# Patient Record
Sex: Male | Born: 1935 | Race: White | Hispanic: No | Smoking: Former smoker
Health system: Southern US, Community
[De-identification: ages and names within clinical notes are randomized; demographics above are authoritative.]

## PROBLEM LIST (undated history)

## (undated) DIAGNOSIS — M1990 Unspecified osteoarthritis, unspecified site: Secondary | ICD-10-CM

## (undated) DIAGNOSIS — E1140 Type 2 diabetes mellitus with diabetic neuropathy, unspecified: Secondary | ICD-10-CM

## (undated) DIAGNOSIS — I509 Heart failure, unspecified: Secondary | ICD-10-CM

## (undated) DIAGNOSIS — N400 Enlarged prostate without lower urinary tract symptoms: Secondary | ICD-10-CM

## (undated) DIAGNOSIS — C4491 Basal cell carcinoma of skin, unspecified: Secondary | ICD-10-CM

## (undated) DIAGNOSIS — E119 Type 2 diabetes mellitus without complications: Secondary | ICD-10-CM

## (undated) DIAGNOSIS — D693 Immune thrombocytopenic purpura: Secondary | ICD-10-CM

## (undated) DIAGNOSIS — I2510 Atherosclerotic heart disease of native coronary artery without angina pectoris: Secondary | ICD-10-CM

## (undated) DIAGNOSIS — I10 Essential (primary) hypertension: Secondary | ICD-10-CM

## (undated) DIAGNOSIS — N189 Chronic kidney disease, unspecified: Secondary | ICD-10-CM

## (undated) DIAGNOSIS — J189 Pneumonia, unspecified organism: Secondary | ICD-10-CM

## (undated) DIAGNOSIS — E785 Hyperlipidemia, unspecified: Secondary | ICD-10-CM

## (undated) HISTORY — DX: Type 2 diabetes mellitus without complications: E11.9

## (undated) HISTORY — DX: Benign prostatic hyperplasia without lower urinary tract symptoms: N40.0

## (undated) HISTORY — PX: TONSILLECTOMY: SUR1361

## (undated) HISTORY — DX: Immune thrombocytopenic purpura: D69.3

## (undated) HISTORY — DX: Chronic kidney disease, unspecified: N18.9

## (undated) HISTORY — DX: Type 2 diabetes mellitus with diabetic neuropathy, unspecified: E11.40

## (undated) HISTORY — DX: Pneumonia, unspecified organism: J18.9

## (undated) HISTORY — PX: CHOLECYSTECTOMY: SHX55

## (undated) HISTORY — DX: Unspecified osteoarthritis, unspecified site: M19.90

## (undated) HISTORY — DX: Heart failure, unspecified: I50.9

## (undated) HISTORY — DX: Essential (primary) hypertension: I10

## (undated) HISTORY — DX: Atherosclerotic heart disease of native coronary artery without angina pectoris: I25.10

## (undated) HISTORY — DX: Basal cell carcinoma of skin, unspecified: C44.91

## (undated) HISTORY — DX: Hyperlipidemia, unspecified: E78.5

---

## 1998-02-25 HISTORY — PX: CORONARY ARTERY BYPASS GRAFT: SHX141

## 2012-02-26 HISTORY — PX: OTHER SURGICAL HISTORY: SHX169

## 2013-05-20 DIAGNOSIS — E785 Hyperlipidemia, unspecified: Secondary | ICD-10-CM | POA: Diagnosis not present

## 2013-05-20 DIAGNOSIS — Z951 Presence of aortocoronary bypass graft: Secondary | ICD-10-CM | POA: Diagnosis not present

## 2013-05-20 DIAGNOSIS — I2510 Atherosclerotic heart disease of native coronary artery without angina pectoris: Secondary | ICD-10-CM | POA: Diagnosis not present

## 2013-05-20 DIAGNOSIS — I10 Essential (primary) hypertension: Secondary | ICD-10-CM | POA: Diagnosis not present

## 2013-05-25 DIAGNOSIS — M1990 Unspecified osteoarthritis, unspecified site: Secondary | ICD-10-CM | POA: Diagnosis not present

## 2013-05-25 DIAGNOSIS — K219 Gastro-esophageal reflux disease without esophagitis: Secondary | ICD-10-CM | POA: Diagnosis not present

## 2013-05-25 DIAGNOSIS — E785 Hyperlipidemia, unspecified: Secondary | ICD-10-CM | POA: Diagnosis not present

## 2013-05-25 DIAGNOSIS — Z951 Presence of aortocoronary bypass graft: Secondary | ICD-10-CM | POA: Diagnosis not present

## 2013-05-25 DIAGNOSIS — I10 Essential (primary) hypertension: Secondary | ICD-10-CM | POA: Diagnosis not present

## 2013-05-25 DIAGNOSIS — I2510 Atherosclerotic heart disease of native coronary artery without angina pectoris: Secondary | ICD-10-CM | POA: Diagnosis not present

## 2013-05-25 DIAGNOSIS — I739 Peripheral vascular disease, unspecified: Secondary | ICD-10-CM | POA: Diagnosis not present

## 2013-05-25 DIAGNOSIS — R0989 Other specified symptoms and signs involving the circulatory and respiratory systems: Secondary | ICD-10-CM | POA: Diagnosis not present

## 2013-05-26 DIAGNOSIS — I129 Hypertensive chronic kidney disease with stage 1 through stage 4 chronic kidney disease, or unspecified chronic kidney disease: Secondary | ICD-10-CM | POA: Diagnosis not present

## 2013-05-26 DIAGNOSIS — D693 Immune thrombocytopenic purpura: Secondary | ICD-10-CM | POA: Diagnosis not present

## 2013-05-26 DIAGNOSIS — I2510 Atherosclerotic heart disease of native coronary artery without angina pectoris: Secondary | ICD-10-CM | POA: Diagnosis not present

## 2013-05-26 DIAGNOSIS — K921 Melena: Secondary | ICD-10-CM | POA: Diagnosis present

## 2013-05-26 DIAGNOSIS — E119 Type 2 diabetes mellitus without complications: Secondary | ICD-10-CM | POA: Diagnosis not present

## 2013-05-26 DIAGNOSIS — D696 Thrombocytopenia, unspecified: Secondary | ICD-10-CM | POA: Diagnosis not present

## 2013-05-26 DIAGNOSIS — R195 Other fecal abnormalities: Secondary | ICD-10-CM | POA: Diagnosis not present

## 2013-05-26 DIAGNOSIS — N189 Chronic kidney disease, unspecified: Secondary | ICD-10-CM | POA: Diagnosis present

## 2013-05-26 DIAGNOSIS — K922 Gastrointestinal hemorrhage, unspecified: Secondary | ICD-10-CM | POA: Diagnosis not present

## 2013-05-26 DIAGNOSIS — Z951 Presence of aortocoronary bypass graft: Secondary | ICD-10-CM | POA: Diagnosis not present

## 2013-05-26 DIAGNOSIS — R5381 Other malaise: Secondary | ICD-10-CM | POA: Diagnosis not present

## 2013-05-26 DIAGNOSIS — M1990 Unspecified osteoarthritis, unspecified site: Secondary | ICD-10-CM | POA: Diagnosis present

## 2013-05-26 DIAGNOSIS — K625 Hemorrhage of anus and rectum: Secondary | ICD-10-CM | POA: Diagnosis not present

## 2013-05-26 DIAGNOSIS — E7800 Pure hypercholesterolemia, unspecified: Secondary | ICD-10-CM | POA: Diagnosis present

## 2013-06-01 DIAGNOSIS — D649 Anemia, unspecified: Secondary | ICD-10-CM | POA: Diagnosis not present

## 2013-06-04 DIAGNOSIS — D649 Anemia, unspecified: Secondary | ICD-10-CM | POA: Diagnosis not present

## 2013-06-08 DIAGNOSIS — D693 Immune thrombocytopenic purpura: Secondary | ICD-10-CM | POA: Diagnosis not present

## 2013-06-08 DIAGNOSIS — D234 Other benign neoplasm of skin of scalp and neck: Secondary | ICD-10-CM | POA: Diagnosis not present

## 2013-06-10 DIAGNOSIS — D696 Thrombocytopenia, unspecified: Secondary | ICD-10-CM | POA: Diagnosis not present

## 2013-06-16 DIAGNOSIS — D696 Thrombocytopenia, unspecified: Secondary | ICD-10-CM | POA: Diagnosis not present

## 2013-06-22 DIAGNOSIS — D696 Thrombocytopenia, unspecified: Secondary | ICD-10-CM | POA: Diagnosis not present

## 2013-06-30 DIAGNOSIS — D696 Thrombocytopenia, unspecified: Secondary | ICD-10-CM | POA: Diagnosis not present

## 2013-07-05 DIAGNOSIS — D696 Thrombocytopenia, unspecified: Secondary | ICD-10-CM | POA: Diagnosis not present

## 2013-07-12 DIAGNOSIS — D696 Thrombocytopenia, unspecified: Secondary | ICD-10-CM | POA: Diagnosis not present

## 2013-07-20 DIAGNOSIS — D696 Thrombocytopenia, unspecified: Secondary | ICD-10-CM | POA: Diagnosis not present

## 2013-07-26 DIAGNOSIS — D696 Thrombocytopenia, unspecified: Secondary | ICD-10-CM | POA: Diagnosis not present

## 2013-07-30 DIAGNOSIS — R609 Edema, unspecified: Secondary | ICD-10-CM | POA: Diagnosis not present

## 2013-07-30 DIAGNOSIS — I70219 Atherosclerosis of native arteries of extremities with intermittent claudication, unspecified extremity: Secondary | ICD-10-CM | POA: Diagnosis not present

## 2013-08-02 DIAGNOSIS — D696 Thrombocytopenia, unspecified: Secondary | ICD-10-CM | POA: Diagnosis not present

## 2013-08-09 DIAGNOSIS — D696 Thrombocytopenia, unspecified: Secondary | ICD-10-CM | POA: Diagnosis not present

## 2013-08-16 DIAGNOSIS — D696 Thrombocytopenia, unspecified: Secondary | ICD-10-CM | POA: Diagnosis not present

## 2013-08-17 DIAGNOSIS — E1129 Type 2 diabetes mellitus with other diabetic kidney complication: Secondary | ICD-10-CM | POA: Diagnosis not present

## 2013-08-17 DIAGNOSIS — E782 Mixed hyperlipidemia: Secondary | ICD-10-CM | POA: Diagnosis not present

## 2013-08-17 DIAGNOSIS — E1169 Type 2 diabetes mellitus with other specified complication: Secondary | ICD-10-CM | POA: Diagnosis not present

## 2013-08-17 DIAGNOSIS — E119 Type 2 diabetes mellitus without complications: Secondary | ICD-10-CM | POA: Diagnosis not present

## 2013-08-17 DIAGNOSIS — D693 Immune thrombocytopenic purpura: Secondary | ICD-10-CM | POA: Diagnosis not present

## 2013-08-23 DIAGNOSIS — D696 Thrombocytopenia, unspecified: Secondary | ICD-10-CM | POA: Diagnosis not present

## 2013-08-30 DIAGNOSIS — D696 Thrombocytopenia, unspecified: Secondary | ICD-10-CM | POA: Diagnosis not present

## 2013-08-30 DIAGNOSIS — Z5111 Encounter for antineoplastic chemotherapy: Secondary | ICD-10-CM | POA: Diagnosis not present

## 2013-08-30 DIAGNOSIS — D693 Immune thrombocytopenic purpura: Secondary | ICD-10-CM | POA: Diagnosis not present

## 2013-09-06 DIAGNOSIS — Z5111 Encounter for antineoplastic chemotherapy: Secondary | ICD-10-CM | POA: Diagnosis not present

## 2013-09-06 DIAGNOSIS — D693 Immune thrombocytopenic purpura: Secondary | ICD-10-CM | POA: Diagnosis not present

## 2013-09-06 DIAGNOSIS — T148XXA Other injury of unspecified body region, initial encounter: Secondary | ICD-10-CM | POA: Diagnosis not present

## 2013-09-06 DIAGNOSIS — D696 Thrombocytopenia, unspecified: Secondary | ICD-10-CM | POA: Diagnosis not present

## 2013-09-13 DIAGNOSIS — D696 Thrombocytopenia, unspecified: Secondary | ICD-10-CM | POA: Diagnosis not present

## 2013-09-13 DIAGNOSIS — D693 Immune thrombocytopenic purpura: Secondary | ICD-10-CM | POA: Diagnosis not present

## 2013-09-13 DIAGNOSIS — Z5111 Encounter for antineoplastic chemotherapy: Secondary | ICD-10-CM | POA: Diagnosis not present

## 2013-09-20 DIAGNOSIS — D696 Thrombocytopenia, unspecified: Secondary | ICD-10-CM | POA: Diagnosis not present

## 2013-09-20 DIAGNOSIS — Z5111 Encounter for antineoplastic chemotherapy: Secondary | ICD-10-CM | POA: Diagnosis not present

## 2013-09-20 DIAGNOSIS — D693 Immune thrombocytopenic purpura: Secondary | ICD-10-CM | POA: Diagnosis not present

## 2013-09-27 DIAGNOSIS — D693 Immune thrombocytopenic purpura: Secondary | ICD-10-CM | POA: Diagnosis not present

## 2013-10-04 DIAGNOSIS — D693 Immune thrombocytopenic purpura: Secondary | ICD-10-CM | POA: Diagnosis not present

## 2013-10-11 DIAGNOSIS — D693 Immune thrombocytopenic purpura: Secondary | ICD-10-CM | POA: Diagnosis not present

## 2013-10-18 DIAGNOSIS — D693 Immune thrombocytopenic purpura: Secondary | ICD-10-CM | POA: Diagnosis not present

## 2013-10-28 DIAGNOSIS — E1149 Type 2 diabetes mellitus with other diabetic neurological complication: Secondary | ICD-10-CM | POA: Diagnosis present

## 2013-10-28 DIAGNOSIS — M1990 Unspecified osteoarthritis, unspecified site: Secondary | ICD-10-CM | POA: Diagnosis present

## 2013-10-28 DIAGNOSIS — E785 Hyperlipidemia, unspecified: Secondary | ICD-10-CM | POA: Diagnosis present

## 2013-10-28 DIAGNOSIS — I2510 Atherosclerotic heart disease of native coronary artery without angina pectoris: Secondary | ICD-10-CM | POA: Diagnosis present

## 2013-10-28 DIAGNOSIS — D693 Immune thrombocytopenic purpura: Secondary | ICD-10-CM | POA: Diagnosis not present

## 2013-10-28 DIAGNOSIS — N400 Enlarged prostate without lower urinary tract symptoms: Secondary | ICD-10-CM | POA: Diagnosis present

## 2013-10-28 DIAGNOSIS — Z96649 Presence of unspecified artificial hip joint: Secondary | ICD-10-CM | POA: Diagnosis not present

## 2013-10-28 DIAGNOSIS — N189 Chronic kidney disease, unspecified: Secondary | ICD-10-CM | POA: Diagnosis present

## 2013-10-28 DIAGNOSIS — N179 Acute kidney failure, unspecified: Secondary | ICD-10-CM | POA: Diagnosis present

## 2013-10-28 DIAGNOSIS — I129 Hypertensive chronic kidney disease with stage 1 through stage 4 chronic kidney disease, or unspecified chronic kidney disease: Secondary | ICD-10-CM | POA: Diagnosis present

## 2013-10-28 DIAGNOSIS — K219 Gastro-esophageal reflux disease without esophagitis: Secondary | ICD-10-CM | POA: Diagnosis present

## 2013-10-28 DIAGNOSIS — E1142 Type 2 diabetes mellitus with diabetic polyneuropathy: Secondary | ICD-10-CM | POA: Diagnosis present

## 2013-11-04 DIAGNOSIS — S0990XA Unspecified injury of head, initial encounter: Secondary | ICD-10-CM | POA: Diagnosis not present

## 2013-11-04 DIAGNOSIS — D693 Immune thrombocytopenic purpura: Secondary | ICD-10-CM | POA: Diagnosis not present

## 2013-11-11 DIAGNOSIS — D693 Immune thrombocytopenic purpura: Secondary | ICD-10-CM | POA: Diagnosis not present

## 2013-11-18 DIAGNOSIS — D693 Immune thrombocytopenic purpura: Secondary | ICD-10-CM | POA: Diagnosis not present

## 2013-11-25 DIAGNOSIS — D693 Immune thrombocytopenic purpura: Secondary | ICD-10-CM | POA: Diagnosis not present

## 2013-12-02 DIAGNOSIS — D693 Immune thrombocytopenic purpura: Secondary | ICD-10-CM | POA: Diagnosis not present

## 2013-12-09 DIAGNOSIS — D693 Immune thrombocytopenic purpura: Secondary | ICD-10-CM | POA: Diagnosis not present

## 2013-12-16 DIAGNOSIS — D693 Immune thrombocytopenic purpura: Secondary | ICD-10-CM | POA: Diagnosis not present

## 2013-12-23 DIAGNOSIS — D693 Immune thrombocytopenic purpura: Secondary | ICD-10-CM | POA: Diagnosis not present

## 2013-12-31 DIAGNOSIS — D693 Immune thrombocytopenic purpura: Secondary | ICD-10-CM | POA: Diagnosis not present

## 2014-01-06 DIAGNOSIS — D693 Immune thrombocytopenic purpura: Secondary | ICD-10-CM | POA: Diagnosis not present

## 2014-01-13 DIAGNOSIS — D693 Immune thrombocytopenic purpura: Secondary | ICD-10-CM | POA: Diagnosis not present

## 2014-01-21 DIAGNOSIS — D693 Immune thrombocytopenic purpura: Secondary | ICD-10-CM | POA: Diagnosis not present

## 2014-01-27 DIAGNOSIS — D693 Immune thrombocytopenic purpura: Secondary | ICD-10-CM | POA: Diagnosis not present

## 2014-02-03 DIAGNOSIS — D693 Immune thrombocytopenic purpura: Secondary | ICD-10-CM | POA: Diagnosis not present

## 2014-02-10 DIAGNOSIS — D693 Immune thrombocytopenic purpura: Secondary | ICD-10-CM | POA: Diagnosis not present

## 2014-02-17 DIAGNOSIS — D693 Immune thrombocytopenic purpura: Secondary | ICD-10-CM | POA: Diagnosis not present

## 2014-02-24 DIAGNOSIS — D696 Thrombocytopenia, unspecified: Secondary | ICD-10-CM | POA: Diagnosis not present

## 2014-02-24 DIAGNOSIS — D693 Immune thrombocytopenic purpura: Secondary | ICD-10-CM | POA: Diagnosis not present

## 2014-03-03 DIAGNOSIS — D696 Thrombocytopenia, unspecified: Secondary | ICD-10-CM | POA: Diagnosis not present

## 2014-03-03 DIAGNOSIS — Z5111 Encounter for antineoplastic chemotherapy: Secondary | ICD-10-CM | POA: Diagnosis not present

## 2014-03-03 DIAGNOSIS — D693 Immune thrombocytopenic purpura: Secondary | ICD-10-CM | POA: Diagnosis not present

## 2014-03-10 DIAGNOSIS — Z5111 Encounter for antineoplastic chemotherapy: Secondary | ICD-10-CM | POA: Diagnosis not present

## 2014-03-10 DIAGNOSIS — D693 Immune thrombocytopenic purpura: Secondary | ICD-10-CM | POA: Diagnosis not present

## 2014-03-10 DIAGNOSIS — D696 Thrombocytopenia, unspecified: Secondary | ICD-10-CM | POA: Diagnosis not present

## 2014-03-21 DIAGNOSIS — D693 Immune thrombocytopenic purpura: Secondary | ICD-10-CM | POA: Diagnosis not present

## 2014-03-28 DIAGNOSIS — D693 Immune thrombocytopenic purpura: Secondary | ICD-10-CM | POA: Diagnosis not present

## 2014-04-04 DIAGNOSIS — D693 Immune thrombocytopenic purpura: Secondary | ICD-10-CM | POA: Diagnosis not present

## 2014-04-12 DIAGNOSIS — D693 Immune thrombocytopenic purpura: Secondary | ICD-10-CM | POA: Diagnosis not present

## 2014-04-18 DIAGNOSIS — D693 Immune thrombocytopenic purpura: Secondary | ICD-10-CM | POA: Diagnosis not present

## 2014-04-25 DIAGNOSIS — D693 Immune thrombocytopenic purpura: Secondary | ICD-10-CM | POA: Diagnosis not present

## 2014-05-02 DIAGNOSIS — D693 Immune thrombocytopenic purpura: Secondary | ICD-10-CM | POA: Diagnosis not present

## 2014-05-09 DIAGNOSIS — D693 Immune thrombocytopenic purpura: Secondary | ICD-10-CM | POA: Diagnosis not present

## 2014-05-16 DIAGNOSIS — D693 Immune thrombocytopenic purpura: Secondary | ICD-10-CM | POA: Diagnosis not present

## 2014-05-23 DIAGNOSIS — D693 Immune thrombocytopenic purpura: Secondary | ICD-10-CM | POA: Diagnosis not present

## 2014-05-30 DIAGNOSIS — Z0000 Encounter for general adult medical examination without abnormal findings: Secondary | ICD-10-CM | POA: Diagnosis not present

## 2014-05-30 DIAGNOSIS — D693 Immune thrombocytopenic purpura: Secondary | ICD-10-CM | POA: Diagnosis not present

## 2014-06-03 DIAGNOSIS — D693 Immune thrombocytopenic purpura: Secondary | ICD-10-CM | POA: Diagnosis not present

## 2014-06-06 DIAGNOSIS — D693 Immune thrombocytopenic purpura: Secondary | ICD-10-CM | POA: Diagnosis not present

## 2014-06-13 DIAGNOSIS — D693 Immune thrombocytopenic purpura: Secondary | ICD-10-CM | POA: Diagnosis not present

## 2014-06-20 DIAGNOSIS — D693 Immune thrombocytopenic purpura: Secondary | ICD-10-CM | POA: Diagnosis not present

## 2014-06-27 DIAGNOSIS — D693 Immune thrombocytopenic purpura: Secondary | ICD-10-CM | POA: Diagnosis not present

## 2014-07-04 DIAGNOSIS — D693 Immune thrombocytopenic purpura: Secondary | ICD-10-CM | POA: Diagnosis not present

## 2014-07-11 DIAGNOSIS — D693 Immune thrombocytopenic purpura: Secondary | ICD-10-CM | POA: Diagnosis not present

## 2014-07-18 DIAGNOSIS — D693 Immune thrombocytopenic purpura: Secondary | ICD-10-CM | POA: Diagnosis not present

## 2014-07-19 DIAGNOSIS — I10 Essential (primary) hypertension: Secondary | ICD-10-CM | POA: Diagnosis not present

## 2014-07-19 DIAGNOSIS — Z951 Presence of aortocoronary bypass graft: Secondary | ICD-10-CM | POA: Diagnosis not present

## 2014-07-19 DIAGNOSIS — R260 Ataxic gait: Secondary | ICD-10-CM | POA: Diagnosis not present

## 2014-07-19 DIAGNOSIS — W1830XA Fall on same level, unspecified, initial encounter: Secondary | ICD-10-CM | POA: Diagnosis not present

## 2014-07-19 DIAGNOSIS — I2510 Atherosclerotic heart disease of native coronary artery without angina pectoris: Secondary | ICD-10-CM | POA: Diagnosis not present

## 2014-07-19 DIAGNOSIS — Y929 Unspecified place or not applicable: Secondary | ICD-10-CM | POA: Diagnosis not present

## 2014-07-19 DIAGNOSIS — N189 Chronic kidney disease, unspecified: Secondary | ICD-10-CM | POA: Diagnosis not present

## 2014-07-19 DIAGNOSIS — S2249XA Multiple fractures of ribs, unspecified side, initial encounter for closed fracture: Secondary | ICD-10-CM | POA: Diagnosis not present

## 2014-07-19 DIAGNOSIS — D696 Thrombocytopenia, unspecified: Secondary | ICD-10-CM | POA: Diagnosis not present

## 2014-07-19 DIAGNOSIS — E785 Hyperlipidemia, unspecified: Secondary | ICD-10-CM | POA: Diagnosis not present

## 2014-07-19 DIAGNOSIS — I129 Hypertensive chronic kidney disease with stage 1 through stage 4 chronic kidney disease, or unspecified chronic kidney disease: Secondary | ICD-10-CM | POA: Diagnosis not present

## 2014-07-19 DIAGNOSIS — R296 Repeated falls: Secondary | ICD-10-CM | POA: Diagnosis not present

## 2014-07-19 DIAGNOSIS — Z87891 Personal history of nicotine dependence: Secondary | ICD-10-CM | POA: Diagnosis not present

## 2014-07-19 DIAGNOSIS — S2239XA Fracture of one rib, unspecified side, initial encounter for closed fracture: Secondary | ICD-10-CM | POA: Diagnosis not present

## 2014-07-19 DIAGNOSIS — K219 Gastro-esophageal reflux disease without esophagitis: Secondary | ICD-10-CM | POA: Diagnosis not present

## 2014-07-19 DIAGNOSIS — R079 Chest pain, unspecified: Secondary | ICD-10-CM | POA: Diagnosis not present

## 2014-07-25 DIAGNOSIS — D693 Immune thrombocytopenic purpura: Secondary | ICD-10-CM | POA: Diagnosis not present

## 2014-08-01 DIAGNOSIS — D693 Immune thrombocytopenic purpura: Secondary | ICD-10-CM | POA: Diagnosis not present

## 2014-08-08 DIAGNOSIS — D693 Immune thrombocytopenic purpura: Secondary | ICD-10-CM | POA: Diagnosis not present

## 2014-08-15 DIAGNOSIS — D693 Immune thrombocytopenic purpura: Secondary | ICD-10-CM | POA: Diagnosis not present

## 2014-08-22 DIAGNOSIS — D693 Immune thrombocytopenic purpura: Secondary | ICD-10-CM | POA: Diagnosis not present

## 2014-08-30 DIAGNOSIS — D693 Immune thrombocytopenic purpura: Secondary | ICD-10-CM | POA: Diagnosis not present

## 2014-09-05 DIAGNOSIS — D693 Immune thrombocytopenic purpura: Secondary | ICD-10-CM | POA: Diagnosis not present

## 2014-09-06 ENCOUNTER — Telehealth: Payer: Self-pay | Admitting: Hematology

## 2014-09-06 DIAGNOSIS — R009 Unspecified abnormalities of heart beat: Secondary | ICD-10-CM | POA: Diagnosis not present

## 2014-09-06 DIAGNOSIS — E785 Hyperlipidemia, unspecified: Secondary | ICD-10-CM | POA: Diagnosis not present

## 2014-09-06 DIAGNOSIS — Z951 Presence of aortocoronary bypass graft: Secondary | ICD-10-CM | POA: Diagnosis not present

## 2014-09-06 DIAGNOSIS — R001 Bradycardia, unspecified: Secondary | ICD-10-CM | POA: Diagnosis not present

## 2014-09-06 DIAGNOSIS — I10 Essential (primary) hypertension: Secondary | ICD-10-CM | POA: Diagnosis not present

## 2014-09-06 DIAGNOSIS — N390 Urinary tract infection, site not specified: Secondary | ICD-10-CM | POA: Diagnosis not present

## 2014-09-06 NOTE — Telephone Encounter (Signed)
new patient appt-s/w patient and gave np appt for 08/02 @ 10:30 w/Dr. Irene Limbo. Patient will move from [place] to [place] in few weeks Dx- Anemia

## 2014-09-08 DIAGNOSIS — I10 Essential (primary) hypertension: Secondary | ICD-10-CM | POA: Diagnosis not present

## 2014-09-08 DIAGNOSIS — N390 Urinary tract infection, site not specified: Secondary | ICD-10-CM | POA: Diagnosis not present

## 2014-09-12 DIAGNOSIS — D693 Immune thrombocytopenic purpura: Secondary | ICD-10-CM | POA: Diagnosis not present

## 2014-09-19 DIAGNOSIS — D693 Immune thrombocytopenic purpura: Secondary | ICD-10-CM | POA: Diagnosis not present

## 2014-09-27 ENCOUNTER — Telehealth: Payer: Self-pay | Admitting: Hematology

## 2014-09-27 ENCOUNTER — Other Ambulatory Visit: Payer: Self-pay | Admitting: *Deleted

## 2014-09-27 ENCOUNTER — Ambulatory Visit (HOSPITAL_BASED_OUTPATIENT_CLINIC_OR_DEPARTMENT_OTHER): Payer: Medicare Other

## 2014-09-27 ENCOUNTER — Ambulatory Visit (HOSPITAL_BASED_OUTPATIENT_CLINIC_OR_DEPARTMENT_OTHER): Payer: Medicare Other | Admitting: Hematology

## 2014-09-27 ENCOUNTER — Encounter: Payer: Self-pay | Admitting: Hematology

## 2014-09-27 ENCOUNTER — Ambulatory Visit: Payer: Medicare Other

## 2014-09-27 VITALS — BP 161/71 | HR 53 | Temp 98.1°F | Resp 19 | Ht 67.5 in | Wt 210.4 lb

## 2014-09-27 DIAGNOSIS — D693 Immune thrombocytopenic purpura: Secondary | ICD-10-CM

## 2014-09-27 LAB — COMPREHENSIVE METABOLIC PANEL (CC13)
ALT: 15 U/L (ref 0–55)
AST: 19 U/L (ref 5–34)
Albumin: 3.5 g/dL (ref 3.5–5.0)
Alkaline Phosphatase: 88 U/L (ref 40–150)
Anion Gap: 6 mEq/L (ref 3–11)
BILIRUBIN TOTAL: 0.35 mg/dL (ref 0.20–1.20)
BUN: 21.2 mg/dL (ref 7.0–26.0)
CHLORIDE: 108 meq/L (ref 98–109)
CO2: 26 mEq/L (ref 22–29)
CREATININE: 1.3 mg/dL (ref 0.7–1.3)
Calcium: 9.3 mg/dL (ref 8.4–10.4)
EGFR: 46 mL/min/{1.73_m2} — ABNORMAL LOW (ref >90)
Glucose: 100 mg/dl (ref 70–140)
POTASSIUM: 4.6 meq/L (ref 3.5–5.1)
SODIUM: 141 meq/L (ref 136–145)
Total Protein: 6.6 g/dL (ref 6.4–8.3)

## 2014-09-27 LAB — CHCC SMEAR

## 2014-09-27 LAB — CBC & DIFF AND RETIC
BASO%: 0.1 % (ref 0.0–2.0)
BASOS ABS: 0 10*3/uL (ref 0.0–0.1)
EOS ABS: 0.1 10*3/uL (ref 0.0–0.5)
EOS%: 1.6 % (ref 0.0–7.0)
HCT: 34.4 % — ABNORMAL LOW (ref 38.4–49.9)
HEMOGLOBIN: 11.2 g/dL — AB (ref 13.0–17.1)
Immature Retic Fract: 18 % — ABNORMAL HIGH (ref 3.00–10.60)
LYMPH%: 21.8 % (ref 14.0–49.0)
MCH: 30.9 pg (ref 27.2–33.4)
MCHC: 32.6 g/dL (ref 32.0–36.0)
MCV: 95 fL (ref 79.3–98.0)
MONO#: 0.9 10*3/uL (ref 0.1–0.9)
MONO%: 12.6 % (ref 0.0–14.0)
NEUT#: 4.4 10*3/uL (ref 1.5–6.5)
NEUT%: 63.9 % (ref 39.0–75.0)
PLATELETS: 100 10*3/uL — AB (ref 140–400)
RBC: 3.62 10*6/uL — AB (ref 4.20–5.82)
RDW: 13.9 % (ref 11.0–14.6)
RETIC CT ABS: 66.97 10*3/uL (ref 34.80–93.90)
Retic %: 1.85 % — ABNORMAL HIGH (ref 0.80–1.80)
WBC: 6.9 10*3/uL (ref 4.0–10.3)
lymph#: 1.5 10*3/uL (ref 0.9–3.3)

## 2014-09-27 LAB — DRAW EXTRA CLOT TUBE

## 2014-09-27 MED ORDER — ROMIPLOSTIM INJECTION 500 MCG
5.0000 ug/kg | Freq: Once | SUBCUTANEOUS | Status: AC
  Administered 2014-09-27: 475 ug via SUBCUTANEOUS
  Filled 2014-09-27: qty 0.95

## 2014-09-27 NOTE — Telephone Encounter (Signed)
Gave and printed appt sched and avs for pt for Aug °

## 2014-09-27 NOTE — Progress Notes (Signed)
Checked in new patient with no issues prior to seeing dr. Has not traveled.

## 2014-09-27 NOTE — Telephone Encounter (Signed)
Pt confirmed labs/inj/ov per 08/02 POF, gave pt AVS and Calendar... KJ

## 2014-09-28 ENCOUNTER — Encounter: Payer: Self-pay | Admitting: Hematology

## 2014-09-28 MED ORDER — CHOLECALCIFEROL 25 MCG (1000 UT) PO TABS: 1000.0000 [IU] | ORAL_TABLET | Freq: Every day | ORAL | Status: DC

## 2014-09-28 NOTE — Progress Notes (Signed)
.    Hematology oncology CONSULT NOTE  Patient Care Team: No Pcp Per Patient as PCP - General (General Practice)  CHIEF COMPLAINTS/PURPOSE OF CONSULTATION:  Transfer of care from Lawrence Warner in [place] for ITP management  HISTORY OF PRESENTING ILLNESS:  Lawrence Warner is a very pleasant 81 year old Caucasian male with a history of CAD status post CABG, hypertension, diabetes, CKD who recently moved to [facility] from [place] and wants to transfer his care for ITP to our cancer center. He was being seen by Dr. Janee Morn at the [facility] recently.  Patient notes that he was diagnosed with ITP in 1989 when he was noted to have thrombocytopenia with platelet counts of 10,000. He notes that he was originally treated with a couple of months of prednisone which helped normalizes platelet counts. He notes that he did not have any issues with thrombocytopenia again until November 2013 when he presented for a right hip replacement and was noted to have platelet counts of 14,000 on preoperative labs. As per patient's report he was transfused 2 units of platelets and taken to surgery which was otherwise uneventful. He apparently did not have any follow-up for his thrombo-cytopenia postoperatively. He subsequently only sought attention in April 2015 when he noted increasing bruisability and blood oozing from skin tears on his upper extremities. He apparently had labs at the time which showed platelet count of 15,000. He was referred to [facility] for admission from the emergency room where he was treated with prednisone 60 mg daily and IVIG with improvement in his counts to 57,000 a matter of days. He was discharged and was subsequently seen by Dr. Arvin Collard hematology at [facility] for his prednisone taper. However in the prednisone was tapered to 20 mg daily his platelet count decreased to 16-23,000. He was started on Nplate 1 mcg/kg weekly but had to be  progressively up titrated to 5 mcg/kg weekly to maintain reasonable platelet counts. His control on Nplate has been quite brittle as per outside records and he drops his platelets rapidly even if he misses just 1 dose.  He has been off prednisone since May 2015. Has never received Rituxan or consideration for splenectomy. He subsequently moved to [place] where he was being seen by Dr. Janee Morn at [facility] and continued to receive Nplate there. He received his last dose in [place] on 09/19/2014.  He has recently moved to [facility] and is establishing his hematology cares with [place]. He notes no petechiae, epistaxis, abdominal pain, GI bleeding, hematuria over the last 1 week. No focal neurological deficits. Had labs today which showed platelet counts of 100,000. He has been started on Nplate in our clinic on 09/27/2014.  He notes that he had a urinary tract infection about a month or so ago when his platelet counts had dropped to 10,000. He notes that his platelet counts have improved since receiving antibiotics for his UTI.  No other acute new concerns at this time. He notes that he has been off aspirin for a long time due to his thrombocytopenia. Notes intermittent anginal pains which have been chronic. No acute angina or shortness of breath at this time.   MEDICAL HISTORY:  Past Medical History  Diagnosis Date  . BPH (benign prostatic hypertrophy)   . Chronic renal insufficiency   . Coronary artery disease     CABG in 2000  . Diabetes type 2, controlled     Diet-controlled  .  Diabetic neuropathy   . Hypertension   . Dyslipidemia   . ITP (idiopathic thrombocytopenic purpura)   . Osteoarthritis     SURGICAL HISTORY: Past Surgical History  Procedure Laterality Date  . Cholecystectomy    . Tonsillectomy    . Right hip replacement  2014  . Coronary artery bypass graft  2000    SOCIAL HISTORY: History   Social History  . Marital Status: Unknown      Spouse Name: N/A  . Number of Children: N/A  . Years of Education: N/A   Occupational History  . Not on file.   Social History Main Topics  . Smoking status: Former Smoker -- 2.00 packs/day for 25 years  . Smokeless tobacco: Not on file  . Alcohol Use: No  . Drug Use: Not on file  . Sexual Activity: Not Currently   Other Topics Concern  . Not on file   Social History Narrative  . No narrative on file   He is living in [place] which is an assisted living facility. One of his son is also moving to [facility] and has a Designer, jewellery in religious studies. Patient notes that his other son is a Systems analyst with multiple awards.  FAMILY HISTORY: History reviewed. No pertinent family history.  ALLERGIES:  is allergic to penicillins.  MEDICATIONS:  Current Outpatient Prescriptions  Medication Sig Dispense Refill  . acetaminophen (TYLENOL) 500 MG tablet Take 500 mg by mouth as needed.    Marland Kitchen losartan (COZAAR) 100 MG tablet Take 100 mg by mouth daily.    . metoprolol tartrate (LOPRESSOR) 25 MG tablet Take 12.5 mg by mouth 2 (two) times daily.    Marland Kitchen omeprazole (PRILOSEC) 20 MG capsule Take 20 mg by mouth daily.    . simvastatin (ZOCOR) 20 MG tablet Take 20 mg by mouth daily.    . Cholecalciferol (VITAMIN D-1000 MAX ST) 1000 UNITS tablet Take 1 tablet (1,000 Units total) by mouth daily.     No current facility-administered medications for this visit.    REVIEW OF SYSTEMS:   10 point review of system is negative except as noted above  PHYSICAL EXAMINATION: ECOG PERFORMANCE STATUS: 1 - Symptomatic but completely ambulatory  Filed Vitals:   09/27/14 1050  BP: 161/71  Pulse: 53  Temp: 98.1 F (36.7 C)  Resp: 19   Filed Weights   09/27/14 1050  Weight: 210 lb 6.4 oz (95.437 kg)    GENERAL: Elderly gentleman in no acute distress:alert and comfortable SKIN: skin color, texture, turgor are normal, no rashes or significant lesions EYES: normal, conjunctiva are  pink and non-injected, sclera clear OROPHARYNX:no exudate, no erythema and lips, buccal mucosa, and tongue normal  NECK: supple, thyroid normal size, non-tender, without nodularity LYMPH:  no palpable lymphadenopathy in the cervical, axillary or inguinal LUNGS: clear to auscultation  HEART: regular rate & rhythm and 2 x 6 systolic murmur over aortic area, and no lower extremity edema ABDOMEN:abdomen soft, non-tender and normal bowel sounds Musculoskeletal:no cyanosis of digits and no clubbing  PSYCH: alert & oriented x 3 with fluent speech NEURO: no focal motor/sensory deficits  LABORATORY DATA:  I have reviewed the data as listed Lab Results  Component Value Date   WBC 6.9 09/27/2014   HGB 11.2* 09/27/2014   HCT 34.4* 09/27/2014   MCV 95.0 09/27/2014   PLT 100* 09/27/2014    Recent Labs  09/27/14 1320  NA 141  K 4.6  CO2 26  GLUCOSE 100  BUN 21.2  CREATININE 1.3  CALCIUM 9.3  PROT 6.6  ALBUMIN 3.5  AST 19  ALT 15  ALKPHOS 68  BILITOT 0.35    ASSESSMENT & PLAN:   81 year old Caucasian male with multiple medical comorbidities with  #1 chronic ITP since 1989. Has previously been responsive to steroids and has received IVIG on one occasion. He has been maintained on 3-44mcg/kg weekly of Nplate since mid 89FGE initially with Dr. Arvin Collard at [facility] and then with Dr. Jimmie Molly at [facility] in [place]. He has recently moved to [place] and would like to continue his treatment here. His platelet counts are 100,000 today with no evidence of bleeding. Plan -Patient was due for his weekly dose of Nplate today and was continued on 18mcg/kg with appropriate parameters for dose adjustment based on platelet counts going ahead. -Will get an ultrasound of the abdomen to check for spleen size. -Will discuss with patient options going ahead including continuing current regimen, considering switching to Promacta, considering pros and cons of Rituxan. Patient with his  significant comorbidities and age might not be a good candidate for an operative splenectomy but if absolutely needed might be considered for a splenic embolization. -We cannot: Steroid refractory at this time and short-term steroids might still be an option if required. -We'll check for Helicobacter pylori serology and treated positive. Return to clinic in 2 weeks and weekly for labs and Nplate.  Other medical comorbidities -Patient was given a referral to set up a primary care physician here in [place].  Total time spent 45 minutes more than 50% time on direct patient contact counseling and coordination of care.  Sullivan Lone MD [place] Hematology/Oncology Physician [facility]  (Office):       [phone] (Work cell):  [phone] (Fax):           [phone]

## 2014-09-29 ENCOUNTER — Ambulatory Visit (HOSPITAL_COMMUNITY): Payer: Medicare Other

## 2014-10-04 ENCOUNTER — Ambulatory Visit (HOSPITAL_BASED_OUTPATIENT_CLINIC_OR_DEPARTMENT_OTHER): Payer: Medicare Other

## 2014-10-04 ENCOUNTER — Other Ambulatory Visit: Payer: Self-pay | Admitting: *Deleted

## 2014-10-04 ENCOUNTER — Telehealth: Payer: Self-pay | Admitting: Hematology

## 2014-10-04 ENCOUNTER — Other Ambulatory Visit: Payer: Self-pay | Admitting: Hematology

## 2014-10-04 ENCOUNTER — Other Ambulatory Visit (HOSPITAL_BASED_OUTPATIENT_CLINIC_OR_DEPARTMENT_OTHER): Payer: Medicare Other

## 2014-10-04 VITALS — BP 136/42 | HR 47 | Temp 97.9°F

## 2014-10-04 DIAGNOSIS — D693 Immune thrombocytopenic purpura: Secondary | ICD-10-CM

## 2014-10-04 LAB — CBC WITH DIFFERENTIAL/PLATELET
BASO%: 0.4 % (ref 0.0–2.0)
Basophils Absolute: 0 10*3/uL (ref 0.0–0.1)
EOS ABS: 0.1 10*3/uL (ref 0.0–0.5)
EOS%: 1.5 % (ref 0.0–7.0)
HCT: 33.3 % — ABNORMAL LOW (ref 38.4–49.9)
HGB: 10.8 g/dL — ABNORMAL LOW (ref 13.0–17.1)
LYMPH%: 18.3 % (ref 14.0–49.0)
MCH: 30.7 pg (ref 27.2–33.4)
MCHC: 32.3 g/dL (ref 32.0–36.0)
MCV: 94.8 fL (ref 79.3–98.0)
MONO#: 0.9 10*3/uL (ref 0.1–0.9)
MONO%: 13.3 % (ref 0.0–14.0)
NEUT%: 66.5 % (ref 39.0–75.0)
NEUTROS ABS: 4.3 10*3/uL (ref 1.5–6.5)
Platelets: 38 10*3/uL — ABNORMAL LOW (ref 140–400)
RBC: 3.51 10*6/uL — ABNORMAL LOW (ref 4.20–5.82)
RDW: 14.1 % (ref 11.0–14.6)
WBC: 6.4 10*3/uL (ref 4.0–10.3)
lymph#: 1.2 10*3/uL (ref 0.9–3.3)

## 2014-10-04 MED ORDER — ROMIPLOSTIM INJECTION 500 MCG
5.2500 ug/kg | Freq: Once | SUBCUTANEOUS | Status: AC
  Administered 2014-10-04: 500 ug via SUBCUTANEOUS
  Filled 2014-10-04: qty 1

## 2014-10-06 ENCOUNTER — Ambulatory Visit (HOSPITAL_COMMUNITY)
Admission: RE | Admit: 2014-10-06 | Discharge: 2014-10-06 | Disposition: A | Discharge status: Home / Self Care | Payer: Medicare Other | Source: Ambulatory Visit | Attending: Hematology | Admitting: Hematology

## 2014-10-06 DIAGNOSIS — D696 Thrombocytopenia, unspecified: Secondary | ICD-10-CM | POA: Diagnosis present

## 2014-10-06 DIAGNOSIS — N281 Cyst of kidney, acquired: Secondary | ICD-10-CM | POA: Insufficient documentation

## 2014-10-06 DIAGNOSIS — Z9889 Other specified postprocedural states: Secondary | ICD-10-CM | POA: Diagnosis not present

## 2014-10-06 DIAGNOSIS — D693 Immune thrombocytopenic purpura: Secondary | ICD-10-CM

## 2014-10-11 ENCOUNTER — Ambulatory Visit (HOSPITAL_BASED_OUTPATIENT_CLINIC_OR_DEPARTMENT_OTHER): Payer: Medicare Other | Admitting: Hematology

## 2014-10-11 ENCOUNTER — Ambulatory Visit (HOSPITAL_BASED_OUTPATIENT_CLINIC_OR_DEPARTMENT_OTHER): Payer: Medicare Other

## 2014-10-11 ENCOUNTER — Encounter: Payer: Self-pay | Admitting: Hematology

## 2014-10-11 ENCOUNTER — Other Ambulatory Visit (HOSPITAL_BASED_OUTPATIENT_CLINIC_OR_DEPARTMENT_OTHER): Payer: Medicare Other

## 2014-10-11 VITALS — BP 185/65 | HR 56 | Temp 97.0°F | Resp 18 | Ht 67.5 in | Wt 208.9 lb

## 2014-10-11 DIAGNOSIS — D693 Immune thrombocytopenic purpura: Secondary | ICD-10-CM

## 2014-10-11 LAB — CBC WITH DIFFERENTIAL/PLATELET
BASO%: 0.2 % (ref 0.0–2.0)
Basophils Absolute: 0 10*3/uL (ref 0.0–0.1)
EOS%: 1.9 % (ref 0.0–7.0)
Eosinophils Absolute: 0.1 10*3/uL (ref 0.0–0.5)
HCT: 34.1 % — ABNORMAL LOW (ref 38.4–49.9)
HGB: 11 g/dL — ABNORMAL LOW (ref 13.0–17.1)
LYMPH#: 1.1 10*3/uL (ref 0.9–3.3)
LYMPH%: 16.4 % (ref 14.0–49.0)
MCH: 30.6 pg (ref 27.2–33.4)
MCHC: 32.2 g/dL (ref 32.0–36.0)
MCV: 94.9 fL (ref 79.3–98.0)
MONO#: 0.8 10*3/uL (ref 0.1–0.9)
MONO%: 12.1 % (ref 0.0–14.0)
NEUT#: 4.8 10*3/uL (ref 1.5–6.5)
NEUT%: 69.4 % (ref 39.0–75.0)
Platelets: 147 10*3/uL (ref 140–400)
RBC: 3.6 10*6/uL — ABNORMAL LOW (ref 4.20–5.82)
RDW: 13.9 % (ref 11.0–14.6)
WBC: 7 10*3/uL (ref 4.0–10.3)

## 2014-10-11 LAB — COMPREHENSIVE METABOLIC PANEL (CC13)
ALT: 15 U/L (ref 0–55)
AST: 18 U/L (ref 5–34)
Albumin: 3.3 g/dL — ABNORMAL LOW (ref 3.5–5.0)
Alkaline Phosphatase: 79 U/L (ref 40–150)
Anion Gap: 8 mEq/L (ref 3–11)
BUN: 21.6 mg/dL (ref 7.0–26.0)
CHLORIDE: 109 meq/L (ref 98–109)
CO2: 24 mEq/L (ref 22–29)
CREATININE: 1.5 mg/dL — AB (ref 0.7–1.3)
Calcium: 8.7 mg/dL (ref 8.4–10.4)
EGFR: 39 mL/min/{1.73_m2} — ABNORMAL LOW (ref >90)
Glucose: 124 mg/dl (ref 70–140)
Potassium: 4.5 mEq/L (ref 3.5–5.1)
SODIUM: 141 meq/L (ref 136–145)
Total Bilirubin: 0.39 mg/dL (ref 0.20–1.20)
Total Protein: 6.2 g/dL — ABNORMAL LOW (ref 6.4–8.3)

## 2014-10-11 MED ORDER — ROMIPLOSTIM INJECTION 500 MCG
5.3000 ug/kg | Freq: Once | SUBCUTANEOUS | Status: AC
  Administered 2014-10-11: 500 ug via SUBCUTANEOUS
  Filled 2014-10-11: qty 1

## 2014-10-12 NOTE — Progress Notes (Signed)
.    Hematology oncology clinic note  Date of service: 10/11/2014  Patient Care Team: No Pcp Per Patient as PCP - General (General Practice)  CHIEF COMPLAINTS/PURPOSE OF CONSULTATION: Follow-up for ITP   Diagnosis: Idiopathic thrombocytopenic purpura   Current treatment: Nplate weekly. Needing about 5 mcg/kg to maintain reasonable platelet counts  Previous treatment: Steroids, IVIG.  HISTORY OF PRESENTING ILLNESS:  please see my previous clinic note from 09/28/2014 for details of initial presentation and course of treatment.  Interval history  Patient is here for follow-up regarding his ITP. Notes no issues with significant bleeding, petechiae, epistaxis, overt GI bleed or other bleeding. Has been getting his Romiplostim weekly with no acute new concerns. Generalized fatigue that has been present for years. He notes that he is settling down well and [place] and likes the place thus far. Had an ultrasound of his abdomen that did not show any overt splenomegaly. We discussed treatment options and at this point he would like to continue the current plan of care and not "rock the boat much". I give him a referral to set up a primary care physician since he has other comorbid's that would need ongoing management. No other acute new concerns.   MEDICAL HISTORY:  Past Medical History  Diagnosis Date  . BPH (benign prostatic hypertrophy)   . Chronic renal insufficiency   . Coronary artery disease     CABG in 2000  . Diabetes type 2, controlled     Diet-controlled  . Diabetic neuropathy   . Hypertension   . Dyslipidemia   . ITP (idiopathic thrombocytopenic purpura)   . Osteoarthritis     SURGICAL HISTORY: Past Surgical History  Procedure Laterality Date  . Cholecystectomy    . Tonsillectomy    . Right hip replacement  2014  . Coronary artery bypass graft  2000    SOCIAL HISTORY: Social History   Social History  . Marital Status: Unknown    Spouse Name: N/A  . Number  of Children: N/A  . Years of Education: N/A   Occupational History  . Not on file.   Social History Main Topics  . Smoking status: Former Smoker -- 2.00 packs/day for 25 years  . Smokeless tobacco: Not on file  . Alcohol Use: No  . Drug Use: Not on file  . Sexual Activity: Not Currently   Other Topics Concern  . Not on file   Social History Narrative   He is living in [place] which is an assisted living facility. One of his son is also moving to [facility] and has a Designer, jewellery in religious studies. Patient notes that his other son is a Systems analyst with multiple awards.  FAMILY HISTORY: History reviewed. No pertinent family history.  ALLERGIES:  is allergic to penicillins.  MEDICATIONS:  Current Outpatient Prescriptions  Medication Sig Dispense Refill  . acetaminophen (TYLENOL) 500 MG tablet Take 500 mg by mouth as needed.    . Cholecalciferol (VITAMIN D-1000 MAX ST) 1000 UNITS tablet Take 1 tablet (1,000 Units total) by mouth daily.    Marland Kitchen losartan (COZAAR) 100 MG tablet Take 100 mg by mouth daily.    . metoprolol tartrate (LOPRESSOR) 25 MG tablet Take 6.25 mg by mouth 2 (two) times daily.     Marland Kitchen omeprazole (PRILOSEC) 20 MG capsule Take 20 mg by mouth daily.    . simvastatin (ZOCOR) 20 MG tablet Take 20 mg by mouth daily.     No current facility-administered medications for this  visit.   Romiplostim 5 mcg/kg weekly   REVIEW OF SYSTEMS:   10 point review of system is negative except as noted above  PHYSICAL EXAMINATION: ECOG PERFORMANCE STATUS: 1 - Symptomatic but completely ambulatory  Filed Vitals:   10/11/14 0942  BP: 185/65  Pulse: 56  Temp: 97 F (36.1 C)  Resp: 18   Filed Weights   10/11/14 0942  Weight: 208 lb 14.4 oz (94.756 kg)   GENERAL: Elderly gentleman in no acute distress:alert and comfortable SKIN: skin color, texture, turgor are normal, no rashes or significant lesions EYES: normal, conjunctiva are pink and non-injected, sclera  clear OROPHARYNX:no exudate, no erythema and lips, buccal mucosa, and tongue normal  NECK: supple, thyroid normal size, non-tender, without nodularity LYMPH:  no palpable lymphadenopathy in the cervical, axillary or inguinal LUNGS: clear to auscultation  HEART: regular rate & rhythm and 2 x 6 systolic murmur over aortic area, and no lower extremity edema ABDOMEN:abdomen soft, non-tender and normal bowel sounds Musculoskeletal:no cyanosis of digits and no clubbing  PSYCH: alert & oriented x 3 with fluent speech NEURO: no focal motor/sensory deficits  LABORATORY DATA:  I have reviewed the data as listed Lab Results  Component Value Date   WBC 7.0 10/11/2014   HGB 11.0* 10/11/2014   HCT 34.1* 10/11/2014   MCV 94.9 10/11/2014   PLT 147 10/11/2014   . CBC Latest Ref Rng 10/11/2014 10/04/2014 09/27/2014  WBC 4.0 - 10.3 10e3/uL 7.0 6.4 6.9  Hemoglobin 13.0 - 17.1 g/dL 11.0(L) 10.8(L) 11.2(L)  Hematocrit 38.4 - 49.9 % 34.1(L) 33.3(L) 34.4(L)  Platelets 140 - 400 10e3/uL 147 38 few large plts.(L) 100(L)    . CMP Latest Ref Rng 10/11/2014 09/27/2014  Glucose 70 - 140 mg/dl 124 100  BUN 7.0 - 26.0 mg/dL 21.6 21.2  Creatinine 0.7 - 1.3 mg/dL 1.5(H) 1.3  Sodium 136 - 145 mEq/L 141 141  Potassium 3.5 - 5.1 mEq/L 4.5 4.6  CO2 22 - 29 mEq/L 24 26  Calcium 8.4 - 10.4 mg/dL 8.7 9.3  Total Protein 6.4 - 8.3 g/dL 6.2(L) 6.6  Total Bilirubin 0.20 - 1.20 mg/dL 0.39 0.35  Alkaline Phos 40 - 150 U/L 79 88  AST 5 - 34 U/L 18 19  ALT 0 - 55 U/L 15 15   [place] abd 10/12/2014:  FINDINGS: Gallbladder: Cholecystectomy .  Common bile duct: Diameter: 6.5 mm  Liver: No focal lesion identified. Within normal limits in parenchymal echogenicity.  IVC: No abnormality visualized.  Pancreas: Visualized portion unremarkable.  Spleen: Size and appearance within normal limits.  Right Kidney: Length: 10.1 cm. Cortical thinning. No hydronephrosis visualized. 8 mm simple cyst.  Left Kidney: Length: 12.1  cm. Cortical thinning. No hydronephrosis visualized. 1.2 cm simple cysts.  Abdominal aorta: No aneurysm visualized.  Other findings: None.  IMPRESSION: 1. Cholecystectomy. No biliary distention.  2. Bilateral renal cortical thinning. Simple tiny bilateral renal Cysts.    ASSESSMENT & PLAN:   81 year old Caucasian male with multiple medical comorbidities with  #1 chronic ITP since 1989. Has previously been responsive to steroids and has received IVIG on one occasion. He has been maintained on 3-28mcg/kg weekly of Nplate since mid 89FGE initially with Dr. Arvin Collard at [facility] and then with Dr. Jimmie Molly at [facility] in [place]. He has recently moved to [facility] and would like to continue his treatment hereplatelet counts a fluctuating from 38,000  To 147,000 today. No issues with bleeding . Ultrasound abdomen showed normal spleen size Plan  -  Continue Romiplostim 36mcg/kg with appropriate parameters for dose adjustment based on platelet counts going ahead. -Patient is not keen to change line of treatment at this time which is reasonable. -We'll check SPEP and Helicobacter pylori antibody to complete workup. -Would treat Helicobacter pylori if IgG positive.  #2 other medical comorbidities -Patient has not set up a primary care physician yet -he was given another referral to establish cares with a primary care physician for ongoing management of his chronic medical comorbidities.  Total time spent 20 minutes more than 50% time on direct patient contact counseling and coordination of care.  Return to care with Dr. Irene Limbo in 4 weeks Continue follow-up for weekly Romiplostim and labs.   Sullivan Lone MD [place] Hematology/Oncology Physician [facility]  (Office):       [phone] (Work cell):  [phone] (Fax):           [phone]

## 2014-10-18 ENCOUNTER — Ambulatory Visit: Payer: Medicare Other

## 2014-10-18 ENCOUNTER — Ambulatory Visit (HOSPITAL_BASED_OUTPATIENT_CLINIC_OR_DEPARTMENT_OTHER): Payer: Medicare Other

## 2014-10-18 DIAGNOSIS — D693 Immune thrombocytopenic purpura: Secondary | ICD-10-CM

## 2014-10-18 LAB — CBC WITH DIFFERENTIAL/PLATELET
BASO%: 0.5 % (ref 0.0–2.0)
Basophils Absolute: 0 10*3/uL (ref 0.0–0.1)
EOS ABS: 0.1 10*3/uL (ref 0.0–0.5)
EOS%: 1.1 % (ref 0.0–7.0)
HEMATOCRIT: 35.3 % — AB (ref 38.4–49.9)
HGB: 11.4 g/dL — ABNORMAL LOW (ref 13.0–17.1)
LYMPH#: 1.4 10*3/uL (ref 0.9–3.3)
LYMPH%: 14.4 % (ref 14.0–49.0)
MCH: 31 pg (ref 27.2–33.4)
MCHC: 32.5 g/dL (ref 32.0–36.0)
MCV: 95.3 fL (ref 79.3–98.0)
MONO#: 1.2 10*3/uL — AB (ref 0.1–0.9)
MONO%: 11.6 % (ref 0.0–14.0)
NEUT%: 72.4 % (ref 39.0–75.0)
NEUTROS ABS: 7.2 10*3/uL — AB (ref 1.5–6.5)
PLATELETS: 412 10*3/uL — AB (ref 140–400)
RBC: 3.7 10*6/uL — AB (ref 4.20–5.82)
RDW: 13.7 % (ref 11.0–14.6)
WBC: 9.9 10*3/uL (ref 4.0–10.3)

## 2014-10-18 MED ORDER — ROMIPLOSTIM INJECTION 500 MCG: 5.0000 ug/kg | Freq: Once | SUBCUTANEOUS | Status: DC

## 2014-10-18 NOTE — Progress Notes (Signed)
PLTC 412 today   Does not need N-plate injection today   Will come back as scheduled 10/25/14.

## 2014-10-18 NOTE — Patient Instructions (Signed)

## 2014-10-20 LAB — SPEP & IFE WITH QIG
ALPHA-1-GLOBULIN: 0.3 g/dL (ref 0.2–0.3)
ALPHA-2-GLOBULIN: 0.8 g/dL (ref 0.5–0.9)
Albumin ELP: 3.6 g/dL — ABNORMAL LOW (ref 3.8–4.8)
BETA GLOBULIN: 0.5 g/dL (ref 0.4–0.6)
Beta 2: 0.4 g/dL (ref 0.2–0.5)
GAMMA GLOBULIN: 1 g/dL (ref 0.8–1.7)
IGM, SERUM: 80 mg/dL (ref 41–251)
IgA: 268 mg/dL (ref 68–379)
IgG (Immunoglobin G), Serum: 990 mg/dL (ref 650–1600)
Total Protein, Serum Electrophoresis: 6.5 g/dL (ref 6.1–8.1)

## 2014-10-24 ENCOUNTER — Other Ambulatory Visit: Payer: Self-pay | Admitting: Hematology

## 2014-10-25 ENCOUNTER — Ambulatory Visit (HOSPITAL_BASED_OUTPATIENT_CLINIC_OR_DEPARTMENT_OTHER): Payer: Medicare Other

## 2014-10-25 ENCOUNTER — Other Ambulatory Visit (HOSPITAL_BASED_OUTPATIENT_CLINIC_OR_DEPARTMENT_OTHER): Payer: Medicare Other

## 2014-10-25 VITALS — BP 174/39 | HR 53 | Temp 97.6°F

## 2014-10-25 DIAGNOSIS — D693 Immune thrombocytopenic purpura: Secondary | ICD-10-CM

## 2014-10-25 LAB — CBC WITH DIFFERENTIAL/PLATELET
BASO%: 0.7 % (ref 0.0–2.0)
BASOS ABS: 0 10*3/uL (ref 0.0–0.1)
EOS ABS: 0.1 10*3/uL (ref 0.0–0.5)
EOS%: 1 % (ref 0.0–7.0)
HEMATOCRIT: 37.2 % — AB (ref 38.4–49.9)
HEMOGLOBIN: 12.1 g/dL — AB (ref 13.0–17.1)
LYMPH#: 1.2 10*3/uL (ref 0.9–3.3)
LYMPH%: 17.7 % (ref 14.0–49.0)
MCH: 30.7 pg (ref 27.2–33.4)
MCHC: 32.5 g/dL (ref 32.0–36.0)
MCV: 94.6 fL (ref 79.3–98.0)
MONO#: 0.7 10*3/uL (ref 0.1–0.9)
MONO%: 11 % (ref 0.0–14.0)
NEUT#: 4.5 10*3/uL (ref 1.5–6.5)
NEUT%: 69.6 % (ref 39.0–75.0)
PLATELETS: 312 10*3/uL (ref 140–400)
RBC: 3.93 10*6/uL — ABNORMAL LOW (ref 4.20–5.82)
RDW: 14 % (ref 11.0–14.6)
WBC: 6.5 10*3/uL (ref 4.0–10.3)

## 2014-10-25 MED ORDER — ROMIPLOSTIM 250 MCG ~~LOC~~ SOLR
285.0000 ug | SUBCUTANEOUS | Status: DC
  Administered 2014-10-25: 285 ug via SUBCUTANEOUS
  Filled 2014-10-25: qty 0.57

## 2014-11-01 ENCOUNTER — Other Ambulatory Visit (HOSPITAL_BASED_OUTPATIENT_CLINIC_OR_DEPARTMENT_OTHER): Payer: Medicare Other

## 2014-11-01 ENCOUNTER — Ambulatory Visit (HOSPITAL_BASED_OUTPATIENT_CLINIC_OR_DEPARTMENT_OTHER): Payer: Medicare Other

## 2014-11-01 VITALS — BP 175/54 | HR 50 | Temp 97.3°F

## 2014-11-01 DIAGNOSIS — D693 Immune thrombocytopenic purpura: Secondary | ICD-10-CM | POA: Diagnosis present

## 2014-11-01 LAB — CBC WITH DIFFERENTIAL/PLATELET
BASO%: 0.5 % (ref 0.0–2.0)
BASOS ABS: 0 10*3/uL (ref 0.0–0.1)
EOS ABS: 0.1 10*3/uL (ref 0.0–0.5)
EOS%: 1.4 % (ref 0.0–7.0)
HCT: 35.9 % — ABNORMAL LOW (ref 38.4–49.9)
HEMOGLOBIN: 11.7 g/dL — AB (ref 13.0–17.1)
LYMPH%: 20.1 % (ref 14.0–49.0)
MCH: 30.6 pg (ref 27.2–33.4)
MCHC: 32.4 g/dL (ref 32.0–36.0)
MCV: 94.4 fL (ref 79.3–98.0)
MONO#: 0.6 10*3/uL (ref 0.1–0.9)
MONO%: 10.5 % (ref 0.0–14.0)
NEUT#: 3.8 10*3/uL (ref 1.5–6.5)
NEUT%: 67.5 % (ref 39.0–75.0)
Platelets: 137 10*3/uL — ABNORMAL LOW (ref 140–400)
RBC: 3.81 10*6/uL — AB (ref 4.20–5.82)
RDW: 13.3 % (ref 11.0–14.6)
WBC: 5.7 10*3/uL (ref 4.0–10.3)
lymph#: 1.1 10*3/uL (ref 0.9–3.3)

## 2014-11-01 MED ORDER — ROMIPLOSTIM 250 MCG ~~LOC~~ SOLR
285.0000 ug | SUBCUTANEOUS | Status: DC
  Administered 2014-11-01: 285 ug via SUBCUTANEOUS
  Filled 2014-11-01: qty 0.57

## 2014-11-04 ENCOUNTER — Emergency Department (HOSPITAL_COMMUNITY)
Admission: EM | Admit: 2014-11-04 | Discharge: 2014-11-04 | Disposition: A | Discharge status: Home / Self Care | Payer: Medicare Other | Attending: Emergency Medicine | Admitting: Emergency Medicine

## 2014-11-04 ENCOUNTER — Emergency Department (EMERGENCY_DEPARTMENT_HOSPITAL): Payer: Medicare Other

## 2014-11-04 ENCOUNTER — Encounter (HOSPITAL_COMMUNITY): Payer: Self-pay

## 2014-11-04 DIAGNOSIS — I2510 Atherosclerotic heart disease of native coronary artery without angina pectoris: Secondary | ICD-10-CM | POA: Insufficient documentation

## 2014-11-04 DIAGNOSIS — Z951 Presence of aortocoronary bypass graft: Secondary | ICD-10-CM | POA: Insufficient documentation

## 2014-11-04 DIAGNOSIS — L03116 Cellulitis of left lower limb: Secondary | ICD-10-CM | POA: Diagnosis not present

## 2014-11-04 DIAGNOSIS — M1990 Unspecified osteoarthritis, unspecified site: Secondary | ICD-10-CM | POA: Diagnosis not present

## 2014-11-04 DIAGNOSIS — Z862 Personal history of diseases of the blood and blood-forming organs and certain disorders involving the immune mechanism: Secondary | ICD-10-CM | POA: Insufficient documentation

## 2014-11-04 DIAGNOSIS — Z87891 Personal history of nicotine dependence: Secondary | ICD-10-CM | POA: Diagnosis not present

## 2014-11-04 DIAGNOSIS — M79662 Pain in left lower leg: Secondary | ICD-10-CM | POA: Diagnosis present

## 2014-11-04 DIAGNOSIS — E785 Hyperlipidemia, unspecified: Secondary | ICD-10-CM | POA: Diagnosis not present

## 2014-11-04 DIAGNOSIS — R52 Pain, unspecified: Secondary | ICD-10-CM

## 2014-11-04 DIAGNOSIS — Z79899 Other long term (current) drug therapy: Secondary | ICD-10-CM | POA: Insufficient documentation

## 2014-11-04 DIAGNOSIS — Z880 Allergy status to penicillin: Secondary | ICD-10-CM | POA: Insufficient documentation

## 2014-11-04 DIAGNOSIS — M79672 Pain in left foot: Secondary | ICD-10-CM | POA: Diagnosis not present

## 2014-11-04 DIAGNOSIS — I10 Essential (primary) hypertension: Secondary | ICD-10-CM | POA: Diagnosis not present

## 2014-11-04 DIAGNOSIS — E119 Type 2 diabetes mellitus without complications: Secondary | ICD-10-CM | POA: Insufficient documentation

## 2014-11-04 LAB — CBC WITH DIFFERENTIAL/PLATELET
Basophils Absolute: 0 10*3/uL (ref 0.0–0.1)
Basophils Relative: 0 % (ref 0–1)
Eosinophils Absolute: 0.1 10*3/uL (ref 0.0–0.7)
Eosinophils Relative: 1 % (ref 0–5)
HCT: 36.5 % — ABNORMAL LOW (ref 39.0–52.0)
Hemoglobin: 12 g/dL — ABNORMAL LOW (ref 13.0–17.0)
Lymphocytes Relative: 23 % (ref 12–46)
Lymphs Abs: 1.6 10*3/uL (ref 0.7–4.0)
MCH: 31.3 pg (ref 26.0–34.0)
MCHC: 32.9 g/dL (ref 30.0–36.0)
MCV: 95.3 fL (ref 78.0–100.0)
Monocytes Absolute: 0.7 10*3/uL (ref 0.1–1.0)
Monocytes Relative: 10 % (ref 3–12)
Neutro Abs: 4.6 10*3/uL (ref 1.7–7.7)
Neutrophils Relative %: 66 % (ref 43–77)
Platelets: 134 10*3/uL — ABNORMAL LOW (ref 150–400)
RBC: 3.83 MIL/uL — ABNORMAL LOW (ref 4.22–5.81)
RDW: 13 % (ref 11.5–15.5)
WBC: 7 10*3/uL (ref 4.0–10.5)

## 2014-11-04 LAB — BASIC METABOLIC PANEL
Anion gap: 7 (ref 5–15)
BUN: 21 mg/dL — ABNORMAL HIGH (ref 6–20)
CO2: 26 mmol/L (ref 22–32)
Calcium: 9.2 mg/dL (ref 8.9–10.3)
Chloride: 105 mmol/L (ref 101–111)
Creatinine, Ser: 1.45 mg/dL — ABNORMAL HIGH (ref 0.61–1.24)
GFR calc Af Amer: 46 mL/min — ABNORMAL LOW (ref >60)
GFR calc non Af Amer: 40 mL/min — ABNORMAL LOW (ref >60)
Glucose, Bld: 136 mg/dL — ABNORMAL HIGH (ref 65–99)
Potassium: 4.7 mmol/L (ref 3.5–5.1)
Sodium: 138 mmol/L (ref 135–145)

## 2014-11-04 MED ORDER — OXYCODONE-ACETAMINOPHEN 5-325 MG PO TABS: 1.0000 | ORAL_TABLET | ORAL | Status: DC | PRN

## 2014-11-04 MED ORDER — OXYCODONE-ACETAMINOPHEN 5-325 MG PO TABS
2.0000 | ORAL_TABLET | Freq: Once | ORAL | Status: AC
  Administered 2014-11-04: 2 via ORAL
  Filled 2014-11-04: qty 2

## 2014-11-04 MED ORDER — SULFAMETHOXAZOLE-TRIMETHOPRIM 800-160 MG PO TABS
1.0000 | ORAL_TABLET | Freq: Once | ORAL | Status: AC
  Administered 2014-11-04: 1 via ORAL
  Filled 2014-11-04: qty 1

## 2014-11-04 MED ORDER — SULFAMETHOXAZOLE-TRIMETHOPRIM 800-160 MG PO TABS: 1.0000 | ORAL_TABLET | Freq: Two times a day (BID) | ORAL | Status: AC

## 2014-11-04 NOTE — Progress Notes (Signed)
VASCULAR LAB PRELIMINARY  PRELIMINARY  PRELIMINARY  PRELIMINARY  Left lower extremity venous duplex completed.    Preliminary report:  Left:  No evidence of DVT, superficial thrombosis, or Baker's cyst.  Maanav Kassabian, RVT 11/04/2014, 12:53 PM

## 2014-11-04 NOTE — ED Notes (Signed)
Pt reports increased increased l/calf pain. Stated that heart rate of 50 is normal

## 2014-11-04 NOTE — ED Provider Notes (Signed)
CSN: [id number]     Arrival date & time 11/04/14  0940 History   First MD Initiated Contact with Patient 11/04/14 1030     Chief Complaint  Patient presents with  . Ankle Pain  . Leg Pain     (Consider location/radiation/quality/duration/timing/severity/associated sxs/prior Treatment) HPI    81 year old male with atraumatic left foot pain. Onset last night and slowly worsening. Pain at rest and increase with movement and ambulation. No rash. No fevers or chills. No swelling. No history of DVT/PE. No acute numbness or tingling. Pt has a past hx of ITP and now followed at cancer center. Currently being treated with romiplostin. Most recent platelets count was 137,000 on 9/6. Denies past hx of DVT/PE. No respiratory complaints.   Past Medical History  Diagnosis Date  . BPH (benign prostatic hypertrophy)   . Chronic renal insufficiency   . Coronary artery disease     CABG in 2000  . Diabetes type 2, controlled     Diet-controlled  . Diabetic neuropathy   . Hypertension   . Dyslipidemia   . ITP (idiopathic thrombocytopenic purpura)   . Osteoarthritis    Past Surgical History  Procedure Laterality Date  . Cholecystectomy    . Tonsillectomy    . Right hip replacement  2014  . Coronary artery bypass graft  2000   History reviewed. No pertinent family history. Social History  Substance Use Topics  . Smoking status: Former Smoker -- 2.00 packs/day for 25 years  . Smokeless tobacco: None  . Alcohol Use: No    Review of Systems  All systems reviewed and negative, other than as noted in HPI.   Allergies  Penicillins  Home Medications   Prior to Admission medications   Medication Sig Start Date End Date Taking? Authorizing Provider  acetaminophen (TYLENOL) 500 MG tablet Take 500 mg by mouth as needed.    Historical Provider, MD  Cholecalciferol (VITAMIN D-1000 MAX ST) 1000 UNITS tablet Take 1 tablet (1,000 Units total) by mouth daily. 09/28/14   [name], MD   losartan (COZAAR) 100 MG tablet Take 100 mg by mouth daily.    Historical Provider, MD  metoprolol tartrate (LOPRESSOR) 25 MG tablet Take 6.25 mg by mouth 2 (two) times daily.     Historical Provider, MD  omeprazole (PRILOSEC) 20 MG capsule Take 20 mg by mouth daily.    Historical Provider, MD  simvastatin (ZOCOR) 20 MG tablet Take 20 mg by mouth daily.    Historical Provider, MD   BP 171/51 mmHg  Pulse 51  Temp(Src) 98.1 F (36.7 C) (Oral)  Resp 22  SpO2 96% Physical Exam  Constitutional: He appears well-developed and well-nourished. No distress.  HENT:  Head: Normocephalic and atraumatic.  Eyes: Conjunctivae are normal. Right eye exhibits no discharge. Left eye exhibits no discharge.  Neck: Neck supple.  Cardiovascular: Normal rate, regular rhythm and normal heart sounds.  Exam reveals no gallop and no friction rub.   No murmur heard. Pulmonary/Chest: Effort normal and breath sounds normal. No respiratory distress.  Abdominal: Soft. He exhibits no distension. There is no tenderness.  Musculoskeletal: He exhibits no edema or tenderness.  Mild swelling of L foot/ankle and extending to calf. Faintly erythematous/plethoric appearance of foot worse around big toe and extending to mid/distal third of shin. Mild L calf tenderness. No significant increase in pain with ROM of foot, ankle or knee. Brisk cap refill in toes. Palpable DP pulse. I could not palpate a DP pulse. Perhaps mild  increased warmth L foot as compared to R. Sensation intact to light touch. Well healed b/l anterior knee surgical scars.   Neurological: He is alert.  Skin: Skin is warm and dry.  Psychiatric: He has a normal mood and affect. His behavior is normal. Thought content normal.  Nursing note and vitals reviewed.   ED Course  Procedures (including critical care time) Labs Review Labs Reviewed  CBC WITH DIFFERENTIAL/PLATELET - Abnormal; Notable for the following:    RBC 3.83 (*)    Hemoglobin 12.0 (*)    HCT  36.5 (*)    Platelets 134 (*)    All other components within normal limits  BASIC METABOLIC PANEL - Abnormal; Notable for the following:    Glucose, Bld 136 (*)    BUN 21 (*)    Creatinine, Ser 1.45 (*)    GFR calc non Af Amer 40 (*)    GFR calc Af Amer 46 (*)    All other components within normal limits    Imaging Review No results found. I have personally reviewed and evaluated these images and lab results as part of my medical decision-making.   EKG Interpretation None      MDM   Final diagnoses:  Pain  Left foot pain  Cellulitis of left lower extremity   89yM with L foot/lower extremity pain. Some concern for possible DVT although thrombocytopenia should make him less at risk. Is being treat with nplate though. Subsequent [place] negative. No trauma. No point bony tenderness. Doubt x-ray of much utility. Pain much improved with dose of percocet. Swelling/redness has progressed during course of ED stay though. Now higher concern for possible cellulitis. PCN allergy. Will place on bactrim. Will give script for percocet for a couple days as he responded well to this. Advised to avoid further tylenol. Minimal thrombocytopenia today. Continue to FU with heme/onc.    Virgel Manifold, MD 11/18/14 [phone]

## 2014-11-04 NOTE — Discharge Instructions (Signed)

## 2014-11-04 NOTE — ED Notes (Signed)
Per pt, having pain in left foot.  States center of foot to ankle.  No heat/redness/swelling noted.  Pt states pain does come up the leg also.  Pain started last night.  Pt walks with cane.  Did have increase in ambulation  Yesterday d/t going to [place] for appt.  No change in sensation to foot.

## 2014-11-08 ENCOUNTER — Ambulatory Visit (HOSPITAL_BASED_OUTPATIENT_CLINIC_OR_DEPARTMENT_OTHER): Payer: Medicare Other

## 2014-11-08 ENCOUNTER — Ambulatory Visit (HOSPITAL_BASED_OUTPATIENT_CLINIC_OR_DEPARTMENT_OTHER): Payer: Medicare Other | Admitting: Hematology

## 2014-11-08 ENCOUNTER — Encounter: Payer: Self-pay | Admitting: Hematology

## 2014-11-08 ENCOUNTER — Other Ambulatory Visit: Payer: PRIVATE HEALTH INSURANCE

## 2014-11-08 ENCOUNTER — Other Ambulatory Visit (HOSPITAL_BASED_OUTPATIENT_CLINIC_OR_DEPARTMENT_OTHER): Payer: Medicare Other

## 2014-11-08 VITALS — BP 152/43 | HR 46 | Temp 98.6°F | Resp 18 | Ht 67.5 in | Wt 207.7 lb

## 2014-11-08 DIAGNOSIS — D693 Immune thrombocytopenic purpura: Secondary | ICD-10-CM | POA: Diagnosis not present

## 2014-11-08 LAB — COMPREHENSIVE METABOLIC PANEL (CC13)
ALK PHOS: 79 U/L (ref 40–150)
ALT: 25 U/L (ref 0–55)
ANION GAP: 6 meq/L (ref 3–11)
AST: 27 U/L (ref 5–34)
Albumin: 3.4 g/dL — ABNORMAL LOW (ref 3.5–5.0)
BILIRUBIN TOTAL: 0.29 mg/dL (ref 0.20–1.20)
BUN: 27.5 mg/dL — ABNORMAL HIGH (ref 7.0–26.0)
CALCIUM: 9.2 mg/dL (ref 8.4–10.4)
CHLORIDE: 107 meq/L (ref 98–109)
CO2: 24 mEq/L (ref 22–29)
CREATININE: 2.1 mg/dL — AB (ref 0.7–1.3)
EGFR: 26 mL/min/{1.73_m2} — ABNORMAL LOW (ref >90)
Glucose: 106 mg/dl (ref 70–140)
Potassium: 4.9 mEq/L (ref 3.5–5.1)
Sodium: 138 mEq/L (ref 136–145)
Total Protein: 6.5 g/dL (ref 6.4–8.3)

## 2014-11-08 LAB — CBC WITH DIFFERENTIAL/PLATELET
BASO%: 0.4 % (ref 0.0–2.0)
BASOS ABS: 0 10*3/uL (ref 0.0–0.1)
EOS%: 1.7 % (ref 0.0–7.0)
Eosinophils Absolute: 0.1 10*3/uL (ref 0.0–0.5)
HEMATOCRIT: 35.3 % — AB (ref 38.4–49.9)
HGB: 11.4 g/dL — ABNORMAL LOW (ref 13.0–17.1)
LYMPH#: 1.4 10*3/uL (ref 0.9–3.3)
LYMPH%: 18.9 % (ref 14.0–49.0)
MCH: 30.7 pg (ref 27.2–33.4)
MCHC: 32.3 g/dL (ref 32.0–36.0)
MCV: 94.9 fL (ref 79.3–98.0)
MONO#: 0.8 10*3/uL (ref 0.1–0.9)
MONO%: 10.6 % (ref 0.0–14.0)
NEUT#: 4.9 10*3/uL (ref 1.5–6.5)
NEUT%: 68.4 % (ref 39.0–75.0)
PLATELETS: 153 10*3/uL (ref 140–400)
RBC: 3.72 10*6/uL — ABNORMAL LOW (ref 4.20–5.82)
RDW: 13.7 % (ref 11.0–14.6)
WBC: 7.2 10*3/uL (ref 4.0–10.3)

## 2014-11-08 MED ORDER — ROMIPLOSTIM 250 MCG ~~LOC~~ SOLR
285.0000 ug | SUBCUTANEOUS | Status: DC
  Administered 2014-11-08: 285 ug via SUBCUTANEOUS
  Filled 2014-11-08: qty 0.57

## 2014-11-09 ENCOUNTER — Telehealth: Payer: Self-pay | Admitting: Hematology

## 2014-11-09 NOTE — Telephone Encounter (Signed)
per pof to sch pt appt-pt to get updated sch on 9/16

## 2014-11-10 ENCOUNTER — Ambulatory Visit: Payer: PRIVATE HEALTH INSURANCE | Admitting: Internal Medicine

## 2014-11-15 ENCOUNTER — Other Ambulatory Visit (HOSPITAL_BASED_OUTPATIENT_CLINIC_OR_DEPARTMENT_OTHER): Payer: Medicare Other

## 2014-11-15 ENCOUNTER — Ambulatory Visit (HOSPITAL_BASED_OUTPATIENT_CLINIC_OR_DEPARTMENT_OTHER): Payer: Medicare Other

## 2014-11-15 VITALS — BP 156/55 | HR 52 | Temp 98.0°F

## 2014-11-15 DIAGNOSIS — D693 Immune thrombocytopenic purpura: Secondary | ICD-10-CM

## 2014-11-15 LAB — COMPREHENSIVE METABOLIC PANEL (CC13)
ALT: 15 U/L (ref 0–55)
ANION GAP: 6 meq/L (ref 3–11)
AST: 18 U/L (ref 5–34)
Albumin: 3.6 g/dL (ref 3.5–5.0)
Alkaline Phosphatase: 91 U/L (ref 40–150)
BUN: 23.7 mg/dL (ref 7.0–26.0)
CHLORIDE: 108 meq/L (ref 98–109)
CO2: 25 meq/L (ref 22–29)
CREATININE: 1.5 mg/dL — AB (ref 0.7–1.3)
Calcium: 9.2 mg/dL (ref 8.4–10.4)
EGFR: 38 mL/min/{1.73_m2} — ABNORMAL LOW (ref >90)
GLUCOSE: 125 mg/dL (ref 70–140)
Potassium: 5 mEq/L (ref 3.5–5.1)
SODIUM: 138 meq/L (ref 136–145)
TOTAL PROTEIN: 6.6 g/dL (ref 6.4–8.3)

## 2014-11-15 LAB — CBC WITH DIFFERENTIAL/PLATELET
BASO%: 0.2 % (ref 0.0–2.0)
Basophils Absolute: 0 10*3/uL (ref 0.0–0.1)
EOS%: 2 % (ref 0.0–7.0)
Eosinophils Absolute: 0.1 10*3/uL (ref 0.0–0.5)
HCT: 35.4 % — ABNORMAL LOW (ref 38.4–49.9)
HGB: 11.6 g/dL — ABNORMAL LOW (ref 13.0–17.1)
LYMPH%: 27 % (ref 14.0–49.0)
MCH: 31 pg (ref 27.2–33.4)
MCHC: 32.8 g/dL (ref 32.0–36.0)
MCV: 94.7 fL (ref 79.3–98.0)
MONO#: 0.7 10*3/uL (ref 0.1–0.9)
MONO%: 13.2 % (ref 0.0–14.0)
NEUT%: 57.6 % (ref 39.0–75.0)
NEUTROS ABS: 3.2 10*3/uL (ref 1.5–6.5)
Platelets: 352 10*3/uL (ref 140–400)
RBC: 3.74 10*6/uL — AB (ref 4.20–5.82)
RDW: 13.2 % (ref 11.0–14.6)
WBC: 5.6 10*3/uL (ref 4.0–10.3)
lymph#: 1.5 10*3/uL (ref 0.9–3.3)

## 2014-11-15 MED ORDER — ROMIPLOSTIM INJECTION 500 MCG
5.0000 ug/kg | SUBCUTANEOUS | Status: DC
  Administered 2014-11-15: 470 ug via SUBCUTANEOUS
  Filled 2014-11-15: qty 0.94

## 2014-11-15 NOTE — Progress Notes (Signed)
.    Hematology oncology clinic note  Date of service: 10/11/2014  Patient Care Team: No Pcp Per Patient as PCP - General (General Practice)  CHIEF COMPLAINTS/PURPOSE OF CONSULTATION: Follow-up for ITP   Diagnosis: Idiopathic thrombocytopenic purpura   Current treatment: Nplate weekly. Needing about 5 mcg/kg to maintain reasonable platelet counts  Previous treatment: Steroids, IVIG.  HISTORY OF PRESENTING ILLNESS:  please see my previous clinic note from 09/28/2014 for details of initial presentation and course of treatment.  Interval history  Patient is here for follow-up regarding his ITP. His platelet counts though fluctuating have been stable. He has had no issues with overt bleeding. Note that he had a left total infection and is on antibiotics for that which he got from the ED  MEDICAL HISTORY:  Past Medical History  Diagnosis Date  . BPH (benign prostatic hypertrophy)   . Chronic renal insufficiency   . Coronary artery disease     CABG in 2000  . Diabetes type 2, controlled     Diet-controlled  . Diabetic neuropathy   . Hypertension   . Dyslipidemia   . ITP (idiopathic thrombocytopenic purpura)   . Osteoarthritis     SURGICAL HISTORY: Past Surgical History  Procedure Laterality Date  . Cholecystectomy    . Tonsillectomy    . Right hip replacement  2014  . Coronary artery bypass graft  2000    SOCIAL HISTORY: Social History   Social History  . Marital Status: Unknown    Spouse Name: N/A  . Number of Children: N/A  . Years of Education: N/A   Occupational History  . Not on file.   Social History Main Topics  . Smoking status: Former Smoker -- 2.00 packs/day for 25 years  . Smokeless tobacco: Not on file  . Alcohol Use: No  . Drug Use: Not on file  . Sexual Activity: Not Currently   Other Topics Concern  . Not on file   Social History Narrative   He is living in [place] [facility] which is an assisted living facility. One of his son is also  moving to [facility] and has a Designer, jewellery in religious studies. Patient notes that his other son is a Systems analyst with multiple awards.  FAMILY HISTORY: History reviewed. No pertinent family history.  ALLERGIES:  is allergic to penicillins.  MEDICATIONS:  Current Outpatient Prescriptions  Medication Sig Dispense Refill  . acetaminophen (TYLENOL) 500 MG tablet Take 500 mg by mouth every 4 (four) hours as needed for moderate pain.     . Cholecalciferol (VITAMIN D-1000 MAX ST) 1000 UNITS tablet Take 1 tablet (1,000 Units total) by mouth daily.    Marland Kitchen losartan (COZAAR) 100 MG tablet Take 100 mg by mouth daily.    . metoprolol tartrate (LOPRESSOR) 25 MG tablet Take 6.25 mg by mouth 2 (two) times daily.     Marland Kitchen omeprazole (PRILOSEC) 20 MG capsule Take 20 mg by mouth daily.    Marland Kitchen oxyCODONE-acetaminophen (PERCOCET/ROXICET) 5-325 MG per tablet Take 1-2 tablets by mouth every 4 (four) hours as needed for severe pain. 12 tablet 0  . simvastatin (ZOCOR) 20 MG tablet Take 20 mg by mouth daily.     No current facility-administered medications for this visit.   Romiplostim 5 mcg/kg weekly   REVIEW OF SYSTEMS:   10 point review of system is negative except as noted above  PHYSICAL EXAMINATION: ECOG PERFORMANCE STATUS: 1 - Symptomatic but completely ambulatory  Filed Vitals:   11/08/14 0941  BP: 152/43  Pulse: 46  Temp: 98.6 F (37 C)  Resp: 18   Filed Weights   11/08/14 0941  Weight: 207 lb 11.2 oz (94.212 kg)   GENERAL: Elderly gentleman in no acute distress:alert and comfortable SKIN: skin color, texture, turgor are normal, no rashes or significant lesions EYES: normal, conjunctiva are pink and non-injected, sclera clear OROPHARYNX:no exudate, no erythema and lips, buccal mucosa, and tongue normal  NECK: supple, thyroid normal size, non-tender, without nodularity LYMPH:  no palpable lymphadenopathy in the cervical, axillary or inguinal LUNGS: clear to auscultation  HEART: regular  rate & rhythm and 2 x 6 systolic murmur over aortic area, and no lower extremity edema ABDOMEN:abdomen soft, non-tender and normal bowel sounds Musculoskeletal:no cyanosis of digits and no clubbing  PSYCH: alert & oriented x 3 with fluent speech NEURO: no focal motor/sensory deficits  LABORATORY DATA:  I have reviewed the data as listed Lab Results  Component Value Date   WBC 7.2 11/08/2014   HGB 11.4* 11/08/2014   HCT 35.3* 11/08/2014   MCV 94.9 11/08/2014   PLT 153 11/08/2014   . CBC Latest Ref Rng 11/08/2014 11/04/2014 11/01/2014  WBC 4.0 - 10.3 10e3/uL 7.2 7.0 5.7  Hemoglobin 13.0 - 17.1 g/dL 11.4(L) 12.0(L) 11.7(L)  Hematocrit 38.4 - 49.9 % 35.3(L) 36.5(L) 35.9(L)  Platelets 140 - 400 10e3/uL 153 134(L) 137(L)    . CMP Latest Ref Rng 11/08/2014 11/04/2014 10/11/2014  Glucose 70 - 140 mg/dl 106 136(H) 124  BUN 7.0 - 26.0 mg/dL 27.5(H) 21(H) 21.6  Creatinine 0.7 - 1.3 mg/dL 2.1(H) 1.45(H) 1.5(H)  Sodium 136 - 145 mEq/L 138 138 141  Potassium 3.5 - 5.1 mEq/L 4.9 4.7 4.5  Chloride 101 - 111 mmol/L - 105 -  CO2 22 - 29 mEq/L 24 26 24   Calcium 8.4 - 10.4 mg/dL 9.2 9.2 8.7  Total Protein 6.4 - 8.3 g/dL 6.5 - 6.2(L)  Total Bilirubin 0.20 - 1.20 mg/dL 0.29 - 0.39  Alkaline Phos 40 - 150 U/L 79 - 79  AST 5 - 34 U/L 27 - 18  ALT 0 - 55 U/L 25 - 15   . Lab Results  Component Value Date   TOTALPROTELP 6.5 10/18/2014   ALBUMINELP 3.6* 10/18/2014   A1GS 0.3 10/18/2014   A2GS 0.8 10/18/2014   BETS 0.5 10/18/2014   BETA2SER 0.4 10/18/2014   GAMS 1.0 10/18/2014   SPEI * 10/18/2014   (this displays SPEP labs)  No results found for: KPAFRELGTCHN, LAMBDASER, KAPLAMBRATIO (kappa/lambda light chains)    [place] abd 10/12/2014:  FINDINGS: Gallbladder: Cholecystectomy .  Common bile duct: Diameter: 6.5 mm  Liver: No focal lesion identified. Within normal limits in parenchymal echogenicity.  IVC: No abnormality visualized.  Pancreas: Visualized portion unremarkable.  Spleen:  Size and appearance within normal limits.  Right Kidney: Length: 10.1 cm. Cortical thinning. No hydronephrosis visualized. 8 mm simple cyst.  Left Kidney: Length: 12.1 cm. Cortical thinning. No hydronephrosis visualized. 1.2 cm simple cysts.  Abdominal aorta: No aneurysm visualized.  Other findings: None.  IMPRESSION: 1. Cholecystectomy. No biliary distention.  2. Bilateral renal cortical thinning. Simple tiny bilateral renal Cysts.    ASSESSMENT & PLAN:   81 year old Caucasian male with multiple medical comorbidities with  #1 chronic ITP since 1989. Has previously been responsive to steroids and has received IVIG on one occasion. He has been maintained on 3-47mcg/kg weekly of Nplate since mid 89FGE initially with [facility]. Arvin [facility]ard at [facility] and then with Dr. Jimmie Molly at [facility] in [place]. He has recently moved to [facility] and would like to continue his treatment hereplatelet counts a fluctuating from 38,000  To 147,000 today. No issues with bleeding . Ultrasound abdomen showed normal spleen size SPEP- no obvious monoclonal protein. IFE showed possibility of an restrictive bands in the IgG and Lambda lanes Plan  -Continue Romiplostim at 3-13mcg/kg with appropriate parameters for dose adjustment based on platelet counts going ahead. -Would treat Helicobacter pylori if IgG positive.  #2 other medical comorbidities -Patient currently has a primary care physician at the [facility] in [place] -He wants to transfer cares to [facility] primary care physician and has been given a referral for this.  Total time spent 15 minutes more than 50% time on direct patient contact counseling and coordination of care.  Return to care with Dr. Irene Limbo in 4 weeks Continue follow-up for weekly Romiplostim and labs.   Sullivan Lone MD [place] Hematology/Oncology Physician [facility]  (Office):       [phone] (Work cell):  [phone] (Fax):            [phone]

## 2014-11-22 ENCOUNTER — Ambulatory Visit (HOSPITAL_BASED_OUTPATIENT_CLINIC_OR_DEPARTMENT_OTHER): Payer: Medicare Other

## 2014-11-22 ENCOUNTER — Other Ambulatory Visit (HOSPITAL_BASED_OUTPATIENT_CLINIC_OR_DEPARTMENT_OTHER): Payer: Medicare Other

## 2014-11-22 VITALS — BP 188/45 | HR 53 | Temp 98.3°F

## 2014-11-22 DIAGNOSIS — D693 Immune thrombocytopenic purpura: Secondary | ICD-10-CM

## 2014-11-22 LAB — CBC WITH DIFFERENTIAL/PLATELET
BASO%: 0.6 % (ref 0.0–2.0)
Basophils Absolute: 0 10*3/uL (ref 0.0–0.1)
EOS%: 1.7 % (ref 0.0–7.0)
Eosinophils Absolute: 0.1 10*3/uL (ref 0.0–0.5)
HEMATOCRIT: 37.9 % — AB (ref 38.4–49.9)
HEMOGLOBIN: 12.3 g/dL — AB (ref 13.0–17.1)
LYMPH#: 1.5 10*3/uL (ref 0.9–3.3)
LYMPH%: 22.8 % (ref 14.0–49.0)
MCH: 30.8 pg (ref 27.2–33.4)
MCHC: 32.6 g/dL (ref 32.0–36.0)
MCV: 94.7 fL (ref 79.3–98.0)
MONO#: 0.8 10*3/uL (ref 0.1–0.9)
MONO%: 12.6 % (ref 0.0–14.0)
NEUT%: 62.3 % (ref 39.0–75.0)
NEUTROS ABS: 4.1 10*3/uL (ref 1.5–6.5)
Platelets: 350 10*3/uL (ref 140–400)
RBC: 4 10*6/uL — ABNORMAL LOW (ref 4.20–5.82)
RDW: 13.5 % (ref 11.0–14.6)
WBC: 6.5 10*3/uL (ref 4.0–10.3)

## 2014-11-22 LAB — COMPREHENSIVE METABOLIC PANEL (CC13)
ALBUMIN: 3.7 g/dL (ref 3.5–5.0)
ALK PHOS: 82 U/L (ref 40–150)
ALT: 14 U/L (ref 0–55)
AST: 19 U/L (ref 5–34)
Anion Gap: 9 mEq/L (ref 3–11)
BUN: 24.2 mg/dL (ref 7.0–26.0)
CALCIUM: 9.7 mg/dL (ref 8.4–10.4)
CO2: 25 mEq/L (ref 22–29)
CREATININE: 1.4 mg/dL — AB (ref 0.7–1.3)
Chloride: 107 mEq/L (ref 98–109)
EGFR: 43 mL/min/{1.73_m2} — ABNORMAL LOW (ref >90)
GLUCOSE: 141 mg/dL — AB (ref 70–140)
POTASSIUM: 4.8 meq/L (ref 3.5–5.1)
SODIUM: 140 meq/L (ref 136–145)
TOTAL PROTEIN: 6.9 g/dL (ref 6.4–8.3)
Total Bilirubin: 0.43 mg/dL (ref 0.20–1.20)

## 2014-11-22 MED ORDER — ROMIPLOSTIM 250 MCG ~~LOC~~ SOLR
285.0000 ug | SUBCUTANEOUS | Status: DC
  Administered 2014-11-22: 285 ug via SUBCUTANEOUS
  Filled 2014-11-22: qty 0.57

## 2014-11-29 ENCOUNTER — Telehealth: Payer: Self-pay | Admitting: Pharmacist

## 2014-11-29 ENCOUNTER — Ambulatory Visit (HOSPITAL_BASED_OUTPATIENT_CLINIC_OR_DEPARTMENT_OTHER): Payer: Medicare Other

## 2014-11-29 ENCOUNTER — Other Ambulatory Visit (HOSPITAL_BASED_OUTPATIENT_CLINIC_OR_DEPARTMENT_OTHER): Payer: Medicare Other

## 2014-11-29 VITALS — BP 155/50 | HR 55 | Temp 98.4°F

## 2014-11-29 DIAGNOSIS — D693 Immune thrombocytopenic purpura: Secondary | ICD-10-CM

## 2014-11-29 LAB — CBC WITH DIFFERENTIAL/PLATELET
BASO%: 0.4 % (ref 0.0–2.0)
BASOS ABS: 0 10*3/uL (ref 0.0–0.1)
EOS ABS: 0.1 10*3/uL (ref 0.0–0.5)
EOS%: 1 % (ref 0.0–7.0)
HCT: 34.9 % — ABNORMAL LOW (ref 38.4–49.9)
HGB: 11.5 g/dL — ABNORMAL LOW (ref 13.0–17.1)
LYMPH%: 18 % (ref 14.0–49.0)
MCH: 30.8 pg (ref 27.2–33.4)
MCHC: 33 g/dL (ref 32.0–36.0)
MCV: 93.4 fL (ref 79.3–98.0)
MONO#: 0.7 10*3/uL (ref 0.1–0.9)
MONO%: 10 % (ref 0.0–14.0)
NEUT%: 70.6 % (ref 39.0–75.0)
NEUTROS ABS: 4.8 10*3/uL (ref 1.5–6.5)
PLATELETS: 390 10*3/uL (ref 140–400)
RBC: 3.74 10*6/uL — AB (ref 4.20–5.82)
RDW: 13.2 % (ref 11.0–14.6)
WBC: 6.8 10*3/uL (ref 4.0–10.3)
lymph#: 1.2 10*3/uL (ref 0.9–3.3)

## 2014-11-29 LAB — COMPREHENSIVE METABOLIC PANEL (CC13)
ALBUMIN: 3.4 g/dL — AB (ref 3.5–5.0)
ALK PHOS: 70 U/L (ref 40–150)
ALT: 16 U/L (ref 0–55)
ANION GAP: 7 meq/L (ref 3–11)
AST: 19 U/L (ref 5–34)
BILIRUBIN TOTAL: 0.39 mg/dL (ref 0.20–1.20)
BUN: 17.5 mg/dL (ref 7.0–26.0)
CO2: 23 meq/L (ref 22–29)
Calcium: 9 mg/dL (ref 8.4–10.4)
Chloride: 111 mEq/L — ABNORMAL HIGH (ref 98–109)
Creatinine: 1.3 mg/dL (ref 0.7–1.3)
EGFR: 45 mL/min/{1.73_m2} — AB (ref >90)
Glucose: 156 mg/dl — ABNORMAL HIGH (ref 70–140)
POTASSIUM: 4.3 meq/L (ref 3.5–5.1)
Sodium: 141 mEq/L (ref 136–145)
TOTAL PROTEIN: 6.3 g/dL — AB (ref 6.4–8.3)

## 2014-11-29 MED ORDER — ROMIPLOSTIM 250 MCG ~~LOC~~ SOLR
95.0000 ug | SUBCUTANEOUS | Status: DC
Start: 2014-11-29 — End: 2014-11-29
  Administered 2014-11-29: 95 ug via SUBCUTANEOUS
  Filled 2014-11-29: qty 0.19

## 2014-11-29 NOTE — Patient Instructions (Signed)

## 2014-11-29 NOTE — Telephone Encounter (Signed)
Dr. Irene Limbo would like to give NPlate 1 mcg/kg today. Pltc = 390 Patient has historically fallen quickly when NPlate held.

## 2014-12-06 ENCOUNTER — Other Ambulatory Visit (HOSPITAL_BASED_OUTPATIENT_CLINIC_OR_DEPARTMENT_OTHER): Payer: Medicare Other

## 2014-12-06 ENCOUNTER — Ambulatory Visit (HOSPITAL_BASED_OUTPATIENT_CLINIC_OR_DEPARTMENT_OTHER): Payer: Medicare Other

## 2014-12-06 VITALS — BP 163/53 | HR 58 | Temp 98.0°F | Resp 18

## 2014-12-06 DIAGNOSIS — D693 Immune thrombocytopenic purpura: Secondary | ICD-10-CM

## 2014-12-06 LAB — COMPREHENSIVE METABOLIC PANEL (CC13)
ALT: 16 U/L (ref 0–55)
AST: 19 U/L (ref 5–34)
Albumin: 3.3 g/dL — ABNORMAL LOW (ref 3.5–5.0)
Alkaline Phosphatase: 79 U/L (ref 40–150)
Anion Gap: 7 mEq/L (ref 3–11)
BILIRUBIN TOTAL: 0.36 mg/dL (ref 0.20–1.20)
BUN: 19.8 mg/dL (ref 7.0–26.0)
CO2: 24 meq/L (ref 22–29)
CREATININE: 1.4 mg/dL — AB (ref 0.7–1.3)
Calcium: 9 mg/dL (ref 8.4–10.4)
Chloride: 110 mEq/L — ABNORMAL HIGH (ref 98–109)
EGFR: 43 mL/min/{1.73_m2} — ABNORMAL LOW (ref >90)
GLUCOSE: 129 mg/dL (ref 70–140)
Potassium: 4.7 mEq/L (ref 3.5–5.1)
SODIUM: 141 meq/L (ref 136–145)
TOTAL PROTEIN: 6.3 g/dL — AB (ref 6.4–8.3)

## 2014-12-06 LAB — CBC WITH DIFFERENTIAL/PLATELET
BASO%: 0.3 % (ref 0.0–2.0)
Basophils Absolute: 0 10*3/uL (ref 0.0–0.1)
EOS ABS: 0.1 10*3/uL (ref 0.0–0.5)
EOS%: 1.1 % (ref 0.0–7.0)
HCT: 35 % — ABNORMAL LOW (ref 38.4–49.9)
HGB: 11.4 g/dL — ABNORMAL LOW (ref 13.0–17.1)
LYMPH#: 1.2 10*3/uL (ref 0.9–3.3)
LYMPH%: 18.3 % (ref 14.0–49.0)
MCH: 30.5 pg (ref 27.2–33.4)
MCHC: 32.5 g/dL (ref 32.0–36.0)
MCV: 94 fL (ref 79.3–98.0)
MONO#: 0.8 10*3/uL (ref 0.1–0.9)
MONO%: 11.7 % (ref 0.0–14.0)
NEUT%: 68.6 % (ref 39.0–75.0)
NEUTROS ABS: 4.7 10*3/uL (ref 1.5–6.5)
Platelets: 330 10*3/uL (ref 140–400)
RBC: 3.73 10*6/uL — AB (ref 4.20–5.82)
RDW: 13.4 % (ref 11.0–14.6)
WBC: 6.8 10*3/uL (ref 4.0–10.3)

## 2014-12-06 MED ORDER — ROMIPLOSTIM 250 MCG ~~LOC~~ SOLR
95.0000 ug | SUBCUTANEOUS | Status: DC
  Administered 2014-12-06: 95 ug via SUBCUTANEOUS
  Filled 2014-12-06: qty 0.19

## 2014-12-06 NOTE — Patient Instructions (Signed)
Romiplostim injection What is this medicine? ROMIPLOSTIM (roe mi PLOE stim) helps your body make more platelets. This medicine is used to treat low platelets caused by chronic idiopathic thrombocytopenic purpura (ITP). This medicine may be used for other purposes; ask your health care provider or pharmacist if you have questions. What should I tell my health care provider before I take this medicine? They need to know if you have any of these conditions: -cancer or myelodysplastic syndrome -low blood counts, like low white cell, platelet, or red cell counts -take medicines that treat or prevent blood clots -an unusual or allergic reaction to romiplostim, mannitol, other medicines, foods, dyes, or preservatives -pregnant or trying to get pregnant -breast-feeding How should I use this medicine? This medicine is for injection under the skin. It is given by a health care professional in a hospital or clinic setting. A special MedGuide will be given to you before your injection. Read this information carefully each time. Talk to your pediatrician regarding the use of this medicine in children. Special care may be needed. Overdosage: If you think you have taken too much of this medicine contact a poison control center or emergency room at once. NOTE: This medicine is only for you. Do not share this medicine with others. What if I miss a dose? It is important not to miss your dose. Call your doctor or health care professional if you are unable to keep an appointment. What may interact with this medicine? Interactions are not expected. This list may not describe all possible interactions. Give your health care provider a list of all the medicines, herbs, non-prescription drugs, or dietary supplements you use. Also tell them if you smoke, drink alcohol, or use illegal drugs. Some items may interact with your medicine. What should I watch for while using this medicine? Your condition will be monitored  carefully while you are receiving this medicine. Visit your prescriber or health care professional for regular checks on your progress and for the needed blood tests. It is important to keep all appointments. What side effects may I notice from receiving this medicine? Side effects that you should report to your doctor or health care professional as soon as possible: -allergic reactions like skin rash, itching or hives, swelling of the face, lips, or tongue -shortness of breath, chest pain, swelling in a leg -unusual bleeding or bruising Side effects that usually do not require medical attention (report to your doctor or health care professional if they continue or are bothersome): -dizziness -headache -muscle aches -pain in arms and legs -stomach pain -trouble sleeping This list may not describe all possible side effects. Call your doctor for medical advice about side effects. You may report side effects to FDA at 1-800-FDA-1088. Where should I keep my medicine? This drug is given in a hospital or clinic and will not be stored at home. NOTE: This sheet is a summary. It may not cover all possible information. If you have questions about this medicine, talk to your doctor, pharmacist, or health care provider.    2016, Elsevier/Gold Standard. (2007-10-12 15:13:04)  

## 2014-12-13 ENCOUNTER — Ambulatory Visit (HOSPITAL_BASED_OUTPATIENT_CLINIC_OR_DEPARTMENT_OTHER): Payer: Medicare Other

## 2014-12-13 ENCOUNTER — Other Ambulatory Visit (HOSPITAL_BASED_OUTPATIENT_CLINIC_OR_DEPARTMENT_OTHER): Payer: Medicare Other

## 2014-12-13 VITALS — BP 168/54 | HR 50 | Temp 98.2°F

## 2014-12-13 DIAGNOSIS — D693 Immune thrombocytopenic purpura: Secondary | ICD-10-CM | POA: Diagnosis present

## 2014-12-13 LAB — CBC WITH DIFFERENTIAL/PLATELET
BASO%: 0.2 % (ref 0.0–2.0)
BASOS ABS: 0 10*3/uL (ref 0.0–0.1)
EOS ABS: 0.1 10*3/uL (ref 0.0–0.5)
EOS%: 1.3 % (ref 0.0–7.0)
HEMATOCRIT: 35.3 % — AB (ref 38.4–49.9)
HEMOGLOBIN: 11.6 g/dL — AB (ref 13.0–17.1)
LYMPH#: 1.3 10*3/uL (ref 0.9–3.3)
LYMPH%: 21 % (ref 14.0–49.0)
MCH: 30.8 pg (ref 27.2–33.4)
MCHC: 32.9 g/dL (ref 32.0–36.0)
MCV: 93.6 fL (ref 79.3–98.0)
MONO#: 0.6 10*3/uL (ref 0.1–0.9)
MONO%: 9.9 % (ref 0.0–14.0)
NEUT%: 67.6 % (ref 39.0–75.0)
NEUTROS ABS: 4.2 10*3/uL (ref 1.5–6.5)
Platelets: 150 10*3/uL (ref 140–400)
RBC: 3.77 10*6/uL — ABNORMAL LOW (ref 4.20–5.82)
RDW: 13.3 % (ref 11.0–14.6)
WBC: 6.2 10*3/uL (ref 4.0–10.3)

## 2014-12-13 MED ORDER — ROMIPLOSTIM 250 MCG ~~LOC~~ SOLR
95.0000 ug | SUBCUTANEOUS | Status: DC
  Administered 2014-12-13: 95 ug via SUBCUTANEOUS
  Filled 2014-12-13: qty 0.19

## 2014-12-20 ENCOUNTER — Other Ambulatory Visit (HOSPITAL_BASED_OUTPATIENT_CLINIC_OR_DEPARTMENT_OTHER): Payer: Medicare Other

## 2014-12-20 ENCOUNTER — Ambulatory Visit (HOSPITAL_BASED_OUTPATIENT_CLINIC_OR_DEPARTMENT_OTHER): Payer: Medicare Other

## 2014-12-20 VITALS — BP 180/51 | HR 48 | Temp 97.9°F

## 2014-12-20 DIAGNOSIS — D693 Immune thrombocytopenic purpura: Secondary | ICD-10-CM

## 2014-12-20 LAB — CBC WITH DIFFERENTIAL/PLATELET
BASO%: 0.4 % (ref 0.0–2.0)
Basophils Absolute: 0 10*3/uL (ref 0.0–0.1)
EOS%: 1.2 % (ref 0.0–7.0)
Eosinophils Absolute: 0.1 10*3/uL (ref 0.0–0.5)
HEMATOCRIT: 36.3 % — AB (ref 38.4–49.9)
HEMOGLOBIN: 12.1 g/dL — AB (ref 13.0–17.1)
LYMPH#: 1.7 10*3/uL (ref 0.9–3.3)
LYMPH%: 24.5 % (ref 14.0–49.0)
MCH: 31.4 pg (ref 27.2–33.4)
MCHC: 33.3 g/dL (ref 32.0–36.0)
MCV: 94.3 fL (ref 79.3–98.0)
MONO#: 0.7 10*3/uL (ref 0.1–0.9)
MONO%: 10.9 % (ref 0.0–14.0)
NEUT%: 63 % (ref 39.0–75.0)
NEUTROS ABS: 4.3 10*3/uL (ref 1.5–6.5)
Platelets: 108 10*3/uL — ABNORMAL LOW (ref 140–400)
RBC: 3.85 10*6/uL — ABNORMAL LOW (ref 4.20–5.82)
RDW: 13.5 % (ref 11.0–14.6)
WBC: 6.8 10*3/uL (ref 4.0–10.3)
nRBC: 0 % (ref 0–0)

## 2014-12-20 MED ORDER — ROMIPLOSTIM 250 MCG ~~LOC~~ SOLR
95.0000 ug | SUBCUTANEOUS | Status: DC
  Administered 2014-12-20: 95 ug via SUBCUTANEOUS
  Filled 2014-12-20: qty 0.19

## 2014-12-27 ENCOUNTER — Other Ambulatory Visit (HOSPITAL_BASED_OUTPATIENT_CLINIC_OR_DEPARTMENT_OTHER): Payer: Medicare Other

## 2014-12-27 ENCOUNTER — Ambulatory Visit (HOSPITAL_BASED_OUTPATIENT_CLINIC_OR_DEPARTMENT_OTHER): Payer: Medicare Other

## 2014-12-27 VITALS — BP 173/50 | HR 49 | Temp 98.3°F

## 2014-12-27 DIAGNOSIS — D693 Immune thrombocytopenic purpura: Secondary | ICD-10-CM

## 2014-12-27 LAB — CBC WITH DIFFERENTIAL/PLATELET
BASO%: 0.2 % (ref 0.0–2.0)
BASOS ABS: 0 10*3/uL (ref 0.0–0.1)
EOS ABS: 0.1 10*3/uL (ref 0.0–0.5)
EOS%: 1.4 % (ref 0.0–7.0)
HEMATOCRIT: 35.8 % — AB (ref 38.4–49.9)
HEMOGLOBIN: 11.7 g/dL — AB (ref 13.0–17.1)
LYMPH#: 1.3 10*3/uL (ref 0.9–3.3)
LYMPH%: 21.8 % (ref 14.0–49.0)
MCH: 31 pg (ref 27.2–33.4)
MCHC: 32.7 g/dL (ref 32.0–36.0)
MCV: 94.7 fL (ref 79.3–98.0)
MONO#: 0.6 10*3/uL (ref 0.1–0.9)
MONO%: 10.2 % (ref 0.0–14.0)
NEUT#: 3.9 10*3/uL (ref 1.5–6.5)
NEUT%: 66.4 % (ref 39.0–75.0)
NRBC: 0 % (ref 0–0)
Platelets: 68 10*3/uL — ABNORMAL LOW (ref 140–400)
RBC: 3.78 10*6/uL — ABNORMAL LOW (ref 4.20–5.82)
RDW: 13.6 % (ref 11.0–14.6)
WBC: 5.8 10*3/uL (ref 4.0–10.3)

## 2014-12-27 MED ORDER — ROMIPLOSTIM 250 MCG ~~LOC~~ SOLR
95.0000 ug | SUBCUTANEOUS | Status: DC
  Administered 2014-12-27: 95 ug via SUBCUTANEOUS
  Filled 2014-12-27: qty 0.19

## 2015-01-03 ENCOUNTER — Ambulatory Visit (HOSPITAL_BASED_OUTPATIENT_CLINIC_OR_DEPARTMENT_OTHER): Payer: Medicare Other

## 2015-01-03 ENCOUNTER — Telehealth: Payer: Self-pay | Admitting: Hematology

## 2015-01-03 ENCOUNTER — Other Ambulatory Visit (HOSPITAL_BASED_OUTPATIENT_CLINIC_OR_DEPARTMENT_OTHER): Payer: Medicare Other

## 2015-01-03 VITALS — BP 164/78 | HR 50 | Temp 97.5°F

## 2015-01-03 DIAGNOSIS — D693 Immune thrombocytopenic purpura: Secondary | ICD-10-CM

## 2015-01-03 LAB — CBC WITH DIFFERENTIAL/PLATELET
BASO%: 0.2 % (ref 0.0–2.0)
BASOS ABS: 0 10*3/uL (ref 0.0–0.1)
EOS%: 1.7 % (ref 0.0–7.0)
Eosinophils Absolute: 0.1 10*3/uL (ref 0.0–0.5)
HEMATOCRIT: 37.3 % — AB (ref 38.4–49.9)
HEMOGLOBIN: 12.2 g/dL — AB (ref 13.0–17.1)
LYMPH#: 1.4 10*3/uL (ref 0.9–3.3)
LYMPH%: 26.9 % (ref 14.0–49.0)
MCH: 31 pg (ref 27.2–33.4)
MCHC: 32.7 g/dL (ref 32.0–36.0)
MCV: 94.7 fL (ref 79.3–98.0)
MONO#: 0.6 10*3/uL (ref 0.1–0.9)
MONO%: 11.8 % (ref 0.0–14.0)
NEUT#: 3.2 10*3/uL (ref 1.5–6.5)
NEUT%: 59.4 % (ref 39.0–75.0)
Platelets: 89 10*3/uL — ABNORMAL LOW (ref 140–400)
RBC: 3.94 10*6/uL — ABNORMAL LOW (ref 4.20–5.82)
RDW: 13.7 % (ref 11.0–14.6)
WBC: 5.3 10*3/uL (ref 4.0–10.3)
nRBC: 0 % (ref 0–0)

## 2015-01-03 MED ORDER — ROMIPLOSTIM 250 MCG ~~LOC~~ SOLR
95.0000 ug | SUBCUTANEOUS | Status: DC
  Administered 2015-01-03: 95 ug via SUBCUTANEOUS
  Filled 2015-01-03: qty 0.19

## 2015-01-03 NOTE — Telephone Encounter (Signed)
Pt came in to r/s appt....done...printed pt new sched

## 2015-01-09 ENCOUNTER — Other Ambulatory Visit: Payer: PRIVATE HEALTH INSURANCE

## 2015-01-09 ENCOUNTER — Ambulatory Visit: Payer: PRIVATE HEALTH INSURANCE | Admitting: Hematology

## 2015-01-10 ENCOUNTER — Ambulatory Visit (HOSPITAL_BASED_OUTPATIENT_CLINIC_OR_DEPARTMENT_OTHER): Payer: Medicare Other | Admitting: Hematology

## 2015-01-10 ENCOUNTER — Other Ambulatory Visit (HOSPITAL_BASED_OUTPATIENT_CLINIC_OR_DEPARTMENT_OTHER): Payer: Medicare Other

## 2015-01-10 ENCOUNTER — Ambulatory Visit (HOSPITAL_BASED_OUTPATIENT_CLINIC_OR_DEPARTMENT_OTHER): Payer: Medicare Other

## 2015-01-10 ENCOUNTER — Other Ambulatory Visit: Payer: PRIVATE HEALTH INSURANCE

## 2015-01-10 ENCOUNTER — Telehealth: Payer: Self-pay | Admitting: Hematology

## 2015-01-10 ENCOUNTER — Encounter: Payer: Self-pay | Admitting: Hematology

## 2015-01-10 VITALS — BP 171/51 | HR 64 | Temp 97.4°F | Resp 18 | Ht 67.5 in | Wt 214.0 lb

## 2015-01-10 DIAGNOSIS — N189 Chronic kidney disease, unspecified: Secondary | ICD-10-CM

## 2015-01-10 DIAGNOSIS — D693 Immune thrombocytopenic purpura: Secondary | ICD-10-CM | POA: Diagnosis not present

## 2015-01-10 LAB — CBC WITH DIFFERENTIAL/PLATELET
BASO%: 0.4 % (ref 0.0–2.0)
Basophils Absolute: 0 10*3/uL (ref 0.0–0.1)
EOS%: 1 % (ref 0.0–7.0)
Eosinophils Absolute: 0.1 10*3/uL (ref 0.0–0.5)
HEMATOCRIT: 35.4 % — AB (ref 38.4–49.9)
HEMOGLOBIN: 11.9 g/dL — AB (ref 13.0–17.1)
LYMPH#: 1.4 10*3/uL (ref 0.9–3.3)
LYMPH%: 28.5 % (ref 14.0–49.0)
MCH: 31.6 pg (ref 27.2–33.4)
MCHC: 33.6 g/dL (ref 32.0–36.0)
MCV: 93.9 fL (ref 79.3–98.0)
MONO#: 0.6 10*3/uL (ref 0.1–0.9)
MONO%: 12.7 % (ref 0.0–14.0)
NEUT%: 57.4 % (ref 39.0–75.0)
NEUTROS ABS: 2.9 10*3/uL (ref 1.5–6.5)
Platelets: 92 10*3/uL — ABNORMAL LOW (ref 140–400)
RBC: 3.77 10*6/uL — ABNORMAL LOW (ref 4.20–5.82)
RDW: 13.7 % (ref 11.0–14.6)
WBC: 5 10*3/uL (ref 4.0–10.3)
nRBC: 0 % (ref 0–0)

## 2015-01-10 MED ORDER — ROMIPLOSTIM 250 MCG ~~LOC~~ SOLR
1.0000 ug/kg | SUBCUTANEOUS | Status: DC
  Administered 2015-01-10: 95 ug via SUBCUTANEOUS
  Filled 2015-01-10: qty 0.19

## 2015-01-10 NOTE — Telephone Encounter (Signed)
per pof to sch pt appt-gave pt copy of avs °

## 2015-01-16 ENCOUNTER — Other Ambulatory Visit (HOSPITAL_BASED_OUTPATIENT_CLINIC_OR_DEPARTMENT_OTHER): Payer: Medicare Other

## 2015-01-16 ENCOUNTER — Ambulatory Visit (HOSPITAL_BASED_OUTPATIENT_CLINIC_OR_DEPARTMENT_OTHER): Payer: Medicare Other

## 2015-01-16 VITALS — BP 157/53 | HR 45 | Temp 97.7°F

## 2015-01-16 DIAGNOSIS — D693 Immune thrombocytopenic purpura: Secondary | ICD-10-CM | POA: Diagnosis present

## 2015-01-16 LAB — CBC WITH DIFFERENTIAL/PLATELET
BASO%: 0.2 % (ref 0.0–2.0)
Basophils Absolute: 0 10*3/uL (ref 0.0–0.1)
EOS%: 0.7 % (ref 0.0–7.0)
Eosinophils Absolute: 0 10*3/uL (ref 0.0–0.5)
HCT: 35.8 % — ABNORMAL LOW (ref 38.4–49.9)
HEMOGLOBIN: 11.7 g/dL — AB (ref 13.0–17.1)
LYMPH#: 1.3 10*3/uL (ref 0.9–3.3)
LYMPH%: 21.9 % (ref 14.0–49.0)
MCH: 31 pg (ref 27.2–33.4)
MCHC: 32.7 g/dL (ref 32.0–36.0)
MCV: 94.7 fL (ref 79.3–98.0)
MONO#: 0.7 10*3/uL (ref 0.1–0.9)
MONO%: 11.6 % (ref 0.0–14.0)
NEUT#: 3.8 10*3/uL (ref 1.5–6.5)
NEUT%: 65.6 % (ref 39.0–75.0)
NRBC: 0 % (ref 0–0)
Platelets: 101 10*3/uL — ABNORMAL LOW (ref 140–400)
RBC: 3.78 10*6/uL — AB (ref 4.20–5.82)
RDW: 13.6 % (ref 11.0–14.6)
WBC: 5.8 10*3/uL (ref 4.0–10.3)

## 2015-01-16 MED ORDER — ROMIPLOSTIM INJECTION 500 MCG
5.0000 ug/kg | SUBCUTANEOUS | Status: DC
  Administered 2015-01-16: 485 ug via SUBCUTANEOUS
  Filled 2015-01-16: qty 0.97

## 2015-01-17 ENCOUNTER — Ambulatory Visit: Payer: Medicare Other

## 2015-01-17 ENCOUNTER — Other Ambulatory Visit: Payer: Medicare Other

## 2015-01-24 ENCOUNTER — Ambulatory Visit (HOSPITAL_BASED_OUTPATIENT_CLINIC_OR_DEPARTMENT_OTHER): Payer: Medicare Other

## 2015-01-24 ENCOUNTER — Other Ambulatory Visit (HOSPITAL_BASED_OUTPATIENT_CLINIC_OR_DEPARTMENT_OTHER): Payer: Medicare Other

## 2015-01-24 ENCOUNTER — Telehealth: Payer: Self-pay | Admitting: Pharmacist

## 2015-01-24 VITALS — BP 163/57 | HR 55 | Temp 97.9°F

## 2015-01-24 DIAGNOSIS — D693 Immune thrombocytopenic purpura: Secondary | ICD-10-CM

## 2015-01-24 LAB — CBC WITH DIFFERENTIAL/PLATELET
BASO%: 0.4 % (ref 0.0–2.0)
Basophils Absolute: 0 10*3/uL (ref 0.0–0.1)
EOS ABS: 0.1 10*3/uL (ref 0.0–0.5)
EOS%: 1.4 % (ref 0.0–7.0)
HCT: 35.1 % — ABNORMAL LOW (ref 38.4–49.9)
HGB: 11.5 g/dL — ABNORMAL LOW (ref 13.0–17.1)
LYMPH%: 26.5 % (ref 14.0–49.0)
MCH: 31 pg (ref 27.2–33.4)
MCHC: 32.8 g/dL (ref 32.0–36.0)
MCV: 94.6 fL (ref 79.3–98.0)
MONO#: 0.7 10*3/uL (ref 0.1–0.9)
MONO%: 12 % (ref 0.0–14.0)
NEUT%: 59.7 % (ref 39.0–75.0)
NEUTROS ABS: 3.3 10*3/uL (ref 1.5–6.5)
NRBC: 0 % (ref 0–0)
PLATELETS: 239 10*3/uL (ref 140–400)
RBC: 3.71 10*6/uL — AB (ref 4.20–5.82)
RDW: 14 % (ref 11.0–14.6)
WBC: 5.6 10*3/uL (ref 4.0–10.3)
lymph#: 1.5 10*3/uL (ref 0.9–3.3)

## 2015-01-24 MED ORDER — ROMIPLOSTIM INJECTION 500 MCG
5.0000 ug/kg | SUBCUTANEOUS | Status: DC
  Filled 2015-01-24: qty 0.97

## 2015-01-24 MED ORDER — ROMIPLOSTIM 250 MCG ~~LOC~~ SOLR
195.0000 ug | SUBCUTANEOUS | Status: DC
  Administered 2015-01-24: 195 ug via SUBCUTANEOUS
  Filled 2015-01-24: qty 0.39

## 2015-01-24 NOTE — Telephone Encounter (Signed)
Spoke with Dr. Irene Limbo regarding NPlate dosing. Doses have been variable over the last several months. Dr. Irene Limbo would like to give 2 mcg/kg today; pltc = 239. Patient's pltc has fallen rapidly when NPlate withheld. After this dose, follow the dosing monograph in the package insert.

## 2015-01-25 NOTE — Progress Notes (Signed)
.    Hematology oncology clinic note  Date of service: .01/10/2015   Patient Care Team: No Pcp Per Patient as PCP - General (General Practice)  CHIEF COMPLAINTS/PURPOSE OF CONSULTATION: Follow-up for ITP   Diagnosis: Idiopathic thrombocytopenic purpura   Current treatment: Nplate weekly to maintain reasonable platelet counts >50K. Requirement have ranged from 2mcg/kg to 36mcg/kg.  Previous treatment: Steroids, IVIG.  HISTORY OF PRESENTING ILLNESS:  please see my previous clinic note from 09/28/2014 for details of initial presentation and course of treatment.  Interval history  Patient is here for follow-up regarding his ITP. His platelet counts have been stable and he has been receiving 1 mcg/kg dose of 95 g weekly for the last month. Platelet count today is 92k and has been about 68k and its lowest. No issues with no bleeding or bruising. Overall feels well and has been eating well. Once he has been eating too well and has gained some weight. No leg swelling. No chest pain or shortness of breath.  MEDICAL HISTORY:  Past Medical History  Diagnosis Date  . BPH (benign prostatic hypertrophy)   . Chronic renal insufficiency   . Coronary artery disease     CABG in 2000  . Diabetes type 2, controlled ([place])     Diet-controlled  . Diabetic neuropathy ([place])   . Hypertension   . Dyslipidemia   . ITP (idiopathic thrombocytopenic purpura)   . Osteoarthritis     SURGICAL HISTORY: Past Surgical History  Procedure Laterality Date  . Cholecystectomy    . Tonsillectomy    . Right hip replacement  2014  . Coronary artery bypass graft  2000    SOCIAL HISTORY: Social History   Social History  . Marital Status: Unknown    Spouse Name: N/A  . Number of Children: N/A  . Years of Education: N/A   Occupational History  . Not on file.   Social History Main Topics  . Smoking status: Former Smoker -- 2.00 packs/day for 25 years  . Smokeless tobacco: Not on file  . Alcohol Use: No   . Drug Use: Not on file  . Sexual Activity: Not Currently   Other Topics Concern  . Not on file   Social History Narrative   He is living in [facility] which is an assisted living facility. One of his son is also moving to [facility] and has a Designer, jewellery in religious studies. Patient notes that his other son is a Systems analyst with multiple awards.  FAMILY HISTORY: History reviewed. No pertinent family history.  ALLERGIES:  is allergic to penicillins.  MEDICATIONS:  Current Outpatient Prescriptions  Medication Sig Dispense Refill  . acetaminophen (TYLENOL) 500 MG tablet Take 250 mg by mouth 2 (two) times daily.     . Cholecalciferol (VITAMIN D-1000 MAX ST) 1000 UNITS tablet Take 1 tablet (1,000 Units total) by mouth daily.    Marland Kitchen losartan (COZAAR) 100 MG tablet Take 100 mg by mouth daily.    . metoprolol tartrate (LOPRESSOR) 25 MG tablet Take 6.25 mg by mouth 2 (two) times daily.     Marland Kitchen omeprazole (PRILOSEC) 20 MG capsule Take 20 mg by mouth daily.    . simvastatin (ZOCOR) 20 MG tablet Take 20 mg by mouth daily.    Marland Kitchen oxyCODONE-acetaminophen (PERCOCET/ROXICET) 5-325 MG per tablet Take 1-2 tablets by mouth every 4 (four) hours as needed for severe pain. (Patient not taking: Reported on 01/10/2015) 12 tablet 0   No current facility-administered medications for this visit.  Romiplostim 5 mcg/kg weekly   REVIEW OF SYSTEMS:   10 point review of system is negative except as noted above  PHYSICAL EXAMINATION: ECOG PERFORMANCE STATUS: 1 - Symptomatic but completely ambulatory  Filed Vitals:   01/10/15 0933  BP: 171/51  Pulse: 64  Temp: 97.4 F (36.3 C)  Resp: 18   Filed Weights   01/10/15 0933  Weight: 214 lb (97.07 kg)   GENERAL: Elderly gentleman in no acute distress:alert and comfortable SKIN: skin color, texture, turgor are normal, no rashes or significant lesions EYES: normal, conjunctiva are pink and non-injected, sclera clear OROPHARYNX:no exudate, no  erythema and lips, buccal mucosa, and tongue normal  NECK: supple, thyroid normal size, non-tender, without nodularity LYMPH:  no palpable lymphadenopathy in the cervical, axillary or inguinal LUNGS: clear to auscultation  HEART: regular rate & rhythm and 2 x 6 systolic murmur over aortic area, and no lower extremity edema ABDOMEN:abdomen soft, non-tender and normal bowel sounds Musculoskeletal:no cyanosis of digits and no clubbing  PSYCH: alert & oriented x 3 with fluent speech NEURO: no focal motor/sensory deficits  LABORATORY DATA:   Component     Latest Ref Rng 12/06/2014 12/13/2014 12/20/2014 12/27/2014  WBC     4.0 - 10.3 10e3/uL 6.8 6.2 6.8 5.8  NEUT#     1.5 - 6.5 10e3/uL 4.7 4.2 4.3 3.9  Hemoglobin     13.0 - 17.1 g/dL 11.4 (L) 11.6 (L) 12.1 (L) 11.7 (L)  HCT     38.4 - 49.9 % 35.0 (L) 35.3 (L) 36.3 (L) 35.8 (L)  Platelets     140 - 400 10e3/uL 330 150 108 (L) 68 (L)  MCV     79.3 - 98.0 fL 94.0 93.6 94.3 94.7  MCH     27.2 - 33.4 pg 30.5 30.8 31.4 31.0  MCHC     32.0 - 36.0 g/dL 32.5 32.9 33.3 32.7  RBC     4.20 - 5.82 10e6/uL 3.73 (L) 3.77 (L) 3.85 (L) 3.78 (L)  RDW     11.0 - 14.6 % 13.4 13.3 13.5 13.6  lymph#     0.9 - 3.3 10e3/uL 1.2 1.3 1.7 1.3  MONO#     0.1 - 0.9 10e3/uL 0.8 0.6 0.7 0.6  Eosinophils Absolute     0.0 - 0.5 10e3/uL 0.1 0.1 0.1 0.1  Basophils Absolute     0.0 - 0.1 10e3/uL 0.0 0.0 0.0 0.0  NEUT%     39.0 - 75.0 % 68.6 67.6 63.0 66.4  LYMPH%     14.0 - 49.0 % 18.3 21.0 24.5 21.8  MONO%     0.0 - 14.0 % 11.7 9.9 10.9 10.2  EOS%     0.0 - 7.0 % 1.1 1.3 1.2 1.4  BASO%     0.0 - 2.0 % 0.3 0.2 0.4 0.2  nRBC     0 - 0 %   0 0   Component     Latest Ref Rng 01/03/2015 01/10/2015  WBC     4.0 - 10.3 10e3/uL 5.3 5.0  NEUT#     1.5 - 6.5 10e3/uL 3.2 2.9  Hemoglobin     13.0 - 17.1 g/dL 12.2 (L) 11.9 (L)  HCT     38.4 - 49.9 % 37.3 (L) 35.4 (L)  Platelets     140 - 400 10e3/uL 89 (L) 92 (L)  MCV     79.3 - 98.0 fL 94.7 93.9  MCH      27.2 - 33.4  pg 31.0 31.6  MCHC     32.0 - 36.0 g/dL 32.7 33.6  RBC     4.20 - 5.82 10e6/uL 3.94 (L) 3.77 (L)  RDW     11.0 - 14.6 % 13.7 13.7  lymph#     0.9 - 3.3 10e3/uL 1.4 1.4  MONO#     0.1 - 0.9 10e3/uL 0.6 0.6  Eosinophils Absolute     0.0 - 0.5 10e3/uL 0.1 0.1  Basophils Absolute     0.0 - 0.1 10e3/uL 0.0 0.0  NEUT%     39.0 - 75.0 % 59.4 57.4  LYMPH%     14.0 - 49.0 % 26.9 28.5  MONO%     0.0 - 14.0 % 11.8 12.7  EOS%     0.0 - 7.0 % 1.7 1.0  BASO%     0.0 - 2.0 % 0.2 0.4  nRBC     0 - 0 % 0 0    [place] abd 10/12/2014:  FINDINGS: Gallbladder: Cholecystectomy .  Common bile duct: Diameter: 6.5 mm  Liver: No focal lesion identified. Within normal limits in parenchymal echogenicity.  IVC: No abnormality visualized.  Pancreas: Visualized portion unremarkable.  Spleen: Size and appearance within normal limits.  Right Kidney: Length: 10.1 cm. Cortical thinning. No hydronephrosis visualized. 8 mm simple cyst.  Left Kidney: Length: 12.1 cm. Cortical thinning. No hydronephrosis visualized. 1.2 cm simple cysts.  Abdominal aorta: No aneurysm visualized.  Other findings: None.  IMPRESSION: 1. Cholecystectomy. No biliary distention.  2. Bilateral renal cortical thinning. Simple tiny bilateral renal Cysts.    ASSESSMENT & PLAN:   81 year old Caucasian male with multiple medical comorbidities with  #1 chronic ITP since 1989. Has previously been responsive to steroids and has received IVIG on one occasion. He has been maintained on 3-97mcg/kg weekly of Nplate since mid 89FGE initially with Dr. Arvin Collard at [facility] and then with Dr. Jimmie Molly at [facility] in [place]. He has recently moved to [facility] and would like to continue his treatment hereplatelet counts a fluctuating from 38,000  To 147,000 today. No issues with bleeding . Ultrasound abdomen showed normal spleen size SPEP- no obvious monoclonal protein. IFE showed  possibility of an restrictive bands in the IgG and Lambda lanes Plan  -Continue Romiplostim weekly with appropriate parameters for dose adjustment based on platelet counts going ahead. Over the last 4 weeks his platelets have been stable with 1 mcg/kg that is 95 g subcutaneous dose weekly . -We'll continue to treat with prominent last weekly and adjust dose according to the standard nomogram. -Return to care for follow-up in 4 weeks. -Continue weekly CBCs and every 4 weekly CMP.  #2 other medical comorbidities -He has had a primary care physician in the [facility] system .  Total time spent 15 minutes more than 50% time on direct patient contact counseling and coordination of care.  Return to care with Dr. Irene Limbo in 4 weeks Continue follow-up for weekly Romiplostim and labs.   Sullivan Lone MD [place] Hematology/Oncology Physician [facility]  (Office):       [phone] (Work cell):  [phone] (Fax):           [phone]

## 2015-01-31 ENCOUNTER — Ambulatory Visit (HOSPITAL_BASED_OUTPATIENT_CLINIC_OR_DEPARTMENT_OTHER): Payer: Medicare Other

## 2015-01-31 ENCOUNTER — Other Ambulatory Visit (HOSPITAL_BASED_OUTPATIENT_CLINIC_OR_DEPARTMENT_OTHER): Payer: Medicare Other

## 2015-01-31 VITALS — BP 167/65 | HR 54 | Temp 98.1°F

## 2015-01-31 DIAGNOSIS — D693 Immune thrombocytopenic purpura: Secondary | ICD-10-CM | POA: Diagnosis present

## 2015-01-31 DIAGNOSIS — N189 Chronic kidney disease, unspecified: Secondary | ICD-10-CM

## 2015-01-31 LAB — CBC WITH DIFFERENTIAL/PLATELET
BASO%: 0.2 % (ref 0.0–2.0)
BASOS ABS: 0 10*3/uL (ref 0.0–0.1)
EOS%: 1.5 % (ref 0.0–7.0)
Eosinophils Absolute: 0.1 10*3/uL (ref 0.0–0.5)
HCT: 35 % — ABNORMAL LOW (ref 38.4–49.9)
HEMOGLOBIN: 11.5 g/dL — AB (ref 13.0–17.1)
LYMPH%: 18.8 % (ref 14.0–49.0)
MCH: 31.1 pg (ref 27.2–33.4)
MCHC: 33 g/dL (ref 32.0–36.0)
MCV: 94.3 fL (ref 79.3–98.0)
MONO#: 0.7 10*3/uL (ref 0.1–0.9)
MONO%: 10.4 % (ref 0.0–14.0)
NEUT#: 4.5 10*3/uL (ref 1.5–6.5)
NEUT%: 69.1 % (ref 39.0–75.0)
Platelets: 391 10*3/uL (ref 140–400)
RBC: 3.71 10*6/uL — ABNORMAL LOW (ref 4.20–5.82)
RDW: 14.2 % (ref 11.0–14.6)
WBC: 6.5 10*3/uL (ref 4.0–10.3)
lymph#: 1.2 10*3/uL (ref 0.9–3.3)

## 2015-01-31 LAB — COMPREHENSIVE METABOLIC PANEL
ALBUMIN: 3.4 g/dL — AB (ref 3.5–5.0)
ALT: 12 U/L (ref 0–55)
AST: 22 U/L (ref 5–34)
Alkaline Phosphatase: 84 U/L (ref 40–150)
Anion Gap: 7 mEq/L (ref 3–11)
BUN: 19.4 mg/dL (ref 7.0–26.0)
CHLORIDE: 110 meq/L — AB (ref 98–109)
CO2: 24 mEq/L (ref 22–29)
Calcium: 9.5 mg/dL (ref 8.4–10.4)
Creatinine: 1.6 mg/dL — ABNORMAL HIGH (ref 0.7–1.3)
EGFR: 37 mL/min/{1.73_m2} — ABNORMAL LOW (ref >90)
GLUCOSE: 160 mg/dL — AB (ref 70–140)
POTASSIUM: 4.9 meq/L (ref 3.5–5.1)
SODIUM: 141 meq/L (ref 136–145)
Total Bilirubin: 0.31 mg/dL (ref 0.20–1.20)
Total Protein: 6.6 g/dL (ref 6.4–8.3)

## 2015-01-31 MED ORDER — ROMIPLOSTIM 250 MCG ~~LOC~~ SOLR
100.0000 ug | SUBCUTANEOUS | Status: DC
  Administered 2015-01-31: 100 ug via SUBCUTANEOUS
  Filled 2015-01-31: qty 0.2

## 2015-02-07 ENCOUNTER — Other Ambulatory Visit (HOSPITAL_BASED_OUTPATIENT_CLINIC_OR_DEPARTMENT_OTHER): Payer: Medicare Other

## 2015-02-07 ENCOUNTER — Ambulatory Visit (HOSPITAL_BASED_OUTPATIENT_CLINIC_OR_DEPARTMENT_OTHER): Payer: Medicare Other

## 2015-02-07 VITALS — BP 169/51 | HR 52 | Temp 98.2°F

## 2015-02-07 DIAGNOSIS — D693 Immune thrombocytopenic purpura: Secondary | ICD-10-CM

## 2015-02-07 LAB — CBC WITH DIFFERENTIAL/PLATELET
BASO%: 0.3 % (ref 0.0–2.0)
BASOS ABS: 0 10*3/uL (ref 0.0–0.1)
EOS%: 0.8 % (ref 0.0–7.0)
Eosinophils Absolute: 0.1 10*3/uL (ref 0.0–0.5)
HCT: 34.6 % — ABNORMAL LOW (ref 38.4–49.9)
HEMOGLOBIN: 11.3 g/dL — AB (ref 13.0–17.1)
LYMPH#: 1.2 10*3/uL (ref 0.9–3.3)
LYMPH%: 16.3 % (ref 14.0–49.0)
MCH: 31 pg (ref 27.2–33.4)
MCHC: 32.7 g/dL (ref 32.0–36.0)
MCV: 94.8 fL (ref 79.3–98.0)
MONO#: 1 10*3/uL — ABNORMAL HIGH (ref 0.1–0.9)
MONO%: 13.2 % (ref 0.0–14.0)
NEUT%: 69.4 % (ref 39.0–75.0)
NEUTROS ABS: 5.3 10*3/uL (ref 1.5–6.5)
NRBC: 0 % (ref 0–0)
Platelets: 304 10*3/uL (ref 140–400)
RBC: 3.65 10*6/uL — AB (ref 4.20–5.82)
RDW: 13.9 % (ref 11.0–14.6)
WBC: 7.6 10*3/uL (ref 4.0–10.3)

## 2015-02-07 MED ORDER — ROMIPLOSTIM 250 MCG ~~LOC~~ SOLR
100.0000 ug | SUBCUTANEOUS | Status: DC
Start: 2015-02-07 — End: 2015-02-07
  Administered 2015-02-07: 100 ug via SUBCUTANEOUS
  Filled 2015-02-07: qty 0.2

## 2015-02-14 ENCOUNTER — Ambulatory Visit (HOSPITAL_BASED_OUTPATIENT_CLINIC_OR_DEPARTMENT_OTHER): Payer: Medicare Other

## 2015-02-14 ENCOUNTER — Other Ambulatory Visit (HOSPITAL_BASED_OUTPATIENT_CLINIC_OR_DEPARTMENT_OTHER): Payer: Medicare Other

## 2015-02-14 VITALS — BP 165/63 | HR 53 | Temp 97.7°F

## 2015-02-14 DIAGNOSIS — D693 Immune thrombocytopenic purpura: Secondary | ICD-10-CM

## 2015-02-14 LAB — CBC WITH DIFFERENTIAL/PLATELET
BASO%: 0.3 % (ref 0.0–2.0)
BASOS ABS: 0 10*3/uL (ref 0.0–0.1)
EOS ABS: 0.1 10*3/uL (ref 0.0–0.5)
EOS%: 0.9 % (ref 0.0–7.0)
HEMATOCRIT: 35 % — AB (ref 38.4–49.9)
HGB: 11.6 g/dL — ABNORMAL LOW (ref 13.0–17.1)
LYMPH%: 24.2 % (ref 14.0–49.0)
MCH: 31.4 pg (ref 27.2–33.4)
MCHC: 33.1 g/dL (ref 32.0–36.0)
MCV: 94.6 fL (ref 79.3–98.0)
MONO#: 0.7 10*3/uL (ref 0.1–0.9)
MONO%: 12 % (ref 0.0–14.0)
NEUT#: 3.6 10*3/uL (ref 1.5–6.5)
NEUT%: 62.6 % (ref 39.0–75.0)
PLATELETS: 152 10*3/uL (ref 140–400)
RBC: 3.7 10*6/uL — AB (ref 4.20–5.82)
RDW: 13.6 % (ref 11.0–14.6)
WBC: 5.7 10*3/uL (ref 4.0–10.3)
lymph#: 1.4 10*3/uL (ref 0.9–3.3)
nRBC: 0 % (ref 0–0)

## 2015-02-14 MED ORDER — ROMIPLOSTIM 250 MCG ~~LOC~~ SOLR
100.0000 ug | SUBCUTANEOUS | Status: DC
  Administered 2015-02-14: 100 ug via SUBCUTANEOUS
  Filled 2015-02-14: qty 0.2

## 2015-02-21 ENCOUNTER — Telehealth: Payer: Self-pay | Admitting: *Deleted

## 2015-02-21 ENCOUNTER — Ambulatory Visit: Payer: Medicare Other

## 2015-02-21 ENCOUNTER — Other Ambulatory Visit: Payer: Medicare Other

## 2015-02-21 NOTE — Telephone Encounter (Signed)
Lawrence Warner called on states he in unable to get here today,       Wants to reschedule for tomorrow.  New time and date given.

## 2015-02-22 ENCOUNTER — Other Ambulatory Visit (HOSPITAL_BASED_OUTPATIENT_CLINIC_OR_DEPARTMENT_OTHER): Payer: Medicare Other

## 2015-02-22 ENCOUNTER — Ambulatory Visit (HOSPITAL_BASED_OUTPATIENT_CLINIC_OR_DEPARTMENT_OTHER): Payer: Medicare Other

## 2015-02-22 VITALS — BP 156/41 | HR 48 | Temp 97.8°F

## 2015-02-22 DIAGNOSIS — D693 Immune thrombocytopenic purpura: Secondary | ICD-10-CM | POA: Diagnosis present

## 2015-02-22 LAB — CBC WITH DIFFERENTIAL/PLATELET
BASO%: 0.2 % (ref 0.0–2.0)
BASOS ABS: 0 10*3/uL (ref 0.0–0.1)
EOS ABS: 0.1 10*3/uL (ref 0.0–0.5)
EOS%: 1.6 % (ref 0.0–7.0)
HEMATOCRIT: 36.2 % — AB (ref 38.4–49.9)
HEMOGLOBIN: 11.8 g/dL — AB (ref 13.0–17.1)
LYMPH#: 1.5 10*3/uL (ref 0.9–3.3)
LYMPH%: 23.4 % (ref 14.0–49.0)
MCH: 31.2 pg (ref 27.2–33.4)
MCHC: 32.6 g/dL (ref 32.0–36.0)
MCV: 95.8 fL (ref 79.3–98.0)
MONO#: 0.8 10*3/uL (ref 0.1–0.9)
MONO%: 12.7 % (ref 0.0–14.0)
NEUT%: 62.1 % (ref 39.0–75.0)
NEUTROS ABS: 3.9 10*3/uL (ref 1.5–6.5)
NRBC: 0 % (ref 0–0)
PLATELETS: 153 10*3/uL (ref 140–400)
RBC: 3.78 10*6/uL — AB (ref 4.20–5.82)
RDW: 14 % (ref 11.0–14.6)
WBC: 6.3 10*3/uL (ref 4.0–10.3)

## 2015-02-22 MED ORDER — ROMIPLOSTIM 250 MCG ~~LOC~~ SOLR
100.0000 ug | SUBCUTANEOUS | Status: DC
  Administered 2015-02-22: 100 ug via SUBCUTANEOUS
  Filled 2015-02-22: qty 0.2

## 2015-02-28 ENCOUNTER — Ambulatory Visit (HOSPITAL_BASED_OUTPATIENT_CLINIC_OR_DEPARTMENT_OTHER): Payer: Medicare Other

## 2015-02-28 ENCOUNTER — Other Ambulatory Visit (HOSPITAL_BASED_OUTPATIENT_CLINIC_OR_DEPARTMENT_OTHER): Payer: Medicare Other

## 2015-02-28 VITALS — BP 182/61 | HR 51 | Temp 97.5°F

## 2015-02-28 DIAGNOSIS — N189 Chronic kidney disease, unspecified: Secondary | ICD-10-CM

## 2015-02-28 DIAGNOSIS — D693 Immune thrombocytopenic purpura: Secondary | ICD-10-CM

## 2015-02-28 LAB — CBC WITH DIFFERENTIAL/PLATELET
BASO%: 0.3 % (ref 0.0–2.0)
BASOS ABS: 0 10*3/uL (ref 0.0–0.1)
EOS ABS: 0.1 10*3/uL (ref 0.0–0.5)
EOS%: 1.8 % (ref 0.0–7.0)
HEMATOCRIT: 36 % — AB (ref 38.4–49.9)
HGB: 11.8 g/dL — ABNORMAL LOW (ref 13.0–17.1)
LYMPH#: 1.3 10*3/uL (ref 0.9–3.3)
LYMPH%: 17.4 % (ref 14.0–49.0)
MCH: 31.4 pg (ref 27.2–33.4)
MCHC: 32.8 g/dL (ref 32.0–36.0)
MCV: 95.6 fL (ref 79.3–98.0)
MONO#: 0.8 10*3/uL (ref 0.1–0.9)
MONO%: 10.7 % (ref 0.0–14.0)
NEUT#: 5.4 10*3/uL (ref 1.5–6.5)
NEUT%: 69.8 % (ref 39.0–75.0)
PLATELETS: 166 10*3/uL (ref 140–400)
RBC: 3.76 10*6/uL — ABNORMAL LOW (ref 4.20–5.82)
RDW: 14 % (ref 11.0–14.6)
WBC: 7.7 10*3/uL (ref 4.0–10.3)

## 2015-02-28 LAB — COMPREHENSIVE METABOLIC PANEL
ALT: 19 U/L (ref 0–55)
ANION GAP: 8 meq/L (ref 3–11)
AST: 22 U/L (ref 5–34)
Albumin: 3.5 g/dL (ref 3.5–5.0)
Alkaline Phosphatase: 72 U/L (ref 40–150)
BILIRUBIN TOTAL: 0.43 mg/dL (ref 0.20–1.20)
BUN: 18.8 mg/dL (ref 7.0–26.0)
CALCIUM: 9.5 mg/dL (ref 8.4–10.4)
CHLORIDE: 107 meq/L (ref 98–109)
CO2: 24 mEq/L (ref 22–29)
CREATININE: 1.5 mg/dL — AB (ref 0.7–1.3)
EGFR: 41 mL/min/{1.73_m2} — ABNORMAL LOW (ref >90)
Glucose: 150 mg/dl — ABNORMAL HIGH (ref 70–140)
Potassium: 5.1 mEq/L (ref 3.5–5.1)
Sodium: 139 mEq/L (ref 136–145)
Total Protein: 6.6 g/dL (ref 6.4–8.3)

## 2015-02-28 MED ORDER — ROMIPLOSTIM 250 MCG ~~LOC~~ SOLR
100.0000 ug | SUBCUTANEOUS | Status: DC
  Administered 2015-02-28: 100 ug via SUBCUTANEOUS
  Filled 2015-02-28: qty 0.2

## 2015-03-07 ENCOUNTER — Other Ambulatory Visit (HOSPITAL_BASED_OUTPATIENT_CLINIC_OR_DEPARTMENT_OTHER): Payer: Medicare Other

## 2015-03-07 ENCOUNTER — Ambulatory Visit (HOSPITAL_BASED_OUTPATIENT_CLINIC_OR_DEPARTMENT_OTHER): Payer: Medicare Other

## 2015-03-07 VITALS — BP 161/51 | HR 52 | Temp 98.4°F

## 2015-03-07 DIAGNOSIS — D693 Immune thrombocytopenic purpura: Secondary | ICD-10-CM

## 2015-03-07 LAB — CBC WITH DIFFERENTIAL/PLATELET
BASO%: 0.1 % (ref 0.0–2.0)
Basophils Absolute: 0 10*3/uL (ref 0.0–0.1)
EOS%: 2.3 % (ref 0.0–7.0)
Eosinophils Absolute: 0.2 10*3/uL (ref 0.0–0.5)
HEMATOCRIT: 34.7 % — AB (ref 38.4–49.9)
HEMOGLOBIN: 11.4 g/dL — AB (ref 13.0–17.1)
LYMPH#: 1.4 10*3/uL (ref 0.9–3.3)
LYMPH%: 19.3 % (ref 14.0–49.0)
MCH: 31.3 pg (ref 27.2–33.4)
MCHC: 32.9 g/dL (ref 32.0–36.0)
MCV: 95.3 fL (ref 79.3–98.0)
MONO#: 0.8 10*3/uL (ref 0.1–0.9)
MONO%: 11.1 % (ref 0.0–14.0)
NEUT%: 67.2 % (ref 39.0–75.0)
NEUTROS ABS: 5 10*3/uL (ref 1.5–6.5)
Platelets: 217 10*3/uL (ref 140–400)
RBC: 3.64 10*6/uL — ABNORMAL LOW (ref 4.20–5.82)
RDW: 13.7 % (ref 11.0–14.6)
WBC: 7.4 10*3/uL (ref 4.0–10.3)
nRBC: 0 % (ref 0–0)

## 2015-03-07 MED ORDER — ROMIPLOSTIM 250 MCG ~~LOC~~ SOLR
100.0000 ug | SUBCUTANEOUS | Status: DC
  Administered 2015-03-07: 100 ug via SUBCUTANEOUS
  Filled 2015-03-07: qty 0.2

## 2015-03-14 ENCOUNTER — Other Ambulatory Visit (HOSPITAL_BASED_OUTPATIENT_CLINIC_OR_DEPARTMENT_OTHER): Payer: Medicare Other

## 2015-03-14 ENCOUNTER — Ambulatory Visit (HOSPITAL_BASED_OUTPATIENT_CLINIC_OR_DEPARTMENT_OTHER): Payer: Medicare Other

## 2015-03-14 ENCOUNTER — Encounter: Payer: Self-pay | Admitting: Hematology

## 2015-03-14 ENCOUNTER — Telehealth: Payer: Self-pay | Admitting: Hematology

## 2015-03-14 ENCOUNTER — Ambulatory Visit (HOSPITAL_BASED_OUTPATIENT_CLINIC_OR_DEPARTMENT_OTHER): Payer: Medicare Other | Admitting: Hematology

## 2015-03-14 VITALS — BP 158/55 | HR 63 | Temp 97.7°F | Resp 17 | Ht 67.5 in | Wt 215.1 lb

## 2015-03-14 DIAGNOSIS — J209 Acute bronchitis, unspecified: Secondary | ICD-10-CM | POA: Diagnosis not present

## 2015-03-14 DIAGNOSIS — D693 Immune thrombocytopenic purpura: Secondary | ICD-10-CM | POA: Diagnosis not present

## 2015-03-14 DIAGNOSIS — J40 Bronchitis, not specified as acute or chronic: Secondary | ICD-10-CM | POA: Insufficient documentation

## 2015-03-14 LAB — CBC WITH DIFFERENTIAL/PLATELET
BASO%: 0.1 % (ref 0.0–2.0)
BASOS ABS: 0 10*3/uL (ref 0.0–0.1)
EOS%: 1.8 % (ref 0.0–7.0)
Eosinophils Absolute: 0.2 10*3/uL (ref 0.0–0.5)
HCT: 34.3 % — ABNORMAL LOW (ref 38.4–49.9)
HEMOGLOBIN: 11.5 g/dL — AB (ref 13.0–17.1)
LYMPH#: 1.4 10*3/uL (ref 0.9–3.3)
LYMPH%: 16.2 % (ref 14.0–49.0)
MCH: 31.9 pg (ref 27.2–33.4)
MCHC: 33.5 g/dL (ref 32.0–36.0)
MCV: 95 fL (ref 79.3–98.0)
MONO#: 1.1 10*3/uL — ABNORMAL HIGH (ref 0.1–0.9)
MONO%: 12.3 % (ref 0.0–14.0)
NEUT#: 6.1 10*3/uL (ref 1.5–6.5)
NEUT%: 69.6 % (ref 39.0–75.0)
Platelets: 182 10*3/uL (ref 140–400)
RBC: 3.61 10*6/uL — ABNORMAL LOW (ref 4.20–5.82)
RDW: 13.1 % (ref 11.0–14.6)
WBC: 8.8 10*3/uL (ref 4.0–10.3)
nRBC: 0 % (ref 0–0)

## 2015-03-14 MED ORDER — AZITHROMYCIN 250 MG PO TABS: ORAL_TABLET | ORAL | Status: DC

## 2015-03-14 MED ORDER — CYANOCOBALAMIN 1000 MCG/ML IJ SOLN
1000.0000 ug | INTRAMUSCULAR | Status: DC
  Administered 2015-03-14: 1000 ug via SUBCUTANEOUS

## 2015-03-14 MED ORDER — PREDNISONE 20 MG PO TABS: 20.0000 mg | ORAL_TABLET | Freq: Every day | ORAL | Status: DC

## 2015-03-14 MED ORDER — SALINE SPRAY 0.65 % NA SOLN: 1.0000 | NASAL | Status: DC | PRN

## 2015-03-14 MED ORDER — MAGNESIUM OXIDE 400 (241.3 MG) MG PO TABS: 400.0000 mg | ORAL_TABLET | Freq: Every day | ORAL | Status: DC

## 2015-03-14 MED ORDER — ROMIPLOSTIM 250 MCG ~~LOC~~ SOLR
100.0000 ug | SUBCUTANEOUS | Status: DC
  Administered 2015-03-14: 100 ug via SUBCUTANEOUS
  Filled 2015-03-14: qty 0.2

## 2015-03-14 NOTE — Telephone Encounter (Signed)
per pof to sch pt appt-gave pt aopy of avs

## 2015-03-20 NOTE — Progress Notes (Signed)
.    Hematology oncology clinic note  Date of service: .03/14/2015   Patient Care Team: No Pcp Per Patient as PCP - General (General Practice)  CHIEF COMPLAINTS/PURPOSE OF CONSULTATION: Follow-up for ITP   Diagnosis: Idiopathic thrombocytopenic purpura   Current treatment: Nplate weekly to maintain reasonable platelet counts >50K. Requirement have ranged from 49mcg/kg to 70mcg/kg.  Previous treatment: Steroids, IVIG.  HISTORY OF PRESENTING ILLNESS:  please see my previous clinic note from 09/28/2014 for details of initial presentation and course of treatment.  Interval history  Patient is here for follow-up regarding his ITP. His platelet counts have been stable and he has required 1 mcg/kg of Nplate for the last several doses. Overall feels well. Notes that he has developed a productive cough with some greenish phlegm and mild wheezing. He was given a prescription for azithromycin and prednisone for an acute bronchitis. He was also noted to establish a primary care physician. He notes that he has planned to establish care with a primary care physician that visits his assisted living facility. No other acute new symptoms.     MEDICAL HISTORY:  Past Medical History  Diagnosis Date  . BPH (benign prostatic hypertrophy)   . Chronic renal insufficiency   . Coronary artery disease     CABG in 2000  . Diabetes type 2, controlled ([place])     Diet-controlled  . Diabetic neuropathy ([place])   . Hypertension   . Dyslipidemia   . ITP (idiopathic thrombocytopenic purpura)   . Osteoarthritis     SURGICAL HISTORY: Past Surgical History  Procedure Laterality Date  . Cholecystectomy    . Tonsillectomy    . Right hip replacement  2014  . Coronary artery bypass graft  2000    SOCIAL HISTORY: Social History   Social History  . Marital Status: Unknown    Spouse Name: N/A  . Number of Children: N/A  . Years of Education: N/A   Occupational History  . Not on file.   Social History  Main Topics  . Smoking status: Former Smoker -- 2.00 packs/day for 25 years  . Smokeless tobacco: Not on file  . Alcohol Use: No  . Drug Use: Not on file  . Sexual Activity: Not Currently   Other Topics Concern  . Not on file   Social History Narrative   He is living in [place] which is an assisted living facility. One of his son is also moving to [facility] and has a Designer, jewellery in religious studies. Patient notes that his other son is a Systems analyst with multiple awards.  FAMILY HISTORY: History reviewed. No pertinent family history.  ALLERGIES:  is allergic to penicillins.  MEDICATIONS:  Current Outpatient Prescriptions  Medication Sig Dispense Refill  . acetaminophen (TYLENOL) 500 MG tablet Take 250 mg by mouth 2 (two) times daily.     . Cholecalciferol (VITAMIN D-1000 MAX ST) 1000 UNITS tablet Take 1 tablet (1,000 Units total) by mouth daily.    Marland Kitchen losartan (COZAAR) 100 MG tablet Take 100 mg by mouth daily.    . metoprolol tartrate (LOPRESSOR) 25 MG tablet Take 6.25 mg by mouth 2 (two) times daily.     Marland Kitchen omeprazole (PRILOSEC) 20 MG capsule Take 20 mg by mouth daily.    Marland Kitchen oxyCODONE-acetaminophen (PERCOCET/ROXICET) 5-325 MG per tablet Take 1-2 tablets by mouth every 4 (four) hours as needed for severe pain. 12 tablet 0  . simvastatin (ZOCOR) 20 MG tablet Take 20 mg by mouth daily.    Marland Kitchen  azithromycin (ZITHROMAX) 250 MG tablet 500mg  po day 1 then 250mg  po daily for 4 days 6 each 0  . magnesium oxide (MAG-OX) 400 (241.3 Mg) MG tablet Take 1 tablet (400 mg total) by mouth daily. 30 tablet 0  . predniSONE (DELTASONE) 20 MG tablet Take 1 tablet (20 mg total) by mouth daily with breakfast. 5 tablet 0  . sodium chloride (OCEAN) 0.65 % SOLN nasal spray Place 1 spray into both nostrils as needed for congestion. 30 mL 2   No current facility-administered medications for this visit.   Romiplostim 5 mcg/kg weekly   REVIEW OF SYSTEMS:   10 point review of system is negative  except as noted above  PHYSICAL EXAMINATION: ECOG PERFORMANCE STATUS: 1 - Symptomatic but completely ambulatory  Filed Vitals:   03/14/15 0957  BP: 158/55  Pulse: 63  Temp: 97.7 F (36.5 C)  Resp: 17   Filed Weights   03/14/15 0957  Weight: 215 lb 1.6 oz (97.569 kg)   GENERAL: Elderly gentleman in no acute distress:alert and comfortable SKIN: skin color, texture, turgor are normal, no rashes or significant lesions EYES: normal, conjunctiva are pink and non-injected, sclera clear OROPHARYNX:no exudate, no erythema and lips, buccal mucosa, and tongue normal  NECK: supple, thyroid normal size, non-tender, without nodularity LYMPH:  no palpable lymphadenopathy in the cervical, axillary or inguinal LUNGS: Few scattered wheezes HEART: regular rate & rhythm and 2 x 6 systolic murmur over aortic area, and no lower extremity edema ABDOMEN:abdomen soft, non-tender and normal bowel sounds Musculoskeletal:no cyanosis of digits and no clubbing  PSYCH: alert & oriented x 3 with fluent speech NEURO: no focal motor/sensory deficits  LABORATORY DATA:  . CBC Latest Ref Rng 03/14/2015 03/07/2015 02/28/2015  WBC 4.0 - 10.3 10e3/uL 8.8 7.4 7.7  Hemoglobin 13.0 - 17.1 g/dL 11.5(L) 11.4(L) 11.8(L)  Hematocrit 38.4 - 49.9 % 34.3(L) 34.7(L) 36.0(L)  Platelets 140 - 400 10e3/uL 182 217 166    . CMP Latest Ref Rng 02/28/2015 01/31/2015 12/06/2014  Glucose 70 - 140 mg/dl 150(H) 160(H) 129  BUN 7.0 - 26.0 mg/dL 18.8 19.4 19.8  Creatinine 0.7 - 1.3 mg/dL 1.5(H) 1.6(H) 1.4(H)  Sodium 136 - 145 mEq/L 139 141 141  Potassium 3.5 - 5.1 mEq/L 5.1 4.9 4.7  Chloride 101 - 111 mmol/L - - -  CO2 22 - 29 mEq/L 24 24 24   Calcium 8.4 - 10.4 mg/dL 9.5 9.5 9.0  Total Protein 6.4 - 8.3 g/dL 6.6 6.6 6.3(L)  Total Bilirubin 0.20 - 1.20 mg/dL 0.43 0.31 0.36  Alkaline Phos 40 - 150 U/L 72 84 79  AST 5 - 34 U/L 22 22 19   ALT 0 - 55 U/L 19 12 16      ASSESSMENT & PLAN:   82 year old Caucasian male with multiple  medical comorbidities with  #1 chronic ITP since 1989. Has previously been responsive to steroids and has received IVIG on one occasion. He has been maintained on 3-18mcg/kg weekly of Nplate since mid 89FGE initially with Dr. Arvin Collard at [facility] and then with Dr. Jimmie Molly at [facility] in [place].  No issues with bleeding . Ultrasound abdomen showed normal spleen size SPEP- no obvious monoclonal protein. IFE showed possibility of an restrictive bands in the IgG and Lambda lanes. Patient's platelet counts have remained fairly stable at Romiplostim doses of 23mcg/kg. Plan  -Continue Romiplostim weekly with appropriate parameters for dose adjustment based on platelet counts. Recently his platelets have been stable with 1 mcg/kg that is 100  g subcutaneous dose weekly . -Continue to monitor labs weekly.  #2 acute bronchitis -Given prescription for Z-Pak and prednisone for 5 days. -Counseled to call if he does not feel better or if there is high-grade fevers or chills or increasing shortness of breath.   -Return to care for follow-up in 4 weeks.  #3 other medical co-morbidities Patient notes that he is in the  process of changing primary care physicians to one that visits his skin nursing facility .  Total time spent 15 minutes more than 50% time on direct patient contact counseling and coordination of care.  Return to care with Dr. Irene Limbo in 4 weeks Continue follow-up for weekly Romiplostim and labs.   Sullivan Lone MD [place] Hematology/Oncology Physician [facility]  (Office):       [phone] (Work cell):  [phone] (Fax):           [phone]

## 2015-03-21 ENCOUNTER — Other Ambulatory Visit (HOSPITAL_BASED_OUTPATIENT_CLINIC_OR_DEPARTMENT_OTHER): Payer: Medicare Other

## 2015-03-21 ENCOUNTER — Ambulatory Visit (HOSPITAL_BASED_OUTPATIENT_CLINIC_OR_DEPARTMENT_OTHER): Payer: Medicare Other

## 2015-03-21 VITALS — BP 148/56 | HR 44 | Temp 98.3°F

## 2015-03-21 DIAGNOSIS — N189 Chronic kidney disease, unspecified: Secondary | ICD-10-CM

## 2015-03-21 DIAGNOSIS — D693 Immune thrombocytopenic purpura: Secondary | ICD-10-CM | POA: Diagnosis not present

## 2015-03-21 LAB — COMPREHENSIVE METABOLIC PANEL
ALBUMIN: 3.5 g/dL (ref 3.5–5.0)
ALK PHOS: 68 U/L (ref 40–150)
ALT: 19 U/L (ref 0–55)
AST: 21 U/L (ref 5–34)
Anion Gap: 7 mEq/L (ref 3–11)
BILIRUBIN TOTAL: 0.35 mg/dL (ref 0.20–1.20)
BUN: 24.8 mg/dL (ref 7.0–26.0)
CALCIUM: 9.4 mg/dL (ref 8.4–10.4)
CO2: 24 mEq/L (ref 22–29)
Chloride: 107 mEq/L (ref 98–109)
Creatinine: 1.5 mg/dL — ABNORMAL HIGH (ref 0.7–1.3)
EGFR: 40 mL/min/{1.73_m2} — ABNORMAL LOW (ref >90)
GLUCOSE: 107 mg/dL (ref 70–140)
Potassium: 4.6 mEq/L (ref 3.5–5.1)
SODIUM: 138 meq/L (ref 136–145)
TOTAL PROTEIN: 6.7 g/dL (ref 6.4–8.3)

## 2015-03-21 LAB — CBC WITH DIFFERENTIAL/PLATELET
BASO%: 0.5 % (ref 0.0–2.0)
BASOS ABS: 0 10*3/uL (ref 0.0–0.1)
EOS ABS: 0.1 10*3/uL (ref 0.0–0.5)
EOS%: 0.7 % (ref 0.0–7.0)
HEMATOCRIT: 35.3 % — AB (ref 38.4–49.9)
HEMOGLOBIN: 11.6 g/dL — AB (ref 13.0–17.1)
LYMPH%: 20.6 % (ref 14.0–49.0)
MCH: 31.5 pg (ref 27.2–33.4)
MCHC: 32.9 g/dL (ref 32.0–36.0)
MCV: 95.7 fL (ref 79.3–98.0)
MONO#: 1 10*3/uL — ABNORMAL HIGH (ref 0.1–0.9)
MONO%: 11.8 % (ref 0.0–14.0)
NEUT#: 5.8 10*3/uL (ref 1.5–6.5)
NEUT%: 66.4 % (ref 39.0–75.0)
Platelets: 247 10*3/uL (ref 140–400)
RBC: 3.69 10*6/uL — ABNORMAL LOW (ref 4.20–5.82)
RDW: 13.2 % (ref 11.0–14.6)
WBC: 8.7 10*3/uL (ref 4.0–10.3)
lymph#: 1.8 10*3/uL (ref 0.9–3.3)

## 2015-03-21 MED ORDER — ROMIPLOSTIM 250 MCG ~~LOC~~ SOLR
2.0000 ug/kg | SUBCUTANEOUS | Status: DC
  Administered 2015-03-21: 195 ug via SUBCUTANEOUS
  Filled 2015-03-21: qty 0.39

## 2015-03-28 ENCOUNTER — Other Ambulatory Visit (HOSPITAL_BASED_OUTPATIENT_CLINIC_OR_DEPARTMENT_OTHER): Payer: Medicare Other

## 2015-03-28 ENCOUNTER — Ambulatory Visit (HOSPITAL_BASED_OUTPATIENT_CLINIC_OR_DEPARTMENT_OTHER): Payer: Medicare Other

## 2015-03-28 VITALS — BP 160/53 | HR 49 | Temp 97.7°F

## 2015-03-28 DIAGNOSIS — D693 Immune thrombocytopenic purpura: Secondary | ICD-10-CM

## 2015-03-28 LAB — CBC WITH DIFFERENTIAL/PLATELET
BASO%: 0.3 % (ref 0.0–2.0)
BASOS ABS: 0 10*3/uL (ref 0.0–0.1)
EOS%: 1.7 % (ref 0.0–7.0)
Eosinophils Absolute: 0.1 10*3/uL (ref 0.0–0.5)
HCT: 35.6 % — ABNORMAL LOW (ref 38.4–49.9)
HGB: 11.7 g/dL — ABNORMAL LOW (ref 13.0–17.1)
LYMPH%: 21 % (ref 14.0–49.0)
MCH: 31.5 pg (ref 27.2–33.4)
MCHC: 32.9 g/dL (ref 32.0–36.0)
MCV: 96 fL (ref 79.3–98.0)
MONO#: 0.9 10*3/uL (ref 0.1–0.9)
MONO%: 12.4 % (ref 0.0–14.0)
NEUT#: 4.5 10*3/uL (ref 1.5–6.5)
NEUT%: 64.6 % (ref 39.0–75.0)
NRBC: 0 % (ref 0–0)
Platelets: 244 10*3/uL (ref 140–400)
RBC: 3.71 10*6/uL — AB (ref 4.20–5.82)
RDW: 13.3 % (ref 11.0–14.6)
WBC: 7 10*3/uL (ref 4.0–10.3)
lymph#: 1.5 10*3/uL (ref 0.9–3.3)

## 2015-03-28 MED ORDER — ROMIPLOSTIM 250 MCG ~~LOC~~ SOLR
2.0000 ug/kg | SUBCUTANEOUS | Status: DC
  Administered 2015-03-28: 195 ug via SUBCUTANEOUS
  Filled 2015-03-28: qty 0.39

## 2015-04-04 ENCOUNTER — Ambulatory Visit (HOSPITAL_BASED_OUTPATIENT_CLINIC_OR_DEPARTMENT_OTHER): Payer: Medicare Other

## 2015-04-04 ENCOUNTER — Other Ambulatory Visit (HOSPITAL_BASED_OUTPATIENT_CLINIC_OR_DEPARTMENT_OTHER): Payer: Medicare Other

## 2015-04-04 VITALS — BP 155/45 | HR 56 | Temp 98.2°F

## 2015-04-04 DIAGNOSIS — D693 Immune thrombocytopenic purpura: Secondary | ICD-10-CM | POA: Diagnosis not present

## 2015-04-04 LAB — CBC WITH DIFFERENTIAL/PLATELET
BASO%: 0 % (ref 0.0–2.0)
BASOS ABS: 0 10*3/uL (ref 0.0–0.1)
EOS%: 1.4 % (ref 0.0–7.0)
Eosinophils Absolute: 0.1 10*3/uL (ref 0.0–0.5)
HCT: 33.9 % — ABNORMAL LOW (ref 38.4–49.9)
HEMOGLOBIN: 11.1 g/dL — AB (ref 13.0–17.1)
LYMPH#: 1.3 10*3/uL (ref 0.9–3.3)
LYMPH%: 21.4 % (ref 14.0–49.0)
MCH: 31.6 pg (ref 27.2–33.4)
MCHC: 32.7 g/dL (ref 32.0–36.0)
MCV: 96.6 fL (ref 79.3–98.0)
MONO#: 0.6 10*3/uL (ref 0.1–0.9)
MONO%: 10.2 % (ref 0.0–14.0)
NEUT%: 67 % (ref 39.0–75.0)
NEUTROS ABS: 4 10*3/uL (ref 1.5–6.5)
NRBC: 0 % (ref 0–0)
Platelets: 278 10*3/uL (ref 140–400)
RBC: 3.51 10*6/uL — AB (ref 4.20–5.82)
RDW: 13.4 % (ref 11.0–14.6)
WBC: 5.9 10*3/uL (ref 4.0–10.3)

## 2015-04-04 MED ORDER — ROMIPLOSTIM 250 MCG ~~LOC~~ SOLR
100.0000 ug | SUBCUTANEOUS | Status: DC
  Administered 2015-04-04: 100 ug via SUBCUTANEOUS
  Filled 2015-04-04: qty 0.2

## 2015-04-11 ENCOUNTER — Ambulatory Visit (HOSPITAL_BASED_OUTPATIENT_CLINIC_OR_DEPARTMENT_OTHER): Payer: Medicare Other

## 2015-04-11 ENCOUNTER — Telehealth: Payer: Self-pay | Admitting: Hematology

## 2015-04-11 ENCOUNTER — Ambulatory Visit (HOSPITAL_BASED_OUTPATIENT_CLINIC_OR_DEPARTMENT_OTHER): Payer: Medicare Other | Admitting: Hematology

## 2015-04-11 ENCOUNTER — Encounter: Payer: Self-pay | Admitting: Hematology

## 2015-04-11 ENCOUNTER — Ambulatory Visit (HOSPITAL_COMMUNITY)
Admission: RE | Admit: 2015-04-11 | Discharge: 2015-04-11 | Disposition: A | Discharge status: Home / Self Care | Payer: Medicare Other | Source: Ambulatory Visit | Attending: Hematology | Admitting: Hematology

## 2015-04-11 ENCOUNTER — Other Ambulatory Visit (HOSPITAL_BASED_OUTPATIENT_CLINIC_OR_DEPARTMENT_OTHER): Payer: Medicare Other

## 2015-04-11 ENCOUNTER — Other Ambulatory Visit: Payer: Medicare Other

## 2015-04-11 VITALS — BP 169/51 | HR 53 | Temp 97.4°F | Resp 18 | Ht 67.5 in | Wt 218.3 lb

## 2015-04-11 DIAGNOSIS — R0602 Shortness of breath: Secondary | ICD-10-CM

## 2015-04-11 DIAGNOSIS — X58XXXA Exposure to other specified factors, initial encounter: Secondary | ICD-10-CM | POA: Insufficient documentation

## 2015-04-11 DIAGNOSIS — S2242XA Multiple fractures of ribs, left side, initial encounter for closed fracture: Secondary | ICD-10-CM | POA: Diagnosis not present

## 2015-04-11 DIAGNOSIS — E875 Hyperkalemia: Secondary | ICD-10-CM | POA: Diagnosis not present

## 2015-04-11 DIAGNOSIS — M8580 Other specified disorders of bone density and structure, unspecified site: Secondary | ICD-10-CM | POA: Diagnosis not present

## 2015-04-11 DIAGNOSIS — I2510 Atherosclerotic heart disease of native coronary artery without angina pectoris: Secondary | ICD-10-CM

## 2015-04-11 DIAGNOSIS — D693 Immune thrombocytopenic purpura: Secondary | ICD-10-CM

## 2015-04-11 DIAGNOSIS — N189 Chronic kidney disease, unspecified: Secondary | ICD-10-CM | POA: Diagnosis not present

## 2015-04-11 DIAGNOSIS — S2232XA Fracture of one rib, left side, initial encounter for closed fracture: Secondary | ICD-10-CM | POA: Insufficient documentation

## 2015-04-11 LAB — CBC WITH DIFFERENTIAL/PLATELET
BASO%: 0 % (ref 0.0–2.0)
BASOS ABS: 0 10*3/uL (ref 0.0–0.1)
EOS%: 1.2 % (ref 0.0–7.0)
Eosinophils Absolute: 0.1 10*3/uL (ref 0.0–0.5)
HCT: 33.3 % — ABNORMAL LOW (ref 38.4–49.9)
HGB: 10.7 g/dL — ABNORMAL LOW (ref 13.0–17.1)
LYMPH#: 1.3 10*3/uL (ref 0.9–3.3)
LYMPH%: 21.5 % (ref 14.0–49.0)
MCH: 31.2 pg (ref 27.2–33.4)
MCHC: 32.1 g/dL (ref 32.0–36.0)
MCV: 97.1 fL (ref 79.3–98.0)
MONO#: 0.6 10*3/uL (ref 0.1–0.9)
MONO%: 9.2 % (ref 0.0–14.0)
NEUT#: 4.1 10*3/uL (ref 1.5–6.5)
NEUT%: 68.1 % (ref 39.0–75.0)
Platelets: 267 10*3/uL (ref 140–400)
RBC: 3.43 10*6/uL — AB (ref 4.20–5.82)
RDW: 13.3 % (ref 11.0–14.6)
WBC: 6 10*3/uL (ref 4.0–10.3)
nRBC: 0 % (ref 0–0)

## 2015-04-11 LAB — COMPREHENSIVE METABOLIC PANEL
ALK PHOS: 61 U/L (ref 40–150)
ALT: 13 U/L (ref 0–55)
AST: 16 U/L (ref 5–34)
Albumin: 3.5 g/dL (ref 3.5–5.0)
Anion Gap: 8 mEq/L (ref 3–11)
BUN: 19.8 mg/dL (ref 7.0–26.0)
CHLORIDE: 108 meq/L (ref 98–109)
CO2: 26 meq/L (ref 22–29)
Calcium: 9.5 mg/dL (ref 8.4–10.4)
Creatinine: 1.6 mg/dL — ABNORMAL HIGH (ref 0.7–1.3)
EGFR: 37 mL/min/{1.73_m2} — AB (ref >90)
GLUCOSE: 108 mg/dL (ref 70–140)
POTASSIUM: 5.3 meq/L — AB (ref 3.5–5.1)
SODIUM: 141 meq/L (ref 136–145)
Total Bilirubin: 0.49 mg/dL (ref 0.20–1.20)
Total Protein: 6.7 g/dL (ref 6.4–8.3)

## 2015-04-11 MED ORDER — CYANOCOBALAMIN 1000 MCG/ML IJ SOLN
1000.0000 ug | INTRAMUSCULAR | Status: DC
  Administered 2015-04-11: 1000 ug via SUBCUTANEOUS

## 2015-04-11 MED ORDER — ROMIPLOSTIM 250 MCG ~~LOC~~ SOLR
1.0000 ug/kg | SUBCUTANEOUS | Status: DC
  Administered 2015-04-11: 100 ug via SUBCUTANEOUS
  Filled 2015-04-11: qty 0.2

## 2015-04-11 MED ORDER — FUROSEMIDE 40 MG PO TABS: 40.0000 mg | ORAL_TABLET | Freq: Every day | ORAL | Status: DC

## 2015-04-11 NOTE — Telephone Encounter (Signed)
per pof to sch pt appt-gave pt copy of avs °

## 2015-04-11 NOTE — Telephone Encounter (Signed)
per pof to sch pt appt-sent back ot lab-pt to Radiology afterwards

## 2015-04-14 NOTE — Progress Notes (Signed)
.    Hematology oncology clinic note  Date of service: .04/11/2015  Patient Care Team: No Pcp Per Patient as PCP - General (General Practice)  CHIEF COMPLAINTS/PURPOSE OF CONSULTATION: Follow-up for ITP   Diagnosis: Idiopathic thrombocytopenic purpura   Current treatment: Nplate weekly to maintain reasonable platelet counts >50K. Requirement have ranged from 85mcg/kg to 4mcg/kg.  Previous treatment: Steroids, IVIG.  HISTORY OF PRESENTING ILLNESS:  please see my previous clinic note from 09/28/2014 for details of initial presentation and course of treatment.  Interval history  Patient is here for follow-up regarding his ITP. His platelet counts remain stable on 1 mcg/kg dose of Nplate without any issues with bleeding. As that he has been gaining some weight, developed some leg swelling and notes some gradually increasing dyspnea on exertion. Cough has improved. No fevers or chills. Notes that he has been drinking a lot of water with every meal.     MEDICAL HISTORY:  Past Medical History  Diagnosis Date  . BPH (benign prostatic hypertrophy)   . Chronic renal insufficiency   . Coronary artery disease     CABG in 2000  . Diabetes type 2, controlled ([place])     Diet-controlled  . Diabetic neuropathy ([place])   . Hypertension   . Dyslipidemia   . ITP (idiopathic thrombocytopenic purpura)   . Osteoarthritis     SURGICAL HISTORY: Past Surgical History  Procedure Laterality Date  . Cholecystectomy    . Tonsillectomy    . Right hip replacement  2014  . Coronary artery bypass graft  2000    SOCIAL HISTORY: Social History   Social History  . Marital Status: Unknown    Spouse Name: N/A  . Number of Children: N/A  . Years of Education: N/A   Occupational History  . Not on file.   Social History Main Topics  . Smoking status: Former Smoker -- 2.00 packs/day for 25 years  . Smokeless tobacco: Not on file  . Alcohol Use: No  . Drug Use: Not on file  . Sexual Activity: Not  Currently   Other Topics Concern  . Not on file   Social History Narrative   He is living in [place] which is an assisted living facility. One of his son is also moving to [facility] and has a Designer, jewellery in religious studies. Patient notes that his other son is a Systems analyst with multiple awards.  FAMILY HISTORY: History reviewed. No pertinent family history.  ALLERGIES:  is allergic to penicillins.  MEDICATIONS:  Current Outpatient Prescriptions  Medication Sig Dispense Refill  . acetaminophen (TYLENOL) 500 MG tablet Take 250 mg by mouth 2 (two) times daily.     Marland Kitchen azithromycin (ZITHROMAX) 250 MG tablet 500mg  po day 1 then 250mg  po daily for 4 days 6 each 0  . Cholecalciferol (VITAMIN D-1000 MAX ST) 1000 UNITS tablet Take 1 tablet (1,000 Units total) by mouth daily.    Marland Kitchen losartan (COZAAR) 100 MG tablet Take 100 mg by mouth daily.    . magnesium oxide (MAG-OX) 400 (241.3 Mg) MG tablet Take 1 tablet (400 mg total) by mouth daily. 30 tablet 0  . metoprolol tartrate (LOPRESSOR) 25 MG tablet Take 6.25 mg by mouth 2 (two) times daily.     Marland Kitchen omeprazole (PRILOSEC) 20 MG capsule Take 20 mg by mouth daily.    Marland Kitchen oxyCODONE-acetaminophen (PERCOCET/ROXICET) 5-325 MG per tablet Take 1-2 tablets by mouth every 4 (four) hours as needed for severe pain. 12 tablet 0  .  simvastatin (ZOCOR) 20 MG tablet Take 20 mg by mouth daily.    . sodium chloride (OCEAN) 0.65 % SOLN nasal spray Place 1 spray into both nostrils as needed for congestion. 30 mL 2  . furosemide (LASIX) 40 MG tablet Take 1 tablet (40 mg total) by mouth daily. 30 tablet 0   No current facility-administered medications for this visit.   Romiplostim 5 mcg/kg weekly   REVIEW OF SYSTEMS:   10 point review of system is negative except as noted above  PHYSICAL EXAMINATION: ECOG PERFORMANCE STATUS: 1 - Symptomatic but completely ambulatory  Filed Vitals:   04/11/15 1029  BP: 169/51  Pulse: 53  Temp: 97.4 F (36.3 C)    Resp: 18   Filed Weights   04/11/15 1029  Weight: 218 lb 4.8 oz (99.02 kg)   GENERAL: Elderly gentleman in no acute distress:alert and comfortable SKIN: skin color, texture, turgor are normal, no rashes or significant lesions EYES: normal, conjunctiva are pink and non-injected, sclera clear OROPHARYNX:no exudate, no erythema and lips, buccal mucosa, and tongue normal  NECK: supple, thyroid normal size, non-tender, without nodularity LYMPH:  no palpable lymphadenopathy in the cervical, axillary or inguinal LUNGS: Air entry bilaterally equal, few basilar rales less than one fourth up chest wall posteriorly. HEART: regular rate & rhythm and 2 x 6 systolic murmur over aortic area, and no lower extremity edema ABDOMEN:abdomen soft, non-tender and normal bowel sounds Musculoskeletal:no cyanosis of digits and no clubbing  PSYCH: alert & oriented x 3 with fluent speech NEURO: no focal motor/sensory deficits  LABORATORY DATA:   . CBC Latest Ref Rng 04/11/2015 04/04/2015 03/28/2015  WBC 4.0 - 10.3 10e3/uL 6.0 5.9 7.0  Hemoglobin 13.0 - 17.1 g/dL 10.7(L) 11.1(L) 11.7(L)  Hematocrit 38.4 - 49.9 % 33.3(L) 33.9(L) 35.6(L)  Platelets 140 - 400 10e3/uL 267 278 244    . CMP Latest Ref Rng 04/11/2015 03/21/2015 02/28/2015  Glucose 70 - 140 mg/dl 108 107 150(H)  BUN 7.0 - 26.0 mg/dL 19.8 24.8 18.8  Creatinine 0.7 - 1.3 mg/dL 1.6(H) 1.5(H) 1.5(H)  Sodium 136 - 145 mEq/L 141 138 139  Potassium 3.5 - 5.1 mEq/L 5.3(H) 4.6 5.1  CO2 22 - 29 mEq/L 26 24 24   Calcium 8.4 - 10.4 mg/dL 9.5 9.4 9.5  Total Protein 6.4 - 8.3 g/dL 6.7 6.7 6.6  Total Bilirubin 0.20 - 1.20 mg/dL 0.49 0.35 0.43  Alkaline Phos 40 - 150 U/L 61 68 72  AST 5 - 34 U/L 16 21 22   ALT 0 - 55 U/L 13 19 19     ASSESSMENT & PLAN:   82 year old Caucasian male with multiple medical comorbidities with  #1 chronic ITP since 1989. Has previously been responsive to steroids and has received IVIG on one occasion. He has been maintained on  3-23mcg/kg weekly of Nplate since mid 89FGE initially with Dr. Arvin Collard at [facility] and then with Dr. Jimmie Molly at [facility] in [place].  No issues with bleeding . Ultrasound abdomen showed normal spleen size SPEP- no obvious monoclonal protein. IFE showed possibility of an restrictive bands in the IgG and Lambda lanes. Patient's platelet counts have remained fairly stable at Romiplostim doses of 65mcg/kg. Plan  -The patient's platelet counts are more than 200,000 on the next visi will consider holding his Nplate and repeating counts in one week. -If this shows signs of dropping again we'll need to keep him on the 1 mcg/kg of Nplate  -Continue to monitor labs weekly.  #2 acute bronchitis  was treated with Z-Pak and prednisone for 5 days . Notes that his cough is gone away. Has not had any fevers or chills .  #3  Coronary artery disease status post CABG in year 2000. Not on aspirin due to his ITP . Increasing dyspnea on exertion . He gradually by about 4 pounds in the last few months along with some increasing leg swelling. Wt Readings from Last 3 Encounters:  04/11/15 218 lb 4.8 oz (99.02 kg)  03/14/15 215 lb 1.6 oz (97.569 kg)  01/10/15 214 lb (97.07 kg)   plan -CMP and BNP today -started the patient on Lasix 40 mg daily given his peripheral edema and volume overload state as well as uncontrolled blood pressure which might be volume sensitive. -Patient was again encouraged to establish and follow up with his primary care physician for ongoing management of his hypertension, coronary disease, diabetes which has been diet controlled and other medical comorbidities. He was to follow-up with his primary care physician at his assisted living but has now set up a primary care physician at the [facility] system and has an upcoming appointment in the next couple weeks. -Echocardiogram to check for CHF  #4 chronic kidney disease - creatinine overall stable. Mild signs of hyperkalemia  potassium 5.3 .. Volume overload and hyperkalemia will likely be addressed by his Lasix. Counseled on low salt intake. Needs to establish close follow-up with his primary care physician  -Return to care for follow-up in 2 weeks to reassess his respiratory status. Earlier if any new concerns. Patient was recommended to go to the emergency room to seek immediate attention with increasing shortness of breath or chest pain.  Total time spent 25 minutes more than 50% time on direct patient contact counseling and coordination of care.  Return to care with Dr. Irene Limbo in 4 weeks Continue follow-up for weekly Romiplostim and labs.   Sullivan Lone MD [place] Hematology/Oncology Physician [facility]  (Office):       [phone] (Work cell):  [phone] (Fax):           [phone]

## 2015-04-18 ENCOUNTER — Other Ambulatory Visit (HOSPITAL_BASED_OUTPATIENT_CLINIC_OR_DEPARTMENT_OTHER): Payer: Medicare Other

## 2015-04-18 ENCOUNTER — Ambulatory Visit (HOSPITAL_BASED_OUTPATIENT_CLINIC_OR_DEPARTMENT_OTHER): Payer: Medicare Other

## 2015-04-18 VITALS — BP 160/56 | HR 52 | Temp 97.8°F

## 2015-04-18 DIAGNOSIS — R0602 Shortness of breath: Secondary | ICD-10-CM

## 2015-04-18 DIAGNOSIS — D693 Immune thrombocytopenic purpura: Secondary | ICD-10-CM | POA: Diagnosis not present

## 2015-04-18 LAB — CBC WITH DIFFERENTIAL/PLATELET
BASO%: 0.4 % (ref 0.0–2.0)
Basophils Absolute: 0 10*3/uL (ref 0.0–0.1)
EOS ABS: 0.1 10*3/uL (ref 0.0–0.5)
EOS%: 1.1 % (ref 0.0–7.0)
HCT: 36.3 % — ABNORMAL LOW (ref 38.4–49.9)
HEMOGLOBIN: 11.9 g/dL — AB (ref 13.0–17.1)
LYMPH%: 21.3 % (ref 14.0–49.0)
MCH: 31.5 pg (ref 27.2–33.4)
MCHC: 32.9 g/dL (ref 32.0–36.0)
MCV: 95.8 fL (ref 79.3–98.0)
MONO#: 0.7 10*3/uL (ref 0.1–0.9)
MONO%: 12 % (ref 0.0–14.0)
NEUT%: 65.2 % (ref 39.0–75.0)
NEUTROS ABS: 3.7 10*3/uL (ref 1.5–6.5)
Platelets: 164 10*3/uL (ref 140–400)
RBC: 3.79 10*6/uL — AB (ref 4.20–5.82)
RDW: 13 % (ref 11.0–14.6)
WBC: 5.7 10*3/uL (ref 4.0–10.3)
lymph#: 1.2 10*3/uL (ref 0.9–3.3)

## 2015-04-18 MED ORDER — ROMIPLOSTIM 250 MCG ~~LOC~~ SOLR
1.0000 ug/kg | SUBCUTANEOUS | Status: DC
  Administered 2015-04-18: 100 ug via SUBCUTANEOUS
  Filled 2015-04-18: qty 0.2

## 2015-04-19 LAB — BRAIN NATRIURETIC PEPTIDE: BNP: 339.9 pg/mL — ABNORMAL HIGH (ref 0.0–100.0)

## 2015-04-20 ENCOUNTER — Telehealth: Payer: Self-pay | Admitting: Hematology

## 2015-04-20 ENCOUNTER — Other Ambulatory Visit: Payer: Self-pay | Admitting: *Deleted

## 2015-04-20 NOTE — Telephone Encounter (Signed)
Finished adding injection schedule per pof

## 2015-04-25 ENCOUNTER — Ambulatory Visit (HOSPITAL_BASED_OUTPATIENT_CLINIC_OR_DEPARTMENT_OTHER): Payer: Medicare Other

## 2015-04-25 ENCOUNTER — Other Ambulatory Visit: Payer: Self-pay | Admitting: *Deleted

## 2015-04-25 ENCOUNTER — Other Ambulatory Visit (HOSPITAL_BASED_OUTPATIENT_CLINIC_OR_DEPARTMENT_OTHER): Payer: Medicare Other

## 2015-04-25 ENCOUNTER — Telehealth: Payer: Self-pay | Admitting: Hematology

## 2015-04-25 ENCOUNTER — Ambulatory Visit (HOSPITAL_BASED_OUTPATIENT_CLINIC_OR_DEPARTMENT_OTHER): Payer: Medicare Other | Admitting: Hematology

## 2015-04-25 ENCOUNTER — Ambulatory Visit (HOSPITAL_COMMUNITY)
Admission: RE | Admit: 2015-04-25 | Discharge: 2015-04-25 | Disposition: A | Discharge status: Home / Self Care | Payer: Medicare Other | Source: Ambulatory Visit | Attending: Hematology | Admitting: Hematology

## 2015-04-25 ENCOUNTER — Encounter: Payer: Self-pay | Admitting: Hematology

## 2015-04-25 VITALS — BP 146/51 | HR 57 | Temp 97.9°F | Resp 19 | Wt 214.7 lb

## 2015-04-25 DIAGNOSIS — I081 Rheumatic disorders of both mitral and tricuspid valves: Secondary | ICD-10-CM | POA: Diagnosis not present

## 2015-04-25 DIAGNOSIS — D693 Immune thrombocytopenic purpura: Secondary | ICD-10-CM

## 2015-04-25 DIAGNOSIS — R252 Cramp and spasm: Secondary | ICD-10-CM | POA: Diagnosis not present

## 2015-04-25 DIAGNOSIS — I2510 Atherosclerotic heart disease of native coronary artery without angina pectoris: Secondary | ICD-10-CM | POA: Diagnosis not present

## 2015-04-25 DIAGNOSIS — R600 Localized edema: Secondary | ICD-10-CM | POA: Insufficient documentation

## 2015-04-25 DIAGNOSIS — R0602 Shortness of breath: Secondary | ICD-10-CM | POA: Insufficient documentation

## 2015-04-25 DIAGNOSIS — I10 Essential (primary) hypertension: Secondary | ICD-10-CM | POA: Insufficient documentation

## 2015-04-25 DIAGNOSIS — Z951 Presence of aortocoronary bypass graft: Secondary | ICD-10-CM | POA: Insufficient documentation

## 2015-04-25 DIAGNOSIS — R0600 Dyspnea, unspecified: Secondary | ICD-10-CM | POA: Diagnosis not present

## 2015-04-25 DIAGNOSIS — N189 Chronic kidney disease, unspecified: Secondary | ICD-10-CM | POA: Diagnosis not present

## 2015-04-25 LAB — CBC WITH DIFFERENTIAL/PLATELET
BASO%: 0.3 % (ref 0.0–2.0)
BASOS ABS: 0 10*3/uL (ref 0.0–0.1)
EOS ABS: 0.1 10*3/uL (ref 0.0–0.5)
EOS%: 1.1 % (ref 0.0–7.0)
HEMATOCRIT: 37.1 % — AB (ref 38.4–49.9)
HEMOGLOBIN: 12 g/dL — AB (ref 13.0–17.1)
LYMPH#: 1.1 10*3/uL (ref 0.9–3.3)
LYMPH%: 21.5 % (ref 14.0–49.0)
MCH: 31.4 pg (ref 27.2–33.4)
MCHC: 32.3 g/dL (ref 32.0–36.0)
MCV: 97.2 fL (ref 79.3–98.0)
MONO#: 0.6 10*3/uL (ref 0.1–0.9)
MONO%: 11.5 % (ref 0.0–14.0)
NEUT#: 3.3 10*3/uL (ref 1.5–6.5)
NEUT%: 65.6 % (ref 39.0–75.0)
Platelets: 166 10*3/uL (ref 140–400)
RBC: 3.82 10*6/uL — AB (ref 4.20–5.82)
RDW: 13.4 % (ref 11.0–14.6)
WBC: 5 10*3/uL (ref 4.0–10.3)

## 2015-04-25 LAB — COMPREHENSIVE METABOLIC PANEL
ALT: 16 U/L (ref 0–55)
ANION GAP: 8 meq/L (ref 3–11)
AST: 19 U/L (ref 5–34)
Albumin: 3.4 g/dL — ABNORMAL LOW (ref 3.5–5.0)
Alkaline Phosphatase: 67 U/L (ref 40–150)
BILIRUBIN TOTAL: 0.43 mg/dL (ref 0.20–1.20)
BUN: 21.5 mg/dL (ref 7.0–26.0)
CALCIUM: 9.2 mg/dL (ref 8.4–10.4)
CO2: 24 meq/L (ref 22–29)
CREATININE: 1.5 mg/dL — AB (ref 0.7–1.3)
Chloride: 107 mEq/L (ref 98–109)
EGFR: 39 mL/min/{1.73_m2} — AB (ref >90)
Glucose: 140 mg/dl (ref 70–140)
Potassium: 4.7 mEq/L (ref 3.5–5.1)
Sodium: 139 mEq/L (ref 136–145)
TOTAL PROTEIN: 6.5 g/dL (ref 6.4–8.3)

## 2015-04-25 MED ORDER — ROMIPLOSTIM 250 MCG ~~LOC~~ SOLR
100.0000 ug | SUBCUTANEOUS | Status: DC
  Administered 2015-04-25: 100 ug via SUBCUTANEOUS
  Filled 2015-04-25: qty 0.2

## 2015-04-25 MED ORDER — PERFLUTREN LIPID MICROSPHERE
INTRAVENOUS | Status: AC
  Filled 2015-04-25: qty 10

## 2015-04-25 MED ORDER — MAGNESIUM CHLORIDE 64 MG PO TBEC: 1.0000 | DELAYED_RELEASE_TABLET | Freq: Every day | ORAL | Status: DC

## 2015-04-25 NOTE — Progress Notes (Signed)
Echocardiogram 2D Echocardiogram with Definity has been performed.  Tresa Res 04/25/2015, 12:40 PM

## 2015-04-25 NOTE — Telephone Encounter (Signed)
appt made and avs printed °

## 2015-04-26 NOTE — Progress Notes (Signed)
.    Hematology oncology clinic note  Date of service: .04/25/2015  Patient Care Team: No Pcp Per Patient as PCP - General (General Practice)  CHIEF COMPLAINTS/PURPOSE OF CONSULTATION: Follow-up for ITP   Diagnosis: Idiopathic thrombocytopenic purpura   Current treatment: Nplate weekly to maintain reasonable platelet counts >50K. Requirement have ranged from 69mcg/kg to 29mcg/kg. Recently stable counts with 33mcg/kg  Previous treatment: Steroids, IVIG.  HISTORY OF PRESENTING ILLNESS:  please see my previous clinic note from 09/28/2014 for details of initial presentation and course of treatment.  Interval history  Patient is here for follow-up regarding his ITP. His platelets have remained stable on 39mcg/kg of NPlate. Breathing improved with lasix - had dropped 4 lbs of fluid weight. Stoppped taking his lasix due to leg cramps -- though he had these prior to lasix. Was not taking his magnesium as ordered. ECHO with slightly reduced cardiac function. He was again told about the need for him to establish a PCP - he is noted to have a PCP f/u on 05/04/2015. No bleeding issues.    MEDICAL HISTORY:  Past Medical History  Diagnosis Date  . BPH (benign prostatic hypertrophy)   . Chronic renal insufficiency   . Coronary artery disease     CABG in 2000  . Diabetes type 2, controlled ([place])     Diet-controlled  . Diabetic neuropathy ([place])   . Hypertension   . Dyslipidemia   . ITP (idiopathic thrombocytopenic purpura)   . Osteoarthritis     SURGICAL HISTORY: Past Surgical History  Procedure Laterality Date  . Cholecystectomy    . Tonsillectomy    . Right hip replacement  2014  . Coronary artery bypass graft  2000    SOCIAL HISTORY: Social History   Social History  . Marital Status: Unknown    Spouse Name: N/A  . Number of Children: N/A  . Years of Education: N/A   Occupational History  . Not on file.   Social History Main Topics  . Smoking status: Former Smoker -- 2.00  packs/day for 25 years  . Smokeless tobacco: Not on file  . Alcohol Use: No  . Drug Use: Not on file  . Sexual Activity: Not Currently   Other Topics Concern  . Not on file   Social History Narrative   He is living in [place] which is an assisted living facility. One of his son is also moving to [facility] and has a Designer, jewellery in religious studies. Patient notes that his other son is a Systems analyst with multiple awards.  FAMILY HISTORY: History reviewed. No pertinent family history.  ALLERGIES:  is allergic to penicillins.  MEDICATIONS:  Current Outpatient Prescriptions  Medication Sig Dispense Refill  . acetaminophen (TYLENOL) 500 MG tablet Take 250 mg by mouth 2 (two) times daily.     . Ascorbic Acid (VITAMIN C) 100 MG tablet Take 100 mg by mouth daily.    . Cholecalciferol (VITAMIN D-1000 MAX ST) 1000 UNITS tablet Take 1 tablet (1,000 Units total) by mouth daily.    Marland Kitchen losartan (COZAAR) 100 MG tablet Take 100 mg by mouth daily.    . metoprolol tartrate (LOPRESSOR) 25 MG tablet Take 6.25 mg by mouth 2 (two) times daily.     Marland Kitchen omeprazole (PRILOSEC) 20 MG capsule Take 20 mg by mouth daily.    Marland Kitchen oxyCODONE-acetaminophen (PERCOCET/ROXICET) 5-325 MG per tablet Take 1-2 tablets by mouth every 4 (four) hours as needed for severe pain. 12 tablet 0  .  simvastatin (ZOCOR) 20 MG tablet Take 20 mg by mouth daily.    . sodium chloride (OCEAN) 0.65 % SOLN nasal spray Place 1 spray into both nostrils as needed for congestion. 30 mL 2  . furosemide (LASIX) 40 MG tablet Take 1 tablet (40 mg total) by mouth daily. (Patient not taking: Reported on 04/25/2015) 30 tablet 0  . magnesium chloride (SLOW-MAG) 64 MG TBEC SR tablet Take 1 tablet (64 mg total) by mouth daily. 60 tablet 1   No current facility-administered medications for this visit.   Romiplostim 5 mcg/kg weekly   REVIEW OF SYSTEMS:   10 point review of system is negative except as noted above  PHYSICAL EXAMINATION: ECOG  PERFORMANCE STATUS: 1 - Symptomatic but completely ambulatory  Filed Vitals:   04/25/15 0933  BP: 146/51  Pulse: 57  Temp: 97.9 F (36.6 C)  Resp: 19   Filed Weights   04/25/15 0933  Weight: 214 lb 11.2 oz (97.387 kg)   GENERAL: Elderly gentleman in no acute distress:alert and comfortable SKIN: skin color, texture, turgor are normal, no rashes or significant lesions EYES: normal, conjunctiva are pink and non-injected, sclera clear OROPHARYNX:no exudate, no erythema and lips, buccal mucosa, and tongue normal  NECK: supple, thyroid normal size, non-tender, without nodularity LYMPH:  no palpable lymphadenopathy in the cervical, axillary or inguinal LUNGS: Air entry bilaterally equal, minimal b/l basal rales improved from last visit HEART: regular rate & rhythm and 2 x 6 systolic murmur over aortic area, and no lower extremity edema ABDOMEN:abdomen soft, non-tender and normal bowel sounds Musculoskeletal:no cyanosis of digits and no clubbing  PSYCH: alert & oriented x 3 with fluent speech NEURO: no focal motor/sensory deficits  LABORATORY DATA:   . CBC Latest Ref Rng 04/25/2015 04/18/2015 04/11/2015  WBC 4.0 - 10.3 10e3/uL 5.0 5.7 6.0  Hemoglobin 13.0 - 17.1 g/dL 12.0(L) 11.9(L) 10.7(L)  Hematocrit 38.4 - 49.9 % 37.1(L) 36.3(L) 33.3(L)  Platelets 140 - 400 10e3/uL 166 164 267    . CMP Latest Ref Rng 04/25/2015 04/11/2015 03/21/2015  Glucose 70 - 140 mg/dl 140 108 107  BUN 7.0 - 26.0 mg/dL 21.5 19.8 24.8  Creatinine 0.7 - 1.3 mg/dL 1.5(H) 1.6(H) 1.5(H)  Sodium 136 - 145 mEq/L 139 141 138  Potassium 3.5 - 5.1 mEq/L 4.7 5.3(H) 4.6  CO2 22 - 29 mEq/L 24 26 24   Calcium 8.4 - 10.4 mg/dL 9.2 9.5 9.4  Total Protein 6.4 - 8.3 g/dL 6.5 6.7 6.7  Total Bilirubin 0.20 - 1.20 mg/dL 0.43 0.49 0.35  Alkaline Phos 40 - 150 U/L 67 61 68  AST 5 - 34 U/L 19 16 21   ALT 0 - 55 U/L 16 13 19     Transthoracic Echocardiography  Patient:  Lawrence Warner, Debenedetti MR #:    [id number] Study Date:  04/25/2015 Gender:   M Age:    82 Height:   171.5 cm Weight:   99 kg BSA:    2.21 m^2 Pt. Status: Room:  SONOGRAPHER Tresa Res, RDCS PERFORMING  [facility], Outpatient ATTENDING  Sullivan Lone [facility]   Sullivan Lone Kishore  cc:  ------------------------------------------------------------------- LV EF: 45% -  50%  ------------------------------------------------------------------- Indications:   CHF - 428.0.  ------------------------------------------------------------------- History:  Risk factors: Hypertension.  ------------------------------------------------------------------- Study Conclusions  - Left ventricle: Abnormal septal motion and inferobasal hypokinesis. The cavity size was mildly dilated. Wall thickness was increased in a pattern of moderate LVH. Systolic function was mildly reduced. The estimated ejection fraction was in the range of 45%  to 50%. - Aortic valve: There was trivial regurgitation. - Mitral valve: Calcified annulus. There was mild regurgitation. - Left atrium: The atrium was moderately dilated.  -------------------------------------------------------------------  ASSESSMENT & PLAN:   82 year old Caucasian male with multiple medical comorbidities with  #1 chronic ITP since 1989. Has previously been responsive to steroids and has received IVIG on one occasion. He has been maintained on 3-81mcg/kg weekly of Nplate since mid [id number] initially with Dr. Arvin Collard at [facility] and then with Dr. Jimmie Molly at [facility] in [place].  No issues with bleeding . Ultrasound abdomen showed normal spleen size SPEP- no obvious monoclonal protein. IFE showed possibility of an restrictive bands in the IgG and Lambda lanes. Patient's platelet counts have remained remarkably stable at Romiplostim doses of 23mcg/kg. Plan  -continue Nplatelet 58mcg/kg qweekly -Continue to monitor labs weekly.  #2  Coronary  artery disease status post CABG in year 2000. Not on aspirin due to his ITP . Increasing dyspnea on exertion .  Wt Readings from Last 3 Encounters:  04/25/15 214 lb 11.2 oz (97.387 kg)  04/11/15 218 lb 4.8 oz (99.02 kg)  03/14/15 215 lb 1.6 oz (97.569 kg)   BNP was elevated. ECHO today shows mildly reduced systolic function and LVH  Patient notes his breathing has improved and his weight is down about 4lbs since last visit. Plan -low salt intake -continue lasix -magnesium replacement to try to help with muscle cramps -Patient was again encouraged to establish and follow up with his primary care physician for ongoing management of his hypertension, coronary disease, diabetes which has been diet controlled and other medical comorbidities.  Has appintment with Dr Sharlet Salina in about 9-10days  #4 chronic kidney disease - creatinine overall stable today @ 1.5. Hyperkalemia resolved. K 4.7. Was eating a lot of bananas Plan -advised to avoid excessive potassium intake Counseled on low salt intake. Needs to establish close follow-up with his primary care physician  -Return to care for follow-up in 4 weeks with cbc, cmp Total time spent 15 minutes more than 50% time on direct patient contact counseling and coordination of care.  Return to care with Dr. Irene Limbo in 4 weeks Continue follow-up for weekly Romiplostim and labs.   Sullivan Lone MD [facility] Physician [facility]  (Office):       [phone] (Work cell):  [phone] (Fax):           [phone]

## 2015-05-02 ENCOUNTER — Ambulatory Visit (HOSPITAL_BASED_OUTPATIENT_CLINIC_OR_DEPARTMENT_OTHER): Payer: Medicare Other

## 2015-05-02 ENCOUNTER — Other Ambulatory Visit (HOSPITAL_BASED_OUTPATIENT_CLINIC_OR_DEPARTMENT_OTHER): Payer: Medicare Other

## 2015-05-02 VITALS — BP 144/49 | HR 52 | Temp 97.7°F

## 2015-05-02 DIAGNOSIS — D693 Immune thrombocytopenic purpura: Secondary | ICD-10-CM | POA: Diagnosis not present

## 2015-05-02 LAB — CBC WITH DIFFERENTIAL/PLATELET
BASO%: 0.1 % (ref 0.0–2.0)
BASOS ABS: 0 10*3/uL (ref 0.0–0.1)
EOS ABS: 0.1 10*3/uL (ref 0.0–0.5)
EOS%: 1.2 % (ref 0.0–7.0)
HEMATOCRIT: 38 % — AB (ref 38.4–49.9)
HGB: 12.6 g/dL — ABNORMAL LOW (ref 13.0–17.1)
LYMPH%: 27.1 % (ref 14.0–49.0)
MCH: 31.8 pg (ref 27.2–33.4)
MCHC: 33.2 g/dL (ref 32.0–36.0)
MCV: 96 fL (ref 79.3–98.0)
MONO#: 0.8 10*3/uL (ref 0.1–0.9)
MONO%: 11.8 % (ref 0.0–14.0)
NEUT#: 4.2 10*3/uL (ref 1.5–6.5)
NEUT%: 59.8 % (ref 39.0–75.0)
PLATELETS: 199 10*3/uL (ref 140–400)
RBC: 3.96 10*6/uL — ABNORMAL LOW (ref 4.20–5.82)
RDW: 12.9 % (ref 11.0–14.6)
WBC: 6.9 10*3/uL (ref 4.0–10.3)
lymph#: 1.9 10*3/uL (ref 0.9–3.3)
nRBC: 0 % (ref 0–0)

## 2015-05-02 MED ORDER — ROMIPLOSTIM 250 MCG ~~LOC~~ SOLR
1.0500 ug/kg | SUBCUTANEOUS | Status: DC
Start: 2015-05-02 — End: 2015-05-02
  Administered 2015-05-02: 100 ug via SUBCUTANEOUS
  Filled 2015-05-02: qty 0.2

## 2015-05-04 ENCOUNTER — Encounter: Payer: Self-pay | Admitting: Internal Medicine

## 2015-05-04 ENCOUNTER — Ambulatory Visit (INDEPENDENT_AMBULATORY_CARE_PROVIDER_SITE_OTHER): Payer: Medicare Other | Admitting: Internal Medicine

## 2015-05-04 VITALS — BP 140/60 | HR 55 | Temp 97.9°F | Resp 20 | Ht 68.0 in | Wt 212.0 lb

## 2015-05-04 DIAGNOSIS — E119 Type 2 diabetes mellitus without complications: Secondary | ICD-10-CM

## 2015-05-04 DIAGNOSIS — L989 Disorder of the skin and subcutaneous tissue, unspecified: Secondary | ICD-10-CM | POA: Diagnosis not present

## 2015-05-04 DIAGNOSIS — Z23 Encounter for immunization: Secondary | ICD-10-CM | POA: Diagnosis not present

## 2015-05-04 DIAGNOSIS — I10 Essential (primary) hypertension: Secondary | ICD-10-CM

## 2015-05-04 DIAGNOSIS — R0602 Shortness of breath: Secondary | ICD-10-CM | POA: Diagnosis not present

## 2015-05-04 DIAGNOSIS — I2510 Atherosclerotic heart disease of native coronary artery without angina pectoris: Secondary | ICD-10-CM

## 2015-05-04 NOTE — Progress Notes (Signed)
Pre visit review using our clinic review tool, if applicable. No additional management support is needed unless otherwise documented below in the visit note. 

## 2015-05-04 NOTE — Patient Instructions (Addendum)
We will send you to the dermatologist.  We will send a note to your blood doctor to get some lab tests for [place] next week.  I would recommend to stop taking the lasix daily and take it only when the swelling gets high on your legs.   I think the breathing problems could be coming from your lungs and if you think you need the inhaler we talked about today just call us and we can get it for you.  Fall Prevention in the Home  Falls can cause injuries and can affect people from all age groups. There are many simple things that you can do to make your home safe and to help prevent falls. WHAT CAN I DO ON THE OUTSIDE OF MY HOME?  Regularly repair the edges of walkways and driveways and fix any cracks.  Remove high doorway thresholds.  Trim any shrubbery on the main path into your home.  Use bright outdoor lighting.  Clear walkways of debris and clutter, including tools and rocks.  Regularly check that handrails are securely fastened and in good repair. Both sides of any steps should have handrails.  Install guardrails along the edges of any raised decks or porches.  Have leaves, snow, and ice cleared regularly.  Use sand or salt on walkways during winter months.  In the garage, clean up any spills right away, including grease or oil spills. WHAT CAN I DO IN THE BATHROOM?  Use night lights.  Install grab bars by the toilet and in the tub and shower. Do not use towel bars as grab bars.  Use non-skid mats or decals on the floor of the tub or shower.  If you need to sit down while you are in the shower, use a plastic, non-slip stool.Marland Kitchen  Keep the floor dry. Immediately clean up any water that spills on the floor.  Remove soap buildup in the tub or shower on a regular basis.  Attach bath mats securely with double-sided non-slip rug tape.  Remove throw rugs and other tripping hazards from the floor. WHAT CAN I DO IN THE BEDROOM?  Use night lights.  Make sure that a bedside light  is easy to reach.  Do not use oversized bedding that drapes onto the floor.  Have a firm chair that has side arms to use for getting dressed.  Remove throw rugs and other tripping hazards from the floor. WHAT CAN I DO IN THE KITCHEN?   Clean up any spills right away.  Avoid walking on wet floors.  Place frequently used items in easy-to-reach places.  If you need to reach for something above you, use a sturdy step stool that has a grab bar.  Keep electrical cables out of the way.  Do not use floor polish or wax that makes floors slippery. If you have to use wax, make sure that it is non-skid floor wax.  Remove throw rugs and other tripping hazards from the floor. WHAT CAN I DO IN THE STAIRWAYS?  Do not leave any items on the stairs.  Make sure that there are handrails on both sides of the stairs. Fix handrails that are broken or loose. Make sure that handrails are as long as the stairways.  Check any carpeting to make sure that it is firmly attached to the stairs. Fix any carpet that is loose or worn.  Avoid having throw rugs at the top or bottom of stairways, or secure the rugs with carpet tape to prevent them from moving.  Make sure that you have a light switch at the top of the stairs and the bottom of the stairs. If you do not have them, have them installed. WHAT ARE SOME OTHER FALL PREVENTION TIPS?  Wear closed-toe shoes that fit well and support your feet. Wear shoes that have rubber soles or low heels.  When you use a stepladder, make sure that it is completely opened and that the sides are firmly locked. Have someone hold the ladder while you are using it. Do not climb a closed stepladder.  Add color or contrast paint or tape to grab bars and handrails in your home. Place contrasting color strips on the first and last steps.  Use mobility aids as needed, such as canes, walkers, scooters, and crutches.  Turn on lights if it is dark. Replace any light bulbs that burn  out.  Set up furniture so that there are clear paths. Keep the furniture in the same spot.  Fix any uneven floor surfaces.  Choose a carpet design that does not hide the edge of steps of a stairway.  Be aware of any and all pets.  Review your medicines with your healthcare provider. Some medicines can cause dizziness or changes in blood pressure, which increase your risk of falling. Talk with your health care provider about other ways that you can decrease your risk of falls. This may include working with a physical therapist or trainer to improve your strength, balance, and endurance.   This information is not intended to replace advice given to you by your health care provider. Make sure you discuss any questions you have with your health care provider.   Document Released: 02/01/2002 Document Revised: 06/28/2014 Document Reviewed: 03/18/2014 Elsevier Interactive Patient Education [employer].

## 2015-05-04 NOTE — Progress Notes (Signed)
   Subjective:    Patient ID: Lawrence Warner, male    DOB: 09-13-1918, 82 y.o.   MRN: [id number]  HPI The patient is a new 82 YO man with no prior PCP. He does have several medical problems which have not been addressed in some time. He has ITP and takes treatment to maintain remission. He does have CAD and diabetes which have not been followed in some time. Denies being on insulin in the past and has never been on treatment for the diabetes. Having some shortness of breath which is not new. He has had it for many years and talked to his hematologist about it last month. They ordered a heart test which he has not heard the results of.   PMH, [facility], social history reviewed and updated.   Review of Systems  Constitutional: Positive for fatigue. Negative for fever, activity change, appetite change and unexpected weight change.  HENT: Negative.   Eyes: Negative.   Respiratory: Positive for shortness of breath. Negative for cough, chest tightness and wheezing.   Cardiovascular: Negative for chest pain, palpitations and leg swelling.  Gastrointestinal: Negative for nausea, abdominal pain, diarrhea, constipation and abdominal distention.  Musculoskeletal: Positive for arthralgias. Negative for back pain and gait problem.  Skin: Negative.   Neurological: Negative.   Psychiatric/Behavioral: Negative.       Objective:   Physical Exam  Constitutional: He is oriented to person, place, and time. He appears well-developed and well-nourished.  HENT:  Head: Normocephalic and atraumatic.  Eyes: EOM are normal.  Neck: Normal range of motion.  Cardiovascular: Normal rate.   Pulmonary/Chest: Effort normal and breath sounds normal. No respiratory distress. He has no wheezes. He has no rales.  Abdominal: Soft. Bowel sounds are normal. He exhibits no distension. There is no tenderness. There is no rebound.  Musculoskeletal: He exhibits no edema.  Neurological: He is alert and oriented to person, place, and  time.  Skin: Skin is warm and dry.  Psychiatric: He has a normal mood and affect.   Filed Vitals:   05/04/15 1001  BP: 140/60  Pulse: 55  Temp: 97.9 F (36.6 C)  TempSrc: Oral  Resp: 20  Height: 5\' 8"  (1.727 m)  Weight: 212 lb (96.163 kg)  SpO2: 95%      Assessment & Plan:  Prevnar 13 and tdap given at visit.

## 2015-05-07 DIAGNOSIS — I10 Essential (primary) hypertension: Secondary | ICD-10-CM | POA: Insufficient documentation

## 2015-05-07 DIAGNOSIS — E119 Type 2 diabetes mellitus without complications: Secondary | ICD-10-CM | POA: Insufficient documentation

## 2015-05-07 DIAGNOSIS — Z951 Presence of aortocoronary bypass graft: Secondary | ICD-10-CM | POA: Insufficient documentation

## 2015-05-07 NOTE — Assessment & Plan Note (Signed)
BP on the low end of normal and will stop his lasix, continue losartan, metoprolol. Recent CMP without indication for change.

## 2015-05-07 NOTE — Assessment & Plan Note (Signed)
Given his former smoking concern that he has some COPD given the stability of his echo recently. Will stop his lasix as this is not helping and causing some cramps.

## 2015-05-07 NOTE — Assessment & Plan Note (Signed)
On ARB, checking HgA1c with next blood draw and adjust as needed. Goal HgA1c <8.

## 2015-05-07 NOTE — Assessment & Plan Note (Signed)
On beta blocker, ARB, statin. Will get records and review.

## 2015-05-09 ENCOUNTER — Ambulatory Visit (HOSPITAL_BASED_OUTPATIENT_CLINIC_OR_DEPARTMENT_OTHER): Payer: Medicare Other

## 2015-05-09 ENCOUNTER — Other Ambulatory Visit (HOSPITAL_BASED_OUTPATIENT_CLINIC_OR_DEPARTMENT_OTHER): Payer: Medicare Other

## 2015-05-09 VITALS — BP 151/63 | HR 52 | Temp 98.0°F

## 2015-05-09 DIAGNOSIS — D693 Immune thrombocytopenic purpura: Secondary | ICD-10-CM

## 2015-05-09 LAB — CBC WITH DIFFERENTIAL/PLATELET
BASO%: 0 % (ref 0.0–2.0)
Basophils Absolute: 0 10*3/uL (ref 0.0–0.1)
EOS ABS: 0.1 10*3/uL (ref 0.0–0.5)
EOS%: 1.2 % (ref 0.0–7.0)
HEMATOCRIT: 36 % — AB (ref 38.4–49.9)
HGB: 11.9 g/dL — ABNORMAL LOW (ref 13.0–17.1)
LYMPH%: 27.3 % (ref 14.0–49.0)
MCH: 31.8 pg (ref 27.2–33.4)
MCHC: 33.1 g/dL (ref 32.0–36.0)
MCV: 96.3 fL (ref 79.3–98.0)
MONO#: 0.5 10*3/uL (ref 0.1–0.9)
MONO%: 9.7 % (ref 0.0–14.0)
NEUT%: 61.8 % (ref 39.0–75.0)
NEUTROS ABS: 3.2 10*3/uL (ref 1.5–6.5)
NRBC: 0 % (ref 0–0)
PLATELETS: 157 10*3/uL (ref 140–400)
RBC: 3.74 10*6/uL — ABNORMAL LOW (ref 4.20–5.82)
RDW: 12.9 % (ref 11.0–14.6)
WBC: 5.2 10*3/uL (ref 4.0–10.3)
lymph#: 1.4 10*3/uL (ref 0.9–3.3)

## 2015-05-09 MED ORDER — CYANOCOBALAMIN 1000 MCG/ML IJ SOLN
1000.0000 ug | INTRAMUSCULAR | Status: DC
  Administered 2015-05-09: 1000 ug via SUBCUTANEOUS

## 2015-05-09 MED ORDER — ROMIPLOSTIM 250 MCG ~~LOC~~ SOLR
1.0000 ug/kg | SUBCUTANEOUS | Status: DC
  Administered 2015-05-09: 95 ug via SUBCUTANEOUS
  Filled 2015-05-09: qty 0.19

## 2015-05-11 ENCOUNTER — Telehealth: Payer: Self-pay | Admitting: Internal Medicine

## 2015-05-11 NOTE — Telephone Encounter (Signed)
Do you want to add labs?

## 2015-05-11 NOTE — Telephone Encounter (Signed)
We have sent a message to Dr. Irene Limbo but we cannot place orders for at the cancer center so he will place them for next draw.

## 2015-05-11 NOTE — Telephone Encounter (Signed)
Pt states he gets labs done every week with oncology. He says Dr. Sharlet Salina was supposed to put more in to be added to them.  Can you please give pt a call regarding this.

## 2015-05-16 ENCOUNTER — Other Ambulatory Visit (HOSPITAL_BASED_OUTPATIENT_CLINIC_OR_DEPARTMENT_OTHER): Payer: Medicare Other

## 2015-05-16 ENCOUNTER — Other Ambulatory Visit: Payer: Self-pay | Admitting: *Deleted

## 2015-05-16 ENCOUNTER — Ambulatory Visit (HOSPITAL_BASED_OUTPATIENT_CLINIC_OR_DEPARTMENT_OTHER): Payer: Medicare Other

## 2015-05-16 VITALS — BP 140/45 | HR 51 | Temp 98.2°F

## 2015-05-16 DIAGNOSIS — D693 Immune thrombocytopenic purpura: Secondary | ICD-10-CM

## 2015-05-16 DIAGNOSIS — I2510 Atherosclerotic heart disease of native coronary artery without angina pectoris: Secondary | ICD-10-CM

## 2015-05-16 LAB — CBC WITH DIFFERENTIAL/PLATELET
BASO%: 0.3 % (ref 0.0–2.0)
BASOS ABS: 0 10*3/uL (ref 0.0–0.1)
EOS ABS: 0.1 10*3/uL (ref 0.0–0.5)
EOS%: 0.7 % (ref 0.0–7.0)
HCT: 34.7 % — ABNORMAL LOW (ref 38.4–49.9)
HEMOGLOBIN: 11.5 g/dL — AB (ref 13.0–17.1)
LYMPH#: 1.4 10*3/uL (ref 0.9–3.3)
LYMPH%: 20.5 % (ref 14.0–49.0)
MCH: 31.8 pg (ref 27.2–33.4)
MCHC: 33.1 g/dL (ref 32.0–36.0)
MCV: 95.9 fL (ref 79.3–98.0)
MONO#: 0.7 10*3/uL (ref 0.1–0.9)
MONO%: 10.3 % (ref 0.0–14.0)
NEUT#: 4.6 10*3/uL (ref 1.5–6.5)
NEUT%: 68.2 % (ref 39.0–75.0)
NRBC: 0 % (ref 0–0)
PLATELETS: 160 10*3/uL (ref 140–400)
RBC: 3.62 10*6/uL — ABNORMAL LOW (ref 4.20–5.82)
RDW: 13 % (ref 11.0–14.6)
WBC: 6.7 10*3/uL (ref 4.0–10.3)

## 2015-05-16 MED ORDER — ROMIPLOSTIM 250 MCG ~~LOC~~ SOLR
1.0000 ug/kg | SUBCUTANEOUS | Status: DC
  Administered 2015-05-16: 95 ug via SUBCUTANEOUS
  Filled 2015-05-16: qty 0.19

## 2015-05-22 ENCOUNTER — Other Ambulatory Visit: Payer: Self-pay | Admitting: *Deleted

## 2015-05-22 DIAGNOSIS — E119 Type 2 diabetes mellitus without complications: Secondary | ICD-10-CM

## 2015-05-23 ENCOUNTER — Ambulatory Visit (HOSPITAL_BASED_OUTPATIENT_CLINIC_OR_DEPARTMENT_OTHER): Payer: Medicare Other

## 2015-05-23 ENCOUNTER — Telehealth: Payer: Self-pay | Admitting: Hematology

## 2015-05-23 ENCOUNTER — Other Ambulatory Visit (HOSPITAL_BASED_OUTPATIENT_CLINIC_OR_DEPARTMENT_OTHER): Payer: Medicare Other

## 2015-05-23 ENCOUNTER — Encounter: Payer: Self-pay | Admitting: Hematology

## 2015-05-23 ENCOUNTER — Ambulatory Visit (HOSPITAL_BASED_OUTPATIENT_CLINIC_OR_DEPARTMENT_OTHER): Payer: Medicare Other | Admitting: Hematology

## 2015-05-23 VITALS — BP 176/50 | HR 50 | Temp 97.5°F | Resp 17 | Ht 68.0 in | Wt 214.0 lb

## 2015-05-23 DIAGNOSIS — D693 Immune thrombocytopenic purpura: Secondary | ICD-10-CM | POA: Diagnosis not present

## 2015-05-23 DIAGNOSIS — I2510 Atherosclerotic heart disease of native coronary artery without angina pectoris: Secondary | ICD-10-CM

## 2015-05-23 DIAGNOSIS — E119 Type 2 diabetes mellitus without complications: Secondary | ICD-10-CM | POA: Diagnosis not present

## 2015-05-23 DIAGNOSIS — N189 Chronic kidney disease, unspecified: Secondary | ICD-10-CM | POA: Diagnosis not present

## 2015-05-23 LAB — CBC WITH DIFFERENTIAL/PLATELET
BASO%: 0.3 % (ref 0.0–2.0)
Basophils Absolute: 0 10*3/uL (ref 0.0–0.1)
EOS ABS: 0.1 10*3/uL (ref 0.0–0.5)
EOS%: 1.3 % (ref 0.0–7.0)
HEMATOCRIT: 36.6 % — AB (ref 38.4–49.9)
HEMOGLOBIN: 11.9 g/dL — AB (ref 13.0–17.1)
LYMPH#: 1.3 10*3/uL (ref 0.9–3.3)
LYMPH%: 24.3 % (ref 14.0–49.0)
MCH: 31.5 pg (ref 27.2–33.4)
MCHC: 32.4 g/dL (ref 32.0–36.0)
MCV: 97.5 fL (ref 79.3–98.0)
MONO#: 0.6 10*3/uL (ref 0.1–0.9)
MONO%: 12.3 % (ref 0.0–14.0)
NEUT%: 61.8 % (ref 39.0–75.0)
NEUTROS ABS: 3.2 10*3/uL (ref 1.5–6.5)
PLATELETS: 196 10*3/uL (ref 140–400)
RBC: 3.76 10*6/uL — AB (ref 4.20–5.82)
RDW: 13.5 % (ref 11.0–14.6)
WBC: 5.3 10*3/uL (ref 4.0–10.3)

## 2015-05-23 LAB — COMPREHENSIVE METABOLIC PANEL
ALBUMIN: 3.5 g/dL (ref 3.5–5.0)
ALK PHOS: 62 U/L (ref 40–150)
ALT: 16 U/L (ref 0–55)
AST: 19 U/L (ref 5–34)
Anion Gap: 8 mEq/L (ref 3–11)
BUN: 22.9 mg/dL (ref 7.0–26.0)
CHLORIDE: 110 meq/L — AB (ref 98–109)
CO2: 25 mEq/L (ref 22–29)
Calcium: 9.3 mg/dL (ref 8.4–10.4)
Creatinine: 1.4 mg/dL — ABNORMAL HIGH (ref 0.7–1.3)
EGFR: 41 mL/min/{1.73_m2} — AB (ref >90)
GLUCOSE: 121 mg/dL (ref 70–140)
POTASSIUM: 5 meq/L (ref 3.5–5.1)
SODIUM: 142 meq/L (ref 136–145)
Total Bilirubin: 0.45 mg/dL (ref 0.20–1.20)
Total Protein: 6.8 g/dL (ref 6.4–8.3)

## 2015-05-23 LAB — LIPID PANEL
Chol/HDL Ratio: 3.4 ratio units (ref 0.0–5.0)
Cholesterol, Total: 100 mg/dL (ref 100–199)
HDL: 29 mg/dL — AB (ref >39)
LDL CALC: 46 mg/dL (ref 0–99)
Triglycerides: 126 mg/dL (ref 0–149)
VLDL CHOLESTEROL CAL: 25 mg/dL (ref 5–40)

## 2015-05-23 MED ORDER — ROMIPLOSTIM 250 MCG ~~LOC~~ SOLR
2.0000 ug/kg | SUBCUTANEOUS | Status: DC
  Administered 2015-05-23: 195 ug via SUBCUTANEOUS
  Filled 2015-05-23: qty 0.39

## 2015-05-23 NOTE — Telephone Encounter (Signed)
per pof to sch pt appt-gave pt copy of avs °

## 2015-05-23 NOTE — Telephone Encounter (Signed)
per pof ot sch pt appt-gave pt copy of avs °

## 2015-05-24 LAB — HEMOGLOBIN A1C
Est. average glucose Bld gHb Est-mCnc: 154 mg/dL
HEMOGLOBIN A1C: 7 % — AB (ref 4.8–5.6)

## 2015-05-24 NOTE — Progress Notes (Signed)
.    Hematology oncology clinic note  Date of service: .05/23/2015   Patient Care Team: Hoyt Koch, MD as PCP - General (Internal Medicine)  CHIEF COMPLAINTS/PURPOSE OF CONSULTATION: Follow-up for ITP   Diagnosis: Idiopathic thrombocytopenic purpura   Current treatment: Nplate weekly to maintain reasonable platelet counts >50K. Requirement have ranged from 67mcg/kg to 55mcg/kg. Recently stable counts with 11mcg/kg  Previous treatment: Steroids, IVIG.  HISTORY OF PRESENTING ILLNESS:  please see my previous clinic note from 09/28/2014 for details of initial presentation and course of treatment.  Interval history  Patient is here for follow-up regarding his ITP. His platelets have remained stable on 15mcg/kg of NPlate. Has not noted any bleeding issues. He notes that he is not taking his Lasix anymore but that his leg swelling has gone down. He notes that he has been walking more and doing breathing exercises and that this has helped his shortness of breath. He recently established a primary care physician for other continued cares. No other acute new symptoms. Appears to be the jovial mood. Has been eating well. He notes his blood sugars have been well-controlled.   MEDICAL HISTORY:  Past Medical History  Diagnosis Date  . BPH (benign prostatic hypertrophy)   . Chronic renal insufficiency   . Coronary artery disease     CABG in 2000  . Diabetes type 2, controlled ([place])     Diet-controlled  . Diabetic neuropathy ([place])   . Hypertension   . Dyslipidemia   . ITP (idiopathic thrombocytopenic purpura)   . Osteoarthritis     SURGICAL HISTORY: Past Surgical History  Procedure Laterality Date  . Cholecystectomy    . Tonsillectomy    . Right hip replacement  2014  . Coronary artery bypass graft  2000    SOCIAL HISTORY: Social History   Social History  . Marital Status: Single    Spouse Name: N/A  . Number of Children: N/A  . Years of Education: N/A   Occupational  History  . Not on file.   Social History Main Topics  . Smoking status: Former Smoker -- 2.00 packs/day for 25 years  . Smokeless tobacco: Never Used  . Alcohol Use: No  . Drug Use: No  . Sexual Activity: Not Currently   Other Topics Concern  . Not on file   Social History Narrative   He is living in [place] which is an assisted living facility. One of his son is also moving to [facility] and has a Designer, jewellery in religious studies. Patient notes that his other son is a Systems analyst with multiple awards.  FAMILY HISTORY: Family History  Problem Relation Age of Onset  . Family history unknown: Yes    ALLERGIES:  is allergic to penicillins.  MEDICATIONS:  Current Outpatient Prescriptions  Medication Sig Dispense Refill  . acetaminophen (TYLENOL) 500 MG tablet Take 250 mg by mouth 2 (two) times daily.     . Ascorbic Acid (VITAMIN C) 100 MG tablet Take 100 mg by mouth daily.    . Cholecalciferol (VITAMIN D-1000 MAX ST) 1000 UNITS tablet Take 1 tablet (1,000 Units total) by mouth daily.    . furosemide (LASIX) 40 MG tablet Take 1 tablet (40 mg total) by mouth daily. 30 tablet 0  . losartan (COZAAR) 100 MG tablet Take 100 mg by mouth daily. Reported on 05/04/2015    . magnesium chloride (SLOW-MAG) 64 MG TBEC SR tablet Take 1 tablet (64 mg total) by mouth daily. 60 tablet 1  .  metoprolol tartrate (LOPRESSOR) 25 MG tablet Take 6.25 mg by mouth 2 (two) times daily.     Marland Kitchen omeprazole (PRILOSEC) 20 MG capsule Take 20 mg by mouth daily.    . simvastatin (ZOCOR) 20 MG tablet Take 20 mg by mouth daily.     No current facility-administered medications for this visit.   Romiplostim 5 mcg/kg weekly   REVIEW OF SYSTEMS:   10 point review of system is negative except as noted above  PHYSICAL EXAMINATION: ECOG PERFORMANCE STATUS: 1 - Symptomatic but completely ambulatory  Filed Vitals:   05/23/15 0932  BP: 176/50  Pulse: 50  Temp: 97.5 F (36.4 C)  Resp: 17   Filed  Weights   05/23/15 0932  Weight: 214 lb (97.07 kg)   GENERAL: Elderly gentleman in no acute distress:alert and comfortable SKIN: skin color, texture, turgor are normal, no rashes or significant lesions EYES: normal, conjunctiva are pink and non-injected, sclera clear OROPHARYNX:no exudate, no erythema and lips, buccal mucosa, and tongue normal  NECK: supple, thyroid normal size, non-tender, without nodularity LYMPH:  no palpable lymphadenopathy in the cervical, axillary or inguinal LUNGS: Air entry bilaterally equal, No rales no rhonchi HEART: regular rate & rhythm and 2 x 6 systolic murmur over aortic area, and no lower extremity edema ABDOMEN:abdomen soft, non-tender and normal bowel sounds PSYCH: alert & oriented x 3 with fluent speech NEURO: no focal motor/sensory deficits  LABORATORY DATA:   . CBC Latest Ref Rng 05/23/2015 05/16/2015 05/09/2015  WBC 4.0 - 10.3 10e3/uL 5.3 6.7 5.2  Hemoglobin 13.0 - 17.1 g/dL 11.9(L) 11.5(L) 11.9(L)  Hematocrit 38.4 - 49.9 % 36.6(L) 34.7(L) 36.0(L)  Platelets 140 - 400 10e3/uL 196 160 157    . CMP Latest Ref Rng 05/23/2015 04/25/2015 04/11/2015  Glucose 70 - 140 mg/dl 121 140 108  BUN 7.0 - 26.0 mg/dL 22.9 21.5 19.8  Creatinine 0.7 - 1.3 mg/dL 1.4(H) 1.5(H) 1.6(H)  Sodium 136 - 145 mEq/L 142 139 141  Potassium 3.5 - 5.1 mEq/L 5.0 4.7 5.3(H)  CO2 22 - 29 mEq/L 25 24 26   Calcium 8.4 - 10.4 mg/dL 9.3 9.2 9.5  Total Protein 6.4 - 8.3 g/dL 6.8 6.5 6.7  Total Bilirubin 0.20 - 1.20 mg/dL 0.45 0.43 0.49  Alkaline Phos 40 - 150 U/L 62 67 61  AST 5 - 34 U/L 19 19 16   ALT 0 - 55 U/L 16 16 13     Transthoracic Echocardiography  Patient:  Yehoshua, Napora MR #:    [id number] Study Date: 04/25/2015 Gender:   M Age:    82 Height:   171.5 cm Weight:   99 kg BSA:    2.21 m^2 Pt. Status: Room:  SONOGRAPHER Tresa Res, RDCS PERFORMING  [facility], Outpatient ATTENDING  Sullivan Lone [facility]   Sullivan Lone  Kishore  cc:  ------------------------------------------------------------------- LV EF: 45% -  50%  ------------------------------------------------------------------- Indications:   CHF - 428.0.  ------------------------------------------------------------------- History:  Risk factors: Hypertension.  ------------------------------------------------------------------- Study Conclusions  - Left ventricle: Abnormal septal motion and inferobasal hypokinesis. The cavity size was mildly dilated. Wall thickness was increased in a pattern of moderate LVH. Systolic function was mildly reduced. The estimated ejection fraction was in the range of 45% to 50%. - Aortic valve: There was trivial regurgitation. - Mitral valve: Calcified annulus. There was mild regurgitation. - Left atrium: The atrium was moderately dilated.  -------------------------------------------------------------------  ASSESSMENT & PLAN:   82 year old Caucasian male with multiple medical comorbidities with  #1 chronic ITP since 1989. Has  previously been responsive to steroids and has received IVIG on one occasion. He has been maintained on 3-26mcg/kg weekly of Nplate since mid 89FGE initially with Dr. Arvin Collard at [facility] and then with Dr. Jimmie Molly at [facility] in [place].  No issues with bleeding . Ultrasound abdomen showed normal spleen size SPEP- no obvious monoclonal protein. IFE showed possibility of an restrictive bands in the IgG and Lambda lanes. Patient's platelet counts have remained remarkably stable at Romiplostim doses of 76mcg/kg. Plan  -continue Nplatelet 74mcg/kg qweekly and adjust per treatment parameters. -Continue to monitor labs weekly.  #2  Coronary artery disease status post CABG in year 2000. Not on aspirin due to his ITP . BNP was elevated. ECHO today shows mildly reduced systolic function and LVH  Patient notes his breathing has improved and his leg swelling is  down. Plan -low salt intake -magnesium replacement to try to help with muscle cramps -notes that his muscle cramps have improved. -Patient has established follow up with his primary care physician for ongoing management of his hypertension, coronary disease, diabetes which has been diet controlled and other medical comorbidities. We'll defer further management for these to Dr. Sharlet Salina.  #4 chronic kidney disease - creatinine overall stable today @ 1.4. Potassium today 5 Plan -advised to avoid excessive potassium intake Counseled on low salt intake. Needs to establish close follow-up with his primary care physician  -Return to clinic with Dr Irene Limbo in 2 months Continue weekly Nplate  Total time spent 15 minutes more than 50% time on direct patient contact counseling and coordination of care.    Sullivan Lone MD [place] Hematology/Oncology Physician [facility]  (Office):       [phone] (Work cell):  [phone] (Fax):           [phone]

## 2015-05-30 ENCOUNTER — Ambulatory Visit (HOSPITAL_BASED_OUTPATIENT_CLINIC_OR_DEPARTMENT_OTHER): Payer: Medicare Other

## 2015-05-30 ENCOUNTER — Other Ambulatory Visit (HOSPITAL_BASED_OUTPATIENT_CLINIC_OR_DEPARTMENT_OTHER): Payer: Medicare Other

## 2015-05-30 VITALS — BP 157/51 | HR 53 | Temp 98.1°F

## 2015-05-30 DIAGNOSIS — D693 Immune thrombocytopenic purpura: Secondary | ICD-10-CM

## 2015-05-30 LAB — CBC WITH DIFFERENTIAL/PLATELET
BASO%: 0.3 % (ref 0.0–2.0)
Basophils Absolute: 0 10*3/uL (ref 0.0–0.1)
EOS ABS: 0.1 10*3/uL (ref 0.0–0.5)
EOS%: 1.1 % (ref 0.0–7.0)
HEMATOCRIT: 35.1 % — AB (ref 38.4–49.9)
HEMOGLOBIN: 11.5 g/dL — AB (ref 13.0–17.1)
LYMPH%: 22.7 % (ref 14.0–49.0)
MCH: 31.9 pg (ref 27.2–33.4)
MCHC: 32.8 g/dL (ref 32.0–36.0)
MCV: 97.2 fL (ref 79.3–98.0)
MONO#: 0.7 10*3/uL (ref 0.1–0.9)
MONO%: 10 % (ref 0.0–14.0)
NEUT%: 65.9 % (ref 39.0–75.0)
NEUTROS ABS: 4.3 10*3/uL (ref 1.5–6.5)
Platelets: 254 10*3/uL (ref 140–400)
RBC: 3.61 10*6/uL — ABNORMAL LOW (ref 4.20–5.82)
RDW: 13.4 % (ref 11.0–14.6)
WBC: 6.5 10*3/uL (ref 4.0–10.3)
lymph#: 1.5 10*3/uL (ref 0.9–3.3)
nRBC: 0 % (ref 0–0)

## 2015-05-30 MED ORDER — ROMIPLOSTIM 250 MCG ~~LOC~~ SOLR
1.0500 ug/kg | SUBCUTANEOUS | Status: DC
  Administered 2015-05-30: 100 ug via SUBCUTANEOUS
  Filled 2015-05-30: qty 0.2

## 2015-06-01 DIAGNOSIS — L219 Seborrheic dermatitis, unspecified: Secondary | ICD-10-CM | POA: Diagnosis not present

## 2015-06-01 DIAGNOSIS — L728 Other follicular cysts of the skin and subcutaneous tissue: Secondary | ICD-10-CM | POA: Diagnosis not present

## 2015-06-01 DIAGNOSIS — L570 Actinic keratosis: Secondary | ICD-10-CM | POA: Diagnosis not present

## 2015-06-06 ENCOUNTER — Other Ambulatory Visit (HOSPITAL_BASED_OUTPATIENT_CLINIC_OR_DEPARTMENT_OTHER): Payer: Medicare Other

## 2015-06-06 ENCOUNTER — Ambulatory Visit (HOSPITAL_BASED_OUTPATIENT_CLINIC_OR_DEPARTMENT_OTHER): Payer: Medicare Other

## 2015-06-06 VITALS — BP 177/56 | HR 64 | Temp 98.4°F

## 2015-06-06 DIAGNOSIS — N189 Chronic kidney disease, unspecified: Secondary | ICD-10-CM

## 2015-06-06 DIAGNOSIS — D693 Immune thrombocytopenic purpura: Secondary | ICD-10-CM

## 2015-06-06 LAB — CBC WITH DIFFERENTIAL/PLATELET
BASO%: 0.2 % (ref 0.0–2.0)
BASOS ABS: 0 10*3/uL (ref 0.0–0.1)
EOS ABS: 0.1 10*3/uL (ref 0.0–0.5)
EOS%: 1.2 % (ref 0.0–7.0)
HCT: 35.9 % — ABNORMAL LOW (ref 38.4–49.9)
HGB: 11.8 g/dL — ABNORMAL LOW (ref 13.0–17.1)
LYMPH%: 22.6 % (ref 14.0–49.0)
MCH: 32.1 pg (ref 27.2–33.4)
MCHC: 32.9 g/dL (ref 32.0–36.0)
MCV: 97.6 fL (ref 79.3–98.0)
MONO#: 0.6 10*3/uL (ref 0.1–0.9)
MONO%: 10.2 % (ref 0.0–14.0)
NEUT#: 3.7 10*3/uL (ref 1.5–6.5)
NEUT%: 65.8 % (ref 39.0–75.0)
PLATELETS: 322 10*3/uL (ref 140–400)
RBC: 3.68 10*6/uL — AB (ref 4.20–5.82)
RDW: 13.3 % (ref 11.0–14.6)
WBC: 5.7 10*3/uL (ref 4.0–10.3)
lymph#: 1.3 10*3/uL (ref 0.9–3.3)
nRBC: 0 % (ref 0–0)

## 2015-06-06 MED ORDER — CYANOCOBALAMIN 1000 MCG/ML IJ SOLN
1000.0000 ug | INTRAMUSCULAR | Status: DC
  Administered 2015-06-06: 1000 ug via SUBCUTANEOUS

## 2015-06-06 MED ORDER — ROMIPLOSTIM 250 MCG ~~LOC~~ SOLR
100.0000 ug | SUBCUTANEOUS | Status: DC
  Administered 2015-06-06: 100 ug via SUBCUTANEOUS
  Filled 2015-06-06: qty 0.2

## 2015-06-13 ENCOUNTER — Ambulatory Visit (HOSPITAL_BASED_OUTPATIENT_CLINIC_OR_DEPARTMENT_OTHER): Payer: Medicare Other

## 2015-06-13 ENCOUNTER — Other Ambulatory Visit (HOSPITAL_BASED_OUTPATIENT_CLINIC_OR_DEPARTMENT_OTHER): Payer: Medicare Other

## 2015-06-13 VITALS — BP 167/61 | HR 52 | Temp 98.3°F

## 2015-06-13 DIAGNOSIS — D693 Immune thrombocytopenic purpura: Secondary | ICD-10-CM | POA: Diagnosis not present

## 2015-06-13 LAB — CBC WITH DIFFERENTIAL/PLATELET
BASO%: 0.3 % (ref 0.0–2.0)
BASOS ABS: 0 10*3/uL (ref 0.0–0.1)
EOS ABS: 0.1 10*3/uL (ref 0.0–0.5)
EOS%: 0.8 % (ref 0.0–7.0)
HEMATOCRIT: 37.1 % — AB (ref 38.4–49.9)
HEMOGLOBIN: 12.3 g/dL — AB (ref 13.0–17.1)
LYMPH#: 1.7 10*3/uL (ref 0.9–3.3)
LYMPH%: 27.3 % (ref 14.0–49.0)
MCH: 32.2 pg (ref 27.2–33.4)
MCHC: 33.2 g/dL (ref 32.0–36.0)
MCV: 97.1 fL (ref 79.3–98.0)
MONO#: 0.7 10*3/uL (ref 0.1–0.9)
MONO%: 11.5 % (ref 0.0–14.0)
NEUT%: 60.1 % (ref 39.0–75.0)
NEUTROS ABS: 3.6 10*3/uL (ref 1.5–6.5)
NRBC: 0 % (ref 0–0)
PLATELETS: 266 10*3/uL (ref 140–400)
RBC: 3.82 10*6/uL — ABNORMAL LOW (ref 4.20–5.82)
RDW: 13.2 % (ref 11.0–14.6)
WBC: 6.1 10*3/uL (ref 4.0–10.3)

## 2015-06-13 MED ORDER — ROMIPLOSTIM 250 MCG ~~LOC~~ SOLR
100.0000 ug | SUBCUTANEOUS | Status: DC
Start: 2015-06-13 — End: 2015-06-13
  Administered 2015-06-13: 100 ug via SUBCUTANEOUS
  Filled 2015-06-13: qty 0.2

## 2015-06-14 ENCOUNTER — Encounter: Payer: Self-pay | Admitting: *Deleted

## 2015-06-14 NOTE — Progress Notes (Signed)
[place] Work  Clinical Social Work was referred by nurse due to handicap placard form showing up in [place] office. Clinical Social Worker contacted patient at home to offer support and assess for needs. Form filled out, but needs to get to patient. CSW spoke with pt and is mailing the form to his home address. Pt denied other needs currently.    Clinical Social Work interventions:  Resource assistance  Loren Racer, [place] Worker [place]  [place] Phone: [phone] Fax: [phone]

## 2015-06-20 ENCOUNTER — Other Ambulatory Visit (HOSPITAL_BASED_OUTPATIENT_CLINIC_OR_DEPARTMENT_OTHER): Payer: Medicare Other

## 2015-06-20 ENCOUNTER — Ambulatory Visit (HOSPITAL_BASED_OUTPATIENT_CLINIC_OR_DEPARTMENT_OTHER): Payer: Medicare Other

## 2015-06-20 VITALS — BP 168/60 | Temp 98.1°F

## 2015-06-20 DIAGNOSIS — D693 Immune thrombocytopenic purpura: Secondary | ICD-10-CM

## 2015-06-20 LAB — CBC WITH DIFFERENTIAL/PLATELET
BASO%: 0.2 % (ref 0.0–2.0)
Basophils Absolute: 0 10*3/uL (ref 0.0–0.1)
EOS ABS: 0 10*3/uL (ref 0.0–0.5)
EOS%: 0.7 % (ref 0.0–7.0)
HCT: 38.2 % — ABNORMAL LOW (ref 38.4–49.9)
HEMOGLOBIN: 12.5 g/dL — AB (ref 13.0–17.1)
LYMPH#: 1.1 10*3/uL (ref 0.9–3.3)
LYMPH%: 18 % (ref 14.0–49.0)
MCH: 32 pg (ref 27.2–33.4)
MCHC: 32.7 g/dL (ref 32.0–36.0)
MCV: 97.9 fL (ref 79.3–98.0)
MONO#: 0.7 10*3/uL (ref 0.1–0.9)
MONO%: 10.9 % (ref 0.0–14.0)
NEUT%: 70.2 % (ref 39.0–75.0)
NEUTROS ABS: 4.5 10*3/uL (ref 1.5–6.5)
PLATELETS: 165 10*3/uL (ref 140–400)
RBC: 3.9 10*6/uL — ABNORMAL LOW (ref 4.20–5.82)
RDW: 13.4 % (ref 11.0–14.6)
WBC: 6.4 10*3/uL (ref 4.0–10.3)

## 2015-06-20 MED ORDER — ROMIPLOSTIM 250 MCG ~~LOC~~ SOLR
100.0000 ug | SUBCUTANEOUS | Status: DC
  Administered 2015-06-20: 100 ug via SUBCUTANEOUS
  Filled 2015-06-20: qty 0.2

## 2015-06-27 ENCOUNTER — Other Ambulatory Visit (HOSPITAL_BASED_OUTPATIENT_CLINIC_OR_DEPARTMENT_OTHER): Payer: Medicare Other

## 2015-06-27 ENCOUNTER — Ambulatory Visit (HOSPITAL_BASED_OUTPATIENT_CLINIC_OR_DEPARTMENT_OTHER): Payer: Medicare Other

## 2015-06-27 VITALS — BP 158/47 | HR 55 | Temp 97.7°F

## 2015-06-27 DIAGNOSIS — D693 Immune thrombocytopenic purpura: Secondary | ICD-10-CM

## 2015-06-27 LAB — CBC WITH DIFFERENTIAL/PLATELET
BASO%: 0.2 % (ref 0.0–2.0)
BASOS ABS: 0 10*3/uL (ref 0.0–0.1)
EOS%: 1 % (ref 0.0–7.0)
Eosinophils Absolute: 0.1 10*3/uL (ref 0.0–0.5)
HCT: 36 % — ABNORMAL LOW (ref 38.4–49.9)
HEMOGLOBIN: 11.9 g/dL — AB (ref 13.0–17.1)
LYMPH#: 1.3 10*3/uL (ref 0.9–3.3)
LYMPH%: 21.2 % (ref 14.0–49.0)
MCH: 32.2 pg (ref 27.2–33.4)
MCHC: 33.1 g/dL (ref 32.0–36.0)
MCV: 97.6 fL (ref 79.3–98.0)
MONO#: 0.7 10*3/uL (ref 0.1–0.9)
MONO%: 11.7 % (ref 0.0–14.0)
NEUT#: 4.2 10*3/uL (ref 1.5–6.5)
NEUT%: 65.9 % (ref 39.0–75.0)
Platelets: 225 10*3/uL (ref 140–400)
RBC: 3.69 10*6/uL — AB (ref 4.20–5.82)
RDW: 13.2 % (ref 11.0–14.6)
WBC: 6.3 10*3/uL (ref 4.0–10.3)
nRBC: 0 % (ref 0–0)

## 2015-06-27 MED ORDER — ROMIPLOSTIM 250 MCG ~~LOC~~ SOLR
100.0000 ug | SUBCUTANEOUS | Status: DC
  Administered 2015-06-27: 100 ug via SUBCUTANEOUS
  Filled 2015-06-27: qty 0.2

## 2015-07-04 ENCOUNTER — Telehealth: Payer: Self-pay | Admitting: Pharmacist

## 2015-07-04 ENCOUNTER — Ambulatory Visit (HOSPITAL_BASED_OUTPATIENT_CLINIC_OR_DEPARTMENT_OTHER): Payer: Medicare Other

## 2015-07-04 ENCOUNTER — Other Ambulatory Visit (HOSPITAL_BASED_OUTPATIENT_CLINIC_OR_DEPARTMENT_OTHER): Payer: Medicare Other

## 2015-07-04 VITALS — BP 162/51 | HR 53 | Temp 98.0°F | Resp 16

## 2015-07-04 DIAGNOSIS — D693 Immune thrombocytopenic purpura: Secondary | ICD-10-CM | POA: Diagnosis not present

## 2015-07-04 LAB — CBC WITH DIFFERENTIAL/PLATELET
BASO%: 0.4 % (ref 0.0–2.0)
Basophils Absolute: 0 10*3/uL (ref 0.0–0.1)
EOS ABS: 0.1 10*3/uL (ref 0.0–0.5)
EOS%: 1.9 % (ref 0.0–7.0)
HEMATOCRIT: 37.6 % — AB (ref 38.4–49.9)
HGB: 12.4 g/dL — ABNORMAL LOW (ref 13.0–17.1)
LYMPH%: 25.4 % (ref 14.0–49.0)
MCH: 32 pg (ref 27.2–33.4)
MCHC: 33 g/dL (ref 32.0–36.0)
MCV: 96.9 fL (ref 79.3–98.0)
MONO#: 0.5 10*3/uL (ref 0.1–0.9)
MONO%: 9.2 % (ref 0.0–14.0)
NEUT#: 3.6 10*3/uL (ref 1.5–6.5)
NEUT%: 63.1 % (ref 39.0–75.0)
PLATELETS: 260 10*3/uL (ref 140–400)
RBC: 3.88 10*6/uL — AB (ref 4.20–5.82)
RDW: 13 % (ref 11.0–14.6)
WBC: 5.7 10*3/uL (ref 4.0–10.3)
lymph#: 1.4 10*3/uL (ref 0.9–3.3)
nRBC: 0 % (ref 0–0)

## 2015-07-04 MED ORDER — CYANOCOBALAMIN 1000 MCG/ML IJ SOLN
1000.0000 ug | INTRAMUSCULAR | Status: DC
  Administered 2015-07-04: 1000 ug via SUBCUTANEOUS

## 2015-07-04 MED ORDER — ROMIPLOSTIM 250 MCG ~~LOC~~ SOLR
50.0000 ug | SUBCUTANEOUS | Status: DC
  Administered 2015-07-04: 50 ug via SUBCUTANEOUS
  Filled 2015-07-04: qty 0.1

## 2015-07-04 NOTE — Telephone Encounter (Signed)
Pt is sensitive to small doses of NPlate. Pltc > 200 x 2 weeks, but patient only at 1 mcg/kg. Will dose reduce by 50% to 0.5 mcg/kg. Spoke with Dr. Irene Limbo, he states agreement with this plan.

## 2015-07-11 ENCOUNTER — Other Ambulatory Visit (HOSPITAL_BASED_OUTPATIENT_CLINIC_OR_DEPARTMENT_OTHER): Payer: Medicare Other

## 2015-07-11 ENCOUNTER — Ambulatory Visit (HOSPITAL_BASED_OUTPATIENT_CLINIC_OR_DEPARTMENT_OTHER): Payer: Medicare Other

## 2015-07-11 VITALS — BP 167/57 | HR 55 | Temp 97.5°F | Resp 22

## 2015-07-11 DIAGNOSIS — D693 Immune thrombocytopenic purpura: Secondary | ICD-10-CM | POA: Diagnosis not present

## 2015-07-11 LAB — CBC WITH DIFFERENTIAL/PLATELET
BASO%: 0.4 % (ref 0.0–2.0)
Basophils Absolute: 0 10*3/uL (ref 0.0–0.1)
EOS%: 0.7 % (ref 0.0–7.0)
Eosinophils Absolute: 0 10*3/uL (ref 0.0–0.5)
HEMATOCRIT: 37.5 % — AB (ref 38.4–49.9)
HEMOGLOBIN: 12.4 g/dL — AB (ref 13.0–17.1)
LYMPH#: 1.4 10*3/uL (ref 0.9–3.3)
LYMPH%: 25.4 % (ref 14.0–49.0)
MCH: 31.9 pg (ref 27.2–33.4)
MCHC: 33.1 g/dL (ref 32.0–36.0)
MCV: 96.4 fL (ref 79.3–98.0)
MONO#: 0.7 10*3/uL (ref 0.1–0.9)
MONO%: 12.8 % (ref 0.0–14.0)
NEUT%: 60.7 % (ref 39.0–75.0)
NEUTROS ABS: 3.3 10*3/uL (ref 1.5–6.5)
Platelets: 183 10*3/uL (ref 140–400)
RBC: 3.89 10*6/uL — ABNORMAL LOW (ref 4.20–5.82)
RDW: 13 % (ref 11.0–14.6)
WBC: 5.4 10*3/uL (ref 4.0–10.3)
nRBC: 0 % (ref 0–0)

## 2015-07-11 MED ORDER — ROMIPLOSTIM 250 MCG ~~LOC~~ SOLR
50.0000 ug | SUBCUTANEOUS | Status: DC
  Administered 2015-07-11: 50 ug via SUBCUTANEOUS
  Filled 2015-07-11: qty 0.1

## 2015-07-11 NOTE — Patient Instructions (Signed)
Romiplostim injection What is this medicine? ROMIPLOSTIM (roe mi PLOE stim) helps your body make more platelets. This medicine is used to treat low platelets caused by chronic idiopathic thrombocytopenic purpura (ITP). This medicine may be used for other purposes; ask your health care provider or pharmacist if you have questions. What should I tell my health care provider before I take this medicine? They need to know if you have any of these conditions: -cancer or myelodysplastic syndrome -low blood counts, like low white cell, platelet, or red cell counts -take medicines that treat or prevent blood clots -an unusual or allergic reaction to romiplostim, mannitol, other medicines, foods, dyes, or preservatives -pregnant or trying to get pregnant -breast-feeding How should I use this medicine? This medicine is for injection under the skin. It is given by a health care professional in a hospital or clinic setting. A special MedGuide will be given to you before your injection. Read this information carefully each time. Talk to your pediatrician regarding the use of this medicine in children. Special care may be needed. Overdosage: If you think you have taken too much of this medicine contact a poison control center or emergency room at once. NOTE: This medicine is only for you. Do not share this medicine with others. What if I miss a dose? It is important not to miss your dose. Call your doctor or health care professional if you are unable to keep an appointment. What may interact with this medicine? Interactions are not expected. This list may not describe all possible interactions. Give your health care provider a list of all the medicines, herbs, non-prescription drugs, or dietary supplements you use. Also tell them if you smoke, drink alcohol, or use illegal drugs. Some items may interact with your medicine. What should I watch for while using this medicine? Your condition will be monitored  carefully while you are receiving this medicine. Visit your prescriber or health care professional for regular checks on your progress and for the needed blood tests. It is important to keep all appointments. What side effects may I notice from receiving this medicine? Side effects that you should report to your doctor or health care professional as soon as possible: -allergic reactions like skin rash, itching or hives, swelling of the face, lips, or tongue -shortness of breath, chest pain, swelling in a leg -unusual bleeding or bruising Side effects that usually do not require medical attention (report to your doctor or health care professional if they continue or are bothersome): -dizziness -headache -muscle aches -pain in arms and legs -stomach pain -trouble sleeping This list may not describe all possible side effects. Call your doctor for medical advice about side effects. You may report side effects to FDA at 1-800-FDA-1088. Where should I keep my medicine? This drug is given in a hospital or clinic and will not be stored at home. NOTE: This sheet is a summary. It may not cover all possible information. If you have questions about this medicine, talk to your doctor, pharmacist, or health care provider.    2016, Elsevier/Gold Standard. (2007-10-12 15:13:04)  

## 2015-07-18 ENCOUNTER — Other Ambulatory Visit (HOSPITAL_BASED_OUTPATIENT_CLINIC_OR_DEPARTMENT_OTHER): Payer: Medicare Other

## 2015-07-18 ENCOUNTER — Ambulatory Visit (HOSPITAL_BASED_OUTPATIENT_CLINIC_OR_DEPARTMENT_OTHER): Payer: Medicare Other

## 2015-07-18 VITALS — BP 157/52 | HR 56 | Temp 97.9°F | Resp 22

## 2015-07-18 DIAGNOSIS — D693 Immune thrombocytopenic purpura: Secondary | ICD-10-CM

## 2015-07-18 LAB — CBC WITH DIFFERENTIAL/PLATELET
BASO%: 0.2 % (ref 0.0–2.0)
BASOS ABS: 0 10*3/uL (ref 0.0–0.1)
EOS ABS: 0.1 10*3/uL (ref 0.0–0.5)
EOS%: 0.9 % (ref 0.0–7.0)
HCT: 36.5 % — ABNORMAL LOW (ref 38.4–49.9)
HGB: 12.2 g/dL — ABNORMAL LOW (ref 13.0–17.1)
LYMPH#: 1.7 10*3/uL (ref 0.9–3.3)
LYMPH%: 29.8 % (ref 14.0–49.0)
MCH: 32.3 pg (ref 27.2–33.4)
MCHC: 33.4 g/dL (ref 32.0–36.0)
MCV: 96.6 fL (ref 79.3–98.0)
MONO#: 0.6 10*3/uL (ref 0.1–0.9)
MONO%: 10.5 % (ref 0.0–14.0)
NEUT#: 3.3 10*3/uL (ref 1.5–6.5)
NEUT%: 58.6 % (ref 39.0–75.0)
PLATELETS: 115 10*3/uL — AB (ref 140–400)
RBC: 3.78 10*6/uL — AB (ref 4.20–5.82)
RDW: 12.9 % (ref 11.0–14.6)
WBC: 5.6 10*3/uL (ref 4.0–10.3)
nRBC: 0 % (ref 0–0)

## 2015-07-18 MED ORDER — ROMIPLOSTIM 250 MCG ~~LOC~~ SOLR
50.0000 ug | SUBCUTANEOUS | Status: DC
  Administered 2015-07-18: 50 ug via SUBCUTANEOUS
  Filled 2015-07-18: qty 0.1

## 2015-07-18 NOTE — Patient Instructions (Signed)
Romiplostim injection What is this medicine? ROMIPLOSTIM (roe mi PLOE stim) helps your body make more platelets. This medicine is used to treat low platelets caused by chronic idiopathic thrombocytopenic purpura (ITP). This medicine may be used for other purposes; ask your health care provider or pharmacist if you have questions. What should I tell my health care provider before I take this medicine? They need to know if you have any of these conditions: -cancer or myelodysplastic syndrome -low blood counts, like low white cell, platelet, or red cell counts -take medicines that treat or prevent blood clots -an unusual or allergic reaction to romiplostim, mannitol, other medicines, foods, dyes, or preservatives -pregnant or trying to get pregnant -breast-feeding How should I use this medicine? This medicine is for injection under the skin. It is given by a health care professional in a hospital or clinic setting. A special MedGuide will be given to you before your injection. Read this information carefully each time. Talk to your pediatrician regarding the use of this medicine in children. Special care may be needed. Overdosage: If you think you have taken too much of this medicine contact a poison control center or emergency room at once. NOTE: This medicine is only for you. Do not share this medicine with others. What if I miss a dose? It is important not to miss your dose. Call your doctor or health care professional if you are unable to keep an appointment. What may interact with this medicine? Interactions are not expected. This list may not describe all possible interactions. Give your health care provider a list of all the medicines, herbs, non-prescription drugs, or dietary supplements you use. Also tell them if you smoke, drink alcohol, or use illegal drugs. Some items may interact with your medicine. What should I watch for while using this medicine? Your condition will be monitored  carefully while you are receiving this medicine. Visit your prescriber or health care professional for regular checks on your progress and for the needed blood tests. It is important to keep all appointments. What side effects may I notice from receiving this medicine? Side effects that you should report to your doctor or health care professional as soon as possible: -allergic reactions like skin rash, itching or hives, swelling of the face, lips, or tongue -shortness of breath, chest pain, swelling in a leg -unusual bleeding or bruising Side effects that usually do not require medical attention (report to your doctor or health care professional if they continue or are bothersome): -dizziness -headache -muscle aches -pain in arms and legs -stomach pain -trouble sleeping This list may not describe all possible side effects. Call your doctor for medical advice about side effects. You may report side effects to FDA at 1-800-FDA-1088. Where should I keep my medicine? This drug is given in a hospital or clinic and will not be stored at home. NOTE: This sheet is a summary. It may not cover all possible information. If you have questions about this medicine, talk to your doctor, pharmacist, or health care provider.    2016, Elsevier/Gold Standard. (2007-10-12 15:13:04)  

## 2015-07-25 ENCOUNTER — Other Ambulatory Visit: Payer: Self-pay | Admitting: *Deleted

## 2015-07-25 ENCOUNTER — Encounter: Payer: Self-pay | Admitting: Hematology

## 2015-07-25 ENCOUNTER — Ambulatory Visit (HOSPITAL_BASED_OUTPATIENT_CLINIC_OR_DEPARTMENT_OTHER): Payer: Medicare Other | Admitting: Hematology

## 2015-07-25 ENCOUNTER — Ambulatory Visit (HOSPITAL_BASED_OUTPATIENT_CLINIC_OR_DEPARTMENT_OTHER): Payer: Medicare Other

## 2015-07-25 ENCOUNTER — Telehealth: Payer: Self-pay | Admitting: Hematology

## 2015-07-25 ENCOUNTER — Other Ambulatory Visit (HOSPITAL_BASED_OUTPATIENT_CLINIC_OR_DEPARTMENT_OTHER): Payer: Medicare Other

## 2015-07-25 VITALS — BP 167/53 | HR 63 | Temp 97.5°F | Resp 21 | Ht 68.0 in | Wt 219.9 lb

## 2015-07-25 DIAGNOSIS — N189 Chronic kidney disease, unspecified: Secondary | ICD-10-CM | POA: Diagnosis not present

## 2015-07-25 DIAGNOSIS — D693 Immune thrombocytopenic purpura: Secondary | ICD-10-CM

## 2015-07-25 DIAGNOSIS — I2510 Atherosclerotic heart disease of native coronary artery without angina pectoris: Secondary | ICD-10-CM | POA: Diagnosis not present

## 2015-07-25 DIAGNOSIS — Z951 Presence of aortocoronary bypass graft: Secondary | ICD-10-CM | POA: Diagnosis not present

## 2015-07-25 LAB — COMPREHENSIVE METABOLIC PANEL
ALT: 18 U/L (ref 0–55)
ANION GAP: 7 meq/L (ref 3–11)
AST: 20 U/L (ref 5–34)
Albumin: 3.4 g/dL — ABNORMAL LOW (ref 3.5–5.0)
Alkaline Phosphatase: 61 U/L (ref 40–150)
BUN: 19.8 mg/dL (ref 7.0–26.0)
CHLORIDE: 107 meq/L (ref 98–109)
CO2: 26 meq/L (ref 22–29)
CREATININE: 1.5 mg/dL — AB (ref 0.7–1.3)
Calcium: 9.6 mg/dL (ref 8.4–10.4)
EGFR: 41 mL/min/{1.73_m2} — ABNORMAL LOW (ref >90)
Glucose: 154 mg/dl — ABNORMAL HIGH (ref 70–140)
Potassium: 4.7 mEq/L (ref 3.5–5.1)
Sodium: 140 mEq/L (ref 136–145)
Total Bilirubin: 0.46 mg/dL (ref 0.20–1.20)
Total Protein: 6.3 g/dL — ABNORMAL LOW (ref 6.4–8.3)

## 2015-07-25 LAB — CBC WITH DIFFERENTIAL/PLATELET
BASO%: 0.2 % (ref 0.0–2.0)
Basophils Absolute: 0 10*3/uL (ref 0.0–0.1)
EOS%: 1.2 % (ref 0.0–7.0)
Eosinophils Absolute: 0.1 10*3/uL (ref 0.0–0.5)
HCT: 37.1 % — ABNORMAL LOW (ref 38.4–49.9)
HGB: 12.2 g/dL — ABNORMAL LOW (ref 13.0–17.1)
LYMPH%: 19 % (ref 14.0–49.0)
MCH: 31.7 pg (ref 27.2–33.4)
MCHC: 32.8 g/dL (ref 32.0–36.0)
MCV: 96.7 fL (ref 79.3–98.0)
MONO#: 0.6 10*3/uL (ref 0.1–0.9)
MONO%: 10.7 % (ref 0.0–14.0)
NEUT#: 3.6 10*3/uL (ref 1.5–6.5)
NEUT%: 68.9 % (ref 39.0–75.0)
Platelets: 139 10*3/uL — ABNORMAL LOW (ref 140–400)
RBC: 3.83 10*6/uL — AB (ref 4.20–5.82)
RDW: 12.6 % (ref 11.0–14.6)
WBC: 5.2 10*3/uL (ref 4.0–10.3)
lymph#: 1 10*3/uL (ref 0.9–3.3)

## 2015-07-25 MED ORDER — ROMIPLOSTIM 250 MCG ~~LOC~~ SOLR
0.5000 ug/kg | SUBCUTANEOUS | Status: DC
  Administered 2015-07-25: 50 ug via SUBCUTANEOUS
  Filled 2015-07-25: qty 0.1

## 2015-07-25 NOTE — Progress Notes (Signed)
.    Hematology oncology clinic note  Date of service: 07/25/2015  Patient Care Team: Hoyt Koch, MD as PCP - General (Internal Medicine)  CHIEF COMPLAINTS/PURPOSE OF CONSULTATION: Follow-up for ITP   Diagnosis: Idiopathic thrombocytopenic purpura   Current treatment: Nplate weekly to maintain reasonable platelet counts >50K. Requirement have ranged from 56mcg/kg to 58mcg/kg. Recently stable counts with 0.5 mcg/kg  Previous treatment: Steroids, IVIG.  HISTORY OF PRESENTING ILLNESS:  please see my previous clinic note from 09/28/2014 for details of initial presentation and course of treatment.  Interval history  Patient is here for follow-up regarding his ITP. His platelets have remained stable on 0.5 mcg/kg of NPlate. Has not noted any bleeding issues. Primary care physician managing his other comorbidities. No other acute new symptoms. No chest pain no shortness of breath no abdominal pain. Minimal pedal edema.   MEDICAL HISTORY:  Past Medical History  Diagnosis Date  . BPH (benign prostatic hypertrophy)   . Chronic renal insufficiency   . Coronary artery disease     CABG in 2000  . Diabetes type 2, controlled ([place])     Diet-controlled  . Diabetic neuropathy ([place])   . Hypertension   . Dyslipidemia   . ITP (idiopathic thrombocytopenic purpura)   . Osteoarthritis     SURGICAL HISTORY: Past Surgical History  Procedure Laterality Date  . Cholecystectomy    . Tonsillectomy    . Right hip replacement  2014  . Coronary artery bypass graft  2000    SOCIAL HISTORY: Social History   Social History  . Marital Status: Single    Spouse Name: N/A  . Number of Children: N/A  . Years of Education: N/A   Occupational History  . Not on file.   Social History Main Topics  . Smoking status: Former Smoker -- 2.00 packs/day for 25 years  . Smokeless tobacco: Never Used  . Alcohol Use: No  . Drug Use: No  . Sexual Activity: Not Currently   Other Topics Concern    . Not on file   Social History Narrative   He is living in [place] which is an assisted living facility. One of his son is also moving to [facility] and has a Designer, jewellery in religious studies. Patient notes that his other son is a Systems analyst with multiple awards.  FAMILY HISTORY: Family History  Problem Relation Age of Onset  . Family history unknown: Yes    ALLERGIES:  is allergic to penicillins.  MEDICATIONS:  Current Outpatient Prescriptions  Medication Sig Dispense Refill  . acetaminophen (TYLENOL) 500 MG tablet Take 250 mg by mouth 2 (two) times daily.     . Ascorbic Acid (VITAMIN C) 100 MG tablet Take 100 mg by mouth daily.    . Cholecalciferol (VITAMIN D-1000 MAX ST) 1000 UNITS tablet Take 1 tablet (1,000 Units total) by mouth daily.    . furosemide (LASIX) 40 MG tablet Take 1 tablet (40 mg total) by mouth daily. 30 tablet 0  . losartan (COZAAR) 100 MG tablet Take 100 mg by mouth daily. Reported on 05/04/2015    . magnesium chloride (SLOW-MAG) 64 MG TBEC SR tablet Take 1 tablet (64 mg total) by mouth daily. 60 tablet 1  . metoprolol tartrate (LOPRESSOR) 25 MG tablet Take 6.25 mg by mouth 2 (two) times daily.     Marland Kitchen omeprazole (PRILOSEC) 20 MG capsule Take 20 mg by mouth daily.    . simvastatin (ZOCOR) 20 MG tablet Take 20 mg by  mouth daily.     No current facility-administered medications for this visit.   Facility-Administered Medications Ordered in Other Visits  Medication Dose Route Frequency Provider Last Rate Last Dose  . romiPLOStim (NPLATE) injection 50 mcg  0.5 mcg/kg Subcutaneous Weekly [name], MD   50 mcg at 07/25/15 1100   Romiplostim 5 mcg/kg weekly   REVIEW OF SYSTEMS:   10 point review of system is negative except as noted above  PHYSICAL EXAMINATION: ECOG PERFORMANCE STATUS: 1 - Symptomatic but completely ambulatory  Filed Vitals:   07/25/15 0945  BP: 167/53  Pulse: 63  Temp: 97.5 F (36.4 C)  Resp: 21   Filed  Weights   07/25/15 0945  Weight: 219 lb 14.4 oz (99.746 kg)   GENERAL: Elderly gentleman in no acute distress:alert and comfortable SKIN: skin color, texture, turgor are normal, no rashes or significant lesions EYES: normal, conjunctiva are pink and non-injected, sclera clear OROPHARYNX:no exudate, no erythema and lips, buccal mucosa, and tongue normal  NECK: supple, thyroid normal size, non-tender, without nodularity LYMPH:  no palpable lymphadenopathy in the cervical, axillary or inguinal LUNGS: Air entry bilaterally equal, No rales no rhonchi HEART: regular rate & rhythm and 2 x 6 systolic murmur over aortic area, and no lower extremity edema ABDOMEN:abdomen soft, non-tender and normal bowel sounds PSYCH: alert & oriented x 3 with fluent speech NEURO: no focal motor/sensory deficits  LABORATORY DATA:   . CBC Latest Ref Rng 07/25/2015 07/18/2015 07/11/2015  WBC 4.0 - 10.3 10e3/uL 5.2 5.6 5.4  Hemoglobin 13.0 - 17.1 g/dL 12.2(L) 12.2(L) 12.4(L)  Hematocrit 38.4 - 49.9 % 37.1(L) 36.5(L) 37.5(L)  Platelets 140 - 400 10e3/uL 139(L) 115(L) 183    . CMP Latest Ref Rng 07/25/2015 05/23/2015 04/25/2015  Glucose 70 - 140 mg/dl 154(H) 121 140  BUN 7.0 - 26.0 mg/dL 19.8 22.9 21.5  Creatinine 0.7 - 1.3 mg/dL 1.5(H) 1.4(H) 1.5(H)  Sodium 136 - 145 mEq/L 140 142 139  Potassium 3.5 - 5.1 mEq/L 4.7 5.0 4.7  CO2 22 - 29 mEq/L 26 25 24   Calcium 8.4 - 10.4 mg/dL 9.6 9.3 9.2  Total Protein 6.4 - 8.3 g/dL 6.3(L) 6.8 6.5  Total Bilirubin 0.20 - 1.20 mg/dL 0.46 0.45 0.43  Alkaline Phos 40 - 150 U/L 61 62 67  AST 5 - 34 U/L 20 19 19   ALT 0 - 55 U/L 18 16 16     ASSESSMENT & PLAN:   82 year old Caucasian male with multiple medical comorbidities with  #1 chronic ITP since 1989. Has previously been responsive to steroids and has received IVIG on one occasion. He has been maintained on 3-65mcg/kg weekly of Nplate since mid 89FGE initially with Dr. Arvin Collard at [facility] and then with Dr. Jimmie Molly at [facility] in [place].  No issues with bleeding . Ultrasound abdomen showed normal spleen size SPEP- no obvious monoclonal protein. IFE showed possibility of an restrictive bands in the IgG and Lambda lanes. Patient's platelet counts have remained remarkably stable at Romiplostim doses of 63mcg/kg and was subsequently cut down to 0.5 mcg/kg and platelets have remained stable with this dose as well.  Plan  -continue Nplatelet 0.23mcg/kg qweekly and adjust per treatment parameters. -May switch to twice weekly CBCs given platelet stability and every 4 weekly CMP  #2  Coronary artery disease status post CABG in year 2000. Not on aspirin due to his ITP . BNP was elevated. ECHO today shows mildly reduced systolic function and LVH  Patient notes  his breathing has improved and his leg swelling is down. #3 chronic kidney disease - creatinine overall stable today @ 1.4. Potassium today 5  Plan -low salt intake - We'll defer further management for these to Dr. Sharlet Salina.  Return to clinic with Dr Irene Limbo in 2 months Continue weekly Nplate  Total time spent 15 minutes more than 50% time on direct patient contact counseling and coordination of care.    Sullivan Lone MD [place] Hematology/Oncology Physician [facility]  (Office):       [phone] (Work cell):  [phone] (Fax):           [phone]

## 2015-07-25 NOTE — Telephone Encounter (Signed)
Pt aware of apt .Marland Kitchen Refused print out

## 2015-08-01 ENCOUNTER — Ambulatory Visit (HOSPITAL_BASED_OUTPATIENT_CLINIC_OR_DEPARTMENT_OTHER): Payer: Medicare Other

## 2015-08-01 ENCOUNTER — Encounter: Payer: Self-pay | Admitting: *Deleted

## 2015-08-01 VITALS — BP 174/62 | HR 53

## 2015-08-01 DIAGNOSIS — D693 Immune thrombocytopenic purpura: Secondary | ICD-10-CM

## 2015-08-01 MED ORDER — CYANOCOBALAMIN 1000 MCG/ML IJ SOLN
1000.0000 ug | INTRAMUSCULAR | Status: DC
  Administered 2015-08-01: 1000 ug via SUBCUTANEOUS

## 2015-08-01 MED ORDER — ROMIPLOSTIM 250 MCG ~~LOC~~ SOLR
0.5000 ug/kg | SUBCUTANEOUS | Status: DC
  Administered 2015-08-01: 50 ug via SUBCUTANEOUS
  Filled 2015-08-01: qty 0.1

## 2015-08-01 NOTE — Patient Instructions (Signed)
Romiplostim injection What is this medicine? ROMIPLOSTIM ([place] mi PLOE stim) helps your body make more platelets. This medicine is used to treat low platelets caused by chronic idiopathic thrombocytopenic purpura (ITP). This medicine may be used for other purposes; ask your health care provider or pharmacist if you have questions. What should I tell my health care provider before I take this medicine? They need to know if you have any of these conditions: -cancer or myelodysplastic syndrome -low blood counts, like low white cell, platelet, or red cell counts -take medicines that treat or prevent blood clots -an unusual or allergic reaction to romiplostim, mannitol, other medicines, foods, dyes, or preservatives -pregnant or trying to get pregnant -breast-feeding How should I use this medicine? This medicine is for injection under the skin. It is given by a health care professional in a hospital or clinic setting. A special MedGuide will be given to you before your injection. Read this information carefully each time. Talk to your pediatrician regarding the use of this medicine in children. Special care may be needed. Overdosage: If you think you have taken too much of this medicine contact a poison control center or emergency room at once. NOTE: This medicine is only for you. Do not share this medicine with others. What if I miss a dose? It is important not to miss your dose. Call your doctor or health care professional if you are unable to keep an appointment. What may interact with this medicine? Interactions are not expected. This list may not describe all possible interactions. Give your health care provider a list of all the medicines, herbs, non-prescription drugs, or dietary supplements you use. Also tell them if you smoke, drink alcohol, or use illegal drugs. Some items may interact with your medicine. What should I watch for while using this medicine? Your condition will be monitored  carefully while you are receiving this medicine. Visit your prescriber or health care professional for regular checks on your progress and for the needed blood tests. It is important to keep all appointments. What side effects may I notice from receiving this medicine? Side effects that you should report to your doctor or health care professional as soon as possible: -allergic reactions like skin rash, itching or hives, swelling of the face, lips, or tongue -shortness of breath, chest pain, swelling in a leg -unusual bleeding or bruising Side effects that usually do not require medical attention (report to your doctor or health care professional if they continue or are bothersome): -dizziness -headache -muscle aches -pain in arms and legs -stomach pain -trouble sleeping This list may not describe all possible side effects. Call your doctor for medical advice about side effects. You may report side effects to FDA at 1-800-FDA-1088. Where should I keep my medicine? This drug is given in a hospital or clinic and will not be stored at home. NOTE: This sheet is a summary. It may not cover all possible information. If you have questions about this medicine, talk to your doctor, pharmacist, or health care provider.    2016, Elsevier/Gold Standard. (2007-10-12 15:13:04) Cyanocobalamin, Vitamin B12 injection What is this medicine? CYANOCOBALAMIN (sye an oh koe BAL a min) is a man made form of vitamin B12. Vitamin B12 is used in the growth of healthy blood cells, nerve cells, and proteins in the body. It also helps with the metabolism of fats and carbohydrates. This medicine is used to treat people who can not absorb vitamin B12. This medicine may be used for other   purposes; ask your health care provider or pharmacist if you have questions. What should I tell my health care provider before I take this medicine? They need to know if you have any of these conditions: -kidney disease -Leber's  disease -megaloblastic anemia -an unusual or allergic reaction to cyanocobalamin, cobalt, other medicines, foods, dyes, or preservatives -pregnant or trying to get pregnant -breast-feeding How should I use this medicine? This medicine is injected into a muscle or deeply under the skin. It is usually given by a health care professional in a clinic or doctor's office. However, your doctor may teach you how to inject yourself. Follow all instructions. Talk to your pediatrician regarding the use of this medicine in children. Special care may be needed. Overdosage: If you think you have taken too much of this medicine contact a poison control center or emergency room at once. NOTE: This medicine is only for you. Do not share this medicine with others. What if I miss a dose? If you are given your dose at a clinic or doctor's office, call to reschedule your appointment. If you give your own injections and you miss a dose, take it as soon as you can. If it is almost time for your next dose, take only that dose. Do not take double or extra doses. What may interact with this medicine? -colchicine -heavy alcohol intake This list may not describe all possible interactions. Give your health care provider a list of all the medicines, herbs, non-prescription drugs, or dietary supplements you use. Also tell them if you smoke, drink alcohol, or use illegal drugs. Some items may interact with your medicine. What should I watch for while using this medicine? Visit your doctor or health care professional regularly. You may need blood work done while you are taking this medicine. You may need to follow a special diet. Talk to your doctor. Limit your alcohol intake and avoid smoking to get the best benefit. What side effects may I notice from receiving this medicine? Side effects that you should report to your doctor or health care professional as soon as possible: -allergic reactions like skin rash, itching or  hives, swelling of the face, lips, or tongue -blue tint to skin -chest tightness, pain -difficulty breathing, wheezing -dizziness -red, swollen painful area on the leg Side effects that usually do not require medical attention (report to your doctor or health care professional if they continue or are bothersome): -diarrhea -headache This list may not describe all possible side effects. Call your doctor for medical advice about side effects. You may report side effects to FDA at 1-800-FDA-1088. Where should I keep my medicine? Keep out of the reach of children. Store at room temperature between 15 and 30 degrees C (59 and 85 degrees F). Protect from light. Throw away any unused medicine after the expiration date. NOTE: This sheet is a summary. It may not cover all possible information. If you have questions about this medicine, talk to your doctor, pharmacist, or health care provider.    2016, Elsevier/Gold Standard. (2007-05-25 22:10:20)  

## 2015-08-08 ENCOUNTER — Ambulatory Visit (HOSPITAL_BASED_OUTPATIENT_CLINIC_OR_DEPARTMENT_OTHER): Payer: Medicare Other

## 2015-08-08 ENCOUNTER — Other Ambulatory Visit (HOSPITAL_BASED_OUTPATIENT_CLINIC_OR_DEPARTMENT_OTHER): Payer: Medicare Other

## 2015-08-08 VITALS — BP 160/51 | HR 55 | Temp 97.7°F | Resp 22

## 2015-08-08 DIAGNOSIS — D693 Immune thrombocytopenic purpura: Secondary | ICD-10-CM | POA: Diagnosis not present

## 2015-08-08 LAB — CBC & DIFF AND RETIC
BASO%: 0.2 % (ref 0.0–2.0)
Basophils Absolute: 0 10*3/uL (ref 0.0–0.1)
EOS%: 1 % (ref 0.0–7.0)
Eosinophils Absolute: 0.1 10*3/uL (ref 0.0–0.5)
HCT: 37.2 % — ABNORMAL LOW (ref 38.4–49.9)
HEMOGLOBIN: 12.4 g/dL — AB (ref 13.0–17.1)
Immature Retic Fract: 11.2 % — ABNORMAL HIGH (ref 3.00–10.60)
LYMPH%: 24.3 % (ref 14.0–49.0)
MCH: 32.1 pg (ref 27.2–33.4)
MCHC: 33.3 g/dL (ref 32.0–36.0)
MCV: 96.4 fL (ref 79.3–98.0)
MONO#: 0.6 10*3/uL (ref 0.1–0.9)
MONO%: 11.4 % (ref 0.0–14.0)
NEUT%: 63.1 % (ref 39.0–75.0)
NEUTROS ABS: 3.1 10*3/uL (ref 1.5–6.5)
Platelets: 132 10*3/uL — ABNORMAL LOW (ref 140–400)
RBC: 3.86 10*6/uL — ABNORMAL LOW (ref 4.20–5.82)
RDW: 12.7 % (ref 11.0–14.6)
Retic %: 1.65 % (ref 0.80–1.80)
Retic Ct Abs: 63.69 10*3/uL (ref 34.80–93.90)
WBC: 5 10*3/uL (ref 4.0–10.3)
lymph#: 1.2 10*3/uL (ref 0.9–3.3)

## 2015-08-08 LAB — COMPREHENSIVE METABOLIC PANEL
ALBUMIN: 3.4 g/dL — AB (ref 3.5–5.0)
ALK PHOS: 60 U/L (ref 40–150)
ALT: 20 U/L (ref 0–55)
AST: 21 U/L (ref 5–34)
Anion Gap: 8 mEq/L (ref 3–11)
BUN: 23.5 mg/dL (ref 7.0–26.0)
CO2: 23 mEq/L (ref 22–29)
Calcium: 9.3 mg/dL (ref 8.4–10.4)
Chloride: 107 mEq/L (ref 98–109)
Creatinine: 1.6 mg/dL — ABNORMAL HIGH (ref 0.7–1.3)
EGFR: 36 mL/min/{1.73_m2} — AB (ref >90)
Glucose: 157 mg/dl — ABNORMAL HIGH (ref 70–140)
POTASSIUM: 4.5 meq/L (ref 3.5–5.1)
SODIUM: 138 meq/L (ref 136–145)
Total Bilirubin: 0.53 mg/dL (ref 0.20–1.20)
Total Protein: 6.6 g/dL (ref 6.4–8.3)

## 2015-08-08 MED ORDER — ROMIPLOSTIM 250 MCG ~~LOC~~ SOLR
50.0000 ug | SUBCUTANEOUS | Status: DC
  Administered 2015-08-08: 50 ug via SUBCUTANEOUS
  Filled 2015-08-08: qty 0.1

## 2015-08-08 NOTE — Patient Instructions (Signed)
Romiplostim injection What is this medicine? ROMIPLOSTIM ([place] mi PLOE stim) helps your body make more platelets. This medicine is used to treat low platelets caused by chronic idiopathic thrombocytopenic purpura (ITP). This medicine may be used for other purposes; ask your health care provider or pharmacist if you have questions. What should I tell my health care provider before I take this medicine? They need to know if you have any of these conditions: -cancer or myelodysplastic syndrome -low blood counts, like low white cell, platelet, or red cell counts -take medicines that treat or prevent blood clots -an unusual or allergic reaction to romiplostim, mannitol, other medicines, foods, dyes, or preservatives -pregnant or trying to get pregnant -breast-feeding How should I use this medicine? This medicine is for injection under the skin. It is given by a health care professional in a hospital or clinic setting. A special MedGuide will be given to you before your injection. Read this information carefully each time. Talk to your pediatrician regarding the use of this medicine in children. Special care may be needed. Overdosage: If you think you have taken too much of this medicine contact a poison control center or emergency room at once. NOTE: This medicine is only for you. Do not share this medicine with others. What if I miss a dose? It is important not to miss your dose. Call your doctor or health care professional if you are unable to keep an appointment. What may interact with this medicine? Interactions are not expected. This list may not describe all possible interactions. Give your health care provider a list of all the medicines, herbs, non-prescription drugs, or dietary supplements you use. Also tell them if you smoke, drink alcohol, or use illegal drugs. Some items may interact with your medicine. What should I watch for while using this medicine? Your condition will be monitored  carefully while you are receiving this medicine. Visit your prescriber or health care professional for regular checks on your progress and for the needed blood tests. It is important to keep all appointments. What side effects may I notice from receiving this medicine? Side effects that you should report to your doctor or health care professional as soon as possible: -allergic reactions like skin rash, itching or hives, swelling of the face, lips, or tongue -shortness of breath, chest pain, swelling in a leg -unusual bleeding or bruising Side effects that usually do not require medical attention (report to your doctor or health care professional if they continue or are bothersome): -dizziness -headache -muscle aches -pain in arms and legs -stomach pain -trouble sleeping This list may not describe all possible side effects. Call your doctor for medical advice about side effects. You may report side effects to FDA at 1-800-FDA-1088. Where should I keep my medicine? This drug is given in a hospital or clinic and will not be stored at home. NOTE: This sheet is a summary. It may not cover all possible information. If you have questions about this medicine, talk to your doctor, pharmacist, or health care provider.    2016, Elsevier/Gold Standard. (2007-10-12 15:13:04) Cyanocobalamin, Vitamin B12 injection What is this medicine? CYANOCOBALAMIN (sye an oh koe BAL a min) is a man made form of vitamin B12. Vitamin B12 is used in the growth of healthy blood cells, nerve cells, and proteins in the body. It also helps with the metabolism of fats and carbohydrates. This medicine is used to treat people who can not absorb vitamin B12. This medicine may be used for other   purposes; ask your health care provider or pharmacist if you have questions. What should I tell my health care provider before I take this medicine? They need to know if you have any of these conditions: -kidney disease -Leber's  disease -megaloblastic anemia -an unusual or allergic reaction to cyanocobalamin, cobalt, other medicines, foods, dyes, or preservatives -pregnant or trying to get pregnant -breast-feeding How should I use this medicine? This medicine is injected into a muscle or deeply under the skin. It is usually given by a health care professional in a clinic or doctor's office. However, your doctor may teach you how to inject yourself. Follow all instructions. Talk to your pediatrician regarding the use of this medicine in children. Special care may be needed. Overdosage: If you think you have taken too much of this medicine contact a poison control center or emergency room at once. NOTE: This medicine is only for you. Do not share this medicine with others. What if I miss a dose? If you are given your dose at a clinic or doctor's office, call to reschedule your appointment. If you give your own injections and you miss a dose, take it as soon as you can. If it is almost time for your next dose, take only that dose. Do not take double or extra doses. What may interact with this medicine? -colchicine -heavy alcohol intake This list may not describe all possible interactions. Give your health care provider a list of all the medicines, herbs, non-prescription drugs, or dietary supplements you use. Also tell them if you smoke, drink alcohol, or use illegal drugs. Some items may interact with your medicine. What should I watch for while using this medicine? Visit your doctor or health care professional regularly. You may need blood work done while you are taking this medicine. You may need to follow a special diet. Talk to your doctor. Limit your alcohol intake and avoid smoking to get the best benefit. What side effects may I notice from receiving this medicine? Side effects that you should report to your doctor or health care professional as soon as possible: -allergic reactions like skin rash, itching or  hives, swelling of the face, lips, or tongue -blue tint to skin -chest tightness, pain -difficulty breathing, wheezing -dizziness -red, swollen painful area on the leg Side effects that usually do not require medical attention (report to your doctor or health care professional if they continue or are bothersome): -diarrhea -headache This list may not describe all possible side effects. Call your doctor for medical advice about side effects. You may report side effects to FDA at 1-800-FDA-1088. Where should I keep my medicine? Keep out of the reach of children. Store at room temperature between 15 and 30 degrees C (59 and 85 degrees F). Protect from light. Throw away any unused medicine after the expiration date. NOTE: This sheet is a summary. It may not cover all possible information. If you have questions about this medicine, talk to your doctor, pharmacist, or health care provider.    2016, Elsevier/Gold Standard. (2007-05-25 22:10:20)  

## 2015-08-15 ENCOUNTER — Ambulatory Visit (HOSPITAL_BASED_OUTPATIENT_CLINIC_OR_DEPARTMENT_OTHER): Payer: Medicare Other

## 2015-08-15 VITALS — BP 157/72 | HR 57 | Temp 97.9°F | Resp 24

## 2015-08-15 DIAGNOSIS — D693 Immune thrombocytopenic purpura: Secondary | ICD-10-CM

## 2015-08-15 MED ORDER — ROMIPLOSTIM 250 MCG ~~LOC~~ SOLR
50.0000 ug | SUBCUTANEOUS | Status: DC
  Administered 2015-08-15: 50 ug via SUBCUTANEOUS
  Filled 2015-08-15: qty 0.1

## 2015-08-15 NOTE — Patient Instructions (Signed)
Romiplostim injection What is this medicine? ROMIPLOSTIM ([place] mi PLOE stim) helps your body make more platelets. This medicine is used to treat low platelets caused by chronic idiopathic thrombocytopenic purpura (ITP). This medicine may be used for other purposes; ask your health care provider or pharmacist if you have questions. What should I tell my health care provider before I take this medicine? They need to know if you have any of these conditions: -cancer or myelodysplastic syndrome -low blood counts, like low white cell, platelet, or red cell counts -take medicines that treat or prevent blood clots -an unusual or allergic reaction to romiplostim, mannitol, other medicines, foods, dyes, or preservatives -pregnant or trying to get pregnant -breast-feeding How should I use this medicine? This medicine is for injection under the skin. It is given by a health care professional in a hospital or clinic setting. A special MedGuide will be given to you before your injection. Read this information carefully each time. Talk to your pediatrician regarding the use of this medicine in children. Special care may be needed. Overdosage: If you think you have taken too much of this medicine contact a poison control center or emergency room at once. NOTE: This medicine is only for you. Do not share this medicine with others. What if I miss a dose? It is important not to miss your dose. Call your doctor or health care professional if you are unable to keep an appointment. What may interact with this medicine? Interactions are not expected. This list may not describe all possible interactions. Give your health care provider a list of all the medicines, herbs, non-prescription drugs, or dietary supplements you use. Also tell them if you smoke, drink alcohol, or use illegal drugs. Some items may interact with your medicine. What should I watch for while using this medicine? Your condition will be monitored  carefully while you are receiving this medicine. Visit your prescriber or health care professional for regular checks on your progress and for the needed blood tests. It is important to keep all appointments. What side effects may I notice from receiving this medicine? Side effects that you should report to your doctor or health care professional as soon as possible: -allergic reactions like skin rash, itching or hives, swelling of the face, lips, or tongue -shortness of breath, chest pain, swelling in a leg -unusual bleeding or bruising Side effects that usually do not require medical attention (report to your doctor or health care professional if they continue or are bothersome): -dizziness -headache -muscle aches -pain in arms and legs -stomach pain -trouble sleeping This list may not describe all possible side effects. Call your doctor for medical advice about side effects. You may report side effects to FDA at 1-800-FDA-1088. Where should I keep my medicine? This drug is given in a hospital or clinic and will not be stored at home. NOTE: This sheet is a summary. It may not cover all possible information. If you have questions about this medicine, talk to your doctor, pharmacist, or health care provider.    2016, Elsevier/Gold Standard. (2007-10-12 15:13:04) Cyanocobalamin, Vitamin B12 injection What is this medicine? CYANOCOBALAMIN (sye an oh koe BAL a min) is a man made form of vitamin B12. Vitamin B12 is used in the growth of healthy blood cells, nerve cells, and proteins in the body. It also helps with the metabolism of fats and carbohydrates. This medicine is used to treat people who can not absorb vitamin B12. This medicine may be used for other   purposes; ask your health care provider or pharmacist if you have questions. What should I tell my health care provider before I take this medicine? They need to know if you have any of these conditions: -kidney disease -Leber's  disease -megaloblastic anemia -an unusual or allergic reaction to cyanocobalamin, cobalt, other medicines, foods, dyes, or preservatives -pregnant or trying to get pregnant -breast-feeding How should I use this medicine? This medicine is injected into a muscle or deeply under the skin. It is usually given by a health care professional in a clinic or doctor's office. However, your doctor may teach you how to inject yourself. Follow all instructions. Talk to your pediatrician regarding the use of this medicine in children. Special care may be needed. Overdosage: If you think you have taken too much of this medicine contact a poison control center or emergency room at once. NOTE: This medicine is only for you. Do not share this medicine with others. What if I miss a dose? If you are given your dose at a clinic or doctor's office, call to reschedule your appointment. If you give your own injections and you miss a dose, take it as soon as you can. If it is almost time for your next dose, take only that dose. Do not take double or extra doses. What may interact with this medicine? -colchicine -heavy alcohol intake This list may not describe all possible interactions. Give your health care provider a list of all the medicines, herbs, non-prescription drugs, or dietary supplements you use. Also tell them if you smoke, drink alcohol, or use illegal drugs. Some items may interact with your medicine. What should I watch for while using this medicine? Visit your doctor or health care professional regularly. You may need blood work done while you are taking this medicine. You may need to follow a special diet. Talk to your doctor. Limit your alcohol intake and avoid smoking to get the best benefit. What side effects may I notice from receiving this medicine? Side effects that you should report to your doctor or health care professional as soon as possible: -allergic reactions like skin rash, itching or  hives, swelling of the face, lips, or tongue -blue tint to skin -chest tightness, pain -difficulty breathing, wheezing -dizziness -red, swollen painful area on the leg Side effects that usually do not require medical attention (report to your doctor or health care professional if they continue or are bothersome): -diarrhea -headache This list may not describe all possible side effects. Call your doctor for medical advice about side effects. You may report side effects to FDA at 1-800-FDA-1088. Where should I keep my medicine? Keep out of the reach of children. Store at room temperature between 15 and 30 degrees C (59 and 85 degrees F). Protect from light. Throw away any unused medicine after the expiration date. NOTE: This sheet is a summary. It may not cover all possible information. If you have questions about this medicine, talk to your doctor, pharmacist, or health care provider.    2016, Elsevier/Gold Standard. (2007-05-25 22:10:20)  

## 2015-08-17 DIAGNOSIS — D485 Neoplasm of uncertain behavior of skin: Secondary | ICD-10-CM | POA: Diagnosis not present

## 2015-08-17 DIAGNOSIS — C4492 Squamous cell carcinoma of skin, unspecified: Secondary | ICD-10-CM

## 2015-08-17 DIAGNOSIS — D0462 Carcinoma in situ of skin of left upper limb, including shoulder: Secondary | ICD-10-CM | POA: Diagnosis not present

## 2015-08-17 DIAGNOSIS — L570 Actinic keratosis: Secondary | ICD-10-CM | POA: Diagnosis not present

## 2015-08-17 HISTORY — DX: Squamous cell carcinoma of skin, unspecified: C44.92

## 2015-08-22 ENCOUNTER — Other Ambulatory Visit (HOSPITAL_BASED_OUTPATIENT_CLINIC_OR_DEPARTMENT_OTHER): Payer: Medicare Other

## 2015-08-22 ENCOUNTER — Ambulatory Visit (HOSPITAL_BASED_OUTPATIENT_CLINIC_OR_DEPARTMENT_OTHER): Payer: Medicare Other

## 2015-08-22 VITALS — BP 158/51 | HR 58 | Temp 97.6°F | Resp 22

## 2015-08-22 DIAGNOSIS — D693 Immune thrombocytopenic purpura: Secondary | ICD-10-CM

## 2015-08-22 LAB — CBC & DIFF AND RETIC
BASO%: 0.1 % (ref 0.0–2.0)
BASOS ABS: 0 10*3/uL (ref 0.0–0.1)
EOS ABS: 0.1 10*3/uL (ref 0.0–0.5)
EOS%: 0.9 % (ref 0.0–7.0)
HCT: 36.9 % — ABNORMAL LOW (ref 38.4–49.9)
HEMOGLOBIN: 12.3 g/dL — AB (ref 13.0–17.1)
IMMATURE RETIC FRACT: 11.5 % — AB (ref 3.00–10.60)
LYMPH%: 21.3 % (ref 14.0–49.0)
MCH: 32.2 pg (ref 27.2–33.4)
MCHC: 33.3 g/dL (ref 32.0–36.0)
MCV: 96.6 fL (ref 79.3–98.0)
MONO#: 0.8 10*3/uL (ref 0.1–0.9)
MONO%: 11.5 % (ref 0.0–14.0)
NEUT%: 66.2 % (ref 39.0–75.0)
NEUTROS ABS: 4.5 10*3/uL (ref 1.5–6.5)
PLATELETS: 149 10*3/uL (ref 140–400)
RBC: 3.82 10*6/uL — ABNORMAL LOW (ref 4.20–5.82)
RDW: 12.9 % (ref 11.0–14.6)
Retic %: 1.79 % (ref 0.80–1.80)
Retic Ct Abs: 68.38 10*3/uL (ref 34.80–93.90)
WBC: 6.8 10*3/uL (ref 4.0–10.3)
lymph#: 1.4 10*3/uL (ref 0.9–3.3)
nRBC: 0 % (ref 0–0)

## 2015-08-22 MED ORDER — ROMIPLOSTIM 250 MCG ~~LOC~~ SOLR
50.0000 ug | SUBCUTANEOUS | Status: DC
  Administered 2015-08-22: 50 ug via SUBCUTANEOUS
  Filled 2015-08-22: qty 0.1

## 2015-08-22 NOTE — Patient Instructions (Signed)
Romiplostim injection What is this medicine? ROMIPLOSTIM ([place] mi PLOE stim) helps your body make more platelets. This medicine is used to treat low platelets caused by chronic idiopathic thrombocytopenic purpura (ITP). This medicine may be used for other purposes; ask your health care provider or pharmacist if you have questions. What should I tell my health care provider before I take this medicine? They need to know if you have any of these conditions: -cancer or myelodysplastic syndrome -low blood counts, like low white cell, platelet, or red cell counts -take medicines that treat or prevent blood clots -an unusual or allergic reaction to romiplostim, mannitol, other medicines, foods, dyes, or preservatives -pregnant or trying to get pregnant -breast-feeding How should I use this medicine? This medicine is for injection under the skin. It is given by a health care professional in a hospital or clinic setting. A special MedGuide will be given to you before your injection. Read this information carefully each time. Talk to your pediatrician regarding the use of this medicine in children. Special care may be needed. Overdosage: If you think you have taken too much of this medicine contact a poison control center or emergency room at once. NOTE: This medicine is only for you. Do not share this medicine with others. What if I miss a dose? It is important not to miss your dose. Call your doctor or health care professional if you are unable to keep an appointment. What may interact with this medicine? Interactions are not expected. This list may not describe all possible interactions. Give your health care provider a list of all the medicines, herbs, non-prescription drugs, or dietary supplements you use. Also tell them if you smoke, drink alcohol, or use illegal drugs. Some items may interact with your medicine. What should I watch for while using this medicine? Your condition will be monitored  carefully while you are receiving this medicine. Visit your prescriber or health care professional for regular checks on your progress and for the needed blood tests. It is important to keep all appointments. What side effects may I notice from receiving this medicine? Side effects that you should report to your doctor or health care professional as soon as possible: -allergic reactions like skin rash, itching or hives, swelling of the face, lips, or tongue -shortness of breath, chest pain, swelling in a leg -unusual bleeding or bruising Side effects that usually do not require medical attention (report to your doctor or health care professional if they continue or are bothersome): -dizziness -headache -muscle aches -pain in arms and legs -stomach pain -trouble sleeping This list may not describe all possible side effects. Call your doctor for medical advice about side effects. You may report side effects to FDA at 1-800-FDA-1088. Where should I keep my medicine? This drug is given in a hospital or clinic and will not be stored at home. NOTE: This sheet is a summary. It may not cover all possible information. If you have questions about this medicine, talk to your doctor, pharmacist, or health care provider.    2016, Elsevier/Gold Standard. (2007-10-12 15:13:04) Cyanocobalamin, Vitamin B12 injection What is this medicine? CYANOCOBALAMIN (sye an oh koe BAL a min) is a man made form of vitamin B12. Vitamin B12 is used in the growth of healthy blood cells, nerve cells, and proteins in the body. It also helps with the metabolism of fats and carbohydrates. This medicine is used to treat people who can not absorb vitamin B12. This medicine may be used for other   purposes; ask your health care provider or pharmacist if you have questions. What should I tell my health care provider before I take this medicine? They need to know if you have any of these conditions: -kidney disease -Leber's  disease -megaloblastic anemia -an unusual or allergic reaction to cyanocobalamin, cobalt, other medicines, foods, dyes, or preservatives -pregnant or trying to get pregnant -breast-feeding How should I use this medicine? This medicine is injected into a muscle or deeply under the skin. It is usually given by a health care professional in a clinic or doctor's office. However, your doctor may teach you how to inject yourself. Follow all instructions. Talk to your pediatrician regarding the use of this medicine in children. Special care may be needed. Overdosage: If you think you have taken too much of this medicine contact a poison control center or emergency room at once. NOTE: This medicine is only for you. Do not share this medicine with others. What if I miss a dose? If you are given your dose at a clinic or doctor's office, call to reschedule your appointment. If you give your own injections and you miss a dose, take it as soon as you can. If it is almost time for your next dose, take only that dose. Do not take double or extra doses. What may interact with this medicine? -colchicine -heavy alcohol intake This list may not describe all possible interactions. Give your health care provider a list of all the medicines, herbs, non-prescription drugs, or dietary supplements you use. Also tell them if you smoke, drink alcohol, or use illegal drugs. Some items may interact with your medicine. What should I watch for while using this medicine? Visit your doctor or health care professional regularly. You may need blood work done while you are taking this medicine. You may need to follow a special diet. Talk to your doctor. Limit your alcohol intake and avoid smoking to get the best benefit. What side effects may I notice from receiving this medicine? Side effects that you should report to your doctor or health care professional as soon as possible: -allergic reactions like skin rash, itching or  hives, swelling of the face, lips, or tongue -blue tint to skin -chest tightness, pain -difficulty breathing, wheezing -dizziness -red, swollen painful area on the leg Side effects that usually do not require medical attention (report to your doctor or health care professional if they continue or are bothersome): -diarrhea -headache This list may not describe all possible side effects. Call your doctor for medical advice about side effects. You may report side effects to FDA at 1-800-FDA-1088. Where should I keep my medicine? Keep out of the reach of children. Store at room temperature between 15 and 30 degrees C (59 and 85 degrees F). Protect from light. Throw away any unused medicine after the expiration date. NOTE: This sheet is a summary. It may not cover all possible information. If you have questions about this medicine, talk to your doctor, pharmacist, or health care provider.    2016, Elsevier/Gold Standard. (2007-05-25 22:10:20)  

## 2015-08-24 ENCOUNTER — Telehealth: Payer: Self-pay | Admitting: Internal Medicine

## 2015-08-24 NOTE — Telephone Encounter (Signed)
Requesting home health PT evaluation orders for balance deficit and fall risk.

## 2015-08-25 NOTE — Telephone Encounter (Signed)
Called and gave verbal orders 

## 2015-08-28 ENCOUNTER — Telehealth: Payer: Self-pay

## 2015-08-28 NOTE — Telephone Encounter (Signed)
I have called the patient and the sons, both a couple of times. NO answer from anyone and NO VM set up. Will try again later

## 2015-08-30 ENCOUNTER — Ambulatory Visit: Payer: Medicare Other

## 2015-09-05 ENCOUNTER — Other Ambulatory Visit (HOSPITAL_BASED_OUTPATIENT_CLINIC_OR_DEPARTMENT_OTHER): Payer: Medicare Other

## 2015-09-05 ENCOUNTER — Ambulatory Visit (HOSPITAL_BASED_OUTPATIENT_CLINIC_OR_DEPARTMENT_OTHER): Payer: Medicare Other

## 2015-09-05 VITALS — BP 155/54 | HR 87 | Temp 98.3°F | Resp 20

## 2015-09-05 DIAGNOSIS — D693 Immune thrombocytopenic purpura: Secondary | ICD-10-CM

## 2015-09-05 LAB — CBC & DIFF AND RETIC
BASO%: 0.2 % (ref 0.0–2.0)
Basophils Absolute: 0 10*3/uL (ref 0.0–0.1)
EOS%: 1.1 % (ref 0.0–7.0)
Eosinophils Absolute: 0.1 10*3/uL (ref 0.0–0.5)
HCT: 36.1 % — ABNORMAL LOW (ref 38.4–49.9)
HGB: 12 g/dL — ABNORMAL LOW (ref 13.0–17.1)
Immature Retic Fract: 14.4 % — ABNORMAL HIGH (ref 3.00–10.60)
LYMPH%: 21.8 % (ref 14.0–49.0)
MCH: 32.2 pg (ref 27.2–33.4)
MCHC: 33.2 g/dL (ref 32.0–36.0)
MCV: 96.8 fL (ref 79.3–98.0)
MONO#: 0.6 10*3/uL (ref 0.1–0.9)
MONO%: 11.3 % (ref 0.0–14.0)
NEUT%: 65.6 % (ref 39.0–75.0)
NEUTROS ABS: 3.7 10*3/uL (ref 1.5–6.5)
Platelets: 102 10*3/uL — ABNORMAL LOW (ref 140–400)
RBC: 3.73 10*6/uL — AB (ref 4.20–5.82)
RDW: 12.9 % (ref 11.0–14.6)
RETIC %: 1.8 % (ref 0.80–1.80)
Retic Ct Abs: 67.14 10*3/uL (ref 34.80–93.90)
WBC: 5.6 10*3/uL (ref 4.0–10.3)
lymph#: 1.2 10*3/uL (ref 0.9–3.3)

## 2015-09-05 LAB — COMPREHENSIVE METABOLIC PANEL
ALBUMIN: 3.3 g/dL — AB (ref 3.5–5.0)
ALK PHOS: 68 U/L (ref 40–150)
ALT: 20 U/L (ref 0–55)
AST: 21 U/L (ref 5–34)
Anion Gap: 7 mEq/L (ref 3–11)
BUN: 23.6 mg/dL (ref 7.0–26.0)
CHLORIDE: 108 meq/L (ref 98–109)
CO2: 25 meq/L (ref 22–29)
Calcium: 9.2 mg/dL (ref 8.4–10.4)
Creatinine: 1.5 mg/dL — ABNORMAL HIGH (ref 0.7–1.3)
EGFR: 40 mL/min/{1.73_m2} — AB (ref >90)
GLUCOSE: 158 mg/dL — AB (ref 70–140)
POTASSIUM: 4.6 meq/L (ref 3.5–5.1)
SODIUM: 140 meq/L (ref 136–145)
Total Bilirubin: 0.53 mg/dL (ref 0.20–1.20)
Total Protein: 6.5 g/dL (ref 6.4–8.3)

## 2015-09-05 MED ORDER — ROMIPLOSTIM 250 MCG ~~LOC~~ SOLR
50.0000 ug | SUBCUTANEOUS | Status: DC
  Administered 2015-09-05: 50 ug via SUBCUTANEOUS
  Filled 2015-09-05: qty 0.1

## 2015-09-05 MED ORDER — CYANOCOBALAMIN 1000 MCG/ML IJ SOLN
1000.0000 ug | INTRAMUSCULAR | Status: DC
  Administered 2015-09-05: 1000 ug via SUBCUTANEOUS

## 2015-09-05 NOTE — Patient Instructions (Signed)
Romiplostim injection What is this medicine? ROMIPLOSTIM ([place] mi PLOE stim) helps your body make more platelets. This medicine is used to treat low platelets caused by chronic idiopathic thrombocytopenic purpura (ITP). This medicine may be used for other purposes; ask your health care provider or pharmacist if you have questions. What should I tell my health care provider before I take this medicine? They need to know if you have any of these conditions: -cancer or myelodysplastic syndrome -low blood counts, like low white cell, platelet, or red cell counts -take medicines that treat or prevent blood clots -an unusual or allergic reaction to romiplostim, mannitol, other medicines, foods, dyes, or preservatives -pregnant or trying to get pregnant -breast-feeding How should I use this medicine? This medicine is for injection under the skin. It is given by a health care professional in a hospital or clinic setting. A special MedGuide will be given to you before your injection. Read this information carefully each time. Talk to your pediatrician regarding the use of this medicine in children. Special care may be needed. Overdosage: If you think you have taken too much of this medicine contact a poison control center or emergency room at once. NOTE: This medicine is only for you. Do not share this medicine with others. What if I miss a dose? It is important not to miss your dose. Call your doctor or health care professional if you are unable to keep an appointment. What may interact with this medicine? Interactions are not expected. This list may not describe all possible interactions. Give your health care provider a list of all the medicines, herbs, non-prescription drugs, or dietary supplements you use. Also tell them if you smoke, drink alcohol, or use illegal drugs. Some items may interact with your medicine. What should I watch for while using this medicine? Your condition will be monitored  carefully while you are receiving this medicine. Visit your prescriber or health care professional for regular checks on your progress and for the needed blood tests. It is important to keep all appointments. What side effects may I notice from receiving this medicine? Side effects that you should report to your doctor or health care professional as soon as possible: -allergic reactions like skin rash, itching or hives, swelling of the face, lips, or tongue -shortness of breath, chest pain, swelling in a leg -unusual bleeding or bruising Side effects that usually do not require medical attention (report to your doctor or health care professional if they continue or are bothersome): -dizziness -headache -muscle aches -pain in arms and legs -stomach pain -trouble sleeping This list may not describe all possible side effects. Call your doctor for medical advice about side effects. You may report side effects to FDA at 1-800-FDA-1088. Where should I keep my medicine? This drug is given in a hospital or clinic and will not be stored at home. NOTE: This sheet is a summary. It may not cover all possible information. If you have questions about this medicine, talk to your doctor, pharmacist, or health care provider.    2016, Elsevier/Gold Standard. (2007-10-12 15:13:04) Cyanocobalamin, Vitamin B12 injection What is this medicine? CYANOCOBALAMIN (sye an oh koe BAL a min) is a man made form of vitamin B12. Vitamin B12 is used in the growth of healthy blood cells, nerve cells, and proteins in the body. It also helps with the metabolism of fats and carbohydrates. This medicine is used to treat people who can not absorb vitamin B12. This medicine may be used for other   purposes; ask your health care provider or pharmacist if you have questions. What should I tell my health care provider before I take this medicine? They need to know if you have any of these conditions: -kidney disease -Leber's  disease -megaloblastic anemia -an unusual or allergic reaction to cyanocobalamin, cobalt, other medicines, foods, dyes, or preservatives -pregnant or trying to get pregnant -breast-feeding How should I use this medicine? This medicine is injected into a muscle or deeply under the skin. It is usually given by a health care professional in a clinic or doctor's office. However, your doctor may teach you how to inject yourself. Follow all instructions. Talk to your pediatrician regarding the use of this medicine in children. Special care may be needed. Overdosage: If you think you have taken too much of this medicine contact a poison control center or emergency room at once. NOTE: This medicine is only for you. Do not share this medicine with others. What if I miss a dose? If you are given your dose at a clinic or doctor's office, call to reschedule your appointment. If you give your own injections and you miss a dose, take it as soon as you can. If it is almost time for your next dose, take only that dose. Do not take double or extra doses. What may interact with this medicine? -colchicine -heavy alcohol intake This list may not describe all possible interactions. Give your health care provider a list of all the medicines, herbs, non-prescription drugs, or dietary supplements you use. Also tell them if you smoke, drink alcohol, or use illegal drugs. Some items may interact with your medicine. What should I watch for while using this medicine? Visit your doctor or health care professional regularly. You may need blood work done while you are taking this medicine. You may need to follow a special diet. Talk to your doctor. Limit your alcohol intake and avoid smoking to get the best benefit. What side effects may I notice from receiving this medicine? Side effects that you should report to your doctor or health care professional as soon as possible: -allergic reactions like skin rash, itching or  hives, swelling of the face, lips, or tongue -blue tint to skin -chest tightness, pain -difficulty breathing, wheezing -dizziness -red, swollen painful area on the leg Side effects that usually do not require medical attention (report to your doctor or health care professional if they continue or are bothersome): -diarrhea -headache This list may not describe all possible side effects. Call your doctor for medical advice about side effects. You may report side effects to FDA at 1-800-FDA-1088. Where should I keep my medicine? Keep out of the reach of children. Store at room temperature between 15 and 30 degrees C (59 and 85 degrees F). Protect from light. Throw away any unused medicine after the expiration date. NOTE: This sheet is a summary. It may not cover all possible information. If you have questions about this medicine, talk to your doctor, pharmacist, or health care provider.    2016, Elsevier/Gold Standard. (2007-05-25 22:10:20)  

## 2015-09-11 DIAGNOSIS — E1140 Type 2 diabetes mellitus with diabetic neuropathy, unspecified: Secondary | ICD-10-CM | POA: Diagnosis not present

## 2015-09-11 DIAGNOSIS — M1991 Primary osteoarthritis, unspecified site: Secondary | ICD-10-CM | POA: Diagnosis not present

## 2015-09-12 ENCOUNTER — Ambulatory Visit (HOSPITAL_BASED_OUTPATIENT_CLINIC_OR_DEPARTMENT_OTHER): Payer: Medicare Other

## 2015-09-12 VITALS — BP 158/50 | HR 58 | Temp 97.9°F | Resp 22

## 2015-09-12 DIAGNOSIS — D693 Immune thrombocytopenic purpura: Secondary | ICD-10-CM

## 2015-09-12 MED ORDER — ROMIPLOSTIM 250 MCG ~~LOC~~ SOLR
50.0000 ug | SUBCUTANEOUS | Status: DC
  Administered 2015-09-12: 50 ug via SUBCUTANEOUS
  Filled 2015-09-12: qty 0.1

## 2015-09-12 NOTE — Patient Instructions (Signed)
Romiplostim injection What is this medicine? ROMIPLOSTIM ([place] mi PLOE stim) helps your body make more platelets. This medicine is used to treat low platelets caused by chronic idiopathic thrombocytopenic purpura (ITP). This medicine may be used for other purposes; ask your health care provider or pharmacist if you have questions. What should I tell my health care provider before I take this medicine? They need to know if you have any of these conditions: -cancer or myelodysplastic syndrome -low blood counts, like low white cell, platelet, or red cell counts -take medicines that treat or prevent blood clots -an unusual or allergic reaction to romiplostim, mannitol, other medicines, foods, dyes, or preservatives -pregnant or trying to get pregnant -breast-feeding How should I use this medicine? This medicine is for injection under the skin. It is given by a health care professional in a hospital or clinic setting. A special MedGuide will be given to you before your injection. Read this information carefully each time. Talk to your pediatrician regarding the use of this medicine in children. Special care may be needed. Overdosage: If you think you have taken too much of this medicine contact a poison control center or emergency room at once. NOTE: This medicine is only for you. Do not share this medicine with others. What if I miss a dose? It is important not to miss your dose. Call your doctor or health care professional if you are unable to keep an appointment. What may interact with this medicine? Interactions are not expected. This list may not describe all possible interactions. Give your health care provider a list of all the medicines, herbs, non-prescription drugs, or dietary supplements you use. Also tell them if you smoke, drink alcohol, or use illegal drugs. Some items may interact with your medicine. What should I watch for while using this medicine? Your condition will be monitored  carefully while you are receiving this medicine. Visit your prescriber or health care professional for regular checks on your progress and for the needed blood tests. It is important to keep all appointments. What side effects may I notice from receiving this medicine? Side effects that you should report to your doctor or health care professional as soon as possible: -allergic reactions like skin rash, itching or hives, swelling of the face, lips, or tongue -shortness of breath, chest pain, swelling in a leg -unusual bleeding or bruising Side effects that usually do not require medical attention (report to your doctor or health care professional if they continue or are bothersome): -dizziness -headache -muscle aches -pain in arms and legs -stomach pain -trouble sleeping This list may not describe all possible side effects. Call your doctor for medical advice about side effects. You may report side effects to FDA at 1-800-FDA-1088. Where should I keep my medicine? This drug is given in a hospital or clinic and will not be stored at home. NOTE: This sheet is a summary. It may not cover all possible information. If you have questions about this medicine, talk to your doctor, pharmacist, or health care provider.    2016, Elsevier/Gold Standard. (2007-10-12 15:13:04) Cyanocobalamin, Vitamin B12 injection What is this medicine? CYANOCOBALAMIN (sye an oh koe BAL a min) is a man made form of vitamin B12. Vitamin B12 is used in the growth of healthy blood cells, nerve cells, and proteins in the body. It also helps with the metabolism of fats and carbohydrates. This medicine is used to treat people who can not absorb vitamin B12. This medicine may be used for other   purposes; ask your health care provider or pharmacist if you have questions. What should I tell my health care provider before I take this medicine? They need to know if you have any of these conditions: -kidney disease -Leber's  disease -megaloblastic anemia -an unusual or allergic reaction to cyanocobalamin, cobalt, other medicines, foods, dyes, or preservatives -pregnant or trying to get pregnant -breast-feeding How should I use this medicine? This medicine is injected into a muscle or deeply under the skin. It is usually given by a health care professional in a clinic or doctor's office. However, your doctor may teach you how to inject yourself. Follow all instructions. Talk to your pediatrician regarding the use of this medicine in children. Special care may be needed. Overdosage: If you think you have taken too much of this medicine contact a poison control center or emergency room at once. NOTE: This medicine is only for you. Do not share this medicine with others. What if I miss a dose? If you are given your dose at a clinic or doctor's office, call to reschedule your appointment. If you give your own injections and you miss a dose, take it as soon as you can. If it is almost time for your next dose, take only that dose. Do not take double or extra doses. What may interact with this medicine? -colchicine -heavy alcohol intake This list may not describe all possible interactions. Give your health care provider a list of all the medicines, herbs, non-prescription drugs, or dietary supplements you use. Also tell them if you smoke, drink alcohol, or use illegal drugs. Some items may interact with your medicine. What should I watch for while using this medicine? Visit your doctor or health care professional regularly. You may need blood work done while you are taking this medicine. You may need to follow a special diet. Talk to your doctor. Limit your alcohol intake and avoid smoking to get the best benefit. What side effects may I notice from receiving this medicine? Side effects that you should report to your doctor or health care professional as soon as possible: -allergic reactions like skin rash, itching or  hives, swelling of the face, lips, or tongue -blue tint to skin -chest tightness, pain -difficulty breathing, wheezing -dizziness -red, swollen painful area on the leg Side effects that usually do not require medical attention (report to your doctor or health care professional if they continue or are bothersome): -diarrhea -headache This list may not describe all possible side effects. Call your doctor for medical advice about side effects. You may report side effects to FDA at 1-800-FDA-1088. Where should I keep my medicine? Keep out of the reach of children. Store at room temperature between 15 and 30 degrees C (59 and 85 degrees F). Protect from light. Throw away any unused medicine after the expiration date. NOTE: This sheet is a summary. It may not cover all possible information. If you have questions about this medicine, talk to your doctor, pharmacist, or health care provider.    2016, Elsevier/Gold Standard. (2007-05-25 22:10:20)  

## 2015-09-14 DIAGNOSIS — M1991 Primary osteoarthritis, unspecified site: Secondary | ICD-10-CM | POA: Diagnosis not present

## 2015-09-14 DIAGNOSIS — E1140 Type 2 diabetes mellitus with diabetic neuropathy, unspecified: Secondary | ICD-10-CM | POA: Diagnosis not present

## 2015-09-14 DIAGNOSIS — R351 Nocturia: Secondary | ICD-10-CM | POA: Diagnosis not present

## 2015-09-14 DIAGNOSIS — N401 Enlarged prostate with lower urinary tract symptoms: Secondary | ICD-10-CM | POA: Diagnosis not present

## 2015-09-19 ENCOUNTER — Encounter: Payer: Self-pay | Admitting: Hematology

## 2015-09-19 ENCOUNTER — Ambulatory Visit (HOSPITAL_BASED_OUTPATIENT_CLINIC_OR_DEPARTMENT_OTHER): Payer: Medicare Other | Admitting: Hematology

## 2015-09-19 ENCOUNTER — Ambulatory Visit (HOSPITAL_BASED_OUTPATIENT_CLINIC_OR_DEPARTMENT_OTHER): Payer: Medicare Other

## 2015-09-19 ENCOUNTER — Telehealth: Payer: Self-pay | Admitting: Hematology

## 2015-09-19 ENCOUNTER — Ambulatory Visit: Payer: Medicare Other | Admitting: Hematology

## 2015-09-19 ENCOUNTER — Other Ambulatory Visit (HOSPITAL_BASED_OUTPATIENT_CLINIC_OR_DEPARTMENT_OTHER): Payer: Medicare Other

## 2015-09-19 ENCOUNTER — Other Ambulatory Visit: Payer: Self-pay | Admitting: *Deleted

## 2015-09-19 VITALS — BP 159/49 | HR 49 | Temp 97.6°F | Resp 22 | Ht 68.0 in | Wt 222.6 lb

## 2015-09-19 DIAGNOSIS — R001 Bradycardia, unspecified: Secondary | ICD-10-CM | POA: Diagnosis not present

## 2015-09-19 DIAGNOSIS — D693 Immune thrombocytopenic purpura: Secondary | ICD-10-CM | POA: Diagnosis not present

## 2015-09-19 DIAGNOSIS — N189 Chronic kidney disease, unspecified: Secondary | ICD-10-CM

## 2015-09-19 DIAGNOSIS — I2510 Atherosclerotic heart disease of native coronary artery without angina pectoris: Secondary | ICD-10-CM | POA: Diagnosis not present

## 2015-09-19 LAB — CBC & DIFF AND RETIC
BASO%: 0.2 % (ref 0.0–2.0)
Basophils Absolute: 0 10e3/uL (ref 0.0–0.1)
EOS%: 1.4 % (ref 0.0–7.0)
Eosinophils Absolute: 0.1 10e3/uL (ref 0.0–0.5)
HCT: 35.5 % — ABNORMAL LOW (ref 38.4–49.9)
HGB: 11.8 g/dL — ABNORMAL LOW (ref 13.0–17.1)
Immature Retic Fract: 13.4 % — ABNORMAL HIGH (ref 3.00–10.60)
LYMPH%: 28.9 % (ref 14.0–49.0)
MCH: 32.5 pg (ref 27.2–33.4)
MCHC: 33.2 g/dL (ref 32.0–36.0)
MCV: 97.8 fL (ref 79.3–98.0)
MONO#: 0.5 10e3/uL (ref 0.1–0.9)
MONO%: 11.6 % (ref 0.0–14.0)
NEUT#: 2.4 10e3/uL (ref 1.5–6.5)
NEUT%: 57.9 % (ref 39.0–75.0)
Platelets: 152 10e3/uL (ref 140–400)
RBC: 3.63 10e6/uL — ABNORMAL LOW (ref 4.20–5.82)
RDW: 13 % (ref 11.0–14.6)
Retic %: 1.84 % — ABNORMAL HIGH (ref 0.80–1.80)
Retic Ct Abs: 66.79 10e3/uL (ref 34.80–93.90)
WBC: 4.2 10e3/uL (ref 4.0–10.3)
lymph#: 1.2 10e3/uL (ref 0.9–3.3)

## 2015-09-19 LAB — COMPREHENSIVE METABOLIC PANEL
ALK PHOS: 63 U/L (ref 40–150)
ALT: 20 U/L (ref 0–55)
ANION GAP: 8 meq/L (ref 3–11)
AST: 22 U/L (ref 5–34)
Albumin: 3.3 g/dL — ABNORMAL LOW (ref 3.5–5.0)
BILIRUBIN TOTAL: 0.52 mg/dL (ref 0.20–1.20)
BUN: 17.1 mg/dL (ref 7.0–26.0)
CO2: 25 meq/L (ref 22–29)
Calcium: 9 mg/dL (ref 8.4–10.4)
Chloride: 109 mEq/L (ref 98–109)
Creatinine: 1.6 mg/dL — ABNORMAL HIGH (ref 0.7–1.3)
EGFR: 37 mL/min/{1.73_m2} — AB (ref >90)
Glucose: 165 mg/dl — ABNORMAL HIGH (ref 70–140)
Potassium: 4.9 mEq/L (ref 3.5–5.1)
SODIUM: 142 meq/L (ref 136–145)
TOTAL PROTEIN: 6.3 g/dL — AB (ref 6.4–8.3)

## 2015-09-19 MED ORDER — ROMIPLOSTIM 250 MCG ~~LOC~~ SOLR
50.0000 ug | SUBCUTANEOUS | Status: DC
Start: 2015-09-19 — End: 2015-09-19
  Administered 2015-09-19: 50 ug via SUBCUTANEOUS
  Filled 2015-09-19: qty 0.1

## 2015-09-19 NOTE — Patient Instructions (Signed)
Romiplostim injection What is this medicine? ROMIPLOSTIM ([place] mi PLOE stim) helps your body make more platelets. This medicine is used to treat low platelets caused by chronic idiopathic thrombocytopenic purpura (ITP). This medicine may be used for other purposes; ask your health care provider or pharmacist if you have questions. What should I tell my health care provider before I take this medicine? They need to know if you have any of these conditions: -cancer or myelodysplastic syndrome -low blood counts, like low white cell, platelet, or red cell counts -take medicines that treat or prevent blood clots -an unusual or allergic reaction to romiplostim, mannitol, other medicines, foods, dyes, or preservatives -pregnant or trying to get pregnant -breast-feeding How should I use this medicine? This medicine is for injection under the skin. It is given by a health care professional in a hospital or clinic setting. A special MedGuide will be given to you before your injection. Read this information carefully each time. Talk to your pediatrician regarding the use of this medicine in children. Special care may be needed. Overdosage: If you think you have taken too much of this medicine contact a poison control center or emergency room at once. NOTE: This medicine is only for you. Do not share this medicine with others. What if I miss a dose? It is important not to miss your dose. Call your doctor or health care professional if you are unable to keep an appointment. What may interact with this medicine? Interactions are not expected. This list may not describe all possible interactions. Give your health care provider a list of all the medicines, herbs, non-prescription drugs, or dietary supplements you use. Also tell them if you smoke, drink alcohol, or use illegal drugs. Some items may interact with your medicine. What should I watch for while using this medicine? Your condition will be monitored  carefully while you are receiving this medicine. Visit your prescriber or health care professional for regular checks on your progress and for the needed blood tests. It is important to keep all appointments. What side effects may I notice from receiving this medicine? Side effects that you should report to your doctor or health care professional as soon as possible: -allergic reactions like skin rash, itching or hives, swelling of the face, lips, or tongue -shortness of breath, chest pain, swelling in a leg -unusual bleeding or bruising Side effects that usually do not require medical attention (report to your doctor or health care professional if they continue or are bothersome): -dizziness -headache -muscle aches -pain in arms and legs -stomach pain -trouble sleeping This list may not describe all possible side effects. Call your doctor for medical advice about side effects. You may report side effects to FDA at 1-800-FDA-1088. Where should I keep my medicine? This drug is given in a hospital or clinic and will not be stored at home. NOTE: This sheet is a summary. It may not cover all possible information. If you have questions about this medicine, talk to your doctor, pharmacist, or health care provider.    2016, Elsevier/Gold Standard. (2007-10-12 15:13:04) Cyanocobalamin, Vitamin B12 injection What is this medicine? CYANOCOBALAMIN (sye an oh koe BAL a min) is a man made form of vitamin B12. Vitamin B12 is used in the growth of healthy blood cells, nerve cells, and proteins in the body. It also helps with the metabolism of fats and carbohydrates. This medicine is used to treat people who can not absorb vitamin B12. This medicine may be used for other   purposes; ask your health care provider or pharmacist if you have questions. What should I tell my health care provider before I take this medicine? They need to know if you have any of these conditions: -kidney disease -Leber's  disease -megaloblastic anemia -an unusual or allergic reaction to cyanocobalamin, cobalt, other medicines, foods, dyes, or preservatives -pregnant or trying to get pregnant -breast-feeding How should I use this medicine? This medicine is injected into a muscle or deeply under the skin. It is usually given by a health care professional in a clinic or doctor's office. However, your doctor may teach you how to inject yourself. Follow all instructions. Talk to your pediatrician regarding the use of this medicine in children. Special care may be needed. Overdosage: If you think you have taken too much of this medicine contact a poison control center or emergency room at once. NOTE: This medicine is only for you. Do not share this medicine with others. What if I miss a dose? If you are given your dose at a clinic or doctor's office, call to reschedule your appointment. If you give your own injections and you miss a dose, take it as soon as you can. If it is almost time for your next dose, take only that dose. Do not take double or extra doses. What may interact with this medicine? -colchicine -heavy alcohol intake This list may not describe all possible interactions. Give your health care provider a list of all the medicines, herbs, non-prescription drugs, or dietary supplements you use. Also tell them if you smoke, drink alcohol, or use illegal drugs. Some items may interact with your medicine. What should I watch for while using this medicine? Visit your doctor or health care professional regularly. You may need blood work done while you are taking this medicine. You may need to follow a special diet. Talk to your doctor. Limit your alcohol intake and avoid smoking to get the best benefit. What side effects may I notice from receiving this medicine? Side effects that you should report to your doctor or health care professional as soon as possible: -allergic reactions like skin rash, itching or  hives, swelling of the face, lips, or tongue -blue tint to skin -chest tightness, pain -difficulty breathing, wheezing -dizziness -red, swollen painful area on the leg Side effects that usually do not require medical attention (report to your doctor or health care professional if they continue or are bothersome): -diarrhea -headache This list may not describe all possible side effects. Call your doctor for medical advice about side effects. You may report side effects to FDA at 1-800-FDA-1088. Where should I keep my medicine? Keep out of the reach of children. Store at room temperature between 15 and 30 degrees C (59 and 85 degrees F). Protect from light. Throw away any unused medicine after the expiration date. NOTE: This sheet is a summary. It may not cover all possible information. If you have questions about this medicine, talk to your doctor, pharmacist, or health care provider.    2016, Elsevier/Gold Standard. (2007-05-25 22:10:20)  

## 2015-09-19 NOTE — Progress Notes (Signed)
.    Hematology oncology clinic note  Date of service: 09/19/2015  Patient Care Team: Hoyt Koch, MD as PCP - General (Internal Medicine)  CHIEF COMPLAINTS/PURPOSE OF CONSULTATION: Follow-up for ITP   Diagnosis: Idiopathic thrombocytopenic purpura   Current treatment: Nplate weekly to maintain reasonable platelet counts >50K. Requirement have ranged from 21mcg/kg to 79mcg/kg. Recently stable counts with 0.5-2 mcg/kg  Previous treatment: Steroids, IVIG.  HISTORY OF PRESENTING ILLNESS:  please see my previous clinic note from 09/28/2014 for details of initial presentation and course of treatment.  Interval history  Patient is here for follow-up regarding his ITP.  Has not noted any bleeding issues. He missed all one dose of his Nplate around the Fourth of July weekend .  Notes that he had a good July 4 and was in [place] with his son. Has been feeling well and eating well with no acute new symptoms. Primary care physician managing his other comorbidities. No other acute new symptoms. No chest pain no shortness of breath no abdominal pain. Minimal pedal edema.   MEDICAL HISTORY:  Past Medical History:  Diagnosis Date  . BPH (benign prostatic hypertrophy)   . Chronic renal insufficiency   . Coronary artery disease    CABG in 2000  . Diabetes type 2, controlled ([place])    Diet-controlled  . Diabetic neuropathy ([place])   . Dyslipidemia   . Hypertension   . ITP (idiopathic thrombocytopenic purpura)   . Osteoarthritis     SURGICAL HISTORY: Past Surgical History:  Procedure Laterality Date  . CHOLECYSTECTOMY    . CORONARY ARTERY BYPASS GRAFT  2000  . Right hip replacement  2014  . TONSILLECTOMY      SOCIAL HISTORY: Social History   Social History  . Marital status: Single    Spouse name: N/A  . Number of children: N/A  . Years of education: N/A   Occupational History  . Not on file.   Social History Main Topics  . Smoking status: Former Smoker   Packs/day: 2.00    Years: 25.00  . Smokeless tobacco: Never Used  . Alcohol use No  . Drug use: No  . Sexual activity: Not Currently   Other Topics Concern  . Not on file   Social History Narrative  . No narrative on file   He is living in [place] which is an assisted living facility. One of his son is also moving to [facility] and has a Designer, jewellery in religious studies. Patient notes that his other son is a Systems analyst with multiple awards.  FAMILY HISTORY: Family History  Problem Relation Age of Onset  . Family history unknown: Yes    ALLERGIES:  is allergic to penicillins.  MEDICATIONS:  Current Outpatient Prescriptions  Medication Sig Dispense Refill  . acetaminophen (TYLENOL) 500 MG tablet Take 250 mg by mouth 2 (two) times daily.     . Ascorbic Acid (VITAMIN C) 100 MG tablet Take 100 mg by mouth daily.    . Cholecalciferol (VITAMIN D-1000 MAX ST) 1000 UNITS tablet Take 1 tablet (1,000 Units total) by mouth daily.    . furosemide (LASIX) 40 MG tablet Take 1 tablet (40 mg total) by mouth daily. 30 tablet 0  . losartan (COZAAR) 100 MG tablet Take 100 mg by mouth daily. Reported on 05/04/2015    . magnesium chloride (SLOW-MAG) 64 MG TBEC SR tablet Take 1 tablet (64 mg total) by mouth daily. 60 tablet 1  . metoprolol tartrate (LOPRESSOR) 25 MG  tablet Take 6.25 mg by mouth 2 (two) times daily.     Marland Kitchen omeprazole (PRILOSEC) 20 MG capsule Take 20 mg by mouth daily.    . simvastatin (ZOCOR) 20 MG tablet Take 20 mg by mouth daily.     No current facility-administered medications for this visit.    Facility-Administered Medications Ordered in Other Visits  Medication Dose Route Frequency Provider Last Rate Last Dose  . romiPLOStim (NPLATE) injection 200 mcg  2 mcg/kg Subcutaneous Weekly Arbadella Kimbler Juleen China, MD       Romiplostim 5 mcg/kg weekly   REVIEW OF SYSTEMS:   10 point review of system is negative except as noted above  PHYSICAL EXAMINATION: ECOG  PERFORMANCE STATUS: 1 - Symptomatic but completely ambulatory  Vitals:   09/19/15 0954  BP: (!) 159/49  Pulse: (!) 49  Resp: (!) 22  Temp: 97.6 F (36.4 C)   Filed Weights   09/19/15 0954  Weight: 222 lb 9.6 oz (101 kg)   GENERAL: Elderly gentleman in no acute distress:alert and comfortable SKIN: skin color, texture, turgor are normal, no rashes or significant lesions EYES: normal, conjunctiva are pink and non-injected, sclera clear OROPHARYNX:no exudate, no erythema and lips, buccal mucosa, and tongue normal  NECK: supple, thyroid normal size, non-tender, without nodularity LYMPH:  no palpable lymphadenopathy in the cervical, axillary or inguinal LUNGS: Air entry bilaterally equal, No rales no rhonchi HEART: regular rate & rhythm and 2 x 6 systolic murmur over aortic area, and no lower extremity edema ABDOMEN:abdomen soft, non-tender and normal bowel sounds PSYCH: alert & oriented x 3 with fluent speech NEURO: no focal motor/sensory deficits  LABORATORY DATA:   . CBC Latest Ref Rng & Units 09/19/2015 09/05/2015 08/22/2015  WBC 4.0 - 10.3 10e3/uL 4.2 5.6 6.8  Hemoglobin 13.0 - 17.1 g/dL 11.8(L) 12.0(L) 12.3(L)  Hematocrit 38.4 - 49.9 % 35.5(L) 36.1(L) 36.9(L)  Platelets 140 - 400 10e3/uL 152 102(L) 149    . CMP Latest Ref Rng & Units 09/19/2015 09/05/2015 08/08/2015  Glucose 70 - 140 mg/dl 165(H) 158(H) 157(H)  BUN 7.0 - 26.0 mg/dL 17.1 23.6 23.5  Creatinine 0.7 - 1.3 mg/dL 1.6(H) 1.5(H) 1.6(H)  Sodium 136 - 145 mEq/L 142 140 138  Potassium 3.5 - 5.1 mEq/L 4.9 4.6 4.5  Chloride 101 - 111 mmol/L - - -  CO2 22 - 29 mEq/L 25 25 23   Calcium 8.4 - 10.4 mg/dL 9.0 9.2 9.3  Total Protein 6.4 - 8.3 g/dL 6.3(L) 6.5 6.6  Total Bilirubin 0.20 - 1.20 mg/dL 0.52 0.53 0.53  Alkaline Phos 40 - 150 U/L 63 68 60  AST 5 - 34 U/L 22 21 21   ALT 0 - 55 U/L 20 20 20     ASSESSMENT & PLAN:   82 year old Caucasian male with multiple medical comorbidities with  #1 chronic ITP since 1989. Has  previously been responsive to steroids and has received IVIG on one occasion. He has been maintained on 3-72mcg/kg weekly of Nplate since mid [id number] initially with Dr. Arvin Collard at [facility] and then with Dr. Jimmie Molly at [facility] in [place].  No issues with bleeding . Ultrasound abdomen showed normal spleen size SPEP- no obvious monoclonal protein. IFE showed possibility of an restrictive bands in the IgG and Lambda lanes. Patient's platelet counts have remained remarkably stable at Romiplostim doses of 70mcg/kg and was subsequently cut down to 0.5 mcg/kg and platelets have remained stable with this dose as well.  Plan  -continue Nplatelet 0.5-69mcg/kg qweekly and adjust  per treatment parameters. -every 2 weeks CBCs given platelet stability and every 4 weekly CMP  #2  Coronary artery disease status post CABG in year 2000. Not on aspirin due to his ITP . BNP was elevated. ECHO today shows mildly reduced systolic function and LVH  Patient notes his breathing has improved and his leg swelling is down. #3 chronic kidney disease - creatinine overall stable today @ 1.4. Potassium today 5 #4 mild bradycardia- heartrate was increasing appropriately with walking and no hypotension. No symptoms. Likely related to his beta blocker. Plan -low salt intake -Monitor for symptomatic bradycardia - We'll defer further management for these to Dr. Sharlet Salina.  Return to clinic with Dr Irene Limbo in 2 months Continue weekly Nplate and every 2 weekly CBCs  Total time spent 15 minutes more than 50% time on direct patient contact counseling and coordination of care.    Sullivan Lone MD [place] Hematology/Oncology Physician [facility]  (Office):       [phone] (Work cell):  [phone] (Fax):           [phone]

## 2015-09-19 NOTE — Telephone Encounter (Signed)
Gave patient avs report and appointments for August and September  °

## 2015-09-21 DIAGNOSIS — E1140 Type 2 diabetes mellitus with diabetic neuropathy, unspecified: Secondary | ICD-10-CM | POA: Diagnosis not present

## 2015-09-21 DIAGNOSIS — M1991 Primary osteoarthritis, unspecified site: Secondary | ICD-10-CM | POA: Diagnosis not present

## 2015-09-22 DIAGNOSIS — M1991 Primary osteoarthritis, unspecified site: Secondary | ICD-10-CM | POA: Diagnosis not present

## 2015-09-22 DIAGNOSIS — E1140 Type 2 diabetes mellitus with diabetic neuropathy, unspecified: Secondary | ICD-10-CM | POA: Diagnosis not present

## 2015-09-26 ENCOUNTER — Ambulatory Visit (HOSPITAL_BASED_OUTPATIENT_CLINIC_OR_DEPARTMENT_OTHER): Payer: Medicare Other

## 2015-09-26 ENCOUNTER — Telehealth: Payer: Self-pay | Admitting: Hematology

## 2015-09-26 VITALS — BP 170/56 | HR 52 | Temp 98.1°F | Resp 22

## 2015-09-26 DIAGNOSIS — D693 Immune thrombocytopenic purpura: Secondary | ICD-10-CM

## 2015-09-26 MED ORDER — ROMIPLOSTIM 250 MCG ~~LOC~~ SOLR
50.0000 ug | SUBCUTANEOUS | Status: DC
  Administered 2015-09-26: 50 ug via SUBCUTANEOUS
  Filled 2015-09-26: qty 0.1

## 2015-09-26 NOTE — Telephone Encounter (Signed)
per pof to sch pt appt-pt req to chge several due to transportation-gave pt updated copy

## 2015-09-26 NOTE — Patient Instructions (Signed)
Romiplostim injection What is this medicine? ROMIPLOSTIM ([place] mi PLOE stim) helps your body make more platelets. This medicine is used to treat low platelets caused by chronic idiopathic thrombocytopenic purpura (ITP). This medicine may be used for other purposes; ask your health care provider or pharmacist if you have questions. What should I tell my health care provider before I take this medicine? They need to know if you have any of these conditions: -cancer or myelodysplastic syndrome -low blood counts, like low white cell, platelet, or red cell counts -take medicines that treat or prevent blood clots -an unusual or allergic reaction to romiplostim, mannitol, other medicines, foods, dyes, or preservatives -pregnant or trying to get pregnant -breast-feeding How should I use this medicine? This medicine is for injection under the skin. It is given by a health care professional in a hospital or clinic setting. A special MedGuide will be given to you before your injection. Read this information carefully each time. Talk to your pediatrician regarding the use of this medicine in children. Special care may be needed. Overdosage: If you think you have taken too much of this medicine contact a poison control center or emergency room at once. NOTE: This medicine is only for you. Do not share this medicine with others. What if I miss a dose? It is important not to miss your dose. Call your doctor or health care professional if you are unable to keep an appointment. What may interact with this medicine? Interactions are not expected. This list may not describe all possible interactions. Give your health care provider a list of all the medicines, herbs, non-prescription drugs, or dietary supplements you use. Also tell them if you smoke, drink alcohol, or use illegal drugs. Some items may interact with your medicine. What should I watch for while using this medicine? Your condition will be monitored  carefully while you are receiving this medicine. Visit your prescriber or health care professional for regular checks on your progress and for the needed blood tests. It is important to keep all appointments. What side effects may I notice from receiving this medicine? Side effects that you should report to your doctor or health care professional as soon as possible: -allergic reactions like skin rash, itching or hives, swelling of the face, lips, or tongue -shortness of breath, chest pain, swelling in a leg -unusual bleeding or bruising Side effects that usually do not require medical attention (report to your doctor or health care professional if they continue or are bothersome): -dizziness -headache -muscle aches -pain in arms and legs -stomach pain -trouble sleeping This list may not describe all possible side effects. Call your doctor for medical advice about side effects. You may report side effects to FDA at 1-800-FDA-1088. Where should I keep my medicine? This drug is given in a hospital or clinic and will not be stored at home. NOTE: This sheet is a summary. It may not cover all possible information. If you have questions about this medicine, talk to your doctor, pharmacist, or health care provider.    2016, Elsevier/Gold Standard. (2007-10-12 15:13:04) Cyanocobalamin, Vitamin B12 injection What is this medicine? CYANOCOBALAMIN (sye an oh koe BAL a min) is a man made form of vitamin B12. Vitamin B12 is used in the growth of healthy blood cells, nerve cells, and proteins in the body. It also helps with the metabolism of fats and carbohydrates. This medicine is used to treat people who can not absorb vitamin B12. This medicine may be used for other   purposes; ask your health care provider or pharmacist if you have questions. What should I tell my health care provider before I take this medicine? They need to know if you have any of these conditions: -kidney disease -Leber's  disease -megaloblastic anemia -an unusual or allergic reaction to cyanocobalamin, cobalt, other medicines, foods, dyes, or preservatives -pregnant or trying to get pregnant -breast-feeding How should I use this medicine? This medicine is injected into a muscle or deeply under the skin. It is usually given by a health care professional in a clinic or doctor's office. However, your doctor may teach you how to inject yourself. Follow all instructions. Talk to your pediatrician regarding the use of this medicine in children. Special care may be needed. Overdosage: If you think you have taken too much of this medicine contact a poison control center or emergency room at once. NOTE: This medicine is only for you. Do not share this medicine with others. What if I miss a dose? If you are given your dose at a clinic or doctor's office, call to reschedule your appointment. If you give your own injections and you miss a dose, take it as soon as you can. If it is almost time for your next dose, take only that dose. Do not take double or extra doses. What may interact with this medicine? -colchicine -heavy alcohol intake This list may not describe all possible interactions. Give your health care provider a list of all the medicines, herbs, non-prescription drugs, or dietary supplements you use. Also tell them if you smoke, drink alcohol, or use illegal drugs. Some items may interact with your medicine. What should I watch for while using this medicine? Visit your doctor or health care professional regularly. You may need blood work done while you are taking this medicine. You may need to follow a special diet. Talk to your doctor. Limit your alcohol intake and avoid smoking to get the best benefit. What side effects may I notice from receiving this medicine? Side effects that you should report to your doctor or health care professional as soon as possible: -allergic reactions like skin rash, itching or  hives, swelling of the face, lips, or tongue -blue tint to skin -chest tightness, pain -difficulty breathing, wheezing -dizziness -red, swollen painful area on the leg Side effects that usually do not require medical attention (report to your doctor or health care professional if they continue or are bothersome): -diarrhea -headache This list may not describe all possible side effects. Call your doctor for medical advice about side effects. You may report side effects to FDA at 1-800-FDA-1088. Where should I keep my medicine? Keep out of the reach of children. Store at room temperature between 15 and 30 degrees C (59 and 85 degrees F). Protect from light. Throw away any unused medicine after the expiration date. NOTE: This sheet is a summary. It may not cover all possible information. If you have questions about this medicine, talk to your doctor, pharmacist, or health care provider.    2016, Elsevier/Gold Standard. (2007-05-25 22:10:20)  

## 2015-09-27 ENCOUNTER — Telehealth: Payer: Self-pay | Admitting: Emergency Medicine

## 2015-09-27 DIAGNOSIS — E1140 Type 2 diabetes mellitus with diabetic neuropathy, unspecified: Secondary | ICD-10-CM | POA: Diagnosis not present

## 2015-09-27 DIAGNOSIS — M1991 Primary osteoarthritis, unspecified site: Secondary | ICD-10-CM | POA: Diagnosis not present

## 2015-09-27 NOTE — Telephone Encounter (Signed)
Pt is having cramping across his chest when laying flat and oxygen level is dropping to 92-93. Heart rate at rest is dipping into the 48 and 49. His oxygen level when he walks is about 88 percent. Would the doctor like the home health nurse to come out or would she like to see the patient. His feet are slightly swollen as well. Please follow up thanks.

## 2015-09-28 NOTE — Telephone Encounter (Signed)
Would recommend if he is still having the pain in his chest to go to ER for evaluation. If no chest pains recommend acute visit today or tomorrow at office.

## 2015-09-28 NOTE — Telephone Encounter (Signed)
Left message on Kate's voice mail informing her that patient needs to go to ED if chest pains continue, or come in for an acute visit today or tomorrow.

## 2015-09-29 DIAGNOSIS — E1140 Type 2 diabetes mellitus with diabetic neuropathy, unspecified: Secondary | ICD-10-CM | POA: Diagnosis not present

## 2015-09-29 DIAGNOSIS — M1991 Primary osteoarthritis, unspecified site: Secondary | ICD-10-CM | POA: Diagnosis not present

## 2015-10-03 ENCOUNTER — Other Ambulatory Visit (HOSPITAL_BASED_OUTPATIENT_CLINIC_OR_DEPARTMENT_OTHER): Payer: Medicare Other

## 2015-10-03 ENCOUNTER — Ambulatory Visit: Payer: Medicare Other

## 2015-10-03 ENCOUNTER — Other Ambulatory Visit: Payer: Medicare Other

## 2015-10-03 ENCOUNTER — Ambulatory Visit (HOSPITAL_BASED_OUTPATIENT_CLINIC_OR_DEPARTMENT_OTHER): Payer: Medicare Other

## 2015-10-03 VITALS — BP 165/64 | HR 55 | Temp 98.0°F | Resp 20

## 2015-10-03 DIAGNOSIS — D693 Immune thrombocytopenic purpura: Secondary | ICD-10-CM

## 2015-10-03 LAB — CBC WITH DIFFERENTIAL/PLATELET
BASO%: 0.2 % (ref 0.0–2.0)
Basophils Absolute: 0 10*3/uL (ref 0.0–0.1)
EOS ABS: 0.1 10*3/uL (ref 0.0–0.5)
EOS%: 1 % (ref 0.0–7.0)
HEMATOCRIT: 34.9 % — AB (ref 38.4–49.9)
HGB: 11.5 g/dL — ABNORMAL LOW (ref 13.0–17.1)
LYMPH%: 25.7 % (ref 14.0–49.0)
MCH: 32.1 pg (ref 27.2–33.4)
MCHC: 33 g/dL (ref 32.0–36.0)
MCV: 97.5 fL (ref 79.3–98.0)
MONO#: 0.5 10*3/uL (ref 0.1–0.9)
MONO%: 10.6 % (ref 0.0–14.0)
NEUT#: 3.1 10*3/uL (ref 1.5–6.5)
NEUT%: 62.5 % (ref 39.0–75.0)
PLATELETS: 122 10*3/uL — AB (ref 140–400)
RBC: 3.58 10*6/uL — ABNORMAL LOW (ref 4.20–5.82)
RDW: 13 % (ref 11.0–14.6)
WBC: 5 10*3/uL (ref 4.0–10.3)
lymph#: 1.3 10*3/uL (ref 0.9–3.3)
nRBC: 0 % (ref 0–0)

## 2015-10-03 MED ORDER — CYANOCOBALAMIN 1000 MCG/ML IJ SOLN
1000.0000 ug | INTRAMUSCULAR | Status: DC
  Administered 2015-10-03: 1000 ug via SUBCUTANEOUS

## 2015-10-03 MED ORDER — ROMIPLOSTIM 250 MCG ~~LOC~~ SOLR
1.0000 ug/kg | SUBCUTANEOUS | Status: DC
  Administered 2015-10-03: 100 ug via SUBCUTANEOUS
  Filled 2015-10-03: qty 0.2

## 2015-10-03 NOTE — Patient Instructions (Signed)
Romiplostim injection What is this medicine? ROMIPLOSTIM ([place] mi PLOE stim) helps your body make more platelets. This medicine is used to treat low platelets caused by chronic idiopathic thrombocytopenic purpura (ITP). This medicine may be used for other purposes; ask your health care provider or pharmacist if you have questions. What should I tell my health care provider before I take this medicine? They need to know if you have any of these conditions: -cancer or myelodysplastic syndrome -low blood counts, like low white cell, platelet, or red cell counts -take medicines that treat or prevent blood clots -an unusual or allergic reaction to romiplostim, mannitol, other medicines, foods, dyes, or preservatives -pregnant or trying to get pregnant -breast-feeding How should I use this medicine? This medicine is for injection under the skin. It is given by a health care professional in a hospital or clinic setting. A special MedGuide will be given to you before your injection. Read this information carefully each time. Talk to your pediatrician regarding the use of this medicine in children. Special care may be needed. Overdosage: If you think you have taken too much of this medicine contact a poison control center or emergency room at once. NOTE: This medicine is only for you. Do not share this medicine with others. What if I miss a dose? It is important not to miss your dose. Call your doctor or health care professional if you are unable to keep an appointment. What may interact with this medicine? Interactions are not expected. This list may not describe all possible interactions. Give your health care provider a list of all the medicines, herbs, non-prescription drugs, or dietary supplements you use. Also tell them if you smoke, drink alcohol, or use illegal drugs. Some items may interact with your medicine. What should I watch for while using this medicine? Your condition will be monitored  carefully while you are receiving this medicine. Visit your prescriber or health care professional for regular checks on your progress and for the needed blood tests. It is important to keep all appointments. What side effects may I notice from receiving this medicine? Side effects that you should report to your doctor or health care professional as soon as possible: -allergic reactions like skin rash, itching or hives, swelling of the face, lips, or tongue -shortness of breath, chest pain, swelling in a leg -unusual bleeding or bruising Side effects that usually do not require medical attention (report to your doctor or health care professional if they continue or are bothersome): -dizziness -headache -muscle aches -pain in arms and legs -stomach pain -trouble sleeping This list may not describe all possible side effects. Call your doctor for medical advice about side effects. You may report side effects to FDA at 1-800-FDA-1088. Where should I keep my medicine? This drug is given in a hospital or clinic and will not be stored at home. NOTE: This sheet is a summary. It may not cover all possible information. If you have questions about this medicine, talk to your doctor, pharmacist, or health care provider.    2016, Elsevier/Gold Standard. (2007-10-12 15:13:04) Cyanocobalamin, Vitamin B12 injection What is this medicine? CYANOCOBALAMIN (sye an oh koe BAL a min) is a man made form of vitamin B12. Vitamin B12 is used in the growth of healthy blood cells, nerve cells, and proteins in the body. It also helps with the metabolism of fats and carbohydrates. This medicine is used to treat people who can not absorb vitamin B12. This medicine may be used for other   purposes; ask your health care provider or pharmacist if you have questions. What should I tell my health care provider before I take this medicine? They need to know if you have any of these conditions: -kidney disease -Leber's  disease -megaloblastic anemia -an unusual or allergic reaction to cyanocobalamin, cobalt, other medicines, foods, dyes, or preservatives -pregnant or trying to get pregnant -breast-feeding How should I use this medicine? This medicine is injected into a muscle or deeply under the skin. It is usually given by a health care professional in a clinic or doctor's office. However, your doctor may teach you how to inject yourself. Follow all instructions. Talk to your pediatrician regarding the use of this medicine in children. Special care may be needed. Overdosage: If you think you have taken too much of this medicine contact a poison control center or emergency room at once. NOTE: This medicine is only for you. Do not share this medicine with others. What if I miss a dose? If you are given your dose at a clinic or doctor's office, call to reschedule your appointment. If you give your own injections and you miss a dose, take it as soon as you can. If it is almost time for your next dose, take only that dose. Do not take double or extra doses. What may interact with this medicine? -colchicine -heavy alcohol intake This list may not describe all possible interactions. Give your health care provider a list of all the medicines, herbs, non-prescription drugs, or dietary supplements you use. Also tell them if you smoke, drink alcohol, or use illegal drugs. Some items may interact with your medicine. What should I watch for while using this medicine? Visit your doctor or health care professional regularly. You may need blood work done while you are taking this medicine. You may need to follow a special diet. Talk to your doctor. Limit your alcohol intake and avoid smoking to get the best benefit. What side effects may I notice from receiving this medicine? Side effects that you should report to your doctor or health care professional as soon as possible: -allergic reactions like skin rash, itching or  hives, swelling of the face, lips, or tongue -blue tint to skin -chest tightness, pain -difficulty breathing, wheezing -dizziness -red, swollen painful area on the leg Side effects that usually do not require medical attention (report to your doctor or health care professional if they continue or are bothersome): -diarrhea -headache This list may not describe all possible side effects. Call your doctor for medical advice about side effects. You may report side effects to FDA at 1-800-FDA-1088. Where should I keep my medicine? Keep out of the reach of children. Store at room temperature between 15 and 30 degrees C (59 and 85 degrees F). Protect from light. Throw away any unused medicine after the expiration date. NOTE: This sheet is a summary. It may not cover all possible information. If you have questions about this medicine, talk to your doctor, pharmacist, or health care provider.    2016, Elsevier/Gold Standard. (2007-05-25 22:10:20)  

## 2015-10-05 ENCOUNTER — Emergency Department (HOSPITAL_COMMUNITY)
Admission: EM | Admit: 2015-10-05 | Discharge: 2015-10-05 | Disposition: A | Discharge status: Home / Self Care | Payer: Medicare Other | Attending: Emergency Medicine | Admitting: Emergency Medicine

## 2015-10-05 ENCOUNTER — Ambulatory Visit (INDEPENDENT_AMBULATORY_CARE_PROVIDER_SITE_OTHER): Payer: Medicare Other | Admitting: Internal Medicine

## 2015-10-05 ENCOUNTER — Encounter: Payer: Self-pay | Admitting: Internal Medicine

## 2015-10-05 ENCOUNTER — Ambulatory Visit: Payer: Medicare Other | Admitting: Internal Medicine

## 2015-10-05 ENCOUNTER — Other Ambulatory Visit: Payer: Self-pay

## 2015-10-05 ENCOUNTER — Encounter (HOSPITAL_COMMUNITY): Payer: Self-pay | Admitting: Emergency Medicine

## 2015-10-05 ENCOUNTER — Emergency Department (HOSPITAL_COMMUNITY): Payer: Medicare Other

## 2015-10-05 VITALS — BP 134/60 | HR 81 | Temp 97.6°F | Resp 20 | Ht 67.0 in | Wt 221.0 lb

## 2015-10-05 DIAGNOSIS — R0789 Other chest pain: Secondary | ICD-10-CM | POA: Diagnosis not present

## 2015-10-05 DIAGNOSIS — E119 Type 2 diabetes mellitus without complications: Secondary | ICD-10-CM | POA: Diagnosis not present

## 2015-10-05 DIAGNOSIS — R0600 Dyspnea, unspecified: Secondary | ICD-10-CM | POA: Diagnosis not present

## 2015-10-05 DIAGNOSIS — I2510 Atherosclerotic heart disease of native coronary artery without angina pectoris: Secondary | ICD-10-CM

## 2015-10-05 DIAGNOSIS — R0602 Shortness of breath: Secondary | ICD-10-CM | POA: Insufficient documentation

## 2015-10-05 DIAGNOSIS — Z87891 Personal history of nicotine dependence: Secondary | ICD-10-CM | POA: Insufficient documentation

## 2015-10-05 DIAGNOSIS — R072 Precordial pain: Secondary | ICD-10-CM | POA: Diagnosis not present

## 2015-10-05 DIAGNOSIS — R079 Chest pain, unspecified: Secondary | ICD-10-CM | POA: Insufficient documentation

## 2015-10-05 DIAGNOSIS — Z79899 Other long term (current) drug therapy: Secondary | ICD-10-CM | POA: Insufficient documentation

## 2015-10-05 DIAGNOSIS — I10 Essential (primary) hypertension: Secondary | ICD-10-CM | POA: Diagnosis not present

## 2015-10-05 LAB — I-STAT TROPONIN, ED
TROPONIN I, POC: 0.03 ng/mL (ref 0.00–0.08)
Troponin i, poc: 0.01 ng/mL (ref 0.00–0.08)
Troponin i, poc: 0.02 ng/mL (ref 0.00–0.08)

## 2015-10-05 LAB — BASIC METABOLIC PANEL
ANION GAP: 6 (ref 5–15)
BUN: 23 mg/dL — ABNORMAL HIGH (ref 6–20)
CALCIUM: 9.2 mg/dL (ref 8.9–10.3)
CO2: 26 mmol/L (ref 22–32)
CREATININE: 1.49 mg/dL — AB (ref 0.61–1.24)
Chloride: 106 mmol/L (ref 101–111)
GFR calc non Af Amer: 38 mL/min — ABNORMAL LOW (ref >60)
GFR, EST AFRICAN AMERICAN: 44 mL/min — AB (ref >60)
Glucose, Bld: 147 mg/dL — ABNORMAL HIGH (ref 65–99)
Potassium: 4.5 mmol/L (ref 3.5–5.1)
SODIUM: 138 mmol/L (ref 135–145)

## 2015-10-05 LAB — CBC
HCT: 35 % — ABNORMAL LOW (ref 39.0–52.0)
HEMOGLOBIN: 11.5 g/dL — AB (ref 13.0–17.0)
MCH: 32.1 pg (ref 26.0–34.0)
MCHC: 32.9 g/dL (ref 30.0–36.0)
MCV: 97.8 fL (ref 78.0–100.0)
PLATELETS: 140 10*3/uL — AB (ref 150–400)
RBC: 3.58 MIL/uL — AB (ref 4.22–5.81)
RDW: 13 % (ref 11.5–15.5)
WBC: 5.9 10*3/uL (ref 4.0–10.5)

## 2015-10-05 LAB — BRAIN NATRIURETIC PEPTIDE: B NATRIURETIC PEPTIDE 5: 278.4 pg/mL — AB (ref 0.0–100.0)

## 2015-10-05 MED ORDER — ASPIRIN 81 MG PO CHEW: 324.0000 mg | CHEWABLE_TABLET | Freq: Once | ORAL | Status: DC

## 2015-10-05 MED ORDER — ASPIRIN 81 MG PO CHEW
CHEWABLE_TABLET | ORAL | Status: AC
  Filled 2015-10-05: qty 4

## 2015-10-05 NOTE — Progress Notes (Signed)
Pre visit review using our clinic review tool, if applicable. No additional management support is needed unless otherwise documented below in the visit note. 

## 2015-10-05 NOTE — Progress Notes (Signed)
Gave patient 324 mg ASA at PCP's office this morning at 10:45. [place], [place]

## 2015-10-05 NOTE — ED Notes (Signed)
Patient transported to X-ray 

## 2015-10-05 NOTE — ED Triage Notes (Signed)
Pt sent from PCP for evaluation of chest pressure this morning. States he has been having angina and headaches for a few weeks now with SOB. Received a baby aspirin at PCP. Denies pain at the moment. A/O

## 2015-10-05 NOTE — Assessment & Plan Note (Addendum)
Given the EKG changes (unknown if these are new changes versus old from prior CABG) and the ongoing nature of the chest pain and SOB will have him go to the ER immediately and he agrees with plan. Given ASA 325 mg (4 of the 81 mg ASA) in the office. Not taking aspirin at home.

## 2015-10-05 NOTE — Progress Notes (Signed)
   Subjective:    Patient ID: Lawrence Warner, male    DOB: [birth year], 82 y.o.   MRN: [id number]  HPI The patient is a 82 YO man coming in for chest pains and SOB. The SOB has been present for about 15 years and is not significantly changed. He stopped taking his fluid pill some time ago as he thought it was not helping with fluid elimination. He has been having chest pains with rest and with activity. Hx CABG around 2000. Has not seen a cardiologist in a long time. He called the [place] about getting in but has not heard back from that. He does not have nitroglycerin at home. The chest pain lasts about 5 minutes and then fades. He has not sought care in the ED despite our recommendation to do so. He had an episode of mild chest pain in our office today which has passed now. No chest pain currently. Had a large episode of chest pain yesterday morning which lasted about 20 minutes and was more severe that the other episodes. Would like to see a local cardiologist. Some mild ankle swelling. Okay with breathing at night time but uses several pillows at baseline. Gets moderate to severe SOB with even limited exertion.   Review of Systems  Constitutional: Positive for fatigue. Negative for activity change, appetite change, diaphoresis, fever and unexpected weight change.  HENT: Negative.   Eyes: Negative.   Respiratory: Positive for shortness of breath. Negative for cough, chest tightness and wheezing.   Cardiovascular: Positive for chest pain and leg swelling. Negative for palpitations.  Gastrointestinal: Negative for abdominal distention, abdominal pain, constipation, diarrhea and nausea.  Musculoskeletal: Positive for arthralgias. Negative for back pain and gait problem.  Skin: Negative.   Neurological: Negative.   Psychiatric/Behavioral: Negative.       Objective:   Physical Exam  Constitutional: He is oriented to person, place, and time. He appears well-developed and well-nourished.  HENT:    Head: Normocephalic and atraumatic.  Eyes: EOM are normal.  Neck: Normal range of motion.  Cardiovascular: Normal rate.   Appears to be regular  Pulmonary/Chest: Effort normal and breath sounds normal. No respiratory distress. He has no wheezes. He has no rales. He exhibits no tenderness.  Abdominal: Soft. Bowel sounds are normal. He exhibits no distension. There is no tenderness. There is no rebound.  Musculoskeletal: He exhibits no edema.  Neurological: He is alert and oriented to person, place, and time.  Skin: Skin is warm and dry.   Vitals:   10/05/15 0916  BP: 134/60  Pulse: 81  Resp: 20  Temp: 97.6 F (36.4 C)  TempSrc: Oral  SpO2: 94%  Weight: 221 lb (100.2 kg)  Height: 5\' 7"  (1.702 m)   EKG: rate 55, axis left, sinus, left bundle branch block, ST elevation in V1-V3. No old to compare with.     Assessment & Plan:  Visit time 25 minutes: greater than 50% of the time was spent in counseling and coordination of care with the patient: coordination with EKG and transfer to the ER from the office as well as review of history and the nature of his symptoms.

## 2015-10-05 NOTE — Assessment & Plan Note (Signed)
Has stopped taking his lasix and trace edema in the ankles. Going to ER for evaluation of his chest pain.

## 2015-10-05 NOTE — Patient Instructions (Signed)
We need you to go straight to the emergency room to get the chest pain evaluated.

## 2015-10-05 NOTE — ED Provider Notes (Signed)
[facility] DEPT Provider Note   CSN: [id number] Arrival date & time: 10/05/15  1053  First Provider Contact:  First MD Initiated Contact with Patient 10/05/15 1205        History   Chief Complaint Chief Complaint  Patient presents with  . Chest Pain    HPI Lawrence Warner is a 82 y.o. male.   Chest Pain   This is a recurrent problem. The current episode started 3 to 5 hours ago. The problem has been resolved. The pain is associated with rest (not really exersional). The pain is present in the substernal region. The pain is mild. The quality of the pain is described as brief and sharp. The pain does not radiate. The symptoms are aggravated by certain positions. Pertinent negatives include no abdominal pain, no back pain, no cough, no diaphoresis, no dizziness, no fever, no headaches, no nausea, no palpitations, no shortness of breath, no vomiting and no weakness. He has tried nothing for the symptoms.  His past medical history is significant for CAD, CHF and diabetes.  Procedure history is positive for cardiac catheterization and echocardiogram. Past workup comments: quad bipass in 2000.    Past Medical History:  Diagnosis Date  . BPH (benign prostatic hypertrophy)   . Chronic renal insufficiency   . Coronary artery disease    CABG in 2000  . Diabetes type 2, controlled ([place])    Diet-controlled  . Diabetic neuropathy ([place])   . Dyslipidemia   . Hypertension   . ITP (idiopathic thrombocytopenic purpura)   . Osteoarthritis     Patient Active Problem List   Diagnosis Date Noted  . Chest pain 10/05/2015  . Diet-controlled diabetes mellitus ([place]) 05/07/2015  . CAD (coronary artery disease) 05/07/2015  . Essential hypertension 05/07/2015  . Muscle cramps 04/25/2015  . Shortness of breath 04/11/2015  . ITP (idiopathic thrombocytopenic purpura) 09/27/2014    Past Surgical History:  Procedure Laterality Date  . CHOLECYSTECTOMY    . CORONARY ARTERY BYPASS GRAFT  2000    . Right hip replacement  2014  . TONSILLECTOMY         Home Medications    Prior to Admission medications   Medication Sig Start Date End Date Taking? Authorizing Provider  acetaminophen (TYLENOL) 500 MG tablet Take 250-1,000 mg by mouth every 6 (six) hours as needed for mild pain, moderate pain or headache.    Yes Historical Provider, MD  alfuzosin (UROXATRAL) 10 MG 24 hr tablet Take 10 mg by mouth daily with breakfast.   Yes Historical Provider, MD  Ascorbic Acid (VITAMIN C) 100 MG tablet Take 100 mg by mouth daily.    Yes Historical Provider, MD  cholecalciferol (VITAMIN D) 1000 units tablet Take 1,000 Units by mouth daily.   Yes Historical Provider, MD  losartan (COZAAR) 100 MG tablet Take 100 mg by mouth daily.    Yes Historical Provider, MD  magnesium chloride (SLOW-MAG) 64 MG TBEC SR tablet Take 1 tablet (64 mg total) by mouth daily. 04/25/15  Yes [name], MD  metoprolol tartrate (LOPRESSOR) 25 MG tablet Take 6.25 mg by mouth 2 (two) times daily.    Yes Historical Provider, MD  omeprazole (PRILOSEC) 20 MG capsule Take 20 mg by mouth daily.   Yes Historical Provider, MD  simvastatin (ZOCOR) 20 MG tablet Take 20 mg by mouth at bedtime.    Yes Historical Provider, MD    Family History Family History  Problem Relation Age of Onset  . Family history unknown:  Yes    Social History Social History  Substance Use Topics  . Smoking status: Former Smoker    Packs/day: 2.00    Years: 25.00  . Smokeless tobacco: Never Used  . Alcohol use No     Allergies   Penicillins   Review of Systems Review of Systems  Constitutional: Negative for appetite change, chills, diaphoresis, fatigue and fever.  HENT: Negative for congestion, ear pain, facial swelling, mouth sores and sore throat.   Eyes: Negative for visual disturbance.  Respiratory: Negative for cough, chest tightness and shortness of breath.   Cardiovascular: Positive for chest pain. Negative for palpitations.   Gastrointestinal: Negative for abdominal pain, blood in stool, diarrhea, nausea and vomiting.  Endocrine: Negative for cold intolerance and heat intolerance.  Genitourinary: Negative for decreased urine volume, difficulty urinating and frequency.  Musculoskeletal: Negative for back pain and neck stiffness.  Skin: Negative for rash.  Neurological: Negative for dizziness, weakness, light-headedness and headaches.  All other systems reviewed and are negative.    Physical Exam Updated Vital Signs BP 187/58   Pulse 62   Resp 21   SpO2 94%   Physical Exam  Constitutional: He is oriented to person, place, and time. He appears well-nourished. No distress.  HENT:  Head: Normocephalic and atraumatic.  Right Ear: External ear normal.  Left Ear: External ear normal.  Eyes: Pupils are equal, round, and reactive to light. Right eye exhibits no discharge. Left eye exhibits no discharge. No scleral icterus.  Neck: Normal range of motion. Neck supple.  Cardiovascular: Normal rate.  Exam reveals no gallop and no friction rub.   No murmur heard. Pulmonary/Chest: Effort normal and breath sounds normal. No stridor. No respiratory distress. He has no wheezes. He has no rales. He exhibits no tenderness.  Abdominal: Soft. He exhibits no distension and no mass. There is no tenderness. There is no rebound and no guarding.  Musculoskeletal: He exhibits no edema or tenderness.  1+ BLE edema  Neurological: He is alert and oriented to person, place, and time.  Skin: Skin is warm and dry. No rash noted. He is not diaphoretic. No erythema.     ED Treatments / Results  Labs (all labs ordered are listed, but only abnormal results are displayed) Labs Reviewed  BASIC METABOLIC PANEL - Abnormal; Notable for the following:       Result Value   Glucose, Bld 147 (*)    BUN 23 (*)    Creatinine, Ser 1.49 (*)    GFR calc non Af Amer 38 (*)    GFR calc Af Amer 44 (*)    All other components within normal  limits  CBC - Abnormal; Notable for the following:    RBC 3.58 (*)    Hemoglobin 11.5 (*)    HCT 35.0 (*)    Platelets 140 (*)    All other components within normal limits  BRAIN NATRIURETIC PEPTIDE - Abnormal; Notable for the following:    B Natriuretic Peptide 278.4 (*)    All other components within normal limits  I-STAT TROPOININ, ED  I-STAT TROPOININ, ED  I-STAT TROPOININ, ED    EKG  EKG Interpretation  Date/Time:  Thursday October 05 2015 11:05:17 EDT Ventricular Rate:  46 PR Interval:    QRS Duration: 164 QT Interval:  485 QTC Calculation: 425 R Axis:   -59 Text Interpretation:  Sinus bradycardia Left bundle branch block Baseline wander in lead(s) V1 T-wave inversions in lateral leads.  no prior EKG to  compare to. Confirmed by [facility] MD, [place] [phone]) on 10/05/2015 6:34:40 PM       Radiology Dg Chest 2 View  Result Date: 10/05/2015 CLINICAL DATA:  Left-sided chest pressure EXAM: CHEST  2 VIEW COMPARISON:  04/11/2015 FINDINGS: Cardiac shadow is within normal limits. Postsurgical changes are again seen. Old healed rib fractures are noted on the left. No focal infiltrate or sizable effusion is seen. Degenerative changes of the thoracic spine are noted. IMPRESSION: No active cardiopulmonary disease. Electronically Signed   By: Inez Catalina M.D.   On: 10/05/2015 11:38    Procedures Procedures (including critical care time)  Medications Ordered in ED Medications  aspirin 81 MG chewable tablet (  Not Given 10/05/15 1310)     Initial Impression / Assessment and Plan / ED Course  I have reviewed the triage vital signs and the nursing notes.  Pertinent labs & imaging results that were available during my care of the patient were reviewed by me and considered in my medical decision making (see chart for details).  Clinical Course    Currently asymptomatic. Reports having increased "angina symptoms" for the last month. The symptoms occur when he's lying down in certain  positions and are exacerbated with movement of the chest. No pain on exertion. Symptoms are atypical for cardiac etiology. However given his significant past medical history we'll obtain serial troponins to rule out ACS at this time.  EKG as above showed LBBB andT-wave inversions in lateral leads however there is no prior EKG to compare to. No evidence of sgarbossa. Chest x-ray without evidence suggestive of pneumonia, pneumothorax, pneumomediastinum.  No abnormal contour of the mediastinum to suggest dissection. No evidence of acute injuries.  Troponins negative 3.   Patient with lower extremity edema and a BNP of 278; however patient does not endorse any orthopnea, dyspnea on exertion. Chest x-ray negative for any pulmonary edema. No evidence suggestive of CHF exacerbation.  Patient remained asymptomatic during his care here. Next  The patient is safe for discharge with close follow-up with his primary care provider. Strict return precautions given.  Final Clinical Impressions(s) / ED Diagnoses   Final diagnoses:  Atypical chest pain   Disposition: Discharge  Condition: Good  I have discussed the results, Dx and Tx plan with the patient who expressed understanding and agree(s) with the plan. Discharge instructions discussed at great length. The patient was given strict return precautions who verbalized understanding of the instructions. No further questions at time of discharge.    Current Discharge Medication List      Follow Up: Hoyt Koch, MD [place] [id number] [phone]  Schedule an appointment as soon as possible for a visit in 1 week For close follow up      Fatima Blank, MD 10/05/15 1836

## 2015-10-06 DIAGNOSIS — E1140 Type 2 diabetes mellitus with diabetic neuropathy, unspecified: Secondary | ICD-10-CM | POA: Diagnosis not present

## 2015-10-06 DIAGNOSIS — M1991 Primary osteoarthritis, unspecified site: Secondary | ICD-10-CM | POA: Diagnosis not present

## 2015-10-09 ENCOUNTER — Telehealth: Payer: Self-pay | Admitting: Internal Medicine

## 2015-10-09 ENCOUNTER — Ambulatory Visit (INDEPENDENT_AMBULATORY_CARE_PROVIDER_SITE_OTHER): Payer: Medicare Other | Admitting: Internal Medicine

## 2015-10-09 ENCOUNTER — Encounter: Payer: Self-pay | Admitting: Internal Medicine

## 2015-10-09 ENCOUNTER — Ambulatory Visit: Payer: Medicare Other | Admitting: Internal Medicine

## 2015-10-09 VITALS — BP 168/64 | HR 66 | Temp 97.5°F | Resp 22 | Wt 223.0 lb

## 2015-10-09 DIAGNOSIS — K219 Gastro-esophageal reflux disease without esophagitis: Secondary | ICD-10-CM | POA: Insufficient documentation

## 2015-10-09 DIAGNOSIS — I25119 Atherosclerotic heart disease of native coronary artery with unspecified angina pectoris: Secondary | ICD-10-CM

## 2015-10-09 DIAGNOSIS — R351 Nocturia: Secondary | ICD-10-CM | POA: Diagnosis not present

## 2015-10-09 DIAGNOSIS — R2681 Unsteadiness on feet: Secondary | ICD-10-CM | POA: Diagnosis not present

## 2015-10-09 DIAGNOSIS — N401 Enlarged prostate with lower urinary tract symptoms: Secondary | ICD-10-CM | POA: Diagnosis not present

## 2015-10-09 DIAGNOSIS — I10 Essential (primary) hypertension: Secondary | ICD-10-CM | POA: Diagnosis not present

## 2015-10-09 MED ORDER — METOPROLOL TARTRATE 25 MG PO TABS: 12.5000 mg | ORAL_TABLET | Freq: Two times a day (BID) | ORAL | 0 refills | Status: DC

## 2015-10-09 NOTE — Assessment & Plan Note (Signed)
Previously [facility] and wants local. Having some more gas and reflux symptoms. Encouraged to take omeprazole 40 mg daily as well as zantac as needed or gas-x for gas.

## 2015-10-09 NOTE — Telephone Encounter (Signed)
Faxed to pharmacy

## 2015-10-09 NOTE — Telephone Encounter (Signed)
Rx printed, please fax in.

## 2015-10-09 NOTE — Assessment & Plan Note (Signed)
BP still elevated and will increase his metoprolol to 1/2 pill BID (currently doing 1/4 pill BID).

## 2015-10-09 NOTE — Patient Instructions (Signed)
Call us with the name of the [facility] or the fax number so we can send them the prescription.   We will get the home health orders done and will get the heart doctor's office to call you.

## 2015-10-09 NOTE — Progress Notes (Signed)
Pre visit review using our clinic review tool, if applicable. No additional management support is needed unless otherwise documented below in the visit note. 

## 2015-10-09 NOTE — Assessment & Plan Note (Signed)
Referral to cardiology for evaluation. [facility]. He is having some anginal pains in the last several weeks although none in the last couple of days. ER evaluation ruled out acute MI last week with EKG changes (likely old but none to compare with). Ordered PT/home health today to help with deconditioning.

## 2015-10-09 NOTE — Assessment & Plan Note (Signed)
Needs PT/nursing to help with change in medical status with his recent ER visit and clinical change. He is a high fall risk and problems with his balance as well.

## 2015-10-09 NOTE — Telephone Encounter (Signed)
Patient states he was to call Dr. Sharlet Salina to give her [facility] [phone] and fax [phone].   States this was to fax a script over.

## 2015-10-09 NOTE — Progress Notes (Signed)
   Subjective:    Patient ID: Lawrence Warner, male    DOB: [birth year], 82 y.o.   MRN: [id number]  HPI The patient is a 82 YO man here for ER follow up (in for chest pains and SOB, evaluated without acute heart attack). He is doing well since the ER visit. No chest pains but having SOB. He has had several episodes of anginal pains in the last several weeks. He is still having a lot of problems with walking and balance. He is deconditioned and needs help to get back moving some. His son is with him and helps to provide some of the history.   Review of Systems  Constitutional: Positive for fatigue. Negative for activity change, appetite change, diaphoresis, fever and unexpected weight change.  HENT: Negative.   Respiratory: Positive for shortness of breath. Negative for cough, chest tightness and wheezing.   Cardiovascular: Negative for chest pain, palpitations and leg swelling.  Gastrointestinal: Negative for abdominal distention, abdominal pain, constipation, diarrhea and nausea.  Musculoskeletal: Positive for arthralgias. Negative for back pain and gait problem.  Skin: Negative.   Neurological: Positive for weakness.  Psychiatric/Behavioral: Negative.       Objective:   Physical Exam  Constitutional: He is oriented to person, place, and time. He appears well-developed and well-nourished.  HENT:  Head: Normocephalic and atraumatic.  Eyes: EOM are normal.  Neck: Normal range of motion.  Cardiovascular: Normal rate.   Appears to be regular  Pulmonary/Chest: Effort normal and breath sounds normal. No respiratory distress. He has no wheezes. He has no rales. He exhibits no tenderness.  Abdominal: Soft. Bowel sounds are normal. He exhibits no distension. There is no tenderness. There is no rebound.  Musculoskeletal: He exhibits no edema.  Neurological: He is alert and oriented to person, place, and time. Coordination abnormal.  Slow to rise and hesitating gait, uses cane  Skin: Skin is  warm and dry.   Vitals:   10/09/15 1409  BP: (!) 172/70  Pulse: 66  Resp: (!) 22  Temp: 97.5 F (36.4 C)  TempSrc: Oral  SpO2: 94%  Weight: 223 lb (101.2 kg)   \    Assessment & Plan:

## 2015-10-10 ENCOUNTER — Ambulatory Visit (HOSPITAL_BASED_OUTPATIENT_CLINIC_OR_DEPARTMENT_OTHER): Payer: Medicare Other

## 2015-10-10 VITALS — BP 162/48 | HR 60 | Temp 98.1°F | Resp 24

## 2015-10-10 DIAGNOSIS — D693 Immune thrombocytopenic purpura: Secondary | ICD-10-CM

## 2015-10-10 LAB — CBC & DIFF AND RETIC
BASO%: 0.2 % (ref 0.0–2.0)
Basophils Absolute: 0 10*3/uL (ref 0.0–0.1)
EOS ABS: 0.1 10*3/uL (ref 0.0–0.5)
EOS%: 0.9 % (ref 0.0–7.0)
HCT: 35.6 % — ABNORMAL LOW (ref 38.4–49.9)
HGB: 11.7 g/dL — ABNORMAL LOW (ref 13.0–17.1)
IMMATURE RETIC FRACT: 10 % (ref 3.00–10.60)
LYMPH#: 1.3 10*3/uL (ref 0.9–3.3)
LYMPH%: 25 % (ref 14.0–49.0)
MCH: 31.9 pg (ref 27.2–33.4)
MCHC: 32.9 g/dL (ref 32.0–36.0)
MCV: 97 fL (ref 79.3–98.0)
MONO#: 0.6 10*3/uL (ref 0.1–0.9)
MONO%: 11.8 % (ref 0.0–14.0)
NEUT%: 62.1 % (ref 39.0–75.0)
NEUTROS ABS: 3.3 10*3/uL (ref 1.5–6.5)
NRBC: 0 % (ref 0–0)
PLATELETS: 182 10*3/uL (ref 140–400)
RBC: 3.67 10*6/uL — AB (ref 4.20–5.82)
RDW: 13 % (ref 11.0–14.6)
RETIC %: 1.95 % — AB (ref 0.80–1.80)
Retic Ct Abs: 71.57 10*3/uL (ref 34.80–93.90)
WBC: 5.3 10*3/uL (ref 4.0–10.3)

## 2015-10-10 MED ORDER — ROMIPLOSTIM 250 MCG ~~LOC~~ SOLR
1.0000 ug/kg | SUBCUTANEOUS | Status: DC
  Administered 2015-10-10: 100 ug via SUBCUTANEOUS
  Filled 2015-10-10: qty 0.2

## 2015-10-10 NOTE — Patient Instructions (Signed)
Romiplostim injection What is this medicine? ROMIPLOSTIM (roe mi PLOE stim) helps your body make more platelets. This medicine is used to treat low platelets caused by chronic idiopathic thrombocytopenic purpura (ITP). This medicine may be used for other purposes; ask your health care provider or pharmacist if you have questions. What should I tell my health care provider before I take this medicine? They need to know if you have any of these conditions: -cancer or myelodysplastic syndrome -low blood counts, like low white cell, platelet, or red cell counts -take medicines that treat or prevent blood clots -an unusual or allergic reaction to romiplostim, mannitol, other medicines, foods, dyes, or preservatives -pregnant or trying to get pregnant -breast-feeding How should I use this medicine? This medicine is for injection under the skin. It is given by a health care professional in a hospital or clinic setting. A special MedGuide will be given to you before your injection. Read this information carefully each time. Talk to your pediatrician regarding the use of this medicine in children. Special care may be needed. Overdosage: If you think you have taken too much of this medicine contact a poison control center or emergency room at once. NOTE: This medicine is only for you. Do not share this medicine with others. What if I miss a dose? It is important not to miss your dose. Call your doctor or health care professional if you are unable to keep an appointment. What may interact with this medicine? Interactions are not expected. This list may not describe all possible interactions. Give your health care provider a list of all the medicines, herbs, non-prescription drugs, or dietary supplements you use. Also tell them if you smoke, drink alcohol, or use illegal drugs. Some items may interact with your medicine. What should I watch for while using this medicine? Your condition will be monitored  carefully while you are receiving this medicine. Visit your prescriber or health care professional for regular checks on your progress and for the needed blood tests. It is important to keep all appointments. What side effects may I notice from receiving this medicine? Side effects that you should report to your doctor or health care professional as soon as possible: -allergic reactions like skin rash, itching or hives, swelling of the face, lips, or tongue -shortness of breath, chest pain, swelling in a leg -unusual bleeding or bruising Side effects that usually do not require medical attention (report to your doctor or health care professional if they continue or are bothersome): -dizziness -headache -muscle aches -pain in arms and legs -stomach pain -trouble sleeping This list may not describe all possible side effects. Call your doctor for medical advice about side effects. You may report side effects to FDA at 1-800-FDA-1088. Where should I keep my medicine? This drug is given in a hospital or clinic and will not be stored at home. NOTE: This sheet is a summary. It may not cover all possible information. If you have questions about this medicine, talk to your doctor, pharmacist, or health care provider.    2016, Elsevier/Gold Standard. (2007-10-12 15:13:04)  

## 2015-10-11 DIAGNOSIS — E1140 Type 2 diabetes mellitus with diabetic neuropathy, unspecified: Secondary | ICD-10-CM | POA: Diagnosis not present

## 2015-10-11 DIAGNOSIS — M1991 Primary osteoarthritis, unspecified site: Secondary | ICD-10-CM | POA: Diagnosis not present

## 2015-10-12 DIAGNOSIS — M1991 Primary osteoarthritis, unspecified site: Secondary | ICD-10-CM | POA: Diagnosis not present

## 2015-10-12 DIAGNOSIS — E1140 Type 2 diabetes mellitus with diabetic neuropathy, unspecified: Secondary | ICD-10-CM | POA: Diagnosis not present

## 2015-10-17 ENCOUNTER — Other Ambulatory Visit (HOSPITAL_BASED_OUTPATIENT_CLINIC_OR_DEPARTMENT_OTHER): Payer: Medicare Other

## 2015-10-17 ENCOUNTER — Other Ambulatory Visit: Payer: Medicare Other

## 2015-10-17 ENCOUNTER — Ambulatory Visit: Payer: Medicare Other

## 2015-10-17 ENCOUNTER — Ambulatory Visit (HOSPITAL_BASED_OUTPATIENT_CLINIC_OR_DEPARTMENT_OTHER): Payer: Medicare Other

## 2015-10-17 VITALS — BP 165/62 | HR 61 | Temp 97.9°F | Resp 22

## 2015-10-17 DIAGNOSIS — D693 Immune thrombocytopenic purpura: Secondary | ICD-10-CM | POA: Diagnosis present

## 2015-10-17 LAB — CBC WITH DIFFERENTIAL/PLATELET
BASO%: 0.2 % (ref 0.0–2.0)
BASOS ABS: 0 10*3/uL (ref 0.0–0.1)
EOS ABS: 0 10*3/uL (ref 0.0–0.5)
EOS%: 0.9 % (ref 0.0–7.0)
HEMATOCRIT: 34.8 % — AB (ref 38.4–49.9)
HGB: 11.5 g/dL — ABNORMAL LOW (ref 13.0–17.1)
LYMPH%: 26.9 % (ref 14.0–49.0)
MCH: 32 pg (ref 27.2–33.4)
MCHC: 33 g/dL (ref 32.0–36.0)
MCV: 96.9 fL (ref 79.3–98.0)
MONO#: 0.4 10*3/uL (ref 0.1–0.9)
MONO%: 8.8 % (ref 0.0–14.0)
NEUT%: 63.2 % (ref 39.0–75.0)
NEUTROS ABS: 2.9 10*3/uL (ref 1.5–6.5)
NRBC: 0 % (ref 0–0)
PLATELETS: 169 10*3/uL (ref 140–400)
RBC: 3.59 10*6/uL — ABNORMAL LOW (ref 4.20–5.82)
RDW: 12.8 % (ref 11.0–14.6)
WBC: 4.7 10*3/uL (ref 4.0–10.3)
lymph#: 1.3 10*3/uL (ref 0.9–3.3)

## 2015-10-17 MED ORDER — ROMIPLOSTIM 250 MCG ~~LOC~~ SOLR
1.0000 ug/kg | SUBCUTANEOUS | Status: DC
  Administered 2015-10-17: 100 ug via SUBCUTANEOUS
  Filled 2015-10-17: qty 0.2

## 2015-10-17 NOTE — Patient Instructions (Signed)
Romiplostim injection What is this medicine? ROMIPLOSTIM ([place] mi PLOE stim) helps your body make more platelets. This medicine is used to treat low platelets caused by chronic idiopathic thrombocytopenic purpura (ITP). This medicine may be used for other purposes; ask your health care provider or pharmacist if you have questions. What should I tell my health care provider before I take this medicine? They need to know if you have any of these conditions: -cancer or myelodysplastic syndrome -low blood counts, like low white cell, platelet, or red cell counts -take medicines that treat or prevent blood clots -an unusual or allergic reaction to romiplostim, mannitol, other medicines, foods, dyes, or preservatives -pregnant or trying to get pregnant -breast-feeding How should I use this medicine? This medicine is for injection under the skin. It is given by a health care professional in a hospital or clinic setting. A special MedGuide will be given to you before your injection. Read this information carefully each time. Talk to your pediatrician regarding the use of this medicine in children. Special care may be needed. Overdosage: If you think you have taken too much of this medicine contact a poison control center or emergency room at once. NOTE: This medicine is only for you. Do not share this medicine with others. What if I miss a dose? It is important not to miss your dose. Call your doctor or health care professional if you are unable to keep an appointment. What may interact with this medicine? Interactions are not expected. This list may not describe all possible interactions. Give your health care provider a list of all the medicines, herbs, non-prescription drugs, or dietary supplements you use. Also tell them if you smoke, drink alcohol, or use illegal drugs. Some items may interact with your medicine. What should I watch for while using this medicine? Your condition will be monitored  carefully while you are receiving this medicine. Visit your prescriber or health care professional for regular checks on your progress and for the needed blood tests. It is important to keep all appointments. What side effects may I notice from receiving this medicine? Side effects that you should report to your doctor or health care professional as soon as possible: -allergic reactions like skin rash, itching or hives, swelling of the face, lips, or tongue -shortness of breath, chest pain, swelling in a leg -unusual bleeding or bruising Side effects that usually do not require medical attention (report to your doctor or health care professional if they continue or are bothersome): -dizziness -headache -muscle aches -pain in arms and legs -stomach pain -trouble sleeping This list may not describe all possible side effects. Call your doctor for medical advice about side effects. You may report side effects to FDA at 1-800-FDA-1088. Where should I keep my medicine? This drug is given in a hospital or clinic and will not be stored at home. NOTE: This sheet is a summary. It may not cover all possible information. If you have questions about this medicine, talk to your doctor, pharmacist, or health care provider.    2016, Elsevier/Gold Standard. (2007-10-12 15:13:04) Cyanocobalamin, Vitamin B12 injection What is this medicine? CYANOCOBALAMIN (sye an oh koe BAL a min) is a man made form of vitamin B12. Vitamin B12 is used in the growth of healthy blood cells, nerve cells, and proteins in the body. It also helps with the metabolism of fats and carbohydrates. This medicine is used to treat people who can not absorb vitamin B12. This medicine may be used for other   purposes; ask your health care provider or pharmacist if you have questions. What should I tell my health care provider before I take this medicine? They need to know if you have any of these conditions: -kidney disease -Leber's  disease -megaloblastic anemia -an unusual or allergic reaction to cyanocobalamin, cobalt, other medicines, foods, dyes, or preservatives -pregnant or trying to get pregnant -breast-feeding How should I use this medicine? This medicine is injected into a muscle or deeply under the skin. It is usually given by a health care professional in a clinic or doctor's office. However, your doctor may teach you how to inject yourself. Follow all instructions. Talk to your pediatrician regarding the use of this medicine in children. Special care may be needed. Overdosage: If you think you have taken too much of this medicine contact a poison control center or emergency room at once. NOTE: This medicine is only for you. Do not share this medicine with others. What if I miss a dose? If you are given your dose at a clinic or doctor's office, call to reschedule your appointment. If you give your own injections and you miss a dose, take it as soon as you can. If it is almost time for your next dose, take only that dose. Do not take double or extra doses. What may interact with this medicine? -colchicine -heavy alcohol intake This list may not describe all possible interactions. Give your health care provider a list of all the medicines, herbs, non-prescription drugs, or dietary supplements you use. Also tell them if you smoke, drink alcohol, or use illegal drugs. Some items may interact with your medicine. What should I watch for while using this medicine? Visit your doctor or health care professional regularly. You may need blood work done while you are taking this medicine. You may need to follow a special diet. Talk to your doctor. Limit your alcohol intake and avoid smoking to get the best benefit. What side effects may I notice from receiving this medicine? Side effects that you should report to your doctor or health care professional as soon as possible: -allergic reactions like skin rash, itching or  hives, swelling of the face, lips, or tongue -blue tint to skin -chest tightness, pain -difficulty breathing, wheezing -dizziness -red, swollen painful area on the leg Side effects that usually do not require medical attention (report to your doctor or health care professional if they continue or are bothersome): -diarrhea -headache This list may not describe all possible side effects. Call your doctor for medical advice about side effects. You may report side effects to FDA at 1-800-FDA-1088. Where should I keep my medicine? Keep out of the reach of children. Store at room temperature between 15 and 30 degrees C (59 and 85 degrees F). Protect from light. Throw away any unused medicine after the expiration date. NOTE: This sheet is a summary. It may not cover all possible information. If you have questions about this medicine, talk to your doctor, pharmacist, or health care provider.    2016, Elsevier/Gold Standard. (2007-05-25 22:10:20)  

## 2015-10-18 DIAGNOSIS — M1991 Primary osteoarthritis, unspecified site: Secondary | ICD-10-CM | POA: Diagnosis not present

## 2015-10-18 DIAGNOSIS — E1140 Type 2 diabetes mellitus with diabetic neuropathy, unspecified: Secondary | ICD-10-CM | POA: Diagnosis not present

## 2015-10-19 ENCOUNTER — Telehealth: Payer: Self-pay

## 2015-10-19 ENCOUNTER — Telehealth: Payer: Self-pay | Admitting: Internal Medicine

## 2015-10-19 DIAGNOSIS — D0462 Carcinoma in situ of skin of left upper limb, including shoulder: Secondary | ICD-10-CM | POA: Diagnosis not present

## 2015-10-19 DIAGNOSIS — M1991 Primary osteoarthritis, unspecified site: Secondary | ICD-10-CM | POA: Diagnosis not present

## 2015-10-19 DIAGNOSIS — L570 Actinic keratosis: Secondary | ICD-10-CM | POA: Diagnosis not present

## 2015-10-19 DIAGNOSIS — E1140 Type 2 diabetes mellitus with diabetic neuropathy, unspecified: Secondary | ICD-10-CM | POA: Diagnosis not present

## 2015-10-19 NOTE — Telephone Encounter (Signed)
Paperwork signed, faxed, copy sent to scan 

## 2015-10-19 NOTE — Telephone Encounter (Signed)
Faxed to pharmacy

## 2015-10-19 NOTE — Telephone Encounter (Signed)
Home Health Cert/Plan of Care received (09/04/2015 - 11/02/2015) and placed on MD's desk for signature

## 2015-10-19 NOTE — Telephone Encounter (Signed)
Needs last OV note faxed to [phone].

## 2015-10-23 DIAGNOSIS — M1991 Primary osteoarthritis, unspecified site: Secondary | ICD-10-CM | POA: Diagnosis not present

## 2015-10-23 DIAGNOSIS — E1140 Type 2 diabetes mellitus with diabetic neuropathy, unspecified: Secondary | ICD-10-CM | POA: Diagnosis not present

## 2015-10-24 ENCOUNTER — Ambulatory Visit (HOSPITAL_BASED_OUTPATIENT_CLINIC_OR_DEPARTMENT_OTHER): Payer: Medicare Other

## 2015-10-24 ENCOUNTER — Ambulatory Visit: Payer: Medicare Other

## 2015-10-24 VITALS — BP 166/49 | HR 60 | Temp 97.9°F | Resp 22

## 2015-10-24 DIAGNOSIS — Z95828 Presence of other vascular implants and grafts: Secondary | ICD-10-CM | POA: Insufficient documentation

## 2015-10-24 DIAGNOSIS — D693 Immune thrombocytopenic purpura: Secondary | ICD-10-CM

## 2015-10-24 MED ORDER — HEPARIN SOD (PORK) LOCK FLUSH 100 UNIT/ML IV SOLN
500.0000 [IU] | Freq: Once | INTRAVENOUS | Status: DC | PRN
  Filled 2015-10-24: qty 5

## 2015-10-24 MED ORDER — SODIUM CHLORIDE 0.9 % IJ SOLN
10.0000 mL | INTRAMUSCULAR | Status: DC | PRN
  Filled 2015-10-24: qty 10

## 2015-10-24 MED ORDER — ROMIPLOSTIM 250 MCG ~~LOC~~ SOLR
100.0000 ug | SUBCUTANEOUS | Status: DC
  Administered 2015-10-24: 100 ug via SUBCUTANEOUS
  Filled 2015-10-24: qty 0.2

## 2015-10-24 NOTE — Progress Notes (Signed)
Ok to give pt Nplate without CBC per Dr. Irene Limbo

## 2015-10-24 NOTE — Addendum Note (Signed)
Addended by: Carlene Coria L on: 10/24/2015 03:03 PM   Modules accepted: Orders

## 2015-10-25 ENCOUNTER — Telehealth: Payer: Self-pay | Admitting: Cardiology

## 2015-10-25 ENCOUNTER — Telehealth: Payer: Self-pay | Admitting: Emergency Medicine

## 2015-10-25 DIAGNOSIS — E1140 Type 2 diabetes mellitus with diabetic neuropathy, unspecified: Secondary | ICD-10-CM | POA: Diagnosis not present

## 2015-10-25 DIAGNOSIS — M1991 Primary osteoarthritis, unspecified site: Secondary | ICD-10-CM | POA: Diagnosis not present

## 2015-10-25 NOTE — Telephone Encounter (Signed)
This is ok I don't see any concners

## 2015-10-25 NOTE — Telephone Encounter (Signed)
Attempted to c/b to discuss concerns.  NA and no VM.  Pt has not been seen by Dr Marlou Porch before and is not scheduled to see him until 01/04/2016.  Should contact PCP for further evaluation and treatment.

## 2015-10-25 NOTE — Telephone Encounter (Signed)
New message      Pt c/o swelling: STAT is pt has developed SOB within 24 hours  1. How long have you been experiencing swelling? Along time  2. Where is the swelling located? ankles  3.  Are you currently taking a "fluid pill"? Yes but not regularly  4.  Are you currently SOB? no  5.  Have you traveled recently? no  Has had fluctuation with his weight

## 2015-10-25 NOTE — Telephone Encounter (Signed)
Is this ok. Please advise in Dr. Charlynne Cousins absence, thanks.

## 2015-10-25 NOTE — Telephone Encounter (Signed)
Lawrence Warner called and wanted to let you know his heart rate is 45-50. She just has to let you know according to orders. His heart rate does usually run in the low 50's. Other than that the patient looks fine. Just wanted to let you know thanks.

## 2015-10-26 DIAGNOSIS — M1991 Primary osteoarthritis, unspecified site: Secondary | ICD-10-CM | POA: Diagnosis not present

## 2015-10-26 DIAGNOSIS — E1140 Type 2 diabetes mellitus with diabetic neuropathy, unspecified: Secondary | ICD-10-CM | POA: Diagnosis not present

## 2015-10-26 NOTE — Telephone Encounter (Signed)
Attempted again to contact patient to discuss concerns.  NA/ no VM.  Will wait for pt to call back to discuss.

## 2015-10-31 ENCOUNTER — Other Ambulatory Visit (HOSPITAL_BASED_OUTPATIENT_CLINIC_OR_DEPARTMENT_OTHER): Payer: Medicare Other

## 2015-10-31 ENCOUNTER — Other Ambulatory Visit: Payer: Medicare Other

## 2015-10-31 ENCOUNTER — Ambulatory Visit (HOSPITAL_BASED_OUTPATIENT_CLINIC_OR_DEPARTMENT_OTHER): Payer: Medicare Other

## 2015-10-31 VITALS — BP 125/41 | HR 60 | Temp 98.3°F | Resp 22

## 2015-10-31 DIAGNOSIS — Z95828 Presence of other vascular implants and grafts: Secondary | ICD-10-CM

## 2015-10-31 DIAGNOSIS — D693 Immune thrombocytopenic purpura: Secondary | ICD-10-CM

## 2015-10-31 LAB — CBC & DIFF AND RETIC
BASO%: 0.2 % (ref 0.0–2.0)
BASOS ABS: 0 10*3/uL (ref 0.0–0.1)
EOS ABS: 0.1 10*3/uL (ref 0.0–0.5)
EOS%: 1.2 % (ref 0.0–7.0)
HEMATOCRIT: 33.1 % — AB (ref 38.4–49.9)
HEMOGLOBIN: 10.9 g/dL — AB (ref 13.0–17.1)
IMMATURE RETIC FRACT: 13.1 % — AB (ref 3.00–10.60)
LYMPH#: 1.4 10*3/uL (ref 0.9–3.3)
LYMPH%: 23.4 % (ref 14.0–49.0)
MCH: 32.1 pg (ref 27.2–33.4)
MCHC: 32.9 g/dL (ref 32.0–36.0)
MCV: 97.4 fL (ref 79.3–98.0)
MONO#: 0.6 10*3/uL (ref 0.1–0.9)
MONO%: 9.8 % (ref 0.0–14.0)
NEUT#: 3.9 10*3/uL (ref 1.5–6.5)
NEUT%: 65.4 % (ref 39.0–75.0)
NRBC: 0 % (ref 0–0)
Platelets: 197 10*3/uL (ref 140–400)
RBC: 3.4 10*6/uL — ABNORMAL LOW (ref 4.20–5.82)
RDW: 12.9 % (ref 11.0–14.6)
RETIC %: 1.9 % — AB (ref 0.80–1.80)
Retic Ct Abs: 64.6 10*3/uL (ref 34.80–93.90)
WBC: 5.9 10*3/uL (ref 4.0–10.3)

## 2015-10-31 MED ORDER — ROMIPLOSTIM 250 MCG ~~LOC~~ SOLR
100.0000 ug | SUBCUTANEOUS | Status: DC
  Administered 2015-10-31: 100 ug via SUBCUTANEOUS
  Filled 2015-10-31: qty 0.2

## 2015-10-31 NOTE — Patient Instructions (Signed)
Romiplostim injection What is this medicine? ROMIPLOSTIM ([place] mi PLOE stim) helps your body make more platelets. This medicine is used to treat low platelets caused by chronic idiopathic thrombocytopenic purpura (ITP). This medicine may be used for other purposes; ask your health care provider or pharmacist if you have questions. What should I tell my health care provider before I take this medicine? They need to know if you have any of these conditions: -cancer or myelodysplastic syndrome -low blood counts, like low white cell, platelet, or red cell counts -take medicines that treat or prevent blood clots -an unusual or allergic reaction to romiplostim, mannitol, other medicines, foods, dyes, or preservatives -pregnant or trying to get pregnant -breast-feeding How should I use this medicine? This medicine is for injection under the skin. It is given by a health care professional in a hospital or clinic setting. A special MedGuide will be given to you before your injection. Read this information carefully each time. Talk to your pediatrician regarding the use of this medicine in children. Special care may be needed. Overdosage: If you think you have taken too much of this medicine contact a poison control center or emergency room at once. NOTE: This medicine is only for you. Do not share this medicine with others. What if I miss a dose? It is important not to miss your dose. Call your doctor or health care professional if you are unable to keep an appointment. What may interact with this medicine? Interactions are not expected. This list may not describe all possible interactions. Give your health care provider a list of all the medicines, herbs, non-prescription drugs, or dietary supplements you use. Also tell them if you smoke, drink alcohol, or use illegal drugs. Some items may interact with your medicine. What should I watch for while using this medicine? Your condition will be monitored  carefully while you are receiving this medicine. Visit your prescriber or health care professional for regular checks on your progress and for the needed blood tests. It is important to keep all appointments. What side effects may I notice from receiving this medicine? Side effects that you should report to your doctor or health care professional as soon as possible: -allergic reactions like skin rash, itching or hives, swelling of the face, lips, or tongue -shortness of breath, chest pain, swelling in a leg -unusual bleeding or bruising Side effects that usually do not require medical attention (report to your doctor or health care professional if they continue or are bothersome): -dizziness -headache -muscle aches -pain in arms and legs -stomach pain -trouble sleeping This list may not describe all possible side effects. Call your doctor for medical advice about side effects. You may report side effects to FDA at 1-800-FDA-1088. Where should I keep my medicine? This drug is given in a hospital or clinic and will not be stored at home. NOTE: This sheet is a summary. It may not cover all possible information. If you have questions about this medicine, talk to your doctor, pharmacist, or health care provider.    2016, Elsevier/Gold Standard. (2007-10-12 15:13:04) Cyanocobalamin, Vitamin B12 injection What is this medicine? CYANOCOBALAMIN (sye an oh koe BAL a min) is a man made form of vitamin B12. Vitamin B12 is used in the growth of healthy blood cells, nerve cells, and proteins in the body. It also helps with the metabolism of fats and carbohydrates. This medicine is used to treat people who can not absorb vitamin B12. This medicine may be used for other   purposes; ask your health care provider or pharmacist if you have questions. What should I tell my health care provider before I take this medicine? They need to know if you have any of these conditions: -kidney disease -Leber's  disease -megaloblastic anemia -an unusual or allergic reaction to cyanocobalamin, cobalt, other medicines, foods, dyes, or preservatives -pregnant or trying to get pregnant -breast-feeding How should I use this medicine? This medicine is injected into a muscle or deeply under the skin. It is usually given by a health care professional in a clinic or doctor's office. However, your doctor may teach you how to inject yourself. Follow all instructions. Talk to your pediatrician regarding the use of this medicine in children. Special care may be needed. Overdosage: If you think you have taken too much of this medicine contact a poison control center or emergency room at once. NOTE: This medicine is only for you. Do not share this medicine with others. What if I miss a dose? If you are given your dose at a clinic or doctor's office, call to reschedule your appointment. If you give your own injections and you miss a dose, take it as soon as you can. If it is almost time for your next dose, take only that dose. Do not take double or extra doses. What may interact with this medicine? -colchicine -heavy alcohol intake This list may not describe all possible interactions. Give your health care provider a list of all the medicines, herbs, non-prescription drugs, or dietary supplements you use. Also tell them if you smoke, drink alcohol, or use illegal drugs. Some items may interact with your medicine. What should I watch for while using this medicine? Visit your doctor or health care professional regularly. You may need blood work done while you are taking this medicine. You may need to follow a special diet. Talk to your doctor. Limit your alcohol intake and avoid smoking to get the best benefit. What side effects may I notice from receiving this medicine? Side effects that you should report to your doctor or health care professional as soon as possible: -allergic reactions like skin rash, itching or  hives, swelling of the face, lips, or tongue -blue tint to skin -chest tightness, pain -difficulty breathing, wheezing -dizziness -red, swollen painful area on the leg Side effects that usually do not require medical attention (report to your doctor or health care professional if they continue or are bothersome): -diarrhea -headache This list may not describe all possible side effects. Call your doctor for medical advice about side effects. You may report side effects to FDA at 1-800-FDA-1088. Where should I keep my medicine? Keep out of the reach of children. Store at room temperature between 15 and 30 degrees C (59 and 85 degrees F). Protect from light. Throw away any unused medicine after the expiration date. NOTE: This sheet is a summary. It may not cover all possible information. If you have questions about this medicine, talk to your doctor, pharmacist, or health care provider.    2016, Elsevier/Gold Standard. (2007-05-25 22:10:20)  

## 2015-11-01 DIAGNOSIS — M1991 Primary osteoarthritis, unspecified site: Secondary | ICD-10-CM | POA: Diagnosis not present

## 2015-11-01 DIAGNOSIS — E1140 Type 2 diabetes mellitus with diabetic neuropathy, unspecified: Secondary | ICD-10-CM | POA: Diagnosis not present

## 2015-11-02 ENCOUNTER — Telehealth: Payer: Self-pay | Admitting: Cardiology

## 2015-11-02 DIAGNOSIS — E1140 Type 2 diabetes mellitus with diabetic neuropathy, unspecified: Secondary | ICD-10-CM | POA: Diagnosis not present

## 2015-11-02 DIAGNOSIS — M1991 Primary osteoarthritis, unspecified site: Secondary | ICD-10-CM | POA: Diagnosis not present

## 2015-11-02 NOTE — Telephone Encounter (Signed)
Spoke with Juliann Pulse at [facility] and she states pt has gained 16lbs in the last month and has noticed more SOB when lying flat.  HR dropped to 42 last week.  Ulla Potash that pt has not been seen in our office and will need to consult PCP for evaluation and treatment.  Juliann Pulse asked if there was any way Dr. Marlou Porch can see pt sooner.  Advised that I do not see a sooner appt and will have to send message to his nurse and have her contact them with sooner availability if there is any.  Juliann Pulse verbalized understanding.

## 2015-11-02 NOTE — Telephone Encounter (Signed)
Lawrence Warner([facility] ) is calling because Mr. Reason has gained 16pounds in the last month , Short of breath when he lays flat , confusion and weakness . Would like to see someone today . Schedule to See Dr. Marlou Porch on November 2. Please call  Thanks

## 2015-11-03 ENCOUNTER — Ambulatory Visit (INDEPENDENT_AMBULATORY_CARE_PROVIDER_SITE_OTHER): Payer: Medicare Other | Admitting: Internal Medicine

## 2015-11-03 ENCOUNTER — Encounter: Payer: Self-pay | Admitting: Internal Medicine

## 2015-11-03 DIAGNOSIS — I10 Essential (primary) hypertension: Secondary | ICD-10-CM | POA: Diagnosis not present

## 2015-11-03 DIAGNOSIS — I25119 Atherosclerotic heart disease of native coronary artery with unspecified angina pectoris: Secondary | ICD-10-CM

## 2015-11-03 DIAGNOSIS — R0602 Shortness of breath: Secondary | ICD-10-CM | POA: Diagnosis not present

## 2015-11-03 NOTE — Progress Notes (Signed)
   Subjective:    Patient ID: Chrstopher Malenfant, male    DOB: [birth year], 82 y.o.   MRN: [id number]  HPI The patient is a 82 YO man coming in for low heart rate. He was recently started on alfuzosin which caused him to feel out of it for many hours and heart rate was in the 30s. He also had increased his metoprolol to 1/2 pill twice a day as we discussed. He did both at once. He went back down to taking 1/4 pill metoprolol twice a day and stopped the prostate medicine altogether. He is now feeling back to normal. He is still SOB and this has not changed in years. He weight is stable from 2 weeks ago. No fevers or chills. Legs are not swollen. He has new fluid pill from his other doctor with [facility] and took it and did not notice any changes in his symptoms or urination so he stopped taking it about 3-4 days ago. Working with PT so his weakness is mildly improved. His diet is stable and the facility does not have a flexible menu so his options are limited.   Review of Systems  Constitutional: Positive for fatigue. Negative for activity change, appetite change, diaphoresis, fever and unexpected weight change.  HENT: Negative.   Respiratory: Positive for shortness of breath. Negative for cough, chest tightness and wheezing.   Cardiovascular: Negative for chest pain, palpitations and leg swelling.  Gastrointestinal: Negative for abdominal distention, abdominal pain, constipation, diarrhea and nausea.  Musculoskeletal: Positive for arthralgias. Negative for back pain and gait problem.  Skin: Negative.   Neurological: Negative for dizziness, facial asymmetry and weakness.  Psychiatric/Behavioral: Negative.       Objective:   Physical Exam  Constitutional: He is oriented to person, place, and time. He appears well-developed and well-nourished.  HENT:  Head: Normocephalic and atraumatic.  Eyes: EOM are normal.  Neck: Normal range of motion.  Cardiovascular: Normal rate.   Appears to be regular    Pulmonary/Chest: Effort normal and breath sounds normal. No respiratory distress. He has no wheezes. He has no rales. He exhibits no tenderness.  Abdominal: Soft. Bowel sounds are normal. He exhibits no distension. There is no tenderness. There is no rebound.  Musculoskeletal: He exhibits no edema.  Neurological: He is alert and oriented to person, place, and time. Coordination abnormal.  Slow to rise and hesitating gait, uses cane  Skin: Skin is warm and dry.   Vitals:   11/03/15 1602  BP: (!) 178/62  Pulse: (!) 54  Resp: 20  Temp: 97.7 F (36.5 C)  TempSrc: Oral  SpO2: 94%  Weight: 223 lb (101.2 kg)  Height: 5\' 7"  (1.702 m)      Assessment & Plan:

## 2015-11-03 NOTE — Patient Instructions (Addendum)
Furosemide or lasix is the fluid pill.   If you are not taking that let us know.

## 2015-11-03 NOTE — Progress Notes (Signed)
Pre visit review using our clinic review tool, if applicable. No additional management support is needed unless otherwise documented below in the visit note. 

## 2015-11-03 NOTE — Assessment & Plan Note (Signed)
Stable from before, his weight is stable. No change from 2 weeks ago and legs not swollen on exam.

## 2015-11-03 NOTE — Assessment & Plan Note (Signed)
BP mildly elevated and will keep him on 1/4 metoprolol BID and losartan 100 mg daily. He does not have room to increase his BB due to HR 54 today on the 6.25 mg BID metoprolol.

## 2015-11-07 ENCOUNTER — Ambulatory Visit (HOSPITAL_BASED_OUTPATIENT_CLINIC_OR_DEPARTMENT_OTHER): Payer: Medicare Other

## 2015-11-07 VITALS — BP 160/42 | HR 54 | Temp 97.9°F | Resp 22

## 2015-11-07 DIAGNOSIS — D693 Immune thrombocytopenic purpura: Secondary | ICD-10-CM

## 2015-11-07 DIAGNOSIS — Z95828 Presence of other vascular implants and grafts: Secondary | ICD-10-CM

## 2015-11-07 MED ORDER — CYANOCOBALAMIN 1000 MCG/ML IJ SOLN
1000.0000 ug | INTRAMUSCULAR | Status: DC
  Administered 2015-11-07: 1000 ug via SUBCUTANEOUS

## 2015-11-07 MED ORDER — ROMIPLOSTIM 250 MCG ~~LOC~~ SOLR
1.0000 ug/kg | SUBCUTANEOUS | Status: DC
  Administered 2015-11-07: 100 ug via SUBCUTANEOUS
  Filled 2015-11-07: qty 0.2

## 2015-11-07 NOTE — Telephone Encounter (Signed)
Pt rescheduled to 11/28/15 with Dr Percival Spanish at [facility] office.

## 2015-11-07 NOTE — Patient Instructions (Signed)
Romiplostim injection What is this medicine? ROMIPLOSTIM ([place] mi PLOE stim) helps your body make more platelets. This medicine is used to treat low platelets caused by chronic idiopathic thrombocytopenic purpura (ITP). This medicine may be used for other purposes; ask your health care provider or pharmacist if you have questions. What should I tell my health care provider before I take this medicine? They need to know if you have any of these conditions: -cancer or myelodysplastic syndrome -low blood counts, like low white cell, platelet, or red cell counts -take medicines that treat or prevent blood clots -an unusual or allergic reaction to romiplostim, mannitol, other medicines, foods, dyes, or preservatives -pregnant or trying to get pregnant -breast-feeding How should I use this medicine? This medicine is for injection under the skin. It is given by a health care professional in a hospital or clinic setting. A special MedGuide will be given to you before your injection. Read this information carefully each time. Talk to your pediatrician regarding the use of this medicine in children. Special care may be needed. Overdosage: If you think you have taken too much of this medicine contact a poison control center or emergency room at once. NOTE: This medicine is only for you. Do not share this medicine with others. What if I miss a dose? It is important not to miss your dose. Call your doctor or health care professional if you are unable to keep an appointment. What may interact with this medicine? Interactions are not expected. This list may not describe all possible interactions. Give your health care provider a list of all the medicines, herbs, non-prescription drugs, or dietary supplements you use. Also tell them if you smoke, drink alcohol, or use illegal drugs. Some items may interact with your medicine. What should I watch for while using this medicine? Your condition will be monitored  carefully while you are receiving this medicine. Visit your prescriber or health care professional for regular checks on your progress and for the needed blood tests. It is important to keep all appointments. What side effects may I notice from receiving this medicine? Side effects that you should report to your doctor or health care professional as soon as possible: -allergic reactions like skin rash, itching or hives, swelling of the face, lips, or tongue -shortness of breath, chest pain, swelling in a leg -unusual bleeding or bruising Side effects that usually do not require medical attention (report to your doctor or health care professional if they continue or are bothersome): -dizziness -headache -muscle aches -pain in arms and legs -stomach pain -trouble sleeping This list may not describe all possible side effects. Call your doctor for medical advice about side effects. You may report side effects to FDA at 1-800-FDA-1088. Where should I keep my medicine? This drug is given in a hospital or clinic and will not be stored at home. NOTE: This sheet is a summary. It may not cover all possible information. If you have questions about this medicine, talk to your doctor, pharmacist, or health care provider.    2016, Elsevier/Gold Standard. (2007-10-12 15:13:04) Cyanocobalamin, Vitamin B12 injection What is this medicine? CYANOCOBALAMIN (sye an oh koe BAL a min) is a man made form of vitamin B12. Vitamin B12 is used in the growth of healthy blood cells, nerve cells, and proteins in the body. It also helps with the metabolism of fats and carbohydrates. This medicine is used to treat people who can not absorb vitamin B12. This medicine may be used for other   purposes; ask your health care provider or pharmacist if you have questions. What should I tell my health care provider before I take this medicine? They need to know if you have any of these conditions: -kidney disease -Leber's  disease -megaloblastic anemia -an unusual or allergic reaction to cyanocobalamin, cobalt, other medicines, foods, dyes, or preservatives -pregnant or trying to get pregnant -breast-feeding How should I use this medicine? This medicine is injected into a muscle or deeply under the skin. It is usually given by a health care professional in a clinic or doctor's office. However, your doctor may teach you how to inject yourself. Follow all instructions. Talk to your pediatrician regarding the use of this medicine in children. Special care may be needed. Overdosage: If you think you have taken too much of this medicine contact a poison control center or emergency room at once. NOTE: This medicine is only for you. Do not share this medicine with others. What if I miss a dose? If you are given your dose at a clinic or doctor's office, call to reschedule your appointment. If you give your own injections and you miss a dose, take it as soon as you can. If it is almost time for your next dose, take only that dose. Do not take double or extra doses. What may interact with this medicine? -colchicine -heavy alcohol intake This list may not describe all possible interactions. Give your health care provider a list of all the medicines, herbs, non-prescription drugs, or dietary supplements you use. Also tell them if you smoke, drink alcohol, or use illegal drugs. Some items may interact with your medicine. What should I watch for while using this medicine? Visit your doctor or health care professional regularly. You may need blood work done while you are taking this medicine. You may need to follow a special diet. Talk to your doctor. Limit your alcohol intake and avoid smoking to get the best benefit. What side effects may I notice from receiving this medicine? Side effects that you should report to your doctor or health care professional as soon as possible: -allergic reactions like skin rash, itching or  hives, swelling of the face, lips, or tongue -blue tint to skin -chest tightness, pain -difficulty breathing, wheezing -dizziness -red, swollen painful area on the leg Side effects that usually do not require medical attention (report to your doctor or health care professional if they continue or are bothersome): -diarrhea -headache This list may not describe all possible side effects. Call your doctor for medical advice about side effects. You may report side effects to FDA at 1-800-FDA-1088. Where should I keep my medicine? Keep out of the reach of children. Store at room temperature between 15 and 30 degrees C (59 and 85 degrees F). Protect from light. Throw away any unused medicine after the expiration date. NOTE: This sheet is a summary. It may not cover all possible information. If you have questions about this medicine, talk to your doctor, pharmacist, or health care provider.    2016, Elsevier/Gold Standard. (2007-05-25 22:10:20)  

## 2015-11-08 DIAGNOSIS — Z9181 History of falling: Secondary | ICD-10-CM | POA: Diagnosis not present

## 2015-11-08 DIAGNOSIS — R269 Unspecified abnormalities of gait and mobility: Secondary | ICD-10-CM | POA: Diagnosis not present

## 2015-11-08 DIAGNOSIS — I10 Essential (primary) hypertension: Secondary | ICD-10-CM | POA: Diagnosis not present

## 2015-11-08 DIAGNOSIS — R262 Difficulty in walking, not elsewhere classified: Secondary | ICD-10-CM | POA: Diagnosis not present

## 2015-11-09 ENCOUNTER — Ambulatory Visit: Payer: Medicare Other | Admitting: Internal Medicine

## 2015-11-09 DIAGNOSIS — E1140 Type 2 diabetes mellitus with diabetic neuropathy, unspecified: Secondary | ICD-10-CM | POA: Diagnosis not present

## 2015-11-09 DIAGNOSIS — M1991 Primary osteoarthritis, unspecified site: Secondary | ICD-10-CM | POA: Diagnosis not present

## 2015-11-10 DIAGNOSIS — I130 Hypertensive heart and chronic kidney disease with heart failure and stage 1 through stage 4 chronic kidney disease, or unspecified chronic kidney disease: Secondary | ICD-10-CM | POA: Diagnosis not present

## 2015-11-10 DIAGNOSIS — I509 Heart failure, unspecified: Secondary | ICD-10-CM | POA: Diagnosis not present

## 2015-11-14 ENCOUNTER — Other Ambulatory Visit (HOSPITAL_BASED_OUTPATIENT_CLINIC_OR_DEPARTMENT_OTHER): Payer: Medicare Other

## 2015-11-14 ENCOUNTER — Encounter: Payer: Self-pay | Admitting: Hematology

## 2015-11-14 ENCOUNTER — Ambulatory Visit (HOSPITAL_BASED_OUTPATIENT_CLINIC_OR_DEPARTMENT_OTHER): Payer: Medicare Other | Admitting: Hematology

## 2015-11-14 ENCOUNTER — Ambulatory Visit (HOSPITAL_BASED_OUTPATIENT_CLINIC_OR_DEPARTMENT_OTHER): Payer: Medicare Other

## 2015-11-14 ENCOUNTER — Telehealth: Payer: Self-pay | Admitting: Hematology

## 2015-11-14 VITALS — BP 175/65 | HR 53 | Temp 97.8°F | Resp 18 | Ht 67.0 in | Wt 220.6 lb

## 2015-11-14 DIAGNOSIS — D693 Immune thrombocytopenic purpura: Secondary | ICD-10-CM

## 2015-11-14 DIAGNOSIS — Z95828 Presence of other vascular implants and grafts: Secondary | ICD-10-CM

## 2015-11-14 DIAGNOSIS — I25119 Atherosclerotic heart disease of native coronary artery with unspecified angina pectoris: Secondary | ICD-10-CM | POA: Diagnosis not present

## 2015-11-14 LAB — CBC & DIFF AND RETIC
BASO%: 0.2 % (ref 0.0–2.0)
Basophils Absolute: 0 10*3/uL (ref 0.0–0.1)
EOS ABS: 0.1 10*3/uL (ref 0.0–0.5)
EOS%: 1.3 % (ref 0.0–7.0)
HCT: 35.1 % — ABNORMAL LOW (ref 38.4–49.9)
HEMOGLOBIN: 11.6 g/dL — AB (ref 13.0–17.1)
IMMATURE RETIC FRACT: 15.4 % — AB (ref 3.00–10.60)
LYMPH#: 1.1 10*3/uL (ref 0.9–3.3)
LYMPH%: 21.4 % (ref 14.0–49.0)
MCH: 32 pg (ref 27.2–33.4)
MCHC: 33 g/dL (ref 32.0–36.0)
MCV: 97 fL (ref 79.3–98.0)
MONO#: 0.7 10*3/uL (ref 0.1–0.9)
MONO%: 12.4 % (ref 0.0–14.0)
NEUT%: 64.7 % (ref 39.0–75.0)
NEUTROS ABS: 3.4 10*3/uL (ref 1.5–6.5)
PLATELETS: 221 10*3/uL (ref 140–400)
RBC: 3.62 10*6/uL — ABNORMAL LOW (ref 4.20–5.82)
RDW: 12.7 % (ref 11.0–14.6)
RETIC CT ABS: 75.66 10*3/uL (ref 34.80–93.90)
Retic %: 2.09 % — ABNORMAL HIGH (ref 0.80–1.80)
WBC: 5.2 10*3/uL (ref 4.0–10.3)

## 2015-11-14 LAB — COMPREHENSIVE METABOLIC PANEL
ALBUMIN: 3.2 g/dL — AB (ref 3.5–5.0)
ALK PHOS: 80 U/L (ref 40–150)
ALT: 23 U/L (ref 0–55)
AST: 25 U/L (ref 5–34)
Anion Gap: 9 mEq/L (ref 3–11)
BILIRUBIN TOTAL: 0.42 mg/dL (ref 0.20–1.20)
BUN: 17.8 mg/dL (ref 7.0–26.0)
CO2: 24 meq/L (ref 22–29)
CREATININE: 1.4 mg/dL — AB (ref 0.7–1.3)
Calcium: 9.4 mg/dL (ref 8.4–10.4)
Chloride: 108 mEq/L (ref 98–109)
EGFR: 41 mL/min/{1.73_m2} — AB (ref >90)
GLUCOSE: 181 mg/dL — AB (ref 70–140)
Potassium: 4.7 mEq/L (ref 3.5–5.1)
SODIUM: 142 meq/L (ref 136–145)
TOTAL PROTEIN: 6.6 g/dL (ref 6.4–8.3)

## 2015-11-14 MED ORDER — ROMIPLOSTIM 250 MCG ~~LOC~~ SOLR
0.5000 ug/kg | SUBCUTANEOUS | Status: DC
  Administered 2015-11-14: 50 ug via SUBCUTANEOUS
  Filled 2015-11-14: qty 0.1

## 2015-11-14 NOTE — Patient Instructions (Signed)
Romiplostim injection What is this medicine? ROMIPLOSTIM ([place] mi PLOE stim) helps your body make more platelets. This medicine is used to treat low platelets caused by chronic idiopathic thrombocytopenic purpura (ITP). This medicine may be used for other purposes; ask your health care provider or pharmacist if you have questions. What should I tell my health care provider before I take this medicine? They need to know if you have any of these conditions: -cancer or myelodysplastic syndrome -low blood counts, like low white cell, platelet, or red cell counts -take medicines that treat or prevent blood clots -an unusual or allergic reaction to romiplostim, mannitol, other medicines, foods, dyes, or preservatives -pregnant or trying to get pregnant -breast-feeding How should I use this medicine? This medicine is for injection under the skin. It is given by a health care professional in a hospital or clinic setting. A special MedGuide will be given to you before your injection. Read this information carefully each time. Talk to your pediatrician regarding the use of this medicine in children. Special care may be needed. Overdosage: If you think you have taken too much of this medicine contact a poison control center or emergency room at once. NOTE: This medicine is only for you. Do not share this medicine with others. What if I miss a dose? It is important not to miss your dose. Call your doctor or health care professional if you are unable to keep an appointment. What may interact with this medicine? Interactions are not expected. This list may not describe all possible interactions. Give your health care provider a list of all the medicines, herbs, non-prescription drugs, or dietary supplements you use. Also tell them if you smoke, drink alcohol, or use illegal drugs. Some items may interact with your medicine. What should I watch for while using this medicine? Your condition will be monitored  carefully while you are receiving this medicine. Visit your prescriber or health care professional for regular checks on your progress and for the needed blood tests. It is important to keep all appointments. What side effects may I notice from receiving this medicine? Side effects that you should report to your doctor or health care professional as soon as possible: -allergic reactions like skin rash, itching or hives, swelling of the face, lips, or tongue -shortness of breath, chest pain, swelling in a leg -unusual bleeding or bruising Side effects that usually do not require medical attention (report to your doctor or health care professional if they continue or are bothersome): -dizziness -headache -muscle aches -pain in arms and legs -stomach pain -trouble sleeping This list may not describe all possible side effects. Call your doctor for medical advice about side effects. You may report side effects to FDA at 1-800-FDA-1088. Where should I keep my medicine? This drug is given in a hospital or clinic and will not be stored at home. NOTE: This sheet is a summary. It may not cover all possible information. If you have questions about this medicine, talk to your doctor, pharmacist, or health care provider.    2016, Elsevier/Gold Standard. (2007-10-12 15:13:04) Cyanocobalamin, Vitamin B12 injection What is this medicine? CYANOCOBALAMIN (sye an oh koe BAL a min) is a man made form of vitamin B12. Vitamin B12 is used in the growth of healthy blood cells, nerve cells, and proteins in the body. It also helps with the metabolism of fats and carbohydrates. This medicine is used to treat people who can not absorb vitamin B12. This medicine may be used for other   purposes; ask your health care provider or pharmacist if you have questions. What should I tell my health care provider before I take this medicine? They need to know if you have any of these conditions: -kidney disease -Leber's  disease -megaloblastic anemia -an unusual or allergic reaction to cyanocobalamin, cobalt, other medicines, foods, dyes, or preservatives -pregnant or trying to get pregnant -breast-feeding How should I use this medicine? This medicine is injected into a muscle or deeply under the skin. It is usually given by a health care professional in a clinic or doctor's office. However, your doctor may teach you how to inject yourself. Follow all instructions. Talk to your pediatrician regarding the use of this medicine in children. Special care may be needed. Overdosage: If you think you have taken too much of this medicine contact a poison control center or emergency room at once. NOTE: This medicine is only for you. Do not share this medicine with others. What if I miss a dose? If you are given your dose at a clinic or doctor's office, call to reschedule your appointment. If you give your own injections and you miss a dose, take it as soon as you can. If it is almost time for your next dose, take only that dose. Do not take double or extra doses. What may interact with this medicine? -colchicine -heavy alcohol intake This list may not describe all possible interactions. Give your health care provider a list of all the medicines, herbs, non-prescription drugs, or dietary supplements you use. Also tell them if you smoke, drink alcohol, or use illegal drugs. Some items may interact with your medicine. What should I watch for while using this medicine? Visit your doctor or health care professional regularly. You may need blood work done while you are taking this medicine. You may need to follow a special diet. Talk to your doctor. Limit your alcohol intake and avoid smoking to get the best benefit. What side effects may I notice from receiving this medicine? Side effects that you should report to your doctor or health care professional as soon as possible: -allergic reactions like skin rash, itching or  hives, swelling of the face, lips, or tongue -blue tint to skin -chest tightness, pain -difficulty breathing, wheezing -dizziness -red, swollen painful area on the leg Side effects that usually do not require medical attention (report to your doctor or health care professional if they continue or are bothersome): -diarrhea -headache This list may not describe all possible side effects. Call your doctor for medical advice about side effects. You may report side effects to FDA at 1-800-FDA-1088. Where should I keep my medicine? Keep out of the reach of children. Store at room temperature between 15 and 30 degrees C (59 and 85 degrees F). Protect from light. Throw away any unused medicine after the expiration date. NOTE: This sheet is a summary. It may not cover all possible information. If you have questions about this medicine, talk to your doctor, pharmacist, or health care provider.    2016, Elsevier/Gold Standard. (2007-05-25 22:10:20)  

## 2015-11-14 NOTE — Telephone Encounter (Signed)
Avs report and appointment schedule given to patient, per 11/14/15 los.

## 2015-11-16 ENCOUNTER — Encounter: Payer: Self-pay | Admitting: Gastroenterology

## 2015-11-16 DIAGNOSIS — I509 Heart failure, unspecified: Secondary | ICD-10-CM | POA: Diagnosis not present

## 2015-11-16 DIAGNOSIS — I130 Hypertensive heart and chronic kidney disease with heart failure and stage 1 through stage 4 chronic kidney disease, or unspecified chronic kidney disease: Secondary | ICD-10-CM | POA: Diagnosis not present

## 2015-11-17 DIAGNOSIS — I509 Heart failure, unspecified: Secondary | ICD-10-CM | POA: Diagnosis not present

## 2015-11-17 DIAGNOSIS — I130 Hypertensive heart and chronic kidney disease with heart failure and stage 1 through stage 4 chronic kidney disease, or unspecified chronic kidney disease: Secondary | ICD-10-CM | POA: Diagnosis not present

## 2015-11-19 NOTE — Progress Notes (Signed)
.    Hematology oncology clinic note  Date of service: .11/14/2015  Patient Care Team: Hoyt Koch, MD as PCP - General (Internal Medicine)  CHIEF COMPLAINTS/PURPOSE OF CONSULTATION: Follow-up for ITP   Diagnosis: Idiopathic thrombocytopenic purpura   Current treatment: Nplate weekly to maintain reasonable platelet counts >50K. Requirement have ranged from 54mcg/kg to 4mcg/kg. Recently stable counts with 0.5-2 mcg/kg  Previous treatment: Steroids, IVIG.  HISTORY OF PRESENTING ILLNESS:  please see my previous clinic note from 09/28/2014 for details of initial presentation and course of treatment.  Interval history  Patient is here for follow-up regarding his ITP.  He is in good spirits and notes no acute new issues. His platelet counts have remained stable for a fairly decent time. He is agreeable to trying to taper off his Romiplostim.   MEDICAL HISTORY:  Past Medical History:  Diagnosis Date  . BPH (benign prostatic hypertrophy)   . Chronic renal insufficiency   . Coronary artery disease    CABG in 2000  . Diabetes type 2, controlled ([place])    Diet-controlled  . Diabetic neuropathy ([place])   . Dyslipidemia   . Hypertension   . ITP (idiopathic thrombocytopenic purpura)   . Osteoarthritis     SURGICAL HISTORY: Past Surgical History:  Procedure Laterality Date  . CHOLECYSTECTOMY    . CORONARY ARTERY BYPASS GRAFT  2000  . Right hip replacement  2014  . TONSILLECTOMY      SOCIAL HISTORY: Social History   Social History  . Marital status: Single    Spouse name: N/A  . Number of children: N/A  . Years of education: N/A   Occupational History  . Not on file.   Social History Main Topics  . Smoking status: Former Smoker    Packs/day: 2.00    Years: 25.00  . Smokeless tobacco: Never Used  . Alcohol use No  . Drug use: No  . Sexual activity: Not Currently   Other Topics Concern  . Not on file   Social History Narrative  . No narrative on file    He is living in [facility] which is an assisted living facility. One of his son is also moving to [facility] and has a Designer, jewellery in religious studies. Patient notes that his other son is a Systems analyst with multiple awards.  FAMILY HISTORY: Family History  Problem Relation Age of Onset  . Family history unknown: Yes    ALLERGIES:  is allergic to alfuzosin hcl er and penicillins.  MEDICATIONS:  Current Outpatient Prescriptions  Medication Sig Dispense Refill  . acetaminophen (TYLENOL) 500 MG tablet Take 250-1,000 mg by mouth every 6 (six) hours as needed for mild pain, moderate pain or headache.     . Ascorbic Acid (VITAMIN C) 100 MG tablet Take 100 mg by mouth daily.     . cholecalciferol (VITAMIN D) 1000 units tablet Take 1,000 Units by mouth daily.    Marland Kitchen losartan (COZAAR) 100 MG tablet Take 100 mg by mouth daily.     . magnesium chloride (SLOW-MAG) 64 MG TBEC SR tablet Take 1 tablet (64 mg total) by mouth daily. 60 tablet 1  . metoprolol tartrate (LOPRESSOR) 25 MG tablet Take 0.5 tablets (12.5 mg total) by mouth 2 (two) times daily. 90 tablet 0  . omeprazole (PRILOSEC) 20 MG capsule Take 20 mg by mouth daily.    . simvastatin (ZOCOR) 20 MG tablet Take 20 mg by mouth at bedtime.      No current  facility-administered medications for this visit.    Romiplostim 5 mcg/kg weekly   REVIEW OF SYSTEMS:   10 point review of system is negative except as noted above  PHYSICAL EXAMINATION: ECOG PERFORMANCE STATUS: 1 - Symptomatic but completely ambulatory  Vitals:   11/14/15 1042  BP: (!) 175/65  Pulse: (!) 53  Resp: 18  Temp: 97.8 F (36.6 C)   Filed Weights   11/14/15 1042  Weight: 220 lb 9.6 oz (100.1 kg)   GENERAL: Elderly gentleman in no acute distress:alert and comfortable SKIN: skin color, texture, turgor are normal, no rashes or significant lesions EYES: normal, conjunctiva are pink and non-injected, sclera clear OROPHARYNX:no exudate, no erythema and  lips, buccal mucosa, and tongue normal  NECK: supple, thyroid normal size, non-tender, without nodularity LYMPH:  no palpable lymphadenopathy in the cervical, axillary or inguinal LUNGS: Air entry bilaterally equal, No rales no rhonchi HEART: regular rate & rhythm and 2 x 6 systolic murmur over aortic area, and no lower extremity edema ABDOMEN:abdomen soft, non-tender and normal bowel sounds PSYCH: alert & oriented x 3 with fluent speech NEURO: no focal motor/sensory deficits  LABORATORY DATA:   . CBC Latest Ref Rng & Units 11/14/2015 10/31/2015 10/17/2015  WBC 4.0 - 10.3 10e3/uL 5.2 5.9 4.7  Hemoglobin 13.0 - 17.1 g/dL 11.6(L) 10.9(L) 11.5(L)  Hematocrit 38.4 - 49.9 % 35.1(L) 33.1(L) 34.8(L)  Platelets 140 - 400 10e3/uL 221 197 169    . CMP Latest Ref Rng & Units 11/14/2015 10/05/2015 09/19/2015  Glucose 70 - 140 mg/dl 181(H) 147(H) 165(H)  BUN 7.0 - 26.0 mg/dL 17.8 23(H) 17.1  Creatinine 0.7 - 1.3 mg/dL 1.4(H) 1.49(H) 1.6(H)  Sodium 136 - 145 mEq/L 142 138 142  Potassium 3.5 - 5.1 mEq/L 4.7 4.5 4.9  Chloride 101 - 111 mmol/L - 106 -  CO2 22 - 29 mEq/L 24 26 25   Calcium 8.4 - 10.4 mg/dL 9.4 9.2 9.0  Total Protein 6.4 - 8.3 g/dL 6.6 - 6.3(L)  Total Bilirubin 0.20 - 1.20 mg/dL 0.42 - 0.52  Alkaline Phos 40 - 150 U/L 80 - 63  AST 5 - 34 U/L 25 - 22  ALT 0 - 55 U/L 23 - 20    ASSESSMENT & PLAN:   82 year old Caucasian male with multiple medical comorbidities with  #1 chronic ITP since 1989. Has previously been responsive to steroids and has received IVIG on one occasion. He has been maintained on 3-60mcg/kg weekly of Nplate since mid 2979 initially with Dr. Arvin Collard at [facility] and then with Dr. Jimmie Molly at [facility] in [place].  No issues with bleeding . Ultrasound abdomen showed normal spleen size SPEP- no obvious monoclonal protein. IFE showed possibility of an restrictive bands in the IgG and Lambda lanes. Patient's platelet counts have remained remarkably  stable at Romiplostim doses of 12mcg/kg and was subsequently cut down to 0.5 mcg/kg and platelets have remained stable with this dose as well.  Plan  -we shall cut down the Nplate dose to 0.5 micrograms per kilogram. We will check his CBC weekly and I shall see him back in 2 weeks. If his platelets remain stable on the lowest dose of Nplate, we will try to hold the Nplate and monitor his platelet counts of treatment.  #2  Coronary artery disease status post CABG in year 2000. Not on aspirin due to his ITP . BNP was elevated. ECHO today shows mildly reduced systolic function and LVH  No chest pain. No shortness of breath.  #  3 chronic kidney disease - creatinine overall stable today @ 1.4. Potassium today 5 #4 mild bradycardia- heartrate was increasing appropriately with walking and no hypotension. No symptoms. Likely related to his beta blocker. Plan -low salt intake -Continue follow-up with primary care physician  RTC with Dr Irene Limbo in 2 weeks Continue weekly Nplate @ 0.0PEJ/YL and will no dose escalate unless plt <50k. weekly CBCs  Total time spent 15 minutes more than 50% time on direct patient contact counseling and coordination of care.    Sullivan Lone MD [place] Hematology/Oncology Physician [facility]  (Office):       [phone] (Work cell):  [phone] (Fax):           [phone]

## 2015-11-20 ENCOUNTER — Telehealth: Payer: Self-pay

## 2015-11-20 NOTE — Telephone Encounter (Signed)
Home Health Cert/Plan of Care received 11/10/2015 - 01/08/2016) and placed on MD's desk for signature

## 2015-11-21 ENCOUNTER — Ambulatory Visit: Payer: Medicare Other | Admitting: Cardiology

## 2015-11-21 ENCOUNTER — Other Ambulatory Visit (HOSPITAL_BASED_OUTPATIENT_CLINIC_OR_DEPARTMENT_OTHER): Payer: Medicare Other

## 2015-11-21 ENCOUNTER — Ambulatory Visit (HOSPITAL_BASED_OUTPATIENT_CLINIC_OR_DEPARTMENT_OTHER): Payer: Medicare Other

## 2015-11-21 VITALS — BP 160/65 | HR 55 | Temp 97.9°F | Resp 22

## 2015-11-21 DIAGNOSIS — I509 Heart failure, unspecified: Secondary | ICD-10-CM | POA: Diagnosis not present

## 2015-11-21 DIAGNOSIS — D693 Immune thrombocytopenic purpura: Secondary | ICD-10-CM

## 2015-11-21 DIAGNOSIS — I130 Hypertensive heart and chronic kidney disease with heart failure and stage 1 through stage 4 chronic kidney disease, or unspecified chronic kidney disease: Secondary | ICD-10-CM | POA: Diagnosis not present

## 2015-11-21 DIAGNOSIS — Z95828 Presence of other vascular implants and grafts: Secondary | ICD-10-CM

## 2015-11-21 LAB — CBC WITH DIFFERENTIAL/PLATELET
BASO%: 0.4 % (ref 0.0–2.0)
BASOS ABS: 0 10*3/uL (ref 0.0–0.1)
EOS ABS: 0.1 10*3/uL (ref 0.0–0.5)
EOS%: 1 % (ref 0.0–7.0)
HCT: 35 % — ABNORMAL LOW (ref 38.4–49.9)
HGB: 11.6 g/dL — ABNORMAL LOW (ref 13.0–17.1)
LYMPH%: 26.3 % (ref 14.0–49.0)
MCH: 32 pg (ref 27.2–33.4)
MCHC: 33.1 g/dL (ref 32.0–36.0)
MCV: 96.4 fL (ref 79.3–98.0)
MONO#: 0.7 10*3/uL (ref 0.1–0.9)
MONO%: 12.8 % (ref 0.0–14.0)
NEUT#: 3 10*3/uL (ref 1.5–6.5)
NEUT%: 59.5 % (ref 39.0–75.0)
Platelets: 191 10*3/uL (ref 140–400)
RBC: 3.63 10*6/uL — AB (ref 4.20–5.82)
RDW: 12.7 % (ref 11.0–14.6)
WBC: 5.1 10*3/uL (ref 4.0–10.3)
lymph#: 1.3 10*3/uL (ref 0.9–3.3)
nRBC: 0 % (ref 0–0)

## 2015-11-21 MED ORDER — ROMIPLOSTIM 250 MCG ~~LOC~~ SOLR
0.5000 ug/kg | SUBCUTANEOUS | Status: DC
  Administered 2015-11-21: 50 ug via SUBCUTANEOUS
  Filled 2015-11-21: qty 0.1

## 2015-11-21 NOTE — Patient Instructions (Signed)
Romiplostim injection What is this medicine? ROMIPLOSTIM ([place] mi PLOE stim) helps your body make more platelets. This medicine is used to treat low platelets caused by chronic idiopathic thrombocytopenic purpura (ITP). This medicine may be used for other purposes; ask your health care provider or pharmacist if you have questions. What should I tell my health care provider before I take this medicine? They need to know if you have any of these conditions: -cancer or myelodysplastic syndrome -low blood counts, like low white cell, platelet, or red cell counts -take medicines that treat or prevent blood clots -an unusual or allergic reaction to romiplostim, mannitol, other medicines, foods, dyes, or preservatives -pregnant or trying to get pregnant -breast-feeding How should I use this medicine? This medicine is for injection under the skin. It is given by a health care professional in a hospital or clinic setting. A special MedGuide will be given to you before your injection. Read this information carefully each time. Talk to your pediatrician regarding the use of this medicine in children. Special care may be needed. Overdosage: If you think you have taken too much of this medicine contact a poison control center or emergency room at once. NOTE: This medicine is only for you. Do not share this medicine with others. What if I miss a dose? It is important not to miss your dose. Call your doctor or health care professional if you are unable to keep an appointment. What may interact with this medicine? Interactions are not expected. This list may not describe all possible interactions. Give your health care provider a list of all the medicines, herbs, non-prescription drugs, or dietary supplements you use. Also tell them if you smoke, drink alcohol, or use illegal drugs. Some items may interact with your medicine. What should I watch for while using this medicine? Your condition will be monitored  carefully while you are receiving this medicine. Visit your prescriber or health care professional for regular checks on your progress and for the needed blood tests. It is important to keep all appointments. What side effects may I notice from receiving this medicine? Side effects that you should report to your doctor or health care professional as soon as possible: -allergic reactions like skin rash, itching or hives, swelling of the face, lips, or tongue -shortness of breath, chest pain, swelling in a leg -unusual bleeding or bruising Side effects that usually do not require medical attention (report to your doctor or health care professional if they continue or are bothersome): -dizziness -headache -muscle aches -pain in arms and legs -stomach pain -trouble sleeping This list may not describe all possible side effects. Call your doctor for medical advice about side effects. You may report side effects to FDA at 1-800-FDA-1088. Where should I keep my medicine? This drug is given in a hospital or clinic and will not be stored at home. NOTE: This sheet is a summary. It may not cover all possible information. If you have questions about this medicine, talk to your doctor, pharmacist, or health care provider.    2016, Elsevier/Gold Standard. (2007-10-12 15:13:04) Cyanocobalamin, Vitamin B12 injection What is this medicine? CYANOCOBALAMIN (sye an oh koe BAL a min) is a man made form of vitamin B12. Vitamin B12 is used in the growth of healthy blood cells, nerve cells, and proteins in the body. It also helps with the metabolism of fats and carbohydrates. This medicine is used to treat people who can not absorb vitamin B12. This medicine may be used for other   purposes; ask your health care provider or pharmacist if you have questions. What should I tell my health care provider before I take this medicine? They need to know if you have any of these conditions: -kidney disease -Leber's  disease -megaloblastic anemia -an unusual or allergic reaction to cyanocobalamin, cobalt, other medicines, foods, dyes, or preservatives -pregnant or trying to get pregnant -breast-feeding How should I use this medicine? This medicine is injected into a muscle or deeply under the skin. It is usually given by a health care professional in a clinic or doctor's office. However, your doctor may teach you how to inject yourself. Follow all instructions. Talk to your pediatrician regarding the use of this medicine in children. Special care may be needed. Overdosage: If you think you have taken too much of this medicine contact a poison control center or emergency room at once. NOTE: This medicine is only for you. Do not share this medicine with others. What if I miss a dose? If you are given your dose at a clinic or doctor's office, call to reschedule your appointment. If you give your own injections and you miss a dose, take it as soon as you can. If it is almost time for your next dose, take only that dose. Do not take double or extra doses. What may interact with this medicine? -colchicine -heavy alcohol intake This list may not describe all possible interactions. Give your health care provider a list of all the medicines, herbs, non-prescription drugs, or dietary supplements you use. Also tell them if you smoke, drink alcohol, or use illegal drugs. Some items may interact with your medicine. What should I watch for while using this medicine? Visit your doctor or health care professional regularly. You may need blood work done while you are taking this medicine. You may need to follow a special diet. Talk to your doctor. Limit your alcohol intake and avoid smoking to get the best benefit. What side effects may I notice from receiving this medicine? Side effects that you should report to your doctor or health care professional as soon as possible: -allergic reactions like skin rash, itching or  hives, swelling of the face, lips, or tongue -blue tint to skin -chest tightness, pain -difficulty breathing, wheezing -dizziness -red, swollen painful area on the leg Side effects that usually do not require medical attention (report to your doctor or health care professional if they continue or are bothersome): -diarrhea -headache This list may not describe all possible side effects. Call your doctor for medical advice about side effects. You may report side effects to FDA at 1-800-FDA-1088. Where should I keep my medicine? Keep out of the reach of children. Store at room temperature between 15 and 30 degrees C (59 and 85 degrees F). Protect from light. Throw away any unused medicine after the expiration date. NOTE: This sheet is a summary. It may not cover all possible information. If you have questions about this medicine, talk to your doctor, pharmacist, or health care provider.    2016, Elsevier/Gold Standard. (2007-05-25 22:10:20)  

## 2015-11-22 NOTE — Telephone Encounter (Signed)
Paperwork signed, faxed, copy sent to scan 

## 2015-11-28 ENCOUNTER — Ambulatory Visit (INDEPENDENT_AMBULATORY_CARE_PROVIDER_SITE_OTHER): Payer: Medicare Other | Admitting: Cardiology

## 2015-11-28 ENCOUNTER — Encounter: Payer: Self-pay | Admitting: Hematology

## 2015-11-28 ENCOUNTER — Ambulatory Visit: Payer: Medicare Other

## 2015-11-28 ENCOUNTER — Ambulatory Visit (HOSPITAL_BASED_OUTPATIENT_CLINIC_OR_DEPARTMENT_OTHER): Payer: Medicare Other | Admitting: Hematology

## 2015-11-28 ENCOUNTER — Telehealth: Payer: Self-pay

## 2015-11-28 ENCOUNTER — Other Ambulatory Visit (HOSPITAL_BASED_OUTPATIENT_CLINIC_OR_DEPARTMENT_OTHER): Payer: Medicare Other

## 2015-11-28 ENCOUNTER — Encounter: Payer: Self-pay | Admitting: Cardiology

## 2015-11-28 VITALS — BP 174/54 | HR 63 | Temp 97.4°F | Resp 16 | Ht 67.0 in | Wt 222.6 lb

## 2015-11-28 VITALS — BP 154/68 | HR 56 | Ht 67.0 in | Wt 222.0 lb

## 2015-11-28 DIAGNOSIS — D693 Immune thrombocytopenic purpura: Secondary | ICD-10-CM | POA: Diagnosis not present

## 2015-11-28 DIAGNOSIS — I2510 Atherosclerotic heart disease of native coronary artery without angina pectoris: Secondary | ICD-10-CM

## 2015-11-28 DIAGNOSIS — I25119 Atherosclerotic heart disease of native coronary artery with unspecified angina pectoris: Secondary | ICD-10-CM | POA: Diagnosis not present

## 2015-11-28 DIAGNOSIS — R001 Bradycardia, unspecified: Secondary | ICD-10-CM

## 2015-11-28 LAB — CBC WITH DIFFERENTIAL/PLATELET
BASO%: 0.2 % (ref 0.0–2.0)
BASOS ABS: 0 10*3/uL (ref 0.0–0.1)
EOS%: 1.1 % (ref 0.0–7.0)
Eosinophils Absolute: 0.1 10*3/uL (ref 0.0–0.5)
HCT: 35.5 % — ABNORMAL LOW (ref 38.4–49.9)
HEMOGLOBIN: 11.8 g/dL — AB (ref 13.0–17.1)
LYMPH#: 1.4 10*3/uL (ref 0.9–3.3)
LYMPH%: 25.6 % (ref 14.0–49.0)
MCH: 32.2 pg (ref 27.2–33.4)
MCHC: 33.2 g/dL (ref 32.0–36.0)
MCV: 96.7 fL (ref 79.3–98.0)
MONO#: 0.8 10*3/uL (ref 0.1–0.9)
MONO%: 13.8 % (ref 0.0–14.0)
NEUT#: 3.2 10*3/uL (ref 1.5–6.5)
NEUT%: 59.3 % (ref 39.0–75.0)
Platelets: 137 10*3/uL — ABNORMAL LOW (ref 140–400)
RBC: 3.67 10*6/uL — AB (ref 4.20–5.82)
RDW: 12.7 % (ref 11.0–14.6)
WBC: 5.4 10*3/uL (ref 4.0–10.3)
nRBC: 0 % (ref 0–0)

## 2015-11-28 MED ORDER — NITROGLYCERIN 0.4 MG SL SUBL: 0.4000 mg | SUBLINGUAL_TABLET | SUBLINGUAL | 3 refills | Status: DC | PRN

## 2015-11-28 NOTE — Patient Instructions (Signed)
Your physician recommends that you schedule a follow-up appointment in: As Needed    

## 2015-11-28 NOTE — Progress Notes (Signed)
Cardiology Office Note   Date:  11/28/2015   ID:  Lawrence Warner, DOB 1918-05-15, MRN [id number]  PCP:  Hoyt Koch, MD  Cardiologist:   Minus Breeding, MD    Chief Complaint  Patient presents with  . Bradycardia      History of Present Illness: Lawrence Warner is a 82 y.o. male who presents for evaluation of bradycardia and edema. He has not been seen by a cardiologist here before. He had bypass in [place] in 2000 and really hasn't seen a cardiologist over the years. He's done very well. Recently he was treated with Alfuzosin for his prostate and says he got very bradycardic and kind of out of it particularly one evening. His heart rate was in the 30s. This was several weeks ago and since has gone up into the 50s area he was also having some volume overload. He has some when necessary Lasix he was given some time ago he took this and his weight went down 5 or 6 pounds so is not had this problem. He occasionally gets some chest discomfort when lying down particularly occluding his eyedrops. This is not like his previous angina. It was suggested that he might need when necessary nitroglycerin. He actually does remarkably well and doesn't have any acute cardiac complaints. He has fatigue related to being 89. However, he gets around and does his activities of daily living without bringing on chest pressure, neck or arm discomfort. He's had no shortness of breath, PND or orthopnea. His weights are back down and his edema which is residual is mild and chronic.  Of note the only cardiac study that I see in our system is and echo in Feb which demonstrated a mildly reduced EF.    Past Medical History:  Diagnosis Date  . BPH (benign prostatic hypertrophy)   . Chronic renal insufficiency   . Coronary artery disease    CABG in 2000  . Diabetes type 2, controlled ([place])    Diet-controlled  . Diabetic neuropathy ([place])   . Dyslipidemia   . Hypertension   . ITP (idiopathic  thrombocytopenic purpura)   . Osteoarthritis     Past Surgical History:  Procedure Laterality Date  . CHOLECYSTECTOMY    . CORONARY ARTERY BYPASS GRAFT  2000  . Right hip replacement  2014  . TONSILLECTOMY       Current Outpatient Prescriptions  Medication Sig Dispense Refill  . acetaminophen (TYLENOL) 500 MG tablet Take 250-1,000 mg by mouth every 6 (six) hours as needed for mild pain, moderate pain or headache.     . Ascorbic Acid (VITAMIN C) 100 MG tablet Take 100 mg by mouth daily.     . cholecalciferol (VITAMIN D) 1000 units tablet Take 1,000 Units by mouth daily.    Marland Kitchen losartan (COZAAR) 100 MG tablet Take 100 mg by mouth daily.     . magnesium chloride (SLOW-MAG) 64 MG TBEC SR tablet Take 1 tablet (64 mg total) by mouth daily. 60 tablet 1  . metoprolol tartrate (LOPRESSOR) 25 MG tablet Take 0.5 tablets (12.5 mg total) by mouth 2 (two) times daily. 90 tablet 0  . omeprazole (PRILOSEC) 20 MG capsule Take 20 mg by mouth daily.    . simvastatin (ZOCOR) 20 MG tablet Take 20 mg by mouth at bedtime.     . nitroGLYCERIN (NITROSTAT) 0.4 MG SL tablet Place 1 tablet (0.4 mg total) under the tongue every 5 (five) minutes as needed for chest pain. 25 tablet 3  No current facility-administered medications for this visit.     Allergies:   Alfuzosin hcl er and Penicillins    Social History:  The patient  reports that he has quit smoking. He has a 50.00 pack-year smoking history. He has never used smokeless tobacco. He reports that he does not drink alcohol or use drugs.   Family History:  The patient's family history includes Cancer in his brother; Lung disease in his father.    ROS:  Please see the history of present illness.   Otherwise, review of systems are positive for none.   All other systems are reviewed and negative.    PHYSICAL EXAM: VS:  BP (!) 154/68 (BP Location: Left Arm, Patient Position: Sitting, Cuff Size: Normal)   Pulse (!) 56   Ht 5\' 7"  (1.702 m)   Wt 222 lb  (100.7 kg)   BMI 34.77 kg/m  , BMI Body mass index is 34.77 kg/m. GENERAL:  Well appearing HEENT:  Pupils equal round and reactive, fundi not visualized, oral mucosa unremarkable NECK:  No jugular venous distention, waveform within normal limits, carotid upstroke brisk and symmetric, no bruits, no thyromegaly LYMPHATICS:  No cervical, inguinal adenopathy LUNGS:  Clear to auscultation bilaterally BACK:  No CVA tenderness CHEST:  Unremarkable HEART:  PMI not displaced or sustained,S1 and S2 within normal limits, no S3, no S4, no clicks, no rubs, no murmurs ABD:  Flat, positive bowel sounds normal in frequency in pitch, no bruits, no rebound, no guarding, no midline pulsatile mass, no hepatomegaly, no splenomegaly EXT:  2 plus pulses throughout, mild right greater than left lower extremity edema, no cyanosis no clubbing SKIN:  No rashes no nodules NEURO:  Cranial nerves II through XII grossly intact, motor grossly intact throughout PSYCH:  Cognitively intact, oriented to person place and time   EKG:  EKG is not ordered today. The ekg ordered 10/05/15 demonstrates sinus bradycardia, rate 46, left axis deviation, left bundle branch block.   Recent Labs: 10/05/2015: B Natriuretic Peptide 278.4 11/14/2015: ALT 23; BUN 17.8; Creatinine 1.4; Potassium 4.7; Sodium 142 11/28/2015: HGB 11.8; Platelets 137    Lipid Panel    Component Value Date/Time   CHOL 100 05/23/2015 0919   TRIG 126 05/23/2015 0919   HDL 29 (L) 05/23/2015 0919   CHOLHDL 3.4 05/23/2015 0919   LDLCALC 46 05/23/2015 0919      Wt Readings from Last 3 Encounters:  11/28/15 222 lb (100.7 kg)  11/28/15 222 lb 9.6 oz (101 kg)  11/14/15 220 lb 9.6 oz (100.1 kg)      Other studies Reviewed: Additional studies/ records that were reviewed today include: Echo. Review of the above records demonstrates:  Please see elsewhere in the note.     ASSESSMENT AND PLAN:  BRADYCARDIA:  This seems to no longer be a problem. No change  in therapy is indicated.  EDEMA:  His ejection fraction was low previously. We discussed salt and fluid restriction. He will take when necessary Lasix I don't think any further imaging or change in therapy is indicated.  CAD:   It doesn't sound like his current chest pain is angina. Seems to be a stable pattern. It is not unreasonable for him to have some when necessary nitroglycerin however.   Current medicines are reviewed at length with the patient today.  The patient does not have concerns regarding medicines.  The following changes have been made:  no change  Labs/ tests ordered today include:  No orders of the  defined types were placed in this encounter.    Disposition:   FU with me as needed    Signed, Minus Breeding, MD  11/28/2015 6:25 PM    [place]

## 2015-11-28 NOTE — Telephone Encounter (Signed)
Appointments made,avs and calendars have been printed  Eugune Sine

## 2015-11-30 DIAGNOSIS — I509 Heart failure, unspecified: Secondary | ICD-10-CM | POA: Diagnosis not present

## 2015-11-30 DIAGNOSIS — I130 Hypertensive heart and chronic kidney disease with heart failure and stage 1 through stage 4 chronic kidney disease, or unspecified chronic kidney disease: Secondary | ICD-10-CM | POA: Diagnosis not present

## 2015-12-05 ENCOUNTER — Ambulatory Visit (HOSPITAL_BASED_OUTPATIENT_CLINIC_OR_DEPARTMENT_OTHER): Payer: Medicare Other

## 2015-12-05 ENCOUNTER — Other Ambulatory Visit: Payer: Medicare Other

## 2015-12-05 ENCOUNTER — Ambulatory Visit: Payer: Medicare Other

## 2015-12-05 ENCOUNTER — Other Ambulatory Visit (HOSPITAL_BASED_OUTPATIENT_CLINIC_OR_DEPARTMENT_OTHER): Payer: Medicare Other

## 2015-12-05 VITALS — BP 161/53 | HR 59 | Temp 98.0°F | Resp 22

## 2015-12-05 DIAGNOSIS — D693 Immune thrombocytopenic purpura: Secondary | ICD-10-CM | POA: Diagnosis present

## 2015-12-05 LAB — CBC & DIFF AND RETIC
BASO%: 0.2 % (ref 0.0–2.0)
Basophils Absolute: 0 10*3/uL (ref 0.0–0.1)
EOS%: 1.2 % (ref 0.0–7.0)
Eosinophils Absolute: 0.1 10*3/uL (ref 0.0–0.5)
HEMATOCRIT: 35.7 % — AB (ref 38.4–49.9)
HGB: 11.8 g/dL — ABNORMAL LOW (ref 13.0–17.1)
IMMATURE RETIC FRACT: 13.3 % — AB (ref 3.00–10.60)
LYMPH%: 25.6 % (ref 14.0–49.0)
MCH: 31.9 pg (ref 27.2–33.4)
MCHC: 33.1 g/dL (ref 32.0–36.0)
MCV: 96.5 fL (ref 79.3–98.0)
MONO#: 0.5 10*3/uL (ref 0.1–0.9)
MONO%: 10.1 % (ref 0.0–14.0)
NEUT%: 62.9 % (ref 39.0–75.0)
NEUTROS ABS: 3.1 10*3/uL (ref 1.5–6.5)
PLATELETS: 117 10*3/uL — AB (ref 140–400)
RBC: 3.7 10*6/uL — ABNORMAL LOW (ref 4.20–5.82)
RDW: 12.8 % (ref 11.0–14.6)
RETIC CT ABS: 71.41 10*3/uL (ref 34.80–93.90)
Retic %: 1.93 % — ABNORMAL HIGH (ref 0.80–1.80)
WBC: 5 10*3/uL (ref 4.0–10.3)
lymph#: 1.3 10*3/uL (ref 0.9–3.3)
nRBC: 0 % (ref 0–0)

## 2015-12-05 MED ORDER — CYANOCOBALAMIN 1000 MCG/ML IJ SOLN
1000.0000 ug | INTRAMUSCULAR | Status: DC
  Administered 2015-12-05: 1000 ug via SUBCUTANEOUS

## 2015-12-05 NOTE — Patient Instructions (Signed)

## 2015-12-07 ENCOUNTER — Ambulatory Visit (INDEPENDENT_AMBULATORY_CARE_PROVIDER_SITE_OTHER): Payer: Medicare Other | Admitting: Gastroenterology

## 2015-12-07 ENCOUNTER — Encounter: Payer: Self-pay | Admitting: Gastroenterology

## 2015-12-07 VITALS — BP 140/70 | HR 76 | Ht 67.0 in | Wt 220.6 lb

## 2015-12-07 DIAGNOSIS — I509 Heart failure, unspecified: Secondary | ICD-10-CM | POA: Diagnosis not present

## 2015-12-07 DIAGNOSIS — I130 Hypertensive heart and chronic kidney disease with heart failure and stage 1 through stage 4 chronic kidney disease, or unspecified chronic kidney disease: Secondary | ICD-10-CM | POA: Diagnosis not present

## 2015-12-07 DIAGNOSIS — K5900 Constipation, unspecified: Secondary | ICD-10-CM | POA: Diagnosis not present

## 2015-12-07 MED ORDER — POLYETHYLENE GLYCOL 3350 17 GM/SCOOP PO POWD: 34.0000 g | Freq: Every day | ORAL | 0 refills | Status: DC

## 2015-12-07 NOTE — Progress Notes (Signed)
12/07/2015 Lawrence Warner [id number] May 11, 1918   HISTORY OF PRESENT ILLNESS:  This is a pleasant 82 year old male who is new to our office and was referred here by his PCP, Dr. Sharlet Salina, to discuss his constipation. He tells me that in the past he used to have diarrhea with some urgency. Then after he moved here 13 months ago he started having regular bowel movements with resolution of his diarrhea. He contributes this to an extreme change in diet. Then he tells me a few months ago he started having constipation. He has been eating 5-6 prunes every morning with some resolution. He tells me that he has the sensation to move his bowels but then it is hard to get started and sometimes he only passes small amounts. He tells me that once he is able to get the first bit of stool out then he has a decent bowel movement. He says that he had a good bowel movement this morning. He tells me that he had a colonoscopy within the past 10 years although we do not have any previous GI records as they are out of state in [place].  He said that he tried MiraLAX once daily for short time but did not notice any improvement at that time.  He denies any rectal bleeding, abdominal pain, weight loss.  Past Medical History:  Diagnosis Date  . BPH (benign prostatic hypertrophy)   . Chronic renal insufficiency   . Coronary artery disease    CABG in 2000  . Diabetes type 2, controlled ([place])    Diet-controlled  . Diabetic neuropathy ([place])   . Dyslipidemia   . Hypertension   . ITP (idiopathic thrombocytopenic purpura)   . Osteoarthritis    Past Surgical History:  Procedure Laterality Date  . CHOLECYSTECTOMY    . CORONARY ARTERY BYPASS GRAFT  2000  . Right hip replacement  2014  . TONSILLECTOMY      reports that he has quit smoking. He has a 50.00 pack-year smoking history. He has never used smokeless tobacco. He reports that he does not drink alcohol or use drugs. family history  includes Cancer in his brother; Lung disease in his father. Allergies  Allergen Reactions  . Alfuzosin Hcl Er     confusion  . Penicillins Hives and Other (See Comments)    Has patient had a PCN reaction causing immediate rash, facial/tongue/throat swelling, SOB or lightheadedness with hypotension: No Has patient had a PCN reaction causing severe rash involving mucus membranes or skin necrosis: No Has patient had a PCN reaction that required hospitalization No Has patient had a PCN reaction occurring within the last 10 years: No If all of the above answers are "NO", then may proceed with Cephalosporin use.      Outpatient Encounter Prescriptions as of 12/07/2015  Medication Sig  . acetaminophen (TYLENOL) 500 MG tablet Take 250-1,000 mg by mouth every 6 (six) hours as needed for mild pain, moderate pain or headache.   . Ascorbic Acid (VITAMIN C) 100 MG tablet Take 100 mg by mouth daily.   . cholecalciferol (VITAMIN D) 1000 units tablet Take 1,000 Units by mouth daily.  Marland Kitchen losartan (COZAAR) 100 MG tablet Take 100 mg by mouth daily.   . magnesium chloride (SLOW-MAG) 64 MG TBEC SR tablet Take 1 tablet (64 mg total) by mouth daily.  . metoprolol tartrate (LOPRESSOR) 25 MG tablet Take 0.5 tablets (12.5 mg total) by mouth 2 (two) times daily.  . nitroGLYCERIN (NITROSTAT)  0.4 MG SL tablet Place 1 tablet (0.4 mg total) under the tongue every 5 (five) minutes as needed for chest pain.  Marland Kitchen omeprazole (PRILOSEC) 20 MG capsule Take 20 mg by mouth daily.  . simvastatin (ZOCOR) 20 MG tablet Take 20 mg by mouth at bedtime.    No facility-administered encounter medications on file as of 12/07/2015.      REVIEW OF SYSTEMS  : All other systems reviewed and negative except where noted in the History of Present Illness.   PHYSICAL EXAM: BP 140/70   Pulse 76   Ht 5\' 7"  (1.702 m)   Wt 220 lb 9.6 oz (100.1 kg)   SpO2 92%   BMI 34.55 kg/m  General: Well developed white male in no acute distress Head:  Normocephalic and atraumatic Eyes:  Sclerae anicteric, conjunctiva pink. Ears: Normal auditory acuity Lungs: Clear throughout to auscultation Heart: Regular rate and rhythm Abdomen: Soft, non-distended.  Normal bowel sounds.  Non-tender. Musculoskeletal: Symmetrical with no gross deformities  Skin: No lesions on visible extremities Extremities: No edema  Neurological: Alert oriented x 4, grossly non-focal  Psychological:  Alert and cooperative. Normal mood and affect  ASSESSMENT AND PLAN: -Constipation:  Will try Miralax twice daily.  I have also discussed the options of Linzess or Amitiza but he would like to try Miralax twice daily first.  CC:  Hoyt Koch, *

## 2015-12-07 NOTE — Progress Notes (Signed)
Agree with assessment and plan as outlined.  

## 2015-12-07 NOTE — Patient Instructions (Signed)
We have sent the following medications to your pharmacy for you to pick up at your convenience: Miralax 17 grams daily  Increase fluid intake

## 2015-12-11 NOTE — Progress Notes (Signed)
.    Hematology oncology clinic note  Date of service: .11/28/2015  Patient Care Team: Hoyt Koch, MD as PCP - General (Internal Medicine)  CHIEF COMPLAINTS/PURPOSE OF CONSULTATION: Follow-up for ITP   Diagnosis: Idiopathic thrombocytopenic purpura   Current treatment: Nplate weekly to maintain reasonable platelet counts >50K. Requirement have ranged from 39mcg/kg to 9mcg/kg. Recently stable counts with 0.5-2 mcg/kg  Previous treatment: Steroids, IVIG.  HISTORY OF PRESENTING ILLNESS:  please see my previous clinic note from 09/28/2014 for details of initial presentation and course of treatment.  Interval history  Patient is here for follow-up regarding his ITP.  He is in good spirits and notes no acute new issues. We are trying to see if we can get him off his Nplate since his platelets have remained stable. Platelets are somewhat lower at 137k. No issues with bleeding. No new medications.   MEDICAL HISTORY:  Past Medical History:  Diagnosis Date  . BPH (benign prostatic hypertrophy)   . Chronic renal insufficiency   . Coronary artery disease    CABG in 2000  . Diabetes type 2, controlled ([place])    Diet-controlled  . Diabetic neuropathy ([place])   . Dyslipidemia   . Hypertension   . ITP (idiopathic thrombocytopenic purpura)   . Osteoarthritis     SURGICAL HISTORY: Past Surgical History:  Procedure Laterality Date  . CHOLECYSTECTOMY    . CORONARY ARTERY BYPASS GRAFT  2000  . Right hip replacement  2014  . TONSILLECTOMY      SOCIAL HISTORY: Social History   Social History  . Marital status: Single    Spouse name: N/A  . Number of children: N/A  . Years of education: N/A   Occupational History  . Salesman    Social History Main Topics  . Smoking status: Former Smoker    Packs/day: 2.00    Years: 25.00  . Smokeless tobacco: Never Used  . Alcohol use No  . Drug use: No  . Sexual activity: Not Currently   Other Topics Concern  . Not on file    Social History Narrative   Two children.     He is living in [place] which is an assisted living facility. One of his son is also moving to [facility] and has a Designer, jewellery in religious studies. Patient notes that his other son is a Systems analyst with multiple awards.  FAMILY HISTORY: Family History  Problem Relation Age of Onset  . Lung disease Father   . Cancer Brother     ALLERGIES:  is allergic to alfuzosin hcl er and penicillins.  MEDICATIONS:  Current Outpatient Prescriptions  Medication Sig Dispense Refill  . acetaminophen (TYLENOL) 500 MG tablet Take 250-1,000 mg by mouth every 6 (six) hours as needed for mild pain, moderate pain or headache.     . Ascorbic Acid (VITAMIN C) 100 MG tablet Take 100 mg by mouth daily.     . cholecalciferol (VITAMIN D) 1000 units tablet Take 1,000 Units by mouth daily.    Marland Kitchen losartan (COZAAR) 100 MG tablet Take 100 mg by mouth daily.     . metoprolol tartrate (LOPRESSOR) 25 MG tablet Take 0.5 tablets (12.5 mg total) by mouth 2 (two) times daily. 90 tablet 0  . omeprazole (PRILOSEC) 20 MG capsule Take 20 mg by mouth daily.    . simvastatin (ZOCOR) 20 MG tablet Take 20 mg by mouth at bedtime.     . magnesium chloride (SLOW-MAG) 64 MG TBEC SR tablet Take  1 tablet (64 mg total) by mouth daily. 60 tablet 1  . nitroGLYCERIN (NITROSTAT) 0.4 MG SL tablet Place 1 tablet (0.4 mg total) under the tongue every 5 (five) minutes as needed for chest pain. 25 tablet 3  . polyethylene glycol powder (GLYCOLAX/MIRALAX) powder Take 34 g by mouth daily. 2 capfuls daily 1156 g 0   No current facility-administered medications for this visit.    Romiplostim 5 mcg/kg weekly   REVIEW OF SYSTEMS:   10 point review of system is negative except as noted above  PHYSICAL EXAMINATION: ECOG PERFORMANCE STATUS: 1 - Symptomatic but completely ambulatory  Vitals:   11/28/15 1305  BP: (!) 174/54  Pulse: 63  Resp: 16  Temp: 97.4 F (36.3 C)   Filed  Weights   11/28/15 1305  Weight: 222 lb 9.6 oz (101 kg)   GENERAL: Elderly gentleman in no acute distress:alert and comfortable SKIN: skin color, texture, turgor are normal, no rashes or significant lesions EYES: normal, conjunctiva are pink and non-injected, sclera clear OROPHARYNX:no exudate, no erythema and lips, buccal mucosa, and tongue normal  NECK: supple, thyroid normal size, non-tender, without nodularity LYMPH:  no palpable lymphadenopathy in the cervical, axillary or inguinal LUNGS: Air entry bilaterally equal, No rales no rhonchi HEART: regular rate & rhythm and 2 x 6 systolic murmur over aortic area, and no lower extremity edema ABDOMEN:abdomen soft, non-tender and normal bowel sounds PSYCH: alert & oriented x 3 with fluent speech NEURO: no focal motor/sensory deficits  LABORATORY DATA:   . CBC Latest Ref Rng & Units 11/28/2015 11/21/2015  WBC 4.0 - 10.3 10e3/uL 5.4 5.1  Hemoglobin 13.0 - 17.1 g/dL 11.8(L) 11.6(L)  Hematocrit 38.4 - 49.9 % 35.5(L) 35.0(L)  Platelets 140 - 400 10e3/uL 137(L) 191    . CMP Latest Ref Rng & Units 11/14/2015 10/05/2015 09/19/2015  Glucose 70 - 140 mg/dl 181(H) 147(H) 165(H)  BUN 7.0 - 26.0 mg/dL 17.8 23(H) 17.1  Creatinine 0.7 - 1.3 mg/dL 1.4(H) 1.49(H) 1.6(H)  Sodium 136 - 145 mEq/L 142 138 142  Potassium 3.5 - 5.1 mEq/L 4.7 4.5 4.9  Chloride 101 - 111 mmol/L - 106 -  CO2 22 - 29 mEq/L 24 26 25   Calcium 8.4 - 10.4 mg/dL 9.4 9.2 9.0  Total Protein 6.4 - 8.3 g/dL 6.6 - 6.3(L)  Total Bilirubin 0.20 - 1.20 mg/dL 0.42 - 0.52  Alkaline Phos 40 - 150 U/L 80 - 63  AST 5 - 34 U/L 25 - 22  ALT 0 - 55 U/L 23 - 20    ASSESSMENT & PLAN:   81 year old Caucasian male with multiple medical comorbidities with  #1 chronic ITP since 1989. Has previously been responsive to steroids and has received IVIG on one occasion. He has been maintained on 3-57mcg/kg weekly of Nplate since mid 4098 initially with Dr. Arvin Collard at [facility] and then with Dr. Jimmie Molly  at [facility] in [place].  No issues with bleeding . Ultrasound abdomen showed normal spleen size SPEP- no obvious monoclonal protein. IFE showed possibility of an restrictive bands in the IgG and Lambda lanes. Patient's platelet counts have remained remarkably stable at Romiplostim doses of 79mcg/kg and was subsequently cut down to 0.5 mcg/kg and platelets have remained stable with this dose as well.  Plan  -Platelets is somewhat low at 137k but no evidence of bleeding. -We shall continue to hold his Nplate to see if his platelets remain stable. -We will check his CBC weekly and I shall see  him back in 2 weeks. -If his continue to drop we will restart him on his Nplate.  #2  Coronary artery disease status post CABG in year 2000. Not on aspirin due to his ITP . BNP was elevated. ECHO today shows mildly reduced systolic function and LVH  No chest pain. No shortness of breath. Continue to management as per primary care physician  RTC with Dr Irene Limbo in 2 weeks. Continue weekly CBC  Total time spent 15 minutes more than 50% time on direct patient contact counseling and coordination of care.    Sullivan Lone MD [place] Hematology/Oncology Physician [facility]  (Office):       [phone] (Work cell):  [phone] (Fax):           [phone]

## 2015-12-12 ENCOUNTER — Encounter: Payer: Self-pay | Admitting: Hematology

## 2015-12-12 ENCOUNTER — Ambulatory Visit (HOSPITAL_BASED_OUTPATIENT_CLINIC_OR_DEPARTMENT_OTHER): Payer: Medicare Other

## 2015-12-12 ENCOUNTER — Ambulatory Visit (HOSPITAL_BASED_OUTPATIENT_CLINIC_OR_DEPARTMENT_OTHER): Payer: Medicare Other | Admitting: Hematology

## 2015-12-12 ENCOUNTER — Other Ambulatory Visit (HOSPITAL_BASED_OUTPATIENT_CLINIC_OR_DEPARTMENT_OTHER): Payer: Medicare Other

## 2015-12-12 VITALS — BP 151/48 | HR 55 | Temp 97.5°F | Resp 18 | Ht 67.0 in | Wt 221.0 lb

## 2015-12-12 DIAGNOSIS — I25119 Atherosclerotic heart disease of native coronary artery with unspecified angina pectoris: Secondary | ICD-10-CM

## 2015-12-12 DIAGNOSIS — D693 Immune thrombocytopenic purpura: Secondary | ICD-10-CM

## 2015-12-12 DIAGNOSIS — Z95828 Presence of other vascular implants and grafts: Secondary | ICD-10-CM

## 2015-12-12 DIAGNOSIS — N189 Chronic kidney disease, unspecified: Secondary | ICD-10-CM

## 2015-12-12 DIAGNOSIS — E538 Deficiency of other specified B group vitamins: Secondary | ICD-10-CM

## 2015-12-12 LAB — COMPREHENSIVE METABOLIC PANEL
ALBUMIN: 3.4 g/dL — AB (ref 3.5–5.0)
ALK PHOS: 80 U/L (ref 40–150)
ALT: 22 U/L (ref 0–55)
AST: 26 U/L (ref 5–34)
Anion Gap: 9 mEq/L (ref 3–11)
BILIRUBIN TOTAL: 0.46 mg/dL (ref 0.20–1.20)
BUN: 23 mg/dL (ref 7.0–26.0)
CO2: 23 meq/L (ref 22–29)
CREATININE: 1.6 mg/dL — AB (ref 0.7–1.3)
Calcium: 9.4 mg/dL (ref 8.4–10.4)
Chloride: 107 mEq/L (ref 98–109)
EGFR: 37 mL/min/{1.73_m2} — AB (ref >90)
GLUCOSE: 178 mg/dL — AB (ref 70–140)
Potassium: 4.6 mEq/L (ref 3.5–5.1)
SODIUM: 139 meq/L (ref 136–145)
TOTAL PROTEIN: 6.9 g/dL (ref 6.4–8.3)

## 2015-12-12 LAB — CBC & DIFF AND RETIC
BASO%: 0.2 % (ref 0.0–2.0)
Basophils Absolute: 0 10*3/uL (ref 0.0–0.1)
EOS ABS: 0.1 10*3/uL (ref 0.0–0.5)
EOS%: 1.3 % (ref 0.0–7.0)
HCT: 38 % — ABNORMAL LOW (ref 38.4–49.9)
HEMOGLOBIN: 12.7 g/dL — AB (ref 13.0–17.1)
IMMATURE RETIC FRACT: 14.8 % — AB (ref 3.00–10.60)
LYMPH#: 1.5 10*3/uL (ref 0.9–3.3)
LYMPH%: 28.2 % (ref 14.0–49.0)
MCH: 32.5 pg (ref 27.2–33.4)
MCHC: 33.4 g/dL (ref 32.0–36.0)
MCV: 97.2 fL (ref 79.3–98.0)
MONO#: 0.7 10*3/uL (ref 0.1–0.9)
MONO%: 12.2 % (ref 0.0–14.0)
NEUT%: 58.1 % (ref 39.0–75.0)
NEUTROS ABS: 3.1 10*3/uL (ref 1.5–6.5)
Platelets: 82 10*3/uL — ABNORMAL LOW (ref 140–400)
RBC: 3.91 10*6/uL — AB (ref 4.20–5.82)
RDW: 12.8 % (ref 11.0–14.6)
RETIC %: 2 % — AB (ref 0.80–1.80)
RETIC CT ABS: 78.2 10*3/uL (ref 34.80–93.90)
WBC: 5.3 10*3/uL (ref 4.0–10.3)

## 2015-12-12 MED ORDER — ROMIPLOSTIM 250 MCG ~~LOC~~ SOLR
1.0000 ug/kg | SUBCUTANEOUS | Status: DC
  Administered 2015-12-12: 100 ug via SUBCUTANEOUS
  Filled 2015-12-12: qty 0.2

## 2015-12-12 NOTE — Patient Instructions (Signed)

## 2015-12-13 ENCOUNTER — Telehealth: Payer: Self-pay | Admitting: Cardiology

## 2015-12-13 MED ORDER — NITROGLYCERIN 0.4 MG SL SUBL: 0.4000 mg | SUBLINGUAL_TABLET | SUBLINGUAL | 3 refills | Status: DC | PRN

## 2015-12-13 NOTE — Telephone Encounter (Signed)
[facility] 

## 2015-12-13 NOTE — Telephone Encounter (Signed)
Spoke w patient to see if he uses [facility]. States no, this should go to [facility] ([place] only for 100% disabled vets.)  Pt wants also to make sure that we know he is listed as "[employer] Meals" on some documents.  Patient aware I will fax Rx for nitro to [facility] office. Pt reassures me there is no hurry on this.   I have contacted [facility] and obtained pharmacy fax # of [phone]

## 2015-12-13 NOTE — Telephone Encounter (Signed)
[facility] calling to advise [place] patient is not in there system doesn't show patient ever having [employer]. Thinks he received an escript for Nitrostat by accident

## 2015-12-14 DIAGNOSIS — I130 Hypertensive heart and chronic kidney disease with heart failure and stage 1 through stage 4 chronic kidney disease, or unspecified chronic kidney disease: Secondary | ICD-10-CM | POA: Diagnosis not present

## 2015-12-14 DIAGNOSIS — I509 Heart failure, unspecified: Secondary | ICD-10-CM | POA: Diagnosis not present

## 2015-12-16 NOTE — Progress Notes (Signed)
.    Hematology oncology clinic note  Date of service: .12/12/2015  Patient Care Team: Hoyt Koch, MD as PCP - General (Internal Medicine)  CHIEF COMPLAINTS/PURPOSE OF CONSULTATION: Follow-up for ITP   Diagnosis: Idiopathic thrombocytopenic purpura   Current treatment: Nplate weekly to maintain reasonable platelet counts >50K. Requirement have ranged from 33mcg/kg to 56mcg/kg. Recently stable counts with 0.5-2 mcg/kg  Previous treatment: Steroids, IVIG.  HISTORY OF PRESENTING ILLNESS:  please see my previous clinic note from 09/28/2014 for details of initial presentation and course of treatment.  Interval history  Patient is here for follow-up regarding his ITP.  He has been off his Nplate for a few weeks and his platelet counts continue to drop down to 82k. Laboratory results were discussed with the patient. It is likely that he will continue to drop his counts without the Nplate.  MEDICAL HISTORY:  Past Medical History:  Diagnosis Date  . BPH (benign prostatic hypertrophy)   . Chronic renal insufficiency   . Coronary artery disease    CABG in 2000  . Diabetes type 2, controlled ([place])    Diet-controlled  . Diabetic neuropathy ([place])   . Dyslipidemia   . Hypertension   . ITP (idiopathic thrombocytopenic purpura)   . Osteoarthritis     SURGICAL HISTORY: Past Surgical History:  Procedure Laterality Date  . CHOLECYSTECTOMY    . CORONARY ARTERY BYPASS GRAFT  2000  . Right hip replacement  2014  . TONSILLECTOMY      SOCIAL HISTORY: Social History   Social History  . Marital status: Single    Spouse name: N/A  . Number of children: N/A  . Years of education: N/A   Occupational History  . Salesman    Social History Main Topics  . Smoking status: Former Smoker    Packs/day: 2.00    Years: 25.00  . Smokeless tobacco: Never Used  . Alcohol use No  . Drug use: No  . Sexual activity: Not Currently   Other Topics Concern  . Not on file   Social  History Narrative   Two children.     He is living in [place] which is an assisted living facility. One of his son is also moving to [facility] and has a Designer, jewellery in religious studies. Patient notes that his other son is a Systems analyst with multiple awards.  FAMILY HISTORY: Family History  Problem Relation Age of Onset  . Lung disease Father   . Cancer Brother     ALLERGIES:  is allergic to alfuzosin hcl er and penicillins.  MEDICATIONS:  Current Outpatient Prescriptions  Medication Sig Dispense Refill  . acetaminophen (TYLENOL) 500 MG tablet Take 250-1,000 mg by mouth every 6 (six) hours as needed for mild pain, moderate pain or headache.     . Ascorbic Acid (VITAMIN C) 100 MG tablet Take 100 mg by mouth daily.     . cholecalciferol (VITAMIN D) 1000 units tablet Take 1,000 Units by mouth daily.    Marland Kitchen losartan (COZAAR) 100 MG tablet Take 100 mg by mouth daily.     . magnesium chloride (SLOW-MAG) 64 MG TBEC SR tablet Take 1 tablet (64 mg total) by mouth daily. 60 tablet 1  . metoprolol tartrate (LOPRESSOR) 25 MG tablet Take 0.5 tablets (12.5 mg total) by mouth 2 (two) times daily. 90 tablet 0  . nitroGLYCERIN (NITROSTAT) 0.4 MG SL tablet Place 1 tablet (0.4 mg total) under the tongue every 5 (five) minutes as needed for  chest pain. 25 tablet 3  . omeprazole (PRILOSEC) 20 MG capsule Take 20 mg by mouth daily.    . polyethylene glycol powder (GLYCOLAX/MIRALAX) powder Take 34 g by mouth daily. 2 capfuls daily 1156 g 0  . simvastatin (ZOCOR) 20 MG tablet Take 20 mg by mouth at bedtime.      No current facility-administered medications for this visit.    Romiplostim 5 mcg/kg weekly   REVIEW OF SYSTEMS:   10 point review of system is negative except as noted above  PHYSICAL EXAMINATION: ECOG PERFORMANCE STATUS: 1 - Symptomatic but completely ambulatory  Vitals:   12/12/15 1026  BP: (!) 151/48  Pulse: (!) 55  Resp: 18  Temp: 97.5 F (36.4 C)   Filed Weights    12/12/15 1026  Weight: 221 lb (100.2 kg)   GENERAL: Elderly gentleman in no acute distress:alert and comfortable SKIN: skin color, texture, turgor are normal, no rashes or significant lesions EYES: normal, conjunctiva are pink and non-injected, sclera clear OROPHARYNX:no exudate, no erythema and lips, buccal mucosa, and tongue normal  NECK: supple, thyroid normal size, non-tender, without nodularity LYMPH:  no palpable lymphadenopathy in the cervical, axillary or inguinal LUNGS: Air entry bilaterally equal, No rales no rhonchi HEART: regular rate & rhythm and 2 x 6 systolic murmur over aortic area, and no lower extremity edema ABDOMEN:abdomen soft, non-tender and normal bowel sounds PSYCH: alert & oriented x 3 with fluent speech NEURO: no focal motor/sensory deficits  LABORATORY DATA:  . CBC Latest Ref Rng & Units 12/12/2015 12/05/2015 11/28/2015  WBC 4.0 - 10.3 10e3/uL 5.3 5.0 5.4  Hemoglobin 13.0 - 17.1 g/dL 12.7(L) 11.8(L) 11.8(L)  Hematocrit 38.4 - 49.9 % 38.0(L) 35.7(L) 35.5(L)  Platelets 140 - 400 10e3/uL 82(L) 117(L) 137(L)    CMP Latest Ref Rng & Units 12/12/2015 11/14/2015 10/05/2015  Glucose 70 - 140 mg/dl 178(H) 181(H) 147(H)  BUN 7.0 - 26.0 mg/dL 23.0 17.8 23(H)  Creatinine 0.7 - 1.3 mg/dL 1.6(H) 1.4(H) 1.49(H)  Sodium 136 - 145 mEq/L 139 142 138  Potassium 3.5 - 5.1 mEq/L 4.6 4.7 4.5  Chloride 101 - 111 mmol/L - - 106  CO2 22 - 29 mEq/L 23 24 26   Calcium 8.4 - 10.4 mg/dL 9.4 9.4 9.2  Total Protein 6.4 - 8.3 g/dL 6.9 6.6 -  Total Bilirubin 0.20 - 1.20 mg/dL 0.46 0.42 -  Alkaline Phos 40 - 150 U/L 80 80 -  AST 5 - 34 U/L 26 25 -  ALT 0 - 55 U/L 22 23 -    ASSESSMENT & PLAN:   82 year old Caucasian male with multiple medical comorbidities with  #1 chronic ITP since 1989. Has previously been responsive to steroids and has received IVIG on one occasion. He has been maintained on 3-43mcg/kg weekly of Nplate since mid 7371 initially with Dr. Arvin Collard at [facility] and then with  Dr. Jimmie Molly at [facility] in [place].  No issues with bleeding . Ultrasound abdomen showed normal spleen size SPEP- no obvious monoclonal protein. IFE showed possibility of an restrictive bands in the IgG and Lambda lanes. Patient's platelet counts have remained remarkably stable at Romiplostim doses of 59mcg/kg and was subsequently cut down to 0.5 mcg/kg and platelets have remained stable with this dose as well.  Plan  -Platelets continue to drop off the Nplate and are down to 82k. -I had an extensive discussion with the patient regarding different treatment options including considering Promacta. -Would like to continue his low-dose Nplate which would  be quite reasonable. -We shall end his trial of monitoring him off Nplate for platelet stability. -Patient will be restarted at his previous dose of Nplate at 4.9SID/XF weekly -cbc 2 weeks and if that is stable might be able to switch him to monthly cbc -continue B12 replacement monthly for B12 deficiency. #2  Coronary artery disease status post CABG in year 2000. Not on aspirin due to his ITP . BNP was elevated. ECHO today shows mildly reduced systolic function and LVH  No chest pain. No shortness of breath. #3 chronic kidney disease  -Continue to management as per primary care physician  CBC q2weeks and CMP q4weeks RTC with Dr Irene Limbo in 2 months  Total time spent 15 minutes more than 50% time on direct patient contact counseling and coordination of care.    Sullivan Lone MD [place] Hematology/Oncology Physician [facility]  (Office):       [phone] (Work cell):  [phone] (Fax):           [phone]

## 2015-12-19 ENCOUNTER — Ambulatory Visit (HOSPITAL_BASED_OUTPATIENT_CLINIC_OR_DEPARTMENT_OTHER): Payer: Medicare Other

## 2015-12-19 ENCOUNTER — Other Ambulatory Visit: Payer: Medicare Other

## 2015-12-19 ENCOUNTER — Telehealth: Payer: Self-pay | Admitting: Hematology

## 2015-12-19 VITALS — BP 158/50 | HR 50 | Temp 97.9°F | Resp 20

## 2015-12-19 DIAGNOSIS — D693 Immune thrombocytopenic purpura: Secondary | ICD-10-CM

## 2015-12-19 DIAGNOSIS — E1122 Type 2 diabetes mellitus with diabetic chronic kidney disease: Secondary | ICD-10-CM | POA: Diagnosis not present

## 2015-12-19 DIAGNOSIS — I509 Heart failure, unspecified: Secondary | ICD-10-CM | POA: Diagnosis not present

## 2015-12-19 DIAGNOSIS — E1140 Type 2 diabetes mellitus with diabetic neuropathy, unspecified: Secondary | ICD-10-CM

## 2015-12-19 DIAGNOSIS — Z87891 Personal history of nicotine dependence: Secondary | ICD-10-CM

## 2015-12-19 DIAGNOSIS — I130 Hypertensive heart and chronic kidney disease with heart failure and stage 1 through stage 4 chronic kidney disease, or unspecified chronic kidney disease: Secondary | ICD-10-CM | POA: Diagnosis not present

## 2015-12-19 DIAGNOSIS — N189 Chronic kidney disease, unspecified: Secondary | ICD-10-CM | POA: Diagnosis not present

## 2015-12-19 DIAGNOSIS — I2510 Atherosclerotic heart disease of native coronary artery without angina pectoris: Secondary | ICD-10-CM

## 2015-12-19 DIAGNOSIS — Z95828 Presence of other vascular implants and grafts: Secondary | ICD-10-CM

## 2015-12-19 DIAGNOSIS — Z9181 History of falling: Secondary | ICD-10-CM

## 2015-12-19 DIAGNOSIS — Z955 Presence of coronary angioplasty implant and graft: Secondary | ICD-10-CM

## 2015-12-19 DIAGNOSIS — I447 Left bundle-branch block, unspecified: Secondary | ICD-10-CM

## 2015-12-19 DIAGNOSIS — M1991 Primary osteoarthritis, unspecified site: Secondary | ICD-10-CM

## 2015-12-19 MED ORDER — ROMIPLOSTIM 250 MCG ~~LOC~~ SOLR
0.5000 ug/kg | SUBCUTANEOUS | Status: DC
  Administered 2015-12-19: 50 ug via SUBCUTANEOUS
  Filled 2015-12-19: qty 0.1

## 2015-12-19 NOTE — Patient Instructions (Signed)
Romiplostim injection What is this medicine? ROMIPLOSTIM ([place] mi PLOE stim) helps your body make more platelets. This medicine is used to treat low platelets caused by chronic idiopathic thrombocytopenic purpura (ITP). This medicine may be used for other purposes; ask your health care provider or pharmacist if you have questions. What should I tell my health care provider before I take this medicine? They need to know if you have any of these conditions: -cancer or myelodysplastic syndrome -low blood counts, like low white cell, platelet, or red cell counts -take medicines that treat or prevent blood clots -an unusual or allergic reaction to romiplostim, mannitol, other medicines, foods, dyes, or preservatives -pregnant or trying to get pregnant -breast-feeding How should I use this medicine? This medicine is for injection under the skin. It is given by a health care professional in a hospital or clinic setting. A special MedGuide will be given to you before your injection. Read this information carefully each time. Talk to your pediatrician regarding the use of this medicine in children. Special care may be needed. Overdosage: If you think you have taken too much of this medicine contact a poison control center or emergency room at once. NOTE: This medicine is only for you. Do not share this medicine with others. What if I miss a dose? It is important not to miss your dose. Call your doctor or health care professional if you are unable to keep an appointment. What may interact with this medicine? Interactions are not expected. This list may not describe all possible interactions. Give your health care provider a list of all the medicines, herbs, non-prescription drugs, or dietary supplements you use. Also tell them if you smoke, drink alcohol, or use illegal drugs. Some items may interact with your medicine. What should I watch for while using this medicine? Your condition will be monitored  carefully while you are receiving this medicine. Visit your prescriber or health care professional for regular checks on your progress and for the needed blood tests. It is important to keep all appointments. What side effects may I notice from receiving this medicine? Side effects that you should report to your doctor or health care professional as soon as possible: -allergic reactions like skin rash, itching or hives, swelling of the face, lips, or tongue -shortness of breath, chest pain, swelling in a leg -unusual bleeding or bruising Side effects that usually do not require medical attention (report to your doctor or health care professional if they continue or are bothersome): -dizziness -headache -muscle aches -pain in arms and legs -stomach pain -trouble sleeping This list may not describe all possible side effects. Call your doctor for medical advice about side effects. You may report side effects to FDA at 1-800-FDA-1088. Where should I keep my medicine? This drug is given in a hospital or clinic and will not be stored at home. NOTE: This sheet is a summary. It may not cover all possible information. If you have questions about this medicine, talk to your doctor, pharmacist, or health care provider.    2016, Elsevier/Gold Standard. (2007-10-12 15:13:04) Cyanocobalamin, Vitamin B12 injection What is this medicine? CYANOCOBALAMIN (sye an oh koe BAL a min) is a man made form of vitamin B12. Vitamin B12 is used in the growth of healthy blood cells, nerve cells, and proteins in the body. It also helps with the metabolism of fats and carbohydrates. This medicine is used to treat people who can not absorb vitamin B12. This medicine may be used for other   purposes; ask your health care provider or pharmacist if you have questions. What should I tell my health care provider before I take this medicine? They need to know if you have any of these conditions: -kidney disease -Leber's  disease -megaloblastic anemia -an unusual or allergic reaction to cyanocobalamin, cobalt, other medicines, foods, dyes, or preservatives -pregnant or trying to get pregnant -breast-feeding How should I use this medicine? This medicine is injected into a muscle or deeply under the skin. It is usually given by a health care professional in a clinic or doctor's office. However, your doctor may teach you how to inject yourself. Follow all instructions. Talk to your pediatrician regarding the use of this medicine in children. Special care may be needed. Overdosage: If you think you have taken too much of this medicine contact a poison control center or emergency room at once. NOTE: This medicine is only for you. Do not share this medicine with others. What if I miss a dose? If you are given your dose at a clinic or doctor's office, call to reschedule your appointment. If you give your own injections and you miss a dose, take it as soon as you can. If it is almost time for your next dose, take only that dose. Do not take double or extra doses. What may interact with this medicine? -colchicine -heavy alcohol intake This list may not describe all possible interactions. Give your health care provider a list of all the medicines, herbs, non-prescription drugs, or dietary supplements you use. Also tell them if you smoke, drink alcohol, or use illegal drugs. Some items may interact with your medicine. What should I watch for while using this medicine? Visit your doctor or health care professional regularly. You may need blood work done while you are taking this medicine. You may need to follow a special diet. Talk to your doctor. Limit your alcohol intake and avoid smoking to get the best benefit. What side effects may I notice from receiving this medicine? Side effects that you should report to your doctor or health care professional as soon as possible: -allergic reactions like skin rash, itching or  hives, swelling of the face, lips, or tongue -blue tint to skin -chest tightness, pain -difficulty breathing, wheezing -dizziness -red, swollen painful area on the leg Side effects that usually do not require medical attention (report to your doctor or health care professional if they continue or are bothersome): -diarrhea -headache This list may not describe all possible side effects. Call your doctor for medical advice about side effects. You may report side effects to FDA at 1-800-FDA-1088. Where should I keep my medicine? Keep out of the reach of children. Store at room temperature between 15 and 30 degrees C (59 and 85 degrees F). Protect from light. Throw away any unused medicine after the expiration date. NOTE: This sheet is a summary. It may not cover all possible information. If you have questions about this medicine, talk to your doctor, pharmacist, or health care provider.    2016, Elsevier/Gold Standard. (2007-05-25 22:10:20)  

## 2015-12-19 NOTE — Telephone Encounter (Signed)
AVS report and appointment schedule given to patient, per 12/19/15 los. °

## 2015-12-25 ENCOUNTER — Telehealth: Payer: Self-pay | Admitting: Hematology

## 2015-12-25 NOTE — Telephone Encounter (Signed)
NOT ABLE TO REACH PATIENT RE November/DECEMBER APPOINTMENTS. ADDED NOTE TO EXISTING 10/31 APPOINTMENT TO SEND PATIENT FOR NEW SCHEDULE.Marland Kitchen

## 2015-12-26 ENCOUNTER — Ambulatory Visit (HOSPITAL_BASED_OUTPATIENT_CLINIC_OR_DEPARTMENT_OTHER): Payer: Medicare Other

## 2015-12-26 ENCOUNTER — Other Ambulatory Visit (HOSPITAL_BASED_OUTPATIENT_CLINIC_OR_DEPARTMENT_OTHER): Payer: Medicare Other

## 2015-12-26 VITALS — BP 160/59 | HR 64 | Temp 98.0°F | Resp 20

## 2015-12-26 DIAGNOSIS — D693 Immune thrombocytopenic purpura: Secondary | ICD-10-CM

## 2015-12-26 DIAGNOSIS — Z95828 Presence of other vascular implants and grafts: Secondary | ICD-10-CM

## 2015-12-26 LAB — CBC & DIFF AND RETIC
BASO%: 0.3 % (ref 0.0–2.0)
Basophils Absolute: 0 10*3/uL (ref 0.0–0.1)
EOS ABS: 0.1 10*3/uL (ref 0.0–0.5)
EOS%: 1.2 % (ref 0.0–7.0)
HCT: 37 % — ABNORMAL LOW (ref 38.4–49.9)
HEMOGLOBIN: 12.1 g/dL — AB (ref 13.0–17.1)
IMMATURE RETIC FRACT: 9.9 % (ref 3.00–10.60)
LYMPH%: 23.9 % (ref 14.0–49.0)
MCH: 31.8 pg (ref 27.2–33.4)
MCHC: 32.7 g/dL (ref 32.0–36.0)
MCV: 97.4 fL (ref 79.3–98.0)
MONO#: 0.7 10*3/uL (ref 0.1–0.9)
MONO%: 11 % (ref 0.0–14.0)
NEUT%: 63.6 % (ref 39.0–75.0)
NEUTROS ABS: 3.7 10*3/uL (ref 1.5–6.5)
PLATELETS: 205 10*3/uL (ref 140–400)
RBC: 3.8 10*6/uL — AB (ref 4.20–5.82)
RDW: 12.9 % (ref 11.0–14.6)
Retic %: 1.71 % (ref 0.80–1.80)
Retic Ct Abs: 64.98 10*3/uL (ref 34.80–93.90)
WBC: 5.9 10*3/uL (ref 4.0–10.3)
lymph#: 1.4 10*3/uL (ref 0.9–3.3)
nRBC: 0 % (ref 0–0)

## 2015-12-26 MED ORDER — ROMIPLOSTIM 250 MCG ~~LOC~~ SOLR
0.5000 ug/kg | SUBCUTANEOUS | Status: DC
  Administered 2015-12-26: 50 ug via SUBCUTANEOUS
  Filled 2015-12-26: qty 0.1

## 2015-12-26 NOTE — Patient Instructions (Signed)
Romiplostim injection What is this medicine? ROMIPLOSTIM ([place] mi PLOE stim) helps your body make more platelets. This medicine is used to treat low platelets caused by chronic idiopathic thrombocytopenic purpura (ITP). This medicine may be used for other purposes; ask your health care provider or pharmacist if you have questions. What should I tell my health care provider before I take this medicine? They need to know if you have any of these conditions: -cancer or myelodysplastic syndrome -low blood counts, like low white cell, platelet, or red cell counts -take medicines that treat or prevent blood clots -an unusual or allergic reaction to romiplostim, mannitol, other medicines, foods, dyes, or preservatives -pregnant or trying to get pregnant -breast-feeding How should I use this medicine? This medicine is for injection under the skin. It is given by a health care professional in a hospital or clinic setting. A special MedGuide will be given to you before your injection. Read this information carefully each time. Talk to your pediatrician regarding the use of this medicine in children. Special care may be needed. Overdosage: If you think you have taken too much of this medicine contact a poison control center or emergency room at once. NOTE: This medicine is only for you. Do not share this medicine with others. What if I miss a dose? It is important not to miss your dose. Call your doctor or health care professional if you are unable to keep an appointment. What may interact with this medicine? Interactions are not expected. This list may not describe all possible interactions. Give your health care provider a list of all the medicines, herbs, non-prescription drugs, or dietary supplements you use. Also tell them if you smoke, drink alcohol, or use illegal drugs. Some items may interact with your medicine. What should I watch for while using this medicine? Your condition will be monitored  carefully while you are receiving this medicine. Visit your prescriber or health care professional for regular checks on your progress and for the needed blood tests. It is important to keep all appointments. What side effects may I notice from receiving this medicine? Side effects that you should report to your doctor or health care professional as soon as possible: -allergic reactions like skin rash, itching or hives, swelling of the face, lips, or tongue -shortness of breath, chest pain, swelling in a leg -unusual bleeding or bruising Side effects that usually do not require medical attention (report to your doctor or health care professional if they continue or are bothersome): -dizziness -headache -muscle aches -pain in arms and legs -stomach pain -trouble sleeping This list may not describe all possible side effects. Call your doctor for medical advice about side effects. You may report side effects to FDA at 1-800-FDA-1088. Where should I keep my medicine? This drug is given in a hospital or clinic and will not be stored at home. NOTE: This sheet is a summary. It may not cover all possible information. If you have questions about this medicine, talk to your doctor, pharmacist, or health care provider.    2016, Elsevier/Gold Standard. (2007-10-12 15:13:04) Cyanocobalamin, Vitamin B12 injection What is this medicine? CYANOCOBALAMIN (sye an oh koe BAL a min) is a man made form of vitamin B12. Vitamin B12 is used in the growth of healthy blood cells, nerve cells, and proteins in the body. It also helps with the metabolism of fats and carbohydrates. This medicine is used to treat people who can not absorb vitamin B12. This medicine may be used for other   purposes; ask your health care provider or pharmacist if you have questions. What should I tell my health care provider before I take this medicine? They need to know if you have any of these conditions: -kidney disease -Leber's  disease -megaloblastic anemia -an unusual or allergic reaction to cyanocobalamin, cobalt, other medicines, foods, dyes, or preservatives -pregnant or trying to get pregnant -breast-feeding How should I use this medicine? This medicine is injected into a muscle or deeply under the skin. It is usually given by a health care professional in a clinic or doctor's office. However, your doctor may teach you how to inject yourself. Follow all instructions. Talk to your pediatrician regarding the use of this medicine in children. Special care may be needed. Overdosage: If you think you have taken too much of this medicine contact a poison control center or emergency room at once. NOTE: This medicine is only for you. Do not share this medicine with others. What if I miss a dose? If you are given your dose at a clinic or doctor's office, call to reschedule your appointment. If you give your own injections and you miss a dose, take it as soon as you can. If it is almost time for your next dose, take only that dose. Do not take double or extra doses. What may interact with this medicine? -colchicine -heavy alcohol intake This list may not describe all possible interactions. Give your health care provider a list of all the medicines, herbs, non-prescription drugs, or dietary supplements you use. Also tell them if you smoke, drink alcohol, or use illegal drugs. Some items may interact with your medicine. What should I watch for while using this medicine? Visit your doctor or health care professional regularly. You may need blood work done while you are taking this medicine. You may need to follow a special diet. Talk to your doctor. Limit your alcohol intake and avoid smoking to get the best benefit. What side effects may I notice from receiving this medicine? Side effects that you should report to your doctor or health care professional as soon as possible: -allergic reactions like skin rash, itching or  hives, swelling of the face, lips, or tongue -blue tint to skin -chest tightness, pain -difficulty breathing, wheezing -dizziness -red, swollen painful area on the leg Side effects that usually do not require medical attention (report to your doctor or health care professional if they continue or are bothersome): -diarrhea -headache This list may not describe all possible side effects. Call your doctor for medical advice about side effects. You may report side effects to FDA at 1-800-FDA-1088. Where should I keep my medicine? Keep out of the reach of children. Store at room temperature between 15 and 30 degrees C (59 and 85 degrees F). Protect from light. Throw away any unused medicine after the expiration date. NOTE: This sheet is a summary. It may not cover all possible information. If you have questions about this medicine, talk to your doctor, pharmacist, or health care provider.    2016, Elsevier/Gold Standard. (2007-05-25 22:10:20)  

## 2016-01-02 ENCOUNTER — Ambulatory Visit (HOSPITAL_BASED_OUTPATIENT_CLINIC_OR_DEPARTMENT_OTHER): Payer: Medicare Other

## 2016-01-02 VITALS — BP 162/58 | HR 63 | Temp 97.5°F | Resp 20

## 2016-01-02 DIAGNOSIS — Z95828 Presence of other vascular implants and grafts: Secondary | ICD-10-CM

## 2016-01-02 DIAGNOSIS — D693 Immune thrombocytopenic purpura: Secondary | ICD-10-CM

## 2016-01-02 MED ORDER — ROMIPLOSTIM 250 MCG ~~LOC~~ SOLR
50.0000 ug | SUBCUTANEOUS | Status: DC
  Administered 2016-01-02: 50 ug via SUBCUTANEOUS
  Filled 2016-01-02: qty 0.1

## 2016-01-02 MED ORDER — CYANOCOBALAMIN 1000 MCG/ML IJ SOLN
1000.0000 ug | INTRAMUSCULAR | Status: DC
  Administered 2016-01-02: 1000 ug via SUBCUTANEOUS

## 2016-01-02 NOTE — Patient Instructions (Signed)
Romiplostim injection What is this medicine? ROMIPLOSTIM ([place] mi PLOE stim) helps your body make more platelets. This medicine is used to treat low platelets caused by chronic idiopathic thrombocytopenic purpura (ITP). This medicine may be used for other purposes; ask your health care provider or pharmacist if you have questions. What should I tell my health care provider before I take this medicine? They need to know if you have any of these conditions: -cancer or myelodysplastic syndrome -low blood counts, like low white cell, platelet, or red cell counts -take medicines that treat or prevent blood clots -an unusual or allergic reaction to romiplostim, mannitol, other medicines, foods, dyes, or preservatives -pregnant or trying to get pregnant -breast-feeding How should I use this medicine? This medicine is for injection under the skin. It is given by a health care professional in a hospital or clinic setting. A special MedGuide will be given to you before your injection. Read this information carefully each time. Talk to your pediatrician regarding the use of this medicine in children. Special care may be needed. Overdosage: If you think you have taken too much of this medicine contact a poison control center or emergency room at once. NOTE: This medicine is only for you. Do not share this medicine with others. What if I miss a dose? It is important not to miss your dose. Call your doctor or health care professional if you are unable to keep an appointment. What may interact with this medicine? Interactions are not expected. This list may not describe all possible interactions. Give your health care provider a list of all the medicines, herbs, non-prescription drugs, or dietary supplements you use. Also tell them if you smoke, drink alcohol, or use illegal drugs. Some items may interact with your medicine. What should I watch for while using this medicine? Your condition will be monitored  carefully while you are receiving this medicine. Visit your prescriber or health care professional for regular checks on your progress and for the needed blood tests. It is important to keep all appointments. What side effects may I notice from receiving this medicine? Side effects that you should report to your doctor or health care professional as soon as possible: -allergic reactions like skin rash, itching or hives, swelling of the face, lips, or tongue -shortness of breath, chest pain, swelling in a leg -unusual bleeding or bruising Side effects that usually do not require medical attention (report to your doctor or health care professional if they continue or are bothersome): -dizziness -headache -muscle aches -pain in arms and legs -stomach pain -trouble sleeping This list may not describe all possible side effects. Call your doctor for medical advice about side effects. You may report side effects to FDA at 1-800-FDA-1088. Where should I keep my medicine? This drug is given in a hospital or clinic and will not be stored at home. NOTE: This sheet is a summary. It may not cover all possible information. If you have questions about this medicine, talk to your doctor, pharmacist, or health care provider.    2016, Elsevier/Gold Standard. (2007-10-12 15:13:04) Cyanocobalamin, Vitamin B12 injection What is this medicine? CYANOCOBALAMIN (sye an oh koe BAL a min) is a man made form of vitamin B12. Vitamin B12 is used in the growth of healthy blood cells, nerve cells, and proteins in the body. It also helps with the metabolism of fats and carbohydrates. This medicine is used to treat people who can not absorb vitamin B12. This medicine may be used for other   purposes; ask your health care provider or pharmacist if you have questions. What should I tell my health care provider before I take this medicine? They need to know if you have any of these conditions: -kidney disease -Leber's  disease -megaloblastic anemia -an unusual or allergic reaction to cyanocobalamin, cobalt, other medicines, foods, dyes, or preservatives -pregnant or trying to get pregnant -breast-feeding How should I use this medicine? This medicine is injected into a muscle or deeply under the skin. It is usually given by a health care professional in a clinic or doctor's office. However, your doctor may teach you how to inject yourself. Follow all instructions. Talk to your pediatrician regarding the use of this medicine in children. Special care may be needed. Overdosage: If you think you have taken too much of this medicine contact a poison control center or emergency room at once. NOTE: This medicine is only for you. Do not share this medicine with others. What if I miss a dose? If you are given your dose at a clinic or doctor's office, call to reschedule your appointment. If you give your own injections and you miss a dose, take it as soon as you can. If it is almost time for your next dose, take only that dose. Do not take double or extra doses. What may interact with this medicine? -colchicine -heavy alcohol intake This list may not describe all possible interactions. Give your health care provider a list of all the medicines, herbs, non-prescription drugs, or dietary supplements you use. Also tell them if you smoke, drink alcohol, or use illegal drugs. Some items may interact with your medicine. What should I watch for while using this medicine? Visit your doctor or health care professional regularly. You may need blood work done while you are taking this medicine. You may need to follow a special diet. Talk to your doctor. Limit your alcohol intake and avoid smoking to get the best benefit. What side effects may I notice from receiving this medicine? Side effects that you should report to your doctor or health care professional as soon as possible: -allergic reactions like skin rash, itching or  hives, swelling of the face, lips, or tongue -blue tint to skin -chest tightness, pain -difficulty breathing, wheezing -dizziness -red, swollen painful area on the leg Side effects that usually do not require medical attention (report to your doctor or health care professional if they continue or are bothersome): -diarrhea -headache This list may not describe all possible side effects. Call your doctor for medical advice about side effects. You may report side effects to FDA at 1-800-FDA-1088. Where should I keep my medicine? Keep out of the reach of children. Store at room temperature between 15 and 30 degrees C (59 and 85 degrees F). Protect from light. Throw away any unused medicine after the expiration date. NOTE: This sheet is a summary. It may not cover all possible information. If you have questions about this medicine, talk to your doctor, pharmacist, or health care provider.    2016, Elsevier/Gold Standard. (2007-05-25 22:10:20)  

## 2016-01-04 ENCOUNTER — Ambulatory Visit: Payer: Medicare Other | Admitting: Cardiology

## 2016-01-09 ENCOUNTER — Ambulatory Visit (HOSPITAL_BASED_OUTPATIENT_CLINIC_OR_DEPARTMENT_OTHER): Payer: Medicare Other

## 2016-01-09 ENCOUNTER — Other Ambulatory Visit (HOSPITAL_BASED_OUTPATIENT_CLINIC_OR_DEPARTMENT_OTHER): Payer: Medicare Other

## 2016-01-09 VITALS — BP 170/60 | HR 58 | Temp 97.8°F | Resp 22

## 2016-01-09 DIAGNOSIS — Z95828 Presence of other vascular implants and grafts: Secondary | ICD-10-CM

## 2016-01-09 DIAGNOSIS — D693 Immune thrombocytopenic purpura: Secondary | ICD-10-CM

## 2016-01-09 LAB — CBC & DIFF AND RETIC
BASO%: 0.2 % (ref 0.0–2.0)
Basophils Absolute: 0 10*3/uL (ref 0.0–0.1)
EOS ABS: 0 10*3/uL (ref 0.0–0.5)
EOS%: 0.8 % (ref 0.0–7.0)
HCT: 36.4 % — ABNORMAL LOW (ref 38.4–49.9)
HEMOGLOBIN: 12 g/dL — AB (ref 13.0–17.1)
IMMATURE RETIC FRACT: 13.6 % — AB (ref 3.00–10.60)
LYMPH%: 25 % (ref 14.0–49.0)
MCH: 32 pg (ref 27.2–33.4)
MCHC: 33 g/dL (ref 32.0–36.0)
MCV: 97.1 fL (ref 79.3–98.0)
MONO#: 0.6 10*3/uL (ref 0.1–0.9)
MONO%: 11.4 % (ref 0.0–14.0)
NEUT%: 62.6 % (ref 39.0–75.0)
NEUTROS ABS: 3.3 10*3/uL (ref 1.5–6.5)
Platelets: 127 10*3/uL — ABNORMAL LOW (ref 140–400)
RBC: 3.75 10*6/uL — ABNORMAL LOW (ref 4.20–5.82)
RDW: 12.6 % (ref 11.0–14.6)
RETIC CT ABS: 71.25 10*3/uL (ref 34.80–93.90)
Retic %: 1.9 % — ABNORMAL HIGH (ref 0.80–1.80)
WBC: 5.3 10*3/uL (ref 4.0–10.3)
lymph#: 1.3 10*3/uL (ref 0.9–3.3)

## 2016-01-09 LAB — COMPREHENSIVE METABOLIC PANEL
ALBUMIN: 3.3 g/dL — AB (ref 3.5–5.0)
ALK PHOS: 80 U/L (ref 40–150)
ALT: 25 U/L (ref 0–55)
AST: 29 U/L (ref 5–34)
Anion Gap: 11 mEq/L (ref 3–11)
BILIRUBIN TOTAL: 0.52 mg/dL (ref 0.20–1.20)
BUN: 21.1 mg/dL (ref 7.0–26.0)
CO2: 22 mEq/L (ref 22–29)
CREATININE: 1.5 mg/dL — AB (ref 0.7–1.3)
Calcium: 9.6 mg/dL (ref 8.4–10.4)
Chloride: 107 mEq/L (ref 98–109)
EGFR: 40 mL/min/{1.73_m2} — AB (ref >90)
GLUCOSE: 175 mg/dL — AB (ref 70–140)
Potassium: 4.8 mEq/L (ref 3.5–5.1)
SODIUM: 140 meq/L (ref 136–145)
TOTAL PROTEIN: 6.6 g/dL (ref 6.4–8.3)

## 2016-01-09 MED ORDER — ROMIPLOSTIM 250 MCG ~~LOC~~ SOLR
50.0000 ug | SUBCUTANEOUS | Status: DC
  Administered 2016-01-09: 50 ug via SUBCUTANEOUS
  Filled 2016-01-09: qty 0.1

## 2016-01-09 NOTE — Patient Instructions (Signed)
Romiplostim injection What is this medicine? ROMIPLOSTIM ([place] mi PLOE stim) helps your body make more platelets. This medicine is used to treat low platelets caused by chronic idiopathic thrombocytopenic purpura (ITP). This medicine may be used for other purposes; ask your health care provider or pharmacist if you have questions. What should I tell my health care provider before I take this medicine? They need to know if you have any of these conditions: -cancer or myelodysplastic syndrome -low blood counts, like low white cell, platelet, or red cell counts -take medicines that treat or prevent blood clots -an unusual or allergic reaction to romiplostim, mannitol, other medicines, foods, dyes, or preservatives -pregnant or trying to get pregnant -breast-feeding How should I use this medicine? This medicine is for injection under the skin. It is given by a health care professional in a hospital or clinic setting. A special MedGuide will be given to you before your injection. Read this information carefully each time. Talk to your pediatrician regarding the use of this medicine in children. Special care may be needed. Overdosage: If you think you have taken too much of this medicine contact a poison control center or emergency room at once. NOTE: This medicine is only for you. Do not share this medicine with others. What if I miss a dose? It is important not to miss your dose. Call your doctor or health care professional if you are unable to keep an appointment. What may interact with this medicine? Interactions are not expected. This list may not describe all possible interactions. Give your health care provider a list of all the medicines, herbs, non-prescription drugs, or dietary supplements you use. Also tell them if you smoke, drink alcohol, or use illegal drugs. Some items may interact with your medicine. What should I watch for while using this medicine? Your condition will be monitored  carefully while you are receiving this medicine. Visit your prescriber or health care professional for regular checks on your progress and for the needed blood tests. It is important to keep all appointments. What side effects may I notice from receiving this medicine? Side effects that you should report to your doctor or health care professional as soon as possible: -allergic reactions like skin rash, itching or hives, swelling of the face, lips, or tongue -shortness of breath, chest pain, swelling in a leg -unusual bleeding or bruising Side effects that usually do not require medical attention (report to your doctor or health care professional if they continue or are bothersome): -dizziness -headache -muscle aches -pain in arms and legs -stomach pain -trouble sleeping This list may not describe all possible side effects. Call your doctor for medical advice about side effects. You may report side effects to FDA at 1-800-FDA-1088. Where should I keep my medicine? This drug is given in a hospital or clinic and will not be stored at home. NOTE: This sheet is a summary. It may not cover all possible information. If you have questions about this medicine, talk to your doctor, pharmacist, or health care provider.    2016, Elsevier/Gold Standard. (2007-10-12 15:13:04) Cyanocobalamin, Vitamin B12 injection What is this medicine? CYANOCOBALAMIN (sye an oh koe BAL a min) is a man made form of vitamin B12. Vitamin B12 is used in the growth of healthy blood cells, nerve cells, and proteins in the body. It also helps with the metabolism of fats and carbohydrates. This medicine is used to treat people who can not absorb vitamin B12. This medicine may be used for other   purposes; ask your health care provider or pharmacist if you have questions. What should I tell my health care provider before I take this medicine? They need to know if you have any of these conditions: -kidney disease -Leber's  disease -megaloblastic anemia -an unusual or allergic reaction to cyanocobalamin, cobalt, other medicines, foods, dyes, or preservatives -pregnant or trying to get pregnant -breast-feeding How should I use this medicine? This medicine is injected into a muscle or deeply under the skin. It is usually given by a health care professional in a clinic or doctor's office. However, your doctor may teach you how to inject yourself. Follow all instructions. Talk to your pediatrician regarding the use of this medicine in children. Special care may be needed. Overdosage: If you think you have taken too much of this medicine contact a poison control center or emergency room at once. NOTE: This medicine is only for you. Do not share this medicine with others. What if I miss a dose? If you are given your dose at a clinic or doctor's office, call to reschedule your appointment. If you give your own injections and you miss a dose, take it as soon as you can. If it is almost time for your next dose, take only that dose. Do not take double or extra doses. What may interact with this medicine? -colchicine -heavy alcohol intake This list may not describe all possible interactions. Give your health care provider a list of all the medicines, herbs, non-prescription drugs, or dietary supplements you use. Also tell them if you smoke, drink alcohol, or use illegal drugs. Some items may interact with your medicine. What should I watch for while using this medicine? Visit your doctor or health care professional regularly. You may need blood work done while you are taking this medicine. You may need to follow a special diet. Talk to your doctor. Limit your alcohol intake and avoid smoking to get the best benefit. What side effects may I notice from receiving this medicine? Side effects that you should report to your doctor or health care professional as soon as possible: -allergic reactions like skin rash, itching or  hives, swelling of the face, lips, or tongue -blue tint to skin -chest tightness, pain -difficulty breathing, wheezing -dizziness -red, swollen painful area on the leg Side effects that usually do not require medical attention (report to your doctor or health care professional if they continue or are bothersome): -diarrhea -headache This list may not describe all possible side effects. Call your doctor for medical advice about side effects. You may report side effects to FDA at 1-800-FDA-1088. Where should I keep my medicine? Keep out of the reach of children. Store at room temperature between 15 and 30 degrees C (59 and 85 degrees F). Protect from light. Throw away any unused medicine after the expiration date. NOTE: This sheet is a summary. It may not cover all possible information. If you have questions about this medicine, talk to your doctor, pharmacist, or health care provider.    2016, Elsevier/Gold Standard. (2007-05-25 22:10:20)  

## 2016-01-11 ENCOUNTER — Other Ambulatory Visit: Payer: Self-pay | Admitting: Urology

## 2016-01-11 DIAGNOSIS — R351 Nocturia: Secondary | ICD-10-CM | POA: Diagnosis not present

## 2016-01-11 DIAGNOSIS — N401 Enlarged prostate with lower urinary tract symptoms: Secondary | ICD-10-CM | POA: Diagnosis not present

## 2016-01-15 ENCOUNTER — Other Ambulatory Visit: Payer: Self-pay | Admitting: Internal Medicine

## 2016-01-16 ENCOUNTER — Ambulatory Visit (HOSPITAL_BASED_OUTPATIENT_CLINIC_OR_DEPARTMENT_OTHER): Payer: Medicare Other

## 2016-01-16 VITALS — BP 154/51 | HR 60 | Temp 97.7°F | Resp 20

## 2016-01-16 DIAGNOSIS — D693 Immune thrombocytopenic purpura: Secondary | ICD-10-CM

## 2016-01-16 DIAGNOSIS — Z95828 Presence of other vascular implants and grafts: Secondary | ICD-10-CM

## 2016-01-16 MED ORDER — ROMIPLOSTIM 250 MCG ~~LOC~~ SOLR
50.0000 ug | SUBCUTANEOUS | Status: DC
  Administered 2016-01-16: 50 ug via SUBCUTANEOUS
  Filled 2016-01-16: qty 0.1

## 2016-01-16 NOTE — Patient Instructions (Signed)
Romiplostim injection What is this medicine? ROMIPLOSTIM ([place] mi PLOE stim) helps your body make more platelets. This medicine is used to treat low platelets caused by chronic idiopathic thrombocytopenic purpura (ITP). This medicine may be used for other purposes; ask your health care provider or pharmacist if you have questions. What should I tell my health care provider before I take this medicine? They need to know if you have any of these conditions: -cancer or myelodysplastic syndrome -low blood counts, like low white cell, platelet, or red cell counts -take medicines that treat or prevent blood clots -an unusual or allergic reaction to romiplostim, mannitol, other medicines, foods, dyes, or preservatives -pregnant or trying to get pregnant -breast-feeding How should I use this medicine? This medicine is for injection under the skin. It is given by a health care professional in a hospital or clinic setting. A special MedGuide will be given to you before your injection. Read this information carefully each time. Talk to your pediatrician regarding the use of this medicine in children. Special care may be needed. Overdosage: If you think you have taken too much of this medicine contact a poison control center or emergency room at once. NOTE: This medicine is only for you. Do not share this medicine with others. What if I miss a dose? It is important not to miss your dose. Call your doctor or health care professional if you are unable to keep an appointment. What may interact with this medicine? Interactions are not expected. This list may not describe all possible interactions. Give your health care provider a list of all the medicines, herbs, non-prescription drugs, or dietary supplements you use. Also tell them if you smoke, drink alcohol, or use illegal drugs. Some items may interact with your medicine. What should I watch for while using this medicine? Your condition will be monitored  carefully while you are receiving this medicine. Visit your prescriber or health care professional for regular checks on your progress and for the needed blood tests. It is important to keep all appointments. What side effects may I notice from receiving this medicine? Side effects that you should report to your doctor or health care professional as soon as possible: -allergic reactions like skin rash, itching or hives, swelling of the face, lips, or tongue -shortness of breath, chest pain, swelling in a leg -unusual bleeding or bruising Side effects that usually do not require medical attention (report to your doctor or health care professional if they continue or are bothersome): -dizziness -headache -muscle aches -pain in arms and legs -stomach pain -trouble sleeping This list may not describe all possible side effects. Call your doctor for medical advice about side effects. You may report side effects to FDA at 1-800-FDA-1088. Where should I keep my medicine? This drug is given in a hospital or clinic and will not be stored at home. NOTE: This sheet is a summary. It may not cover all possible information. If you have questions about this medicine, talk to your doctor, pharmacist, or health care provider.    2016, Elsevier/Gold Standard. (2007-10-12 15:13:04) Cyanocobalamin, Vitamin B12 injection What is this medicine? CYANOCOBALAMIN (sye an oh koe BAL a min) is a man made form of vitamin B12. Vitamin B12 is used in the growth of healthy blood cells, nerve cells, and proteins in the body. It also helps with the metabolism of fats and carbohydrates. This medicine is used to treat people who can not absorb vitamin B12. This medicine may be used for other   purposes; ask your health care provider or pharmacist if you have questions. What should I tell my health care provider before I take this medicine? They need to know if you have any of these conditions: -kidney disease -Leber's  disease -megaloblastic anemia -an unusual or allergic reaction to cyanocobalamin, cobalt, other medicines, foods, dyes, or preservatives -pregnant or trying to get pregnant -breast-feeding How should I use this medicine? This medicine is injected into a muscle or deeply under the skin. It is usually given by a health care professional in a clinic or doctor's office. However, your doctor may teach you how to inject yourself. Follow all instructions. Talk to your pediatrician regarding the use of this medicine in children. Special care may be needed. Overdosage: If you think you have taken too much of this medicine contact a poison control center or emergency room at once. NOTE: This medicine is only for you. Do not share this medicine with others. What if I miss a dose? If you are given your dose at a clinic or doctor's office, call to reschedule your appointment. If you give your own injections and you miss a dose, take it as soon as you can. If it is almost time for your next dose, take only that dose. Do not take double or extra doses. What may interact with this medicine? -colchicine -heavy alcohol intake This list may not describe all possible interactions. Give your health care provider a list of all the medicines, herbs, non-prescription drugs, or dietary supplements you use. Also tell them if you smoke, drink alcohol, or use illegal drugs. Some items may interact with your medicine. What should I watch for while using this medicine? Visit your doctor or health care professional regularly. You may need blood work done while you are taking this medicine. You may need to follow a special diet. Talk to your doctor. Limit your alcohol intake and avoid smoking to get the best benefit. What side effects may I notice from receiving this medicine? Side effects that you should report to your doctor or health care professional as soon as possible: -allergic reactions like skin rash, itching or  hives, swelling of the face, lips, or tongue -blue tint to skin -chest tightness, pain -difficulty breathing, wheezing -dizziness -red, swollen painful area on the leg Side effects that usually do not require medical attention (report to your doctor or health care professional if they continue or are bothersome): -diarrhea -headache This list may not describe all possible side effects. Call your doctor for medical advice about side effects. You may report side effects to FDA at 1-800-FDA-1088. Where should I keep my medicine? Keep out of the reach of children. Store at room temperature between 15 and 30 degrees C (59 and 85 degrees F). Protect from light. Throw away any unused medicine after the expiration date. NOTE: This sheet is a summary. It may not cover all possible information. If you have questions about this medicine, talk to your doctor, pharmacist, or health care provider.    2016, Elsevier/Gold Standard. (2007-05-25 22:10:20)  

## 2016-01-23 ENCOUNTER — Ambulatory Visit (HOSPITAL_BASED_OUTPATIENT_CLINIC_OR_DEPARTMENT_OTHER): Payer: Medicare Other

## 2016-01-23 ENCOUNTER — Other Ambulatory Visit (HOSPITAL_BASED_OUTPATIENT_CLINIC_OR_DEPARTMENT_OTHER): Payer: Medicare Other

## 2016-01-23 VITALS — BP 151/60 | HR 55 | Temp 97.9°F | Resp 18

## 2016-01-23 DIAGNOSIS — D693 Immune thrombocytopenic purpura: Secondary | ICD-10-CM | POA: Diagnosis present

## 2016-01-23 DIAGNOSIS — Z95828 Presence of other vascular implants and grafts: Secondary | ICD-10-CM

## 2016-01-23 LAB — CBC & DIFF AND RETIC
BASO%: 0.2 % (ref 0.0–2.0)
Basophils Absolute: 0 10*3/uL (ref 0.0–0.1)
EOS ABS: 0.1 10*3/uL (ref 0.0–0.5)
EOS%: 1.2 % (ref 0.0–7.0)
HCT: 36.5 % — ABNORMAL LOW (ref 38.4–49.9)
HGB: 12.2 g/dL — ABNORMAL LOW (ref 13.0–17.1)
Immature Retic Fract: 13.6 % — ABNORMAL HIGH (ref 3.00–10.60)
LYMPH%: 22.6 % (ref 14.0–49.0)
MCH: 31.9 pg (ref 27.2–33.4)
MCHC: 33.4 g/dL (ref 32.0–36.0)
MCV: 95.5 fL (ref 79.3–98.0)
MONO#: 0.7 10*3/uL (ref 0.1–0.9)
MONO%: 12.6 % (ref 0.0–14.0)
NEUT%: 63.4 % (ref 39.0–75.0)
NEUTROS ABS: 3.6 10*3/uL (ref 1.5–6.5)
NRBC: 0 % (ref 0–0)
Platelets: 155 10*3/uL (ref 140–400)
RBC: 3.82 10*6/uL — AB (ref 4.20–5.82)
RDW: 12.6 % (ref 11.0–14.6)
Retic %: 1.65 % (ref 0.80–1.80)
Retic Ct Abs: 63.03 10*3/uL (ref 34.80–93.90)
WBC: 5.6 10*3/uL (ref 4.0–10.3)
lymph#: 1.3 10*3/uL (ref 0.9–3.3)

## 2016-01-23 MED ORDER — ROMIPLOSTIM 250 MCG ~~LOC~~ SOLR
50.0000 ug | SUBCUTANEOUS | Status: DC
  Administered 2016-01-23: 50 ug via SUBCUTANEOUS
  Filled 2016-01-23: qty 0.1

## 2016-01-23 NOTE — Patient Instructions (Signed)
Romiplostim injection What is this medicine? ROMIPLOSTIM ([place] mi PLOE stim) helps your body make more platelets. This medicine is used to treat low platelets caused by chronic idiopathic thrombocytopenic purpura (ITP). This medicine may be used for other purposes; ask your health care provider or pharmacist if you have questions. What should I tell my health care provider before I take this medicine? They need to know if you have any of these conditions: -cancer or myelodysplastic syndrome -low blood counts, like low white cell, platelet, or red cell counts -take medicines that treat or prevent blood clots -an unusual or allergic reaction to romiplostim, mannitol, other medicines, foods, dyes, or preservatives -pregnant or trying to get pregnant -breast-feeding How should I use this medicine? This medicine is for injection under the skin. It is given by a health care professional in a hospital or clinic setting. A special MedGuide will be given to you before your injection. Read this information carefully each time. Talk to your pediatrician regarding the use of this medicine in children. Special care may be needed. Overdosage: If you think you have taken too much of this medicine contact a poison control center or emergency room at once. NOTE: This medicine is only for you. Do not share this medicine with others. What if I miss a dose? It is important not to miss your dose. Call your doctor or health care professional if you are unable to keep an appointment. What may interact with this medicine? Interactions are not expected. This list may not describe all possible interactions. Give your health care provider a list of all the medicines, herbs, non-prescription drugs, or dietary supplements you use. Also tell them if you smoke, drink alcohol, or use illegal drugs. Some items may interact with your medicine. What should I watch for while using this medicine? Your condition will be monitored  carefully while you are receiving this medicine. Visit your prescriber or health care professional for regular checks on your progress and for the needed blood tests. It is important to keep all appointments. What side effects may I notice from receiving this medicine? Side effects that you should report to your doctor or health care professional as soon as possible: -allergic reactions like skin rash, itching or hives, swelling of the face, lips, or tongue -shortness of breath, chest pain, swelling in a leg -unusual bleeding or bruising Side effects that usually do not require medical attention (report to your doctor or health care professional if they continue or are bothersome): -dizziness -headache -muscle aches -pain in arms and legs -stomach pain -trouble sleeping This list may not describe all possible side effects. Call your doctor for medical advice about side effects. You may report side effects to FDA at 1-800-FDA-1088. Where should I keep my medicine? This drug is given in a hospital or clinic and will not be stored at home. NOTE: This sheet is a summary. It may not cover all possible information. If you have questions about this medicine, talk to your doctor, pharmacist, or health care provider.    2016, Elsevier/Gold Standard. (2007-10-12 15:13:04) Cyanocobalamin, Vitamin B12 injection What is this medicine? CYANOCOBALAMIN (sye an oh koe BAL a min) is a man made form of vitamin B12. Vitamin B12 is used in the growth of healthy blood cells, nerve cells, and proteins in the body. It also helps with the metabolism of fats and carbohydrates. This medicine is used to treat people who can not absorb vitamin B12. This medicine may be used for other   purposes; ask your health care provider or pharmacist if you have questions. What should I tell my health care provider before I take this medicine? They need to know if you have any of these conditions: -kidney disease -Leber's  disease -megaloblastic anemia -an unusual or allergic reaction to cyanocobalamin, cobalt, other medicines, foods, dyes, or preservatives -pregnant or trying to get pregnant -breast-feeding How should I use this medicine? This medicine is injected into a muscle or deeply under the skin. It is usually given by a health care professional in a clinic or doctor's office. However, your doctor may teach you how to inject yourself. Follow all instructions. Talk to your pediatrician regarding the use of this medicine in children. Special care may be needed. Overdosage: If you think you have taken too much of this medicine contact a poison control center or emergency room at once. NOTE: This medicine is only for you. Do not share this medicine with others. What if I miss a dose? If you are given your dose at a clinic or doctor's office, call to reschedule your appointment. If you give your own injections and you miss a dose, take it as soon as you can. If it is almost time for your next dose, take only that dose. Do not take double or extra doses. What may interact with this medicine? -colchicine -heavy alcohol intake This list may not describe all possible interactions. Give your health care provider a list of all the medicines, herbs, non-prescription drugs, or dietary supplements you use. Also tell them if you smoke, drink alcohol, or use illegal drugs. Some items may interact with your medicine. What should I watch for while using this medicine? Visit your doctor or health care professional regularly. You may need blood work done while you are taking this medicine. You may need to follow a special diet. Talk to your doctor. Limit your alcohol intake and avoid smoking to get the best benefit. What side effects may I notice from receiving this medicine? Side effects that you should report to your doctor or health care professional as soon as possible: -allergic reactions like skin rash, itching or  hives, swelling of the face, lips, or tongue -blue tint to skin -chest tightness, pain -difficulty breathing, wheezing -dizziness -red, swollen painful area on the leg Side effects that usually do not require medical attention (report to your doctor or health care professional if they continue or are bothersome): -diarrhea -headache This list may not describe all possible side effects. Call your doctor for medical advice about side effects. You may report side effects to FDA at 1-800-FDA-1088. Where should I keep my medicine? Keep out of the reach of children. Store at room temperature between 15 and 30 degrees C (59 and 85 degrees F). Protect from light. Throw away any unused medicine after the expiration date. NOTE: This sheet is a summary. It may not cover all possible information. If you have questions about this medicine, talk to your doctor, pharmacist, or health care provider.    2016, Elsevier/Gold Standard. (2007-05-25 22:10:20)  

## 2016-01-24 ENCOUNTER — Other Ambulatory Visit: Payer: Self-pay | Admitting: Urology

## 2016-01-30 ENCOUNTER — Ambulatory Visit (HOSPITAL_BASED_OUTPATIENT_CLINIC_OR_DEPARTMENT_OTHER): Payer: Medicare Other

## 2016-01-30 VITALS — BP 154/50 | HR 71 | Temp 97.6°F | Resp 20

## 2016-01-30 DIAGNOSIS — D693 Immune thrombocytopenic purpura: Secondary | ICD-10-CM

## 2016-01-30 DIAGNOSIS — Z95828 Presence of other vascular implants and grafts: Secondary | ICD-10-CM

## 2016-01-30 MED ORDER — ROMIPLOSTIM 250 MCG ~~LOC~~ SOLR
50.0000 ug | SUBCUTANEOUS | Status: DC
  Administered 2016-01-30: 50 ug via SUBCUTANEOUS
  Filled 2016-01-30: qty 0.1

## 2016-01-30 MED ORDER — CYANOCOBALAMIN 1000 MCG/ML IJ SOLN
1000.0000 ug | INTRAMUSCULAR | Status: DC
  Administered 2016-01-30: 1000 ug via SUBCUTANEOUS

## 2016-01-30 NOTE — Patient Instructions (Signed)
Romiplostim injection What is this medicine? ROMIPLOSTIM ([place] mi PLOE stim) helps your body make more platelets. This medicine is used to treat low platelets caused by chronic idiopathic thrombocytopenic purpura (ITP). This medicine may be used for other purposes; ask your health care provider or pharmacist if you have questions. What should I tell my health care provider before I take this medicine? They need to know if you have any of these conditions: -cancer or myelodysplastic syndrome -low blood counts, like low white cell, platelet, or red cell counts -take medicines that treat or prevent blood clots -an unusual or allergic reaction to romiplostim, mannitol, other medicines, foods, dyes, or preservatives -pregnant or trying to get pregnant -breast-feeding How should I use this medicine? This medicine is for injection under the skin. It is given by a health care professional in a hospital or clinic setting. A special MedGuide will be given to you before your injection. Read this information carefully each time. Talk to your pediatrician regarding the use of this medicine in children. Special care may be needed. Overdosage: If you think you have taken too much of this medicine contact a poison control center or emergency room at once. NOTE: This medicine is only for you. Do not share this medicine with others. What if I miss a dose? It is important not to miss your dose. Call your doctor or health care professional if you are unable to keep an appointment. What may interact with this medicine? Interactions are not expected. This list may not describe all possible interactions. Give your health care provider a list of all the medicines, herbs, non-prescription drugs, or dietary supplements you use. Also tell them if you smoke, drink alcohol, or use illegal drugs. Some items may interact with your medicine. What should I watch for while using this medicine? Your condition will be monitored  carefully while you are receiving this medicine. Visit your prescriber or health care professional for regular checks on your progress and for the needed blood tests. It is important to keep all appointments. What side effects may I notice from receiving this medicine? Side effects that you should report to your doctor or health care professional as soon as possible: -allergic reactions like skin rash, itching or hives, swelling of the face, lips, or tongue -shortness of breath, chest pain, swelling in a leg -unusual bleeding or bruising Side effects that usually do not require medical attention (report to your doctor or health care professional if they continue or are bothersome): -dizziness -headache -muscle aches -pain in arms and legs -stomach pain -trouble sleeping This list may not describe all possible side effects. Call your doctor for medical advice about side effects. You may report side effects to FDA at 1-800-FDA-1088. Where should I keep my medicine? This drug is given in a hospital or clinic and will not be stored at home. NOTE: This sheet is a summary. It may not cover all possible information. If you have questions about this medicine, talk to your doctor, pharmacist, or health care provider.    2016, Elsevier/Gold Standard. (2007-10-12 15:13:04) Cyanocobalamin, Vitamin B12 injection What is this medicine? CYANOCOBALAMIN (sye an oh koe BAL a min) is a man made form of vitamin B12. Vitamin B12 is used in the growth of healthy blood cells, nerve cells, and proteins in the body. It also helps with the metabolism of fats and carbohydrates. This medicine is used to treat people who can not absorb vitamin B12. This medicine may be used for other   purposes; ask your health care provider or pharmacist if you have questions. What should I tell my health care provider before I take this medicine? They need to know if you have any of these conditions: -kidney disease -Leber's  disease -megaloblastic anemia -an unusual or allergic reaction to cyanocobalamin, cobalt, other medicines, foods, dyes, or preservatives -pregnant or trying to get pregnant -breast-feeding How should I use this medicine? This medicine is injected into a muscle or deeply under the skin. It is usually given by a health care professional in a clinic or doctor's office. However, your doctor may teach you how to inject yourself. Follow all instructions. Talk to your pediatrician regarding the use of this medicine in children. Special care may be needed. Overdosage: If you think you have taken too much of this medicine contact a poison control center or emergency room at once. NOTE: This medicine is only for you. Do not share this medicine with others. What if I miss a dose? If you are given your dose at a clinic or doctor's office, call to reschedule your appointment. If you give your own injections and you miss a dose, take it as soon as you can. If it is almost time for your next dose, take only that dose. Do not take double or extra doses. What may interact with this medicine? -colchicine -heavy alcohol intake This list may not describe all possible interactions. Give your health care provider a list of all the medicines, herbs, non-prescription drugs, or dietary supplements you use. Also tell them if you smoke, drink alcohol, or use illegal drugs. Some items may interact with your medicine. What should I watch for while using this medicine? Visit your doctor or health care professional regularly. You may need blood work done while you are taking this medicine. You may need to follow a special diet. Talk to your doctor. Limit your alcohol intake and avoid smoking to get the best benefit. What side effects may I notice from receiving this medicine? Side effects that you should report to your doctor or health care professional as soon as possible: -allergic reactions like skin rash, itching or  hives, swelling of the face, lips, or tongue -blue tint to skin -chest tightness, pain -difficulty breathing, wheezing -dizziness -red, swollen painful area on the leg Side effects that usually do not require medical attention (report to your doctor or health care professional if they continue or are bothersome): -diarrhea -headache This list may not describe all possible side effects. Call your doctor for medical advice about side effects. You may report side effects to FDA at 1-800-FDA-1088. Where should I keep my medicine? Keep out of the reach of children. Store at room temperature between 15 and 30 degrees C (59 and 85 degrees F). Protect from light. Throw away any unused medicine after the expiration date. NOTE: This sheet is a summary. It may not cover all possible information. If you have questions about this medicine, talk to your doctor, pharmacist, or health care provider.    2016, Elsevier/Gold Standard. (2007-05-25 22:10:20)  

## 2016-02-06 ENCOUNTER — Other Ambulatory Visit (HOSPITAL_BASED_OUTPATIENT_CLINIC_OR_DEPARTMENT_OTHER): Payer: Medicare Other

## 2016-02-06 ENCOUNTER — Ambulatory Visit (HOSPITAL_BASED_OUTPATIENT_CLINIC_OR_DEPARTMENT_OTHER): Payer: Medicare Other

## 2016-02-06 ENCOUNTER — Telehealth: Payer: Self-pay | Admitting: *Deleted

## 2016-02-06 VITALS — BP 165/57 | HR 74 | Temp 97.5°F | Resp 18

## 2016-02-06 DIAGNOSIS — D693 Immune thrombocytopenic purpura: Secondary | ICD-10-CM

## 2016-02-06 DIAGNOSIS — Z95828 Presence of other vascular implants and grafts: Secondary | ICD-10-CM

## 2016-02-06 LAB — COMPREHENSIVE METABOLIC PANEL
ALT: 27 U/L (ref 0–55)
AST: 30 U/L (ref 5–34)
Albumin: 3.3 g/dL — ABNORMAL LOW (ref 3.5–5.0)
Alkaline Phosphatase: 87 U/L (ref 40–150)
Anion Gap: 8 mEq/L (ref 3–11)
BUN: 18.7 mg/dL (ref 7.0–26.0)
CHLORIDE: 104 meq/L (ref 98–109)
CO2: 24 meq/L (ref 22–29)
CREATININE: 1.4 mg/dL — AB (ref 0.7–1.3)
Calcium: 9.4 mg/dL (ref 8.4–10.4)
EGFR: 43 mL/min/{1.73_m2} — ABNORMAL LOW (ref >90)
GLUCOSE: 153 mg/dL — AB (ref 70–140)
Potassium: 4.6 mEq/L (ref 3.5–5.1)
SODIUM: 137 meq/L (ref 136–145)
TOTAL PROTEIN: 6.7 g/dL (ref 6.4–8.3)
Total Bilirubin: 0.48 mg/dL (ref 0.20–1.20)

## 2016-02-06 LAB — CBC & DIFF AND RETIC
BASO%: 0.1 % (ref 0.0–2.0)
Basophils Absolute: 0 10*3/uL (ref 0.0–0.1)
EOS%: 0.7 % (ref 0.0–7.0)
Eosinophils Absolute: 0.1 10*3/uL (ref 0.0–0.5)
HCT: 36.1 % — ABNORMAL LOW (ref 38.4–49.9)
HGB: 12.2 g/dL — ABNORMAL LOW (ref 13.0–17.1)
Immature Retic Fract: 11.1 % — ABNORMAL HIGH (ref 3.00–10.60)
LYMPH%: 20.9 % (ref 14.0–49.0)
MCH: 32.4 pg (ref 27.2–33.4)
MCHC: 33.8 g/dL (ref 32.0–36.0)
MCV: 95.8 fL (ref 79.3–98.0)
MONO#: 0.9 10*3/uL (ref 0.1–0.9)
MONO%: 12.6 % (ref 0.0–14.0)
NEUT%: 65.7 % (ref 39.0–75.0)
NEUTROS ABS: 4.6 10*3/uL (ref 1.5–6.5)
PLATELETS: 118 10*3/uL — AB (ref 140–400)
RBC: 3.77 10*6/uL — AB (ref 4.20–5.82)
RDW: 12.5 % (ref 11.0–14.6)
Retic %: 1.97 % — ABNORMAL HIGH (ref 0.80–1.80)
Retic Ct Abs: 74.27 10*3/uL (ref 34.80–93.90)
WBC: 7 10*3/uL (ref 4.0–10.3)
lymph#: 1.5 10*3/uL (ref 0.9–3.3)

## 2016-02-06 MED ORDER — ROMIPLOSTIM 250 MCG ~~LOC~~ SOLR
50.0000 ug | SUBCUTANEOUS | Status: DC
Start: 2016-02-06 — End: 2016-02-06
  Administered 2016-02-06: 50 ug via SUBCUTANEOUS
  Filled 2016-02-06: qty 0.1

## 2016-02-06 NOTE — Telephone Encounter (Signed)
Per Dr. Alen Blew, called pt to inquire about who he previously used for physical therapy.  Want to set up another PT assessment.  LVM with pt asking for call back with PT information.

## 2016-02-06 NOTE — Patient Instructions (Signed)
Romiplostim injection What is this medicine? ROMIPLOSTIM ([place] mi PLOE stim) helps your body make more platelets. This medicine is used to treat low platelets caused by chronic idiopathic thrombocytopenic purpura (ITP). This medicine may be used for other purposes; ask your health care provider or pharmacist if you have questions. What should I tell my health care provider before I take this medicine? They need to know if you have any of these conditions: -cancer or myelodysplastic syndrome -low blood counts, like low white cell, platelet, or red cell counts -take medicines that treat or prevent blood clots -an unusual or allergic reaction to romiplostim, mannitol, other medicines, foods, dyes, or preservatives -pregnant or trying to get pregnant -breast-feeding How should I use this medicine? This medicine is for injection under the skin. It is given by a health care professional in a hospital or clinic setting. A special MedGuide will be given to you before your injection. Read this information carefully each time. Talk to your pediatrician regarding the use of this medicine in children. Special care may be needed. Overdosage: If you think you have taken too much of this medicine contact a poison control center or emergency room at once. NOTE: This medicine is only for you. Do not share this medicine with others. What if I miss a dose? It is important not to miss your dose. Call your doctor or health care professional if you are unable to keep an appointment. What may interact with this medicine? Interactions are not expected. This list may not describe all possible interactions. Give your health care provider a list of all the medicines, herbs, non-prescription drugs, or dietary supplements you use. Also tell them if you smoke, drink alcohol, or use illegal drugs. Some items may interact with your medicine. What should I watch for while using this medicine? Your condition will be monitored  carefully while you are receiving this medicine. Visit your prescriber or health care professional for regular checks on your progress and for the needed blood tests. It is important to keep all appointments. What side effects may I notice from receiving this medicine? Side effects that you should report to your doctor or health care professional as soon as possible: -allergic reactions like skin rash, itching or hives, swelling of the face, lips, or tongue -shortness of breath, chest pain, swelling in a leg -unusual bleeding or bruising Side effects that usually do not require medical attention (report to your doctor or health care professional if they continue or are bothersome): -dizziness -headache -muscle aches -pain in arms and legs -stomach pain -trouble sleeping This list may not describe all possible side effects. Call your doctor for medical advice about side effects. You may report side effects to FDA at 1-800-FDA-1088. Where should I keep my medicine? This drug is given in a hospital or clinic and will not be stored at home. NOTE: This sheet is a summary. It may not cover all possible information. If you have questions about this medicine, talk to your doctor, pharmacist, or health care provider.    2016, Elsevier/Gold Standard. (2007-10-12 15:13:04) Cyanocobalamin, Vitamin B12 injection What is this medicine? CYANOCOBALAMIN (sye an oh koe BAL a min) is a man made form of vitamin B12. Vitamin B12 is used in the growth of healthy blood cells, nerve cells, and proteins in the body. It also helps with the metabolism of fats and carbohydrates. This medicine is used to treat people who can not absorb vitamin B12. This medicine may be used for other   purposes; ask your health care provider or pharmacist if you have questions. What should I tell my health care provider before I take this medicine? They need to know if you have any of these conditions: -kidney disease -Leber's  disease -megaloblastic anemia -an unusual or allergic reaction to cyanocobalamin, cobalt, other medicines, foods, dyes, or preservatives -pregnant or trying to get pregnant -breast-feeding How should I use this medicine? This medicine is injected into a muscle or deeply under the skin. It is usually given by a health care professional in a clinic or doctor's office. However, your doctor may teach you how to inject yourself. Follow all instructions. Talk to your pediatrician regarding the use of this medicine in children. Special care may be needed. Overdosage: If you think you have taken too much of this medicine contact a poison control center or emergency room at once. NOTE: This medicine is only for you. Do not share this medicine with others. What if I miss a dose? If you are given your dose at a clinic or doctor's office, call to reschedule your appointment. If you give your own injections and you miss a dose, take it as soon as you can. If it is almost time for your next dose, take only that dose. Do not take double or extra doses. What may interact with this medicine? -colchicine -heavy alcohol intake This list may not describe all possible interactions. Give your health care provider a list of all the medicines, herbs, non-prescription drugs, or dietary supplements you use. Also tell them if you smoke, drink alcohol, or use illegal drugs. Some items may interact with your medicine. What should I watch for while using this medicine? Visit your doctor or health care professional regularly. You may need blood work done while you are taking this medicine. You may need to follow a special diet. Talk to your doctor. Limit your alcohol intake and avoid smoking to get the best benefit. What side effects may I notice from receiving this medicine? Side effects that you should report to your doctor or health care professional as soon as possible: -allergic reactions like skin rash, itching or  hives, swelling of the face, lips, or tongue -blue tint to skin -chest tightness, pain -difficulty breathing, wheezing -dizziness -red, swollen painful area on the leg Side effects that usually do not require medical attention (report to your doctor or health care professional if they continue or are bothersome): -diarrhea -headache This list may not describe all possible side effects. Call your doctor for medical advice about side effects. You may report side effects to FDA at 1-800-FDA-1088. Where should I keep my medicine? Keep out of the reach of children. Store at room temperature between 15 and 30 degrees C (59 and 85 degrees F). Protect from light. Throw away any unused medicine after the expiration date. NOTE: This sheet is a summary. It may not cover all possible information. If you have questions about this medicine, talk to your doctor, pharmacist, or health care provider.    2016, Elsevier/Gold Standard. (2007-05-25 22:10:20)  

## 2016-02-12 ENCOUNTER — Ambulatory Visit (HOSPITAL_BASED_OUTPATIENT_CLINIC_OR_DEPARTMENT_OTHER): Payer: Medicare Other

## 2016-02-12 DIAGNOSIS — Z95828 Presence of other vascular implants and grafts: Secondary | ICD-10-CM

## 2016-02-12 DIAGNOSIS — D693 Immune thrombocytopenic purpura: Secondary | ICD-10-CM | POA: Diagnosis present

## 2016-02-12 MED ORDER — ROMIPLOSTIM 250 MCG ~~LOC~~ SOLR
50.0000 ug | SUBCUTANEOUS | Status: DC
  Administered 2016-02-12: 50 ug via SUBCUTANEOUS
  Filled 2016-02-12: qty 0.1

## 2016-02-12 NOTE — Patient Instructions (Signed)
Romiplostim injection What is this medicine? ROMIPLOSTIM ([place] mi PLOE stim) helps your body make more platelets. This medicine is used to treat low platelets caused by chronic idiopathic thrombocytopenic purpura (ITP). This medicine may be used for other purposes; ask your health care provider or pharmacist if you have questions. What should I tell my health care provider before I take this medicine? They need to know if you have any of these conditions: -cancer or myelodysplastic syndrome -low blood counts, like low white cell, platelet, or red cell counts -take medicines that treat or prevent blood clots -an unusual or allergic reaction to romiplostim, mannitol, other medicines, foods, dyes, or preservatives -pregnant or trying to get pregnant -breast-feeding How should I use this medicine? This medicine is for injection under the skin. It is given by a health care professional in a hospital or clinic setting. A special MedGuide will be given to you before your injection. Read this information carefully each time. Talk to your pediatrician regarding the use of this medicine in children. Special care may be needed. Overdosage: If you think you have taken too much of this medicine contact a poison control center or emergency room at once. NOTE: This medicine is only for you. Do not share this medicine with others. What if I miss a dose? It is important not to miss your dose. Call your doctor or health care professional if you are unable to keep an appointment. What may interact with this medicine? Interactions are not expected. This list may not describe all possible interactions. Give your health care provider a list of all the medicines, herbs, non-prescription drugs, or dietary supplements you use. Also tell them if you smoke, drink alcohol, or use illegal drugs. Some items may interact with your medicine. What should I watch for while using this medicine? Your condition will be monitored  carefully while you are receiving this medicine. Visit your prescriber or health care professional for regular checks on your progress and for the needed blood tests. It is important to keep all appointments. What side effects may I notice from receiving this medicine? Side effects that you should report to your doctor or health care professional as soon as possible: -allergic reactions like skin rash, itching or hives, swelling of the face, lips, or tongue -shortness of breath, chest pain, swelling in a leg -unusual bleeding or bruising Side effects that usually do not require medical attention (report to your doctor or health care professional if they continue or are bothersome): -dizziness -headache -muscle aches -pain in arms and legs -stomach pain -trouble sleeping This list may not describe all possible side effects. Call your doctor for medical advice about side effects. You may report side effects to FDA at 1-800-FDA-1088. Where should I keep my medicine? This drug is given in a hospital or clinic and will not be stored at home. NOTE: This sheet is a summary. It may not cover all possible information. If you have questions about this medicine, talk to your doctor, pharmacist, or health care provider.    2016, Elsevier/Gold Standard. (2007-10-12 15:13:04) Cyanocobalamin, Vitamin B12 injection What is this medicine? CYANOCOBALAMIN (sye an oh koe BAL a min) is a man made form of vitamin B12. Vitamin B12 is used in the growth of healthy blood cells, nerve cells, and proteins in the body. It also helps with the metabolism of fats and carbohydrates. This medicine is used to treat people who can not absorb vitamin B12. This medicine may be used for other   purposes; ask your health care provider or pharmacist if you have questions. What should I tell my health care provider before I take this medicine? They need to know if you have any of these conditions: -kidney disease -Leber's  disease -megaloblastic anemia -an unusual or allergic reaction to cyanocobalamin, cobalt, other medicines, foods, dyes, or preservatives -pregnant or trying to get pregnant -breast-feeding How should I use this medicine? This medicine is injected into a muscle or deeply under the skin. It is usually given by a health care professional in a clinic or doctor's office. However, your doctor may teach you how to inject yourself. Follow all instructions. Talk to your pediatrician regarding the use of this medicine in children. Special care may be needed. Overdosage: If you think you have taken too much of this medicine contact a poison control center or emergency room at once. NOTE: This medicine is only for you. Do not share this medicine with others. What if I miss a dose? If you are given your dose at a clinic or doctor's office, call to reschedule your appointment. If you give your own injections and you miss a dose, take it as soon as you can. If it is almost time for your next dose, take only that dose. Do not take double or extra doses. What may interact with this medicine? -colchicine -heavy alcohol intake This list may not describe all possible interactions. Give your health care provider a list of all the medicines, herbs, non-prescription drugs, or dietary supplements you use. Also tell them if you smoke, drink alcohol, or use illegal drugs. Some items may interact with your medicine. What should I watch for while using this medicine? Visit your doctor or health care professional regularly. You may need blood work done while you are taking this medicine. You may need to follow a special diet. Talk to your doctor. Limit your alcohol intake and avoid smoking to get the best benefit. What side effects may I notice from receiving this medicine? Side effects that you should report to your doctor or health care professional as soon as possible: -allergic reactions like skin rash, itching or  hives, swelling of the face, lips, or tongue -blue tint to skin -chest tightness, pain -difficulty breathing, wheezing -dizziness -red, swollen painful area on the leg Side effects that usually do not require medical attention (report to your doctor or health care professional if they continue or are bothersome): -diarrhea -headache This list may not describe all possible side effects. Call your doctor for medical advice about side effects. You may report side effects to FDA at 1-800-FDA-1088. Where should I keep my medicine? Keep out of the reach of children. Store at room temperature between 15 and 30 degrees C (59 and 85 degrees F). Protect from light. Throw away any unused medicine after the expiration date. NOTE: This sheet is a summary. It may not cover all possible information. If you have questions about this medicine, talk to your doctor, pharmacist, or health care provider.    2016, Elsevier/Gold Standard. (2007-05-25 22:10:20)  

## 2016-02-13 ENCOUNTER — Ambulatory Visit: Payer: Medicare Other

## 2016-02-13 ENCOUNTER — Other Ambulatory Visit: Payer: Medicare Other

## 2016-02-13 ENCOUNTER — Ambulatory Visit: Payer: Medicare Other | Admitting: Hematology

## 2016-02-20 ENCOUNTER — Other Ambulatory Visit (HOSPITAL_BASED_OUTPATIENT_CLINIC_OR_DEPARTMENT_OTHER): Payer: Medicare Other

## 2016-02-20 ENCOUNTER — Ambulatory Visit (HOSPITAL_BASED_OUTPATIENT_CLINIC_OR_DEPARTMENT_OTHER): Payer: Medicare Other

## 2016-02-20 VITALS — BP 167/48 | HR 58 | Temp 97.8°F | Resp 20

## 2016-02-20 DIAGNOSIS — Z95828 Presence of other vascular implants and grafts: Secondary | ICD-10-CM

## 2016-02-20 DIAGNOSIS — D693 Immune thrombocytopenic purpura: Secondary | ICD-10-CM | POA: Diagnosis present

## 2016-02-20 LAB — CBC & DIFF AND RETIC
BASO%: 0.2 % (ref 0.0–2.0)
BASOS ABS: 0 10*3/uL (ref 0.0–0.1)
EOS%: 1.5 % (ref 0.0–7.0)
Eosinophils Absolute: 0.1 10*3/uL (ref 0.0–0.5)
HEMATOCRIT: 36.4 % — AB (ref 38.4–49.9)
HEMOGLOBIN: 12 g/dL — AB (ref 13.0–17.1)
Immature Retic Fract: 11.2 % — ABNORMAL HIGH (ref 3.00–10.60)
LYMPH#: 1.4 10*3/uL (ref 0.9–3.3)
LYMPH%: 26.3 % (ref 14.0–49.0)
MCH: 31.9 pg (ref 27.2–33.4)
MCHC: 33 g/dL (ref 32.0–36.0)
MCV: 96.8 fL (ref 79.3–98.0)
MONO#: 0.7 10*3/uL (ref 0.1–0.9)
MONO%: 12.8 % (ref 0.0–14.0)
NEUT#: 3.2 10*3/uL (ref 1.5–6.5)
NEUT%: 59.2 % (ref 39.0–75.0)
PLATELETS: 170 10*3/uL (ref 140–400)
RBC: 3.76 10*6/uL — ABNORMAL LOW (ref 4.20–5.82)
RDW: 13 % (ref 11.0–14.6)
RETIC %: 1.94 % — AB (ref 0.80–1.80)
RETIC CT ABS: 72.94 10*3/uL (ref 34.80–93.90)
WBC: 5.4 10*3/uL (ref 4.0–10.3)
nRBC: 0 % (ref 0–0)

## 2016-02-20 MED ORDER — ROMIPLOSTIM 250 MCG ~~LOC~~ SOLR
50.0000 ug | SUBCUTANEOUS | Status: DC
  Administered 2016-02-20: 50 ug via SUBCUTANEOUS
  Filled 2016-02-20: qty 0.1

## 2016-02-20 NOTE — Patient Instructions (Signed)
Romiplostim injection What is this medicine? ROMIPLOSTIM (roe mi PLOE stim) helps your body make more platelets. This medicine is used to treat low platelets caused by chronic idiopathic thrombocytopenic purpura (ITP). COMMON BRAND NAME(S): Nplate What should I tell my health care provider before I take this medicine? They need to know if you have any of these conditions: -cancer or myelodysplastic syndrome -low blood counts, like low white cell, platelet, or red cell counts -take medicines that treat or prevent blood clots -an unusual or allergic reaction to romiplostim, mannitol, other medicines, foods, dyes, or preservatives -pregnant or trying to get pregnant -breast-feeding How should I use this medicine? This medicine is for injection under the skin. It is given by a health care professional in a hospital or clinic setting. A special MedGuide will be given to you before your injection. Read this information carefully each time. Talk to your pediatrician regarding the use of this medicine in children. Special care may be needed. What if I miss a dose? It is important not to miss your dose. Call your doctor or health care professional if you are unable to keep an appointment. What may interact with this medicine? Interactions are not expected. What should I watch for while using this medicine? Your condition will be monitored carefully while you are receiving this medicine. Visit your prescriber or health care professional for regular checks on your progress and for the needed blood tests. It is important to keep all appointments. What side effects may I notice from receiving this medicine? Side effects that you should report to your doctor or health care professional as soon as possible: -allergic reactions like skin rash, itching or hives, swelling of the face, lips, or tongue -shortness of breath, chest pain, swelling in a leg -unusual bleeding or bruising Side effects that usually  do not require medical attention (report to your doctor or health care professional if they continue or are bothersome): -dizziness -headache -muscle aches -pain in arms and legs -stomach pain -trouble sleeping Where should I keep my medicine? This drug is given in a hospital or clinic and will not be stored at home.  2017 Elsevier/Gold Standard (2007-10-12 15:13:04)  

## 2016-02-25 ENCOUNTER — Emergency Department (HOSPITAL_COMMUNITY): Payer: Medicare Other

## 2016-02-25 ENCOUNTER — Emergency Department (HOSPITAL_COMMUNITY)
Admission: EM | Admit: 2016-02-25 | Discharge: 2016-02-25 | Disposition: A | Discharge status: Home / Self Care | Payer: Medicare Other | Source: Home / Self Care | Attending: Emergency Medicine | Admitting: Emergency Medicine

## 2016-02-25 ENCOUNTER — Other Ambulatory Visit: Payer: Self-pay

## 2016-02-25 ENCOUNTER — Encounter (HOSPITAL_COMMUNITY): Payer: Self-pay | Admitting: Emergency Medicine

## 2016-02-25 DIAGNOSIS — Z87891 Personal history of nicotine dependence: Secondary | ICD-10-CM

## 2016-02-25 DIAGNOSIS — J9601 Acute respiratory failure with hypoxia: Secondary | ICD-10-CM | POA: Diagnosis not present

## 2016-02-25 DIAGNOSIS — I10 Essential (primary) hypertension: Secondary | ICD-10-CM

## 2016-02-25 DIAGNOSIS — Z66 Do not resuscitate: Secondary | ICD-10-CM | POA: Diagnosis not present

## 2016-02-25 DIAGNOSIS — J1100 Influenza due to unidentified influenza virus with unspecified type of pneumonia: Secondary | ICD-10-CM | POA: Diagnosis not present

## 2016-02-25 DIAGNOSIS — E1140 Type 2 diabetes mellitus with diabetic neuropathy, unspecified: Secondary | ICD-10-CM

## 2016-02-25 DIAGNOSIS — R0602 Shortness of breath: Secondary | ICD-10-CM | POA: Diagnosis not present

## 2016-02-25 DIAGNOSIS — I2510 Atherosclerotic heart disease of native coronary artery without angina pectoris: Secondary | ICD-10-CM | POA: Insufficient documentation

## 2016-02-25 DIAGNOSIS — N179 Acute kidney failure, unspecified: Secondary | ICD-10-CM | POA: Diagnosis not present

## 2016-02-25 DIAGNOSIS — A419 Sepsis, unspecified organism: Secondary | ICD-10-CM | POA: Diagnosis not present

## 2016-02-25 DIAGNOSIS — Z96641 Presence of right artificial hip joint: Secondary | ICD-10-CM

## 2016-02-25 DIAGNOSIS — R6889 Other general symptoms and signs: Secondary | ICD-10-CM

## 2016-02-25 DIAGNOSIS — D693 Immune thrombocytopenic purpura: Secondary | ICD-10-CM | POA: Diagnosis not present

## 2016-02-25 DIAGNOSIS — J111 Influenza due to unidentified influenza virus with other respiratory manifestations: Secondary | ICD-10-CM | POA: Diagnosis not present

## 2016-02-25 DIAGNOSIS — Z951 Presence of aortocoronary bypass graft: Secondary | ICD-10-CM | POA: Insufficient documentation

## 2016-02-25 DIAGNOSIS — R05 Cough: Secondary | ICD-10-CM

## 2016-02-25 MED ORDER — AZITHROMYCIN 250 MG PO TABS: 250.0000 mg | ORAL_TABLET | Freq: Every day | ORAL | 0 refills | Status: DC

## 2016-02-25 MED ORDER — OSELTAMIVIR PHOSPHATE 75 MG PO CAPS: 75.0000 mg | ORAL_CAPSULE | Freq: Two times a day (BID) | ORAL | 0 refills | Status: DC

## 2016-02-25 NOTE — ED Triage Notes (Signed)
Pt c/o cough with yellow mucus, chills, sore throat, worsening of chronic SOB onset last night. Self-treated with cough drops with some relief. No CP.

## 2016-02-25 NOTE — ED Provider Notes (Signed)
[facility] DEPT Provider Note   CSN: [id number] Arrival date & time: 02/25/16  1144     History   Chief Complaint Chief Complaint  Patient presents with  . Cough    HPI Lawrence Warner is a 82 y.o. male.  HPI   82 year old male presenting for evaluation of cough and shortness of breath. He has chronic dyspnea but states it's been worse since last night. Cough is productive for yellowish sputum. Complains subjective fever as well as chills. Sore throat. Headaches. Muscle aches. No rash. No urinary complaints. No vomiting or diarrhea.  Past Medical History:  Diagnosis Date  . BPH (benign prostatic hypertrophy)   . Chronic renal insufficiency   . Coronary artery disease    CABG in 2000  . Diabetes type 2, controlled ([place])    Diet-controlled  . Diabetic neuropathy ([place])   . Dyslipidemia   . Hypertension   . ITP (idiopathic thrombocytopenic purpura)   . Osteoarthritis     Patient Active Problem List   Diagnosis Date Noted  . Constipation 12/07/2015  . Bradycardia 11/28/2015  . Port catheter in place 10/24/2015  . GERD (gastroesophageal reflux disease) 10/09/2015  . Gait instability 10/09/2015  . Chest pain 10/05/2015  . Diet-controlled diabetes mellitus ([place]) 05/07/2015  . CAD (coronary artery disease) 05/07/2015  . Essential hypertension 05/07/2015  . Muscle cramps 04/25/2015  . Shortness of breath 04/11/2015  . Idiopathic thrombocytopenic purpura ([place]) 09/27/2014    Past Surgical History:  Procedure Laterality Date  . CHOLECYSTECTOMY    . CORONARY ARTERY BYPASS GRAFT  2000  . Right hip replacement  2014  . TONSILLECTOMY         Home Medications    Prior to Admission medications   Medication Sig Start Date End Date Taking? Authorizing Provider  acetaminophen (TYLENOL) 500 MG tablet Take 250-1,000 mg by mouth every 6 (six) hours as needed for mild pain, moderate pain or headache.     Historical Provider, MD  Ascorbic Acid (VITAMIN C) 100 MG tablet  Take 100 mg by mouth daily.     Historical Provider, MD  cholecalciferol (VITAMIN D) 1000 units tablet Take 1,000 Units by mouth daily.    Historical Provider, MD  losartan (COZAAR) 100 MG tablet Take 100 mg by mouth daily.     Historical Provider, MD  magnesium chloride (SLOW-MAG) 64 MG TBEC SR tablet Take 1 tablet (64 mg total) by mouth daily. 04/25/15   [name], MD  metoprolol tartrate (LOPRESSOR) 25 MG tablet Take 0.5 tablets (12.5 mg total) by mouth 2 (two) times daily. 10/09/15   Hoyt Koch, MD  nitroGLYCERIN (NITROSTAT) 0.4 MG SL tablet Place 1 tablet (0.4 mg total) under the tongue every 5 (five) minutes as needed for chest pain. 12/13/15 03/12/16  Minus Breeding, MD  omeprazole (PRILOSEC) 20 MG capsule Take 20 mg by mouth daily.    Historical Provider, MD  polyethylene glycol powder (GLYCOLAX/MIRALAX) powder Take 34 g by mouth daily. 2 capfuls daily 12/07/15   Laban Emperor Zehr, PA-C  simvastatin (ZOCOR) 20 MG tablet Take 20 mg by mouth at bedtime.     Historical Provider, MD    Family History Family History  Problem Relation Age of Onset  . Lung disease Father   . Cancer Brother     Social History Social History  Substance Use Topics  . Smoking status: Former Smoker    Packs/day: 2.00    Years: 25.00  . Smokeless tobacco: Never Used  . Alcohol  use No     Allergies   Alfuzosin hcl er and Penicillins   Review of Systems Review of Systems  All systems reviewed and negative, other than as noted in HPI.   Physical Exam Updated Vital Signs BP 168/61 (BP Location: Right Arm)   Pulse 94   Temp 99.4 F (37.4 C) (Oral)   Resp 18   SpO2 94%   Physical Exam  Constitutional: He appears well-developed and well-nourished. No distress.  Laying in bed. Appears tired, but not toxic. Answers questions appropriately and follows commands.  HENT:  Head: Normocephalic and atraumatic.  Eyes: Conjunctivae are normal. Right eye exhibits no discharge. Left eye  exhibits no discharge.  Neck: Neck supple.  Cardiovascular: Normal rate, regular rhythm and normal heart sounds.  Exam reveals no gallop and no friction rub.   No murmur heard. Pulmonary/Chest: Effort normal and breath sounds normal. No respiratory distress.  Abdominal: Soft. He exhibits no distension. There is no tenderness.  Musculoskeletal: He exhibits no edema or tenderness.  Neurological: He is alert.  Skin: Skin is warm and dry.  Psychiatric: He has a normal mood and affect. His behavior is normal. Thought content normal.  Nursing note and vitals reviewed.    ED Treatments / Results  Labs (all labs ordered are listed, but only abnormal results are displayed) Labs Reviewed - No data to display  EKG  EKG Interpretation  Date/Time:  Sunday February 25 2016 12:04:24 EST Ventricular Rate:  91 PR Interval:    QRS Duration: 159 QT Interval:  400 QTC Calculation: 493 R Axis:   -43 Text Interpretation:  Sinus rhythm Prolonged PR interval Left bundle branch block Baseline wander in lead(s) V1 SINCE LAST TRACING HEART RATE HAS INCREASED Confirmed by JACUBOWITZ  MD, SAM ([id number]) on 02/28/2016 10:10:30 AM       Radiology Dg Chest 2 View  Result Date: 02/25/2016 CLINICAL DATA:  Shortness of breath, productive cough. EXAM: CHEST  2 VIEW COMPARISON:  10/05/2015 FINDINGS: Prior CABG. Minimal bibasilar atelectasis. Heart is normal size. No effusions or acute bony abnormality. IMPRESSION: Minimal bibasilar atelectasis.  No active disease. Electronically Signed   By: Kevin  Dover M.D.   On: 02/25/2016 12:22    Procedures Procedures (including critical care time)  Medications Ordered in ED Medications - No data to display   Initial Impression / Assessment and Plan / ED Course  I have reviewed the triage vital signs and the nursing notes.  Pertinent labs & imaging results that were available during my care of the patient were reviewed by me and considered in my medical decision making  (see chart for details).  Clinical Course     82  year old male with what I clinically suspect may be the flu. He has certainly is high risk if he does indeed have the flu, but at this time, I feel is appropriate for discharge. He is not hypotensive. He is drinking. Chest x-ray is without focal infiltrate but because of his advanced age will treat with azithromycin in addition to Tamiflu. Rest. Fluids. Strict return precautions discussed.  Final Clinical Impressions(s) / ED Diagnoses   Final diagnoses:  Flu-like symptoms    New Prescriptions New Prescriptions   No medications on file     Virgel Manifold, MD 03/04/16 1540

## 2016-02-27 ENCOUNTER — Ambulatory Visit: Payer: Medicare Other

## 2016-02-27 ENCOUNTER — Ambulatory Visit: Payer: Medicare Other | Admitting: Hematology

## 2016-02-27 ENCOUNTER — Other Ambulatory Visit: Payer: Medicare Other

## 2016-02-27 ENCOUNTER — Telehealth: Payer: Self-pay | Admitting: Internal Medicine

## 2016-02-27 NOTE — Telephone Encounter (Signed)
Needs skilled nursing eval orders.  States patient went to ED over the weekend and tested positive for flu.  States patient is in independent living and is very weak.

## 2016-02-27 NOTE — Telephone Encounter (Signed)
Is it ok to send order for skilled nursing eval

## 2016-02-28 ENCOUNTER — Inpatient Hospital Stay (HOSPITAL_COMMUNITY): Payer: Medicare Other

## 2016-02-28 ENCOUNTER — Encounter (HOSPITAL_COMMUNITY): Payer: Self-pay | Admitting: *Deleted

## 2016-02-28 ENCOUNTER — Inpatient Hospital Stay (HOSPITAL_COMMUNITY)
Admission: EM | Admit: 2016-02-28 | Discharge: 2016-03-06 | DRG: 871 | Disposition: A | Discharge status: Skilled Nursing Facility | Payer: Medicare Other | Attending: Internal Medicine | Admitting: Internal Medicine

## 2016-02-28 ENCOUNTER — Other Ambulatory Visit: Payer: Self-pay

## 2016-02-28 ENCOUNTER — Emergency Department (HOSPITAL_COMMUNITY): Payer: Medicare Other

## 2016-02-28 DIAGNOSIS — J1100 Influenza due to unidentified influenza virus with unspecified type of pneumonia: Secondary | ICD-10-CM | POA: Diagnosis present

## 2016-02-28 DIAGNOSIS — J9601 Acute respiratory failure with hypoxia: Secondary | ICD-10-CM | POA: Diagnosis present

## 2016-02-28 DIAGNOSIS — E119 Type 2 diabetes mellitus without complications: Secondary | ICD-10-CM

## 2016-02-28 DIAGNOSIS — K219 Gastro-esophageal reflux disease without esophagitis: Secondary | ICD-10-CM | POA: Diagnosis present

## 2016-02-28 DIAGNOSIS — I248 Other forms of acute ischemic heart disease: Secondary | ICD-10-CM | POA: Diagnosis present

## 2016-02-28 DIAGNOSIS — R079 Chest pain, unspecified: Secondary | ICD-10-CM | POA: Diagnosis present

## 2016-02-28 DIAGNOSIS — E872 Acidosis: Secondary | ICD-10-CM | POA: Diagnosis present

## 2016-02-28 DIAGNOSIS — M1990 Unspecified osteoarthritis, unspecified site: Secondary | ICD-10-CM | POA: Diagnosis present

## 2016-02-28 DIAGNOSIS — E1122 Type 2 diabetes mellitus with diabetic chronic kidney disease: Secondary | ICD-10-CM | POA: Diagnosis present

## 2016-02-28 DIAGNOSIS — E569 Vitamin deficiency, unspecified: Secondary | ICD-10-CM | POA: Diagnosis not present

## 2016-02-28 DIAGNOSIS — K5900 Constipation, unspecified: Secondary | ICD-10-CM | POA: Diagnosis not present

## 2016-02-28 DIAGNOSIS — E1140 Type 2 diabetes mellitus with diabetic neuropathy, unspecified: Secondary | ICD-10-CM | POA: Diagnosis present

## 2016-02-28 DIAGNOSIS — I2510 Atherosclerotic heart disease of native coronary artery without angina pectoris: Secondary | ICD-10-CM | POA: Diagnosis present

## 2016-02-28 DIAGNOSIS — I10 Essential (primary) hypertension: Secondary | ICD-10-CM | POA: Diagnosis present

## 2016-02-28 DIAGNOSIS — Z951 Presence of aortocoronary bypass graft: Secondary | ICD-10-CM

## 2016-02-28 DIAGNOSIS — M6281 Muscle weakness (generalized): Secondary | ICD-10-CM | POA: Diagnosis not present

## 2016-02-28 DIAGNOSIS — Z66 Do not resuscitate: Secondary | ICD-10-CM | POA: Diagnosis present

## 2016-02-28 DIAGNOSIS — R7989 Other specified abnormal findings of blood chemistry: Secondary | ICD-10-CM | POA: Diagnosis present

## 2016-02-28 DIAGNOSIS — R638 Other symptoms and signs concerning food and fluid intake: Secondary | ICD-10-CM | POA: Diagnosis present

## 2016-02-28 DIAGNOSIS — J111 Influenza due to unidentified influenza virus with other respiratory manifestations: Secondary | ICD-10-CM | POA: Diagnosis not present

## 2016-02-28 DIAGNOSIS — I129 Hypertensive chronic kidney disease with stage 1 through stage 4 chronic kidney disease, or unspecified chronic kidney disease: Secondary | ICD-10-CM | POA: Diagnosis present

## 2016-02-28 DIAGNOSIS — E785 Hyperlipidemia, unspecified: Secondary | ICD-10-CM | POA: Diagnosis present

## 2016-02-28 DIAGNOSIS — R000 Tachycardia, unspecified: Secondary | ICD-10-CM | POA: Diagnosis present

## 2016-02-28 DIAGNOSIS — Z888 Allergy status to other drugs, medicaments and biological substances status: Secondary | ICD-10-CM

## 2016-02-28 DIAGNOSIS — E876 Hypokalemia: Secondary | ICD-10-CM | POA: Diagnosis present

## 2016-02-28 DIAGNOSIS — Z87891 Personal history of nicotine dependence: Secondary | ICD-10-CM

## 2016-02-28 DIAGNOSIS — D693 Immune thrombocytopenic purpura: Secondary | ICD-10-CM | POA: Diagnosis present

## 2016-02-28 DIAGNOSIS — R2681 Unsteadiness on feet: Secondary | ICD-10-CM | POA: Diagnosis not present

## 2016-02-28 DIAGNOSIS — R269 Unspecified abnormalities of gait and mobility: Secondary | ICD-10-CM | POA: Diagnosis not present

## 2016-02-28 DIAGNOSIS — G8929 Other chronic pain: Secondary | ICD-10-CM | POA: Diagnosis not present

## 2016-02-28 DIAGNOSIS — R0602 Shortness of breath: Secondary | ICD-10-CM | POA: Diagnosis not present

## 2016-02-28 DIAGNOSIS — Z880 Allergy status to penicillin: Secondary | ICD-10-CM

## 2016-02-28 DIAGNOSIS — R2689 Other abnormalities of gait and mobility: Secondary | ICD-10-CM | POA: Diagnosis not present

## 2016-02-28 DIAGNOSIS — A419 Sepsis, unspecified organism: Secondary | ICD-10-CM | POA: Diagnosis not present

## 2016-02-28 DIAGNOSIS — R262 Difficulty in walking, not elsewhere classified: Secondary | ICD-10-CM | POA: Diagnosis not present

## 2016-02-28 DIAGNOSIS — N179 Acute kidney failure, unspecified: Secondary | ICD-10-CM | POA: Diagnosis present

## 2016-02-28 DIAGNOSIS — N189 Chronic kidney disease, unspecified: Secondary | ICD-10-CM | POA: Diagnosis present

## 2016-02-28 DIAGNOSIS — N400 Enlarged prostate without lower urinary tract symptoms: Secondary | ICD-10-CM | POA: Diagnosis present

## 2016-02-28 DIAGNOSIS — Z79899 Other long term (current) drug therapy: Secondary | ICD-10-CM

## 2016-02-28 DIAGNOSIS — Z96641 Presence of right artificial hip joint: Secondary | ICD-10-CM | POA: Diagnosis present

## 2016-02-28 DIAGNOSIS — R05 Cough: Secondary | ICD-10-CM | POA: Diagnosis not present

## 2016-02-28 DIAGNOSIS — J9611 Chronic respiratory failure with hypoxia: Secondary | ICD-10-CM | POA: Insufficient documentation

## 2016-02-28 DIAGNOSIS — R278 Other lack of coordination: Secondary | ICD-10-CM | POA: Diagnosis not present

## 2016-02-28 LAB — CBC WITH DIFFERENTIAL/PLATELET
BASOS ABS: 0 10*3/uL (ref 0.0–0.1)
Basophils Absolute: 0 10*3/uL (ref 0.0–0.1)
Basophils Relative: 0 %
Basophils Relative: 0 %
EOS PCT: 0 %
EOS PCT: 0 %
Eosinophils Absolute: 0 10*3/uL (ref 0.0–0.7)
Eosinophils Absolute: 0 10*3/uL (ref 0.0–0.7)
HEMATOCRIT: 37.4 % — AB (ref 39.0–52.0)
HEMATOCRIT: 33.8 % — AB (ref 39.0–52.0)
HEMOGLOBIN: 12.1 g/dL — AB (ref 13.0–17.0)
HEMOGLOBIN: 10.5 g/dL — AB (ref 13.0–17.0)
LYMPHS ABS: 2.4 10*3/uL (ref 0.7–4.0)
LYMPHS ABS: 1.3 10*3/uL (ref 0.7–4.0)
LYMPHS PCT: 20 %
LYMPHS PCT: 11 %
MCH: 31.9 pg (ref 26.0–34.0)
MCH: 31.5 pg (ref 26.0–34.0)
MCHC: 32.4 g/dL (ref 30.0–36.0)
MCHC: 31.1 g/dL (ref 30.0–36.0)
MCV: 98.7 fL (ref 78.0–100.0)
MCV: 101.5 fL — AB (ref 78.0–100.0)
MONOS PCT: 13 %
Monocytes Absolute: 1.6 10*3/uL — ABNORMAL HIGH (ref 0.1–1.0)
Monocytes Absolute: 1.2 10*3/uL — ABNORMAL HIGH (ref 0.1–1.0)
Monocytes Relative: 10 %
NEUTROS ABS: 8.1 10*3/uL — AB (ref 1.7–7.7)
NEUTROS PCT: 79 %
Neutro Abs: 9.2 10*3/uL — ABNORMAL HIGH (ref 1.7–7.7)
Neutrophils Relative %: 67 %
Platelets: 86 10*3/uL — ABNORMAL LOW (ref 150–400)
Platelets: 74 10*3/uL — ABNORMAL LOW (ref 150–400)
RBC: 3.79 MIL/uL — AB (ref 4.22–5.81)
RBC: 3.33 MIL/uL — AB (ref 4.22–5.81)
RDW: 13.3 % (ref 11.5–15.5)
RDW: 13.5 % (ref 11.5–15.5)
WBC: 12.1 10*3/uL — ABNORMAL HIGH (ref 4.0–10.5)
WBC: 11.6 10*3/uL — AB (ref 4.0–10.5)

## 2016-02-28 LAB — URINALYSIS, ROUTINE W REFLEX MICROSCOPIC
BILIRUBIN URINE: NEGATIVE
Glucose, UA: NEGATIVE mg/dL
Hgb urine dipstick: NEGATIVE
Ketones, ur: NEGATIVE mg/dL
Nitrite: NEGATIVE
PROTEIN: 100 mg/dL — AB
Specific Gravity, Urine: 1.013 (ref 1.005–1.030)
pH: 5 (ref 5.0–8.0)

## 2016-02-28 LAB — I-STAT ARTERIAL BLOOD GAS, ED
Acid-base deficit: 6 mmol/L — ABNORMAL HIGH (ref 0.0–2.0)
Bicarbonate: 21.3 mmol/L (ref 20.0–28.0)
O2 Saturation: 100 %
TCO2: 23 mmol/L (ref 0–100)
pCO2 arterial: 46 mmHg (ref 32.0–48.0)
pH, Arterial: 7.272 — ABNORMAL LOW (ref 7.350–7.450)
pO2, Arterial: 321 mmHg — ABNORMAL HIGH (ref 83.0–108.0)

## 2016-02-28 LAB — PROCALCITONIN: PROCALCITONIN: 0.22 ng/mL

## 2016-02-28 LAB — BASIC METABOLIC PANEL
ANION GAP: 14 (ref 5–15)
BUN: 32 mg/dL — AB (ref 6–20)
CHLORIDE: 104 mmol/L (ref 101–111)
CO2: 20 mmol/L — ABNORMAL LOW (ref 22–32)
Calcium: 9.1 mg/dL (ref 8.9–10.3)
Creatinine, Ser: 2.05 mg/dL — ABNORMAL HIGH (ref 0.61–1.24)
GFR calc Af Amer: 30 mL/min — ABNORMAL LOW (ref >60)
GFR calc non Af Amer: 26 mL/min — ABNORMAL LOW (ref >60)
GLUCOSE: 214 mg/dL — AB (ref 65–99)
POTASSIUM: 3.4 mmol/L — AB (ref 3.5–5.1)
Sodium: 138 mmol/L (ref 135–145)

## 2016-02-28 LAB — CBC
HCT: 33.6 % — ABNORMAL LOW (ref 39.0–52.0)
Hemoglobin: 10.7 g/dL — ABNORMAL LOW (ref 13.0–17.0)
MCH: 31.7 pg (ref 26.0–34.0)
MCHC: 31.8 g/dL (ref 30.0–36.0)
MCV: 99.4 fL (ref 78.0–100.0)
Platelets: 78 10*3/uL — ABNORMAL LOW (ref 150–400)
RBC: 3.38 MIL/uL — AB (ref 4.22–5.81)
RDW: 13.3 % (ref 11.5–15.5)
WBC: 12.9 10*3/uL — ABNORMAL HIGH (ref 4.0–10.5)

## 2016-02-28 LAB — EXPECTORATED SPUTUM ASSESSMENT W REFEX TO RESP CULTURE

## 2016-02-28 LAB — CREATININE, SERUM
CREATININE: 2.16 mg/dL — AB (ref 0.61–1.24)
GFR calc Af Amer: 28 mL/min — ABNORMAL LOW (ref >60)
GFR calc non Af Amer: 24 mL/min — ABNORMAL LOW (ref >60)

## 2016-02-28 LAB — CBG MONITORING, ED
Glucose-Capillary: 167 mg/dL — ABNORMAL HIGH (ref 65–99)
Glucose-Capillary: 176 mg/dL — ABNORMAL HIGH (ref 65–99)

## 2016-02-28 LAB — LACTIC ACID, PLASMA
LACTIC ACID, VENOUS: 3.7 mmol/L — AB (ref 0.5–1.9)
LACTIC ACID, VENOUS: 2.2 mmol/L — AB (ref 0.5–1.9)

## 2016-02-28 LAB — TROPONIN I
TROPONIN I: 0.11 ng/mL — AB (ref <0.03)
TROPONIN I: 0.4 ng/mL — AB (ref <0.03)
TROPONIN I: 0.68 ng/mL — AB (ref <0.03)
Troponin I: 0.14 ng/mL (ref <0.03)

## 2016-02-28 LAB — EXPECTORATED SPUTUM ASSESSMENT W GRAM STAIN, RFLX TO RESP C

## 2016-02-28 LAB — MAGNESIUM: MAGNESIUM: 1.9 mg/dL (ref 1.7–2.4)

## 2016-02-28 LAB — PHOSPHORUS: Phosphorus: 3.5 mg/dL (ref 2.5–4.6)

## 2016-02-28 LAB — BRAIN NATRIURETIC PEPTIDE
B NATRIURETIC PEPTIDE 5: 248.7 pg/mL — AB (ref 0.0–100.0)
B Natriuretic Peptide: 259 pg/mL — ABNORMAL HIGH (ref 0.0–100.0)

## 2016-02-28 LAB — PROTIME-INR
INR: 1.02
Prothrombin Time: 13.4 seconds (ref 11.4–15.2)

## 2016-02-28 LAB — APTT: aPTT: 37 seconds — ABNORMAL HIGH (ref 24–36)

## 2016-02-28 LAB — INFLUENZA PANEL BY PCR (TYPE A & B)
Influenza A By PCR: POSITIVE — AB
Influenza B By PCR: NEGATIVE

## 2016-02-28 MED ORDER — SODIUM CHLORIDE 0.9 % IV BOLUS (SEPSIS)
500.0000 mL | Freq: Once | INTRAVENOUS | Status: AC
  Administered 2016-02-28: 500 mL via INTRAVENOUS

## 2016-02-28 MED ORDER — OSELTAMIVIR PHOSPHATE 75 MG PO CAPS: 75.0000 mg | ORAL_CAPSULE | Freq: Two times a day (BID) | ORAL | Status: DC

## 2016-02-28 MED ORDER — SODIUM CHLORIDE 0.9% FLUSH
3.0000 mL | Freq: Two times a day (BID) | INTRAVENOUS | Status: DC
  Administered 2016-02-28 – 2016-03-06 (×13): 3 mL via INTRAVENOUS

## 2016-02-28 MED ORDER — ASPIRIN 81 MG PO CHEW
324.0000 mg | CHEWABLE_TABLET | Freq: Once | ORAL | Status: AC
  Administered 2016-02-28: 324 mg via ORAL
  Filled 2016-02-28: qty 4

## 2016-02-28 MED ORDER — VANCOMYCIN HCL IN DEXTROSE 1-5 GM/200ML-% IV SOLN
1000.0000 mg | INTRAVENOUS | Status: DC
  Administered 2016-02-29 – 2016-03-04 (×5): 1000 mg via INTRAVENOUS
  Filled 2016-02-28 (×5): qty 200

## 2016-02-28 MED ORDER — NITROGLYCERIN 0.4 MG SL SUBL: 0.4000 mg | SUBLINGUAL_TABLET | SUBLINGUAL | Status: DC | PRN

## 2016-02-28 MED ORDER — IPRATROPIUM BROMIDE 0.02 % IN SOLN
0.5000 mg | Freq: Once | RESPIRATORY_TRACT | Status: AC
  Administered 2016-02-28: 0.5 mg via RESPIRATORY_TRACT
  Filled 2016-02-28: qty 2.5

## 2016-02-28 MED ORDER — ASPIRIN EC 81 MG PO TBEC
81.0000 mg | DELAYED_RELEASE_TABLET | Freq: Every day | ORAL | Status: DC
  Administered 2016-02-29 – 2016-03-06 (×7): 81 mg via ORAL
  Filled 2016-02-28 (×7): qty 1

## 2016-02-28 MED ORDER — PANTOPRAZOLE SODIUM 40 MG PO TBEC
40.0000 mg | DELAYED_RELEASE_TABLET | Freq: Every day | ORAL | Status: DC
  Administered 2016-02-28 – 2016-03-06 (×8): 40 mg via ORAL
  Filled 2016-02-28 (×8): qty 1

## 2016-02-28 MED ORDER — METOPROLOL TARTRATE 12.5 MG HALF TABLET
12.5000 mg | ORAL_TABLET | Freq: Two times a day (BID) | ORAL | Status: DC
  Administered 2016-02-28 – 2016-03-06 (×14): 12.5 mg via ORAL
  Filled 2016-02-28 (×15): qty 1

## 2016-02-28 MED ORDER — POLYETHYLENE GLYCOL 3350 17 G PO PACK
34.0000 g | PACK | Freq: Every day | ORAL | Status: DC
  Administered 2016-02-28 – 2016-03-06 (×8): 34 g via ORAL
  Filled 2016-02-28 (×8): qty 2

## 2016-02-28 MED ORDER — IPRATROPIUM-ALBUTEROL 0.5-2.5 (3) MG/3ML IN SOLN
3.0000 mL | RESPIRATORY_TRACT | Status: DC
  Administered 2016-02-28 – 2016-03-04 (×33): 3 mL via RESPIRATORY_TRACT
  Filled 2016-02-28 (×34): qty 3

## 2016-02-28 MED ORDER — SODIUM CHLORIDE 0.9 % IV BOLUS (SEPSIS)
1000.0000 mL | Freq: Once | INTRAVENOUS | Status: AC
  Administered 2016-02-28: 1000 mL via INTRAVENOUS

## 2016-02-28 MED ORDER — LEVOFLOXACIN IN D5W 500 MG/100ML IV SOLN
500.0000 mg | Freq: Once | INTRAVENOUS | Status: DC
  Filled 2016-02-28: qty 100

## 2016-02-28 MED ORDER — ACETYLCYSTEINE 20 % IN SOLN
4.0000 mL | Freq: Once | RESPIRATORY_TRACT | Status: AC
  Administered 2016-02-28: 4 mL via RESPIRATORY_TRACT
  Filled 2016-02-28: qty 4

## 2016-02-28 MED ORDER — ACETAMINOPHEN 500 MG PO TABS
250.0000 mg | ORAL_TABLET | Freq: Four times a day (QID) | ORAL | Status: DC | PRN
  Administered 2016-02-28: 500 mg via ORAL
  Filled 2016-02-28: qty 1

## 2016-02-28 MED ORDER — ROMIPLOSTIM 250 MCG ~~LOC~~ SOLR
50.0000 ug | SUBCUTANEOUS | Status: DC
  Administered 2016-02-28 – 2016-03-06 (×2): 50 ug via SUBCUTANEOUS
  Filled 2016-02-28 (×2): qty 0.1

## 2016-02-28 MED ORDER — ONDANSETRON HCL 4 MG/2ML IJ SOLN: 4.0000 mg | Freq: Four times a day (QID) | INTRAMUSCULAR | Status: DC | PRN

## 2016-02-28 MED ORDER — LORAZEPAM 2 MG/ML IJ SOLN
0.5000 mg | Freq: Once | INTRAMUSCULAR | Status: DC
  Filled 2016-02-28: qty 1

## 2016-02-28 MED ORDER — ONDANSETRON HCL 4 MG PO TABS: 4.0000 mg | ORAL_TABLET | Freq: Four times a day (QID) | ORAL | Status: DC | PRN

## 2016-02-28 MED ORDER — SIMVASTATIN 20 MG PO TABS
20.0000 mg | ORAL_TABLET | Freq: Every day | ORAL | Status: DC
  Administered 2016-02-29 – 2016-03-05 (×7): 20 mg via ORAL
  Filled 2016-02-28 (×8): qty 1

## 2016-02-28 MED ORDER — POLYETHYLENE GLYCOL 3350 17 GM/SCOOP PO POWD: 34.0000 g | Freq: Every day | ORAL | Status: DC

## 2016-02-28 MED ORDER — VANCOMYCIN HCL IN DEXTROSE 1-5 GM/200ML-% IV SOLN: 1000.0000 mg | Freq: Once | INTRAVENOUS | Status: DC

## 2016-02-28 MED ORDER — OSELTAMIVIR PHOSPHATE 75 MG PO CAPS
75.0000 mg | ORAL_CAPSULE | Freq: Once | ORAL | Status: AC
  Administered 2016-02-28: 75 mg via ORAL
  Filled 2016-02-28: qty 1

## 2016-02-28 MED ORDER — SODIUM CHLORIDE 0.9 % IV BOLUS (SEPSIS): 1000.0000 mL | Freq: Once | INTRAVENOUS | Status: DC

## 2016-02-28 MED ORDER — TRAZODONE HCL 50 MG PO TABS: 25.0000 mg | ORAL_TABLET | Freq: Every evening | ORAL | Status: DC | PRN

## 2016-02-28 MED ORDER — INSULIN ASPART 100 UNIT/ML ~~LOC~~ SOLN
0.0000 [IU] | Freq: Three times a day (TID) | SUBCUTANEOUS | Status: DC
  Administered 2016-02-28 – 2016-02-29 (×2): 2 [IU] via SUBCUTANEOUS
  Administered 2016-02-29: 1 [IU] via SUBCUTANEOUS
  Administered 2016-02-29 – 2016-03-02 (×4): 2 [IU] via SUBCUTANEOUS
  Administered 2016-03-02: 3 [IU] via SUBCUTANEOUS
  Administered 2016-03-02: 1 [IU] via SUBCUTANEOUS
  Administered 2016-03-03 (×2): 3 [IU] via SUBCUTANEOUS
  Administered 2016-03-03 – 2016-03-04 (×2): 5 [IU] via SUBCUTANEOUS
  Administered 2016-03-04: 3 [IU] via SUBCUTANEOUS
  Administered 2016-03-04: 7 [IU] via SUBCUTANEOUS
  Administered 2016-03-05: 5 [IU] via SUBCUTANEOUS
  Administered 2016-03-05: 3 [IU] via SUBCUTANEOUS
  Administered 2016-03-05: 9 [IU] via SUBCUTANEOUS
  Administered 2016-03-06: 1 [IU] via SUBCUTANEOUS
  Filled 2016-02-28: qty 1

## 2016-02-28 MED ORDER — ALBUTEROL SULFATE (2.5 MG/3ML) 0.083% IN NEBU
2.5000 mg | INHALATION_SOLUTION | RESPIRATORY_TRACT | Status: DC | PRN
  Administered 2016-02-29: 2.5 mg via RESPIRATORY_TRACT
  Filled 2016-02-28: qty 3

## 2016-02-28 MED ORDER — ALBUTEROL (5 MG/ML) CONTINUOUS INHALATION SOLN
10.0000 mg/h | INHALATION_SOLUTION | Freq: Once | RESPIRATORY_TRACT | Status: AC
  Administered 2016-02-28: 10 mg/h via RESPIRATORY_TRACT
  Filled 2016-02-28: qty 20

## 2016-02-28 MED ORDER — DEXTROSE 5 % IV SOLN
1.0000 g | Freq: Three times a day (TID) | INTRAVENOUS | Status: DC
  Administered 2016-02-28 – 2016-03-04 (×16): 1 g via INTRAVENOUS
  Filled 2016-02-28 (×19): qty 1

## 2016-02-28 MED ORDER — FUROSEMIDE 10 MG/ML IJ SOLN
40.0000 mg | Freq: Two times a day (BID) | INTRAMUSCULAR | Status: AC
  Administered 2016-02-28 (×2): 40 mg via INTRAVENOUS
  Filled 2016-02-28 (×2): qty 4

## 2016-02-28 MED ORDER — OSELTAMIVIR PHOSPHATE 30 MG PO CAPS
30.0000 mg | ORAL_CAPSULE | Freq: Every day | ORAL | Status: AC
  Administered 2016-02-29 – 2016-03-03 (×4): 30 mg via ORAL
  Filled 2016-02-28 (×5): qty 1

## 2016-02-28 MED ORDER — SODIUM CHLORIDE 3 % IN NEBU
4.0000 mL | INHALATION_SOLUTION | Freq: Once | RESPIRATORY_TRACT | Status: AC
  Administered 2016-02-28: 4 mL via RESPIRATORY_TRACT
  Filled 2016-02-28: qty 4

## 2016-02-28 MED ORDER — VANCOMYCIN HCL 10 G IV SOLR
2000.0000 mg | Freq: Once | INTRAVENOUS | Status: AC
  Administered 2016-02-28: 2000 mg via INTRAVENOUS
  Filled 2016-02-28: qty 2000

## 2016-02-28 MED ORDER — MAGNESIUM CHLORIDE 64 MG PO TBEC
1.0000 | DELAYED_RELEASE_TABLET | Freq: Every day | ORAL | Status: DC
  Administered 2016-02-28 – 2016-03-06 (×8): 64 mg via ORAL
  Filled 2016-02-28 (×10): qty 1

## 2016-02-28 MED ORDER — DOXYCYCLINE HYCLATE 100 MG IV SOLR
100.0000 mg | Freq: Two times a day (BID) | INTRAVENOUS | Status: DC
  Administered 2016-02-28 – 2016-03-03 (×9): 100 mg via INTRAVENOUS
  Filled 2016-02-28 (×11): qty 100

## 2016-02-28 MED ORDER — POTASSIUM CHLORIDE CRYS ER 20 MEQ PO TBCR
40.0000 meq | EXTENDED_RELEASE_TABLET | Freq: Two times a day (BID) | ORAL | Status: AC
  Administered 2016-02-28 (×2): 40 meq via ORAL
  Filled 2016-02-28 (×2): qty 2

## 2016-02-28 MED ORDER — HEPARIN SODIUM (PORCINE) 5000 UNIT/ML IJ SOLN
5000.0000 [IU] | Freq: Three times a day (TID) | INTRAMUSCULAR | Status: DC
  Filled 2016-02-28: qty 1

## 2016-02-28 NOTE — H&P (Signed)
History and Physical    Lawrence Warner NID:[id number] DOB: 10-28-1918 DOA: 02/28/2016  PCP: Hoyt Koch, MD Patient coming from: retirement community - [facility]  Chief Complaint: increasing SOB  HPI: Lawrence Warner is a 83 y.o. male with medical history significant of CAD, s/p CABG, HTN, HLD, diet controlled DM, GERD who was seen recently (02/24/16) at [facility] for SOB, cough, malaise and body ache. He was clinically diagnosed with influenza, prescribed Tamiflu and Z-Pack and discharged home at the same day. Patient has his prescriptions filled through [place], so the family purchased Z-Pack, but never filled Tamiflu. Since discharge home he has been weak and dyspeneic, cough and SOB became progressively worse and he started having productive cough with yellow sputum This early morning patient developed tachypnea, became very short of breath and was brought to the ED  ED Course: On arrival he was tachycardic and tachypneic, diffuse wheezing with subcostal retractions. Patient was placed on BiPAP which improvement in symptoms Chest x-ray showed mild pulmonary venous congestion,  Interstitial lung disease with left base pleural scarring. Blood work showed mildly elevated BNP - 248.7, troponin of 0.11, mild hypokalemia 3.4 began of 32 and creatinine of 2.05 in patient with no prior history of chronic kidney disease Blood cells count was elevated at 12.1 with mild left shift. Influenza panel was ordered and patient tested positive for flu but the result was not recorded timely in the system so repeat flu test is pending  Review of Systems: Patient is currently on BiPAP and is unable to provide a full review of systems  Ambulatory Status: Independent  Past Medical History:  Diagnosis Date  . BPH (benign prostatic hypertrophy)   . Chronic renal insufficiency   . Coronary artery disease    CABG in 2000  . Diabetes type 2, controlled ([place])    Diet-controlled  .  Diabetic neuropathy ([place])   . Dyslipidemia   . Hypertension   . ITP (idiopathic thrombocytopenic purpura)   . Osteoarthritis     Past Surgical History:  Procedure Laterality Date  . CHOLECYSTECTOMY    . CORONARY ARTERY BYPASS GRAFT  2000  . Right hip replacement  2014  . TONSILLECTOMY      Social History   Social History  . Marital status: Single    Spouse name: N/A  . Number of children: N/A  . Years of education: N/A   Occupational History  . Salesman    Social History Main Topics  . Smoking status: Former Smoker    Packs/day: 2.00    Years: 25.00  . Smokeless tobacco: Never Used  . Alcohol use No  . Drug use: No  . Sexual activity: Not Currently   Other Topics Concern  . Not on file   Social History Narrative   Two children.      Allergies  Allergen Reactions  . Alfuzosin Hcl Er     confusion  . Penicillins Hives and Other (See Comments)    Has patient had a PCN reaction causing immediate rash, facial/tongue/throat swelling, SOB or lightheadedness with hypotension: No Has patient had a PCN reaction causing severe rash involving mucus membranes or skin necrosis: No Has patient had a PCN reaction that required hospitalization No Has patient had a PCN reaction occurring within the last 10 years: No If all of the above answers are "NO", then may proceed with Cephalosporin use.    Family History  Problem Relation Age of Onset  . Lung disease Father   .  Cancer Brother     Prior to Admission medications   Medication Sig Start Date End Date Taking? Authorizing Provider  azithromycin (ZITHROMAX Z-PAK) 250 MG tablet Take 1 tablet (250 mg total) by mouth daily. 2 pills (500mg ) day 1 then 1 pill (250mg ) days 2-5. 02/25/16  Yes Virgel Manifold, MD  acetaminophen (TYLENOL) 500 MG tablet Take 250-1,000 mg by mouth every 6 (six) hours as needed for mild pain, moderate pain or headache.     Historical Provider, MD  Ascorbic Acid (VITAMIN C) 100 MG tablet Take 100 mg by  mouth daily.     Historical Provider, MD  cholecalciferol (VITAMIN D) 1000 units tablet Take 1,000 Units by mouth daily.    Historical Provider, MD  losartan (COZAAR) 100 MG tablet Take 100 mg by mouth daily.     Historical Provider, MD  magnesium chloride (SLOW-MAG) 64 MG TBEC SR tablet Take 1 tablet (64 mg total) by mouth daily. 04/25/15   [name], MD  metoprolol tartrate (LOPRESSOR) 25 MG tablet Take 0.5 tablets (12.5 mg total) by mouth 2 (two) times daily. 10/09/15   Hoyt Koch, MD  nitroGLYCERIN (NITROSTAT) 0.4 MG SL tablet Place 1 tablet (0.4 mg total) under the tongue every 5 (five) minutes as needed for chest pain. 12/13/15 03/12/16  Minus Breeding, MD  omeprazole (PRILOSEC) 20 MG capsule Take 20 mg by mouth daily.    Historical Provider, MD  oseltamivir (TAMIFLU) 75 MG capsule Take 1 capsule (75 mg total) by mouth every 12 (twelve) hours. 02/25/16   Virgel Manifold, MD  polyethylene glycol powder (GLYCOLAX/MIRALAX) powder Take 34 g by mouth daily. 2 capfuls daily 12/07/15   Laban Emperor Zehr, PA-C  simvastatin (ZOCOR) 20 MG tablet Take 20 mg by mouth at bedtime.     Historical Provider, MD    Physical Exam: Vitals:   02/28/16 0945 02/28/16 0952 02/28/16 1000 02/28/16 1015  BP: (!) 93/49 (!) 107/41 (!) 106/46 (!) 117/35  Pulse: 109 107 107 104  Resp: 26 26 25 24   Temp:      TempSrc:      SpO2: 93% 90% 93% (!) 89%  Weight:         General: Appears calm, on BiPAP Eyes: PERRLA, EOMI, normal lids, iris ENT:  grossly normal hearing, lips & tongue, mucous membranes moist and intact Neck: no lymphoadenopathy, masses or thyromegaly Cardiovascular: RRR, no m/r/g. No JVD, carotid bruits. No LE edema.  Respiratory:diffuse  wheezes, rales, rhonchi. Normal respiratory effort. No accessory muscle use observed Abdomen: soft, non-tender, non-distended, no organomegaly or masses appreciated. BS present in all quadrants Skin: no rash, ulcers or induration seen on limited  exam Musculoskeletal: grossly normal tone BUE/BLE, good ROM, no bony abnormality or joint deformities observed Psychiatric: grossly normal mood and affect, speech fluent and appropriate, alert and oriented x3 Neurologic: CN II-XII grossly intact, moves all extremities in coordinated fashion, sensation intact  Labs on Admission: I have personally reviewed following labs and imaging studies  CBC, BMP  GFR: Estimated Creatinine Clearance: 23.1 mL/min (by C-G formula based on SCr of 2.16 mg/dL (H)).   Creatinine Clearance: Estimated Creatinine Clearance: 23.1 mL/min (by C-G formula based on SCr of 2.16 mg/dL (H)).    Radiological Exams on Admission: Dg Chest Port 1 View  Result Date: 02/28/2016 CLINICAL DATA:  Chest pain.  Shortness of breath. EXAM: PORTABLE CHEST 1 VIEW COMPARISON:  02/28/2016 02/25/2016.  10/05/2015.  04/11/2015. FINDINGS: Prior median sternotomy and CABG. Cardiomegaly. Mild pulmonary venous congestion.  Low lung volumes. Chronic interstitial changes are present. Stable left base pleural thickening noted consistent with scarring. No pneumothorax. IMPRESSION: 1. Prior median sternotomy. Cardiomegaly with mild pulmonary venous congestion. 2. Lung volumes with chronic interstitial lung disease. Left base pleural scarring. Electronically Signed   By: Marcello Moores  Register   On: 02/28/2016 07:30   Dg Chest Port 1 View  Result Date: 02/28/2016 CLINICAL DATA:  Cough, shortness of breath 9. Recent flu. History of diabetes, hypertension. EXAM: PORTABLE CHEST 1 VIEW COMPARISON:  Chest radiograph February 25, 2016 FINDINGS: Cardiomediastinal silhouette is normal, status post median sternotomy for CABG. Pulmonary vascular congestion. No pleural effusion or focal consolidation. Bibasilar strandy densities. No pneumothorax. Soft tissue planes included osseous structures are nonacute. RIGHT shoulder intra-articular calcifications can be seen with CPPD. IMPRESSION: Pulmonary vascular congestion and  bibasilar atelectasis. Electronically Signed   By: Elon Alas M.D.   On: 02/28/2016 04:31    EKG: Independently reviewed - tachycardia, questionable atrial flutter with left bundle branch block; difficult to interpret rhythm d/t accelerate HR  Assessment/Plan Principal Problem:   Acute respiratory failure with hypoxemia (HCC) Active Problems:   Diet-controlled diabetes mellitus (H[facility])   CAD (coronary artery disease)   Essential hypertension   Chest pain   GERD (gastroesophageal reflux disease)   HLD (hyperlipidemia)  Acute respiratory failure mostly likely secondary to Influenza Continue Tamiflu Continue IV vanco and doxycyline d/t severe allergy to PCN (anaphylacxis) Respiratory culture requested Influenza test (repeat) is pending Continue BiPAP, nebulizers scheduled and as needed  AKI - most likely associated with acute illness and poor oral intake of fluids  On admission creatinine was 2.5; continue to monitor renal indices, adjust medications to renal doses Hold Cozaar, he was not on diuretic at home  Tachycardia with elevated troponin and BNP in patient with known CAD, status post CABG  Chest x-ray showed mild vascular congestion - will give two doses of IV Lasix 40 mg Last echo from February 2017 showed mildly reduced EF 45-50% with abnormal septal motion and inferobasal hypokinesis Continue cycle troponins  Hypertension Cozaar is on hold due to AKI Continue Lopressor with parameters and monitor her blood pressure  Hyperlipidemia Continue statin if patient is able to tolerate oral intake  Hypokalemia Replace and recheck  Diet controlled DM Continue carb modified diet and add SSI  DVT prophylaxis: Heparin  Code Status: DO NOT RESUSCITATE Family Communication: daughter in law Disposition Plan: TBD Consults called: none Admission status: Inpatient   York Grice, [place] Pager: [phone] [facility]  If 7PM-7AM, please contact  night-coverage [email or web address] Password [email or web address]  02/28/2016, 10:54 AM

## 2016-02-28 NOTE — Progress Notes (Signed)
NIV started for respiratory distress.

## 2016-02-28 NOTE — ED Notes (Signed)
Dr Marily Memos took pt off bipap.

## 2016-02-28 NOTE — ED Notes (Signed)
Received call from admitting Md. Critical results given.

## 2016-02-28 NOTE — ED Notes (Signed)
MD Juliann Pulse made aware of critical troponin value.

## 2016-02-28 NOTE — ED Notes (Addendum)
[name] and Lawrence Warner [phone]

## 2016-02-28 NOTE — ED Notes (Signed)
Clarified with Dr. Marily Memos- pt is not to receive Heparin due to low platelets.

## 2016-02-28 NOTE — ED Notes (Signed)
Dr Kathrynn Humble placed pt on non-rebreather.

## 2016-02-28 NOTE — ED Notes (Signed)
CRITICAL VALUE ALERT  Critical value received:  Lactic acid 3.7  Date of notification:  02/28/2016   Time of notification:  1258  Critical value read back:Yes.    Nurse who received alert:  Leodis Rains  MD notified (1st page):   York Grice  Time of first page:  1005  No further orders at this time

## 2016-02-28 NOTE — Progress Notes (Signed)
Pt appears to be much more comfortable with his respirations. Pt SATs are 100% at this time.

## 2016-02-28 NOTE — ED Notes (Signed)
Ordered meal tray. 

## 2016-02-28 NOTE — ED Notes (Signed)
Informed MD of critical lab value

## 2016-02-28 NOTE — ED Triage Notes (Signed)
Pt to ED from [facility], a retirement facility, c/o increased shortness of breath. Was diagnosed with influenza on Monday. Has had worsening sob this morning with thick yellow sputum. Pt denies pain, tachypnic in rate of 30s. Rhonchi initially with EMS. 15 mg Albuterol and 1mg  Atrovent given enroute

## 2016-02-28 NOTE — Progress Notes (Signed)
Holding off BiPAP for now per MD. Waiting on nebs from pharmacy. RN at bedside at this time

## 2016-02-28 NOTE — ED Provider Notes (Signed)
[facility] DEPT Provider Note   CSN: [id number] Arrival date & time: 02/28/16  7829  By signing my name below, I, Ephriam Jenkins, attest that this documentation has been prepared under the direction and in the presence of Varney Biles, MD. Electronically signed, Ephriam Jenkins, ED Scribe. 02/28/16. 4:57 AM.  History   Chief Complaint Chief Complaint  Patient presents with  . Shortness of Breath    HPI HPI Comments: Lawrence Warner is a 83 y.o. male, with Hx of CAD, DM who presents to the Emergency Department complaining of shortness of breath that started three days ago and worsened yesterday. [facility] from [place] with worsened shortness of breath. Pt was seen here in the ED three days ago and was dx with influzena. Pt was discharged and started on Azithromycin and Tamaflu. Pt then started having worsening shortness of breath yesterday morning. He also notes a productive cough with thick yellow sputum, that has been persistent for the past three days. Pt does not normally wear supplemental O2 at home. No Hx of COPD. He denies any chest pain at this time.  The history is provided by the patient. No language interpreter was used.    Past Medical History:  Diagnosis Date  . BPH (benign prostatic hypertrophy)   . Chronic renal insufficiency   . Coronary artery disease    CABG in 2000  . Diabetes type 2, controlled ([place])    Diet-controlled  . Diabetic neuropathy ([place])   . Dyslipidemia   . Hypertension   . ITP (idiopathic thrombocytopenic purpura)   . Osteoarthritis     Patient Active Problem List   Diagnosis Date Noted  . Constipation 12/07/2015  . Bradycardia 11/28/2015  . Port catheter in place 10/24/2015  . GERD (gastroesophageal reflux disease) 10/09/2015  . Gait instability 10/09/2015  . Chest pain 10/05/2015  . Diet-controlled diabetes mellitus ([place]) 05/07/2015  . CAD (coronary artery disease) 05/07/2015  . Essential hypertension 05/07/2015  .  Muscle cramps 04/25/2015  . Shortness of breath 04/11/2015  . Idiopathic thrombocytopenic purpura ([place]) 09/27/2014    Past Surgical History:  Procedure Laterality Date  . CHOLECYSTECTOMY    . CORONARY ARTERY BYPASS GRAFT  2000  . Right hip replacement  2014  . TONSILLECTOMY       Home Medications    Prior to Admission medications   Medication Sig Start Date End Date Taking? Authorizing Provider  azithromycin (ZITHROMAX Z-PAK) 250 MG tablet Take 1 tablet (250 mg total) by mouth daily. 2 pills (500mg ) day 1 then 1 pill (250mg ) days 2-5. 02/25/16  Yes Virgel Manifold, MD  acetaminophen (TYLENOL) 500 MG tablet Take 250-1,000 mg by mouth every 6 (six) hours as needed for mild pain, moderate pain or headache.     Historical Provider, MD  Ascorbic Acid (VITAMIN C) 100 MG tablet Take 100 mg by mouth daily.     Historical Provider, MD  cholecalciferol (VITAMIN D) 1000 units tablet Take 1,000 Units by mouth daily.    Historical Provider, MD  losartan (COZAAR) 100 MG tablet Take 100 mg by mouth daily.     Historical Provider, MD  magnesium chloride (SLOW-MAG) 64 MG TBEC SR tablet Take 1 tablet (64 mg total) by mouth daily. 04/25/15   [name], MD  metoprolol tartrate (LOPRESSOR) 25 MG tablet Take 0.5 tablets (12.5 mg total) by mouth 2 (two) times daily. 10/09/15   Hoyt Koch, MD  nitroGLYCERIN (NITROSTAT) 0.4 MG SL tablet Place 1 tablet (0.4 mg total)  under the tongue every 5 (five) minutes as needed for chest pain. 12/13/15 03/12/16  Minus Breeding, MD  omeprazole (PRILOSEC) 20 MG capsule Take 20 mg by mouth daily.    Historical Provider, MD  oseltamivir (TAMIFLU) 75 MG capsule Take 1 capsule (75 mg total) by mouth every 12 (twelve) hours. 02/25/16   Virgel Manifold, MD  polyethylene glycol powder (GLYCOLAX/MIRALAX) powder Take 34 g by mouth daily. 2 capfuls daily 12/07/15   Laban Emperor Zehr, PA-C  simvastatin (ZOCOR) 20 MG tablet Take 20 mg by mouth at bedtime.     Historical  Provider, MD    Family History Family History  Problem Relation Age of Onset  . Lung disease Father   . Cancer Brother     Social History Social History  Substance Use Topics  . Smoking status: Former Smoker    Packs/day: 2.00    Years: 25.00  . Smokeless tobacco: Never Used  . Alcohol use No   Allergies   Alfuzosin hcl er and Penicillins   Review of Systems Review of Systems A complete 10 system review of systems was obtained and all systems are negative except as noted in the HPI and PMH.    Physical Exam Updated Vital Signs BP (!) 120/49   Pulse 116   Temp 98 F (36.7 C)   Resp (!) 32   SpO2 94%   Physical Exam  Constitutional: He is oriented to person, place, and time. He appears well-developed and well-nourished. No distress.  HENT:  Head: Normocephalic and atraumatic.  Neck: Normal range of motion.  Pulmonary/Chest: He is in respiratory distress. He has wheezes.  Diffuse wheezing. Respiratory distress with tachypnea. Subcostal retractions noted.  Musculoskeletal: He exhibits no edema.  No pitting edema to lower extremities. No unilateral calf swelling.  Neurological: He is alert and oriented to person, place, and time.  Skin: Skin is warm and dry. He is not diaphoretic.  Psychiatric: He has a normal mood and affect. Judgment normal.  Nursing note and vitals reviewed.   ED Treatments / Results  DIAGNOSTIC STUDIES: Oxygen Saturation is 94% on RA, normal by my interpretation.  COORDINATION OF CARE: 4:09 AM-Discussed treatment plan with pt at bedside and pt agreed to plan.   Labs (all labs ordered are listed, but only abnormal results are displayed) Labs Reviewed  BASIC METABOLIC PANEL - Abnormal; Notable for the following:       Result Value   Potassium 3.4 (*)    CO2 20 (*)    Glucose, Bld 214 (*)    BUN 32 (*)    Creatinine, Ser 2.05 (*)    GFR calc non Af Amer 26 (*)    GFR calc Af Amer 30 (*)    All other components within normal limits    CBC WITH DIFFERENTIAL/PLATELET - Abnormal; Notable for the following:    WBC 12.1 (*)    RBC 3.79 (*)    Hemoglobin 12.1 (*)    HCT 37.4 (*)    Platelets 86 (*)    Neutro Abs 8.1 (*)    Monocytes Absolute 1.6 (*)    All other components within normal limits  TROPONIN I - Abnormal; Notable for the following:    Troponin I 0.11 (*)    All other components within normal limits  BRAIN NATRIURETIC PEPTIDE - Abnormal; Notable for the following:    B Natriuretic Peptide 248.7 (*)    All other components within normal limits  CULTURE, EXPECTORATED SPUTUM-ASSESSMENT  INFLUENZA PANEL  BY PCR (TYPE A & B, H1N1)    EKG  Radiology Dg Chest Port 1 View  Result Date: 02/28/2016 CLINICAL DATA:  Cough, shortness of breath 9. Recent flu. History of diabetes, hypertension. EXAM: PORTABLE CHEST 1 VIEW COMPARISON:  Chest radiograph February 25, 2016 FINDINGS: Cardiomediastinal silhouette is normal, status post median sternotomy for CABG. Pulmonary vascular congestion. No pleural effusion or focal consolidation. Bibasilar strandy densities. No pneumothorax. Soft tissue planes included osseous structures are nonacute. RIGHT shoulder intra-articular calcifications can be seen with CPPD. IMPRESSION: Pulmonary vascular congestion and bibasilar atelectasis. Electronically Signed   By: Elon Alas M.D.   On: 02/28/2016 04:31    Procedures Procedures (including critical care time)  CRITICAL CARE Performed by: Varney Biles   Total critical care time: 45 minutes  Critical care time was exclusive of separately billable procedures and treating other patients.  Critical care was necessary to treat or prevent imminent or life-threatening deterioration.  Critical care was time spent personally by me on the following activities: development of treatment plan with patient and/or surrogate as well as nursing, discussions with consultants, evaluation of patient's response to treatment, examination of  patient, obtaining history from patient or surrogate, ordering and performing treatments and interventions, ordering and review of laboratory studies, ordering and review of radiographic studies, pulse oximetry and re-evaluation of patient's condition.   Medications Ordered in ED Medications  ipratropium-albuterol (DUONEB) 0.5-2.5 (3) MG/3ML nebulizer solution 3 mL (not administered)  oseltamivir (TAMIFLU) capsule 75 mg (not administered)  doxycycline (VIBRAMYCIN) 100 mg in dextrose 5 % 250 mL IVPB (100 mg Intravenous New Bag/Given 02/28/16 0637)  vancomycin (VANCOCIN) 2,000 mg in sodium chloride 0.9 % 500 mL IVPB (2,000 mg Intravenous New Bag/Given 02/28/16 0630)  vancomycin (VANCOCIN) IVPB 1000 mg/200 mL premix (not administered)  sodium chloride 0.9 % bolus 1,000 mL (not administered)  albuterol (PROVENTIL,VENTOLIN) solution continuous neb (10 mg/hr Nebulization Given 02/28/16 0438)  ipratropium (ATROVENT) nebulizer solution 0.5 mg (0.5 mg Nebulization Given 02/28/16 0438)  acetylcysteine (MUCOMYST) 20 % nebulizer / oral solution 4 mL (4 mLs Nebulization Given 02/28/16 0438)  sodium chloride HYPERTONIC 3 % nebulizer solution 4 mL (4 mLs Nebulization Given 02/28/16 0438)     Initial Impression / Assessment and Plan / ED Course  I have reviewed the triage vital signs and the nursing notes.  Pertinent labs & imaging results that were available during my care of the patient were reviewed by me and considered in my medical decision making (see chart for details).  Clinical Course    I personally performed the services described in this documentation, which was scribed in my presence. The recorded information has been reviewed and is accurate.  Pt comes in with cc of dyspnea. Hx of CAD, HTN. Pt was recently diagnosed with inflenza vs. Bronchitis and started on zpack and tamiflu. He started the former, but hasnt taken any tamiflu.  Pt is in resp distress. Lungs are rhonchus.  Differential diagnosis  includes: ACS syndrome CHF exacerbation Valvular disorder Myocarditis Pericarditis Pericardial effusion Pneumonia Pleural effusion Pulmonary edema PE  Biggest concerns are flu, CAP and pulm edema.  CXR shows no severe pulm edema - and so we think the symptoms are likely infectious.  Pt will be given tamiflu. No clear infiltrate seen on CXR - but we will give him antibiotics to cover CAP. Pt unfortunately has anaphylaxis listed as allergic reaction to penicillins and has prolonged QTc - so we will give him vanc and doxy. Doxy does have some  gram neg and atypical coverage.  Stepdown admission.  Code status: DNR/DNI.     Final Clinical Impressions(s) / ED Diagnoses   Final diagnoses:  Influenza  Acute hypoxemic respiratory failure ([place])  AKI (acute kidney injury) ([place])    New Prescriptions New Prescriptions   No medications on file     Varney Biles, MD 02/28/16 [phone]

## 2016-02-28 NOTE — Progress Notes (Addendum)
Pharmacy Antibiotic Note  Lawrence Warner is a 83 y.o. male admitted on 02/28/2016 with CAP.  Pharmacy has been consulted for Vancomycin dosing. Pt also on Doxycycline. Pt on Azithromycin x past 2 days. Pt with anaphylactic allergy to PCN (no history of cephalosporin use in EPIC - pt unsure if he has used in past). Pt also with prolonged QTc of 580 sec so Dr. Kathrynn Humble wants to avoid fluoroquinolones for now.  Plan: Vancomycin 2gm IV now then 1gm IV q24h Will f/u micro data, renal function, and pt's clinical condition Vanc trough prn Weight: 220 lb 7.4 oz (100 kg)  Temp (24hrs), Avg:98 F (36.7 C), Min:98 F (36.7 C), Max:98 F (36.7 C)   Recent Labs Lab 02/28/16 0429  WBC 12.1*  CREATININE 2.05*    Estimated Creatinine Clearance: 24.3 mL/min (by C-G formula based on SCr of 2.05 mg/dL (H)).    Allergies  Allergen Reactions  . Alfuzosin Hcl Er     confusion  . Penicillins Hives and Other (See Comments)    Has patient had a PCN reaction causing immediate rash, facial/tongue/throat swelling, SOB or lightheadedness with hypotension: No Has patient had a PCN reaction causing severe rash involving mucus membranes or skin necrosis: No Has patient had a PCN reaction that required hospitalization No Has patient had a PCN reaction occurring within the last 10 years: No If all of the above answers are "NO", then may proceed with Cephalosporin use.    Antimicrobials this admission: 1/3 Doxy >>  1/3 Vanc >>   Dose adjustments this admission: n/a  Microbiology results: Pending  Thank you for allowing pharmacy to be a part of this patient's care.  Sherlon Handing, PharmD, BCPS Clinical pharmacist, pager [phone] 02/28/2016 5:58 AM   Addendum: Adding aztreonam 1gm IV Q8H  Salome Arnt, PharmD, BCPS 02/28/2016 11:10 AM

## 2016-02-28 NOTE — ED Notes (Signed)
Assisted John(RN) transferring pt from stretcher to bed and applying condom cath on pt.

## 2016-02-28 NOTE — Telephone Encounter (Signed)
We cannot give new orders for home health due to no visit within 3 months. Appears he is in the ER at this time.

## 2016-02-28 NOTE — Progress Notes (Signed)
Sputum collected and sent to lab per RRT. 

## 2016-02-28 NOTE — ED Notes (Signed)
Transferred pt into hospital bed. Peri care performed. Placed condom cath on pt. Assisted pt with dinner.

## 2016-02-28 NOTE — Progress Notes (Signed)
Nebs running and bronchial hygiene being performed.

## 2016-02-28 NOTE — ED Notes (Signed)
Dr. Nanavati at bedside 

## 2016-02-29 ENCOUNTER — Ambulatory Visit: Payer: Medicare Other | Admitting: Internal Medicine

## 2016-02-29 LAB — COMPREHENSIVE METABOLIC PANEL
ALK PHOS: 47 U/L (ref 38–126)
ALT: 23 U/L (ref 17–63)
ANION GAP: 7 (ref 5–15)
AST: 49 U/L — ABNORMAL HIGH (ref 15–41)
Albumin: 2.9 g/dL — ABNORMAL LOW (ref 3.5–5.0)
BILIRUBIN TOTAL: 0.5 mg/dL (ref 0.3–1.2)
BUN: 29 mg/dL — ABNORMAL HIGH (ref 6–20)
CALCIUM: 8.7 mg/dL — AB (ref 8.9–10.3)
CO2: 24 mmol/L (ref 22–32)
Chloride: 107 mmol/L (ref 101–111)
Creatinine, Ser: 1.77 mg/dL — ABNORMAL HIGH (ref 0.61–1.24)
GFR calc non Af Amer: 31 mL/min — ABNORMAL LOW (ref >60)
GFR, EST AFRICAN AMERICAN: 36 mL/min — AB (ref >60)
Glucose, Bld: 146 mg/dL — ABNORMAL HIGH (ref 65–99)
POTASSIUM: 4.6 mmol/L (ref 3.5–5.1)
SODIUM: 138 mmol/L (ref 135–145)
TOTAL PROTEIN: 6.1 g/dL — AB (ref 6.5–8.1)

## 2016-02-29 LAB — GLUCOSE, CAPILLARY
GLUCOSE-CAPILLARY: 131 mg/dL — AB (ref 65–99)
GLUCOSE-CAPILLARY: 143 mg/dL — AB (ref 65–99)
GLUCOSE-CAPILLARY: 163 mg/dL — AB (ref 65–99)
Glucose-Capillary: 164 mg/dL — ABNORMAL HIGH (ref 65–99)
Glucose-Capillary: 177 mg/dL — ABNORMAL HIGH (ref 65–99)

## 2016-02-29 LAB — CBC
HCT: 33.6 % — ABNORMAL LOW (ref 39.0–52.0)
HEMOGLOBIN: 11 g/dL — AB (ref 13.0–17.0)
MCH: 32.5 pg (ref 26.0–34.0)
MCHC: 32.7 g/dL (ref 30.0–36.0)
MCV: 99.4 fL (ref 78.0–100.0)
Platelets: 73 10*3/uL — ABNORMAL LOW (ref 150–400)
RBC: 3.38 MIL/uL — AB (ref 4.22–5.81)
RDW: 13.4 % (ref 11.5–15.5)
WBC: 8.5 10*3/uL (ref 4.0–10.5)

## 2016-02-29 LAB — CBG MONITORING, ED: GLUCOSE-CAPILLARY: 156 mg/dL — AB (ref 65–99)

## 2016-02-29 LAB — MRSA PCR SCREENING: MRSA BY PCR: NEGATIVE

## 2016-02-29 LAB — LACTIC ACID, PLASMA: Lactic Acid, Venous: 0.9 mmol/L (ref 0.5–1.9)

## 2016-02-29 MED ORDER — ORAL CARE MOUTH RINSE
15.0000 mL | Freq: Two times a day (BID) | OROMUCOSAL | Status: DC
  Administered 2016-02-29 – 2016-03-06 (×8): 15 mL via OROMUCOSAL

## 2016-02-29 MED ORDER — DIAZEPAM 5 MG/ML IJ SOLN
2.5000 mg | Freq: Once | INTRAMUSCULAR | Status: AC
  Administered 2016-02-29: 2.5 mg via INTRAVENOUS
  Filled 2016-02-29: qty 2

## 2016-02-29 MED ORDER — ORAL CARE MOUTH RINSE: 15.0000 mL | Freq: Two times a day (BID) | OROMUCOSAL | Status: DC

## 2016-02-29 NOTE — Telephone Encounter (Signed)
Called [facility] and was unable to leave a message, Left message for Clarise Cruz that we are unable to put in orders due to no visit in the last 3 months, left number for her to call back if she has any questions.

## 2016-02-29 NOTE — Care Management Note (Addendum)
Case Management Note  Patient Details  Name: Lawrence Warner MRN: [id number] Date of Birth: 05-Oct-1918  Subjective/Objective:     Flu/PNA, lactic acidosis, acute resp failure with hypoxemia               Action/Plan: Discharge Planning: NCM spoke to pt and dtr-in-law, Lawrence Warner c [phone], h [phone] at bedside. Pt states he was getting HHPT and HHRN with [facility]. States his physical therapy ran out prior to close of year. Pt lives in his [place] apt. Was staying with dtr-in-law and son temp while he was sick this past week. He has reclining lift chair, rollator, scooter, and cane at home. Pt does not have neb machine or oxygen. Will check oxygen sats prior to dc to see if pt will need oxygen. [place] but pt will agreeable to SNF-rehab if recommended. Waiting PT/OT recommendations for home. [place] Liaison to make aware of admission.   PCP [place], [place]   3668 Message from [facility], that they were in process of getting new orders from PCP. Will need orders with F2F if dc home with HH.   Expected Discharge Date:                  Expected Discharge Plan:  [place]  In-House Referral:  Clinical Social Work  Discharge planning Services  CM Consult  Post Acute Care Choice:  Home Health Choice offered to:  Patient  DME Arranged:  N/A DME Agency:  NA  HH Arranged:  RN [place] Agency:  [facility]  Status of Service:  In process, will continue to follow  If discussed at Long Length of Stay Meetings, dates discussed:    Additional Comments:  Erenest Rasher, RN 02/29/2016, 3:13 PM

## 2016-02-29 NOTE — Progress Notes (Signed)
PROGRESS NOTE    Lawrence Warner  TGG:[id number] DOB: [birth year] DOA: 02/28/2016 PCP: Hoyt Koch, MD    Brief Narrative:  83 y.o. male who presents with sepsis likely secondary to pulmonary etiology from flu and possible bacterial infection. Patient is critically ill and requiring very close monitoring. Appears to be improving slowly. Elevated troponin likely secondary to cardiac strain from sepsis   Assessment & Plan:   Principal Problem:   Acute respiratory failure with hypoxemia (HCC) - secondary to infectious etiology from flu and possibly a superimposed bacterial pna - continue supportive therapy with supplemental oxygen as needed    Sepsis ([place]): secondary to infectious etiology at this could be mixed viral and bacterial, source lung - continue current therapy - f/u with cultures  Flu/PNA - continue current anti infectives, improving on this regimen   Lactic acidosis - Resolving  Elevated troponin - no chest pain reported today - most likely due to demand ischemia  Active Problems:   Diet-controlled diabetes mellitus (HCC) - SSI - carb modified diet    CAD (coronary artery disease) - stable no chest pain reported - continue aspirin, b blocker, and statin    Essential hypertension - on metroprolol    GERD (gastroesophageal reflux disease) - stable on PPI    HLD (hyperlipidemia) - Zocor, stable, no chest pain.    DVT prophylaxis: SCD's Code Status: DNR Family Communication: d/c son at bedside Disposition Plan: step down   Consultants:   None   Procedures: None   Antimicrobials: Aztreonam, doxycycline, tamiflu, vancomycin   Subjective: Pt complaining of back and leg cramps, no chest pain. no acute issues overnight. He feels better today.  Objective: Vitals:   02/29/16 0635 02/29/16 0746 02/29/16 1026 02/29/16 1034  BP:  (!) 156/56  122/69  Pulse:  84  89  Resp: (!) 24 18    Temp:  97.7 F (36.5 C)    TempSrc:  Oral    SpO2:  100% 100% 100%   Weight:      Height:        Intake/Output Summary (Last 24 hours) at 02/29/16 1110 Last data filed at 02/29/16 7035  Gross per 24 hour  Intake             2600 ml  Output             2300 ml  Net              300 ml   Filed Weights   02/28/16 0530 02/29/16 0510  Weight: 100 kg (220 lb 7.4 oz) 101 kg (222 lb 10.6 oz)    Examination:  General exam: Appears calm and comfortable  Respiratory system: rhales and exp wheezes, speaking In broken sentences, equal chest rise Cardiovascular system: S1 & S2 heard, IRR. No JVD, murmurs, rubs, gallops or clicks. No pedal edema. Gastrointestinal system: Abdomen is nondistended, soft and nontender. No organomegaly or masses felt. Normal bowel sounds heard. obese Central nervous system: Alert and oriented. No facial asymmetry Extremities: Symmetric 5 x 5 power. Skin: No rashes, lesions or ulcers, on limited exam. Psychiatry:  Mood & affect appropriate.     Data Reviewed: I have personally reviewed following labs and imaging studies  CBC:  Recent Labs Lab 02/28/16 0429 02/28/16 0913 02/28/16 1154 02/29/16 0220  WBC 12.1* 12.9* 11.6* 8.5  NEUTROABS 8.1*  --  9.2*  --   HGB 12.1* 10.7* 10.5* 11.0*  HCT 37.4* 33.6* 33.8* 33.6*  MCV 98.7 99.4 101.5*  99.4  PLT 86* 78* 74* 73*   Basic Metabolic Panel:  Recent Labs Lab 02/28/16 0429 02/28/16 0913 02/29/16 0220  NA 138  --  138  K 3.4*  --  4.6  CL 104  --  107  CO2 20*  --  24  GLUCOSE 214*  --  146*  BUN 32*  --  29*  CREATININE 2.05* 2.16* 1.77*  CALCIUM 9.1  --  8.7*  MG  --  1.9  --   PHOS  --  3.5  --    GFR: Estimated Creatinine Clearance: 28.3 mL/min (by C-G formula based on SCr of 1.77 mg/dL (H)). Liver Function Tests:  Recent Labs Lab 02/29/16 0220  AST 49*  ALT 23  ALKPHOS 47  BILITOT 0.5  PROT 6.1*  ALBUMIN 2.9*   No results for input(s): LIPASE, AMYLASE in the last 168 hours. No results for input(s): AMMONIA in the last 168  hours. Coagulation Profile:  Recent Labs Lab 02/28/16 1154  INR 1.02   Cardiac Enzymes:  Recent Labs Lab 02/28/16 0429 02/28/16 0913 02/28/16 1508 02/28/16 2138  TROPONINI 0.11* 0.14* 0.40* 0.68*   BNP (last 3 results) No results for input(s): PROBNP in the last 8760 hours. HbA1C: No results for input(s): HGBA1C in the last 72 hours. CBG:  Recent Labs Lab 02/28/16 1314 02/28/16 1718 02/29/16 0328 02/29/16 0520 02/29/16 0736  GLUCAP 167* 176* 156* 131* 143*   Lipid Profile: No results for input(s): CHOL, HDL, LDLCALC, TRIG, CHOLHDL, LDLDIRECT in the last 72 hours. Thyroid Function Tests: No results for input(s): TSH, T4TOTAL, FREET4, T3FREE, THYROIDAB in the last 72 hours. Anemia Panel: No results for input(s): VITAMINB12, FOLATE, FERRITIN, TIBC, IRON, RETICCTPCT in the last 72 hours. Sepsis Labs:  Recent Labs Lab 02/28/16 1154 02/28/16 1508 02/29/16 0656  PROCALCITON 0.22  --   --   LATICACIDVEN 3.7* 2.2* 0.9    Recent Results (from the past 240 hour(s))  Culture, expectorated sputum-assessment     Status: None   Collection Time: 02/28/16  4:22 AM  Result Value Ref Range Status   Specimen Description EXPECTORATED SPUTUM  Final   Special Requests NONE  Final   Sputum evaluation THIS SPECIMEN IS ACCEPTABLE FOR SPUTUM CULTURE  Final   Report Status 02/28/2016 FINAL  Final  Culture, respiratory (NON-Expectorated)     Status: None (Preliminary result)   Collection Time: 02/28/16  4:22 AM  Result Value Ref Range Status   Specimen Description EXPECTORATED SPUTUM  Final   Special Requests NONE Reflexed from [id number]  Final   Gram Stain   Final    MODERATE WBC PRESENT, PREDOMINANTLY PMN MODERATE GRAM POSITIVE COCCI FEW GRAM NEGATIVE RODS    Culture CULTURE REINCUBATED FOR BETTER GROWTH  Final   Report Status PENDING  Incomplete  MRSA PCR Screening     Status: None   Collection Time: 02/29/16  5:14 AM  Result Value Ref Range Status   MRSA by PCR NEGATIVE  NEGATIVE Final    Comment:        The GeneXpert MRSA Assay (FDA approved for NASAL specimens only), is one component of a comprehensive MRSA colonization surveillance program. It is not intended to diagnose MRSA infection nor to guide or monitor treatment for MRSA infections.          Radiology Studies: Dg Chest Port 1 View  Result Date: 02/28/2016 CLINICAL DATA:  Chest pain.  Shortness of breath. EXAM: PORTABLE CHEST 1 VIEW COMPARISON:  02/28/2016 02/25/2016.  10/05/2015.  04/11/2015. FINDINGS: Prior median sternotomy and CABG. Cardiomegaly. Mild pulmonary venous congestion. Low lung volumes. Chronic interstitial changes are present. Stable left base pleural thickening noted consistent with scarring. No pneumothorax. IMPRESSION: 1. Prior median sternotomy. Cardiomegaly with mild pulmonary venous congestion. 2. Lung volumes with chronic interstitial lung disease. Left base pleural scarring. Electronically Signed   By: Marcello Moores  Register   On: 02/28/2016 07:30   Dg Chest Port 1 View  Result Date: 02/28/2016 CLINICAL DATA:  Cough, shortness of breath 9. Recent flu. History of diabetes, hypertension. EXAM: PORTABLE CHEST 1 VIEW COMPARISON:  Chest radiograph February 25, 2016 FINDINGS: Cardiomediastinal silhouette is normal, status post median sternotomy for CABG. Pulmonary vascular congestion. No pleural effusion or focal consolidation. Bibasilar strandy densities. No pneumothorax. Soft tissue planes included osseous structures are nonacute. RIGHT shoulder intra-articular calcifications can be seen with CPPD. IMPRESSION: Pulmonary vascular congestion and bibasilar atelectasis. Electronically Signed   By: Elon Alas M.D.   On: 02/28/2016 04:31        Scheduled Meds: . aspirin EC  81 mg Oral Daily  . aztreonam  1 g Intravenous Q8H  . doxycycline (VIBRAMYCIN) IV  100 mg Intravenous Q12H  . insulin aspart  0-9 Units Subcutaneous TID WC  . ipratropium-albuterol  3 mL Nebulization Q4H   . LORazepam  0.5 mg Intravenous Once  . magnesium chloride  1 tablet Oral Daily  . mouth rinse  15 mL Mouth Rinse BID  . metoprolol tartrate  12.5 mg Oral BID  . oseltamivir  30 mg Oral Daily  . pantoprazole  40 mg Oral Daily  . polyethylene glycol  34 g Oral Daily  . romiPLOStim  50 mcg Subcutaneous Weekly  . simvastatin  20 mg Oral QHS  . sodium chloride flush  3 mL Intravenous Q12H  . vancomycin  1,000 mg Intravenous Q24H   Continuous Infusions:   LOS: 1 day    Time spent: > 35 minutes    Velvet Bathe, MD [facility] Pager [phone]  If 7PM-7AM, please contact night-coverage [email or web address] Password [email or web address] 02/29/2016, 11:10 AM

## 2016-02-29 NOTE — Telephone Encounter (Signed)
Patient was hospitalized for respiratory failure.

## 2016-03-01 LAB — BASIC METABOLIC PANEL
ANION GAP: 7 (ref 5–15)
BUN: 20 mg/dL (ref 6–20)
CALCIUM: 8.9 mg/dL (ref 8.9–10.3)
CO2: 24 mmol/L (ref 22–32)
CREATININE: 1.29 mg/dL — AB (ref 0.61–1.24)
Chloride: 104 mmol/L (ref 101–111)
GFR, EST AFRICAN AMERICAN: 53 mL/min — AB (ref >60)
GFR, EST NON AFRICAN AMERICAN: 45 mL/min — AB (ref >60)
Glucose, Bld: 200 mg/dL — ABNORMAL HIGH (ref 65–99)
Potassium: 4.4 mmol/L (ref 3.5–5.1)
Sodium: 135 mmol/L (ref 135–145)

## 2016-03-01 LAB — GLUCOSE, CAPILLARY
GLUCOSE-CAPILLARY: 181 mg/dL — AB (ref 65–99)
GLUCOSE-CAPILLARY: 181 mg/dL — AB (ref 65–99)
GLUCOSE-CAPILLARY: 113 mg/dL — AB (ref 65–99)
GLUCOSE-CAPILLARY: 137 mg/dL — AB (ref 65–99)

## 2016-03-01 LAB — CULTURE, RESPIRATORY W GRAM STAIN: Culture: NORMAL

## 2016-03-01 LAB — CBC
HEMATOCRIT: 32.2 % — AB (ref 39.0–52.0)
Hemoglobin: 10.2 g/dL — ABNORMAL LOW (ref 13.0–17.0)
MCH: 31.3 pg (ref 26.0–34.0)
MCHC: 31.7 g/dL (ref 30.0–36.0)
MCV: 98.8 fL (ref 78.0–100.0)
PLATELETS: 83 10*3/uL — AB (ref 150–400)
RBC: 3.26 MIL/uL — ABNORMAL LOW (ref 4.22–5.81)
RDW: 12.9 % (ref 11.5–15.5)
WBC: 6.9 10*3/uL (ref 4.0–10.5)

## 2016-03-01 LAB — PROCALCITONIN: PROCALCITONIN: 0.21 ng/mL

## 2016-03-01 NOTE — Progress Notes (Signed)
Pharmacy Antibiotic Note  Lawrence Warner is a 83 y.o. male admitted on 02/28/2016 with CAP.  Pharmacy has been consulted for aztreonam and vancomycin dosing. Pt also on Doxycycline.   Continues on broad spectrum abx for CAP coverage. Tamiflu for influenza A+ (renally adjusted). Pt was on Azithro PTA, so MD wants to avoid. Also with prolonged QTc so avoiding FQs. No record of cephalosporin use. Afebrile, WBC wnl.   Plan: Continue aztreonam 1g IV Q8 Continue vancomycin 1g IV Q24 Continue doxycycline 100mg  IV Q12 Continue Tamiflu 30 mg/24h (stop date 1/8) Monitor clinical picture, renal function, VT prn F/U C&S, abx deescalation / LOT  MD would like to continue current abx today Consider de-escalating abx soon  Height: 5\' 7"  (170.2 cm) Weight: 220 lb 14.4 oz (100.2 kg) IBW/kg (Calculated) : 66.1  Temp (24hrs), Avg:98.7 F (37.1 C), Min:98 F (36.7 C), Max:99.9 F (37.7 C)   Recent Labs Lab 02/28/16 0429 02/28/16 0913 02/28/16 1154 02/28/16 1508 02/29/16 0220 02/29/16 0656  WBC 12.1* 12.9* 11.6*  --  8.5  --   CREATININE 2.05* 2.16*  --   --  1.77*  --   LATICACIDVEN  --   --  3.7* 2.2*  --  0.9    Estimated Creatinine Clearance: 28.1 mL/min (by C-G formula based on SCr of 1.77 mg/dL (H)).    Allergies  Allergen Reactions  . Alfuzosin Hcl Er     confusion  . Penicillins Hives and Other (See Comments)    Has patient had a PCN reaction causing immediate rash, facial/tongue/throat swelling, SOB or lightheadedness with hypotension: No Has patient had a PCN reaction causing severe rash involving mucus membranes or skin necrosis: No Has patient had a PCN reaction that required hospitalization No Has patient had a PCN reaction occurring within the last 10 years: No If all of the above answers are "NO", then may proceed with Cephalosporin use.    Antimicrobials this admission:  Vanc 1/3 >> Azactam 1/3 >> Doxycycline 1/4 >> Tamiflu 1/3 >> (1/8)  Dose adjustments this  admission:  N/a  Microbiology results:  1/3 BCx >> ngtd 1/3 RCX >> normal flora 1/3 MRSA PCR >> neg  Thank you for allowing pharmacy to be a part of this patient's care.  Elenor Quinones, PharmD, BCPS Clinical Pharmacist Pager [phone] 03/01/2016 10:05 AM

## 2016-03-01 NOTE — Plan of Care (Signed)
Problem: Nutrition: Goal: Adequate nutrition will be maintained Outcome: Progressing Patient has a good appetite. Family brought patient food from home to encourage him to eat more.   Problem: Bowel/Gastric: Goal: Will not experience complications related to bowel motility Outcome: Progressing Patient currently constipated, but miralax given to foster a bowel movement.

## 2016-03-01 NOTE — Progress Notes (Signed)
PROGRESS NOTE    Lawrence Warner  JAS:[id number] DOB: 1918-05-16 DOA: 02/28/2016 PCP: Hoyt Koch, MD    Brief Narrative:  83 y.o. male who presents with sepsis likely secondary to pulmonary etiology from flu and possible bacterial infection. Patient is critically ill and requiring very close monitoring. Appears to be improving slowly. Elevated troponin likely secondary to cardiac strain from sepsis   Assessment & Plan:   Principal Problem:   Acute respiratory failure with hypoxemia (HCC) - secondary to infectious etiology from flu and possibly a superimposed bacterial pna - continue supportive therapy with supplemental oxygen as needed, improving    Sepsis ([place]): secondary to infectious etiology at this could be mixed viral and bacterial, source lung - continue current therapy - Blood cultures negative to date  Flu/PNA - continue current anti infectives, improving on this regimen   Lactic acidosis - Resolved  Elevated troponin - no chest pain reported today - most likely due to demand ischemia  Active Problems:   Diet-controlled diabetes mellitus (HCC) - SSI - carb modified diet    CAD (coronary artery disease) - stable no chest pain reported - continue aspirin, b blocker, and statin    Essential hypertension - on metroprolol    GERD (gastroesophageal reflux disease) - stable on PPI    HLD (hyperlipidemia) - Zocor, stable, no chest pain.    DVT prophylaxis: SCD's Code Status: DNR Family Communication: d/c son at bedside Disposition Plan: step down again. Most likely transition to floor next am.   Consultants:   None   Procedures: None   Antimicrobials: Aztreonam, doxycycline, tamiflu, vancomycin   Subjective:  He feels better today. Breathing condition improved  Objective: Vitals:   03/01/16 0400 03/01/16 0415 03/01/16 0700 03/01/16 0736  BP: (!) 147/58  (!) 151/58   Pulse: 88 89 87   Resp: 18 20 (!) 24   Temp: 99.9 F (37.7 C)   98.5 F (36.9 C)   TempSrc: Axillary  Axillary   SpO2: 91% 100% 99% 98%  Weight: 100.2 kg (220 lb 14.4 oz)     Height: 5\' 7"  (1.702 m)       Intake/Output Summary (Last 24 hours) at 03/01/16 0959 Last data filed at 03/01/16 0700  Gross per 24 hour  Intake              650 ml  Output             2000 ml  Net            -1350 ml   Filed Weights   02/28/16 0530 02/29/16 0510 03/01/16 0400  Weight: 100 kg (220 lb 7.4 oz) 101 kg (222 lb 10.6 oz) 100.2 kg (220 lb 14.4 oz)    Examination:  General exam: Appears calm and comfortable, in nad. Respiratory system: rhales and exp wheezes, speaking In broken sentences, equal chest rise Cardiovascular system: S1 & S2 heard, IRR. No JVD, murmurs, rubs, gallops or clicks. No pedal edema. Gastrointestinal system: Abdomen is nondistended, soft and nontender. No organomegaly or masses felt. Normal bowel sounds heard. obese Central nervous system: Alert and oriented. No facial asymmetry Extremities: Symmetric 5 x 5 power. Skin: No rashes, lesions or ulcers, on limited exam. Psychiatry:  Mood & affect appropriate.   Data Reviewed: I have personally reviewed following labs and imaging studies  CBC:  Recent Labs Lab 02/28/16 0429 02/28/16 0913 02/28/16 1154 02/29/16 0220  WBC 12.1* 12.9* 11.6* 8.5  NEUTROABS 8.1*  --  9.2*  --  HGB 12.1* 10.7* 10.5* 11.0*  HCT 37.4* 33.6* 33.8* 33.6*  MCV 98.7 99.4 101.5* 99.4  PLT 86* 78* 74* 73*   Basic Metabolic Panel:  Recent Labs Lab 02/28/16 0429 02/28/16 0913 02/29/16 0220  NA 138  --  138  K 3.4*  --  4.6  CL 104  --  107  CO2 20*  --  24  GLUCOSE 214*  --  146*  BUN 32*  --  29*  CREATININE 2.05* 2.16* 1.77*  CALCIUM 9.1  --  8.7*  MG  --  1.9  --   PHOS  --  3.5  --    GFR: Estimated Creatinine Clearance: 28.1 mL/min (by C-G formula based on SCr of 1.77 mg/dL (H)). Liver Function Tests:  Recent Labs Lab 02/29/16 0220  AST 49*  ALT 23  ALKPHOS 47  BILITOT 0.5  PROT 6.1*   ALBUMIN 2.9*   No results for input(s): LIPASE, AMYLASE in the last 168 hours. No results for input(s): AMMONIA in the last 168 hours. Coagulation Profile:  Recent Labs Lab 02/28/16 1154  INR 1.02   Cardiac Enzymes:  Recent Labs Lab 02/28/16 0429 02/28/16 0913 02/28/16 1508 02/28/16 2138  TROPONINI 0.11* 0.14* 0.40* 0.68*   BNP (last 3 results) No results for input(s): PROBNP in the last 8760 hours. HbA1C: No results for input(s): HGBA1C in the last 72 hours. CBG:  Recent Labs Lab 02/29/16 0736 02/29/16 1208 02/29/16 1703 02/29/16 2127 03/01/16 0723  GLUCAP 143* 163* 164* 177* 181*   Lipid Profile: No results for input(s): CHOL, HDL, LDLCALC, TRIG, CHOLHDL, LDLDIRECT in the last 72 hours. Thyroid Function Tests: No results for input(s): TSH, T4TOTAL, FREET4, T3FREE, THYROIDAB in the last 72 hours. Anemia Panel: No results for input(s): VITAMINB12, FOLATE, FERRITIN, TIBC, IRON, RETICCTPCT in the last 72 hours. Sepsis Labs:  Recent Labs Lab 02/28/16 1154 02/28/16 1508 02/29/16 0656 03/01/16 0613  PROCALCITON 0.22  --   --  0.21  LATICACIDVEN 3.7* 2.2* 0.9  --     Recent Results (from the past 240 hour(s))  Culture, expectorated sputum-assessment     Status: None   Collection Time: 02/28/16  4:22 AM  Result Value Ref Range Status   Specimen Description EXPECTORATED SPUTUM  Final   Special Requests NONE  Final   Sputum evaluation THIS SPECIMEN IS ACCEPTABLE FOR SPUTUM CULTURE  Final   Report Status 02/28/2016 FINAL  Final  Culture, respiratory (NON-Expectorated)     Status: None   Collection Time: 02/28/16  4:22 AM  Result Value Ref Range Status   Specimen Description EXPECTORATED SPUTUM  Final   Special Requests NONE Reflexed from [id number]  Final   Gram Stain   Final    MODERATE WBC PRESENT, PREDOMINANTLY PMN MODERATE GRAM POSITIVE COCCI FEW GRAM NEGATIVE RODS    Culture Consistent with normal respiratory flora.  Final   Report Status 03/01/2016  FINAL  Final  Culture, blood (x 2)     Status: None (Preliminary result)   Collection Time: 02/28/16 11:59 AM  Result Value Ref Range Status   Specimen Description BLOOD RIGHT ANTECUBITAL  Final   Special Requests BOTTLES DRAWN AEROBIC AND ANAEROBIC 5CC  Final   Culture NO GROWTH 1 DAY  Final   Report Status PENDING  Incomplete  Culture, blood (x 2)     Status: None (Preliminary result)   Collection Time: 02/28/16 12:06 PM  Result Value Ref Range Status   Specimen Description BLOOD LEFT ANTECUBITAL  Final  Special Requests BOTTLES DRAWN AEROBIC AND ANAEROBIC 5CC  Final   Culture NO GROWTH 1 DAY  Final   Report Status PENDING  Incomplete  MRSA PCR Screening     Status: None   Collection Time: 02/29/16  5:14 AM  Result Value Ref Range Status   MRSA by PCR NEGATIVE NEGATIVE Final    Comment:        The GeneXpert MRSA Assay (FDA approved for NASAL specimens only), is one component of a comprehensive MRSA colonization surveillance program. It is not intended to diagnose MRSA infection nor to guide or monitor treatment for MRSA infections.          Radiology Studies: No results found.      Scheduled Meds: . aspirin EC  81 mg Oral Daily  . aztreonam  1 g Intravenous Q8H  . doxycycline (VIBRAMYCIN) IV  100 mg Intravenous Q12H  . insulin aspart  0-9 Units Subcutaneous TID WC  . ipratropium-albuterol  3 mL Nebulization Q4H  . LORazepam  0.5 mg Intravenous Once  . magnesium chloride  1 tablet Oral Daily  . mouth rinse  15 mL Mouth Rinse BID  . metoprolol tartrate  12.5 mg Oral BID  . oseltamivir  30 mg Oral Daily  . pantoprazole  40 mg Oral Daily  . polyethylene glycol  34 g Oral Daily  . romiPLOStim  50 mcg Subcutaneous Weekly  . simvastatin  20 mg Oral QHS  . sodium chloride flush  3 mL Intravenous Q12H  . vancomycin  1,000 mg Intravenous Q24H   Continuous Infusions:   LOS: 2 days    Time spent: > 35 minutes    Velvet Bathe, MD [facility] Pager  [phone]  If 7PM-7AM, please contact night-coverage [email or web address] Password [email or web address] 03/01/2016, 9:59 AM

## 2016-03-02 LAB — BASIC METABOLIC PANEL
Anion gap: 7 (ref 5–15)
BUN: 20 mg/dL (ref 6–20)
CHLORIDE: 102 mmol/L (ref 101–111)
CO2: 27 mmol/L (ref 22–32)
CREATININE: 1.38 mg/dL — AB (ref 0.61–1.24)
Calcium: 9 mg/dL (ref 8.9–10.3)
GFR calc Af Amer: 48 mL/min — ABNORMAL LOW (ref >60)
GFR calc non Af Amer: 42 mL/min — ABNORMAL LOW (ref >60)
GLUCOSE: 133 mg/dL — AB (ref 65–99)
Potassium: 4.8 mmol/L (ref 3.5–5.1)
SODIUM: 136 mmol/L (ref 135–145)

## 2016-03-02 LAB — GLUCOSE, CAPILLARY
GLUCOSE-CAPILLARY: 145 mg/dL — AB (ref 65–99)
GLUCOSE-CAPILLARY: 208 mg/dL — AB (ref 65–99)
Glucose-Capillary: 151 mg/dL — ABNORMAL HIGH (ref 65–99)
Glucose-Capillary: 186 mg/dL — ABNORMAL HIGH (ref 65–99)

## 2016-03-02 MED ORDER — METHYLPREDNISOLONE SODIUM SUCC 125 MG IJ SOLR
60.0000 mg | Freq: Two times a day (BID) | INTRAMUSCULAR | Status: DC
  Administered 2016-03-02 – 2016-03-05 (×6): 60 mg via INTRAVENOUS
  Filled 2016-03-02 (×6): qty 2

## 2016-03-02 NOTE — Progress Notes (Signed)
Transferred to 6E25 via bed. Family @ bedside. Assessment as charted.

## 2016-03-02 NOTE — Progress Notes (Signed)
Report given to Abigail Butts, RN on 6E. Patient transferred via bed to 6E25. Belongings sent with patient including upper/lower dentures. Patient family at bedside and aware of transfer. Patient will no longer be on telemetry- CCMD notified.  Milford Cage, RN

## 2016-03-02 NOTE — Progress Notes (Signed)
RT placed pt on 3L [place]. Pt tol well

## 2016-03-02 NOTE — Progress Notes (Signed)
PROGRESS NOTE    Lawrence Warner  BLT:[id number] DOB: 1918/05/22 DOA: 02/28/2016 PCP: Hoyt Koch, MD    Brief Narrative:  83 y.o. male who presents with sepsis likely secondary to pulmonary etiology from flu and possible bacterial infection. Patient is critically ill and requiring very close monitoring. Appears to be improving slowly. Elevated troponin likely secondary to cardiac strain from sepsis   Assessment & Plan:   Principal Problem:   Acute respiratory failure with hypoxemia (HCC) - secondary to infectious etiology from flu and possibly a superimposed bacterial pna - continue supportive therapy with supplemental oxygen as needed, improving as such will transfer out of step down unit    Sepsis [facility]): secondary to infectious etiology at this could be mixed viral and bacterial, source lung - continue current therapy - Blood cultures negative to date  Flu/PNA - continue current anti infectives, improving on this regimen   Lactic acidosis - Resolved  Elevated troponin - no chest pain reported today - most likely due to demand ischemia  Active Problems:   Diet-controlled diabetes mellitus (HCC) - SSI - carb modified diet    CAD (coronary artery disease) - stable no chest pain reported - continue aspirin, b blocker, and statin    Essential hypertension - on metroprolol    GERD (gastroesophageal reflux disease) - stable on PPI    HLD (hyperlipidemia) - Zocor, stable, no chest pain.    DVT prophylaxis: SCD's Code Status: DNR Family Communication: no family at bedside Disposition Plan: tx to medsurg   Consultants:   None   Procedures: None   Antimicrobials: Aztreonam, doxycycline, tamiflu, vancomycin   Subjective:  Pt has no new complaints. Feels like his breathing condition is improved   Objective: Vitals:   03/02/16 0700 03/02/16 0745 03/02/16 0827 03/02/16 0834  BP: (!) 155/69   (!) 143/50  Pulse: 74   68  Resp: 19   (!) 25    Temp:      TempSrc:      SpO2: 91% 92% 90% 99%  Weight:      Height:        Intake/Output Summary (Last 24 hours) at 03/02/16 1033 Last data filed at 03/02/16 0508  Gross per 24 hour  Intake              853 ml  Output              950 ml  Net              -97 ml   Filed Weights   02/29/16 0510 03/01/16 0400 03/02/16 0500  Weight: 101 kg (222 lb 10.6 oz) 100.2 kg (220 lb 14.4 oz) 99.5 kg (219 lb 5.7 oz)    Examination:  General exam: Appears calm and comfortable, in nad. Respiratory system: rhales and exp wheezes, speaking In broken sentences, equal chest rise Cardiovascular system: S1 & S2 heard, IRR. No JVD, murmurs, rubs, gallops or clicks. No pedal edema. Gastrointestinal system: Abdomen is nondistended, soft and nontender. No organomegaly or masses felt. Normal bowel sounds heard. obese Central nervous system: Alert and oriented. No facial asymmetry Extremities: Symmetric 5 x 5 power. Skin: No rashes, lesions or ulcers, on limited exam. Psychiatry:  Mood & affect appropriate.   Data Reviewed: I have personally reviewed following labs and imaging studies  CBC:  Recent Labs Lab 02/28/16 0429 02/28/16 0913 02/28/16 1154 02/29/16 0220 03/01/16 1041  WBC 12.1* 12.9* 11.6* 8.5 6.9  NEUTROABS 8.1*  --  9.2*  --   --  HGB 12.1* 10.7* 10.5* 11.0* 10.2*  HCT 37.4* 33.6* 33.8* 33.6* 32.2*  MCV 98.7 99.4 101.5* 99.4 98.8  PLT 86* 78* 74* 73* 83*   Basic Metabolic Panel:  Recent Labs Lab 02/28/16 0429 02/28/16 0913 02/29/16 0220 03/01/16 1041 03/02/16 0316  NA 138  --  138 135 136  K 3.4*  --  4.6 4.4 4.8  CL 104  --  107 104 102  CO2 20*  --  24 24 27   GLUCOSE 214*  --  146* 200* 133*  BUN 32*  --  29* 20 20  CREATININE 2.05* 2.16* 1.77* 1.29* 1.38*  CALCIUM 9.1  --  8.7* 8.9 9.0  MG  --  1.9  --   --   --   PHOS  --  3.5  --   --   --    GFR: Estimated Creatinine Clearance: 36 mL/min (by C-G formula based on SCr of 1.38 mg/dL (H)). Liver Function  Tests:  Recent Labs Lab 02/29/16 0220  AST 49*  ALT 23  ALKPHOS 47  BILITOT 0.5  PROT 6.1*  ALBUMIN 2.9*   No results for input(s): LIPASE, AMYLASE in the last 168 hours. No results for input(s): AMMONIA in the last 168 hours. Coagulation Profile:  Recent Labs Lab 02/28/16 1154  INR 1.02   Cardiac Enzymes:  Recent Labs Lab 02/28/16 0429 02/28/16 0913 02/28/16 1508 02/28/16 2138  TROPONINI 0.11* 0.14* 0.40* 0.68*   BNP (last 3 results) No results for input(s): PROBNP in the last 8760 hours. HbA1C: No results for input(s): HGBA1C in the last 72 hours. CBG:  Recent Labs Lab 03/01/16 0723 03/01/16 1156 03/01/16 1620 03/01/16 2219 03/02/16 0930  GLUCAP 181* 181* 113* 137* 151*   Lipid Profile: No results for input(s): CHOL, HDL, LDLCALC, TRIG, CHOLHDL, LDLDIRECT in the last 72 hours. Thyroid Function Tests: No results for input(s): TSH, T4TOTAL, FREET4, T3FREE, THYROIDAB in the last 72 hours. Anemia Panel: No results for input(s): VITAMINB12, FOLATE, FERRITIN, TIBC, IRON, RETICCTPCT in the last 72 hours. Sepsis Labs:  Recent Labs Lab 02/28/16 1154 02/28/16 1508 02/29/16 0656 03/01/16 0613  PROCALCITON 0.22  --   --  0.21  LATICACIDVEN 3.7* 2.2* 0.9  --     Recent Results (from the past 240 hour(s))  Culture, expectorated sputum-assessment     Status: None   Collection Time: 02/28/16  4:22 AM  Result Value Ref Range Status   Specimen Description EXPECTORATED SPUTUM  Final   Special Requests NONE  Final   Sputum evaluation THIS SPECIMEN IS ACCEPTABLE FOR SPUTUM CULTURE  Final   Report Status 02/28/2016 FINAL  Final  Culture, respiratory (NON-Expectorated)     Status: None   Collection Time: 02/28/16  4:22 AM  Result Value Ref Range Status   Specimen Description EXPECTORATED SPUTUM  Final   Special Requests NONE Reflexed from [id number]  Final   Gram Stain   Final    MODERATE WBC PRESENT, PREDOMINANTLY PMN MODERATE GRAM POSITIVE COCCI FEW GRAM  NEGATIVE RODS    Culture Consistent with normal respiratory flora.  Final   Report Status 03/01/2016 FINAL  Final  Culture, blood (x 2)     Status: None (Preliminary result)   Collection Time: 02/28/16 11:59 AM  Result Value Ref Range Status   Specimen Description BLOOD RIGHT ANTECUBITAL  Final   Special Requests BOTTLES DRAWN AEROBIC AND ANAEROBIC 5CC  Final   Culture NO GROWTH 2 DAYS  Final   Report Status PENDING  Incomplete  Culture, blood (x 2)     Status: None (Preliminary result)   Collection Time: 02/28/16 12:06 PM  Result Value Ref Range Status   Specimen Description BLOOD LEFT ANTECUBITAL  Final   Special Requests BOTTLES DRAWN AEROBIC AND ANAEROBIC 5CC  Final   Culture NO GROWTH 2 DAYS  Final   Report Status PENDING  Incomplete  MRSA PCR Screening     Status: None   Collection Time: 02/29/16  5:14 AM  Result Value Ref Range Status   MRSA by PCR NEGATIVE NEGATIVE Final    Comment:        The GeneXpert MRSA Assay (FDA approved for NASAL specimens only), is one component of a comprehensive MRSA colonization surveillance program. It is not intended to diagnose MRSA infection nor to guide or monitor treatment for MRSA infections.      Radiology Studies: No results found.  Scheduled Meds: . aspirin EC  81 mg Oral Daily  . aztreonam  1 g Intravenous Q8H  . doxycycline (VIBRAMYCIN) IV  100 mg Intravenous Q12H  . insulin aspart  0-9 Units Subcutaneous TID WC  . ipratropium-albuterol  3 mL Nebulization Q4H  . LORazepam  0.5 mg Intravenous Once  . magnesium chloride  1 tablet Oral Daily  . mouth rinse  15 mL Mouth Rinse BID  . metoprolol tartrate  12.5 mg Oral BID  . oseltamivir  30 mg Oral Daily  . pantoprazole  40 mg Oral Daily  . polyethylene glycol  34 g Oral Daily  . romiPLOStim  50 mcg Subcutaneous Weekly  . simvastatin  20 mg Oral QHS  . sodium chloride flush  3 mL Intravenous Q12H  . vancomycin  1,000 mg Intravenous Q24H   Continuous Infusions:   LOS:  3 days   Time spent: > 35 minutes  Velvet Bathe, MD [facility] Pager [phone]  If 7PM-7AM, please contact night-coverage [email or web address] Password [email or web address] 03/02/2016, 10:33 AM

## 2016-03-03 LAB — GLUCOSE, CAPILLARY
GLUCOSE-CAPILLARY: 251 mg/dL — AB (ref 65–99)
GLUCOSE-CAPILLARY: 220 mg/dL — AB (ref 65–99)
Glucose-Capillary: 228 mg/dL — ABNORMAL HIGH (ref 65–99)
Glucose-Capillary: 249 mg/dL — ABNORMAL HIGH (ref 65–99)

## 2016-03-03 LAB — PROCALCITONIN: Procalcitonin: <0.1 ng/mL

## 2016-03-03 MED ORDER — ACETAMINOPHEN 325 MG PO TABS: 650.0000 mg | ORAL_TABLET | ORAL | Status: DC | PRN

## 2016-03-03 MED ORDER — DOXYCYCLINE HYCLATE 100 MG PO TABS
100.0000 mg | ORAL_TABLET | Freq: Two times a day (BID) | ORAL | Status: DC
  Administered 2016-03-03 – 2016-03-06 (×6): 100 mg via ORAL
  Filled 2016-03-03 (×6): qty 1

## 2016-03-03 NOTE — Progress Notes (Signed)
PROGRESS NOTE    Lawrence Warner  KVQ:[id number] DOB: 1918-11-28 DOA: 02/28/2016 PCP: Hoyt Koch, MD    Brief Narrative:  83 y.o. male who presents with sepsis likely secondary to pulmonary etiology from flu and possible bacterial infection. Patient is critically ill and requiring very close monitoring. Appears to be improving slowly. Elevated troponin likely secondary to cardiac strain from sepsis   Assessment & Plan:   Principal Problem:   Acute respiratory failure with hypoxemia (HCC) - secondary to infectious etiology from flu and possibly a superimposed bacterial pna - improving will start to mobilize patient.    Sepsis ([place]): secondary to infectious etiology at this could be mixed viral and bacterial, source lung - continue current therapy - Blood cultures negative to date  Flu/PNA - continue current anti infectives, improving on this regimen   Lactic acidosis - Resolved  Elevated troponin - no chest pain reported - most likely due to demand ischemia  Active Problems:   Diet-controlled diabetes mellitus (HCC) - SSI - carb modified diet    CAD (coronary artery disease) - stable no chest pain reported - continue aspirin, b blocker, and statin    Essential hypertension - on metroprolol    GERD (gastroesophageal reflux disease) - stable on PPI    HLD (hyperlipidemia) - Zocor, stable, no chest pain.    DVT prophylaxis: SCD's Code Status: DNR Family Communication: no family at bedside Disposition Plan: tx to medsurg   Consultants:   None   Procedures: None   Antimicrobials: Aztreonam, doxycycline, tamiflu, vancomycin   Subjective:  Pt has no new complaints.   Objective: Vitals:   03/03/16 0915 03/03/16 1041 03/03/16 1620 03/03/16 1643  BP:  (!) 154/59 (!) 155/60   Pulse:  64 63   Resp:  20 18   Temp:  98 F (36.7 C) 98.2 F (36.8 C)   TempSrc:  Oral Oral   SpO2: 97% 93% 94% 93%  Weight:      Height:        Intake/Output  Summary (Last 24 hours) at 03/03/16 1650 Last data filed at 03/03/16 1300  Gross per 24 hour  Intake              600 ml  Output              550 ml  Net               50 ml   Filed Weights   03/02/16 0500 03/02/16 1616 03/02/16 2107  Weight: 99.5 kg (219 lb 5.7 oz) 93 kg (205 lb 0.4 oz) 93 kg (205 lb 0.4 oz)    Examination:  General exam: Appears calm and comfortable, in nad. Respiratory system: rhales and exp wheezes, speaking In broken sentences, equal chest rise Cardiovascular system: S1 & S2 heard, IRR. No JVD, murmurs, rubs, gallops or clicks. No pedal edema. Gastrointestinal system: Abdomen is nondistended, soft and nontender. No organomegaly or masses felt. Normal bowel sounds heard. obese Central nervous system: Alert and oriented. No facial asymmetry Extremities: Symmetric 5 x 5 power. Skin: No rashes, lesions or ulcers, on limited exam. Psychiatry:  Mood & affect appropriate.   Data Reviewed: I have personally reviewed following labs and imaging studies  CBC:  Recent Labs Lab 02/28/16 0429 02/28/16 0913 02/28/16 1154 02/29/16 0220 03/01/16 1041  WBC 12.1* 12.9* 11.6* 8.5 6.9  NEUTROABS 8.1*  --  9.2*  --   --   HGB 12.1* 10.7* 10.5* 11.0* 10.2*  HCT  37.4* 33.6* 33.8* 33.6* 32.2*  MCV 98.7 99.4 101.5* 99.4 98.8  PLT 86* 78* 74* 73* 83*   Basic Metabolic Panel:  Recent Labs Lab 02/28/16 0429 02/28/16 0913 02/29/16 0220 03/01/16 1041 03/02/16 0316  NA 138  --  138 135 136  K 3.4*  --  4.6 4.4 4.8  CL 104  --  107 104 102  CO2 20*  --  24 24 27   GLUCOSE 214*  --  146* 200* 133*  BUN 32*  --  29* 20 20  CREATININE 2.05* 2.16* 1.77* 1.29* 1.38*  CALCIUM 9.1  --  8.7* 8.9 9.0  MG  --  1.9  --   --   --   PHOS  --  3.5  --   --   --    GFR: Estimated Creatinine Clearance: 34.8 mL/min (by C-G formula based on SCr of 1.38 mg/dL (H)). Liver Function Tests:  Recent Labs Lab 02/29/16 0220  AST 49*  ALT 23  ALKPHOS 47  BILITOT 0.5  PROT 6.1*    ALBUMIN 2.9*   No results for input(s): LIPASE, AMYLASE in the last 168 hours. No results for input(s): AMMONIA in the last 168 hours. Coagulation Profile:  Recent Labs Lab 02/28/16 1154  INR 1.02   Cardiac Enzymes:  Recent Labs Lab 02/28/16 0429 02/28/16 0913 02/28/16 1508 02/28/16 2138  TROPONINI 0.11* 0.14* 0.40* 0.68*   BNP (last 3 results) No results for input(s): PROBNP in the last 8760 hours. HbA1C: No results for input(s): HGBA1C in the last 72 hours. CBG:  Recent Labs Lab 03/02/16 1850 03/02/16 2104 03/03/16 0758 03/03/16 1149 03/03/16 1616  GLUCAP 208* 186* 228* 251* 220*   Lipid Profile: No results for input(s): CHOL, HDL, LDLCALC, TRIG, CHOLHDL, LDLDIRECT in the last 72 hours. Thyroid Function Tests: No results for input(s): TSH, T4TOTAL, FREET4, T3FREE, THYROIDAB in the last 72 hours. Anemia Panel: No results for input(s): VITAMINB12, FOLATE, FERRITIN, TIBC, IRON, RETICCTPCT in the last 72 hours. Sepsis Labs:  Recent Labs Lab 02/28/16 1154 02/28/16 1508 02/29/16 0656 03/01/16 0613 03/03/16 0400  PROCALCITON 0.22  --   --  0.21 <0.10  LATICACIDVEN 3.7* 2.2* 0.9  --   --     Recent Results (from the past 240 hour(s))  Culture, expectorated sputum-assessment     Status: None   Collection Time: 02/28/16  4:22 AM  Result Value Ref Range Status   Specimen Description EXPECTORATED SPUTUM  Final   Special Requests NONE  Final   Sputum evaluation THIS SPECIMEN IS ACCEPTABLE FOR SPUTUM CULTURE  Final   Report Status 02/28/2016 FINAL  Final  Culture, respiratory (NON-Expectorated)     Status: None   Collection Time: 02/28/16  4:22 AM  Result Value Ref Range Status   Specimen Description EXPECTORATED SPUTUM  Final   Special Requests NONE Reflexed from [id number]  Final   Gram Stain   Final    MODERATE WBC PRESENT, PREDOMINANTLY PMN MODERATE GRAM POSITIVE COCCI FEW GRAM NEGATIVE RODS    Culture Consistent with normal respiratory flora.  Final    Report Status 03/01/2016 FINAL  Final  Culture, blood (x 2)     Status: None (Preliminary result)   Collection Time: 02/28/16 11:59 AM  Result Value Ref Range Status   Specimen Description BLOOD RIGHT ANTECUBITAL  Final   Special Requests BOTTLES DRAWN AEROBIC AND ANAEROBIC 5CC  Final   Culture NO GROWTH 4 DAYS  Final   Report Status PENDING  Incomplete  Culture, blood (x 2)     Status: None (Preliminary result)   Collection Time: 02/28/16 12:06 PM  Result Value Ref Range Status   Specimen Description BLOOD LEFT ANTECUBITAL  Final   Special Requests BOTTLES DRAWN AEROBIC AND ANAEROBIC 5CC  Final   Culture NO GROWTH 4 DAYS  Final   Report Status PENDING  Incomplete  MRSA PCR Screening     Status: None   Collection Time: 02/29/16  5:14 AM  Result Value Ref Range Status   MRSA by PCR NEGATIVE NEGATIVE Final    Comment:        The GeneXpert MRSA Assay (FDA approved for NASAL specimens only), is one component of a comprehensive MRSA colonization surveillance program. It is not intended to diagnose MRSA infection nor to guide or monitor treatment for MRSA infections.      Radiology Studies: No results found.  Scheduled Meds: . aspirin EC  81 mg Oral Daily  . aztreonam  1 g Intravenous Q8H  . doxycycline  100 mg Oral Q12H  . insulin aspart  0-9 Units Subcutaneous TID WC  . ipratropium-albuterol  3 mL Nebulization Q4H  . LORazepam  0.5 mg Intravenous Once  . magnesium chloride  1 tablet Oral Daily  . mouth rinse  15 mL Mouth Rinse BID  . methylPREDNISolone (SOLU-MEDROL) injection  60 mg Intravenous Q12H  . metoprolol tartrate  12.5 mg Oral BID  . pantoprazole  40 mg Oral Daily  . polyethylene glycol  34 g Oral Daily  . romiPLOStim  50 mcg Subcutaneous Weekly  . simvastatin  20 mg Oral QHS  . sodium chloride flush  3 mL Intravenous Q12H  . vancomycin  1,000 mg Intravenous Q24H   Continuous Infusions:   LOS: 4 days   Time spent: > 35 minutes  Velvet Bathe, MD [facility] Pager [phone]  If 7PM-7AM, please contact night-coverage [email or web address] Password [email or web address] 03/03/2016, 4:50 PM

## 2016-03-04 LAB — CULTURE, BLOOD (ROUTINE X 2)
CULTURE: NO GROWTH
CULTURE: NO GROWTH

## 2016-03-04 LAB — CBC
HCT: 31.3 % — ABNORMAL LOW (ref 39.0–52.0)
HEMOGLOBIN: 10.4 g/dL — AB (ref 13.0–17.0)
MCH: 31.7 pg (ref 26.0–34.0)
MCHC: 33.2 g/dL (ref 30.0–36.0)
MCV: 95.4 fL (ref 78.0–100.0)
Platelets: 170 10*3/uL (ref 150–400)
RBC: 3.28 MIL/uL — ABNORMAL LOW (ref 4.22–5.81)
RDW: 12.5 % (ref 11.5–15.5)
WBC: 13.7 10*3/uL — ABNORMAL HIGH (ref 4.0–10.5)

## 2016-03-04 LAB — BASIC METABOLIC PANEL
Anion gap: 11 (ref 5–15)
BUN: 40 mg/dL — ABNORMAL HIGH (ref 6–20)
CALCIUM: 9.3 mg/dL (ref 8.9–10.3)
CHLORIDE: 99 mmol/L — AB (ref 101–111)
CO2: 23 mmol/L (ref 22–32)
CREATININE: 1.56 mg/dL — AB (ref 0.61–1.24)
GFR calc Af Amer: 42 mL/min — ABNORMAL LOW (ref >60)
GFR calc non Af Amer: 36 mL/min — ABNORMAL LOW (ref >60)
Glucose, Bld: 259 mg/dL — ABNORMAL HIGH (ref 65–99)
Potassium: 4.5 mmol/L (ref 3.5–5.1)
SODIUM: 133 mmol/L — AB (ref 135–145)

## 2016-03-04 LAB — GLUCOSE, CAPILLARY
GLUCOSE-CAPILLARY: 248 mg/dL — AB (ref 65–99)
GLUCOSE-CAPILLARY: 304 mg/dL — AB (ref 65–99)
Glucose-Capillary: 296 mg/dL — ABNORMAL HIGH (ref 65–99)
Glucose-Capillary: 245 mg/dL — ABNORMAL HIGH (ref 65–99)

## 2016-03-04 NOTE — Progress Notes (Signed)
PROGRESS NOTE    Lawrence Warner  WUJ:[id number] DOB: [birth year] DOA: 02/28/2016 PCP: Hoyt Koch, MD    Brief Narrative:  83 y.o. male who presents with sepsis likely secondary to pulmonary etiology from flu and possible bacterial infection. Patient is critically ill and requiring very close monitoring. Appears to be improving slowly. Elevated troponin likely secondary to cardiac strain from sepsis   Assessment & Plan:   Principal Problem:   Acute respiratory failure with hypoxemia (HCC) - secondary to infectious etiology from flu and possibly a superimposed bacterial pna - improving will start to mobilize patient. PT order placed and encouraged patient to ambulate more    Sepsis ([place]): secondary to infectious etiology at this could be mixed viral and bacterial, source lung - continue current therapy - Blood cultures negative to date  Flu/PNA - continue current anti infectives, improving on this regimen - Will simplify antibiotic regimen and place on doxycycline.    Lactic acidosis - Resolved  Elevated troponin - no chest pain reported - most likely due to demand ischemia  Active Problems:   Diet-controlled diabetes mellitus (HCC) - SSI - carb modified diet    CAD (coronary artery disease) - stable no chest pain reported - continue aspirin, b blocker, and statin    Essential hypertension - on metroprolol    GERD (gastroesophageal reflux disease) - stable on PPI    HLD (hyperlipidemia) - Zocor, stable, no chest pain.    DVT prophylaxis: SCD's Code Status: DNR Family Communication: no family at bedside Disposition Plan: mobilize, may d/c in the next 1-2 days with continued improvement in condition.   Consultants:   None   Procedures: None   Antimicrobials: Tamiflu and Doxycycline   Subjective:  Pt has no new complaints.   Objective: Vitals:   03/04/16 0455 03/04/16 0900 03/04/16 0903 03/04/16 1159  BP: (!) 171/65 (!) 159/65      Pulse: 87 83 81 69  Resp: (!) 40 (!) 22 20 20   Temp: 97.8 F (36.6 C) 98 F (36.7 C)    TempSrc: Oral Oral    SpO2: 95% 95% 93% 93%  Weight:      Height:        Intake/Output Summary (Last 24 hours) at 03/04/16 1610 Last data filed at 03/04/16 1400  Gross per 24 hour  Intake             1060 ml  Output              465 ml  Net              595 ml   Filed Weights   03/02/16 0500 03/02/16 1616 03/02/16 2107  Weight: 99.5 kg (219 lb 5.7 oz) 93 kg (205 lb 0.4 oz) 93 kg (205 lb 0.4 oz)    Examination:  General exam: Appears calm and comfortable, in nad. Respiratory system: rhales and exp wheezes, speaking In broken sentences, equal chest rise Cardiovascular system: S1 & S2 heard, IRR. No JVD, murmurs, rubs, gallops or clicks. No pedal edema. Gastrointestinal system: Abdomen is nondistended, soft and nontender. No organomegaly or masses felt. Normal bowel sounds heard. obese Central nervous system: Alert and oriented. No facial asymmetry Extremities: Symmetric 5 x 5 power. Skin: No rashes, lesions or ulcers, on limited exam. Psychiatry:  Mood & affect appropriate.   Data Reviewed: I have personally reviewed following labs and imaging studies  CBC:  Recent Labs Lab 02/28/16 0429 02/28/16 0913 02/28/16 1154 02/29/16 0220 03/01/16 1041  03/04/16 0430  WBC 12.1* 12.9* 11.6* 8.5 6.9 13.7*  NEUTROABS 8.1*  --  9.2*  --   --   --   HGB 12.1* 10.7* 10.5* 11.0* 10.2* 10.4*  HCT 37.4* 33.6* 33.8* 33.6* 32.2* 31.3*  MCV 98.7 99.4 101.5* 99.4 98.8 95.4  PLT 86* 78* 74* 73* 83* 417   Basic Metabolic Panel:  Recent Labs Lab 02/28/16 0429 02/28/16 0913 02/29/16 0220 03/01/16 1041 03/02/16 0316 03/04/16 0430  NA 138  --  138 135 136 133*  K 3.4*  --  4.6 4.4 4.8 4.5  CL 104  --  107 104 102 99*  CO2 20*  --  24 24 27 23   GLUCOSE 214*  --  146* 200* 133* 259*  BUN 32*  --  29* 20 20 40*  CREATININE 2.05* 2.16* 1.77* 1.29* 1.38* 1.56*  CALCIUM 9.1  --  8.7* 8.9 9.0 9.3   MG  --  1.9  --   --   --   --   PHOS  --  3.5  --   --   --   --    GFR: Estimated Creatinine Clearance: 30.8 mL/min (by C-G formula based on SCr of 1.56 mg/dL (H)). Liver Function Tests:  Recent Labs Lab 02/29/16 0220  AST 49*  ALT 23  ALKPHOS 47  BILITOT 0.5  PROT 6.1*  ALBUMIN 2.9*   No results for input(s): LIPASE, AMYLASE in the last 168 hours. No results for input(s): AMMONIA in the last 168 hours. Coagulation Profile:  Recent Labs Lab 02/28/16 1154  INR 1.02   Cardiac Enzymes:  Recent Labs Lab 02/28/16 0429 02/28/16 0913 02/28/16 1508 02/28/16 2138  TROPONINI 0.11* 0.14* 0.40* 0.68*   BNP (last 3 results) No results for input(s): PROBNP in the last 8760 hours. HbA1C: No results for input(s): HGBA1C in the last 72 hours. CBG:  Recent Labs Lab 03/03/16 1149 03/03/16 1616 03/03/16 2131 03/04/16 0732 03/04/16 1206  GLUCAP 251* 220* 249* 248* 304*   Lipid Profile: No results for input(s): CHOL, HDL, LDLCALC, TRIG, CHOLHDL, LDLDIRECT in the last 72 hours. Thyroid Function Tests: No results for input(s): TSH, T4TOTAL, FREET4, T3FREE, THYROIDAB in the last 72 hours. Anemia Panel: No results for input(s): VITAMINB12, FOLATE, FERRITIN, TIBC, IRON, RETICCTPCT in the last 72 hours. Sepsis Labs:  Recent Labs Lab 02/28/16 1154 02/28/16 1508 02/29/16 0656 03/01/16 0613 03/03/16 0400  PROCALCITON 0.22  --   --  0.21 <0.10  LATICACIDVEN 3.7* 2.2* 0.9  --   --     Recent Results (from the past 240 hour(s))  Culture, expectorated sputum-assessment     Status: None   Collection Time: 02/28/16  4:22 AM  Result Value Ref Range Status   Specimen Description EXPECTORATED SPUTUM  Final   Special Requests NONE  Final   Sputum evaluation THIS SPECIMEN IS ACCEPTABLE FOR SPUTUM CULTURE  Final   Report Status 02/28/2016 FINAL  Final  Culture, respiratory (NON-Expectorated)     Status: None   Collection Time: 02/28/16  4:22 AM  Result Value Ref Range Status    Specimen Description EXPECTORATED SPUTUM  Final   Special Requests NONE Reflexed from [id number]  Final   Gram Stain   Final    MODERATE WBC PRESENT, PREDOMINANTLY PMN MODERATE GRAM POSITIVE COCCI FEW GRAM NEGATIVE RODS    Culture Consistent with normal respiratory flora.  Final   Report Status 03/01/2016 FINAL  Final  Culture, blood (x 2)     Status:  None   Collection Time: 02/28/16 11:59 AM  Result Value Ref Range Status   Specimen Description BLOOD RIGHT ANTECUBITAL  Final   Special Requests BOTTLES DRAWN AEROBIC AND ANAEROBIC 5CC  Final   Culture NO GROWTH 5 DAYS  Final   Report Status 03/04/2016 FINAL  Final  Culture, blood (x 2)     Status: None   Collection Time: 02/28/16 12:06 PM  Result Value Ref Range Status   Specimen Description BLOOD LEFT ANTECUBITAL  Final   Special Requests BOTTLES DRAWN AEROBIC AND ANAEROBIC 5CC  Final   Culture NO GROWTH 5 DAYS  Final   Report Status 03/04/2016 FINAL  Final  MRSA PCR Screening     Status: None   Collection Time: 02/29/16  5:14 AM  Result Value Ref Range Status   MRSA by PCR NEGATIVE NEGATIVE Final    Comment:        The GeneXpert MRSA Assay (FDA approved for NASAL specimens only), is one component of a comprehensive MRSA colonization surveillance program. It is not intended to diagnose MRSA infection nor to guide or monitor treatment for MRSA infections.      Radiology Studies: No results found.  Scheduled Meds: . aspirin EC  81 mg Oral Daily  . doxycycline  100 mg Oral Q12H  . insulin aspart  0-9 Units Subcutaneous TID WC  . ipratropium-albuterol  3 mL Nebulization Q4H  . LORazepam  0.5 mg Intravenous Once  . magnesium chloride  1 tablet Oral Daily  . mouth rinse  15 mL Mouth Rinse BID  . methylPREDNISolone (SOLU-MEDROL) injection  60 mg Intravenous Q12H  . metoprolol tartrate  12.5 mg Oral BID  . pantoprazole  40 mg Oral Daily  . polyethylene glycol  34 g Oral Daily  . romiPLOStim  50 mcg Subcutaneous Weekly   . simvastatin  20 mg Oral QHS  . sodium chloride flush  3 mL Intravenous Q12H   Continuous Infusions:   LOS: 5 days   Time spent: > 35 minutes  Velvet Bathe, MD [facility] Pager [phone]  If 7PM-7AM, please contact night-coverage [email or web address] Password [email or web address] 03/04/2016, 4:10 PM

## 2016-03-04 NOTE — Evaluation (Signed)
Physical Therapy Evaluation Patient Details Name: Lawrence Warner MRN: [id number] DOB: 1919-01-22 Today's Date: 03/04/2016   History of Present Illness  Pt is a 83 y.o. male admitted with acute respiratory failure with hypoxemia secondary to infectious etiology from flu and possibly superimposed bacterial pneumonia. PMH: HTN, diabetic neuropathy. diabetes, CAD, chronic renal insufficiency, CABG, Rt THA.   Clinical Impression  Pt admitted with above diagnosis. Pt currently with functional limitations due to the deficits listed below (see PT Problem List). Pt able to ambulate 20 ft X2 with rw during initial PT session. Pt is currently requiring min assist with bed mobility and transfers. Based upon the patient's current mobility, recommending short stay at SNF for further rehabilitation prior to returning home. PT will continue to follow to increase the patient's independence and safety with mobility to allow discharge to the venue listed below.       Follow Up Recommendations SNF;Supervision/Assistance - 24 hour    Equipment Recommendations  None recommended by PT    Recommendations for Other Services       Precautions / Restrictions Precautions Precautions: Fall Restrictions Weight Bearing Restrictions: No      Mobility  Bed Mobility Overal bed mobility: Needs Assistance Bed Mobility: Supine to Sit;Sit to Supine     Supine to sit: Min assist Sit to supine: Min assist   General bed mobility comments: HOB elevated with rail assist. manual assist provided at trunk.   Transfers Overall transfer level: Needs assistance   Transfers: Sit to/from Stand Sit to Stand: Min assist;From elevated surface         General transfer comment: cues for hand placement, repeated X2  Ambulation/Gait Ambulation/Gait assistance: Min guard Ambulation Distance (Feet): 20 Feet (X2) Assistive device: Rolling walker (2 wheeled) Gait Pattern/deviations: Step-through pattern;Decreased step  length - right;Decreased step length - left;Trunk flexed Gait velocity: decreased   General Gait Details: guard for safety, pt getting wheel caught X1 with assist to move.   Stairs            Wheelchair Mobility    Modified Rankin (Stroke Patients Only)       Balance Overall balance assessment: Needs assistance Sitting-balance support: No upper extremity supported Sitting balance-Leahy Scale: Good     Standing balance support: Bilateral upper extremity supported Standing balance-Leahy Scale: Poor Standing balance comment: using rw for support                             Pertinent Vitals/Pain Pain Assessment: No/denies pain    Home Living Family/patient expects to be discharged to:: Unsure Living Arrangements: Other (Comment) (independent living facility)   Type of Home: Independent living facility Home Access: Level entry;Elevator     Home Layout: One level Home Equipment: Walker - 4 wheels;Electric scooter;Wheelchair - manual;Cane - single point Additional Comments: states that he gets his meals provided but no other assistance    Prior Function Level of Independence: Independent with assistive device(s)         Comments: using SPC for ambulation     Hand Dominance        Extremity/Trunk Assessment   Upper Extremity Assessment Upper Extremity Assessment: Generalized weakness    Lower Extremity Assessment Lower Extremity Assessment: Generalized weakness    Cervical / Trunk Assessment Cervical / Trunk Assessment: Kyphotic  Communication   Communication: No difficulties  Cognition Arousal/Alertness: Awake/alert Behavior During Therapy: WFL for tasks assessed/performed Overall Cognitive Status: Within Functional Limits for tasks  assessed                      General Comments      Exercises     Assessment/Plan    PT Assessment Patient needs continued PT services  PT Problem List Decreased strength;Decreased activity  tolerance;Decreased range of motion;Decreased balance;Decreased mobility          PT Treatment Interventions DME instruction;Gait training;Functional mobility training;Therapeutic activities;Therapeutic exercise;Balance training;Patient/family education    PT Goals (Current goals can be found in the Care Plan section)  Acute Rehab PT Goals Patient Stated Goal: get back to being active and living in his home PT Goal Formulation: With patient Time For Goal Achievement: 03/18/16 Potential to Achieve Goals: Good    Frequency Min 3X/week   Barriers to discharge        Co-evaluation               End of Session Equipment Utilized During Treatment: Gait belt Activity Tolerance: Patient limited by fatigue Patient left: in bed;with call bell/phone within reach;with bed alarm set Nurse Communication: Mobility status         Time: 2233-6122 PT Time Calculation (min) (ACUTE ONLY): 35 min   Charges:   PT Evaluation $PT Eval Moderate Complexity: 1 Procedure PT Treatments $Gait Training: 8-22 mins   PT G Codes:        Cassell Clement, PT, CSCS Pager [phone] Office [phone]  03/04/2016, 4:26 PM

## 2016-03-04 NOTE — Progress Notes (Signed)
Inpatient Diabetes Program Recommendations  AACE/ADA: New Consensus Statement on Inpatient Glycemic Control (2015)  Target Ranges:  Prepandial:   less than 140 mg/dL      Peak postprandial:   less than 180 mg/dL (1-2 hours)      Critically ill patients:  140 - 180 mg/dL   Lab Results  Component Value Date   GLUCAP 304 (H) 03/04/2016   HGBA1C 7.0 (H) 05/23/2015    Review of Glycemic Control Results for Lawrence Warner, Lawrence Warner (MRN [id number]) as of 03/04/2016 12:47  Ref. Range 03/03/2016 11:49 03/03/2016 16:16 03/03/2016 21:31 03/04/2016 07:32 03/04/2016 12:06  Glucose-Capillary Latest Ref Range: 65 - 99 mg/dL 251 (H) 220 (H) 249 (H) 248 (H) 304 (H)   Diabetes history: DM2 Outpatient Diabetes medications: None Current orders for Inpatient glycemic control: Novolog correction 0-9 units tid  Inpatient Diabetes Program Recommendations:  While on steroids, please consider increase in Novolog correction to moderate 0-15 units tid + 0-5 units hs. Will follow.  Thank you, Nani Gasser. Koi Yarbro, RN, MSN, CDE Inpatient Glycemic Control Team Team Pager [phone] (8am-5pm) 03/04/2016 12:52 PM

## 2016-03-04 NOTE — Care Management Important Message (Signed)
Important Message  Patient Details  Name: Lawrence Warner MRN: [id number] Date of Birth: Feb 21, 1919   Medicare Important Message Given:  Yes    Dvaughn Fickle, Rory Percy, RN 03/04/2016, 12:27 PM

## 2016-03-05 LAB — GLUCOSE, CAPILLARY
GLUCOSE-CAPILLARY: 275 mg/dL — AB (ref 65–99)
GLUCOSE-CAPILLARY: 230 mg/dL — AB (ref 65–99)
Glucose-Capillary: 356 mg/dL — ABNORMAL HIGH (ref 65–99)
Glucose-Capillary: 222 mg/dL — ABNORMAL HIGH (ref 65–99)

## 2016-03-05 MED ORDER — IPRATROPIUM-ALBUTEROL 0.5-2.5 (3) MG/3ML IN SOLN
3.0000 mL | Freq: Four times a day (QID) | RESPIRATORY_TRACT | Status: DC
  Administered 2016-03-05 – 2016-03-06 (×5): 3 mL via RESPIRATORY_TRACT
  Filled 2016-03-05 (×6): qty 3

## 2016-03-05 MED ORDER — ASPIRIN 81 MG PO TBEC: 81.0000 mg | DELAYED_RELEASE_TABLET | Freq: Every day | ORAL | 0 refills | Status: DC

## 2016-03-05 MED ORDER — DOXYCYCLINE HYCLATE 100 MG PO TABS: 100.0000 mg | ORAL_TABLET | Freq: Two times a day (BID) | ORAL | 0 refills | Status: DC

## 2016-03-05 NOTE — Social Work (Signed)
CSW Intern talked with patient about discharge plan.  Patient declined skilled placement because he feels that he would receive the same care at [facility] where he is currently living at.  No other social work needs identified.  CSW Intern signing off.      Linward Headland, CSW Intern

## 2016-03-05 NOTE — Clinical Social Work Note (Signed)
Clinical Social Work Assessment  Patient Details  Name: Lawrence Warner MRN: [id number] Date of Birth: 21-Dec-1918  Date of referral:  03/05/16               Reason for consult:  Facility Placement                Permission sought to share information with:  Family Supports Permission granted to share information::  Yes, Verbal Permission Granted  Name::     Kazden Largo  Agency::     Relationship::  Son  Contact Information:  [phone]  Housing/Transportation Living arrangements for the past 2 months:  [facility]) Source of Information:  Patient Patient Interpreter Needed:  None Criminal Activity/Legal Involvement Pertinent to Current Situation/Hospitalization:  No - Comment as needed Significant Relationships:  Adult Children Lives with:  Self Do you feel safe going back to the place where you live?  Yes Need for family participation in patient care:  Yes (Comment)  Care giving concerns:  Patient pleasantly declined skilled nursing placement   Social Worker assessment / plan:  CSW Intern spoke with patient about discharge plans, explaining that a short term rehab was recommended after discharge.  Mr. Gough stated that he did not feel that rehab would be needed at this time because he is getting the same care and exercises at the assisted living facility he is living at. Per patient, his son Juanda Crumble and daughter-in-law lives fifteen minutes away and he sees them 2-3 times per week.  Mr. Meno said that he has dinner with his son and daughter-in-law every Tuesday's and he goes to the [facility] every Thursday evening for dinner and music.  Patient stated that he exercises most mornings after he gets out of bed.  Mr. Seipp said that he felt that going back [place] would be best for his care, since they already have a therapist lined up for him.  No other social work needs addressed and patient thanked social work for services  provided.    Employment status:  Retired Forensic scientist:  Medicare PT Recommendations:    Information / Referral to community resources:  [place]  Patient/Family's Response to care:  Patient stated that he was appreciative of the care that he received in the hospital.  Patient/Family's Understanding of and Emotional Response to Diagnosis, Current Treatment, and Prognosis:  Not Discussed  Emotional Assessment Appearance:  Appears younger than stated age Attitude/Demeanor/Rapport:  Other (appropriate) Affect (typically observed):  Hopeful, Appropriate Orientation:  Oriented to Self, Oriented to Place, Oriented to  Time, Oriented to Situation Alcohol / Substance use:  Tobacco Use (Former smoker) Psych involvement (Current and /or in the community):  No (Comment)  Discharge Needs  Concerns to be addressed:  Discharge Planning Concerns Readmission within the last 30 days:  No Current discharge risk:  None Barriers to Discharge:  No Barriers Identified   Linward Headland, [place] Work 03/05/2016, 11:44 AM

## 2016-03-05 NOTE — NC FL2 (Signed)
[place] MEDICAID FL2 LEVEL OF CARE SCREENING TOOL     IDENTIFICATION  Patient Name: Lawrence Warner Birthdate: [birth year] Sex: male Admission Date (Current Location): 02/28/2016  [facility] and [place] Number:  Herbalist and Address:  [facility], [place] [address] [place], [place] [address]      Provider Number: [id number]  Attending Physician Name and Address:  Velvet Bathe, MD  Relative Name and Phone Number:   Cayman Kielbasa (son) - [phone]    Current Level of Care: Hospital Recommended Level of Care: [place] Prior Approval Number:    Date Approved/Denied:   PASRR Number: [id number]  Discharge Plan: SNF    Current Diagnoses: Patient Active Problem List   Diagnosis Date Noted  . Respiratory failure ([place]) 02/28/2016  . Acute respiratory failure with hypoxemia ([place]) 02/28/2016  . HLD (hyperlipidemia) 02/28/2016  . AKI (acute kidney injury) ([place])   . Influenza   . Sepsis ([place])   . Constipation 12/07/2015  . Bradycardia 11/28/2015  . Port catheter in place 10/24/2015  . GERD (gastroesophageal reflux disease) 10/09/2015  . Gait instability 10/09/2015  . Chest pain 10/05/2015  . Diet-controlled diabetes mellitus ([place]) 05/07/2015  . CAD (coronary artery disease) 05/07/2015  . Essential hypertension 05/07/2015  . Muscle cramps 04/25/2015  . Shortness of breath 04/11/2015  . Idiopathic thrombocytopenic purpura ([place]) 09/27/2014    Orientation RESPIRATION BLADDER Height & Weight     Self, Time, Situation, Place  Normal Incontinent Weight: 204 lb 12.8 oz (92.9 kg) Height:  5\' 7"  (170.2 cm)  BEHAVIORAL SYMPTOMS/MOOD NEUROLOGICAL BOWEL NUTRITION STATUS      Continent Diet (heart healthy/carb modified/thin fluid)  AMBULATORY STATUS COMMUNICATION OF NEEDS Skin   Limited Assist Verbally Other (Comment) (Ecchymosis left/right arms)                       Personal Care Assistance Level of Assistance   Feeding, Bathing, Dressing Bathing Assistance: Limited assistance Feeding assistance: Independent Dressing Assistance: Limited assistance     Functional Limitations Info  Sight, Hearing, Speech Sight Info: Adequate Hearing Info: Adequate Speech Info: Adequate    SPECIAL CARE FACTORS FREQUENCY  PT (By licensed PT)     PT Frequency: PT Eval completed on 03/04/2016 with recommendation of 3x/week minimum              Contractures Contractures Info: Present    Additional Factors Info  Code Status, Allergies Code Status Info: DNR Allergies Info: Alfuzosin Hcl Er, Penicillins           Current Medications (03/05/2016):  This is the current hospital active medication list Current Facility-Administered Medications  Medication Dose Route Frequency Provider Last Rate Last Dose  . acetaminophen (TYLENOL) tablet 650 mg  650 mg Oral Q4H PRN Velvet Bathe, MD      . albuterol (PROVENTIL) (2.5 MG/3ML) 0.083% nebulizer solution 2.5 mg  2.5 mg Nebulization Q2H PRN Brenton Grills, PA-C   2.5 mg at 02/29/16 0134  . aspirin EC tablet 81 mg  81 mg Oral Daily Brenton Grills, PA-C   81 mg at 03/05/16 0949  . doxycycline (VIBRA-TABS) tablet 100 mg  100 mg Oral Q12H Einar Grad, RPH   100 mg at 03/05/16 6314  . insulin aspart (novoLOG) injection 0-9 Units  0-9 Units Subcutaneous TID WC Brenton Grills, PA-C   5 Units at 03/05/16 0827  . ipratropium-albuterol (DUONEB) 0.5-2.5 (3) MG/3ML nebulizer solution 3 mL  3 mL Nebulization Q6H Velvet Bathe, MD   3 mL at 03/05/16 0214  . LORazepam (ATIVAN) injection 0.5 mg  0.5 mg Intravenous Once Waldemar Dickens, MD   Stopped at 02/28/16 1133  . magnesium chloride (SLOW-MAG) 64 MG SR tablet 64 mg  1 tablet Oral Daily Brenton Grills, PA-C   64 mg at 03/05/16 0949  . MEDLINE mouth rinse  15 mL Mouth Rinse BID Velvet Bathe, MD   15 mL at 03/05/16 0950  . methylPREDNISolone sodium succinate (SOLU-MEDROL) 125 mg/2 mL injection 60 mg  60 mg  Intravenous Q12H Velvet Bathe, MD   60 mg at 03/05/16 0109  . metoprolol tartrate (LOPRESSOR) tablet 12.5 mg  12.5 mg Oral BID Brenton Grills, PA-C   12.5 mg at 03/05/16 0949  . nitroGLYCERIN (NITROSTAT) SL tablet 0.4 mg  0.4 mg Sublingual Q5 min PRN Brenton Grills, PA-C      . ondansetron [facility]) tablet 4 mg  4 mg Oral Q6H PRN Brenton Grills, PA-C       Or  . ondansetron [facility]) injection 4 mg  4 mg Intravenous Q6H PRN Brenton Grills, PA-C      . pantoprazole (PROTONIX) EC tablet 40 mg  40 mg Oral Daily Brenton Grills, PA-C   40 mg at 03/05/16 0949  . polyethylene glycol (MIRALAX / GLYCOLAX) packet 34 g  34 g Oral Daily Rise Patience, MD   34 g at 03/05/16 0948  . romiPLOStim (NPLATE) injection 50 mcg  50 mcg Subcutaneous Weekly Waldemar Dickens, MD   50 mcg at 02/28/16 1421  . simvastatin (ZOCOR) tablet 20 mg  20 mg Oral QHS Brenton Grills, PA-C   20 mg at 03/04/16 2238  . sodium chloride flush (NS) 0.9 % injection 3 mL  3 mL Intravenous Q12H Brenton Grills, PA-C   3 mL at 03/05/16 0950  . traZODone (DESYREL) tablet 25 mg  25 mg Oral QHS PRN Brenton Grills, PA-C         Discharge Medications: Please see discharge summary for a list of discharge medications.  Relevant Imaging Results:  Relevant Lab Results:   Additional Information SSN:  [id number]  Lajoyce Lauber Work [phone]

## 2016-03-05 NOTE — Care Management Note (Signed)
Case Management Note  Patient Details  Name: Lawrence Warner MRN: [id number] Date of Birth: March 13, 1918  Subjective/Objective:    CM following for progression and d/c planning.                 Action/Plan: 03/05/2016 Pt insisting upon returning to independent living at [facility]. This CM and social work Theatre manager met with pt , daughter in Sports coach and pt son re risk of pt trying to live independently given severity of recent illness and weakness. PT and OT both recommending short term SNF. This CM discuss concerns re fall at home and need to return to hospital. Pt has agreed to short term rehab. CSW working on placement for this pt, plan to d/c on Wed, 03/07/2015.  Expected Discharge Date:     03/06/2016             Expected Discharge Plan:  Skilled Nursing Facility  In-House Referral:  Clinical Social Work  Discharge planning Services     Post Acute Care Choice:    Choice offered to:     DME Arranged:  N/A DME Agency:  NA  HH Arranged:    [place] Agency:     Status of Service:  Completed, signed off  If discussed at [employer], dates discussed:    Additional Comments:  Adron Bene, RN 03/05/2016, 5:06 PM

## 2016-03-05 NOTE — Progress Notes (Signed)
Spoke with patient and family.  Patient is agreeable to go to SNF for short term rehab.  Family chose bed at [facility] and will be transported 03/06/2016.

## 2016-03-05 NOTE — Progress Notes (Signed)
Inpatient Diabetes Program Recommendations  AACE/ADA: New Consensus Statement on Inpatient Glycemic Control (2015)  Target Ranges:  Prepandial:   less than 140 mg/dL      Peak postprandial:   less than 180 mg/dL (1-2 hours)      Critically ill patients:  140 - 180 mg/dL   Lab Results  Component Value Date   GLUCAP 356 (H) 03/05/2016   HGBA1C 7.0 (H) 05/23/2015    Review of Glycemic Control  Diabetes history: DM2 Outpatient Diabetes medications: None Current orders for Inpatient glycemic control: Novolog correction 0-9 units tid  Inpatient Diabetes Program Recommendations:   While on steroids, please consider: -Increase in Novolog correction to moderate 0-15 units tid + 0-5 units hs -Lantus 0.1 x 92.9 kg =9 units daily  Thank you, Bethena Roys E. Bradley Bostelman, RN, MSN, CDE Inpatient Glycemic Control Team Team Pager [phone] (8am-5pm) 03/05/2016 1:06 PM

## 2016-03-05 NOTE — Discharge Summary (Signed)
Physician Discharge Summary  Per Beagley [name] DOB: 16-Sep-1918 DOA: 02/28/2016  PCP: Hoyt Koch, MD  Admit date: 02/28/2016 Discharge date: 03/05/2016  Time spent: > 35  minutes  Recommendations for Outpatient Follow-up:  1. Monitor serum creatinine 2. Prolonged antibiotics 3 more days to complete a 10 day treatment course 3. Pt was on steroids but will be discontinued on d/c was on 4 days of steroids   Discharge Diagnoses:  Principal Problem:   Acute respiratory failure with hypoxemia (HCC) Active Problems:   Diet-controlled diabetes mellitus (HCC)   CAD (coronary artery disease)   Essential hypertension   Chest pain   GERD (gastroesophageal reflux disease)   HLD (hyperlipidemia)   Sepsis ([place])   Discharge Condition: stable  Diet recommendation: heart healthy/carb modified diet  Filed Weights   03/02/16 1616 03/02/16 2107 03/05/16 0459  Weight: 93 kg (205 lb 0.4 oz) 93 kg (205 lb 0.4 oz) 92.9 kg (204 lb 12.8 oz)    History of present illness:  83 Lawrence Warner presents with sepsis likely secondary to pulmonary etiology from flu and possible bacterial infection. Patient is critically ill and requiring very close monitoring. Appears to be improving slowly. Elevated troponin likely secondary to cardiac strain from sepsis.  Hospital Course:  Principal Problem:   Acute respiratory failure with hypoxemia (HCC) - secondary to infectious etiology from flu and possibly a superimposed bacterial pna - Pt to get PT at SNF    Sepsis [facility]): secondary to infectious etiology at this could be mixed viral and bacterial, source lung - resolved - Blood cultures negative to date  Flu/PNA - improving on doxycycline will continue for 3 more days to complete a 10 day treatment course.   Lactic acidosis - Resolved  Elevated troponin - no chest pain reported - most likely due to demand ischemia  Active Problems:   Diet-controlled diabetes mellitus ([place]) -  d/c off steroids    CAD (coronary artery disease) - stable no chest pain reported - continue aspirin, b blocker, and statin    Essential hypertension - on metroprolol    GERD (gastroesophageal reflux disease) - stable on PPI    HLD (hyperlipidemia) - Zocor, stable, no chest pain.  Procedures:  None  Consultations:  none  Discharge Exam: Vitals:   03/05/16 0500 03/05/16 0900  BP: (!) 155/70 (!) 145/72  Pulse: 62 65  Resp: 20 20  Temp: 98.2 F (36.8 C) 98.6 F (37 C)    General: Pt in nad, alert and awake Cardiovascular: rrr, no rubs Respiratory: no increased wob, no wheezes  Discharge Instructions   Discharge Instructions    Call MD for:  difficulty breathing, headache or visual disturbances    Complete by:  As directed    Call MD for:  temperature >100.4    Complete by:  As directed    Diet - low sodium heart healthy    Complete by:  As directed    Discharge instructions    Complete by:  As directed    Pt will need physical therapy at facility   Increase activity slowly    Complete by:  As directed      Current Discharge Medication List    START taking these medications   Details  aspirin EC 81 MG EC tablet Take 1 tablet (81 mg total) by mouth daily. Qty: 30 tablet, Refills: 0    doxycycline (VIBRA-TABS) 100 MG tablet Take 1 tablet (100 mg total) by mouth every 12 (twelve) hours. Qty: 6 tablet,  Refills: 0      CONTINUE these medications which have NOT CHANGED   Details  acetaminophen (TYLENOL) 500 MG tablet Take 250-1,000 mg by mouth every 6 (six) hours as needed for mild pain, moderate pain or headache.     Ascorbic Acid (VITAMIN C) 100 MG tablet Take 100 mg by mouth daily.     cholecalciferol (VITAMIN D) 1000 units tablet Take 1,000 Units by mouth daily.    magnesium chloride (SLOW-MAG) 64 MG TBEC SR tablet Take 1 tablet (64 mg total) by mouth daily. Qty: 60 tablet, Refills: 1    metoprolol tartrate (LOPRESSOR) 25 MG tablet Take 0.5  tablets (12.5 mg total) by mouth 2 (two) times daily. Qty: 90 tablet, Refills: 0    omeprazole (PRILOSEC) 20 MG capsule Take 20 mg by mouth daily.    simvastatin (ZOCOR) 20 MG tablet Take 20 mg by mouth at bedtime.     nitroGLYCERIN (NITROSTAT) 0.4 MG SL tablet Place 1 tablet (0.4 mg total) under the tongue every 5 (five) minutes as needed for chest pain. Qty: 25 tablet, Refills: 3      STOP taking these medications     azithromycin (ZITHROMAX Z-PAK) 250 MG tablet      losartan (COZAAR) 100 MG tablet      oseltamivir (TAMIFLU) 75 MG capsule      polyethylene glycol powder (GLYCOLAX/MIRALAX) powder        Allergies  Allergen Reactions  . Alfuzosin Hcl Er     confusion  . Penicillins Hives and Other (See Comments)    Has patient had a PCN reaction causing immediate rash, facial/tongue/throat swelling, SOB or lightheadedness with hypotension: No Has patient had a PCN reaction causing severe rash involving mucus membranes or skin necrosis: No Has patient had a PCN reaction that required hospitalization No Has patient had a PCN reaction occurring within the last 10 years: No If all of the above answers are "NO", then may proceed with Cephalosporin use.      The results of significant diagnostics from this hospitalization (including imaging, microbiology, ancillary and laboratory) are listed below for reference.    Significant Diagnostic Studies: Dg Chest 2 View  Result Date: 02/25/2016 CLINICAL DATA:  Shortness of breath, productive cough. EXAM: CHEST  2 VIEW COMPARISON:  10/05/2015 FINDINGS: Prior CABG. Minimal bibasilar atelectasis. Heart is normal size. No effusions or acute bony abnormality. IMPRESSION: Minimal bibasilar atelectasis.  No active disease. Electronically Signed   By: Rolm Baptise M.D.   On: 02/25/2016 12:22   Dg Chest Port 1 View  Result Date: 02/28/2016 CLINICAL DATA:  Chest pain.  Shortness of breath. EXAM: PORTABLE CHEST 1 VIEW COMPARISON:  02/28/2016  02/25/2016.  10/05/2015.  04/11/2015. FINDINGS: Prior median sternotomy and CABG. Cardiomegaly. Mild pulmonary venous congestion. Low lung volumes. Chronic interstitial changes are present. Stable left base pleural thickening noted consistent with scarring. No pneumothorax. IMPRESSION: 1. Prior median sternotomy. Cardiomegaly with mild pulmonary venous congestion. 2. Lung volumes with chronic interstitial lung disease. Left base pleural scarring. Electronically Signed   By: Marcello Moores  Register   On: 02/28/2016 07:30   Dg Chest Port 1 View  Result Date: 02/28/2016 CLINICAL DATA:  Cough, shortness of breath 9. Recent flu. History of diabetes, hypertension. EXAM: PORTABLE CHEST 1 VIEW COMPARISON:  Chest radiograph February 25, 2016 FINDINGS: Cardiomediastinal silhouette is normal, status post median sternotomy for CABG. Pulmonary vascular congestion. No pleural effusion or focal consolidation. Bibasilar strandy densities. No pneumothorax. Soft tissue planes included osseous structures are  nonacute. RIGHT shoulder intra-articular calcifications can be seen with CPPD. IMPRESSION: Pulmonary vascular congestion and bibasilar atelectasis. Electronically Signed   By: Elon Alas M.D.   On: 02/28/2016 04:31    Microbiology: Recent Results (from the past 240 hour(s))  Culture, expectorated sputum-assessment     Status: None   Collection Time: 02/28/16  4:22 AM  Result Value Ref Range Status   Specimen Description EXPECTORATED SPUTUM  Final   Special Requests NONE  Final   Sputum evaluation THIS SPECIMEN IS ACCEPTABLE FOR SPUTUM CULTURE  Final   Report Status 02/28/2016 FINAL  Final  Culture, respiratory (NON-Expectorated)     Status: None   Collection Time: 02/28/16  4:22 AM  Result Value Ref Range Status   Specimen Description EXPECTORATED SPUTUM  Final   Special Requests NONE Reflexed from [id number]  Final   Gram Stain   Final    MODERATE WBC PRESENT, PREDOMINANTLY PMN MODERATE GRAM POSITIVE COCCI FEW  GRAM NEGATIVE RODS    Culture Consistent with normal respiratory flora.  Final   Report Status 03/01/2016 FINAL  Final  Culture, blood (x 2)     Status: None   Collection Time: 02/28/16 11:59 AM  Result Value Ref Range Status   Specimen Description BLOOD RIGHT ANTECUBITAL  Final   Special Requests BOTTLES DRAWN AEROBIC AND ANAEROBIC 5CC  Final   Culture NO GROWTH 5 DAYS  Final   Report Status 03/04/2016 FINAL  Final  Culture, blood (x 2)     Status: None   Collection Time: 02/28/16 12:06 PM  Result Value Ref Range Status   Specimen Description BLOOD LEFT ANTECUBITAL  Final   Special Requests BOTTLES DRAWN AEROBIC AND ANAEROBIC 5CC  Final   Culture NO GROWTH 5 DAYS  Final   Report Status 03/04/2016 FINAL  Final  MRSA PCR Screening     Status: None   Collection Time: 02/29/16  5:14 AM  Result Value Ref Range Status   MRSA by PCR NEGATIVE NEGATIVE Final    Comment:        The GeneXpert MRSA Assay (FDA approved for NASAL specimens only), is one component of a comprehensive MRSA colonization surveillance program. It is not intended to diagnose MRSA infection nor to guide or monitor treatment for MRSA infections.      Labs: Basic Metabolic Panel:  Recent Labs Lab 02/28/16 0429 02/28/16 0913 02/29/16 0220 03/01/16 1041 03/02/16 0316 03/04/16 0430  NA 138  --  138 135 136 133*  K 3.4*  --  4.6 4.4 4.8 4.5  CL 104  --  107 104 102 99*  CO2 20*  --  24 24 27 23   GLUCOSE 214*  --  146* 200* 133* 259*  BUN 32*  --  29* 20 20 40*  CREATININE 2.05* 2.16* 1.77* 1.29* 1.38* 1.56*  CALCIUM 9.1  --  8.7* 8.9 9.0 9.3  MG  --  1.9  --   --   --   --   PHOS  --  3.5  --   --   --   --    Liver Function Tests:  Recent Labs Lab 02/29/16 0220  AST 49*  ALT 23  ALKPHOS 47  BILITOT 0.5  PROT 6.1*  ALBUMIN 2.9*   No results for input(s): LIPASE, AMYLASE in the last 168 hours. No results for input(s): AMMONIA in the last 168 hours. CBC:  Recent Labs Lab 02/28/16 0429  02/28/16 0913 02/28/16 1154 02/29/16 0220 03/01/16 1041 03/04/16 0430  WBC 12.1* 12.9* 11.6* 8.5 6.9 13.7*  NEUTROABS 8.1*  --  9.2*  --   --   --   HGB 12.1* 10.7* 10.5* 11.0* 10.2* 10.4*  HCT 37.4* 33.6* 33.8* 33.6* 32.2* 31.3*  MCV 98.7 99.4 101.5* 99.4 98.8 Lawrence.4  PLT 86* 78* 74* 73* 83* 170   Cardiac Enzymes:  Recent Labs Lab 02/28/16 0429 02/28/16 0913 02/28/16 1508 02/28/16 2138  TROPONINI 0.11* 0.14* 0.40* 0.68*   BNP: BNP (last 3 results)  Recent Labs  10/05/15 1130 02/28/16 0429 02/28/16 0914  BNP 278.4* 248.7* 259.0*    ProBNP (last 3 results) No results for input(s): PROBNP in the last 8760 hours.  CBG:  Recent Labs Lab 03/04/16 1206 03/04/16 1713 03/04/16 2036 03/05/16 0801 03/05/16 1226  GLUCAP 304* 296* 245* 275* 356*    Signed:  Velvet Bathe MD.  [facility] 03/05/2016, 2:37 PM

## 2016-03-06 ENCOUNTER — Encounter: Payer: Self-pay | Admitting: Geriatric Medicine

## 2016-03-06 DIAGNOSIS — R262 Difficulty in walking, not elsewhere classified: Secondary | ICD-10-CM | POA: Diagnosis not present

## 2016-03-06 DIAGNOSIS — R5381 Other malaise: Secondary | ICD-10-CM | POA: Diagnosis not present

## 2016-03-06 DIAGNOSIS — J111 Influenza due to unidentified influenza virus with other respiratory manifestations: Secondary | ICD-10-CM | POA: Diagnosis not present

## 2016-03-06 DIAGNOSIS — I10 Essential (primary) hypertension: Secondary | ICD-10-CM | POA: Diagnosis not present

## 2016-03-06 DIAGNOSIS — N179 Acute kidney failure, unspecified: Secondary | ICD-10-CM | POA: Diagnosis not present

## 2016-03-06 DIAGNOSIS — E569 Vitamin deficiency, unspecified: Secondary | ICD-10-CM | POA: Diagnosis not present

## 2016-03-06 DIAGNOSIS — J9601 Acute respiratory failure with hypoxia: Secondary | ICD-10-CM | POA: Diagnosis not present

## 2016-03-06 DIAGNOSIS — R2689 Other abnormalities of gait and mobility: Secondary | ICD-10-CM | POA: Diagnosis not present

## 2016-03-06 DIAGNOSIS — J181 Lobar pneumonia, unspecified organism: Secondary | ICD-10-CM | POA: Diagnosis not present

## 2016-03-06 DIAGNOSIS — R278 Other lack of coordination: Secondary | ICD-10-CM | POA: Diagnosis not present

## 2016-03-06 DIAGNOSIS — G8929 Other chronic pain: Secondary | ICD-10-CM | POA: Diagnosis not present

## 2016-03-06 DIAGNOSIS — M6281 Muscle weakness (generalized): Secondary | ICD-10-CM | POA: Diagnosis not present

## 2016-03-06 DIAGNOSIS — I129 Hypertensive chronic kidney disease with stage 1 through stage 4 chronic kidney disease, or unspecified chronic kidney disease: Secondary | ICD-10-CM | POA: Diagnosis not present

## 2016-03-06 DIAGNOSIS — I2510 Atherosclerotic heart disease of native coronary artery without angina pectoris: Secondary | ICD-10-CM | POA: Diagnosis not present

## 2016-03-06 DIAGNOSIS — N183 Chronic kidney disease, stage 3 (moderate): Secondary | ICD-10-CM | POA: Diagnosis not present

## 2016-03-06 DIAGNOSIS — E785 Hyperlipidemia, unspecified: Secondary | ICD-10-CM | POA: Diagnosis not present

## 2016-03-06 DIAGNOSIS — E1121 Type 2 diabetes mellitus with diabetic nephropathy: Secondary | ICD-10-CM | POA: Diagnosis not present

## 2016-03-06 DIAGNOSIS — Z111 Encounter for screening for respiratory tuberculosis: Secondary | ICD-10-CM | POA: Diagnosis not present

## 2016-03-06 DIAGNOSIS — D693 Immune thrombocytopenic purpura: Secondary | ICD-10-CM | POA: Diagnosis not present

## 2016-03-06 DIAGNOSIS — R269 Unspecified abnormalities of gait and mobility: Secondary | ICD-10-CM | POA: Diagnosis not present

## 2016-03-06 DIAGNOSIS — K219 Gastro-esophageal reflux disease without esophagitis: Secondary | ICD-10-CM | POA: Diagnosis not present

## 2016-03-06 DIAGNOSIS — K5900 Constipation, unspecified: Secondary | ICD-10-CM | POA: Diagnosis not present

## 2016-03-06 DIAGNOSIS — R2681 Unsteadiness on feet: Secondary | ICD-10-CM | POA: Diagnosis not present

## 2016-03-06 DIAGNOSIS — E119 Type 2 diabetes mellitus without complications: Secondary | ICD-10-CM | POA: Diagnosis not present

## 2016-03-06 DIAGNOSIS — A419 Sepsis, unspecified organism: Secondary | ICD-10-CM | POA: Diagnosis not present

## 2016-03-06 LAB — GLUCOSE, CAPILLARY
GLUCOSE-CAPILLARY: 135 mg/dL — AB (ref 65–99)
GLUCOSE-CAPILLARY: 188 mg/dL — AB (ref 65–99)

## 2016-03-06 MED ORDER — DOXYCYCLINE HYCLATE 100 MG PO TABS: 100.0000 mg | ORAL_TABLET | Freq: Two times a day (BID) | ORAL | 0 refills | Status: AC

## 2016-03-06 NOTE — Progress Notes (Signed)
Pt left floor via wheelchair accompanied by staff and family. 

## 2016-03-06 NOTE — Progress Notes (Signed)
Patient is being discharged to SNF today, please see d/c summary done by Dr Wendee Beavers.  I have updated patient and her son who is in the room.

## 2016-03-06 NOTE — Progress Notes (Signed)
Called report to Nursing supervisor, Vicente Males at [employer].

## 2016-03-06 NOTE — Clinical Social Work Note (Signed)
CSW reviewed SW intern Pura Spice Williamson's progress notes dated 1/9 at 12:09 pm and 4:25 pm and have approved as written.  Voncile Schwarz Givens, MSW, LCSW Licensed Clinical Social Worker [place] [phone]

## 2016-03-06 NOTE — Progress Notes (Signed)
Attempted to call report to [employer].

## 2016-03-06 NOTE — Clinical Social Work Placement (Signed)
   CLINICAL SOCIAL WORK PLACEMENT  NOTE 03/06/16 - DISCHARGED TO [facility] SKILLED FACILITY BY FAMILY   Date:  03/06/2016  Patient Details  Name: Lawrence Warner MRN: [id number] Date of Birth: August 26, 1918  Clinical Social Work is seeking post-discharge placement for this patient at the [place] level of care (*CSW will initial, date and re-position this form in  chart as items are completed):  Yes   Patient/family provided with [place] Work Department's list of facilities offering this level of care within the geographic area requested by the patient (or if unable, by the patient's family).  Yes   Patient/family informed of their freedom to choose among providers that offer the needed level of care, that participate in Medicare, Medicaid or managed care program needed by the patient, have an available bed and are willing to accept the patient.  Yes   Patient/family informed of [facility] [place]'s ownership interest in [facility] and [facility], as well as of the fact that they are under no obligation to receive care at these facilities.  PASRR submitted to EDS on 03/05/16     PASRR number received on 03/05/16     Existing PASRR number confirmed on       FL2 transmitted to all facilities in geographic area requested by pt/family on 03/05/16     FL2 transmitted to all facilities within larger geographic area on       Patient informed that his/her managed care company has contracts with or will negotiate with certain facilities, including the following:        Yes   Patient/family informed of bed offers received.  Patient chooses bed at [facility]     Physician recommends and patient chooses bed at      Patient to be transferred to [facility] on 03/06/16.  Patient to be transferred to facility by Family     Patient family notified on 03/06/16 of transfer.  Name of family member notified:  Family members at the bedside     PHYSICIAN        Additional Comment:    _______________________________________________ Sable Feil, LCSW 03/06/2016, 4:36 PM

## 2016-03-06 NOTE — Progress Notes (Signed)
Attempted to call report 2nd attempt, voice mail.

## 2016-03-07 ENCOUNTER — Telehealth: Payer: Self-pay | Admitting: *Deleted

## 2016-03-07 DIAGNOSIS — I129 Hypertensive chronic kidney disease with stage 1 through stage 4 chronic kidney disease, or unspecified chronic kidney disease: Secondary | ICD-10-CM | POA: Diagnosis not present

## 2016-03-07 DIAGNOSIS — N183 Chronic kidney disease, stage 3 (moderate): Secondary | ICD-10-CM | POA: Diagnosis not present

## 2016-03-07 DIAGNOSIS — J181 Lobar pneumonia, unspecified organism: Secondary | ICD-10-CM | POA: Diagnosis not present

## 2016-03-07 DIAGNOSIS — R5381 Other malaise: Secondary | ICD-10-CM | POA: Diagnosis not present

## 2016-03-07 DIAGNOSIS — E1121 Type 2 diabetes mellitus with diabetic nephropathy: Secondary | ICD-10-CM | POA: Diagnosis not present

## 2016-03-07 NOTE — Telephone Encounter (Signed)
1. Pt was on TCM list admitted for  Acute respiratory failure with hypoxemia. Pt was on steroids but will be discontinued 03/05/09 on 4 days of steroids, and sent to SNF...Lawrence Warner

## 2016-03-08 ENCOUNTER — Telehealth: Payer: Self-pay | Admitting: Hematology

## 2016-03-08 NOTE — Telephone Encounter (Signed)
Pt dtr called to sch next inj appt and missed lab/ov due to being in hospital. Gave pt next available appt 1/17 apt 330 pm

## 2016-03-13 ENCOUNTER — Ambulatory Visit: Payer: Medicare Other

## 2016-03-13 ENCOUNTER — Telehealth: Payer: Self-pay | Admitting: Hematology

## 2016-03-13 ENCOUNTER — Other Ambulatory Visit (HOSPITAL_BASED_OUTPATIENT_CLINIC_OR_DEPARTMENT_OTHER): Payer: Medicare Other

## 2016-03-13 ENCOUNTER — Ambulatory Visit (HOSPITAL_BASED_OUTPATIENT_CLINIC_OR_DEPARTMENT_OTHER): Payer: Medicare Other | Admitting: Hematology

## 2016-03-13 VITALS — BP 161/41 | HR 56 | Temp 97.8°F | Resp 20 | Wt 214.7 lb

## 2016-03-13 DIAGNOSIS — D693 Immune thrombocytopenic purpura: Secondary | ICD-10-CM

## 2016-03-13 DIAGNOSIS — Z95828 Presence of other vascular implants and grafts: Secondary | ICD-10-CM

## 2016-03-13 LAB — CBC & DIFF AND RETIC
BASO%: 0 % (ref 0.0–2.0)
BASOS ABS: 0 10*3/uL (ref 0.0–0.1)
EOS%: 0.8 % (ref 0.0–7.0)
Eosinophils Absolute: 0.1 10*3/uL (ref 0.0–0.5)
HEMATOCRIT: 34.2 % — AB (ref 38.4–49.9)
HGB: 11.2 g/dL — ABNORMAL LOW (ref 13.0–17.1)
Immature Retic Fract: 7.8 % (ref 3.00–10.60)
LYMPH%: 19.3 % (ref 14.0–49.0)
MCH: 32.1 pg (ref 27.2–33.4)
MCHC: 32.7 g/dL (ref 32.0–36.0)
MCV: 98 fL (ref 79.3–98.0)
MONO#: 0.7 10*3/uL (ref 0.1–0.9)
MONO%: 9.6 % (ref 0.0–14.0)
NEUT#: 5.4 10*3/uL (ref 1.5–6.5)
NEUT%: 70.3 % (ref 39.0–75.0)
Platelets: 165 10*3/uL (ref 140–400)
RBC: 3.49 10*6/uL — ABNORMAL LOW (ref 4.20–5.82)
RDW: 13.2 % (ref 11.0–14.6)
RETIC %: 2.4 % — AB (ref 0.80–1.80)
Retic Ct Abs: 83.76 10*3/uL (ref 34.80–93.90)
WBC: 7.7 10*3/uL (ref 4.0–10.3)
lymph#: 1.5 10*3/uL (ref 0.9–3.3)

## 2016-03-13 LAB — COMPREHENSIVE METABOLIC PANEL
ALT: 28 U/L (ref 0–55)
ANION GAP: 8 meq/L (ref 3–11)
AST: 25 U/L (ref 5–34)
Albumin: 2.9 g/dL — ABNORMAL LOW (ref 3.5–5.0)
Alkaline Phosphatase: 71 U/L (ref 40–150)
BUN: 26.2 mg/dL — AB (ref 7.0–26.0)
CALCIUM: 9 mg/dL (ref 8.4–10.4)
CHLORIDE: 112 meq/L — AB (ref 98–109)
CO2: 22 mEq/L (ref 22–29)
Creatinine: 1.5 mg/dL — ABNORMAL HIGH (ref 0.7–1.3)
EGFR: 41 mL/min/{1.73_m2} — AB (ref >90)
Glucose: 134 mg/dl (ref 70–140)
POTASSIUM: 4.6 meq/L (ref 3.5–5.1)
Sodium: 142 mEq/L (ref 136–145)
Total Bilirubin: 0.5 mg/dL (ref 0.20–1.20)
Total Protein: 5.9 g/dL — ABNORMAL LOW (ref 6.4–8.3)

## 2016-03-13 MED ORDER — ROMIPLOSTIM 250 MCG ~~LOC~~ SOLR
0.5000 ug/kg | SUBCUTANEOUS | Status: AC
Start: 2016-03-13 — End: 2018-02-04
  Administered 2016-03-13: 50 ug via SUBCUTANEOUS
  Filled 2016-03-13: qty 0.1

## 2016-03-13 MED ORDER — CYANOCOBALAMIN 1000 MCG/ML IJ SOLN
1000.0000 ug | INTRAMUSCULAR | Status: DC
Start: 2016-03-13 — End: 2020-12-25
  Administered 2016-03-13: 1000 ug via SUBCUTANEOUS

## 2016-03-13 NOTE — Telephone Encounter (Signed)
Gave patient avs report and appointments for January and February  °

## 2016-03-13 NOTE — Progress Notes (Signed)
.    Hematology oncology clinic note  Date of service: .03/13/2016  Patient Care Team: Hoyt Koch, MD as PCP - General (Internal Medicine)  CHIEF COMPLAINTS/PURPOSE OF CONSULTATION: Follow-up for ITP   Diagnosis: Idiopathic thrombocytopenic purpura   Current treatment: Nplate weekly to maintain reasonable platelet counts >50K. Requirement have ranged from 48mcg/kg to 62mcg/kg. Recently stable counts with 0.5-2 mcg/kg  Previous treatment: Steroids, IVIG.  HISTORY OF PRESENTING ILLNESS:  please see my previous clinic note from 09/28/2014 for details of initial presentation and course of treatment.  Interval history  Patient is here for follow-up regarding his ITP. He was recently in the hospital admitted with the flu with pneumonia and is currently recovering from that at a rehabilitation place. Still feels somewhat drained but is eating better and has gained back some of his lost weight. No issues with bleeding. Platelets are stable at 165K today.  MEDICAL HISTORY:  Past Medical History:  Diagnosis Date  . BPH (benign prostatic hypertrophy)   . Chronic renal insufficiency   . Coronary artery disease    CABG in 2000  . Diabetes type 2, controlled ([place])    Diet-controlled  . Diabetic neuropathy ([place])   . Dyslipidemia   . Hypertension   . ITP (idiopathic thrombocytopenic purpura)   . Osteoarthritis     SURGICAL HISTORY: Past Surgical History:  Procedure Laterality Date  . CHOLECYSTECTOMY    . CORONARY ARTERY BYPASS GRAFT  2000  . Right hip replacement  2014  . TONSILLECTOMY      SOCIAL HISTORY: Social History   Social History  . Marital status: Single    Spouse name: N/A  . Number of children: N/A  . Years of education: N/A   Occupational History  . Salesman    Social History Main Topics  . Smoking status: Former Smoker    Packs/day: 2.00    Years: 25.00  . Smokeless tobacco: Never Used  . Alcohol use No  . Drug use: No  . Sexual activity: Not  Currently   Other Topics Concern  . Not on file   Social History Narrative    Two children.     He is living in [place] which is an assisted living facility. One of his son is also moving to [facility] and has a Designer, jewellery in religious studies. Patient notes that his other son is a Systems analyst with multiple awards.  FAMILY HISTORY: Family History  Problem Relation Age of Onset  . Lung disease Father   . Cancer Brother     ALLERGIES:  is allergic to alfuzosin hcl er and penicillins.  MEDICATIONS:  Current Outpatient Prescriptions  Medication Sig Dispense Refill  . acetaminophen (TYLENOL) 500 MG tablet Take 250-1,000 mg by mouth every 6 (six) hours as needed for mild pain, moderate pain or headache.     . Ascorbic Acid (VITAMIN C) 100 MG tablet Take 100 mg by mouth daily.     Marland Kitchen aspirin EC 81 MG EC tablet Take 1 tablet (81 mg total) by mouth daily. 30 tablet 0  . cholecalciferol (VITAMIN D) 1000 units tablet Take 1,000 Units by mouth daily.    . magnesium chloride (SLOW-MAG) 64 MG TBEC SR tablet Take 1 tablet (64 mg total) by mouth daily. 60 tablet 1  . metoprolol tartrate (LOPRESSOR) 25 MG tablet Take 0.5 tablets (12.5 mg total) by mouth 2 (two) times daily. 90 tablet 0  . nitroGLYCERIN (NITROSTAT) 0.4 MG SL tablet Place 1 tablet (0.4  mg total) under the tongue every 5 (five) minutes as needed for chest pain. 25 tablet 3  . omeprazole (PRILOSEC) 20 MG capsule Take 20 mg by mouth daily.    . simvastatin (ZOCOR) 20 MG tablet Take 20 mg by mouth at bedtime.      No current facility-administered medications for this visit.    Facility-Administered Medications Ordered in Other Visits  Medication Dose Route Frequency Provider Last Rate Last Dose  . cyanocobalamin ((VITAMIN B-12)) injection 1,000 mcg  1,000 mcg Subcutaneous Q30 days [name], MD   1,000 mcg at 03/13/16 1348  . romiPLOStim (NPLATE) injection 50 mcg  0.5 mcg/kg Subcutaneous Weekly [name], MD   50 mcg at 03/13/16 1349   Romiplostim 5 mcg/kg weekly   REVIEW OF SYSTEMS:   10 point review of system is negative except as noted above  PHYSICAL EXAMINATION: ECOG PERFORMANCE STATUS: 1 - Symptomatic but completely ambulatory  Vitals:   03/13/16 1242  BP: (!) 161/41  Pulse: (!) 56  Resp: 20  Temp: 97.8 F (36.6 C)   Filed Weights   03/13/16 1242  Weight: 214 lb 11.2 oz (97.4 kg)   GENERAL: Elderly gentleman in no acute distress:alert and comfortable SKIN: skin color, texture, turgor are normal, no rashes or significant lesions EYES: normal, conjunctiva are pink and non-injected, sclera clear OROPHARYNX:no exudate, no erythema and lips, buccal mucosa, and tongue normal  NECK: supple, thyroid normal size, non-tender, without nodularity LYMPH:  no palpable lymphadenopathy in the cervical, axillary or inguinal LUNGS: Air entry bilaterally equal, No rales no rhonchi HEART: regular rate & rhythm and 2 x 6 systolic murmur over aortic area, and no lower extremity edema ABDOMEN:abdomen soft, non-tender and normal bowel sounds PSYCH: alert & oriented x 3 with fluent speech NEURO: no focal motor/sensory deficits  LABORATORY DATA:  . CBC Latest Ref Rng & Units 03/13/2016 03/04/2016 03/01/2016  WBC 4.0 - 10.3 10e3/uL 7.7 13.7(H) 6.9  Hemoglobin 13.0 - 17.1 g/dL 11.2(L) 10.4(L) 10.2(L)  Hematocrit 38.4 - 49.9 % 34.2(L) 31.3(L) 32.2(L)  Platelets 140 - 400 10e3/uL 165 170 83(L)    CMP Latest Ref Rng & Units 03/13/2016 03/04/2016 03/02/2016  Glucose 70 - 140 mg/dl 134 259(H) 133(H)  BUN 7.0 - 26.0 mg/dL 26.2(H) 40(H) 20  Creatinine 0.7 - 1.3 mg/dL 1.5(H) 1.56(H) 1.38(H)  Sodium 136 - 145 mEq/L 142 133(L) 136  Potassium 3.5 - 5.1 mEq/L 4.6 4.5 4.8  Chloride 101 - 111 mmol/L - 99(L) 102  CO2 22 - 29 mEq/L 22 23 27   Calcium 8.4 - 10.4 mg/dL 9.0 9.3 9.0  Total Protein 6.4 - 8.3 g/dL 5.9(L) - -  Total Bilirubin 0.20 - 1.20 mg/dL 0.50 - -  Alkaline Phos 40 - 150 U/L 71 - -    AST 5 - 34 U/L 25 - -  ALT 0 - 55 U/L 28 - -    ASSESSMENT & PLAN:   83 year old Caucasian male with multiple medical comorbidities with  #1 chronic ITP since 1989. Has previously been responsive to steroids and has received IVIG on one occasion. He has been maintained on 3-72mcg/kg weekly of Nplate since mid 4656 initially with Dr. Arvin Collard at [facility] and then with Dr. Jimmie Molly at [facility] in [place].  No issues with bleeding . Ultrasound abdomen showed normal spleen size SPEP- no obvious monoclonal protein. IFE showed possibility of an restrictive bands in the IgG and Lambda lanes. Patient's platelet counts have remained remarkably stable  at Romiplostim doses of 7mcg/kg and was subsequently cut down to 0.5 mcg/kg and platelets have remained stable with this dose as well.  Platelets are stable today despite his recent flu and pneumonia. Are currently at [place]  -continue Nplate at 7.8XBO/ER weekly -cbc 2 weeks and if that is stable might be able to switch him to monthly cbc -continue B12 replacement monthly for B12 deficiency.  #2  Coronary artery disease status post CABG in year 2000. Not on aspirin due to his ITP . BNP was elevated. ECHO today shows mildly reduced systolic function and LVH  No chest pain. No shortness of breath. #3 chronic kidney disease  -Continue to management as per primary care physician  #4 Recently hospitalization for influenza with pneumonia -- now recovering at rehab facility.  CBC q2weeks and CMP q4weeks RTC with Dr Irene Limbo in 2 months  Total time spent 15 minutes more than 50% time on direct patient contact counseling and coordination of care.    Sullivan Lone MD [place] Hematology/Oncology Physician [facility]  (Office):       [phone] (Work cell):  [phone] (Fax):           [phone]

## 2016-03-13 NOTE — Patient Instructions (Signed)
Romiplostim injection What is this medicine? ROMIPLOSTIM ([place] mi PLOE stim) helps your body make more platelets. This medicine is used to treat low platelets caused by chronic idiopathic thrombocytopenic purpura (ITP). This medicine may be used for other purposes; ask your health care provider or pharmacist if you have questions. What should I tell my health care provider before I take this medicine? They need to know if you have any of these conditions: -cancer or myelodysplastic syndrome -low blood counts, like low white cell, platelet, or red cell counts -take medicines that treat or prevent blood clots -an unusual or allergic reaction to romiplostim, mannitol, other medicines, foods, dyes, or preservatives -pregnant or trying to get pregnant -breast-feeding How should I use this medicine? This medicine is for injection under the skin. It is given by a health care professional in a hospital or clinic setting. A special MedGuide will be given to you before your injection. Read this information carefully each time. Talk to your pediatrician regarding the use of this medicine in children. Special care may be needed. Overdosage: If you think you have taken too much of this medicine contact a poison control center or emergency room at once. NOTE: This medicine is only for you. Do not share this medicine with others. What if I miss a dose? It is important not to miss your dose. Call your doctor or health care professional if you are unable to keep an appointment. What may interact with this medicine? Interactions are not expected. This list may not describe all possible interactions. Give your health care provider a list of all the medicines, herbs, non-prescription drugs, or dietary supplements you use. Also tell them if you smoke, drink alcohol, or use illegal drugs. Some items may interact with your medicine. What should I watch for while using this medicine? Your condition will be monitored  carefully while you are receiving this medicine. Visit your prescriber or health care professional for regular checks on your progress and for the needed blood tests. It is important to keep all appointments. What side effects may I notice from receiving this medicine? Side effects that you should report to your doctor or health care professional as soon as possible: -allergic reactions like skin rash, itching or hives, swelling of the face, lips, or tongue -shortness of breath, chest pain, swelling in a leg -unusual bleeding or bruising Side effects that usually do not require medical attention (report to your doctor or health care professional if they continue or are bothersome): -dizziness -headache -muscle aches -pain in arms and legs -stomach pain -trouble sleeping This list may not describe all possible side effects. Call your doctor for medical advice about side effects. You may report side effects to FDA at 1-800-FDA-1088. Where should I keep my medicine? This drug is given in a hospital or clinic and will not be stored at home. NOTE: This sheet is a summary. It may not cover all possible information. If you have questions about this medicine, talk to your doctor, pharmacist, or health care provider.    2016, Elsevier/Gold Standard. (2007-10-12 15:13:04) Cyanocobalamin, Vitamin B12 injection What is this medicine? CYANOCOBALAMIN (sye an oh koe BAL a min) is a man made form of vitamin B12. Vitamin B12 is used in the growth of healthy blood cells, nerve cells, and proteins in the body. It also helps with the metabolism of fats and carbohydrates. This medicine is used to treat people who can not absorb vitamin B12. This medicine may be used for other   purposes; ask your health care provider or pharmacist if you have questions. What should I tell my health care provider before I take this medicine? They need to know if you have any of these conditions: -kidney disease -Leber's  disease -megaloblastic anemia -an unusual or allergic reaction to cyanocobalamin, cobalt, other medicines, foods, dyes, or preservatives -pregnant or trying to get pregnant -breast-feeding How should I use this medicine? This medicine is injected into a muscle or deeply under the skin. It is usually given by a health care professional in a clinic or doctor's office. However, your doctor may teach you how to inject yourself. Follow all instructions. Talk to your pediatrician regarding the use of this medicine in children. Special care may be needed. Overdosage: If you think you have taken too much of this medicine contact a poison control center or emergency room at once. NOTE: This medicine is only for you. Do not share this medicine with others. What if I miss a dose? If you are given your dose at a clinic or doctor's office, call to reschedule your appointment. If you give your own injections and you miss a dose, take it as soon as you can. If it is almost time for your next dose, take only that dose. Do not take double or extra doses. What may interact with this medicine? -colchicine -heavy alcohol intake This list may not describe all possible interactions. Give your health care provider a list of all the medicines, herbs, non-prescription drugs, or dietary supplements you use. Also tell them if you smoke, drink alcohol, or use illegal drugs. Some items may interact with your medicine. What should I watch for while using this medicine? Visit your doctor or health care professional regularly. You may need blood work done while you are taking this medicine. You may need to follow a special diet. Talk to your doctor. Limit your alcohol intake and avoid smoking to get the best benefit. What side effects may I notice from receiving this medicine? Side effects that you should report to your doctor or health care professional as soon as possible: -allergic reactions like skin rash, itching or  hives, swelling of the face, lips, or tongue -blue tint to skin -chest tightness, pain -difficulty breathing, wheezing -dizziness -red, swollen painful area on the leg Side effects that usually do not require medical attention (report to your doctor or health care professional if they continue or are bothersome): -diarrhea -headache This list may not describe all possible side effects. Call your doctor for medical advice about side effects. You may report side effects to FDA at 1-800-FDA-1088. Where should I keep my medicine? Keep out of the reach of children. Store at room temperature between 15 and 30 degrees C (59 and 85 degrees F). Protect from light. Throw away any unused medicine after the expiration date. NOTE: This sheet is a summary. It may not cover all possible information. If you have questions about this medicine, talk to your doctor, pharmacist, or health care provider.    2016, Elsevier/Gold Standard. (2007-05-25 22:10:20)  

## 2016-03-19 ENCOUNTER — Ambulatory Visit (HOSPITAL_BASED_OUTPATIENT_CLINIC_OR_DEPARTMENT_OTHER): Payer: Medicare Other

## 2016-03-19 VITALS — BP 148/51 | HR 54 | Temp 97.5°F | Resp 18

## 2016-03-19 DIAGNOSIS — D693 Immune thrombocytopenic purpura: Secondary | ICD-10-CM

## 2016-03-19 DIAGNOSIS — Z95828 Presence of other vascular implants and grafts: Secondary | ICD-10-CM

## 2016-03-19 MED ORDER — ROMIPLOSTIM 250 MCG ~~LOC~~ SOLR
0.5000 ug/kg | SUBCUTANEOUS | Status: DC
  Administered 2016-03-19: 50 ug via SUBCUTANEOUS
  Filled 2016-03-19: qty 0.1

## 2016-03-19 NOTE — Patient Instructions (Signed)
Romiplostim injection What is this medicine? ROMIPLOSTIM ([place] mi PLOE stim) helps your body make more platelets. This medicine is used to treat low platelets caused by chronic idiopathic thrombocytopenic purpura (ITP). This medicine may be used for other purposes; ask your health care provider or pharmacist if you have questions. What should I tell my health care provider before I take this medicine? They need to know if you have any of these conditions: -cancer or myelodysplastic syndrome -low blood counts, like low white cell, platelet, or red cell counts -take medicines that treat or prevent blood clots -an unusual or allergic reaction to romiplostim, mannitol, other medicines, foods, dyes, or preservatives -pregnant or trying to get pregnant -breast-feeding How should I use this medicine? This medicine is for injection under the skin. It is given by a health care professional in a hospital or clinic setting. A special MedGuide will be given to you before your injection. Read this information carefully each time. Talk to your pediatrician regarding the use of this medicine in children. Special care may be needed. Overdosage: If you think you have taken too much of this medicine contact a poison control center or emergency room at once. NOTE: This medicine is only for you. Do not share this medicine with others. What if I miss a dose? It is important not to miss your dose. Call your doctor or health care professional if you are unable to keep an appointment. What may interact with this medicine? Interactions are not expected. This list may not describe all possible interactions. Give your health care provider a list of all the medicines, herbs, non-prescription drugs, or dietary supplements you use. Also tell them if you smoke, drink alcohol, or use illegal drugs. Some items may interact with your medicine. What should I watch for while using this medicine? Your condition will be monitored  carefully while you are receiving this medicine. Visit your prescriber or health care professional for regular checks on your progress and for the needed blood tests. It is important to keep all appointments. What side effects may I notice from receiving this medicine? Side effects that you should report to your doctor or health care professional as soon as possible: -allergic reactions like skin rash, itching or hives, swelling of the face, lips, or tongue -shortness of breath, chest pain, swelling in a leg -unusual bleeding or bruising Side effects that usually do not require medical attention (report to your doctor or health care professional if they continue or are bothersome): -dizziness -headache -muscle aches -pain in arms and legs -stomach pain -trouble sleeping This list may not describe all possible side effects. Call your doctor for medical advice about side effects. You may report side effects to FDA at 1-800-FDA-1088. Where should I keep my medicine? This drug is given in a hospital or clinic and will not be stored at home. NOTE: This sheet is a summary. It may not cover all possible information. If you have questions about this medicine, talk to your doctor, pharmacist, or health care provider.    2016, Elsevier/Gold Standard. (2007-10-12 15:13:04) Cyanocobalamin, Vitamin B12 injection What is this medicine? CYANOCOBALAMIN (sye an oh koe BAL a min) is a man made form of vitamin B12. Vitamin B12 is used in the growth of healthy blood cells, nerve cells, and proteins in the body. It also helps with the metabolism of fats and carbohydrates. This medicine is used to treat people who can not absorb vitamin B12. This medicine may be used for other   purposes; ask your health care provider or pharmacist if you have questions. What should I tell my health care provider before I take this medicine? They need to know if you have any of these conditions: -kidney disease -Leber's  disease -megaloblastic anemia -an unusual or allergic reaction to cyanocobalamin, cobalt, other medicines, foods, dyes, or preservatives -pregnant or trying to get pregnant -breast-feeding How should I use this medicine? This medicine is injected into a muscle or deeply under the skin. It is usually given by a health care professional in a clinic or doctor's office. However, your doctor may teach you how to inject yourself. Follow all instructions. Talk to your pediatrician regarding the use of this medicine in children. Special care may be needed. Overdosage: If you think you have taken too much of this medicine contact a poison control center or emergency room at once. NOTE: This medicine is only for you. Do not share this medicine with others. What if I miss a dose? If you are given your dose at a clinic or doctor's office, call to reschedule your appointment. If you give your own injections and you miss a dose, take it as soon as you can. If it is almost time for your next dose, take only that dose. Do not take double or extra doses. What may interact with this medicine? -colchicine -heavy alcohol intake This list may not describe all possible interactions. Give your health care provider a list of all the medicines, herbs, non-prescription drugs, or dietary supplements you use. Also tell them if you smoke, drink alcohol, or use illegal drugs. Some items may interact with your medicine. What should I watch for while using this medicine? Visit your doctor or health care professional regularly. You may need blood work done while you are taking this medicine. You may need to follow a special diet. Talk to your doctor. Limit your alcohol intake and avoid smoking to get the best benefit. What side effects may I notice from receiving this medicine? Side effects that you should report to your doctor or health care professional as soon as possible: -allergic reactions like skin rash, itching or  hives, swelling of the face, lips, or tongue -blue tint to skin -chest tightness, pain -difficulty breathing, wheezing -dizziness -red, swollen painful area on the leg Side effects that usually do not require medical attention (report to your doctor or health care professional if they continue or are bothersome): -diarrhea -headache This list may not describe all possible side effects. Call your doctor for medical advice about side effects. You may report side effects to FDA at 1-800-FDA-1088. Where should I keep my medicine? Keep out of the reach of children. Store at room temperature between 15 and 30 degrees C (59 and 85 degrees F). Protect from light. Throw away any unused medicine after the expiration date. NOTE: This sheet is a summary. It may not cover all possible information. If you have questions about this medicine, talk to your doctor, pharmacist, or health care provider.    2016, Elsevier/Gold Standard. (2007-05-25 22:10:20)  

## 2016-03-20 DIAGNOSIS — E1140 Type 2 diabetes mellitus with diabetic neuropathy, unspecified: Secondary | ICD-10-CM | POA: Diagnosis not present

## 2016-03-20 DIAGNOSIS — M1991 Primary osteoarthritis, unspecified site: Secondary | ICD-10-CM | POA: Diagnosis not present

## 2016-03-20 DIAGNOSIS — I129 Hypertensive chronic kidney disease with stage 1 through stage 4 chronic kidney disease, or unspecified chronic kidney disease: Secondary | ICD-10-CM | POA: Diagnosis not present

## 2016-03-21 DIAGNOSIS — M1991 Primary osteoarthritis, unspecified site: Secondary | ICD-10-CM | POA: Diagnosis not present

## 2016-03-21 DIAGNOSIS — E1140 Type 2 diabetes mellitus with diabetic neuropathy, unspecified: Secondary | ICD-10-CM | POA: Diagnosis not present

## 2016-03-22 DIAGNOSIS — I10 Essential (primary) hypertension: Secondary | ICD-10-CM | POA: Diagnosis not present

## 2016-03-22 DIAGNOSIS — M7989 Other specified soft tissue disorders: Secondary | ICD-10-CM | POA: Diagnosis not present

## 2016-03-22 DIAGNOSIS — M1990 Unspecified osteoarthritis, unspecified site: Secondary | ICD-10-CM | POA: Diagnosis not present

## 2016-03-22 DIAGNOSIS — M6281 Muscle weakness (generalized): Secondary | ICD-10-CM | POA: Diagnosis not present

## 2016-03-22 DIAGNOSIS — D696 Thrombocytopenia, unspecified: Secondary | ICD-10-CM | POA: Diagnosis not present

## 2016-03-25 DIAGNOSIS — E1140 Type 2 diabetes mellitus with diabetic neuropathy, unspecified: Secondary | ICD-10-CM | POA: Diagnosis not present

## 2016-03-25 DIAGNOSIS — M1991 Primary osteoarthritis, unspecified site: Secondary | ICD-10-CM | POA: Diagnosis not present

## 2016-03-26 ENCOUNTER — Ambulatory Visit (HOSPITAL_BASED_OUTPATIENT_CLINIC_OR_DEPARTMENT_OTHER): Payer: Medicare Other

## 2016-03-26 ENCOUNTER — Other Ambulatory Visit (HOSPITAL_BASED_OUTPATIENT_CLINIC_OR_DEPARTMENT_OTHER): Payer: Medicare Other

## 2016-03-26 VITALS — BP 151/56 | HR 51 | Temp 98.4°F | Resp 18

## 2016-03-26 DIAGNOSIS — D693 Immune thrombocytopenic purpura: Secondary | ICD-10-CM | POA: Diagnosis present

## 2016-03-26 DIAGNOSIS — Z95828 Presence of other vascular implants and grafts: Secondary | ICD-10-CM

## 2016-03-26 LAB — CBC & DIFF AND RETIC
BASO%: 0.2 % (ref 0.0–2.0)
Basophils Absolute: 0 10*3/uL (ref 0.0–0.1)
EOS%: 2.4 % (ref 0.0–7.0)
Eosinophils Absolute: 0.1 10*3/uL (ref 0.0–0.5)
HCT: 34.4 % — ABNORMAL LOW (ref 38.4–49.9)
HGB: 11.2 g/dL — ABNORMAL LOW (ref 13.0–17.1)
Immature Retic Fract: 11.6 % — ABNORMAL HIGH (ref 3.00–10.60)
LYMPH#: 1.1 10*3/uL (ref 0.9–3.3)
LYMPH%: 23.1 % (ref 14.0–49.0)
MCH: 32.1 pg (ref 27.2–33.4)
MCHC: 32.6 g/dL (ref 32.0–36.0)
MCV: 98.6 fL — AB (ref 79.3–98.0)
MONO#: 0.7 10*3/uL (ref 0.1–0.9)
MONO%: 14.1 % — AB (ref 0.0–14.0)
NEUT%: 60.2 % (ref 39.0–75.0)
NEUTROS ABS: 2.8 10*3/uL (ref 1.5–6.5)
NRBC: 0 % (ref 0–0)
Platelets: 227 10*3/uL (ref 140–400)
RBC: 3.49 10*6/uL — AB (ref 4.20–5.82)
RDW: 13.7 % (ref 11.0–14.6)
RETIC %: 2.22 % — AB (ref 0.80–1.80)
Retic Ct Abs: 77.48 10*3/uL (ref 34.80–93.90)
WBC: 4.7 10*3/uL (ref 4.0–10.3)

## 2016-03-26 MED ORDER — ROMIPLOSTIM 250 MCG ~~LOC~~ SOLR
0.5000 ug/kg | SUBCUTANEOUS | Status: DC
  Administered 2016-03-26: 50 ug via SUBCUTANEOUS
  Filled 2016-03-26: qty 0.1

## 2016-03-27 DIAGNOSIS — E1140 Type 2 diabetes mellitus with diabetic neuropathy, unspecified: Secondary | ICD-10-CM | POA: Diagnosis not present

## 2016-03-27 DIAGNOSIS — M1991 Primary osteoarthritis, unspecified site: Secondary | ICD-10-CM | POA: Diagnosis not present

## 2016-03-28 ENCOUNTER — Encounter (HOSPITAL_BASED_OUTPATIENT_CLINIC_OR_DEPARTMENT_OTHER): Payer: Self-pay

## 2016-03-28 ENCOUNTER — Ambulatory Visit (HOSPITAL_BASED_OUTPATIENT_CLINIC_OR_DEPARTMENT_OTHER): Admit: 2016-03-28 | Payer: Medicare Other | Admitting: Urology

## 2016-03-28 DIAGNOSIS — E1140 Type 2 diabetes mellitus with diabetic neuropathy, unspecified: Secondary | ICD-10-CM | POA: Diagnosis not present

## 2016-03-28 DIAGNOSIS — M1991 Primary osteoarthritis, unspecified site: Secondary | ICD-10-CM | POA: Diagnosis not present

## 2016-03-28 SURGERY — TURP (TRANSURETHRAL RESECTION OF PROSTATE)
Anesthesia: Choice

## 2016-03-29 DIAGNOSIS — E1140 Type 2 diabetes mellitus with diabetic neuropathy, unspecified: Secondary | ICD-10-CM | POA: Diagnosis not present

## 2016-03-29 DIAGNOSIS — M1991 Primary osteoarthritis, unspecified site: Secondary | ICD-10-CM | POA: Diagnosis not present

## 2016-04-01 DIAGNOSIS — E1140 Type 2 diabetes mellitus with diabetic neuropathy, unspecified: Secondary | ICD-10-CM | POA: Diagnosis not present

## 2016-04-01 DIAGNOSIS — M1991 Primary osteoarthritis, unspecified site: Secondary | ICD-10-CM | POA: Diagnosis not present

## 2016-04-02 ENCOUNTER — Ambulatory Visit (HOSPITAL_BASED_OUTPATIENT_CLINIC_OR_DEPARTMENT_OTHER): Payer: Medicare Other

## 2016-04-02 VITALS — BP 150/74 | HR 58 | Temp 97.0°F | Resp 22

## 2016-04-02 DIAGNOSIS — Z95828 Presence of other vascular implants and grafts: Secondary | ICD-10-CM

## 2016-04-02 DIAGNOSIS — D693 Immune thrombocytopenic purpura: Secondary | ICD-10-CM | POA: Diagnosis present

## 2016-04-02 MED ORDER — ROMIPLOSTIM 250 MCG ~~LOC~~ SOLR
50.0000 ug | SUBCUTANEOUS | Status: DC
  Administered 2016-04-02: 50 ug via SUBCUTANEOUS
  Filled 2016-04-02: qty 0.1

## 2016-04-02 NOTE — Patient Instructions (Signed)
Romiplostim injection What is this medicine? ROMIPLOSTIM ([place] mi PLOE stim) helps your body make more platelets. This medicine is used to treat low platelets caused by chronic idiopathic thrombocytopenic purpura (ITP). This medicine may be used for other purposes; ask your health care provider or pharmacist if you have questions. What should I tell my health care provider before I take this medicine? They need to know if you have any of these conditions: -cancer or myelodysplastic syndrome -low blood counts, like low white cell, platelet, or red cell counts -take medicines that treat or prevent blood clots -an unusual or allergic reaction to romiplostim, mannitol, other medicines, foods, dyes, or preservatives -pregnant or trying to get pregnant -breast-feeding How should I use this medicine? This medicine is for injection under the skin. It is given by a health care professional in a hospital or clinic setting. A special MedGuide will be given to you before your injection. Read this information carefully each time. Talk to your pediatrician regarding the use of this medicine in children. Special care may be needed. Overdosage: If you think you have taken too much of this medicine contact a poison control center or emergency room at once. NOTE: This medicine is only for you. Do not share this medicine with others. What if I miss a dose? It is important not to miss your dose. Call your doctor or health care professional if you are unable to keep an appointment. What may interact with this medicine? Interactions are not expected. This list may not describe all possible interactions. Give your health care provider a list of all the medicines, herbs, non-prescription drugs, or dietary supplements you use. Also tell them if you smoke, drink alcohol, or use illegal drugs. Some items may interact with your medicine. What should I watch for while using this medicine? Your condition will be monitored  carefully while you are receiving this medicine. Visit your prescriber or health care professional for regular checks on your progress and for the needed blood tests. It is important to keep all appointments. What side effects may I notice from receiving this medicine? Side effects that you should report to your doctor or health care professional as soon as possible: -allergic reactions like skin rash, itching or hives, swelling of the face, lips, or tongue -shortness of breath, chest pain, swelling in a leg -unusual bleeding or bruising Side effects that usually do not require medical attention (report to your doctor or health care professional if they continue or are bothersome): -dizziness -headache -muscle aches -pain in arms and legs -stomach pain -trouble sleeping This list may not describe all possible side effects. Call your doctor for medical advice about side effects. You may report side effects to FDA at 1-800-FDA-1088. Where should I keep my medicine? This drug is given in a hospital or clinic and will not be stored at home. NOTE: This sheet is a summary. It may not cover all possible information. If you have questions about this medicine, talk to your doctor, pharmacist, or health care provider.    2016, Elsevier/Gold Standard. (2007-10-12 15:13:04) Cyanocobalamin, Vitamin B12 injection What is this medicine? CYANOCOBALAMIN (sye an oh koe BAL a min) is a man made form of vitamin B12. Vitamin B12 is used in the growth of healthy blood cells, nerve cells, and proteins in the body. It also helps with the metabolism of fats and carbohydrates. This medicine is used to treat people who can not absorb vitamin B12. This medicine may be used for other   purposes; ask your health care provider or pharmacist if you have questions. What should I tell my health care provider before I take this medicine? They need to know if you have any of these conditions: -kidney disease -Leber's  disease -megaloblastic anemia -an unusual or allergic reaction to cyanocobalamin, cobalt, other medicines, foods, dyes, or preservatives -pregnant or trying to get pregnant -breast-feeding How should I use this medicine? This medicine is injected into a muscle or deeply under the skin. It is usually given by a health care professional in a clinic or doctor's office. However, your doctor may teach you how to inject yourself. Follow all instructions. Talk to your pediatrician regarding the use of this medicine in children. Special care may be needed. Overdosage: If you think you have taken too much of this medicine contact a poison control center or emergency room at once. NOTE: This medicine is only for you. Do not share this medicine with others. What if I miss a dose? If you are given your dose at a clinic or doctor's office, call to reschedule your appointment. If you give your own injections and you miss a dose, take it as soon as you can. If it is almost time for your next dose, take only that dose. Do not take double or extra doses. What may interact with this medicine? -colchicine -heavy alcohol intake This list may not describe all possible interactions. Give your health care provider a list of all the medicines, herbs, non-prescription drugs, or dietary supplements you use. Also tell them if you smoke, drink alcohol, or use illegal drugs. Some items may interact with your medicine. What should I watch for while using this medicine? Visit your doctor or health care professional regularly. You may need blood work done while you are taking this medicine. You may need to follow a special diet. Talk to your doctor. Limit your alcohol intake and avoid smoking to get the best benefit. What side effects may I notice from receiving this medicine? Side effects that you should report to your doctor or health care professional as soon as possible: -allergic reactions like skin rash, itching or  hives, swelling of the face, lips, or tongue -blue tint to skin -chest tightness, pain -difficulty breathing, wheezing -dizziness -red, swollen painful area on the leg Side effects that usually do not require medical attention (report to your doctor or health care professional if they continue or are bothersome): -diarrhea -headache This list may not describe all possible side effects. Call your doctor for medical advice about side effects. You may report side effects to FDA at 1-800-FDA-1088. Where should I keep my medicine? Keep out of the reach of children. Store at room temperature between 15 and 30 degrees C (59 and 85 degrees F). Protect from light. Throw away any unused medicine after the expiration date. NOTE: This sheet is a summary. It may not cover all possible information. If you have questions about this medicine, talk to your doctor, pharmacist, or health care provider.    2016, Elsevier/Gold Standard. (2007-05-25 22:10:20)  

## 2016-04-04 DIAGNOSIS — E1140 Type 2 diabetes mellitus with diabetic neuropathy, unspecified: Secondary | ICD-10-CM | POA: Diagnosis not present

## 2016-04-04 DIAGNOSIS — M1991 Primary osteoarthritis, unspecified site: Secondary | ICD-10-CM | POA: Diagnosis not present

## 2016-04-04 DIAGNOSIS — L570 Actinic keratosis: Secondary | ICD-10-CM | POA: Diagnosis not present

## 2016-04-05 DIAGNOSIS — E1140 Type 2 diabetes mellitus with diabetic neuropathy, unspecified: Secondary | ICD-10-CM | POA: Diagnosis not present

## 2016-04-05 DIAGNOSIS — M1991 Primary osteoarthritis, unspecified site: Secondary | ICD-10-CM | POA: Diagnosis not present

## 2016-04-09 ENCOUNTER — Other Ambulatory Visit (HOSPITAL_BASED_OUTPATIENT_CLINIC_OR_DEPARTMENT_OTHER): Payer: Medicare Other

## 2016-04-09 ENCOUNTER — Ambulatory Visit (HOSPITAL_BASED_OUTPATIENT_CLINIC_OR_DEPARTMENT_OTHER): Payer: Medicare Other

## 2016-04-09 VITALS — BP 160/72 | HR 54 | Temp 97.0°F | Resp 20

## 2016-04-09 DIAGNOSIS — D693 Immune thrombocytopenic purpura: Secondary | ICD-10-CM

## 2016-04-09 DIAGNOSIS — Z95828 Presence of other vascular implants and grafts: Secondary | ICD-10-CM

## 2016-04-09 LAB — CBC & DIFF AND RETIC
BASO%: 0.2 % (ref 0.0–2.0)
Basophils Absolute: 0 10*3/uL (ref 0.0–0.1)
EOS ABS: 0 10*3/uL (ref 0.0–0.5)
EOS%: 0.7 % (ref 0.0–7.0)
HEMATOCRIT: 36.9 % — AB (ref 38.4–49.9)
HGB: 11.9 g/dL — ABNORMAL LOW (ref 13.0–17.1)
IMMATURE RETIC FRACT: 11.9 % — AB (ref 3.00–10.60)
LYMPH#: 1.4 10*3/uL (ref 0.9–3.3)
LYMPH%: 24.6 % (ref 14.0–49.0)
MCH: 32.1 pg (ref 27.2–33.4)
MCHC: 32.2 g/dL (ref 32.0–36.0)
MCV: 99.5 fL — ABNORMAL HIGH (ref 79.3–98.0)
MONO#: 0.7 10*3/uL (ref 0.1–0.9)
MONO%: 11.3 % (ref 0.0–14.0)
NEUT#: 3.6 10*3/uL (ref 1.5–6.5)
NEUT%: 63.2 % (ref 39.0–75.0)
PLATELETS: 188 10*3/uL (ref 140–400)
RBC: 3.71 10*6/uL — AB (ref 4.20–5.82)
RDW: 13.3 % (ref 11.0–14.6)
RETIC CT ABS: 90.15 10*3/uL (ref 34.80–93.90)
Retic %: 2.43 % — ABNORMAL HIGH (ref 0.80–1.80)
WBC: 5.7 10*3/uL (ref 4.0–10.3)

## 2016-04-09 LAB — COMPREHENSIVE METABOLIC PANEL
ALT: 23 U/L (ref 0–55)
ANION GAP: 9 meq/L (ref 3–11)
AST: 27 U/L (ref 5–34)
Albumin: 3.4 g/dL — ABNORMAL LOW (ref 3.5–5.0)
Alkaline Phosphatase: 72 U/L (ref 40–150)
BILIRUBIN TOTAL: 0.53 mg/dL (ref 0.20–1.20)
BUN: 14 mg/dL (ref 7.0–26.0)
CO2: 25 meq/L (ref 22–29)
CREATININE: 1.3 mg/dL (ref 0.7–1.3)
Calcium: 9.6 mg/dL (ref 8.4–10.4)
Chloride: 108 mEq/L (ref 98–109)
EGFR: 45 mL/min/{1.73_m2} — ABNORMAL LOW (ref >90)
Glucose: 148 mg/dl — ABNORMAL HIGH (ref 70–140)
Potassium: 4.8 mEq/L (ref 3.5–5.1)
Sodium: 142 mEq/L (ref 136–145)
TOTAL PROTEIN: 6.4 g/dL (ref 6.4–8.3)

## 2016-04-09 MED ORDER — ROMIPLOSTIM 250 MCG ~~LOC~~ SOLR
50.0000 ug | SUBCUTANEOUS | Status: DC
  Administered 2016-04-09: 50 ug via SUBCUTANEOUS
  Filled 2016-04-09: qty 0.1

## 2016-04-09 NOTE — Patient Instructions (Signed)
Romiplostim injection What is this medicine? ROMIPLOSTIM ([place] mi PLOE stim) helps your body make more platelets. This medicine is used to treat low platelets caused by chronic idiopathic thrombocytopenic purpura (ITP). This medicine may be used for other purposes; ask your health care provider or pharmacist if you have questions. What should I tell my health care provider before I take this medicine? They need to know if you have any of these conditions: -cancer or myelodysplastic syndrome -low blood counts, like low white cell, platelet, or red cell counts -take medicines that treat or prevent blood clots -an unusual or allergic reaction to romiplostim, mannitol, other medicines, foods, dyes, or preservatives -pregnant or trying to get pregnant -breast-feeding How should I use this medicine? This medicine is for injection under the skin. It is given by a health care professional in a hospital or clinic setting. A special MedGuide will be given to you before your injection. Read this information carefully each time. Talk to your pediatrician regarding the use of this medicine in children. Special care may be needed. Overdosage: If you think you have taken too much of this medicine contact a poison control center or emergency room at once. NOTE: This medicine is only for you. Do not share this medicine with others. What if I miss a dose? It is important not to miss your dose. Call your doctor or health care professional if you are unable to keep an appointment. What may interact with this medicine? Interactions are not expected. This list may not describe all possible interactions. Give your health care provider a list of all the medicines, herbs, non-prescription drugs, or dietary supplements you use. Also tell them if you smoke, drink alcohol, or use illegal drugs. Some items may interact with your medicine. What should I watch for while using this medicine? Your condition will be monitored  carefully while you are receiving this medicine. Visit your prescriber or health care professional for regular checks on your progress and for the needed blood tests. It is important to keep all appointments. What side effects may I notice from receiving this medicine? Side effects that you should report to your doctor or health care professional as soon as possible: -allergic reactions like skin rash, itching or hives, swelling of the face, lips, or tongue -shortness of breath, chest pain, swelling in a leg -unusual bleeding or bruising Side effects that usually do not require medical attention (report to your doctor or health care professional if they continue or are bothersome): -dizziness -headache -muscle aches -pain in arms and legs -stomach pain -trouble sleeping This list may not describe all possible side effects. Call your doctor for medical advice about side effects. You may report side effects to FDA at 1-800-FDA-1088. Where should I keep my medicine? This drug is given in a hospital or clinic and will not be stored at home. NOTE: This sheet is a summary. It may not cover all possible information. If you have questions about this medicine, talk to your doctor, pharmacist, or health care provider.    2016, Elsevier/Gold Standard. (2007-10-12 15:13:04) Cyanocobalamin, Vitamin B12 injection What is this medicine? CYANOCOBALAMIN (sye an oh koe BAL a min) is a man made form of vitamin B12. Vitamin B12 is used in the growth of healthy blood cells, nerve cells, and proteins in the body. It also helps with the metabolism of fats and carbohydrates. This medicine is used to treat people who can not absorb vitamin B12. This medicine may be used for other   purposes; ask your health care provider or pharmacist if you have questions. What should I tell my health care provider before I take this medicine? They need to know if you have any of these conditions: -kidney disease -Leber's  disease -megaloblastic anemia -an unusual or allergic reaction to cyanocobalamin, cobalt, other medicines, foods, dyes, or preservatives -pregnant or trying to get pregnant -breast-feeding How should I use this medicine? This medicine is injected into a muscle or deeply under the skin. It is usually given by a health care professional in a clinic or doctor's office. However, your doctor may teach you how to inject yourself. Follow all instructions. Talk to your pediatrician regarding the use of this medicine in children. Special care may be needed. Overdosage: If you think you have taken too much of this medicine contact a poison control center or emergency room at once. NOTE: This medicine is only for you. Do not share this medicine with others. What if I miss a dose? If you are given your dose at a clinic or doctor's office, call to reschedule your appointment. If you give your own injections and you miss a dose, take it as soon as you can. If it is almost time for your next dose, take only that dose. Do not take double or extra doses. What may interact with this medicine? -colchicine -heavy alcohol intake This list may not describe all possible interactions. Give your health care provider a list of all the medicines, herbs, non-prescription drugs, or dietary supplements you use. Also tell them if you smoke, drink alcohol, or use illegal drugs. Some items may interact with your medicine. What should I watch for while using this medicine? Visit your doctor or health care professional regularly. You may need blood work done while you are taking this medicine. You may need to follow a special diet. Talk to your doctor. Limit your alcohol intake and avoid smoking to get the best benefit. What side effects may I notice from receiving this medicine? Side effects that you should report to your doctor or health care professional as soon as possible: -allergic reactions like skin rash, itching or  hives, swelling of the face, lips, or tongue -blue tint to skin -chest tightness, pain -difficulty breathing, wheezing -dizziness -red, swollen painful area on the leg Side effects that usually do not require medical attention (report to your doctor or health care professional if they continue or are bothersome): -diarrhea -headache This list may not describe all possible side effects. Call your doctor for medical advice about side effects. You may report side effects to FDA at 1-800-FDA-1088. Where should I keep my medicine? Keep out of the reach of children. Store at room temperature between 15 and 30 degrees C (59 and 85 degrees F). Protect from light. Throw away any unused medicine after the expiration date. NOTE: This sheet is a summary. It may not cover all possible information. If you have questions about this medicine, talk to your doctor, pharmacist, or health care provider.    2016, Elsevier/Gold Standard. (2007-05-25 22:10:20)  

## 2016-04-10 DIAGNOSIS — E1140 Type 2 diabetes mellitus with diabetic neuropathy, unspecified: Secondary | ICD-10-CM | POA: Diagnosis not present

## 2016-04-10 DIAGNOSIS — M1991 Primary osteoarthritis, unspecified site: Secondary | ICD-10-CM | POA: Diagnosis not present

## 2016-04-11 DIAGNOSIS — M1991 Primary osteoarthritis, unspecified site: Secondary | ICD-10-CM | POA: Diagnosis not present

## 2016-04-11 DIAGNOSIS — E1140 Type 2 diabetes mellitus with diabetic neuropathy, unspecified: Secondary | ICD-10-CM | POA: Diagnosis not present

## 2016-04-16 ENCOUNTER — Ambulatory Visit (HOSPITAL_BASED_OUTPATIENT_CLINIC_OR_DEPARTMENT_OTHER): Payer: Medicare Other

## 2016-04-16 VITALS — BP 162/58 | HR 71 | Temp 97.4°F | Resp 22

## 2016-04-16 DIAGNOSIS — E1140 Type 2 diabetes mellitus with diabetic neuropathy, unspecified: Secondary | ICD-10-CM | POA: Diagnosis not present

## 2016-04-16 DIAGNOSIS — Z95828 Presence of other vascular implants and grafts: Secondary | ICD-10-CM

## 2016-04-16 DIAGNOSIS — D693 Immune thrombocytopenic purpura: Secondary | ICD-10-CM

## 2016-04-16 DIAGNOSIS — M1991 Primary osteoarthritis, unspecified site: Secondary | ICD-10-CM | POA: Diagnosis not present

## 2016-04-16 MED ORDER — CYANOCOBALAMIN 1000 MCG/ML IJ SOLN
1000.0000 ug | INTRAMUSCULAR | Status: DC
  Administered 2016-04-16: 1000 ug via SUBCUTANEOUS

## 2016-04-16 MED ORDER — ROMIPLOSTIM 250 MCG ~~LOC~~ SOLR
50.0000 ug | SUBCUTANEOUS | Status: DC
  Administered 2016-04-16: 50 ug via SUBCUTANEOUS
  Filled 2016-04-16: qty 0.1

## 2016-04-16 NOTE — Patient Instructions (Signed)
Romiplostim injection What is this medicine? ROMIPLOSTIM ([place] mi PLOE stim) helps your body make more platelets. This medicine is used to treat low platelets caused by chronic idiopathic thrombocytopenic purpura (ITP). This medicine may be used for other purposes; ask your health care provider or pharmacist if you have questions. What should I tell my health care provider before I take this medicine? They need to know if you have any of these conditions: -cancer or myelodysplastic syndrome -low blood counts, like low white cell, platelet, or red cell counts -take medicines that treat or prevent blood clots -an unusual or allergic reaction to romiplostim, mannitol, other medicines, foods, dyes, or preservatives -pregnant or trying to get pregnant -breast-feeding How should I use this medicine? This medicine is for injection under the skin. It is given by a health care professional in a hospital or clinic setting. A special MedGuide will be given to you before your injection. Read this information carefully each time. Talk to your pediatrician regarding the use of this medicine in children. Special care may be needed. Overdosage: If you think you have taken too much of this medicine contact a poison control center or emergency room at once. NOTE: This medicine is only for you. Do not share this medicine with others. What if I miss a dose? It is important not to miss your dose. Call your doctor or health care professional if you are unable to keep an appointment. What may interact with this medicine? Interactions are not expected. This list may not describe all possible interactions. Give your health care provider a list of all the medicines, herbs, non-prescription drugs, or dietary supplements you use. Also tell them if you smoke, drink alcohol, or use illegal drugs. Some items may interact with your medicine. What should I watch for while using this medicine? Your condition will be monitored  carefully while you are receiving this medicine. Visit your prescriber or health care professional for regular checks on your progress and for the needed blood tests. It is important to keep all appointments. What side effects may I notice from receiving this medicine? Side effects that you should report to your doctor or health care professional as soon as possible: -allergic reactions like skin rash, itching or hives, swelling of the face, lips, or tongue -shortness of breath, chest pain, swelling in a leg -unusual bleeding or bruising Side effects that usually do not require medical attention (report to your doctor or health care professional if they continue or are bothersome): -dizziness -headache -muscle aches -pain in arms and legs -stomach pain -trouble sleeping This list may not describe all possible side effects. Call your doctor for medical advice about side effects. You may report side effects to FDA at 1-800-FDA-1088. Where should I keep my medicine? This drug is given in a hospital or clinic and will not be stored at home. NOTE: This sheet is a summary. It may not cover all possible information. If you have questions about this medicine, talk to your doctor, pharmacist, or health care provider.    2016, Elsevier/Gold Standard. (2007-10-12 15:13:04) Cyanocobalamin, Vitamin B12 injection What is this medicine? CYANOCOBALAMIN (sye an oh koe BAL a min) is a man made form of vitamin B12. Vitamin B12 is used in the growth of healthy blood cells, nerve cells, and proteins in the body. It also helps with the metabolism of fats and carbohydrates. This medicine is used to treat people who can not absorb vitamin B12. This medicine may be used for other   purposes; ask your health care provider or pharmacist if you have questions. What should I tell my health care provider before I take this medicine? They need to know if you have any of these conditions: -kidney disease -Leber's  disease -megaloblastic anemia -an unusual or allergic reaction to cyanocobalamin, cobalt, other medicines, foods, dyes, or preservatives -pregnant or trying to get pregnant -breast-feeding How should I use this medicine? This medicine is injected into a muscle or deeply under the skin. It is usually given by a health care professional in a clinic or doctor's office. However, your doctor may teach you how to inject yourself. Follow all instructions. Talk to your pediatrician regarding the use of this medicine in children. Special care may be needed. Overdosage: If you think you have taken too much of this medicine contact a poison control center or emergency room at once. NOTE: This medicine is only for you. Do not share this medicine with others. What if I miss a dose? If you are given your dose at a clinic or doctor's office, call to reschedule your appointment. If you give your own injections and you miss a dose, take it as soon as you can. If it is almost time for your next dose, take only that dose. Do not take double or extra doses. What may interact with this medicine? -colchicine -heavy alcohol intake This list may not describe all possible interactions. Give your health care provider a list of all the medicines, herbs, non-prescription drugs, or dietary supplements you use. Also tell them if you smoke, drink alcohol, or use illegal drugs. Some items may interact with your medicine. What should I watch for while using this medicine? Visit your doctor or health care professional regularly. You may need blood work done while you are taking this medicine. You may need to follow a special diet. Talk to your doctor. Limit your alcohol intake and avoid smoking to get the best benefit. What side effects may I notice from receiving this medicine? Side effects that you should report to your doctor or health care professional as soon as possible: -allergic reactions like skin rash, itching or  hives, swelling of the face, lips, or tongue -blue tint to skin -chest tightness, pain -difficulty breathing, wheezing -dizziness -red, swollen painful area on the leg Side effects that usually do not require medical attention (report to your doctor or health care professional if they continue or are bothersome): -diarrhea -headache This list may not describe all possible side effects. Call your doctor for medical advice about side effects. You may report side effects to FDA at 1-800-FDA-1088. Where should I keep my medicine? Keep out of the reach of children. Store at room temperature between 15 and 30 degrees C (59 and 85 degrees F). Protect from light. Throw away any unused medicine after the expiration date. NOTE: This sheet is a summary. It may not cover all possible information. If you have questions about this medicine, talk to your doctor, pharmacist, or health care provider.    2016, Elsevier/Gold Standard. (2007-05-25 22:10:20)  

## 2016-04-17 DIAGNOSIS — E1140 Type 2 diabetes mellitus with diabetic neuropathy, unspecified: Secondary | ICD-10-CM | POA: Diagnosis not present

## 2016-04-17 DIAGNOSIS — M1991 Primary osteoarthritis, unspecified site: Secondary | ICD-10-CM | POA: Diagnosis not present

## 2016-04-18 DIAGNOSIS — M6281 Muscle weakness (generalized): Secondary | ICD-10-CM | POA: Diagnosis not present

## 2016-04-18 DIAGNOSIS — E119 Type 2 diabetes mellitus without complications: Secondary | ICD-10-CM | POA: Diagnosis not present

## 2016-04-18 DIAGNOSIS — I10 Essential (primary) hypertension: Secondary | ICD-10-CM | POA: Diagnosis not present

## 2016-04-18 DIAGNOSIS — M7989 Other specified soft tissue disorders: Secondary | ICD-10-CM | POA: Diagnosis not present

## 2016-04-19 DIAGNOSIS — E1140 Type 2 diabetes mellitus with diabetic neuropathy, unspecified: Secondary | ICD-10-CM | POA: Diagnosis not present

## 2016-04-19 DIAGNOSIS — M1991 Primary osteoarthritis, unspecified site: Secondary | ICD-10-CM | POA: Diagnosis not present

## 2016-04-23 ENCOUNTER — Other Ambulatory Visit (HOSPITAL_BASED_OUTPATIENT_CLINIC_OR_DEPARTMENT_OTHER): Payer: Medicare Other

## 2016-04-23 ENCOUNTER — Ambulatory Visit (HOSPITAL_BASED_OUTPATIENT_CLINIC_OR_DEPARTMENT_OTHER): Payer: Medicare Other

## 2016-04-23 VITALS — BP 165/50 | HR 68 | Temp 97.5°F | Resp 20

## 2016-04-23 DIAGNOSIS — M1991 Primary osteoarthritis, unspecified site: Secondary | ICD-10-CM | POA: Diagnosis not present

## 2016-04-23 DIAGNOSIS — D693 Immune thrombocytopenic purpura: Secondary | ICD-10-CM

## 2016-04-23 DIAGNOSIS — E1140 Type 2 diabetes mellitus with diabetic neuropathy, unspecified: Secondary | ICD-10-CM | POA: Diagnosis not present

## 2016-04-23 DIAGNOSIS — Z95828 Presence of other vascular implants and grafts: Secondary | ICD-10-CM

## 2016-04-23 LAB — CBC & DIFF AND RETIC
BASO%: 0.3 % (ref 0.0–2.0)
Basophils Absolute: 0 10*3/uL (ref 0.0–0.1)
EOS ABS: 0.1 10*3/uL (ref 0.0–0.5)
EOS%: 1.6 % (ref 0.0–7.0)
HCT: 39.1 % (ref 38.4–49.9)
HGB: 12.8 g/dL — ABNORMAL LOW (ref 13.0–17.1)
IMMATURE RETIC FRACT: 9.8 % (ref 3.00–10.60)
LYMPH%: 24.5 % (ref 14.0–49.0)
MCH: 32 pg (ref 27.2–33.4)
MCHC: 32.7 g/dL (ref 32.0–36.0)
MCV: 97.8 fL (ref 79.3–98.0)
MONO#: 0.7 10*3/uL (ref 0.1–0.9)
MONO%: 11.6 % (ref 0.0–14.0)
NEUT%: 62 % (ref 39.0–75.0)
NEUTROS ABS: 3.6 10*3/uL (ref 1.5–6.5)
Platelets: 199 10*3/uL (ref 140–400)
RBC: 4 10*6/uL — AB (ref 4.20–5.82)
RDW: 13 % (ref 11.0–14.6)
RETIC %: 2.3 % — AB (ref 0.80–1.80)
RETIC CT ABS: 92 10*3/uL (ref 34.80–93.90)
WBC: 5.8 10*3/uL (ref 4.0–10.3)
lymph#: 1.4 10*3/uL (ref 0.9–3.3)
nRBC: 0 % (ref 0–0)

## 2016-04-23 MED ORDER — ROMIPLOSTIM 250 MCG ~~LOC~~ SOLR
50.0000 ug | SUBCUTANEOUS | Status: DC
Start: 2016-04-23 — End: 2016-04-23
  Administered 2016-04-23: 50 ug via SUBCUTANEOUS
  Filled 2016-04-23: qty 0.1

## 2016-04-23 NOTE — Patient Instructions (Signed)
Romiplostim injection What is this medicine? ROMIPLOSTIM ([place] mi PLOE stim) helps your body make more platelets. This medicine is used to treat low platelets caused by chronic idiopathic thrombocytopenic purpura (ITP). This medicine may be used for other purposes; ask your health care provider or pharmacist if you have questions. What should I tell my health care provider before I take this medicine? They need to know if you have any of these conditions: -cancer or myelodysplastic syndrome -low blood counts, like low white cell, platelet, or red cell counts -take medicines that treat or prevent blood clots -an unusual or allergic reaction to romiplostim, mannitol, other medicines, foods, dyes, or preservatives -pregnant or trying to get pregnant -breast-feeding How should I use this medicine? This medicine is for injection under the skin. It is given by a health care professional in a hospital or clinic setting. A special MedGuide will be given to you before your injection. Read this information carefully each time. Talk to your pediatrician regarding the use of this medicine in children. Special care may be needed. Overdosage: If you think you have taken too much of this medicine contact a poison control center or emergency room at once. NOTE: This medicine is only for you. Do not share this medicine with others. What if I miss a dose? It is important not to miss your dose. Call your doctor or health care professional if you are unable to keep an appointment. What may interact with this medicine? Interactions are not expected. This list may not describe all possible interactions. Give your health care provider a list of all the medicines, herbs, non-prescription drugs, or dietary supplements you use. Also tell them if you smoke, drink alcohol, or use illegal drugs. Some items may interact with your medicine. What should I watch for while using this medicine? Your condition will be monitored  carefully while you are receiving this medicine. Visit your prescriber or health care professional for regular checks on your progress and for the needed blood tests. It is important to keep all appointments. What side effects may I notice from receiving this medicine? Side effects that you should report to your doctor or health care professional as soon as possible: -allergic reactions like skin rash, itching or hives, swelling of the face, lips, or tongue -shortness of breath, chest pain, swelling in a leg -unusual bleeding or bruising Side effects that usually do not require medical attention (report to your doctor or health care professional if they continue or are bothersome): -dizziness -headache -muscle aches -pain in arms and legs -stomach pain -trouble sleeping This list may not describe all possible side effects. Call your doctor for medical advice about side effects. You may report side effects to FDA at 1-800-FDA-1088. Where should I keep my medicine? This drug is given in a hospital or clinic and will not be stored at home. NOTE: This sheet is a summary. It may not cover all possible information. If you have questions about this medicine, talk to your doctor, pharmacist, or health care provider.    2016, Elsevier/Gold Standard. (2007-10-12 15:13:04) Cyanocobalamin, Vitamin B12 injection What is this medicine? CYANOCOBALAMIN (sye an oh koe BAL a min) is a man made form of vitamin B12. Vitamin B12 is used in the growth of healthy blood cells, nerve cells, and proteins in the body. It also helps with the metabolism of fats and carbohydrates. This medicine is used to treat people who can not absorb vitamin B12. This medicine may be used for other   purposes; ask your health care provider or pharmacist if you have questions. What should I tell my health care provider before I take this medicine? They need to know if you have any of these conditions: -kidney disease -Leber's  disease -megaloblastic anemia -an unusual or allergic reaction to cyanocobalamin, cobalt, other medicines, foods, dyes, or preservatives -pregnant or trying to get pregnant -breast-feeding How should I use this medicine? This medicine is injected into a muscle or deeply under the skin. It is usually given by a health care professional in a clinic or doctor's office. However, your doctor may teach you how to inject yourself. Follow all instructions. Talk to your pediatrician regarding the use of this medicine in children. Special care may be needed. Overdosage: If you think you have taken too much of this medicine contact a poison control center or emergency room at once. NOTE: This medicine is only for you. Do not share this medicine with others. What if I miss a dose? If you are given your dose at a clinic or doctor's office, call to reschedule your appointment. If you give your own injections and you miss a dose, take it as soon as you can. If it is almost time for your next dose, take only that dose. Do not take double or extra doses. What may interact with this medicine? -colchicine -heavy alcohol intake This list may not describe all possible interactions. Give your health care provider a list of all the medicines, herbs, non-prescription drugs, or dietary supplements you use. Also tell them if you smoke, drink alcohol, or use illegal drugs. Some items may interact with your medicine. What should I watch for while using this medicine? Visit your doctor or health care professional regularly. You may need blood work done while you are taking this medicine. You may need to follow a special diet. Talk to your doctor. Limit your alcohol intake and avoid smoking to get the best benefit. What side effects may I notice from receiving this medicine? Side effects that you should report to your doctor or health care professional as soon as possible: -allergic reactions like skin rash, itching or  hives, swelling of the face, lips, or tongue -blue tint to skin -chest tightness, pain -difficulty breathing, wheezing -dizziness -red, swollen painful area on the leg Side effects that usually do not require medical attention (report to your doctor or health care professional if they continue or are bothersome): -diarrhea -headache This list may not describe all possible side effects. Call your doctor for medical advice about side effects. You may report side effects to FDA at 1-800-FDA-1088. Where should I keep my medicine? Keep out of the reach of children. Store at room temperature between 15 and 30 degrees C (59 and 85 degrees F). Protect from light. Throw away any unused medicine after the expiration date. NOTE: This sheet is a summary. It may not cover all possible information. If you have questions about this medicine, talk to your doctor, pharmacist, or health care provider.    2016, Elsevier/Gold Standard. (2007-05-25 22:10:20)  

## 2016-04-24 DIAGNOSIS — E119 Type 2 diabetes mellitus without complications: Secondary | ICD-10-CM | POA: Diagnosis not present

## 2016-04-24 DIAGNOSIS — I10 Essential (primary) hypertension: Secondary | ICD-10-CM | POA: Diagnosis not present

## 2016-04-25 DIAGNOSIS — M1991 Primary osteoarthritis, unspecified site: Secondary | ICD-10-CM | POA: Diagnosis not present

## 2016-04-25 DIAGNOSIS — E1140 Type 2 diabetes mellitus with diabetic neuropathy, unspecified: Secondary | ICD-10-CM | POA: Diagnosis not present

## 2016-04-30 ENCOUNTER — Ambulatory Visit (HOSPITAL_BASED_OUTPATIENT_CLINIC_OR_DEPARTMENT_OTHER): Payer: Medicare Other

## 2016-04-30 VITALS — BP 158/58 | HR 69 | Temp 97.7°F | Resp 22

## 2016-04-30 DIAGNOSIS — E1140 Type 2 diabetes mellitus with diabetic neuropathy, unspecified: Secondary | ICD-10-CM | POA: Diagnosis not present

## 2016-04-30 DIAGNOSIS — M1991 Primary osteoarthritis, unspecified site: Secondary | ICD-10-CM | POA: Diagnosis not present

## 2016-04-30 DIAGNOSIS — Z95828 Presence of other vascular implants and grafts: Secondary | ICD-10-CM

## 2016-04-30 DIAGNOSIS — D693 Immune thrombocytopenic purpura: Secondary | ICD-10-CM

## 2016-04-30 MED ORDER — ROMIPLOSTIM 250 MCG ~~LOC~~ SOLR
50.0000 ug | SUBCUTANEOUS | Status: DC
  Administered 2016-04-30: 50 ug via SUBCUTANEOUS
  Filled 2016-04-30: qty 0.1

## 2016-04-30 NOTE — Patient Instructions (Signed)
Romiplostim injection What is this medicine? ROMIPLOSTIM ([place] mi PLOE stim) helps your body make more platelets. This medicine is used to treat low platelets caused by chronic idiopathic thrombocytopenic purpura (ITP). This medicine may be used for other purposes; ask your health care provider or pharmacist if you have questions. What should I tell my health care provider before I take this medicine? They need to know if you have any of these conditions: -cancer or myelodysplastic syndrome -low blood counts, like low white cell, platelet, or red cell counts -take medicines that treat or prevent blood clots -an unusual or allergic reaction to romiplostim, mannitol, other medicines, foods, dyes, or preservatives -pregnant or trying to get pregnant -breast-feeding How should I use this medicine? This medicine is for injection under the skin. It is given by a health care professional in a hospital or clinic setting. A special MedGuide will be given to you before your injection. Read this information carefully each time. Talk to your pediatrician regarding the use of this medicine in children. Special care may be needed. Overdosage: If you think you have taken too much of this medicine contact a poison control center or emergency room at once. NOTE: This medicine is only for you. Do not share this medicine with others. What if I miss a dose? It is important not to miss your dose. Call your doctor or health care professional if you are unable to keep an appointment. What may interact with this medicine? Interactions are not expected. This list may not describe all possible interactions. Give your health care provider a list of all the medicines, herbs, non-prescription drugs, or dietary supplements you use. Also tell them if you smoke, drink alcohol, or use illegal drugs. Some items may interact with your medicine. What should I watch for while using this medicine? Your condition will be monitored  carefully while you are receiving this medicine. Visit your prescriber or health care professional for regular checks on your progress and for the needed blood tests. It is important to keep all appointments. What side effects may I notice from receiving this medicine? Side effects that you should report to your doctor or health care professional as soon as possible: -allergic reactions like skin rash, itching or hives, swelling of the face, lips, or tongue -shortness of breath, chest pain, swelling in a leg -unusual bleeding or bruising Side effects that usually do not require medical attention (report to your doctor or health care professional if they continue or are bothersome): -dizziness -headache -muscle aches -pain in arms and legs -stomach pain -trouble sleeping This list may not describe all possible side effects. Call your doctor for medical advice about side effects. You may report side effects to FDA at 1-800-FDA-1088. Where should I keep my medicine? This drug is given in a hospital or clinic and will not be stored at home. NOTE: This sheet is a summary. It may not cover all possible information. If you have questions about this medicine, talk to your doctor, pharmacist, or health care provider.    2016, Elsevier/Gold Standard. (2007-10-12 15:13:04) Cyanocobalamin, Vitamin B12 injection What is this medicine? CYANOCOBALAMIN (sye an oh koe BAL a min) is a man made form of vitamin B12. Vitamin B12 is used in the growth of healthy blood cells, nerve cells, and proteins in the body. It also helps with the metabolism of fats and carbohydrates. This medicine is used to treat people who can not absorb vitamin B12. This medicine may be used for other   purposes; ask your health care provider or pharmacist if you have questions. What should I tell my health care provider before I take this medicine? They need to know if you have any of these conditions: -kidney disease -Leber's  disease -megaloblastic anemia -an unusual or allergic reaction to cyanocobalamin, cobalt, other medicines, foods, dyes, or preservatives -pregnant or trying to get pregnant -breast-feeding How should I use this medicine? This medicine is injected into a muscle or deeply under the skin. It is usually given by a health care professional in a clinic or doctor's office. However, your doctor may teach you how to inject yourself. Follow all instructions. Talk to your pediatrician regarding the use of this medicine in children. Special care may be needed. Overdosage: If you think you have taken too much of this medicine contact a poison control center or emergency room at once. NOTE: This medicine is only for you. Do not share this medicine with others. What if I miss a dose? If you are given your dose at a clinic or doctor's office, call to reschedule your appointment. If you give your own injections and you miss a dose, take it as soon as you can. If it is almost time for your next dose, take only that dose. Do not take double or extra doses. What may interact with this medicine? -colchicine -heavy alcohol intake This list may not describe all possible interactions. Give your health care provider a list of all the medicines, herbs, non-prescription drugs, or dietary supplements you use. Also tell them if you smoke, drink alcohol, or use illegal drugs. Some items may interact with your medicine. What should I watch for while using this medicine? Visit your doctor or health care professional regularly. You may need blood work done while you are taking this medicine. You may need to follow a special diet. Talk to your doctor. Limit your alcohol intake and avoid smoking to get the best benefit. What side effects may I notice from receiving this medicine? Side effects that you should report to your doctor or health care professional as soon as possible: -allergic reactions like skin rash, itching or  hives, swelling of the face, lips, or tongue -blue tint to skin -chest tightness, pain -difficulty breathing, wheezing -dizziness -red, swollen painful area on the leg Side effects that usually do not require medical attention (report to your doctor or health care professional if they continue or are bothersome): -diarrhea -headache This list may not describe all possible side effects. Call your doctor for medical advice about side effects. You may report side effects to FDA at 1-800-FDA-1088. Where should I keep my medicine? Keep out of the reach of children. Store at room temperature between 15 and 30 degrees C (59 and 85 degrees F). Protect from light. Throw away any unused medicine after the expiration date. NOTE: This sheet is a summary. It may not cover all possible information. If you have questions about this medicine, talk to your doctor, pharmacist, or health care provider.    2016, Elsevier/Gold Standard. (2007-05-25 22:10:20)  

## 2016-05-02 DIAGNOSIS — M1991 Primary osteoarthritis, unspecified site: Secondary | ICD-10-CM | POA: Diagnosis not present

## 2016-05-02 DIAGNOSIS — E1140 Type 2 diabetes mellitus with diabetic neuropathy, unspecified: Secondary | ICD-10-CM | POA: Diagnosis not present

## 2016-05-07 ENCOUNTER — Encounter: Payer: Self-pay | Admitting: Hematology

## 2016-05-07 ENCOUNTER — Other Ambulatory Visit (HOSPITAL_BASED_OUTPATIENT_CLINIC_OR_DEPARTMENT_OTHER): Payer: Medicare Other

## 2016-05-07 ENCOUNTER — Ambulatory Visit (HOSPITAL_BASED_OUTPATIENT_CLINIC_OR_DEPARTMENT_OTHER): Payer: Medicare Other | Admitting: Hematology

## 2016-05-07 ENCOUNTER — Ambulatory Visit (HOSPITAL_BASED_OUTPATIENT_CLINIC_OR_DEPARTMENT_OTHER): Payer: Medicare Other

## 2016-05-07 ENCOUNTER — Telehealth: Payer: Self-pay | Admitting: Hematology

## 2016-05-07 VITALS — BP 177/71 | HR 54 | Temp 97.5°F | Resp 18 | Wt 221.4 lb

## 2016-05-07 DIAGNOSIS — E119 Type 2 diabetes mellitus without complications: Secondary | ICD-10-CM | POA: Diagnosis not present

## 2016-05-07 DIAGNOSIS — N189 Chronic kidney disease, unspecified: Secondary | ICD-10-CM | POA: Diagnosis not present

## 2016-05-07 DIAGNOSIS — D693 Immune thrombocytopenic purpura: Secondary | ICD-10-CM

## 2016-05-07 DIAGNOSIS — I2510 Atherosclerotic heart disease of native coronary artery without angina pectoris: Secondary | ICD-10-CM

## 2016-05-07 DIAGNOSIS — M1991 Primary osteoarthritis, unspecified site: Secondary | ICD-10-CM | POA: Diagnosis not present

## 2016-05-07 DIAGNOSIS — E538 Deficiency of other specified B group vitamins: Secondary | ICD-10-CM

## 2016-05-07 DIAGNOSIS — E1140 Type 2 diabetes mellitus with diabetic neuropathy, unspecified: Secondary | ICD-10-CM | POA: Diagnosis not present

## 2016-05-07 DIAGNOSIS — Z95828 Presence of other vascular implants and grafts: Secondary | ICD-10-CM

## 2016-05-07 LAB — COMPREHENSIVE METABOLIC PANEL
ALBUMIN: 3.6 g/dL (ref 3.5–5.0)
ALK PHOS: 65 U/L (ref 40–150)
ALT: 21 U/L (ref 0–55)
ANION GAP: 8 meq/L (ref 3–11)
AST: 27 U/L (ref 5–34)
BILIRUBIN TOTAL: 0.55 mg/dL (ref 0.20–1.20)
BUN: 16 mg/dL (ref 7.0–26.0)
CO2: 24 mEq/L (ref 22–29)
Calcium: 9.5 mg/dL (ref 8.4–10.4)
Chloride: 109 mEq/L (ref 98–109)
Creatinine: 1.4 mg/dL — ABNORMAL HIGH (ref 0.7–1.3)
EGFR: 43 mL/min/{1.73_m2} — AB (ref >90)
Glucose: 163 mg/dl — ABNORMAL HIGH (ref 70–140)
POTASSIUM: 4.9 meq/L (ref 3.5–5.1)
SODIUM: 141 meq/L (ref 136–145)
TOTAL PROTEIN: 6.7 g/dL (ref 6.4–8.3)

## 2016-05-07 LAB — CBC & DIFF AND RETIC
BASO%: 0.3 % (ref 0.0–2.0)
Basophils Absolute: 0 10*3/uL (ref 0.0–0.1)
EOS%: 1 % (ref 0.0–7.0)
Eosinophils Absolute: 0.1 10*3/uL (ref 0.0–0.5)
HCT: 39.2 % (ref 38.4–49.9)
HEMOGLOBIN: 12.7 g/dL — AB (ref 13.0–17.1)
IMMATURE RETIC FRACT: 12.3 % — AB (ref 3.00–10.60)
LYMPH%: 25.2 % (ref 14.0–49.0)
MCH: 31.8 pg (ref 27.2–33.4)
MCHC: 32.4 g/dL (ref 32.0–36.0)
MCV: 98.2 fL — AB (ref 79.3–98.0)
MONO#: 0.9 10*3/uL (ref 0.1–0.9)
MONO%: 13.7 % (ref 0.0–14.0)
NEUT%: 59.8 % (ref 39.0–75.0)
NEUTROS ABS: 3.7 10*3/uL (ref 1.5–6.5)
PLATELETS: 186 10*3/uL (ref 140–400)
RBC: 3.99 10*6/uL — AB (ref 4.20–5.82)
RDW: 12.8 % (ref 11.0–14.6)
Retic %: 1.81 % — ABNORMAL HIGH (ref 0.80–1.80)
Retic Ct Abs: 72.22 10*3/uL (ref 34.80–93.90)
WBC: 6.3 10*3/uL (ref 4.0–10.3)
lymph#: 1.6 10*3/uL (ref 0.9–3.3)

## 2016-05-07 MED ORDER — ROMIPLOSTIM 250 MCG ~~LOC~~ SOLR
50.0000 ug | SUBCUTANEOUS | Status: AC
  Administered 2016-05-07: 50 ug via SUBCUTANEOUS
  Filled 2016-05-07: qty 0.1

## 2016-05-07 NOTE — Patient Instructions (Signed)
Romiplostim injection What is this medicine? ROMIPLOSTIM ([place] mi PLOE stim) helps your body make more platelets. This medicine is used to treat low platelets caused by chronic idiopathic thrombocytopenic purpura (ITP). This medicine may be used for other purposes; ask your health care provider or pharmacist if you have questions. What should I tell my health care provider before I take this medicine? They need to know if you have any of these conditions: -cancer or myelodysplastic syndrome -low blood counts, like low white cell, platelet, or red cell counts -take medicines that treat or prevent blood clots -an unusual or allergic reaction to romiplostim, mannitol, other medicines, foods, dyes, or preservatives -pregnant or trying to get pregnant -breast-feeding How should I use this medicine? This medicine is for injection under the skin. It is given by a health care professional in a hospital or clinic setting. A special MedGuide will be given to you before your injection. Read this information carefully each time. Talk to your pediatrician regarding the use of this medicine in children. Special care may be needed. Overdosage: If you think you have taken too much of this medicine contact a poison control center or emergency room at once. NOTE: This medicine is only for you. Do not share this medicine with others. What if I miss a dose? It is important not to miss your dose. Call your doctor or health care professional if you are unable to keep an appointment. What may interact with this medicine? Interactions are not expected. This list may not describe all possible interactions. Give your health care provider a list of all the medicines, herbs, non-prescription drugs, or dietary supplements you use. Also tell them if you smoke, drink alcohol, or use illegal drugs. Some items may interact with your medicine. What should I watch for while using this medicine? Your condition will be monitored  carefully while you are receiving this medicine. Visit your prescriber or health care professional for regular checks on your progress and for the needed blood tests. It is important to keep all appointments. What side effects may I notice from receiving this medicine? Side effects that you should report to your doctor or health care professional as soon as possible: -allergic reactions like skin rash, itching or hives, swelling of the face, lips, or tongue -shortness of breath, chest pain, swelling in a leg -unusual bleeding or bruising Side effects that usually do not require medical attention (report to your doctor or health care professional if they continue or are bothersome): -dizziness -headache -muscle aches -pain in arms and legs -stomach pain -trouble sleeping This list may not describe all possible side effects. Call your doctor for medical advice about side effects. You may report side effects to FDA at 1-800-FDA-1088. Where should I keep my medicine? This drug is given in a hospital or clinic and will not be stored at home. NOTE: This sheet is a summary. It may not cover all possible information. If you have questions about this medicine, talk to your doctor, pharmacist, or health care provider.    2016, Elsevier/Gold Standard. (2007-10-12 15:13:04) Cyanocobalamin, Vitamin B12 injection What is this medicine? CYANOCOBALAMIN (sye an oh koe BAL a min) is a man made form of vitamin B12. Vitamin B12 is used in the growth of healthy blood cells, nerve cells, and proteins in the body. It also helps with the metabolism of fats and carbohydrates. This medicine is used to treat people who can not absorb vitamin B12. This medicine may be used for other   purposes; ask your health care provider or pharmacist if you have questions. What should I tell my health care provider before I take this medicine? They need to know if you have any of these conditions: -kidney disease -Leber's  disease -megaloblastic anemia -an unusual or allergic reaction to cyanocobalamin, cobalt, other medicines, foods, dyes, or preservatives -pregnant or trying to get pregnant -breast-feeding How should I use this medicine? This medicine is injected into a muscle or deeply under the skin. It is usually given by a health care professional in a clinic or doctor's office. However, your doctor may teach you how to inject yourself. Follow all instructions. Talk to your pediatrician regarding the use of this medicine in children. Special care may be needed. Overdosage: If you think you have taken too much of this medicine contact a poison control center or emergency room at once. NOTE: This medicine is only for you. Do not share this medicine with others. What if I miss a dose? If you are given your dose at a clinic or doctor's office, call to reschedule your appointment. If you give your own injections and you miss a dose, take it as soon as you can. If it is almost time for your next dose, take only that dose. Do not take double or extra doses. What may interact with this medicine? -colchicine -heavy alcohol intake This list may not describe all possible interactions. Give your health care provider a list of all the medicines, herbs, non-prescription drugs, or dietary supplements you use. Also tell them if you smoke, drink alcohol, or use illegal drugs. Some items may interact with your medicine. What should I watch for while using this medicine? Visit your doctor or health care professional regularly. You may need blood work done while you are taking this medicine. You may need to follow a special diet. Talk to your doctor. Limit your alcohol intake and avoid smoking to get the best benefit. What side effects may I notice from receiving this medicine? Side effects that you should report to your doctor or health care professional as soon as possible: -allergic reactions like skin rash, itching or  hives, swelling of the face, lips, or tongue -blue tint to skin -chest tightness, pain -difficulty breathing, wheezing -dizziness -red, swollen painful area on the leg Side effects that usually do not require medical attention (report to your doctor or health care professional if they continue or are bothersome): -diarrhea -headache This list may not describe all possible side effects. Call your doctor for medical advice about side effects. You may report side effects to FDA at 1-800-FDA-1088. Where should I keep my medicine? Keep out of the reach of children. Store at room temperature between 15 and 30 degrees C (59 and 85 degrees F). Protect from light. Throw away any unused medicine after the expiration date. NOTE: This sheet is a summary. It may not cover all possible information. If you have questions about this medicine, talk to your doctor, pharmacist, or health care provider.    2016, Elsevier/Gold Standard. (2007-05-25 22:10:20)  

## 2016-05-07 NOTE — Telephone Encounter (Signed)
Appointments scheduled per 3/13 LOS. Patient left scheduling per prior engagement. He requested to have his schedule mailed out to him.   Patient very adamant that he will take no other appointment time other than 9:15AM. He has had people squeeze him in at 9:15AM or double book him at this time for over a year. Melissa D. Also spoke with the patient informing him that we are permitted to give him an appointment as close to the requested time as possible but we cannot double book our resources. All but two appointments will begin at 9:15 AM.

## 2016-05-09 ENCOUNTER — Ambulatory Visit: Payer: Medicare Other | Admitting: Internal Medicine

## 2016-05-14 ENCOUNTER — Ambulatory Visit (HOSPITAL_BASED_OUTPATIENT_CLINIC_OR_DEPARTMENT_OTHER): Payer: Medicare Other

## 2016-05-14 DIAGNOSIS — D693 Immune thrombocytopenic purpura: Secondary | ICD-10-CM

## 2016-05-14 DIAGNOSIS — Z95828 Presence of other vascular implants and grafts: Secondary | ICD-10-CM

## 2016-05-14 MED ORDER — ROMIPLOSTIM 250 MCG ~~LOC~~ SOLR
0.5000 ug/kg | SUBCUTANEOUS | Status: AC
  Administered 2016-05-14: 50 ug via SUBCUTANEOUS
  Filled 2016-05-14: qty 0.1

## 2016-05-14 MED ORDER — CYANOCOBALAMIN 1000 MCG/ML IJ SOLN
1000.0000 ug | INTRAMUSCULAR | Status: DC
  Administered 2016-05-14: 1000 ug via SUBCUTANEOUS

## 2016-05-14 NOTE — Patient Instructions (Signed)
Romiplostim injection What is this medicine? ROMIPLOSTIM ([place] mi PLOE stim) helps your body make more platelets. This medicine is used to treat low platelets caused by chronic idiopathic thrombocytopenic purpura (ITP). This medicine may be used for other purposes; ask your health care provider or pharmacist if you have questions. What should I tell my health care provider before I take this medicine? They need to know if you have any of these conditions: -cancer or myelodysplastic syndrome -low blood counts, like low white cell, platelet, or red cell counts -take medicines that treat or prevent blood clots -an unusual or allergic reaction to romiplostim, mannitol, other medicines, foods, dyes, or preservatives -pregnant or trying to get pregnant -breast-feeding How should I use this medicine? This medicine is for injection under the skin. It is given by a health care professional in a hospital or clinic setting. A special MedGuide will be given to you before your injection. Read this information carefully each time. Talk to your pediatrician regarding the use of this medicine in children. Special care may be needed. Overdosage: If you think you have taken too much of this medicine contact a poison control center or emergency room at once. NOTE: This medicine is only for you. Do not share this medicine with others. What if I miss a dose? It is important not to miss your dose. Call your doctor or health care professional if you are unable to keep an appointment. What may interact with this medicine? Interactions are not expected. This list may not describe all possible interactions. Give your health care provider a list of all the medicines, herbs, non-prescription drugs, or dietary supplements you use. Also tell them if you smoke, drink alcohol, or use illegal drugs. Some items may interact with your medicine. What should I watch for while using this medicine? Your condition will be monitored  carefully while you are receiving this medicine. Visit your prescriber or health care professional for regular checks on your progress and for the needed blood tests. It is important to keep all appointments. What side effects may I notice from receiving this medicine? Side effects that you should report to your doctor or health care professional as soon as possible: -allergic reactions like skin rash, itching or hives, swelling of the face, lips, or tongue -shortness of breath, chest pain, swelling in a leg -unusual bleeding or bruising Side effects that usually do not require medical attention (report to your doctor or health care professional if they continue or are bothersome): -dizziness -headache -muscle aches -pain in arms and legs -stomach pain -trouble sleeping This list may not describe all possible side effects. Call your doctor for medical advice about side effects. You may report side effects to FDA at 1-800-FDA-1088. Where should I keep my medicine? This drug is given in a hospital or clinic and will not be stored at home. NOTE: This sheet is a summary. It may not cover all possible information. If you have questions about this medicine, talk to your doctor, pharmacist, or health care provider.    2016, Elsevier/Gold Standard. (2007-10-12 15:13:04) Cyanocobalamin, Vitamin B12 injection What is this medicine? CYANOCOBALAMIN (sye an oh koe BAL a min) is a man made form of vitamin B12. Vitamin B12 is used in the growth of healthy blood cells, nerve cells, and proteins in the body. It also helps with the metabolism of fats and carbohydrates. This medicine is used to treat people who can not absorb vitamin B12. This medicine may be used for other   purposes; ask your health care provider or pharmacist if you have questions. What should I tell my health care provider before I take this medicine? They need to know if you have any of these conditions: -kidney disease -Leber's  disease -megaloblastic anemia -an unusual or allergic reaction to cyanocobalamin, cobalt, other medicines, foods, dyes, or preservatives -pregnant or trying to get pregnant -breast-feeding How should I use this medicine? This medicine is injected into a muscle or deeply under the skin. It is usually given by a health care professional in a clinic or doctor's office. However, your doctor may teach you how to inject yourself. Follow all instructions. Talk to your pediatrician regarding the use of this medicine in children. Special care may be needed. Overdosage: If you think you have taken too much of this medicine contact a poison control center or emergency room at once. NOTE: This medicine is only for you. Do not share this medicine with others. What if I miss a dose? If you are given your dose at a clinic or doctor's office, call to reschedule your appointment. If you give your own injections and you miss a dose, take it as soon as you can. If it is almost time for your next dose, take only that dose. Do not take double or extra doses. What may interact with this medicine? -colchicine -heavy alcohol intake This list may not describe all possible interactions. Give your health care provider a list of all the medicines, herbs, non-prescription drugs, or dietary supplements you use. Also tell them if you smoke, drink alcohol, or use illegal drugs. Some items may interact with your medicine. What should I watch for while using this medicine? Visit your doctor or health care professional regularly. You may need blood work done while you are taking this medicine. You may need to follow a special diet. Talk to your doctor. Limit your alcohol intake and avoid smoking to get the best benefit. What side effects may I notice from receiving this medicine? Side effects that you should report to your doctor or health care professional as soon as possible: -allergic reactions like skin rash, itching or  hives, swelling of the face, lips, or tongue -blue tint to skin -chest tightness, pain -difficulty breathing, wheezing -dizziness -red, swollen painful area on the leg Side effects that usually do not require medical attention (report to your doctor or health care professional if they continue or are bothersome): -diarrhea -headache This list may not describe all possible side effects. Call your doctor for medical advice about side effects. You may report side effects to FDA at 1-800-FDA-1088. Where should I keep my medicine? Keep out of the reach of children. Store at room temperature between 15 and 30 degrees C (59 and 85 degrees F). Protect from light. Throw away any unused medicine after the expiration date. NOTE: This sheet is a summary. It may not cover all possible information. If you have questions about this medicine, talk to your doctor, pharmacist, or health care provider.    2016, Elsevier/Gold Standard. (2007-05-25 22:10:20)  

## 2016-05-16 DIAGNOSIS — D696 Thrombocytopenia, unspecified: Secondary | ICD-10-CM | POA: Diagnosis not present

## 2016-05-16 DIAGNOSIS — N183 Chronic kidney disease, stage 3 (moderate): Secondary | ICD-10-CM | POA: Diagnosis not present

## 2016-05-16 DIAGNOSIS — E119 Type 2 diabetes mellitus without complications: Secondary | ICD-10-CM | POA: Diagnosis not present

## 2016-05-16 DIAGNOSIS — I10 Essential (primary) hypertension: Secondary | ICD-10-CM | POA: Diagnosis not present

## 2016-05-16 DIAGNOSIS — I5022 Chronic systolic (congestive) heart failure: Secondary | ICD-10-CM | POA: Diagnosis not present

## 2016-05-19 NOTE — Progress Notes (Signed)
.    Hematology oncology clinic note  Date of service: .05/07/2016  Patient Care Team: Hoyt Koch, MD as PCP - General (Internal Medicine)  CHIEF COMPLAINTS/PURPOSE OF CONSULTATION: Follow-up for ITP   Diagnosis: Idiopathic thrombocytopenic purpura   Current treatment: Nplate weekly to maintain reasonable platelet counts >50K. Requirement have ranged from 41mcg/kg to 40mcg/kg. Recently stable counts with 0.5-2 mcg/kg  Previous treatment: Steroids, IVIG.  HISTORY OF PRESENTING ILLNESS:  please see my previous clinic note from 09/28/2014 for details of initial presentation and course of treatment.  Interval history  Patient is here for follow-up regarding his ITP. He notes no acute new concerns. No bleeding. Platelet count is stable at 186k  MEDICAL HISTORY:  Past Medical History:  Diagnosis Date  . BPH (benign prostatic hypertrophy)   . Chronic renal insufficiency   . Coronary artery disease    CABG in 2000  . Diabetes type 2, controlled ([place])    Diet-controlled  . Diabetic neuropathy ([place])   . Dyslipidemia   . Hypertension   . ITP (idiopathic thrombocytopenic purpura)   . Osteoarthritis     SURGICAL HISTORY: Past Surgical History:  Procedure Laterality Date  . CHOLECYSTECTOMY    . CORONARY ARTERY BYPASS GRAFT  2000  . Right hip replacement  2014  . TONSILLECTOMY      SOCIAL HISTORY: Social History   Social History  . Marital status: Single    Spouse name: N/A  . Number of children: N/A  . Years of education: N/A   Occupational History  . Salesman    Social History Main Topics  . Smoking status: Former Smoker    Packs/day: 2.00    Years: 25.00  . Smokeless tobacco: Never Used  . Alcohol use No  . Drug use: No  . Sexual activity: Not Currently   Other Topics Concern  . Not on file   Social History Narrative    Two children.     He is living in [place] which is an assisted living facility. One of his son is also moving to  [facility] and has a Designer, jewellery in religious studies. Patient notes that his other son is a Systems analyst with multiple awards.  FAMILY HISTORY: Family History  Problem Relation Age of Onset  . Lung disease Father   . Cancer Brother     ALLERGIES:  is allergic to alfuzosin hcl er and penicillins.  MEDICATIONS:  Current Outpatient Prescriptions  Medication Sig Dispense Refill  . acetaminophen (TYLENOL) 500 MG tablet Take 250-1,000 mg by mouth every 6 (six) hours as needed for mild pain, moderate pain or headache.     . Ascorbic Acid (VITAMIN C) 100 MG tablet Take 100 mg by mouth daily.     . cholecalciferol (VITAMIN D) 1000 units tablet Take 1,000 Units by mouth daily.    . magnesium chloride (SLOW-MAG) 64 MG TBEC SR tablet Take 1 tablet (64 mg total) by mouth daily. 60 tablet 1  . metoprolol tartrate (LOPRESSOR) 25 MG tablet Take 0.5 tablets (12.5 mg total) by mouth 2 (two) times daily. 90 tablet 0  . omeprazole (PRILOSEC) 20 MG capsule Take 20 mg by mouth daily.    . simvastatin (ZOCOR) 20 MG tablet Take 20 mg by mouth at bedtime.     . nitroGLYCERIN (NITROSTAT) 0.4 MG SL tablet Place 1 tablet (0.4 mg total) under the tongue every 5 (five) minutes as needed for chest pain. 25 tablet 3   No current facility-administered medications  for this visit.    Facility-Administered Medications Ordered in Other Visits  Medication Dose Route Frequency Provider Last Rate Last Dose  . cyanocobalamin ((VITAMIN B-12)) injection 1,000 mcg  1,000 mcg Subcutaneous Q30 days [name], MD   1,000 mcg at 03/13/16 1348  . cyanocobalamin ((VITAMIN B-12)) injection 1,000 mcg  1,000 mcg Subcutaneous Q30 days [name], MD   1,000 mcg at 05/14/16 1001  . romiPLOStim (NPLATE) injection 50 mcg  0.5 mcg/kg Subcutaneous Weekly [name], MD   50 mcg at 03/13/16 1349  . romiPLOStim (NPLATE) injection 50 mcg  50 mcg Subcutaneous Weekly [name], MD   50 mcg at 05/07/16  1106  . romiPLOStim (NPLATE) injection 50 mcg  0.5 mcg/kg Subcutaneous Weekly [name], MD   50 mcg at 05/14/16 1001   Romiplostim 5 mcg/kg weekly   REVIEW OF SYSTEMS:   10 point review of system is negative except as noted above  PHYSICAL EXAMINATION: ECOG PERFORMANCE STATUS: 1 - Symptomatic but completely ambulatory  Vitals:   05/07/16 1032  BP: (!) 177/71  Pulse: (!) 54  Resp: 18  Temp: 97.5 F (36.4 C)   Filed Weights   05/07/16 1032  Weight: 221 lb 6.4 oz (100.4 kg)   GENERAL: Elderly gentleman in no acute distress:alert and comfortable SKIN: skin color, texture, turgor are normal, no rashes or significant lesions EYES: normal, conjunctiva are pink and non-injected, sclera clear OROPHARYNX:no exudate, no erythema and lips, buccal mucosa, and tongue normal  NECK: supple, thyroid normal size, non-tender, without nodularity LYMPH:  no palpable lymphadenopathy in the cervical, axillary or inguinal LUNGS: Air entry bilaterally equal, No rales no rhonchi HEART: regular rate & rhythm and 2 x 6 systolic murmur over aortic area, and no lower extremity edema ABDOMEN:abdomen soft, non-tender and normal bowel sounds PSYCH: alert & oriented x 3 with fluent speech NEURO: no focal motor/sensory deficits  LABORATORY DATA:  . CBC Latest Ref Rng & Units 05/07/2016 04/23/2016 04/09/2016  WBC 4.0 - 10.3 10e3/uL 6.3 5.8 5.7  Hemoglobin 13.0 - 17.1 g/dL 12.7(L) 12.8(L) 11.9(L)  Hematocrit 38.4 - 49.9 % 39.2 39.1 36.9(L)  Platelets 140 - 400 10e3/uL 186 199 188    CMP Latest Ref Rng & Units 05/07/2016 04/09/2016 03/13/2016  Glucose 70 - 140 mg/dl 163(H) 148(H) 134  BUN 7.0 - 26.0 mg/dL 16.0 14.0 26.2(H)  Creatinine 0.7 - 1.3 mg/dL 1.4(H) 1.3 1.5(H)  Sodium 136 - 145 mEq/L 141 142 142  Potassium 3.5 - 5.1 mEq/L 4.9 4.8 4.6  Chloride 101 - 111 mmol/L - - -  CO2 22 - 29 mEq/L 24 25 22   Calcium 8.4 - 10.4 mg/dL 9.5 9.6 9.0  Total Protein 6.4 - 8.3 g/dL 6.7 6.4 5.9(L)  Total  Bilirubin 0.20 - 1.20 mg/dL 0.55 0.53 0.50  Alkaline Phos 40 - 150 U/L 65 72 71  AST 5 - 34 U/L 27 27 25   ALT 0 - 55 U/L 21 23 28     ASSESSMENT & PLAN:   83 year old Caucasian male with multiple medical comorbidities with  #1 chronic ITP since 1989. Has previously been responsive to steroids and has received IVIG on one occasion. He has been maintained on 3-60mcg/kg weekly of Nplate since mid 6720 initially with Dr. Arvin Collard at [facility] and then with Dr. Jimmie Molly at [facility] in [place].  No issues with bleeding . Ultrasound abdomen showed normal spleen size SPEP- no obvious monoclonal protein. IFE showed possibility of an  restrictive bands in the IgG and Lambda lanes. Patient's platelet counts have remained remarkably stable at Romiplostim doses of 23mcg/kg and was subsequently cut down to 0.5 mcg/kg and platelets have remained stable with this dose as well.  Platelets are stable today despite his recent flu and pneumonia. Are currently at 186k Plan  -continue Nplate at 8.5BMZ/TA weekly -cbc 2 weeks and if that is stable might be able to switch him to monthly cbc -continue B12 replacement monthly for B12 deficiency. -Avoid NSAIDs  #2  Coronary artery disease status post CABG in year 2000. Not on aspirin due to his ITP . BNP was elevated. ECHO today shows mildly reduced systolic function and LVH  No chest pain. No shortness of breath. #3 chronic kidney disease  -Continue to management as per primary care physician  #4 Recently hospitalization for influenza with pneumonia -- now recovering at rehab facility.  -continue weekly Nplate -cbc [id number] if stable for 2 months may switch to q4week labs RTC with Dr Irene Limbo in 3 months  Total time spent 20 minutes more than 50% time on direct patient contact counseling and coordination of care.    Sullivan Lone MD [place] Hematology/Oncology Physician [facility]  (Office):       [phone] (Work cell):   [phone] (Fax):           [phone]

## 2016-05-21 ENCOUNTER — Other Ambulatory Visit (HOSPITAL_BASED_OUTPATIENT_CLINIC_OR_DEPARTMENT_OTHER): Payer: Medicare Other

## 2016-05-21 ENCOUNTER — Ambulatory Visit (HOSPITAL_BASED_OUTPATIENT_CLINIC_OR_DEPARTMENT_OTHER): Payer: Medicare Other

## 2016-05-21 VITALS — BP 160/78 | HR 67 | Temp 97.6°F | Resp 22

## 2016-05-21 DIAGNOSIS — Z95828 Presence of other vascular implants and grafts: Secondary | ICD-10-CM

## 2016-05-21 DIAGNOSIS — D693 Immune thrombocytopenic purpura: Secondary | ICD-10-CM

## 2016-05-21 LAB — CBC & DIFF AND RETIC
BASO%: 0.4 % (ref 0.0–2.0)
BASOS ABS: 0 10*3/uL (ref 0.0–0.1)
EOS ABS: 0.1 10*3/uL (ref 0.0–0.5)
EOS%: 1.1 % (ref 0.0–7.0)
HEMATOCRIT: 38.7 % (ref 38.4–49.9)
HEMOGLOBIN: 12.9 g/dL — AB (ref 13.0–17.1)
IMMATURE RETIC FRACT: 7 % (ref 3.00–10.60)
LYMPH%: 19.2 % (ref 14.0–49.0)
MCH: 32.1 pg (ref 27.2–33.4)
MCHC: 33.3 g/dL (ref 32.0–36.0)
MCV: 96.3 fL (ref 79.3–98.0)
MONO#: 0.9 10*3/uL (ref 0.1–0.9)
MONO%: 11.9 % (ref 0.0–14.0)
NEUT#: 4.9 10*3/uL (ref 1.5–6.5)
NEUT%: 67.4 % (ref 39.0–75.0)
NRBC: 0 % (ref 0–0)
Platelets: 162 10*3/uL (ref 140–400)
RBC: 4.02 10*6/uL — ABNORMAL LOW (ref 4.20–5.82)
RDW: 12.7 % (ref 11.0–14.6)
Retic %: 1.74 % (ref 0.80–1.80)
Retic Ct Abs: 69.95 10*3/uL (ref 34.80–93.90)
WBC: 7.2 10*3/uL (ref 4.0–10.3)
lymph#: 1.4 10*3/uL (ref 0.9–3.3)

## 2016-05-21 MED ORDER — ROMIPLOSTIM 250 MCG ~~LOC~~ SOLR
0.5000 ug/kg | SUBCUTANEOUS | Status: DC
Start: 2016-05-21 — End: 2016-05-21
  Administered 2016-05-21: 50 ug via SUBCUTANEOUS
  Filled 2016-05-21: qty 0.1

## 2016-05-21 NOTE — Patient Instructions (Signed)
Romiplostim injection What is this medicine? ROMIPLOSTIM ([place] mi PLOE stim) helps your body make more platelets. This medicine is used to treat low platelets caused by chronic idiopathic thrombocytopenic purpura (ITP). This medicine may be used for other purposes; ask your health care provider or pharmacist if you have questions. What should I tell my health care provider before I take this medicine? They need to know if you have any of these conditions: -cancer or myelodysplastic syndrome -low blood counts, like low white cell, platelet, or red cell counts -take medicines that treat or prevent blood clots -an unusual or allergic reaction to romiplostim, mannitol, other medicines, foods, dyes, or preservatives -pregnant or trying to get pregnant -breast-feeding How should I use this medicine? This medicine is for injection under the skin. It is given by a health care professional in a hospital or clinic setting. A special MedGuide will be given to you before your injection. Read this information carefully each time. Talk to your pediatrician regarding the use of this medicine in children. Special care may be needed. Overdosage: If you think you have taken too much of this medicine contact a poison control center or emergency room at once. NOTE: This medicine is only for you. Do not share this medicine with others. What if I miss a dose? It is important not to miss your dose. Call your doctor or health care professional if you are unable to keep an appointment. What may interact with this medicine? Interactions are not expected. This list may not describe all possible interactions. Give your health care provider a list of all the medicines, herbs, non-prescription drugs, or dietary supplements you use. Also tell them if you smoke, drink alcohol, or use illegal drugs. Some items may interact with your medicine. What should I watch for while using this medicine? Your condition will be monitored  carefully while you are receiving this medicine. Visit your prescriber or health care professional for regular checks on your progress and for the needed blood tests. It is important to keep all appointments. What side effects may I notice from receiving this medicine? Side effects that you should report to your doctor or health care professional as soon as possible: -allergic reactions like skin rash, itching or hives, swelling of the face, lips, or tongue -shortness of breath, chest pain, swelling in a leg -unusual bleeding or bruising Side effects that usually do not require medical attention (report to your doctor or health care professional if they continue or are bothersome): -dizziness -headache -muscle aches -pain in arms and legs -stomach pain -trouble sleeping This list may not describe all possible side effects. Call your doctor for medical advice about side effects. You may report side effects to FDA at 1-800-FDA-1088. Where should I keep my medicine? This drug is given in a hospital or clinic and will not be stored at home. NOTE: This sheet is a summary. It may not cover all possible information. If you have questions about this medicine, talk to your doctor, pharmacist, or health care provider.    2016, Elsevier/Gold Standard. (2007-10-12 15:13:04) Cyanocobalamin, Vitamin B12 injection What is this medicine? CYANOCOBALAMIN (sye an oh koe BAL a min) is a man made form of vitamin B12. Vitamin B12 is used in the growth of healthy blood cells, nerve cells, and proteins in the body. It also helps with the metabolism of fats and carbohydrates. This medicine is used to treat people who can not absorb vitamin B12. This medicine may be used for other   purposes; ask your health care provider or pharmacist if you have questions. What should I tell my health care provider before I take this medicine? They need to know if you have any of these conditions: -kidney disease -Leber's  disease -megaloblastic anemia -an unusual or allergic reaction to cyanocobalamin, cobalt, other medicines, foods, dyes, or preservatives -pregnant or trying to get pregnant -breast-feeding How should I use this medicine? This medicine is injected into a muscle or deeply under the skin. It is usually given by a health care professional in a clinic or doctor's office. However, your doctor may teach you how to inject yourself. Follow all instructions. Talk to your pediatrician regarding the use of this medicine in children. Special care may be needed. Overdosage: If you think you have taken too much of this medicine contact a poison control center or emergency room at once. NOTE: This medicine is only for you. Do not share this medicine with others. What if I miss a dose? If you are given your dose at a clinic or doctor's office, call to reschedule your appointment. If you give your own injections and you miss a dose, take it as soon as you can. If it is almost time for your next dose, take only that dose. Do not take double or extra doses. What may interact with this medicine? -colchicine -heavy alcohol intake This list may not describe all possible interactions. Give your health care provider a list of all the medicines, herbs, non-prescription drugs, or dietary supplements you use. Also tell them if you smoke, drink alcohol, or use illegal drugs. Some items may interact with your medicine. What should I watch for while using this medicine? Visit your doctor or health care professional regularly. You may need blood work done while you are taking this medicine. You may need to follow a special diet. Talk to your doctor. Limit your alcohol intake and avoid smoking to get the best benefit. What side effects may I notice from receiving this medicine? Side effects that you should report to your doctor or health care professional as soon as possible: -allergic reactions like skin rash, itching or  hives, swelling of the face, lips, or tongue -blue tint to skin -chest tightness, pain -difficulty breathing, wheezing -dizziness -red, swollen painful area on the leg Side effects that usually do not require medical attention (report to your doctor or health care professional if they continue or are bothersome): -diarrhea -headache This list may not describe all possible side effects. Call your doctor for medical advice about side effects. You may report side effects to FDA at 1-800-FDA-1088. Where should I keep my medicine? Keep out of the reach of children. Store at room temperature between 15 and 30 degrees C (59 and 85 degrees F). Protect from light. Throw away any unused medicine after the expiration date. NOTE: This sheet is a summary. It may not cover all possible information. If you have questions about this medicine, talk to your doctor, pharmacist, or health care provider.    2016, Elsevier/Gold Standard. (2007-05-25 22:10:20)  

## 2016-05-28 ENCOUNTER — Ambulatory Visit (HOSPITAL_BASED_OUTPATIENT_CLINIC_OR_DEPARTMENT_OTHER): Payer: Medicare Other

## 2016-05-28 VITALS — BP 170/66 | HR 65 | Temp 97.9°F | Resp 18

## 2016-05-28 DIAGNOSIS — Z95828 Presence of other vascular implants and grafts: Secondary | ICD-10-CM

## 2016-05-28 DIAGNOSIS — D693 Immune thrombocytopenic purpura: Secondary | ICD-10-CM | POA: Diagnosis present

## 2016-05-28 MED ORDER — ROMIPLOSTIM 250 MCG ~~LOC~~ SOLR
50.0000 ug | SUBCUTANEOUS | Status: DC
  Administered 2016-05-28: 50 ug via SUBCUTANEOUS
  Filled 2016-05-28: qty 0.1

## 2016-05-28 NOTE — Patient Instructions (Signed)
Romiplostim injection What is this medicine? ROMIPLOSTIM ([place] mi PLOE stim) helps your body make more platelets. This medicine is used to treat low platelets caused by chronic idiopathic thrombocytopenic purpura (ITP). This medicine may be used for other purposes; ask your health care provider or pharmacist if you have questions. What should I tell my health care provider before I take this medicine? They need to know if you have any of these conditions: -cancer or myelodysplastic syndrome -low blood counts, like low white cell, platelet, or red cell counts -take medicines that treat or prevent blood clots -an unusual or allergic reaction to romiplostim, mannitol, other medicines, foods, dyes, or preservatives -pregnant or trying to get pregnant -breast-feeding How should I use this medicine? This medicine is for injection under the skin. It is given by a health care professional in a hospital or clinic setting. A special MedGuide will be given to you before your injection. Read this information carefully each time. Talk to your pediatrician regarding the use of this medicine in children. Special care may be needed. Overdosage: If you think you have taken too much of this medicine contact a poison control center or emergency room at once. NOTE: This medicine is only for you. Do not share this medicine with others. What if I miss a dose? It is important not to miss your dose. Call your doctor or health care professional if you are unable to keep an appointment. What may interact with this medicine? Interactions are not expected. This list may not describe all possible interactions. Give your health care provider a list of all the medicines, herbs, non-prescription drugs, or dietary supplements you use. Also tell them if you smoke, drink alcohol, or use illegal drugs. Some items may interact with your medicine. What should I watch for while using this medicine? Your condition will be monitored  carefully while you are receiving this medicine. Visit your prescriber or health care professional for regular checks on your progress and for the needed blood tests. It is important to keep all appointments. What side effects may I notice from receiving this medicine? Side effects that you should report to your doctor or health care professional as soon as possible: -allergic reactions like skin rash, itching or hives, swelling of the face, lips, or tongue -shortness of breath, chest pain, swelling in a leg -unusual bleeding or bruising Side effects that usually do not require medical attention (report to your doctor or health care professional if they continue or are bothersome): -dizziness -headache -muscle aches -pain in arms and legs -stomach pain -trouble sleeping This list may not describe all possible side effects. Call your doctor for medical advice about side effects. You may report side effects to FDA at 1-800-FDA-1088. Where should I keep my medicine? This drug is given in a hospital or clinic and will not be stored at home. NOTE: This sheet is a summary. It may not cover all possible information. If you have questions about this medicine, talk to your doctor, pharmacist, or health care provider.    2016, Elsevier/Gold Standard. (2007-10-12 15:13:04) Cyanocobalamin, Vitamin B12 injection What is this medicine? CYANOCOBALAMIN (sye an oh koe BAL a min) is a man made form of vitamin B12. Vitamin B12 is used in the growth of healthy blood cells, nerve cells, and proteins in the body. It also helps with the metabolism of fats and carbohydrates. This medicine is used to treat people who can not absorb vitamin B12. This medicine may be used for other   purposes; ask your health care provider or pharmacist if you have questions. What should I tell my health care provider before I take this medicine? They need to know if you have any of these conditions: -kidney disease -Leber's  disease -megaloblastic anemia -an unusual or allergic reaction to cyanocobalamin, cobalt, other medicines, foods, dyes, or preservatives -pregnant or trying to get pregnant -breast-feeding How should I use this medicine? This medicine is injected into a muscle or deeply under the skin. It is usually given by a health care professional in a clinic or doctor's office. However, your doctor may teach you how to inject yourself. Follow all instructions. Talk to your pediatrician regarding the use of this medicine in children. Special care may be needed. Overdosage: If you think you have taken too much of this medicine contact a poison control center or emergency room at once. NOTE: This medicine is only for you. Do not share this medicine with others. What if I miss a dose? If you are given your dose at a clinic or doctor's office, call to reschedule your appointment. If you give your own injections and you miss a dose, take it as soon as you can. If it is almost time for your next dose, take only that dose. Do not take double or extra doses. What may interact with this medicine? -colchicine -heavy alcohol intake This list may not describe all possible interactions. Give your health care provider a list of all the medicines, herbs, non-prescription drugs, or dietary supplements you use. Also tell them if you smoke, drink alcohol, or use illegal drugs. Some items may interact with your medicine. What should I watch for while using this medicine? Visit your doctor or health care professional regularly. You may need blood work done while you are taking this medicine. You may need to follow a special diet. Talk to your doctor. Limit your alcohol intake and avoid smoking to get the best benefit. What side effects may I notice from receiving this medicine? Side effects that you should report to your doctor or health care professional as soon as possible: -allergic reactions like skin rash, itching or  hives, swelling of the face, lips, or tongue -blue tint to skin -chest tightness, pain -difficulty breathing, wheezing -dizziness -red, swollen painful area on the leg Side effects that usually do not require medical attention (report to your doctor or health care professional if they continue or are bothersome): -diarrhea -headache This list may not describe all possible side effects. Call your doctor for medical advice about side effects. You may report side effects to FDA at 1-800-FDA-1088. Where should I keep my medicine? Keep out of the reach of children. Store at room temperature between 15 and 30 degrees C (59 and 85 degrees F). Protect from light. Throw away any unused medicine after the expiration date. NOTE: This sheet is a summary. It may not cover all possible information. If you have questions about this medicine, talk to your doctor, pharmacist, or health care provider.    2016, Elsevier/Gold Standard. (2007-05-25 22:10:20)  

## 2016-06-04 ENCOUNTER — Ambulatory Visit (HOSPITAL_BASED_OUTPATIENT_CLINIC_OR_DEPARTMENT_OTHER): Payer: Medicare Other

## 2016-06-04 ENCOUNTER — Other Ambulatory Visit (HOSPITAL_BASED_OUTPATIENT_CLINIC_OR_DEPARTMENT_OTHER): Payer: Medicare Other

## 2016-06-04 VITALS — BP 167/57 | HR 62 | Temp 97.7°F | Resp 22

## 2016-06-04 DIAGNOSIS — D693 Immune thrombocytopenic purpura: Secondary | ICD-10-CM

## 2016-06-04 DIAGNOSIS — Z95828 Presence of other vascular implants and grafts: Secondary | ICD-10-CM

## 2016-06-04 LAB — CBC & DIFF AND RETIC
BASO%: 0.1 % (ref 0.0–2.0)
Basophils Absolute: 0 10*3/uL (ref 0.0–0.1)
EOS ABS: 0.1 10*3/uL (ref 0.0–0.5)
EOS%: 1.1 % (ref 0.0–7.0)
HCT: 39.4 % (ref 38.4–49.9)
HEMOGLOBIN: 12.9 g/dL — AB (ref 13.0–17.1)
Immature Retic Fract: 11 % — ABNORMAL HIGH (ref 3.00–10.60)
LYMPH#: 1.4 10*3/uL (ref 0.9–3.3)
LYMPH%: 18.5 % (ref 14.0–49.0)
MCH: 31.6 pg (ref 27.2–33.4)
MCHC: 32.7 g/dL (ref 32.0–36.0)
MCV: 96.6 fL (ref 79.3–98.0)
MONO#: 0.7 10*3/uL (ref 0.1–0.9)
MONO%: 8.8 % (ref 0.0–14.0)
NEUT%: 71.5 % (ref 39.0–75.0)
NEUTROS ABS: 5.3 10*3/uL (ref 1.5–6.5)
PLATELETS: 215 10*3/uL (ref 140–400)
RBC: 4.08 10*6/uL — AB (ref 4.20–5.82)
RDW: 12.3 % (ref 11.0–14.6)
RETIC CT ABS: 62.02 10*3/uL (ref 34.80–93.90)
Retic %: 1.52 % (ref 0.80–1.80)
WBC: 7.4 10*3/uL (ref 4.0–10.3)

## 2016-06-04 LAB — COMPREHENSIVE METABOLIC PANEL
ALBUMIN: 3.5 g/dL (ref 3.5–5.0)
ALK PHOS: 75 U/L (ref 40–150)
ALT: 14 U/L (ref 0–55)
AST: 20 U/L (ref 5–34)
Anion Gap: 9 mEq/L (ref 3–11)
BILIRUBIN TOTAL: 0.46 mg/dL (ref 0.20–1.20)
BUN: 21.3 mg/dL (ref 7.0–26.0)
CO2: 25 meq/L (ref 22–29)
CREATININE: 1.4 mg/dL — AB (ref 0.7–1.3)
Calcium: 9.7 mg/dL (ref 8.4–10.4)
Chloride: 106 mEq/L (ref 98–109)
EGFR: 41 mL/min/{1.73_m2} — AB (ref >90)
GLUCOSE: 176 mg/dL — AB (ref 70–140)
Potassium: 4.6 mEq/L (ref 3.5–5.1)
SODIUM: 139 meq/L (ref 136–145)
TOTAL PROTEIN: 6.8 g/dL (ref 6.4–8.3)

## 2016-06-04 MED ORDER — ROMIPLOSTIM 250 MCG ~~LOC~~ SOLR
50.0000 ug | SUBCUTANEOUS | Status: DC
  Administered 2016-06-04: 50 ug via SUBCUTANEOUS
  Filled 2016-06-04: qty 0.1

## 2016-06-04 NOTE — Patient Instructions (Signed)
Romiplostim injection What is this medicine? ROMIPLOSTIM ([place] mi PLOE stim) helps your body make more platelets. This medicine is used to treat low platelets caused by chronic idiopathic thrombocytopenic purpura (ITP). This medicine may be used for other purposes; ask your health care provider or pharmacist if you have questions. What should I tell my health care provider before I take this medicine? They need to know if you have any of these conditions: -cancer or myelodysplastic syndrome -low blood counts, like low white cell, platelet, or red cell counts -take medicines that treat or prevent blood clots -an unusual or allergic reaction to romiplostim, mannitol, other medicines, foods, dyes, or preservatives -pregnant or trying to get pregnant -breast-feeding How should I use this medicine? This medicine is for injection under the skin. It is given by a health care professional in a hospital or clinic setting. A special MedGuide will be given to you before your injection. Read this information carefully each time. Talk to your pediatrician regarding the use of this medicine in children. Special care may be needed. Overdosage: If you think you have taken too much of this medicine contact a poison control center or emergency room at once. NOTE: This medicine is only for you. Do not share this medicine with others. What if I miss a dose? It is important not to miss your dose. Call your doctor or health care professional if you are unable to keep an appointment. What may interact with this medicine? Interactions are not expected. This list may not describe all possible interactions. Give your health care provider a list of all the medicines, herbs, non-prescription drugs, or dietary supplements you use. Also tell them if you smoke, drink alcohol, or use illegal drugs. Some items may interact with your medicine. What should I watch for while using this medicine? Your condition will be monitored  carefully while you are receiving this medicine. Visit your prescriber or health care professional for regular checks on your progress and for the needed blood tests. It is important to keep all appointments. What side effects may I notice from receiving this medicine? Side effects that you should report to your doctor or health care professional as soon as possible: -allergic reactions like skin rash, itching or hives, swelling of the face, lips, or tongue -shortness of breath, chest pain, swelling in a leg -unusual bleeding or bruising Side effects that usually do not require medical attention (report to your doctor or health care professional if they continue or are bothersome): -dizziness -headache -muscle aches -pain in arms and legs -stomach pain -trouble sleeping This list may not describe all possible side effects. Call your doctor for medical advice about side effects. You may report side effects to FDA at 1-800-FDA-1088. Where should I keep my medicine? This drug is given in a hospital or clinic and will not be stored at home. NOTE: This sheet is a summary. It may not cover all possible information. If you have questions about this medicine, talk to your doctor, pharmacist, or health care provider.    2016, Elsevier/Gold Standard. (2007-10-12 15:13:04) Cyanocobalamin, Vitamin B12 injection What is this medicine? CYANOCOBALAMIN (sye an oh koe BAL a min) is a man made form of vitamin B12. Vitamin B12 is used in the growth of healthy blood cells, nerve cells, and proteins in the body. It also helps with the metabolism of fats and carbohydrates. This medicine is used to treat people who can not absorb vitamin B12. This medicine may be used for other   purposes; ask your health care provider or pharmacist if you have questions. What should I tell my health care provider before I take this medicine? They need to know if you have any of these conditions: -kidney disease -Leber's  disease -megaloblastic anemia -an unusual or allergic reaction to cyanocobalamin, cobalt, other medicines, foods, dyes, or preservatives -pregnant or trying to get pregnant -breast-feeding How should I use this medicine? This medicine is injected into a muscle or deeply under the skin. It is usually given by a health care professional in a clinic or doctor's office. However, your doctor may teach you how to inject yourself. Follow all instructions. Talk to your pediatrician regarding the use of this medicine in children. Special care may be needed. Overdosage: If you think you have taken too much of this medicine contact a poison control center or emergency room at once. NOTE: This medicine is only for you. Do not share this medicine with others. What if I miss a dose? If you are given your dose at a clinic or doctor's office, call to reschedule your appointment. If you give your own injections and you miss a dose, take it as soon as you can. If it is almost time for your next dose, take only that dose. Do not take double or extra doses. What may interact with this medicine? -colchicine -heavy alcohol intake This list may not describe all possible interactions. Give your health care provider a list of all the medicines, herbs, non-prescription drugs, or dietary supplements you use. Also tell them if you smoke, drink alcohol, or use illegal drugs. Some items may interact with your medicine. What should I watch for while using this medicine? Visit your doctor or health care professional regularly. You may need blood work done while you are taking this medicine. You may need to follow a special diet. Talk to your doctor. Limit your alcohol intake and avoid smoking to get the best benefit. What side effects may I notice from receiving this medicine? Side effects that you should report to your doctor or health care professional as soon as possible: -allergic reactions like skin rash, itching or  hives, swelling of the face, lips, or tongue -blue tint to skin -chest tightness, pain -difficulty breathing, wheezing -dizziness -red, swollen painful area on the leg Side effects that usually do not require medical attention (report to your doctor or health care professional if they continue or are bothersome): -diarrhea -headache This list may not describe all possible side effects. Call your doctor for medical advice about side effects. You may report side effects to FDA at 1-800-FDA-1088. Where should I keep my medicine? Keep out of the reach of children. Store at room temperature between 15 and 30 degrees C (59 and 85 degrees F). Protect from light. Throw away any unused medicine after the expiration date. NOTE: This sheet is a summary. It may not cover all possible information. If you have questions about this medicine, talk to your doctor, pharmacist, or health care provider.    2016, Elsevier/Gold Standard. (2007-05-25 22:10:20)  

## 2016-06-06 DIAGNOSIS — L570 Actinic keratosis: Secondary | ICD-10-CM | POA: Diagnosis not present

## 2016-06-11 ENCOUNTER — Ambulatory Visit (HOSPITAL_BASED_OUTPATIENT_CLINIC_OR_DEPARTMENT_OTHER): Payer: Medicare Other

## 2016-06-11 VITALS — BP 160/58 | HR 54 | Temp 97.2°F | Resp 20

## 2016-06-11 DIAGNOSIS — D693 Immune thrombocytopenic purpura: Secondary | ICD-10-CM

## 2016-06-11 DIAGNOSIS — Z95828 Presence of other vascular implants and grafts: Secondary | ICD-10-CM

## 2016-06-11 MED ORDER — ROMIPLOSTIM 250 MCG ~~LOC~~ SOLR
50.0000 ug | SUBCUTANEOUS | Status: DC
  Administered 2016-06-11: 50 ug via SUBCUTANEOUS
  Filled 2016-06-11: qty 0.1

## 2016-06-11 NOTE — Patient Instructions (Signed)
Romiplostim injection What is this medicine? ROMIPLOSTIM ([place] mi PLOE stim) helps your body make more platelets. This medicine is used to treat low platelets caused by chronic idiopathic thrombocytopenic purpura (ITP). This medicine may be used for other purposes; ask your health care provider or pharmacist if you have questions. What should I tell my health care provider before I take this medicine? They need to know if you have any of these conditions: -cancer or myelodysplastic syndrome -low blood counts, like low white cell, platelet, or red cell counts -take medicines that treat or prevent blood clots -an unusual or allergic reaction to romiplostim, mannitol, other medicines, foods, dyes, or preservatives -pregnant or trying to get pregnant -breast-feeding How should I use this medicine? This medicine is for injection under the skin. It is given by a health care professional in a hospital or clinic setting. A special MedGuide will be given to you before your injection. Read this information carefully each time. Talk to your pediatrician regarding the use of this medicine in children. Special care may be needed. Overdosage: If you think you have taken too much of this medicine contact a poison control center or emergency room at once. NOTE: This medicine is only for you. Do not share this medicine with others. What if I miss a dose? It is important not to miss your dose. Call your doctor or health care professional if you are unable to keep an appointment. What may interact with this medicine? Interactions are not expected. This list may not describe all possible interactions. Give your health care provider a list of all the medicines, herbs, non-prescription drugs, or dietary supplements you use. Also tell them if you smoke, drink alcohol, or use illegal drugs. Some items may interact with your medicine. What should I watch for while using this medicine? Your condition will be monitored  carefully while you are receiving this medicine. Visit your prescriber or health care professional for regular checks on your progress and for the needed blood tests. It is important to keep all appointments. What side effects may I notice from receiving this medicine? Side effects that you should report to your doctor or health care professional as soon as possible: -allergic reactions like skin rash, itching or hives, swelling of the face, lips, or tongue -shortness of breath, chest pain, swelling in a leg -unusual bleeding or bruising Side effects that usually do not require medical attention (report to your doctor or health care professional if they continue or are bothersome): -dizziness -headache -muscle aches -pain in arms and legs -stomach pain -trouble sleeping This list may not describe all possible side effects. Call your doctor for medical advice about side effects. You may report side effects to FDA at 1-800-FDA-1088. Where should I keep my medicine? This drug is given in a hospital or clinic and will not be stored at home. NOTE: This sheet is a summary. It may not cover all possible information. If you have questions about this medicine, talk to your doctor, pharmacist, or health care provider.    2016, Elsevier/Gold Standard. (2007-10-12 15:13:04) Cyanocobalamin, Vitamin B12 injection What is this medicine? CYANOCOBALAMIN (sye an oh koe BAL a min) is a man made form of vitamin B12. Vitamin B12 is used in the growth of healthy blood cells, nerve cells, and proteins in the body. It also helps with the metabolism of fats and carbohydrates. This medicine is used to treat people who can not absorb vitamin B12. This medicine may be used for other   purposes; ask your health care provider or pharmacist if you have questions. What should I tell my health care provider before I take this medicine? They need to know if you have any of these conditions: -kidney disease -Leber's  disease -megaloblastic anemia -an unusual or allergic reaction to cyanocobalamin, cobalt, other medicines, foods, dyes, or preservatives -pregnant or trying to get pregnant -breast-feeding How should I use this medicine? This medicine is injected into a muscle or deeply under the skin. It is usually given by a health care professional in a clinic or doctor's office. However, your doctor may teach you how to inject yourself. Follow all instructions. Talk to your pediatrician regarding the use of this medicine in children. Special care may be needed. Overdosage: If you think you have taken too much of this medicine contact a poison control center or emergency room at once. NOTE: This medicine is only for you. Do not share this medicine with others. What if I miss a dose? If you are given your dose at a clinic or doctor's office, call to reschedule your appointment. If you give your own injections and you miss a dose, take it as soon as you can. If it is almost time for your next dose, take only that dose. Do not take double or extra doses. What may interact with this medicine? -colchicine -heavy alcohol intake This list may not describe all possible interactions. Give your health care provider a list of all the medicines, herbs, non-prescription drugs, or dietary supplements you use. Also tell them if you smoke, drink alcohol, or use illegal drugs. Some items may interact with your medicine. What should I watch for while using this medicine? Visit your doctor or health care professional regularly. You may need blood work done while you are taking this medicine. You may need to follow a special diet. Talk to your doctor. Limit your alcohol intake and avoid smoking to get the best benefit. What side effects may I notice from receiving this medicine? Side effects that you should report to your doctor or health care professional as soon as possible: -allergic reactions like skin rash, itching or  hives, swelling of the face, lips, or tongue -blue tint to skin -chest tightness, pain -difficulty breathing, wheezing -dizziness -red, swollen painful area on the leg Side effects that usually do not require medical attention (report to your doctor or health care professional if they continue or are bothersome): -diarrhea -headache This list may not describe all possible side effects. Call your doctor for medical advice about side effects. You may report side effects to FDA at 1-800-FDA-1088. Where should I keep my medicine? Keep out of the reach of children. Store at room temperature between 15 and 30 degrees C (59 and 85 degrees F). Protect from light. Throw away any unused medicine after the expiration date. NOTE: This sheet is a summary. It may not cover all possible information. If you have questions about this medicine, talk to your doctor, pharmacist, or health care provider.    2016, Elsevier/Gold Standard. (2007-05-25 22:10:20)  

## 2016-06-13 DIAGNOSIS — D693 Immune thrombocytopenic purpura: Secondary | ICD-10-CM | POA: Diagnosis not present

## 2016-06-13 DIAGNOSIS — E119 Type 2 diabetes mellitus without complications: Secondary | ICD-10-CM | POA: Diagnosis not present

## 2016-06-13 DIAGNOSIS — I10 Essential (primary) hypertension: Secondary | ICD-10-CM | POA: Diagnosis not present

## 2016-06-13 DIAGNOSIS — I5022 Chronic systolic (congestive) heart failure: Secondary | ICD-10-CM | POA: Diagnosis not present

## 2016-06-18 ENCOUNTER — Other Ambulatory Visit (HOSPITAL_BASED_OUTPATIENT_CLINIC_OR_DEPARTMENT_OTHER): Payer: Medicare Other

## 2016-06-18 ENCOUNTER — Ambulatory Visit (HOSPITAL_BASED_OUTPATIENT_CLINIC_OR_DEPARTMENT_OTHER): Payer: Medicare Other

## 2016-06-18 VITALS — BP 165/55 | HR 68 | Temp 97.5°F | Resp 18

## 2016-06-18 DIAGNOSIS — E538 Deficiency of other specified B group vitamins: Secondary | ICD-10-CM

## 2016-06-18 DIAGNOSIS — Z95828 Presence of other vascular implants and grafts: Secondary | ICD-10-CM

## 2016-06-18 DIAGNOSIS — D693 Immune thrombocytopenic purpura: Secondary | ICD-10-CM

## 2016-06-18 LAB — CBC & DIFF AND RETIC
BASO%: 0.3 % (ref 0.0–2.0)
BASOS ABS: 0 10*3/uL (ref 0.0–0.1)
EOS%: 1.2 % (ref 0.0–7.0)
Eosinophils Absolute: 0.1 10*3/uL (ref 0.0–0.5)
HCT: 41.2 % (ref 38.4–49.9)
HEMOGLOBIN: 13.6 g/dL (ref 13.0–17.1)
Immature Retic Fract: 10.3 % (ref 3.00–10.60)
LYMPH%: 28.2 % (ref 14.0–49.0)
MCH: 31.3 pg (ref 27.2–33.4)
MCHC: 33 g/dL (ref 32.0–36.0)
MCV: 94.7 fL (ref 79.3–98.0)
MONO#: 0.9 10*3/uL (ref 0.1–0.9)
MONO%: 12.6 % (ref 0.0–14.0)
NEUT%: 57.7 % (ref 39.0–75.0)
NEUTROS ABS: 3.9 10*3/uL (ref 1.5–6.5)
NRBC: 0 % (ref 0–0)
Platelets: 223 10*3/uL (ref 140–400)
RBC: 4.35 10*6/uL (ref 4.20–5.82)
RDW: 12.4 % (ref 11.0–14.6)
Retic %: 1.6 % (ref 0.80–1.80)
Retic Ct Abs: 69.6 10*3/uL (ref 34.80–93.90)
WBC: 6.8 10*3/uL (ref 4.0–10.3)
lymph#: 1.9 10*3/uL (ref 0.9–3.3)

## 2016-06-18 MED ORDER — CYANOCOBALAMIN 1000 MCG/ML IJ SOLN
1000.0000 ug | INTRAMUSCULAR | Status: DC
  Administered 2016-06-18: 1000 ug via SUBCUTANEOUS

## 2016-06-18 MED ORDER — ROMIPLOSTIM 250 MCG ~~LOC~~ SOLR
50.0000 ug | SUBCUTANEOUS | Status: DC
  Administered 2016-06-18: 50 ug via SUBCUTANEOUS
  Filled 2016-06-18: qty 0.1

## 2016-06-18 NOTE — Patient Instructions (Signed)
Romiplostim injection What is this medicine? ROMIPLOSTIM ([place] mi PLOE stim) helps your body make more platelets. This medicine is used to treat low platelets caused by chronic idiopathic thrombocytopenic purpura (ITP). This medicine may be used for other purposes; ask your health care provider or pharmacist if you have questions. What should I tell my health care provider before I take this medicine? They need to know if you have any of these conditions: -cancer or myelodysplastic syndrome -low blood counts, like low white cell, platelet, or red cell counts -take medicines that treat or prevent blood clots -an unusual or allergic reaction to romiplostim, mannitol, other medicines, foods, dyes, or preservatives -pregnant or trying to get pregnant -breast-feeding How should I use this medicine? This medicine is for injection under the skin. It is given by a health care professional in a hospital or clinic setting. A special MedGuide will be given to you before your injection. Read this information carefully each time. Talk to your pediatrician regarding the use of this medicine in children. Special care may be needed. Overdosage: If you think you have taken too much of this medicine contact a poison control center or emergency room at once. NOTE: This medicine is only for you. Do not share this medicine with others. What if I miss a dose? It is important not to miss your dose. Call your doctor or health care professional if you are unable to keep an appointment. What may interact with this medicine? Interactions are not expected. This list may not describe all possible interactions. Give your health care provider a list of all the medicines, herbs, non-prescription drugs, or dietary supplements you use. Also tell them if you smoke, drink alcohol, or use illegal drugs. Some items may interact with your medicine. What should I watch for while using this medicine? Your condition will be monitored  carefully while you are receiving this medicine. Visit your prescriber or health care professional for regular checks on your progress and for the needed blood tests. It is important to keep all appointments. What side effects may I notice from receiving this medicine? Side effects that you should report to your doctor or health care professional as soon as possible: -allergic reactions like skin rash, itching or hives, swelling of the face, lips, or tongue -shortness of breath, chest pain, swelling in a leg -unusual bleeding or bruising Side effects that usually do not require medical attention (report to your doctor or health care professional if they continue or are bothersome): -dizziness -headache -muscle aches -pain in arms and legs -stomach pain -trouble sleeping This list may not describe all possible side effects. Call your doctor for medical advice about side effects. You may report side effects to FDA at 1-800-FDA-1088. Where should I keep my medicine? This drug is given in a hospital or clinic and will not be stored at home. NOTE: This sheet is a summary. It may not cover all possible information. If you have questions about this medicine, talk to your doctor, pharmacist, or health care provider.    2016, Elsevier/Gold Standard. (2007-10-12 15:13:04) Cyanocobalamin, Vitamin B12 injection What is this medicine? CYANOCOBALAMIN (sye an oh koe BAL a min) is a man made form of vitamin B12. Vitamin B12 is used in the growth of healthy blood cells, nerve cells, and proteins in the body. It also helps with the metabolism of fats and carbohydrates. This medicine is used to treat people who can not absorb vitamin B12. This medicine may be used for other   purposes; ask your health care provider or pharmacist if you have questions. What should I tell my health care provider before I take this medicine? They need to know if you have any of these conditions: -kidney disease -Leber's  disease -megaloblastic anemia -an unusual or allergic reaction to cyanocobalamin, cobalt, other medicines, foods, dyes, or preservatives -pregnant or trying to get pregnant -breast-feeding How should I use this medicine? This medicine is injected into a muscle or deeply under the skin. It is usually given by a health care professional in a clinic or doctor's office. However, your doctor may teach you how to inject yourself. Follow all instructions. Talk to your pediatrician regarding the use of this medicine in children. Special care may be needed. Overdosage: If you think you have taken too much of this medicine contact a poison control center or emergency room at once. NOTE: This medicine is only for you. Do not share this medicine with others. What if I miss a dose? If you are given your dose at a clinic or doctor's office, call to reschedule your appointment. If you give your own injections and you miss a dose, take it as soon as you can. If it is almost time for your next dose, take only that dose. Do not take double or extra doses. What may interact with this medicine? -colchicine -heavy alcohol intake This list may not describe all possible interactions. Give your health care provider a list of all the medicines, herbs, non-prescription drugs, or dietary supplements you use. Also tell them if you smoke, drink alcohol, or use illegal drugs. Some items may interact with your medicine. What should I watch for while using this medicine? Visit your doctor or health care professional regularly. You may need blood work done while you are taking this medicine. You may need to follow a special diet. Talk to your doctor. Limit your alcohol intake and avoid smoking to get the best benefit. What side effects may I notice from receiving this medicine? Side effects that you should report to your doctor or health care professional as soon as possible: -allergic reactions like skin rash, itching or  hives, swelling of the face, lips, or tongue -blue tint to skin -chest tightness, pain -difficulty breathing, wheezing -dizziness -red, swollen painful area on the leg Side effects that usually do not require medical attention (report to your doctor or health care professional if they continue or are bothersome): -diarrhea -headache This list may not describe all possible side effects. Call your doctor for medical advice about side effects. You may report side effects to FDA at 1-800-FDA-1088. Where should I keep my medicine? Keep out of the reach of children. Store at room temperature between 15 and 30 degrees C (59 and 85 degrees F). Protect from light. Throw away any unused medicine after the expiration date. NOTE: This sheet is a summary. It may not cover all possible information. If you have questions about this medicine, talk to your doctor, pharmacist, or health care provider.    2016, Elsevier/Gold Standard. (2007-05-25 22:10:20)  

## 2016-06-25 ENCOUNTER — Ambulatory Visit (HOSPITAL_BASED_OUTPATIENT_CLINIC_OR_DEPARTMENT_OTHER): Payer: Medicare Other

## 2016-06-25 ENCOUNTER — Emergency Department (HOSPITAL_BASED_OUTPATIENT_CLINIC_OR_DEPARTMENT_OTHER)
Admit: 2016-06-25 | Discharge: 2016-06-25 | Disposition: A | Discharge status: Home / Self Care | Payer: Medicare Other | Attending: Student | Admitting: Student

## 2016-06-25 ENCOUNTER — Emergency Department (HOSPITAL_COMMUNITY): Payer: Medicare Other

## 2016-06-25 ENCOUNTER — Encounter (HOSPITAL_COMMUNITY): Payer: Self-pay | Admitting: Nurse Practitioner

## 2016-06-25 ENCOUNTER — Other Ambulatory Visit: Payer: Self-pay

## 2016-06-25 ENCOUNTER — Emergency Department (HOSPITAL_COMMUNITY)
Admission: EM | Admit: 2016-06-25 | Discharge: 2016-06-25 | Disposition: A | Discharge status: Home / Self Care | Payer: Medicare Other | Attending: Emergency Medicine | Admitting: Emergency Medicine

## 2016-06-25 VITALS — BP 163/61 | HR 96 | Temp 97.7°F | Resp 18

## 2016-06-25 DIAGNOSIS — Z95828 Presence of other vascular implants and grafts: Secondary | ICD-10-CM

## 2016-06-25 DIAGNOSIS — Z79899 Other long term (current) drug therapy: Secondary | ICD-10-CM | POA: Diagnosis not present

## 2016-06-25 DIAGNOSIS — M79609 Pain in unspecified limb: Secondary | ICD-10-CM | POA: Diagnosis not present

## 2016-06-25 DIAGNOSIS — R600 Localized edema: Secondary | ICD-10-CM

## 2016-06-25 DIAGNOSIS — D693 Immune thrombocytopenic purpura: Secondary | ICD-10-CM | POA: Diagnosis present

## 2016-06-25 DIAGNOSIS — I2510 Atherosclerotic heart disease of native coronary artery without angina pectoris: Secondary | ICD-10-CM | POA: Insufficient documentation

## 2016-06-25 DIAGNOSIS — Z951 Presence of aortocoronary bypass graft: Secondary | ICD-10-CM | POA: Insufficient documentation

## 2016-06-25 DIAGNOSIS — E1140 Type 2 diabetes mellitus with diabetic neuropathy, unspecified: Secondary | ICD-10-CM | POA: Diagnosis not present

## 2016-06-25 DIAGNOSIS — N189 Chronic kidney disease, unspecified: Secondary | ICD-10-CM | POA: Insufficient documentation

## 2016-06-25 DIAGNOSIS — E1122 Type 2 diabetes mellitus with diabetic chronic kidney disease: Secondary | ICD-10-CM | POA: Insufficient documentation

## 2016-06-25 DIAGNOSIS — M25571 Pain in right ankle and joints of right foot: Secondary | ICD-10-CM | POA: Insufficient documentation

## 2016-06-25 DIAGNOSIS — M25551 Pain in right hip: Secondary | ICD-10-CM

## 2016-06-25 DIAGNOSIS — Z96641 Presence of right artificial hip joint: Secondary | ICD-10-CM | POA: Insufficient documentation

## 2016-06-25 DIAGNOSIS — M7989 Other specified soft tissue disorders: Secondary | ICD-10-CM | POA: Diagnosis not present

## 2016-06-25 DIAGNOSIS — Z87891 Personal history of nicotine dependence: Secondary | ICD-10-CM | POA: Insufficient documentation

## 2016-06-25 DIAGNOSIS — I129 Hypertensive chronic kidney disease with stage 1 through stage 4 chronic kidney disease, or unspecified chronic kidney disease: Secondary | ICD-10-CM | POA: Insufficient documentation

## 2016-06-25 MED ORDER — ROMIPLOSTIM 250 MCG ~~LOC~~ SOLR
50.0000 ug | SUBCUTANEOUS | Status: DC
  Administered 2016-06-25: 50 ug via SUBCUTANEOUS
  Filled 2016-06-25: qty 0.1

## 2016-06-25 NOTE — Progress Notes (Signed)
VASCULAR LAB PRELIMINARY  PRELIMINARY  PRELIMINARY  PRELIMINARY  Right lower extremity venous duplex completed.    Preliminary report:  Right:  No evidence of DVT, superficial thrombosis, or Baker's cyst.  Raffaela Ladley, RVS 06/25/2016, 1:09 PM

## 2016-06-25 NOTE — Progress Notes (Signed)
Pt states after receiving injection today he was going to report to [facility] ER for evaluation of Right ankle/foot/lower leg. Pt reports since Weds 06/19/16 Right lower leg, ankle and foot has been swelling and painful, but does resolve at night. Pitting edema noted in right ankle and lower leg, pt denies pain at this time. Dr. Irene Limbo aware and per Dr. Irene Limbo pt is to continue with reporting to ER as planned for further evaluation. Pt verbalizes understanding and stable at discharge.

## 2016-06-25 NOTE — ED Triage Notes (Signed)
Pt presents to [facility]ED for concerns of right leg edema and pain that started a week ago. He felt it was related to increased exertion from walking to doctor appointments and was concerned that he had sprained his ankle. He has tried hot and cold therapies at home w/o relief. He endorses sob but feels this is stable and chronic and worsened by exertion.

## 2016-06-25 NOTE — Discharge Instructions (Signed)
No signs of blood clot. X-rays are normal. This is likely chronic venous insufficiency. Need to follow back up with her primary care doctor to discuss possible need for further management. Return to the ED if he develop chest pain or shortness of breath that is worsening from her baseline or if he develop fevers or worsening symptoms.

## 2016-06-25 NOTE — ED Provider Notes (Signed)
[facility] DEPT Provider Note   CSN: [id number] Arrival date & time: 06/25/16  1008     History   Chief Complaint Chief Complaint  Patient presents with  . Leg Swelling  . Leg Pain    HPI Lawrence Warner is a 83 y.o. male.  HPI 83 year old Caucasian male past medical history significant for CAD, diabetes, hypertension that presents to the ED today with complaints of swelling in his right ankle and pain in his right ankle and right hip. Patient states that approximately 5 days ago he noticed significant swelling in his right ankle and his right leg with pain. States that he thought it was due to increased exertion from walking and was concerned that he might a sprained his ankle. He has been trying hot and cold compresses at home with little relief.Patient also complains of pain in his right hip states this has been there since his hip replacement. Denies any new pain. Has not followed up with orthopedist concerning the hip pain. Patient denies any history of DVT, prolonged immobilization, recent hospitalizations/surgeries, tobacco use. States that he is short of breath with exertion but this has been presents since he stopped smoking 50 years ago. States that is not increased. No new symptoms. Denies any chest pain. Patient not currently on blood thinners. Patient is able to bear weight on his right ankle but it does hurt. Denies any fever, chills, lightheadedness, dizziness, chest pain, nausea, emesis. Associated symptoms. Past Medical History:  Diagnosis Date  . BPH (benign prostatic hypertrophy)   . Chronic renal insufficiency   . Coronary artery disease    CABG in 2000  . Diabetes type 2, controlled ([place])    Diet-controlled  . Diabetic neuropathy ([place])   . Dyslipidemia   . Hypertension   . ITP (idiopathic thrombocytopenic purpura)   . Osteoarthritis     Patient Active Problem List   Diagnosis Date Noted  . Respiratory failure ([place]) 02/28/2016  . Acute respiratory  failure with hypoxemia ([place]) 02/28/2016  . HLD (hyperlipidemia) 02/28/2016  . AKI (acute kidney injury) ([place])   . Influenza   . Sepsis ([place])   . Constipation 12/07/2015  . Bradycardia 11/28/2015  . Port catheter in place 10/24/2015  . GERD (gastroesophageal reflux disease) 10/09/2015  . Gait instability 10/09/2015  . Chest pain 10/05/2015  . Diet-controlled diabetes mellitus ([place]) 05/07/2015  . CAD (coronary artery disease) 05/07/2015  . Essential hypertension 05/07/2015  . Muscle cramps 04/25/2015  . Shortness of breath 04/11/2015  . Idiopathic thrombocytopenic purpura ([place]) 09/27/2014    Past Surgical History:  Procedure Laterality Date  . CHOLECYSTECTOMY    . CORONARY ARTERY BYPASS GRAFT  2000  . Right hip replacement  2014  . TONSILLECTOMY         Home Medications    Prior to Admission medications   Medication Sig Start Date End Date Taking? Authorizing Provider  acetaminophen (TYLENOL) 500 MG tablet Take 250-1,000 mg by mouth every 6 (six) hours as needed for mild pain, moderate pain or headache.    Yes Historical Provider, MD  hydroxypropyl methylcellulose / hypromellose (ISOPTO TEARS / GONIOVISC) 2.5 % ophthalmic solution Place 1 drop into both eyes 3 (three) times daily as needed for dry eyes.   Yes Historical Provider, MD  losartan (COZAAR) 100 MG tablet Take 100 mg by mouth daily.   Yes Historical Provider, MD  magnesium chloride (SLOW-MAG) 64 MG TBEC SR tablet Take 1 tablet (64 mg total) by mouth daily. 04/25/15  Yes Gautam  Juleen China, MD  metoprolol tartrate (LOPRESSOR) 25 MG tablet Take 0.5 tablets (12.5 mg total) by mouth 2 (two) times daily. 10/09/15  Yes Hoyt Koch, MD  Multiple Vitamin (MULTIVITAMIN WITH MINERALS) TABS tablet Take 1 tablet by mouth daily.   Yes Historical Provider, MD  omeprazole (PRILOSEC) 20 MG capsule Take 20 mg by mouth daily.   Yes Historical Provider, MD  simvastatin (ZOCOR) 20 MG tablet Take 20 mg by mouth at bedtime.    Yes  Historical Provider, MD  nitroGLYCERIN (NITROSTAT) 0.4 MG SL tablet Place 1 tablet (0.4 mg total) under the tongue every 5 (five) minutes as needed for chest pain. 12/13/15 06/25/16  Minus Breeding, MD    Family History Family History  Problem Relation Age of Onset  . Lung disease Father   . Cancer Brother     Social History Social History  Substance Use Topics  . Smoking status: Former Smoker    Packs/day: 2.00    Years: 25.00  . Smokeless tobacco: Never Used  . Alcohol use No     Allergies   Alfuzosin hcl er and Penicillins   Review of Systems Review of Systems  Constitutional: Negative for chills and fever.  Respiratory: Positive for shortness of breath (baseline and unchanged ). Negative for cough.   Cardiovascular: Positive for leg swelling. Negative for chest pain.  Gastrointestinal: Negative for nausea and vomiting.  Musculoskeletal: Positive for arthralgias and joint swelling. Negative for back pain and gait problem.  Skin: Negative for color change and wound.  Neurological: Negative for dizziness, syncope, weakness, light-headedness and headaches.     Physical Exam Updated Vital Signs BP (!) 160/61   Pulse 88   Temp 97.7 F (36.5 C) (Oral)   Resp 15   SpO2 97%   Physical Exam  Constitutional: He appears well-developed and well-nourished. No distress.  HENT:  Head: Normocephalic and atraumatic.  Eyes: Right eye exhibits no discharge. Left eye exhibits no discharge. No scleral icterus.  Neck: Normal range of motion. Neck supple.  Cardiovascular: Normal rate, regular rhythm, normal heart sounds and intact distal pulses.   Pulmonary/Chest: Effort normal and breath sounds normal. No respiratory distress. He has no wheezes. He has no rales.  Musculoskeletal: Normal range of motion.  Edema to the right ankle with mild tenderness to the lateral malleolus. No signs of erythema, wound, overlying cellulitis. Full range of motion. Sensation intact to sharp/dull. Cap  refill normal. DP pulses are 2+ bilaterally. Patient with full range of motion of the right hip. No erythema noted. Able to ambulate. Full range of motion of the right knee without any pain. No erythema of any of the joints in the right leg. No calf tenderness.  Patient does have hyperpigmentation the bilateral lower extremities likely from chronic venous insufficiency.  Neurological: He is alert.  Skin: Skin is warm and dry. No pallor.  Nursing note and vitals reviewed.    ED Treatments / Results  Labs (all labs ordered are listed, but only abnormal results are displayed) Labs Reviewed - No data to display  EKG  EKG Interpretation  Date/Time:  Tuesday Jun 25 2016 12:01:56 EDT Ventricular Rate:  55 PR Interval:    QRS Duration: 166 QT Interval:  488 QTC Calculation: 467 R Axis:   -62 Text Interpretation:  Sinus rhythm Prolonged PR interval Left bundle branch block Confirmed by Lacinda Axon  MD, BRIAN ([id number]) on 06/25/2016 1:42:51 PM       Radiology Dg Ankle Complete Right  Result  Date: 06/25/2016 CLINICAL DATA:  Right hip and ankle pain over the last week. No known injury. Swelling. EXAM: RIGHT ANKLE - COMPLETE 3+ VIEW COMPARISON:  None. FINDINGS: Chronic degenerative changes of the ankle joint with marginal osteophytes. Question small intra-articular loose bodies. Small calcifications inferior to the lateral malleolus and medial malleolus are consistent with old ligamentous injuries. Chronic calcaneal spurs. Chronic midfoot degenerative changes. Generalized soft tissue swelling. Small superficial foreign object in the posteromedial soft tissues could be incidental and insignificant or could possibly relate to the regional soft tissue inflammation. IMPRESSION: No acute finding. Chronic degenerative changes of the ankle joint. Evidence of old medial and lateral ligamentous injuries. Generalized soft tissue swelling. Tiny radiopaque foreign object in posteromedial soft tissues that could be  chronic or possibly could be a source of inflammation. Marked with arrows on the film. Electronically Signed   By: Nelson Chimes M.D.   On: 06/25/2016 12:00   Dg Hip Unilat W Or Wo Pelvis 2-3 Views Right  Result Date: 06/25/2016 CLINICAL DATA:  Pain over the last week.  No known injury. EXAM: DG HIP (WITH OR WITHOUT PELVIS) 2-3V RIGHT COMPARISON:  None. FINDINGS: Previous total hip replacement on the right. Components appear well positioned. No radiographically detectable complication. Mild osteoarthritis of the left hip. IMPRESSION: Satisfactory appearance following right hip replacement. No acute finding. Electronically Signed   By: Nelson Chimes M.D.   On: 06/25/2016 12:01    Procedures Procedures (including critical care time)  Medications Ordered in ED Medications - No data to display   Initial Impression / Assessment and Plan / ED Course  I have reviewed the triage vital signs and the nursing notes.  Pertinent labs & imaging results that were available during my care of the patient were reviewed by me and considered in my medical decision making (see chart for details).     The patient presents to the ED with complaints of right ankle swelling and pain along with right hip pain. The ankle pain and swelling has been ongoing for the past week after increased walking. Patient also notes pain in his right hip that has been ongoing since his hip surgery "years ago". Exam patient with mild edema to the right ankle and mild lateral malleolus tenderness. Full range of motion of all joints the lower extremities. No erythema concerning for septic joint. Patient has no systemic symptoms. X-ray show mild edema but no acute abnormalities of the right ankle and right hip. Does note radiopaque foreign body that could be chronic in nature or source of inflammation. Did not appreciate any cellulitis or erythema around this area on the patient. Patient is able to ambulate with normal gait. Neurovascularly  intact. Given the swelling DVT study was ordered. No DVT was observed on ultrasound. Patient states he does have shortness of breath which has been persistent for the past 50 years since he stop smoking. He denies any new increase in shortness of breath. Denies any chest pain. EKG shows no acute changes or concern for right heart strain. Patient's vital signs are stable. He is not hypoxic. No tachypnea or tachycardia. Doubt PE. No bilateral lower show any edema concerning for congestive heart failure. Blood pressure is slightly elevated however patient did not take his blood pressure medicine today. Feel that patient's swelling in his right ankle likely due to overuse versus chronic venous insufficiency. Ace wrap was applied. Patient given orthopedic follow-up and encouraged follow-up with primary care doctor. Patient was examined and discussed with Dr.  Lacinda Axon who is agreeable to the above plan.Pt is hemodynamically stable, in NAD, & able to ambulate in the ED. Pain has been managed & has no complaints prior to dc. Pt is comfortable with above plan and is stable for discharge at this time. All questions were answered prior to disposition. Strict return precautions for f/u to the ED were discussed.   Final Clinical Impressions(s) / ED Diagnoses   Final diagnoses:  Acute right ankle pain  Leg edema, right  Right hip pain    New Prescriptions Discharge Medication List as of 06/25/2016  2:11 PM       Doristine Devoid, PA-C 06/26/16 1639    Nat Christen, MD 06/29/16 [phone]

## 2016-06-25 NOTE — Patient Instructions (Signed)
Romiplostim injection What is this medicine? ROMIPLOSTIM (roe mi PLOE stim) helps your body make more platelets. This medicine is used to treat low platelets caused by chronic idiopathic thrombocytopenic purpura (ITP). This medicine may be used for other purposes; ask your health care provider or pharmacist if you have questions. COMMON BRAND NAME(S): Nplate What should I tell my health care provider before I take this medicine? They need to know if you have any of these conditions: -cancer or myelodysplastic syndrome -low blood counts, like low white cell, platelet, or red cell counts -take medicines that treat or prevent blood clots -an unusual or allergic reaction to romiplostim, mannitol, other medicines, foods, dyes, or preservatives -pregnant or trying to get pregnant -breast-feeding How should I use this medicine? This medicine is for injection under the skin. It is given by a health care professional in a hospital or clinic setting. A special MedGuide will be given to you before your injection. Read this information carefully each time. Talk to your pediatrician regarding the use of this medicine in children. Special care may be needed. Overdosage: If you think you have taken too much of this medicine contact a poison control center or emergency room at once. NOTE: This medicine is only for you. Do not share this medicine with others. What if I miss a dose? It is important not to miss your dose. Call your doctor or health care professional if you are unable to keep an appointment. What may interact with this medicine? Interactions are not expected. This list may not describe all possible interactions. Give your health care provider a list of all the medicines, herbs, non-prescription drugs, or dietary supplements you use. Also tell them if you smoke, drink alcohol, or use illegal drugs. Some items may interact with your medicine. What should I watch for while using this  medicine? Your condition will be monitored carefully while you are receiving this medicine. Visit your prescriber or health care professional for regular checks on your progress and for the needed blood tests. It is important to keep all appointments. What side effects may I notice from receiving this medicine? Side effects that you should report to your doctor or health care professional as soon as possible: -allergic reactions like skin rash, itching or hives, swelling of the face, lips, or tongue -shortness of breath, chest pain, swelling in a leg -unusual bleeding or bruising Side effects that usually do not require medical attention (report to your doctor or health care professional if they continue or are bothersome): -dizziness -headache -muscle aches -pain in arms and legs -stomach pain -trouble sleeping This list may not describe all possible side effects. Call your doctor for medical advice about side effects. You may report side effects to FDA at 1-800-FDA-1088. Where should I keep my medicine? This drug is given in a hospital or clinic and will not be stored at home. NOTE: This sheet is a summary. It may not cover all possible information. If you have questions about this medicine, talk to your doctor, pharmacist, or health care provider.  2018 Elsevier/Gold Standard (2007-10-12 15:13:04)  

## 2016-07-02 ENCOUNTER — Other Ambulatory Visit (HOSPITAL_BASED_OUTPATIENT_CLINIC_OR_DEPARTMENT_OTHER): Payer: Medicare Other

## 2016-07-02 ENCOUNTER — Ambulatory Visit (HOSPITAL_BASED_OUTPATIENT_CLINIC_OR_DEPARTMENT_OTHER): Payer: Medicare Other

## 2016-07-02 VITALS — BP 167/57 | HR 60 | Temp 97.6°F | Resp 22

## 2016-07-02 DIAGNOSIS — Z95828 Presence of other vascular implants and grafts: Secondary | ICD-10-CM

## 2016-07-02 DIAGNOSIS — D693 Immune thrombocytopenic purpura: Secondary | ICD-10-CM

## 2016-07-02 LAB — COMPREHENSIVE METABOLIC PANEL
ALBUMIN: 3.5 g/dL (ref 3.5–5.0)
ALK PHOS: 79 U/L (ref 40–150)
ALT: 19 U/L (ref 0–55)
AST: 22 U/L (ref 5–34)
Anion Gap: 12 mEq/L — ABNORMAL HIGH (ref 3–11)
BILIRUBIN TOTAL: 0.45 mg/dL (ref 0.20–1.20)
BUN: 23 mg/dL (ref 7.0–26.0)
CALCIUM: 9.5 mg/dL (ref 8.4–10.4)
CO2: 22 mEq/L (ref 22–29)
Chloride: 105 mEq/L (ref 98–109)
Creatinine: 1.4 mg/dL — ABNORMAL HIGH (ref 0.7–1.3)
EGFR: 41 mL/min/{1.73_m2} — AB (ref >90)
Glucose: 179 mg/dl — ABNORMAL HIGH (ref 70–140)
Potassium: 4.6 mEq/L (ref 3.5–5.1)
Sodium: 139 mEq/L (ref 136–145)
TOTAL PROTEIN: 7 g/dL (ref 6.4–8.3)

## 2016-07-02 LAB — CBC & DIFF AND RETIC
BASO%: 0.2 % (ref 0.0–2.0)
BASOS ABS: 0 10*3/uL (ref 0.0–0.1)
EOS ABS: 0.1 10*3/uL (ref 0.0–0.5)
EOS%: 1.1 % (ref 0.0–7.0)
HEMATOCRIT: 39.9 % (ref 38.4–49.9)
HEMOGLOBIN: 13 g/dL (ref 13.0–17.1)
IMMATURE RETIC FRACT: 9 % (ref 3.00–10.60)
LYMPH%: 20.6 % (ref 14.0–49.0)
MCH: 31.2 pg (ref 27.2–33.4)
MCHC: 32.6 g/dL (ref 32.0–36.0)
MCV: 95.7 fL (ref 79.3–98.0)
MONO#: 0.8 10*3/uL (ref 0.1–0.9)
MONO%: 12 % (ref 0.0–14.0)
NEUT#: 4.1 10*3/uL (ref 1.5–6.5)
NEUT%: 66.1 % (ref 39.0–75.0)
Platelets: 186 10*3/uL (ref 140–400)
RBC: 4.17 10*6/uL — ABNORMAL LOW (ref 4.20–5.82)
RDW: 12.3 % (ref 11.0–14.6)
Retic %: 1.68 % (ref 0.80–1.80)
Retic Ct Abs: 70.06 10*3/uL (ref 34.80–93.90)
WBC: 6.3 10*3/uL (ref 4.0–10.3)
lymph#: 1.3 10*3/uL (ref 0.9–3.3)

## 2016-07-02 MED ORDER — ROMIPLOSTIM 250 MCG ~~LOC~~ SOLR
50.0000 ug | SUBCUTANEOUS | Status: DC
  Administered 2016-07-02: 50 ug via SUBCUTANEOUS
  Filled 2016-07-02: qty 0.1

## 2016-07-02 NOTE — Patient Instructions (Signed)
Romiplostim injection What is this medicine? ROMIPLOSTIM ([place] mi PLOE stim) helps your body make more platelets. This medicine is used to treat low platelets caused by chronic idiopathic thrombocytopenic purpura (ITP). This medicine may be used for other purposes; ask your health care provider or pharmacist if you have questions. What should I tell my health care provider before I take this medicine? They need to know if you have any of these conditions: -cancer or myelodysplastic syndrome -low blood counts, like low white cell, platelet, or red cell counts -take medicines that treat or prevent blood clots -an unusual or allergic reaction to romiplostim, mannitol, other medicines, foods, dyes, or preservatives -pregnant or trying to get pregnant -breast-feeding How should I use this medicine? This medicine is for injection under the skin. It is given by a health care professional in a hospital or clinic setting. A special MedGuide will be given to you before your injection. Read this information carefully each time. Talk to your pediatrician regarding the use of this medicine in children. Special care may be needed. Overdosage: If you think you have taken too much of this medicine contact a poison control center or emergency room at once. NOTE: This medicine is only for you. Do not share this medicine with others. What if I miss a dose? It is important not to miss your dose. Call your doctor or health care professional if you are unable to keep an appointment. What may interact with this medicine? Interactions are not expected. This list may not describe all possible interactions. Give your health care provider a list of all the medicines, herbs, non-prescription drugs, or dietary supplements you use. Also tell them if you smoke, drink alcohol, or use illegal drugs. Some items may interact with your medicine. What should I watch for while using this medicine? Your condition will be monitored  carefully while you are receiving this medicine. Visit your prescriber or health care professional for regular checks on your progress and for the needed blood tests. It is important to keep all appointments. What side effects may I notice from receiving this medicine? Side effects that you should report to your doctor or health care professional as soon as possible: -allergic reactions like skin rash, itching or hives, swelling of the face, lips, or tongue -shortness of breath, chest pain, swelling in a leg -unusual bleeding or bruising Side effects that usually do not require medical attention (report to your doctor or health care professional if they continue or are bothersome): -dizziness -headache -muscle aches -pain in arms and legs -stomach pain -trouble sleeping This list may not describe all possible side effects. Call your doctor for medical advice about side effects. You may report side effects to FDA at 1-800-FDA-1088. Where should I keep my medicine? This drug is given in a hospital or clinic and will not be stored at home. NOTE: This sheet is a summary. It may not cover all possible information. If you have questions about this medicine, talk to your doctor, pharmacist, or health care provider.    2016, Elsevier/Gold Standard. (2007-10-12 15:13:04) Cyanocobalamin, Vitamin B12 injection What is this medicine? CYANOCOBALAMIN (sye an oh koe BAL a min) is a man made form of vitamin B12. Vitamin B12 is used in the growth of healthy blood cells, nerve cells, and proteins in the body. It also helps with the metabolism of fats and carbohydrates. This medicine is used to treat people who can not absorb vitamin B12. This medicine may be used for other   purposes; ask your health care provider or pharmacist if you have questions. What should I tell my health care provider before I take this medicine? They need to know if you have any of these conditions: -kidney disease -Leber's  disease -megaloblastic anemia -an unusual or allergic reaction to cyanocobalamin, cobalt, other medicines, foods, dyes, or preservatives -pregnant or trying to get pregnant -breast-feeding How should I use this medicine? This medicine is injected into a muscle or deeply under the skin. It is usually given by a health care professional in a clinic or doctor's office. However, your doctor may teach you how to inject yourself. Follow all instructions. Talk to your pediatrician regarding the use of this medicine in children. Special care may be needed. Overdosage: If you think you have taken too much of this medicine contact a poison control center or emergency room at once. NOTE: This medicine is only for you. Do not share this medicine with others. What if I miss a dose? If you are given your dose at a clinic or doctor's office, call to reschedule your appointment. If you give your own injections and you miss a dose, take it as soon as you can. If it is almost time for your next dose, take only that dose. Do not take double or extra doses. What may interact with this medicine? -colchicine -heavy alcohol intake This list may not describe all possible interactions. Give your health care provider a list of all the medicines, herbs, non-prescription drugs, or dietary supplements you use. Also tell them if you smoke, drink alcohol, or use illegal drugs. Some items may interact with your medicine. What should I watch for while using this medicine? Visit your doctor or health care professional regularly. You may need blood work done while you are taking this medicine. You may need to follow a special diet. Talk to your doctor. Limit your alcohol intake and avoid smoking to get the best benefit. What side effects may I notice from receiving this medicine? Side effects that you should report to your doctor or health care professional as soon as possible: -allergic reactions like skin rash, itching or  hives, swelling of the face, lips, or tongue -blue tint to skin -chest tightness, pain -difficulty breathing, wheezing -dizziness -red, swollen painful area on the leg Side effects that usually do not require medical attention (report to your doctor or health care professional if they continue or are bothersome): -diarrhea -headache This list may not describe all possible side effects. Call your doctor for medical advice about side effects. You may report side effects to FDA at 1-800-FDA-1088. Where should I keep my medicine? Keep out of the reach of children. Store at room temperature between 15 and 30 degrees C (59 and 85 degrees F). Protect from light. Throw away any unused medicine after the expiration date. NOTE: This sheet is a summary. It may not cover all possible information. If you have questions about this medicine, talk to your doctor, pharmacist, or health care provider.    2016, Elsevier/Gold Standard. (2007-05-25 22:10:20)  

## 2016-07-08 ENCOUNTER — Ambulatory Visit (HOSPITAL_BASED_OUTPATIENT_CLINIC_OR_DEPARTMENT_OTHER): Payer: Medicare Other

## 2016-07-08 VITALS — BP 153/58 | HR 69 | Temp 97.8°F | Resp 20

## 2016-07-08 DIAGNOSIS — D693 Immune thrombocytopenic purpura: Secondary | ICD-10-CM

## 2016-07-08 DIAGNOSIS — Z95828 Presence of other vascular implants and grafts: Secondary | ICD-10-CM

## 2016-07-08 MED ORDER — ROMIPLOSTIM 250 MCG ~~LOC~~ SOLR
50.0000 ug | SUBCUTANEOUS | Status: DC
  Administered 2016-07-08: 50 ug via SUBCUTANEOUS
  Filled 2016-07-08: qty 0.1

## 2016-07-09 ENCOUNTER — Ambulatory Visit: Payer: Medicare Other

## 2016-07-16 ENCOUNTER — Ambulatory Visit (HOSPITAL_BASED_OUTPATIENT_CLINIC_OR_DEPARTMENT_OTHER): Payer: Medicare Other

## 2016-07-16 ENCOUNTER — Other Ambulatory Visit (HOSPITAL_BASED_OUTPATIENT_CLINIC_OR_DEPARTMENT_OTHER): Payer: Medicare Other

## 2016-07-16 VITALS — BP 166/69 | HR 62 | Temp 97.2°F | Resp 18

## 2016-07-16 DIAGNOSIS — E538 Deficiency of other specified B group vitamins: Secondary | ICD-10-CM

## 2016-07-16 DIAGNOSIS — Z95828 Presence of other vascular implants and grafts: Secondary | ICD-10-CM

## 2016-07-16 DIAGNOSIS — D693 Immune thrombocytopenic purpura: Secondary | ICD-10-CM

## 2016-07-16 LAB — CBC & DIFF AND RETIC
BASO%: 0.2 % (ref 0.0–2.0)
Basophils Absolute: 0 10*3/uL (ref 0.0–0.1)
EOS ABS: 0.1 10*3/uL (ref 0.0–0.5)
EOS%: 2 % (ref 0.0–7.0)
HCT: 39.8 % (ref 38.4–49.9)
HGB: 13 g/dL (ref 13.0–17.1)
Immature Retic Fract: 9.5 % (ref 3.00–10.60)
LYMPH%: 21.9 % (ref 14.0–49.0)
MCH: 31.2 pg (ref 27.2–33.4)
MCHC: 32.7 g/dL (ref 32.0–36.0)
MCV: 95.4 fL (ref 79.3–98.0)
MONO#: 0.9 10*3/uL (ref 0.1–0.9)
MONO%: 14 % (ref 0.0–14.0)
NEUT%: 61.9 % (ref 39.0–75.0)
NEUTROS ABS: 4 10*3/uL (ref 1.5–6.5)
NRBC: 0 % (ref 0–0)
Platelets: 187 10*3/uL (ref 140–400)
RBC: 4.17 10*6/uL — AB (ref 4.20–5.82)
RDW: 12.6 % (ref 11.0–14.6)
Retic %: 1.65 % (ref 0.80–1.80)
Retic Ct Abs: 68.81 10*3/uL (ref 34.80–93.90)
WBC: 6.4 10*3/uL (ref 4.0–10.3)
lymph#: 1.4 10*3/uL (ref 0.9–3.3)

## 2016-07-16 MED ORDER — CYANOCOBALAMIN 1000 MCG/ML IJ SOLN
1000.0000 ug | INTRAMUSCULAR | Status: DC
Start: 2016-07-16 — End: 2016-07-16
  Administered 2016-07-16: 1000 ug via SUBCUTANEOUS

## 2016-07-16 MED ORDER — ROMIPLOSTIM 250 MCG ~~LOC~~ SOLR
50.0000 ug | SUBCUTANEOUS | Status: DC
  Administered 2016-07-16: 50 ug via SUBCUTANEOUS
  Filled 2016-07-16: qty 0.1

## 2016-07-16 NOTE — Patient Instructions (Signed)
Romiplostim injection What is this medicine? ROMIPLOSTIM ([place] mi PLOE stim) helps your body make more platelets. This medicine is used to treat low platelets caused by chronic idiopathic thrombocytopenic purpura (ITP). This medicine may be used for other purposes; ask your health care provider or pharmacist if you have questions. What should I tell my health care provider before I take this medicine? They need to know if you have any of these conditions: -cancer or myelodysplastic syndrome -low blood counts, like low white cell, platelet, or red cell counts -take medicines that treat or prevent blood clots -an unusual or allergic reaction to romiplostim, mannitol, other medicines, foods, dyes, or preservatives -pregnant or trying to get pregnant -breast-feeding How should I use this medicine? This medicine is for injection under the skin. It is given by a health care professional in a hospital or clinic setting. A special MedGuide will be given to you before your injection. Read this information carefully each time. Talk to your pediatrician regarding the use of this medicine in children. Special care may be needed. Overdosage: If you think you have taken too much of this medicine contact a poison control center or emergency room at once. NOTE: This medicine is only for you. Do not share this medicine with others. What if I miss a dose? It is important not to miss your dose. Call your doctor or health care professional if you are unable to keep an appointment. What may interact with this medicine? Interactions are not expected. This list may not describe all possible interactions. Give your health care provider a list of all the medicines, herbs, non-prescription drugs, or dietary supplements you use. Also tell them if you smoke, drink alcohol, or use illegal drugs. Some items may interact with your medicine. What should I watch for while using this medicine? Your condition will be monitored  carefully while you are receiving this medicine. Visit your prescriber or health care professional for regular checks on your progress and for the needed blood tests. It is important to keep all appointments. What side effects may I notice from receiving this medicine? Side effects that you should report to your doctor or health care professional as soon as possible: -allergic reactions like skin rash, itching or hives, swelling of the face, lips, or tongue -shortness of breath, chest pain, swelling in a leg -unusual bleeding or bruising Side effects that usually do not require medical attention (report to your doctor or health care professional if they continue or are bothersome): -dizziness -headache -muscle aches -pain in arms and legs -stomach pain -trouble sleeping This list may not describe all possible side effects. Call your doctor for medical advice about side effects. You may report side effects to FDA at 1-800-FDA-1088. Where should I keep my medicine? This drug is given in a hospital or clinic and will not be stored at home. NOTE: This sheet is a summary. It may not cover all possible information. If you have questions about this medicine, talk to your doctor, pharmacist, or health care provider.    2016, Elsevier/Gold Standard. (2007-10-12 15:13:04) Cyanocobalamin, Vitamin B12 injection What is this medicine? CYANOCOBALAMIN (sye an oh koe BAL a min) is a man made form of vitamin B12. Vitamin B12 is used in the growth of healthy blood cells, nerve cells, and proteins in the body. It also helps with the metabolism of fats and carbohydrates. This medicine is used to treat people who can not absorb vitamin B12. This medicine may be used for other   purposes; ask your health care provider or pharmacist if you have questions. What should I tell my health care provider before I take this medicine? They need to know if you have any of these conditions: -kidney disease -Leber's  disease -megaloblastic anemia -an unusual or allergic reaction to cyanocobalamin, cobalt, other medicines, foods, dyes, or preservatives -pregnant or trying to get pregnant -breast-feeding How should I use this medicine? This medicine is injected into a muscle or deeply under the skin. It is usually given by a health care professional in a clinic or doctor's office. However, your doctor may teach you how to inject yourself. Follow all instructions. Talk to your pediatrician regarding the use of this medicine in children. Special care may be needed. Overdosage: If you think you have taken too much of this medicine contact a poison control center or emergency room at once. NOTE: This medicine is only for you. Do not share this medicine with others. What if I miss a dose? If you are given your dose at a clinic or doctor's office, call to reschedule your appointment. If you give your own injections and you miss a dose, take it as soon as you can. If it is almost time for your next dose, take only that dose. Do not take double or extra doses. What may interact with this medicine? -colchicine -heavy alcohol intake This list may not describe all possible interactions. Give your health care provider a list of all the medicines, herbs, non-prescription drugs, or dietary supplements you use. Also tell them if you smoke, drink alcohol, or use illegal drugs. Some items may interact with your medicine. What should I watch for while using this medicine? Visit your doctor or health care professional regularly. You may need blood work done while you are taking this medicine. You may need to follow a special diet. Talk to your doctor. Limit your alcohol intake and avoid smoking to get the best benefit. What side effects may I notice from receiving this medicine? Side effects that you should report to your doctor or health care professional as soon as possible: -allergic reactions like skin rash, itching or  hives, swelling of the face, lips, or tongue -blue tint to skin -chest tightness, pain -difficulty breathing, wheezing -dizziness -red, swollen painful area on the leg Side effects that usually do not require medical attention (report to your doctor or health care professional if they continue or are bothersome): -diarrhea -headache This list may not describe all possible side effects. Call your doctor for medical advice about side effects. You may report side effects to FDA at 1-800-FDA-1088. Where should I keep my medicine? Keep out of the reach of children. Store at room temperature between 15 and 30 degrees C (59 and 85 degrees F). Protect from light. Throw away any unused medicine after the expiration date. NOTE: This sheet is a summary. It may not cover all possible information. If you have questions about this medicine, talk to your doctor, pharmacist, or health care provider.    2016, Elsevier/Gold Standard. (2007-05-25 22:10:20)  

## 2016-07-17 DIAGNOSIS — I5022 Chronic systolic (congestive) heart failure: Secondary | ICD-10-CM | POA: Diagnosis not present

## 2016-07-17 DIAGNOSIS — I10 Essential (primary) hypertension: Secondary | ICD-10-CM | POA: Diagnosis not present

## 2016-07-17 DIAGNOSIS — E119 Type 2 diabetes mellitus without complications: Secondary | ICD-10-CM | POA: Diagnosis not present

## 2016-07-17 DIAGNOSIS — N183 Chronic kidney disease, stage 3 (moderate): Secondary | ICD-10-CM | POA: Diagnosis not present

## 2016-07-20 ENCOUNTER — Encounter (HOSPITAL_COMMUNITY): Payer: Self-pay | Admitting: Emergency Medicine

## 2016-07-20 ENCOUNTER — Emergency Department (HOSPITAL_COMMUNITY): Payer: Medicare Other

## 2016-07-20 ENCOUNTER — Other Ambulatory Visit: Payer: Self-pay

## 2016-07-20 ENCOUNTER — Inpatient Hospital Stay (HOSPITAL_COMMUNITY)
Admission: EM | Admit: 2016-07-20 | Discharge: 2016-07-26 | DRG: 189 | Disposition: A | Discharge status: Home Health | Payer: Medicare Other | Attending: Internal Medicine | Admitting: Internal Medicine

## 2016-07-20 DIAGNOSIS — M1990 Unspecified osteoarthritis, unspecified site: Secondary | ICD-10-CM | POA: Diagnosis present

## 2016-07-20 DIAGNOSIS — R0902 Hypoxemia: Secondary | ICD-10-CM

## 2016-07-20 DIAGNOSIS — Z880 Allergy status to penicillin: Secondary | ICD-10-CM

## 2016-07-20 DIAGNOSIS — D693 Immune thrombocytopenic purpura: Secondary | ICD-10-CM | POA: Diagnosis not present

## 2016-07-20 DIAGNOSIS — Z87891 Personal history of nicotine dependence: Secondary | ICD-10-CM

## 2016-07-20 DIAGNOSIS — J180 Bronchopneumonia, unspecified organism: Secondary | ICD-10-CM | POA: Diagnosis not present

## 2016-07-20 DIAGNOSIS — R0602 Shortness of breath: Secondary | ICD-10-CM

## 2016-07-20 DIAGNOSIS — R51 Headache: Secondary | ICD-10-CM | POA: Diagnosis not present

## 2016-07-20 DIAGNOSIS — J441 Chronic obstructive pulmonary disease with (acute) exacerbation: Secondary | ICD-10-CM | POA: Diagnosis not present

## 2016-07-20 DIAGNOSIS — R0600 Dyspnea, unspecified: Secondary | ICD-10-CM

## 2016-07-20 DIAGNOSIS — I10 Essential (primary) hypertension: Secondary | ICD-10-CM | POA: Diagnosis present

## 2016-07-20 DIAGNOSIS — J9601 Acute respiratory failure with hypoxia: Secondary | ICD-10-CM | POA: Diagnosis not present

## 2016-07-20 DIAGNOSIS — Z888 Allergy status to other drugs, medicaments and biological substances status: Secondary | ICD-10-CM

## 2016-07-20 DIAGNOSIS — J9621 Acute and chronic respiratory failure with hypoxia: Secondary | ICD-10-CM | POA: Diagnosis present

## 2016-07-20 DIAGNOSIS — I248 Other forms of acute ischemic heart disease: Secondary | ICD-10-CM | POA: Diagnosis not present

## 2016-07-20 DIAGNOSIS — Z9049 Acquired absence of other specified parts of digestive tract: Secondary | ICD-10-CM

## 2016-07-20 DIAGNOSIS — E785 Hyperlipidemia, unspecified: Secondary | ICD-10-CM | POA: Diagnosis present

## 2016-07-20 DIAGNOSIS — Z951 Presence of aortocoronary bypass graft: Secondary | ICD-10-CM

## 2016-07-20 DIAGNOSIS — N400 Enlarged prostate without lower urinary tract symptoms: Secondary | ICD-10-CM | POA: Diagnosis present

## 2016-07-20 DIAGNOSIS — E1140 Type 2 diabetes mellitus with diabetic neuropathy, unspecified: Secondary | ICD-10-CM | POA: Diagnosis present

## 2016-07-20 DIAGNOSIS — E1122 Type 2 diabetes mellitus with diabetic chronic kidney disease: Secondary | ICD-10-CM | POA: Diagnosis present

## 2016-07-20 DIAGNOSIS — R05 Cough: Secondary | ICD-10-CM | POA: Diagnosis not present

## 2016-07-20 DIAGNOSIS — Z66 Do not resuscitate: Secondary | ICD-10-CM | POA: Diagnosis present

## 2016-07-20 DIAGNOSIS — Z809 Family history of malignant neoplasm, unspecified: Secondary | ICD-10-CM

## 2016-07-20 DIAGNOSIS — I5032 Chronic diastolic (congestive) heart failure: Secondary | ICD-10-CM | POA: Diagnosis present

## 2016-07-20 DIAGNOSIS — I130 Hypertensive heart and chronic kidney disease with heart failure and stage 1 through stage 4 chronic kidney disease, or unspecified chronic kidney disease: Secondary | ICD-10-CM | POA: Diagnosis present

## 2016-07-20 DIAGNOSIS — E119 Type 2 diabetes mellitus without complications: Secondary | ICD-10-CM

## 2016-07-20 DIAGNOSIS — N183 Chronic kidney disease, stage 3 (moderate): Secondary | ICD-10-CM | POA: Diagnosis present

## 2016-07-20 DIAGNOSIS — I2510 Atherosclerotic heart disease of native coronary artery without angina pectoris: Secondary | ICD-10-CM | POA: Diagnosis present

## 2016-07-20 DIAGNOSIS — K219 Gastro-esophageal reflux disease without esophagitis: Secondary | ICD-10-CM | POA: Diagnosis present

## 2016-07-20 DIAGNOSIS — Z836 Family history of other diseases of the respiratory system: Secondary | ICD-10-CM

## 2016-07-20 DIAGNOSIS — R069 Unspecified abnormalities of breathing: Secondary | ICD-10-CM | POA: Diagnosis not present

## 2016-07-20 DIAGNOSIS — F0390 Unspecified dementia without behavioral disturbance: Secondary | ICD-10-CM | POA: Diagnosis present

## 2016-07-20 DIAGNOSIS — J40 Bronchitis, not specified as acute or chronic: Secondary | ICD-10-CM | POA: Diagnosis not present

## 2016-07-20 DIAGNOSIS — E1121 Type 2 diabetes mellitus with diabetic nephropathy: Secondary | ICD-10-CM | POA: Diagnosis present

## 2016-07-20 DIAGNOSIS — J440 Chronic obstructive pulmonary disease with acute lower respiratory infection: Secondary | ICD-10-CM | POA: Diagnosis present

## 2016-07-20 DIAGNOSIS — Z96641 Presence of right artificial hip joint: Secondary | ICD-10-CM | POA: Diagnosis present

## 2016-07-20 LAB — I-STAT TROPONIN, ED: TROPONIN I, POC: 0 ng/mL (ref 0.00–0.08)

## 2016-07-20 LAB — BRAIN NATRIURETIC PEPTIDE: B Natriuretic Peptide: 256 pg/mL — ABNORMAL HIGH (ref 0.0–100.0)

## 2016-07-20 LAB — I-STAT CG4 LACTIC ACID, ED
Lactic Acid, Venous: 3.8 mmol/L (ref 0.5–1.9)
Lactic Acid, Venous: 5.26 mmol/L (ref 0.5–1.9)

## 2016-07-20 LAB — COMPREHENSIVE METABOLIC PANEL
ALT: 24 U/L (ref 17–63)
AST: 35 U/L (ref 15–41)
Albumin: 3.6 g/dL (ref 3.5–5.0)
Alkaline Phosphatase: 67 U/L (ref 38–126)
Anion gap: 11 (ref 5–15)
BUN: 21 mg/dL — AB (ref 6–20)
CHLORIDE: 106 mmol/L (ref 101–111)
CO2: 20 mmol/L — AB (ref 22–32)
CREATININE: 1.44 mg/dL — AB (ref 0.61–1.24)
Calcium: 9.1 mg/dL (ref 8.9–10.3)
GFR, EST AFRICAN AMERICAN: 46 mL/min — AB (ref >60)
GFR, EST NON AFRICAN AMERICAN: 40 mL/min — AB (ref >60)
Glucose, Bld: 193 mg/dL — ABNORMAL HIGH (ref 65–99)
POTASSIUM: 3.9 mmol/L (ref 3.5–5.1)
SODIUM: 137 mmol/L (ref 135–145)
Total Bilirubin: 0.3 mg/dL (ref 0.3–1.2)
Total Protein: 7 g/dL (ref 6.5–8.1)

## 2016-07-20 LAB — CBC
HCT: 40.4 % (ref 39.0–52.0)
Hemoglobin: 13 g/dL (ref 13.0–17.0)
MCH: 30.8 pg (ref 26.0–34.0)
MCHC: 32.2 g/dL (ref 30.0–36.0)
MCV: 95.7 fL (ref 78.0–100.0)
PLATELETS: 188 10*3/uL (ref 150–400)
RBC: 4.22 MIL/uL (ref 4.22–5.81)
RDW: 12.7 % (ref 11.5–15.5)
WBC: 8.4 10*3/uL (ref 4.0–10.5)

## 2016-07-20 LAB — INFLUENZA PANEL BY PCR (TYPE A & B)
INFLAPCR: NEGATIVE
INFLBPCR: NEGATIVE

## 2016-07-20 LAB — TROPONIN I
TROPONIN I: 0.04 ng/mL — AB (ref <0.03)
Troponin I: <0.03 ng/mL (ref <0.03)

## 2016-07-20 MED ORDER — MAGNESIUM OXIDE 400 (241.3 MG) MG PO TABS
400.0000 mg | ORAL_TABLET | Freq: Every day | ORAL | Status: DC
  Administered 2016-07-20 – 2016-07-21 (×2): 400 mg via ORAL
  Filled 2016-07-20 (×2): qty 1

## 2016-07-20 MED ORDER — ADULT MULTIVITAMIN W/MINERALS CH
1.0000 | ORAL_TABLET | Freq: Every day | ORAL | Status: DC
  Administered 2016-07-21 – 2016-07-22 (×2): 1 via ORAL
  Filled 2016-07-20 (×2): qty 1

## 2016-07-20 MED ORDER — MAGNESIUM OXIDE 400 MG PO TABS: 400.0000 mg | ORAL_TABLET | Freq: Every day | ORAL | Status: DC

## 2016-07-20 MED ORDER — ALBUTEROL SULFATE (2.5 MG/3ML) 0.083% IN NEBU
5.0000 mg | INHALATION_SOLUTION | Freq: Once | RESPIRATORY_TRACT | Status: AC
  Administered 2016-07-20: 5 mg via RESPIRATORY_TRACT
  Filled 2016-07-20: qty 6

## 2016-07-20 MED ORDER — ENOXAPARIN SODIUM 30 MG/0.3ML ~~LOC~~ SOLN
30.0000 mg | SUBCUTANEOUS | Status: DC
  Administered 2016-07-20 – 2016-07-25 (×6): 30 mg via SUBCUTANEOUS
  Filled 2016-07-20 (×6): qty 0.3

## 2016-07-20 MED ORDER — NITROGLYCERIN 0.4 MG SL SUBL: 0.4000 mg | SUBLINGUAL_TABLET | SUBLINGUAL | Status: DC | PRN

## 2016-07-20 MED ORDER — HYPROMELLOSE (GONIOSCOPIC) 2.5 % OP SOLN: 1.0000 [drp] | Freq: Three times a day (TID) | OPHTHALMIC | Status: DC | PRN

## 2016-07-20 MED ORDER — LEVOFLOXACIN 500 MG PO TABS
500.0000 mg | ORAL_TABLET | Freq: Once | ORAL | Status: AC
  Administered 2016-07-20: 500 mg via ORAL
  Filled 2016-07-20: qty 1

## 2016-07-20 MED ORDER — ALBUTEROL SULFATE (2.5 MG/3ML) 0.083% IN NEBU
2.5000 mg | INHALATION_SOLUTION | RESPIRATORY_TRACT | Status: DC | PRN
  Administered 2016-07-21 – 2016-07-25 (×4): 2.5 mg via RESPIRATORY_TRACT
  Filled 2016-07-20 (×4): qty 3

## 2016-07-20 MED ORDER — SODIUM CHLORIDE 0.9 % IV BOLUS (SEPSIS)
500.0000 mL | Freq: Once | INTRAVENOUS | Status: AC
  Administered 2016-07-20: 500 mL via INTRAVENOUS

## 2016-07-20 MED ORDER — METHYLPREDNISOLONE SODIUM SUCC 40 MG IJ SOLR
40.0000 mg | Freq: Two times a day (BID) | INTRAMUSCULAR | Status: DC
  Administered 2016-07-20 – 2016-07-22 (×4): 40 mg via INTRAVENOUS
  Filled 2016-07-20 (×5): qty 1

## 2016-07-20 MED ORDER — IPRATROPIUM-ALBUTEROL 0.5-2.5 (3) MG/3ML IN SOLN
3.0000 mL | Freq: Four times a day (QID) | RESPIRATORY_TRACT | Status: DC
  Administered 2016-07-21 – 2016-07-26 (×20): 3 mL via RESPIRATORY_TRACT
  Filled 2016-07-20 (×22): qty 3

## 2016-07-20 MED ORDER — FUROSEMIDE 10 MG/ML IJ SOLN
40.0000 mg | Freq: Once | INTRAMUSCULAR | Status: AC
  Administered 2016-07-20: 40 mg via INTRAVENOUS
  Filled 2016-07-20: qty 4

## 2016-07-20 MED ORDER — PREDNISONE 20 MG PO TABS
60.0000 mg | ORAL_TABLET | Freq: Once | ORAL | Status: AC
  Administered 2016-07-20: 60 mg via ORAL
  Filled 2016-07-20: qty 3

## 2016-07-20 MED ORDER — SODIUM CHLORIDE 0.9% FLUSH
3.0000 mL | Freq: Two times a day (BID) | INTRAVENOUS | Status: DC
  Administered 2016-07-20: 10 mL via INTRAVENOUS
  Administered 2016-07-21 – 2016-07-26 (×11): 3 mL via INTRAVENOUS

## 2016-07-20 MED ORDER — PANTOPRAZOLE SODIUM 40 MG PO TBEC
40.0000 mg | DELAYED_RELEASE_TABLET | Freq: Every day | ORAL | Status: DC
  Administered 2016-07-21 – 2016-07-22 (×2): 40 mg via ORAL
  Filled 2016-07-20 (×2): qty 1

## 2016-07-20 MED ORDER — SIMVASTATIN 20 MG PO TABS
20.0000 mg | ORAL_TABLET | Freq: Every day | ORAL | Status: DC
  Administered 2016-07-20 – 2016-07-25 (×6): 20 mg via ORAL
  Filled 2016-07-20 (×6): qty 1

## 2016-07-20 MED ORDER — ACETAMINOPHEN 500 MG PO TABS
250.0000 mg | ORAL_TABLET | Freq: Four times a day (QID) | ORAL | Status: DC | PRN
  Administered 2016-07-20 – 2016-07-23 (×8): 500 mg via ORAL
  Administered 2016-07-23: 250 mg via ORAL
  Administered 2016-07-24 – 2016-07-26 (×7): 500 mg via ORAL
  Filled 2016-07-20: qty 1
  Filled 2016-07-20: qty 2
  Filled 2016-07-20 (×3): qty 1
  Filled 2016-07-20: qty 2
  Filled 2016-07-20: qty 1
  Filled 2016-07-20: qty 2
  Filled 2016-07-20 (×8): qty 1

## 2016-07-20 MED ORDER — LEVOFLOXACIN IN D5W 500 MG/100ML IV SOLN
500.0000 mg | INTRAVENOUS | Status: DC
  Filled 2016-07-20: qty 100

## 2016-07-20 MED ORDER — IPRATROPIUM-ALBUTEROL 0.5-2.5 (3) MG/3ML IN SOLN
3.0000 mL | RESPIRATORY_TRACT | Status: DC
  Administered 2016-07-20 (×2): 3 mL via RESPIRATORY_TRACT
  Filled 2016-07-20: qty 3

## 2016-07-20 NOTE — ED Notes (Signed)
Dr.ray shown results of Lactic Acid. ED-Lab

## 2016-07-20 NOTE — ED Provider Notes (Addendum)
[facility] DEPT Provider Note   CSN: [id number] Arrival date & time: 07/20/16  1243     History   Chief Complaint Chief Complaint  Patient presents with  . Shortness of Breath    HPI Lawrence Warner is a 83 y.o. male.  HPI  83 year old man history of ITP, hypertension, coronary artery disease, diabetes, respiratory failure who presents today with cough congestion and shortness of breath. He states that he has had cough over the past several days. It  Is productive of dark sputum. He is moderately short of breath. He has had decreased activity due to this. He has had some chills but does not know if he has had fever. He has peripheral swelling but does not think it is above baseline. He is complaining of headache but denies any chest pain.    Past Medical History:  Diagnosis Date  . BPH (benign prostatic hypertrophy)   . Chronic renal insufficiency   . Coronary artery disease    CABG in 2000  . Diabetes type 2, controlled ([place])    Diet-controlled  . Diabetic neuropathy ([place])   . Dyslipidemia   . Hypertension   . ITP (idiopathic thrombocytopenic purpura)   . Osteoarthritis     Patient Active Problem List   Diagnosis Date Noted  . Respiratory failure ([place]) 02/28/2016  . Acute respiratory failure with hypoxemia ([place]) 02/28/2016  . HLD (hyperlipidemia) 02/28/2016  . AKI (acute kidney injury) ([place])   . Influenza   . Sepsis ([place])   . Constipation 12/07/2015  . Bradycardia 11/28/2015  . Port catheter in place 10/24/2015  . GERD (gastroesophageal reflux disease) 10/09/2015  . Gait instability 10/09/2015  . Chest pain 10/05/2015  . Diet-controlled diabetes mellitus ([place]) 05/07/2015  . CAD (coronary artery disease) 05/07/2015  . Essential hypertension 05/07/2015  . Muscle cramps 04/25/2015  . Shortness of breath 04/11/2015  . Idiopathic thrombocytopenic purpura ([place]) 09/27/2014    Past Surgical History:  Procedure Laterality Date  . CHOLECYSTECTOMY    .  CORONARY ARTERY BYPASS GRAFT  2000  . Right hip replacement  2014  . TONSILLECTOMY         Home Medications    Prior to Admission medications   Medication Sig Start Date End Date Taking? Authorizing Provider  acetaminophen (TYLENOL) 500 MG tablet Take 250-1,000 mg by mouth every 6 (six) hours as needed for mild pain, moderate pain or headache.    Yes [provider]  losartan (COZAAR) 100 MG tablet Take 100 mg by mouth daily.   Yes [provider]  magnesium oxide (MAG-OX) 400 MG tablet Take 400 mg by mouth at bedtime.   Yes [provider]  metoprolol tartrate (LOPRESSOR) 25 MG tablet Take 0.5 tablets (12.5 mg total) by mouth 2 (two) times daily. Patient taking differently: Take 6.25 mg by mouth 2 (two) times daily.  10/09/15  Yes Hoyt Koch, MD  Multiple Vitamin (MULTIVITAMIN WITH MINERALS) TABS tablet Take 1 tablet by mouth daily.   Yes [provider]  nitroGLYCERIN (NITROSTAT) 0.4 MG SL tablet Place 1 tablet (0.4 mg total) under the tongue every 5 (five) minutes as needed for chest pain. 12/13/15 07/20/16 Yes Minus Breeding, MD  omeprazole (PRILOSEC) 20 MG capsule Take 20 mg by mouth daily.   Yes [provider]  simvastatin (ZOCOR) 20 MG tablet Take 20 mg by mouth at bedtime.    Yes [provider]  hydroxypropyl methylcellulose / hypromellose (ISOPTO TEARS / GONIOVISC) 2.5 % ophthalmic solution Place  1 drop into both eyes 3 (three) times daily as needed for dry eyes.    [provider]  magnesium chloride (SLOW-MAG) 64 MG TBEC SR tablet Take 1 tablet (64 mg total) by mouth daily. Patient not taking: Reported on 07/20/2016 04/25/15   [name], MD    Family History Family History  Problem Relation Age of Onset  . Lung disease Father   . Cancer Brother     Social History Social History  Substance Use Topics  . Smoking status: Former Smoker    Packs/day: 2.00    Years: 25.00  . Smokeless  tobacco: Never Used  . Alcohol use No     Allergies   Alfuzosin hcl er and Penicillins   Review of Systems Review of Systems  All other systems reviewed and are negative.    Physical Exam Updated Vital Signs BP (!) 159/57   Pulse 99   Temp 98.4 F (36.9 C) (Rectal)   Resp 15   Ht 1.702 m (5\' 7" )   Wt 97.5 kg (215 lb)   SpO2 97%   BMI 33.67 kg/m   Physical Exam  Constitutional: He is oriented to person, place, and time. He appears well-developed and well-nourished.  HENT:  Head: Normocephalic and atraumatic.  Right Ear: External ear normal.  Left Ear: External ear normal.  Eyes: Pupils are equal, round, and reactive to light.  Neck: Normal range of motion.  Cardiovascular: Tachycardia present.   Pulmonary/Chest:  Increased work of breathing with bibasilar rales and expiratory wheezes  Abdominal: Soft. Bowel sounds are normal.  Musculoskeletal: He exhibits edema.  Neurological: He is alert and oriented to person, place, and time. No cranial nerve deficit. Coordination normal.  Skin: Skin is warm and dry. Capillary refill takes less than 2 seconds.  Psychiatric: He has a normal mood and affect.  Nursing note and vitals reviewed.    ED Treatments / Results  Labs (all labs ordered are listed, but only abnormal results are displayed) Labs Reviewed  COMPREHENSIVE METABOLIC PANEL - Abnormal; Notable for the following:       Result Value   CO2 20 (*)    Glucose, Bld 193 (*)    BUN 21 (*)    Creatinine, Ser 1.44 (*)    GFR calc non Af Amer 40 (*)    GFR calc Af Amer 46 (*)    All other components within normal limits  I-STAT CG4 LACTIC ACID, ED - Abnormal; Notable for the following:    Lactic Acid, Venous 3.80 (*)    All other components within normal limits  CBC  BRAIN NATRIURETIC PEPTIDE  I-STAT TROPOININ, ED    EKG  EKG Interpretation  Date/Time:  Saturday Jul 20 2016 12:52:01 EDT Ventricular Rate:  107 PR Interval:    QRS Duration: 162 QT  Interval:  431 QTC Calculation: 576 R Axis:   -103 Text Interpretation:  Sinus tachycardia Left bundle branch block Confirmed by Kyrstyn Greear MD, Uriel Dowding ([id number]) on 07/20/2016 1:10:46 PM       Radiology Ct Head Wo Contrast  Result Date: 07/20/2016 CLINICAL DATA:  Headache. EXAM: CT HEAD WITHOUT CONTRAST TECHNIQUE: Contiguous axial images were obtained from the base of the skull through the vertex without intravenous contrast. COMPARISON:  None. FINDINGS: Brain: Mild diffuse cortical atrophy is noted. No mass effect or midline shift is noted. Ventricular size is within normal limits. There is no evidence of mass lesion, hemorrhage or acute infarction. Vascular: No hyperdense vessel or unexpected calcification.  Skull: Normal. Negative for fracture or focal lesion. Sinuses/Orbits: No acute finding. Other: None. IMPRESSION: Mild diffuse cortical atrophy. No acute intracranial abnormality seen. Electronically Signed   By: Marijo Conception, M.D.   On: 07/20/2016 14:21   Dg Chest Port 1 View  Result Date: 07/20/2016 CLINICAL DATA:  83 year old male with shortness of breath and productive cough with yellow mucus for the past 2 days. EXAM: PORTABLE CHEST 1 VIEW COMPARISON:  Chest x-Ares Cardozo 02/28/2016. FINDINGS: Lung volumes are low. No consolidative airspace disease. No pleural effusions. No pneumothorax. No pulmonary nodule or mass noted. Pulmonary vasculature and the cardiomediastinal silhouette are within normal limits. Aortic atherosclerosis. Status post median sternotomy for CABG including LIMA. IMPRESSION: 1. Low lung volumes without radiographic evidence of acute cardiopulmonary disease. 2. Aortic atherosclerosis. Electronically Signed   By: Vinnie Langton M.D.   On: 07/20/2016 13:36    Procedures Procedures (including critical care time)  Medications Ordered in ED Medications  predniSONE (DELTASONE) tablet 60 mg (not administered)  albuterol (PROVENTIL) (2.5 MG/3ML) 0.083% nebulizer solution 5 mg (5 mg  Nebulization Given 07/20/16 1419)     Initial Impression / Assessment and Plan / ED Course  I have reviewed the triage vital signs and the nursing notes.  Pertinent labs & imaging results that were available during my care of the patient were reviewed by me and considered in my medical decision making (see chart for details).     83 year old man presents today with dyspnea and productive cough. Chest x-Cataldo Cosgriff without evidence of acute MI but left bundle branch block is present. An initial oxygen saturations 89% but have improved to 98% on breathing treatment Chest x-Maridee Slape without any evidence of pneumonia or CHF. Patient improving here after albuterol and prednisone. However, he remains with increased work of breathing. Plan Levaquin, admission, continued breathing treatments and steroids. 1-bronchitis/wheezing/dyspnea 2-itp- platelets 188 Renal insuffiiciency- stable with creainne at 1.44 4-hyperglycemia with know dm 5- lactic acid elevated- doubt sepsis, no fever, normal wbc, but will give some fluids and recheck.  Discussed with Dr.Akula and she will see for admission  Final Clinical Impressions(s) / ED Diagnoses   Final diagnoses:  Bronchitis  Shortness of breath  Hypoxia    New Prescriptions New Prescriptions   No medications on file     Pattricia Boss, MD 07/20/16 1453    Pattricia Boss, MD 07/20/16 1513

## 2016-07-20 NOTE — Progress Notes (Signed)
CRITICAL VALUE ALERT  Critical Value:  Troponin 0.04  Date & Time Notied:  07/20/2016 23:20  Provider Notified: Olevia Bowens  Orders Received/Actions taken: Continue to trend troponins

## 2016-07-20 NOTE — ED Triage Notes (Signed)
Per EMS: Pt c/o of SOB and HA.  Pt has had cough x 2 days coughing up yellow mucus.  Pt has taken 500 Mg of Tylenol today for HA with no relief. Pt A&Ox4. Pt has received 1 Albuterol and 2 Duoneb from EMS.

## 2016-07-20 NOTE — ED Notes (Signed)
Pt was placed on 2 L [place] per Dr Jeanell Sparrow.

## 2016-07-20 NOTE — H&P (Addendum)
History and Physical    Lawrence Warner CZY:[id number] DOB: 12/03/1918 DOA: 07/20/2016  PCP: Hoyt Koch, MD  Patient coming from: retirement facility , [facility].   I have personally briefly reviewed patient's old medical records in [place]  Chief Complaint:   HPI: Lawrence Warner is a 83 y.o. male with medical history significant of CAD, s/p CABG IN 2000, DM, hypertension, ITP, BPH, stage 3 CKD, osteoarthritis, is here for sob, chills, cough with productive sputum, wheezing,  For 3 to 4 days. He denies nausea or vomiting, headache, dizziness, . He denies orthopnea, has pedal edema. He denies chest pain or syncopal episodes. No sick contacts. On arrival to ED, he was found to be hypoxic, required 2 lit of [place] oxygen. Tachycardic, tachypnea, required 3 duo neb treatments and diffusely wheezing and coughing.  cxr does not show any pneumonia, lab work showed, elevated BNP, elevated lactic acid., creatinine of 1.4 and normal cbc.  He also underwent CT head without contrast which was unremarkable.     Review of Systems: As per HPI otherwise 10 point review of systems negative.    Past Medical History:  Diagnosis Date  . BPH (benign prostatic hypertrophy)   . Chronic renal insufficiency   . Coronary artery disease    CABG in 2000  . Diabetes type 2, controlled ([place])    Diet-controlled  . Diabetic neuropathy ([place])   . Dyslipidemia   . Hypertension   . ITP (idiopathic thrombocytopenic purpura)   . Osteoarthritis     Past Surgical History:  Procedure Laterality Date  . CHOLECYSTECTOMY    . CORONARY ARTERY BYPASS GRAFT  2000  . Right hip replacement  2014  . TONSILLECTOMY       reports that he has quit smoking. He has a 50.00 pack-year smoking history. He has never used smokeless tobacco. He reports that he does not drink alcohol or use drugs.  Allergies  Allergen Reactions  . Alfuzosin Hcl Er Other (See Comments)    confusion  . Penicillins Hives  and Other (See Comments)    Has patient had a PCN reaction causing immediate rash, facial/tongue/throat swelling, SOB or lightheadedness with hypotension: No Has patient had a PCN reaction causing severe rash involving mucus membranes or skin necrosis: No Has patient had a PCN reaction that required hospitalization No Has patient had a PCN reaction occurring within the last 10 years: No If all of the above answers are "NO", then may proceed with Cephalosporin use.    Family History  Problem Relation Age of Onset  . Lung disease Father   . Cancer Brother    Could not be reviewed, pt in mod distress.   Prior to Admission medications   Medication Sig Start Date End Date Taking? Authorizing Provider  acetaminophen (TYLENOL) 500 MG tablet Take 250-1,000 mg by mouth every 6 (six) hours as needed for mild pain, moderate pain or headache.    Yes [provider]  losartan (COZAAR) 100 MG tablet Take 100 mg by mouth daily.   Yes [provider]  magnesium oxide (MAG-OX) 400 MG tablet Take 400 mg by mouth at bedtime.   Yes [provider]  metoprolol tartrate (LOPRESSOR) 25 MG tablet Take 0.5 tablets (12.5 mg total) by mouth 2 (two) times daily. Patient taking differently: Take 6.25 mg by mouth 2 (two) times daily.  10/09/15  Yes Hoyt Koch, MD  Multiple Vitamin (MULTIVITAMIN WITH MINERALS) TABS tablet Take 1 tablet by mouth daily.  Yes [provider]  nitroGLYCERIN (NITROSTAT) 0.4 MG SL tablet Place 1 tablet (0.4 mg total) under the tongue every 5 (five) minutes as needed for chest pain. 12/13/15 07/20/16 Yes Minus Breeding, MD  omeprazole (PRILOSEC) 20 MG capsule Take 20 mg by mouth daily.   Yes [provider]  simvastatin (ZOCOR) 20 MG tablet Take 20 mg by mouth at bedtime.    Yes [provider]  hydroxypropyl methylcellulose / hypromellose (ISOPTO TEARS / GONIOVISC) 2.5 % ophthalmic solution Place 1 drop into both eyes 3 (three)  times daily as needed for dry eyes.    [provider]  magnesium chloride (SLOW-MAG) 64 MG TBEC SR tablet Take 1 tablet (64 mg total) by mouth daily. Patient not taking: Reported on 07/20/2016 04/25/15   [name], MD    Physical Exam: Vitals:   07/20/16 1612 07/20/16 1615 07/20/16 1634 07/20/16 1635  BP:   (!) 151/46   Pulse: 87 87 92 90  Resp: (!) 23 (!) 23 (!) 22 17  Temp:      TempSrc:      SpO2: 97% 98% 98% 98%  Weight:      Height:        Constitutional: NAD, calm, comfortable Vitals:   07/20/16 1612 07/20/16 1615 07/20/16 1634 07/20/16 1635  BP:   (!) 151/46   Pulse: 87 87 92 90  Resp: (!) 23 (!) 23 (!) 22 17  Temp:      TempSrc:      SpO2: 97% 98% 98% 98%  Weight:      Height:       Eyes: PERRL, lids and conjunctivae normal ENMT: Mucous membranes are moist. Posterior pharynx clear of any exudate or lesions.Normal dentition.  Neck: normal, supple, no masses, no thyromegaly Respiratory: bilateral anteriorly wheezing, diminished air entry at bases.   Cardiovascular: Regular rate and rhythm, no murmurs / rubs / gallops. 2+ edema, 2+ pedal pulses. No carotid bruits.  Abdomen: no tenderness, no masses palpated. No hepatosplenomegaly. Bowel sounds positive.  Musculoskeletal: no clubbing or cyanosis. Pedal edema.  Skin: no rashes, lesions, ulcers. No induration Neurologic: CN 2-12 grossly intact. Sensation intact, DTR normal. Strength 5/5 in all 4.  Psychiatric: anxious.    Labs on Admission: I have personally reviewed following labs and imaging studies  CBC:  Recent Labs Lab 07/16/16 0925 07/20/16 1334  WBC 6.4 8.4  NEUTROABS 4.0  --   HGB 13.0 13.0  HCT 39.8 40.4  MCV 95.4 95.7  PLT 187 858   Basic Metabolic Panel:  Recent Labs Lab 07/20/16 1334  NA 137  K 3.9  CL 106  CO2 20*  GLUCOSE 193*  BUN 21*  CREATININE 1.44*  CALCIUM 9.1   GFR: Estimated Creatinine Clearance: 34.2 mL/min (A) (by C-G formula based on SCr of 1.44  mg/dL (H)). Liver Function Tests:  Recent Labs Lab 07/20/16 1334  AST 35  ALT 24  ALKPHOS 67  BILITOT 0.3  PROT 7.0  ALBUMIN 3.6   No results for input(s): LIPASE, AMYLASE in the last 168 hours. No results for input(s): AMMONIA in the last 168 hours. Coagulation Profile: No results for input(s): INR, PROTIME in the last 168 hours. Cardiac Enzymes: No results for input(s): CKTOTAL, CKMB, CKMBINDEX, TROPONINI in the last 168 hours. BNP (last 3 results) No results for input(s): PROBNP in the last 8760 hours. HbA1C: No results for input(s): HGBA1C in the last 72 hours. CBG: No results for input(s): GLUCAP in the last  168 hours. Lipid Profile: No results for input(s): CHOL, HDL, LDLCALC, TRIG, CHOLHDL, LDLDIRECT in the last 72 hours. Thyroid Function Tests: No results for input(s): TSH, T4TOTAL, FREET4, T3FREE, THYROIDAB in the last 72 hours. Anemia Panel: No results for input(s): VITAMINB12, FOLATE, FERRITIN, TIBC, IRON, RETICCTPCT in the last 72 hours. Urine analysis:    Component Value Date/Time   COLORURINE YELLOW 02/28/2016 1318   APPEARANCEUR HAZY (A) 02/28/2016 1318   LABSPEC 1.013 02/28/2016 1318   PHURINE 5.0 02/28/2016 1318   GLUCOSEU NEGATIVE 02/28/2016 1318   HGBUR NEGATIVE 02/28/2016 1318   BILIRUBINUR NEGATIVE 02/28/2016 1318   KETONESUR NEGATIVE 02/28/2016 1318   PROTEINUR 100 (A) 02/28/2016 1318   NITRITE NEGATIVE 02/28/2016 1318   LEUKOCYTESUR TRACE (A) 02/28/2016 1318    Radiological Exams on Admission: Ct Head Wo Contrast  Result Date: 07/20/2016 CLINICAL DATA:  Headache. EXAM: CT HEAD WITHOUT CONTRAST TECHNIQUE: Contiguous axial images were obtained from the base of the skull through the vertex without intravenous contrast. COMPARISON:  None. FINDINGS: Brain: Mild diffuse cortical atrophy is noted. No mass effect or midline shift is noted. Ventricular size is within normal limits. There is no evidence of mass lesion, hemorrhage or acute infarction.  Vascular: No hyperdense vessel or unexpected calcification. Skull: Normal. Negative for fracture or focal lesion. Sinuses/Orbits: No acute finding. Other: None. IMPRESSION: Mild diffuse cortical atrophy. No acute intracranial abnormality seen. Electronically Signed   By: Marijo Conception, M.D.   On: 07/20/2016 14:21   Dg Chest Port 1 View  Result Date: 07/20/2016 CLINICAL DATA:  83 year old male with shortness of breath and productive cough with yellow mucus for the past 2 days. EXAM: PORTABLE CHEST 1 VIEW COMPARISON:  Chest x-ray 02/28/2016. FINDINGS: Lung volumes are low. No consolidative airspace disease. No pleural effusions. No pneumothorax. No pulmonary nodule or mass noted. Pulmonary vasculature and the cardiomediastinal silhouette are within normal limits. Aortic atherosclerosis. Status post median sternotomy for CABG including LIMA. IMPRESSION: 1. Low lung volumes without radiographic evidence of acute cardiopulmonary disease. 2. Aortic atherosclerosis. Electronically Signed   By: Vinnie Langton M.D.   On: 07/20/2016 13:36    EKG: Independently reviewed. Tachycardia, LBBB  Assessment/Plan Active Problems:   Acute respiratory failure (HCC)  Acute respiratory failure with hypoxia possibly secondary to acute on chronic systolic heart failure and bronchitis/ hyper reactive airway disease  Admit to telemetry, start him on duonebs scheduled and IV solumedrol for bronchitis and wheezing.   a component of fluid over load, as his bnp is elevated and he has pedal edema, and some rales on exam.   start IV lasix for diuresis,  Echocardiogram.  Strict intake and output,  Daily weights.  IV antibiotics for bronchitis and possibly early pneumonia as he reports sob, chills, and productive cough, hypoxic.  Get influenza pcr to check for flu.    Elevated lactic acid/ SIRS:  Trend lactic acid levels.    CKD STAGE 3.  Appears to be at baseline.    ITP; platelet count looks good.   Diabetes  [place];  Pt says diet controlled. CBG (last 3)  No results for input(s): GLUCAP in the last 72 hours.  Will start him SSI.  Get hgba1c.   Hypertension:   Sub optimal will restart home meds in am.        DVT prophylaxis: lovenox.  Code Status: DNR Family Communication: none at bedside. Called both sons at the numbers in epic, went to voicemail. Disposition Plan: pending further eval.  Consults  called: none.  Admission status: obs tele   Treanna Dumler MD [facility] Pager [phone]  If 7PM-7AM, please contact night-coverage [email or web address] Password [facility]  07/20/2016, 4:42 PM

## 2016-07-20 NOTE — ED Notes (Signed)
Patient transported to CT 

## 2016-07-21 ENCOUNTER — Other Ambulatory Visit (HOSPITAL_COMMUNITY): Payer: Medicare Other

## 2016-07-21 DIAGNOSIS — E785 Hyperlipidemia, unspecified: Secondary | ICD-10-CM | POA: Diagnosis present

## 2016-07-21 DIAGNOSIS — Z9049 Acquired absence of other specified parts of digestive tract: Secondary | ICD-10-CM | POA: Diagnosis not present

## 2016-07-21 DIAGNOSIS — R0902 Hypoxemia: Secondary | ICD-10-CM

## 2016-07-21 DIAGNOSIS — J40 Bronchitis, not specified as acute or chronic: Secondary | ICD-10-CM | POA: Diagnosis not present

## 2016-07-21 DIAGNOSIS — M1990 Unspecified osteoarthritis, unspecified site: Secondary | ICD-10-CM | POA: Diagnosis present

## 2016-07-21 DIAGNOSIS — Z880 Allergy status to penicillin: Secondary | ICD-10-CM | POA: Diagnosis not present

## 2016-07-21 DIAGNOSIS — Z888 Allergy status to other drugs, medicaments and biological substances status: Secondary | ICD-10-CM | POA: Diagnosis not present

## 2016-07-21 DIAGNOSIS — J180 Bronchopneumonia, unspecified organism: Secondary | ICD-10-CM | POA: Diagnosis present

## 2016-07-21 DIAGNOSIS — Z96641 Presence of right artificial hip joint: Secondary | ICD-10-CM | POA: Diagnosis present

## 2016-07-21 DIAGNOSIS — I248 Other forms of acute ischemic heart disease: Secondary | ICD-10-CM | POA: Diagnosis present

## 2016-07-21 DIAGNOSIS — E119 Type 2 diabetes mellitus without complications: Secondary | ICD-10-CM | POA: Diagnosis not present

## 2016-07-21 DIAGNOSIS — N183 Chronic kidney disease, stage 3 (moderate): Secondary | ICD-10-CM | POA: Diagnosis present

## 2016-07-21 DIAGNOSIS — I340 Nonrheumatic mitral (valve) insufficiency: Secondary | ICD-10-CM | POA: Diagnosis not present

## 2016-07-21 DIAGNOSIS — Z87891 Personal history of nicotine dependence: Secondary | ICD-10-CM | POA: Diagnosis not present

## 2016-07-21 DIAGNOSIS — R0602 Shortness of breath: Secondary | ICD-10-CM | POA: Diagnosis not present

## 2016-07-21 DIAGNOSIS — J441 Chronic obstructive pulmonary disease with (acute) exacerbation: Secondary | ICD-10-CM | POA: Diagnosis not present

## 2016-07-21 DIAGNOSIS — I2510 Atherosclerotic heart disease of native coronary artery without angina pectoris: Secondary | ICD-10-CM | POA: Diagnosis not present

## 2016-07-21 DIAGNOSIS — J440 Chronic obstructive pulmonary disease with acute lower respiratory infection: Secondary | ICD-10-CM | POA: Diagnosis present

## 2016-07-21 DIAGNOSIS — I130 Hypertensive heart and chronic kidney disease with heart failure and stage 1 through stage 4 chronic kidney disease, or unspecified chronic kidney disease: Secondary | ICD-10-CM | POA: Diagnosis present

## 2016-07-21 DIAGNOSIS — E1121 Type 2 diabetes mellitus with diabetic nephropathy: Secondary | ICD-10-CM | POA: Diagnosis present

## 2016-07-21 DIAGNOSIS — Z836 Family history of other diseases of the respiratory system: Secondary | ICD-10-CM | POA: Diagnosis not present

## 2016-07-21 DIAGNOSIS — R0600 Dyspnea, unspecified: Secondary | ICD-10-CM | POA: Diagnosis not present

## 2016-07-21 DIAGNOSIS — D693 Immune thrombocytopenic purpura: Secondary | ICD-10-CM | POA: Diagnosis not present

## 2016-07-21 DIAGNOSIS — N400 Enlarged prostate without lower urinary tract symptoms: Secondary | ICD-10-CM | POA: Diagnosis present

## 2016-07-21 DIAGNOSIS — Z951 Presence of aortocoronary bypass graft: Secondary | ICD-10-CM | POA: Diagnosis not present

## 2016-07-21 DIAGNOSIS — Z809 Family history of malignant neoplasm, unspecified: Secondary | ICD-10-CM | POA: Diagnosis not present

## 2016-07-21 DIAGNOSIS — E1140 Type 2 diabetes mellitus with diabetic neuropathy, unspecified: Secondary | ICD-10-CM | POA: Diagnosis present

## 2016-07-21 DIAGNOSIS — K219 Gastro-esophageal reflux disease without esophagitis: Secondary | ICD-10-CM | POA: Diagnosis not present

## 2016-07-21 DIAGNOSIS — I5032 Chronic diastolic (congestive) heart failure: Secondary | ICD-10-CM | POA: Diagnosis present

## 2016-07-21 DIAGNOSIS — J9601 Acute respiratory failure with hypoxia: Secondary | ICD-10-CM | POA: Diagnosis not present

## 2016-07-21 LAB — BASIC METABOLIC PANEL
ANION GAP: 12 (ref 5–15)
BUN: 28 mg/dL — AB (ref 6–20)
CHLORIDE: 104 mmol/L (ref 101–111)
CO2: 21 mmol/L — AB (ref 22–32)
Calcium: 9.1 mg/dL (ref 8.9–10.3)
Creatinine, Ser: 1.62 mg/dL — ABNORMAL HIGH (ref 0.61–1.24)
GFR calc Af Amer: 40 mL/min — ABNORMAL LOW (ref >60)
GFR calc non Af Amer: 34 mL/min — ABNORMAL LOW (ref >60)
GLUCOSE: 217 mg/dL — AB (ref 65–99)
POTASSIUM: 4.1 mmol/L (ref 3.5–5.1)
Sodium: 137 mmol/L (ref 135–145)

## 2016-07-21 LAB — GLUCOSE, CAPILLARY
GLUCOSE-CAPILLARY: 176 mg/dL — AB (ref 65–99)
Glucose-Capillary: 175 mg/dL — ABNORMAL HIGH (ref 65–99)
Glucose-Capillary: 216 mg/dL — ABNORMAL HIGH (ref 65–99)
Glucose-Capillary: 196 mg/dL — ABNORMAL HIGH (ref 65–99)

## 2016-07-21 LAB — TROPONIN I: Troponin I: 0.04 ng/mL (ref <0.03)

## 2016-07-21 MED ORDER — METOPROLOL TARTRATE 25 MG PO TABS: 6.2500 mg | ORAL_TABLET | Freq: Two times a day (BID) | ORAL | Status: DC

## 2016-07-21 MED ORDER — FUROSEMIDE 10 MG/ML IJ SOLN: 40.0000 mg | Freq: Every day | INTRAMUSCULAR | Status: DC

## 2016-07-21 MED ORDER — ORAL CARE MOUTH RINSE
15.0000 mL | Freq: Two times a day (BID) | OROMUCOSAL | Status: DC
  Administered 2016-07-21 – 2016-07-26 (×9): 15 mL via OROMUCOSAL

## 2016-07-21 MED ORDER — INSULIN ASPART 100 UNIT/ML ~~LOC~~ SOLN
0.0000 [IU] | Freq: Three times a day (TID) | SUBCUTANEOUS | Status: DC
  Administered 2016-07-21 – 2016-07-22 (×3): 2 [IU] via SUBCUTANEOUS
  Administered 2016-07-22: 3 [IU] via SUBCUTANEOUS
  Administered 2016-07-23: 1 [IU] via SUBCUTANEOUS
  Administered 2016-07-23: 3 [IU] via SUBCUTANEOUS
  Administered 2016-07-23: 2 [IU] via SUBCUTANEOUS
  Administered 2016-07-24 (×2): 5 [IU] via SUBCUTANEOUS
  Administered 2016-07-24: 1 [IU] via SUBCUTANEOUS
  Administered 2016-07-25: 3 [IU] via SUBCUTANEOUS
  Administered 2016-07-25: 1 [IU] via SUBCUTANEOUS
  Administered 2016-07-25: 5 [IU] via SUBCUTANEOUS
  Administered 2016-07-26: 2 [IU] via SUBCUTANEOUS

## 2016-07-21 MED ORDER — LEVOFLOXACIN IN D5W 250 MG/50ML IV SOLN
250.0000 mg | INTRAVENOUS | Status: DC
  Administered 2016-07-21 – 2016-07-22 (×2): 250 mg via INTRAVENOUS
  Filled 2016-07-21 (×3): qty 50

## 2016-07-21 MED ORDER — METOPROLOL TARTRATE 25 MG/10 ML ORAL SUSPENSION
6.2500 mg | Freq: Two times a day (BID) | ORAL | Status: DC
  Administered 2016-07-21 – 2016-07-22 (×3): 6.25 mg via ORAL
  Filled 2016-07-21 (×3): qty 5

## 2016-07-21 NOTE — Evaluation (Signed)
Physical Therapy Evaluation Patient Details Name: Lawrence Warner MRN: [id number] DOB: 07/11/1918 Today's Date: 07/21/2016   History of Present Illness  Lawrence Warner is a 83 y.o. male with medical history significant of CAD, s/p CABG IN 2000, DM, hypertension, ITP, BPH, stage 3 CKD, osteoarthritis, is here for sob, chills, cough with productive sputum, wheezing. Admitted for acute respiratory failure.  Clinical Impression  Pt admitted with above diagnosis. Pt currently with functional limitations due to the deficits listed below (see PT Problem List). Deconditioned. Moderately dyspneic with ambulation up to 75 feel this afternoon on 4L supplemental O2 at 95% saturation throughout, HR to 106. From assisted living facility, and states he was receiving HHPT prior to admission. Pt will benefit from skilled PT to increase their independence and safety with mobility to allow discharge to the venue listed below.       Follow Up Recommendations Home health PT (Return to ALF)    Equipment Recommendations  None recommended by PT    Recommendations for Other Services       Precautions / Restrictions Precautions Precautions: Fall Precaution Comments: Monitor O2 Restrictions Weight Bearing Restrictions: No      Mobility  Bed Mobility Overal bed mobility: Modified Independent             General bed mobility comments: extra time  Transfers Overall transfer level: Needs assistance Equipment used: None Transfers: Sit to/from Stand Sit to Stand: Min guard         General transfer comment: Min guard for safety. Use of bed rail for support. VC for technique. Slow but without physical assist from PT.  Ambulation/Gait Ambulation/Gait assistance: Supervision Ambulation Distance (Feet): 75 Feet Assistive device: None Gait Pattern/deviations: Step-through pattern;Decreased stride length;Shuffle Gait velocity: slow Gait velocity interpretation: <1.8 ft/sec, indicative of risk for  recurrent falls General Gait Details: Supervision for safety, on 4L supplemental O2, moderately dyspneic with SpO2 avg 95%. Required one standing rest break to complete distance. Cues for larger stride due to shuffle. No overt loss of balance noted with simple gait.  Stairs            Wheelchair Mobility    Modified Rankin (Stroke Patients Only)       Balance Overall balance assessment: Needs assistance Sitting-balance support: No upper extremity supported Sitting balance-Leahy Scale: Normal     Standing balance support: No upper extremity supported;During functional activity Standing balance-Leahy Scale: Good Standing balance comment: Able to static march, reach towards floor but not flex too far due to back pain.                             Pertinent Vitals/Pain Pain Assessment: No/denies pain    Home Living Family/patient expects to be discharged to:: Assisted living Living Arrangements: Alone   Type of Home: Assisted living         Home Equipment: Gilford Rile - 2 wheels;Cane - single point;Electric scooter;Grab bars - toilet;Shower seat - built in      Prior Function Level of Independence: Independent with assistive device(s)         Comments: using SPC for ambulation     Hand Dominance        Extremity/Trunk Assessment   Upper Extremity Assessment Upper Extremity Assessment: Defer to OT evaluation    Lower Extremity Assessment Lower Extremity Assessment: Generalized weakness       Communication   Communication: No difficulties  Cognition Arousal/Alertness: Awake/alert Behavior During Therapy: [facility]  for tasks assessed/performed Overall Cognitive Status: Within Functional Limits for tasks assessed                                        General Comments General comments (skin integrity, edema, etc.): HR to 106 with gait, SpO2 95% on 4L O2.    Exercises     Assessment/Plan    PT Assessment Patient needs continued  PT services  PT Problem List Decreased strength;Decreased activity tolerance;Decreased balance;Decreased mobility;Decreased range of motion;Cardiopulmonary status limiting activity       PT Treatment Interventions DME instruction;Gait training;Functional mobility training;Therapeutic activities;Therapeutic exercise;Balance training;Neuromuscular re-education;Patient/family education    PT Goals (Current goals can be found in the Care Plan section)  Acute Rehab PT Goals Patient Stated Goal: Go back to ALF PT Goal Formulation: With patient Time For Goal Achievement: 08/04/16 Potential to Achieve Goals: Good    Frequency Min 3X/week   Barriers to discharge        Co-evaluation               AM-PAC PT "6 Clicks" Daily Activity  Outcome Measure Difficulty turning over in bed (including adjusting bedclothes, sheets and blankets)?: A Little Difficulty moving from lying on back to sitting on the side of the bed? : A Little Difficulty sitting down on and standing up from a chair with arms (e.g., wheelchair, bedside commode, etc,.)?: A Little Help needed moving to and from a bed to chair (including a wheelchair)?: None Help needed walking in hospital room?: None Help needed climbing 3-5 steps with a railing? : A Little 6 Click Score: 20    End of Session Equipment Utilized During Treatment: Gait belt;Oxygen Activity Tolerance: Patient tolerated treatment well;Patient limited by fatigue Patient left: in chair;with call bell/phone within reach;with chair alarm set;with nursing/sitter in room Nurse Communication: Mobility status PT Visit Diagnosis: Muscle weakness (generalized) (M62.81);Other abnormalities of gait and mobility (R26.89)    Time: 1751-0258 PT Time Calculation (min) (ACUTE ONLY): 19 min   Charges:   PT Evaluation $PT Eval Low Complexity: 1 Procedure     PT G Codes:   PT G-Codes **NOT FOR INPATIENT CLASS** Functional Assessment Tool Used: AM-PAC 6 Clicks Basic  Mobility;Clinical judgement Functional Limitation: Mobility: Walking and moving around Mobility: Walking and Moving Around Current Status (N2778): At least 20 percent but less than 40 percent impaired, limited or restricted Mobility: Walking and Moving Around Goal Status [phone]): At least 1 percent but less than 20 percent impaired, limited or restricted    Elayne Snare, PT, DPT [place] 07/21/2016, 9:42 AM

## 2016-07-21 NOTE — Progress Notes (Addendum)
Patient ID: Lawrence Warner, male   DOB: 02/14/1919, 83 y.o.   MRN: [id number]    PROGRESS NOTE  Lawrence Warner  EYC:[id number] DOB: 01/19/1919 DOA: 07/20/2016  PCP: Hoyt Koch, MD   Brief Narrative:  Pt is 83 yo male with known CAD, s/p CABG in 2000, HTM, BPH, CKD stage III, presented with 3-4 days duration of progressively worsening cough productive of yellow sputum, dyspnea, wheezing, poor oral intake. In ED, pt was found to be hypoxic and required 2 L oxygen via [place].   Assessment & Plan:  Acute respiratory failure with hypoxia  - secondary to acute on chronic systolic heart failure, ? Bronchitis, reactive airway disease, COPD - pt reports feeling better this AM - this is the weight trend since admission: Filed Weights   07/20/16 1244 07/21/16 0530  Weight: 97.5 kg (215 lb) 97.5 kg (215 lb)  - continue bronchodilators scheduled and as needed - also on solumedrol and will continue for now, plan to taper down in next 24 hours - plan to obtain CT chest for clearer evaluation if pt's respiratory status not better  - pt also started on Lasix and will hold today as Cr is up a bit since yesterday  - continue to monitor daily weights, strict I/O - patient also given empiric Levaquin and will continue for now - ECHO is still pending   ? Acute bronchitis - continue, BD's, solumedrol, empiric ABX  - lactic acid still elevated - will repeat lactic acid in AM  CKD stage III - Cr is up today, holding lasix  ITP - continue to monitor platelet counts    DM type II with complications of nephropathy - keep on SSI  - A1C pending   Left foot toes blue - with poorly palpable pulses - pt says he has had scans on the legs and was told nothing to do  - will monitor, I did not see any ABI on file but will ask PCP if this was done   HTN, essential  - reasonable inpatient control   Dementia - appears to be at baseline    DVT prophylaxis: Lovenox SQ Code Status:  DNR Family Communication: Patient at bedside  Disposition Plan: to be determined   Consultants:   None  Procedures:   None  Antimicrobials:   Levaquin 5/26 ->  Subjective: Pt reports feeling better.   Objective: Vitals:   07/21/16 1119 07/21/16 1457 07/21/16 1736 07/21/16 1945  BP: (!) 142/47  (!) 162/47 (!) 153/60  Pulse: 81  77 76  Resp: 20  18 19   Temp: 97.6 F (36.4 C)  97.7 F (36.5 C) 97.4 F (36.3 C)  TempSrc: Oral  Oral Oral  SpO2: 93% 95% 94% 96%  Weight:      Height:        Intake/Output Summary (Last 24 hours) at 07/21/16 2016 Last data filed at 07/21/16 1945  Gross per 24 hour  Intake              960 ml  Output              675 ml  Net              285 ml   Filed Weights   07/20/16 1244 07/21/16 0530  Weight: 97.5 kg (215 lb) 97.5 kg (215 lb)    Examination:  General exam: Appears calm and comfortable  Respiratory system: Crackles at bases  Cardiovascular system: S1 & S2 heard, RRR. No rubs,  gallops or clicks.Trace LE edema  Gastrointestinal system: Abdomen is nondistended, soft and nontender. No organomegaly or masses felt.  Central nervous system: Alert and oriented. No focal neurological deficits. Left foot toes somewhat bluish   Data Reviewed: I have personally reviewed following labs and imaging studies  CBC:  Recent Labs Lab 07/16/16 0925 07/20/16 1334  WBC 6.4 8.4  NEUTROABS 4.0  --   HGB 13.0 13.0  HCT 39.8 40.4  MCV 95.4 95.7  PLT 187 846   Basic Metabolic Panel:  Recent Labs Lab 07/20/16 1334 07/21/16 0345  NA 137 137  K 3.9 4.1  CL 106 104  CO2 20* 21*  GLUCOSE 193* 217*  BUN 21* 28*  CREATININE 1.44* 1.62*  CALCIUM 9.1 9.1   Liver Function Tests:  Recent Labs Lab 07/20/16 1334  AST 35  ALT 24  ALKPHOS 67  BILITOT 0.3  PROT 7.0  ALBUMIN 3.6   Cardiac Enzymes:  Recent Labs Lab 07/20/16 1558 07/20/16 2232 07/21/16 0345  TROPONINI <0.03 0.04* 0.04*   CBG:  Recent Labs Lab 07/21/16 0823  07/21/16 1118 07/21/16 1733  GLUCAP 175* 216* 196*   Recent Results (from the past 240 hour(s))  Blood culture (routine x 2)     Status: None (Preliminary result)   Collection Time: 07/20/16  3:45 PM  Result Value Ref Range Status   Specimen Description BLOOD RIGHT ANTECUBITAL  Final   Special Requests   Final    BOTTLES DRAWN AEROBIC AND ANAEROBIC Blood Culture results may not be optimal due to an excessive volume of blood received in culture bottles   Culture NO GROWTH < 24 HOURS  Final   Report Status PENDING  Incomplete  Blood culture (routine x 2)     Status: None (Preliminary result)   Collection Time: 07/20/16  3:58 PM  Result Value Ref Range Status   Specimen Description BLOOD RIGHT HAND  Final   Special Requests   Final    BOTTLES DRAWN AEROBIC ONLY Blood Culture adequate volume   Culture NO GROWTH < 24 HOURS  Final   Report Status PENDING  Incomplete    Radiology Studies: Ct Head Wo Contrast Result Date: 07/20/2016 Mild diffuse cortical atrophy. No acute intracranial abnormality seen.   Dg Chest Port 1 View Result Date: 07/20/2016 1. Low lung volumes without radiographic evidence of acute cardiopulmonary disease. 2. Aortic atherosclerosis.  Scheduled Meds: . enoxaparin (LOVENOX) injection  30 mg Subcutaneous Q24H  . insulin aspart  0-9 Units Subcutaneous TID WC  . ipratropium-albuterol  3 mL Nebulization Q6H  . magnesium oxide  400 mg Oral QHS  . mouth rinse  15 mL Mouth Rinse BID  . methylPREDNISolone (SOLU-MEDROL) injection  40 mg Intravenous Q12H  . metoprolol tartrate  6.25 mg Oral BID  . multivitamin with minerals  1 tablet Oral Daily  . pantoprazole  40 mg Oral Daily  . simvastatin  20 mg Oral QHS  . sodium chloride flush  3 mL Intravenous Q12H   Continuous Infusions: . levofloxacin (LEVAQUIN) IV Stopped (07/21/16 1659)    LOS: 0 days   Time spent: 20 minutes   Faye Ramsay, MD [facility] Pager [phone]  If 7PM-7AM, please  contact night-coverage [email or web address] Password [email or web address] 07/21/2016, 8:16 PM

## 2016-07-22 ENCOUNTER — Inpatient Hospital Stay (HOSPITAL_COMMUNITY): Payer: Medicare Other

## 2016-07-22 ENCOUNTER — Encounter (HOSPITAL_COMMUNITY): Payer: Self-pay | Admitting: General Practice

## 2016-07-22 DIAGNOSIS — I340 Nonrheumatic mitral (valve) insufficiency: Secondary | ICD-10-CM

## 2016-07-22 DIAGNOSIS — I2510 Atherosclerotic heart disease of native coronary artery without angina pectoris: Secondary | ICD-10-CM

## 2016-07-22 LAB — BASIC METABOLIC PANEL
Anion gap: 9 (ref 5–15)
BUN: 36 mg/dL — AB (ref 6–20)
CALCIUM: 9.5 mg/dL (ref 8.9–10.3)
CO2: 23 mmol/L (ref 22–32)
CREATININE: 1.65 mg/dL — AB (ref 0.61–1.24)
Chloride: 103 mmol/L (ref 101–111)
GFR calc Af Amer: 39 mL/min — ABNORMAL LOW (ref >60)
GFR calc non Af Amer: 34 mL/min — ABNORMAL LOW (ref >60)
GLUCOSE: 244 mg/dL — AB (ref 65–99)
Potassium: 4.6 mmol/L (ref 3.5–5.1)
Sodium: 135 mmol/L (ref 135–145)

## 2016-07-22 LAB — LACTIC ACID, PLASMA: Lactic Acid, Venous: 3 mmol/L (ref 0.5–1.9)

## 2016-07-22 LAB — GLUCOSE, CAPILLARY
GLUCOSE-CAPILLARY: 224 mg/dL — AB (ref 65–99)
GLUCOSE-CAPILLARY: 167 mg/dL — AB (ref 65–99)
GLUCOSE-CAPILLARY: 176 mg/dL — AB (ref 65–99)
Glucose-Capillary: 177 mg/dL — ABNORMAL HIGH (ref 65–99)

## 2016-07-22 LAB — CBC
HCT: 38.1 % — ABNORMAL LOW (ref 39.0–52.0)
Hemoglobin: 12.2 g/dL — ABNORMAL LOW (ref 13.0–17.0)
MCH: 30.3 pg (ref 26.0–34.0)
MCHC: 32 g/dL (ref 30.0–36.0)
MCV: 94.5 fL (ref 78.0–100.0)
PLATELETS: 188 10*3/uL (ref 150–400)
RBC: 4.03 MIL/uL — AB (ref 4.22–5.81)
RDW: 12.8 % (ref 11.5–15.5)
WBC: 16 10*3/uL — AB (ref 4.0–10.5)

## 2016-07-22 LAB — ECHOCARDIOGRAM COMPLETE
Height: 67 in
Weight: 3369.6 oz

## 2016-07-22 MED ORDER — LOSARTAN POTASSIUM 50 MG PO TABS
100.0000 mg | ORAL_TABLET | Freq: Every day | ORAL | Status: DC
  Administered 2016-07-22: 100 mg via ORAL
  Filled 2016-07-22: qty 2

## 2016-07-22 MED ORDER — ADULT MULTIVITAMIN W/MINERALS CH
1.0000 | ORAL_TABLET | Freq: Every day | ORAL | Status: DC
  Administered 2016-07-23 – 2016-07-26 (×4): 1 via ORAL
  Filled 2016-07-22 (×4): qty 1

## 2016-07-22 MED ORDER — PERFLUTREN LIPID MICROSPHERE
1.0000 mL | INTRAVENOUS | Status: AC | PRN
  Administered 2016-07-22: 2 mL via INTRAVENOUS
  Filled 2016-07-22 (×2): qty 10

## 2016-07-22 MED ORDER — METOPROLOL TARTRATE 25 MG/10 ML ORAL SUSPENSION
6.2500 mg | Freq: Two times a day (BID) | ORAL | Status: DC
  Administered 2016-07-22 – 2016-07-26 (×8): 6.25 mg via ORAL
  Filled 2016-07-22 (×9): qty 5

## 2016-07-22 MED ORDER — HYDRALAZINE HCL 20 MG/ML IJ SOLN
5.0000 mg | INTRAMUSCULAR | Status: DC | PRN
  Administered 2016-07-22: 5 mg via INTRAVENOUS
  Filled 2016-07-22: qty 1

## 2016-07-22 MED ORDER — MAGNESIUM OXIDE 400 (241.3 MG) MG PO TABS
400.0000 mg | ORAL_TABLET | Freq: Every day | ORAL | Status: DC
  Administered 2016-07-22 – 2016-07-25 (×4): 400 mg via ORAL
  Filled 2016-07-22 (×4): qty 1

## 2016-07-22 MED ORDER — LOSARTAN POTASSIUM 50 MG PO TABS
100.0000 mg | ORAL_TABLET | Freq: Every day | ORAL | Status: DC
  Administered 2016-07-23 – 2016-07-25 (×3): 100 mg via ORAL
  Filled 2016-07-22 (×3): qty 2

## 2016-07-22 MED ORDER — PANTOPRAZOLE SODIUM 40 MG PO TBEC
40.0000 mg | DELAYED_RELEASE_TABLET | Freq: Every day | ORAL | Status: DC
  Administered 2016-07-23: 40 mg via ORAL
  Filled 2016-07-22: qty 1

## 2016-07-22 MED ORDER — METHYLPREDNISOLONE SODIUM SUCC 40 MG IJ SOLR
40.0000 mg | Freq: Every day | INTRAMUSCULAR | Status: DC
  Administered 2016-07-23 – 2016-07-26 (×4): 40 mg via INTRAVENOUS
  Filled 2016-07-22 (×4): qty 1

## 2016-07-22 NOTE — Progress Notes (Signed)
  Echocardiogram 2D Echocardiogram has been performed.  Donata Clay 07/22/2016, 3:54 PM

## 2016-07-22 NOTE — Progress Notes (Signed)
Serum lactic acid is 3.0. MD notified.  Ferdinand Lango, RN

## 2016-07-22 NOTE — Progress Notes (Signed)
Patient ID: Lawrence Warner, male   DOB: 30-Nov-1918, 83 y.o.   MRN: [id number]    PROGRESS NOTE  Johnpatrick Jenny  VOH:[id number] DOB: 14-Nov-1918 DOA: 07/20/2016  PCP: Hoyt Koch, MD   Brief Narrative:  Pt is 83 yo male with known CAD, s/p CABG in 2000, HTM, BPH, CKD stage III, presented with 3-4 days duration of progressively worsening cough productive of yellow sputum, dyspnea, wheezing, poor oral intake. In ED, pt was found to be hypoxic and required 2 L oxygen via [place].   Assessment & Plan:  Acute respiratory failure with hypoxia, demand ischemia  - secondary to acute on chronic systolic heart failure, bronchopneumonia, COPD - pt reports feeling better this AM - this is the weight trend since admission: Filed Weights   07/20/16 1244 07/21/16 0530 07/22/16 0620  Weight: 97.5 kg (215 lb) 97.5 kg (215 lb) 95.5 kg (210 lb 9.6 oz)  - continue bronchodilators scheduled and as needed - pt still wheezing but says this is his baseline - CT chest confirms bronchopneumonia - continue bronchodilators scheduled and as needed - keep on Levaquin  - ECHO pending  - no need for further trop check as pt with no CP  Bronchopneumonia  - lactic acid clearing - continue Levaquin - allow BD's as noted above   CKD stage III - Cr remains stable at 1.6 - BMP in AM  ITP - resolved   DM type II with complications of nephropathy - continue SSI   Left foot toes blue - pt declined my offer for further evaluation   HTN, essential  - SBP in 170's - added hydralazine as needed   Dementia - stable    DVT prophylaxis: Lovenox SQ Code Status: DNR Family Communication: Patient at bedside  Disposition Plan: ALF in AM  Consultants:   None  Procedures:   None  Antimicrobials:   Levaquin 5/26 ->  Subjective: Pt reports feeling better but still wheezing.   Objective: Vitals:   07/22/16 0810 07/22/16 0958 07/22/16 1100 07/22/16 1626  BP: (!) 181/64 (!) 174/62  (!)  150/69  Pulse: 70  72 70  Resp: 19  19 (!) 21  Temp: 98 F (36.7 C)  98.2 F (36.8 C)   TempSrc: Oral  Oral   SpO2: 98%  99% 96%  Weight:      Height:        Intake/Output Summary (Last 24 hours) at 07/22/16 1627 Last data filed at 07/22/16 1300  Gross per 24 hour  Intake              720 ml  Output              225 ml  Net              495 ml   Filed Weights   07/20/16 1244 07/21/16 0530 07/22/16 0620  Weight: 97.5 kg (215 lb) 97.5 kg (215 lb) 95.5 kg (210 lb 9.6 oz)    Physical Exam  Constitutional: Appears well-developed and well-nourished. No distress.  Pulmonary: Exp wheezing in upper lobes, rhonchi at bases  Abdominal: Soft. BS +,  no distension, tenderness, rebound or guarding.  Musculoskeletal: Normal range of motion. Bluish toes on the left foot   Data Reviewed: I have personally reviewed following labs and imaging studies  CBC:  Recent Labs Lab 07/16/16 0925 07/20/16 1334 07/22/16 0357  WBC 6.4 8.4 16.0*  NEUTROABS 4.0  --   --   HGB 13.0 13.0 12.2*  HCT 39.8 40.4 38.1*  MCV 95.4 95.7 94.5  PLT 187 188 347   Basic Metabolic Panel:  Recent Labs Lab 07/20/16 1334 07/21/16 0345 07/22/16 0357  NA 137 137 135  K 3.9 4.1 4.6  CL 106 104 103  CO2 20* 21* 23  GLUCOSE 193* 217* 244*  BUN 21* 28* 36*  CREATININE 1.44* 1.62* 1.65*  CALCIUM 9.1 9.1 9.5   Liver Function Tests:  Recent Labs Lab 07/20/16 1334  AST 35  ALT 24  ALKPHOS 67  BILITOT 0.3  PROT 7.0  ALBUMIN 3.6   Cardiac Enzymes:  Recent Labs Lab 07/20/16 1558 07/20/16 2232 07/21/16 0345  TROPONINI <0.03 0.04* 0.04*   CBG:  Recent Labs Lab 07/21/16 1118 07/21/16 1733 07/21/16 2137 07/22/16 0725 07/22/16 1113  GLUCAP 216* 196* 176* 224* 167*   Recent Results (from the past 240 hour(s))  Blood culture (routine x 2)     Status: None (Preliminary result)   Collection Time: 07/20/16  3:45 PM  Result Value Ref Range Status   Specimen Description BLOOD RIGHT ANTECUBITAL   Final   Special Requests   Final    BOTTLES DRAWN AEROBIC AND ANAEROBIC Blood Culture results may not be optimal due to an excessive volume of blood received in culture bottles   Culture NO GROWTH 2 DAYS  Final   Report Status PENDING  Incomplete  Blood culture (routine x 2)     Status: None (Preliminary result)   Collection Time: 07/20/16  3:58 PM  Result Value Ref Range Status   Specimen Description BLOOD RIGHT HAND  Final   Special Requests   Final    BOTTLES DRAWN AEROBIC ONLY Blood Culture adequate volume   Culture NO GROWTH 2 DAYS  Final   Report Status PENDING  Incomplete    Radiology Studies: Ct Head Wo Contrast Result Date: 07/20/2016 Mild diffuse cortical atrophy. No acute intracranial abnormality seen.   Dg Chest Port 1 View Result Date: 07/20/2016 1. Low lung volumes without radiographic evidence of acute cardiopulmonary disease. 2. Aortic atherosclerosis.  Scheduled Meds: . enoxaparin (LOVENOX) injection  30 mg Subcutaneous Q24H  . insulin aspart  0-9 Units Subcutaneous TID WC  . ipratropium-albuterol  3 mL Nebulization Q6H  . [START ON 07/23/2016] losartan  100 mg Oral Q breakfast  . magnesium oxide  400 mg Oral Q supper  . mouth rinse  15 mL Mouth Rinse BID  . [START ON 07/23/2016] methylPREDNISolone (SOLU-MEDROL) injection  40 mg Intravenous Daily  . metoprolol tartrate  6.25 mg Oral BID WC  . [START ON 07/23/2016] multivitamin with minerals  1 tablet Oral Q breakfast  . [START ON 07/23/2016] pantoprazole  40 mg Oral Daily  . simvastatin  20 mg Oral QHS  . sodium chloride flush  3 mL Intravenous Q12H   Continuous Infusions: . levofloxacin (LEVAQUIN) IV Stopped (07/22/16 1528)    LOS: 1 day   Time spent: 20 minutes   Faye Ramsay, MD [facility] Pager [phone]  If 7PM-7AM, please contact night-coverage [email or web address] Password [facility] 07/22/2016, 4:27 PM

## 2016-07-22 NOTE — Progress Notes (Signed)
SATURATION QUALIFICATIONS: (This note is used to comply with regulatory documentation for home oxygen)  Patient Saturations on Room Air at Rest = 93 %  Patient Saturations on Room Air while Ambulating = 82 %  Patient Saturations on 4  Liters of oxygen while Ambulating = 92 %  Ferdinand Lango, RN

## 2016-07-22 NOTE — Plan of Care (Signed)
Problem: Safety: Goal: Ability to remain free from injury will improve Outcome: Progressing Fall risk bundle in place. No injuries or skin breakdown this shift. Bed locked in lowest position, call light in reach.

## 2016-07-23 ENCOUNTER — Ambulatory Visit: Payer: Medicare Other

## 2016-07-23 ENCOUNTER — Inpatient Hospital Stay (HOSPITAL_COMMUNITY): Payer: Medicare Other

## 2016-07-23 DIAGNOSIS — J441 Chronic obstructive pulmonary disease with (acute) exacerbation: Secondary | ICD-10-CM

## 2016-07-23 DIAGNOSIS — J9601 Acute respiratory failure with hypoxia: Principal | ICD-10-CM

## 2016-07-23 LAB — BASIC METABOLIC PANEL
Anion gap: 11 (ref 5–15)
BUN: 50 mg/dL — ABNORMAL HIGH (ref 6–20)
CHLORIDE: 103 mmol/L (ref 101–111)
CO2: 24 mmol/L (ref 22–32)
Calcium: 9.8 mg/dL (ref 8.9–10.3)
Creatinine, Ser: 1.71 mg/dL — ABNORMAL HIGH (ref 0.61–1.24)
GFR calc non Af Amer: 32 mL/min — ABNORMAL LOW (ref >60)
GFR, EST AFRICAN AMERICAN: 37 mL/min — AB (ref >60)
Glucose, Bld: 160 mg/dL — ABNORMAL HIGH (ref 65–99)
Potassium: 4.3 mmol/L (ref 3.5–5.1)
Sodium: 138 mmol/L (ref 135–145)

## 2016-07-23 LAB — GLUCOSE, CAPILLARY
GLUCOSE-CAPILLARY: 138 mg/dL — AB (ref 65–99)
GLUCOSE-CAPILLARY: 179 mg/dL — AB (ref 65–99)
GLUCOSE-CAPILLARY: 208 mg/dL — AB (ref 65–99)
Glucose-Capillary: 146 mg/dL — ABNORMAL HIGH (ref 65–99)

## 2016-07-23 LAB — CBC
HCT: 43.1 % (ref 39.0–52.0)
HEMOGLOBIN: 13.6 g/dL (ref 13.0–17.0)
MCH: 30.6 pg (ref 26.0–34.0)
MCHC: 31.6 g/dL (ref 30.0–36.0)
MCV: 96.9 fL (ref 78.0–100.0)
Platelets: 189 10*3/uL (ref 150–400)
RBC: 4.45 MIL/uL (ref 4.22–5.81)
RDW: 13.3 % (ref 11.5–15.5)
WBC: 16 10*3/uL — AB (ref 4.0–10.5)

## 2016-07-23 LAB — LACTIC ACID, PLASMA: Lactic Acid, Venous: 1.9 mmol/L (ref 0.5–1.9)

## 2016-07-23 MED ORDER — PANTOPRAZOLE SODIUM 40 MG PO TBEC
40.0000 mg | DELAYED_RELEASE_TABLET | Freq: Two times a day (BID) | ORAL | Status: DC
  Administered 2016-07-23 – 2016-07-26 (×6): 40 mg via ORAL
  Filled 2016-07-23 (×7): qty 1

## 2016-07-23 MED ORDER — FLUTICASONE PROPIONATE 50 MCG/ACT NA SUSP
2.0000 | Freq: Two times a day (BID) | NASAL | Status: DC
  Administered 2016-07-23 – 2016-07-26 (×6): 2 via NASAL
  Filled 2016-07-23: qty 16

## 2016-07-23 MED ORDER — HYDROCOD POLST-CPM POLST ER 10-8 MG/5ML PO SUER: 5.0000 mL | Freq: Two times a day (BID) | ORAL | Status: DC | PRN

## 2016-07-23 MED ORDER — LEVOFLOXACIN 500 MG PO TABS
250.0000 mg | ORAL_TABLET | Freq: Every day | ORAL | Status: DC
  Administered 2016-07-23 – 2016-07-26 (×4): 250 mg via ORAL
  Filled 2016-07-23 (×4): qty 1

## 2016-07-23 MED ORDER — FUROSEMIDE 10 MG/ML IJ SOLN
40.0000 mg | Freq: Once | INTRAMUSCULAR | Status: AC
  Administered 2016-07-23: 40 mg via INTRAVENOUS
  Filled 2016-07-23: qty 4

## 2016-07-23 MED ORDER — ROMIPLOSTIM 250 MCG ~~LOC~~ SOLR
50.0000 ug | Freq: Once | SUBCUTANEOUS | Status: AC
  Administered 2016-07-24: 50 ug via SUBCUTANEOUS
  Filled 2016-07-23: qty 0.1

## 2016-07-23 MED ORDER — MENTHOL 3 MG MT LOZG: 1.0000 | LOZENGE | OROMUCOSAL | Status: DC | PRN

## 2016-07-23 MED ORDER — AZELASTINE HCL 0.1 % NA SOLN
2.0000 | Freq: Two times a day (BID) | NASAL | Status: DC
  Administered 2016-07-23 – 2016-07-26 (×6): 2 via NASAL
  Filled 2016-07-23: qty 30

## 2016-07-23 NOTE — Progress Notes (Signed)
Patient ID: Lawrence Warner, male   DOB: 03/06/1918, 83 y.o.   MRN: [id number]    PROGRESS NOTE  Lawrence Warner  MHD:[id number] DOB: [birth year] DOA: 07/20/2016  PCP: Hoyt Koch, MD   Brief Narrative:  Pt is 83 yo male with known CAD, s/p CABG in 2000, HTM, BPH, CKD stage III, presented with 3-4 days duration of progressively worsening cough productive of yellow sputum, dyspnea, wheezing, poor oral intake. In ED, pt was found to be hypoxic and required 2 L oxygen via [place].   Major events since admission: 5/29 - persistent dyspnea, requiring NRB this AM, PCCM consulted   Assessment & Plan:  Acute respiratory failure with hypoxia - it appears to be secondary to bronchopneumonia with acute COPD exacerbation - pt was initially on Solumedrol 40 mg  BID until 5/28 when we have changed it to 40 mg IV QD and pt seems to be getting worse - I have consulted with PCCM, appreciate assistance - PCCM did not think this is CHF and I do agree this is less likely vascular congestion  - this is the weight trend since admission: Filed Weights   07/21/16 0530 07/22/16 0620 07/23/16 0415  Weight: 97.5 kg (215 lb) 95.5 kg (210 lb 9.6 oz) 94.5 kg (208 lb 4.8 oz)  - continue BD's scheduled and as needed - keep on empiric Levaquin day #4  Elevated troponin - suspect demand ischemia in the setting of acute COPD, bronchopneumonia - no chest pain, no need for further troponin cycling   CKD stage III - Cr is slightly up this am from 1.6 --> 1.7 - will repeat BMP in AM  ITP - resolved   DM type II with complications of nephropathy - reasonable control for now - on SSI   Left foot toes blue - pt declined my offer for further evaluation   HTN, essential  - SBP in 150's this AM   Dementia - appears to be at baseline    DVT prophylaxis: Lovenox SQ Code Status: DNR Family Communication: Patient at bedside  Disposition Plan: to be determined, once medically stable will go to ALF    Consultants:   PCCM  Procedures:   none  Antimicrobials:   Levaquin 5/26 ->  Subjective: Pt with more wheezing this AM, needs NRB this AM.  Objective: Vitals:   07/23/16 0329 07/23/16 0415 07/23/16 1350 07/23/16 1510  BP: (!) 173/64  (!) 151/65   Pulse: 69     Resp: (!) 22     Temp:  98 F (36.7 C) 97.6 F (36.4 C)   TempSrc:  Oral Oral   SpO2: 95%  96% 96%  Weight:  94.5 kg (208 lb 4.8 oz)    Height:        Intake/Output Summary (Last 24 hours) at 07/23/16 1636 Last data filed at 07/23/16 1400  Gross per 24 hour  Intake              240 ml  Output             3125 ml  Net            -2885 ml   Filed Weights   07/21/16 0530 07/22/16 0620 07/23/16 0415  Weight: 97.5 kg (215 lb) 95.5 kg (210 lb 9.6 oz) 94.5 kg (208 lb 4.8 oz)   Physical Exam  Constitutional: Appears in mild distress due to dyspnea  CVS: RRR, S1/S2 +, no murmurs, no gallops, no carotid bruit.  Pulmonary: Course breath  sounds with exp wheezing and rhonchi at bases  Abdominal: Soft. BS +,  no distension, tenderness, rebound or guarding.  Musculoskeletal: Normal range of motion. No edema and no tenderness.   Data Reviewed: I have personally reviewed following labs and imaging studies  CBC:  Recent Labs Lab 07/20/16 1334 07/22/16 0357 07/23/16 0613  WBC 8.4 16.0* 16.0*  HGB 13.0 12.2* 13.6  HCT 40.4 38.1* 43.1  MCV 95.7 94.5 96.9  PLT 188 188 710   Basic Metabolic Panel:  Recent Labs Lab 07/20/16 1334 07/21/16 0345 07/22/16 0357 07/23/16 0613  NA 137 137 135 138  K 3.9 4.1 4.6 4.3  CL 106 104 103 103  CO2 20* 21* 23 24  GLUCOSE 193* 217* 244* 160*  BUN 21* 28* 36* 50*  CREATININE 1.44* 1.62* 1.65* 1.71*  CALCIUM 9.1 9.1 9.5 9.8   Liver Function Tests:  Recent Labs Lab 07/20/16 1334  AST 35  ALT 24  ALKPHOS 67  BILITOT 0.3  PROT 7.0  ALBUMIN 3.6   Cardiac Enzymes:  Recent Labs Lab 07/20/16 1558 07/20/16 2232 07/21/16 0345  TROPONINI <0.03 0.04* 0.04*    CBG:  Recent Labs Lab 07/22/16 1625 07/22/16 2109 07/23/16 0730 07/23/16 1129 07/23/16 1608  GLUCAP 176* 177* 138* 179* 208*   Recent Results (from the past 240 hour(s))  Blood culture (routine x 2)     Status: None (Preliminary result)   Collection Time: 07/20/16  3:45 PM  Result Value Ref Range Status   Specimen Description BLOOD RIGHT ANTECUBITAL  Final   Special Requests   Final    BOTTLES DRAWN AEROBIC AND ANAEROBIC Blood Culture results may not be optimal due to an excessive volume of blood received in culture bottles   Culture NO GROWTH 3 DAYS  Final   Report Status PENDING  Incomplete  Blood culture (routine x 2)     Status: None (Preliminary result)   Collection Time: 07/20/16  3:58 PM  Result Value Ref Range Status   Specimen Description BLOOD RIGHT HAND  Final   Special Requests   Final    BOTTLES DRAWN AEROBIC ONLY Blood Culture adequate volume   Culture NO GROWTH 3 DAYS  Final   Report Status PENDING  Incomplete    Radiology Studies: Ct Head Wo Contrast Result Date: 07/20/2016 Mild diffuse cortical atrophy. No acute intracranial abnormality seen.   Dg Chest Port 1 View Result Date: 07/20/2016 1. Low lung volumes without radiographic evidence of acute cardiopulmonary disease. 2. Aortic atherosclerosis.  Scheduled Meds: . azelastine  2 spray Each Nare BID  . enoxaparin (LOVENOX) injection  30 mg Subcutaneous Q24H  . fluticasone  2 spray Each Nare BID  . furosemide  40 mg Intravenous Once  . insulin aspart  0-9 Units Subcutaneous TID WC  . ipratropium-albuterol  3 mL Nebulization Q6H  . levofloxacin  250 mg Oral Daily  . losartan  100 mg Oral Q breakfast  . magnesium oxide  400 mg Oral Q supper  . mouth rinse  15 mL Mouth Rinse BID  . methylPREDNISolone (SOLU-MEDROL) injection  40 mg Intravenous Daily  . metoprolol tartrate  6.25 mg Oral BID WC  . multivitamin with minerals  1 tablet Oral Q breakfast  . pantoprazole  40 mg Oral BID  . simvastatin  20  mg Oral QHS  . sodium chloride flush  3 mL Intravenous Q12H   Continuous Infusions:   LOS: 2 days   Time spent: 35 minutes   [employer],  MD [facility] Pager [phone]  If 7PM-7AM, please contact night-coverage [email or web address] Password [facility] 07/23/2016, 4:36 PM

## 2016-07-23 NOTE — Plan of Care (Signed)
Problem: Tissue Perfusion: Goal: Risk factors for ineffective tissue perfusion will decrease Outcome: Progressing Increased crackles and wheezing throughout lungs. PRN breathing treatment given. Respiratory called to assess pt as well. On call Dr notified and Lasix requested. Pt currently at 94% O2 on Venturi mask. Will continue to monitor.

## 2016-07-23 NOTE — Consult Note (Signed)
Name: Jazmin Vensel MRN: [id number] DOB: [birth year]    ADMISSION DATE:  07/20/2016 CONSULTATION DATE:  5/29  REFERRING MD :  Doyle Askew   CHIEF COMPLAINT:    BRIEF PATIENT DESCRIPTION:  This is a 83 year old male w/ known h/o CAD. Presented on 5/26 w/ 3-4 d history of worsening cough, productive of yellow sputum, decreased appetite, wheezing and shortness of breath. He was admitted w/ working dx of CAP, COPD exacerbation, complicated by demand ischemia and acute on chronic HF. He was treated w/ IV antibiotics, supplemental oxygen & inhaled BDs as well as lasix. His dyspnea improved w/ these measures but continued to wheeze and demonstrate decreased pulse ox w/ ambulation as low as mid 80s. PCCM was asked to eval given persistent oxygen needs.   SIGNIFICANT EVENTS    STUDIES:  CT chest 5/28: There is no pleural effusion. Mild diffuse central airway thickening with patchy peribronchial ground-glass densities, greatest in the right lower lobe. There is no consolidation or suspicious pulmonary nodule. ECHO 5/28 Left ventricle: The cavity size was normal. Wall thickness was increased in a pattern of moderate LVH. Systolic function was normal. The estimated ejection fraction was in the range of 60% to 65%. Wall motion was normal; there were no regional wall motion abnormalities. - Mitral valve: There was mild regurgitation. - Left atrium: The atrium was severely dilated. - Right atrium: The atrium was mildly dilated.  HISTORY OF PRESENT ILLNESS:  See above   PAST MEDICAL HISTORY :   has a past medical history of BPH (benign prostatic hypertrophy); Chronic renal insufficiency; Coronary artery disease; Diabetes type 2, controlled ([place]); Diabetic neuropathy ([place]); Dyslipidemia; Hypertension; ITP (idiopathic thrombocytopenic purpura); and Osteoarthritis.  has a past surgical history that includes Cholecystectomy; Tonsillectomy; Right hip replacement (2014); and Coronary artery bypass graft  (2000). Prior to Admission medications   Medication Sig Start Date End Date Taking? Authorizing Provider  acetaminophen (TYLENOL) 500 MG tablet Take 250-1,000 mg by mouth every 6 (six) hours as needed for mild pain, moderate pain or headache.    Yes [provider]  losartan (COZAAR) 100 MG tablet Take 100 mg by mouth daily.   Yes [provider]  magnesium oxide (MAG-OX) 400 MG tablet Take 400 mg by mouth at bedtime.   Yes [provider]  metoprolol tartrate (LOPRESSOR) 25 MG tablet Take 0.5 tablets (12.5 mg total) by mouth 2 (two) times daily. Patient taking differently: Take 6.25 mg by mouth 2 (two) times daily.  10/09/15  Yes Hoyt Koch, MD  Multiple Vitamin (MULTIVITAMIN WITH MINERALS) TABS tablet Take 1 tablet by mouth daily.   Yes [provider]  nitroGLYCERIN (NITROSTAT) 0.4 MG SL tablet Place 1 tablet (0.4 mg total) under the tongue every 5 (five) minutes as needed for chest pain. 12/13/15 07/20/16 Yes Minus Breeding, MD  omeprazole (PRILOSEC) 20 MG capsule Take 20 mg by mouth daily.   Yes [provider]  simvastatin (ZOCOR) 20 MG tablet Take 20 mg by mouth at bedtime.    Yes [provider]  hydroxypropyl methylcellulose / hypromellose (ISOPTO TEARS / GONIOVISC) 2.5 % ophthalmic solution Place 1 drop into both eyes 3 (three) times daily as needed for dry eyes.    [provider]  magnesium chloride (SLOW-MAG) 64 MG TBEC SR tablet Take 1 tablet (64 mg total) by mouth daily. Patient not taking: Reported on 07/20/2016 04/25/15   [name], MD   Allergies  Allergen Reactions  . Alfuzosin Hcl Er  Other (See Comments)    confusion  . Penicillins Hives and Other (See Comments)    Has patient had a PCN reaction causing immediate rash, facial/tongue/throat swelling, SOB or lightheadedness with hypotension: No Has patient had a PCN reaction causing severe rash involving mucus membranes or skin necrosis: No Has  patient had a PCN reaction that required hospitalization No Has patient had a PCN reaction occurring within the last 10 years: No If all of the above answers are "NO", then may proceed with Cephalosporin use.    FAMILY HISTORY:  family history includes Cancer in his brother; Lung disease in his father. SOCIAL HISTORY:  reports that he has quit smoking. He has a 50.00 pack-year smoking history. He has never used smokeless tobacco. He reports that he does not drink alcohol or use drugs.  REVIEW OF SYSTEMS:   Constitutional: Negative for fever, chills, weight loss, malaise/fatigue and diaphoresis.  HENT: Negative for hearing loss, ear pain, nosebleeds, congestion, sore throat, neck pain, tinnitus and ear discharge.  marked post-nasal gtt. Cough and PND magnified when-ever he is supine Eyes: Negative for blurred vision, double vision, photophobia, pain, discharge and redness.  Respiratory: + cough, hemoptysis, sputum production was yellow now minimal, shortness of breath, wheezing and stridor.   Cardiovascular: Negative for chest pain, palpitations, orthopnea, claudication, leg swelling and PND.  Gastrointestinal: Negative for heartburn, nausea, vomiting, abdominal pain, diarrhea, constipation, blood in stool and melena.  Genitourinary: Negative for dysuria, urgency, frequency, hematuria and flank pain.  Musculoskeletal: Negative for myalgias, back pain, joint pain and falls.  Skin: Negative for itching and rash.  Neurological: Negative for dizziness, tingling, tremors, sensory change, speech change, focal weakness, seizures, loss of consciousness, weakness and headaches.  Endo/Heme/Allergies: Negative for environmental allergies and polydipsia. Does not bruise/bleed easily.  SUBJECTIVE:  Feels better VITAL SIGNS: Temp:  [97.7 F (36.5 C)-98 F (36.7 C)] 98 F (36.7 C) (05/29 0415) Pulse Rate:  [68-92] 69 (05/29 0329) Resp:  [20-22] 22 (05/29 0329) BP: (129-173)/(54-98) 173/64 (05/29  0329) SpO2:  [95 %-98 %] 95 % (05/29 0329) FiO2 (%):  [31 %] 31 % (05/28 2110) Weight:  [208 lb 4.8 oz (94.5 kg)] 208 lb 4.8 oz (94.5 kg) (05/29 0415)  PHYSICAL EXAMINATION: General:  Elderly 83 year old male. Sitting upright in chair. No distress but does have loose garbled sounding phonation Neuro:  Awake, alert, no focal def  HEENT:  NCAT. No JVD. + upper airway rhonchi and wheeze  Cardiovascular:  Distant S1S2 no clear murmur Lungs:  Diffuse rhonchi, exp wheeze + tactile frem  Abdomen:  Soft, not tender + bowel sounds Musculoskeletal:  Equal st and bulk  Skin:  Warm and dry    Recent Labs Lab 07/21/16 0345 07/22/16 0357 07/23/16 0613  NA 137 135 138  K 4.1 4.6 4.3  CL 104 103 103  CO2 21* 23 24  BUN 28* 36* 50*  CREATININE 1.62* 1.65* 1.71*  GLUCOSE 217* 244* 160*    Recent Labs Lab 07/20/16 1334 07/22/16 0357 07/23/16 0613  HGB 13.0 12.2* 13.6  HCT 40.4 38.1* 43.1  WBC 8.4 16.0* 16.0*  PLT 188 188 189   Ct Chest Wo Contrast  Result Date: 07/22/2016 CLINICAL DATA:  Progressive productive cough with shortness of breath and wheezing. EXAM: CT CHEST WITHOUT CONTRAST TECHNIQUE: Multidetector CT imaging of the chest was performed following the standard protocol without IV contrast. COMPARISON:  Radiographs 07/20/2016 and 02/28/2016. FINDINGS: Cardiovascular: There is atherosclerosis of the aorta, great vessels and  coronary arteries status post median sternotomy and CABG. No acute vascular findings are seen on noncontrast imaging. The heart size is normal. There is no pericardial effusion. Mediastinum/Nodes: There are no enlarged mediastinal, hilar or axillary lymph nodes.Small calcified right hilar nodes are noted. The thyroid gland, trachea and esophagus demonstrate no significant findings. Lungs/Pleura: There is no pleural effusion. Mild diffuse central airway thickening with patchy peribronchial ground-glass densities, greatest in the right lower lobe. There is no  consolidation or suspicious pulmonary nodule. Upper abdomen: Hepatic low density consistent with steatosis. There are scattered small calcified granulomas in the liver and spleen. Probable small cyst posteriorly in the mid left kidney. Musculoskeletal/Chest wall: No acute osseous findings are seen. There is diffuse ankylosis of the cervicothoracic spine consistent with idiopathic skeletal hyperostosis. There is a large posterior disc osteophyte complex at C2-3, asymmetric to the left. This may contribute to mass effect on the cord at this level. IMPRESSION: 1. Central airway thickening with patchy peribronchial opacities in both lungs, likely early bronchopneumonia or atypical infection. No consolidation. 2. Diffuse atherosclerosis post CABG. 3. Diffuse idiopathic skeletal hyperostosis with large posterior disc osteophyte complex at C2-3 causing canal compromise. Electronically Signed   By: Richardean Sale M.D.   On: 07/22/2016 10:35   Dg Chest Port 1 View  Result Date: 07/23/2016 CLINICAL DATA:  83 year old male with shortness of breath. EXAM: PORTABLE CHEST 1 VIEW COMPARISON:  Chest CT dated 07/22/2016 FINDINGS: Single portable view of the chest demonstrates minimal bibasilar densities, likely atelectatic changes. There is no focal consolidation, pleural effusion, or pneumothorax. Top-normal cardiac size. Median sternotomy wires and CABG vascular clips. No acute osseous pathology. IMPRESSION: No focal consolidation. Electronically Signed   By: Anner Crete M.D.   On: 07/23/2016 03:42  PCXR personally reviewed no consolidations but does have R>L airspace disease.  ASSESSMENT / PLAN:  Acute hypoxic respiratory failure AECOPD (presumptive) w/ acute purulent bronchitis +/- CAP (NOS) Post nasal gtt Cough GERD Acute on chronic diastolic heart failure Demand ischemia Pulmonary edema CRI H/o CAD DM  Acute hypoxic respiratory failure in setting of what is likely AECOPD +/- CAP (NOS) vs acute  bronchitis, complicated by persistent post-nasal gtt, cough and upper airway irritation as well as volume excess (from acute on chronic diastolic HF).  -his cough and wheezing are his major complaints. Otherwise he is actually feeling better. He reports these symptoms are at their worst when he is in the supine position.  Plan Repeat lasix X 1 Cont inhaled BDs Cont supplemental oxygen  Change solumedrol to q12 Cont levaquin day 3/5 Add nasal steroid, Oral antihistamine and increase PPI Would benefit from PFTs at some point as states he typically has activity limitations d/t shortness of breath   07/23/2016, 1:42 PM

## 2016-07-23 NOTE — Progress Notes (Addendum)
Physical Therapy Treatment Patient Details Name: Lawrence Warner MRN: [id number] DOB: 01-Feb-1919 Today's Date: 07/23/2016    History of Present Illness Lawrence Warner is a 83 y.o. male with medical history significant of CAD, s/p CABG IN 2000, DM, hypertension, ITP, BPH, stage 3 CKD, osteoarthritis, is here for sob, chills, cough with productive sputum, wheezing. Admitted for acute respiratory failure.    PT Comments    Pt making good progress. Pt continues to desaturate on RA with activity (SpO2 86%). SpO2 92% at rest on RA initially but decr to 87% so O2 replaced at rest.  Follow Up Recommendations  Home health PT     Equipment Recommendations  None recommended by PT    Recommendations for Other Services       Precautions / Restrictions Precautions Precautions: Fall Precaution Comments: Monitor O2 Restrictions Weight Bearing Restrictions: No    Mobility  Bed Mobility Overal bed mobility: Modified Independent                Transfers Overall transfer level: Modified independent Equipment used: None Transfers: Sit to/from Stand Sit to Stand: Modified independent (Device/Increase time)            Ambulation/Gait Ambulation/Gait assistance: Supervision Ambulation Distance (Feet): 200 Feet Assistive device: Rolling walker (2 wheeled) Gait Pattern/deviations: Step-through pattern;Decreased stride length Gait velocity: slow Gait velocity interpretation: <1.8 ft/sec, indicative of risk for recurrent falls General Gait Details: Assist for safety. Pt amb on RA with SpO2 dropping to 86%. Replaced O2 via face mask (pt is mouth breather) and SpO2 returned to 92%.   Stairs            Wheelchair Mobility    Modified Rankin (Stroke Patients Only)       Balance Overall balance assessment: Needs assistance Sitting-balance support: No upper extremity supported Sitting balance-Leahy Scale: Normal     Standing balance support: No upper extremity  supported;During functional activity Standing balance-Leahy Scale: Good                              Cognition Arousal/Alertness: Awake/alert Behavior During Therapy: WFL for tasks assessed/performed Overall Cognitive Status: Within Functional Limits for tasks assessed                                        Exercises      General Comments        Pertinent Vitals/Pain Pain Assessment: No/denies pain    Home Living       Type of Home: Independent living facility [facility])       Home Equipment: Environmental consultant - 4 wheels;Cane - single point;Grab bars - toilet;Electric scooter;Shower seat - built in      Prior Function            PT Goals (current goals can now be found in the care plan section) Progress towards PT goals: Progressing toward goals    Frequency    Min 3X/week      PT Plan Current plan remains appropriate    Co-evaluation              AM-PAC PT "6 Clicks" Daily Activity  Outcome Measure  Difficulty turning over in bed (including adjusting bedclothes, sheets and blankets)?: None Difficulty moving from lying on back to sitting on the side of the bed? : None Difficulty sitting down on  and standing up from a chair with arms (e.g., wheelchair, bedside commode, etc,.)?: None Help needed moving to and from a bed to chair (including a wheelchair)?: None Help needed walking in hospital room?: A Little Help needed climbing 3-5 steps with a railing? : A Little 6 Click Score: 22    End of Session Equipment Utilized During Treatment: Oxygen Activity Tolerance: Patient tolerated treatment well Patient left: in chair;with call bell/phone within reach Nurse Communication: Mobility status PT Visit Diagnosis: Muscle weakness (generalized) (M62.81);Other abnormalities of gait and mobility (R26.89)     Time: 8648-4720 PT Time Calculation (min) (ACUTE ONLY): 20 min  Charges:  $Gait Training: 8-22 mins                     G Codes:       [facility] PT [place] 07/23/2016, 11:45 AM

## 2016-07-23 NOTE — Progress Notes (Signed)
SATURATION QUALIFICATIONS: (This note is used to comply with regulatory documentation for home oxygen)  Patient Saturations on Room Air at Rest = 92%  Patient Saturations on Room Air while Ambulating = 86%  Patient Saturations on 4 Liters of oxygen while Ambulating = 92%  Please briefly explain why patient needs home oxygen: Pt unable to maintain adequate O2 levels on RA with activity.  [employer] PT [phone]

## 2016-07-24 DIAGNOSIS — J40 Bronchitis, not specified as acute or chronic: Secondary | ICD-10-CM

## 2016-07-24 DIAGNOSIS — R0600 Dyspnea, unspecified: Secondary | ICD-10-CM

## 2016-07-24 DIAGNOSIS — R0602 Shortness of breath: Secondary | ICD-10-CM

## 2016-07-24 LAB — GLUCOSE, CAPILLARY
GLUCOSE-CAPILLARY: 149 mg/dL — AB (ref 65–99)
GLUCOSE-CAPILLARY: 266 mg/dL — AB (ref 65–99)
Glucose-Capillary: 293 mg/dL — ABNORMAL HIGH (ref 65–99)
Glucose-Capillary: 147 mg/dL — ABNORMAL HIGH (ref 65–99)

## 2016-07-24 LAB — CBC
HEMATOCRIT: 41.1 % (ref 39.0–52.0)
HEMOGLOBIN: 12.9 g/dL — AB (ref 13.0–17.0)
MCH: 30.6 pg (ref 26.0–34.0)
MCHC: 31.4 g/dL (ref 30.0–36.0)
MCV: 97.4 fL (ref 78.0–100.0)
Platelets: 171 10*3/uL (ref 150–400)
RBC: 4.22 MIL/uL (ref 4.22–5.81)
RDW: 13.4 % (ref 11.5–15.5)
WBC: 12.1 10*3/uL — ABNORMAL HIGH (ref 4.0–10.5)

## 2016-07-24 LAB — PROCALCITONIN

## 2016-07-24 LAB — HEMOGLOBIN A1C
Hgb A1c MFr Bld: 7.6 % — ABNORMAL HIGH (ref 4.8–5.6)
Mean Plasma Glucose: 171 mg/dL

## 2016-07-24 LAB — BASIC METABOLIC PANEL
ANION GAP: 9 (ref 5–15)
BUN: 62 mg/dL — ABNORMAL HIGH (ref 6–20)
CALCIUM: 9.4 mg/dL (ref 8.9–10.3)
CO2: 26 mmol/L (ref 22–32)
Chloride: 102 mmol/L (ref 101–111)
Creatinine, Ser: 2.02 mg/dL — ABNORMAL HIGH (ref 0.61–1.24)
GFR calc Af Amer: 31 mL/min — ABNORMAL LOW (ref >60)
GFR, EST NON AFRICAN AMERICAN: 26 mL/min — AB (ref >60)
Glucose, Bld: 161 mg/dL — ABNORMAL HIGH (ref 65–99)
Potassium: 4.4 mmol/L (ref 3.5–5.1)
Sodium: 137 mmol/L (ref 135–145)

## 2016-07-24 MED ORDER — GUAIFENESIN ER 600 MG PO TB12
600.0000 mg | ORAL_TABLET | Freq: Two times a day (BID) | ORAL | Status: DC
  Administered 2016-07-24 – 2016-07-26 (×5): 600 mg via ORAL
  Filled 2016-07-24 (×5): qty 1

## 2016-07-24 NOTE — Progress Notes (Addendum)
Patient ID: Lawrence Warner, male   DOB: 08-13-1918, 83 y.o.   MRN: [id number]    PROGRESS NOTE  Lawrence Warner  JJK:[id number] DOB: 1918-10-04 DOA: 07/20/2016  PCP: Hoyt Koch, MD   Brief Narrative:  Pt is 83 yo male with known CAD, s/p CABG in 2000, HTM, BPH, CKD stage III, presented with 3-4 days duration of progressively worsening cough productive of yellow sputum, dyspnea, wheezing, poor oral intake. In ED, pt was found to be hypoxic and required 2 L oxygen via [place].   Major events since admission: 5/29 - persistent dyspnea, requiring NRB this AM, PCCM consulted    Assessment & Plan:  Acute respiratory failure with hypoxia - it appears to be secondary to bronchopneumonia with acute COPD exacerbation - PCCM folowing -nebs -abx: Levaquin -add mucinex and chest PT -steroids -respiratory panel pending -procalcitonin pending  Elevated troponin - suspect demand ischemia in the setting of acute COPD, bronchopneumonia - no chest pain, no need for further troponin cycling   CKD stage III - Cr is slightly up this am from 1.6 --> 1.7 - follow  ITP - resolved   DM type II with complications of nephropathy - reasonable control for now - on SSI   Left foot toes blue - pt declined Dr. Adella Hare offer for further evaluation   HTN, essential  - SBP in 150's this AM   Dementia - appears to be at baseline   breathing status tenuous will require close monitoring  DVT prophylaxis: Lovenox SQ Code Status: DNR Family Communication: Patient  Disposition Plan: to be determined, once medically stable will go to ALF   Consultants:   PCCM  Procedures:   none  Antimicrobials:   Levaquin 5/26 ->  Subjective: Per nursing had to turn O2 up-- more congested today  Objective: Vitals:   07/23/16 1510 07/23/16 2059 07/24/16 0555 07/24/16 0815  BP:  (!) 136/55 106/69   Pulse:  65 71   Resp:  (!) 21 20   Temp:  98.3 F (36.8 C) 98.1 F (36.7 C)   TempSrc:   Axillary Axillary   SpO2: 96% 95% 97% 98%  Weight:   93.8 kg (206 lb 12.8 oz)   Height:        Intake/Output Summary (Last 24 hours) at 07/24/16 1219 Last data filed at 07/24/16 0700  Gross per 24 hour  Intake              350 ml  Output             1925 ml  Net            -1575 ml   Filed Weights   07/22/16 0620 07/23/16 0415 07/24/16 0555  Weight: 95.5 kg (210 lb 9.6 oz) 94.5 kg (208 lb 4.8 oz) 93.8 kg (206 lb 12.8 oz)   Physical Exam  Constitutional: wearing NRB CVS: rrr Pulmonary: coarse BS with wheezing Abdominal: +BS, soft  Musculoskeletal: moves all extremities  Data Reviewed: I have personally reviewed following labs and imaging studies  CBC:  Recent Labs Lab 07/20/16 1334 07/22/16 0357 07/23/16 0613 07/24/16 0444  WBC 8.4 16.0* 16.0* 12.1*  HGB 13.0 12.2* 13.6 12.9*  HCT 40.4 38.1* 43.1 41.1  MCV 95.7 94.5 96.9 97.4  PLT 188 188 189 371   Basic Metabolic Panel:  Recent Labs Lab 07/20/16 1334 07/21/16 0345 07/22/16 0357 07/23/16 0613 07/24/16 0444  NA 137 137 135 138 137  K 3.9 4.1 4.6 4.3 4.4  CL 106  104 103 103 102  CO2 20* 21* 23 24 26   GLUCOSE 193* 217* 244* 160* 161*  BUN 21* 28* 36* 50* 62*  CREATININE 1.44* 1.62* 1.65* 1.71* 2.02*  CALCIUM 9.1 9.1 9.5 9.8 9.4   Liver Function Tests:  Recent Labs Lab 07/20/16 1334  AST 35  ALT 24  ALKPHOS 67  BILITOT 0.3  PROT 7.0  ALBUMIN 3.6   Cardiac Enzymes:  Recent Labs Lab 07/20/16 1558 07/20/16 2232 07/21/16 0345  TROPONINI <0.03 0.04* 0.04*   CBG:  Recent Labs Lab 07/23/16 1129 07/23/16 1608 07/23/16 2108 07/24/16 0723 07/24/16 1134  GLUCAP 179* 208* 146* 149* 266*   Recent Results (from the past 240 hour(s))  Blood culture (routine x 2)     Status: None (Preliminary result)   Collection Time: 07/20/16  3:45 PM  Result Value Ref Range Status   Specimen Description BLOOD RIGHT ANTECUBITAL  Final   Special Requests   Final    BOTTLES DRAWN AEROBIC AND ANAEROBIC Blood  Culture results may not be optimal due to an excessive volume of blood received in culture bottles   Culture NO GROWTH 3 DAYS  Final   Report Status PENDING  Incomplete  Blood culture (routine x 2)     Status: None (Preliminary result)   Collection Time: 07/20/16  3:58 PM  Result Value Ref Range Status   Specimen Description BLOOD RIGHT HAND  Final   Special Requests   Final    BOTTLES DRAWN AEROBIC ONLY Blood Culture adequate volume   Culture NO GROWTH 3 DAYS  Final   Report Status PENDING  Incomplete    Radiology Studies: Ct Head Wo Contrast Result Date: 07/20/2016 Mild diffuse cortical atrophy. No acute intracranial abnormality seen.   Dg Chest Port 1 View Result Date: 07/20/2016 1. Low lung volumes without radiographic evidence of acute cardiopulmonary disease. 2. Aortic atherosclerosis.  Scheduled Meds: . azelastine  2 spray Each Nare BID  . enoxaparin (LOVENOX) injection  30 mg Subcutaneous Q24H  . fluticasone  2 spray Each Nare BID  . guaiFENesin  600 mg Oral BID  . insulin aspart  0-9 Units Subcutaneous TID WC  . ipratropium-albuterol  3 mL Nebulization Q6H  . levofloxacin  250 mg Oral Daily  . losartan  100 mg Oral Q breakfast  . magnesium oxide  400 mg Oral Q supper  . mouth rinse  15 mL Mouth Rinse BID  . methylPREDNISolone (SOLU-MEDROL) injection  40 mg Intravenous Daily  . metoprolol tartrate  6.25 mg Oral BID WC  . multivitamin with minerals  1 tablet Oral Q breakfast  . pantoprazole  40 mg Oral BID  . simvastatin  20 mg Oral QHS  . sodium chloride flush  3 mL Intravenous Q12H   Continuous Infusions:   LOS: 3 days   Time spent: 35 minutes   Cameron, DO [facility] Pager [phone]  If 7PM-7AM, please contact night-coverage [email or web address] Password [email or web address] 07/24/2016, 12:19 PM

## 2016-07-24 NOTE — Progress Notes (Signed)
Name: Beauregard Jarrells MRN: [id number] DOB: [birth year]    ADMISSION DATE:  07/20/2016 CONSULTATION DATE:  5/29  REFERRING MD :  Doyle Askew   CHIEF COMPLAINT:    BRIEF PATIENT DESCRIPTION:  This is a 83 year old male w/ known h/o CAD. Presented on 5/26 w/ 3-4 d history of worsening cough, productive of yellow sputum, decreased appetite, wheezing and shortness of breath. He was admitted w/ working dx of CAP, COPD exacerbation, complicated by demand ischemia and acute on chronic HF. He was treated w/ IV antibiotics, supplemental oxygen & inhaled BDs as well as lasix. His dyspnea improved w/ these measures but continued to wheeze and demonstrate decreased pulse ox w/ ambulation as low as mid 80s. PCCM was asked to eval given persistent oxygen needs.   SIGNIFICANT EVENTS    STUDIES:  CT chest 5/28: There is no pleural effusion. Mild diffuse central airway thickening with patchy peribronchial ground-glass densities, greatest in the right lower lobe. There is no consolidation or suspicious pulmonary nodule. ECHO 5/28 Left ventricle: The cavity size was normal. Wall thickness was increased in a pattern of moderate LVH. Systolic function was normal. The estimated ejection fraction was in the range of 60% to 65%. Wall motion was normal; there were no regional wall motion abnormalities. - Mitral valve: There was mild regurgitation. - Left atrium: The atrium was severely dilated. - Right atrium: The atrium was mildly dilated.  HISTORY OF PRESENT ILLNESS:  See above     SUBJECTIVE:  Co ha , sleepy VITAL SIGNS: Temp:  [97.6 F (36.4 C)-98.3 F (36.8 C)] 98.1 F (36.7 C) (05/30 0555) Pulse Rate:  [65-71] 71 (05/30 0555) Resp:  [20-21] 20 (05/30 0555) BP: (106-151)/(55-69) 106/69 (05/30 0555) SpO2:  [95 %-98 %] 98 % (05/30 0815) FiO2 (%):  [31 %] 31 % (05/30 0815) Weight:  [206 lb 12.8 oz (93.8 kg)] 206 lb 12.8 oz (93.8 kg) (05/30 0555)  PHYSICAL EXAMINATION: General:  Ill appearing elderly  male, sleepy, co ha HEENT: Oral mucosa dry, no jvd/lan LOV:FIEP affect Neuro: Sleepy but moves all ext, follows commands CV: HSD PULM: Shallow resp, no wheeze PI:RJJO, non-tender, bsx4 active  Extremities: warm/dry, +edema  Skin: no rashes or lesions     Recent Labs Lab 07/22/16 0357 07/23/16 0613 07/24/16 0444  NA 135 138 137  K 4.6 4.3 4.4  CL 103 103 102  CO2 23 24 26   BUN 36* 50* 62*  CREATININE 1.65* 1.71* 2.02*  GLUCOSE 244* 160* 161*    Recent Labs Lab 07/22/16 0357 07/23/16 0613 07/24/16 0444  HGB 12.2* 13.6 12.9*  HCT 38.1* 43.1 41.1  WBC 16.0* 16.0* 12.1*  PLT 188 189 171   Ct Chest Wo Contrast  Result Date: 07/22/2016 CLINICAL DATA:  Progressive productive cough with shortness of breath and wheezing. EXAM: CT CHEST WITHOUT CONTRAST TECHNIQUE: Multidetector CT imaging of the chest was performed following the standard protocol without IV contrast. COMPARISON:  Radiographs 07/20/2016 and 02/28/2016. FINDINGS: Cardiovascular: There is atherosclerosis of the aorta, great vessels and coronary arteries status post median sternotomy and CABG. No acute vascular findings are seen on noncontrast imaging. The heart size is normal. There is no pericardial effusion. Mediastinum/Nodes: There are no enlarged mediastinal, hilar or axillary lymph nodes.Small calcified right hilar nodes are noted. The thyroid gland, trachea and esophagus demonstrate no significant findings. Lungs/Pleura: There is no pleural effusion. Mild diffuse central airway thickening with patchy peribronchial ground-glass densities, greatest in the right lower lobe. There is no consolidation  or suspicious pulmonary nodule. Upper abdomen: Hepatic low density consistent with steatosis. There are scattered small calcified granulomas in the liver and spleen. Probable small cyst posteriorly in the mid left kidney. Musculoskeletal/Chest wall: No acute osseous findings are seen. There is diffuse ankylosis of the  cervicothoracic spine consistent with idiopathic skeletal hyperostosis. There is a large posterior disc osteophyte complex at C2-3, asymmetric to the left. This may contribute to mass effect on the cord at this level. IMPRESSION: 1. Central airway thickening with patchy peribronchial opacities in both lungs, likely early bronchopneumonia or atypical infection. No consolidation. 2. Diffuse atherosclerosis post CABG. 3. Diffuse idiopathic skeletal hyperostosis with large posterior disc osteophyte complex at C2-3 causing canal compromise. Electronically Signed   By: Richardean Sale M.D.   On: 07/22/2016 10:35   Dg Chest Port 1 View  Result Date: 07/23/2016 CLINICAL DATA:  83 year old male with shortness of breath. EXAM: PORTABLE CHEST 1 VIEW COMPARISON:  Chest CT dated 07/22/2016 FINDINGS: Single portable view of the chest demonstrates minimal bibasilar densities, likely atelectatic changes. There is no focal consolidation, pleural effusion, or pneumothorax. Top-normal cardiac size. Median sternotomy wires and CABG vascular clips. No acute osseous pathology. IMPRESSION: No focal consolidation. Electronically Signed   By: Anner Crete M.D.   On: 07/23/2016 03:42     Intake/Output Summary (Last 24 hours) at 07/24/16 0933 Last data filed at 07/24/16 0700  Gross per 24 hour  Intake              350 ml  Output             1925 ml  Net            -1575 ml   ASSESSMENT / PLAN:  Acute hypoxic respiratory failure AECOPD (presumptive) w/ acute purulent bronchitis +/- CAP (NOS)supported by CT scan Post nasal gtt Cough GERD Acute on chronic diastolic heart failure Demand ischemia Pulmonary edema CRI H/o CAD DM HA  Acute hypoxic respiratory failure in setting of what is likely AECOPD +/- CAP (NOS) vs acute bronchitis, complicated by persistent post-nasal gtt, cough and upper airway irritation as well as volume excess (from acute on chronic diastolic HF).  -his cough and wheezing are his major  complaints. Otherwise he is actually feeling better. He reports these symptoms are at their worst when he is in the supine position.  Plan Continue diuresis 1.5 litre neg x 24 hrs Cont inhaled BDs Cont supplemental oxygen  Change solumedrol to q12 Cont levaquin day 4/5 Check procal 5/30 Add nasal steroid, Oral antihistamine and increase PPI Would benefit from PFTs at some point as states he typically has activity limitations d/t shortness of breath   Richardson Landry Minor ACNP Mary[facility] Shape PCCM Pager [phone] till 3 pm If no answer page [phone] 07/24/2016, 9:39 AM  Attending Note:  I have examined patient, reviewed labs, studies and notes. I have discussed the case with S Minor, and I agree with the data and plans as amended above.  83 yo man, hx CAD and CABG, admitted with acute bronchitis vs CAP, associated bronchospasm and hypoxemic resp failure. Has has been treated with high flow O2, BD's, steroids and diuretics, empiric abx. He is on Venti mask today, an improvement. On my eval he is up to chair, has coarse BS with rhonchi but no wheeze. I would continue current medications, hold off on diuresis today given increase in his S Cr. continue to wean fiO2 as able. We will follow her.    Baltazar Apo,  MD, PhD 07/24/2016, 2:49 PM [facility] [phone] or if no answer [phone]

## 2016-07-24 NOTE — Care Management Note (Addendum)
Case Management Note  Patient Details  Name: Lawrence Warner MRN: [id number] Date of Birth: 12/12/1918  Subjective/Objective: Pt presented from [facility] for Acute Respiratory Failure. Pt continues on IV Solumedrol. Plan to return to [facility] with [facility]. Pt may need home 02 once stable for d/c-monitoring.                    Action/Plan: CM did discuss disposition needs with patient. Pt only wants [facility] at this time via [facility]. CM did discuss [facility] RN and pt declines stating that if he needs RN he can call [facility]. Referral given to [facility] and [facility] to begin within 24-48 hours post d/c. Pt will need HHPT orders and F2F once stable for d/c.   Expected Discharge Date:                  Expected Discharge Plan:  [place] (Pt is from [place]. )  In-House Referral:  NA  Discharge planning Services  CM Consult  Post Acute Care Choice:  Home Health, Durable Medical Equipment.  Choice offered to:  Patient  DME Arranged:   Oxygen DME Agency:   [facility] Arranged:  PT HH Agency:  [place]  Status of Service:  Completed, signed off  If discussed at [place] of Stay Meetings, dates discussed:    Additional Comments: 1120 07-26-16 Jacqlyn Krauss, RN,BSN [phone]  Pt did qualify for home 02. CM did speak with pt in regards to agency of choice and pt chose [facility] for DME. Referral made and DME to be delivered to room before d/c. Per pt son will provide transportation back to [place]. No further needs from CM at this time.  Bethena Roys, RN 07/24/2016, 4:06 PM

## 2016-07-24 NOTE — Progress Notes (Signed)
CSW received consult that patient is from [place]. Patient will discharge back to ILF with home health. No placement assistance needed from CSW.  CSW signing off. Please re-consult if needed.  Estanislado Emms, [place]

## 2016-07-25 DIAGNOSIS — E119 Type 2 diabetes mellitus without complications: Secondary | ICD-10-CM

## 2016-07-25 DIAGNOSIS — D693 Immune thrombocytopenic purpura: Secondary | ICD-10-CM

## 2016-07-25 DIAGNOSIS — K219 Gastro-esophageal reflux disease without esophagitis: Secondary | ICD-10-CM

## 2016-07-25 LAB — RESPIRATORY PANEL BY PCR
Adenovirus: NOT DETECTED
BORDETELLA PERTUSSIS-RVPCR: NOT DETECTED
CHLAMYDOPHILA PNEUMONIAE-RVPPCR: NOT DETECTED
CORONAVIRUS HKU1-RVPPCR: NOT DETECTED
Coronavirus 229E: NOT DETECTED
Coronavirus NL63: NOT DETECTED
Coronavirus OC43: NOT DETECTED
INFLUENZA A-RVPPCR: NOT DETECTED
Influenza B: NOT DETECTED
METAPNEUMOVIRUS-RVPPCR: DETECTED — AB
Mycoplasma pneumoniae: NOT DETECTED
PARAINFLUENZA VIRUS 2-RVPPCR: NOT DETECTED
PARAINFLUENZA VIRUS 3-RVPPCR: NOT DETECTED
PARAINFLUENZA VIRUS 4-RVPPCR: NOT DETECTED
Parainfluenza Virus 1: NOT DETECTED
RESPIRATORY SYNCYTIAL VIRUS-RVPPCR: NOT DETECTED
RHINOVIRUS / ENTEROVIRUS - RVPPCR: NOT DETECTED

## 2016-07-25 LAB — CBC
HCT: 39.2 % (ref 39.0–52.0)
Hemoglobin: 12.4 g/dL — ABNORMAL LOW (ref 13.0–17.0)
MCH: 30.5 pg (ref 26.0–34.0)
MCHC: 31.6 g/dL (ref 30.0–36.0)
MCV: 96.3 fL (ref 78.0–100.0)
Platelets: 160 10*3/uL (ref 150–400)
RBC: 4.07 MIL/uL — ABNORMAL LOW (ref 4.22–5.81)
RDW: 12.9 % (ref 11.5–15.5)
WBC: 11.6 10*3/uL — ABNORMAL HIGH (ref 4.0–10.5)

## 2016-07-25 LAB — BASIC METABOLIC PANEL
Anion gap: 9 (ref 5–15)
BUN: 63 mg/dL — ABNORMAL HIGH (ref 6–20)
CO2: 27 mmol/L (ref 22–32)
Calcium: 9.1 mg/dL (ref 8.9–10.3)
Chloride: 100 mmol/L — ABNORMAL LOW (ref 101–111)
Creatinine, Ser: 1.97 mg/dL — ABNORMAL HIGH (ref 0.61–1.24)
GFR calc Af Amer: 32 mL/min — ABNORMAL LOW (ref >60)
GFR calc non Af Amer: 27 mL/min — ABNORMAL LOW (ref >60)
Glucose, Bld: 141 mg/dL — ABNORMAL HIGH (ref 65–99)
Potassium: 4.6 mmol/L (ref 3.5–5.1)
Sodium: 136 mmol/L (ref 135–145)

## 2016-07-25 LAB — CULTURE, BLOOD (ROUTINE X 2)
CULTURE: NO GROWTH
CULTURE: NO GROWTH
Special Requests: ADEQUATE

## 2016-07-25 LAB — GLUCOSE, CAPILLARY
GLUCOSE-CAPILLARY: 130 mg/dL — AB (ref 65–99)
Glucose-Capillary: 206 mg/dL — ABNORMAL HIGH (ref 65–99)
Glucose-Capillary: 251 mg/dL — ABNORMAL HIGH (ref 65–99)
Glucose-Capillary: 137 mg/dL — ABNORMAL HIGH (ref 65–99)

## 2016-07-25 NOTE — Progress Notes (Signed)
PRN albuterol treatment given per pt's request for increase coughing and SOB. Will continue to monitor closely.  Jacqlyn Larsen, RN

## 2016-07-25 NOTE — Progress Notes (Signed)
Name: Garnet Overfield MRN: [id number] DOB: 01/05/1919    ADMISSION DATE:  07/20/2016 CONSULTATION DATE:  5/29  REFERRING MD :  Doyle Askew   CHIEF COMPLAINT:    BRIEF PATIENT DESCRIPTION:  This is a 83 year old male w/ known h/o CAD. Presented on 5/26 w/ 3-4 d history of worsening cough, productive of yellow sputum, decreased appetite, wheezing and shortness of breath. He was admitted w/ working dx of CAP, COPD exacerbation, complicated by demand ischemia and acute on chronic HF. He was treated w/ IV antibiotics, supplemental oxygen & inhaled BDs as well as lasix. His dyspnea improved w/ these measures but continued to wheeze and demonstrate decreased pulse ox w/ ambulation as low as mid 80s. PCCM was asked to eval given persistent oxygen needs.   SIGNIFICANT EVENTS    STUDIES:  CT chest 5/28: There is no pleural effusion. Mild diffuse central airway thickening with patchy peribronchial ground-glass densities, greatest in the right lower lobe. There is no consolidation or suspicious pulmonary nodule. ECHO 5/28 Left ventricle: The cavity size was normal. Wall thickness was increased in a pattern of moderate LVH. Systolic function was normal. The estimated ejection fraction was in the range of 60% to 65%. Wall motion was normal; there were no regional wall motion abnormalities. - Mitral valve: There was mild regurgitation. - Left atrium: The atrium was severely dilated. - Right atrium: The atrium was mildly dilated.  HISTORY OF PRESENT ILLNESS:  See above     SUBJECTIVE:  More awake and interactive.  VITAL SIGNS: Temp:  [97.6 F (36.4 C)] 97.6 F (36.4 C) (05/31 0600) Pulse Rate:  [76-90] 76 (05/31 0600) Resp:  [18-22] 18 (05/31 0600) BP: (112-132)/(48-72) 132/48 (05/31 0600) SpO2:  [93 %-98 %] 98 % (05/31 0859) FiO2 (%):  [31 %] 31 % (05/31 0859) Weight:  [205 lb 3.2 oz (93.1 kg)] 205 lb 3.2 oz (93.1 kg) (05/31 0600)  PHYSICAL EXAMINATION: General: More awake and interactive,  NAD at rest  HEENT: No JVD/LAN PSY: Dull affect Neuro: intact CV: HSR RRR PULM: Decreased air movement. Remains oin 31% venti mask DJ:SHFW, non-tender, bsx4 active  Extremities: warm/dry, + edema  Skin: no rashes or lesions    Recent Labs Lab 07/23/16 0613 07/24/16 0444 07/25/16 0332  NA 138 137 136  K 4.3 4.4 4.6  CL 103 102 100*  CO2 24 26 27   BUN 50* 62* 63*  CREATININE 1.71* 2.02* 1.97*  GLUCOSE 160* 161* 141*    Recent Labs Lab 07/23/16 0613 07/24/16 0444 07/25/16 0332  HGB 13.6 12.9* 12.4*  HCT 43.1 41.1 39.2  WBC 16.0* 12.1* 11.6*  PLT 189 171 160   No results found.   Intake/Output Summary (Last 24 hours) at 07/25/16 1024 Last data filed at 07/25/16 0549  Gross per 24 hour  Intake              600 ml  Output              900 ml  Net             -300 ml   ASSESSMENT / PLAN:  Acute hypoxic respiratory failure AECOPD (presumptive) w/ acute purulent bronchitis +/- CAP (NOS)supported by CT scan Post nasal gtt Cough GERD Acute on chronic diastolic heart failure Demand ischemia Pulmonary edema CRI H/o CAD DM HA  Acute hypoxic respiratory failure in setting of what is likely AECOPD +/- CAP (NOS) vs acute bronchitis, complicated by persistent post-nasal gtt, cough and upper airway  irritation as well as volume excess (from acute on chronic diastolic HF).  -his cough and wheezing are his major complaints. Otherwise he is actually feeling better. He reports these symptoms are at their worst when he is in the supine position.  Plan Continue to hold diuresis but may need to restart as creatine improves. Cont inhaled BDs Cont supplemental oxygen , change to [place] and wean as tolerated Changed solumedrol to q24 5/29, consider transition to po prednisone 40 mg qd 6/1 Cont levaquin day 5/5 Check procal 5/30 <0.10 Add nasal steroid, Oral antihistamine and increase PPI Would benefit from PFTs at some point as states he typically has activity limitations d/t  shortness of breath    Richardson Landry Minor ACNP Maryanna Shape PCCM Pager [phone] till 3 pm If no answer page [phone] 07/25/2016, 10:24 AM  Attending Note:  I have examined patient, reviewed labs, studies and notes. I have discussed the case with S Minor, and I agree with the data and plans as amended above. 83 yo man, hx CAD / CABG, admitted with CAP and suspected AE-COPD. Has clinically improved on steroids and after diuresis but he remains on venti mask. Has been resistant to changing to [place] O2 because he "doesn't breathe through his nose". ? Whether he needs nasal saline or nasal steroid. His wheeze has resolved, has some B rhonchi on my exam. Agree with transition to oral steroids. Need to transition to  O2 as well. S cr is improved today.    Baltazar Apo, MD, PhD 07/25/2016, 3:18 PM [facility] [phone] or if no answer [phone]

## 2016-07-25 NOTE — Progress Notes (Signed)
Physical Therapy Treatment Patient Details Name: Lawrence Warner MRN: [id number] DOB: 08/18/1918 Today's Date: 07/25/2016    History of Present Illness Lawrence Warner is a 83 y.o. male with medical history significant of CAD, s/p CABG IN 2000, DM, hypertension, ITP, BPH, stage 3 CKD, osteoarthritis, is here for sob, chills, cough with productive sputum, wheezing. Admitted for acute respiratory failure.    PT Comments    Upon entering room, pt was in bathroom.  Assisted with sit to stand and peri care then amb in hallway a great distance.  Trial RA decreased from 92% to 88% with avg HR 88.  Pt feeling "better" and plans to return to home.   Follow Up Recommendations  Home health PT     Equipment Recommendations  None recommended by PT    Recommendations for Other Services       Precautions / Restrictions Precautions Precautions: Fall Precaution Comments: Monitor O2 Restrictions Weight Bearing Restrictions: No    Mobility  Bed Mobility               General bed mobility comments: OOB in bathroom  Transfers Overall transfer level: Needs assistance Equipment used: None Transfers: Sit to/from Stand Sit to Stand: Min guard;Supervision         General transfer comment: MinGuard/Supervision for safety and VC's for turn completion prior to sit  Ambulation/Gait Ambulation/Gait assistance: Supervision;Min guard Ambulation Distance (Feet): 115 Feet Assistive device: Rolling walker (2 wheeled) Gait Pattern/deviations: Step-through pattern;Decreased stride length Gait velocity: slow   General Gait Details: Assist for safety. Pt amb on RA with SpO2 dropping to 88%. Replaced O2 via face mask (pt is mouth breather) and SpO2 returned to 92%.   Stairs            Wheelchair Mobility    Modified Rankin (Stroke Patients Only)       Balance                                            Cognition Arousal/Alertness: Awake/alert Behavior  During Therapy: WFL for tasks assessed/performed Overall Cognitive Status: Within Functional Limits for tasks assessed                                        Exercises      General Comments        Pertinent Vitals/Pain Pain Assessment: No/denies pain    Home Living                      Prior Function            PT Goals (current goals can now be found in the care plan section) Progress towards PT goals: Progressing toward goals    Frequency    Min 3X/week      PT Plan Current plan remains appropriate    Co-evaluation              AM-PAC PT "6 Clicks" Daily Activity  Outcome Measure  Difficulty turning over in bed (including adjusting bedclothes, sheets and blankets)?: None Difficulty moving from lying on back to sitting on the side of the bed? : None Difficulty sitting down on and standing up from a chair with arms (e.g., wheelchair, bedside commode, etc,.)?: None Help needed moving to and  from a bed to chair (including a wheelchair)?: None Help needed walking in hospital room?: A Little Help needed climbing 3-5 steps with a railing? : A Little 6 Click Score: 22    End of Session Equipment Utilized During Treatment: Oxygen Activity Tolerance: Patient tolerated treatment well Patient left: in chair;with call bell/phone within reach Nurse Communication: Mobility status PT Visit Diagnosis: Muscle weakness (generalized) (M62.81);Other abnormalities of gait and mobility (R26.89)     Time: 4835-0757 PT Time Calculation (min) (ACUTE ONLY): 35 min  Charges:  $Gait Training: 8-22 mins $Therapeutic Activity: 8-22 mins                    G Codes:       Rica Koyanagi  PTA [facility]  Acute  Rehab Pager      [phone]

## 2016-07-25 NOTE — Progress Notes (Signed)
Patient ID: Lawrence Warner, male   DOB: Oct 18, 1918, 83 y.o.   MRN: [id number]    PROGRESS NOTE  Lawrence Warner  OMB:[id number] DOB: Jan 07, 1919 DOA: 07/20/2016  PCP: Hoyt Koch, MD   Brief Narrative:  Pt is 83 yo male with known CAD, s/p CABG in 2000, HTM, BPH, CKD stage III, presented with 3-4 days duration of progressively worsening cough productive of yellow sputum, dyspnea, wheezing, poor oral intake. In ED, pt was found to be hypoxic and required 2 L oxygen via [place].   Major events since admission: 5/29 - persistent dyspnea, requiring NRB this AM, PCCM consulted    Assessment & Plan:  Acute respiratory failure with hypoxia - secondary to bronchopneumonia with acute COPD exacerbation - degree of hypoxia improving significantly - PCCM consulting, continue levaquin, steroids dose lowered, change to prednisone - PT, ambulate, nebs -OOB -holding diuretics   Elevated troponin - suspect demand ischemia in the setting of acute COPD, bronchopneumonia - asymptomatic, no cardiac workup planned  CKD stage III - Cr bumped up with diuretics and ARB -stopped ARB, lasix on hold  ITP - resolved   DM type II with complications of nephropathy - reasonable control for now - on SSI   HTN, essential  - stable  Dementia - appears to be at baseline   DVT prophylaxis: Lovenox SQ Code Status: DNR Family Communication: Patient  Disposition Plan: to be determined, SNF in few days  Consultants:   PCCM  Procedures:   none  Antimicrobials:   Levaquin 5/26 ->  Subjective: Breathing improving  Objective: Vitals:   07/24/16 1340 07/24/16 1621 07/25/16 0600 07/25/16 0859  BP:  112/72 (!) 132/48   Pulse:  90 76   Resp:  (!) 22 18   Temp:   97.6 F (36.4 C)   TempSrc:   Axillary   SpO2: 97% 97% 93% 98%  Weight:   93.1 kg (205 lb 3.2 oz)   Height:        Intake/Output Summary (Last 24 hours) at 07/25/16 1240 Last data filed at 07/25/16 0549  Gross per 24  hour  Intake              600 ml  Output              900 ml  Net             -300 ml   Filed Weights   07/23/16 0415 07/24/16 0555 07/25/16 0600  Weight: 94.5 kg (208 lb 4.8 oz) 93.8 kg (206 lb 12.8 oz) 93.1 kg (205 lb 3.2 oz)   Physical Exam  Gen: AAOx2, no distress HEENT: PERRLA CVS: S1S2/RRR Pulmonary: few coarse breath sounds, no wheezes noted Abdominal: +BS, soft , BS present Musculoskeletal: moves all extremities Ext: no edema  Data Reviewed: I have personally reviewed following labs and imaging studies  CBC:  Recent Labs Lab 07/20/16 1334 07/22/16 0357 07/23/16 0613 07/24/16 0444 07/25/16 0332  WBC 8.4 16.0* 16.0* 12.1* 11.6*  HGB 13.0 12.2* 13.6 12.9* 12.4*  HCT 40.4 38.1* 43.1 41.1 39.2  MCV 95.7 94.5 96.9 97.4 96.3  PLT 188 188 189 171 453   Basic Metabolic Panel:  Recent Labs Lab 07/21/16 0345 07/22/16 0357 07/23/16 0613 07/24/16 0444 07/25/16 0332  NA 137 135 138 137 136  K 4.1 4.6 4.3 4.4 4.6  CL 104 103 103 102 100*  CO2 21* 23 24 26 27   GLUCOSE 217* 244* 160* 161* 141*  BUN 28* 36* 50* 62*  63*  CREATININE 1.62* 1.65* 1.71* 2.02* 1.97*  CALCIUM 9.1 9.5 9.8 9.4 9.1   Liver Function Tests:  Recent Labs Lab 07/20/16 1334  AST 35  ALT 24  ALKPHOS 67  BILITOT 0.3  PROT 7.0  ALBUMIN 3.6   Cardiac Enzymes:  Recent Labs Lab 07/20/16 1558 07/20/16 2232 07/21/16 0345  TROPONINI <0.03 0.04* 0.04*   CBG:  Recent Labs Lab 07/24/16 1134 07/24/16 1634 07/24/16 2201 07/25/16 0743 07/25/16 1131  GLUCAP 266* 293* 147* 130* 206*   Recent Results (from the past 240 hour(s))  Blood culture (routine x 2)     Status: None (Preliminary result)   Collection Time: 07/20/16  3:45 PM  Result Value Ref Range Status   Specimen Description BLOOD RIGHT ANTECUBITAL  Final   Special Requests   Final    BOTTLES DRAWN AEROBIC AND ANAEROBIC Blood Culture results may not be optimal due to an excessive volume of blood received in culture bottles    Culture NO GROWTH 4 DAYS  Final   Report Status PENDING  Incomplete  Blood culture (routine x 2)     Status: None (Preliminary result)   Collection Time: 07/20/16  3:58 PM  Result Value Ref Range Status   Specimen Description BLOOD RIGHT HAND  Final   Special Requests   Final    BOTTLES DRAWN AEROBIC ONLY Blood Culture adequate volume   Culture NO GROWTH 4 DAYS  Final   Report Status PENDING  Incomplete  Respiratory Panel by PCR     Status: Abnormal   Collection Time: 07/24/16  5:11 PM  Result Value Ref Range Status   Adenovirus NOT DETECTED NOT DETECTED Final   Coronavirus 229E NOT DETECTED NOT DETECTED Final   Coronavirus HKU1 NOT DETECTED NOT DETECTED Final   Coronavirus NL63 NOT DETECTED NOT DETECTED Final   Coronavirus OC43 NOT DETECTED NOT DETECTED Final   Metapneumovirus DETECTED (A) NOT DETECTED Final   Rhinovirus / Enterovirus NOT DETECTED NOT DETECTED Final   Influenza A NOT DETECTED NOT DETECTED Final   Influenza B NOT DETECTED NOT DETECTED Final   Parainfluenza Virus 1 NOT DETECTED NOT DETECTED Final   Parainfluenza Virus 2 NOT DETECTED NOT DETECTED Final   Parainfluenza Virus 3 NOT DETECTED NOT DETECTED Final   Parainfluenza Virus 4 NOT DETECTED NOT DETECTED Final   Respiratory Syncytial Virus NOT DETECTED NOT DETECTED Final   Bordetella pertussis NOT DETECTED NOT DETECTED Final   Chlamydophila pneumoniae NOT DETECTED NOT DETECTED Final   Mycoplasma pneumoniae NOT DETECTED NOT DETECTED Final    Radiology Studies: Ct Head Wo Contrast Result Date: 07/20/2016 Mild diffuse cortical atrophy. No acute intracranial abnormality seen.   Dg Chest Port 1 View Result Date: 07/20/2016 1. Low lung volumes without radiographic evidence of acute cardiopulmonary disease. 2. Aortic atherosclerosis.  Scheduled Meds: . azelastine  2 spray Each Nare BID  . enoxaparin (LOVENOX) injection  30 mg Subcutaneous Q24H  . fluticasone  2 spray Each Nare BID  . guaiFENesin  600 mg Oral BID    . insulin aspart  0-9 Units Subcutaneous TID WC  . ipratropium-albuterol  3 mL Nebulization Q6H  . levofloxacin  250 mg Oral Daily  . magnesium oxide  400 mg Oral Q supper  . mouth rinse  15 mL Mouth Rinse BID  . methylPREDNISolone (SOLU-MEDROL) injection  40 mg Intravenous Daily  . metoprolol tartrate  6.25 mg Oral BID WC  . multivitamin with minerals  1 tablet Oral Q breakfast  .  pantoprazole  40 mg Oral BID  . simvastatin  20 mg Oral QHS  . sodium chloride flush  3 mL Intravenous Q12H   Continuous Infusions:   LOS: 4 days   Time spent: 35 minutes   Domenic Polite, MD [facility] Pager [phone] If 7PM-7AM, please contact night-coverage [email or web address] Password [email or web address] 07/25/2016, 12:40 PM

## 2016-07-26 LAB — BASIC METABOLIC PANEL
ANION GAP: 10 (ref 5–15)
BUN: 60 mg/dL — ABNORMAL HIGH (ref 6–20)
CHLORIDE: 100 mmol/L — AB (ref 101–111)
CO2: 27 mmol/L (ref 22–32)
Calcium: 9.5 mg/dL (ref 8.9–10.3)
Creatinine, Ser: 1.95 mg/dL — ABNORMAL HIGH (ref 0.61–1.24)
GFR calc Af Amer: 32 mL/min — ABNORMAL LOW (ref >60)
GFR calc non Af Amer: 28 mL/min — ABNORMAL LOW (ref >60)
GLUCOSE: 122 mg/dL — AB (ref 65–99)
POTASSIUM: 4.1 mmol/L (ref 3.5–5.1)
Sodium: 137 mmol/L (ref 135–145)

## 2016-07-26 LAB — CBC
HEMATOCRIT: 41.6 % (ref 39.0–52.0)
HEMOGLOBIN: 13.5 g/dL (ref 13.0–17.0)
MCH: 31.2 pg (ref 26.0–34.0)
MCHC: 32.5 g/dL (ref 30.0–36.0)
MCV: 96.1 fL (ref 78.0–100.0)
Platelets: 190 10*3/uL (ref 150–400)
RBC: 4.33 MIL/uL (ref 4.22–5.81)
RDW: 12.6 % (ref 11.5–15.5)
WBC: 11.8 10*3/uL — ABNORMAL HIGH (ref 4.0–10.5)

## 2016-07-26 LAB — GLUCOSE, CAPILLARY
GLUCOSE-CAPILLARY: 128 mg/dL — AB (ref 65–99)
Glucose-Capillary: 199 mg/dL — ABNORMAL HIGH (ref 65–99)

## 2016-07-26 LAB — PROCALCITONIN

## 2016-07-26 MED ORDER — PREDNISONE 20 MG PO TABS: 40.0000 mg | ORAL_TABLET | Freq: Every day | ORAL | Status: DC

## 2016-07-26 MED ORDER — GUAIFENESIN ER 600 MG PO TB12: 600.0000 mg | ORAL_TABLET | Freq: Two times a day (BID) | ORAL | Status: DC

## 2016-07-26 MED ORDER — FLUTICASONE PROPIONATE 50 MCG/ACT NA SUSP: 2.0000 | Freq: Two times a day (BID) | NASAL | 0 refills | Status: DC

## 2016-07-26 MED ORDER — PREDNISONE 20 MG PO TABS: 20.0000 mg | ORAL_TABLET | Freq: Every day | ORAL | 0 refills | Status: DC

## 2016-07-26 MED ORDER — LEVOFLOXACIN 250 MG PO TABS
250.0000 mg | ORAL_TABLET | Freq: Every day | ORAL | 0 refills | Status: DC
Start: 2016-07-27 — End: 2016-07-30

## 2016-07-26 MED ORDER — OMEPRAZOLE 20 MG PO CPDR: 20.0000 mg | DELAYED_RELEASE_CAPSULE | Freq: Two times a day (BID) | ORAL | Status: DC

## 2016-07-26 MED ORDER — LEVOFLOXACIN 250 MG PO TABS: 250.0000 mg | ORAL_TABLET | Freq: Every day | ORAL | 0 refills | Status: DC

## 2016-07-26 MED ORDER — GUAIFENESIN ER 600 MG PO TB12: 600.0000 mg | ORAL_TABLET | Freq: Two times a day (BID) | ORAL | 0 refills | Status: DC

## 2016-07-26 NOTE — Discharge Summary (Signed)
Physician Discharge Summary  Lawrence Warner LKG:[id number] DOB: 1919/01/19 DOA: 07/20/2016  PCP: Hoyt Koch, MD  Admit date: 07/20/2016 Discharge date: 07/26/2016  Time spent: 35 minutes  Recommendations for Outpatient Follow-up:  1. PCP in 1 week, please check Bmet at Follow up, please consider low dose lasix at follow up if creatinine stable/improving   Discharge Diagnoses:      Acute respiratory failure ([place])   COPD exacerbation   Bronchitis   Hypoxia   Idiopathic thrombocytopenic purpura ([place])   Diet-controlled diabetes mellitus ([facility])   CAD (coronary artery disease)   Essential hypertension   GERD (gastroesophageal reflux disease)   HLD (hyperlipidemia)   CKD 3   Elevated troponin   Dementia-mild   Discharge Condition: stable  Diet recommendation: Low sodium heart healthy and diabteic  Filed Weights   07/24/16 0555 07/25/16 0600 07/26/16 6644  Weight: 93.8 kg (206 lb 12.8 oz) 93.1 kg (205 lb 3.2 oz) 92 kg (202 lb 12.8 oz)    History of present illness:  Pt is 83 yo male with known CAD, s/p CABG in 2000, HTM, BPH, CKD stage III, presented with 3-4 days duration of progressively worsening cough productive of yellow sputum, dyspnea, wheezing, poor oral intake. In ED, pt was found to be hypoxic and required 2 L oxygen via [place].   Hospital Course:  Acute respiratory failure with hypoxia - secondary to bronchopneumonia with acute COPD exacerbation - improved back to baseline - PCCM consulted, continue levaquin, steroids dose lowered, changed to prednisone taper - was also diuresed briefly, then lasix held when creatinine bumped, ECHO notable for normal EF. -resume low dose lasix in 1 week if creatinine stable  Elevated troponin - suspect demand ischemia in the setting of acute COPD, bronchopneumonia - asymptomatic, no cardiac workup planned  CKD stage III - Cr bumped up with diuretics and ARB -stopped ARB, lasix held -creatinine stable in 1.9  range -check Bmet in 1 week and start low dose lasix in creatinine stable/improved  H/o ITP - platelets normal this admit  DM type II with complications of nephropathy - diet controlled, slightly high in setting of steroids, but didn't need long acting Insulin -SSI used inpatient -consider oral hypoglycemic depending on CBGs once off prednisone  HTN, essential  - stable  Dementia - appears to be at baseline   Consultations:  PCCM  Discharge Exam: Vitals:   07/25/16 2041 07/26/16 0623  BP: (!) 142/61 132/62  Pulse: 67 64  Resp: (!) 25 (!) 21  Temp: 97.1 F (36.2 C) 97.6 F (36.4 C)    General: AAOx3 Cardiovascular: S1S2/RRR Respiratory:improved air movement  Discharge Instructions   Discharge Instructions    Diet - low sodium heart healthy    Complete by:  As directed    Increase activity slowly    Complete by:  As directed      Current Discharge Medication List    START taking these medications   Details  fluticasone (FLONASE) 50 MCG/ACT nasal spray Place 2 sprays into both nostrils 2 (two) times daily. For 7days Qty: 5 g, Refills: 0    guaiFENesin (MUCINEX) 600 MG 12 hr tablet Take 1 tablet (600 mg total) by mouth 2 (two) times daily. For 5days    levofloxacin (LEVAQUIN) 250 MG tablet Take 1 tablet (250 mg total) by mouth daily. For 3days Qty: 3 tablet, Refills: 0    predniSONE (DELTASONE) 20 MG tablet Take 1-2 tablets (20-40 mg total) by mouth daily with breakfast. Take 40mg  daily  for 2days then 20mg  daily for 2days then STOP Qty: 6 tablet, Refills: 0      CONTINUE these medications which have CHANGED   Details  omeprazole (PRILOSEC) 20 MG capsule Take 1 capsule (20 mg total) by mouth 2 (two) times daily before a meal.      CONTINUE these medications which have NOT CHANGED   Details  acetaminophen (TYLENOL) 500 MG tablet Take 250-1,000 mg by mouth every 6 (six) hours as needed for mild pain, moderate pain or headache.     magnesium oxide  (MAG-OX) 400 MG tablet Take 400 mg by mouth at bedtime.    metoprolol tartrate (LOPRESSOR) 25 MG tablet Take 0.5 tablets (12.5 mg total) by mouth 2 (two) times daily. Qty: 90 tablet, Refills: 0    Multiple Vitamin (MULTIVITAMIN WITH MINERALS) TABS tablet Take 1 tablet by mouth daily.    nitroGLYCERIN (NITROSTAT) 0.4 MG SL tablet Place 1 tablet (0.4 mg total) under the tongue every 5 (five) minutes as needed for chest pain. Qty: 25 tablet, Refills: 3    simvastatin (ZOCOR) 20 MG tablet Take 20 mg by mouth at bedtime.     hydroxypropyl methylcellulose / hypromellose (ISOPTO TEARS / GONIOVISC) 2.5 % ophthalmic solution Place 1 drop into both eyes 3 (three) times daily as needed for dry eyes.      STOP taking these medications     losartan (COZAAR) 100 MG tablet      magnesium chloride (SLOW-MAG) 64 MG TBEC SR tablet        Allergies  Allergen Reactions  . Alfuzosin Hcl Er Other (See Comments)    confusion  . Penicillins Hives and Other (See Comments)    Has patient had a PCN reaction causing immediate rash, facial/tongue/throat swelling, SOB or lightheadedness with hypotension: No Has patient had a PCN reaction causing severe rash involving mucus membranes or skin necrosis: No Has patient had a PCN reaction that required hospitalization No Has patient had a PCN reaction occurring within the last 10 years: No If all of the above answers are "NO", then may proceed with Cephalosporin use.   Follow-up Information    [facility] Follow up.   Specialty:  Home Health Services Why:  Physical Therapy.  Contact information: [place] [place] [address] [phone]        Hoyt Koch, MD. Schedule an appointment as soon as possible for a visit in 1 week(s).   Specialty:  Internal Medicine Contact information: [facility] [phone]            The results of significant diagnostics from this  hospitalization (including imaging, microbiology, ancillary and laboratory) are listed below for reference.    Significant Diagnostic Studies: Ct Head Wo Contrast  Result Date: 07/20/2016 CLINICAL DATA:  Headache. EXAM: CT HEAD WITHOUT CONTRAST TECHNIQUE: Contiguous axial images were obtained from the base of the skull through the vertex without intravenous contrast. COMPARISON:  None. FINDINGS: Brain: Mild diffuse cortical atrophy is noted. No mass effect or midline shift is noted. Ventricular size is within normal limits. There is no evidence of mass lesion, hemorrhage or acute infarction. Vascular: No hyperdense vessel or unexpected calcification. Skull: Normal. Negative for fracture or focal lesion. Sinuses/Orbits: No acute finding. Other: None. IMPRESSION: Mild diffuse cortical atrophy. No acute intracranial abnormality seen. Electronically Signed   By: Marijo Conception, M.D.   On: 07/20/2016 14:21   Ct Chest Wo Contrast  Result Date:  07/22/2016 CLINICAL DATA:  Progressive productive cough with shortness of breath and wheezing. EXAM: CT CHEST WITHOUT CONTRAST TECHNIQUE: Multidetector CT imaging of the chest was performed following the standard protocol without IV contrast. COMPARISON:  Radiographs 07/20/2016 and 02/28/2016. FINDINGS: Cardiovascular: There is atherosclerosis of the aorta, great vessels and coronary arteries status post median sternotomy and CABG. No acute vascular findings are seen on noncontrast imaging. The heart size is normal. There is no pericardial effusion. Mediastinum/Nodes: There are no enlarged mediastinal, hilar or axillary lymph nodes.Small calcified right hilar nodes are noted. The thyroid gland, trachea and esophagus demonstrate no significant findings. Lungs/Pleura: There is no pleural effusion. Mild diffuse central airway thickening with patchy peribronchial ground-glass densities, greatest in the right lower lobe. There is no consolidation or suspicious pulmonary nodule.  Upper abdomen: Hepatic low density consistent with steatosis. There are scattered small calcified granulomas in the liver and spleen. Probable small cyst posteriorly in the mid left kidney. Musculoskeletal/Chest wall: No acute osseous findings are seen. There is diffuse ankylosis of the cervicothoracic spine consistent with idiopathic skeletal hyperostosis. There is a large posterior disc osteophyte complex at C2-3, asymmetric to the left. This may contribute to mass effect on the cord at this level. IMPRESSION: 1. Central airway thickening with patchy peribronchial opacities in both lungs, likely early bronchopneumonia or atypical infection. No consolidation. 2. Diffuse atherosclerosis post CABG. 3. Diffuse idiopathic skeletal hyperostosis with large posterior disc osteophyte complex at C2-3 causing canal compromise. Electronically Signed   By: Richardean Sale M.D.   On: 07/22/2016 10:35   Dg Chest Port 1 View  Result Date: 07/23/2016 CLINICAL DATA:  83 year old male with shortness of breath. EXAM: PORTABLE CHEST 1 VIEW COMPARISON:  Chest CT dated 07/22/2016 FINDINGS: Single portable view of the chest demonstrates minimal bibasilar densities, likely atelectatic changes. There is no focal consolidation, pleural effusion, or pneumothorax. Top-normal cardiac size. Median sternotomy wires and CABG vascular clips. No acute osseous pathology. IMPRESSION: No focal consolidation. Electronically Signed   By: Anner Crete M.D.   On: 07/23/2016 03:42   Dg Chest Port 1 View  Result Date: 07/20/2016 CLINICAL DATA:  83 year old male with shortness of breath and productive cough with yellow mucus for the past 2 days. EXAM: PORTABLE CHEST 1 VIEW COMPARISON:  Chest x-ray 02/28/2016. FINDINGS: Lung volumes are low. No consolidative airspace disease. No pleural effusions. No pneumothorax. No pulmonary nodule or mass noted. Pulmonary vasculature and the cardiomediastinal silhouette are within normal limits. Aortic  atherosclerosis. Status post median sternotomy for CABG including LIMA. IMPRESSION: 1. Low lung volumes without radiographic evidence of acute cardiopulmonary disease. 2. Aortic atherosclerosis. Electronically Signed   By: Vinnie Langton M.D.   On: 07/20/2016 13:36    Microbiology: Recent Results (from the past 240 hour(s))  Blood culture (routine x 2)     Status: None   Collection Time: 07/20/16  3:45 PM  Result Value Ref Range Status   Specimen Description BLOOD RIGHT ANTECUBITAL  Final   Special Requests   Final    BOTTLES DRAWN AEROBIC AND ANAEROBIC Blood Culture results may not be optimal due to an excessive volume of blood received in culture bottles   Culture NO GROWTH 5 DAYS  Final   Report Status 07/25/2016 FINAL  Final  Blood culture (routine x 2)     Status: None   Collection Time: 07/20/16  3:58 PM  Result Value Ref Range Status   Specimen Description BLOOD RIGHT HAND  Final   Special Requests  Final    BOTTLES DRAWN AEROBIC ONLY Blood Culture adequate volume   Culture NO GROWTH 5 DAYS  Final   Report Status 07/25/2016 FINAL  Final  Respiratory Panel by PCR     Status: Abnormal   Collection Time: 07/24/16  5:11 PM  Result Value Ref Range Status   Adenovirus NOT DETECTED NOT DETECTED Final   Coronavirus 229E NOT DETECTED NOT DETECTED Final   Coronavirus HKU1 NOT DETECTED NOT DETECTED Final   Coronavirus NL63 NOT DETECTED NOT DETECTED Final   Coronavirus OC43 NOT DETECTED NOT DETECTED Final   Metapneumovirus DETECTED (A) NOT DETECTED Final   Rhinovirus / Enterovirus NOT DETECTED NOT DETECTED Final   Influenza A NOT DETECTED NOT DETECTED Final   Influenza B NOT DETECTED NOT DETECTED Final   Parainfluenza Virus 1 NOT DETECTED NOT DETECTED Final   Parainfluenza Virus 2 NOT DETECTED NOT DETECTED Final   Parainfluenza Virus 3 NOT DETECTED NOT DETECTED Final   Parainfluenza Virus 4 NOT DETECTED NOT DETECTED Final   Respiratory Syncytial Virus NOT DETECTED NOT DETECTED  Final   Bordetella pertussis NOT DETECTED NOT DETECTED Final   Chlamydophila pneumoniae NOT DETECTED NOT DETECTED Final   Mycoplasma pneumoniae NOT DETECTED NOT DETECTED Final     Labs: Basic Metabolic Panel:  Recent Labs Lab 07/22/16 0357 07/23/16 0613 07/24/16 0444 07/25/16 0332 07/26/16 0635  NA 135 138 137 136 137  K 4.6 4.3 4.4 4.6 4.1  CL 103 103 102 100* 100*  CO2 23 24 26 27 27   GLUCOSE 244* 160* 161* 141* 122*  BUN 36* 50* 62* 63* 60*  CREATININE 1.65* 1.71* 2.02* 1.97* 1.95*  CALCIUM 9.5 9.8 9.4 9.1 9.5   Liver Function Tests:  Recent Labs Lab 07/20/16 1334  AST 35  ALT 24  ALKPHOS 67  BILITOT 0.3  PROT 7.0  ALBUMIN 3.6   No results for input(s): LIPASE, AMYLASE in the last 168 hours. No results for input(s): AMMONIA in the last 168 hours. CBC:  Recent Labs Lab 07/22/16 0357 07/23/16 0932 07/24/16 0444 07/25/16 0332 07/26/16 0635  WBC 16.0* 16.0* 12.1* 11.6* 11.8*  HGB 12.2* 13.6 12.9* 12.4* 13.5  HCT 38.1* 43.1 41.1 39.2 41.6  MCV 94.5 96.9 97.4 96.3 96.1  PLT 188 189 171 160 190   Cardiac Enzymes:  Recent Labs Lab 07/20/16 1558 07/20/16 2232 07/21/16 0345  TROPONINI <0.03 0.04* 0.04*   BNP: BNP (last 3 results)  Recent Labs  02/28/16 0429 02/28/16 0914 07/20/16 1540  BNP 248.7* 259.0* 256.0*    ProBNP (last 3 results) No results for input(s): PROBNP in the last 8760 hours.  CBG:  Recent Labs Lab 07/25/16 0743 07/25/16 1131 07/25/16 1604 07/25/16 2050 07/26/16 0721  GLUCAP 130* 206* 251* 137* 128*       SignedDomenic Polite MD.  [facility] 07/26/2016, 10:28 AM

## 2016-07-26 NOTE — Progress Notes (Signed)
SATURATION QUALIFICATIONS: (This note is used to comply with regulatory documentation for home oxygen)  Patient Saturations on Room Air at Rest = 97%  Patient Saturations on Room Air while Ambulating = 84%  Patient Saturations on 2 Liters of oxygen while Ambulating = 94%  Please briefly explain why patient needs home oxygen:

## 2016-07-26 NOTE — Care Management Important Message (Signed)
Important Message  Patient Details  Name: Lawrence Warner MRN: [id number] Date of Birth: 1918/10/31   Medicare Important Message Given:  Yes    [name] [name] 07/26/2016, 1:08 PM

## 2016-07-26 NOTE — Progress Notes (Signed)
Name: Lawrence Warner MRN: [id number] DOB: December 04, 1918    ADMISSION DATE:  07/20/2016 CONSULTATION DATE:  5/29  REFERRING MD :  Doyle Askew   CHIEF COMPLAINT:    BRIEF PATIENT DESCRIPTION:  This is a 83 year old male w/ known h/o CAD. Presented on 5/26 w/ 3-4 d history of worsening cough, productive of yellow sputum, decreased appetite, wheezing and shortness of breath. He was admitted w/ working dx of CAP, COPD exacerbation, complicated by demand ischemia and acute on chronic HF. He was treated w/ IV antibiotics, supplemental oxygen & inhaled BDs as well as lasix. His dyspnea improved w/ these measures but continued to wheeze and demonstrate decreased pulse ox w/ ambulation as low as mid 80s. PCCM was asked to eval given persistent oxygen needs.   SIGNIFICANT EVENTS    STUDIES:  CT chest 5/28: There is no pleural effusion. Mild diffuse central airway thickening with patchy peribronchial ground-glass densities, greatest in the right lower lobe. There is no consolidation or suspicious pulmonary nodule. ECHO 5/28 Left ventricle: The cavity size was normal. Wall thickness was increased in a pattern of moderate LVH. Systolic function was normal. The estimated ejection fraction was in the range of 60% to 65%. Wall motion was normal; there were no regional wall motion abnormalities. - Mitral valve: There was mild regurgitation. - Left atrium: The atrium was severely dilated. - Right atrium: The atrium was mildly dilated.  HISTORY OF PRESENT ILLNESS:  See above     SUBJECTIVE:  NAD at rest VITAL SIGNS: Temp:  [97.1 F (36.2 C)-97.6 F (36.4 C)] 97.6 F (36.4 C) (06/01 0623) Pulse Rate:  [64-67] 64 (06/01 0623) Resp:  [17-25] 21 (06/01 0623) BP: (128-142)/(56-62) 132/62 (06/01 0623) SpO2:  [91 %-95 %] 91 % (06/01 0913) FiO2 (%):  [31 %] 31 % (05/31 2041) Weight:  [202 lb 12.8 oz (92 kg)] 202 lb 12.8 oz (92 kg) (06/01 4098)  PHYSICAL EXAMINATION: General:  Awake and follows commands.  Remains on FM o2 despite orders to use [address] HEENT: MM pink/moist, no jvd/lan PSY: nl affect Neuro: intact CV: HSR RRR PULM: Decreased air movement, no wheeze, mild rhonchi JX:BJYN, non-tender, bsx4 active  Extremities: warm/dry, + edema  Skin: no rashes or lesions   Recent Labs Lab 07/24/16 0444 07/25/16 0332 07/26/16 0635  NA 137 136 137  K 4.4 4.6 4.1  CL 102 100* 100*  CO2 26 27 27   BUN 62* 63* 60*  CREATININE 2.02* 1.97* 1.95*  GLUCOSE 161* 141* 122*    Recent Labs Lab 07/24/16 0444 07/25/16 0332 07/26/16 0635  HGB 12.9* 12.4* 13.5  HCT 41.1 39.2 41.6  WBC 12.1* 11.6* 11.8*  PLT 171 160 190   No results found.   Intake/Output Summary (Last 24 hours) at 07/26/16 0954 Last data filed at 07/26/16 8295  Gross per 24 hour  Intake              360 ml  Output             1400 ml  Net            -1040 ml   ASSESSMENT / PLAN:  Acute hypoxic respiratory failure AECOPD (presumptive) w/ acute purulent bronchitis +/- CAP (NOS)supported by CT scan Post nasal gtt Cough GERD Acute on chronic diastolic heart failure [place]and ischemia Pulmonary edema CRI H/o CAD DM HA  Acute hypoxic respiratory failure in setting of what is likely AECOPD +/- CAP (NOS) vs acute bronchitis, complicated by persistent post-nasal  gtt, cough and upper airway irritation as well as volume excess (from acute on chronic diastolic HF).  -his cough and wheezing are his major complaints. Otherwise he is actually feeling better. He reports these symptoms are at their worst when he is in the supine position.  Plan Continue to hold diuresis but may need to restart as creatine improves. He is auto diuresing 6/1 Cont inhaled BDs Cont supplemental oxygen , change to [place] and wean as tolerated Changed to prednisone 6/1 Cont levaquin day 6/5 consider dc  Check procal 5/30 <0.10 Add nasal steroid, Oral antihistamine and increase PPI Would benefit from PFTs at some point as states he typically has activity  limitations d/t shortness of breath but it wont change anything in 83 yo. Ambulate on Room air if sats <88% will need home O2.  Richardson Landry Minor ACNP Maryanna Shape PCCM Pager [phone] till 3 pm If no answer page [phone] 07/26/2016, 9:54 AM  Attending Note:  I have examined patient, reviewed labs, studies and notes. I have discussed the case with S Minor, and I agree with the data and plans as amended above. 83 yo man with CAD / CABG, presumed COPD, admitted with CAP and suspected bronchospasm. Has improved but has a new oxygen requirement. On my eval he is improved, stronger, feels a bit less SOB. No change in lung exam - no wheeze but he does have some rhonchi. Should be able to stop levaquin. Would taper prednisone. Walking oximetry to assess how much O2 he needs for home.    Baltazar Apo, MD, PhD 07/26/2016, 1:44 PM [facility] [phone] or if no answer [phone]

## 2016-07-27 ENCOUNTER — Emergency Department (HOSPITAL_COMMUNITY): Payer: Medicare Other

## 2016-07-27 ENCOUNTER — Encounter (HOSPITAL_COMMUNITY): Payer: Self-pay | Admitting: Emergency Medicine

## 2016-07-27 ENCOUNTER — Inpatient Hospital Stay (HOSPITAL_COMMUNITY)
Admission: EM | Admit: 2016-07-27 | Discharge: 2016-07-30 | DRG: 190 | Disposition: A | Discharge status: Skilled Nursing Facility | Payer: Medicare Other | Attending: Internal Medicine | Admitting: Internal Medicine

## 2016-07-27 DIAGNOSIS — R531 Weakness: Secondary | ICD-10-CM | POA: Diagnosis not present

## 2016-07-27 DIAGNOSIS — Z79899 Other long term (current) drug therapy: Secondary | ICD-10-CM

## 2016-07-27 DIAGNOSIS — I130 Hypertensive heart and chronic kidney disease with heart failure and stage 1 through stage 4 chronic kidney disease, or unspecified chronic kidney disease: Secondary | ICD-10-CM | POA: Diagnosis present

## 2016-07-27 DIAGNOSIS — Z7952 Long term (current) use of systemic steroids: Secondary | ICD-10-CM

## 2016-07-27 DIAGNOSIS — J441 Chronic obstructive pulmonary disease with (acute) exacerbation: Secondary | ICD-10-CM | POA: Diagnosis not present

## 2016-07-27 DIAGNOSIS — N183 Chronic kidney disease, stage 3 (moderate): Secondary | ICD-10-CM | POA: Diagnosis present

## 2016-07-27 DIAGNOSIS — I447 Left bundle-branch block, unspecified: Secondary | ICD-10-CM | POA: Diagnosis present

## 2016-07-27 DIAGNOSIS — I10 Essential (primary) hypertension: Secondary | ICD-10-CM | POA: Diagnosis present

## 2016-07-27 DIAGNOSIS — Z880 Allergy status to penicillin: Secondary | ICD-10-CM

## 2016-07-27 DIAGNOSIS — Z66 Do not resuscitate: Secondary | ICD-10-CM | POA: Diagnosis present

## 2016-07-27 DIAGNOSIS — J9621 Acute and chronic respiratory failure with hypoxia: Secondary | ICD-10-CM | POA: Diagnosis present

## 2016-07-27 DIAGNOSIS — Z951 Presence of aortocoronary bypass graft: Secondary | ICD-10-CM | POA: Diagnosis present

## 2016-07-27 DIAGNOSIS — D693 Immune thrombocytopenic purpura: Secondary | ICD-10-CM | POA: Diagnosis not present

## 2016-07-27 DIAGNOSIS — J40 Bronchitis, not specified as acute or chronic: Secondary | ICD-10-CM | POA: Diagnosis present

## 2016-07-27 DIAGNOSIS — J189 Pneumonia, unspecified organism: Secondary | ICD-10-CM | POA: Diagnosis not present

## 2016-07-27 DIAGNOSIS — J440 Chronic obstructive pulmonary disease with acute lower respiratory infection: Principal | ICD-10-CM | POA: Diagnosis present

## 2016-07-27 DIAGNOSIS — J9601 Acute respiratory failure with hypoxia: Secondary | ICD-10-CM | POA: Diagnosis not present

## 2016-07-27 DIAGNOSIS — J121 Respiratory syncytial virus pneumonia: Secondary | ICD-10-CM | POA: Diagnosis not present

## 2016-07-27 DIAGNOSIS — Z87891 Personal history of nicotine dependence: Secondary | ICD-10-CM

## 2016-07-27 DIAGNOSIS — I5033 Acute on chronic diastolic (congestive) heart failure: Secondary | ICD-10-CM | POA: Diagnosis not present

## 2016-07-27 DIAGNOSIS — R0902 Hypoxemia: Secondary | ICD-10-CM | POA: Diagnosis not present

## 2016-07-27 DIAGNOSIS — N179 Acute kidney failure, unspecified: Secondary | ICD-10-CM | POA: Diagnosis present

## 2016-07-27 DIAGNOSIS — R0603 Acute respiratory distress: Secondary | ICD-10-CM | POA: Diagnosis not present

## 2016-07-27 DIAGNOSIS — R0602 Shortness of breath: Secondary | ICD-10-CM | POA: Diagnosis not present

## 2016-07-27 DIAGNOSIS — E1140 Type 2 diabetes mellitus with diabetic neuropathy, unspecified: Secondary | ICD-10-CM | POA: Diagnosis present

## 2016-07-27 DIAGNOSIS — E1122 Type 2 diabetes mellitus with diabetic chronic kidney disease: Secondary | ICD-10-CM | POA: Diagnosis not present

## 2016-07-27 DIAGNOSIS — Z888 Allergy status to other drugs, medicaments and biological substances status: Secondary | ICD-10-CM

## 2016-07-27 DIAGNOSIS — K219 Gastro-esophageal reflux disease without esophagitis: Secondary | ICD-10-CM | POA: Diagnosis present

## 2016-07-27 DIAGNOSIS — J209 Acute bronchitis, unspecified: Secondary | ICD-10-CM | POA: Diagnosis present

## 2016-07-27 DIAGNOSIS — Z96641 Presence of right artificial hip joint: Secondary | ICD-10-CM | POA: Diagnosis present

## 2016-07-27 DIAGNOSIS — I2510 Atherosclerotic heart disease of native coronary artery without angina pectoris: Secondary | ICD-10-CM | POA: Diagnosis present

## 2016-07-27 DIAGNOSIS — N400 Enlarged prostate without lower urinary tract symptoms: Secondary | ICD-10-CM | POA: Diagnosis present

## 2016-07-27 DIAGNOSIS — Z9981 Dependence on supplemental oxygen: Secondary | ICD-10-CM

## 2016-07-27 DIAGNOSIS — E785 Hyperlipidemia, unspecified: Secondary | ICD-10-CM | POA: Diagnosis present

## 2016-07-27 LAB — I-STAT CG4 LACTIC ACID, ED: Lactic Acid, Venous: 0.89 mmol/L (ref 0.5–1.9)

## 2016-07-27 LAB — CBG MONITORING, ED: GLUCOSE-CAPILLARY: 206 mg/dL — AB (ref 65–99)

## 2016-07-27 LAB — URINALYSIS, ROUTINE W REFLEX MICROSCOPIC
Bilirubin Urine: NEGATIVE
Glucose, UA: NEGATIVE mg/dL
HGB URINE DIPSTICK: NEGATIVE
Ketones, ur: NEGATIVE mg/dL
Leukocytes, UA: NEGATIVE
Nitrite: NEGATIVE
Protein, ur: NEGATIVE mg/dL
SPECIFIC GRAVITY, URINE: 1.019 (ref 1.005–1.030)
pH: 5 (ref 5.0–8.0)

## 2016-07-27 LAB — CBC
HCT: 40 % (ref 39.0–52.0)
HEMOGLOBIN: 13 g/dL (ref 13.0–17.0)
MCH: 30.5 pg (ref 26.0–34.0)
MCHC: 32.5 g/dL (ref 30.0–36.0)
MCV: 93.9 fL (ref 78.0–100.0)
Platelets: 174 10*3/uL (ref 150–400)
RBC: 4.26 MIL/uL (ref 4.22–5.81)
RDW: 12.5 % (ref 11.5–15.5)
WBC: 11.8 10*3/uL — ABNORMAL HIGH (ref 4.0–10.5)

## 2016-07-27 LAB — BASIC METABOLIC PANEL
ANION GAP: 8 (ref 5–15)
BUN: 53 mg/dL — ABNORMAL HIGH (ref 6–20)
CHLORIDE: 103 mmol/L (ref 101–111)
CO2: 23 mmol/L (ref 22–32)
Calcium: 9.1 mg/dL (ref 8.9–10.3)
Creatinine, Ser: 1.69 mg/dL — ABNORMAL HIGH (ref 0.61–1.24)
GFR calc non Af Amer: 33 mL/min — ABNORMAL LOW (ref >60)
GFR, EST AFRICAN AMERICAN: 38 mL/min — AB (ref >60)
Glucose, Bld: 204 mg/dL — ABNORMAL HIGH (ref 65–99)
POTASSIUM: 4.9 mmol/L (ref 3.5–5.1)
Sodium: 134 mmol/L — ABNORMAL LOW (ref 135–145)

## 2016-07-27 LAB — GLUCOSE, CAPILLARY: Glucose-Capillary: 283 mg/dL — ABNORMAL HIGH (ref 65–99)

## 2016-07-27 LAB — BRAIN NATRIURETIC PEPTIDE: B Natriuretic Peptide: 173.6 pg/mL — ABNORMAL HIGH (ref 0.0–100.0)

## 2016-07-27 MED ORDER — ONDANSETRON HCL 4 MG PO TABS: 4.0000 mg | ORAL_TABLET | Freq: Four times a day (QID) | ORAL | Status: DC | PRN

## 2016-07-27 MED ORDER — METHYLPREDNISOLONE SODIUM SUCC 125 MG IJ SOLR
60.0000 mg | Freq: Two times a day (BID) | INTRAMUSCULAR | Status: DC
  Administered 2016-07-27 – 2016-07-29 (×4): 60 mg via INTRAVENOUS
  Filled 2016-07-27 (×4): qty 2

## 2016-07-27 MED ORDER — PREDNISONE 20 MG PO TABS
40.0000 mg | ORAL_TABLET | Freq: Once | ORAL | Status: DC
  Filled 2016-07-27: qty 2

## 2016-07-27 MED ORDER — METOPROLOL TARTRATE 25 MG PO TABS: 6.2500 mg | ORAL_TABLET | Freq: Two times a day (BID) | ORAL | Status: DC

## 2016-07-27 MED ORDER — ONDANSETRON HCL 4 MG/2ML IJ SOLN: 4.0000 mg | Freq: Four times a day (QID) | INTRAMUSCULAR | Status: DC | PRN

## 2016-07-27 MED ORDER — SIMVASTATIN 20 MG PO TABS
20.0000 mg | ORAL_TABLET | Freq: Every day | ORAL | Status: DC
  Administered 2016-07-27 – 2016-07-29 (×3): 20 mg via ORAL
  Filled 2016-07-27 (×3): qty 1

## 2016-07-27 MED ORDER — METOPROLOL TARTRATE 25 MG/10 ML ORAL SUSPENSION
6.2500 mg | Freq: Two times a day (BID) | ORAL | Status: DC
  Administered 2016-07-27 – 2016-07-28 (×3): 6.25 mg via ORAL
  Administered 2016-07-29: 3.125 mg via ORAL
  Administered 2016-07-29: 6.25 mg via ORAL
  Filled 2016-07-27 (×6): qty 10

## 2016-07-27 MED ORDER — DOCUSATE SODIUM 100 MG PO CAPS
100.0000 mg | ORAL_CAPSULE | Freq: Two times a day (BID) | ORAL | Status: DC
  Administered 2016-07-27 – 2016-07-30 (×5): 100 mg via ORAL
  Filled 2016-07-27 (×6): qty 1

## 2016-07-27 MED ORDER — MAGNESIUM OXIDE 400 MG PO TABS: 400.0000 mg | ORAL_TABLET | Freq: Every day | ORAL | Status: DC

## 2016-07-27 MED ORDER — SENNOSIDES-DOCUSATE SODIUM 8.6-50 MG PO TABS
1.0000 | ORAL_TABLET | Freq: Every day | ORAL | Status: DC
  Administered 2016-07-27 – 2016-07-29 (×3): 1 via ORAL
  Filled 2016-07-27 (×3): qty 1

## 2016-07-27 MED ORDER — INSULIN ASPART 100 UNIT/ML ~~LOC~~ SOLN
0.0000 [IU] | Freq: Every day | SUBCUTANEOUS | Status: DC
  Administered 2016-07-27 – 2016-07-28 (×2): 3 [IU] via SUBCUTANEOUS
  Administered 2016-07-29: 2 [IU] via SUBCUTANEOUS

## 2016-07-27 MED ORDER — HYPROMELLOSE (GONIOSCOPIC) 2.5 % OP SOLN
1.0000 [drp] | Freq: Three times a day (TID) | OPHTHALMIC | Status: DC | PRN
  Administered 2016-07-28: 1 [drp] via OPHTHALMIC
  Filled 2016-07-27 (×3): qty 15

## 2016-07-27 MED ORDER — IPRATROPIUM BROMIDE 0.02 % IN SOLN
0.5000 mg | Freq: Once | RESPIRATORY_TRACT | Status: AC
  Administered 2016-07-27: 0.5 mg via RESPIRATORY_TRACT
  Filled 2016-07-27: qty 2.5

## 2016-07-27 MED ORDER — FLUTICASONE PROPIONATE 50 MCG/ACT NA SUSP
2.0000 | Freq: Two times a day (BID) | NASAL | Status: DC
  Filled 2016-07-27: qty 16

## 2016-07-27 MED ORDER — ACETAMINOPHEN 500 MG PO TABS: 250.0000 mg | ORAL_TABLET | Freq: Four times a day (QID) | ORAL | Status: DC | PRN

## 2016-07-27 MED ORDER — HEPARIN SODIUM (PORCINE) 5000 UNIT/ML IJ SOLN
5000.0000 [IU] | Freq: Three times a day (TID) | INTRAMUSCULAR | Status: DC
  Administered 2016-07-27 – 2016-07-30 (×8): 5000 [IU] via SUBCUTANEOUS
  Filled 2016-07-27 (×8): qty 1

## 2016-07-27 MED ORDER — ALBUTEROL SULFATE (2.5 MG/3ML) 0.083% IN NEBU: 2.5000 mg | INHALATION_SOLUTION | RESPIRATORY_TRACT | Status: DC | PRN

## 2016-07-27 MED ORDER — FUROSEMIDE 10 MG/ML IJ SOLN
60.0000 mg | Freq: Once | INTRAMUSCULAR | Status: AC
  Administered 2016-07-27: 60 mg via INTRAVENOUS
  Filled 2016-07-27: qty 6

## 2016-07-27 MED ORDER — IPRATROPIUM-ALBUTEROL 0.5-2.5 (3) MG/3ML IN SOLN
3.0000 mL | Freq: Four times a day (QID) | RESPIRATORY_TRACT | Status: DC
  Administered 2016-07-27 – 2016-07-29 (×9): 3 mL via RESPIRATORY_TRACT
  Filled 2016-07-27 (×9): qty 3

## 2016-07-27 MED ORDER — LEVOFLOXACIN 500 MG PO TABS
250.0000 mg | ORAL_TABLET | Freq: Every day | ORAL | Status: DC
  Administered 2016-07-27 – 2016-07-30 (×4): 250 mg via ORAL
  Filled 2016-07-27 (×4): qty 1

## 2016-07-27 MED ORDER — INSULIN ASPART 100 UNIT/ML ~~LOC~~ SOLN
0.0000 [IU] | Freq: Three times a day (TID) | SUBCUTANEOUS | Status: DC
  Administered 2016-07-28: 3 [IU] via SUBCUTANEOUS
  Administered 2016-07-28: 5 [IU] via SUBCUTANEOUS
  Administered 2016-07-28: 3 [IU] via SUBCUTANEOUS
  Administered 2016-07-29: 5 [IU] via SUBCUTANEOUS
  Administered 2016-07-29 (×2): 3 [IU] via SUBCUTANEOUS
  Administered 2016-07-30: 2 [IU] via SUBCUTANEOUS

## 2016-07-27 MED ORDER — ALBUTEROL SULFATE (2.5 MG/3ML) 0.083% IN NEBU
5.0000 mg | INHALATION_SOLUTION | Freq: Once | RESPIRATORY_TRACT | Status: AC
  Administered 2016-07-27: 5 mg via RESPIRATORY_TRACT
  Filled 2016-07-27: qty 6

## 2016-07-27 MED ORDER — ALBUTEROL SULFATE (2.5 MG/3ML) 0.083% IN NEBU
2.5000 mg | INHALATION_SOLUTION | Freq: Once | RESPIRATORY_TRACT | Status: AC
  Administered 2016-07-27: 2.5 mg via RESPIRATORY_TRACT
  Filled 2016-07-27: qty 3

## 2016-07-27 MED ORDER — PANTOPRAZOLE SODIUM 40 MG PO TBEC
40.0000 mg | DELAYED_RELEASE_TABLET | Freq: Every day | ORAL | Status: DC
  Administered 2016-07-28 – 2016-07-30 (×3): 40 mg via ORAL
  Filled 2016-07-27 (×3): qty 1

## 2016-07-27 MED ORDER — NITROGLYCERIN 0.4 MG SL SUBL: 0.4000 mg | SUBLINGUAL_TABLET | SUBLINGUAL | Status: DC | PRN

## 2016-07-27 MED ORDER — GUAIFENESIN ER 600 MG PO TB12
600.0000 mg | ORAL_TABLET | Freq: Two times a day (BID) | ORAL | Status: DC
  Administered 2016-07-27 – 2016-07-30 (×6): 600 mg via ORAL
  Filled 2016-07-27 (×6): qty 1

## 2016-07-27 NOTE — ED Provider Notes (Signed)
[facility] DEPT Provider Note   CSN: [id number] Arrival date & time: 07/27/16  1316     History   Chief Complaint Chief Complaint  Patient presents with  . Fatigue    HPI Lawrence Warner is a 83 y.o. male with history of COPD, chronic renal insufficiency, CAD, DM type II, HTN, HLD who presents a with chief complaint progressively worsening generalized fatigue and shortness of breath. He was discharged from here yesterday for management of bronchopneumonia and COPD exacerbation. He states that he was feeling much better at the time, returned to his assisted living facility, and rested last night. He notes he awoke this morning and ate breakfast which he tolerated without difficulty, when generalized weakness developed after that. Per patient's son, "the assisted living facility staff said that he wasn't making any sense earlier today ". He states that he still has a cough which is intermittently productive of yellow sputum but states that this has improved significantly since his initial evaluation 1 week ago and subsequent hospitalization.He also endorses orthopnea. He has had new medications including Levaquin, Mucinex, and dexamethasone which he has been taking as prescribed. He was also placed on continuous oxygen 2 L via nasal cannula at home after his discharge from the hospital. He denies fever, chills, abdominal pain, dysuria, hematuria, melena, chest pain, leg swelling. The history is provided by the patient and a relative.    Past Medical History:  Diagnosis Date  . BPH (benign prostatic hypertrophy)   . Chronic renal insufficiency   . Coronary artery disease    CABG in 2000  . Diabetes type 2, controlled ([place])    Diet-controlled  . Diabetic neuropathy ([place])   . Dyslipidemia   . Hypertension   . ITP (idiopathic thrombocytopenic purpura)   . Osteoarthritis     Patient Active Problem List   Diagnosis Date Noted  . Hypoxia   . Acute respiratory failure ([place]) 07/20/2016   . Respiratory failure ([place]) 02/28/2016  . Acute respiratory failure with hypoxemia ([place]) 02/28/2016  . HLD (hyperlipidemia) 02/28/2016  . AKI (acute kidney injury) ([place])   . Influenza   . Sepsis ([place])   . Constipation 12/07/2015  . Bradycardia 11/28/2015  . Port catheter in place 10/24/2015  . GERD (gastroesophageal reflux disease) 10/09/2015  . Gait instability 10/09/2015  . Chest pain 10/05/2015  . Diet-controlled diabetes mellitus ([place]) 05/07/2015  . CAD (coronary artery disease) 05/07/2015  . Essential hypertension 05/07/2015  . Muscle cramps 04/25/2015  . Shortness of breath 04/11/2015  . Bronchitis 03/14/2015  . Idiopathic thrombocytopenic purpura ([place]) 09/27/2014    Past Surgical History:  Procedure Laterality Date  . CHOLECYSTECTOMY    . CORONARY ARTERY BYPASS GRAFT  2000  . Right hip replacement  2014  . TONSILLECTOMY         Home Medications    Prior to Admission medications   Medication Sig Start Date End Date Taking? Authorizing Provider  acetaminophen (TYLENOL) 500 MG tablet Take 250-1,000 mg by mouth every 6 (six) hours as needed for mild pain, moderate pain or headache.     [provider]  fluticasone (FLONASE) 50 MCG/ACT nasal spray Place 2 sprays into both nostrils 2 (two) times daily. For 7days 07/26/16   Domenic Polite, MD  guaiFENesin (MUCINEX) 600 MG 12 hr tablet Take 1 tablet (600 mg total) by mouth 2 (two) times daily. For 5days 07/26/16   Domenic Polite, MD  hydroxypropyl methylcellulose / hypromellose (ISOPTO TEARS / GONIOVISC) 2.5 % ophthalmic solution Place  1 drop into both eyes 3 (three) times daily as needed for dry eyes.    [provider]  levofloxacin (LEVAQUIN) 250 MG tablet Take 1 tablet (250 mg total) by mouth daily. For 3days 07/27/16   Domenic Polite, MD  magnesium oxide (MAG-OX) 400 MG tablet Take 400 mg by mouth at bedtime.    [provider]  metoprolol tartrate (LOPRESSOR) 25 MG tablet Take 0.5 tablets (12.5  mg total) by mouth 2 (two) times daily. Patient taking differently: Take 6.25 mg by mouth 2 (two) times daily.  10/09/15   Hoyt Koch, MD  Multiple Vitamin (MULTIVITAMIN WITH MINERALS) TABS tablet Take 1 tablet by mouth daily.    [provider]  nitroGLYCERIN (NITROSTAT) 0.4 MG SL tablet Place 1 tablet (0.4 mg total) under the tongue every 5 (five) minutes as needed for chest pain. 12/13/15 07/20/16  Minus Breeding, MD  omeprazole (PRILOSEC) 20 MG capsule Take 1 capsule (20 mg total) by mouth 2 (two) times daily before a meal. 07/26/16   Domenic Polite, MD  predniSONE (DELTASONE) 20 MG tablet Take 1-2 tablets (20-40 mg total) by mouth daily with breakfast. Take 40mg  daily for 2days then 20mg  daily for 2days then STOP 07/26/16   Domenic Polite, MD  simvastatin (ZOCOR) 20 MG tablet Take 20 mg by mouth at bedtime.     [provider]    Family History Family History  Problem Relation Age of Onset  . Lung disease Father   . Cancer Brother     Social History Social History  Substance Use Topics  . Smoking status: Former Smoker    Packs/day: 2.00    Years: 25.00  . Smokeless tobacco: Never Used  . Alcohol use No     Allergies   Alfuzosin hcl er and Penicillins   Review of Systems Review of Systems  Constitutional: Positive for fatigue. Negative for chills and fever.  Eyes: Negative for visual disturbance.  Respiratory: Positive for shortness of breath.   Cardiovascular: Negative for chest pain and leg swelling.  Gastrointestinal: Negative for abdominal pain, constipation, diarrhea, nausea and vomiting.  Genitourinary: Negative for dysuria, flank pain and hematuria.  Neurological: Positive for weakness. Negative for syncope, light-headedness and headaches.  Psychiatric/Behavioral: Positive for confusion.  All other systems reviewed and are negative.    Physical Exam Updated Vital Signs BP 139/60   Pulse (!) 52   Temp 98.6 F (37 C) (Oral)   Resp  15   Ht 5\' 7"  (1.702 m)   Wt 92 kg (202 lb 13.2 oz)   SpO2 97%   BMI 31.77 kg/m   Physical Exam  Constitutional: He is oriented to person, place, and time. He appears well-developed and well-nourished. No distress.  HENT:  Head: Normocephalic and atraumatic.  Eyes: Conjunctivae are normal. Pupils are equal, round, and reactive to light. Right eye exhibits no discharge. Left eye exhibits no discharge. No scleral icterus.  Neck: No JVD present. No tracheal deviation present.  Neck range of motion severely limited, patient states this is chronic secondary to cervical spine fusion.  Cardiovascular: Normal rate and intact distal pulses.   Distant heart sounds, 2+ radial and DP/PT pulses bl, negative Homan's bl   Pulmonary/Chest:  Increased work of breathing, equal rise and fall of chest. Diffuse inspiratory and expiratory wheezing with scattered rales and rhonchi noted  Abdominal: Soft. Bowel sounds are normal. He exhibits distension. There is tenderness.  Lower abdomen mildly tender to palpation. Murphy's absent, Rovsing's absent, no  tenderness to palpation at McBurney's point.  Musculoskeletal: He exhibits no edema or tenderness.  Limited active range of motion. 5/5 strength of BUE and BLE   Neurological: He is alert and oriented to person, place, and time. No sensory deficit.  Fluent speech, no facial droop, sensation to light touch intact generally. No pronator drift. Unable to ambulate due to generalized weakness.  Skin: Skin is warm and dry. Capillary refill takes less than 2 seconds.  Psychiatric: He has a normal mood and affect. His behavior is normal.     ED Treatments / Results  Labs (all labs ordered are listed, but only abnormal results are displayed) Labs Reviewed  BASIC METABOLIC PANEL - Abnormal; Notable for the following:       Result Value   Sodium 134 (*)    Glucose, Bld 204 (*)    BUN 53 (*)    Creatinine, Ser 1.69 (*)    GFR calc non Af Amer 33 (*)    GFR calc  Af Amer 38 (*)    All other components within normal limits  CBC - Abnormal; Notable for the following:    WBC 11.8 (*)    All other components within normal limits  CBG MONITORING, ED - Abnormal; Notable for the following:    Glucose-Capillary 206 (*)    All other components within normal limits  URINALYSIS, ROUTINE W REFLEX MICROSCOPIC  I-STAT CG4 LACTIC ACID, ED    EKG  EKG Interpretation  Date/Time:  Saturday July 27 2016 13:33:50 EDT Ventricular Rate:  65 PR Interval:    QRS Duration: 166 QT Interval:  429 QTC Calculation: 447 R Axis:   -55 Text Interpretation:  Sinus rhythm Prolonged PR interval Probable left atrial enlargement Left bundle branch block similar to prior EKGs Confirmed by Malvin Johns [phone]) on 07/27/2016 1:39:47 PM Also confirmed by Malvin Johns [phone]), editor Verna Czech [phone])  on 07/27/2016 2:50:38 PM       Radiology Dg Chest 2 View  Result Date: 07/27/2016 CLINICAL DATA:  Shortness of breath EXAM: CHEST  2 VIEW COMPARISON:  07/23/2016 FINDINGS: Low volume film. The cardio pericardial silhouette is enlarged. Bibasilar atelectasis or scarring again noted. Patient is status post median sternotomy. Bones are diffusely demineralized. Telemetry leads overlie the chest. IMPRESSION: Low volume with chronic interstitial coarsening in basilar atelectasis or scarring. Electronically Signed   By: Misty Stanley M.D.   On: 07/27/2016 15:27    Procedures Procedures (including critical care time)  Medications Ordered in ED Medications  albuterol (PROVENTIL) (2.5 MG/3ML) 0.083% nebulizer solution 2.5 mg (not administered)  predniSONE (DELTASONE) tablet 40 mg (not administered)  albuterol (PROVENTIL) (2.5 MG/3ML) 0.083% nebulizer solution 5 mg (5 mg Nebulization Given 07/27/16 1550)  ipratropium (ATROVENT) nebulizer solution 0.5 mg (0.5 mg Nebulization Given 07/27/16 1550)     Initial Impression / Assessment and Plan / ED Course  I have reviewed the triage vital  signs and the nursing notes.  Pertinent labs & imaging results that were available during my care of the patient were reviewed by me and considered in my medical decision making (see chart for details).     Patient with COPD presents with generalized weakness and persistent productive cough. Afebrile, SPO2 92% on 2L via [place] while in the ED. No focal neurological deficits noted. Increased work of breathing, speaking in short sentences on my examination. DuoNeb treatment given, without significant improvement in wheezing or other adventitious sounds. Chest x-ray without definitive signs of infiltrate or consolidation. EKG  without significant changes from last. Subsequently given albuterol and prednisone. With persistent shortness of breath, suspect respiratory failure due to COPD exacerbation from possible bronchitis.  Low suspicion of CVA or ACS/MI. Consulted hospitalist service, spoke with Dr. Candiss Norse who will assume care of patient and bring him into the hospital for further evaluation and management.  Final Clinical Impressions(s) / ED Diagnoses   Final diagnoses:  Acute respiratory distress  Generalized weakness    New Prescriptions New Prescriptions   No medications on file     Debroah Baller 07/27/16 1701    Malvin Johns, MD 07/27/16 1720

## 2016-07-27 NOTE — Progress Notes (Signed)
Received report on pt.

## 2016-07-27 NOTE — H&P (Addendum)
TRH H&P   Patient Demographics:    Lawrence Warner, is a 83 y.o. male  MRN: [id number]   DOB - 26-Apr-1918  Admit Date - 07/27/2016  Outpatient Primary MD for the patient is Hoyt Koch, MD      Patient coming from: Home/apartment  Chief Complaint  Patient presents with  . Fatigue      HPI:    Lawrence Warner  is a 83 y.o. male, History of COPD on 2 L nasal cannula home oxygen, CAD post CABG in 2000, left bundle branch block, DM type II, essential hypertension, ITP, BPH, chronic kidney disease stage III baseline creatinine close to 1.5, patient was recently admitted for pneumonia, was discharged yesterday to his home after he got much better per patient and his daughter-in-law, patient since last night started getting progressively weak along with mildly worsening productive cough and shortness of breath. Came back to the ER where he was found to be extremely weak and deconditioned, had productive cough suggestive of acute bronchitis, had a few rales on exam suggestive of mild CHF. I was called to admit.    Review of systems:    In addition to the HPI above,   No Fever-chills, No Headache, No changes with Vision or hearing, No problems swallowing food or Liquids, No Chest pain, Positive productive cough, orthopnea and shortness of breath, No Abdominal pain, No Nausea or Vommitting, Bowel movements are regular, No Blood in stool or Urine, No dysuria, No new skin rashes or bruises, No new joints pains-aches,  No new weakness, tingling, numbness in any extremity,Positive generalized weakness, No recent weight gain or loss, No polyuria, polydypsia or polyphagia, No significant Mental Stressors.  A  full 10 point Review of Systems was done, except as stated above, all other Review of Systems were negative.   With Past History of the following :    Past Medical History:  Diagnosis Date  . BPH (benign prostatic hypertrophy)   . Chronic renal insufficiency   . Coronary artery disease    CABG in 2000  . Diabetes type 2, controlled ([place])    Diet-controlled  . Diabetic neuropathy ([place])   . Dyslipidemia   . Hypertension   . ITP (idiopathic thrombocytopenic purpura)   . Osteoarthritis       Past Surgical History:  Procedure Laterality Date  .  CHOLECYSTECTOMY    . CORONARY ARTERY BYPASS GRAFT  2000  . Right hip replacement  2014  . TONSILLECTOMY        Social History:     Social History  Substance Use Topics  . Smoking status: Former Smoker    Packs/day: 2.00    Years: 25.00  . Smokeless tobacco: Never Used  . Alcohol use No         Family History :     Family History  Problem Relation Age of Onset  . Lung disease Father   . Cancer Brother         Home Medications:   Prior to Admission medications   Medication Sig Start Date End Date Taking? Authorizing Provider  acetaminophen (TYLENOL) 500 MG tablet Take 250-1,000 mg by mouth every 6 (six) hours as needed for mild pain, moderate pain or headache.     [provider]  fluticasone (FLONASE) 50 MCG/ACT nasal spray Place 2 sprays into both nostrils 2 (two) times daily. For 7days 07/26/16   Domenic Polite, MD  guaiFENesin (MUCINEX) 600 MG 12 hr tablet Take 1 tablet (600 mg total) by mouth 2 (two) times daily. For 5days 07/26/16   Domenic Polite, MD  hydroxypropyl methylcellulose / hypromellose (ISOPTO TEARS / GONIOVISC) 2.5 % ophthalmic solution Place 1 drop into both eyes 3 (three) times daily as needed for dry eyes.    [provider]  levofloxacin (LEVAQUIN) 250 MG tablet Take 1 tablet (250 mg total) by mouth daily. For 3days 07/27/16   Domenic Polite, MD  magnesium oxide (MAG-OX) 400 MG tablet  Take 400 mg by mouth at bedtime.    [provider]  metoprolol tartrate (LOPRESSOR) 25 MG tablet Take 0.5 tablets (12.5 mg total) by mouth 2 (two) times daily. Patient taking differently: Take 6.25 mg by mouth 2 (two) times daily.  10/09/15   Hoyt Koch, MD  Multiple Vitamin (MULTIVITAMIN WITH MINERALS) TABS tablet Take 1 tablet by mouth daily.    [provider]  nitroGLYCERIN (NITROSTAT) 0.4 MG SL tablet Place 1 tablet (0.4 mg total) under the tongue every 5 (five) minutes as needed for chest pain. 12/13/15 07/20/16  Minus Breeding, MD  omeprazole (PRILOSEC) 20 MG capsule Take 1 capsule (20 mg total) by mouth 2 (two) times daily before a meal. 07/26/16   Domenic Polite, MD  predniSONE (DELTASONE) 20 MG tablet Take 1-2 tablets (20-40 mg total) by mouth daily with breakfast. Take 40mg  daily for 2days then 20mg  daily for 2days then STOP 07/26/16   Domenic Polite, MD  simvastatin (ZOCOR) 20 MG tablet Take 20 mg by mouth at bedtime.     [provider]     Allergies:     Allergies  Allergen Reactions  . Alfuzosin Hcl Er Other (See Comments)    confusion  . Penicillins Hives and Other (See Comments)    Has patient had a PCN reaction causing immediate rash, facial/tongue/throat swelling, SOB or lightheadedness with hypotension: No Has patient had a PCN reaction causing severe rash involving mucus membranes or skin necrosis: No Has patient had a PCN reaction that required hospitalization No Has patient had a PCN reaction occurring within the last 10 years: No If all of the above answers are "NO", then may proceed with Cephalosporin use.     Physical Exam:   Vitals  Blood pressure 139/60, pulse (!) 52, temperature 98.6 F (37 C), temperature source Oral, resp. rate 15, height 5\' 7"  (1.702 m), weight  92 kg (202 lb 13.2 oz), SpO2 97 %.   1. General Elderly white male sitting in hospital bed in bed in NAD,     2. Normal affect and insight, Not Suicidal or  Homicidal, Awake Alert, Oriented X 3.  3. No F.N deficits, ALL C.Nerves Intact, Strength 5/5 all 4 extremities, Sensation intact all 4 extremities, Plantars down going.  4. Ears and Eyes appear Normal, Conjunctivae clear, PERRLA. Moist Oral Mucosa.  5. Supple Neck, No JVD, No cervical lymphadenopathy appriciated, No Carotid Bruits.  6. Symmetrical Chest wall movement, Good air movement bilaterally, bilateral coarse breath sounds with mild wheezing and rales  7. RRR, No Gallops, Rubs or Murmurs, No Parasternal Heave.  8. Positive Bowel Sounds, Abdomen Soft, No tenderness, No organomegaly appriciated,No rebound -guarding or rigidity.  9.  No Cyanosis, Normal Skin Turgor, No Skin Rash or Bruise.  10. Good muscle tone,  joints appear normal , no effusions, Normal ROM.  11. No Palpable Lymph Nodes in Neck or Axillae      Data Review:    CBC  Recent Labs Lab 07/23/16 0613 07/24/16 0444 07/25/16 0332 07/26/16 0635 07/27/16 1350  WBC 16.0* 12.1* 11.6* 11.8* 11.8*  HGB 13.6 12.9* 12.4* 13.5 13.0  HCT 43.1 41.1 39.2 41.6 40.0  PLT 189 171 160 190 174  MCV 96.9 97.4 96.3 96.1 93.9  MCH 30.6 30.6 30.5 31.2 30.5  MCHC 31.6 31.4 31.6 32.5 32.5  RDW 13.3 13.4 12.9 12.6 12.5   ------------------------------------------------------------------------------------------------------------------  Chemistries   Recent Labs Lab 07/23/16 0613 07/24/16 0444 07/25/16 0332 07/26/16 0635 07/27/16 1350  NA 138 137 136 137 134*  K 4.3 4.4 4.6 4.1 4.9  CL 103 102 100* 100* 103  CO2 24 26 27 27 23   GLUCOSE 160* 161* 141* 122* 204*  BUN 50* 62* 63* 60* 53*  CREATININE 1.71* 2.02* 1.97* 1.95* 1.69*  CALCIUM 9.8 9.4 9.1 9.5 9.1   ------------------------------------------------------------------------------------------------------------------ estimated creatinine clearance is 28.3 mL/min (A) (by C-G formula based on SCr of 1.69 mg/dL  (H)). ------------------------------------------------------------------------------------------------------------------ No results for input(s): TSH, T4TOTAL, T3FREE, THYROIDAB in the last 72 hours.  Invalid input(s): FREET3  Coagulation profile No results for input(s): INR, PROTIME in the last 168 hours. ------------------------------------------------------------------------------------------------------------------- No results for input(s): DDIMER in the last 72 hours. -------------------------------------------------------------------------------------------------------------------  Cardiac Enzymes  Recent Labs Lab 07/20/16 2232 07/21/16 0345  TROPONINI 0.04* 0.04*   ------------------------------------------------------------------------------------------------------------------    Component Value Date/Time   BNP 256.0 (H) 07/20/2016 1540     ---------------------------------------------------------------------------------------------------------------  Urinalysis    Component Value Date/Time   COLORURINE YELLOW 02/28/2016 1318   APPEARANCEUR HAZY (A) 02/28/2016 1318   LABSPEC 1.013 02/28/2016 1318   PHURINE 5.0 02/28/2016 1318   GLUCOSEU NEGATIVE 02/28/2016 1318   HGBUR NEGATIVE 02/28/2016 1318   BILIRUBINUR NEGATIVE 02/28/2016 1318   KETONESUR NEGATIVE 02/28/2016 1318   PROTEINUR 100 (A) 02/28/2016 1318   NITRITE NEGATIVE 02/28/2016 1318   LEUKOCYTESUR TRACE (A) 02/28/2016 1318    ----------------------------------------------------------------------------------------------------------------   Imaging Results:    Dg Chest 2 View  Result Date: 07/27/2016 CLINICAL DATA:  Shortness of breath EXAM: CHEST  2 VIEW COMPARISON:  07/23/2016 FINDINGS: Low volume film. The cardio pericardial silhouette is enlarged. Bibasilar atelectasis or scarring again noted. Patient is status post median sternotomy. Bones are diffusely demineralized. Telemetry leads overlie the  chest. IMPRESSION: Low volume with chronic interstitial coarsening in basilar atelectasis or scarring. Electronically Signed   By: Misty Stanley M.D.   On: 07/27/2016 15:27  My personal review of EKG: Rhythm Left bundle branch block which is old   Assessment & Plan:      1. Acute on chronic bronchitis with chronic hypoxic respiratory failure due to bronchitis and CHF, underlying COPD. Get sputum Gram stain culture, continue oral Levaquin, scheduled and as needed nebulizer treatment, continue oxygen supplementation, flutter valve for pulmonate toiletry and monitor.  2. Acute on chronic diastolic CHF last EF 21% on recent echocardiogram. IV Lasix 1, continue home dose beta blocker, monitor intake and output, salt and fluid restriction, oxygen supplementation and repeat BMP tomorrow.  3. CAD status post CABG - left bundle branch block. No acute issues. Continue supportive care, on beta blocker and statin will be continued for secondary prevention.  4. Dyslipidemia. Continue home dose statin.  5. GERD. Placed on PPI.  6. Mild acute on chronic COPD exacerbation. Treatment as in #1 above along with gentle IV steroids and monitor.  7. DM2 - on diet control, check A1c, since he will be on IV steroids will cover with sliding scale until he is here.    DVT Prophylaxis Heparin   AM Labs Ordered, also please review Full Orders  Family Communication: Admission, patients condition and plan of care including tests being ordered have been discussed with the patient and Daughter-in-law who indicate understanding and agree with the plan and Code Status.  Code Status DO NOT RESUSCITATE  Likely DC to  TBD  Condition GUARDED    Consults called: None    Admission status: Observation    Time spent in minutes : 30   Lala Lund M.D on 07/27/2016 at 5:10 PM  Between 7am to 7pm - Pager - [phone] ( page via [facility], text pages only, please mention full 10 digit call back number).  After  7pm go to [email or web address] - password [facility]  [facility] - Office  [phone]

## 2016-07-27 NOTE — Progress Notes (Signed)
Lawrence Warner is a 83 y.o. male patient admitted from ED awake, alert - oriented  X 4 - no acute distress noted.  VSS - Blood pressure (!) 131/50, pulse (!) 59, temperature 98.6 F (37 C), temperature source Oral, resp. rate 15, height 5\' 7"  (1.702 m), weight 92 kg (202 lb 13.2 oz), SpO2 94 %.    IV in place, occlusive dsg intact without redness.  Orientation to room, and floor completed with information packet given to patient/family.  Patient declined safety video at this time.  Admission INP armband ID verified with patient/family, and in place.   SR up x 2, fall assessment complete, with patient and family able to verbalize understanding of risk associated with falls, and verbalized understanding to call nsg before up out of bed.  Call light within reach, patient able to voice, and demonstrate understanding.  Skin, clean-dry- intact without evidence of bruising, or skin tears.   No evidence of skin break down noted on exam.     Will cont to eval and treat per MD orders.  Celine Ahr, RN 07/27/2016 6:09 PM

## 2016-07-27 NOTE — ED Triage Notes (Signed)
Pt BIB EMS from [place]. Per EMS, pt d/c'd from [facility] yesterday after being admitted for a week with pna. Today, pt exhibited incr'd lethargy, weakness with congested breathing and cough.

## 2016-07-27 NOTE — ED Notes (Signed)
Patient transported to X-ray 

## 2016-07-28 DIAGNOSIS — Z7952 Long term (current) use of systemic steroids: Secondary | ICD-10-CM | POA: Diagnosis not present

## 2016-07-28 DIAGNOSIS — J9601 Acute respiratory failure with hypoxia: Secondary | ICD-10-CM | POA: Diagnosis not present

## 2016-07-28 DIAGNOSIS — R52 Pain, unspecified: Secondary | ICD-10-CM | POA: Diagnosis not present

## 2016-07-28 DIAGNOSIS — G8929 Other chronic pain: Secondary | ICD-10-CM | POA: Diagnosis not present

## 2016-07-28 DIAGNOSIS — D519 Vitamin B12 deficiency anemia, unspecified: Secondary | ICD-10-CM | POA: Diagnosis not present

## 2016-07-28 DIAGNOSIS — I130 Hypertensive heart and chronic kidney disease with heart failure and stage 1 through stage 4 chronic kidney disease, or unspecified chronic kidney disease: Secondary | ICD-10-CM | POA: Diagnosis present

## 2016-07-28 DIAGNOSIS — J440 Chronic obstructive pulmonary disease with acute lower respiratory infection: Secondary | ICD-10-CM | POA: Diagnosis present

## 2016-07-28 DIAGNOSIS — K219 Gastro-esophageal reflux disease without esophagitis: Secondary | ICD-10-CM | POA: Diagnosis present

## 2016-07-28 DIAGNOSIS — R0603 Acute respiratory distress: Secondary | ICD-10-CM | POA: Diagnosis not present

## 2016-07-28 DIAGNOSIS — Z96641 Presence of right artificial hip joint: Secondary | ICD-10-CM | POA: Diagnosis present

## 2016-07-28 DIAGNOSIS — Z951 Presence of aortocoronary bypass graft: Secondary | ICD-10-CM | POA: Diagnosis not present

## 2016-07-28 DIAGNOSIS — R262 Difficulty in walking, not elsewhere classified: Secondary | ICD-10-CM | POA: Diagnosis not present

## 2016-07-28 DIAGNOSIS — Z79899 Other long term (current) drug therapy: Secondary | ICD-10-CM | POA: Diagnosis not present

## 2016-07-28 DIAGNOSIS — D693 Immune thrombocytopenic purpura: Secondary | ICD-10-CM | POA: Diagnosis present

## 2016-07-28 DIAGNOSIS — N183 Chronic kidney disease, stage 3 (moderate): Secondary | ICD-10-CM | POA: Diagnosis present

## 2016-07-28 DIAGNOSIS — E1122 Type 2 diabetes mellitus with diabetic chronic kidney disease: Secondary | ICD-10-CM | POA: Diagnosis present

## 2016-07-28 DIAGNOSIS — H04129 Dry eye syndrome of unspecified lacrimal gland: Secondary | ICD-10-CM | POA: Diagnosis not present

## 2016-07-28 DIAGNOSIS — J441 Chronic obstructive pulmonary disease with (acute) exacerbation: Secondary | ICD-10-CM | POA: Diagnosis present

## 2016-07-28 DIAGNOSIS — I10 Essential (primary) hypertension: Secondary | ICD-10-CM | POA: Diagnosis not present

## 2016-07-28 DIAGNOSIS — J209 Acute bronchitis, unspecified: Secondary | ICD-10-CM | POA: Diagnosis present

## 2016-07-28 DIAGNOSIS — J449 Chronic obstructive pulmonary disease, unspecified: Secondary | ICD-10-CM | POA: Diagnosis not present

## 2016-07-28 DIAGNOSIS — Z9981 Dependence on supplemental oxygen: Secondary | ICD-10-CM | POA: Diagnosis not present

## 2016-07-28 DIAGNOSIS — I5033 Acute on chronic diastolic (congestive) heart failure: Secondary | ICD-10-CM | POA: Diagnosis present

## 2016-07-28 DIAGNOSIS — J9621 Acute and chronic respiratory failure with hypoxia: Secondary | ICD-10-CM | POA: Diagnosis present

## 2016-07-28 DIAGNOSIS — Z880 Allergy status to penicillin: Secondary | ICD-10-CM | POA: Diagnosis not present

## 2016-07-28 DIAGNOSIS — M6281 Muscle weakness (generalized): Secondary | ICD-10-CM | POA: Diagnosis not present

## 2016-07-28 DIAGNOSIS — R1312 Dysphagia, oropharyngeal phase: Secondary | ICD-10-CM | POA: Diagnosis not present

## 2016-07-28 DIAGNOSIS — K5900 Constipation, unspecified: Secondary | ICD-10-CM | POA: Diagnosis not present

## 2016-07-28 DIAGNOSIS — E1140 Type 2 diabetes mellitus with diabetic neuropathy, unspecified: Secondary | ICD-10-CM | POA: Diagnosis present

## 2016-07-28 DIAGNOSIS — N400 Enlarged prostate without lower urinary tract symptoms: Secondary | ICD-10-CM | POA: Diagnosis present

## 2016-07-28 DIAGNOSIS — I2510 Atherosclerotic heart disease of native coronary artery without angina pectoris: Secondary | ICD-10-CM | POA: Diagnosis present

## 2016-07-28 DIAGNOSIS — R278 Other lack of coordination: Secondary | ICD-10-CM | POA: Diagnosis not present

## 2016-07-28 DIAGNOSIS — I5032 Chronic diastolic (congestive) heart failure: Secondary | ICD-10-CM | POA: Diagnosis not present

## 2016-07-28 DIAGNOSIS — E785 Hyperlipidemia, unspecified: Secondary | ICD-10-CM | POA: Diagnosis present

## 2016-07-28 DIAGNOSIS — Z87891 Personal history of nicotine dependence: Secondary | ICD-10-CM | POA: Diagnosis not present

## 2016-07-28 DIAGNOSIS — Z66 Do not resuscitate: Secondary | ICD-10-CM | POA: Diagnosis present

## 2016-07-28 DIAGNOSIS — I447 Left bundle-branch block, unspecified: Secondary | ICD-10-CM | POA: Diagnosis present

## 2016-07-28 DIAGNOSIS — E569 Vitamin deficiency, unspecified: Secondary | ICD-10-CM | POA: Diagnosis not present

## 2016-07-28 DIAGNOSIS — N179 Acute kidney failure, unspecified: Secondary | ICD-10-CM | POA: Diagnosis present

## 2016-07-28 DIAGNOSIS — E119 Type 2 diabetes mellitus without complications: Secondary | ICD-10-CM | POA: Diagnosis not present

## 2016-07-28 LAB — BASIC METABOLIC PANEL
Anion gap: 10 (ref 5–15)
BUN: 67 mg/dL — AB (ref 6–20)
CALCIUM: 9.2 mg/dL (ref 8.9–10.3)
CO2: 24 mmol/L (ref 22–32)
CREATININE: 2.02 mg/dL — AB (ref 0.61–1.24)
Chloride: 102 mmol/L (ref 101–111)
GFR, EST AFRICAN AMERICAN: 31 mL/min — AB (ref >60)
GFR, EST NON AFRICAN AMERICAN: 26 mL/min — AB (ref >60)
Glucose, Bld: 209 mg/dL — ABNORMAL HIGH (ref 65–99)
Potassium: 4.4 mmol/L (ref 3.5–5.1)
SODIUM: 136 mmol/L (ref 135–145)

## 2016-07-28 LAB — GLUCOSE, CAPILLARY
GLUCOSE-CAPILLARY: 212 mg/dL — AB (ref 65–99)
GLUCOSE-CAPILLARY: 289 mg/dL — AB (ref 65–99)
GLUCOSE-CAPILLARY: 268 mg/dL — AB (ref 65–99)
Glucose-Capillary: 224 mg/dL — ABNORMAL HIGH (ref 65–99)

## 2016-07-28 MED ORDER — ALBUTEROL SULFATE (2.5 MG/3ML) 0.083% IN NEBU
2.5000 mg | INHALATION_SOLUTION | Freq: Once | RESPIRATORY_TRACT | Status: AC
  Administered 2016-07-28: 2.5 mg via RESPIRATORY_TRACT
  Filled 2016-07-28: qty 3

## 2016-07-28 MED ORDER — ACETYLCYSTEINE 20 % IN SOLN
4.0000 mL | Freq: Once | RESPIRATORY_TRACT | Status: AC
  Administered 2016-07-28: 4 mL via RESPIRATORY_TRACT
  Filled 2016-07-28 (×2): qty 4

## 2016-07-28 MED ORDER — CYCLOSPORINE 0.05 % OP EMUL
1.0000 [drp] | Freq: Four times a day (QID) | OPHTHALMIC | Status: DC
  Administered 2016-07-28 – 2016-07-30 (×7): 1 [drp] via OPHTHALMIC
  Filled 2016-07-28 (×9): qty 1

## 2016-07-28 NOTE — Progress Notes (Signed)
@IPLOG         PROGRESS NOTE                                                                                                                                                                                                             Patient Demographics:    Lawrence Warner, is a 83 y.o. male, DOB - Aug 17, 1918, PYK:[id number]  Admit date - 07/27/2016   Admitting Physician Thurnell Lose, MD  Outpatient Primary MD for the patient is Hoyt Koch, MD  LOS - 0  Chief Complaint  Patient presents with  . Fatigue       Brief Narrative   Lawrence Warner  is a 83 y.o. male, History of COPD on 2 L nasal cannula home oxygen, CAD post CABG in 2000, left bundle branch block, DM type II, essential hypertension, ITP, BPH, chronic kidney disease stage III baseline creatinine close to 1.5, patient was recently admitted for pneumonia, was discharged yesterday to his home after he got much better per patient and his daughter-in-law, patient since last night started getting progressively weak along with mildly worsening productive cough and shortness of breath. Came back to the ER where he was found to be extremely weak and deconditioned, had productive cough suggestive of acute bronchitis, had a few rales on exam suggestive of mild CHF. I was called to admit.   Subjective:    Lawrence Warner today has, No headache, No chest pain, No abdominal pain - No Nausea, No new weakness tingling or numbness, +ve  Cough - No SOB.     Assessment  & Plan :     1. Acute on chronic bronchitis with chronic hypoxic respiratory failure due to bronchitis and CHF, underlying COPD. For now continue Levaquin, IV steroids, pending sputum Gram stain culture, no infiltrate on chest x-ray, continue supplemental oxygen which he now uses at home, and it flutter valve along with one dose of Mucomyst nebulizer treatment for pulmonary toiletry.  2. Acute on chronic diastolic CHF last EF 53% on recent echocardiogram.  Status post IV Lasix in the ER, continue home dose beta blocker, monitor intake and output, salt and fluid restriction. Supportive care as above, since creatinine has bumped will hold further diuresis.   3. CAD status post CABG - left bundle branch block. No acute issues. Continue supportive care along with statin and beta blocker for secondary prevention.  4. Dyslipidemia. Continue home dose statin.  5. GERD. Placed on PPI.  6. Mild acute on chronic COPD exacerbation. Treatment as in #  1 above along with gentle IV steroids and monitor.  7. DM2 - diet controlled at home, since he is exposed to IV steroids covering him with sliding scale.  Lab Results  Component Value Date   HGBA1C 7.6 (H) 07/22/2016     CBG (last 3)   Recent Labs  07/27/16 1356 07/27/16 2220 07/28/16 0802  GLUCAP 206* 283* 224*      Diet : Diet Heart Room service appropriate? Yes; Fluid consistency: Thin; Fluid restriction: 1500 mL Fluid    Family Communication  : Daughter in [place] on the day of admission  Code Status :  DNR  Disposition Plan  :  TBD  Consults  :  None  Procedures  :  None  DVT Prophylaxis  :    Heparin    Lab Results  Component Value Date   PLT 174 07/27/2016    Inpatient Medications  Scheduled Meds: . acetylcysteine  4 mL Nebulization Once  . albuterol  2.5 mg Nebulization Once  . docusate sodium  100 mg Oral BID  . fluticasone  2 spray Each Nare BID  . guaiFENesin  600 mg Oral BID  . heparin  5,000 Units Subcutaneous Q8H  . insulin aspart  0-5 Units Subcutaneous QHS  . insulin aspart  0-9 Units Subcutaneous TID WC  . ipratropium-albuterol  3 mL Nebulization Q6H  . levofloxacin  250 mg Oral Daily  . methylPREDNISolone (SOLU-MEDROL) injection  60 mg Intravenous Q12H  . metoprolol tartrate  6.25 mg Oral BID  . pantoprazole  40 mg Oral Daily  . senna-docusate  1 tablet Oral QHS  . simvastatin  20 mg Oral QHS   Continuous Infusions: PRN Meds:.acetaminophen,  albuterol, hydroxypropyl methylcellulose / hypromellose, nitroGLYCERIN, ondansetron **OR** ondansetron (ZOFRAN) IV  Antibiotics  :    Anti-infectives    Start     Dose/Rate Route Frequency Ordered Stop   07/27/16 1715  levofloxacin (LEVAQUIN) tablet 250 mg     250 mg Oral Daily 07/27/16 1709           Objective:   Vitals:   07/27/16 2225 07/28/16 0440 07/28/16 0803 07/28/16 0858  BP: (!) 148/48 (!) 122/45  139/61  Pulse: 65 71  63  Resp: 18 18    Temp: 97.8 F (36.6 C) 97.8 F (36.6 C)    TempSrc: Oral Oral    SpO2: 96% 95% 97%   Weight:      Height:        Wt Readings from Last 3 Encounters:  07/27/16 92 kg (202 lb 13.2 oz)  07/26/16 92 kg (202 lb 12.8 oz)  05/07/16 100.4 kg (221 lb 6.4 oz)     Intake/Output Summary (Last 24 hours) at 07/28/16 1001 Last data filed at 07/28/16 8938  Gross per 24 hour  Intake              650 ml  Output              575 ml  Net               75 ml     Physical Exam  Awake Alert, Oriented X 3, No new F.N deficits, Normal affect [place].AT,PERRAL Supple Neck,No JVD, No cervical lymphadenopathy appriciated.  Symmetrical Chest wall movement, Good air movement bilaterally, ++ Coarse breath sounds RRR,No Gallops,Rubs or new Murmurs, No Parasternal Heave +ve B.Sounds, Abd Soft, No tenderness, No organomegaly appriciated, No rebound - guarding or rigidity. No Cyanosis, Clubbing or edema, No new  Rash or bruise       Data Review:    CBC  Recent Labs Lab 07/23/16 0613 07/24/16 0444 07/25/16 0332 07/26/16 0635 07/27/16 1350  WBC 16.0* 12.1* 11.6* 11.8* 11.8*  HGB 13.6 12.9* 12.4* 13.5 13.0  HCT 43.1 41.1 39.2 41.6 40.0  PLT 189 171 160 190 174  MCV 96.9 97.4 96.3 96.1 93.9  MCH 30.6 30.6 30.5 31.2 30.5  MCHC 31.6 31.4 31.6 32.5 32.5  RDW 13.3 13.4 12.9 12.6 12.5    Chemistries   Recent Labs Lab 07/24/16 0444 07/25/16 0332 07/26/16 0635 07/27/16 1350 07/28/16 0620  NA 137 136 137 134* 136  K 4.4 4.6 4.1 4.9 4.4  CL  102 100* 100* 103 102  CO2 26 27 27 23 24   GLUCOSE 161* 141* 122* 204* 209*  BUN 62* 63* 60* 53* 67*  CREATININE 2.02* 1.97* 1.95* 1.69* 2.02*  CALCIUM 9.4 9.1 9.5 9.1 9.2   ------------------------------------------------------------------------------------------------------------------ No results for input(s): CHOL, HDL, LDLCALC, TRIG, CHOLHDL, LDLDIRECT in the last 72 hours.  Lab Results  Component Value Date   HGBA1C 7.6 (H) 07/22/2016   ------------------------------------------------------------------------------------------------------------------ No results for input(s): TSH, T4TOTAL, T3FREE, THYROIDAB in the last 72 hours.  Invalid input(s): FREET3 ------------------------------------------------------------------------------------------------------------------ No results for input(s): VITAMINB12, FOLATE, FERRITIN, TIBC, IRON, RETICCTPCT in the last 72 hours.  Coagulation profile No results for input(s): INR, PROTIME in the last 168 hours.  No results for input(s): DDIMER in the last 72 hours.  Cardiac Enzymes No results for input(s): CKMB, TROPONINI, MYOGLOBIN in the last 168 hours.  Invalid input(s): CK ------------------------------------------------------------------------------------------------------------------    Component Value Date/Time   BNP 173.6 (H) 07/27/2016 1350    Micro Results Recent Results (from the past 240 hour(s))  Blood culture (routine x 2)     Status: None   Collection Time: 07/20/16  3:45 PM  Result Value Ref Range Status   Specimen Description BLOOD RIGHT ANTECUBITAL  Final   Special Requests   Final    BOTTLES DRAWN AEROBIC AND ANAEROBIC Blood Culture results may not be optimal due to an excessive volume of blood received in culture bottles   Culture NO GROWTH 5 DAYS  Final   Report Status 07/25/2016 FINAL  Final  Blood culture (routine x 2)     Status: None   Collection Time: 07/20/16  3:58 PM  Result Value Ref Range Status    Specimen Description BLOOD RIGHT HAND  Final   Special Requests   Final    BOTTLES DRAWN AEROBIC ONLY Blood Culture adequate volume   Culture NO GROWTH 5 DAYS  Final   Report Status 07/25/2016 FINAL  Final  Respiratory Panel by PCR     Status: Abnormal   Collection Time: 07/24/16  5:11 PM  Result Value Ref Range Status   Adenovirus NOT DETECTED NOT DETECTED Final   Coronavirus 229E NOT DETECTED NOT DETECTED Final   Coronavirus HKU1 NOT DETECTED NOT DETECTED Final   Coronavirus NL63 NOT DETECTED NOT DETECTED Final   Coronavirus OC43 NOT DETECTED NOT DETECTED Final   Metapneumovirus DETECTED (A) NOT DETECTED Final   Rhinovirus / Enterovirus NOT DETECTED NOT DETECTED Final   Influenza A NOT DETECTED NOT DETECTED Final   Influenza B NOT DETECTED NOT DETECTED Final   Parainfluenza Virus 1 NOT DETECTED NOT DETECTED Final   Parainfluenza Virus 2 NOT DETECTED NOT DETECTED Final   Parainfluenza Virus 3 NOT DETECTED NOT DETECTED Final   Parainfluenza Virus 4 NOT DETECTED NOT DETECTED Final  Respiratory Syncytial Virus NOT DETECTED NOT DETECTED Final   Bordetella pertussis NOT DETECTED NOT DETECTED Final   Chlamydophila pneumoniae NOT DETECTED NOT DETECTED Final   Mycoplasma pneumoniae NOT DETECTED NOT DETECTED Final    Radiology Reports Dg Chest 2 View  Result Date: 07/27/2016 CLINICAL DATA:  Shortness of breath EXAM: CHEST  2 VIEW COMPARISON:  07/23/2016 FINDINGS: Low volume film. The cardio pericardial silhouette is enlarged. Bibasilar atelectasis or scarring again noted. Patient is status post median sternotomy. Bones are diffusely demineralized. Telemetry leads overlie the chest. IMPRESSION: Low volume with chronic interstitial coarsening in basilar atelectasis or scarring. Electronically Signed   By: Misty Stanley M.D.   On: 07/27/2016 15:27   Ct Head Wo Contrast  Result Date: 07/20/2016 CLINICAL DATA:  Headache. EXAM: CT HEAD WITHOUT CONTRAST TECHNIQUE: Contiguous axial images were  obtained from the base of the skull through the vertex without intravenous contrast. COMPARISON:  None. FINDINGS: Brain: Mild diffuse cortical atrophy is noted. No mass effect or midline shift is noted. Ventricular size is within normal limits. There is no evidence of mass lesion, hemorrhage or acute infarction. Vascular: No hyperdense vessel or unexpected calcification. Skull: Normal. Negative for fracture or focal lesion. Sinuses/Orbits: No acute finding. Other: None. IMPRESSION: Mild diffuse cortical atrophy. No acute intracranial abnormality seen. Electronically Signed   By: Marijo Conception, M.D.   On: 07/20/2016 14:21   Ct Chest Wo Contrast  Result Date: 07/22/2016 CLINICAL DATA:  Progressive productive cough with shortness of breath and wheezing. EXAM: CT CHEST WITHOUT CONTRAST TECHNIQUE: Multidetector CT imaging of the chest was performed following the standard protocol without IV contrast. COMPARISON:  Radiographs 07/20/2016 and 02/28/2016. FINDINGS: Cardiovascular: There is atherosclerosis of the aorta, great vessels and coronary arteries status post median sternotomy and CABG. No acute vascular findings are seen on noncontrast imaging. The heart size is normal. There is no pericardial effusion. Mediastinum/Nodes: There are no enlarged mediastinal, hilar or axillary lymph nodes.Small calcified right hilar nodes are noted. The thyroid gland, trachea and esophagus demonstrate no significant findings. Lungs/Pleura: There is no pleural effusion. Mild diffuse central airway thickening with patchy peribronchial ground-glass densities, greatest in the right lower lobe. There is no consolidation or suspicious pulmonary nodule. Upper abdomen: Hepatic low density consistent with steatosis. There are scattered small calcified granulomas in the liver and spleen. Probable small cyst posteriorly in the mid left kidney. Musculoskeletal/Chest wall: No acute osseous findings are seen. There is diffuse ankylosis of the  cervicothoracic spine consistent with idiopathic skeletal hyperostosis. There is a large posterior disc osteophyte complex at C2-3, asymmetric to the left. This may contribute to mass effect on the cord at this level. IMPRESSION: 1. Central airway thickening with patchy peribronchial opacities in both lungs, likely early bronchopneumonia or atypical infection. No consolidation. 2. Diffuse atherosclerosis post CABG. 3. Diffuse idiopathic skeletal hyperostosis with large posterior disc osteophyte complex at C2-3 causing canal compromise. Electronically Signed   By: Richardean Sale M.D.   On: 07/22/2016 10:35   Dg Chest Port 1 View  Result Date: 07/23/2016 CLINICAL DATA:  83 year old male with shortness of breath. EXAM: PORTABLE CHEST 1 VIEW COMPARISON:  Chest CT dated 07/22/2016 FINDINGS: Single portable view of the chest demonstrates minimal bibasilar densities, likely atelectatic changes. There is no focal consolidation, pleural effusion, or pneumothorax. Top-normal cardiac size. Median sternotomy wires and CABG vascular clips. No acute osseous pathology. IMPRESSION: No focal consolidation. Electronically Signed   By: Anner Crete M.D.   On: 07/23/2016 03:42  Dg Chest Port 1 View  Result Date: 07/20/2016 CLINICAL DATA:  83 year old male with shortness of breath and productive cough with yellow mucus for the past 2 days. EXAM: PORTABLE CHEST 1 VIEW COMPARISON:  Chest x-ray 02/28/2016. FINDINGS: Lung volumes are low. No consolidative airspace disease. No pleural effusions. No pneumothorax. No pulmonary nodule or mass noted. Pulmonary vasculature and the cardiomediastinal silhouette are within normal limits. Aortic atherosclerosis. Status post median sternotomy for CABG including LIMA. IMPRESSION: 1. Low lung volumes without radiographic evidence of acute cardiopulmonary disease. 2. Aortic atherosclerosis. Electronically Signed   By: Vinnie Langton M.D.   On: 07/20/2016 13:36    Time Spent in minutes   30   Lala Lund M.D on 07/28/2016 at 10:01 AM  Between 7am to 7pm - Pager - [phone] ( page via [email or web address], text pages only, please mention full 10 digit call back number). After 7pm go to [email or web address] - password [email or web address]

## 2016-07-28 NOTE — Evaluation (Signed)
Physical Therapy Evaluation Patient Details Name: Lawrence Warner MRN: [id number] DOB: November 23, 1918 Today's Date: 07/28/2016   History of Present Illness  Pt is a 83 y.o. male with history of COPD on 2 L nasal cannula home oxygen, CAD post CABG in 2000, left bundle branch block, DM type II, essential hypertension, ITP, BPH, and chronic kidney disease stage III. He was recently admitted for pneumonia and discharged home 07-26-16.  He came back to the ER 07-27-16 where he was found to be extremely weak and deconditioned, had productive cough suggestive of acute bronchitis, and had a few rales on exam suggestive of mild CHF.  Clinical Impression  Pt admitted with above diagnosis. Pt currently with functional limitations due to the deficits listed below (see PT Problem List). On eval, pt required min assist bed mobility, min assist transfers, and min guard assist ambulation with RW 25 feet. Gait distance limited by fatigue. Increased WOB noted with short distance. Pt will benefit from skilled PT to increase their independence and safety with mobility to allow discharge to the venue listed below.       Follow Up Recommendations SNF    Equipment Recommendations  None recommended by PT    Recommendations for Other Services       Precautions / Restrictions Precautions Precautions: Fall;Other (comment) Precaution Comments: Monitor O2      Mobility  Bed Mobility Overal bed mobility: Needs Assistance Bed Mobility: Supine to Sit     Supine to sit: Min assist     General bed mobility comments: +rail, increased time  Transfers Overall transfer level: Needs assistance Equipment used: Rolling walker (2 wheeled) Transfers: Sit to/from Stand Sit to Stand: Min assist         General transfer comment: assist to power up, increased time to stabilize initial standing balance  Ambulation/Gait Ambulation/Gait assistance: Min guard Ambulation Distance (Feet): 25 Feet Assistive device: Rolling  walker (2 wheeled) Gait Pattern/deviations: Step-through pattern;Decreased stride length;Trunk flexed Gait velocity: decreased Gait velocity interpretation: <1.8 ft/sec, indicative of risk for recurrent falls General Gait Details: SpO2 96% on 2 L O2 at rest. Ambulated on RA with desat to 89%. Replaced O2 via Ricardo in recliner with recovery to 93% after 2 minutes.  Stairs            Wheelchair Mobility    Modified Rankin (Stroke Patients Only)       Balance   Sitting-balance support: No upper extremity supported;Feet supported Sitting balance-Leahy Scale: Good     Standing balance support: Bilateral upper extremity supported;During functional activity Standing balance-Leahy Scale: Fair Standing balance comment: RW needed for stability                              Pertinent Vitals/Pain Pain Assessment: No/denies pain    Home Living Family/patient expects to be discharged to:: Private residence Living Arrangements: Alone Available Help at Discharge: Available PRN/intermittently;Family Type of Home: Independent living facility [facility]) Home Access: Level entry;Elevator     Home Layout: One level Home Equipment: Environmental consultant - 4 wheels;Cane - single point;Grab bars - toilet;Electric scooter;Shower seat - built in Additional Comments: states that he gets his meals provided but no other assistance    Prior Function Level of Independence: Independent with assistive device(s)         Comments: using SPC for ambulation     Hand Dominance        Extremity/Trunk Assessment  Communication   Communication: No difficulties  Cognition Arousal/Alertness: Awake/alert Behavior During Therapy: WFL for tasks assessed/performed Overall Cognitive Status: Within Functional Limits for tasks assessed                                        General Comments      Exercises     Assessment/Plan    PT Assessment Patient  needs continued PT services  PT Problem List Decreased strength;Decreased activity tolerance;Decreased balance;Decreased mobility;Decreased range of motion;Cardiopulmonary status limiting activity       PT Treatment Interventions DME instruction;Gait training;Functional mobility training;Therapeutic activities;Therapeutic exercise;Balance training;Neuromuscular re-education;Patient/family education    PT Goals (Current goals can be found in the Care Plan section)  Acute Rehab PT Goals Patient Stated Goal: feel better PT Goal Formulation: With patient Time For Goal Achievement: 08/11/16 Potential to Achieve Goals: Fair    Frequency Min 3X/week   Barriers to discharge        Co-evaluation               AM-PAC PT "6 Clicks" Daily Activity  Outcome Measure Difficulty turning over in bed (including adjusting bedclothes, sheets and blankets)?: None Difficulty moving from lying on back to sitting on the side of the bed? : Total Difficulty sitting down on and standing up from a chair with arms (e.g., wheelchair, bedside commode, etc,.)?: Total Help needed moving to and from a bed to chair (including a wheelchair)?: A Little Help needed walking in hospital room?: A Little Help needed climbing 3-5 steps with a railing? : A Lot 6 Click Score: 14    End of Session Equipment Utilized During Treatment: Oxygen;Gait belt Activity Tolerance: Patient limited by fatigue Patient left: in chair;with chair alarm set;with call bell/phone within reach Nurse Communication: Mobility status PT Visit Diagnosis: Muscle weakness (generalized) (M62.81);Other abnormalities of gait and mobility (R26.89)    Time: 3295-1884 PT Time Calculation (min) (ACUTE ONLY): 15 min   Charges:   PT Evaluation $PT Eval Moderate Complexity: 1 Procedure     PT G Codes:   PT G-Codes **NOT FOR INPATIENT CLASS** Functional Assessment Tool Used: AM-PAC 6 Clicks Basic Mobility Functional Limitation: Mobility:  Walking and moving around Mobility: Walking and Moving Around Current Status (Z6606): At least 40 percent but less than 60 percent impaired, limited or restricted Mobility: Walking and Moving Around Goal Status [phone]): At least 1 percent but less than 20 percent impaired, limited or restricted    Lorrin Goodell, PT  Office # [phone] Pager [phone]   Lorriane Shire 07/28/2016, 10:46 AM

## 2016-07-29 LAB — BASIC METABOLIC PANEL
Anion gap: 9 (ref 5–15)
BUN: 85 mg/dL — AB (ref 6–20)
CHLORIDE: 104 mmol/L (ref 101–111)
CO2: 22 mmol/L (ref 22–32)
CREATININE: 2.2 mg/dL — AB (ref 0.61–1.24)
Calcium: 9.2 mg/dL (ref 8.9–10.3)
GFR calc Af Amer: 28 mL/min — ABNORMAL LOW (ref >60)
GFR calc non Af Amer: 24 mL/min — ABNORMAL LOW (ref >60)
Glucose, Bld: 229 mg/dL — ABNORMAL HIGH (ref 65–99)
POTASSIUM: 4.4 mmol/L (ref 3.5–5.1)
Sodium: 135 mmol/L (ref 135–145)

## 2016-07-29 LAB — GLUCOSE, CAPILLARY
GLUCOSE-CAPILLARY: 214 mg/dL — AB (ref 65–99)
GLUCOSE-CAPILLARY: 240 mg/dL — AB (ref 65–99)
Glucose-Capillary: 225 mg/dL — ABNORMAL HIGH (ref 65–99)
Glucose-Capillary: 293 mg/dL — ABNORMAL HIGH (ref 65–99)

## 2016-07-29 LAB — EXPECTORATED SPUTUM ASSESSMENT W REFEX TO RESP CULTURE

## 2016-07-29 LAB — EXPECTORATED SPUTUM ASSESSMENT W GRAM STAIN, RFLX TO RESP C

## 2016-07-29 LAB — HEMOGLOBIN A1C
HEMOGLOBIN A1C: 8.3 % — AB (ref 4.8–5.6)
Mean Plasma Glucose: 192 mg/dL

## 2016-07-29 MED ORDER — METHYLPREDNISOLONE SODIUM SUCC 125 MG IJ SOLR
60.0000 mg | Freq: Every day | INTRAMUSCULAR | Status: DC
Start: 2016-07-30 — End: 2016-07-30

## 2016-07-29 MED ORDER — SODIUM CHLORIDE 0.9 % IV SOLN
INTRAVENOUS | Status: AC
  Administered 2016-07-29: 12:00:00 via INTRAVENOUS

## 2016-07-29 MED ORDER — IPRATROPIUM-ALBUTEROL 0.5-2.5 (3) MG/3ML IN SOLN
3.0000 mL | Freq: Three times a day (TID) | RESPIRATORY_TRACT | Status: DC
  Administered 2016-07-30: 3 mL via RESPIRATORY_TRACT
  Filled 2016-07-29: qty 3

## 2016-07-29 NOTE — [place] FL2 (Signed)
[place] MEDICAID FL2 LEVEL OF CARE SCREENING TOOL     IDENTIFICATION  Patient Name: Lawrence Warner Birthdate: October 12, 1918 Sex: male Admission Date (Current Location): 07/27/2016  [facility] and [place] Number:  Herbalist and Address:  [facility] [place] [place] [address] [place], [place] [address]      Provider Number: [id number]  Attending Physician Name and Address:  Thurnell Lose, MD  Relative Name and Phone Number:       Current Level of Care: Hospital Recommended Level of Care: [place] Prior Approval Number:    Date Approved/Denied:   PASRR Number:   [id number]   Discharge Plan: SNF    Current Diagnoses: Patient Active Problem List   Diagnosis Date Noted  . Hypoxia   . Acute respiratory failure ([place]) 07/20/2016  . Respiratory failure ([place]) 02/28/2016  . Acute respiratory failure with hypoxemia ([place]) 02/28/2016  . HLD (hyperlipidemia) 02/28/2016  . AKI (acute kidney injury) ([place])   . Influenza   . Sepsis ([place])   . Constipation 12/07/2015  . Bradycardia 11/28/2015  . Port catheter in place 10/24/2015  . GERD (gastroesophageal reflux disease) 10/09/2015  . Gait instability 10/09/2015  . Chest pain 10/05/2015  . Diet-controlled diabetes mellitus ([place]) 05/07/2015  . CAD (coronary artery disease) 05/07/2015  . Essential hypertension 05/07/2015  . Muscle cramps 04/25/2015  . Shortness of breath 04/11/2015  . Bronchitis 03/14/2015  . Idiopathic thrombocytopenic purpura ([place]) 09/27/2014    Orientation RESPIRATION BLADDER Height & Weight     Self, Time, Situation, Place  O2 (2L [place]) Continent Weight: 200 lb 3.2 oz (90.8 kg) Height:  5\' 7"  (170.2 cm)  BEHAVIORAL SYMPTOMS/MOOD NEUROLOGICAL BOWEL NUTRITION STATUS      Continent Diet (see DC summary)  AMBULATORY STATUS COMMUNICATION OF NEEDS Skin   Limited Assist Verbally Normal                       Personal Care Assistance Level of Assistance  Bathing,  Dressing Bathing Assistance: Limited assistance   Dressing Assistance: Limited assistance     Functional Limitations Info             SPECIAL CARE FACTORS FREQUENCY  PT (By licensed PT), OT (By licensed OT)     PT Frequency: 5/wk OT Frequency: 5/wk            Contractures      Additional Factors Info  Code Status, Allergies, Insulin Sliding Scale Code Status Info: DNR Allergies Info: Alfuzosin Hcl Er, Penicillins   Insulin Sliding Scale Info: 4/day       Current Medications (07/29/2016):  This is the current hospital active medication list Current Facility-Administered Medications  Medication Dose Route Frequency Provider Last Rate Last Dose  . acetaminophen (TYLENOL) tablet 250-1,000 mg  250-1,000 mg Oral Q6H PRN Thurnell Lose, MD      . albuterol (PROVENTIL) (2.5 MG/3ML) 0.083% nebulizer solution 2.5 mg  2.5 mg Nebulization Q4H PRN Thurnell Lose, MD      . cycloSPORINE (RESTASIS) 0.05 % ophthalmic emulsion 1 drop  1 drop Both Eyes QID Thurnell Lose, MD   1 drop at 07/29/16 0602  . docusate sodium (COLACE) capsule 100 mg  100 mg Oral BID Thurnell Lose, MD   100 mg at 07/28/16 2209  . fluticasone (FLONASE) 50 MCG/ACT nasal spray 2 spray  2 spray Each Nare BID Thurnell Lose, MD      . guaiFENesin (  MUCINEX) 12 hr tablet 600 mg  600 mg Oral BID Thurnell Lose, MD   600 mg at 07/29/16 0811  . heparin injection 5,000 Units  5,000 Units Subcutaneous Q8H Thurnell Lose, MD   5,000 Units at 07/29/16 0601  . hydroxypropyl methylcellulose / hypromellose (ISOPTO TEARS / GONIOVISC) 2.5 % ophthalmic solution 1 drop  1 drop Both Eyes TID PRN Thurnell Lose, MD   1 drop at 07/28/16 2158  . insulin aspart (novoLOG) injection 0-5 Units  0-5 Units Subcutaneous QHS Thurnell Lose, MD   3 Units at 07/28/16 2213  . insulin aspart (novoLOG) injection 0-9 Units  0-9 Units Subcutaneous TID WC Thurnell Lose, MD   3 Units at 07/29/16 [phone]  . ipratropium-albuterol  (DUONEB) 0.5-2.5 (3) MG/3ML nebulizer solution 3 mL  3 mL Nebulization Q6H Thurnell Lose, MD   3 mL at 07/29/16 0823  . levofloxacin (LEVAQUIN) tablet 250 mg  250 mg Oral Daily Thurnell Lose, MD   250 mg at 07/29/16 1007  . methylPREDNISolone sodium succinate (SOLU-MEDROL) 125 mg/2 mL injection 60 mg  60 mg Intravenous Q12H Thurnell Lose, MD   60 mg at 07/29/16 0601  . metoprolol tartrate (LOPRESSOR) 25 mg/10 mL oral suspension 6.25 mg  6.25 mg Oral BID Lala Lund K, MD   6.25 mg at 07/28/16 2210  . nitroGLYCERIN (NITROSTAT) SL tablet 0.4 mg  0.4 mg Sublingual Q5 min PRN Thurnell Lose, MD      . ondansetron [facility]) tablet 4 mg  4 mg Oral Q6H PRN Thurnell Lose, MD       Or  . ondansetron (ZOFRAN) injection 4 mg  4 mg Intravenous Q6H PRN Thurnell Lose, MD      . pantoprazole (PROTONIX) EC tablet 40 mg  40 mg Oral Daily Thurnell Lose, MD   40 mg at 07/29/16 0807  . senna-docusate (Senokot-S) tablet 1 tablet  1 tablet Oral QHS Thurnell Lose, MD   1 tablet at 07/28/16 2207  . simvastatin (ZOCOR) tablet 20 mg  20 mg Oral QHS Thurnell Lose, MD   20 mg at 07/28/16 2208   Facility-Administered Medications Ordered in Other Encounters  Medication Dose Route Frequency Provider Last Rate Last Dose  . cyanocobalamin ((VITAMIN B-12)) injection 1,000 mcg  1,000 mcg Subcutaneous Q30 days [name], MD   1,000 mcg at 03/13/16 1348  . cyanocobalamin ((VITAMIN B-12)) injection 1,000 mcg  1,000 mcg Subcutaneous Q30 days [name], MD   1,000 mcg at 05/14/16 1001  . romiPLOStim (NPLATE) injection 50 mcg  0.5 mcg/kg Subcutaneous Weekly [name], MD   50 mcg at 03/13/16 1349  . romiPLOStim (NPLATE) injection 50 mcg  50 mcg Subcutaneous Weekly [name], MD   50 mcg at 05/07/16 1106  . romiPLOStim (NPLATE) injection 50 mcg  0.5 mcg/kg Subcutaneous Weekly [name], MD   50 mcg at 05/14/16 1001     Discharge  Medications: Please see discharge summary for a list of discharge medications.  Relevant Imaging Results:  Relevant Lab Results:   Additional Information SS#: [id number]  Jorge Ny, LCSW

## 2016-07-29 NOTE — Progress Notes (Signed)
Chaplain presented to the patient, to provide spiritual care support, and provide  Advance Directive documentation. The patient states at this time he does not want to complete it, but request the AD be left for his two sons to review at a later time. [place] [phone]

## 2016-07-29 NOTE — Progress Notes (Signed)
@IPLOG         PROGRESS NOTE                                                                                                                                                                                                             Patient Demographics:    Lawrence Warner, is a 83 y.o. male, DOB - [birth year], UUV:[id number]  Admit date - 07/27/2016   Admitting Physician Thurnell Lose, MD  Outpatient Primary MD for the patient is Hoyt Koch, MD  LOS - 1  Chief Complaint  Patient presents with  . Fatigue       Brief Narrative   Lawrence Warner  is a 83 y.o. male, History of COPD on 2 L nasal cannula home oxygen, CAD post CABG in 2000, left bundle branch block, DM type II, essential hypertension, ITP, BPH, chronic kidney disease stage III baseline creatinine close to 1.5, patient was recently admitted for pneumonia, was discharged yesterday to his home after he got much better per patient and his daughter-in-law, patient since last night started getting progressively weak along with mildly worsening productive cough and shortness of breath. Came back to the ER where he was found to be extremely weak and deconditioned, had productive cough suggestive of acute bronchitis, had a few rales on exam suggestive of mild CHF. I was called to admit.   Subjective:    Lawrence Warner today has, No headache, No chest pain, No abdominal pain - No Nausea, No new weakness tingling or numbness, +ve  Cough - No SOB.     Assessment  & Plan :     1. Acute on chronic bronchitis with chronic hypoxic respiratory failure due to bronchitis and CHF, underlying COPD. Still has used bilateral coarse breath sounds, continue pulmonary toiletry, wheezing has minimized hence will reduce IV steroids, continue Levaquin and flutter valve which he is using every hour while awake, encouraged to sit up in chair as much as possible, continue breathing treatments and oxygen as needed. Increase activity today. If  stable likely DC to SNF tomorrow.  2. Acute on chronic diastolic CHF last EF 47% on recent echocardiogram. Was initially diuresed now creatinine is up hence Lasix has been stopped, continue home dose beta blocker, continue salt and fluid restriction. Clinically compensated at this time.   3. CAD status post CABG - left bundle branch block. No acute issues. Continue supportive care along with statin and beta blocker for secondary prevention.  4. Dyslipidemia. Continue home dose statin.  5. GERD. Placed  on PPI.  6.  ARF. Has underlying CK D stage III with baseline creatinine close to 1.7. Gently hydrate today and repeat BMP in the morning.   8. Mild acute on chronic COPD exacerbation. Exacerbation seems to have resolved, Treatment as in #1 above along with gentle IV steroids and monitor.  9. DM2 - diet controlled at home, since he is exposed to IV steroids covering him with sliding scale.  Lab Results  Component Value Date   HGBA1C 7.6 (H) 07/22/2016   CBG (last 3)   Recent Labs  07/28/16 1650 07/28/16 2156 07/29/16 0751  GLUCAP 289* 268* 225*      Diet : Diet Heart Room service appropriate? Yes; Fluid consistency: Thin; Fluid restriction: 1500 mL Fluid    Family Communication  : Daughter in [place] on the day of admission  Code Status :  DNR  Disposition Plan  :  SNF in am if stable  Consults  :  None  Procedures  :  None  DVT Prophylaxis  :    Heparin    Lab Results  Component Value Date   PLT 174 07/27/2016    Inpatient Medications  Scheduled Meds: . cycloSPORINE  1 drop Both Eyes QID  . docusate sodium  100 mg Oral BID  . fluticasone  2 spray Each Nare BID  . guaiFENesin  600 mg Oral BID  . heparin  5,000 Units Subcutaneous Q8H  . insulin aspart  0-5 Units Subcutaneous QHS  . insulin aspart  0-9 Units Subcutaneous TID WC  . ipratropium-albuterol  3 mL Nebulization Q6H  . levofloxacin  250 mg Oral Daily  . methylPREDNISolone (SOLU-MEDROL) injection  60  mg Intravenous Q12H  . metoprolol tartrate  6.25 mg Oral BID  . pantoprazole  40 mg Oral Daily  . senna-docusate  1 tablet Oral QHS  . simvastatin  20 mg Oral QHS   Continuous Infusions: PRN Meds:.acetaminophen, albuterol, hydroxypropyl methylcellulose / hypromellose, nitroGLYCERIN, ondansetron **OR** ondansetron (ZOFRAN) IV  Antibiotics  :    Anti-infectives    Start     Dose/Rate Route Frequency Ordered Stop   07/27/16 1715  levofloxacin (LEVAQUIN) tablet 250 mg     250 mg Oral Daily 07/27/16 1709           Objective:   Vitals:   07/28/16 2201 07/29/16 0609 07/29/16 0614 07/29/16 0810  BP: (!) 128/46 (!) 134/53  (!) 148/58  Pulse: (!) 58 (!) 56  (!) 53  Resp: 18 18    Temp: 97.6 F (36.4 C) 97.7 F (36.5 C)    TempSrc: Oral Oral    SpO2: 95% 97%  97%  Weight:   90.8 kg (200 lb 3.2 oz)   Height:        Wt Readings from Last 3 Encounters:  07/29/16 90.8 kg (200 lb 3.2 oz)  07/26/16 92 kg (202 lb 12.8 oz)  05/07/16 100.4 kg (221 lb 6.4 oz)     Intake/Output Summary (Last 24 hours) at 07/29/16 0957 Last data filed at 07/29/16 0945  Gross per 24 hour  Intake              240 ml  Output              675 ml  Net             -435 ml     Physical Exam  Awake Alert,   No new F.N deficits, Normal affect [place].AT,PERRAL Supple Neck,No JVD, No cervical  lymphadenopathy appriciated.  Symmetrical Chest wall movement, Good air movement bilaterally, coarse bilat breath sounds RRR,No Gallops,Rubs or new Murmurs, No Parasternal Heave +ve B.Sounds, Abd Soft, No tenderness, No organomegaly appriciated, No rebound - guarding or rigidity. No Cyanosis, Clubbing or edema, No new Rash or bruise     Data Review:    CBC  Recent Labs Lab 07/23/16 0613 07/24/16 0444 07/25/16 0332 07/26/16 0635 07/27/16 1350  WBC 16.0* 12.1* 11.6* 11.8* 11.8*  HGB 13.6 12.9* 12.4* 13.5 13.0  HCT 43.1 41.1 39.2 41.6 40.0  PLT 189 171 160 190 174  MCV 96.9 97.4 96.3 96.1 93.9  MCH 30.6  30.6 30.5 31.2 30.5  MCHC 31.6 31.4 31.6 32.5 32.5  RDW 13.3 13.4 12.9 12.6 12.5    Chemistries   Recent Labs Lab 07/25/16 0332 07/26/16 0635 07/27/16 1350 07/28/16 0620 07/29/16 0640  NA 136 137 134* 136 135  K 4.6 4.1 4.9 4.4 4.4  CL 100* 100* 103 102 104  CO2 27 27 23 24 22   GLUCOSE 141* 122* 204* 209* 229*  BUN 63* 60* 53* 67* 85*  CREATININE 1.97* 1.95* 1.69* 2.02* 2.20*  CALCIUM 9.1 9.5 9.1 9.2 9.2   ------------------------------------------------------------------------------------------------------------------ No results for input(s): CHOL, HDL, LDLCALC, TRIG, CHOLHDL, LDLDIRECT in the last 72 hours.  Lab Results  Component Value Date   HGBA1C 7.6 (H) 07/22/2016   ------------------------------------------------------------------------------------------------------------------ No results for input(s): TSH, T4TOTAL, T3FREE, THYROIDAB in the last 72 hours.  Invalid input(s): FREET3 ------------------------------------------------------------------------------------------------------------------ No results for input(s): VITAMINB12, FOLATE, FERRITIN, TIBC, IRON, RETICCTPCT in the last 72 hours.  Coagulation profile No results for input(s): INR, PROTIME in the last 168 hours.  No results for input(s): DDIMER in the last 72 hours.  Cardiac Enzymes No results for input(s): CKMB, TROPONINI, MYOGLOBIN in the last 168 hours.  Invalid input(s): CK ------------------------------------------------------------------------------------------------------------------    Component Value Date/Time   BNP 173.6 (H) 07/27/2016 1350    Micro Results Recent Results (from the past 240 hour(s))  Blood culture (routine x 2)     Status: None   Collection Time: 07/20/16  3:45 PM  Result Value Ref Range Status   Specimen Description BLOOD RIGHT ANTECUBITAL  Final   Special Requests   Final    BOTTLES DRAWN AEROBIC AND ANAEROBIC Blood Culture results may not be optimal due to an  excessive volume of blood received in culture bottles   Culture NO GROWTH 5 DAYS  Final   Report Status 07/25/2016 FINAL  Final  Blood culture (routine x 2)     Status: None   Collection Time: 07/20/16  3:58 PM  Result Value Ref Range Status   Specimen Description BLOOD RIGHT HAND  Final   Special Requests   Final    BOTTLES DRAWN AEROBIC ONLY Blood Culture adequate volume   Culture NO GROWTH 5 DAYS  Final   Report Status 07/25/2016 FINAL  Final  Respiratory Panel by PCR     Status: Abnormal   Collection Time: 07/24/16  5:11 PM  Result Value Ref Range Status   Adenovirus NOT DETECTED NOT DETECTED Final   Coronavirus 229E NOT DETECTED NOT DETECTED Final   Coronavirus HKU1 NOT DETECTED NOT DETECTED Final   Coronavirus NL63 NOT DETECTED NOT DETECTED Final   Coronavirus OC43 NOT DETECTED NOT DETECTED Final   Metapneumovirus DETECTED (A) NOT DETECTED Final   Rhinovirus / Enterovirus NOT DETECTED NOT DETECTED Final   Influenza A NOT DETECTED NOT DETECTED Final   Influenza B NOT DETECTED NOT  DETECTED Final   Parainfluenza Virus 1 NOT DETECTED NOT DETECTED Final   Parainfluenza Virus 2 NOT DETECTED NOT DETECTED Final   Parainfluenza Virus 3 NOT DETECTED NOT DETECTED Final   Parainfluenza Virus 4 NOT DETECTED NOT DETECTED Final   Respiratory Syncytial Virus NOT DETECTED NOT DETECTED Final   Bordetella pertussis NOT DETECTED NOT DETECTED Final   Chlamydophila pneumoniae NOT DETECTED NOT DETECTED Final   Mycoplasma pneumoniae NOT DETECTED NOT DETECTED Final    Radiology Reports Dg Chest 2 View  Result Date: 07/27/2016 CLINICAL DATA:  Shortness of breath EXAM: CHEST  2 VIEW COMPARISON:  07/23/2016 FINDINGS: Low volume film. The cardio pericardial silhouette is enlarged. Bibasilar atelectasis or scarring again noted. Patient is status post median sternotomy. Bones are diffusely demineralized. Telemetry leads overlie the chest. IMPRESSION: Low volume with chronic interstitial coarsening in  basilar atelectasis or scarring. Electronically Signed   By: Misty Stanley M.D.   On: 07/27/2016 15:27   Ct Head Wo Contrast  Result Date: 07/20/2016 CLINICAL DATA:  Headache. EXAM: CT HEAD WITHOUT CONTRAST TECHNIQUE: Contiguous axial images were obtained from the base of the skull through the vertex without intravenous contrast. COMPARISON:  None. FINDINGS: Brain: Mild diffuse cortical atrophy is noted. No mass effect or midline shift is noted. Ventricular size is within normal limits. There is no evidence of mass lesion, hemorrhage or acute infarction. Vascular: No hyperdense vessel or unexpected calcification. Skull: Normal. Negative for fracture or focal lesion. Sinuses/Orbits: No acute finding. Other: None. IMPRESSION: Mild diffuse cortical atrophy. No acute intracranial abnormality seen. Electronically Signed   By: Marijo Conception, M.D.   On: 07/20/2016 14:21   Ct Chest Wo Contrast  Result Date: 07/22/2016 CLINICAL DATA:  Progressive productive cough with shortness of breath and wheezing. EXAM: CT CHEST WITHOUT CONTRAST TECHNIQUE: Multidetector CT imaging of the chest was performed following the standard protocol without IV contrast. COMPARISON:  Radiographs 07/20/2016 and 02/28/2016. FINDINGS: Cardiovascular: There is atherosclerosis of the aorta, great vessels and coronary arteries status post median sternotomy and CABG. No acute vascular findings are seen on noncontrast imaging. The heart size is normal. There is no pericardial effusion. Mediastinum/Nodes: There are no enlarged mediastinal, hilar or axillary lymph nodes.Small calcified right hilar nodes are noted. The thyroid gland, trachea and esophagus demonstrate no significant findings. Lungs/Pleura: There is no pleural effusion. Mild diffuse central airway thickening with patchy peribronchial ground-glass densities, greatest in the right lower lobe. There is no consolidation or suspicious pulmonary nodule. Upper abdomen: Hepatic low density  consistent with steatosis. There are scattered small calcified granulomas in the liver and spleen. Probable small cyst posteriorly in the mid left kidney. Musculoskeletal/Chest wall: No acute osseous findings are seen. There is diffuse ankylosis of the cervicothoracic spine consistent with idiopathic skeletal hyperostosis. There is a large posterior disc osteophyte complex at C2-3, asymmetric to the left. This may contribute to mass effect on the cord at this level. IMPRESSION: 1. Central airway thickening with patchy peribronchial opacities in both lungs, likely early bronchopneumonia or atypical infection. No consolidation. 2. Diffuse atherosclerosis post CABG. 3. Diffuse idiopathic skeletal hyperostosis with large posterior disc osteophyte complex at C2-3 causing canal compromise. Electronically Signed   By: Richardean Sale M.D.   On: 07/22/2016 10:35   Dg Chest Port 1 View  Result Date: 07/23/2016 CLINICAL DATA:  83 year old male with shortness of breath. EXAM: PORTABLE CHEST 1 VIEW COMPARISON:  Chest CT dated 07/22/2016 FINDINGS: Single portable view of the chest demonstrates minimal bibasilar densities,  likely atelectatic changes. There is no focal consolidation, pleural effusion, or pneumothorax. Top-normal cardiac size. Median sternotomy wires and CABG vascular clips. No acute osseous pathology. IMPRESSION: No focal consolidation. Electronically Signed   By: Anner Crete M.D.   On: 07/23/2016 03:42   Dg Chest Port 1 View  Result Date: 07/20/2016 CLINICAL DATA:  83 year old male with shortness of breath and productive cough with yellow mucus for the past 2 days. EXAM: PORTABLE CHEST 1 VIEW COMPARISON:  Chest x-ray 02/28/2016. FINDINGS: Lung volumes are low. No consolidative airspace disease. No pleural effusions. No pneumothorax. No pulmonary nodule or mass noted. Pulmonary vasculature and the cardiomediastinal silhouette are within normal limits. Aortic atherosclerosis. Status post median  sternotomy for CABG including LIMA. IMPRESSION: 1. Low lung volumes without radiographic evidence of acute cardiopulmonary disease. 2. Aortic atherosclerosis. Electronically Signed   By: Vinnie Langton M.D.   On: 07/20/2016 13:36    Time Spent in minutes  30   Lala Lund M.D on 07/29/2016 at 9:57 AM  Between 7am to 7pm - Pager - [phone] ( page via [email or web address], text pages only, please mention full 10 digit call back number). After 7pm go to [email or web address] - password [facility]

## 2016-07-29 NOTE — Clinical Social Work Note (Signed)
Clinical Social Work Assessment  Patient Details  Name: Lawrence Warner MRN: [id number] Date of Birth: 11-22-1918  Date of referral:  07/29/16               Reason for consult:  Facility Placement                Permission sought to share information with:  Facility Sport and exercise psychologist, Family Supports Permission granted to share information::  Yes, Verbal Permission Granted  Name::     Personnel officer::  SNF  Relationship::  son  Contact Information:     Housing/Transportation Living arrangements for the past 2 months:  [place] of Information:  Patient Patient Interpreter Needed:  None Criminal Activity/Legal Involvement Pertinent to Current Situation/Hospitalization:  No - Comment as needed Significant Relationships:  Adult Children Lives with:  Self Do you feel safe going back to the place where you live?  No Need for family participation in patient care:  No (Coment)  Care giving concerns:  Pt lives at [place] apartment alone.  Patient normally independent but currently weak due to bronchitis and does not have sufficient physical support to return home.   Social Worker assessment / plan:  CSW spoke with pt concerning PT recommendation for SNF.  Pt alert, oriented, and in a good humor during interview.  Pt has been to SNF numerous times and is aware of referral process and SNF set up.  Employment status:  Retired Forensic scientist:  [employer] PT Recommendations:  [place] / Referral to community resources:  [place]  Patient/Family's Response to care:  Pt agreeable to SNF and prefers [facility] which is near his family and he has had a good experience with their rehab program in the past.  Patient/Family's Understanding of and Emotional Response to Diagnosis, Current Treatment, and Prognosis: Pt very aware of current medical needs and recommendations- hopeful that he will improve with a short rehab  stay.  Emotional Assessment Appearance:  Appears stated age Attitude/Demeanor/Rapport:    Affect (typically observed):  Appropriate, Pleasant Orientation:  Oriented to Self, Oriented to Place, Oriented to  Time, Oriented to Situation Alcohol / Substance use:  Not Applicable Psych involvement (Current and /or in the community):  No (Comment)  Discharge Needs  Concerns to be addressed:  Care Coordination Readmission within the last 30 days:  Yes Current discharge risk:  Physical Impairment Barriers to Discharge:  Continued Medical Work up   Jorge Ny, LCSW 07/29/2016, 11:27 AM

## 2016-07-30 ENCOUNTER — Other Ambulatory Visit: Payer: Medicare Other

## 2016-07-30 ENCOUNTER — Ambulatory Visit: Payer: Medicare Other

## 2016-07-30 ENCOUNTER — Telehealth: Payer: Self-pay | Admitting: *Deleted

## 2016-07-30 DIAGNOSIS — E569 Vitamin deficiency, unspecified: Secondary | ICD-10-CM | POA: Diagnosis not present

## 2016-07-30 DIAGNOSIS — R0603 Acute respiratory distress: Secondary | ICD-10-CM | POA: Diagnosis not present

## 2016-07-30 DIAGNOSIS — J9601 Acute respiratory failure with hypoxia: Secondary | ICD-10-CM | POA: Diagnosis not present

## 2016-07-30 DIAGNOSIS — R278 Other lack of coordination: Secondary | ICD-10-CM | POA: Diagnosis not present

## 2016-07-30 DIAGNOSIS — R1312 Dysphagia, oropharyngeal phase: Secondary | ICD-10-CM | POA: Diagnosis not present

## 2016-07-30 DIAGNOSIS — I5032 Chronic diastolic (congestive) heart failure: Secondary | ICD-10-CM | POA: Diagnosis not present

## 2016-07-30 DIAGNOSIS — M6281 Muscle weakness (generalized): Secondary | ICD-10-CM | POA: Diagnosis not present

## 2016-07-30 DIAGNOSIS — G8929 Other chronic pain: Secondary | ICD-10-CM | POA: Diagnosis not present

## 2016-07-30 DIAGNOSIS — H04129 Dry eye syndrome of unspecified lacrimal gland: Secondary | ICD-10-CM | POA: Diagnosis not present

## 2016-07-30 DIAGNOSIS — N189 Chronic kidney disease, unspecified: Secondary | ICD-10-CM | POA: Diagnosis not present

## 2016-07-30 DIAGNOSIS — K5900 Constipation, unspecified: Secondary | ICD-10-CM | POA: Diagnosis not present

## 2016-07-30 DIAGNOSIS — E785 Hyperlipidemia, unspecified: Secondary | ICD-10-CM | POA: Diagnosis not present

## 2016-07-30 DIAGNOSIS — D519 Vitamin B12 deficiency anemia, unspecified: Secondary | ICD-10-CM | POA: Diagnosis not present

## 2016-07-30 DIAGNOSIS — J449 Chronic obstructive pulmonary disease, unspecified: Secondary | ICD-10-CM | POA: Diagnosis not present

## 2016-07-30 DIAGNOSIS — E119 Type 2 diabetes mellitus without complications: Secondary | ICD-10-CM | POA: Diagnosis not present

## 2016-07-30 DIAGNOSIS — R262 Difficulty in walking, not elsewhere classified: Secondary | ICD-10-CM | POA: Diagnosis not present

## 2016-07-30 DIAGNOSIS — N179 Acute kidney failure, unspecified: Secondary | ICD-10-CM | POA: Diagnosis not present

## 2016-07-30 DIAGNOSIS — D693 Immune thrombocytopenic purpura: Secondary | ICD-10-CM | POA: Diagnosis not present

## 2016-07-30 DIAGNOSIS — R52 Pain, unspecified: Secondary | ICD-10-CM | POA: Diagnosis not present

## 2016-07-30 DIAGNOSIS — I2510 Atherosclerotic heart disease of native coronary artery without angina pectoris: Secondary | ICD-10-CM | POA: Diagnosis not present

## 2016-07-30 DIAGNOSIS — K219 Gastro-esophageal reflux disease without esophagitis: Secondary | ICD-10-CM | POA: Diagnosis not present

## 2016-07-30 DIAGNOSIS — I10 Essential (primary) hypertension: Secondary | ICD-10-CM | POA: Diagnosis not present

## 2016-07-30 LAB — BASIC METABOLIC PANEL
Anion gap: 11 (ref 5–15)
BUN: 79 mg/dL — ABNORMAL HIGH (ref 6–20)
CO2: 21 mmol/L — AB (ref 22–32)
Calcium: 9 mg/dL (ref 8.9–10.3)
Chloride: 106 mmol/L (ref 101–111)
Creatinine, Ser: 2.1 mg/dL — ABNORMAL HIGH (ref 0.61–1.24)
GFR, EST AFRICAN AMERICAN: 29 mL/min — AB (ref >60)
GFR, EST NON AFRICAN AMERICAN: 25 mL/min — AB (ref >60)
GLUCOSE: 163 mg/dL — AB (ref 65–99)
POTASSIUM: 4.6 mmol/L (ref 3.5–5.1)
SODIUM: 138 mmol/L (ref 135–145)

## 2016-07-30 LAB — GLUCOSE, CAPILLARY: Glucose-Capillary: 167 mg/dL — ABNORMAL HIGH (ref 65–99)

## 2016-07-30 MED ORDER — SODIUM CHLORIDE 0.9 % IV BOLUS (SEPSIS)
250.0000 mL | Freq: Once | INTRAVENOUS | Status: AC
  Administered 2016-07-30: 250 mL via INTRAVENOUS

## 2016-07-30 MED ORDER — METOPROLOL TARTRATE 25 MG/10 ML ORAL SUSPENSION: 6.2500 mg | Freq: Two times a day (BID) | ORAL | Status: DC

## 2016-07-30 MED ORDER — DOCUSATE SODIUM 100 MG PO CAPS: 100.0000 mg | ORAL_CAPSULE | Freq: Two times a day (BID) | ORAL | 0 refills | Status: DC | PRN

## 2016-07-30 MED ORDER — LEVOFLOXACIN 250 MG PO TABS
250.0000 mg | ORAL_TABLET | Freq: Every day | ORAL | 0 refills | Status: DC
Start: 2016-07-30 — End: 2016-08-06

## 2016-07-30 MED ORDER — METHYLPREDNISOLONE 4 MG PO TBPK: ORAL_TABLET | ORAL | 0 refills | Status: DC

## 2016-07-30 MED ORDER — METOPROLOL TARTRATE 25 MG/10 ML ORAL SUSPENSION
3.1250 mg | Freq: Two times a day (BID) | ORAL | Status: DC
  Administered 2016-07-30: 3.25 mg via ORAL

## 2016-07-30 NOTE — Discharge Summary (Signed)
Lawrence Warner MHD:[id number] DOB: May 30, 1918 DOA: 07/27/2016  PCP: Hoyt Koch, MD  Admit date: 07/27/2016  Discharge date: 07/30/2016  Admitted From: Home   Disposition:  SNF   Recommendations for Outpatient Follow-up:   Follow up with PCP in 1-2 weeks  PCP Please obtain BMP/CBC, 2 view CXR in 1week,  (see Discharge instructions)   PCP Please follow up on the following pending results: None   Home Health: None   Equipment/Devices: None  Consultations: None Discharge Condition: Fair   CODE STATUS: DNR   Diet Recommendation: Heart healthy with feeding assistance and aspiration precautions, daily 1.5 L total fluid restriction.   Chief Complaint  Patient presents with  . Fatigue     Brief history of present illness from the day of admission and additional interim summary    JuliusAbernathyis a 83 y.o.male,History of COPD on 2 L nasal cannula home oxygen, CAD post CABG in 2000, left bundle branch block, DM type II, essential hypertension, ITP, BPH, chronic kidney disease stage III baseline creatinine close to 1.5, patient was recently admitted for pneumonia, was discharged yesterday to his home after he got much better per patient and his daughter-in-law, patient since last night started getting progressively weak along with mildly worsening productive cough and shortness of breath. Came back to the ER where he was found to be extremely weak and deconditioned, had productive cough suggestive of acute bronchitis, had a few rales on exam suggestive of mild CHF. I was called to admit.                                                                 Hospital Course      1. Acute on chronic bronchitis with chronic hypoxic respiratory failure due to bronchitis and CHF, underlying COPD. He was treated with  gentle IV steroids, Levaquin along with aggressive flutter valve for pulmonary toiletry with good effect. He has minimal mostly upper airway wheezing and minimal coarse breath sounds now, continue oral Levaquin for 4 more days, placed on Medrol Dosepak, insist that he uses flutter valve every hour while awake at SNF. Encourage patient to be up in chair in the morning times. He is on 2 L nasal cannula oxygen at home which will be continued. Repeat CBC, BMP and a 2 view chest x-ray in a week. Overall clinically much improved.  2.Acute on chronic diastolic CHF last EF 21% on recent echocardiogram. He was initially diuresed with 1 dose of IV Lasix however creatinine bumped hence further diuresis has been stopped, he was gently hydrated creatinine is trending down, at this point no further IV fluids or Lasix. Continue 1.5 L total fluid restriction in 24 hours, repeat BMP in a week. Monitor clinically.   3.CAD status post CABG - left bundle branch block. No acute  issues. Continue supportive care along with statin and beta blocker for secondary prevention.  4.Dyslipidemia. Continue home dose statin.  5.GERD. Placed on PPI.  6. ARF. Has underlying CK D stage III with baseline creatinine close to 1.7. Plan is and #2 above.   8. Mild acute on chronic COPD exacerbation. Exacerbation seems to have resolved, Treatment as in #1 above Medrol Dosepak and Levaquin, has 2 L nasal cannula oxygen at baseline.  9. DM2 - diet controlled at home.    Discharge diagnosis     Principal Problem:   Acute respiratory failure with hypoxemia (HCC) Active Problems:   Bronchitis   CAD (coronary artery disease)   Essential hypertension   GERD (gastroesophageal reflux disease)    Discharge instructions    Discharge Instructions    Discharge instructions    Complete by:  As directed    Follow with Primary MD Hoyt Koch, MD in 7 days   Get CBC, CMP, 2 view Chest X ray checked  by Primary MD  or SNF MD in 5-7 days ( we routinely change or add medications that can affect your baseline labs and fluid status, therefore we recommend that you get the mentioned basic workup next visit with your PCP, your PCP may decide not to get them or add new tests based on their clinical decision)  Activity: As tolerated with Full fall precautions use walker/cane & assistance as needed  Disposition SNF  Diet:    Heart Healthy with feeding assistance and aspiration precautions. Daily fluid restriction of 1.5 L.  For Heart failure patients - Check your Weight same time everyday, if you gain over 2 pounds, or you develop in leg swelling, experience more shortness of breath or chest pain, call your Primary MD immediately. Follow Cardiac Low Salt Diet and 1.5 lit/day fluid restriction.  On your next visit with your primary care physician please Get Medicines reviewed and adjusted.  Please request your Prim.MD to go over all Hospital Tests and Procedure/Radiological results at the follow up, please get all Hospital records sent to your Prim MD by signing hospital release before you go home.  If you experience worsening of your admission symptoms, develop shortness of breath, life threatening emergency, suicidal or homicidal thoughts you must seek medical attention immediately by calling 911 or calling your MD immediately  if symptoms less severe.  You Must read complete instructions/literature along with all the possible adverse reactions/side effects for all the Medicines you take and that have been prescribed to you. Take any new Medicines after you have completely understood and accpet all the possible adverse reactions/side effects.   Do not drive, operate heavy machinery, perform activities at heights, swimming or participation in water activities or provide baby sitting services if your were admitted for syncope or siezures until you have seen by Primary MD or a Neurologist and advised to do so  again.  Do not drive when taking Pain medications.    Do not take more than prescribed Pain, Sleep and Anxiety Medications  Special Instructions: If you have smoked or chewed Tobacco  in the last 2 yrs please stop smoking, stop any regular Alcohol  and or any Recreational drug use.  Wear Seat belts while driving.   Please note  You were cared for by a hospitalist during your hospital stay. If you have any questions about your discharge medications or the care you received while you were in the hospital after you are discharged, you can call the unit and  asked to speak with the hospitalist on call if the hospitalist that took care of you is not available. Once you are discharged, your primary care physician will handle any further medical issues. Please note that NO REFILLS for any discharge medications will be authorized once you are discharged, as it is imperative that you return to your primary care physician (or establish a relationship with a primary care physician if you do not have one) for your aftercare needs so that they can reassess your need for medications and monitor your lab values.   Increase activity slowly    Complete by:  As directed       Discharge Medications   Allergies as of 07/30/2016      Reactions   Alfuzosin Hcl Er Other (See Comments)   Confusion   Penicillins Hives, Other (See Comments)   Has patient had a PCN reaction causing immediate rash, facial/tongue/throat swelling, SOB or lightheadedness with hypotension: Yes Has patient had a PCN reaction causing severe rash involving mucus membranes or skin necrosis: Unknown Has patient had a PCN reaction that required hospitalization: Unknown Has patient had a PCN reaction occurring within the last 10 years: Unknown If all of the above answers are "NO", then may proceed with Cephalosporin use.      Medication List    STOP taking these medications   metoprolol tartrate 25 MG tablet Commonly known as:   LOPRESSOR Replaced by:  metoprolol tartrate 25 mg/10 mL Susp   predniSONE 20 MG tablet Commonly known as:  DELTASONE     TAKE these medications   acetaminophen 500 MG tablet Commonly known as:  TYLENOL Take 500 mg by mouth 2 (two) times daily. May take an additional 500 mg two times a day as needed for pain   docusate sodium 100 MG capsule Commonly known as:  COLACE Take 1 capsule (100 mg total) by mouth 2 (two) times daily as needed for mild constipation.   fluticasone 50 MCG/ACT nasal spray Commonly known as:  FLONASE Place 2 sprays into both nostrils 2 (two) times daily. For 7days   guaiFENesin 600 MG 12 hr tablet Commonly known as:  MUCINEX Take 1 tablet (600 mg total) by mouth 2 (two) times daily. For 5days   hydroxypropyl methylcellulose / hypromellose 2.5 % ophthalmic solution Commonly known as:  ISOPTO TEARS / GONIOVISC Place 1 drop into both eyes 3 (three) times daily as needed for dry eyes.   levofloxacin 250 MG tablet Commonly known as:  LEVAQUIN Take 1 tablet (250 mg total) by mouth daily. For 2 more days What changed:  additional instructions   methylPREDNISolone 4 MG Tbpk tablet Commonly known as:  MEDROL DOSEPAK follow package directions   metoprolol tartrate 25 mg/10 mL Susp Commonly known as:  LOPRESSOR Take 2.5 mLs (6.25 mg total) by mouth 2 (two) times daily. Replaces:  metoprolol tartrate 25 MG tablet   multivitamin with minerals Tabs tablet Take 1 tablet by mouth daily.   nitroGLYCERIN 0.4 MG SL tablet Commonly known as:  NITROSTAT Place 1 tablet (0.4 mg total) under the tongue every 5 (five) minutes as needed for chest pain.   omeprazole 20 MG capsule Commonly known as:  PRILOSEC Take 1 capsule (20 mg total) by mouth 2 (two) times daily before a meal.   simvastatin 20 MG tablet Commonly known as:  ZOCOR Take 20 mg by mouth at bedtime.       Follow-up Information    Hoyt Koch, MD. Schedule an appointment  as soon as possible  for a visit in 1 week(s).   Specialty:  Internal Medicine Contact information: [place] [address] [phone]           Major procedures and Radiology Reports - PLEASE review detailed and final reports thoroughly  -       Dg Chest 2 View  Result Date: 07/27/2016 CLINICAL DATA:  Shortness of breath EXAM: CHEST  2 VIEW COMPARISON:  07/23/2016 FINDINGS: Low volume film. The cardio pericardial silhouette is enlarged. Bibasilar atelectasis or scarring again noted. Patient is status post median sternotomy. Bones are diffusely demineralized. Telemetry leads overlie the chest. IMPRESSION: Low volume with chronic interstitial coarsening in basilar atelectasis or scarring. Electronically Signed   By: Misty Stanley M.D.   On: 07/27/2016 15:27   Ct Head Wo Contrast  Result Date: 07/20/2016 CLINICAL DATA:  Headache. EXAM: CT HEAD WITHOUT CONTRAST TECHNIQUE: Contiguous axial images were obtained from the base of the skull through the vertex without intravenous contrast. COMPARISON:  None. FINDINGS: Brain: Mild diffuse cortical atrophy is noted. No mass effect or midline shift is noted. Ventricular size is within normal limits. There is no evidence of mass lesion, hemorrhage or acute infarction. Vascular: No hyperdense vessel or unexpected calcification. Skull: Normal. Negative for fracture or focal lesion. Sinuses/Orbits: No acute finding. Other: None. IMPRESSION: Mild diffuse cortical atrophy. No acute intracranial abnormality seen. Electronically Signed   By: Marijo Conception, M.D.   On: 07/20/2016 14:21   Ct Chest Wo Contrast  Result Date: 07/22/2016 CLINICAL DATA:  Progressive productive cough with shortness of breath and wheezing. EXAM: CT CHEST WITHOUT CONTRAST TECHNIQUE: Multidetector CT imaging of the chest was performed following the standard protocol without IV contrast. COMPARISON:  Radiographs 07/20/2016 and 02/28/2016. FINDINGS: Cardiovascular: There is atherosclerosis  of the aorta, great vessels and coronary arteries status post median sternotomy and CABG. No acute vascular findings are seen on noncontrast imaging. The heart size is normal. There is no pericardial effusion. Mediastinum/Nodes: There are no enlarged mediastinal, hilar or axillary lymph nodes.Small calcified right hilar nodes are noted. The thyroid gland, trachea and esophagus demonstrate no significant findings. Lungs/Pleura: There is no pleural effusion. Mild diffuse central airway thickening with patchy peribronchial ground-glass densities, greatest in the right lower lobe. There is no consolidation or suspicious pulmonary nodule. Upper abdomen: Hepatic low density consistent with steatosis. There are scattered small calcified granulomas in the liver and spleen. Probable small cyst posteriorly in the mid left kidney. Musculoskeletal/Chest wall: No acute osseous findings are seen. There is diffuse ankylosis of the cervicothoracic spine consistent with idiopathic skeletal hyperostosis. There is a large posterior disc osteophyte complex at C2-3, asymmetric to the left. This may contribute to mass effect on the cord at this level. IMPRESSION: 1. Central airway thickening with patchy peribronchial opacities in both lungs, likely early bronchopneumonia or atypical infection. No consolidation. 2. Diffuse atherosclerosis post CABG. 3. Diffuse idiopathic skeletal hyperostosis with large posterior disc osteophyte complex at C2-3 causing canal compromise. Electronically Signed   By: Richardean Sale M.D.   On: 07/22/2016 10:35   Dg Chest Port 1 View  Result Date: 07/23/2016 CLINICAL DATA:  83 year old male with shortness of breath. EXAM: PORTABLE CHEST 1 VIEW COMPARISON:  Chest CT dated 07/22/2016 FINDINGS: Single portable view of the chest demonstrates minimal bibasilar densities, likely atelectatic changes. There is no focal consolidation, pleural effusion, or pneumothorax. Top-normal cardiac size. Median sternotomy  wires and CABG vascular clips. No acute osseous pathology.  IMPRESSION: No focal consolidation. Electronically Signed   By: Anner Crete M.D.   On: 07/23/2016 03:42   Dg Chest Port 1 View  Result Date: 07/20/2016 CLINICAL DATA:  83 year old male with shortness of breath and productive cough with yellow mucus for the past 2 days. EXAM: PORTABLE CHEST 1 VIEW COMPARISON:  Chest x-ray 02/28/2016. FINDINGS: Lung volumes are low. No consolidative airspace disease. No pleural effusions. No pneumothorax. No pulmonary nodule or mass noted. Pulmonary vasculature and the cardiomediastinal silhouette are within normal limits. Aortic atherosclerosis. Status post median sternotomy for CABG including LIMA. IMPRESSION: 1. Low lung volumes without radiographic evidence of acute cardiopulmonary disease. 2. Aortic atherosclerosis. Electronically Signed   By: Vinnie Langton M.D.   On: 07/20/2016 13:36    Micro Results     Recent Results (from the past 240 hour(s))  Blood culture (routine x 2)     Status: None   Collection Time: 07/20/16  3:45 PM  Result Value Ref Range Status   Specimen Description BLOOD RIGHT ANTECUBITAL  Final   Special Requests   Final    BOTTLES DRAWN AEROBIC AND ANAEROBIC Blood Culture results may not be optimal due to an excessive volume of blood received in culture bottles   Culture NO GROWTH 5 DAYS  Final   Report Status 07/25/2016 FINAL  Final  Blood culture (routine x 2)     Status: None   Collection Time: 07/20/16  3:58 PM  Result Value Ref Range Status   Specimen Description BLOOD RIGHT HAND  Final   Special Requests   Final    BOTTLES DRAWN AEROBIC ONLY Blood Culture adequate volume   Culture NO GROWTH 5 DAYS  Final   Report Status 07/25/2016 FINAL  Final  Respiratory Panel by PCR     Status: Abnormal   Collection Time: 07/24/16  5:11 PM  Result Value Ref Range Status   Adenovirus NOT DETECTED NOT DETECTED Final   Coronavirus 229E NOT DETECTED NOT DETECTED Final    Coronavirus HKU1 NOT DETECTED NOT DETECTED Final   Coronavirus NL63 NOT DETECTED NOT DETECTED Final   Coronavirus OC43 NOT DETECTED NOT DETECTED Final   Metapneumovirus DETECTED (A) NOT DETECTED Final   Rhinovirus / Enterovirus NOT DETECTED NOT DETECTED Final   Influenza A NOT DETECTED NOT DETECTED Final   Influenza B NOT DETECTED NOT DETECTED Final   Parainfluenza Virus 1 NOT DETECTED NOT DETECTED Final   Parainfluenza Virus 2 NOT DETECTED NOT DETECTED Final   Parainfluenza Virus 3 NOT DETECTED NOT DETECTED Final   Parainfluenza Virus 4 NOT DETECTED NOT DETECTED Final   Respiratory Syncytial Virus NOT DETECTED NOT DETECTED Final   Bordetella pertussis NOT DETECTED NOT DETECTED Final   Chlamydophila pneumoniae NOT DETECTED NOT DETECTED Final   Mycoplasma pneumoniae NOT DETECTED NOT DETECTED Final  Culture, expectorated sputum-assessment     Status: None   Collection Time: 07/29/16  3:43 PM  Result Value Ref Range Status   Specimen Description EXPECTORATED SPUTUM  Final   Special Requests NONE  Final   Sputum evaluation   Final    Sputum specimen not acceptable for testing.  Please recollect.   NOTIFIED S WARD RN [id number] 07/29/16 A BROWNING    Report Status 07/29/2016 FINAL  Final    Today   Subjective    Lawrence Warner today has no headache,no chest abdominal pain,no new weakness tingling or numbness, feels much better   Objective   Blood pressure (!) 132/58, pulse (!) 51, temperature 97.5  F (36.4 C), temperature source Oral, resp. rate 20, height 5\' 7"  (1.702 m), weight 92.5 kg (203 lb 14.8 oz), SpO2 98 %.   Intake/Output Summary (Last 24 hours) at 07/30/16 0937 Last data filed at 07/30/16 0815  Gross per 24 hour  Intake           923.33 ml  Output             1300 ml  Net          -376.67 ml    Exam Awake Alert, Oriented x 3, No new F.N deficits, Normal affect [place].AT,PERRAL Supple Neck,No JVD, No cervical lymphadenopathy appriciated.  Symmetrical Chest wall  movement, Good air movement bilaterally, Few coarse breath sounds much better than before, mostly upper airway wheezing RRR,No Gallops,Rubs or new Murmurs, No Parasternal Heave +ve B.Sounds, Abd Soft, Non tender, No organomegaly appriciated, No rebound -guarding or rigidity. No Cyanosis, Clubbing or edema, No new Rash or bruise   Data Review   CBC w Diff: Lab Results  Component Value Date   WBC 11.8 (H) 07/27/2016   HGB 13.0 07/27/2016   HGB 13.0 07/16/2016   HCT 40.0 07/27/2016   HCT 39.8 07/16/2016   PLT 174 07/27/2016   PLT 187 07/16/2016   LYMPHOPCT 21.9 07/16/2016   MONOPCT 14.0 07/16/2016   EOSPCT 2.0 07/16/2016   BASOPCT 0.2 07/16/2016    CMP: Lab Results  Component Value Date   NA 138 07/30/2016   NA 139 07/02/2016   K 4.6 07/30/2016   K 4.6 07/02/2016   CL 106 07/30/2016   CO2 21 (L) 07/30/2016   CO2 22 07/02/2016   BUN 79 (H) 07/30/2016   BUN 23.0 07/02/2016   CREATININE 2.10 (H) 07/30/2016   CREATININE 1.4 (H) 07/02/2016   PROT 7.0 07/20/2016   PROT 7.0 07/02/2016   ALBUMIN 3.6 07/20/2016   ALBUMIN 3.5 07/02/2016   BILITOT 0.3 07/20/2016   BILITOT 0.45 07/02/2016   ALKPHOS 67 07/20/2016   ALKPHOS 79 07/02/2016   AST 35 07/20/2016   AST 22 07/02/2016   ALT 24 07/20/2016   ALT 19 07/02/2016  .   Total Time in preparing paper work, data evaluation and todays exam - 26 minutes  Lala Lund M.D on 07/30/2016 at 9:37 AM  [facility]   Office  [phone]

## 2016-07-30 NOTE — Discharge Instructions (Signed)
Follow with Primary MD Hoyt Koch, MD in 7 days   Get CBC, CMP, 2 view Chest X ray checked  by Primary MD or SNF MD in 5-7 days ( we routinely change or add medications that can affect your baseline labs and fluid status, therefore we recommend that you get the mentioned basic workup next visit with your PCP, your PCP may decide not to get them or add new tests based on their clinical decision)  Activity: As tolerated with Full fall precautions use walker/cane & assistance as needed  Disposition SNF  Diet:    Heart Healthy with feeding assistance and aspiration precautions. Daily fluid restriction of 1.5 L.  For Heart failure patients - Check your Weight same time everyday, if you gain over 2 pounds, or you develop in leg swelling, experience more shortness of breath or chest pain, call your Primary MD immediately. Follow Cardiac Low Salt Diet and 1.5 lit/day fluid restriction.  On your next visit with your primary care physician please Get Medicines reviewed and adjusted.  Please request your Prim.MD to go over all Hospital Tests and Procedure/Radiological results at the follow up, please get all Hospital records sent to your Prim MD by signing hospital release before you go home.  If you experience worsening of your admission symptoms, develop shortness of breath, life threatening emergency, suicidal or homicidal thoughts you must seek medical attention immediately by calling 911 or calling your MD immediately  if symptoms less severe.  You Must read complete instructions/literature along with all the possible adverse reactions/side effects for all the Medicines you take and that have been prescribed to you. Take any new Medicines after you have completely understood and accpet all the possible adverse reactions/side effects.   Do not drive, operate heavy machinery, perform activities at heights, swimming or participation in water activities or provide baby sitting services if your  were admitted for syncope or siezures until you have seen by Primary MD or a Neurologist and advised to do so again.  Do not drive when taking Pain medications.    Do not take more than prescribed Pain, Sleep and Anxiety Medications  Special Instructions: If you have smoked or chewed Tobacco  in the last 2 yrs please stop smoking, stop any regular Alcohol  and or any Recreational drug use.  Wear Seat belts while driving.   Please note  You were cared for by a hospitalist during your hospital stay. If you have any questions about your discharge medications or the care you received while you were in the hospital after you are discharged, you can call the unit and asked to speak with the hospitalist on call if the hospitalist that took care of you is not available. Once you are discharged, your primary care physician will handle any further medical issues. Please note that NO REFILLS for any discharge medications will be authorized once you are discharged, as it is imperative that you return to your primary care physician (or establish a relationship with a primary care physician if you do not have one) for your aftercare needs so that they can reassess your need for medications and monitor your lab values.

## 2016-07-30 NOTE — Telephone Encounter (Signed)
Pt was on TCM list admitted for Fatigue, he had just been recently discharge for pneumonia. Pt was D/C 07/30/16, and was sent to SNF...Johny Chess

## 2016-07-30 NOTE — Progress Notes (Signed)
Report called to facility to [facility].

## 2016-07-30 NOTE — Clinical Social Work Placement (Signed)
   CLINICAL SOCIAL WORK PLACEMENT  NOTE  Date:  07/30/2016  Patient Details  Name: Lawrence Warner MRN: [id number] Date of Birth: [birth year]  Clinical Social Work is seeking post-discharge placement for this patient at the [place] level of care (*CSW will initial, date and re-position this form in  chart as items are completed):  Yes   Patient/family provided with [place] Work Department's list of facilities offering this level of care within the geographic area requested by the patient (or if unable, by the patient's family).  Yes   Patient/family informed of their freedom to choose among providers that offer the needed level of care, that participate in Medicare, Medicaid or managed care program needed by the patient, have an available bed and are willing to accept the patient.  Yes   Patient/family informed of [place]'s ownership interest in [facility] and [facility], as well as of the fact that they are under no obligation to receive care at these facilities.  PASRR submitted to EDS on 07/29/16     PASRR number received on 07/29/16     Existing PASRR number confirmed on       FL2 transmitted to all facilities in geographic area requested by pt/family on 07/29/16     FL2 transmitted to all facilities within larger geographic area on       Patient informed that his/her managed care company has contracts with or will negotiate with certain facilities, including the following:        Yes   Patient/family informed of bed offers received.  Patient chooses bed at [facility]     Physician recommends and patient chooses bed at      Patient to be transferred to [facility] on 07/30/16.  Patient to be transferred to facility by Son by car     Patient family notified on 07/30/16 of transfer.  Name of family member notified:  Son     PHYSICIAN       Additional Comment:    _______________________________________________ Benard Halsted, [place] 07/30/2016, 10:13 AM

## 2016-07-30 NOTE — Progress Notes (Signed)
Patient will DC to: [facility] Anticipated DC date: 07/30/16 Family notified: Son Transport by: son by car   Per MD patient ready for DC to [facility]. RN, patient, patient's family, and facility notified of DC. Discharge Summary sent to facility. RN given number for report [phone]).   CSW signing off.  Cedric Fishman, [place] Social Worker [phone]

## 2016-07-30 NOTE — Progress Notes (Signed)
Lawrence Warner to be D/C'd Skilled nursing facility per MD order.  Discussed with the patient and all questions fully answered.  VSS, Skin clean, dry and intact without evidence of skin break down, no evidence of skin tears noted. IV catheter discontinued intact. Site without signs and symptoms of complications. Dressing and pressure applied.  An After Visit Summary was printed and given to the patient. Per social work no scripts needed.  D/c education completed with patient/family including follow up instructions, medication list, d/c activities limitations if indicated, with other d/c instructions as indicated by MD - patient able to verbalize understanding, all questions fully answered.   Patient instructed to return to ED, call 911, or call MD for any changes in condition.   Patient escorted via [place], and D/C to SNF via private auto.  Allergies as of 07/30/2016      Reactions   Alfuzosin Hcl Er Other (See Comments)   Confusion   Penicillins Hives, Other (See Comments)   Has patient had a PCN reaction causing immediate rash, facial/tongue/throat swelling, SOB or lightheadedness with hypotension: Yes Has patient had a PCN reaction causing severe rash involving mucus membranes or skin necrosis: Unknown Has patient had a PCN reaction that required hospitalization: Unknown Has patient had a PCN reaction occurring within the last 10 years: Unknown If all of the above answers are "NO", then may proceed with Cephalosporin use.      Medication List    STOP taking these medications   metoprolol tartrate 25 MG tablet Commonly known as:  LOPRESSOR Replaced by:  metoprolol tartrate 25 mg/10 mL Susp   predniSONE 20 MG tablet Commonly known as:  DELTASONE     TAKE these medications   acetaminophen 500 MG tablet Commonly known as:  TYLENOL Take 500 mg by mouth 2 (two) times daily. May take an additional 500 mg two times a day as needed for pain   docusate sodium 100 MG capsule Commonly  known as:  COLACE Take 1 capsule (100 mg total) by mouth 2 (two) times daily as needed for mild constipation.   fluticasone 50 MCG/ACT nasal spray Commonly known as:  FLONASE Place 2 sprays into both nostrils 2 (two) times daily. For 7days   guaiFENesin 600 MG 12 hr tablet Commonly known as:  MUCINEX Take 1 tablet (600 mg total) by mouth 2 (two) times daily. For 5days   hydroxypropyl methylcellulose / hypromellose 2.5 % ophthalmic solution Commonly known as:  ISOPTO TEARS / GONIOVISC Place 1 drop into both eyes 3 (three) times daily as needed for dry eyes.   levofloxacin 250 MG tablet Commonly known as:  LEVAQUIN Take 1 tablet (250 mg total) by mouth daily. For 2 more days What changed:  additional instructions   methylPREDNISolone 4 MG Tbpk tablet Commonly known as:  MEDROL DOSEPAK follow package directions   metoprolol tartrate 25 mg/10 mL Susp Commonly known as:  LOPRESSOR Take 2.5 mLs (6.25 mg total) by mouth 2 (two) times daily. Replaces:  metoprolol tartrate 25 MG tablet   multivitamin with minerals Tabs tablet Take 1 tablet by mouth daily.   nitroGLYCERIN 0.4 MG SL tablet Commonly known as:  NITROSTAT Place 1 tablet (0.4 mg total) under the tongue every 5 (five) minutes as needed for chest pain.   omeprazole 20 MG capsule Commonly known as:  PRILOSEC Take 1 capsule (20 mg total) by mouth 2 (two) times daily before a meal.   simvastatin 20 MG tablet Commonly known as:  ZOCOR Take 20  mg by mouth at bedtime.       Betha Loa Burnard Enis 07/30/2016 10:44 AM

## 2016-08-05 ENCOUNTER — Other Ambulatory Visit: Payer: Self-pay | Admitting: *Deleted

## 2016-08-05 DIAGNOSIS — D693 Immune thrombocytopenic purpura: Secondary | ICD-10-CM

## 2016-08-06 ENCOUNTER — Ambulatory Visit (HOSPITAL_BASED_OUTPATIENT_CLINIC_OR_DEPARTMENT_OTHER): Payer: Medicare Other

## 2016-08-06 ENCOUNTER — Other Ambulatory Visit (HOSPITAL_BASED_OUTPATIENT_CLINIC_OR_DEPARTMENT_OTHER): Payer: Medicare Other

## 2016-08-06 ENCOUNTER — Encounter: Payer: Self-pay | Admitting: Hematology

## 2016-08-06 ENCOUNTER — Telehealth: Payer: Self-pay | Admitting: Hematology

## 2016-08-06 ENCOUNTER — Ambulatory Visit (HOSPITAL_BASED_OUTPATIENT_CLINIC_OR_DEPARTMENT_OTHER): Payer: Medicare Other | Admitting: Hematology

## 2016-08-06 VITALS — BP 164/45 | HR 43 | Temp 97.6°F | Resp 24 | Ht 67.0 in | Wt 202.4 lb

## 2016-08-06 DIAGNOSIS — N189 Chronic kidney disease, unspecified: Secondary | ICD-10-CM

## 2016-08-06 DIAGNOSIS — D693 Immune thrombocytopenic purpura: Secondary | ICD-10-CM | POA: Diagnosis not present

## 2016-08-06 DIAGNOSIS — I2510 Atherosclerotic heart disease of native coronary artery without angina pectoris: Secondary | ICD-10-CM

## 2016-08-06 DIAGNOSIS — Z95828 Presence of other vascular implants and grafts: Secondary | ICD-10-CM

## 2016-08-06 LAB — CBC & DIFF AND RETIC
BASO%: 0 % (ref 0.0–2.0)
BASOS ABS: 0 10*3/uL (ref 0.0–0.1)
EOS ABS: 0.1 10*3/uL (ref 0.0–0.5)
EOS%: 0.9 % (ref 0.0–7.0)
HEMATOCRIT: 37.5 % — AB (ref 38.4–49.9)
HEMOGLOBIN: 12 g/dL — AB (ref 13.0–17.1)
IMMATURE RETIC FRACT: 10.7 % — AB (ref 3.00–10.60)
LYMPH%: 11.7 % — AB (ref 14.0–49.0)
MCH: 30.4 pg (ref 27.2–33.4)
MCHC: 32 g/dL (ref 32.0–36.0)
MCV: 94.9 fL (ref 79.3–98.0)
MONO#: 0.9 10*3/uL (ref 0.1–0.9)
MONO%: 11.2 % (ref 0.0–14.0)
NEUT%: 76.2 % — AB (ref 39.0–75.0)
NEUTROS ABS: 6.1 10*3/uL (ref 1.5–6.5)
PLATELETS: 135 10*3/uL — AB (ref 140–400)
RBC: 3.95 10*6/uL — ABNORMAL LOW (ref 4.20–5.82)
RDW: 12.8 % (ref 11.0–14.6)
Retic %: 0.92 % (ref 0.80–1.80)
Retic Ct Abs: 36.34 10*3/uL (ref 34.80–93.90)
WBC: 8 10*3/uL (ref 4.0–10.3)
lymph#: 0.9 10*3/uL (ref 0.9–3.3)

## 2016-08-06 MED ORDER — ELTROMBOPAG OLAMINE 25 MG PO TABS: 25.0000 mg | ORAL_TABLET | Freq: Every day | ORAL | 1 refills | Status: DC

## 2016-08-06 MED ORDER — ROMIPLOSTIM 250 MCG ~~LOC~~ SOLR
50.0000 ug | SUBCUTANEOUS | Status: DC
  Administered 2016-08-06: 50 ug via SUBCUTANEOUS
  Filled 2016-08-06: qty 0.1

## 2016-08-06 NOTE — Telephone Encounter (Signed)
Gave patient avs report and appointments for June and July. Appointments scheduled as close to 9-930am as possible. Dr. Irene Limbo out of office 7/10. Per Dr. Irene Limbo ok to schedule f/u 7/17.

## 2016-08-06 NOTE — Patient Instructions (Signed)
Romiplostim injection What is this medicine? ROMIPLOSTIM ([place] mi PLOE stim) helps your body make more platelets. This medicine is used to treat low platelets caused by chronic idiopathic thrombocytopenic purpura (ITP). This medicine may be used for other purposes; ask your health care provider or pharmacist if you have questions. What should I tell my health care provider before I take this medicine? They need to know if you have any of these conditions: -cancer or myelodysplastic syndrome -low blood counts, like low white cell, platelet, or red cell counts -take medicines that treat or prevent blood clots -an unusual or allergic reaction to romiplostim, mannitol, other medicines, foods, dyes, or preservatives -pregnant or trying to get pregnant -breast-feeding How should I use this medicine? This medicine is for injection under the skin. It is given by a health care professional in a hospital or clinic setting. A special MedGuide will be given to you before your injection. Read this information carefully each time. Talk to your pediatrician regarding the use of this medicine in children. Special care may be needed. Overdosage: If you think you have taken too much of this medicine contact a poison control center or emergency room at once. NOTE: This medicine is only for you. Do not share this medicine with others. What if I miss a dose? It is important not to miss your dose. Call your doctor or health care professional if you are unable to keep an appointment. What may interact with this medicine? Interactions are not expected. This list may not describe all possible interactions. Give your health care provider a list of all the medicines, herbs, non-prescription drugs, or dietary supplements you use. Also tell them if you smoke, drink alcohol, or use illegal drugs. Some items may interact with your medicine. What should I watch for while using this medicine? Your condition will be monitored  carefully while you are receiving this medicine. Visit your prescriber or health care professional for regular checks on your progress and for the needed blood tests. It is important to keep all appointments. What side effects may I notice from receiving this medicine? Side effects that you should report to your doctor or health care professional as soon as possible: -allergic reactions like skin rash, itching or hives, swelling of the face, lips, or tongue -shortness of breath, chest pain, swelling in a leg -unusual bleeding or bruising Side effects that usually do not require medical attention (report to your doctor or health care professional if they continue or are bothersome): -dizziness -headache -muscle aches -pain in arms and legs -stomach pain -trouble sleeping This list may not describe all possible side effects. Call your doctor for medical advice about side effects. You may report side effects to FDA at 1-800-FDA-1088. Where should I keep my medicine? This drug is given in a hospital or clinic and will not be stored at home. NOTE: This sheet is a summary. It may not cover all possible information. If you have questions about this medicine, talk to your doctor, pharmacist, or health care provider.    2016, Elsevier/Gold Standard. (2007-10-12 15:13:04) Cyanocobalamin, Vitamin B12 injection What is this medicine? CYANOCOBALAMIN (sye an oh koe BAL a min) is a man made form of vitamin B12. Vitamin B12 is used in the growth of healthy blood cells, nerve cells, and proteins in the body. It also helps with the metabolism of fats and carbohydrates. This medicine is used to treat people who can not absorb vitamin B12. This medicine may be used for other   purposes; ask your health care provider or pharmacist if you have questions. What should I tell my health care provider before I take this medicine? They need to know if you have any of these conditions: -kidney disease -Leber's  disease -megaloblastic anemia -an unusual or allergic reaction to cyanocobalamin, cobalt, other medicines, foods, dyes, or preservatives -pregnant or trying to get pregnant -breast-feeding How should I use this medicine? This medicine is injected into a muscle or deeply under the skin. It is usually given by a health care professional in a clinic or doctor's office. However, your doctor may teach you how to inject yourself. Follow all instructions. Talk to your pediatrician regarding the use of this medicine in children. Special care may be needed. Overdosage: If you think you have taken too much of this medicine contact a poison control center or emergency room at once. NOTE: This medicine is only for you. Do not share this medicine with others. What if I miss a dose? If you are given your dose at a clinic or doctor's office, call to reschedule your appointment. If you give your own injections and you miss a dose, take it as soon as you can. If it is almost time for your next dose, take only that dose. Do not take double or extra doses. What may interact with this medicine? -colchicine -heavy alcohol intake This list may not describe all possible interactions. Give your health care provider a list of all the medicines, herbs, non-prescription drugs, or dietary supplements you use. Also tell them if you smoke, drink alcohol, or use illegal drugs. Some items may interact with your medicine. What should I watch for while using this medicine? Visit your doctor or health care professional regularly. You may need blood work done while you are taking this medicine. You may need to follow a special diet. Talk to your doctor. Limit your alcohol intake and avoid smoking to get the best benefit. What side effects may I notice from receiving this medicine? Side effects that you should report to your doctor or health care professional as soon as possible: -allergic reactions like skin rash, itching or  hives, swelling of the face, lips, or tongue -blue tint to skin -chest tightness, pain -difficulty breathing, wheezing -dizziness -red, swollen painful area on the leg Side effects that usually do not require medical attention (report to your doctor or health care professional if they continue or are bothersome): -diarrhea -headache This list may not describe all possible side effects. Call your doctor for medical advice about side effects. You may report side effects to FDA at 1-800-FDA-1088. Where should I keep my medicine? Keep out of the reach of children. Store at room temperature between 15 and 30 degrees C (59 and 85 degrees F). Protect from light. Throw away any unused medicine after the expiration date. NOTE: This sheet is a summary. It may not cover all possible information. If you have questions about this medicine, talk to your doctor, pharmacist, or health care provider.    2016, Elsevier/Gold Standard. (2007-05-25 22:10:20)  

## 2016-08-06 NOTE — Patient Instructions (Signed)
Thank you for choosing [facility] to provide your oncology and hematology care.  To afford each patient quality time with our providers, please arrive 30 minutes before your scheduled appointment time.  If you arrive late for your appointment, you may be asked to reschedule.  We strive to give you quality time with our providers, and arriving late affects you and other patients whose appointments are after yours.  If you are a no show for multiple scheduled visits, you may be dismissed from the clinic at the providers discretion.   Again, thank you for choosing [facility], our hope is that these requests will decrease the amount of time that you wait before being seen by our physicians.  ______________________________________________________________________ Should you have questions after your visit to the  Cancer Center, please contact our office at [phone] between the hours of 8:30 and 4:30 p.m.    Voicemails left after 4:30p.m will not be returned until the following business day.   For prescription refill requests, please have your pharmacy contact us directly.  Please also try to allow 48 hours for prescription requests.   Please contact the scheduling department for questions regarding scheduling.  For scheduling of procedures such as PET scans, CT scans, MRI, Ultrasound, etc please contact central scheduling at [phone].   Resources For Cancer Patients and Caregivers:  American Cancer Society:  800-227-2345  Can help patients locate various types of support and financial assistance Cancer Care: 1-800-813-HOPE (4673) Provides financial assistance, online support groups, medication/co-pay assistance.   [place] DSS:  336-641-3447 Where to apply for food stamps, Medicaid, and utility assistance Medicare Rights Center: 800-333-4114 Helps people with Medicare understand their rights and benefits, navigate the Medicare system, and secure the  quality healthcare they deserve SCAT: 336-333-6589 [place] [employer]'s shared-ride transportation service for eligible riders who have a disability that prevents them from riding the fixed route bus.   For additional information on assistance programs please contact our social worker:   Grier Hock/Abigail Elmore:  [phone] 

## 2016-08-08 DIAGNOSIS — J440 Chronic obstructive pulmonary disease with acute lower respiratory infection: Secondary | ICD-10-CM | POA: Diagnosis not present

## 2016-08-08 DIAGNOSIS — J441 Chronic obstructive pulmonary disease with (acute) exacerbation: Secondary | ICD-10-CM | POA: Diagnosis not present

## 2016-08-08 DIAGNOSIS — E119 Type 2 diabetes mellitus without complications: Secondary | ICD-10-CM | POA: Diagnosis not present

## 2016-08-08 DIAGNOSIS — I5022 Chronic systolic (congestive) heart failure: Secondary | ICD-10-CM | POA: Diagnosis not present

## 2016-08-08 DIAGNOSIS — I10 Essential (primary) hypertension: Secondary | ICD-10-CM | POA: Diagnosis not present

## 2016-08-08 DIAGNOSIS — M6281 Muscle weakness (generalized): Secondary | ICD-10-CM | POA: Diagnosis not present

## 2016-08-12 NOTE — Progress Notes (Signed)
.    Hematology oncology clinic note  Date of service: .08/06/2016  Patient Care Team: Hoyt Koch, MD as PCP - General (Internal Medicine)  CHIEF COMPLAINTS/PURPOSE OF CONSULTATION: Follow-up for ITP   Diagnosis: Idiopathic thrombocytopenic purpura   Current treatment: Nplate weekly to maintain reasonable platelet counts >50K. Requirement have ranged from 70mcg/kg to 49mcg/kg. Recently stable counts with 0.5-1 mcg/kg  Previous treatment: Steroids, IVIG.  HISTORY OF PRESENTING ILLNESS:  please see my previous clinic note from 09/28/2014 for details of initial presentation and course of treatment.  Interval history  Patient is here for follow-up regarding his ITP. He notes no acute new concerns. No bleeding. Platelet count is stable at 135k. He was in the hospital for acute respiratory distress thought to be due to bronchitis/pneumonia versus fluid overload. He missed his last dose of Nplate. He is now agreeable to consider using Promacta for his ITP instead of Nplate. No fevers or chills. Notes his breathing is back to baseline.  MEDICAL HISTORY:  Past Medical History:  Diagnosis Date  . BPH (benign prostatic hypertrophy)   . Chronic heart failure ([place])   . Chronic renal insufficiency   . Coronary artery disease    CABG in 2000  . Diabetes type 2, controlled ([place])    Diet-controlled  . Diabetic neuropathy ([place])   . Dyslipidemia   . Hypertension   . ITP (idiopathic thrombocytopenic purpura)   . Osteoarthritis   . Pneumonia     SURGICAL HISTORY: Past Surgical History:  Procedure Laterality Date  . CHOLECYSTECTOMY    . CORONARY ARTERY BYPASS GRAFT  2000  . Right hip replacement  2014  . TONSILLECTOMY      SOCIAL HISTORY: Social History   Social History  . Marital status: Single    Spouse name: N/A  . Number of children: N/A  . Years of education: N/A   Occupational History  . Salesman    Social History Main Topics  . Smoking status: Former Smoker      Packs/day: 2.00    Years: 25.00  . Smokeless tobacco: Never Used  . Alcohol use No  . Drug use: No  . Sexual activity: Not Currently   Other Topics Concern  . Not on file   Social History Narrative    Two children.     He is living in [place] which is an assisted living facility. One of his son is also moving to [facility] and has a Designer, jewellery in religious studies. Patient notes that his other son is a Systems analyst with multiple awards.  FAMILY HISTORY: Family History  Problem Relation Age of Onset  . Lung disease Father   . Cancer Brother     ALLERGIES:  is allergic to alfuzosin hcl er and penicillins.  MEDICATIONS:  Current Outpatient Prescriptions  Medication Sig Dispense Refill  . acetaminophen (TYLENOL) 500 MG tablet Take 500 mg by mouth 2 (two) times daily. May take an additional 500 mg two times a day as needed for pain    . eltrombopag (PROMACTA) 25 MG tablet Take 1 tablet (25 mg total) by mouth daily. Take on an empty stomach, 1 hour before a meal or 2 hours after. 30 tablet 1  . fluticasone (FLONASE) 50 MCG/ACT nasal spray Place 2 sprays into both nostrils 2 (two) times daily. For 7days 5 g 0  . hydroxypropyl methylcellulose / hypromellose (ISOPTO TEARS / GONIOVISC) 2.5 % ophthalmic solution Place 1 drop into both eyes 3 (three) times daily  as needed for dry eyes.    . metoprolol tartrate (LOPRESSOR) 25 mg/10 mL SUSP Take 2.5 mLs (6.25 mg total) by mouth 2 (two) times daily.    . Multiple Vitamin (MULTIVITAMIN WITH MINERALS) TABS tablet Take 1 tablet by mouth daily.    . nitroGLYCERIN (NITROSTAT) 0.4 MG SL tablet Place 1 tablet (0.4 mg total) under the tongue every 5 (five) minutes as needed for chest pain. 25 tablet 3  . omeprazole (PRILOSEC) 20 MG capsule Take 1 capsule (20 mg total) by mouth 2 (two) times daily before a meal.    . simvastatin (ZOCOR) 20 MG tablet Take 20 mg by mouth at bedtime.      No current facility-administered medications  for this visit.    Facility-Administered Medications Ordered in Other Visits  Medication Dose Route Frequency Provider Last Rate Last Dose  . cyanocobalamin ((VITAMIN B-12)) injection 1,000 mcg  1,000 mcg Subcutaneous Q30 days [name], MD   1,000 mcg at 03/13/16 1348  . cyanocobalamin ((VITAMIN B-12)) injection 1,000 mcg  1,000 mcg Subcutaneous Q30 days [name], MD   1,000 mcg at 05/14/16 1001  . romiPLOStim (NPLATE) injection 50 mcg  0.5 mcg/kg Subcutaneous Weekly [name], MD   50 mcg at 03/13/16 1349  . romiPLOStim (NPLATE) injection 50 mcg  50 mcg Subcutaneous Weekly [name], MD   50 mcg at 05/07/16 1106  . romiPLOStim (NPLATE) injection 50 mcg  0.5 mcg/kg Subcutaneous Weekly [name], MD   50 mcg at 05/14/16 1001   Romiplostim 5 mcg/kg weekly   REVIEW OF SYSTEMS:   10 point review of system is negative except as noted above  PHYSICAL EXAMINATION: ECOG PERFORMANCE STATUS: 1 - Symptomatic but completely ambulatory  Vitals:   08/06/16 0959  BP: (!) 164/45  Pulse: (!) 43  Resp: (!) 24  Temp: 97.6 F (36.4 C)   Filed Weights   08/06/16 0959  Weight: 202 lb 6.4 oz (91.8 kg)   GENERAL: Elderly gentleman in no acute distress:alert and comfortable SKIN: skin color, texture, turgor are normal, no rashes or significant lesions EYES: normal, conjunctiva are pink and non-injected, sclera clear OROPHARYNX:no exudate, no erythema and lips, buccal mucosa, and tongue normal  NECK: supple, thyroid normal size, non-tender, without nodularity LYMPH:  no palpable lymphadenopathy in the cervical, axillary or inguinal LUNGS: Air entry bilaterally equal, No rales no rhonchi HEART: regular rate & rhythm and 2 x 6 systolic murmur over aortic area, and no lower extremity edema ABDOMEN:abdomen soft, non-tender and normal bowel sounds PSYCH: alert & oriented x 3 with fluent speech NEURO: no focal motor/sensory deficits  LABORATORY DATA:    . CBC Latest Ref Rng & Units 08/06/2016 07/27/2016 07/26/2016  WBC 4.0 - 10.3 10e3/uL 8.0 11.8(H) 11.8(H)  Hemoglobin 13.0 - 17.1 g/dL 12.0(L) 13.0 13.5  Hematocrit 38.4 - 49.9 % 37.5(L) 40.0 41.6  Platelets 140 - 400 10e3/uL 135(L) 174 190    CMP Latest Ref Rng & Units 07/30/2016 07/29/2016 07/28/2016  Glucose 65 - 99 mg/dL 163(H) 229(H) 209(H)  BUN 6 - 20 mg/dL 79(H) 85(H) 67(H)  Creatinine 0.61 - 1.24 mg/dL 2.10(H) 2.20(H) 2.02(H)  Sodium 135 - 145 mmol/L 138 135 136  Potassium 3.5 - 5.1 mmol/L 4.6 4.4 4.4  Chloride 101 - 111 mmol/L 106 104 102  CO2 22 - 32 mmol/L 21(L) 22 24  Calcium 8.9 - 10.3 mg/dL 9.0 9.2 9.2  Total Protein 6.5 - 8.1 g/dL - - -  Total Bilirubin 0.3 - 1.2 mg/dL - - -  Alkaline Phos 38 - 126 U/L - - -  AST 15 - 41 U/L - - -  ALT 17 - 63 U/L - - -    ASSESSMENT & PLAN:   83 year old Caucasian male with multiple medical comorbidities with  #1 chronic ITP since 1989. Has previously been responsive to steroids and has received IVIG on one occasion. He has been maintained on 3-54mcg/kg weekly of Nplate since mid 3361 initially with Dr. Arvin Collard at [facility] and then with Dr. Jimmie Molly at [facility] in [place].  No issues with bleeding . Ultrasound abdomen showed normal spleen size SPEP- no obvious monoclonal protein. IFE showed possibility of an restrictive bands in the IgG and Lambda lanes. Patient's platelet counts have remained remarkably stable at Romiplostim doses of 61mcg/kg and was subsequently cut down to 0.5 mcg/kg and platelets have remained stable with this dose as well.  Platelets are stable today despite his recent flu and pneumonia. Are currently at 186k  #2 recent hospitalization with acute bronchitis/pneumonia. Plan  Patient agreeable with switching to Promacta. Prescription for Promacta 25 mg by mouth daily has been written out and given to our pharmacist to help get it approved. -Would continue Nplate at 2.2ESL/PN weekly until Promacta is  available -continue B12 replacement monthly for B12 deficiency. -Avoid NSAIDs  #2  Coronary artery disease status post CABG in year 2000. Not on aspirin due to his ITP . BNP was elevated. ECHO today shows mildly reduced systolic function and LVH  No chest pain. No shortness of breath. #3 chronic kidney disease  -Continue to management as per primary care physician  Continue Nplate weekly till we are able to get promacta Continue monthly B12 [place] RTC with Dr Irene Limbo in 4 weeks with labs  Total time spent 20 minutes more than 50% time on direct patient contact counseling and coordination of care.    Sullivan Lone MD [place] Hematology/Oncology Physician [facility]  (Office):       [phone] (Work cell):  [phone] (Fax):           [phone]

## 2016-08-13 ENCOUNTER — Ambulatory Visit (HOSPITAL_BASED_OUTPATIENT_CLINIC_OR_DEPARTMENT_OTHER): Payer: Medicare Other

## 2016-08-13 DIAGNOSIS — D693 Immune thrombocytopenic purpura: Secondary | ICD-10-CM

## 2016-08-13 DIAGNOSIS — Z95828 Presence of other vascular implants and grafts: Secondary | ICD-10-CM

## 2016-08-13 MED ORDER — ROMIPLOSTIM 250 MCG ~~LOC~~ SOLR
50.0000 ug | SUBCUTANEOUS | Status: AC
  Administered 2016-08-13: 50 ug via SUBCUTANEOUS
  Filled 2016-08-13: qty 0.1

## 2016-08-13 MED ORDER — CYANOCOBALAMIN 1000 MCG/ML IJ SOLN: 1000.0000 ug | INTRAMUSCULAR | Status: DC

## 2016-08-13 NOTE — Patient Instructions (Signed)
Romiplostim injection What is this medicine? ROMIPLOSTIM ([place] mi PLOE stim) helps your body make more platelets. This medicine is used to treat low platelets caused by chronic idiopathic thrombocytopenic purpura (ITP). This medicine may be used for other purposes; ask your health care provider or pharmacist if you have questions. What should I tell my health care provider before I take this medicine? They need to know if you have any of these conditions: -cancer or myelodysplastic syndrome -low blood counts, like low white cell, platelet, or red cell counts -take medicines that treat or prevent blood clots -an unusual or allergic reaction to romiplostim, mannitol, other medicines, foods, dyes, or preservatives -pregnant or trying to get pregnant -breast-feeding How should I use this medicine? This medicine is for injection under the skin. It is given by a health care professional in a hospital or clinic setting. A special MedGuide will be given to you before your injection. Read this information carefully each time. Talk to your pediatrician regarding the use of this medicine in children. Special care may be needed. Overdosage: If you think you have taken too much of this medicine contact a poison control center or emergency room at once. NOTE: This medicine is only for you. Do not share this medicine with others. What if I miss a dose? It is important not to miss your dose. Call your doctor or health care professional if you are unable to keep an appointment. What may interact with this medicine? Interactions are not expected. This list may not describe all possible interactions. Give your health care provider a list of all the medicines, herbs, non-prescription drugs, or dietary supplements you use. Also tell them if you smoke, drink alcohol, or use illegal drugs. Some items may interact with your medicine. What should I watch for while using this medicine? Your condition will be monitored  carefully while you are receiving this medicine. Visit your prescriber or health care professional for regular checks on your progress and for the needed blood tests. It is important to keep all appointments. What side effects may I notice from receiving this medicine? Side effects that you should report to your doctor or health care professional as soon as possible: -allergic reactions like skin rash, itching or hives, swelling of the face, lips, or tongue -shortness of breath, chest pain, swelling in a leg -unusual bleeding or bruising Side effects that usually do not require medical attention (report to your doctor or health care professional if they continue or are bothersome): -dizziness -headache -muscle aches -pain in arms and legs -stomach pain -trouble sleeping This list may not describe all possible side effects. Call your doctor for medical advice about side effects. You may report side effects to FDA at 1-800-FDA-1088. Where should I keep my medicine? This drug is given in a hospital or clinic and will not be stored at home. NOTE: This sheet is a summary. It may not cover all possible information. If you have questions about this medicine, talk to your doctor, pharmacist, or health care provider.    2016, Elsevier/Gold Standard. (2007-10-12 15:13:04) Cyanocobalamin, Vitamin B12 injection What is this medicine? CYANOCOBALAMIN (sye an oh koe BAL a min) is a man made form of vitamin B12. Vitamin B12 is used in the growth of healthy blood cells, nerve cells, and proteins in the body. It also helps with the metabolism of fats and carbohydrates. This medicine is used to treat people who can not absorb vitamin B12. This medicine may be used for other   purposes; ask your health care provider or pharmacist if you have questions. What should I tell my health care provider before I take this medicine? They need to know if you have any of these conditions: -kidney disease -Leber's  disease -megaloblastic anemia -an unusual or allergic reaction to cyanocobalamin, cobalt, other medicines, foods, dyes, or preservatives -pregnant or trying to get pregnant -breast-feeding How should I use this medicine? This medicine is injected into a muscle or deeply under the skin. It is usually given by a health care professional in a clinic or doctor's office. However, your doctor may teach you how to inject yourself. Follow all instructions. Talk to your pediatrician regarding the use of this medicine in children. Special care may be needed. Overdosage: If you think you have taken too much of this medicine contact a poison control center or emergency room at once. NOTE: This medicine is only for you. Do not share this medicine with others. What if I miss a dose? If you are given your dose at a clinic or doctor's office, call to reschedule your appointment. If you give your own injections and you miss a dose, take it as soon as you can. If it is almost time for your next dose, take only that dose. Do not take double or extra doses. What may interact with this medicine? -colchicine -heavy alcohol intake This list may not describe all possible interactions. Give your health care provider a list of all the medicines, herbs, non-prescription drugs, or dietary supplements you use. Also tell them if you smoke, drink alcohol, or use illegal drugs. Some items may interact with your medicine. What should I watch for while using this medicine? Visit your doctor or health care professional regularly. You may need blood work done while you are taking this medicine. You may need to follow a special diet. Talk to your doctor. Limit your alcohol intake and avoid smoking to get the best benefit. What side effects may I notice from receiving this medicine? Side effects that you should report to your doctor or health care professional as soon as possible: -allergic reactions like skin rash, itching or  hives, swelling of the face, lips, or tongue -blue tint to skin -chest tightness, pain -difficulty breathing, wheezing -dizziness -red, swollen painful area on the leg Side effects that usually do not require medical attention (report to your doctor or health care professional if they continue or are bothersome): -diarrhea -headache This list may not describe all possible side effects. Call your doctor for medical advice about side effects. You may report side effects to FDA at 1-800-FDA-1088. Where should I keep my medicine? Keep out of the reach of children. Store at room temperature between 15 and 30 degrees C (59 and 85 degrees F). Protect from light. Throw away any unused medicine after the expiration date. NOTE: This sheet is a summary. It may not cover all possible information. If you have questions about this medicine, talk to your doctor, pharmacist, or health care provider.    2016, Elsevier/Gold Standard. (2007-05-25 22:10:20)  

## 2016-08-14 ENCOUNTER — Telehealth: Payer: Self-pay | Admitting: Pharmacist

## 2016-08-14 DIAGNOSIS — J441 Chronic obstructive pulmonary disease with (acute) exacerbation: Secondary | ICD-10-CM | POA: Diagnosis not present

## 2016-08-14 DIAGNOSIS — J440 Chronic obstructive pulmonary disease with acute lower respiratory infection: Secondary | ICD-10-CM | POA: Diagnosis not present

## 2016-08-14 DIAGNOSIS — D693 Immune thrombocytopenic purpura: Secondary | ICD-10-CM

## 2016-08-14 MED ORDER — ELTROMBOPAG OLAMINE 25 MG PO TABS: 25.0000 mg | ORAL_TABLET | Freq: Every day | ORAL | 1 refills | Status: DC

## 2016-08-14 NOTE — Telephone Encounter (Signed)
Oral Chemotherapy Pharmacist Encounter  Received new prescription for Promacta for the treatment of Idiopathic thrombocytopenic purpura  Labs reviewed, OK for treatment  Current medication list in Epic assessed, some DDIs with promacta identified:  Promacta and multivitamin: Category D interaction: Administer eltrombopag at least 2 hours before or 4 hours after oral administration of any multivitamin containing polyvalent cations  Promacta and Zocor: Category C interaction: Promacta inhibitsOATP1B1/SLCO1B1 and may lead to increased systemic exposure to Zocor. No change to current therapy indicated at this time. This will be monitored.  Patient gets medications filled through [facility] system, Promacta prescription has been e-scribed to them. No other prescription insurance coverage. We will need to attempt manufacturer assistance if Promacta cannot be filled through the [facility] system. Patient aware.  I spoke with patient for overview of new oral chemotherapy medication: Promacta. Pt is doing well.   Counseled patient on administration, dosing, side effects, safe handling, and monitoring. Patient will take Promacta 25mg  by mouth once daily, on an empty stomach. Patient will take multivitamin in the morning and wait and take Promacta at bedtime so that it is administered on an empty stomach and separated from the MVI.  Side effects include but not limited to: N/V/D, flu-like syptoms, pyrexia, fatigue, and rash.  Lawrence Warner voiced understanding and appreciation.   All questions answered.  Will follow up with patient regarding insurance and pharmacy.   Oral Oncology Clinic will continue to follow.  Thank you,  Johny Drilling, PharmD, BCPS, BCOP 08/14/2016  1:14 PM Oral Oncology Clinic [phone]

## 2016-08-16 DIAGNOSIS — J441 Chronic obstructive pulmonary disease with (acute) exacerbation: Secondary | ICD-10-CM | POA: Diagnosis not present

## 2016-08-16 DIAGNOSIS — J440 Chronic obstructive pulmonary disease with acute lower respiratory infection: Secondary | ICD-10-CM | POA: Diagnosis not present

## 2016-08-19 ENCOUNTER — Other Ambulatory Visit: Payer: Self-pay

## 2016-08-19 DIAGNOSIS — D693 Immune thrombocytopenic purpura: Secondary | ICD-10-CM

## 2016-08-20 ENCOUNTER — Ambulatory Visit (HOSPITAL_BASED_OUTPATIENT_CLINIC_OR_DEPARTMENT_OTHER): Payer: Medicare Other

## 2016-08-20 ENCOUNTER — Other Ambulatory Visit (HOSPITAL_BASED_OUTPATIENT_CLINIC_OR_DEPARTMENT_OTHER): Payer: Medicare Other

## 2016-08-20 ENCOUNTER — Other Ambulatory Visit: Payer: Self-pay | Admitting: Hematology

## 2016-08-20 VITALS — BP 155/55 | HR 48 | Temp 97.7°F | Resp 20

## 2016-08-20 DIAGNOSIS — J441 Chronic obstructive pulmonary disease with (acute) exacerbation: Secondary | ICD-10-CM | POA: Diagnosis not present

## 2016-08-20 DIAGNOSIS — D693 Immune thrombocytopenic purpura: Secondary | ICD-10-CM

## 2016-08-20 DIAGNOSIS — J440 Chronic obstructive pulmonary disease with acute lower respiratory infection: Secondary | ICD-10-CM | POA: Diagnosis not present

## 2016-08-20 DIAGNOSIS — Z95828 Presence of other vascular implants and grafts: Secondary | ICD-10-CM

## 2016-08-20 LAB — CBC WITH DIFFERENTIAL/PLATELET
BASO%: 0.2 % (ref 0.0–2.0)
BASOS ABS: 0 10*3/uL (ref 0.0–0.1)
EOS ABS: 0.1 10*3/uL (ref 0.0–0.5)
EOS%: 1.9 % (ref 0.0–7.0)
HEMATOCRIT: 36.1 % — AB (ref 38.4–49.9)
HEMOGLOBIN: 11.8 g/dL — AB (ref 13.0–17.1)
LYMPH%: 24.6 % (ref 14.0–49.0)
MCH: 30.7 pg (ref 27.2–33.4)
MCHC: 32.7 g/dL (ref 32.0–36.0)
MCV: 93.8 fL (ref 79.3–98.0)
MONO#: 0.5 10*3/uL (ref 0.1–0.9)
MONO%: 13.9 % (ref 0.0–14.0)
NEUT%: 59.4 % (ref 39.0–75.0)
NEUTROS ABS: 2 10*3/uL (ref 1.5–6.5)
PLATELETS: 217 10*3/uL (ref 140–400)
RBC: 3.85 10*6/uL — ABNORMAL LOW (ref 4.20–5.82)
RDW: 13 % (ref 11.0–14.6)
WBC: 3.4 10*3/uL — AB (ref 4.0–10.3)
lymph#: 0.8 10*3/uL — ABNORMAL LOW (ref 0.9–3.3)

## 2016-08-20 LAB — COMPREHENSIVE METABOLIC PANEL
ALBUMIN: 3.1 g/dL — AB (ref 3.5–5.0)
ALK PHOS: 82 U/L (ref 40–150)
ALT: 17 U/L (ref 0–55)
ANION GAP: 9 meq/L (ref 3–11)
AST: 23 U/L (ref 5–34)
BILIRUBIN TOTAL: 0.46 mg/dL (ref 0.20–1.20)
BUN: 19.5 mg/dL (ref 7.0–26.0)
CALCIUM: 9.5 mg/dL (ref 8.4–10.4)
CO2: 24 mEq/L (ref 22–29)
Chloride: 107 mEq/L (ref 98–109)
Creatinine: 1.6 mg/dL — ABNORMAL HIGH (ref 0.7–1.3)
EGFR: 36 mL/min/{1.73_m2} — AB (ref >90)
GLUCOSE: 176 mg/dL — AB (ref 70–140)
Potassium: 4.8 mEq/L (ref 3.5–5.1)
SODIUM: 141 meq/L (ref 136–145)
Total Protein: 6 g/dL — ABNORMAL LOW (ref 6.4–8.3)

## 2016-08-20 MED ORDER — ROMIPLOSTIM 250 MCG ~~LOC~~ SOLR
50.0000 ug | SUBCUTANEOUS | Status: DC
  Administered 2016-08-20: 50 ug via SUBCUTANEOUS
  Filled 2016-08-20: qty 0.1

## 2016-08-20 NOTE — Patient Instructions (Signed)
Romiplostim injection What is this medicine? ROMIPLOSTIM (roe mi PLOE stim) helps your body make more platelets. This medicine is used to treat low platelets caused by chronic idiopathic thrombocytopenic purpura (ITP). This medicine may be used for other purposes; ask your health care provider or pharmacist if you have questions. COMMON BRAND NAME(S): Nplate What should I tell my health care provider before I take this medicine? They need to know if you have any of these conditions: -cancer or myelodysplastic syndrome -low blood counts, like low white cell, platelet, or red cell counts -take medicines that treat or prevent blood clots -an unusual or allergic reaction to romiplostim, mannitol, other medicines, foods, dyes, or preservatives -pregnant or trying to get pregnant -breast-feeding How should I use this medicine? This medicine is for injection under the skin. It is given by a health care professional in a hospital or clinic setting. A special MedGuide will be given to you before your injection. Read this information carefully each time. Talk to your pediatrician regarding the use of this medicine in children. Special care may be needed. Overdosage: If you think you have taken too much of this medicine contact a poison control center or emergency room at once. NOTE: This medicine is only for you. Do not share this medicine with others. What if I miss a dose? It is important not to miss your dose. Call your doctor or health care professional if you are unable to keep an appointment. What may interact with this medicine? Interactions are not expected. This list may not describe all possible interactions. Give your health care provider a list of all the medicines, herbs, non-prescription drugs, or dietary supplements you use. Also tell them if you smoke, drink alcohol, or use illegal drugs. Some items may interact with your medicine. What should I watch for while using this  medicine? Your condition will be monitored carefully while you are receiving this medicine. Visit your prescriber or health care professional for regular checks on your progress and for the needed blood tests. It is important to keep all appointments. What side effects may I notice from receiving this medicine? Side effects that you should report to your doctor or health care professional as soon as possible: -allergic reactions like skin rash, itching or hives, swelling of the face, lips, or tongue -shortness of breath, chest pain, swelling in a leg -unusual bleeding or bruising Side effects that usually do not require medical attention (report to your doctor or health care professional if they continue or are bothersome): -dizziness -headache -muscle aches -pain in arms and legs -stomach pain -trouble sleeping This list may not describe all possible side effects. Call your doctor for medical advice about side effects. You may report side effects to FDA at 1-800-FDA-1088. Where should I keep my medicine? This drug is given in a hospital or clinic and will not be stored at home. NOTE: This sheet is a summary. It may not cover all possible information. If you have questions about this medicine, talk to your doctor, pharmacist, or health care provider.  2018 Elsevier/Gold Standard (2007-10-12 15:13:04)  

## 2016-08-22 ENCOUNTER — Telehealth: Payer: Self-pay | Admitting: Internal Medicine

## 2016-08-22 NOTE — Telephone Encounter (Signed)
Pharmacists called asking if we have any pharmcacies on file they cannot find one

## 2016-08-22 NOTE — Telephone Encounter (Signed)
All we have on file is "[facility] by [facility]"

## 2016-08-22 NOTE — Telephone Encounter (Signed)
Oral Chemotherapy Pharmacist Encounter  I called [facility] on 6/27 to follow-up on status of Promacta prescription e-scribed on 08/14/16. Per [facility], they do not fill medications for this patient. [facility] did have record of several prescriptions e-scribed to them for patient, but they have not and will not be filling them.  I contacted Pricilla Holm, MD's office to see if they had more information about correct outpatient pharmacy, they were surprised that patient does not use [facility].  I then called the [place], [place] and was able to locate the patient in their system.  In order to have a prescription filled for patient from the [place], [place] hardcopy prescriptions must be faxed to the pharmacy at [phone] along with chart notes that explain why a medication has been prescribed. On the fax cover sheet include: patient's last name, last 4 of social security number, DOB, and name/contact number for someone at the office for the pharmacy to call with questions.  Promacta prescription faxed today.  Oral oncology Clinic will continue to follow.  Johny Drilling, PharmD, BCPS, BCOP 08/22/2016  12:52 PM Oral Oncology Clinic [phone]

## 2016-08-23 DIAGNOSIS — J441 Chronic obstructive pulmonary disease with (acute) exacerbation: Secondary | ICD-10-CM | POA: Diagnosis not present

## 2016-08-23 DIAGNOSIS — J440 Chronic obstructive pulmonary disease with acute lower respiratory infection: Secondary | ICD-10-CM | POA: Diagnosis not present

## 2016-08-27 ENCOUNTER — Ambulatory Visit (HOSPITAL_BASED_OUTPATIENT_CLINIC_OR_DEPARTMENT_OTHER): Payer: Medicare Other

## 2016-08-27 DIAGNOSIS — D693 Immune thrombocytopenic purpura: Secondary | ICD-10-CM | POA: Diagnosis present

## 2016-08-27 DIAGNOSIS — J440 Chronic obstructive pulmonary disease with acute lower respiratory infection: Secondary | ICD-10-CM | POA: Diagnosis not present

## 2016-08-27 DIAGNOSIS — J441 Chronic obstructive pulmonary disease with (acute) exacerbation: Secondary | ICD-10-CM | POA: Diagnosis not present

## 2016-08-27 DIAGNOSIS — Z95828 Presence of other vascular implants and grafts: Secondary | ICD-10-CM

## 2016-08-27 MED ORDER — ROMIPLOSTIM 250 MCG ~~LOC~~ SOLR
50.0000 ug | SUBCUTANEOUS | Status: AC
  Administered 2016-08-27: 50 ug via SUBCUTANEOUS
  Filled 2016-08-27: qty 0.1

## 2016-08-27 NOTE — Patient Instructions (Signed)
Romiplostim injection What is this medicine? ROMIPLOSTIM ([place] mi PLOE stim) helps your body make more platelets. This medicine is used to treat low platelets caused by chronic idiopathic thrombocytopenic purpura (ITP). This medicine may be used for other purposes; ask your health care provider or pharmacist if you have questions. What should I tell my health care provider before I take this medicine? They need to know if you have any of these conditions: -cancer or myelodysplastic syndrome -low blood counts, like low white cell, platelet, or red cell counts -take medicines that treat or prevent blood clots -an unusual or allergic reaction to romiplostim, mannitol, other medicines, foods, dyes, or preservatives -pregnant or trying to get pregnant -breast-feeding How should I use this medicine? This medicine is for injection under the skin. It is given by a health care professional in a hospital or clinic setting. A special MedGuide will be given to you before your injection. Read this information carefully each time. Talk to your pediatrician regarding the use of this medicine in children. Special care may be needed. Overdosage: If you think you have taken too much of this medicine contact a poison control center or emergency room at once. NOTE: This medicine is only for you. Do not share this medicine with others. What if I miss a dose? It is important not to miss your dose. Call your doctor or health care professional if you are unable to keep an appointment. What may interact with this medicine? Interactions are not expected. This list may not describe all possible interactions. Give your health care provider a list of all the medicines, herbs, non-prescription drugs, or dietary supplements you use. Also tell them if you smoke, drink alcohol, or use illegal drugs. Some items may interact with your medicine. What should I watch for while using this medicine? Your condition will be monitored  carefully while you are receiving this medicine. Visit your prescriber or health care professional for regular checks on your progress and for the needed blood tests. It is important to keep all appointments. What side effects may I notice from receiving this medicine? Side effects that you should report to your doctor or health care professional as soon as possible: -allergic reactions like skin rash, itching or hives, swelling of the face, lips, or tongue -shortness of breath, chest pain, swelling in a leg -unusual bleeding or bruising Side effects that usually do not require medical attention (report to your doctor or health care professional if they continue or are bothersome): -dizziness -headache -muscle aches -pain in arms and legs -stomach pain -trouble sleeping This list may not describe all possible side effects. Call your doctor for medical advice about side effects. You may report side effects to FDA at 1-800-FDA-1088. Where should I keep my medicine? This drug is given in a hospital or clinic and will not be stored at home. NOTE: This sheet is a summary. It may not cover all possible information. If you have questions about this medicine, talk to your doctor, pharmacist, or health care provider.    2016, Elsevier/Gold Standard. (2007-10-12 15:13:04) Cyanocobalamin, Vitamin B12 injection What is this medicine? CYANOCOBALAMIN (sye an oh koe BAL a min) is a man made form of vitamin B12. Vitamin B12 is used in the growth of healthy blood cells, nerve cells, and proteins in the body. It also helps with the metabolism of fats and carbohydrates. This medicine is used to treat people who can not absorb vitamin B12. This medicine may be used for other   purposes; ask your health care provider or pharmacist if you have questions. What should I tell my health care provider before I take this medicine? They need to know if you have any of these conditions: -kidney disease -Leber's  disease -megaloblastic anemia -an unusual or allergic reaction to cyanocobalamin, cobalt, other medicines, foods, dyes, or preservatives -pregnant or trying to get pregnant -breast-feeding How should I use this medicine? This medicine is injected into a muscle or deeply under the skin. It is usually given by a health care professional in a clinic or doctor's office. However, your doctor may teach you how to inject yourself. Follow all instructions. Talk to your pediatrician regarding the use of this medicine in children. Special care may be needed. Overdosage: If you think you have taken too much of this medicine contact a poison control center or emergency room at once. NOTE: This medicine is only for you. Do not share this medicine with others. What if I miss a dose? If you are given your dose at a clinic or doctor's office, call to reschedule your appointment. If you give your own injections and you miss a dose, take it as soon as you can. If it is almost time for your next dose, take only that dose. Do not take double or extra doses. What may interact with this medicine? -colchicine -heavy alcohol intake This list may not describe all possible interactions. Give your health care provider a list of all the medicines, herbs, non-prescription drugs, or dietary supplements you use. Also tell them if you smoke, drink alcohol, or use illegal drugs. Some items may interact with your medicine. What should I watch for while using this medicine? Visit your doctor or health care professional regularly. You may need blood work done while you are taking this medicine. You may need to follow a special diet. Talk to your doctor. Limit your alcohol intake and avoid smoking to get the best benefit. What side effects may I notice from receiving this medicine? Side effects that you should report to your doctor or health care professional as soon as possible: -allergic reactions like skin rash, itching or  hives, swelling of the face, lips, or tongue -blue tint to skin -chest tightness, pain -difficulty breathing, wheezing -dizziness -red, swollen painful area on the leg Side effects that usually do not require medical attention (report to your doctor or health care professional if they continue or are bothersome): -diarrhea -headache This list may not describe all possible side effects. Call your doctor for medical advice about side effects. You may report side effects to FDA at 1-800-FDA-1088. Where should I keep my medicine? Keep out of the reach of children. Store at room temperature between 15 and 30 degrees C (59 and 85 degrees F). Protect from light. Throw away any unused medicine after the expiration date. NOTE: This sheet is a summary. It may not cover all possible information. If you have questions about this medicine, talk to your doctor, pharmacist, or health care provider.    2016, Elsevier/Gold Standard. (2007-05-25 22:10:20)  

## 2016-08-29 DIAGNOSIS — J440 Chronic obstructive pulmonary disease with acute lower respiratory infection: Secondary | ICD-10-CM | POA: Diagnosis not present

## 2016-08-29 DIAGNOSIS — J441 Chronic obstructive pulmonary disease with (acute) exacerbation: Secondary | ICD-10-CM | POA: Diagnosis not present

## 2016-08-30 DIAGNOSIS — J441 Chronic obstructive pulmonary disease with (acute) exacerbation: Secondary | ICD-10-CM | POA: Diagnosis not present

## 2016-08-30 DIAGNOSIS — J440 Chronic obstructive pulmonary disease with acute lower respiratory infection: Secondary | ICD-10-CM | POA: Diagnosis not present

## 2016-09-02 DIAGNOSIS — J441 Chronic obstructive pulmonary disease with (acute) exacerbation: Secondary | ICD-10-CM | POA: Diagnosis not present

## 2016-09-02 DIAGNOSIS — J440 Chronic obstructive pulmonary disease with acute lower respiratory infection: Secondary | ICD-10-CM | POA: Diagnosis not present

## 2016-09-03 ENCOUNTER — Other Ambulatory Visit (HOSPITAL_BASED_OUTPATIENT_CLINIC_OR_DEPARTMENT_OTHER): Payer: Medicare Other

## 2016-09-03 ENCOUNTER — Ambulatory Visit (HOSPITAL_BASED_OUTPATIENT_CLINIC_OR_DEPARTMENT_OTHER): Payer: Medicare Other

## 2016-09-03 DIAGNOSIS — D693 Immune thrombocytopenic purpura: Secondary | ICD-10-CM

## 2016-09-03 DIAGNOSIS — Z95828 Presence of other vascular implants and grafts: Secondary | ICD-10-CM

## 2016-09-03 LAB — CBC & DIFF AND RETIC
BASO%: 0.2 % (ref 0.0–2.0)
BASOS ABS: 0 10*3/uL (ref 0.0–0.1)
EOS%: 0.7 % (ref 0.0–7.0)
Eosinophils Absolute: 0 10*3/uL (ref 0.0–0.5)
HEMATOCRIT: 37.6 % — AB (ref 38.4–49.9)
HGB: 12 g/dL — ABNORMAL LOW (ref 13.0–17.1)
IMMATURE RETIC FRACT: 11 % — AB (ref 3.00–10.60)
LYMPH#: 1.1 10*3/uL (ref 0.9–3.3)
LYMPH%: 19.6 % (ref 14.0–49.0)
MCH: 30.8 pg (ref 27.2–33.4)
MCHC: 31.9 g/dL — ABNORMAL LOW (ref 32.0–36.0)
MCV: 96.4 fL (ref 79.3–98.0)
MONO#: 0.8 10*3/uL (ref 0.1–0.9)
MONO%: 13.9 % (ref 0.0–14.0)
NEUT#: 3.6 10*3/uL (ref 1.5–6.5)
NEUT%: 65.6 % (ref 39.0–75.0)
PLATELETS: 200 10*3/uL (ref 140–400)
RBC: 3.9 10*6/uL — ABNORMAL LOW (ref 4.20–5.82)
RDW: 13.5 % (ref 11.0–14.6)
RETIC CT ABS: 90.87 10*3/uL (ref 34.80–93.90)
Retic %: 2.33 % — ABNORMAL HIGH (ref 0.80–1.80)
WBC: 5.5 10*3/uL (ref 4.0–10.3)

## 2016-09-03 LAB — COMPREHENSIVE METABOLIC PANEL
ALK PHOS: 85 U/L (ref 40–150)
ALT: 18 U/L (ref 0–55)
ANION GAP: 8 meq/L (ref 3–11)
AST: 21 U/L (ref 5–34)
Albumin: 3.3 g/dL — ABNORMAL LOW (ref 3.5–5.0)
BILIRUBIN TOTAL: 0.48 mg/dL (ref 0.20–1.20)
BUN: 23.4 mg/dL (ref 7.0–26.0)
CALCIUM: 9.6 mg/dL (ref 8.4–10.4)
CO2: 25 mEq/L (ref 22–29)
CREATININE: 1.6 mg/dL — AB (ref 0.7–1.3)
Chloride: 107 mEq/L (ref 98–109)
EGFR: 36 mL/min/{1.73_m2} — AB (ref >90)
Glucose: 198 mg/dl — ABNORMAL HIGH (ref 70–140)
Potassium: 4.8 mEq/L (ref 3.5–5.1)
Sodium: 140 mEq/L (ref 136–145)
TOTAL PROTEIN: 6.5 g/dL (ref 6.4–8.3)

## 2016-09-03 MED ORDER — ROMIPLOSTIM 250 MCG ~~LOC~~ SOLR
50.0000 ug | SUBCUTANEOUS | Status: AC
  Administered 2016-09-03: 50 ug via SUBCUTANEOUS
  Filled 2016-09-03: qty 0.1

## 2016-09-03 NOTE — Patient Instructions (Signed)
Romiplostim injection What is this medicine? ROMIPLOSTIM ([place] mi PLOE stim) helps your body make more platelets. This medicine is used to treat low platelets caused by chronic idiopathic thrombocytopenic purpura (ITP). This medicine may be used for other purposes; ask your health care provider or pharmacist if you have questions. What should I tell my health care provider before I take this medicine? They need to know if you have any of these conditions: -cancer or myelodysplastic syndrome -low blood counts, like low white cell, platelet, or red cell counts -take medicines that treat or prevent blood clots -an unusual or allergic reaction to romiplostim, mannitol, other medicines, foods, dyes, or preservatives -pregnant or trying to get pregnant -breast-feeding How should I use this medicine? This medicine is for injection under the skin. It is given by a health care professional in a hospital or clinic setting. A special MedGuide will be given to you before your injection. Read this information carefully each time. Talk to your pediatrician regarding the use of this medicine in children. Special care may be needed. Overdosage: If you think you have taken too much of this medicine contact a poison control center or emergency room at once. NOTE: This medicine is only for you. Do not share this medicine with others. What if I miss a dose? It is important not to miss your dose. Call your doctor or health care professional if you are unable to keep an appointment. What may interact with this medicine? Interactions are not expected. This list may not describe all possible interactions. Give your health care provider a list of all the medicines, herbs, non-prescription drugs, or dietary supplements you use. Also tell them if you smoke, drink alcohol, or use illegal drugs. Some items may interact with your medicine. What should I watch for while using this medicine? Your condition will be monitored  carefully while you are receiving this medicine. Visit your prescriber or health care professional for regular checks on your progress and for the needed blood tests. It is important to keep all appointments. What side effects may I notice from receiving this medicine? Side effects that you should report to your doctor or health care professional as soon as possible: -allergic reactions like skin rash, itching or hives, swelling of the face, lips, or tongue -shortness of breath, chest pain, swelling in a leg -unusual bleeding or bruising Side effects that usually do not require medical attention (report to your doctor or health care professional if they continue or are bothersome): -dizziness -headache -muscle aches -pain in arms and legs -stomach pain -trouble sleeping This list may not describe all possible side effects. Call your doctor for medical advice about side effects. You may report side effects to FDA at 1-800-FDA-1088. Where should I keep my medicine? This drug is given in a hospital or clinic and will not be stored at home. NOTE: This sheet is a summary. It may not cover all possible information. If you have questions about this medicine, talk to your doctor, pharmacist, or health care provider.    2016, Elsevier/Gold Standard. (2007-10-12 15:13:04) Cyanocobalamin, Vitamin B12 injection What is this medicine? CYANOCOBALAMIN (sye an oh koe BAL a min) is a man made form of vitamin B12. Vitamin B12 is used in the growth of healthy blood cells, nerve cells, and proteins in the body. It also helps with the metabolism of fats and carbohydrates. This medicine is used to treat people who can not absorb vitamin B12. This medicine may be used for other   purposes; ask your health care provider or pharmacist if you have questions. What should I tell my health care provider before I take this medicine? They need to know if you have any of these conditions: -kidney disease -Leber's  disease -megaloblastic anemia -an unusual or allergic reaction to cyanocobalamin, cobalt, other medicines, foods, dyes, or preservatives -pregnant or trying to get pregnant -breast-feeding How should I use this medicine? This medicine is injected into a muscle or deeply under the skin. It is usually given by a health care professional in a clinic or doctor's office. However, your doctor may teach you how to inject yourself. Follow all instructions. Talk to your pediatrician regarding the use of this medicine in children. Special care may be needed. Overdosage: If you think you have taken too much of this medicine contact a poison control center or emergency room at once. NOTE: This medicine is only for you. Do not share this medicine with others. What if I miss a dose? If you are given your dose at a clinic or doctor's office, call to reschedule your appointment. If you give your own injections and you miss a dose, take it as soon as you can. If it is almost time for your next dose, take only that dose. Do not take double or extra doses. What may interact with this medicine? -colchicine -heavy alcohol intake This list may not describe all possible interactions. Give your health care provider a list of all the medicines, herbs, non-prescription drugs, or dietary supplements you use. Also tell them if you smoke, drink alcohol, or use illegal drugs. Some items may interact with your medicine. What should I watch for while using this medicine? Visit your doctor or health care professional regularly. You may need blood work done while you are taking this medicine. You may need to follow a special diet. Talk to your doctor. Limit your alcohol intake and avoid smoking to get the best benefit. What side effects may I notice from receiving this medicine? Side effects that you should report to your doctor or health care professional as soon as possible: -allergic reactions like skin rash, itching or  hives, swelling of the face, lips, or tongue -blue tint to skin -chest tightness, pain -difficulty breathing, wheezing -dizziness -red, swollen painful area on the leg Side effects that usually do not require medical attention (report to your doctor or health care professional if they continue or are bothersome): -diarrhea -headache This list may not describe all possible side effects. Call your doctor for medical advice about side effects. You may report side effects to FDA at 1-800-FDA-1088. Where should I keep my medicine? Keep out of the reach of children. Store at room temperature between 15 and 30 degrees C (59 and 85 degrees F). Protect from light. Throw away any unused medicine after the expiration date. NOTE: This sheet is a summary. It may not cover all possible information. If you have questions about this medicine, talk to your doctor, pharmacist, or health care provider.    2016, Elsevier/Gold Standard. (2007-05-25 22:10:20)  

## 2016-09-05 DIAGNOSIS — J440 Chronic obstructive pulmonary disease with acute lower respiratory infection: Secondary | ICD-10-CM | POA: Diagnosis not present

## 2016-09-05 DIAGNOSIS — J441 Chronic obstructive pulmonary disease with (acute) exacerbation: Secondary | ICD-10-CM | POA: Diagnosis not present

## 2016-09-06 ENCOUNTER — Telehealth: Payer: Self-pay

## 2016-09-06 NOTE — Telephone Encounter (Signed)
Called pt back regarding promacta prescription. Spoke with Denyse Amass today who handles our oral chemotherapies and treatments. She is still working with the [place] and will need to speak with Dr. Irene Limbo upon his return to determine if pt should take Nplate or start on Promacta. Pt is okay to continue on nplate injection regimen until able to provide all of the information and [facility]. Pt verbalized understanding and plans to come on 7/17 for lab, doctor, and injection appt.

## 2016-09-09 ENCOUNTER — Other Ambulatory Visit: Payer: Self-pay | Admitting: *Deleted

## 2016-09-09 DIAGNOSIS — I10 Essential (primary) hypertension: Secondary | ICD-10-CM | POA: Diagnosis not present

## 2016-09-09 DIAGNOSIS — D693 Immune thrombocytopenic purpura: Secondary | ICD-10-CM

## 2016-09-09 DIAGNOSIS — E119 Type 2 diabetes mellitus without complications: Secondary | ICD-10-CM | POA: Diagnosis not present

## 2016-09-09 DIAGNOSIS — I5022 Chronic systolic (congestive) heart failure: Secondary | ICD-10-CM | POA: Diagnosis not present

## 2016-09-09 DIAGNOSIS — M7989 Other specified soft tissue disorders: Secondary | ICD-10-CM | POA: Diagnosis not present

## 2016-09-10 ENCOUNTER — Encounter: Payer: Self-pay | Admitting: Hematology

## 2016-09-10 ENCOUNTER — Ambulatory Visit (HOSPITAL_BASED_OUTPATIENT_CLINIC_OR_DEPARTMENT_OTHER): Payer: Medicare Other

## 2016-09-10 ENCOUNTER — Other Ambulatory Visit (HOSPITAL_BASED_OUTPATIENT_CLINIC_OR_DEPARTMENT_OTHER): Payer: Medicare Other

## 2016-09-10 ENCOUNTER — Ambulatory Visit (HOSPITAL_BASED_OUTPATIENT_CLINIC_OR_DEPARTMENT_OTHER): Payer: Medicare Other | Admitting: Hematology

## 2016-09-10 ENCOUNTER — Telehealth: Payer: Self-pay | Admitting: Hematology

## 2016-09-10 VITALS — BP 154/65 | HR 55 | Temp 97.8°F | Resp 16 | Ht 67.0 in | Wt 214.0 lb

## 2016-09-10 DIAGNOSIS — D693 Immune thrombocytopenic purpura: Secondary | ICD-10-CM

## 2016-09-10 DIAGNOSIS — Z95828 Presence of other vascular implants and grafts: Secondary | ICD-10-CM

## 2016-09-10 DIAGNOSIS — E538 Deficiency of other specified B group vitamins: Secondary | ICD-10-CM

## 2016-09-10 LAB — CBC WITH DIFFERENTIAL/PLATELET
BASO%: 0.4 % (ref 0.0–2.0)
Basophils Absolute: 0 10*3/uL (ref 0.0–0.1)
EOS ABS: 0.1 10*3/uL (ref 0.0–0.5)
EOS%: 1.3 % (ref 0.0–7.0)
HCT: 38.4 % (ref 38.4–49.9)
HEMOGLOBIN: 12.3 g/dL — AB (ref 13.0–17.1)
LYMPH%: 19.9 % (ref 14.0–49.0)
MCH: 31 pg (ref 27.2–33.4)
MCHC: 32 g/dL (ref 32.0–36.0)
MCV: 96.7 fL (ref 79.3–98.0)
MONO#: 0.7 10*3/uL (ref 0.1–0.9)
MONO%: 12.7 % (ref 0.0–14.0)
NEUT%: 65.7 % (ref 39.0–75.0)
NEUTROS ABS: 3.7 10*3/uL (ref 1.5–6.5)
Platelets: 223 10*3/uL (ref 140–400)
RBC: 3.97 10*6/uL — AB (ref 4.20–5.82)
RDW: 13.6 % (ref 11.0–14.6)
WBC: 5.6 10*3/uL (ref 4.0–10.3)
lymph#: 1.1 10*3/uL (ref 0.9–3.3)

## 2016-09-10 LAB — COMPREHENSIVE METABOLIC PANEL
ALBUMIN: 3.4 g/dL — AB (ref 3.5–5.0)
ALK PHOS: 86 U/L (ref 40–150)
ALT: 20 U/L (ref 0–55)
AST: 22 U/L (ref 5–34)
Anion Gap: 11 mEq/L (ref 3–11)
BILIRUBIN TOTAL: 0.4 mg/dL (ref 0.20–1.20)
BUN: 22.9 mg/dL (ref 7.0–26.0)
CO2: 23 meq/L (ref 22–29)
CREATININE: 1.6 mg/dL — AB (ref 0.7–1.3)
Calcium: 9.5 mg/dL (ref 8.4–10.4)
Chloride: 107 mEq/L (ref 98–109)
EGFR: 37 mL/min/{1.73_m2} — AB (ref >90)
GLUCOSE: 163 mg/dL — AB (ref 70–140)
Potassium: 4.6 mEq/L (ref 3.5–5.1)
SODIUM: 141 meq/L (ref 136–145)
TOTAL PROTEIN: 6.5 g/dL (ref 6.4–8.3)

## 2016-09-10 MED ORDER — CYANOCOBALAMIN 1000 MCG/ML IJ SOLN
1000.0000 ug | INTRAMUSCULAR | Status: DC
  Administered 2016-09-10: 1000 ug via SUBCUTANEOUS

## 2016-09-10 MED ORDER — ROMIPLOSTIM 250 MCG ~~LOC~~ SOLR
0.5000 ug/kg | SUBCUTANEOUS | Status: AC
  Administered 2016-09-10: 50 ug via SUBCUTANEOUS
  Filled 2016-09-10: qty 0.1

## 2016-09-10 NOTE — Telephone Encounter (Signed)
Scheduled appt per 7/17 los - Gave patient AVS and calender per los.  

## 2016-09-10 NOTE — Patient Instructions (Signed)
Thank you for choosing [facility] to provide your oncology and hematology care.  To afford each patient quality time with our providers, please arrive 30 minutes before your scheduled appointment time.  If you arrive late for your appointment, you may be asked to reschedule.  We strive to give you quality time with our providers, and arriving late affects you and other patients whose appointments are after yours.   If you are a no show for multiple scheduled visits, you may be dismissed from the clinic at the providers discretion.    Again, thank you for choosing [facility], our hope is that these requests will decrease the amount of time that you wait before being seen by our physicians.  ______________________________________________________________________  Should you have questions after your visit to the [facility], please contact our office at [phone] between the hours of 8:30 and 4:30 p.m.    Voicemails left after 4:30p.m will not be returned until the following business day.    For prescription refill requests, please have your pharmacy contact us directly.  Please also try to allow 48 hours for prescription requests.    Please contact the scheduling department for questions regarding scheduling.  For scheduling of procedures such as PET scans, CT scans, MRI, Ultrasound, etc please contact central scheduling at [phone].    Resources For Cancer Patients and Caregivers:   Oncolink.org:  A wonderful resource for patients and healthcare providers for information regarding your disease, ways to tract your treatment, what to expect, etc.     American Cancer Society:  800-227-2345  Can help patients locate various types of support and financial assistance  Cancer Care: 1-800-813-HOPE (4673) Provides financial assistance, online support groups, medication/co-pay assistance.    [place] DSS:  336-641-3447 Where to apply for food  stamps, Medicaid, and utility assistance  Medicare Rights Center: 800-333-4114 Helps people with Medicare understand their rights and benefits, navigate the Medicare system, and secure the quality healthcare they deserve  SCAT: 336-333-6589 [place] [employer]'s shared-ride transportation service for eligible riders who have a disability that prevents them from riding the fixed route bus.    For additional information on assistance programs please contact our social worker:   Grier Hock/Abigail Elmore:  [phone]            

## 2016-09-12 DIAGNOSIS — L570 Actinic keratosis: Secondary | ICD-10-CM | POA: Diagnosis not present

## 2016-09-15 NOTE — Progress Notes (Signed)
.    Hematology oncology clinic note  Date of service: .09/10/2016  Patient Care Team: Clovia Cuff, MD as PCP - General (Internal Medicine)  CHIEF COMPLAINTS/PURPOSE OF CONSULTATION: Follow-up for ITP   Diagnosis: Idiopathic thrombocytopenic purpura   Current treatment: Nplate weekly to maintain reasonable platelet counts >50K. Requirement have ranged from 61mcg/kg to 85mcg/kg. Recently stable counts with 0.5-1 mcg/kg qweekly  Previous treatment: Steroids, IVIG.  HISTORY OF PRESENTING ILLNESS:  please see my previous clinic note from 09/28/2014 for details of initial presentation and course of treatment.  Interval history  Patient is here for follow-up regarding his ITP. He notes no acute new concerns. No bleeding. Platelet count is stable at 223k. He has not been able to get the Promacta through the [place] system yet. Given stable platelet counts were discussed and decided to wean back Nplate to 7.3XTG/GY [id number] and patient is okay with this. Completely stopping then platelet the past on several occasions has led to progressive drop in his platelet counts No fevers or chills. Notes his breathing is back to baseline.  MEDICAL HISTORY:  Past Medical History:  Diagnosis Date  . BPH (benign prostatic hypertrophy)   . Chronic heart failure ([place])   . Chronic renal insufficiency   . Coronary artery disease    CABG in 2000  . Diabetes type 2, controlled ([place])    Diet-controlled  . Diabetic neuropathy ([place])   . Dyslipidemia   . Hypertension   . ITP (idiopathic thrombocytopenic purpura)   . Osteoarthritis   . Pneumonia     SURGICAL HISTORY: Past Surgical History:  Procedure Laterality Date  . CHOLECYSTECTOMY    . CORONARY ARTERY BYPASS GRAFT  2000  . Right hip replacement  2014  . TONSILLECTOMY      SOCIAL HISTORY: Social History   Social History  . Marital status: Single    Spouse name: N/A  . Number of children: N/A  . Years of education: N/A   Occupational  History  . Salesman    Social History Main Topics  . Smoking status: Former Smoker    Packs/day: 2.00    Years: 25.00  . Smokeless tobacco: Never Used  . Alcohol use No  . Drug use: No  . Sexual activity: Not Currently   Other Topics Concern  . Not on file   Social History Narrative    Two children.     He is living in [place] which is an assisted living facility. One of his son is also moving to [facility] and has a Designer, jewellery in religious studies. Patient notes that his other son is a Systems analyst with multiple awards.  FAMILY HISTORY: Family History  Problem Relation Age of Onset  . Lung disease Father   . Cancer Brother     ALLERGIES:  is allergic to alfuzosin hcl er and penicillins.  MEDICATIONS:  Current Outpatient Prescriptions  Medication Sig Dispense Refill  . acetaminophen (TYLENOL) 500 MG tablet Take 500 mg by mouth 2 (two) times daily. May take an additional 500 mg two times a day as needed for pain    . eltrombopag (PROMACTA) 25 MG tablet Take 1 tablet (25 mg total) by mouth daily. Take on an empty stomach, 1 hour before a meal or 2 hours after. 30 tablet 1  . fluticasone (FLONASE) 50 MCG/ACT nasal spray Place 2 sprays into both nostrils 2 (two) times daily. For 7days 5 g 0  . hydroxypropyl methylcellulose / hypromellose (ISOPTO TEARS / GONIOVISC) 2.5 %  ophthalmic solution Place 1 drop into both eyes 3 (three) times daily as needed for dry eyes.    . metoprolol tartrate (LOPRESSOR) 25 mg/10 mL SUSP Take 2.5 mLs (6.25 mg total) by mouth 2 (two) times daily.    . Multiple Vitamin (MULTIVITAMIN WITH MINERALS) TABS tablet Take 1 tablet by mouth daily.    Marland Kitchen omeprazole (PRILOSEC) 20 MG capsule Take 1 capsule (20 mg total) by mouth 2 (two) times daily before a meal.    . simvastatin (ZOCOR) 20 MG tablet Take 20 mg by mouth at bedtime.     . nitroGLYCERIN (NITROSTAT) 0.4 MG SL tablet Place 1 tablet (0.4 mg total) under the tongue every 5 (five) minutes  as needed for chest pain. 25 tablet 3   No current facility-administered medications for this visit.    Facility-Administered Medications Ordered in Other Visits  Medication Dose Route Frequency Provider Last Rate Last Dose  . cyanocobalamin ((VITAMIN B-12)) injection 1,000 mcg  1,000 mcg Subcutaneous Q30 days [name], MD   1,000 mcg at 03/13/16 1348  . cyanocobalamin ((VITAMIN B-12)) injection 1,000 mcg  1,000 mcg Subcutaneous Q30 days [name], MD   1,000 mcg at 05/14/16 1001  . cyanocobalamin ((VITAMIN B-12)) injection 1,000 mcg  1,000 mcg Subcutaneous Q30 days [name], MD   1,000 mcg at 09/10/16 1022  . romiPLOStim (NPLATE) injection 50 mcg  0.5 mcg/kg Subcutaneous Weekly [name], MD   50 mcg at 03/13/16 1349  . romiPLOStim (NPLATE) injection 50 mcg  50 mcg Subcutaneous Weekly [name], MD   50 mcg at 05/07/16 1106  . romiPLOStim (NPLATE) injection 50 mcg  0.5 mcg/kg Subcutaneous Weekly [name], MD   50 mcg at 05/14/16 1001  . romiPLOStim (NPLATE) injection 50 mcg  50 mcg Subcutaneous Weekly [name], MD   50 mcg at 08/13/16 0931  . romiPLOStim (NPLATE) injection 50 mcg  50 mcg Subcutaneous Weekly [name], MD   50 mcg at 08/27/16 3818  . romiPLOStim (NPLATE) injection 50 mcg  50 mcg Subcutaneous Weekly [name], MD   50 mcg at 09/03/16 1027  . romiPLOStim (NPLATE) injection 50 mcg  0.5 mcg/kg Subcutaneous Weekly [name], MD   50 mcg at 09/10/16 1023   Romiplostim 5 mcg/kg weekly   REVIEW OF SYSTEMS:   10 point review of system is negative except as noted above  PHYSICAL EXAMINATION: ECOG PERFORMANCE STATUS: 1 - Symptomatic but completely ambulatory  Vitals:   09/10/16 0945  BP: (!) 154/65  Pulse: (!) 55  Resp: 16  Temp: 97.8 F (36.6 C)   Filed Weights   09/10/16 0945  Weight: 214 lb (97.1 kg)   GENERAL: Elderly gentleman in no acute distress:alert and  comfortable SKIN: skin color, texture, turgor are normal, no rashes or significant lesions EYES: normal, conjunctiva are pink and non-injected, sclera clear OROPHARYNX:no exudate, no erythema and lips, buccal mucosa, and tongue normal  NECK: supple, thyroid normal size, non-tender, without nodularity LYMPH:  no palpable lymphadenopathy in the cervical, axillary or inguinal LUNGS: Air entry bilaterally equal, No rales no rhonchi HEART: regular rate & rhythm and 2 x 6 systolic murmur over aortic area, and no lower extremity edema ABDOMEN:abdomen soft, non-tender and normal bowel sounds PSYCH: alert & oriented x 3 with fluent speech NEURO: no focal motor/sensory deficits  LABORATORY DATA:  . CBC Latest Ref Rng & Units 09/10/2016 09/03/2016 08/20/2016  WBC 4.0 - 10.3 10e3/uL  5.6 5.5 3.4(L)  Hemoglobin 13.0 - 17.1 g/dL 12.3(L) 12.0(L) 11.8(L)  Hematocrit 38.4 - 49.9 % 38.4 37.6(L) 36.1(L)  Platelets 140 - 400 10e3/uL 223 200 217    CMP Latest Ref Rng & Units 09/10/2016 09/03/2016 08/20/2016  Glucose 70 - 140 mg/dl 163(H) 198(H) 176(H)  BUN 7.0 - 26.0 mg/dL 22.9 23.4 19.5  Creatinine 0.7 - 1.3 mg/dL 1.6(H) 1.6(H) 1.6(H)  Sodium 136 - 145 mEq/L 141 140 141  Potassium 3.5 - 5.1 mEq/L 4.6 4.8 4.8  Chloride 101 - 111 mmol/L - - -  CO2 22 - 29 mEq/L 23 25 24   Calcium 8.4 - 10.4 mg/dL 9.5 9.6 9.5  Total Protein 6.4 - 8.3 g/dL 6.5 6.5 6.0(L)  Total Bilirubin 0.20 - 1.20 mg/dL 0.40 0.48 0.46  Alkaline Phos 40 - 150 U/L 86 85 82  AST 5 - 34 U/L 22 21 23   ALT 0 - 55 U/L 20 18 17     ASSESSMENT & PLAN:   83 year old Caucasian male with multiple medical comorbidities with  #1 Chronic ITP since 1989. Has previously been responsive to steroids and has received IVIG on one occasion. He has been maintained on 3-9mcg/kg weekly of Nplate since mid 5597 initially with Dr. Arvin Collard at [facility] and then with Dr. Jimmie Molly at [facility] in [place].  No issues with bleeding . Ultrasound  abdomen showed normal spleen size SPEP- no obvious monoclonal protein. IFE showed possibility of an restrictive bands in the IgG and Lambda lanes. Patient's platelet counts have remained remarkably stable at Romiplostim doses of  0.5 mcg/kg weekly    #2 H/o B12 deficiency Plan  -awaiting Promacta availability/approval through his insurance. -He shall get his scheduled dose of Nplate today and then we shall reduce it to Nplate at 4.1ULA/GT every other week until Promacta is available -continue B12 replacement monthly for B12 deficiency. -Avoid NSAIDs -labs q2weeks  #2  Coronary artery disease status post CABG in year 2000. Not on aspirin due to his ITP . BNP was elevated. ECHO today shows mildly reduced systolic function and LVH  No chest pain. No shortness of breath. #3 chronic kidney disease  -Continue to management as per primary care physician  Continue Nplate q2weekly till we are able to get promacta Labs q2weeks Continue monthly B12 [place] RTC with Dr Irene Limbo in 4 weeks with labs  Total time spent 20 minutes more than 50% time on direct patient contact counseling and coordination of care.    Sullivan Lone MD [place] Hematology/Oncology Physician [facility]  (Office):       [phone] (Work cell):  [phone] (Fax):           [phone]

## 2016-09-24 ENCOUNTER — Ambulatory Visit (HOSPITAL_BASED_OUTPATIENT_CLINIC_OR_DEPARTMENT_OTHER): Payer: Medicare Other

## 2016-09-24 ENCOUNTER — Other Ambulatory Visit (HOSPITAL_BASED_OUTPATIENT_CLINIC_OR_DEPARTMENT_OTHER): Payer: Medicare Other

## 2016-09-24 VITALS — BP 154/68 | HR 66 | Temp 97.6°F | Resp 18

## 2016-09-24 DIAGNOSIS — D693 Immune thrombocytopenic purpura: Secondary | ICD-10-CM

## 2016-09-24 DIAGNOSIS — Z95828 Presence of other vascular implants and grafts: Secondary | ICD-10-CM

## 2016-09-24 LAB — CBC & DIFF AND RETIC
BASO%: 0.2 % (ref 0.0–2.0)
BASOS ABS: 0 10*3/uL (ref 0.0–0.1)
EOS%: 1.7 % (ref 0.0–7.0)
Eosinophils Absolute: 0.1 10*3/uL (ref 0.0–0.5)
HCT: 39 % (ref 38.4–49.9)
HEMOGLOBIN: 12.6 g/dL — AB (ref 13.0–17.1)
Immature Retic Fract: 6.5 % (ref 3.00–10.60)
LYMPH#: 1.3 10*3/uL (ref 0.9–3.3)
LYMPH%: 24.5 % (ref 14.0–49.0)
MCH: 31 pg (ref 27.2–33.4)
MCHC: 32.3 g/dL (ref 32.0–36.0)
MCV: 96.1 fL (ref 79.3–98.0)
MONO#: 0.8 10*3/uL (ref 0.1–0.9)
MONO%: 14.2 % — AB (ref 0.0–14.0)
NEUT%: 59.4 % (ref 39.0–75.0)
NEUTROS ABS: 3.2 10*3/uL (ref 1.5–6.5)
NRBC: 0 % (ref 0–0)
Platelets: 191 10*3/uL (ref 140–400)
RBC: 4.06 10*6/uL — ABNORMAL LOW (ref 4.20–5.82)
RDW: 13.4 % (ref 11.0–14.6)
RETIC %: 1.74 % (ref 0.80–1.80)
Retic Ct Abs: 70.64 10*3/uL (ref 34.80–93.90)
WBC: 5.3 10*3/uL (ref 4.0–10.3)

## 2016-09-24 LAB — COMPREHENSIVE METABOLIC PANEL
ALBUMIN: 3.5 g/dL (ref 3.5–5.0)
ALK PHOS: 78 U/L (ref 40–150)
ALT: 21 U/L (ref 0–55)
ANION GAP: 8 meq/L (ref 3–11)
AST: 24 U/L (ref 5–34)
BUN: 24.9 mg/dL (ref 7.0–26.0)
CO2: 24 mEq/L (ref 22–29)
Calcium: 9.7 mg/dL (ref 8.4–10.4)
Chloride: 107 mEq/L (ref 98–109)
Creatinine: 1.5 mg/dL — ABNORMAL HIGH (ref 0.7–1.3)
EGFR: 38 mL/min/{1.73_m2} — AB (ref >90)
GLUCOSE: 177 mg/dL — AB (ref 70–140)
POTASSIUM: 5 meq/L (ref 3.5–5.1)
SODIUM: 139 meq/L (ref 136–145)
Total Bilirubin: 0.39 mg/dL (ref 0.20–1.20)
Total Protein: 6.8 g/dL (ref 6.4–8.3)

## 2016-09-24 MED ORDER — ROMIPLOSTIM 250 MCG ~~LOC~~ SOLR
0.5000 ug/kg | SUBCUTANEOUS | Status: DC
  Administered 2016-09-24: 50 ug via SUBCUTANEOUS
  Filled 2016-09-24: qty 0.1

## 2016-10-07 DIAGNOSIS — I10 Essential (primary) hypertension: Secondary | ICD-10-CM | POA: Diagnosis not present

## 2016-10-07 DIAGNOSIS — E119 Type 2 diabetes mellitus without complications: Secondary | ICD-10-CM | POA: Diagnosis not present

## 2016-10-07 DIAGNOSIS — I5022 Chronic systolic (congestive) heart failure: Secondary | ICD-10-CM | POA: Diagnosis not present

## 2016-10-07 DIAGNOSIS — M7989 Other specified soft tissue disorders: Secondary | ICD-10-CM | POA: Diagnosis not present

## 2016-10-08 ENCOUNTER — Ambulatory Visit (HOSPITAL_BASED_OUTPATIENT_CLINIC_OR_DEPARTMENT_OTHER): Payer: Medicare Other

## 2016-10-08 ENCOUNTER — Other Ambulatory Visit (HOSPITAL_BASED_OUTPATIENT_CLINIC_OR_DEPARTMENT_OTHER): Payer: Medicare Other

## 2016-10-08 ENCOUNTER — Ambulatory Visit (HOSPITAL_BASED_OUTPATIENT_CLINIC_OR_DEPARTMENT_OTHER): Payer: Medicare Other | Admitting: Hematology

## 2016-10-08 VITALS — BP 159/63 | HR 53 | Temp 98.0°F | Resp 17 | Ht 67.0 in | Wt 213.5 lb

## 2016-10-08 DIAGNOSIS — Z5111 Encounter for antineoplastic chemotherapy: Secondary | ICD-10-CM

## 2016-10-08 DIAGNOSIS — N189 Chronic kidney disease, unspecified: Secondary | ICD-10-CM | POA: Diagnosis not present

## 2016-10-08 DIAGNOSIS — I2510 Atherosclerotic heart disease of native coronary artery without angina pectoris: Secondary | ICD-10-CM

## 2016-10-08 DIAGNOSIS — D693 Immune thrombocytopenic purpura: Secondary | ICD-10-CM | POA: Diagnosis not present

## 2016-10-08 DIAGNOSIS — Z95828 Presence of other vascular implants and grafts: Secondary | ICD-10-CM

## 2016-10-08 LAB — CBC & DIFF AND RETIC
BASO%: 0.2 % (ref 0.0–2.0)
BASOS ABS: 0 10*3/uL (ref 0.0–0.1)
EOS%: 1.5 % (ref 0.0–7.0)
Eosinophils Absolute: 0.1 10*3/uL (ref 0.0–0.5)
HCT: 39.4 % (ref 38.4–49.9)
HGB: 12.8 g/dL — ABNORMAL LOW (ref 13.0–17.1)
Immature Retic Fract: 8.2 % (ref 3.00–10.60)
LYMPH#: 1.4 10*3/uL (ref 0.9–3.3)
LYMPH%: 25.4 % (ref 14.0–49.0)
MCH: 31 pg (ref 27.2–33.4)
MCHC: 32.5 g/dL (ref 32.0–36.0)
MCV: 95.4 fL (ref 79.3–98.0)
MONO#: 0.6 10*3/uL (ref 0.1–0.9)
MONO%: 10.9 % (ref 0.0–14.0)
NEUT%: 62 % (ref 39.0–75.0)
NEUTROS ABS: 3.4 10*3/uL (ref 1.5–6.5)
NRBC: 0 % (ref 0–0)
PLATELETS: 126 10*3/uL — AB (ref 140–400)
RBC: 4.13 10*6/uL — AB (ref 4.20–5.82)
RDW: 13.3 % (ref 11.0–14.6)
RETIC %: 1.67 % (ref 0.80–1.80)
Retic Ct Abs: 68.97 10*3/uL (ref 34.80–93.90)
WBC: 5.4 10*3/uL (ref 4.0–10.3)

## 2016-10-08 MED ORDER — ROMIPLOSTIM 250 MCG ~~LOC~~ SOLR
0.5000 ug/kg | SUBCUTANEOUS | Status: DC
  Administered 2016-10-08: 50 ug via SUBCUTANEOUS
  Filled 2016-10-08: qty 0.1

## 2016-10-13 NOTE — Progress Notes (Signed)
.    Hematology oncology clinic note  Date of service: .10/08/2016  Patient Care Team: Clovia Cuff, MD as PCP - General (Internal Medicine)  CHIEF COMPLAINTS/PURPOSE OF CONSULTATION: Follow-up for ITP   Diagnosis: Idiopathic thrombocytopenic purpura   Current treatment: Nplate q2 weekly to maintain reasonable platelet counts >50K.  Requirement have ranged from 55mcg/kg to 75mcg/kg. Recently stable counts with 0.5-1 mcg/kg qweekly  Previous treatment: Steroids, IVIG.  HISTORY OF PRESENTING ILLNESS:  please see my previous clinic note from 09/28/2014 for details of initial presentation and course of treatment.  Interval history  Patient is here for follow-up regarding his ITP. He notes no acute new concerns. No bleeding. Platelet count are a little lower but adequate at 126k. He has not been able to get the Promacta through the [place] system yet. No fevers or chills.  In good spirits overall and as no acute new symptoms.  MEDICAL HISTORY:  Past Medical History:  Diagnosis Date  . BPH (benign prostatic hypertrophy)   . Chronic heart failure ([place])   . Chronic renal insufficiency   . Coronary artery disease    CABG in 2000  . Diabetes type 2, controlled ([place])    Diet-controlled  . Diabetic neuropathy ([place])   . Dyslipidemia   . Hypertension   . ITP (idiopathic thrombocytopenic purpura)   . Osteoarthritis   . Pneumonia     SURGICAL HISTORY: Past Surgical History:  Procedure Laterality Date  . CHOLECYSTECTOMY    . CORONARY ARTERY BYPASS GRAFT  2000  . Right hip replacement  2014  . TONSILLECTOMY      SOCIAL HISTORY: Social History   Social History  . Marital status: Single    Spouse name: N/A  . Number of children: N/A  . Years of education: N/A   Occupational History  . Salesman    Social History Main Topics  . Smoking status: Former Smoker    Packs/day: 2.00    Years: 25.00  . Smokeless tobacco: Never Used  . Alcohol use No  . Drug use: No  . Sexual  activity: Not Currently   Other Topics Concern  . Not on file   Social History Narrative    Two children.     He is living in [place] which is an assisted living facility. One of his son is also moving to [facility] and has a Designer, jewellery in religious studies. Patient notes that his other son is a Systems analyst with multiple awards.  FAMILY HISTORY: Family History  Problem Relation Age of Onset  . Lung disease Father   . Cancer Brother     ALLERGIES:  is allergic to alfuzosin hcl er and penicillins.  MEDICATIONS:  Current Outpatient Prescriptions  Medication Sig Dispense Refill  . acetaminophen (TYLENOL) 500 MG tablet Take 500 mg by mouth 2 (two) times daily. May take an additional 500 mg two times a day as needed for pain    . eltrombopag (PROMACTA) 25 MG tablet Take 1 tablet (25 mg total) by mouth daily. Take on an empty stomach, 1 hour before a meal or 2 hours after. 30 tablet 1  . fluticasone (FLONASE) 50 MCG/ACT nasal spray Place 2 sprays into both nostrils 2 (two) times daily. For 7days 5 g 0  . hydroxypropyl methylcellulose / hypromellose (ISOPTO TEARS / GONIOVISC) 2.5 % ophthalmic solution Place 1 drop into both eyes 3 (three) times daily as needed for dry eyes.    . metoprolol tartrate (LOPRESSOR) 25 mg/10 mL SUSP  Take 2.5 mLs (6.25 mg total) by mouth 2 (two) times daily.    . Multiple Vitamin (MULTIVITAMIN WITH MINERALS) TABS tablet Take 1 tablet by mouth daily.    . nitroGLYCERIN (NITROSTAT) 0.4 MG SL tablet Place 1 tablet (0.4 mg total) under the tongue every 5 (five) minutes as needed for chest pain. 25 tablet 3  . omeprazole (PRILOSEC) 20 MG capsule Take 1 capsule (20 mg total) by mouth 2 (two) times daily before a meal.    . simvastatin (ZOCOR) 20 MG tablet Take 20 mg by mouth at bedtime.      No current facility-administered medications for this visit.    Facility-Administered Medications Ordered in Other Visits  Medication Dose Route Frequency  Provider Last Rate Last Dose  . cyanocobalamin ((VITAMIN B-12)) injection 1,000 mcg  1,000 mcg Subcutaneous Q30 days [name], MD   1,000 mcg at 03/13/16 1348  . cyanocobalamin ((VITAMIN B-12)) injection 1,000 mcg  1,000 mcg Subcutaneous Q30 days [name], MD   1,000 mcg at 05/14/16 1001  . cyanocobalamin ((VITAMIN B-12)) injection 1,000 mcg  1,000 mcg Subcutaneous Q30 days [name], MD   1,000 mcg at 09/10/16 1022  . romiPLOStim (NPLATE) injection 50 mcg  0.5 mcg/kg Subcutaneous Weekly [name], MD   50 mcg at 03/13/16 1349  . romiPLOStim (NPLATE) injection 50 mcg  50 mcg Subcutaneous Weekly [name], MD   50 mcg at 05/07/16 1106  . romiPLOStim (NPLATE) injection 50 mcg  0.5 mcg/kg Subcutaneous Weekly [name], MD   50 mcg at 05/14/16 1001  . romiPLOStim (NPLATE) injection 50 mcg  50 mcg Subcutaneous Weekly [name], MD   50 mcg at 08/13/16 0931  . romiPLOStim (NPLATE) injection 50 mcg  50 mcg Subcutaneous Weekly [name], MD   50 mcg at 08/27/16 5364  . romiPLOStim (NPLATE) injection 50 mcg  50 mcg Subcutaneous Weekly [name], MD   50 mcg at 09/03/16 1027  . romiPLOStim (NPLATE) injection 50 mcg  0.5 mcg/kg Subcutaneous Weekly [name], MD   50 mcg at 09/10/16 1023   Romiplostim 5 mcg/kg weekly   REVIEW OF SYSTEMS:   10 point review of system is negative except as noted above  PHYSICAL EXAMINATION: ECOG PERFORMANCE STATUS: 1 - Symptomatic but completely ambulatory  Vitals:   10/08/16 0947  BP: (!) 159/63  Pulse: (!) 53  Resp: 17  Temp: 98 F (36.7 C)  SpO2: 97%   Filed Weights   10/08/16 0947  Weight: 213 lb 8 oz (96.8 kg)   GENERAL: Elderly gentleman in no acute distress:alert and comfortable SKIN: skin color, texture, turgor are normal, no rashes or significant lesions EYES: normal, conjunctiva are pink and non-injected, sclera clear OROPHARYNX:no exudate, no  erythema and lips, buccal mucosa, and tongue normal  NECK: supple, thyroid normal size, non-tender, without nodularity LYMPH:  no palpable lymphadenopathy in the cervical, axillary or inguinal LUNGS: Air entry bilaterally equal, No rales no rhonchi HEART: regular rate & rhythm and 2 x 6 systolic murmur over aortic area, and no lower extremity edema ABDOMEN:abdomen soft, non-tender and normal bowel sounds PSYCH: alert & oriented x 3 with fluent speech NEURO: no focal motor/sensory deficits  LABORATORY DATA:  . CBC Latest Ref Rng & Units 10/08/2016 09/24/2016 09/10/2016  WBC 4.0 - 10.3 10e3/uL 5.4 5.3 5.6  Hemoglobin 13.0 - 17.1 g/dL 12.8(L) 12.6(L) 12.3(L)  Hematocrit 38.4 - 49.9 % 39.4 39.0 38.4  Platelets  140 - 400 10e3/uL 126(L) 191 223    CMP Latest Ref Rng & Units 09/24/2016 09/10/2016 09/03/2016  Glucose 70 - 140 mg/dl 177(H) 163(H) 198(H)  BUN 7.0 - 26.0 mg/dL 24.9 22.9 23.4  Creatinine 0.7 - 1.3 mg/dL 1.5(H) 1.6(H) 1.6(H)  Sodium 136 - 145 mEq/L 139 141 140  Potassium 3.5 - 5.1 mEq/L 5.0 4.6 4.8  Chloride 101 - 111 mmol/L - - -  CO2 22 - 29 mEq/L 24 23 25   Calcium 8.4 - 10.4 mg/dL 9.7 9.5 9.6  Total Protein 6.4 - 8.3 g/dL 6.8 6.5 6.5  Total Bilirubin 0.20 - 1.20 mg/dL 0.39 0.40 0.48  Alkaline Phos 40 - 150 U/L 78 86 85  AST 5 - 34 U/L 24 22 21   ALT 0 - 55 U/L 21 20 18     ASSESSMENT & PLAN:   83 year old Caucasian male with multiple medical comorbidities with  #1 Chronic ITP since 1989. Has previously been responsive to steroids and has received IVIG on one occasion. He has been maintained on 3-100mcg/kg weekly of Nplate since mid 3817 initially with Dr. Arvin Collard at [facility] and then with Dr. Jimmie Molly at [facility] in [place].  No issues with bleeding . Ultrasound abdomen showed normal spleen size SPEP- no obvious monoclonal protein. IFE showed possibility of an restrictive bands in the IgG and Lambda lanes. Patient's platelet counts have remained remarkably  stable at Romiplostim doses of  0.5 mcg/kg weekly    #2 H/o B12 deficiency Plan  -awaiting Promacta availability/approval through the [place] system - notes that he has been given an appointment with a hematologist in the [place] system. -He shall get his scheduled dose of Nplate today and then continue Nplate at 7.1HAF/BX every other week until Promacta is available and till platelet counts >50k. If further downtrend in platelets might need to adjust dose/go back to weekly shots. -continue B12 replacement monthly for B12 deficiency. -Avoid NSAIDs -labs q2weeks  #2  Coronary artery disease status post CABG in year 2000. Not on aspirin due to his ITP . BNP was elevated. ECHO today shows mildly reduced systolic function and LVH  No chest pain. No shortness of breath. #3 chronic kidney disease  -Continue to management as per primary care physician   Continue Nplate q2weeks Labs [id number] RTC with Dr Irene Limbo in 2 months   Total time spent 20 minutes more than 50% time on direct patient contact counseling and coordination of care.    Sullivan Lone MD [place] Hematology/Oncology Physician [facility]  (Office):       [phone] (Work cell):  [phone] (Fax):           [phone]

## 2016-10-22 ENCOUNTER — Ambulatory Visit (HOSPITAL_BASED_OUTPATIENT_CLINIC_OR_DEPARTMENT_OTHER): Payer: Medicare Other

## 2016-10-22 ENCOUNTER — Other Ambulatory Visit (HOSPITAL_BASED_OUTPATIENT_CLINIC_OR_DEPARTMENT_OTHER): Payer: Medicare Other

## 2016-10-22 VITALS — BP 145/65 | HR 65 | Temp 97.8°F | Resp 20

## 2016-10-22 DIAGNOSIS — D693 Immune thrombocytopenic purpura: Secondary | ICD-10-CM

## 2016-10-22 DIAGNOSIS — Z95828 Presence of other vascular implants and grafts: Secondary | ICD-10-CM

## 2016-10-22 LAB — CBC & DIFF AND RETIC
BASO%: 0.2 % (ref 0.0–2.0)
Basophils Absolute: 0 10*3/uL (ref 0.0–0.1)
EOS%: 1.2 % (ref 0.0–7.0)
Eosinophils Absolute: 0.1 10*3/uL (ref 0.0–0.5)
HCT: 37.7 % — ABNORMAL LOW (ref 38.4–49.9)
HGB: 12.3 g/dL — ABNORMAL LOW (ref 13.0–17.1)
IMMATURE RETIC FRACT: 9.1 % (ref 3.00–10.60)
LYMPH#: 1.2 10*3/uL (ref 0.9–3.3)
LYMPH%: 21.8 % (ref 14.0–49.0)
MCH: 31.2 pg (ref 27.2–33.4)
MCHC: 32.6 g/dL (ref 32.0–36.0)
MCV: 95.7 fL (ref 79.3–98.0)
MONO#: 0.6 10*3/uL (ref 0.1–0.9)
MONO%: 11.3 % (ref 0.0–14.0)
NEUT%: 65.5 % (ref 39.0–75.0)
NEUTROS ABS: 3.7 10*3/uL (ref 1.5–6.5)
Platelets: 156 10*3/uL (ref 140–400)
RBC: 3.94 10*6/uL — AB (ref 4.20–5.82)
RDW: 13 % (ref 11.0–14.6)
RETIC %: 1.41 % (ref 0.80–1.80)
RETIC CT ABS: 55.55 10*3/uL (ref 34.80–93.90)
WBC: 5.7 10*3/uL (ref 4.0–10.3)

## 2016-10-22 LAB — COMPREHENSIVE METABOLIC PANEL
ALK PHOS: 64 U/L (ref 40–150)
ALT: 17 U/L (ref 0–55)
AST: 21 U/L (ref 5–34)
Albumin: 3.3 g/dL — ABNORMAL LOW (ref 3.5–5.0)
Anion Gap: 6 mEq/L (ref 3–11)
BILIRUBIN TOTAL: 0.63 mg/dL (ref 0.20–1.20)
BUN: 23 mg/dL (ref 7.0–26.0)
CO2: 26 mEq/L (ref 22–29)
Calcium: 9.7 mg/dL (ref 8.4–10.4)
Chloride: 107 mEq/L (ref 98–109)
Creatinine: 1.5 mg/dL — ABNORMAL HIGH (ref 0.7–1.3)
EGFR: 38 mL/min/{1.73_m2} — ABNORMAL LOW (ref >90)
GLUCOSE: 168 mg/dL — AB (ref 70–140)
Potassium: 4.6 mEq/L (ref 3.5–5.1)
SODIUM: 139 meq/L (ref 136–145)
TOTAL PROTEIN: 6.5 g/dL (ref 6.4–8.3)

## 2016-10-22 MED ORDER — ROMIPLOSTIM 250 MCG ~~LOC~~ SOLR
0.5000 ug/kg | SUBCUTANEOUS | Status: DC
  Administered 2016-10-22: 50 ug via SUBCUTANEOUS
  Filled 2016-10-22: qty 0.1

## 2016-10-24 ENCOUNTER — Telehealth: Payer: Self-pay | Admitting: *Deleted

## 2016-10-24 NOTE — Telephone Encounter (Signed)
FYI "Butch Penny RN with [facility] calling requesting a fax number for our veterans oncologist, Dr. Rico Sheehan to send information to Dr. Irene Limbo on this patient."  Provided fax number for POD [facility], [phone].  No further questions.

## 2016-11-05 ENCOUNTER — Ambulatory Visit (HOSPITAL_BASED_OUTPATIENT_CLINIC_OR_DEPARTMENT_OTHER): Payer: Medicare Other

## 2016-11-05 ENCOUNTER — Other Ambulatory Visit (HOSPITAL_BASED_OUTPATIENT_CLINIC_OR_DEPARTMENT_OTHER): Payer: Medicare Other

## 2016-11-05 VITALS — BP 165/68 | HR 56 | Temp 97.8°F | Resp 18

## 2016-11-05 DIAGNOSIS — D693 Immune thrombocytopenic purpura: Secondary | ICD-10-CM

## 2016-11-05 DIAGNOSIS — Z95828 Presence of other vascular implants and grafts: Secondary | ICD-10-CM

## 2016-11-05 LAB — CBC & DIFF AND RETIC
BASO%: 0.2 % (ref 0.0–2.0)
Basophils Absolute: 0 10*3/uL (ref 0.0–0.1)
EOS%: 1.1 % (ref 0.0–7.0)
Eosinophils Absolute: 0.1 10*3/uL (ref 0.0–0.5)
HEMATOCRIT: 37.6 % — AB (ref 38.4–49.9)
HGB: 12.4 g/dL — ABNORMAL LOW (ref 13.0–17.1)
Immature Retic Fract: 10.7 % — ABNORMAL HIGH (ref 3.00–10.60)
LYMPH%: 19.7 % (ref 14.0–49.0)
MCH: 31.2 pg (ref 27.2–33.4)
MCHC: 33 g/dL (ref 32.0–36.0)
MCV: 94.7 fL (ref 79.3–98.0)
MONO#: 0.7 10*3/uL (ref 0.1–0.9)
MONO%: 11.9 % (ref 0.0–14.0)
NEUT%: 67.1 % (ref 39.0–75.0)
NEUTROS ABS: 4.2 10*3/uL (ref 1.5–6.5)
PLATELETS: 150 10*3/uL (ref 140–400)
RBC: 3.97 10*6/uL — AB (ref 4.20–5.82)
RDW: 12.8 % (ref 11.0–14.6)
RETIC CT ABS: 65.51 10*3/uL (ref 34.80–93.90)
Retic %: 1.65 % (ref 0.80–1.80)
WBC: 6.2 10*3/uL (ref 4.0–10.3)
lymph#: 1.2 10*3/uL (ref 0.9–3.3)
nRBC: 0 % (ref 0–0)

## 2016-11-05 MED ORDER — CYANOCOBALAMIN 1000 MCG/ML IJ SOLN
1000.0000 ug | INTRAMUSCULAR | Status: DC
  Administered 2016-11-05: 1000 ug via SUBCUTANEOUS

## 2016-11-05 MED ORDER — ROMIPLOSTIM 250 MCG ~~LOC~~ SOLR
50.0000 ug | SUBCUTANEOUS | Status: DC
  Administered 2016-11-05: 50 ug via SUBCUTANEOUS
  Filled 2016-11-05: qty 0.1

## 2016-11-05 NOTE — Patient Instructions (Signed)
Romiplostim injection What is this medicine? ROMIPLOSTIM ([place] mi PLOE stim) helps your body make more platelets. This medicine is used to treat low platelets caused by chronic idiopathic thrombocytopenic purpura (ITP). This medicine may be used for other purposes; ask your health care provider or pharmacist if you have questions. What should I tell my health care provider before I take this medicine? They need to know if you have any of these conditions: -cancer or myelodysplastic syndrome -low blood counts, like low white cell, platelet, or red cell counts -take medicines that treat or prevent blood clots -an unusual or allergic reaction to romiplostim, mannitol, other medicines, foods, dyes, or preservatives -pregnant or trying to get pregnant -breast-feeding How should I use this medicine? This medicine is for injection under the skin. It is given by a health care professional in a hospital or clinic setting. A special MedGuide will be given to you before your injection. Read this information carefully each time. Talk to your pediatrician regarding the use of this medicine in children. Special care may be needed. Overdosage: If you think you have taken too much of this medicine contact a poison control center or emergency room at once. NOTE: This medicine is only for you. Do not share this medicine with others. What if I miss a dose? It is important not to miss your dose. Call your doctor or health care professional if you are unable to keep an appointment. What may interact with this medicine? Interactions are not expected. This list may not describe all possible interactions. Give your health care provider a list of all the medicines, herbs, non-prescription drugs, or dietary supplements you use. Also tell them if you smoke, drink alcohol, or use illegal drugs. Some items may interact with your medicine. What should I watch for while using this medicine? Your condition will be monitored  carefully while you are receiving this medicine. Visit your prescriber or health care professional for regular checks on your progress and for the needed blood tests. It is important to keep all appointments. What side effects may I notice from receiving this medicine? Side effects that you should report to your doctor or health care professional as soon as possible: -allergic reactions like skin rash, itching or hives, swelling of the face, lips, or tongue -shortness of breath, chest pain, swelling in a leg -unusual bleeding or bruising Side effects that usually do not require medical attention (report to your doctor or health care professional if they continue or are bothersome): -dizziness -headache -muscle aches -pain in arms and legs -stomach pain -trouble sleeping This list may not describe all possible side effects. Call your doctor for medical advice about side effects. You may report side effects to FDA at 1-800-FDA-1088. Where should I keep my medicine? This drug is given in a hospital or clinic and will not be stored at home. NOTE: This sheet is a summary. It may not cover all possible information. If you have questions about this medicine, talk to your doctor, pharmacist, or health care provider.    2016, Elsevier/Gold Standard. (2007-10-12 15:13:04) Cyanocobalamin, Vitamin B12 injection What is this medicine? CYANOCOBALAMIN (sye an oh koe BAL a min) is a man made form of vitamin B12. Vitamin B12 is used in the growth of healthy blood cells, nerve cells, and proteins in the body. It also helps with the metabolism of fats and carbohydrates. This medicine is used to treat people who can not absorb vitamin B12. This medicine may be used for other   purposes; ask your health care provider or pharmacist if you have questions. What should I tell my health care provider before I take this medicine? They need to know if you have any of these conditions: -kidney disease -Leber's  disease -megaloblastic anemia -an unusual or allergic reaction to cyanocobalamin, cobalt, other medicines, foods, dyes, or preservatives -pregnant or trying to get pregnant -breast-feeding How should I use this medicine? This medicine is injected into a muscle or deeply under the skin. It is usually given by a health care professional in a clinic or doctor's office. However, your doctor may teach you how to inject yourself. Follow all instructions. Talk to your pediatrician regarding the use of this medicine in children. Special care may be needed. Overdosage: If you think you have taken too much of this medicine contact a poison control center or emergency room at once. NOTE: This medicine is only for you. Do not share this medicine with others. What if I miss a dose? If you are given your dose at a clinic or doctor's office, call to reschedule your appointment. If you give your own injections and you miss a dose, take it as soon as you can. If it is almost time for your next dose, take only that dose. Do not take double or extra doses. What may interact with this medicine? -colchicine -heavy alcohol intake This list may not describe all possible interactions. Give your health care provider a list of all the medicines, herbs, non-prescription drugs, or dietary supplements you use. Also tell them if you smoke, drink alcohol, or use illegal drugs. Some items may interact with your medicine. What should I watch for while using this medicine? Visit your doctor or health care professional regularly. You may need blood work done while you are taking this medicine. You may need to follow a special diet. Talk to your doctor. Limit your alcohol intake and avoid smoking to get the best benefit. What side effects may I notice from receiving this medicine? Side effects that you should report to your doctor or health care professional as soon as possible: -allergic reactions like skin rash, itching or  hives, swelling of the face, lips, or tongue -blue tint to skin -chest tightness, pain -difficulty breathing, wheezing -dizziness -red, swollen painful area on the leg Side effects that usually do not require medical attention (report to your doctor or health care professional if they continue or are bothersome): -diarrhea -headache This list may not describe all possible side effects. Call your doctor for medical advice about side effects. You may report side effects to FDA at 1-800-FDA-1088. Where should I keep my medicine? Keep out of the reach of children. Store at room temperature between 15 and 30 degrees C (59 and 85 degrees F). Protect from light. Throw away any unused medicine after the expiration date. NOTE: This sheet is a summary. It may not cover all possible information. If you have questions about this medicine, talk to your doctor, pharmacist, or health care provider.    2016, Elsevier/Gold Standard. (2007-05-25 22:10:20)  

## 2016-11-06 ENCOUNTER — Telehealth: Payer: Self-pay | Admitting: *Deleted

## 2016-11-06 NOTE — Telephone Encounter (Signed)
Received call from pt with message for Dr Marcelle Smiling RN.  He states that his hematologist is Dr Jiles Crocker at [place] # is [phone] x [phone].  He uses Paediatric nurse on [facility]  Message to Dr Launa Flight RN

## 2016-11-12 DIAGNOSIS — I129 Hypertensive chronic kidney disease with stage 1 through stage 4 chronic kidney disease, or unspecified chronic kidney disease: Secondary | ICD-10-CM | POA: Diagnosis not present

## 2016-11-12 DIAGNOSIS — E119 Type 2 diabetes mellitus without complications: Secondary | ICD-10-CM | POA: Diagnosis not present

## 2016-11-12 DIAGNOSIS — N183 Chronic kidney disease, stage 3 (moderate): Secondary | ICD-10-CM | POA: Diagnosis not present

## 2016-11-12 DIAGNOSIS — I5022 Chronic systolic (congestive) heart failure: Secondary | ICD-10-CM | POA: Diagnosis not present

## 2016-11-18 ENCOUNTER — Telehealth: Payer: Self-pay

## 2016-11-18 NOTE — Telephone Encounter (Signed)
Attempt last week to contact [facility]. Unable to get through to someone d/t extended waiting period. This AM called three separate times with the number [phone]. Per pt extension for Dr. Jiles Crocker [phone]. Upon calling number without extension programmed voice immediately answers and says, "We're sorry your call cannot be completed at this time."  Without contact with [facility], we will not be able to further address pt care collaboratively.

## 2016-11-19 ENCOUNTER — Ambulatory Visit (HOSPITAL_BASED_OUTPATIENT_CLINIC_OR_DEPARTMENT_OTHER): Payer: Medicare Other

## 2016-11-19 ENCOUNTER — Other Ambulatory Visit (HOSPITAL_BASED_OUTPATIENT_CLINIC_OR_DEPARTMENT_OTHER): Payer: Medicare Other

## 2016-11-19 ENCOUNTER — Other Ambulatory Visit: Payer: Self-pay

## 2016-11-19 VITALS — BP 159/59 | HR 48 | Temp 98.0°F | Resp 20

## 2016-11-19 DIAGNOSIS — D693 Immune thrombocytopenic purpura: Secondary | ICD-10-CM | POA: Diagnosis present

## 2016-11-19 DIAGNOSIS — Z23 Encounter for immunization: Secondary | ICD-10-CM

## 2016-11-19 DIAGNOSIS — Z95828 Presence of other vascular implants and grafts: Secondary | ICD-10-CM

## 2016-11-19 LAB — CBC & DIFF AND RETIC
BASO%: 0.2 % (ref 0.0–2.0)
Basophils Absolute: 0 10*3/uL (ref 0.0–0.1)
EOS%: 1.5 % (ref 0.0–7.0)
Eosinophils Absolute: 0.1 10*3/uL (ref 0.0–0.5)
HCT: 40 % (ref 38.4–49.9)
HGB: 13.1 g/dL (ref 13.0–17.1)
IMMATURE RETIC FRACT: 6.1 % (ref 3.00–10.60)
LYMPH%: 27.8 % (ref 14.0–49.0)
MCH: 31.1 pg (ref 27.2–33.4)
MCHC: 32.8 g/dL (ref 32.0–36.0)
MCV: 95 fL (ref 79.3–98.0)
MONO#: 0.5 10*3/uL (ref 0.1–0.9)
MONO%: 10.6 % (ref 0.0–14.0)
NEUT%: 59.9 % (ref 39.0–75.0)
NEUTROS ABS: 2.9 10*3/uL (ref 1.5–6.5)
PLATELETS: 147 10*3/uL (ref 140–400)
RBC: 4.21 10*6/uL (ref 4.20–5.82)
RDW: 12.6 % (ref 11.0–14.6)
Retic %: 1.43 % (ref 0.80–1.80)
Retic Ct Abs: 60.2 10*3/uL (ref 34.80–93.90)
WBC: 4.8 10*3/uL (ref 4.0–10.3)
lymph#: 1.3 10*3/uL (ref 0.9–3.3)
nRBC: 0 % (ref 0–0)

## 2016-11-19 MED ORDER — INFLUENZA VAC SPLIT QUAD 0.5 ML IM SUSY
0.5000 mL | PREFILLED_SYRINGE | Freq: Once | INTRAMUSCULAR | Status: AC
  Administered 2016-11-19: 0.5 mL via INTRAMUSCULAR
  Filled 2016-11-19: qty 0.5

## 2016-11-19 MED ORDER — ROMIPLOSTIM 250 MCG ~~LOC~~ SOLR
50.0000 ug | SUBCUTANEOUS | Status: DC
Start: 2016-11-19 — End: 2016-11-19
  Administered 2016-11-19: 50 ug via SUBCUTANEOUS
  Filled 2016-11-19: qty 0.1

## 2016-11-19 NOTE — Patient Instructions (Signed)
Romiplostim injection What is this medicine? ROMIPLOSTIM (roe mi PLOE stim) helps your body make more platelets. This medicine is used to treat low platelets caused by chronic idiopathic thrombocytopenic purpura (ITP). This medicine may be used for other purposes; ask your health care provider or pharmacist if you have questions. COMMON BRAND NAME(S): Nplate What should I tell my health care provider before I take this medicine? They need to know if you have any of these conditions: -cancer or myelodysplastic syndrome -low blood counts, like low white cell, platelet, or red cell counts -take medicines that treat or prevent blood clots -an unusual or allergic reaction to romiplostim, mannitol, other medicines, foods, dyes, or preservatives -pregnant or trying to get pregnant -breast-feeding How should I use this medicine? This medicine is for injection under the skin. It is given by a health care professional in a hospital or clinic setting. A special MedGuide will be given to you before your injection. Read this information carefully each time. Talk to your pediatrician regarding the use of this medicine in children. Special care may be needed. Overdosage: If you think you have taken too much of this medicine contact a poison control center or emergency room at once. NOTE: This medicine is only for you. Do not share this medicine with others. What if I miss a dose? It is important not to miss your dose. Call your doctor or health care professional if you are unable to keep an appointment. What may interact with this medicine? Interactions are not expected. This list may not describe all possible interactions. Give your health care provider a list of all the medicines, herbs, non-prescription drugs, or dietary supplements you use. Also tell them if you smoke, drink alcohol, or use illegal drugs. Some items may interact with your medicine. What should I watch for while using this  medicine? Your condition will be monitored carefully while you are receiving this medicine. Visit your prescriber or health care professional for regular checks on your progress and for the needed blood tests. It is important to keep all appointments. What side effects may I notice from receiving this medicine? Side effects that you should report to your doctor or health care professional as soon as possible: -allergic reactions like skin rash, itching or hives, swelling of the face, lips, or tongue -shortness of breath, chest pain, swelling in a leg -unusual bleeding or bruising Side effects that usually do not require medical attention (report to your doctor or health care professional if they continue or are bothersome): -dizziness -headache -muscle aches -pain in arms and legs -stomach pain -trouble sleeping This list may not describe all possible side effects. Call your doctor for medical advice about side effects. You may report side effects to FDA at 1-800-FDA-1088. Where should I keep my medicine? This drug is given in a hospital or clinic and will not be stored at home. NOTE: This sheet is a summary. It may not cover all possible information. If you have questions about this medicine, talk to your doctor, pharmacist, or health care provider.  2018 Elsevier/Gold Standard (2007-10-12 15:13:04)  

## 2016-11-22 ENCOUNTER — Telehealth: Payer: Self-pay | Admitting: Cardiology

## 2016-11-22 NOTE — Telephone Encounter (Signed)
New Message  Pt call requesting to speak with RN. Pt states he is having heart pains. I asked pt if it was like chest pains. Pt states it felt like it was coming directly from his heart. He states he would not say it was chest pain. Pt states he has been feeling this pain for a while, but it has recently gotten worse. Pt states he has experienced some dizziness earlier in the week. Pt was made an appt on 10/2 with L. Kilroy. Please call back to discuss

## 2016-11-22 NOTE — Telephone Encounter (Signed)
Returned the phone call to the patient. He stated that he has been having intermittent pain "in the heart area" for the past few days. He did state that it has eased up as of last night and now he feels much better. He has taken 2 nitro in the past few days and has found relief with that. He stated that it comes on when he is sitting and at times he will feel dizzy. He has an appointment on Tuesday 10/2 with Kerin Ransom, [place].  He has been advised to go to the ED if the pain gets worse. He verbalized his understanding.

## 2016-11-26 ENCOUNTER — Ambulatory Visit (INDEPENDENT_AMBULATORY_CARE_PROVIDER_SITE_OTHER): Payer: Medicare Other | Admitting: Cardiology

## 2016-11-26 ENCOUNTER — Encounter: Payer: Self-pay | Admitting: Cardiology

## 2016-11-26 DIAGNOSIS — I10 Essential (primary) hypertension: Secondary | ICD-10-CM

## 2016-11-26 DIAGNOSIS — I5030 Unspecified diastolic (congestive) heart failure: Secondary | ICD-10-CM

## 2016-11-26 DIAGNOSIS — Z951 Presence of aortocoronary bypass graft: Secondary | ICD-10-CM | POA: Diagnosis not present

## 2016-11-26 DIAGNOSIS — N189 Chronic kidney disease, unspecified: Secondary | ICD-10-CM | POA: Insufficient documentation

## 2016-11-26 DIAGNOSIS — R001 Bradycardia, unspecified: Secondary | ICD-10-CM

## 2016-11-26 DIAGNOSIS — E785 Hyperlipidemia, unspecified: Secondary | ICD-10-CM

## 2016-11-26 DIAGNOSIS — I259 Chronic ischemic heart disease, unspecified: Secondary | ICD-10-CM

## 2016-11-26 DIAGNOSIS — I447 Left bundle-branch block, unspecified: Secondary | ICD-10-CM

## 2016-11-26 DIAGNOSIS — N183 Chronic kidney disease, stage 3 unspecified: Secondary | ICD-10-CM

## 2016-11-26 DIAGNOSIS — D693 Immune thrombocytopenic purpura: Secondary | ICD-10-CM

## 2016-11-26 DIAGNOSIS — I5033 Acute on chronic diastolic (congestive) heart failure: Secondary | ICD-10-CM | POA: Insufficient documentation

## 2016-11-26 MED ORDER — ISOSORBIDE MONONITRATE ER 30 MG PO TB24: 30.0000 mg | ORAL_TABLET | Freq: Every day | ORAL | 3 refills | Status: DC

## 2016-11-26 NOTE — Assessment & Plan Note (Signed)
Seen in the offcie today for chest pain with mixed ischemic and non ischemic features

## 2016-11-26 NOTE — Assessment & Plan Note (Signed)
Unable to take beta blocker

## 2016-11-26 NOTE — Assessment & Plan Note (Signed)
June 2018 after admission for CAP. Echo showed EF 60-65% with moderate LVH No CHF on exam

## 2016-11-26 NOTE — Progress Notes (Signed)
11/26/2016 Lawrence Warner   06-Aug-1918  [id number]  Primary Physician Clovia Cuff, MD Primary Cardiologist: Dr Percival Spanish  HPI:  83 y/o male with a history of CABG in 2000 in [place]. He saw Dr Percival Spanish to get established in Oct 2017. He had done well from a cardiac standpoint. He was admitted in Jan 2018 with CAP. He was admitted again 07/20/16-07/26/16 with CAP and acute respiratory failure. He came back to the ED 07/27/16 with respiratory failure felt to be secondary to diastolic CHF. Cardiology did not see during these admissions. He is in the office today after he called complaining of chest pain. The pt says he received a flu shot a few weeks ago and after that has had different chest pains. He has had Lt lateral sharp chest pain, "shooting " pain across his chest, and two episodes of "severe" chest pain for which he took NTG with some relief. He tells me he has felt much better of the past few days. He also noted that his PCP had stopped his Lopressor secondary to low HR "30's". His EKG shows NSR, LBBB, PACs.    Current Outpatient Prescriptions  Medication Sig Dispense Refill  . acetaminophen (TYLENOL) 500 MG tablet Take 500 mg by mouth 2 (two) times daily. May take an additional 500 mg two times a day as needed for pain    . eltrombopag (PROMACTA) 25 MG tablet Take 1 tablet (25 mg total) by mouth daily. Take on an empty stomach, 1 hour before a meal or 2 hours after. 30 tablet 1  . fluticasone (FLONASE) 50 MCG/ACT nasal spray Place 2 sprays into both nostrils 2 (two) times daily. For 7days 5 g 0  . hydroxypropyl methylcellulose / hypromellose (ISOPTO TEARS / GONIOVISC) 2.5 % ophthalmic solution Place 1 drop into both eyes 3 (three) times daily as needed for dry eyes.    . Multiple Vitamin (MULTIVITAMIN WITH MINERALS) TABS tablet Take 1 tablet by mouth daily.    Marland Kitchen omeprazole (PRILOSEC) 20 MG capsule Take 1 capsule (20 mg total) by mouth 2 (two) times daily before a meal.    . simvastatin  (ZOCOR) 20 MG tablet Take 20 mg by mouth at bedtime.     . isosorbide mononitrate (IMDUR) 30 MG 24 hr tablet Take 1 tablet (30 mg total) by mouth daily. 90 tablet 3  . nitroGLYCERIN (NITROSTAT) 0.4 MG SL tablet Place 1 tablet (0.4 mg total) under the tongue every 5 (five) minutes as needed for chest pain. 25 tablet 3   No current facility-administered medications for this visit.    Facility-Administered Medications Ordered in Other Visits  Medication Dose Route Frequency Provider Last Rate Last Dose  . cyanocobalamin ((VITAMIN B-12)) injection 1,000 mcg  1,000 mcg Subcutaneous Q30 days [name], MD   1,000 mcg at 03/13/16 1348  . cyanocobalamin ((VITAMIN B-12)) injection 1,000 mcg  1,000 mcg Subcutaneous Q30 days [name], MD   1,000 mcg at 05/14/16 1001  . cyanocobalamin ((VITAMIN B-12)) injection 1,000 mcg  1,000 mcg Subcutaneous Q30 days [name], MD   1,000 mcg at 09/10/16 1022  . romiPLOStim (NPLATE) injection 50 mcg  0.5 mcg/kg Subcutaneous Weekly [name], MD   50 mcg at 03/13/16 1349  . romiPLOStim (NPLATE) injection 50 mcg  50 mcg Subcutaneous Weekly [name], MD   50 mcg at 05/07/16 1106  . romiPLOStim (NPLATE) injection 50 mcg  0.5 mcg/kg Subcutaneous Weekly [name], MD   50  mcg at 05/14/16 1001  . romiPLOStim (NPLATE) injection 50 mcg  50 mcg Subcutaneous Weekly [name], MD   50 mcg at 08/13/16 0931  . romiPLOStim (NPLATE) injection 50 mcg  50 mcg Subcutaneous Weekly [name], MD   50 mcg at 08/27/16 2130  . romiPLOStim (NPLATE) injection 50 mcg  50 mcg Subcutaneous Weekly [name], MD   50 mcg at 09/03/16 1027  . romiPLOStim (NPLATE) injection 50 mcg  0.5 mcg/kg Subcutaneous Weekly [name], MD   50 mcg at 09/10/16 1023    Allergies  Allergen Reactions  . Alfuzosin Hcl Er Other (See Comments)    Confusion   . Penicillins Hives and Other (See Comments)    Has  patient had a PCN reaction causing immediate rash, facial/tongue/throat swelling, SOB or lightheadedness with hypotension: Yes Has patient had a PCN reaction causing severe rash involving mucus membranes or skin necrosis: Unknown Has patient had a PCN reaction that required hospitalization: Unknown Has patient had a PCN reaction occurring within the last 10 years: Unknown If all of the above answers are "NO", then may proceed with Cephalosporin use.     Past Medical History:  Diagnosis Date  . BPH (benign prostatic hypertrophy)   . Chronic heart failure ([place])   . Chronic renal insufficiency   . Coronary artery disease    CABG in 2000  . Diabetes type 2, controlled ([place])    Diet-controlled  . Diabetic neuropathy ([place])   . Dyslipidemia   . Hypertension   . ITP (idiopathic thrombocytopenic purpura)   . Osteoarthritis   . Pneumonia     Social History   Social History  . Marital status: Single    Spouse name: N/A  . Number of children: N/A  . Years of education: N/A   Occupational History  . Salesman    Social History Main Topics  . Smoking status: Former Smoker    Packs/day: 2.00    Years: 25.00  . Smokeless tobacco: Never Used  . Alcohol use No  . Drug use: No  . Sexual activity: Not Currently   Other Topics Concern  . Not on file   Social History Narrative    Two children.       Family History  Problem Relation Age of Onset  . Lung disease Father   . Cancer Brother      Review of Systems: General: negative for chills, fever, night sweats or weight changes.  Cardiovascular: negative for chest pain, dyspnea on exertion, edema, orthopnea, palpitations, paroxysmal nocturnal dyspnea or shortness of breath Dermatological: negative for rash Respiratory: negative for cough or wheezing Urologic: negative for hematuria Abdominal: negative for nausea, vomiting, diarrhea, bright red blood per rectum, melena, or hematemesis Neurologic: negative for visual changes,  syncope, or dizziness All other systems reviewed and are otherwise negative except as noted above.    Blood pressure 130/78, pulse 67, height 5\' 7"  (1.702 m), weight 211 lb 1.6 oz (95.8 kg).  General appearance: alert, cooperative, no distress and HOH Neck: no JVD Lungs: clear to auscultation bilaterally Heart: regular rate and rhythm Extremities: no edema Skin: Skin color, texture, turgor normal. No rashes or lesions Neurologic: Grossly normal  EKG NSR, PACs, LBBB-rate 67  ASSESSMENT AND PLAN:   Chest pain Seen in the offcie today for chest pain with mixed ischemic and non ischemic features  Hx of CABG CABG 8657 in [facility]   Diastolic congestive heart failure [facility]) June 2018 after admission for  CAP. Echo showed EF 60-65% with moderate LVH No CHF on exam  CRI (chronic renal insufficiency), stage 3 (moderate) (HCC) SCR 1.5, GFR 30's   Idiopathic thrombocytopenic purpura (HCC) [facility] Limbo He cannot take anti platelet agents including ASA  LBBB and bradycardia Unable to take beta blocker    PLAN  Mr Azbill is not a good candidate for any further testing as he would not be a candidate for cath secondary to H/O ITP and inability to take ASA, CRI, and advanced age. I'm not convinced his symptoms are cardiac though they could be. I recommended medical Rx and added Imdur. He'll f/u with dr Percival Spanish in a few months but will call if his symptoms change.   Kerin Ransom PA-C 11/26/2016 12:11 PM

## 2016-11-26 NOTE — Assessment & Plan Note (Signed)
CABG 2000 in [facility]

## 2016-11-26 NOTE — Assessment & Plan Note (Signed)
SCR 1.5, GFR 30's

## 2016-11-26 NOTE — Assessment & Plan Note (Signed)
[facility] Irene Limbo He cannot take anti platelet agents including ASA

## 2016-11-26 NOTE — Patient Instructions (Addendum)
Kerin Ransom, PA-C has recommended making the following medication changes: 1. START Imdur (Isosorbide) 30 mg - take 1 tablet daily  Your physician recommends that you schedule a follow-up appointment first available with Dr Percival Spanish.  If you need a refill on your cardiac medications before your next appointment, please call your pharmacy.

## 2016-11-28 ENCOUNTER — Telehealth: Payer: Self-pay

## 2016-11-28 NOTE — Telephone Encounter (Signed)
Told pt that we have received paperwork from the [place]. Spoke with [name] regarding this and what other information we may need from the patient. Only Medicare on file. Will need VA information from the pt at his next visit on 10/9. Appt note placed for registration to ask for pt's VA card. Called pt to let him know to bring it. He said he keeps it with him at all times.

## 2016-11-28 NOTE — Progress Notes (Signed)
Cardiology Office Note   Date:  12/01/2016   ID:  Lawrence Warner, DOB Jul 21, 1918, MRN [id number]  PCP:  Clovia Cuff, MD  Cardiologist:   Minus Breeding, MD    Chief Complaint  Patient presents with  . Shortness of Breath      History of Present Illness: Lawrence Warner is a 83 y.o. male who presents for evaluation of bradycardia and edema.   He was admitted 07/20/16-07/26/16 with CAP and acute respiratory failure. He came back to the ED 07/27/16 with respiratory failure felt to be secondary to diastolic CHF. He was in the office a couple ago  after he called complaining of chest pain.   This was thought not to be angina.   He had had his beta blocker stopped prior to this secondary to bradycardia.  He continues to have pain in his chest.  This is somewhat atypical. It runs from anterior chest around in his axilla and under her shoulder blade. There is some superficial soreness. He takes Tylenol for this. Doesn't seem better he takes a sublingual nitroglycerin distant. He's not describing any new shortness of breath, PND or orthopnea. He's had no weight gain or edema.   Past Medical History:  Diagnosis Date  . BPH (benign prostatic hypertrophy)   . Chronic heart failure ([place])   . Chronic renal insufficiency   . Coronary artery disease    CABG in 2000  . Diabetes type 2, controlled ([place])    Diet-controlled  . Diabetic neuropathy ([place])   . Dyslipidemia   . Hypertension   . ITP (idiopathic thrombocytopenic purpura)   . Osteoarthritis   . Pneumonia     Past Surgical History:  Procedure Laterality Date  . CHOLECYSTECTOMY    . CORONARY ARTERY BYPASS GRAFT  2000  . Right hip replacement  2014  . TONSILLECTOMY       Current Outpatient Prescriptions  Medication Sig Dispense Refill  . acetaminophen (TYLENOL) 500 MG tablet Take 500 mg by mouth 2 (two) times daily. May take an additional 500 mg two times a day as needed for pain    . eltrombopag (PROMACTA) 25 MG tablet  Take 1 tablet (25 mg total) by mouth daily. Take on an empty stomach, 1 hour before a meal or 2 hours after. 30 tablet 1  . fluticasone (FLONASE) 50 MCG/ACT nasal spray Place 2 sprays into both nostrils 2 (two) times daily. For 7days 5 g 0  . hydroxypropyl methylcellulose / hypromellose (ISOPTO TEARS / GONIOVISC) 2.5 % ophthalmic solution Place 1 drop into both eyes 3 (three) times daily as needed for dry eyes.    . isosorbide mononitrate (IMDUR) 60 MG 24 hr tablet Take 1 tablet (60 mg total) by mouth daily. 90 tablet 3  . Multiple Vitamin (MULTIVITAMIN WITH MINERALS) TABS tablet Take 1 tablet by mouth daily.    . nitroGLYCERIN (NITROSTAT) 0.4 MG SL tablet Place 1 tablet (0.4 mg total) under the tongue every 5 (five) minutes as needed for chest pain. 25 tablet 3  . omeprazole (PRILOSEC) 20 MG capsule Take 1 capsule (20 mg total) by mouth 2 (two) times daily before a meal.     No current facility-administered medications for this visit.    Facility-Administered Medications Ordered in Other Visits  Medication Dose Route Frequency Provider Last Rate Last Dose  . cyanocobalamin ((VITAMIN B-12)) injection 1,000 mcg  1,000 mcg Subcutaneous Q30 days [name], MD   1,000 mcg at 03/13/16 1348  . cyanocobalamin ((  VITAMIN B-12)) injection 1,000 mcg  1,000 mcg Subcutaneous Q30 days [name], MD   1,000 mcg at 05/14/16 1001  . cyanocobalamin ((VITAMIN B-12)) injection 1,000 mcg  1,000 mcg Subcutaneous Q30 days [name], MD   1,000 mcg at 09/10/16 1022  . romiPLOStim (NPLATE) injection 50 mcg  0.5 mcg/kg Subcutaneous Weekly [name], MD   50 mcg at 03/13/16 1349  . romiPLOStim (NPLATE) injection 50 mcg  50 mcg Subcutaneous Weekly [name], MD   50 mcg at 05/07/16 1106  . romiPLOStim (NPLATE) injection 50 mcg  0.5 mcg/kg Subcutaneous Weekly [name], MD   50 mcg at 05/14/16 1001  . romiPLOStim (NPLATE) injection 50 mcg  50 mcg Subcutaneous  Weekly [name], MD   50 mcg at 08/13/16 0931  . romiPLOStim (NPLATE) injection 50 mcg  50 mcg Subcutaneous Weekly [name], MD   50 mcg at 08/27/16 2229  . romiPLOStim (NPLATE) injection 50 mcg  50 mcg Subcutaneous Weekly [name], MD   50 mcg at 09/03/16 1027  . romiPLOStim (NPLATE) injection 50 mcg  0.5 mcg/kg Subcutaneous Weekly [name], MD   50 mcg at 09/10/16 1023    Allergies:   Alfuzosin hcl er and Penicillins    ROS:  Please see the history of present illness.   Otherwise, review of systems are positive for none.   All other systems are reviewed and negative.    PHYSICAL EXAM: VS:  BP (!) 152/44   Pulse 63   Ht 5\' 7"  (1.702 m)   Wt 215 lb 12.8 oz (97.9 kg)   BMI 33.80 kg/m  , BMI Body mass index is 33.8 kg/m.  GENERAL:  Well appearing, frail appearing.  NECK:  No jugular venous distention, waveform within normal limits, carotid upstroke brisk and symmetric, no bruits, no thyromegaly LUNGS:  Clear to auscultation bilaterally CHEST:  Unremarkable HEART:  PMI not displaced or sustained,S1 and S2 within normal limits, no S3, no S4, no clicks, no rubs, no murmurs ABD:  Flat, positive bowel sounds normal in frequency in pitch, no bruits, no rebound, no guarding, no midline pulsatile mass, no hepatomegaly, no splenomegaly EXT:  2 plus pulses throughout, no edema, no cyanosis no clubbing     EKG:  EKG is ordered today. The ekg ordered 10/05/15 demonstrates sinus bradycardia, rate 55, left axis deviation, left bundle branch block.  PACs.    Recent Labs: 02/28/2016: Magnesium 1.9 07/27/2016: B Natriuretic Peptide 173.6 10/22/2016: ALT 17; BUN 23.0; Creatinine 1.5; Potassium 4.6; Sodium 139 11/19/2016: HGB 13.1; Platelets 147    Lipid Panel    Component Value Date/Time   CHOL 100 05/23/2015 0919   TRIG 126 05/23/2015 0919   HDL 29 (L) 05/23/2015 0919   CHOLHDL 3.4 05/23/2015 0919   LDLCALC 46 05/23/2015 0919      Wt Readings  from Last 3 Encounters:  11/29/16 215 lb 12.8 oz (97.9 kg)  11/26/16 211 lb 1.6 oz (95.8 kg)  10/08/16 213 lb 8 oz (96.8 kg)      Other studies Reviewed: Additional studies/ records that were reviewed today include: None. Review of the above records demonstrates:    ASSESSMENT AND PLAN:  BRADYCARDIA:  This seems to be a problem since his beta blocker was stopped. No change in therapy is indicated.  EDEMA:   this is mild. No change in therapy is indicated. He takes when necessary Lasix.   CAD: I don't think that  the pain he is having his necessarily angina. However, I am going to give him an increase in his Imdur to 60 mg daily. Otherwise no change in therapy.   LEG CRAMPING:   Given all of the diffuse aching he's having I will stop his Zocor:    Current medicines are reviewed at length with the patient today.  The patient does not have concerns regarding medicines.  The following changes have been made:  As above  Labs/ tests ordered today include:   No orders of the defined types were placed in this encounter.    Disposition:   FU with Kerin Ransom, PAC in six months.    Signed, Minus Breeding, MD  12/01/2016 7:47 AM    [facility]

## 2016-11-29 ENCOUNTER — Encounter: Payer: Self-pay | Admitting: Cardiology

## 2016-11-29 ENCOUNTER — Ambulatory Visit (INDEPENDENT_AMBULATORY_CARE_PROVIDER_SITE_OTHER): Payer: Medicare Other | Admitting: Cardiology

## 2016-11-29 VITALS — BP 152/44 | HR 63 | Ht 67.0 in | Wt 215.8 lb

## 2016-11-29 DIAGNOSIS — R001 Bradycardia, unspecified: Secondary | ICD-10-CM | POA: Diagnosis not present

## 2016-11-29 DIAGNOSIS — I2510 Atherosclerotic heart disease of native coronary artery without angina pectoris: Secondary | ICD-10-CM | POA: Diagnosis not present

## 2016-11-29 DIAGNOSIS — R252 Cramp and spasm: Secondary | ICD-10-CM | POA: Diagnosis not present

## 2016-11-29 DIAGNOSIS — M7989 Other specified soft tissue disorders: Secondary | ICD-10-CM

## 2016-11-29 MED ORDER — ISOSORBIDE MONONITRATE ER 60 MG PO TB24: 60.0000 mg | ORAL_TABLET | Freq: Every day | ORAL | 3 refills | Status: DC

## 2016-11-29 NOTE — Patient Instructions (Signed)
Medication Instructions:  STOP- Simvastatin INCREASE- Isosorbide 60 mg daily  If you need a refill on your cardiac medications before your next appointment, please call your pharmacy.  Labwork: None Ordered   Testing/Procedures: None Ordered  Follow-Up: Your physician wants you to follow-up in: 3 Months with Kerin Ransom.    Thank you for choosing [facility] at [facility]!!

## 2016-12-01 ENCOUNTER — Encounter: Payer: Self-pay | Admitting: Cardiology

## 2016-12-02 NOTE — Progress Notes (Signed)
.    Hematology oncology clinic note  Date of service: 12/03/16   Patient Care Team: Clovia Cuff, MD as PCP - General (Internal Medicine) Rico Sheehan (Hematology and Oncology)  CHIEF COMPLAINTS/PURPOSE OF CONSULTATION: Follow-up for ITP   Diagnosis: Idiopathic thrombocytopenic purpura   Current treatment: Nplate q2 weekly to maintain reasonable platelet counts >50K.  Requirement have ranged from 3mcg/kg to 32mcg/kg. Recently stable counts with 0.5-1 mcg/kg qweekly  Previous treatment: Steroids, IVIG.  HISTORY OF PRESENTING ILLNESS:  please see my previous clinic note from 09/28/2014 for details of initial presentation and course of treatment.  Interval history   Pt presents to the office today for f/u of his ITP . Of note sine the patients last visit, his platelet count remained stable at 163k as of 12/03/2016. Marland Kitchen He reports that he is doing well overall but states that he is having chest pains which he believes is a reaction to the flu shot.He is being evaluated by his cardiologist MD Minus Breeding for this and was prescribed Imdur.  He reports pain in his left chest area. He has been prescribed imdur 60mg  since 1 week ago. He is expressing that he would okay with switching to Promacta if he can get it here and his co-pay is <10$ per month.  Upon discussion with our oral rx pharmacist.  On review of systems, pt reports mildly resolved, left lower CP on and off. He denies abdominal pain.     MEDICAL HISTORY:  Past Medical History:  Diagnosis Date  . BPH (benign prostatic hypertrophy)   . Chronic heart failure ([place])   . Chronic renal insufficiency   . Coronary artery disease    CABG in 2000  . Diabetes type 2, controlled ([place])    Diet-controlled  . Diabetic neuropathy ([place])   . Dyslipidemia   . Hypertension   . ITP (idiopathic thrombocytopenic purpura)   . Osteoarthritis   . Pneumonia     SURGICAL HISTORY: Past Surgical History:  Procedure Laterality Date  .  CHOLECYSTECTOMY    . CORONARY ARTERY BYPASS GRAFT  2000  . Right hip replacement  2014  . TONSILLECTOMY      SOCIAL HISTORY: Social History   Social History  . Marital status: Single    Spouse name: N/A  . Number of children: N/A  . Years of education: N/A   Occupational History  . Salesman    Social History Main Topics  . Smoking status: Former Smoker    Packs/day: 2.00    Years: 25.00  . Smokeless tobacco: Never Used  . Alcohol use No  . Drug use: No  . Sexual activity: Not Currently   Other Topics Concern  . Not on file   Social History Narrative    Two children.     He is living in [place] which is an assisted living facility. One of his son is also moving to [facility] and has a Designer, jewellery in religious studies. Patient notes that his other son is a Systems analyst with multiple awards.  FAMILY HISTORY: Family History  Problem Relation Age of Onset  . Lung disease Father   . Cancer Brother     ALLERGIES:  is allergic to alfuzosin hcl er and penicillins.  MEDICATIONS:  Current Outpatient Prescriptions  Medication Sig Dispense Refill  . acetaminophen (TYLENOL) 500 MG tablet Take 500 mg by mouth 2 (two) times daily. May take an additional 500 mg two times a day as needed for pain    .  fluticasone (FLONASE) 50 MCG/ACT nasal spray Place 2 sprays into both nostrils 2 (two) times daily. For 7days 5 g 0  . hydroxypropyl methylcellulose / hypromellose (ISOPTO TEARS / GONIOVISC) 2.5 % ophthalmic solution Place 1 drop into both eyes 3 (three) times daily as needed for dry eyes.    . isosorbide mononitrate (IMDUR) 60 MG 24 hr tablet Take 1 tablet (60 mg total) by mouth daily. 90 tablet 3  . Multiple Vitamin (MULTIVITAMIN WITH MINERALS) TABS tablet Take 1 tablet by mouth daily.    Marland Kitchen omeprazole (PRILOSEC) 20 MG capsule Take 1 capsule (20 mg total) by mouth 2 (two) times daily before a meal.    . nitroGLYCERIN (NITROSTAT) 0.4 MG SL tablet Place 1 tablet (0.4  mg total) under the tongue every 5 (five) minutes as needed for chest pain. 25 tablet 3   No current facility-administered medications for this visit.    Facility-Administered Medications Ordered in Other Visits  Medication Dose Route Frequency Provider Last Rate Last Dose  . cyanocobalamin ((VITAMIN B-12)) injection 1,000 mcg  1,000 mcg Subcutaneous Q30 days [name], MD   1,000 mcg at 03/13/16 1348  . cyanocobalamin ((VITAMIN B-12)) injection 1,000 mcg  1,000 mcg Subcutaneous Q30 days [name], MD   1,000 mcg at 05/14/16 1001  . cyanocobalamin ((VITAMIN B-12)) injection 1,000 mcg  1,000 mcg Subcutaneous Q30 days [name], MD   1,000 mcg at 09/10/16 1022  . cyanocobalamin ((VITAMIN B-12)) injection 1,000 mcg  1,000 mcg Subcutaneous Q30 days [name], MD   1,000 mcg at 12/03/16 1038  . romiPLOStim (NPLATE) injection 50 mcg  0.5 mcg/kg Subcutaneous Weekly [name], MD   50 mcg at 03/13/16 1349  . romiPLOStim (NPLATE) injection 50 mcg  50 mcg Subcutaneous Weekly [name], MD   50 mcg at 05/07/16 1106  . romiPLOStim (NPLATE) injection 50 mcg  0.5 mcg/kg Subcutaneous Weekly [name], MD   50 mcg at 05/14/16 1001  . romiPLOStim (NPLATE) injection 50 mcg  50 mcg Subcutaneous Weekly [name], MD   50 mcg at 08/13/16 0931  . romiPLOStim (NPLATE) injection 50 mcg  50 mcg Subcutaneous Weekly [name], MD   50 mcg at 08/27/16 6237  . romiPLOStim (NPLATE) injection 50 mcg  50 mcg Subcutaneous Weekly [name], MD   50 mcg at 09/03/16 1027  . romiPLOStim (NPLATE) injection 50 mcg  0.5 mcg/kg Subcutaneous Weekly [name], MD   50 mcg at 09/10/16 1023  . romiPLOStim (NPLATE) injection 50 mcg  0.5 mcg/kg Subcutaneous Weekly [name], MD   50 mcg at 12/03/16 1038   Romiplostim 5 mcg/kg weekly   REVIEW OF SYSTEMS:   10 point review of system is negative except as noted  above  PHYSICAL EXAMINATION: ECOG PERFORMANCE STATUS: 1 - Symptomatic but completely ambulatory  Vitals:   12/03/16 0931  BP: (!) 135/53  Pulse: 61  Resp: 18  Temp: 97.9 F (36.6 C)  SpO2: 96%   Filed Weights   12/03/16 0931  Weight: 214 lb 11.2 oz (97.4 kg)   GENERAL: Elderly gentleman in no acute distress:alert and comfortable SKIN: skin color, texture, turgor are normal, no rashes or significant lesions EYES: normal, conjunctiva are pink and non-injected, sclera clear OROPHARYNX:no exudate, no erythema and lips, buccal mucosa, and tongue normal  NECK: supple, thyroid normal size, non-tender, without nodularity LYMPH:  no palpable lymphadenopathy in the cervical, axillary or inguinal LUNGS: Air  entry bilaterally equal, No rales no rhonchi HEART: regular rate & rhythm and 2 x 6 systolic murmur over aortic area, and no lower extremity edema ABDOMEN:abdomen soft, non-tender and normal bowel sounds PSYCH: alert & oriented x 3 with fluent speech NEURO: no focal motor/sensory deficits  LABORATORY DATA:  . CBC Latest Ref Rng & Units 12/03/2016 11/19/2016 11/05/2016  WBC 4.0 - 10.3 10e3/uL 5.1 4.8 6.2  Hemoglobin 13.0 - 17.1 g/dL 12.3(L) 13.1 12.4(L)  Hematocrit 38.4 - 49.9 % 37.6(L) 40.0 37.6(L)  Platelets 140 - 400 10e3/uL 163 147 150    CMP Latest Ref Rng & Units 12/03/2016 10/22/2016 09/24/2016  Glucose 70 - 140 mg/dl 169(H) 168(H) 177(H)  BUN 7.0 - 26.0 mg/dL 31.8(H) 23.0 24.9  Creatinine 0.7 - 1.3 mg/dL 1.7(H) 1.5(H) 1.5(H)  Sodium 136 - 145 mEq/L 141 139 139  Potassium 3.5 - 5.1 mEq/L 4.5 4.6 5.0  Chloride 101 - 111 mmol/L - - -  CO2 22 - 29 mEq/L 24 26 24   Calcium 8.4 - 10.4 mg/dL 9.6 9.7 9.7  Total Protein 6.4 - 8.3 g/dL 6.5 6.5 6.8  Total Bilirubin 0.20 - 1.20 mg/dL 0.36 0.63 0.39  Alkaline Phos 40 - 150 U/L 76 64 78  AST 5 - 34 U/L 20 21 24   ALT 0 - 55 U/L 16 17 21     ASSESSMENT & PLAN:   83 year old Caucasian male with multiple medical comorbidities with  #1  Chronic ITP since 1989. Has previously been responsive to steroids and has received IVIG on one occasion. He has been maintained on 3-74mcg/kg weekly of Nplate since mid 2831 initially with Dr. Arvin Collard at [facility] and then with Dr. Jimmie Molly at [facility] in [place].  No issues with bleeding . Ultrasound abdomen showed normal spleen size SPEP- no obvious monoclonal protein. IFE showed possibility of an restrictive bands in the IgG and Lambda lanes. Patient's platelet counts have remained remarkably stable at Romiplostim doses of  0.5 mcg/kg every 2 weeks   #2 H/o B12 deficiency Plan  -awaiting Promacta availability/approval through the [place] system - patient was seen by a hematologist in the [place] system. He would have to follow-up with them to get Promacta there. He would not want a follow-up visit at this time. -Continue his scheduled dose of Nplate at 5.1VOH/YW every other week and adjust dose to maintain platelet counts >50k. If further downtrend in platelets might need to adjust dose/go back to weekly shots. -continue B12 replacement monthly for B12 deficiency. -Avoid NSAIDs -labs q2weeks  #2  Coronary artery disease status post CABG in year 2000. Not on aspirin due to his ITP . BNP was elevated. ECHO today shows mildly reduced systolic function and LVH  Mild intermittent chest pain. No shortness of breath. Plan - was evaluated for his chronic intermittent CP by cardiology - Dr Minus Breeding and was started on Imdur.  #3 chronic kidney disease  -Continue to management as per primary care physician  PLAN: Continue Nplate q2weeks Check with pharmacist to determine if Promacta will be covered for the patient.   RTC with Dr Irene Limbo in 2 months  Total time spent 20 minutes more than 50% time on direct patient contact counseling and coordination of care.    Sullivan Lone MD MS Hematology/Oncology Physician [facility]  (Office):       [phone] (Work cell):   [phone] (Fax):           [phone]  This document serves as a  record of services personally performed by Sullivan Lone, MD. It was created on her behalf by Alean Rinne, a trained medical scribe. The creation of this record is based on the scribe's personal observations and the provider's statements to them. This document has been checked and approved by the attending provider.

## 2016-12-03 ENCOUNTER — Telehealth: Payer: Self-pay

## 2016-12-03 ENCOUNTER — Other Ambulatory Visit: Payer: Medicare Other

## 2016-12-03 ENCOUNTER — Encounter: Payer: Self-pay | Admitting: Hematology

## 2016-12-03 ENCOUNTER — Other Ambulatory Visit (HOSPITAL_BASED_OUTPATIENT_CLINIC_OR_DEPARTMENT_OTHER): Payer: Medicare Other

## 2016-12-03 ENCOUNTER — Ambulatory Visit (HOSPITAL_BASED_OUTPATIENT_CLINIC_OR_DEPARTMENT_OTHER): Payer: Medicare Other

## 2016-12-03 ENCOUNTER — Ambulatory Visit (HOSPITAL_BASED_OUTPATIENT_CLINIC_OR_DEPARTMENT_OTHER): Payer: Medicare Other | Admitting: Hematology

## 2016-12-03 VITALS — BP 135/53 | HR 61 | Temp 97.9°F | Resp 18 | Ht 67.0 in | Wt 214.7 lb

## 2016-12-03 DIAGNOSIS — Z95828 Presence of other vascular implants and grafts: Secondary | ICD-10-CM

## 2016-12-03 DIAGNOSIS — D693 Immune thrombocytopenic purpura: Secondary | ICD-10-CM

## 2016-12-03 DIAGNOSIS — E538 Deficiency of other specified B group vitamins: Secondary | ICD-10-CM

## 2016-12-03 LAB — CBC & DIFF AND RETIC
BASO%: 0.2 % (ref 0.0–2.0)
Basophils Absolute: 0 10*3/uL (ref 0.0–0.1)
EOS%: 1.4 % (ref 0.0–7.0)
Eosinophils Absolute: 0.1 10*3/uL (ref 0.0–0.5)
HEMATOCRIT: 37.6 % — AB (ref 38.4–49.9)
HGB: 12.3 g/dL — ABNORMAL LOW (ref 13.0–17.1)
Immature Retic Fract: 11.9 % — ABNORMAL HIGH (ref 3.00–10.60)
LYMPH#: 1.2 10*3/uL (ref 0.9–3.3)
LYMPH%: 23.6 % (ref 14.0–49.0)
MCH: 31.2 pg (ref 27.2–33.4)
MCHC: 32.7 g/dL (ref 32.0–36.0)
MCV: 95.4 fL (ref 79.3–98.0)
MONO#: 0.8 10*3/uL (ref 0.1–0.9)
MONO%: 15 % — AB (ref 0.0–14.0)
NEUT#: 3.1 10*3/uL (ref 1.5–6.5)
NEUT%: 59.8 % (ref 39.0–75.0)
Platelets: 163 10*3/uL (ref 140–400)
RBC: 3.94 10*6/uL — AB (ref 4.20–5.82)
RDW: 12.7 % (ref 11.0–14.6)
Retic %: 1.42 % (ref 0.80–1.80)
Retic Ct Abs: 55.95 10*3/uL (ref 34.80–93.90)
WBC: 5.1 10*3/uL (ref 4.0–10.3)

## 2016-12-03 LAB — COMPREHENSIVE METABOLIC PANEL
ALT: 16 U/L (ref 0–55)
ANION GAP: 8 meq/L (ref 3–11)
AST: 20 U/L (ref 5–34)
Albumin: 3.3 g/dL — ABNORMAL LOW (ref 3.5–5.0)
Alkaline Phosphatase: 76 U/L (ref 40–150)
BUN: 31.8 mg/dL — ABNORMAL HIGH (ref 7.0–26.0)
CALCIUM: 9.6 mg/dL (ref 8.4–10.4)
CHLORIDE: 110 meq/L — AB (ref 98–109)
CO2: 24 meq/L (ref 22–29)
CREATININE: 1.7 mg/dL — AB (ref 0.7–1.3)
EGFR: 34 mL/min/{1.73_m2} — AB (ref >90)
Glucose: 169 mg/dl — ABNORMAL HIGH (ref 70–140)
Potassium: 4.5 mEq/L (ref 3.5–5.1)
Sodium: 141 mEq/L (ref 136–145)
Total Bilirubin: 0.36 mg/dL (ref 0.20–1.20)
Total Protein: 6.5 g/dL (ref 6.4–8.3)

## 2016-12-03 MED ORDER — CYANOCOBALAMIN 1000 MCG/ML IJ SOLN
1000.0000 ug | INTRAMUSCULAR | Status: DC
  Administered 2016-12-03: 1000 ug via SUBCUTANEOUS

## 2016-12-03 MED ORDER — ROMIPLOSTIM 250 MCG ~~LOC~~ SOLR
0.5000 ug/kg | SUBCUTANEOUS | Status: AC
  Administered 2016-12-03: 50 ug via SUBCUTANEOUS
  Filled 2016-12-03: qty 0.1

## 2016-12-03 NOTE — Telephone Encounter (Signed)
Although paperwork has now been received confirming that Dr. Irene Limbo is okay to order promacta and cost of drug will be paid for by the [place] it cannot be processed through our pharmacy. The medication would have to be processed through the [place]. Discussed this with the patient and he wanted it to work out, but since it seems he is going to be directed back to the [place] regardless of documentation, he is okay to continue coming to see Dr. Irene Limbo and receiving Nplate. Dr. Irene Limbo notified of decision as discussed between this RN and pt.

## 2016-12-03 NOTE — Patient Instructions (Signed)
Romiplostim injection What is this medicine? ROMIPLOSTIM ([place] mi PLOE stim) helps your body make more platelets. This medicine is used to treat low platelets caused by chronic idiopathic thrombocytopenic purpura (ITP). This medicine may be used for other purposes; ask your health care provider or pharmacist if you have questions. What should I tell my health care provider before I take this medicine? They need to know if you have any of these conditions: -cancer or myelodysplastic syndrome -low blood counts, like low white cell, platelet, or red cell counts -take medicines that treat or prevent blood clots -an unusual or allergic reaction to romiplostim, mannitol, other medicines, foods, dyes, or preservatives -pregnant or trying to get pregnant -breast-feeding How should I use this medicine? This medicine is for injection under the skin. It is given by a health care professional in a hospital or clinic setting. A special MedGuide will be given to you before your injection. Read this information carefully each time. Talk to your pediatrician regarding the use of this medicine in children. Special care may be needed. Overdosage: If you think you have taken too much of this medicine contact a poison control center or emergency room at once. NOTE: This medicine is only for you. Do not share this medicine with others. What if I miss a dose? It is important not to miss your dose. Call your doctor or health care professional if you are unable to keep an appointment. What may interact with this medicine? Interactions are not expected. This list may not describe all possible interactions. Give your health care provider a list of all the medicines, herbs, non-prescription drugs, or dietary supplements you use. Also tell them if you smoke, drink alcohol, or use illegal drugs. Some items may interact with your medicine. What should I watch for while using this medicine? Your condition will be monitored  carefully while you are receiving this medicine. Visit your prescriber or health care professional for regular checks on your progress and for the needed blood tests. It is important to keep all appointments. What side effects may I notice from receiving this medicine? Side effects that you should report to your doctor or health care professional as soon as possible: -allergic reactions like skin rash, itching or hives, swelling of the face, lips, or tongue -shortness of breath, chest pain, swelling in a leg -unusual bleeding or bruising Side effects that usually do not require medical attention (report to your doctor or health care professional if they continue or are bothersome): -dizziness -headache -muscle aches -pain in arms and legs -stomach pain -trouble sleeping This list may not describe all possible side effects. Call your doctor for medical advice about side effects. You may report side effects to FDA at 1-800-FDA-1088. Where should I keep my medicine? This drug is given in a hospital or clinic and will not be stored at home. NOTE: This sheet is a summary. It may not cover all possible information. If you have questions about this medicine, talk to your doctor, pharmacist, or health care provider.    2016, Elsevier/Gold Standard. (2007-10-12 15:13:04) Cyanocobalamin, Vitamin B12 injection What is this medicine? CYANOCOBALAMIN (sye an oh koe BAL a min) is a man made form of vitamin B12. Vitamin B12 is used in the growth of healthy blood cells, nerve cells, and proteins in the body. It also helps with the metabolism of fats and carbohydrates. This medicine is used to treat people who can not absorb vitamin B12. This medicine may be used for other   purposes; ask your health care provider or pharmacist if you have questions. What should I tell my health care provider before I take this medicine? They need to know if you have any of these conditions: -kidney disease -Leber's  disease -megaloblastic anemia -an unusual or allergic reaction to cyanocobalamin, cobalt, other medicines, foods, dyes, or preservatives -pregnant or trying to get pregnant -breast-feeding How should I use this medicine? This medicine is injected into a muscle or deeply under the skin. It is usually given by a health care professional in a clinic or doctor's office. However, your doctor may teach you how to inject yourself. Follow all instructions. Talk to your pediatrician regarding the use of this medicine in children. Special care may be needed. Overdosage: If you think you have taken too much of this medicine contact a poison control center or emergency room at once. NOTE: This medicine is only for you. Do not share this medicine with others. What if I miss a dose? If you are given your dose at a clinic or doctor's office, call to reschedule your appointment. If you give your own injections and you miss a dose, take it as soon as you can. If it is almost time for your next dose, take only that dose. Do not take double or extra doses. What may interact with this medicine? -colchicine -heavy alcohol intake This list may not describe all possible interactions. Give your health care provider a list of all the medicines, herbs, non-prescription drugs, or dietary supplements you use. Also tell them if you smoke, drink alcohol, or use illegal drugs. Some items may interact with your medicine. What should I watch for while using this medicine? Visit your doctor or health care professional regularly. You may need blood work done while you are taking this medicine. You may need to follow a special diet. Talk to your doctor. Limit your alcohol intake and avoid smoking to get the best benefit. What side effects may I notice from receiving this medicine? Side effects that you should report to your doctor or health care professional as soon as possible: -allergic reactions like skin rash, itching or  hives, swelling of the face, lips, or tongue -blue tint to skin -chest tightness, pain -difficulty breathing, wheezing -dizziness -red, swollen painful area on the leg Side effects that usually do not require medical attention (report to your doctor or health care professional if they continue or are bothersome): -diarrhea -headache This list may not describe all possible side effects. Call your doctor for medical advice about side effects. You may report side effects to FDA at 1-800-FDA-1088. Where should I keep my medicine? Keep out of the reach of children. Store at room temperature between 15 and 30 degrees C (59 and 85 degrees F). Protect from light. Throw away any unused medicine after the expiration date. NOTE: This sheet is a summary. It may not cover all possible information. If you have questions about this medicine, talk to your doctor, pharmacist, or health care provider.    2016, Elsevier/Gold Standard. (2007-05-25 22:10:20)  

## 2016-12-03 NOTE — Telephone Encounter (Signed)
Scheduled parient upcoming appointment. Per 10/9 los

## 2016-12-03 NOTE — Addendum Note (Signed)
Addended by: Debria Garret L on: 12/03/2016 08:30 AM   Modules accepted: Orders

## 2016-12-05 ENCOUNTER — Telehealth: Payer: Self-pay | Admitting: Hematology

## 2016-12-05 NOTE — Telephone Encounter (Signed)
Spoke with patient regarding appts that were scheduled per 10/9 los.

## 2016-12-09 ENCOUNTER — Ambulatory Visit: Payer: Medicare Other | Admitting: Hematology

## 2016-12-14 DIAGNOSIS — E118 Type 2 diabetes mellitus with unspecified complications: Secondary | ICD-10-CM | POA: Diagnosis not present

## 2016-12-14 DIAGNOSIS — M7989 Other specified soft tissue disorders: Secondary | ICD-10-CM | POA: Diagnosis not present

## 2016-12-14 DIAGNOSIS — I5022 Chronic systolic (congestive) heart failure: Secondary | ICD-10-CM | POA: Diagnosis not present

## 2016-12-14 DIAGNOSIS — I10 Essential (primary) hypertension: Secondary | ICD-10-CM | POA: Diagnosis not present

## 2016-12-17 ENCOUNTER — Ambulatory Visit (HOSPITAL_BASED_OUTPATIENT_CLINIC_OR_DEPARTMENT_OTHER): Payer: Medicare Other

## 2016-12-17 ENCOUNTER — Other Ambulatory Visit (HOSPITAL_BASED_OUTPATIENT_CLINIC_OR_DEPARTMENT_OTHER): Payer: Medicare Other

## 2016-12-17 VITALS — BP 142/60 | HR 65 | Temp 98.0°F | Resp 18

## 2016-12-17 DIAGNOSIS — D693 Immune thrombocytopenic purpura: Secondary | ICD-10-CM

## 2016-12-17 DIAGNOSIS — Z95828 Presence of other vascular implants and grafts: Secondary | ICD-10-CM

## 2016-12-17 LAB — CBC & DIFF AND RETIC
BASO%: 0.2 % (ref 0.0–2.0)
BASOS ABS: 0 10*3/uL (ref 0.0–0.1)
EOS ABS: 0.1 10*3/uL (ref 0.0–0.5)
EOS%: 2.2 % (ref 0.0–7.0)
HEMATOCRIT: 41 % (ref 38.4–49.9)
HEMOGLOBIN: 13.5 g/dL (ref 13.0–17.1)
IMMATURE RETIC FRACT: 6.4 % (ref 3.00–10.60)
LYMPH%: 19.8 % (ref 14.0–49.0)
MCH: 30.8 pg (ref 27.2–33.4)
MCHC: 32.9 g/dL (ref 32.0–36.0)
MCV: 93.6 fL (ref 79.3–98.0)
MONO#: 0.8 10*3/uL (ref 0.1–0.9)
MONO%: 13.6 % (ref 0.0–14.0)
NEUT#: 3.8 10*3/uL (ref 1.5–6.5)
NEUT%: 64.2 % (ref 39.0–75.0)
Platelets: 139 10*3/uL — ABNORMAL LOW (ref 140–400)
RBC: 4.38 10*6/uL (ref 4.20–5.82)
RDW: 12.6 % (ref 11.0–14.6)
RETIC %: 1.29 % (ref 0.80–1.80)
Retic Ct Abs: 56.5 10*3/uL (ref 34.80–93.90)
WBC: 6 10*3/uL (ref 4.0–10.3)
lymph#: 1.2 10*3/uL (ref 0.9–3.3)
nRBC: 0 % (ref 0–0)

## 2016-12-17 MED ORDER — ROMIPLOSTIM 250 MCG ~~LOC~~ SOLR
50.0000 ug | SUBCUTANEOUS | Status: DC
  Administered 2016-12-17: 50 ug via SUBCUTANEOUS
  Filled 2016-12-17: qty 0.1

## 2016-12-19 DIAGNOSIS — L570 Actinic keratosis: Secondary | ICD-10-CM | POA: Diagnosis not present

## 2016-12-20 ENCOUNTER — Telehealth: Payer: Self-pay

## 2016-12-20 NOTE — Telephone Encounter (Signed)
Returning patient request to call back. Plan for pt to have eyelid surgery on 11/7. Pt concerned that his platelets have been declining and the last lab work showed plt 136. Per Dr. Irene Limbo, pt okay to come in 11/5 or 11/6 for his injection prior to surgery on 11/7. Pt confirmed 11/5 would be okay with him. Scheduling message sent to call the patient and change appt from 11/6 to 11/5: labs and injection.

## 2016-12-26 DIAGNOSIS — M6281 Muscle weakness (generalized): Secondary | ICD-10-CM | POA: Diagnosis not present

## 2016-12-26 DIAGNOSIS — I10 Essential (primary) hypertension: Secondary | ICD-10-CM | POA: Diagnosis not present

## 2016-12-26 DIAGNOSIS — I5022 Chronic systolic (congestive) heart failure: Secondary | ICD-10-CM | POA: Diagnosis not present

## 2016-12-26 DIAGNOSIS — J020 Streptococcal pharyngitis: Secondary | ICD-10-CM | POA: Diagnosis not present

## 2016-12-30 ENCOUNTER — Other Ambulatory Visit (HOSPITAL_BASED_OUTPATIENT_CLINIC_OR_DEPARTMENT_OTHER): Payer: Medicare Other

## 2016-12-30 ENCOUNTER — Ambulatory Visit (HOSPITAL_BASED_OUTPATIENT_CLINIC_OR_DEPARTMENT_OTHER): Payer: Medicare Other

## 2016-12-30 VITALS — BP 143/68 | HR 67 | Temp 97.6°F | Resp 20

## 2016-12-30 DIAGNOSIS — E538 Deficiency of other specified B group vitamins: Secondary | ICD-10-CM

## 2016-12-30 DIAGNOSIS — D693 Immune thrombocytopenic purpura: Secondary | ICD-10-CM

## 2016-12-30 DIAGNOSIS — Z95828 Presence of other vascular implants and grafts: Secondary | ICD-10-CM

## 2016-12-30 LAB — CBC & DIFF AND RETIC
BASO%: 0.2 % (ref 0.0–2.0)
Basophils Absolute: 0 10*3/uL (ref 0.0–0.1)
EOS%: 2.9 % (ref 0.0–7.0)
Eosinophils Absolute: 0.2 10*3/uL (ref 0.0–0.5)
HCT: 38.5 % (ref 38.4–49.9)
HGB: 12.4 g/dL — ABNORMAL LOW (ref 13.0–17.1)
Immature Retic Fract: 10.6 % (ref 3.00–10.60)
LYMPH#: 1.3 10*3/uL (ref 0.9–3.3)
LYMPH%: 20.2 % (ref 14.0–49.0)
MCH: 30.2 pg (ref 27.2–33.4)
MCHC: 32.2 g/dL (ref 32.0–36.0)
MCV: 93.9 fL (ref 79.3–98.0)
MONO#: 0.7 10*3/uL (ref 0.1–0.9)
MONO%: 11.5 % (ref 0.0–14.0)
NEUT#: 4.1 10*3/uL (ref 1.5–6.5)
NEUT%: 65.2 % (ref 39.0–75.0)
PLATELETS: 185 10*3/uL (ref 140–400)
RBC: 4.1 10*6/uL — AB (ref 4.20–5.82)
RDW: 12.5 % (ref 11.0–14.6)
Retic %: 1.27 % (ref 0.80–1.80)
Retic Ct Abs: 52.07 10*3/uL (ref 34.80–93.90)
WBC: 6.2 10*3/uL (ref 4.0–10.3)

## 2016-12-30 MED ORDER — ROMIPLOSTIM 250 MCG ~~LOC~~ SOLR
50.0000 ug | SUBCUTANEOUS | Status: DC
  Administered 2016-12-30: 50 ug via SUBCUTANEOUS
  Filled 2016-12-30: qty 0.1

## 2016-12-30 MED ORDER — CYANOCOBALAMIN 1000 MCG/ML IJ SOLN
1000.0000 ug | INTRAMUSCULAR | Status: DC
  Administered 2016-12-30: 1000 ug via SUBCUTANEOUS

## 2016-12-30 NOTE — Patient Instructions (Signed)
Romiplostim injection What is this medicine? ROMIPLOSTIM ([place] mi PLOE stim) helps your body make more platelets. This medicine is used to treat low platelets caused by chronic idiopathic thrombocytopenic purpura (ITP). This medicine may be used for other purposes; ask your health care provider or pharmacist if you have questions. What should I tell my health care provider before I take this medicine? They need to know if you have any of these conditions: -cancer or myelodysplastic syndrome -low blood counts, like low white cell, platelet, or red cell counts -take medicines that treat or prevent blood clots -an unusual or allergic reaction to romiplostim, mannitol, other medicines, foods, dyes, or preservatives -pregnant or trying to get pregnant -breast-feeding How should I use this medicine? This medicine is for injection under the skin. It is given by a health care professional in a hospital or clinic setting. A special MedGuide will be given to you before your injection. Read this information carefully each time. Talk to your pediatrician regarding the use of this medicine in children. Special care may be needed. Overdosage: If you think you have taken too much of this medicine contact a poison control center or emergency room at once. NOTE: This medicine is only for you. Do not share this medicine with others. What if I miss a dose? It is important not to miss your dose. Call your doctor or health care professional if you are unable to keep an appointment. What may interact with this medicine? Interactions are not expected. This list may not describe all possible interactions. Give your health care provider a list of all the medicines, herbs, non-prescription drugs, or dietary supplements you use. Also tell them if you smoke, drink alcohol, or use illegal drugs. Some items may interact with your medicine. What should I watch for while using this medicine? Your condition will be monitored  carefully while you are receiving this medicine. Visit your prescriber or health care professional for regular checks on your progress and for the needed blood tests. It is important to keep all appointments. What side effects may I notice from receiving this medicine? Side effects that you should report to your doctor or health care professional as soon as possible: -allergic reactions like skin rash, itching or hives, swelling of the face, lips, or tongue -shortness of breath, chest pain, swelling in a leg -unusual bleeding or bruising Side effects that usually do not require medical attention (report to your doctor or health care professional if they continue or are bothersome): -dizziness -headache -muscle aches -pain in arms and legs -stomach pain -trouble sleeping This list may not describe all possible side effects. Call your doctor for medical advice about side effects. You may report side effects to FDA at 1-800-FDA-1088. Where should I keep my medicine? This drug is given in a hospital or clinic and will not be stored at home. NOTE: This sheet is a summary. It may not cover all possible information. If you have questions about this medicine, talk to your doctor, pharmacist, or health care provider.    2016, Elsevier/Gold Standard. (2007-10-12 15:13:04) Cyanocobalamin, Vitamin B12 injection What is this medicine? CYANOCOBALAMIN (sye an oh koe BAL a min) is a man made form of vitamin B12. Vitamin B12 is used in the growth of healthy blood cells, nerve cells, and proteins in the body. It also helps with the metabolism of fats and carbohydrates. This medicine is used to treat people who can not absorb vitamin B12. This medicine may be used for other   purposes; ask your health care provider or pharmacist if you have questions. What should I tell my health care provider before I take this medicine? They need to know if you have any of these conditions: -kidney disease -Leber's  disease -megaloblastic anemia -an unusual or allergic reaction to cyanocobalamin, cobalt, other medicines, foods, dyes, or preservatives -pregnant or trying to get pregnant -breast-feeding How should I use this medicine? This medicine is injected into a muscle or deeply under the skin. It is usually given by a health care professional in a clinic or doctor's office. However, your doctor may teach you how to inject yourself. Follow all instructions. Talk to your pediatrician regarding the use of this medicine in children. Special care may be needed. Overdosage: If you think you have taken too much of this medicine contact a poison control center or emergency room at once. NOTE: This medicine is only for you. Do not share this medicine with others. What if I miss a dose? If you are given your dose at a clinic or doctor's office, call to reschedule your appointment. If you give your own injections and you miss a dose, take it as soon as you can. If it is almost time for your next dose, take only that dose. Do not take double or extra doses. What may interact with this medicine? -colchicine -heavy alcohol intake This list may not describe all possible interactions. Give your health care provider a list of all the medicines, herbs, non-prescription drugs, or dietary supplements you use. Also tell them if you smoke, drink alcohol, or use illegal drugs. Some items may interact with your medicine. What should I watch for while using this medicine? Visit your doctor or health care professional regularly. You may need blood work done while you are taking this medicine. You may need to follow a special diet. Talk to your doctor. Limit your alcohol intake and avoid smoking to get the best benefit. What side effects may I notice from receiving this medicine? Side effects that you should report to your doctor or health care professional as soon as possible: -allergic reactions like skin rash, itching or  hives, swelling of the face, lips, or tongue -blue tint to skin -chest tightness, pain -difficulty breathing, wheezing -dizziness -red, swollen painful area on the leg Side effects that usually do not require medical attention (report to your doctor or health care professional if they continue or are bothersome): -diarrhea -headache This list may not describe all possible side effects. Call your doctor for medical advice about side effects. You may report side effects to FDA at 1-800-FDA-1088. Where should I keep my medicine? Keep out of the reach of children. Store at room temperature between 15 and 30 degrees C (59 and 85 degrees F). Protect from light. Throw away any unused medicine after the expiration date. NOTE: This sheet is a summary. It may not cover all possible information. If you have questions about this medicine, talk to your doctor, pharmacist, or health care provider.    2016, Elsevier/Gold Standard. (2007-05-25 22:10:20)  

## 2016-12-31 ENCOUNTER — Ambulatory Visit: Payer: Medicare Other

## 2016-12-31 ENCOUNTER — Other Ambulatory Visit: Payer: Medicare Other

## 2017-01-01 ENCOUNTER — Emergency Department (HOSPITAL_COMMUNITY): Payer: Medicare Other

## 2017-01-01 ENCOUNTER — Other Ambulatory Visit: Payer: Self-pay

## 2017-01-01 ENCOUNTER — Encounter (HOSPITAL_COMMUNITY): Payer: Self-pay

## 2017-01-01 ENCOUNTER — Emergency Department (HOSPITAL_COMMUNITY)
Admission: EM | Admit: 2017-01-01 | Discharge: 2017-01-02 | Disposition: A | Discharge status: Home / Self Care | Payer: Medicare Other | Attending: Emergency Medicine | Admitting: Emergency Medicine

## 2017-01-01 DIAGNOSIS — R531 Weakness: Secondary | ICD-10-CM

## 2017-01-01 DIAGNOSIS — M6281 Muscle weakness (generalized): Secondary | ICD-10-CM | POA: Insufficient documentation

## 2017-01-01 DIAGNOSIS — I10 Essential (primary) hypertension: Secondary | ICD-10-CM | POA: Insufficient documentation

## 2017-01-01 DIAGNOSIS — Z96641 Presence of right artificial hip joint: Secondary | ICD-10-CM | POA: Insufficient documentation

## 2017-01-01 DIAGNOSIS — R51 Headache: Secondary | ICD-10-CM | POA: Diagnosis present

## 2017-01-01 DIAGNOSIS — J810 Acute pulmonary edema: Secondary | ICD-10-CM | POA: Diagnosis not present

## 2017-01-01 DIAGNOSIS — T8131XA Disruption of external operation (surgical) wound, not elsewhere classified, initial encounter: Secondary | ICD-10-CM | POA: Diagnosis not present

## 2017-01-01 DIAGNOSIS — G8918 Other acute postprocedural pain: Secondary | ICD-10-CM | POA: Diagnosis not present

## 2017-01-01 DIAGNOSIS — I2510 Atherosclerotic heart disease of native coronary artery without angina pectoris: Secondary | ICD-10-CM | POA: Insufficient documentation

## 2017-01-01 DIAGNOSIS — E119 Type 2 diabetes mellitus without complications: Secondary | ICD-10-CM | POA: Insufficient documentation

## 2017-01-01 DIAGNOSIS — R030 Elevated blood-pressure reading, without diagnosis of hypertension: Secondary | ICD-10-CM | POA: Diagnosis not present

## 2017-01-01 DIAGNOSIS — Z951 Presence of aortocoronary bypass graft: Secondary | ICD-10-CM | POA: Diagnosis not present

## 2017-01-01 LAB — BASIC METABOLIC PANEL
ANION GAP: 8 (ref 5–15)
BUN: 27 mg/dL — ABNORMAL HIGH (ref 6–20)
CALCIUM: 8.9 mg/dL (ref 8.9–10.3)
CHLORIDE: 105 mmol/L (ref 101–111)
CO2: 24 mmol/L (ref 22–32)
CREATININE: 1.4 mg/dL — AB (ref 0.61–1.24)
GFR calc Af Amer: 47 mL/min — ABNORMAL LOW (ref >60)
GFR calc non Af Amer: 41 mL/min — ABNORMAL LOW (ref >60)
GLUCOSE: 177 mg/dL — AB (ref 65–99)
Potassium: 5 mmol/L (ref 3.5–5.1)
Sodium: 137 mmol/L (ref 135–145)

## 2017-01-01 LAB — CBC WITH DIFFERENTIAL/PLATELET
BASOS PCT: 0 %
Basophils Absolute: 0 10*3/uL (ref 0.0–0.1)
Eosinophils Absolute: 0.1 10*3/uL (ref 0.0–0.7)
Eosinophils Relative: 1 %
HEMATOCRIT: 38.1 % — AB (ref 39.0–52.0)
HEMOGLOBIN: 12.8 g/dL — AB (ref 13.0–17.0)
LYMPHS ABS: 1 10*3/uL (ref 0.7–4.0)
Lymphocytes Relative: 12 %
MCH: 30.8 pg (ref 26.0–34.0)
MCHC: 33.6 g/dL (ref 30.0–36.0)
MCV: 91.8 fL (ref 78.0–100.0)
MONO ABS: 0.8 10*3/uL (ref 0.1–1.0)
MONOS PCT: 10 %
NEUTROS ABS: 6.1 10*3/uL (ref 1.7–7.7)
NEUTROS PCT: 77 %
Platelets: 193 10*3/uL (ref 150–400)
RBC: 4.15 MIL/uL — ABNORMAL LOW (ref 4.22–5.81)
RDW: 12.6 % (ref 11.5–15.5)
WBC: 7.9 10*3/uL (ref 4.0–10.5)

## 2017-01-01 MED ORDER — HYDROCODONE-ACETAMINOPHEN 5-325 MG PO TABS: 1.0000 | ORAL_TABLET | ORAL | 0 refills | Status: DC | PRN

## 2017-01-01 MED ORDER — HYDROCODONE-ACETAMINOPHEN 5-325 MG PO TABS
1.0000 | ORAL_TABLET | Freq: Once | ORAL | Status: AC
  Administered 2017-01-01: 1 via ORAL
  Filled 2017-01-01: qty 1

## 2017-01-01 MED ORDER — DOCUSATE SODIUM 100 MG PO CAPS: 100.0000 mg | ORAL_CAPSULE | Freq: Two times a day (BID) | ORAL | 0 refills | Status: DC

## 2017-01-01 MED ORDER — FENTANYL CITRATE (PF) 100 MCG/2ML IJ SOLN
50.0000 ug | Freq: Once | INTRAMUSCULAR | Status: AC
  Administered 2017-01-01: 50 ug via INTRAVENOUS
  Filled 2017-01-01: qty 2

## 2017-01-01 NOTE — ED Notes (Signed)
Social work at bedside.  

## 2017-01-01 NOTE — ED Notes (Signed)
Bed: UD43 Expected date:  Expected time:  Means of arrival:  Comments: EMS/83 y.o./pain

## 2017-01-01 NOTE — ED Notes (Signed)
ED Provider at bedside. 

## 2017-01-01 NOTE — Progress Notes (Addendum)
CSW met with pt and confirmed pt and pt's daughter had consulted with CM and were aware pt can connect with [facility] tomorrow morning for assistance in the home.  Pt lives in an independent living facility and will return there tonight with his daughter to await being connected with HH in the morning.  Family states there are no social work concerns and was appreciative of the CSW,  Please reconsult if future social work needs arise.  CSW signing off, as social work intervention is no longer needed.  Alphonse Guild. Amor Packard, LCSW, LCAS, CSI Clinical Social Worker Ph: [phone]

## 2017-01-01 NOTE — Discharge Instructions (Signed)
You were seen in the ED for evaluation of pain after surgery and weakness. Your labs and chest x-ray was normal. Take the pain medication only as needed for severe pain. Vicodin has some Tylenol in it already so factor that in. Take colace as you can develop some constipation.    Call your surgeon in the morning.

## 2017-01-01 NOTE — ED Notes (Signed)
Pt voided in urinal 265ml with assist

## 2017-01-01 NOTE — ED Triage Notes (Signed)
Pt. Brought in via [facility] from [facility] assisted living. Pt. Had blepharoplasty at 1030 am. Local anaesthesia. Sent home with no pain medications and to take OTC pain medication. Around 1500 anastesia wore off and pt became in severe pain. Pt. Is hypertensive. Bp-270/100 & 180/60 per ems. 20g in left hand. 174mcg of fetnayl per EMS.

## 2017-01-01 NOTE — ED Notes (Signed)
Pt is in xray at this time 

## 2017-01-01 NOTE — Care Management (Addendum)
[facility] ED CM received call from [facility] ED CSW concerning possible [facility] services. CM contacted Dr. Laverta Baltimore EDP at [facility] who explained patient patient had an OP procedure today and presented with weakness, inquiring about a PCA to stay with patient tonight Patient is at [facility] living. CM contacted patient's family Brayson Livesey [phone] who states that she may be able to stay with him overnight at the facility. CM explained [facility] services were recommended, patient and family are agreeable. Offered choice [facility] was selected. Referral faxed to [facility] via [place]. CM explained that the nurse will call to arrange initial visit 24- 48 hours post discharge from the ED. Patient and family verbalized understanding, teach back done. No further ED CM needs identified.

## 2017-01-01 NOTE — ED Provider Notes (Signed)
Emergency Department Provider Note   I have reviewed the triage vital signs and the nursing notes.   HISTORY  Chief Complaint Facial Pain   HPI Lawrence Warner is a 83 y.o. male with PMH of CAD, renal insufficiency, HTN, DM, HLD presents to the emergency department for evaluation of pain and generalized weakness.  The patient lives at an assisted living facility.  He had a blepharoplasty today at the [facility]. patient states that he was instructed to take Tylenol as needed for pain and sleep in an elevated position.  States that the anesthesia wore off around 3 PM today and he had excruciating pain over the incision sites.  He notes generalized weakness and fatigue.  He was not discharged with additional pain medication.  Patient states he did not call his surgeon when his pain worsened.  Denies any fevers or chills.  He does feel slightly short of breath.  Denies chest pain.  No nausea, vomiting, diarrhea.   Past Medical History:  Diagnosis Date  . BPH (benign prostatic hypertrophy)   . Chronic heart failure ([place])   . Chronic renal insufficiency   . Coronary artery disease    CABG in 2000  . Diabetes type 2, controlled ([place])    Diet-controlled  . Diabetic neuropathy ([place])   . Dyslipidemia   . Hypertension   . ITP (idiopathic thrombocytopenic purpura)   . Osteoarthritis   . Pneumonia     Patient Active Problem List   Diagnosis Date Noted  . Diastolic congestive heart failure ([place]) 11/26/2016  . CRI (chronic renal insufficiency), stage 3 (moderate) ([place]) 11/26/2016  . LBBB (left bundle branch block) 11/26/2016  . Hypoxia   . Acute respiratory failure ([place]) 07/20/2016  . Respiratory failure ([place]) 02/28/2016  . Acute respiratory failure with hypoxemia ([place]) 02/28/2016  . Dyslipidemia 02/28/2016  . AKI (acute kidney injury) ([place])   . Influenza   . Sepsis ([place])   . Constipation 12/07/2015  . Bradycardia 11/28/2015  . Port catheter in place 10/24/2015  . GERD  (gastroesophageal reflux disease) 10/09/2015  . Gait instability 10/09/2015  . Chest pain 10/05/2015  . Diet-controlled diabetes mellitus ([place]) 05/07/2015  . Hx of CABG 05/07/2015  . Essential hypertension 05/07/2015  . Muscle cramps 04/25/2015  . Shortness of breath 04/11/2015  . Bronchitis 03/14/2015  . Idiopathic thrombocytopenic purpura ([place]) 09/27/2014    Past Surgical History:  Procedure Laterality Date  . CHOLECYSTECTOMY    . CORONARY ARTERY BYPASS GRAFT  2000  . Right hip replacement  2014  . TONSILLECTOMY      Current Outpatient Rx  . Order #: [id number] Class: Historical Med  . Order #: [id number] Class: Historical Med  . Order #: [id number] Class: Print  . Order #: [id number] Class: Historical Med  . Order #: [id number] Class: Print  . Order #: [id number] Class: Historical Med  . Order #: [id number] Class: Historical Med  . Order #: [id number] Class: No Print  . Order #: [id number] Class: Print  . Order #: [id number] Class: Print  . Order #: [id number] Class: Print    Allergies Alfuzosin hcl er and Penicillins  Family History  Problem Relation Age of Onset  . Lung disease Father   . Cancer Brother     Social History Social History   Tobacco Use  . Smoking status: Former Smoker    Packs/day: 2.00    Years: 25.00    Pack years: 50.00  . Smokeless tobacco: Never Used  Substance Use Topics  . Alcohol use: No  . Drug use: No  Review of Systems  Constitutional: No fever/chills. Positive generalized weakness.  Eyes: No visual changes. ENT: No sore throat. Cardiovascular: Denies chest pain. Respiratory: Denies shortness of breath. Gastrointestinal: No abdominal pain.  No nausea, no vomiting.  No diarrhea.  No constipation. Genitourinary: Negative for dysuria. Musculoskeletal: Negative for back pain. Skin: Positive incision site pain.  Neurological: Negative for headaches, focal weakness or numbness.  10-point ROS otherwise  negative.  ____________________________________________   PHYSICAL EXAM:  VITAL SIGNS: ED Triage Vitals  Enc Vitals Group     BP 01/01/17 1818 (!) 184/55     Pulse Rate 01/01/17 1818 (!) 45     Resp 01/01/17 1818 14     Temp 01/01/17 1818 98 F (36.7 C)     Temp Source 01/01/17 1818 Oral     SpO2 01/01/17 1817 95 %     Pain Score 01/01/17 1825 2   Constitutional: Alert and oriented. Appears fatigued and in pain.  Eyes: Conjunctivae are normal.  Head: Atraumatic. Nose: No congestion/rhinnorhea. Mouth/Throat: Mucous membranes are moist.  Oropharynx non-erythematous. Neck: No stridor.  Cardiovascular: Normal rate, regular rhythm. Good peripheral circulation. Grossly normal heart sounds.   Respiratory: Normal respiratory effort.  No retractions. Lungs CTAB. Gastrointestinal: Soft and nontender. No distention.  Musculoskeletal: No lower extremity tenderness nor edema. No gross deformities of extremities. Neurologic:  Normal speech and language. No gross focal neurologic deficits are appreciated.  Skin:  Skin is warm, dry and intact. Two clean, dry incisions across the forehead. No erythema or warmth to touch.   ____________________________________________   LABS (all labs ordered are listed, but only abnormal results are displayed)  Labs Reviewed  BASIC METABOLIC PANEL - Abnormal; Notable for the following components:      Result Value   Glucose, Bld 177 (*)    BUN 27 (*)    Creatinine, Ser 1.40 (*)    GFR calc non Af Amer 41 (*)    GFR calc Af Amer 47 (*)    All other components within normal limits  CBC WITH DIFFERENTIAL/PLATELET - Abnormal; Notable for the following components:   RBC 4.15 (*)    Hemoglobin 12.8 (*)    HCT 38.1 (*)    All other components within normal limits   ____________________________________________  RADIOLOGY  Dg Chest 2 View  Result Date: 01/01/2017 CLINICAL DATA:  Dyspnea EXAM: CHEST  2 VIEW COMPARISON:  07/27/2016 chest radiograph.  FINDINGS: Intact sternotomy wires. CABG clips overlie the mediastinum. Stable cardiomediastinal silhouette with mild cardiomegaly. No pneumothorax. No pleural effusion. Borderline mild pulmonary edema. Mild left basilar scarring versus atelectasis. IMPRESSION: 1. Stable mild cardiomegaly.  Borderline mild pulmonary edema . 2. Mild left basilar scarring versus atelectasis. Electronically Signed   By: Ilona Sorrel M.D.   On: 01/01/2017 20:33    ____________________________________________   PROCEDURES  Procedure(s) performed:   Procedures  None ____________________________________________   INITIAL IMPRESSION / ASSESSMENT AND PLAN / ED COURSE  Pertinent labs & imaging results that were available during my care of the patient were reviewed by me and considered in my medical decision making (see chart for details).  Patient presents to the emergency department for evaluation of severe postoperative pain after blepharoplasty today at the [place].  Patient also complaining of severe fatigue.  Blood pressure elevated likely secondary to pain.  Plan for pain control, labs, chest x-ray as patient is wearing nasal cannula O2 for comfort and describes some mild SOB.   09:24 PM Pain is better controlled at this  time.  Patient describing generalized weakness and feels he is unsafe to return home because he has no assistance there.  I have engaged both social work and Tourist information centre manager who are calling family and his assisted living facility to try and arrange something.  He has follow-up with his surgeon at the [place] on Friday.  Plan to trial hydrocodone here in the emergency department prior to discharge without medication.   10:45 PM Daughter now at bedside. She will take the patient home and coordinate with home health and the patient's surgeon at the [place]. Will discharge home with Hydrocodone which the patient has tolerated well here in the ED.   At this time, I do not feel there is any life-threatening  condition present. I have reviewed and discussed all results (EKG, imaging, lab, urine as appropriate), exam findings with patient. I have reviewed nursing notes and appropriate previous records.  I feel the patient is safe to be discharged home without further emergent workup. Discussed usual and customary return precautions. Patient and family (if present) verbalize understanding and are comfortable with this plan.  Patient will follow-up with their primary care provider. If they do not have a primary care provider, information for follow-up has been provided to them. All questions have been answered.  ____________________________________________  FINAL CLINICAL IMPRESSION(S) / ED DIAGNOSES  Final diagnoses:  Post-op pain  Generalized weakness     MEDICATIONS GIVEN DURING THIS VISIT:  Medications  fentaNYL (SUBLIMAZE) injection 50 mcg (50 mcg Intravenous Given 01/01/17 1935)  HYDROcodone-acetaminophen (NORCO/VICODIN) 5-325 MG per tablet 1 tablet (1 tablet Oral Given 01/01/17 2106)  fentaNYL (SUBLIMAZE) injection 50 mcg (50 mcg Intravenous Given 01/01/17 2342)     NEW OUTPATIENT MEDICATIONS STARTED DURING THIS VISIT:  Hydrocodone and Colace  Note:  This document was prepared using Dragon voice recognition software and may include unintentional dictation errors.  Nanda Quinton, MD Emergency Medicine    Vear Staton, [facility], MD 01/02/17 [phone]

## 2017-01-07 ENCOUNTER — Telehealth: Payer: Self-pay | Admitting: Cardiology

## 2017-01-07 NOTE — Telephone Encounter (Signed)
returned call to pt he states that he had to stop his isosorbide because it was causing extreme cramping when he was trying to sleep at night. He states that he was also in the the bathroom "loosing his water" all night. So he had to stop/ he states that he tried to take it many different ways different times, different days, different times of the day and finally he had to stop. He denies any chest pain at this time. He will call back with any chest pain or any other sx that he has. He states that he will discuss this at his appt with [facility] in [place].

## 2017-01-07 NOTE — Telephone Encounter (Signed)
New message     Pt wanted to let you know he stopped the fluid pill you put him on , it gave him cramps , he did not know name

## 2017-01-08 ENCOUNTER — Telehealth: Payer: Self-pay

## 2017-01-08 NOTE — Telephone Encounter (Signed)
Verified patient upcoming appointment. Per 11/14 los

## 2017-01-08 NOTE — Telephone Encounter (Signed)
Noted  

## 2017-01-14 ENCOUNTER — Other Ambulatory Visit (HOSPITAL_BASED_OUTPATIENT_CLINIC_OR_DEPARTMENT_OTHER): Payer: Medicare Other

## 2017-01-14 ENCOUNTER — Ambulatory Visit (HOSPITAL_BASED_OUTPATIENT_CLINIC_OR_DEPARTMENT_OTHER): Payer: Medicare Other

## 2017-01-14 VITALS — BP 151/45 | HR 58 | Temp 98.1°F

## 2017-01-14 DIAGNOSIS — D693 Immune thrombocytopenic purpura: Secondary | ICD-10-CM

## 2017-01-14 DIAGNOSIS — Z23 Encounter for immunization: Secondary | ICD-10-CM

## 2017-01-14 DIAGNOSIS — Z95828 Presence of other vascular implants and grafts: Secondary | ICD-10-CM

## 2017-01-14 LAB — CBC & DIFF AND RETIC
BASO%: 0.2 % (ref 0.0–2.0)
Basophils Absolute: 0 10*3/uL (ref 0.0–0.1)
EOS%: 2.2 % (ref 0.0–7.0)
Eosinophils Absolute: 0.1 10*3/uL (ref 0.0–0.5)
HEMATOCRIT: 39.3 % (ref 38.4–49.9)
HGB: 12.8 g/dL — ABNORMAL LOW (ref 13.0–17.1)
Immature Retic Fract: 8.7 % (ref 3.00–10.60)
LYMPH#: 1.4 10*3/uL (ref 0.9–3.3)
LYMPH%: 21.6 % (ref 14.0–49.0)
MCH: 30.2 pg (ref 27.2–33.4)
MCHC: 32.6 g/dL (ref 32.0–36.0)
MCV: 92.7 fL (ref 79.3–98.0)
MONO#: 0.9 10*3/uL (ref 0.1–0.9)
MONO%: 14 % (ref 0.0–14.0)
NEUT%: 62 % (ref 39.0–75.0)
NEUTROS ABS: 3.9 10*3/uL (ref 1.5–6.5)
PLATELETS: 126 10*3/uL — AB (ref 140–400)
RBC: 4.24 10*6/uL (ref 4.20–5.82)
RDW: 12.9 % (ref 11.0–14.6)
RETIC %: 1.49 % (ref 0.80–1.80)
Retic Ct Abs: 63.18 10*3/uL (ref 34.80–93.90)
WBC: 6.3 10*3/uL (ref 4.0–10.3)
nRBC: 0 % (ref 0–0)

## 2017-01-14 MED ORDER — ROMIPLOSTIM 250 MCG ~~LOC~~ SOLR
50.0000 ug | SUBCUTANEOUS | Status: DC
  Administered 2017-01-14: 50 ug via SUBCUTANEOUS
  Filled 2017-01-14: qty 0.1

## 2017-01-28 ENCOUNTER — Other Ambulatory Visit (HOSPITAL_BASED_OUTPATIENT_CLINIC_OR_DEPARTMENT_OTHER): Payer: Medicare Other

## 2017-01-28 ENCOUNTER — Ambulatory Visit (HOSPITAL_BASED_OUTPATIENT_CLINIC_OR_DEPARTMENT_OTHER): Payer: Medicare Other

## 2017-01-28 VITALS — BP 158/62 | HR 58 | Temp 98.2°F | Resp 20

## 2017-01-28 DIAGNOSIS — E538 Deficiency of other specified B group vitamins: Secondary | ICD-10-CM | POA: Diagnosis not present

## 2017-01-28 DIAGNOSIS — Z95828 Presence of other vascular implants and grafts: Secondary | ICD-10-CM

## 2017-01-28 DIAGNOSIS — Z23 Encounter for immunization: Secondary | ICD-10-CM

## 2017-01-28 DIAGNOSIS — D693 Immune thrombocytopenic purpura: Secondary | ICD-10-CM

## 2017-01-28 LAB — CBC & DIFF AND RETIC
BASO%: 0.2 % (ref 0.0–2.0)
Basophils Absolute: 0 10*3/uL (ref 0.0–0.1)
EOS%: 2.2 % (ref 0.0–7.0)
Eosinophils Absolute: 0.1 10*3/uL (ref 0.0–0.5)
HEMATOCRIT: 38.4 % (ref 38.4–49.9)
HGB: 12.6 g/dL — ABNORMAL LOW (ref 13.0–17.1)
Immature Retic Fract: 8.8 % (ref 3.00–10.60)
LYMPH%: 24 % (ref 14.0–49.0)
MCH: 30.4 pg (ref 27.2–33.4)
MCHC: 32.8 g/dL (ref 32.0–36.0)
MCV: 92.5 fL (ref 79.3–98.0)
MONO#: 0.6 10*3/uL (ref 0.1–0.9)
MONO%: 12.5 % (ref 0.0–14.0)
NEUT#: 2.8 10*3/uL (ref 1.5–6.5)
NEUT%: 61.1 % (ref 39.0–75.0)
PLATELETS: 156 10*3/uL (ref 140–400)
RBC: 4.15 10*6/uL — ABNORMAL LOW (ref 4.20–5.82)
RDW: 13.2 % (ref 11.0–14.6)
Retic %: 1.54 % (ref 0.80–1.80)
Retic Ct Abs: 63.91 10*3/uL (ref 34.80–93.90)
WBC: 4.6 10*3/uL (ref 4.0–10.3)
lymph#: 1.1 10*3/uL (ref 0.9–3.3)
nRBC: 0 % (ref 0–0)

## 2017-01-28 MED ORDER — CYANOCOBALAMIN 1000 MCG/ML IJ SOLN
1000.0000 ug | INTRAMUSCULAR | Status: DC
  Administered 2017-01-28: 1000 ug via SUBCUTANEOUS

## 2017-01-28 MED ORDER — ROMIPLOSTIM 250 MCG ~~LOC~~ SOLR
50.0000 ug | SUBCUTANEOUS | Status: DC
  Administered 2017-01-28: 50 ug via SUBCUTANEOUS
  Filled 2017-01-28: qty 0.1

## 2017-01-28 NOTE — Patient Instructions (Signed)
Romiplostim injection What is this medicine? ROMIPLOSTIM ([place] mi PLOE stim) helps your body make more platelets. This medicine is used to treat low platelets caused by chronic idiopathic thrombocytopenic purpura (ITP). This medicine may be used for other purposes; ask your health care provider or pharmacist if you have questions. What should I tell my health care provider before I take this medicine? They need to know if you have any of these conditions: -cancer or myelodysplastic syndrome -low blood counts, like low white cell, platelet, or red cell counts -take medicines that treat or prevent blood clots -an unusual or allergic reaction to romiplostim, mannitol, other medicines, foods, dyes, or preservatives -pregnant or trying to get pregnant -breast-feeding How should I use this medicine? This medicine is for injection under the skin. It is given by a health care professional in a hospital or clinic setting. A special MedGuide will be given to you before your injection. Read this information carefully each time. Talk to your pediatrician regarding the use of this medicine in children. Special care may be needed. Overdosage: If you think you have taken too much of this medicine contact a poison control center or emergency room at once. NOTE: This medicine is only for you. Do not share this medicine with others. What if I miss a dose? It is important not to miss your dose. Call your doctor or health care professional if you are unable to keep an appointment. What may interact with this medicine? Interactions are not expected. This list may not describe all possible interactions. Give your health care provider a list of all the medicines, herbs, non-prescription drugs, or dietary supplements you use. Also tell them if you smoke, drink alcohol, or use illegal drugs. Some items may interact with your medicine. What should I watch for while using this medicine? Your condition will be monitored  carefully while you are receiving this medicine. Visit your prescriber or health care professional for regular checks on your progress and for the needed blood tests. It is important to keep all appointments. What side effects may I notice from receiving this medicine? Side effects that you should report to your doctor or health care professional as soon as possible: -allergic reactions like skin rash, itching or hives, swelling of the face, lips, or tongue -shortness of breath, chest pain, swelling in a leg -unusual bleeding or bruising Side effects that usually do not require medical attention (report to your doctor or health care professional if they continue or are bothersome): -dizziness -headache -muscle aches -pain in arms and legs -stomach pain -trouble sleeping This list may not describe all possible side effects. Call your doctor for medical advice about side effects. You may report side effects to FDA at 1-800-FDA-1088. Where should I keep my medicine? This drug is given in a hospital or clinic and will not be stored at home. NOTE: This sheet is a summary. It may not cover all possible information. If you have questions about this medicine, talk to your doctor, pharmacist, or health care provider.    2016, Elsevier/Gold Standard. (2007-10-12 15:13:04) Cyanocobalamin, Vitamin B12 injection What is this medicine? CYANOCOBALAMIN (sye an oh koe BAL a min) is a man made form of vitamin B12. Vitamin B12 is used in the growth of healthy blood cells, nerve cells, and proteins in the body. It also helps with the metabolism of fats and carbohydrates. This medicine is used to treat people who can not absorb vitamin B12. This medicine may be used for other   purposes; ask your health care provider or pharmacist if you have questions. What should I tell my health care provider before I take this medicine? They need to know if you have any of these conditions: -kidney disease -Leber's  disease -megaloblastic anemia -an unusual or allergic reaction to cyanocobalamin, cobalt, other medicines, foods, dyes, or preservatives -pregnant or trying to get pregnant -breast-feeding How should I use this medicine? This medicine is injected into a muscle or deeply under the skin. It is usually given by a health care professional in a clinic or doctor's office. However, your doctor may teach you how to inject yourself. Follow all instructions. Talk to your pediatrician regarding the use of this medicine in children. Special care may be needed. Overdosage: If you think you have taken too much of this medicine contact a poison control center or emergency room at once. NOTE: This medicine is only for you. Do not share this medicine with others. What if I miss a dose? If you are given your dose at a clinic or doctor's office, call to reschedule your appointment. If you give your own injections and you miss a dose, take it as soon as you can. If it is almost time for your next dose, take only that dose. Do not take double or extra doses. What may interact with this medicine? -colchicine -heavy alcohol intake This list may not describe all possible interactions. Give your health care provider a list of all the medicines, herbs, non-prescription drugs, or dietary supplements you use. Also tell them if you smoke, drink alcohol, or use illegal drugs. Some items may interact with your medicine. What should I watch for while using this medicine? Visit your doctor or health care professional regularly. You may need blood work done while you are taking this medicine. You may need to follow a special diet. Talk to your doctor. Limit your alcohol intake and avoid smoking to get the best benefit. What side effects may I notice from receiving this medicine? Side effects that you should report to your doctor or health care professional as soon as possible: -allergic reactions like skin rash, itching or  hives, swelling of the face, lips, or tongue -blue tint to skin -chest tightness, pain -difficulty breathing, wheezing -dizziness -red, swollen painful area on the leg Side effects that usually do not require medical attention (report to your doctor or health care professional if they continue or are bothersome): -diarrhea -headache This list may not describe all possible side effects. Call your doctor for medical advice about side effects. You may report side effects to FDA at 1-800-FDA-1088. Where should I keep my medicine? Keep out of the reach of children. Store at room temperature between 15 and 30 degrees C (59 and 85 degrees F). Protect from light. Throw away any unused medicine after the expiration date. NOTE: This sheet is a summary. It may not cover all possible information. If you have questions about this medicine, talk to your doctor, pharmacist, or health care provider.    2016, Elsevier/Gold Standard. (2007-05-25 22:10:20)  

## 2017-01-30 DIAGNOSIS — E119 Type 2 diabetes mellitus without complications: Secondary | ICD-10-CM | POA: Diagnosis not present

## 2017-01-30 DIAGNOSIS — I5022 Chronic systolic (congestive) heart failure: Secondary | ICD-10-CM | POA: Diagnosis not present

## 2017-01-30 DIAGNOSIS — J020 Streptococcal pharyngitis: Secondary | ICD-10-CM | POA: Diagnosis not present

## 2017-01-30 DIAGNOSIS — I10 Essential (primary) hypertension: Secondary | ICD-10-CM | POA: Diagnosis not present

## 2017-01-30 DIAGNOSIS — M6281 Muscle weakness (generalized): Secondary | ICD-10-CM | POA: Diagnosis not present

## 2017-02-07 ENCOUNTER — Telehealth: Payer: Self-pay | Admitting: Hematology

## 2017-02-07 NOTE — Telephone Encounter (Signed)
Faxed office note to the [place]

## 2017-02-11 ENCOUNTER — Ambulatory Visit (HOSPITAL_BASED_OUTPATIENT_CLINIC_OR_DEPARTMENT_OTHER): Payer: Medicare Other | Admitting: Hematology

## 2017-02-11 ENCOUNTER — Encounter: Payer: Self-pay | Admitting: Hematology

## 2017-02-11 ENCOUNTER — Ambulatory Visit (HOSPITAL_BASED_OUTPATIENT_CLINIC_OR_DEPARTMENT_OTHER): Payer: Medicare Other

## 2017-02-11 ENCOUNTER — Other Ambulatory Visit (HOSPITAL_BASED_OUTPATIENT_CLINIC_OR_DEPARTMENT_OTHER): Payer: Medicare Other

## 2017-02-11 VITALS — BP 140/60 | HR 106 | Temp 98.0°F | Resp 14 | Ht 67.0 in | Wt 207.5 lb

## 2017-02-11 DIAGNOSIS — E538 Deficiency of other specified B group vitamins: Secondary | ICD-10-CM | POA: Diagnosis not present

## 2017-02-11 DIAGNOSIS — I2510 Atherosclerotic heart disease of native coronary artery without angina pectoris: Secondary | ICD-10-CM

## 2017-02-11 DIAGNOSIS — Z23 Encounter for immunization: Secondary | ICD-10-CM

## 2017-02-11 DIAGNOSIS — N189 Chronic kidney disease, unspecified: Secondary | ICD-10-CM | POA: Diagnosis not present

## 2017-02-11 DIAGNOSIS — D693 Immune thrombocytopenic purpura: Secondary | ICD-10-CM

## 2017-02-11 DIAGNOSIS — Z95828 Presence of other vascular implants and grafts: Secondary | ICD-10-CM

## 2017-02-11 LAB — CBC WITH DIFFERENTIAL/PLATELET
BASO%: 0.4 % (ref 0.0–2.0)
Basophils Absolute: 0 10*3/uL (ref 0.0–0.1)
EOS%: 1.7 % (ref 0.0–7.0)
Eosinophils Absolute: 0.1 10*3/uL (ref 0.0–0.5)
HEMATOCRIT: 39.5 % (ref 38.4–49.9)
HGB: 13 g/dL (ref 13.0–17.1)
LYMPH%: 22.2 % (ref 14.0–49.0)
MCH: 30.5 pg (ref 27.2–33.4)
MCHC: 32.9 g/dL (ref 32.0–36.0)
MCV: 92.7 fL (ref 79.3–98.0)
MONO#: 0.7 10*3/uL (ref 0.1–0.9)
MONO%: 14.3 % — ABNORMAL HIGH (ref 0.0–14.0)
NEUT%: 61.4 % (ref 39.0–75.0)
NEUTROS ABS: 3.2 10*3/uL (ref 1.5–6.5)
Platelets: 140 10*3/uL (ref 140–400)
RBC: 4.26 10*6/uL (ref 4.20–5.82)
RDW: 13.5 % (ref 11.0–14.6)
WBC: 5.2 10*3/uL (ref 4.0–10.3)
lymph#: 1.2 10*3/uL (ref 0.9–3.3)
nRBC: 0 % (ref 0–0)

## 2017-02-11 MED ORDER — ROMIPLOSTIM 250 MCG ~~LOC~~ SOLR
50.0000 ug | SUBCUTANEOUS | Status: AC
  Administered 2017-02-11: 50 ug via SUBCUTANEOUS
  Filled 2017-02-11: qty 0.1

## 2017-02-11 NOTE — Patient Instructions (Signed)
Romiplostim injection What is this medicine? ROMIPLOSTIM ([place] mi PLOE stim) helps your body make more platelets. This medicine is used to treat low platelets caused by chronic idiopathic thrombocytopenic purpura (ITP). This medicine may be used for other purposes; ask your health care provider or pharmacist if you have questions. What should I tell my health care provider before I take this medicine? They need to know if you have any of these conditions: -cancer or myelodysplastic syndrome -low blood counts, like low white cell, platelet, or red cell counts -take medicines that treat or prevent blood clots -an unusual or allergic reaction to romiplostim, mannitol, other medicines, foods, dyes, or preservatives -pregnant or trying to get pregnant -breast-feeding How should I use this medicine? This medicine is for injection under the skin. It is given by a health care professional in a hospital or clinic setting. A special MedGuide will be given to you before your injection. Read this information carefully each time. Talk to your pediatrician regarding the use of this medicine in children. Special care may be needed. Overdosage: If you think you have taken too much of this medicine contact a poison control center or emergency room at once. NOTE: This medicine is only for you. Do not share this medicine with others. What if I miss a dose? It is important not to miss your dose. Call your doctor or health care professional if you are unable to keep an appointment. What may interact with this medicine? Interactions are not expected. This list may not describe all possible interactions. Give your health care provider a list of all the medicines, herbs, non-prescription drugs, or dietary supplements you use. Also tell them if you smoke, drink alcohol, or use illegal drugs. Some items may interact with your medicine. What should I watch for while using this medicine? Your condition will be monitored  carefully while you are receiving this medicine. Visit your prescriber or health care professional for regular checks on your progress and for the needed blood tests. It is important to keep all appointments. What side effects may I notice from receiving this medicine? Side effects that you should report to your doctor or health care professional as soon as possible: -allergic reactions like skin rash, itching or hives, swelling of the face, lips, or tongue -shortness of breath, chest pain, swelling in a leg -unusual bleeding or bruising Side effects that usually do not require medical attention (report to your doctor or health care professional if they continue or are bothersome): -dizziness -headache -muscle aches -pain in arms and legs -stomach pain -trouble sleeping This list may not describe all possible side effects. Call your doctor for medical advice about side effects. You may report side effects to FDA at 1-800-FDA-1088. Where should I keep my medicine? This drug is given in a hospital or clinic and will not be stored at home. NOTE: This sheet is a summary. It may not cover all possible information. If you have questions about this medicine, talk to your doctor, pharmacist, or health care provider.    2016, Elsevier/Gold Standard. (2007-10-12 15:13:04) Cyanocobalamin, Vitamin B12 injection What is this medicine? CYANOCOBALAMIN (sye an oh koe BAL a min) is a man made form of vitamin B12. Vitamin B12 is used in the growth of healthy blood cells, nerve cells, and proteins in the body. It also helps with the metabolism of fats and carbohydrates. This medicine is used to treat people who can not absorb vitamin B12. This medicine may be used for other   purposes; ask your health care provider or pharmacist if you have questions. What should I tell my health care provider before I take this medicine? They need to know if you have any of these conditions: -kidney disease -Leber's  disease -megaloblastic anemia -an unusual or allergic reaction to cyanocobalamin, cobalt, other medicines, foods, dyes, or preservatives -pregnant or trying to get pregnant -breast-feeding How should I use this medicine? This medicine is injected into a muscle or deeply under the skin. It is usually given by a health care professional in a clinic or doctor's office. However, your doctor may teach you how to inject yourself. Follow all instructions. Talk to your pediatrician regarding the use of this medicine in children. Special care may be needed. Overdosage: If you think you have taken too much of this medicine contact a poison control center or emergency room at once. NOTE: This medicine is only for you. Do not share this medicine with others. What if I miss a dose? If you are given your dose at a clinic or doctor's office, call to reschedule your appointment. If you give your own injections and you miss a dose, take it as soon as you can. If it is almost time for your next dose, take only that dose. Do not take double or extra doses. What may interact with this medicine? -colchicine -heavy alcohol intake This list may not describe all possible interactions. Give your health care provider a list of all the medicines, herbs, non-prescription drugs, or dietary supplements you use. Also tell them if you smoke, drink alcohol, or use illegal drugs. Some items may interact with your medicine. What should I watch for while using this medicine? Visit your doctor or health care professional regularly. You may need blood work done while you are taking this medicine. You may need to follow a special diet. Talk to your doctor. Limit your alcohol intake and avoid smoking to get the best benefit. What side effects may I notice from receiving this medicine? Side effects that you should report to your doctor or health care professional as soon as possible: -allergic reactions like skin rash, itching or  hives, swelling of the face, lips, or tongue -blue tint to skin -chest tightness, pain -difficulty breathing, wheezing -dizziness -red, swollen painful area on the leg Side effects that usually do not require medical attention (report to your doctor or health care professional if they continue or are bothersome): -diarrhea -headache This list may not describe all possible side effects. Call your doctor for medical advice about side effects. You may report side effects to FDA at 1-800-FDA-1088. Where should I keep my medicine? Keep out of the reach of children. Store at room temperature between 15 and 30 degrees C (59 and 85 degrees F). Protect from light. Throw away any unused medicine after the expiration date. NOTE: This sheet is a summary. It may not cover all possible information. If you have questions about this medicine, talk to your doctor, pharmacist, or health care provider.    2016, Elsevier/Gold Standard. (2007-05-25 22:10:20)  

## 2017-02-11 NOTE — Progress Notes (Signed)
.    Hematology oncology clinic note  Date of service: 02/11/17   Patient Care Team: Clovia Cuff, MD as PCP - General (Internal Medicine) Rico Sheehan (Hematology and Oncology)  CHIEF COMPLAINTS/PURPOSE OF CONSULTATION: Follow-up for ITP   Diagnosis: Idiopathic thrombocytopenic purpura   Current treatment: Nplate q2 weekly to maintain reasonable platelet counts >50K.  Requirement have ranged from 39mcg/kg to 27mcg/kg. Recently stable counts with 0.5-1 mcg/kg qweekly  Previous treatment: Steroids, IVIG.  HISTORY OF PRESENTING ILLNESS:  please see my previous clinic note from 09/28/2014 for details of initial presentation and course of treatment.  Interval history   Pt presents to the office today for f/u of his ITP . Of note sine the patients last visit, his platelet count remained stable at 140k as of 02/11/2017. He reported to ED on 01/01/2017 for evaluation of pain and and generalized weakness. He states he is doing well overall. He states he is enjoying his time at his living facility.   On review of systems, pt denies abdominal pain, bleeding, fever, chills, night sweats an any other accompanying symptoms.   MEDICAL HISTORY:  Past Medical History:  Diagnosis Date  . BPH (benign prostatic hypertrophy)   . Chronic heart failure ([place])   . Chronic renal insufficiency   . Coronary artery disease    CABG in 2000  . Diabetes type 2, controlled ([place])    Diet-controlled  . Diabetic neuropathy ([place])   . Dyslipidemia   . Hypertension   . ITP (idiopathic thrombocytopenic purpura)   . Osteoarthritis   . Pneumonia     SURGICAL HISTORY: Past Surgical History:  Procedure Laterality Date  . CHOLECYSTECTOMY    . CORONARY ARTERY BYPASS GRAFT  2000  . Right hip replacement  2014  . TONSILLECTOMY      SOCIAL HISTORY: Social History   Socioeconomic History  . Marital status: Single    Spouse name: Not on file  . Number of children: Not on file  . Years of education:  Not on file  . Highest education level: Not on file  Social Needs  . Financial resource strain: Not on file  . Food insecurity - worry: Not on file  . Food insecurity - inability: Not on file  . Transportation needs - medical: Not on file  . Transportation needs - non-medical: Not on file  Occupational History  . Occupation: Salesman  Tobacco Use  . Smoking status: Former Smoker    Packs/day: 2.00    Years: 25.00    Pack years: 50.00  . Smokeless tobacco: Never Used  Substance and Sexual Activity  . Alcohol use: No  . Drug use: No  . Sexual activity: Not Currently  Other Topics Concern  . Not on file  Social History Narrative    Two children.     He is living in [place] which is an assisted living facility. One of his son is also moving to [facility] -  and has a Designer, jewellery in religious studies. Patient notes that his other son is a Systems analyst with multiple awards.  FAMILY HISTORY: Family History  Problem Relation Age of Onset  . Lung disease Father   . Cancer Brother     ALLERGIES:  is allergic to alfuzosin hcl er and penicillins.  MEDICATIONS:  Current Outpatient Medications  Medication Sig Dispense Refill  . acetaminophen (TYLENOL) 500 MG tablet Take 500 mg by mouth 2 (two) times daily. May take an additional 500 mg two times a day as needed  for pain    . alfuzosin (UROXATRAL) 10 MG 24 hr tablet Take 10 mg daily with breakfast by mouth.    . docusate sodium (COLACE) 100 MG capsule Take 1 capsule (100 mg total) every 12 (twelve) hours by mouth. 60 capsule 0  . fluticasone (FLONASE) 50 MCG/ACT nasal spray Place 2 sprays into both nostrils 2 (two) times daily. For 7days 5 g 0  . HYDROcodone-acetaminophen (NORCO/VICODIN) 5-325 MG tablet Take 1 tablet every 4 (four) hours as needed by mouth. 10 tablet 0  . hydroxypropyl methylcellulose / hypromellose (ISOPTO TEARS / GONIOVISC) 2.5 % ophthalmic solution Place 1 drop into both eyes 3 (three) times daily as  needed for dry eyes.    . isosorbide mononitrate (IMDUR) 60 MG 24 hr tablet Take 1 tablet (60 mg total) by mouth daily. 90 tablet 3  . magnesium oxide (MAG-OX) 400 MG tablet Take 400 mg daily by mouth.    . Multiple Vitamin (MULTIVITAMIN WITH MINERALS) TABS tablet Take 1 tablet by mouth daily.    Marland Kitchen omeprazole (PRILOSEC) 20 MG capsule Take 1 capsule (20 mg total) by mouth 2 (two) times daily before a meal.    . nitroGLYCERIN (NITROSTAT) 0.4 MG SL tablet Place 1 tablet (0.4 mg total) under the tongue every 5 (five) minutes as needed for chest pain. 25 tablet 3   No current facility-administered medications for this visit.    Facility-Administered Medications Ordered in Other Visits  Medication Dose Route Frequency Provider Last Rate Last Dose  . cyanocobalamin ((VITAMIN B-12)) injection 1,000 mcg  1,000 mcg Subcutaneous Q30 days [name], MD   1,000 mcg at 03/13/16 1348  . cyanocobalamin ((VITAMIN B-12)) injection 1,000 mcg  1,000 mcg Subcutaneous Q30 days [name], MD   1,000 mcg at 05/14/16 1001  . cyanocobalamin ((VITAMIN B-12)) injection 1,000 mcg  1,000 mcg Subcutaneous Q30 days [name], MD   1,000 mcg at 09/10/16 1022  . cyanocobalamin ((VITAMIN B-12)) injection 1,000 mcg  1,000 mcg Subcutaneous Q30 days [name], MD   1,000 mcg at 12/03/16 1038  . romiPLOStim (NPLATE) injection 50 mcg  0.5 mcg/kg Subcutaneous Weekly [name], MD   50 mcg at 03/13/16 1349  . romiPLOStim (NPLATE) injection 50 mcg  50 mcg Subcutaneous Weekly [name], MD   50 mcg at 05/07/16 1106  . romiPLOStim (NPLATE) injection 50 mcg  0.5 mcg/kg Subcutaneous Weekly [name], MD   50 mcg at 05/14/16 1001  . romiPLOStim (NPLATE) injection 50 mcg  50 mcg Subcutaneous Weekly [name], MD   50 mcg at 08/13/16 0931  . romiPLOStim (NPLATE) injection 50 mcg  50 mcg Subcutaneous Weekly [name], MD   50 mcg at 08/27/16 4097  .  romiPLOStim (NPLATE) injection 50 mcg  50 mcg Subcutaneous Weekly [name], MD   50 mcg at 09/03/16 1027  . romiPLOStim (NPLATE) injection 50 mcg  0.5 mcg/kg Subcutaneous Weekly [name], MD   50 mcg at 09/10/16 1023  . romiPLOStim (NPLATE) injection 50 mcg  0.5 mcg/kg Subcutaneous Weekly [name], MD   50 mcg at 12/03/16 1038  . romiPLOStim (NPLATE) injection 50 mcg  50 mcg Subcutaneous Weekly [name], MD   50 mcg at 02/11/17 1039   Romiplostim 5 mcg/kg weekly   REVIEW OF SYSTEMS:   10 point review of system is negative except as noted above  PHYSICAL EXAMINATION: ECOG PERFORMANCE STATUS: 1 - Symptomatic but  completely ambulatory  Vitals:   02/11/17 1011 02/11/17 1016  BP: (!) 168/41 140/60  Pulse: (!) 106   Resp: 14   Temp: 98 F (36.7 C)   SpO2: 96%    Filed Weights   02/11/17 1011  Weight: 207 lb 8 oz (94.1 kg)   GENERAL: Elderly gentleman in no acute distress:alert and comfortable SKIN: skin color, texture, turgor are normal, no rashes or significant lesions EYES: normal, conjunctiva are pink and non-injected, sclera clear OROPHARYNX:no exudate, no erythema and lips, buccal mucosa, and tongue normal  NECK: supple, thyroid normal size, non-tender, without nodularity LYMPH:  no palpable lymphadenopathy in the cervical, axillary or inguinal LUNGS: Air entry bilaterally equal, No rales no rhonchi HEART: regular rate & rhythm and 2 x 6 systolic murmur over aortic area, and no lower extremity edema ABDOMEN:abdomen soft, non-tender and normal bowel sounds PSYCH: alert & oriented x 3 with fluent speech NEURO: no focal motor/sensory deficits  LABORATORY DATA:  . CBC Latest Ref Rng & Units 02/11/2017 01/28/2017 01/14/2017  WBC 4.0 - 10.3 10e3/uL 5.2 4.6 6.3  Hemoglobin 13.0 - 17.1 g/dL 13.0 12.6(L) 12.8(L)  Hematocrit 38.4 - 49.9 % 39.5 38.4 39.3  Platelets 140 - 400 10e3/uL 140 156 126(L)    CMP Latest Ref Rng & Units 01/01/2017  12/03/2016 10/22/2016  Glucose 65 - 99 mg/dL 177(H) 169(H) 168(H)  BUN 6 - 20 mg/dL 27(H) 31.8(H) 23.0  Creatinine 0.61 - 1.24 mg/dL 1.40(H) 1.7(H) 1.5(H)  Sodium 135 - 145 mmol/L 137 141 139  Potassium 3.5 - 5.1 mmol/L 5.0 4.5 4.6  Chloride 101 - 111 mmol/L 105 - -  CO2 22 - 32 mmol/L 24 24 26   Calcium 8.9 - 10.3 mg/dL 8.9 9.6 9.7  Total Protein 6.4 - 8.3 g/dL - 6.5 6.5  Total Bilirubin 0.20 - 1.20 mg/dL - 0.36 0.63  Alkaline Phos 40 - 150 U/L - 76 64  AST 5 - 34 U/L - 20 21  ALT 0 - 55 U/L - 16 17    ASSESSMENT & PLAN:   83 year old Caucasian male with multiple medical comorbidities with  #1 Chronic ITP since 1989. Has previously been responsive to steroids and has received IVIG on one occasion. He has been maintained on 3-29mcg/kg weekly of Nplate since mid 1638 initially with Dr. Arvin Collard at [facility] and then with Dr. Jimmie Molly at [facility] in [place].  No issues with bleeding . Ultrasound abdomen showed normal spleen size SPEP- no obvious monoclonal protein. IFE showed possibility of an restrictive bands in the IgG and Lambda lanes. Patient's platelet counts have remained remarkably stable at Romiplostim doses of  0.5 mcg/kg every 2 weeks   #2 H/o B12 deficiency Plan  -labs stable at this time -Continue his scheduled dose of Nplate at 4.6KZL/DJ every other week and adjust dose to maintain platelet counts >50k. If further downtrend in platelets might need to adjust dose/go back to weekly shots. -continue B12 replacement monthly for B12 deficiency. -Avoid NSAIDs -labs q2weeks  #2  Coronary artery disease status post CABG in year 2000. Not on aspirin due to his ITP . BNP was elevated. ECHO today shows mildly reduced systolic function and LVH  Mild intermittent chest pain. No shortness of breath. Plan - was evaluated for his chronic intermittent CP by cardiology - Dr Minus Breeding and was started on Imdur.  #3 chronic kidney disease  -Continue to management as  per primary care physician  PLAN: Continue Labs q2weeks Nplate q2 weeks  B12 qmonthly  RTC with Dr Irene Limbo in 3 months  Total time spent 15 minutes more than 50% time on direct patient contact counseling and coordination of care.  Sullivan Lone MD MS Hematology/Oncology Physician [facility]  (Office):       [phone] (Work cell):  [phone] (Fax):           [phone]  This document serves as a record of services personally performed by Sullivan Lone, MD. It was created on her behalf by Alean Rinne, a trained medical scribe. The creation of this record is based on the scribe's personal observations and the provider's statements to them.   .I have reviewed the above documentation for accuracy and completeness, and I agree with the above. [name] MD MS

## 2017-02-12 ENCOUNTER — Telehealth: Payer: Self-pay | Admitting: Hematology

## 2017-02-12 NOTE — Telephone Encounter (Signed)
Mailed calendar of upcoming January and February appointments.

## 2017-02-26 ENCOUNTER — Ambulatory Visit (HOSPITAL_BASED_OUTPATIENT_CLINIC_OR_DEPARTMENT_OTHER): Payer: Medicare Other

## 2017-02-26 ENCOUNTER — Other Ambulatory Visit (HOSPITAL_BASED_OUTPATIENT_CLINIC_OR_DEPARTMENT_OTHER): Payer: Medicare Other

## 2017-02-26 DIAGNOSIS — E538 Deficiency of other specified B group vitamins: Secondary | ICD-10-CM | POA: Diagnosis not present

## 2017-02-26 DIAGNOSIS — D693 Immune thrombocytopenic purpura: Secondary | ICD-10-CM | POA: Diagnosis present

## 2017-02-26 DIAGNOSIS — Z23 Encounter for immunization: Secondary | ICD-10-CM

## 2017-02-26 DIAGNOSIS — Z95828 Presence of other vascular implants and grafts: Secondary | ICD-10-CM

## 2017-02-26 LAB — CBC WITH DIFFERENTIAL/PLATELET
BASO%: 0.7 % (ref 0.0–2.0)
Basophils Absolute: 0 10*3/uL (ref 0.0–0.1)
EOS%: 1.4 % (ref 0.0–7.0)
Eosinophils Absolute: 0.1 10*3/uL (ref 0.0–0.5)
HCT: 40.9 % (ref 38.4–49.9)
HGB: 13.4 g/dL (ref 13.0–17.1)
LYMPH%: 21.2 % (ref 14.0–49.0)
MCH: 30.4 pg (ref 27.2–33.4)
MCHC: 32.8 g/dL (ref 32.0–36.0)
MCV: 92.6 fL (ref 79.3–98.0)
MONO#: 0.7 10*3/uL (ref 0.1–0.9)
MONO%: 13.1 % (ref 0.0–14.0)
NEUT#: 3.4 10*3/uL (ref 1.5–6.5)
NEUT%: 63.6 % (ref 39.0–75.0)
PLATELETS: 176 10*3/uL (ref 140–400)
RBC: 4.41 10*6/uL (ref 4.20–5.82)
RDW: 13.5 % (ref 11.0–14.6)
WBC: 5.3 10*3/uL (ref 4.0–10.3)
lymph#: 1.1 10*3/uL (ref 0.9–3.3)

## 2017-02-26 MED ORDER — ROMIPLOSTIM 250 MCG ~~LOC~~ SOLR
50.0000 ug | SUBCUTANEOUS | Status: DC
  Administered 2017-02-26: 50 ug via SUBCUTANEOUS
  Filled 2017-02-26: qty 0.1

## 2017-02-26 MED ORDER — CYANOCOBALAMIN 1000 MCG/ML IJ SOLN
1000.0000 ug | INTRAMUSCULAR | Status: DC
  Administered 2017-02-26: 1000 ug via SUBCUTANEOUS

## 2017-02-26 NOTE — Patient Instructions (Signed)
Romiplostim injection What is this medicine? ROMIPLOSTIM ([place] mi PLOE stim) helps your body make more platelets. This medicine is used to treat low platelets caused by chronic idiopathic thrombocytopenic purpura (ITP). This medicine may be used for other purposes; ask your health care provider or pharmacist if you have questions. What should I tell my health care provider before I take this medicine? They need to know if you have any of these conditions: -cancer or myelodysplastic syndrome -low blood counts, like low white cell, platelet, or red cell counts -take medicines that treat or prevent blood clots -an unusual or allergic reaction to romiplostim, mannitol, other medicines, foods, dyes, or preservatives -pregnant or trying to get pregnant -breast-feeding How should I use this medicine? This medicine is for injection under the skin. It is given by a health care professional in a hospital or clinic setting. A special MedGuide will be given to you before your injection. Read this information carefully each time. Talk to your pediatrician regarding the use of this medicine in children. Special care may be needed. Overdosage: If you think you have taken too much of this medicine contact a poison control center or emergency room at once. NOTE: This medicine is only for you. Do not share this medicine with others. What if I miss a dose? It is important not to miss your dose. Call your doctor or health care professional if you are unable to keep an appointment. What may interact with this medicine? Interactions are not expected. This list may not describe all possible interactions. Give your health care provider a list of all the medicines, herbs, non-prescription drugs, or dietary supplements you use. Also tell them if you smoke, drink alcohol, or use illegal drugs. Some items may interact with your medicine. What should I watch for while using this medicine? Your condition will be monitored  carefully while you are receiving this medicine. Visit your prescriber or health care professional for regular checks on your progress and for the needed blood tests. It is important to keep all appointments. What side effects may I notice from receiving this medicine? Side effects that you should report to your doctor or health care professional as soon as possible: -allergic reactions like skin rash, itching or hives, swelling of the face, lips, or tongue -shortness of breath, chest pain, swelling in a leg -unusual bleeding or bruising Side effects that usually do not require medical attention (report to your doctor or health care professional if they continue or are bothersome): -dizziness -headache -muscle aches -pain in arms and legs -stomach pain -trouble sleeping This list may not describe all possible side effects. Call your doctor for medical advice about side effects. You may report side effects to FDA at 1-800-FDA-1088. Where should I keep my medicine? This drug is given in a hospital or clinic and will not be stored at home. NOTE: This sheet is a summary. It may not cover all possible information. If you have questions about this medicine, talk to your doctor, pharmacist, or health care provider.    2016, Elsevier/Gold Standard. (2007-10-12 15:13:04) Cyanocobalamin, Vitamin B12 injection What is this medicine? CYANOCOBALAMIN (sye an oh koe BAL a min) is a man made form of vitamin B12. Vitamin B12 is used in the growth of healthy blood cells, nerve cells, and proteins in the body. It also helps with the metabolism of fats and carbohydrates. This medicine is used to treat people who can not absorb vitamin B12. This medicine may be used for other   purposes; ask your health care provider or pharmacist if you have questions. What should I tell my health care provider before I take this medicine? They need to know if you have any of these conditions: -kidney disease -Leber's  disease -megaloblastic anemia -an unusual or allergic reaction to cyanocobalamin, cobalt, other medicines, foods, dyes, or preservatives -pregnant or trying to get pregnant -breast-feeding How should I use this medicine? This medicine is injected into a muscle or deeply under the skin. It is usually given by a health care professional in a clinic or doctor's office. However, your doctor may teach you how to inject yourself. Follow all instructions. Talk to your pediatrician regarding the use of this medicine in children. Special care may be needed. Overdosage: If you think you have taken too much of this medicine contact a poison control center or emergency room at once. NOTE: This medicine is only for you. Do not share this medicine with others. What if I miss a dose? If you are given your dose at a clinic or doctor's office, call to reschedule your appointment. If you give your own injections and you miss a dose, take it as soon as you can. If it is almost time for your next dose, take only that dose. Do not take double or extra doses. What may interact with this medicine? -colchicine -heavy alcohol intake This list may not describe all possible interactions. Give your health care provider a list of all the medicines, herbs, non-prescription drugs, or dietary supplements you use. Also tell them if you smoke, drink alcohol, or use illegal drugs. Some items may interact with your medicine. What should I watch for while using this medicine? Visit your doctor or health care professional regularly. You may need blood work done while you are taking this medicine. You may need to follow a special diet. Talk to your doctor. Limit your alcohol intake and avoid smoking to get the best benefit. What side effects may I notice from receiving this medicine? Side effects that you should report to your doctor or health care professional as soon as possible: -allergic reactions like skin rash, itching or  hives, swelling of the face, lips, or tongue -blue tint to skin -chest tightness, pain -difficulty breathing, wheezing -dizziness -red, swollen painful area on the leg Side effects that usually do not require medical attention (report to your doctor or health care professional if they continue or are bothersome): -diarrhea -headache This list may not describe all possible side effects. Call your doctor for medical advice about side effects. You may report side effects to FDA at 1-800-FDA-1088. Where should I keep my medicine? Keep out of the reach of children. Store at room temperature between 15 and 30 degrees C (59 and 85 degrees F). Protect from light. Throw away any unused medicine after the expiration date. NOTE: This sheet is a summary. It may not cover all possible information. If you have questions about this medicine, talk to your doctor, pharmacist, or health care provider.    2016, Elsevier/Gold Standard. (2007-05-25 22:10:20)  

## 2017-02-27 ENCOUNTER — Ambulatory Visit (INDEPENDENT_AMBULATORY_CARE_PROVIDER_SITE_OTHER): Payer: Medicare Other | Admitting: Cardiology

## 2017-02-27 ENCOUNTER — Encounter: Payer: Self-pay | Admitting: Cardiology

## 2017-02-27 VITALS — BP 144/62 | HR 68 | Ht 67.0 in | Wt 210.0 lb

## 2017-02-27 DIAGNOSIS — I10 Essential (primary) hypertension: Secondary | ICD-10-CM | POA: Diagnosis not present

## 2017-02-27 DIAGNOSIS — I447 Left bundle-branch block, unspecified: Secondary | ICD-10-CM

## 2017-02-27 DIAGNOSIS — Z951 Presence of aortocoronary bypass graft: Secondary | ICD-10-CM | POA: Diagnosis not present

## 2017-02-27 DIAGNOSIS — I5030 Unspecified diastolic (congestive) heart failure: Secondary | ICD-10-CM

## 2017-02-27 DIAGNOSIS — N183 Chronic kidney disease, stage 3 unspecified: Secondary | ICD-10-CM

## 2017-02-27 DIAGNOSIS — D693 Immune thrombocytopenic purpura: Secondary | ICD-10-CM

## 2017-02-27 DIAGNOSIS — I259 Chronic ischemic heart disease, unspecified: Secondary | ICD-10-CM

## 2017-02-27 NOTE — Patient Instructions (Signed)
Medication Instructions:  Your physician recommends that you continue on your current medications as directed. Please refer to the Current Medication list given to you today.  Follow-Up: Your physician wants you to follow-up in: 6 months with Dr. Percival Spanish. You will receive a reminder letter in the mail two months in advance. If you don't receive a letter, please call our office to schedule the follow-up appointment.    If you need a refill on your cardiac medications before your next appointment, please call your pharmacy.

## 2017-02-27 NOTE — Progress Notes (Signed)
02/27/2017 Lawrence Warner   02/10/1919  [id number]  Primary Physician Clovia Cuff, MD Primary Cardiologist: Dr Percival Spanish  HPI:  84 y/o male with a history of CABG in 2000 in [place]. He saw Dr Percival Spanish to get established in Oct 2017. He had done well from a cardiac standpoint. He lives in a retirement community. He tells me he recently met a lady friend there and they are now an item and quite happy.   He was admitted in Jan 2018 with CAP. He was admitted again 07/20/16-07/26/16 with CAP and acute respiratory failure. He came back to the ED 07/27/16 with respiratory failure felt to be secondary to diastolic CHF. He was in the office in Oct 2018 and complained of SSCP. We added low dose Imdur. He saw dr Percival Spanish in follow up and this was increased to 60 mg. He then says he had side effects from the Imdur which included nocturia and leg cramps. He finally stopped it. He denies any having chest pain now.    Current Outpatient Medications  Medication Sig Dispense Refill  . acetaminophen (TYLENOL) 500 MG tablet Take 500 mg by mouth 2 (two) times daily. May take an additional 500 mg two times a day as needed for pain    . docusate sodium (COLACE) 100 MG capsule Take 1 capsule (100 mg total) every 12 (twelve) hours by mouth. 60 capsule 0  . fluticasone (FLONASE) 50 MCG/ACT nasal spray Place 2 sprays into both nostrils 2 (two) times daily. For 7days 5 g 0  . hydroxypropyl methylcellulose / hypromellose (ISOPTO TEARS / GONIOVISC) 2.5 % ophthalmic solution Place 1 drop into both eyes 3 (three) times daily as needed for dry eyes.    . isosorbide mononitrate (IMDUR) 60 MG 24 hr tablet Take 1 tablet (60 mg total) by mouth daily. 90 tablet 3  . losartan (COZAAR) 100 MG tablet Take 100 mg by mouth daily.    . magnesium oxide (MAG-OX) 400 MG tablet Take 400 mg daily by mouth.    . Multiple Vitamin (MULTIVITAMIN WITH MINERALS) TABS tablet Take 1 tablet by mouth daily.    Marland Kitchen omeprazole (PRILOSEC) 20 MG capsule Take  1 capsule (20 mg total) by mouth 2 (two) times daily before a meal.    . nitroGLYCERIN (NITROSTAT) 0.4 MG SL tablet Place 1 tablet (0.4 mg total) under the tongue every 5 (five) minutes as needed for chest pain. 25 tablet 3   No current facility-administered medications for this visit.    Facility-Administered Medications Ordered in Other Visits  Medication Dose Route Frequency Provider Last Rate Last Dose  . cyanocobalamin ((VITAMIN B-12)) injection 1,000 mcg  1,000 mcg Subcutaneous Q30 days [name], MD   1,000 mcg at 03/13/16 1348  . cyanocobalamin ((VITAMIN B-12)) injection 1,000 mcg  1,000 mcg Subcutaneous Q30 days [name], MD   1,000 mcg at 05/14/16 1001  . cyanocobalamin ((VITAMIN B-12)) injection 1,000 mcg  1,000 mcg Subcutaneous Q30 days [name], MD   1,000 mcg at 09/10/16 1022  . cyanocobalamin ((VITAMIN B-12)) injection 1,000 mcg  1,000 mcg Subcutaneous Q30 days [name], MD   1,000 mcg at 12/03/16 1038  . romiPLOStim (NPLATE) injection 50 mcg  0.5 mcg/kg Subcutaneous Weekly [name], MD   50 mcg at 03/13/16 1349  . romiPLOStim (NPLATE) injection 50 mcg  50 mcg Subcutaneous Weekly [name], MD   50 mcg at 05/07/16 1106  . romiPLOStim (NPLATE) injection 50 mcg  0.5 mcg/kg Subcutaneous Weekly [name], MD   50 mcg at 05/14/16 1001  . romiPLOStim (NPLATE) injection 50 mcg  50 mcg Subcutaneous Weekly [name], MD   50 mcg at 08/13/16 0931  . romiPLOStim (NPLATE) injection 50 mcg  50 mcg Subcutaneous Weekly [name], MD   50 mcg at 08/27/16 4403  . romiPLOStim (NPLATE) injection 50 mcg  50 mcg Subcutaneous Weekly [name], MD   50 mcg at 09/03/16 1027  . romiPLOStim (NPLATE) injection 50 mcg  0.5 mcg/kg Subcutaneous Weekly [name], MD   50 mcg at 09/10/16 1023  . romiPLOStim (NPLATE) injection 50 mcg  0.5 mcg/kg Subcutaneous Weekly [name], MD    50 mcg at 12/03/16 1038  . romiPLOStim (NPLATE) injection 50 mcg  50 mcg Subcutaneous Weekly [name], MD   50 mcg at 02/11/17 1039    Allergies  Allergen Reactions  . Alfuzosin Hcl Er Other (See Comments)    Confusion   . Penicillins Hives and Other (See Comments)    Has patient had a PCN reaction causing immediate rash, facial/tongue/throat swelling, SOB or lightheadedness with hypotension: Yes Has patient had a PCN reaction causing severe rash involving mucus membranes or skin necrosis: Unknown Has patient had a PCN reaction that required hospitalization: Unknown Has patient had a PCN reaction occurring within the last 10 years: Unknown If all of the above answers are "NO", then may proceed with Cephalosporin use.     Past Medical History:  Diagnosis Date  . BPH (benign prostatic hypertrophy)   . Chronic heart failure ([place])   . Chronic renal insufficiency   . Coronary artery disease    CABG in 2000  . Diabetes type 2, controlled ([place])    Diet-controlled  . Diabetic neuropathy ([place])   . Dyslipidemia   . Hypertension   . ITP (idiopathic thrombocytopenic purpura)   . Osteoarthritis   . Pneumonia     Social History   Socioeconomic History  . Marital status: Single    Spouse name: Not on file  . Number of children: Not on file  . Years of education: Not on file  . Highest education level: Not on file  Social Needs  . Financial resource strain: Not on file  . Food insecurity - worry: Not on file  . Food insecurity - inability: Not on file  . Transportation needs - medical: Not on file  . Transportation needs - non-medical: Not on file  Occupational History  . Occupation: Salesman  Tobacco Use  . Smoking status: Former Smoker    Packs/day: 2.00    Years: 25.00    Pack years: 50.00  . Smokeless tobacco: Never Used  Substance and Sexual Activity  . Alcohol use: No  . Drug use: No  . Sexual activity: Not Currently  Other Topics Concern  . Not on file   Social History Narrative    Two children.       Family History  Problem Relation Age of Onset  . Lung disease Father   . Cancer Brother      Review of Systems: General: negative for chills, fever, night sweats or weight changes.  Cardiovascular: negative for chest pain, dyspnea on exertion, edema, orthopnea, palpitations, paroxysmal nocturnal dyspnea or shortness of breath Dermatological: negative for rash Respiratory: negative for cough or wheezing Urologic: negative for hematuria Abdominal: negative for nausea, vomiting, diarrhea, bright red blood per rectum, melena, or hematemesis Neurologic: negative for visual  changes, syncope, or dizziness All other systems reviewed and are otherwise negative except as noted above.    Blood pressure (!) 144/62, pulse 68, height '5\' 7"'$  (1.702 m), weight 210 lb (95.3 kg), SpO2 93 %.  General appearance: alert, cooperative and no distress Lungs: clear to auscultation bilaterally Heart: regular rate and rhythm Skin: pale cool dry Neurologic: Grossly normal  EKG NSR, LBBB, PACs and PVCs  ASSESSMENT AND PLAN:   Chest pain He could not tolerate Imdur though his side effects were atypical for this drug. He is currently chest pain free off Imdur  Hx of CABG CABG 1840 in [facility]   Diastolic congestive heart failure [facility]) June 2018 after admission for CAP. Echo showed EF 60-65% with moderate LVH No CHF on exam  CRI (chronic renal insufficiency), stage 3 (moderate) (HCC) SCR 1.5, GFR 30's   Idiopathic thrombocytopenic purpura (H[facility]) [facility] Limbo He cannot take anti platelet agents including ASA  LBBB and bradycardia Unable to take beta blocker   PLAN  Same Rx. F/U with Dr Percival Spanish in 6 months.   Kerin Ransom PA-C 02/27/2017 10:05 AM

## 2017-03-06 DIAGNOSIS — E119 Type 2 diabetes mellitus without complications: Secondary | ICD-10-CM | POA: Diagnosis not present

## 2017-03-06 DIAGNOSIS — I5022 Chronic systolic (congestive) heart failure: Secondary | ICD-10-CM | POA: Diagnosis not present

## 2017-03-06 DIAGNOSIS — M1990 Unspecified osteoarthritis, unspecified site: Secondary | ICD-10-CM | POA: Diagnosis not present

## 2017-03-06 DIAGNOSIS — I10 Essential (primary) hypertension: Secondary | ICD-10-CM | POA: Diagnosis not present

## 2017-03-11 ENCOUNTER — Inpatient Hospital Stay: Payer: Medicare Other

## 2017-03-11 ENCOUNTER — Other Ambulatory Visit: Payer: Medicare Other

## 2017-03-11 ENCOUNTER — Ambulatory Visit: Payer: Medicare Other

## 2017-03-11 ENCOUNTER — Inpatient Hospital Stay: Payer: Medicare Other | Attending: Hematology

## 2017-03-11 DIAGNOSIS — D693 Immune thrombocytopenic purpura: Secondary | ICD-10-CM

## 2017-03-11 DIAGNOSIS — Z95828 Presence of other vascular implants and grafts: Secondary | ICD-10-CM

## 2017-03-11 LAB — CBC WITH DIFFERENTIAL (CANCER CENTER ONLY)
BASOS ABS: 0 10*3/uL (ref 0.0–0.1)
BASOS PCT: 0 %
EOS ABS: 0.1 10*3/uL (ref 0.0–0.5)
Eosinophils Relative: 1 %
HEMATOCRIT: 39.5 % (ref 38.4–49.9)
HEMOGLOBIN: 12.9 g/dL — AB (ref 13.0–17.1)
Lymphocytes Relative: 17 %
Lymphs Abs: 1.1 10*3/uL (ref 0.9–3.3)
MCH: 30.7 pg (ref 27.2–33.4)
MCHC: 32.7 g/dL (ref 32.0–36.0)
MCV: 94 fL (ref 79.3–98.0)
MONOS PCT: 9 %
Monocytes Absolute: 0.6 10*3/uL (ref 0.1–0.9)
NEUTROS ABS: 4.8 10*3/uL (ref 1.5–6.5)
NEUTROS PCT: 73 %
Platelet Count: 195 10*3/uL (ref 140–400)
RBC: 4.2 MIL/uL (ref 4.20–5.82)
RDW: 13.7 % (ref 11.0–15.6)
WBC: 6.7 10*3/uL (ref 4.0–10.3)

## 2017-03-11 LAB — RETICULOCYTES
RBC.: 4.2 MIL/uL (ref 4.20–5.82)
RETIC COUNT ABSOLUTE: 67.2 10*3/uL (ref 34.8–93.9)
Retic Ct Pct: 1.6 % (ref 0.8–1.8)

## 2017-03-11 MED ORDER — ROMIPLOSTIM 250 MCG ~~LOC~~ SOLR
0.5000 ug/kg | SUBCUTANEOUS | Status: DC
  Administered 2017-03-11: 50 ug via SUBCUTANEOUS
  Filled 2017-03-11: qty 0.1

## 2017-03-13 DIAGNOSIS — I5022 Chronic systolic (congestive) heart failure: Secondary | ICD-10-CM | POA: Diagnosis not present

## 2017-03-13 DIAGNOSIS — R05 Cough: Secondary | ICD-10-CM | POA: Diagnosis not present

## 2017-03-13 DIAGNOSIS — E119 Type 2 diabetes mellitus without complications: Secondary | ICD-10-CM | POA: Diagnosis not present

## 2017-03-13 DIAGNOSIS — I10 Essential (primary) hypertension: Secondary | ICD-10-CM | POA: Diagnosis not present

## 2017-03-13 DIAGNOSIS — M6281 Muscle weakness (generalized): Secondary | ICD-10-CM | POA: Diagnosis not present

## 2017-03-14 ENCOUNTER — Other Ambulatory Visit: Payer: Self-pay

## 2017-03-14 ENCOUNTER — Inpatient Hospital Stay (HOSPITAL_COMMUNITY)
Admission: EM | Admit: 2017-03-14 | Discharge: 2017-03-19 | DRG: 202 | Disposition: A | Discharge status: Home Health | Payer: Medicare Other | Attending: Nephrology | Admitting: Nephrology

## 2017-03-14 ENCOUNTER — Encounter (HOSPITAL_COMMUNITY): Payer: Self-pay | Admitting: Emergency Medicine

## 2017-03-14 ENCOUNTER — Other Ambulatory Visit (HOSPITAL_COMMUNITY): Payer: Self-pay

## 2017-03-14 ENCOUNTER — Emergency Department (HOSPITAL_COMMUNITY): Payer: Medicare Other

## 2017-03-14 DIAGNOSIS — I509 Heart failure, unspecified: Secondary | ICD-10-CM | POA: Diagnosis present

## 2017-03-14 DIAGNOSIS — N189 Chronic kidney disease, unspecified: Secondary | ICD-10-CM | POA: Diagnosis present

## 2017-03-14 DIAGNOSIS — N400 Enlarged prostate without lower urinary tract symptoms: Secondary | ICD-10-CM | POA: Diagnosis present

## 2017-03-14 DIAGNOSIS — R0602 Shortness of breath: Secondary | ICD-10-CM | POA: Diagnosis not present

## 2017-03-14 DIAGNOSIS — I130 Hypertensive heart and chronic kidney disease with heart failure and stage 1 through stage 4 chronic kidney disease, or unspecified chronic kidney disease: Secondary | ICD-10-CM | POA: Diagnosis not present

## 2017-03-14 DIAGNOSIS — Z66 Do not resuscitate: Secondary | ICD-10-CM | POA: Diagnosis present

## 2017-03-14 DIAGNOSIS — I10 Essential (primary) hypertension: Secondary | ICD-10-CM | POA: Diagnosis not present

## 2017-03-14 DIAGNOSIS — J9601 Acute respiratory failure with hypoxia: Secondary | ICD-10-CM | POA: Diagnosis present

## 2017-03-14 DIAGNOSIS — N183 Chronic kidney disease, stage 3 (moderate): Secondary | ICD-10-CM

## 2017-03-14 DIAGNOSIS — D693 Immune thrombocytopenic purpura: Secondary | ICD-10-CM | POA: Diagnosis present

## 2017-03-14 DIAGNOSIS — J209 Acute bronchitis, unspecified: Secondary | ICD-10-CM | POA: Diagnosis present

## 2017-03-14 DIAGNOSIS — E119 Type 2 diabetes mellitus without complications: Secondary | ICD-10-CM

## 2017-03-14 DIAGNOSIS — Z96641 Presence of right artificial hip joint: Secondary | ICD-10-CM | POA: Diagnosis present

## 2017-03-14 DIAGNOSIS — E785 Hyperlipidemia, unspecified: Secondary | ICD-10-CM | POA: Diagnosis present

## 2017-03-14 DIAGNOSIS — J9621 Acute and chronic respiratory failure with hypoxia: Secondary | ICD-10-CM | POA: Diagnosis present

## 2017-03-14 DIAGNOSIS — E1140 Type 2 diabetes mellitus with diabetic neuropathy, unspecified: Secondary | ICD-10-CM | POA: Diagnosis present

## 2017-03-14 DIAGNOSIS — J205 Acute bronchitis due to respiratory syncytial virus: Secondary | ICD-10-CM

## 2017-03-14 DIAGNOSIS — I959 Hypotension, unspecified: Secondary | ICD-10-CM | POA: Diagnosis present

## 2017-03-14 DIAGNOSIS — I2510 Atherosclerotic heart disease of native coronary artery without angina pectoris: Secondary | ICD-10-CM | POA: Diagnosis present

## 2017-03-14 DIAGNOSIS — E1122 Type 2 diabetes mellitus with diabetic chronic kidney disease: Secondary | ICD-10-CM | POA: Diagnosis present

## 2017-03-14 DIAGNOSIS — Z951 Presence of aortocoronary bypass graft: Secondary | ICD-10-CM | POA: Diagnosis not present

## 2017-03-14 DIAGNOSIS — R0603 Acute respiratory distress: Secondary | ICD-10-CM | POA: Diagnosis not present

## 2017-03-14 DIAGNOSIS — Z87891 Personal history of nicotine dependence: Secondary | ICD-10-CM | POA: Diagnosis not present

## 2017-03-14 LAB — BASIC METABOLIC PANEL
ANION GAP: 10 (ref 5–15)
BUN: 20 mg/dL (ref 6–20)
CHLORIDE: 106 mmol/L (ref 101–111)
CO2: 21 mmol/L — ABNORMAL LOW (ref 22–32)
Calcium: 8.9 mg/dL (ref 8.9–10.3)
Creatinine, Ser: 1.47 mg/dL — ABNORMAL HIGH (ref 0.61–1.24)
GFR calc non Af Amer: 38 mL/min — ABNORMAL LOW (ref >60)
GFR, EST AFRICAN AMERICAN: 45 mL/min — AB (ref >60)
Glucose, Bld: 174 mg/dL — ABNORMAL HIGH (ref 65–99)
POTASSIUM: 4.3 mmol/L (ref 3.5–5.1)
Sodium: 137 mmol/L (ref 135–145)

## 2017-03-14 LAB — CBC
HEMATOCRIT: 39.7 % (ref 39.0–52.0)
HEMOGLOBIN: 12.8 g/dL — AB (ref 13.0–17.0)
MCH: 30.3 pg (ref 26.0–34.0)
MCHC: 32.2 g/dL (ref 30.0–36.0)
MCV: 94.1 fL (ref 78.0–100.0)
PLATELETS: 168 10*3/uL (ref 150–400)
RBC: 4.22 MIL/uL (ref 4.22–5.81)
RDW: 13.7 % (ref 11.5–15.5)
WBC: 8.5 10*3/uL (ref 4.0–10.5)

## 2017-03-14 LAB — INFLUENZA PANEL BY PCR (TYPE A & B)
Influenza A By PCR: NEGATIVE
Influenza B By PCR: NEGATIVE

## 2017-03-14 LAB — I-STAT TROPONIN, ED: Troponin i, poc: 0.02 ng/mL (ref 0.00–0.08)

## 2017-03-14 LAB — BRAIN NATRIURETIC PEPTIDE: B NATRIURETIC PEPTIDE 5: 447.9 pg/mL — AB (ref 0.0–100.0)

## 2017-03-14 MED ORDER — PANTOPRAZOLE SODIUM 40 MG PO TBEC
40.0000 mg | DELAYED_RELEASE_TABLET | Freq: Every day | ORAL | Status: DC
  Administered 2017-03-15 – 2017-03-19 (×5): 40 mg via ORAL
  Filled 2017-03-14 (×6): qty 1

## 2017-03-14 MED ORDER — SODIUM CHLORIDE 0.9 % IV SOLN: 250.0000 mL | INTRAVENOUS | Status: DC | PRN

## 2017-03-14 MED ORDER — ACETAMINOPHEN 325 MG PO TABS: 650.0000 mg | ORAL_TABLET | Freq: Four times a day (QID) | ORAL | Status: DC | PRN

## 2017-03-14 MED ORDER — POLYVINYL ALCOHOL 1.4 % OP SOLN: 1.0000 [drp] | Freq: Three times a day (TID) | OPHTHALMIC | Status: DC | PRN

## 2017-03-14 MED ORDER — INSULIN ASPART 100 UNIT/ML ~~LOC~~ SOLN
0.0000 [IU] | Freq: Three times a day (TID) | SUBCUTANEOUS | Status: DC
  Administered 2017-03-15: 1 [IU] via SUBCUTANEOUS
  Administered 2017-03-15 – 2017-03-16 (×5): 2 [IU] via SUBCUTANEOUS
  Administered 2017-03-17: 1 [IU] via SUBCUTANEOUS
  Administered 2017-03-17: 2 [IU] via SUBCUTANEOUS
  Administered 2017-03-17 – 2017-03-18 (×2): 5 [IU] via SUBCUTANEOUS
  Administered 2017-03-18: 3 [IU] via SUBCUTANEOUS
  Administered 2017-03-18: 2 [IU] via SUBCUTANEOUS
  Administered 2017-03-19: 3 [IU] via SUBCUTANEOUS
  Administered 2017-03-19: 2 [IU] via SUBCUTANEOUS

## 2017-03-14 MED ORDER — HYDROCODONE-ACETAMINOPHEN 5-325 MG PO TABS: 1.0000 | ORAL_TABLET | ORAL | Status: DC | PRN

## 2017-03-14 MED ORDER — IOPAMIDOL (ISOVUE-370) INJECTION 76%
INTRAVENOUS | Status: AC
  Administered 2017-03-14: 100 mL
  Filled 2017-03-14: qty 100

## 2017-03-14 MED ORDER — ALBUTEROL SULFATE (2.5 MG/3ML) 0.083% IN NEBU
2.5000 mg | INHALATION_SOLUTION | RESPIRATORY_TRACT | Status: DC | PRN
  Administered 2017-03-14: 2.5 mg via RESPIRATORY_TRACT
  Filled 2017-03-14: qty 3

## 2017-03-14 MED ORDER — ADULT MULTIVITAMIN W/MINERALS CH
1.0000 | ORAL_TABLET | Freq: Every day | ORAL | Status: DC
  Administered 2017-03-15 – 2017-03-19 (×5): 1 via ORAL
  Filled 2017-03-14 (×5): qty 1

## 2017-03-14 MED ORDER — SIMVASTATIN 20 MG PO TABS
20.0000 mg | ORAL_TABLET | Freq: Every day | ORAL | Status: DC
  Administered 2017-03-15 – 2017-03-18 (×4): 20 mg via ORAL
  Filled 2017-03-14 (×4): qty 1

## 2017-03-14 MED ORDER — ONDANSETRON HCL 4 MG/2ML IJ SOLN: 4.0000 mg | Freq: Four times a day (QID) | INTRAMUSCULAR | Status: DC | PRN

## 2017-03-14 MED ORDER — LOSARTAN POTASSIUM 50 MG PO TABS
100.0000 mg | ORAL_TABLET | Freq: Every day | ORAL | Status: DC
  Administered 2017-03-15: 100 mg via ORAL
  Filled 2017-03-14: qty 2

## 2017-03-14 MED ORDER — ACETAMINOPHEN 650 MG RE SUPP: 650.0000 mg | Freq: Four times a day (QID) | RECTAL | Status: DC | PRN

## 2017-03-14 MED ORDER — SODIUM CHLORIDE 0.9% FLUSH
3.0000 mL | Freq: Two times a day (BID) | INTRAVENOUS | Status: DC
  Administered 2017-03-14 – 2017-03-19 (×10): 3 mL via INTRAVENOUS

## 2017-03-14 MED ORDER — ALBUTEROL (5 MG/ML) CONTINUOUS INHALATION SOLN
10.0000 mg/h | INHALATION_SOLUTION | RESPIRATORY_TRACT | Status: DC
  Administered 2017-03-14: 10 mg/h via RESPIRATORY_TRACT
  Filled 2017-03-14: qty 20

## 2017-03-14 MED ORDER — LATANOPROST 0.005 % OP SOLN
1.0000 [drp] | Freq: Every day | OPHTHALMIC | Status: DC
  Filled 2017-03-14: qty 2.5

## 2017-03-14 MED ORDER — SODIUM CHLORIDE 0.9% FLUSH
3.0000 mL | INTRAVENOUS | Status: DC | PRN
  Administered 2017-03-17: 3 mL via INTRAVENOUS
  Filled 2017-03-14: qty 3

## 2017-03-14 MED ORDER — SODIUM CHLORIDE 0.9% FLUSH
3.0000 mL | Freq: Two times a day (BID) | INTRAVENOUS | Status: DC
  Administered 2017-03-15 – 2017-03-16 (×3): 3 mL via INTRAVENOUS

## 2017-03-14 MED ORDER — INSULIN ASPART 100 UNIT/ML ~~LOC~~ SOLN
0.0000 [IU] | Freq: Every day | SUBCUTANEOUS | Status: DC
  Administered 2017-03-17: 2 [IU] via SUBCUTANEOUS

## 2017-03-14 MED ORDER — METHYLPREDNISOLONE SODIUM SUCC 40 MG IJ SOLR
40.0000 mg | Freq: Three times a day (TID) | INTRAMUSCULAR | Status: DC
  Administered 2017-03-14 – 2017-03-19 (×14): 40 mg via INTRAVENOUS
  Filled 2017-03-14 (×13): qty 1

## 2017-03-14 MED ORDER — HEPARIN SODIUM (PORCINE) 5000 UNIT/ML IJ SOLN
5000.0000 [IU] | Freq: Three times a day (TID) | INTRAMUSCULAR | Status: DC
  Administered 2017-03-14 – 2017-03-16 (×5): 5000 [IU] via SUBCUTANEOUS
  Filled 2017-03-14 (×5): qty 1

## 2017-03-14 MED ORDER — ONDANSETRON HCL 4 MG PO TABS: 4.0000 mg | ORAL_TABLET | Freq: Four times a day (QID) | ORAL | Status: DC | PRN

## 2017-03-14 MED ORDER — DOCUSATE SODIUM 100 MG PO CAPS: 100.0000 mg | ORAL_CAPSULE | Freq: Two times a day (BID) | ORAL | Status: DC | PRN

## 2017-03-14 MED ORDER — MAGNESIUM OXIDE 400 (241.3 MG) MG PO TABS
400.0000 mg | ORAL_TABLET | Freq: Every day | ORAL | Status: DC
  Administered 2017-03-15 – 2017-03-19 (×5): 400 mg via ORAL
  Filled 2017-03-14 (×5): qty 1

## 2017-03-14 NOTE — ED Notes (Signed)
Pt off bipap, placed on 3L [place]. Requesting to wash his dentures out at the sink

## 2017-03-14 NOTE — ED Notes (Signed)
Respiratory called for neb tx 

## 2017-03-14 NOTE — ED Triage Notes (Addendum)
Per EMS- pt here for evaluation of respiratory distress. Pt has had as cough, was prescribed Mucinex and Prednisone yesterday. Pt has hx of CHF. Today began experiencing difficulty breathing. Pt has audible crackles. Pt does not take diuretics. Pt was given 10 albuterol, .5 atrovent,. 125 solumedrol and .4 nitro. Placed on CPAP. Pt reported noted improvement of respiratory effort while on CPAP. RT called upon pt arrival.

## 2017-03-14 NOTE — ED Provider Notes (Signed)
Medical screening examination/treatment/procedure(s) were conducted as a shared visit with non-physician practitioner(s) and myself.  I personally evaluated the patient during the encounter.  Clinical Impression:   Final diagnoses:  Respiratory distress    The patient is a 84 year old male who presents with acute respiratory distress with increased work of breathing wheezing, coughing up lots of phlegm, states this has been going on for several days and gradually worsening.  The patient has on exam required BiPAP, he has been taken off of this after a continuous nebulizer and is bringing up lots of phlegm, his oxygen dropped to 93% on his nasal cannula, he will likely need to be admitted to high level of care.  CT scan pending to rule out other causes of the patient's acute dyspnea.  I discussed care with Dr. Myna Hidalgo who will admit pt to the hospital   Noemi Chapel, MD 03/18/17 [phone]

## 2017-03-14 NOTE — ED Notes (Signed)
Pt requesting to remove Bipap, stating it is hurting his nose. Encouraged pt to keep Bipap on

## 2017-03-14 NOTE — H&P (Signed)
History and Physical    Lawrence Warner NKN:[id number] DOB: 1918/03/21 DOA: 03/14/2017  PCP: Clovia Cuff, MD   Patient coming from: Home  Chief Complaint: Respiratory distress  HPI: Lawrence Warner is a 84 y.o. male with medical history significant for coronary artery disease status post remote CABG, chronic kidney disease stage III, and hypertension, now presenting to the emergency department for evaluation of respiratory distress.  Patient reports developing upper respiratory symptoms approximately a week ago with progressively worsening dyspnea since that time.  He was evaluated yesterday and prescribed Mucinex and a steroid.  His shortness of breath has worsened significantly over the past day and he arrives in acute respiratory distress.  He denies chest pain or palpitations and denies orthopnea, increased leg swelling, or lower extremity tenderness.  He was treated with nebs and 125 mg of IV Solu-Medrol with EMS prior to arrival.  ED Course: Upon arrival to the ED, patient is found to be afebrile, in acute respiratory distress, and with stable blood pressure.  EKG features a sinus rhythm with PVCs, LVH, and nonspecific IVCD.  Chest x-rays negative for acute cardiopulmonary disease and CTA chest is negative for PE, and also negative for acute cardiopulmonary disease.  Chemistry panel features a serum creatinine of 1.47, consistent with his apparent baseline.  CBC is unremarkable, troponin is within the normal limits, and BNP is elevated to 448.  Patient was started on BiPAP and treated with albuterol neb.  Influenza testing has been ordered but not yet collected.  Patient has improved with the measures taken in the ED, but desaturates into the 80s on room air and continues to have labored respirations while at rest.  He will be admitted to the stepdown unit for ongoing evaluation and management of acute hypoxic respiratory failure with bronchospasm, suspected secondary to a viral respiratory  illness.  Review of Systems:  All other systems reviewed and apart from HPI, are negative.  Past Medical History:  Diagnosis Date  . BPH (benign prostatic hypertrophy)   . Chronic heart failure ([place])   . Chronic renal insufficiency   . Coronary artery disease    CABG in 2000  . Diabetes type 2, controlled ([place])    Diet-controlled  . Diabetic neuropathy ([place])   . Dyslipidemia   . Hypertension   . ITP (idiopathic thrombocytopenic purpura)   . Osteoarthritis   . Pneumonia     Past Surgical History:  Procedure Laterality Date  . CHOLECYSTECTOMY    . CORONARY ARTERY BYPASS GRAFT  2000  . Right hip replacement  2014  . TONSILLECTOMY       reports that he has quit smoking. He has a 50.00 pack-year smoking history. he has never used smokeless tobacco. He reports that he does not drink alcohol or use drugs.  Allergies  Allergen Reactions  . Alfuzosin Hcl Er Other (See Comments)    Confusion/Uroxatral   . Imdur [Isosorbide Dinitrate]     REACTION NOT RECALLED  . Penicillins Hives and Other (See Comments)    Has patient had a PCN reaction causing immediate rash, facial/tongue/throat swelling, SOB or lightheadedness with hypotension: Yes Has patient had a PCN reaction causing severe rash involving mucus membranes or skin necrosis: Unknown Has patient had a PCN reaction that required hospitalization: Unknown Has patient had a PCN reaction occurring within the last 10 years: Unknown If all of the above answers are "NO", then may proceed with Cephalosporin use.   . Tape     SKIN IS VERY THIN  AND WILL TEAR EASILY    Family History  Problem Relation Age of Onset  . Lung disease Father   . Cancer Brother      Prior to Admission medications   Medication Sig Start Date End Date Taking? Authorizing Provider  acetaminophen (TYLENOL) 500 MG tablet Take 500-1,000 mg by mouth daily as needed for mild pain or headache.    Yes [provider]  albuterol (PROAIR HFA) 108 (90  Base) MCG/ACT inhaler every 6 (six) hours as needed.   Yes [provider]  Azithromycin (ZITHROMAX Z-PAK PO)    Yes [provider]  Dextran 70-Hypromellose (ARTIFICIAL TEARS PF OP) Apply to eye.   Yes [provider]  docusate sodium (COLACE) 100 MG capsule Take 1 capsule (100 mg total) every 12 (twelve) hours by mouth. Patient taking differently: Take 100 mg by mouth 2 (two) times daily as needed for mild constipation.  01/01/17  Yes Long, Wonda Olds, MD  latanoprost (XALATAN) 0.005 % ophthalmic solution Place 1 drop into the left eye at bedtime.   Yes [provider]  methylPREDNISolone (MEDROL DOSEPAK) 4 MG TBPK tablet    Yes [provider]  fluticasone (FLONASE) 50 MCG/ACT nasal spray Place 2 sprays into both nostrils 2 (two) times daily. For 7days Patient not taking: Reported on 03/14/2017 07/26/16   Domenic Polite, MD  hydroxypropyl methylcellulose / hypromellose (ISOPTO TEARS / GONIOVISC) 2.5 % ophthalmic solution Place 1 drop into both eyes 3 (three) times daily as needed for dry eyes.    [provider]  losartan (COZAAR) 100 MG tablet Take 100 mg by mouth daily.    [provider]  magnesium oxide (MAG-OX) 400 MG tablet Take 400 mg daily by mouth.    [provider]  Multiple Vitamin (MULTIVITAMIN WITH MINERALS) TABS tablet Take 1 tablet by mouth daily.    [provider]  nitroGLYCERIN (NITROSTAT) 0.4 MG SL tablet Place 1 tablet (0.4 mg total) under the tongue every 5 (five) minutes as needed for chest pain. 12/13/15 03/14/17  Minus Breeding, MD  omeprazole (PRILOSEC) 20 MG capsule Take 1 capsule (20 mg total) by mouth 2 (two) times daily before a meal. Patient taking differently: Take 20 mg by mouth daily before breakfast.  07/26/16   Domenic Polite, MD  simvastatin (ZOCOR) 20 MG tablet Take 20 mg by mouth at bedtime.    [provider]    Physical Exam: Vitals:   03/14/17 1730 03/14/17 1800  03/14/17 2030 03/14/17 2100  BP: (!) 131/43 (!) 130/51 (!) 122/38 139/77  Pulse: (!) 57  100 96  Resp: (!) 22 (!) 33 (!) 27 (!) 22  Temp:      TempSrc:      SpO2: 97%  94% (!) 86%      Constitutional: in acute respiratory distress with accessory muscle use at rest, no pallor, no diaphoresis Eyes: PERTLA, lids and conjunctivae normal ENMT: Mucous membranes are moist. Posterior pharynx clear of any exudate or lesions.   Neck: normal, supple, no masses, no thyromegaly Respiratory: Tachypnea, dyspnea with speech, increased WOB. Diffuse wheezes. No pallor or cyanosis.   Cardiovascular: S1 & S2 heard, regular rate and rhythm. No significant JVD. Abdomen: No distension, no tenderness, no masses palpated. Bowel sounds normal.  Musculoskeletal: no clubbing / cyanosis. No joint deformity upper and lower extremities.    Skin: no significant rashes, lesions, ulcers. Warm, dry, well-perfused. Neurologic: CN 2-12 grossly intact. Sensation intact. Strength 5/5 in all 4 limbs.  Psychiatric: Alert and oriented x 3. Pleasant and cooperative.     Labs on Admission: I have personally reviewed following labs and imaging studies  CBC: Recent Labs  Lab 03/11/17 0918 03/14/17 1526  WBC 6.7 8.5  NEUTROABS 4.8  --   HGB  --  12.8*  HCT 39.5 39.7  MCV 94.0 94.1  PLT 195 176   Basic Metabolic Panel: Recent Labs  Lab 03/14/17 1526  NA 137  K 4.3  CL 106  CO2 21*  GLUCOSE 174*  BUN 20  CREATININE 1.47*  CALCIUM 8.9   GFR: CrCl cannot be calculated (Unknown ideal weight.). Liver Function Tests: No results for input(s): AST, ALT, ALKPHOS, BILITOT, PROT, ALBUMIN in the last 168 hours. No results for input(s): LIPASE, AMYLASE in the last 168 hours. No results for input(s): AMMONIA in the last 168 hours. Coagulation Profile: No results for input(s): INR, PROTIME in the last 168 hours. Cardiac Enzymes: No results for input(s): CKTOTAL, CKMB, CKMBINDEX, TROPONINI in the last 168 hours. BNP  (last 3 results) No results for input(s): PROBNP in the last 8760 hours. HbA1C: No results for input(s): HGBA1C in the last 72 hours. CBG: No results for input(s): GLUCAP in the last 168 hours. Lipid Profile: No results for input(s): CHOL, HDL, LDLCALC, TRIG, CHOLHDL, LDLDIRECT in the last 72 hours. Thyroid Function Tests: No results for input(s): TSH, T4TOTAL, FREET4, T3FREE, THYROIDAB in the last 72 hours. Anemia Panel: No results for input(s): VITAMINB12, FOLATE, FERRITIN, TIBC, IRON, RETICCTPCT in the last 72 hours. Urine analysis:    Component Value Date/Time   COLORURINE YELLOW 07/27/2016 1702   APPEARANCEUR CLEAR 07/27/2016 1702   LABSPEC 1.019 07/27/2016 1702   PHURINE 5.0 07/27/2016 1702   GLUCOSEU NEGATIVE 07/27/2016 1702   HGBUR NEGATIVE 07/27/2016 1702   BILIRUBINUR NEGATIVE 07/27/2016 1702   KETONESUR NEGATIVE 07/27/2016 1702   PROTEINUR NEGATIVE 07/27/2016 1702   NITRITE NEGATIVE 07/27/2016 1702   LEUKOCYTESUR NEGATIVE 07/27/2016 1702   Sepsis Labs: @LABRCNTIP (procalcitonin:4,lacticidven:4) )No results found for this or any previous visit (from the past 240 hour(s)).   Radiological Exams on Admission: Ct Angio Chest Pe W/cm &/or Wo Cm  Result Date: 03/14/2017 CLINICAL DATA:  Dyspnea short of breath. Suspect pulmonary embolism. Negative D-dimer EXAM: CT ANGIOGRAPHY CHEST WITH CONTRAST TECHNIQUE: Multidetector CT imaging of the chest was performed using the standard protocol during bolus administration of intravenous contrast. Multiplanar CT image reconstructions and MIPs were obtained to evaluate the vascular anatomy. CONTRAST:  146mL ISOVUE-370 IOPAMIDOL (ISOVUE-370) INJECTION 76% COMPARISON:  03/14/2017, CT 07/22/2016 FINDINGS: Cardiovascular: Negative for pulmonary embolism. Pulmonary arteries normal in caliber. Mild atherosclerotic disease aortic arch without aneurysm. Heart size within normal limits. Prior CABG. Mild left ventricular enlargement. Mediastinum/Nodes:  Negative for mass or adenopathy. Metal wire in the subcarinal region presumably related to prior median sternotomy. This is unchanged from the prior study. Lungs/Pleura: Negative for infiltrate or effusion. 8 mm sub solid nodule right lower lobe is unchanged. 8 mm solid nodule right middle lobe unchanged. Negative for pneumonia. Upper Abdomen: No acute abnormality Musculoskeletal: No acute skeletal abnormality. Ankylosing spondylitis. Review of the MIP images confirms the above findings. IMPRESSION: Negative for pulmonary embolism.  No acute abnormality in the chest. Electronically Signed   By: Franchot Gallo M.D.   On: 03/14/2017 18:56   Dg Chest Portable 1 View  Result Date: 03/14/2017 CLINICAL DATA:  Shortness of breath. EXAM: PORTABLE CHEST 1 VIEW COMPARISON:  PA and lateral chest 02/25/2016 and 01/01/2017. FINDINGS: The patient  is status post CABG. Heart size is upper normal. Lungs are clear. No pneumothorax or pleural effusion. IMPRESSION: No acute disease. Electronically Signed   By: Inge Rise M.D.   On: 03/14/2017 15:34    EKG: Independently reviewed. Sinus rhythm, PVC's, non-specific IVCD, LVH.   Assessment/Plan  1. URI with bronchospasm; acute hypoxic respiratory failure - Presents with respiratory distress and hypoxia despite albuterol, steroid, and Mucinex started day prior to admission  - CXR negative for acute disease, CTA chest neg for PE and also negative for any acute cardiopulmonary disease  - There is diffuse wheezing on arrival and pt reports recent URI sxs  - Suspect an acute viral illness with bronchospasm  - Started on BiPAP in ED, treated with nebs, given 125 mg IV Solu-Medrol by EMS  - Influenza PCR pending, continue prn BiPAP, supplemental O2, check sputum culture, continue systemic steroid, nebs  2. CKD stage III  - SCr is 1.47 on admission, consistent with his apparent baseline  - Renally-dose medications, avoid nephrotoxins    3. Hypertension  - BP at goal    - Continue losartan   4. Type II DM  - A1c was 8.3% in June 2018  - Diet-controlled at home - Pt will be on systemic steroid  - Follow CBG's and start SSI with Novolog    DVT prophylaxis: sq heparin   Code Status: DNR  Family Communication: Discussed with patient Disposition Plan: Admit to SDU Consults called: None Admission status: Inpatient    Vianne Bulls, MD [facility] Pager [phone]  If 7PM-7AM, please contact night-coverage [email or web address] Password [facility]  03/14/2017, 9:32 PM

## 2017-03-14 NOTE — ED Notes (Signed)
Pt placed back on bipap

## 2017-03-14 NOTE — ED Notes (Signed)
Pt placed back on Bipap 

## 2017-03-14 NOTE — ED Notes (Signed)
DNR band placed on pt. °

## 2017-03-14 NOTE — ED Notes (Signed)
Pt's son Coury Grieger) contact info: [phone] Please call with updates if possible

## 2017-03-15 ENCOUNTER — Other Ambulatory Visit: Payer: Self-pay

## 2017-03-15 DIAGNOSIS — J209 Acute bronchitis, unspecified: Secondary | ICD-10-CM

## 2017-03-15 DIAGNOSIS — J9601 Acute respiratory failure with hypoxia: Secondary | ICD-10-CM

## 2017-03-15 DIAGNOSIS — I10 Essential (primary) hypertension: Secondary | ICD-10-CM

## 2017-03-15 LAB — BASIC METABOLIC PANEL
ANION GAP: 12 (ref 5–15)
BUN: 28 mg/dL — ABNORMAL HIGH (ref 6–20)
CO2: 21 mmol/L — ABNORMAL LOW (ref 22–32)
Calcium: 9.2 mg/dL (ref 8.9–10.3)
Chloride: 103 mmol/L (ref 101–111)
Creatinine, Ser: 1.58 mg/dL — ABNORMAL HIGH (ref 0.61–1.24)
GFR calc Af Amer: 41 mL/min — ABNORMAL LOW (ref >60)
GFR, EST NON AFRICAN AMERICAN: 35 mL/min — AB (ref >60)
Glucose, Bld: 203 mg/dL — ABNORMAL HIGH (ref 65–99)
POTASSIUM: 4.1 mmol/L (ref 3.5–5.1)
SODIUM: 136 mmol/L (ref 135–145)

## 2017-03-15 LAB — RESPIRATORY PANEL BY PCR
Adenovirus: NOT DETECTED
Bordetella pertussis: NOT DETECTED
CHLAMYDOPHILA PNEUMONIAE-RVPPCR: NOT DETECTED
Coronavirus 229E: NOT DETECTED
Coronavirus HKU1: NOT DETECTED
Coronavirus NL63: NOT DETECTED
Coronavirus OC43: NOT DETECTED
INFLUENZA A-RVPPCR: NOT DETECTED
Influenza B: NOT DETECTED
Metapneumovirus: NOT DETECTED
Mycoplasma pneumoniae: NOT DETECTED
PARAINFLUENZA VIRUS 3-RVPPCR: NOT DETECTED
PARAINFLUENZA VIRUS 4-RVPPCR: NOT DETECTED
Parainfluenza Virus 1: NOT DETECTED
Parainfluenza Virus 2: NOT DETECTED
RHINOVIRUS / ENTEROVIRUS - RVPPCR: NOT DETECTED
Respiratory Syncytial Virus: DETECTED — AB

## 2017-03-15 LAB — GLUCOSE, CAPILLARY
GLUCOSE-CAPILLARY: 164 mg/dL — AB (ref 65–99)
Glucose-Capillary: 202 mg/dL — ABNORMAL HIGH (ref 65–99)
Glucose-Capillary: 174 mg/dL — ABNORMAL HIGH (ref 65–99)
Glucose-Capillary: 149 mg/dL — ABNORMAL HIGH (ref 65–99)
Glucose-Capillary: 163 mg/dL — ABNORMAL HIGH (ref 65–99)

## 2017-03-15 LAB — TSH: TSH: 0.324 u[IU]/mL — AB (ref 0.350–4.500)

## 2017-03-15 LAB — EXPECTORATED SPUTUM ASSESSMENT W REFEX TO RESP CULTURE

## 2017-03-15 LAB — MRSA PCR SCREENING: MRSA by PCR: NEGATIVE

## 2017-03-15 LAB — EXPECTORATED SPUTUM ASSESSMENT W GRAM STAIN, RFLX TO RESP C

## 2017-03-15 MED ORDER — DM-GUAIFENESIN ER 30-600 MG PO TB12
1.0000 | ORAL_TABLET | Freq: Two times a day (BID) | ORAL | Status: DC
  Administered 2017-03-15 – 2017-03-19 (×9): 1 via ORAL
  Filled 2017-03-15 (×9): qty 1

## 2017-03-15 MED ORDER — POLYVINYL ALCOHOL 1.4 % OP SOLN
1.0000 [drp] | Freq: Four times a day (QID) | OPHTHALMIC | Status: DC
  Administered 2017-03-15 – 2017-03-16 (×3): 1 [drp] via OPHTHALMIC
  Filled 2017-03-15: qty 15

## 2017-03-15 NOTE — Plan of Care (Signed)
Discussed with patient during shift change importance of keeping oxygen in his nose with no teach back displayed.  Patients states "I am a mouth breather and have been doing this for years."  Will continue to educate.

## 2017-03-15 NOTE — Evaluation (Signed)
Physical Therapy Evaluation Patient Details Name: Lawrence Warner MRN: [id number] DOB: [birth year] Today's Date: 03/15/2017   History of Present Illness  Pt is a 84 y/o male admitted secondary to acute respiratory failure, likely acute bronchitis viral with bronchospasms. PMH including but not limited to DM, HTN, CKD, bradycardia, CHF, CAD s/p CABG in 2000 and R THA in 2014.    Clinical Impression  Pt presented supine in bed with HOB elevated, awake and willing to participate in therapy session. Prior to admission, pt reported that he ambulated with use of SPC and was independent with ADLs. Pt lives in an [place]. Pt currently requires min guard for bed mobility and min guard to min A for stability with transfers with use of RW. Pt on RA throughout with SPO2 maintaining >90%. Next session will assess gait and higher level balance if pt is able to tolerate. Pt would continue to benefit from skilled physical therapy services at this time while admitted and after d/c to address the below listed limitations in order to improve overall safety and independence with functional mobility.     Follow Up Recommendations Home health PT    Equipment Recommendations  None recommended by PT    Recommendations for Other Services       Precautions / Restrictions Precautions Precaution Comments: watch HR and SPO2 Restrictions Weight Bearing Restrictions: No      Mobility  Bed Mobility Overal bed mobility: Needs Assistance Bed Mobility: Supine to Sit     Supine to sit: Min guard     General bed mobility comments: min guard for safety  Transfers Overall transfer level: Needs assistance Equipment used: Rolling walker (2 wheeled) Transfers: Sit to/from [facility] Sit to Stand: Min guard Stand pivot transfers: Min assist       General transfer comment: increased time and effort, min A for stability with transitional movement to chair  Ambulation/Gait                 Stairs            Wheelchair Mobility    Modified Rankin (Stroke Patients Only)       Balance Overall balance assessment: Needs assistance Sitting-balance support: Feet supported Sitting balance-Leahy Scale: Good     Standing balance support: During functional activity;Bilateral upper extremity supported Standing balance-Leahy Scale: Poor                               Pertinent Vitals/Pain Pain Assessment: No/denies pain    Home Living Family/patient expects to be discharged to:: Private residence Living Arrangements: Alone Available Help at Discharge: Available PRN/intermittently;Family Type of Home: Independent living facility Home Access: Level entry;Elevator     Home Layout: One level Home Equipment: [place] - 4 wheels;Walker - 2 wheels;Cane - single point Additional Comments: states that he gets his meals provided and light housekeeping, but no other assistance    Prior Function Level of Independence: Independent with assistive device(s)         Comments: using SPC for ambulation     Hand Dominance        Extremity/Trunk Assessment   Upper Extremity Assessment Upper Extremity Assessment: Overall WFL for tasks assessed    Lower Extremity Assessment Lower Extremity Assessment: Overall WFL for tasks assessed       Communication   Communication: No difficulties  Cognition Arousal/Alertness: Awake/alert Behavior During Therapy: WFL for tasks assessed/performed Overall Cognitive Status: Within Functional  Limits for tasks assessed                                        General Comments      Exercises     Assessment/Plan    PT Assessment Patient needs continued PT services  PT Problem List Decreased balance;Decreased mobility;Decreased coordination;Decreased safety awareness;Cardiopulmonary status limiting activity;Decreased knowledge of precautions       PT Treatment Interventions DME instruction;Gait  training;Stair training;Functional mobility training;Therapeutic activities;Therapeutic exercise;Balance training;Neuromuscular re-education;Patient/family education    PT Goals (Current goals can be found in the Care Plan section)  Acute Rehab PT Goals Patient Stated Goal: return home PT Goal Formulation: With patient Time For Goal Achievement: 03/29/17 Potential to Achieve Goals: Good    Frequency Min 3X/week   Barriers to discharge        Co-evaluation               AM-PAC PT "6 Clicks" Daily Activity  Outcome Measure Difficulty turning over in bed (including adjusting bedclothes, sheets and blankets)?: None Difficulty moving from lying on back to sitting on the side of the bed? : A Little Difficulty sitting down on and standing up from a chair with arms (e.g., wheelchair, bedside commode, etc,.)?: Unable Help needed moving to and from a bed to chair (including a wheelchair)?: A Little Help needed walking in hospital room?: A Little Help needed climbing 3-5 steps with a railing? : A Lot 6 Click Score: 16    End of Session   Activity Tolerance: Patient tolerated treatment well Patient left: in chair;with call bell/phone within reach;with chair alarm set Nurse Communication: Mobility status PT Visit Diagnosis: Other abnormalities of gait and mobility (R26.89)    Time: 6767-2094 PT Time Calculation (min) (ACUTE ONLY): 17 min   Charges:   PT Evaluation $PT Eval Moderate Complexity: 1 Mod     PT G Codes:        [place], PT, DPT [place] 03/15/2017, 4:43 PM

## 2017-03-15 NOTE — Progress Notes (Signed)
Took pt off BIPAP and placed on 3L [place].  Pt doing well

## 2017-03-15 NOTE — ED Provider Notes (Signed)
[facility] Provider Note   CSN: [id number] Arrival date & time: 03/14/17  1514     History   Chief Complaint Chief Complaint  Patient presents with  . Congestive Heart Failure  . Respiratory Distress    HPI Lawrence Warner is a 84 y.o. male.  HPI Patient presents to the emergency department with shortness of breath that started right after eating lunch today.  The patient states that he has had a recent cough and upper respiratory symptoms over the last 2 days.  He states that he was placed on antibiotics and inhaler and steroids.  Patient states that nothing seemed to make the condition better.  He felt like activity made his shortness of breath worse.  Patient states that he did not take any medications prior to arrival.  The patient denies chest pain, headache,blurred vision, neck pain, fever, cough, weakness, numbness, dizziness, anorexia, edema, abdominal pain, nausea, vomiting, diarrhea, rash, back pain, dysuria, hematemesis, bloody stool, near syncope, or syncope. Past Medical History:  Diagnosis Date  . BPH (benign prostatic hypertrophy)   . Chronic heart failure ([place])   . Chronic renal insufficiency   . Coronary artery disease    CABG in 2000  . Diabetes type 2, controlled ([place])    Diet-controlled  . Diabetic neuropathy ([place])   . Dyslipidemia   . Hypertension   . ITP (idiopathic thrombocytopenic purpura)   . Osteoarthritis   . Pneumonia     Patient Active Problem List   Diagnosis Date Noted  . Bronchospasm with bronchitis, acute 03/14/2017  . Diastolic congestive heart failure ([place]) 11/26/2016  . CRI (chronic renal insufficiency), stage 3 (moderate) ([place]) 11/26/2016  . LBBB (left bundle branch block) 11/26/2016  . Hypoxia   . Acute respiratory failure with hypoxia ([place]) 07/20/2016  . Respiratory failure ([place]) 02/28/2016  . Acute respiratory failure with hypoxemia ([place]) 02/28/2016  . Dyslipidemia 02/28/2016  . AKI (acute kidney injury)  ([place])   . Influenza   . Sepsis ([place])   . Constipation 12/07/2015  . Bradycardia 11/28/2015  . Port catheter in place 10/24/2015  . GERD (gastroesophageal reflux disease) 10/09/2015  . Gait instability 10/09/2015  . Chest pain 10/05/2015  . Diet-controlled diabetes mellitus ([place]) 05/07/2015  . Hx of CABG 05/07/2015  . Essential hypertension 05/07/2015  . Muscle cramps 04/25/2015  . Shortness of breath 04/11/2015  . Bronchitis 03/14/2015  . Idiopathic thrombocytopenic purpura ([place]) 09/27/2014    Past Surgical History:  Procedure Laterality Date  . CHOLECYSTECTOMY    . CORONARY ARTERY BYPASS GRAFT  2000  . Right hip replacement  2014  . TONSILLECTOMY         Home Medications    Prior to Admission medications   Medication Sig Start Date End Date Taking? Authorizing Provider  acetaminophen (TYLENOL) 500 MG tablet Take 500-1,000 mg by mouth daily as needed for mild pain or headache.    Yes [provider]  albuterol (PROAIR HFA) 108 (90 Base) MCG/ACT inhaler every 6 (six) hours as needed.   Yes [provider]  Azithromycin (ZITHROMAX Z-PAK PO)    Yes [provider]  Dextran 70-Hypromellose (ARTIFICIAL TEARS PF OP) Apply to eye.   Yes [provider]  docusate sodium (COLACE) 100 MG capsule Take 1 capsule (100 mg total) every 12 (twelve) hours by mouth. Patient taking differently: Take 100 mg by mouth 2 (two) times daily as needed for mild constipation.  01/01/17  Yes Long, Wonda Olds, MD  latanoprost (  XALATAN) 0.005 % ophthalmic solution Place 1 drop into the left eye at bedtime.   Yes [provider]  methylPREDNISolone (MEDROL DOSEPAK) 4 MG TBPK tablet    Yes [provider]  fluticasone (FLONASE) 50 MCG/ACT nasal spray Place 2 sprays into both nostrils 2 (two) times daily. For 7days Patient not taking: Reported on 03/14/2017 07/26/16   Domenic Polite, MD  hydroxypropyl methylcellulose / hypromellose (ISOPTO TEARS / GONIOVISC)  2.5 % ophthalmic solution Place 1 drop into both eyes 3 (three) times daily as needed for dry eyes.    [provider]  losartan (COZAAR) 100 MG tablet Take 100 mg by mouth daily.    [provider]  magnesium oxide (MAG-OX) 400 MG tablet Take 400 mg daily by mouth.    [provider]  Multiple Vitamin (MULTIVITAMIN WITH MINERALS) TABS tablet Take 1 tablet by mouth daily.    [provider]  nitroGLYCERIN (NITROSTAT) 0.4 MG SL tablet Place 1 tablet (0.4 mg total) under the tongue every 5 (five) minutes as needed for chest pain. 12/13/15 03/14/17  Minus Breeding, MD  omeprazole (PRILOSEC) 20 MG capsule Take 1 capsule (20 mg total) by mouth 2 (two) times daily before a meal. Patient taking differently: Take 20 mg by mouth daily before breakfast.  07/26/16   Domenic Polite, MD  simvastatin (ZOCOR) 20 MG tablet Take 20 mg by mouth at bedtime.    [provider]    Family History Family History  Problem Relation Age of Onset  . Lung disease Father   . Cancer Brother     Social History Social History   Tobacco Use  . Smoking status: Former Smoker    Packs/day: 2.00    Years: 25.00    Pack years: 50.00  . Smokeless tobacco: Never Used  Substance Use Topics  . Alcohol use: No  . Drug use: No     Allergies   Alfuzosin hcl er; Imdur [isosorbide dinitrate]; Penicillins; and Tape   Review of Systems Review of Systems All other systems negative except as documented in the HPI. All pertinent positives and negatives as reviewed in the HPI.  Physical Exam Updated Vital Signs BP 133/63   Pulse 72   Temp 98.2 F (36.8 C) (Oral)   Resp (!) 22   Ht 5\' 7"  (1.702 m)   Wt 92 kg (202 lb 13.2 oz)   SpO2 96%   BMI 31.77 kg/m   Physical Exam  Constitutional: He is oriented to person, place, and time. He appears well-developed and well-nourished. No distress.  HENT:  Head: Normocephalic and atraumatic.  Mouth/Throat: Oropharynx is clear and  moist.  Eyes: Pupils are equal, round, and reactive to light.  Neck: Normal range of motion. Neck supple.  Cardiovascular: Normal rate, regular rhythm and normal heart sounds. Exam reveals no gallop and no friction rub.  No murmur heard. Pulmonary/Chest: No stridor. Tachypnea noted. He is in respiratory distress. He has wheezes. He has rhonchi. He has no rales.  Abdominal: Soft. Bowel sounds are normal. He exhibits no distension. There is no tenderness.  Neurological: He is alert and oriented to person, place, and time. He exhibits normal muscle tone. Coordination normal.  Skin: Skin is warm and dry. Capillary refill takes less than 2 seconds. No rash noted. No erythema.  Psychiatric: He has a normal mood and affect. His behavior is normal.  Nursing note and vitals reviewed.    ED Treatments / Results  Labs (all labs ordered are listed,  but only abnormal results are displayed) Labs Reviewed  BASIC METABOLIC PANEL - Abnormal; Notable for the following components:      Result Value   CO2 21 (*)    Glucose, Bld 174 (*)    Creatinine, Ser 1.47 (*)    GFR calc non Af Amer 38 (*)    GFR calc Af Amer 45 (*)    All other components within normal limits  CBC - Abnormal; Notable for the following components:   Hemoglobin 12.8 (*)    All other components within normal limits  BRAIN NATRIURETIC PEPTIDE - Abnormal; Notable for the following components:   B Natriuretic Peptide 447.9 (*)    All other components within normal limits  CULTURE, EXPECTORATED SPUTUM-ASSESSMENT  INFLUENZA PANEL BY PCR (TYPE A & B)  BASIC METABOLIC PANEL  I-STAT TROPONIN, ED    EKG  EKG Interpretation  Date/Time:  Friday March 14 2017 15:15:01 EST Ventricular Rate:  83 PR Interval:    QRS Duration: 160 QT Interval:  430 QTC Calculation: 506 R Axis:   -9 Text Interpretation:  Sinus rhythm Multiple premature complexes, vent & supraven Borderline prolonged PR interval Probable left atrial enlargement IVCD,  consider atypical RBBB LVH with secondary repolarization abnormality Anterior Q waves, possibly due to LVH since last tracing no significant change Confirmed by Noemi Chapel [phone]) on 03/14/2017 7:03:35 PM       Radiology Ct Angio Chest Pe W/cm &/or Wo Cm  Result Date: 03/14/2017 CLINICAL DATA:  Dyspnea short of breath. Suspect pulmonary embolism. Negative D-dimer EXAM: CT ANGIOGRAPHY CHEST WITH CONTRAST TECHNIQUE: Multidetector CT imaging of the chest was performed using the standard protocol during bolus administration of intravenous contrast. Multiplanar CT image reconstructions and MIPs were obtained to evaluate the vascular anatomy. CONTRAST:  181mL ISOVUE-370 IOPAMIDOL (ISOVUE-370) INJECTION 76% COMPARISON:  03/14/2017, CT 07/22/2016 FINDINGS: Cardiovascular: Negative for pulmonary embolism. Pulmonary arteries normal in caliber. Mild atherosclerotic disease aortic arch without aneurysm. Heart size within normal limits. Prior CABG. Mild left ventricular enlargement. Mediastinum/Nodes: Negative for mass or adenopathy. Metal wire in the subcarinal region presumably related to prior median sternotomy. This is unchanged from the prior study. Lungs/Pleura: Negative for infiltrate or effusion. 8 mm sub solid nodule right lower lobe is unchanged. 8 mm solid nodule right middle lobe unchanged. Negative for pneumonia. Upper Abdomen: No acute abnormality Musculoskeletal: No acute skeletal abnormality. Ankylosing spondylitis. Review of the MIP images confirms the above findings. IMPRESSION: Negative for pulmonary embolism.  No acute abnormality in the chest. Electronically Signed   By: Franchot Gallo M.D.   On: 03/14/2017 18:56   Dg Chest Portable 1 View  Result Date: 03/14/2017 CLINICAL DATA:  Shortness of breath. EXAM: PORTABLE CHEST 1 VIEW COMPARISON:  PA and lateral chest 02/25/2016 and 01/01/2017. FINDINGS: The patient is status post CABG. Heart size is upper normal. Lungs are clear. No pneumothorax or  pleural effusion. IMPRESSION: No acute disease. Electronically Signed   By: Inge Rise M.D.   On: 03/14/2017 15:34    Procedures Procedures (including critical care time)  Medications Ordered in ED Medications  docusate sodium (COLACE) capsule 100 mg (not administered)  polyvinyl alcohol (LIQUIFILM TEARS) 1.4 % ophthalmic solution 1 drop (not administered)  latanoprost (XALATAN) 0.005 % ophthalmic solution 1 drop (not administered)  losartan (COZAAR) tablet 100 mg (not administered)  magnesium oxide (MAG-OX) tablet 400 mg (not administered)  multivitamin with minerals tablet 1 tablet (not administered)  pantoprazole (PROTONIX) EC tablet 40 mg (not administered)  simvastatin (  ZOCOR) tablet 20 mg (not administered)  heparin injection 5,000 Units (5,000 Units Subcutaneous Given 03/14/17 2214)  sodium chloride flush (NS) 0.9 % injection 3 mL (not administered)  sodium chloride flush (NS) 0.9 % injection 3 mL (3 mLs Intravenous Given 03/14/17 2219)  sodium chloride flush (NS) 0.9 % injection 3 mL (not administered)  0.9 %  sodium chloride infusion (not administered)  acetaminophen (TYLENOL) tablet 650 mg (not administered)    Or  acetaminophen (TYLENOL) suppository 650 mg (not administered)  HYDROcodone-acetaminophen (NORCO/VICODIN) 5-325 MG per tablet 1-2 tablet (not administered)  ondansetron (ZOFRAN) tablet 4 mg (not administered)    Or  ondansetron (ZOFRAN) injection 4 mg (not administered)  insulin aspart (novoLOG) injection 0-9 Units (not administered)  insulin aspart (novoLOG) injection 0-5 Units (not administered)  methylPREDNISolone sodium succinate (SOLU-MEDROL) 40 mg/mL injection 40 mg (40 mg Intravenous Given 03/14/17 2213)  albuterol (PROVENTIL) (2.5 MG/3ML) 0.083% nebulizer solution 2.5 mg (2.5 mg Nebulization Given 03/14/17 2214)  iopamidol (ISOVUE-370) 76 % injection (100 mLs  Contrast Given 03/14/17 1818)     Initial Impression / Assessment and Plan / ED Course  I  have reviewed the triage vital signs and the nursing notes.  Pertinent labs & imaging results that were available during my care of the patient were reviewed by me and considered in my medical decision making (see chart for details).     CRITICAL CARE Performed by: Resa Miner Kareema Keitt Total critical care time: 35 minutes Critical care time was exclusive of separately billable procedures and treating other patients. Critical care was necessary to treat or prevent imminent or life-threatening deterioration. Critical care was time spent personally by me on the following activities: development of treatment plan with patient and/or surrogate as well as nursing, discussions with consultants, evaluation of patient's response to treatment, examination of patient, obtaining history from patient or surrogate, ordering and performing treatments and interventions, ordering and review of laboratory studies, ordering and review of radiographic studies, pulse oximetry and re-evaluation of patient's condition.   Patient will need admission to the hospital for respiratory distress and failure.  The patient was placed on BiPAP.  The patient when taken off BiPAP became more significantly distressed and had hypoxia.  This PE study was done due to the rapid onset of symptoms this is also looking for any other abnormalities that would cause his acute respiratory distress.  Patient will be admitted to the hospitalist and placed on stepdown.  Final Clinical Impressions(s) / ED Diagnoses   Final diagnoses:  Respiratory distress    ED Discharge Orders    None       Dalia Heading, PA-C 03/17/17 2248    Noemi Chapel, MD 03/18/17 [phone]

## 2017-03-15 NOTE — Progress Notes (Signed)
New 12 Lead EKG with heart rate 45-50's and paged day MD.  Will continue to monitor

## 2017-03-15 NOTE — Plan of Care (Signed)
Discussed with patient plan of care for the evening, pain management, condom cath and need to wear Bipap the rest of the night with some teach back displayed.

## 2017-03-15 NOTE — Progress Notes (Signed)
On-call MD made aware of previous 12 lead EKG on floor displaying heart block and new heart rate 45-50's.  Will continue to monitor

## 2017-03-15 NOTE — Progress Notes (Signed)
03/15/2017 Dr Carolin Sicks  Was made aware patient Heart rate in the 40's to the 50's, no orders given. Continue to monitor. [facility] RN.

## 2017-03-15 NOTE — Progress Notes (Signed)
03/15/2017 Lab called at [place] patient is RSV positive did notify first shift MD via [place] at [place]. RN did notify Bodenheimer (NP) at [place] to make him aware. [facility] RN.

## 2017-03-15 NOTE — Progress Notes (Signed)
PROGRESS NOTE    Lawrence Warner  GNF:[id number] DOB: 1919-01-12 DOA: 03/14/2017 PCP: Clovia Cuff, MD   Brief Narrative: 84 year old male with history of coronary artery disease status post CABG, chronic kidney disease stage III, hypertension presented to the ER with respiratory distress.  The symptoms started about a week prior to admission with shortness of breath, cough.  In the ER patient was in acute distress.  Chest x-ray and a CT scan of the chest with contrast negative for PE or any acute finding.  BNP was mildly elevated.  Patient was started on BiPAP and bronchodilators.  He was found to be 80s in room air in ER.  Admitted in stepdown unit.  Assessment & Plan:   #Possible acute bronchitis likely viral with bronchospasm/acute respiratory failure with hypoxia: -Chest x-ray with no acute finding.  CTA negative for PE or any acute cardiopulmonary abnormalities.  Troponin negative. -Patient was weaned to nasal cannula this morning when I examined him.  He reported that his breathing is better than when he came in.  Continue Solu-Medrol, bronchodilators.  I will check respiratory viral study. -Add Mucinex DM. -Echocardiogram and May 2018 showed EF of 60-65% with normal wall motion.  He does not have chest pain.  #Chronic kidney disease stage III: Serum creatinine level around baseline.  Monitor BMP.  Avoid for toxins.  #Hypertension, essential: Blood pressure was low this morning.  I am holding losartan further.  Monitor blood pressure.  #Sinus bradycardia: Asymptomatic and chronic as per patient.  He denies any headache or dizziness.  He is not on any beta-blocker.  Check TSH level.  #Type 2 diabetes: Continue sliding scale.  Monitor blood sugar level.  DVT prophylaxis: Lovenox subcutaneous Code Status: DNR Family Communication: No family at bedside Disposition Plan: Stepdown    Consultants:   None  Procedures: BiPAP Antimicrobials: None  Subjective: Seen and  examined at bedside.  Patient was just after BiPAP and nasal cannula.  Reported shortness of breath is somewhat better.  No cough.  Denied nausea vomiting.  Has some runny nose.  Objective: Vitals:   03/15/17 0716 03/15/17 0727 03/15/17 0740 03/15/17 1205  BP:  (!) 144/57 (!) 146/50 (!) 91/43  Pulse: (!) 107 (!) 105 (!) 105 73  Resp: 19 17 (!) 25 (!) 25  Temp:   98.1 F (36.7 C) 97.8 F (36.6 C)  TempSrc: Oral  Axillary Oral  SpO2: 97% 99% 99% 95%  Weight:      Height:        Intake/Output Summary (Last 24 hours) at 03/15/2017 1332 Last data filed at 03/15/2017 1210 Gross per 24 hour  Intake 10 ml  Output 550 ml  Net -540 ml   Filed Weights   03/15/17 0050 03/15/17 0500  Weight: 92 kg (202 lb 13.2 oz) 92 kg (202 lb 13.2 oz)    Examination:  General exam: Appears calm and comfortable  Respiratory system: Bilateral expiratory wheeze and rhonchi.  Respiratory effort normal. Cardiovascular system: S1 & S2 heard, regular bradycardic.  No pedal edema. Gastrointestinal system: Abdomen is nondistended, soft and nontender. Normal bowel sounds heard. Central nervous system: Alert and oriented. No focal neurological deficits. Extremities: Symmetric 5 x 5 power. Skin: No rashes, lesions or ulcers Psychiatry: Judgement and insight appear normal. Mood & affect appropriate.     Data Reviewed: I have personally reviewed following labs and imaging studies  CBC: Recent Labs  Lab 03/11/17 0918 03/14/17 1526  WBC 6.7 8.5  NEUTROABS 4.8  --  HGB  --  12.8*  HCT 39.5 39.7  MCV 94.0 94.1  PLT 195 536   Basic Metabolic Panel: Recent Labs  Lab 03/14/17 1526 03/15/17 0635  NA 137 136  K 4.3 4.1  CL 106 103  CO2 21* 21*  GLUCOSE 174* 203*  BUN 20 28*  CREATININE 1.47* 1.58*  CALCIUM 8.9 9.2   GFR: Estimated Creatinine Clearance: 29.6 mL/min (A) (by C-G formula based on SCr of 1.58 mg/dL (H)). Liver Function Tests: No results for input(s): AST, ALT, ALKPHOS, BILITOT, PROT,  ALBUMIN in the last 168 hours. No results for input(s): LIPASE, AMYLASE in the last 168 hours. No results for input(s): AMMONIA in the last 168 hours. Coagulation Profile: No results for input(s): INR, PROTIME in the last 168 hours. Cardiac Enzymes: No results for input(s): CKTOTAL, CKMB, CKMBINDEX, TROPONINI in the last 168 hours. BNP (last 3 results) No results for input(s): PROBNP in the last 8760 hours. HbA1C: No results for input(s): HGBA1C in the last 72 hours. CBG: Recent Labs  Lab 03/15/17 0144 03/15/17 0743 03/15/17 1155  GLUCAP 202* 174* 164*   Lipid Profile: No results for input(s): CHOL, HDL, LDLCALC, TRIG, CHOLHDL, LDLDIRECT in the last 72 hours. Thyroid Function Tests: No results for input(s): TSH, T4TOTAL, FREET4, T3FREE, THYROIDAB in the last 72 hours. Anemia Panel: No results for input(s): VITAMINB12, FOLATE, FERRITIN, TIBC, IRON, RETICCTPCT in the last 72 hours. Sepsis Labs: No results for input(s): PROCALCITON, LATICACIDVEN in the last 168 hours.  Recent Results (from the past 240 hour(s))  Culture, sputum-assessment     Status: None   Collection Time: 03/14/17 10:02 PM  Result Value Ref Range Status   Specimen Description EXPECTORATED SPUTUM  Final   Special Requests NONE  Final   Sputum evaluation   Final    Sputum specimen not acceptable for testing.  Please recollect.   Gram Stain Report Called to,Read Back By and Verified With: T NILSON,RN AT 0721 03/15/17 BY L BENFIELD    Report Status 03/15/2017 FINAL  Final  MRSA PCR Screening     Status: None   Collection Time: 03/15/17  2:30 AM  Result Value Ref Range Status   MRSA by PCR NEGATIVE NEGATIVE Final    Comment:        The GeneXpert MRSA Assay (FDA approved for NASAL specimens only), is one component of a comprehensive MRSA colonization surveillance program. It is not intended to diagnose MRSA infection nor to guide or monitor treatment for MRSA infections.          Radiology  Studies: Ct Angio Chest Pe W/cm &/or Wo Cm  Result Date: 03/14/2017 CLINICAL DATA:  Dyspnea short of breath. Suspect pulmonary embolism. Negative D-dimer EXAM: CT ANGIOGRAPHY CHEST WITH CONTRAST TECHNIQUE: Multidetector CT imaging of the chest was performed using the standard protocol during bolus administration of intravenous contrast. Multiplanar CT image reconstructions and MIPs were obtained to evaluate the vascular anatomy. CONTRAST:  142mL ISOVUE-370 IOPAMIDOL (ISOVUE-370) INJECTION 76% COMPARISON:  03/14/2017, CT 07/22/2016 FINDINGS: Cardiovascular: Negative for pulmonary embolism. Pulmonary arteries normal in caliber. Mild atherosclerotic disease aortic arch without aneurysm. Heart size within normal limits. Prior CABG. Mild left ventricular enlargement. Mediastinum/Nodes: Negative for mass or adenopathy. Metal wire in the subcarinal region presumably related to prior median sternotomy. This is unchanged from the prior study. Lungs/Pleura: Negative for infiltrate or effusion. 8 mm sub solid nodule right lower lobe is unchanged. 8 mm solid nodule right middle lobe unchanged. Negative for pneumonia. Upper Abdomen: No  acute abnormality Musculoskeletal: No acute skeletal abnormality. Ankylosing spondylitis. Review of the MIP images confirms the above findings. IMPRESSION: Negative for pulmonary embolism.  No acute abnormality in the chest. Electronically Signed   By: Franchot Gallo M.D.   On: 03/14/2017 18:56   Dg Chest Portable 1 View  Result Date: 03/14/2017 CLINICAL DATA:  Shortness of breath. EXAM: PORTABLE CHEST 1 VIEW COMPARISON:  PA and lateral chest 02/25/2016 and 01/01/2017. FINDINGS: The patient is status post CABG. Heart size is upper normal. Lungs are clear. No pneumothorax or pleural effusion. IMPRESSION: No acute disease. Electronically Signed   By: Inge Rise M.D.   On: 03/14/2017 15:34        Scheduled Meds: . heparin  5,000 Units Subcutaneous Q8H  . insulin aspart  0-5  Units Subcutaneous QHS  . insulin aspart  0-9 Units Subcutaneous TID WC  . latanoprost  1 drop Left Eye QHS  . magnesium oxide  400 mg Oral Daily  . methylPREDNISolone (SOLU-MEDROL) injection  40 mg Intravenous Q8H  . multivitamin with minerals  1 tablet Oral Daily  . pantoprazole  40 mg Oral Daily  . simvastatin  20 mg Oral q1800  . sodium chloride flush  3 mL Intravenous Q12H  . sodium chloride flush  3 mL Intravenous Q12H   Continuous Infusions: . sodium chloride       LOS: 1 day    Dron Tanna Furry, MD [facility] Pager [phone]  If 7PM-7AM, please contact night-coverage [email or web address] Password [email or web address] 03/15/2017, 1:32 PM

## 2017-03-16 DIAGNOSIS — J205 Acute bronchitis due to respiratory syncytial virus: Principal | ICD-10-CM

## 2017-03-16 LAB — GLUCOSE, CAPILLARY
GLUCOSE-CAPILLARY: 165 mg/dL — AB (ref 65–99)
GLUCOSE-CAPILLARY: 160 mg/dL — AB (ref 65–99)
GLUCOSE-CAPILLARY: 190 mg/dL — AB (ref 65–99)
GLUCOSE-CAPILLARY: 152 mg/dL — AB (ref 65–99)
Glucose-Capillary: 162 mg/dL — ABNORMAL HIGH (ref 65–99)

## 2017-03-16 LAB — T4, FREE: FREE T4: 0.83 ng/dL (ref 0.61–1.12)

## 2017-03-16 MED ORDER — ENOXAPARIN SODIUM 30 MG/0.3ML ~~LOC~~ SOLN
30.0000 mg | SUBCUTANEOUS | Status: DC
  Administered 2017-03-16 – 2017-03-18 (×3): 30 mg via SUBCUTANEOUS
  Filled 2017-03-16 (×3): qty 0.3

## 2017-03-16 MED ORDER — HYDRALAZINE HCL 20 MG/ML IJ SOLN
10.0000 mg | Freq: Four times a day (QID) | INTRAMUSCULAR | Status: DC | PRN
  Administered 2017-03-16 – 2017-03-18 (×3): 10 mg via INTRAVENOUS
  Filled 2017-03-16 (×3): qty 1

## 2017-03-16 MED ORDER — LOSARTAN POTASSIUM 50 MG PO TABS
50.0000 mg | ORAL_TABLET | Freq: Every day | ORAL | Status: DC
  Administered 2017-03-16 – 2017-03-17 (×2): 50 mg via ORAL
  Filled 2017-03-16 (×2): qty 1

## 2017-03-16 MED ORDER — POLYVINYL ALCOHOL 1.4 % OP SOLN
1.0000 [drp] | Freq: Four times a day (QID) | OPHTHALMIC | Status: DC
  Administered 2017-03-16 – 2017-03-19 (×13): 1 [drp] via OPHTHALMIC
  Filled 2017-03-16: qty 15

## 2017-03-16 NOTE — Evaluation (Signed)
Occupational Therapy Evaluation Patient Details Name: Lawrence Warner MRN: [id number] DOB: November 23, 1918 Today's Date: 03/16/2017    History of Present Illness Pt is a 84 y/o male admitted secondary to acute respiratory failure, likely acute bronchitis viral with bronchospasms. PMH including but not limited to DM, HTN, CKD, bradycardia, CHF, CAD s/p CABG in 2000 and R THA in 2014.   Clinical Impression   PTA, pt was living in an independent living facility on his own and was able to complete basic ADL and functional mobility at modified independent level with cane. He does have assistance with meal preparation and cleaning from the facility per chart. Pt currently presents with decreased awareness of safety and deficits and decreased activity tolerance for ADL impacting his ability to participate at [facility]. Pt would benefit from continued OT services while admitted to improve independence and safety with ADL and functional mobility prior to returning home with home health OT follow-up. Will need to continue education concerning safe LB dressing techniques as pt attempting to complete in standing and this is not a safe option at this time. Plan to educate on use of reacher next session.    Follow Up Recommendations  Home health OT;Supervision/Assistance - 24 hour    Equipment Recommendations  Other (comment)(long handled sponge)    Recommendations for Other Services       Precautions / Restrictions Precautions Precautions: Fall Precaution Comments: watch HR and SPO2 Restrictions Weight Bearing Restrictions: No      Mobility Bed Mobility Overal bed mobility: Needs Assistance Bed Mobility: Supine to Sit     Supine to sit: Min guard     General bed mobility comments: min guard for safety  Transfers Overall transfer level: Needs assistance Equipment used: Rolling walker (2 wheeled) Transfers: Sit to/from [facility] Sit to Stand: Min guard Stand pivot transfers:  Min guard       General transfer comment: Min guard assist with increased time and effort. Maximum cues for safe use of RW.     Balance Overall balance assessment: Needs assistance Sitting-balance support: Feet supported;No upper extremity supported Sitting balance-Leahy Scale: Good     Standing balance support: During functional activity;Bilateral upper extremity supported Standing balance-Leahy Scale: Poor Standing balance comment: Relies on UE support. Pt with decreased safety awareness; however.                            ADL either performed or assessed with clinical judgement   ADL Overall ADL's : Needs assistance/impaired Eating/Feeding: Set up;Sitting   Grooming: Set up;Sitting   Upper Body Bathing: Set up;Sitting   Lower Body Bathing: Moderate assistance;Sit to/from stand   Upper Body Dressing : Set up;Sitting   Lower Body Dressing: Moderate assistance;Sit to/from stand Lower Body Dressing Details (indicate cue type and reason): Pt attempting to don underwear by standing near doorknob, steadying himself on doorknob, and leaning over to don in standing. Despite maximum education that this was not a safe method, pt pushing therapist aside and insisting on performing this way. Despite mod assist for balance, he was unable to succuessfully complete task and required mod assist in seated position. Educated on use of reacher and plan to demonstrate next session.   Toilet Transfer: Min guard;Ambulation;RW Toilet Transfer Details (indicate cue type and reason): Pt reluctant to safely use RW and preferred using furniture and leaning unsafely.  Toileting- Water quality scientist and Hygiene: Min guard;Sit to/from stand       Functional  mobility during ADLs: Min guard;Rolling walker General ADL Comments: Initiated education concerning use of reacher to complete LB dressing tasks (donning pants and underwear).      Vision Baseline Vision/History: Wears glasses Wears  Glasses: Reading only Patient Visual Report: No change from baseline Vision Assessment?: No apparent visual deficits Additional Comments: Has limited neck AROM and as a result unable to turn head to scan environment. Tracks with eyes well and functional ocular motion.      Perception     Praxis      Pertinent Vitals/Pain Pain Assessment: No/denies pain     Hand Dominance     Extremity/Trunk Assessment Upper Extremity Assessment Upper Extremity Assessment: Overall WFL for tasks assessed   Lower Extremity Assessment Lower Extremity Assessment: Overall WFL for tasks assessed       Communication Communication Communication: No difficulties   Cognition Arousal/Alertness: Awake/alert Behavior During Therapy: WFL for tasks assessed/performed Overall Cognitive Status: Impaired/Different from baseline Area of Impairment: Safety/judgement                         Safety/Judgement: Decreased awareness of safety;Decreased awareness of deficits     General Comments: Pt attempting to unsafely balance himself with door handle to don underwear in standing position. Poor safety awareness with lines.    General Comments  Pt reporting "my oxygen levels are good" to therapist when instructed in pursed lip breathing techniques due to desaturation to 84% on RA. Educated concerning this and pt reports, "well, it is just because I have been dozing off." SpO2 rebounded to 97-100% with pursed lip breathing strategies. In moments of conversation, pt does additionally desaturate to 88%.    Exercises     Shoulder Instructions      Home Living Family/patient expects to be discharged to:: Private residence(independent living facility) Living Arrangements: Alone Available Help at Discharge: Available PRN/intermittently;Family Type of Home: Independent living facility Home Access: Level entry;Elevator     Home Layout: One level               Home Equipment: [place] - 4  wheels;Walker - 2 wheels;Cane - single point;Adaptive equipment Adaptive Equipment: Reacher;Sock aid;Long-handled shoe horn Additional Comments: Need to clarify bathroom set-up; states that he gets his meals provided and light housekeeping, but no other assistance      Prior Functioning/Environment Level of Independence: Independent with assistive device(s)        Comments: using SPC for ambulation        OT Problem List: Decreased activity tolerance;Impaired balance (sitting and/or standing);Decreased cognition;Decreased safety awareness;Decreased knowledge of use of DME or AE;Decreased knowledge of precautions;Impaired UE functional use;Cardiopulmonary status limiting activity      OT Treatment/Interventions: Self-care/ADL training;Therapeutic exercise;Energy conservation;DME and/or AE instruction;Therapeutic activities;Patient/family education;Balance training    OT Goals(Current goals can be found in the care plan section) Acute Rehab OT Goals Patient Stated Goal: return home OT Goal Formulation: With patient Time For Goal Achievement: 03/30/17 Potential to Achieve Goals: Good ADL Goals Pt Will Perform Grooming: with modified independence;standing Pt Will Perform Lower Body Dressing: with modified independence;with adaptive equipment;sit to/from stand Pt Will Transfer to Toilet: with modified independence;ambulating;regular height toilet Pt Will Perform Toileting - Clothing Manipulation and hygiene: with modified independence;sit to/from stand  OT Frequency: Min 2X/week   Barriers to D/C:            Co-evaluation              AM-PAC PT "6  Clicks" Daily Activity     Outcome Measure Help from another person eating meals?: A Little Help from another person taking care of personal grooming?: A Little Help from another person toileting, which includes using toliet, bedpan, or urinal?: A Little Help from another person bathing (including washing, rinsing, drying)?: A  Lot Help from another person to put on and taking off regular upper body clothing?: A Little Help from another person to put on and taking off regular lower body clothing?: A Lot 6 Click Score: 16   End of Session Equipment Utilized During Treatment: Rolling walker Nurse Communication: Mobility status(pt up in chair with alarm)  Activity Tolerance: Patient tolerated treatment well Patient left: in chair;with call bell/phone within reach;with chair alarm set  OT Visit Diagnosis: Other abnormalities of gait and mobility (R26.89)                Time: 1438-8875 OT Time Calculation (min): 22 min Charges:  OT General Charges $OT Visit: 1 Visit OT Evaluation $OT Eval Moderate Complexity: 1 Mod G-Codes:     Norman Herrlich, MS OTR/L  Pager: [place] [facility] Josetta Wigal 03/16/2017, 4:46 PM

## 2017-03-16 NOTE — Progress Notes (Signed)
PROGRESS NOTE    Lawrence Warner  VFI:[id number] DOB: 02-Dec-1918 DOA: 03/14/2017 PCP: Clovia Cuff, MD   Brief Narrative: 84 year old male with history of coronary artery disease status post CABG, chronic kidney disease stage III, hypertension presented to the ER with respiratory distress.  The symptoms started about a week prior to admission with shortness of breath, cough.  In the ER patient was in acute distress.  Chest x-ray and a CT scan of the chest with contrast negative for PE or any acute finding.  BNP was mildly elevated.  Patient was started on BiPAP and bronchodilators.  He was found to be 80s in room air in ER.  Admitted in stepdown unit.  Assessment & Plan:   # Acute viral bronchitis due to RSV virus with bronchospasm/acute respiratory failure with hypoxia: -Chest x-ray with no acute finding.  CTA negative for PE or any acute cardiopulmonary abnormalities.  Troponin negative. -Patient is currently on 3 L of oxygen, shortness of breath is gradually improving but not at baseline.  I am planning to continue Solu-Medrol, bronchodilators and try to wean oxygen to room air today.  Discussed with the patient's nurse. -Add Mucinex DM. -Echocardiogram and May 2018 showed EF of 60-65% with normal wall motion.  He does not have chest pain.  #Chronic kidney disease stage III: Serum creatinine level around baseline.  Monitor BMP.  Avoid for toxins.  #Hypertension, essential: Blood pressure mildly elevated today.  Losartan held yesterday because of hypotension.  Resuming lower dose of losartan today.  Continue to monitor.  #Sinus bradycardia: Asymptomatic and chronic as per patient.  He denies any headache or dizziness.  He is not on any beta-blocker.  TSH level lower, checking free T3 and free T4.  #Type 2 diabetes: Continue sliding scale.  Monitor blood sugar level.  Blood sugar level acceptable.  PT, OT evaluation.  DVT prophylaxis: Lovenox subcutaneous Code Status: DNR Family  Communication: No family at bedside Disposition Plan: Stepdown    Consultants:   None  Procedures: BiPAP Antimicrobials: None  Subjective: Seen and examined at bedside.  Off of BiPAP and currently on 3 L of oxygen.  Shortness of breath is better, having some cough.  No chest pain.  Denied headache, dizziness, nausea or vomiting. Objective: Vitals:   03/16/17 0335 03/16/17 0400 03/16/17 0500 03/16/17 0600  BP:  (!) 150/61 (!) 152/56   Pulse:  (!) 51 (!) 46 (!) 55  Resp:  19 20 20   Temp:      TempSrc:      SpO2:  99% 100% 100%  Weight: 92.7 kg (204 lb 5.9 oz)     Height:        Intake/Output Summary (Last 24 hours) at 03/16/2017 1145 Last data filed at 03/16/2017 4166 Gross per 24 hour  Intake 740 ml  Output 1275 ml  Net -535 ml   Filed Weights   03/15/17 0050 03/15/17 0500 03/16/17 0335  Weight: 92 kg (202 lb 13.2 oz) 92 kg (202 lb 13.2 oz) 92.7 kg (204 lb 5.9 oz)    Examination:  General exam: Elderly male sitting on chair Respiratory system: Bilateral expiratory wheeze and rhonchi, respiratory effort normal. Cardiovascular system: Regular rate rhythm S1-S2 normal.  No pedal edema. Gastrointestinal system: Abdomen is nondistended, soft and nontender. Normal bowel sounds heard. Central nervous system: Alert and oriented. No focal neurological deficits. Extremities: Symmetric 5 x 5 power. Skin: No rashes, lesions or ulcers Psychiatry: Judgement and insight appear normal. Mood & affect appropriate.  Data Reviewed: I have personally reviewed following labs and imaging studies  CBC: Recent Labs  Lab 03/11/17 0918 03/14/17 1526  WBC 6.7 8.5  NEUTROABS 4.8  --   HGB  --  12.8*  HCT 39.5 39.7  MCV 94.0 94.1  PLT 195 163   Basic Metabolic Panel: Recent Labs  Lab 03/14/17 1526 03/15/17 0635  NA 137 136  K 4.3 4.1  CL 106 103  CO2 21* 21*  GLUCOSE 174* 203*  BUN 20 28*  CREATININE 1.47* 1.58*  CALCIUM 8.9 9.2   GFR: Estimated Creatinine  Clearance: 29.7 mL/min (A) (by C-G formula based on SCr of 1.58 mg/dL (H)). Liver Function Tests: No results for input(s): AST, ALT, ALKPHOS, BILITOT, PROT, ALBUMIN in the last 168 hours. No results for input(s): LIPASE, AMYLASE in the last 168 hours. No results for input(s): AMMONIA in the last 168 hours. Coagulation Profile: No results for input(s): INR, PROTIME in the last 168 hours. Cardiac Enzymes: No results for input(s): CKTOTAL, CKMB, CKMBINDEX, TROPONINI in the last 168 hours. BNP (last 3 results) No results for input(s): PROBNP in the last 8760 hours. HbA1C: No results for input(s): HGBA1C in the last 72 hours. CBG: Recent Labs  Lab 03/15/17 1155 03/15/17 1743 03/15/17 2136 03/16/17 0757 03/16/17 1134  GLUCAP 164* 149* 163* 165* 160*   Lipid Profile: No results for input(s): CHOL, HDL, LDLCALC, TRIG, CHOLHDL, LDLDIRECT in the last 72 hours. Thyroid Function Tests: Recent Labs    03/15/17 1529 03/16/17 0933  TSH 0.324*  --   FREET4  --  0.83   Anemia Panel: No results for input(s): VITAMINB12, FOLATE, FERRITIN, TIBC, IRON, RETICCTPCT in the last 72 hours. Sepsis Labs: No results for input(s): PROCALCITON, LATICACIDVEN in the last 168 hours.  Recent Results (from the past 240 hour(s))  Culture, sputum-assessment     Status: None   Collection Time: 03/14/17 10:02 PM  Result Value Ref Range Status   Specimen Description EXPECTORATED SPUTUM  Final   Special Requests NONE  Final   Sputum evaluation   Final    Sputum specimen not acceptable for testing.  Please recollect.   Gram Stain Report Called to,Read Back By and Verified With: T NILSON,RN AT 0721 03/15/17 BY L BENFIELD    Report Status 03/15/2017 FINAL  Final  MRSA PCR Screening     Status: None   Collection Time: 03/15/17  2:30 AM  Result Value Ref Range Status   MRSA by PCR NEGATIVE NEGATIVE Final    Comment:        The GeneXpert MRSA Assay (FDA approved for NASAL specimens only), is one component of  a comprehensive MRSA colonization surveillance program. It is not intended to diagnose MRSA infection nor to guide or monitor treatment for MRSA infections.   Respiratory Panel by PCR     Status: Abnormal   Collection Time: 03/15/17  8:43 AM  Result Value Ref Range Status   Adenovirus NOT DETECTED NOT DETECTED Final   Coronavirus 229E NOT DETECTED NOT DETECTED Final   Coronavirus HKU1 NOT DETECTED NOT DETECTED Final   Coronavirus NL63 NOT DETECTED NOT DETECTED Final   Coronavirus OC43 NOT DETECTED NOT DETECTED Final   Metapneumovirus NOT DETECTED NOT DETECTED Final   Rhinovirus / Enterovirus NOT DETECTED NOT DETECTED Final   Influenza A NOT DETECTED NOT DETECTED Final   Influenza B NOT DETECTED NOT DETECTED Final   Parainfluenza Virus 1 NOT DETECTED NOT DETECTED Final   Parainfluenza Virus 2 NOT DETECTED  NOT DETECTED Final   Parainfluenza Virus 3 NOT DETECTED NOT DETECTED Final   Parainfluenza Virus 4 NOT DETECTED NOT DETECTED Final   Respiratory Syncytial Virus DETECTED (A) NOT DETECTED Final    Comment: CRITICAL RESULT CALLED TO, READ BACK BY AND VERIFIED WITH: N WELLINGTON,RN AT 1818 03/15/17 BY L BENFIELD    Bordetella pertussis NOT DETECTED NOT DETECTED Final   Chlamydophila pneumoniae NOT DETECTED NOT DETECTED Final   Mycoplasma pneumoniae NOT DETECTED NOT DETECTED Final         Radiology Studies: Ct Angio Chest Pe W/cm &/or Wo Cm  Result Date: 03/14/2017 CLINICAL DATA:  Dyspnea short of breath. Suspect pulmonary embolism. Negative D-dimer EXAM: CT ANGIOGRAPHY CHEST WITH CONTRAST TECHNIQUE: Multidetector CT imaging of the chest was performed using the standard protocol during bolus administration of intravenous contrast. Multiplanar CT image reconstructions and MIPs were obtained to evaluate the vascular anatomy. CONTRAST:  159mL ISOVUE-370 IOPAMIDOL (ISOVUE-370) INJECTION 76% COMPARISON:  03/14/2017, CT 07/22/2016 FINDINGS: Cardiovascular: Negative for pulmonary  embolism. Pulmonary arteries normal in caliber. Mild atherosclerotic disease aortic arch without aneurysm. Heart size within normal limits. Prior CABG. Mild left ventricular enlargement. Mediastinum/Nodes: Negative for mass or adenopathy. Metal wire in the subcarinal region presumably related to prior median sternotomy. This is unchanged from the prior study. Lungs/Pleura: Negative for infiltrate or effusion. 8 mm sub solid nodule right lower lobe is unchanged. 8 mm solid nodule right middle lobe unchanged. Negative for pneumonia. Upper Abdomen: No acute abnormality Musculoskeletal: No acute skeletal abnormality. Ankylosing spondylitis. Review of the MIP images confirms the above findings. IMPRESSION: Negative for pulmonary embolism.  No acute abnormality in the chest. Electronically Signed   By: Franchot Gallo M.D.   On: 03/14/2017 18:56   Dg Chest Portable 1 View  Result Date: 03/14/2017 CLINICAL DATA:  Shortness of breath. EXAM: PORTABLE CHEST 1 VIEW COMPARISON:  PA and lateral chest 02/25/2016 and 01/01/2017. FINDINGS: The patient is status post CABG. Heart size is upper normal. Lungs are clear. No pneumothorax or pleural effusion. IMPRESSION: No acute disease. Electronically Signed   By: Inge Rise M.D.   On: 03/14/2017 15:34        Scheduled Meds: . dextromethorphan-guaiFENesin  1 tablet Oral BID  . heparin  5,000 Units Subcutaneous Q8H  . insulin aspart  0-5 Units Subcutaneous QHS  . insulin aspart  0-9 Units Subcutaneous TID WC  . latanoprost  1 drop Left Eye QHS  . magnesium oxide  400 mg Oral Daily  . methylPREDNISolone (SOLU-MEDROL) injection  40 mg Intravenous Q8H  . multivitamin with minerals  1 tablet Oral Daily  . pantoprazole  40 mg Oral Daily  . polyvinyl alcohol  1 drop Both Eyes QID  . simvastatin  20 mg Oral q1800  . sodium chloride flush  3 mL Intravenous Q12H  . sodium chloride flush  3 mL Intravenous Q12H   Continuous Infusions: . sodium chloride       LOS:  2 days    Maelynn Moroney Tanna Furry, MD [facility] Pager [phone]  If 7PM-7AM, please contact night-coverage [email or web address] Password [email or web address] 03/16/2017, 11:45 AM

## 2017-03-16 NOTE — Plan of Care (Signed)
Discussed with the patient importance of not ambulating without assistance with some teach back displayed

## 2017-03-17 ENCOUNTER — Inpatient Hospital Stay (HOSPITAL_COMMUNITY): Payer: Medicare Other

## 2017-03-17 LAB — GLUCOSE, CAPILLARY
GLUCOSE-CAPILLARY: 148 mg/dL — AB (ref 65–99)
GLUCOSE-CAPILLARY: 209 mg/dL — AB (ref 65–99)
Glucose-Capillary: 162 mg/dL — ABNORMAL HIGH (ref 65–99)
Glucose-Capillary: 294 mg/dL — ABNORMAL HIGH (ref 65–99)
Glucose-Capillary: 133 mg/dL — ABNORMAL HIGH (ref 65–99)
Glucose-Capillary: 173 mg/dL — ABNORMAL HIGH (ref 65–99)

## 2017-03-17 LAB — T3, FREE: T3 FREE: 2.6 pg/mL (ref 2.0–4.4)

## 2017-03-17 MED ORDER — LOSARTAN POTASSIUM 50 MG PO TABS
100.0000 mg | ORAL_TABLET | Freq: Every day | ORAL | Status: DC
  Administered 2017-03-18 – 2017-03-19 (×2): 100 mg via ORAL
  Filled 2017-03-17 (×2): qty 2

## 2017-03-17 MED ORDER — LOSARTAN POTASSIUM 50 MG PO TABS
50.0000 mg | ORAL_TABLET | Freq: Once | ORAL | Status: AC
  Administered 2017-03-17: 50 mg via ORAL
  Filled 2017-03-17: qty 1

## 2017-03-17 MED ORDER — ORAL CARE MOUTH RINSE
15.0000 mL | Freq: Two times a day (BID) | OROMUCOSAL | Status: DC
  Administered 2017-03-17 – 2017-03-19 (×5): 15 mL via OROMUCOSAL

## 2017-03-17 MED ORDER — FUROSEMIDE 10 MG/ML IJ SOLN
40.0000 mg | Freq: Once | INTRAMUSCULAR | Status: AC
Start: 2017-03-17 — End: 2017-03-17
  Administered 2017-03-17: 40 mg via INTRAVENOUS
  Filled 2017-03-17: qty 4

## 2017-03-17 NOTE — Plan of Care (Signed)
Discussed with the patient plan of care for the evening, pain management, ordered fan for room temperature control and talked about ambulating in the morning and sitting up in chair for breakfast with some teach back displayed.

## 2017-03-17 NOTE — Progress Notes (Signed)
Patient had mild congestion last night but early this morning sounded wet and wheezing. MD paged for one-time dose Lasix.

## 2017-03-17 NOTE — Care Management Important Message (Signed)
Important Message  Patient Details  Name: Brantley Wiley MRN: [id number] Date of Birth: [birth year]   Medicare Important Message Given:  Yes    Zenon Mayo, RN 03/17/2017, 9:56 AM

## 2017-03-17 NOTE — Progress Notes (Signed)
RT went to assess patient.  Patient had increased work of breathing RR mid 20's with dyspnea.  Wet cough noted.  PT placed back on Bipap.

## 2017-03-17 NOTE — Care Management Note (Addendum)
Case Management Note  Patient Details  Name: Lawrence Warner MRN: [id number] Date of Birth: 1918-06-03  Subjective/Objective:   From [facility],  He has a walker and a cane at the facility, he has help with cleaning and cooking thru the facility.  The facility uses [facility] for therapy but patient states he would like to work with [facility].  NCM gave referral to [facility] with [facility], waiting to here back for [place], [place].  He is for possible dc tomorrow. Will need orders from MD with face to face.  NCM informed son, [name] of this information [phone].  He has history of coronary artery disease, CABG, CKD stage 3,HTN presents with respiratory distress.  In the ER patient was in acute distress.  Chest x-ray and a CT scan of the chest with contrast negative for PE or any acute finding.  BNP was mildly elevated.  Patient was started on BiPAP and bronchodilators.                 Action/Plan: DC back to [facility] facility with [place] with [facility].   Expected Discharge Date:  03/19/17               Expected Discharge Plan:  [place]  In-House Referral:     Discharge planning Services  CM Consult  Post Acute Care Choice:  Home Health Choice offered to:  Patient  DME Arranged:    DME Agency:     HH Arranged:  PT, OT, RN, aide [place] Agency:  [facility]  Status of Service:  Completed, signed off  If discussed at [place] of Stay Meetings, dates discussed:    Additional Comments:  Zenon Mayo, RN 03/17/2017, 10:00 AM

## 2017-03-17 NOTE — Progress Notes (Addendum)
Physical Therapy Treatment Patient Details Name: Lawrence Warner MRN: [id number] DOB: [birth year] Today's Date: 03/17/2017    History of Present Illness Pt is a 84 y/o male admitted secondary to acute respiratory failure, likely acute bronchitis viral with bronchospasms. PMH including but not limited to DM, HTN, CKD, bradycardia, CHF, CAD s/p CABG in 2000 and R THA in 2014.    PT Comments    Pt admitted with above diagnosis. Pt currently with functional limitations due to the deficits listed below (see PT Problem List). Pt was able to ambulate with RW with min guard assist.  Did desat on RA after 100 feet of ambulation to 85% and 80% by the time pt back to room.  Did better with O2 with ambulation keeping O2 higher than 88% with O2.  Encouraged incentive spirometer. Discussed benefits of short term SNF for endurance training but pt states he has done it before and wants to go home with HHPT. Will continue acute PT.  Pt will benefit from skilled PT to increase their independence and safety with mobility to allow discharge to the venue listed below.    SATURATION QUALIFICATIONS: (This note is used to comply with regulatory documentation for home oxygen)  Patient Saturations on Room Air at Rest = 90%  Patient Saturations on Room Air while Ambulating = 80%  Patient Saturations on 3 Liters of oxygen while Ambulating = 88%  Please briefly explain why patient needs home oxygen:Pt improves with O2 in place with activity.  Follow Up Recommendations  Home health PT     Equipment Recommendations  Other (comment)(?home O2)    Recommendations for Other Services       Precautions / Restrictions Precautions Precautions: Fall Precaution Comments: watch HR and SPO2 Restrictions Weight Bearing Restrictions: No    Mobility  Bed Mobility Overal bed mobility: Needs Assistance Bed Mobility: Supine to Sit     Supine to sit: Min guard     General bed mobility comments: min guard for  safety  Transfers Overall transfer level: Needs assistance Equipment used: Rolling walker (2 wheeled) Transfers: Sit to/from [facility] Sit to Stand: Min guard Stand pivot transfers: Min guard       General transfer comment: Min guard assist with increased time and effort. Maximum cues for safe use of RW.   Ambulation/Gait Ambulation/Gait assistance: Min guard Ambulation Distance (Feet): 150 Feet Assistive device: Rolling walker (2 wheeled) Gait Pattern/deviations: Step-through pattern;Decreased stride length;Trunk flexed   Gait velocity interpretation: Below normal speed for age/gender General Gait Details: Pt was able to use RW without assist and occasional cues to stay close to RW.  Pt uses rollator at home.  Overall, safe with RW but did desat toward end of walk needing O2 to keep sats up.     Stairs            Wheelchair Mobility    Modified Rankin (Stroke Patients Only)       Balance Overall balance assessment: Needs assistance Sitting-balance support: Feet supported;No upper extremity supported Sitting balance-Leahy Scale: Good     Standing balance support: During functional activity;Bilateral upper extremity supported Standing balance-Leahy Scale: Poor Standing balance comment: Relies on UE support. Pt with decreased safety awareness; however.                             Cognition Arousal/Alertness: Awake/alert Behavior During Therapy: WFL for tasks assessed/performed Overall Cognitive Status: Impaired/Different from baseline Area of Impairment: Safety/judgement  Safety/Judgement: Decreased awareness of safety;Decreased awareness of deficits            Exercises      General Comments General comments (skin integrity, edema, etc.): Desat to 80- 85% without O2 toward end of walk and took incr time to recover.  Did better when O2 stays in with ambulation.  O2 at 3L on departure due to  desat and O2 sats registering at 99%.  Pt encouraged to use incentive spirometer and reviewed with pt.       Pertinent Vitals/Pain Pain Assessment: No/denies pain    Home Living                      Prior Function            PT Goals (current goals can now be found in the care plan section) Acute Rehab PT Goals Patient Stated Goal: return home Progress towards PT goals: Progressing toward goals    Frequency    Min 3X/week      PT Plan Current plan remains appropriate    Co-evaluation              AM-PAC PT "6 Clicks" Daily Activity  Outcome Measure  Difficulty turning over in bed (including adjusting bedclothes, sheets and blankets)?: None Difficulty moving from lying on back to sitting on the side of the bed? : A Little Difficulty sitting down on and standing up from a chair with arms (e.g., wheelchair, bedside commode, etc,.)?: A Little Help needed moving to and from a bed to chair (including a wheelchair)?: A Little Help needed walking in hospital room?: A Little Help needed climbing 3-5 steps with a railing? : A Lot 6 Click Score: 18    End of Session Equipment Utilized During Treatment: Gait belt;Oxygen Activity Tolerance: Patient tolerated treatment well;Patient limited by fatigue Patient left: in chair;with call bell/phone within reach;with chair alarm set;with family/visitor present Nurse Communication: Mobility status PT Visit Diagnosis: Other abnormalities of gait and mobility (R26.89)     Time: 9735-3299 PT Time Calculation (min) (ACUTE ONLY): 34 min  Charges:  $Gait Training: 23-37 mins                    G Codes:       [place] [facility] [phone] (pager)    Denice Paradise 03/17/2017, 1:36 PM

## 2017-03-17 NOTE — Progress Notes (Signed)
SATURATION QUALIFICATIONS: (This note is used to comply with regulatory documentation for home oxygen)  Patient Saturations on Room Air at Rest = 90%  Patient Saturations on Room Air while Ambulating = 80%  Patient Saturations on 3 Liters of oxygen while Ambulating = 88%  Please briefly explain why patient needs home oxygen:Pt improves with O2 in place with activity. Thanks.  [place] [phone] (pager)

## 2017-03-17 NOTE — Progress Notes (Signed)
PROGRESS NOTE    Lawrence Warner  UGQ:[id number] DOB: 02/15/1919 DOA: 03/14/2017 PCP: Clovia Cuff, MD   Brief Narrative: 84 year old male with history of coronary artery disease status post CABG, chronic kidney disease stage III, hypertension presented to the ER with respiratory distress.  The symptoms started about a week prior to admission with shortness of breath, cough.  In the ER patient was in acute distress.  Chest x-ray and a CT scan of the chest with contrast negative for PE or any acute finding.  BNP was mildly elevated.  Patient was started on BiPAP and bronchodilators.  He was found to be 80s in room air in ER.  Admitted in stepdown unit.  Assessment & Plan:   # Acute viral bronchitis due to RSV virus with bronchospasm/acute respiratory failure with hypoxia: -Patient required BiPAP last night for worsening shortness of breath.  Repeat chest x-ray today with no acute finding.  Currently on 2-3 L of oxygen.  Patient is able to speak full sentence.  He is having cough.  He is not at baseline.  Planning to continue steroid and bronchodilators today.  PT OT evaluation. -CTA negative for PE or any acute cardiopulmonary abnormalities.  Troponin negative. -Add Mucinex DM. -Echocardiogram and May 2018 showed EF of 60-65% with normal wall motion.  He does not have chest pain.  #Chronic kidney disease stage III: Serum creatinine level around baseline.  Monitor BMP.  Avoid for toxins.  #Hypertension, essential: Blood pressure elevated.  Increase the dose of losartan 200 mg which is his home dose.  Continue to monitor.   #Sinus bradycardia: Asymptomatic and chronic as per patient.  He denies any headache or dizziness.  He is not on any beta-blocker.  TSH level lower, but normal free T3 and free T4.  Recommended to follow-up with PCP.  #Type 2 diabetes: Continue sliding scale.  Monitor blood sugar level.  Blood sugar level acceptable.  PT, OT evaluation.  Home care ordered.  DVT  prophylaxis: Lovenox subcutaneous Code Status: DNR Family Communication: No family at bedside Disposition Plan: Stepdown    Consultants:   None  Procedures: BiPAP Antimicrobials: None  Subjective: Seen and examined at bedside.  Overnight event noted.  Currently shortness of breath is better.  On nasal cannula.  Having cough.  Denies chest pain, nausea or vomiting. Objective: Vitals:   03/17/17 0727 03/17/17 0800 03/17/17 0900 03/17/17 1000  BP: (!) 184/86 (!) 151/62    Pulse: (!) 56 72 81 (!) 56  Resp: 16 (!) 26 (!) 22 20  Temp: (!) 96.7 F (35.9 C)     TempSrc: Axillary     SpO2: 100% 97% (!) 89% 94%  Weight:      Height:        Intake/Output Summary (Last 24 hours) at 03/17/2017 1147 Last data filed at 03/17/2017 1000 Gross per 24 hour  Intake 740 ml  Output 1500 ml  Net -760 ml   Filed Weights   03/15/17 0500 03/16/17 0335 03/17/17 0410  Weight: 92 kg (202 lb 13.2 oz) 92.7 kg (204 lb 5.9 oz) 86.3 kg (190 lb 4.1 oz)    Examination:  General exam: Pleasant elderly male sitting on bed, trying to have breakfast Respiratory system: Bilateral expiratory wheeze and rhonchi somewhat better than yesterday.  Respiratory effort normal, on nasal cannula. Cardiovascular system: Regular rate rhythm, S1-S2 normal.  No pedal edema. Gastrointestinal system: Abdomen is nondistended, soft and nontender. Normal bowel sounds heard. Central nervous system: Alert and oriented. No focal  neurological deficits. Skin: No rashes, lesions or ulcers Psychiatry: Judgement and insight appear normal. Mood & affect appropriate.     Data Reviewed: I have personally reviewed following labs and imaging studies  CBC: Recent Labs  Lab 03/11/17 0918 03/14/17 1526  WBC 6.7 8.5  NEUTROABS 4.8  --   HGB  --  12.8*  HCT 39.5 39.7  MCV 94.0 94.1  PLT 195 818   Basic Metabolic Panel: Recent Labs  Lab 03/14/17 1526 03/15/17 0635  NA 137 136  K 4.3 4.1  CL 106 103  CO2 21* 21*  GLUCOSE  174* 203*  BUN 20 28*  CREATININE 1.47* 1.58*  CALCIUM 8.9 9.2   GFR: Estimated Creatinine Clearance: 28.7 mL/min (A) (by C-G formula based on SCr of 1.58 mg/dL (H)). Liver Function Tests: No results for input(s): AST, ALT, ALKPHOS, BILITOT, PROT, ALBUMIN in the last 168 hours. No results for input(s): LIPASE, AMYLASE in the last 168 hours. No results for input(s): AMMONIA in the last 168 hours. Coagulation Profile: No results for input(s): INR, PROTIME in the last 168 hours. Cardiac Enzymes: No results for input(s): CKTOTAL, CKMB, CKMBINDEX, TROPONINI in the last 168 hours. BNP (last 3 results) No results for input(s): PROBNP in the last 8760 hours. HbA1C: No results for input(s): HGBA1C in the last 72 hours. CBG: Recent Labs  Lab 03/16/17 1134 03/16/17 1632 03/16/17 1821 03/16/17 1927 03/17/17 0757  GLUCAP 160* 190* 152* 162* 162*   Lipid Profile: No results for input(s): CHOL, HDL, LDLCALC, TRIG, CHOLHDL, LDLDIRECT in the last 72 hours. Thyroid Function Tests: Recent Labs    03/15/17 1529 03/16/17 0933  TSH 0.324*  --   FREET4  --  0.83  T3FREE  --  2.6   Anemia Panel: No results for input(s): VITAMINB12, FOLATE, FERRITIN, TIBC, IRON, RETICCTPCT in the last 72 hours. Sepsis Labs: No results for input(s): PROCALCITON, LATICACIDVEN in the last 168 hours.  Recent Results (from the past 240 hour(s))  Culture, sputum-assessment     Status: None   Collection Time: 03/14/17 10:02 PM  Result Value Ref Range Status   Specimen Description EXPECTORATED SPUTUM  Final   Special Requests NONE  Final   Sputum evaluation   Final    Sputum specimen not acceptable for testing.  Please recollect.   Gram Stain Report Called to,Read Back By and Verified With: T NILSON,RN AT 0721 03/15/17 BY L BENFIELD    Report Status 03/15/2017 FINAL  Final  MRSA PCR Screening     Status: None   Collection Time: 03/15/17  2:30 AM  Result Value Ref Range Status   MRSA by PCR NEGATIVE NEGATIVE  Final    Comment:        The GeneXpert MRSA Assay (FDA approved for NASAL specimens only), is one component of a comprehensive MRSA colonization surveillance program. It is not intended to diagnose MRSA infection nor to guide or monitor treatment for MRSA infections.   Respiratory Panel by PCR     Status: Abnormal   Collection Time: 03/15/17  8:43 AM  Result Value Ref Range Status   Adenovirus NOT DETECTED NOT DETECTED Final   Coronavirus 229E NOT DETECTED NOT DETECTED Final   Coronavirus HKU1 NOT DETECTED NOT DETECTED Final   Coronavirus NL63 NOT DETECTED NOT DETECTED Final   Coronavirus OC43 NOT DETECTED NOT DETECTED Final   Metapneumovirus NOT DETECTED NOT DETECTED Final   Rhinovirus / Enterovirus NOT DETECTED NOT DETECTED Final   Influenza A NOT DETECTED NOT  DETECTED Final   Influenza B NOT DETECTED NOT DETECTED Final   Parainfluenza Virus 1 NOT DETECTED NOT DETECTED Final   Parainfluenza Virus 2 NOT DETECTED NOT DETECTED Final   Parainfluenza Virus 3 NOT DETECTED NOT DETECTED Final   Parainfluenza Virus 4 NOT DETECTED NOT DETECTED Final   Respiratory Syncytial Virus DETECTED (A) NOT DETECTED Final    Comment: CRITICAL RESULT CALLED TO, READ BACK BY AND VERIFIED WITH: N WELLINGTON,RN AT 1818 03/15/17 BY L BENFIELD    Bordetella pertussis NOT DETECTED NOT DETECTED Final   Chlamydophila pneumoniae NOT DETECTED NOT DETECTED Final   Mycoplasma pneumoniae NOT DETECTED NOT DETECTED Final         Radiology Studies: Dg Chest Port 1 View  Result Date: 03/17/2017 CLINICAL DATA:  Shortness of Breath EXAM: PORTABLE CHEST 1 VIEW COMPARISON:  03/14/2017 FINDINGS: Cardiac shadow remains mildly enlarged. Postsurgical changes are again seen and stable. The lungs are well aerated bilaterally. No focal infiltrate or sizable effusion is seen. Old rib fractures on the left are noted. IMPRESSION: No acute abnormality noted. Electronically Signed   By: Inez Catalina M.D.   On: 03/17/2017 08:51         Scheduled Meds: . dextromethorphan-guaiFENesin  1 tablet Oral BID  . enoxaparin (LOVENOX) injection  30 mg Subcutaneous Q24H  . insulin aspart  0-5 Units Subcutaneous QHS  . insulin aspart  0-9 Units Subcutaneous TID WC  . latanoprost  1 drop Left Eye QHS  . losartan  100 mg Oral Daily  . magnesium oxide  400 mg Oral Daily  . methylPREDNISolone (SOLU-MEDROL) injection  40 mg Intravenous Q8H  . multivitamin with minerals  1 tablet Oral Daily  . pantoprazole  40 mg Oral Daily  . polyvinyl alcohol  1 drop Both Eyes QID  . simvastatin  20 mg Oral q1800  . sodium chloride flush  3 mL Intravenous Q12H  . sodium chloride flush  3 mL Intravenous Q12H   Continuous Infusions: . sodium chloride       LOS: 3 days    Ninfa Giannelli Tanna Furry, MD [facility] Pager [phone]  If 7PM-7AM, please contact night-coverage [email or web address] Password [email or web address] 03/17/2017, 11:47 AM

## 2017-03-18 DIAGNOSIS — E119 Type 2 diabetes mellitus without complications: Secondary | ICD-10-CM

## 2017-03-18 LAB — GLUCOSE, CAPILLARY
GLUCOSE-CAPILLARY: 269 mg/dL — AB (ref 65–99)
GLUCOSE-CAPILLARY: 213 mg/dL — AB (ref 65–99)
Glucose-Capillary: 180 mg/dL — ABNORMAL HIGH (ref 65–99)
Glucose-Capillary: 164 mg/dL — ABNORMAL HIGH (ref 65–99)

## 2017-03-18 NOTE — Progress Notes (Signed)
PROGRESS NOTE    Lawrence Warner  BWG:[id number] DOB: 06/13/1918 DOA: 03/14/2017 PCP: Clovia Cuff, MD   Brief Narrative: 84 year old male with history of coronary artery disease status post CABG, chronic kidney disease stage III, hypertension presented to the ER with respiratory distress.  The symptoms started about a week prior to admission with shortness of breath, cough.  In the ER patient was in acute distress.  Chest x-ray and a CT scan of the chest with contrast negative for PE or any acute finding.  BNP was mildly elevated.  Patient was started on BiPAP and bronchodilators.  He was found to be 80s in room air in ER.  Admitted in stepdown unit.  Assessment & Plan:   # Acute viral bronchitis due to RSV virus with bronchospasm/acute respiratory failure with hypoxia: -Patient is coughing and having severe wheezing today.  Shortness of breath is better.  Did not require BiPAP last night.  Oxygen desaturated yesterday with ambulation.  He still has audible wheezing, not at baseline.  I am planning to continue Solu-Medrol and work with PT.  Do ambulatory oxygen assessment test.  Continue current bronchodilators and IV steroid. -CTA negative for PE or any acute cardiopulmonary abnormalities.  Troponin negative. -ucinex DM. -Echocardiogram and May 2018 showed EF of 60-65% with normal wall motion.  He does not have chest pain.  #Chronic kidney disease stage III: Serum creatinine level around baseline.  Monitor BMP.  Avoid for toxins.  #Hypertension, essential: Continue losartan.  Monitor BP.  Avoid low blood pressure in this elderly male.  #Sinus bradycardia: Asymptomatic and chronic as per patient.  He denies any headache or dizziness.  He is not on any beta-blocker.  TSH level lower, but normal free T3 and free T4.  Recommended to follow-up with PCP.  #Type 2 diabetes: Continue sliding scale.  Monitor blood sugar level.  Blood sugar level acceptable.  PT, OT evaluation.  Home care  ordered.  DVT prophylaxis: Lovenox subcutaneous Code Status: DNR Family Communication: No family at bedside Disposition Plan: [facility], likely discharge home with home care services tomorrow.    Consultants:   None  Procedures: BiPAP Antimicrobials: None  Subjective: Seen and examined at bedside.  Having cough and audible wheezing.  Requiring 2-3 L of oxygen.  Still not feeling well and not at baseline.  No nausea vomiting or chest pain. Objective: Vitals:   03/18/17 0500 03/18/17 0600 03/18/17 0731 03/18/17 1137  BP: (!) 161/64 (!) 159/51 (!) 142/76 (!) 152/60  Pulse: (!) 56 (!) 59 (!) 54 (!) 58  Resp: 19 20 20 19   Temp:   97.7 F (36.5 C) 97.9 F (36.6 C)  TempSrc:   Oral Oral  SpO2: (!) 87% 92% 97% 92%  Weight:      Height:        Intake/Output Summary (Last 24 hours) at 03/18/2017 1336 Last data filed at 03/18/2017 0935 Gross per 24 hour  Intake 503 ml  Output 825 ml  Net -322 ml   Filed Weights   03/16/17 0335 03/17/17 0410 03/18/17 0254  Weight: 92.7 kg (204 lb 5.9 oz) 86.3 kg (190 lb 4.1 oz) 90.2 kg (198 lb 13.7 oz)    Examination:  General exam: Elderly male sitting on chair comfortable with audible wheezing Respiratory system: Bilateral expiratory wheeze and rhonchi, slow improvement.  Respiratory effort normal. Cardiovascular system: Regular rate rhythm S1-S2 normal.  No pedal edema. Gastrointestinal system: Abdomen is nondistended, soft and nontender. Normal bowel sounds heard. Central nervous system: Alert  and oriented. No focal neurological deficits. Skin: No rashes, lesions or ulcers Psychiatry: Judgement and insight appear normal. Mood & affect appropriate.     Data Reviewed: I have personally reviewed following labs and imaging studies  CBC: Recent Labs  Lab 03/14/17 1526  WBC 8.5  HGB 12.8*  HCT 39.7  MCV 94.1  PLT 628   Basic Metabolic Panel: Recent Labs  Lab 03/14/17 1526 03/15/17 0635  NA 137 136  K 4.3 4.1  CL 106 103    CO2 21* 21*  GLUCOSE 174* 203*  BUN 20 28*  CREATININE 1.47* 1.58*  CALCIUM 8.9 9.2   GFR: Estimated Creatinine Clearance: 29.3 mL/min (A) (by C-G formula based on SCr of 1.58 mg/dL (H)). Liver Function Tests: No results for input(s): AST, ALT, ALKPHOS, BILITOT, PROT, ALBUMIN in the last 168 hours. No results for input(s): LIPASE, AMYLASE in the last 168 hours. No results for input(s): AMMONIA in the last 168 hours. Coagulation Profile: No results for input(s): INR, PROTIME in the last 168 hours. Cardiac Enzymes: No results for input(s): CKTOTAL, CKMB, CKMBINDEX, TROPONINI in the last 168 hours. BNP (last 3 results) No results for input(s): PROBNP in the last 8760 hours. HbA1C: No results for input(s): HGBA1C in the last 72 hours. CBG: Recent Labs  Lab 03/17/17 1804 03/17/17 1922 03/17/17 2127 03/18/17 0730 03/18/17 1137  GLUCAP 133* 173* 209* 180* 269*   Lipid Profile: No results for input(s): CHOL, HDL, LDLCALC, TRIG, CHOLHDL, LDLDIRECT in the last 72 hours. Thyroid Function Tests: Recent Labs    03/15/17 1529 03/16/17 0933  TSH 0.324*  --   FREET4  --  0.83  T3FREE  --  2.6   Anemia Panel: No results for input(s): VITAMINB12, FOLATE, FERRITIN, TIBC, IRON, RETICCTPCT in the last 72 hours. Sepsis Labs: No results for input(s): PROCALCITON, LATICACIDVEN in the last 168 hours.  Recent Results (from the past 240 hour(s))  Culture, sputum-assessment     Status: None   Collection Time: 03/14/17 10:02 PM  Result Value Ref Range Status   Specimen Description EXPECTORATED SPUTUM  Final   Special Requests NONE  Final   Sputum evaluation   Final    Sputum specimen not acceptable for testing.  Please recollect.   Gram Stain Report Called to,Read Back By and Verified With: T NILSON,RN AT 0721 03/15/17 BY L BENFIELD    Report Status 03/15/2017 FINAL  Final  MRSA PCR Screening     Status: None   Collection Time: 03/15/17  2:30 AM  Result Value Ref Range Status   MRSA  by PCR NEGATIVE NEGATIVE Final    Comment:        The GeneXpert MRSA Assay (FDA approved for NASAL specimens only), is one component of a comprehensive MRSA colonization surveillance program. It is not intended to diagnose MRSA infection nor to guide or monitor treatment for MRSA infections.   Respiratory Panel by PCR     Status: Abnormal   Collection Time: 03/15/17  8:43 AM  Result Value Ref Range Status   Adenovirus NOT DETECTED NOT DETECTED Final   Coronavirus 229E NOT DETECTED NOT DETECTED Final   Coronavirus HKU1 NOT DETECTED NOT DETECTED Final   Coronavirus NL63 NOT DETECTED NOT DETECTED Final   Coronavirus OC43 NOT DETECTED NOT DETECTED Final   Metapneumovirus NOT DETECTED NOT DETECTED Final   Rhinovirus / Enterovirus NOT DETECTED NOT DETECTED Final   Influenza A NOT DETECTED NOT DETECTED Final   Influenza B NOT DETECTED NOT DETECTED  Final   Parainfluenza Virus 1 NOT DETECTED NOT DETECTED Final   Parainfluenza Virus 2 NOT DETECTED NOT DETECTED Final   Parainfluenza Virus 3 NOT DETECTED NOT DETECTED Final   Parainfluenza Virus 4 NOT DETECTED NOT DETECTED Final   Respiratory Syncytial Virus DETECTED (A) NOT DETECTED Final    Comment: CRITICAL RESULT CALLED TO, READ BACK BY AND VERIFIED WITH: N WELLINGTON,RN AT 1818 03/15/17 BY L BENFIELD    Bordetella pertussis NOT DETECTED NOT DETECTED Final   Chlamydophila pneumoniae NOT DETECTED NOT DETECTED Final   Mycoplasma pneumoniae NOT DETECTED NOT DETECTED Final         Radiology Studies: Dg Chest Port 1 View  Result Date: 03/17/2017 CLINICAL DATA:  Shortness of Breath EXAM: PORTABLE CHEST 1 VIEW COMPARISON:  03/14/2017 FINDINGS: Cardiac shadow remains mildly enlarged. Postsurgical changes are again seen and stable. The lungs are well aerated bilaterally. No focal infiltrate or sizable effusion is seen. Old rib fractures on the left are noted. IMPRESSION: No acute abnormality noted. Electronically Signed   By: Inez Catalina  M.D.   On: 03/17/2017 08:51        Scheduled Meds: . dextromethorphan-guaiFENesin  1 tablet Oral BID  . enoxaparin (LOVENOX) injection  30 mg Subcutaneous Q24H  . insulin aspart  0-5 Units Subcutaneous QHS  . insulin aspart  0-9 Units Subcutaneous TID WC  . latanoprost  1 drop Left Eye QHS  . losartan  100 mg Oral Daily  . magnesium oxide  400 mg Oral Daily  . mouth rinse  15 mL Mouth Rinse BID  . methylPREDNISolone (SOLU-MEDROL) injection  40 mg Intravenous Q8H  . multivitamin with minerals  1 tablet Oral Daily  . pantoprazole  40 mg Oral Daily  . polyvinyl alcohol  1 drop Both Eyes QID  . simvastatin  20 mg Oral q1800  . sodium chloride flush  3 mL Intravenous Q12H  . sodium chloride flush  3 mL Intravenous Q12H   Continuous Infusions: . sodium chloride       LOS: 4 days    Maris Bena Tanna Furry, MD [facility] Pager [phone]  If 7PM-7AM, please contact night-coverage [email or web address] Password [facility] 03/18/2017, 1:36 PM

## 2017-03-18 NOTE — Plan of Care (Signed)
Discussed with patient the importance of ambulation and making sure to watch his step and any objects in his way with some teach back displayed. Patient ambulated from chair to the bathroom, bathroom to sink and sink to chair

## 2017-03-18 NOTE — Progress Notes (Signed)
Physical Therapy Treatment Patient Details Name: Lawrence Warner MRN: [id number] DOB: 1918/07/30 Today's Date: 03/18/2017    History of Present Illness Pt is a 84 y/o male admitted secondary to acute respiratory failure, likely acute bronchitis viral with bronchospasms. PMH including but not limited to DM, HTN, CKD, bradycardia, CHF, CAD s/p CABG in 2000 and R THA in 2014.    PT Comments    Pt admitted with above diagnosis. Pt currently with functional limitations due to the deficits listed below (see PT Problem List). Pt was able to ambulate with incr distance.  Still desats on RA to 86% and needs 2LO2 to keep sats >90%.   Feel that pt is a great candidate for home PT and 24 hour care.  Will contact CM about [facility] First program. Pt will benefit from skilled PT to increase their independence and safety with mobility to allow discharge to the venue listed below.    SATURATION QUALIFICATIONS: (This note is used to comply with regulatory documentation for home oxygen)  Patient Saturations on Room Air at Rest = 91%  Patient Saturations on Room Air while Ambulating = 86%  Patient Saturations on 2 Liters of oxygen while Ambulating = 92%  Please briefly explain why patient needs home oxygen:Pt desats on RA with activity. Will need home O2 to keep sats >90%.    Follow Up Recommendations  Home health PT;Supervision/Assistance - 24 hour     Equipment Recommendations  Other (comment)(home O2)    Recommendations for Other Services       Precautions / Restrictions Precautions Precautions: Fall Precaution Comments: watch HR and SPO2 Restrictions Weight Bearing Restrictions: No    Mobility  Bed Mobility Overal bed mobility: Needs Assistance Bed Mobility: Supine to Sit     Supine to sit: Min guard     General bed mobility comments: min guard for safety  Transfers Overall transfer level: Needs assistance Equipment used: Rolling walker (2 wheeled) Transfers: Sit to/from [employer] Sit to Stand: Min guard Stand pivot transfers: Min guard       General transfer comment: Min guard assist with increased time and effort. Maximum cues for safe use of RW.   Ambulation/Gait Ambulation/Gait assistance: Min guard Ambulation Distance (Feet): 175 Feet Assistive device: Rolling walker (2 wheeled) Gait Pattern/deviations: Step-through pattern;Decreased stride length;Trunk flexed   Gait velocity interpretation: Below normal speed for age/gender General Gait Details: Pt was able to use RW without assist and occasional cues to stay close to RW.  Pt uses rollator at home.  Overall, safe with RW but did desat toward end of walk needing O2 to keep sats up.  Pt ambulated to toilet at end of sessin and had BM.     Stairs            Wheelchair Mobility    Modified Rankin (Stroke Patients Only)       Balance Overall balance assessment: Needs assistance Sitting-balance support: Feet supported;No upper extremity supported Sitting balance-Leahy Scale: Good     Standing balance support: During functional activity;Bilateral upper extremity supported Standing balance-Leahy Scale: Poor Standing balance comment: Relies on UE support. Pt with decreased safety awareness; however.                             Cognition Arousal/Alertness: Awake/alert Behavior During Therapy: WFL for tasks assessed/performed Overall Cognitive Status: Impaired/Different from baseline Area of Impairment: Safety/judgement  Safety/Judgement: Decreased awareness of safety;Decreased awareness of deficits            Exercises General Exercises - Lower Extremity Ankle Circles/Pumps: AROM;Both;10 reps;Supine Long Arc Quad: AROM;Both;10 reps;Seated Hip Flexion/Marching: AROM;Both;10 reps;Seated    General Comments General comments (skin integrity, edema, etc.): Desat to 86% on RA and needed 2LO2 to keep sats >90%.      Pertinent  Vitals/Pain Pain Assessment: No/denies pain    Home Living                      Prior Function            PT Goals (current goals can now be found in the care plan section) Acute Rehab PT Goals Patient Stated Goal: return home Progress towards PT goals: Progressing toward goals    Frequency    Min 3X/week      PT Plan Current plan remains appropriate    Co-evaluation              AM-PAC PT "6 Clicks" Daily Activity  Outcome Measure  Difficulty turning over in bed (including adjusting bedclothes, sheets and blankets)?: None Difficulty moving from lying on back to sitting on the side of the bed? : A Little Difficulty sitting down on and standing up from a chair with arms (e.g., wheelchair, bedside commode, etc,.)?: A Little Help needed moving to and from a bed to chair (including a wheelchair)?: A Little Help needed walking in hospital room?: A Little Help needed climbing 3-5 steps with a railing? : A Lot 6 Click Score: 18    End of Session Equipment Utilized During Treatment: Gait belt;Oxygen Activity Tolerance: Patient tolerated treatment well;Patient limited by fatigue Patient left: in chair;with call bell/phone within reach;with chair alarm set;with family/visitor present Nurse Communication: Mobility status PT Visit Diagnosis: Other abnormalities of gait and mobility (R26.89)     Time: 9794-8016 PT Time Calculation (min) (ACUTE ONLY): 29 min  Charges:  $Gait Training: 8-22 mins $Therapeutic Exercise: 8-22 mins                    G Codes:       [place] Chalice Philbert,PT Acute Rehabilitation [phone] (pager)    Denice Paradise 03/18/2017, 11:35 AM

## 2017-03-18 NOTE — Progress Notes (Signed)
Occupational Therapy Treatment Note  Pt making good progress toward goals. Feel pt is appropriate to DC home with 24/7 assistance and home health. Will continue to follow acutely.    03/18/17 1300  OT Visit Information  Last OT Received On 03/18/17  Assistance Needed +1  History of Present Illness Pt is a 84 y/o male admitted secondary to acute respiratory failure, likely acute bronchitis viral with bronchospasms. PMH including but not limited to DM, HTN, CKD, bradycardia, CHF, CAD s/p CABG in 2000 and R THA in 2014.  Precautions  Precautions Fall  Precaution Comments watch HR and SPO2  Pain Assessment  Pain Assessment No/denies pain  Cognition  Arousal/Alertness Awake/alert  Behavior During Therapy WFL for tasks assessed/performed  Overall Cognitive Status Within Functional Limits for tasks assessed  General Comments Pt most likley at [place]. Pt discussed "his way " of doing things  ADL  Overall ADL's  Needs assistance/impaired  Upper Body Dressing  Set up  Lower Body Dressing Moderate assistance;Sit to/from stand  Lower Body Dressing Details (indicate cue type and reason) Pt has reacher to use. discussed using reacher and pt stated that he thought that would be more difficult than "his way". discussed concerns over falls and pt verbalized understanding. Educated pt on need to have the [facility] address safety and his ADL and pt verbalized understanding.  Toilet Transfer Supervision/safety  Functional mobility during ADLs Supervision/safety  Bed Mobility  General bed mobility comments OOB in chair  Balance  Standing balance-Leahy Scale Fair  Standing balance comment No support given while pt moved from chair and took several steps to bed. Prior to moving, pt completed 20 squats on his own - states "I do 20 squats every morning"  Transfers  Overall transfer level Needs assistance  Transfers Sit to/from Stand  Sit to Stand Supervision  Stand pivot transfers Supervision  OT - End of  Session  Equipment Utilized During Treatment Oxygen  Activity Tolerance Patient tolerated treatment well  Patient left in chair;with call bell/phone within reach;with chair alarm set  Nurse Communication Mobility status  OT Assessment/Plan  OT Plan Discharge plan remains appropriate  OT Visit Diagnosis Other abnormalities of gait and mobility (R26.89)  OT Frequency (ACUTE ONLY) Min 2X/week  Follow Up Recommendations Home health OT;Supervision/Assistance - 24 hour  OT Equipment None recommended by OT  AM-PAC OT "6 Clicks" Daily Activity Outcome Measure  Help from another person eating meals? 4  Help from another person taking care of personal grooming? 4  Help from another person toileting, which includes using toliet, bedpan, or urinal? 3  Help from another person bathing (including washing, rinsing, drying)? 3  Help from another person to put on and taking off regular upper body clothing? 4  Help from another person to put on and taking off regular lower body clothing? 3  6 Click Score 21  ADL G Code Conversion CJ  OT Goal Progression  Progress towards OT goals Progressing toward goals  Acute Rehab OT Goals  Patient Stated Goal return home  OT Goal Formulation With patient  Time For Goal Achievement 03/30/17  Potential to Achieve Goals Good  ADL Goals  Pt Will Perform Toileting - Clothing Manipulation and hygiene with modified independence;sit to/from stand  Pt Will Perform Grooming with modified independence;standing  Pt Will Perform Lower Body Dressing with modified independence;with adaptive equipment;sit to/from stand  Pt Will Transfer to Toilet with modified independence;ambulating;regular height toilet  OT Time Calculation  OT Start Time (ACUTE ONLY) 1134  OT Stop Time (ACUTE ONLY) 1152  OT Time Calculation (min) 18 min  OT General Charges  $OT Visit 1 Visit  OT Treatments  $Self Care/Home Management  8-22 mins  [facility], OT/L  [phone] 03/18/2017

## 2017-03-18 NOTE — Progress Notes (Signed)
SATURATION QUALIFICATIONS: (This note is used to comply with regulatory documentation for home oxygen)  Patient Saturations on Room Air at Rest = 91%  Patient Saturations on Room Air while Ambulating = 86%  Patient Saturations on 2 Liters of oxygen while Ambulating = 92%  Please briefly explain why patient needs home oxygen:Pt desats on RA with activity. Will need home O2 to keep sats >90%.  Thanks.  [employer] [phone] (pager)

## 2017-03-19 LAB — GLUCOSE, CAPILLARY
Glucose-Capillary: 151 mg/dL — ABNORMAL HIGH (ref 65–99)
Glucose-Capillary: 229 mg/dL — ABNORMAL HIGH (ref 65–99)

## 2017-03-19 MED ORDER — DM-GUAIFENESIN ER 30-600 MG PO TB12: 1.0000 | ORAL_TABLET | Freq: Two times a day (BID) | ORAL | 0 refills | Status: AC

## 2017-03-19 MED ORDER — DOCUSATE SODIUM 100 MG PO CAPS: 100.0000 mg | ORAL_CAPSULE | Freq: Two times a day (BID) | ORAL | 0 refills | Status: DC | PRN

## 2017-03-19 MED ORDER — OMEPRAZOLE 20 MG PO CPDR: 20.0000 mg | DELAYED_RELEASE_CAPSULE | Freq: Every day | ORAL | Status: DC

## 2017-03-19 MED ORDER — PREDNISONE 10 MG PO TABS: 10.0000 mg | ORAL_TABLET | Freq: Every day | ORAL | 0 refills | Status: DC

## 2017-03-19 NOTE — Discharge Summary (Signed)
Physician Discharge Summary  Lawrence Warner LOV:[id number] DOB: [birth year] DOA: 03/14/2017  PCP: Clovia Cuff, MD  Admit date: 03/14/2017 Discharge date: 03/19/2017  Admitted From:ALF Disposition:ALF  Recommendations for Outpatient Follow-up:  1. Follow up with PCP in 1-2 weeks 2. Please obtain BMP/CBC in one week   Home Health:yes Equipment/Devices:oxygen 2 L Discharge Condition:stable CODE STATUS:DNR Diet recommendation:heart healthy  Brief/Interim Summary: 84 year old male with history of coronary artery disease status post CABG, chronic kidney disease stage III, hypertension presented to the ER with respiratory distress.  The symptoms started about a week prior to admission with shortness of breath, cough.  In the ER patient was in acute distress.  Chest x-ray and a CT scan of the chest with contrast negative for PE or any acute finding.  BNP was mildly elevated.  Patient was started on BiPAP and bronchodilators.  He was found to be 80s in room air in ER.  Admitted in stepdown unit.  # Acute viral bronchitis due to RSV virus with bronchospasm/acute respiratory failure with hypoxia: -Treated with BiPAP, IV Solu-Medrol with clinical improvement.  Patient still has some mild wheeze but otherwise is doing well.  Requiring about 2 L of oxygen on discharge.  Cough medication.  Recommend follow with PCP.  Denies shortness of breath or chest pain today. -CTA negative for PE or any acute cardiopulmonary abnormalities.  Troponin negative. -Echocardiogram and May 2018 showed EF of 60-65% with normal wall motion.  He does not have chest pain.  #Chronic kidney disease stage III: Serum creatinine level around baseline.  Monitor BMP.  Avoid for toxins.  #Hypertension, essential: Continue losartan.  Monitor BP.  Avoid low blood pressure in this elderly male.  #Sinus bradycardia: Asymptomatic and chronic as per patient.  He denies any headache or dizziness.  He is not on any beta-blocker.   TSH level lower, but normal free T3 and free T4.  Recommended to follow-up with PCP.  #Type 2 diabetes:   Monitor blood sugar level.    Follow-up with PCP.  Discharge Diagnoses:  Principal Problem:   Bronchospasm with bronchitis, acute Active Problems:   Diet-controlled diabetes mellitus ([facility])   Essential hypertension   Acute respiratory failure with hypoxia (H[facility])   CRI (chronic renal insufficiency), stage 3 (moderate) (HCC)   RSV bronchitis    Discharge Instructions  Discharge Instructions    Call MD for:  difficulty breathing, headache or visual disturbances   Complete by:  As directed    Call MD for:  extreme fatigue   Complete by:  As directed    Call MD for:  hives   Complete by:  As directed    Call MD for:  persistant dizziness or light-headedness   Complete by:  As directed    Call MD for:  persistant nausea and vomiting   Complete by:  As directed    Call MD for:  severe uncontrolled pain   Complete by:  As directed    Call MD for:  temperature >100.4   Complete by:  As directed    Diet - low sodium heart healthy   Complete by:  As directed    Face-to-face encounter (required for Medicare/Medicaid patients)   Complete by:  As directed    I Chelby Salata Tanna Furry certify that this patient is under my care and that I, or a nurse practitioner or physician's assistant working with me, had a face-to-face encounter that meets the physician face-to-face encounter requirements with this patient on 03/17/2017. The encounter with the  patient was in whole, or in part for the following medical condition(s) which is the primary reason for home health care (List medical condition):SOB, weakness,   The encounter with the patient was in whole, or in part, for the following medical condition, which is the primary reason for home health care:  SOB, weakness,   I certify that, based on my findings, the following services are medically necessary home health services:   Nursing Physical  therapy     Reason for Medically Necessary Home Health Services:  Skilled Nursing- Skilled Assessment/Observation   My clinical findings support the need for the above services:  Shortness of breath with activity   Further, I certify that my clinical findings support that this patient is homebound due to:  Unable to leave home safely without assistance   Home Health   Complete by:  As directed    To provide the following care/treatments:   PT OT RN Home Health Aide     Increase activity slowly   Complete by:  As directed      Allergies as of 03/19/2017      Reactions   Alfuzosin Hcl Er Other (See Comments)   Confusion/Uroxatral   Imdur [isosorbide Dinitrate]    REACTION NOT RECALLED   Penicillins Hives, Other (See Comments)   Has patient had a PCN reaction causing immediate rash, facial/tongue/throat swelling, SOB or lightheadedness with hypotension: Yes Has patient had a PCN reaction causing severe rash involving mucus membranes or skin necrosis: Unknown Has patient had a PCN reaction that required hospitalization: Unknown Has patient had a PCN reaction occurring within the last 10 years: Unknown If all of the above answers are "NO", then may proceed with Cephalosporin use.   Tape    SKIN IS VERY THIN AND WILL TEAR EASILY      Medication List    STOP taking these medications   methylPREDNISolone 4 MG Tbpk tablet Commonly known as:  MEDROL DOSEPAK   nitroGLYCERIN 0.4 MG SL tablet Commonly known as:  NITROSTAT   ZITHROMAX Z-PAK PO     TAKE these medications   acetaminophen 500 MG tablet Commonly known as:  TYLENOL Take 500-1,000 mg by mouth daily as needed for mild pain or headache.   ARTIFICIAL TEARS PF OP Apply to eye.   dextromethorphan-guaiFENesin 30-600 MG 12hr tablet Commonly known as:  MUCINEX DM Take 1 tablet by mouth 2 (two) times daily for 5 days.   docusate sodium 100 MG capsule Commonly known as:  COLACE Take 1 capsule (100 mg total) by mouth 2  (two) times daily as needed for mild constipation.   fluticasone 50 MCG/ACT nasal spray Commonly known as:  FLONASE Place 2 sprays into both nostrils 2 (two) times daily. For 7days   hydroxypropyl methylcellulose / hypromellose 2.5 % ophthalmic solution Commonly known as:  ISOPTO TEARS / GONIOVISC Place 1 drop into both eyes 3 (three) times daily as needed for dry eyes.   latanoprost 0.005 % ophthalmic solution Commonly known as:  XALATAN Place 1 drop into the left eye at bedtime.   losartan 100 MG tablet Commonly known as:  COZAAR Take 100 mg by mouth daily.   magnesium oxide 400 MG tablet Commonly known as:  MAG-OX Take 400 mg daily by mouth.   multivitamin with minerals Tabs tablet Take 1 tablet by mouth daily.   omeprazole 20 MG capsule Commonly known as:  PRILOSEC Take 1 capsule (20 mg total) by mouth daily before breakfast.   predniSONE 10  MG tablet Commonly known as:  DELTASONE Take 1 tablet (10 mg total) by mouth daily. 3 tabs for 2 days, 2 tabs for 2 days and 1 tab for 2 days and then stop.   PROAIR HFA 108 (90 Base) MCG/ACT inhaler Generic drug:  albuterol every 6 (six) hours as needed.   simvastatin 20 MG tablet Commonly known as:  ZOCOR Take 20 mg by mouth at bedtime.            Durable Medical Equipment  (From admission, onward)        Start     Ordered   03/19/17 1100  DME Oxygen  Once    Question Answer Comment  Mode or (Route) Nasal cannula   Liters per Minute 2   Frequency Continuous (stationary and portable oxygen unit needed)   Oxygen conserving device Yes   Oxygen delivery system Gas      03/19/17 1100     Follow-up Information    Clovia Cuff, MD. Schedule an appointment as soon as possible for a visit in 1 week(s).   Specialty:  Internal Medicine Contact information: [place] [address] [phone]        Minus Breeding, MD .   Specialty:  Cardiology Contact information: [address] [place] [address] [phone]          Allergies  Allergen Reactions  . Alfuzosin Hcl Er Other (See Comments)    Confusion/Uroxatral   . Imdur [Isosorbide Dinitrate]     REACTION NOT RECALLED  . Penicillins Hives and Other (See Comments)    Has patient had a PCN reaction causing immediate rash, facial/tongue/throat swelling, SOB or lightheadedness with hypotension: Yes Has patient had a PCN reaction causing severe rash involving mucus membranes or skin necrosis: Unknown Has patient had a PCN reaction that required hospitalization: Unknown Has patient had a PCN reaction occurring within the last 10 years: Unknown If all of the above answers are "NO", then may proceed with Cephalosporin use.   . Tape     SKIN IS VERY THIN AND WILL TEAR EASILY    Consultations: None  Procedures/Studies: BiPAP Subjective: Seen and examined at bedside.  Patient was sitting on chair comfortable.  Denies shortness of breath, nausea vomiting headache.  Reported feeling better.  He was able to walk but requiring 2 L of oxygen.  Discharge Exam: Vitals:   03/19/17 0500 03/19/17 0728  BP: (!) 147/49 (!) 173/59  Pulse: (!) 49 (!) 55  Resp: (!) 21 19  Temp:  97.8 F (36.6 C)  SpO2: 90% 95%   Vitals:   03/19/17 0334 03/19/17 0400 03/19/17 0500 03/19/17 0728  BP:  104/89 (!) 147/49 (!) 173/59  Pulse:  (!) 44 (!) 49 (!) 55  Resp:  19 (!) 21 19  Temp:    97.8 F (36.6 C)  TempSrc:    Oral  SpO2:  93% 90% 95%  Weight: 88.5 kg (195 lb 1.7 oz)     Height:        General: Pt is alert, awake, not in acute distress Cardiovascular: RRR, S1/S2 +, no rubs, no gallops Respiratory: Mild bilateral diffuse wheeze significantly improved, no crackle. Abdominal: Soft, NT, ND, bowel sounds + Extremities: no edema, no cyanosis    The results of significant diagnostics from this hospitalization (including imaging, microbiology, ancillary and laboratory) are listed below for  reference.     Microbiology: Recent Results (from the past 240 hour(s))  Culture, sputum-assessment     Status: None   Collection Time: 03/14/17 10:02 PM  Result Value Ref Range Status   Specimen Description EXPECTORATED SPUTUM  Final   Special Requests NONE  Final   Sputum evaluation   Final    Sputum specimen not acceptable for testing.  Please recollect.   Gram Stain Report Called to,Read Back By and Verified With: T NILSON,RN AT 0721 03/15/17 BY L BENFIELD    Report Status 03/15/2017 FINAL  Final  MRSA PCR Screening     Status: None   Collection Time: 03/15/17  2:30 AM  Result Value Ref Range Status   MRSA by PCR NEGATIVE NEGATIVE Final    Comment:        The GeneXpert MRSA Assay (FDA approved for NASAL specimens only), is one component of a comprehensive MRSA colonization surveillance program. It is not intended to diagnose MRSA infection nor to guide or monitor treatment for MRSA infections.   Respiratory Panel by PCR     Status: Abnormal   Collection Time: 03/15/17  8:43 AM  Result Value Ref Range Status   Adenovirus NOT DETECTED NOT DETECTED Final   Coronavirus 229E NOT DETECTED NOT DETECTED Final   Coronavirus HKU1 NOT DETECTED NOT DETECTED Final   Coronavirus NL63 NOT DETECTED NOT DETECTED Final   Coronavirus OC43 NOT DETECTED NOT DETECTED Final   Metapneumovirus NOT DETECTED NOT DETECTED Final   Rhinovirus / Enterovirus NOT DETECTED NOT DETECTED Final   Influenza A NOT DETECTED NOT DETECTED Final   Influenza B NOT DETECTED NOT DETECTED Final   Parainfluenza Virus 1 NOT DETECTED NOT DETECTED Final   Parainfluenza Virus 2 NOT DETECTED NOT DETECTED Final   Parainfluenza Virus 3 NOT DETECTED NOT DETECTED Final   Parainfluenza Virus 4 NOT DETECTED NOT DETECTED Final   Respiratory Syncytial Virus DETECTED (A) NOT DETECTED Final    Comment: CRITICAL RESULT CALLED TO, READ BACK BY AND VERIFIED WITH: N WELLINGTON,RN AT 1818 03/15/17 BY L BENFIELD    Bordetella  pertussis NOT DETECTED NOT DETECTED Final   Chlamydophila pneumoniae NOT DETECTED NOT DETECTED Final   Mycoplasma pneumoniae NOT DETECTED NOT DETECTED Final     Labs: BNP (last 3 results) Recent Labs    07/20/16 1540 07/27/16 1350 03/14/17 1528  BNP 256.0* 173.6* 384.6*   Basic Metabolic Panel: Recent Labs  Lab 03/14/17 1526 03/15/17 0635  NA 137 136  K 4.3 4.1  CL 106 103  CO2 21* 21*  GLUCOSE 174* 203*  BUN 20 28*  CREATININE 1.47* 1.58*  CALCIUM 8.9 9.2   Liver Function Tests: No results for input(s): AST, ALT, ALKPHOS, BILITOT, PROT, ALBUMIN in the last 168 hours. No results for input(s): LIPASE, AMYLASE in the last 168 hours. No results for input(s): AMMONIA in the last 168 hours. CBC: Recent Labs  Lab 03/14/17 1526  WBC 8.5  HGB 12.8*  HCT 39.7  MCV 94.1  PLT 168   Cardiac Enzymes: No results for input(s): CKTOTAL, CKMB, CKMBINDEX, TROPONINI in the last 168 hours. BNP: Invalid input(s): POCBNP CBG: Recent Labs  Lab 03/18/17 0730 03/18/17 1137 03/18/17 1634 03/18/17 2136 03/19/17 0727  GLUCAP 180* 269* 213* 164* 151*   D-Dimer No results for input(s): DDIMER in the last 72 hours. Hgb A1c No results for input(s): HGBA1C in the last 72 hours. Lipid Profile No results for input(s): CHOL, HDL, LDLCALC, TRIG, CHOLHDL, LDLDIRECT in the last 72 hours. Thyroid function studies No results for input(s): TSH, T4TOTAL, T3FREE, THYROIDAB  in the last 72 hours.  Invalid input(s): FREET3 Anemia work up No results for input(s): VITAMINB12, FOLATE, FERRITIN, TIBC, IRON, RETICCTPCT in the last 72 hours. Urinalysis    Component Value Date/Time   COLORURINE YELLOW 07/27/2016 1702   APPEARANCEUR CLEAR 07/27/2016 1702   LABSPEC 1.019 07/27/2016 1702   PHURINE 5.0 07/27/2016 1702   GLUCOSEU NEGATIVE 07/27/2016 1702   HGBUR NEGATIVE 07/27/2016 1702   BILIRUBINUR NEGATIVE 07/27/2016 1702   KETONESUR NEGATIVE 07/27/2016 1702   PROTEINUR NEGATIVE 07/27/2016  1702   NITRITE NEGATIVE 07/27/2016 1702   LEUKOCYTESUR NEGATIVE 07/27/2016 1702   Sepsis Labs Invalid input(s): PROCALCITONIN,  WBC,  LACTICIDVEN Microbiology Recent Results (from the past 240 hour(s))  Culture, sputum-assessment     Status: None   Collection Time: 03/14/17 10:02 PM  Result Value Ref Range Status   Specimen Description EXPECTORATED SPUTUM  Final   Special Requests NONE  Final   Sputum evaluation   Final    Sputum specimen not acceptable for testing.  Please recollect.   Gram Stain Report Called to,Read Back By and Verified With: T NILSON,RN AT 0721 03/15/17 BY L BENFIELD    Report Status 03/15/2017 FINAL  Final  MRSA PCR Screening     Status: None   Collection Time: 03/15/17  2:30 AM  Result Value Ref Range Status   MRSA by PCR NEGATIVE NEGATIVE Final    Comment:        The GeneXpert MRSA Assay (FDA approved for NASAL specimens only), is one component of a comprehensive MRSA colonization surveillance program. It is not intended to diagnose MRSA infection nor to guide or monitor treatment for MRSA infections.   Respiratory Panel by PCR     Status: Abnormal   Collection Time: 03/15/17  8:43 AM  Result Value Ref Range Status   Adenovirus NOT DETECTED NOT DETECTED Final   Coronavirus 229E NOT DETECTED NOT DETECTED Final   Coronavirus HKU1 NOT DETECTED NOT DETECTED Final   Coronavirus NL63 NOT DETECTED NOT DETECTED Final   Coronavirus OC43 NOT DETECTED NOT DETECTED Final   Metapneumovirus NOT DETECTED NOT DETECTED Final   Rhinovirus / Enterovirus NOT DETECTED NOT DETECTED Final   Influenza A NOT DETECTED NOT DETECTED Final   Influenza B NOT DETECTED NOT DETECTED Final   Parainfluenza Virus 1 NOT DETECTED NOT DETECTED Final   Parainfluenza Virus 2 NOT DETECTED NOT DETECTED Final   Parainfluenza Virus 3 NOT DETECTED NOT DETECTED Final   Parainfluenza Virus 4 NOT DETECTED NOT DETECTED Final   Respiratory Syncytial Virus DETECTED (A) NOT DETECTED Final     Comment: CRITICAL RESULT CALLED TO, READ BACK BY AND VERIFIED WITH: N WELLINGTON,RN AT 1818 03/15/17 BY L BENFIELD    Bordetella pertussis NOT DETECTED NOT DETECTED Final   Chlamydophila pneumoniae NOT DETECTED NOT DETECTED Final   Mycoplasma pneumoniae NOT DETECTED NOT DETECTED Final     Time coordinating discharge: 31 minutes  SIGNED:   Rosita Fire, MD  [facility] 03/19/2017, 11:00 AM  If 7PM-7AM, please contact night-coverage [email or web address] Password [email or web address]

## 2017-03-19 NOTE — Progress Notes (Signed)
Patient being discharged home.Patient given discharge instructions. Patient educated on medications and making follow up appts. Patient discharged with home O2. Patient verbalized understanding of discharged instructions. Patient discharged home with son.

## 2017-03-20 ENCOUNTER — Emergency Department (HOSPITAL_COMMUNITY): Payer: Medicare Other

## 2017-03-20 ENCOUNTER — Observation Stay (HOSPITAL_COMMUNITY)
Admission: EM | Admit: 2017-03-20 | Discharge: 2017-03-21 | Disposition: A | Discharge status: Home / Self Care | Payer: Medicare Other | Attending: Family Medicine | Admitting: Family Medicine

## 2017-03-20 ENCOUNTER — Other Ambulatory Visit: Payer: Self-pay

## 2017-03-20 ENCOUNTER — Encounter (HOSPITAL_COMMUNITY): Payer: Self-pay | Admitting: *Deleted

## 2017-03-20 DIAGNOSIS — R740 Nonspecific elevation of levels of transaminase and lactic acid dehydrogenase [LDH]: Secondary | ICD-10-CM | POA: Diagnosis not present

## 2017-03-20 DIAGNOSIS — Z951 Presence of aortocoronary bypass graft: Secondary | ICD-10-CM | POA: Insufficient documentation

## 2017-03-20 DIAGNOSIS — R140 Abdominal distension (gaseous): Secondary | ICD-10-CM | POA: Diagnosis not present

## 2017-03-20 DIAGNOSIS — E119 Type 2 diabetes mellitus without complications: Secondary | ICD-10-CM | POA: Diagnosis not present

## 2017-03-20 DIAGNOSIS — R0602 Shortness of breath: Secondary | ICD-10-CM

## 2017-03-20 DIAGNOSIS — R05 Cough: Secondary | ICD-10-CM | POA: Insufficient documentation

## 2017-03-20 DIAGNOSIS — R069 Unspecified abnormalities of breathing: Secondary | ICD-10-CM | POA: Diagnosis not present

## 2017-03-20 DIAGNOSIS — I5030 Unspecified diastolic (congestive) heart failure: Secondary | ICD-10-CM | POA: Insufficient documentation

## 2017-03-20 DIAGNOSIS — J40 Bronchitis, not specified as acute or chronic: Secondary | ICD-10-CM | POA: Insufficient documentation

## 2017-03-20 DIAGNOSIS — N179 Acute kidney failure, unspecified: Secondary | ICD-10-CM | POA: Diagnosis not present

## 2017-03-20 DIAGNOSIS — Z87891 Personal history of nicotine dependence: Secondary | ICD-10-CM | POA: Diagnosis not present

## 2017-03-20 DIAGNOSIS — I110 Hypertensive heart disease with heart failure: Secondary | ICD-10-CM | POA: Diagnosis not present

## 2017-03-20 DIAGNOSIS — Z79899 Other long term (current) drug therapy: Secondary | ICD-10-CM | POA: Insufficient documentation

## 2017-03-20 DIAGNOSIS — R058 Other specified cough: Secondary | ICD-10-CM | POA: Diagnosis present

## 2017-03-20 DIAGNOSIS — R7989 Other specified abnormal findings of blood chemistry: Secondary | ICD-10-CM

## 2017-03-20 DIAGNOSIS — K5730 Diverticulosis of large intestine without perforation or abscess without bleeding: Secondary | ICD-10-CM | POA: Diagnosis not present

## 2017-03-20 DIAGNOSIS — R059 Cough, unspecified: Secondary | ICD-10-CM

## 2017-03-20 LAB — RESPIRATORY PANEL BY PCR
ADENOVIRUS-RVPPCR: NOT DETECTED
Bordetella pertussis: NOT DETECTED
CORONAVIRUS NL63-RVPPCR: NOT DETECTED
CORONAVIRUS OC43-RVPPCR: NOT DETECTED
Chlamydophila pneumoniae: NOT DETECTED
Coronavirus 229E: NOT DETECTED
Coronavirus HKU1: NOT DETECTED
INFLUENZA A-RVPPCR: NOT DETECTED
INFLUENZA B-RVPPCR: NOT DETECTED
METAPNEUMOVIRUS-RVPPCR: NOT DETECTED
MYCOPLASMA PNEUMONIAE-RVPPCR: NOT DETECTED
PARAINFLUENZA VIRUS 1-RVPPCR: NOT DETECTED
PARAINFLUENZA VIRUS 2-RVPPCR: NOT DETECTED
PARAINFLUENZA VIRUS 4-RVPPCR: NOT DETECTED
Parainfluenza Virus 3: NOT DETECTED
RESPIRATORY SYNCYTIAL VIRUS-RVPPCR: DETECTED — AB
Rhinovirus / Enterovirus: NOT DETECTED

## 2017-03-20 LAB — CBC
HEMATOCRIT: 44.5 % (ref 39.0–52.0)
Hemoglobin: 13.9 g/dL (ref 13.0–17.0)
MCH: 30 pg (ref 26.0–34.0)
MCHC: 31.2 g/dL (ref 30.0–36.0)
MCV: 95.9 fL (ref 78.0–100.0)
PLATELETS: 199 10*3/uL (ref 150–400)
RBC: 4.64 MIL/uL (ref 4.22–5.81)
RDW: 14 % (ref 11.5–15.5)
WBC: 13 10*3/uL — ABNORMAL HIGH (ref 4.0–10.5)

## 2017-03-20 LAB — CBC WITH DIFFERENTIAL/PLATELET
Basophils Absolute: 0.1 10*3/uL (ref 0.0–0.1)
Basophils Relative: 0 %
EOS ABS: 0 10*3/uL (ref 0.0–0.7)
EOS PCT: 0 %
HCT: 48.4 % (ref 39.0–52.0)
Hemoglobin: 15.7 g/dL (ref 13.0–17.0)
LYMPHS PCT: 21 %
Lymphs Abs: 2.9 10*3/uL (ref 0.7–4.0)
MCH: 30.8 pg (ref 26.0–34.0)
MCHC: 32.4 g/dL (ref 30.0–36.0)
MCV: 94.9 fL (ref 78.0–100.0)
MONO ABS: 1.3 10*3/uL — AB (ref 0.1–1.0)
Monocytes Relative: 10 %
Neutro Abs: 9.2 10*3/uL — ABNORMAL HIGH (ref 1.7–7.7)
Neutrophils Relative %: 69 %
PLATELETS: 223 10*3/uL (ref 150–400)
RBC: 5.1 MIL/uL (ref 4.22–5.81)
RDW: 13.4 % (ref 11.5–15.5)
WBC: 13.5 10*3/uL — ABNORMAL HIGH (ref 4.0–10.5)

## 2017-03-20 LAB — URINALYSIS, ROUTINE W REFLEX MICROSCOPIC
Bilirubin Urine: NEGATIVE
GLUCOSE, UA: NEGATIVE mg/dL
HGB URINE DIPSTICK: NEGATIVE
KETONES UR: NEGATIVE mg/dL
LEUKOCYTES UA: NEGATIVE
Nitrite: NEGATIVE
PROTEIN: NEGATIVE mg/dL
Specific Gravity, Urine: 1.018 (ref 1.005–1.030)
pH: 5 (ref 5.0–8.0)

## 2017-03-20 LAB — COMPREHENSIVE METABOLIC PANEL
ALK PHOS: 59 U/L (ref 38–126)
ALT: 63 U/L (ref 17–63)
AST: 52 U/L — AB (ref 15–41)
Albumin: 3.4 g/dL — ABNORMAL LOW (ref 3.5–5.0)
Anion gap: 18 — ABNORMAL HIGH (ref 5–15)
BILIRUBIN TOTAL: 1.2 mg/dL (ref 0.3–1.2)
BUN: 68 mg/dL — AB (ref 6–20)
CALCIUM: 9.3 mg/dL (ref 8.9–10.3)
CO2: 18 mmol/L — ABNORMAL LOW (ref 22–32)
CREATININE: 2.02 mg/dL — AB (ref 0.61–1.24)
Chloride: 102 mmol/L (ref 101–111)
GFR calc Af Amer: 30 mL/min — ABNORMAL LOW (ref >60)
GFR calc non Af Amer: 26 mL/min — ABNORMAL LOW (ref >60)
GLUCOSE: 129 mg/dL — AB (ref 65–99)
Potassium: 4.4 mmol/L (ref 3.5–5.1)
Sodium: 138 mmol/L (ref 135–145)
TOTAL PROTEIN: 6.3 g/dL — AB (ref 6.5–8.1)

## 2017-03-20 LAB — CREATININE, SERUM
Creatinine, Ser: 1.86 mg/dL — ABNORMAL HIGH (ref 0.61–1.24)
GFR, EST AFRICAN AMERICAN: 34 mL/min — AB (ref >60)
GFR, EST NON AFRICAN AMERICAN: 29 mL/min — AB (ref >60)

## 2017-03-20 LAB — BRAIN NATRIURETIC PEPTIDE: B Natriuretic Peptide: 353.5 pg/mL — ABNORMAL HIGH (ref 0.0–100.0)

## 2017-03-20 LAB — TROPONIN I: TROPONIN I: 0.21 ng/mL — AB (ref <0.03)

## 2017-03-20 LAB — I-STAT CG4 LACTIC ACID, ED
LACTIC ACID, VENOUS: 4.14 mmol/L — AB (ref 0.5–1.9)
Lactic Acid, Venous: 4 mmol/L (ref 0.5–1.9)

## 2017-03-20 LAB — INFLUENZA PANEL BY PCR (TYPE A & B)
INFLAPCR: NEGATIVE
INFLBPCR: NEGATIVE

## 2017-03-20 LAB — PROCALCITONIN: Procalcitonin: <0.1 ng/mL

## 2017-03-20 MED ORDER — IOPAMIDOL (ISOVUE-300) INJECTION 61%
INTRAVENOUS | Status: AC
  Filled 2017-03-20: qty 100

## 2017-03-20 MED ORDER — KETOROLAC TROMETHAMINE 15 MG/ML IJ SOLN: 15.0000 mg | Freq: Four times a day (QID) | INTRAMUSCULAR | Status: DC | PRN

## 2017-03-20 MED ORDER — MAGNESIUM OXIDE 400 (241.3 MG) MG PO TABS
400.0000 mg | ORAL_TABLET | Freq: Every day | ORAL | Status: DC
  Administered 2017-03-20 – 2017-03-21 (×2): 400 mg via ORAL
  Filled 2017-03-20 (×3): qty 1

## 2017-03-20 MED ORDER — ENOXAPARIN SODIUM 40 MG/0.4ML ~~LOC~~ SOLN
40.0000 mg | SUBCUTANEOUS | Status: DC
  Filled 2017-03-20: qty 0.4

## 2017-03-20 MED ORDER — ONDANSETRON HCL 4 MG/2ML IJ SOLN: 4.0000 mg | Freq: Four times a day (QID) | INTRAMUSCULAR | Status: DC | PRN

## 2017-03-20 MED ORDER — LATANOPROST 0.005 % OP SOLN
1.0000 [drp] | Freq: Every day | OPHTHALMIC | Status: DC
  Administered 2017-03-20: 1 [drp] via OPHTHALMIC
  Filled 2017-03-20: qty 2.5

## 2017-03-20 MED ORDER — PREDNISONE 20 MG PO TABS
60.0000 mg | ORAL_TABLET | Freq: Once | ORAL | Status: AC
  Administered 2017-03-20: 60 mg via ORAL
  Filled 2017-03-20: qty 3

## 2017-03-20 MED ORDER — ADULT MULTIVITAMIN W/MINERALS CH
1.0000 | ORAL_TABLET | Freq: Every day | ORAL | Status: DC
  Administered 2017-03-20 – 2017-03-21 (×2): 1 via ORAL
  Filled 2017-03-20 (×2): qty 1

## 2017-03-20 MED ORDER — PREDNISONE 5 MG PO TABS
10.0000 mg | ORAL_TABLET | Freq: Every day | ORAL | Status: DC
  Administered 2017-03-20 – 2017-03-21 (×2): 10 mg via ORAL
  Filled 2017-03-20 (×2): qty 2

## 2017-03-20 MED ORDER — IBUPROFEN 200 MG PO TABS: 600.0000 mg | ORAL_TABLET | Freq: Four times a day (QID) | ORAL | Status: DC | PRN

## 2017-03-20 MED ORDER — ACETAMINOPHEN 500 MG PO TABS
500.0000 mg | ORAL_TABLET | Freq: Every day | ORAL | Status: DC | PRN
  Administered 2017-03-20: 500 mg via ORAL
  Filled 2017-03-20: qty 1

## 2017-03-20 MED ORDER — ASPIRIN 81 MG PO CHEW
324.0000 mg | CHEWABLE_TABLET | Freq: Once | ORAL | Status: AC
  Administered 2017-03-20: 324 mg via ORAL
  Filled 2017-03-20: qty 4

## 2017-03-20 MED ORDER — BISACODYL 5 MG PO TBEC: 5.0000 mg | DELAYED_RELEASE_TABLET | Freq: Every day | ORAL | Status: DC | PRN

## 2017-03-20 MED ORDER — HYPROMELLOSE (GONIOSCOPIC) 2.5 % OP SOLN: 1.0000 [drp] | Freq: Three times a day (TID) | OPHTHALMIC | Status: DC | PRN

## 2017-03-20 MED ORDER — ONDANSETRON HCL 4 MG PO TABS: 4.0000 mg | ORAL_TABLET | Freq: Four times a day (QID) | ORAL | Status: DC | PRN

## 2017-03-20 MED ORDER — FLUTICASONE PROPIONATE 50 MCG/ACT NA SUSP
2.0000 | Freq: Two times a day (BID) | NASAL | Status: DC
  Administered 2017-03-20 – 2017-03-21 (×2): 2 via NASAL
  Filled 2017-03-20: qty 16

## 2017-03-20 MED ORDER — LEVOFLOXACIN IN D5W 750 MG/150ML IV SOLN
750.0000 mg | Freq: Once | INTRAVENOUS | Status: AC
  Administered 2017-03-20: 750 mg via INTRAVENOUS
  Filled 2017-03-20: qty 150

## 2017-03-20 MED ORDER — DM-GUAIFENESIN ER 30-600 MG PO TB12
1.0000 | ORAL_TABLET | Freq: Two times a day (BID) | ORAL | Status: DC
  Administered 2017-03-20 – 2017-03-21 (×2): 1 via ORAL
  Filled 2017-03-20 (×3): qty 1

## 2017-03-20 MED ORDER — POTASSIUM CHLORIDE IN NACL 20-0.9 MEQ/L-% IV SOLN
INTRAVENOUS | Status: DC
  Administered 2017-03-20 – 2017-03-21 (×2): via INTRAVENOUS
  Filled 2017-03-20 (×2): qty 1000

## 2017-03-20 MED ORDER — SODIUM CHLORIDE 0.9 % IV BOLUS (SEPSIS)
1000.0000 mL | Freq: Once | INTRAVENOUS | Status: AC
  Administered 2017-03-20: 1000 mL via INTRAVENOUS

## 2017-03-20 MED ORDER — ENOXAPARIN SODIUM 30 MG/0.3ML ~~LOC~~ SOLN: 30.0000 mg | SUBCUTANEOUS | Status: DC

## 2017-03-20 MED ORDER — SIMVASTATIN 20 MG PO TABS: 20.0000 mg | ORAL_TABLET | Freq: Every day | ORAL | Status: DC

## 2017-03-20 MED ORDER — DEXTRAN 70-HYPROMELLOSE (PF) 0.1-0.3 % OP SOLN: 2.0000 [drp] | Freq: Three times a day (TID) | OPHTHALMIC | Status: DC

## 2017-03-20 MED ORDER — PANTOPRAZOLE SODIUM 40 MG PO TBEC
40.0000 mg | DELAYED_RELEASE_TABLET | Freq: Every day | ORAL | Status: DC
  Administered 2017-03-20 – 2017-03-21 (×2): 40 mg via ORAL
  Filled 2017-03-20 (×2): qty 1

## 2017-03-20 MED ORDER — DOCUSATE SODIUM 100 MG PO CAPS: 100.0000 mg | ORAL_CAPSULE | Freq: Two times a day (BID) | ORAL | Status: DC | PRN

## 2017-03-20 MED ORDER — POLYVINYL ALCOHOL 1.4 % OP SOLN
1.0000 [drp] | Freq: Three times a day (TID) | OPHTHALMIC | Status: DC | PRN
  Administered 2017-03-20 – 2017-03-21 (×2): 1 [drp] via OPHTHALMIC
  Filled 2017-03-20: qty 15

## 2017-03-20 MED ORDER — SENNA 8.6 MG PO TABS
1.0000 | ORAL_TABLET | Freq: Two times a day (BID) | ORAL | Status: DC
  Administered 2017-03-20 – 2017-03-21 (×2): 8.6 mg via ORAL
  Filled 2017-03-20 (×3): qty 1

## 2017-03-20 MED ORDER — DEXTRAN 70-HYPROMELLOSE (PF) 0.1-0.3 % OP SOLN: Freq: Every day | OPHTHALMIC | Status: DC

## 2017-03-20 MED ORDER — ALBUTEROL (5 MG/ML) CONTINUOUS INHALATION SOLN
10.0000 mg/h | INHALATION_SOLUTION | RESPIRATORY_TRACT | Status: DC
  Administered 2017-03-20: 10 mg/h via RESPIRATORY_TRACT
  Filled 2017-03-20: qty 20

## 2017-03-20 MED ORDER — LOSARTAN POTASSIUM 50 MG PO TABS
100.0000 mg | ORAL_TABLET | Freq: Every day | ORAL | Status: DC
  Administered 2017-03-20 – 2017-03-21 (×2): 100 mg via ORAL
  Filled 2017-03-20 (×3): qty 2

## 2017-03-20 MED ORDER — IPRATROPIUM BROMIDE 0.02 % IN SOLN
1.0000 mg | Freq: Once | RESPIRATORY_TRACT | Status: AC
  Administered 2017-03-20: 1 mg via RESPIRATORY_TRACT
  Filled 2017-03-20: qty 5

## 2017-03-20 NOTE — ED Triage Notes (Signed)
Per EMS- pt reported SOB and generalized weakness. Pt was recently discharge from here with URI. Pt noted to have wheezing and productive cough. Pt received 10mg  of albuterol and 0.5 of atrovent.

## 2017-03-20 NOTE — ED Provider Notes (Signed)
Emergency Department Provider Note   I have reviewed the triage vital signs and the nursing notes.   HISTORY  Chief Complaint Shortness of Breath   HPI Lawrence Warner is a 84 y.o. male multiple medical problems as documented below the presents to the emergency department today for shortness of breath.  Patient had a near syncopal episode earlier today is associate with coughing and shortness of breath.  He was just discharged from the hospital yesterday after being admitted for approximately 5 days secondary to what sounds like orchitis that caused respiratory distress thought to be related to a viral syndrome.  Is not on any antibiotics.  He denies chest pain.  He does endorse worsening cough that is productive of a yellowish thick sputum.  Afebrile. No other associated or modifying symptoms.    Past Medical History:  Diagnosis Date  . BPH (benign prostatic hypertrophy)   . Chronic heart failure ([place])   . Chronic renal insufficiency   . Coronary artery disease    CABG in 2000  . Diabetes type 2, controlled ([place])    Diet-controlled  . Diabetic neuropathy ([place])   . Dyslipidemia   . Hypertension   . ITP (idiopathic thrombocytopenic purpura)   . Osteoarthritis   . Pneumonia     Patient Active Problem List   Diagnosis Date Noted  . Productive cough 03/20/2017  . RSV bronchitis   . Bronchospasm with bronchitis, acute 03/14/2017  . Diastolic congestive heart failure ([place]) 11/26/2016  . CRI (chronic renal insufficiency), stage 3 (moderate) ([place]) 11/26/2016  . LBBB (left bundle branch block) 11/26/2016  . Hypoxia   . Acute respiratory failure with hypoxia ([place]) 07/20/2016  . Respiratory failure ([place]) 02/28/2016  . Acute respiratory failure with hypoxemia ([place]) 02/28/2016  . Dyslipidemia 02/28/2016  . AKI (acute kidney injury) ([place])   . Influenza   . Sepsis ([place])   . Constipation 12/07/2015  . Bradycardia 11/28/2015  . Port catheter in place 10/24/2015  . GERD  (gastroesophageal reflux disease) 10/09/2015  . Gait instability 10/09/2015  . Chest pain 10/05/2015  . Diet-controlled diabetes mellitus ([place]) 05/07/2015  . Hx of CABG 05/07/2015  . Essential hypertension 05/07/2015  . Muscle cramps 04/25/2015  . Shortness of breath 04/11/2015  . Bronchitis 03/14/2015  . Idiopathic thrombocytopenic purpura ([place]) 09/27/2014    Past Surgical History:  Procedure Laterality Date  . CHOLECYSTECTOMY    . CORONARY ARTERY BYPASS GRAFT  2000  . Right hip replacement  2014  . TONSILLECTOMY      Current Outpatient Rx  . Order #: [id number] Class: Historical Med  . Order #: [id number] Class: Historical Med  . Order #: [id number] Class: Historical Med  . Order #: [id number] Class: Print  . Order #: [id number] Class: Print  . Order #: [id number] Class: Historical Med  . Order #: [id number] Class: Historical Med  . Order #: [id number] Class: Historical Med  . Order #: [id number] Class: No Print  . Order #: [id number] Class: Print  . Order #: [id number] Class: Print  . Order #: [id number] Class: Historical Med  . Order #: [id number] Class: Historical Med  . Order #: [id number] Class: Historical Med    Allergies Alfuzosin hcl er; Imdur [isosorbide dinitrate]; Penicillins; and Tape  Family History  Problem Relation Age of Onset  . Lung disease Father   . Cancer Brother     Social History Social History   Tobacco Use  . Smoking status: Former Smoker    Packs/day: 2.00    Years: 25.00    Pack years: 50.00  . Smokeless  tobacco: Never Used  Substance Use Topics  . Alcohol use: No  . Drug use: No    Review of Systems  All other systems negative except as documented in the HPI. All pertinent positives and negatives as reviewed in the HPI. ____________________________________________   PHYSICAL EXAM:  VITAL SIGNS: ED Triage Vitals [03/20/17 0848]  Enc Vitals Group     BP (!) 127/51     Pulse Rate 70     Resp (!) 26     Temp 98.6 F (37 C)     Temp Source  Temporal     SpO2 95 %     Weight      Height      Head Circumference      Peak Flow      Pain Score      Pain Loc      Pain Edu?      Excl. in [place]?     Constitutional: Alert and oriented. Well appearing and in no acute distress. Eyes: Conjunctivae are normal. PERRL. EOMI. Head: Atraumatic. Nose: No congestion/rhinnorhea. Mouth/Throat: Mucous membranes are moist.  Oropharynx non-erythematous. Neck: No stridor.  No meningeal signs.   Cardiovascular: Normal rate, regular rhythm. Good peripheral circulation. Grossly normal heart sounds.   Respiratory: Tachypneic respiratory effort with mild distress.  No retractions. Lungs with diffuse wheezing and crackles in the right upper lobe even after coughing.  His productive cough and expectorates thick yellowish opaque sputum. Gastrointestinal: Soft and nontender. No distention.  Musculoskeletal: No lower extremity tenderness nor edema. No gross deformities of extremities. Neurologic:  Normal speech and language. No gross focal neurologic deficits are appreciated.  Skin:  Skin is warm, dry and intact. No rash noted.   ____________________________________________   LABS (all labs ordered are listed, but only abnormal results are displayed)  Labs Reviewed  COMPREHENSIVE METABOLIC PANEL - Abnormal; Notable for the following components:      Result Value   CO2 18 (*)    Glucose, Bld 129 (*)    BUN 68 (*)    Creatinine, Ser 2.02 (*)    Total Protein 6.3 (*)    Albumin 3.4 (*)    AST 52 (*)    GFR calc non Af Amer 26 (*)    GFR calc Af Amer 30 (*)    Anion gap 18 (*)    All other components within normal limits  CBC WITH DIFFERENTIAL/PLATELET - Abnormal; Notable for the following components:   WBC 13.5 (*)    Neutro Abs 9.2 (*)    Monocytes Absolute 1.3 (*)    All other components within normal limits  TROPONIN I - Abnormal; Notable for the following components:   Troponin I 0.21 (*)    All other components within normal limits    BRAIN NATRIURETIC PEPTIDE - Abnormal; Notable for the following components:   B Natriuretic Peptide 353.5 (*)    All other components within normal limits  I-STAT CG4 LACTIC ACID, ED - Abnormal; Notable for the following components:   Lactic Acid, Venous 4.00 (*)    All other components within normal limits  I-STAT CG4 LACTIC ACID, ED - Abnormal; Notable for the following components:   Lactic Acid, Venous 4.14 (*)    All other components within normal limits  CULTURE, BLOOD (ROUTINE X 2)  CULTURE, BLOOD (ROUTINE X 2)  CULTURE, RESPIRATORY (NON-EXPECTORATED)  RESPIRATORY PANEL BY PCR  URINALYSIS, ROUTINE W REFLEX MICROSCOPIC  INFLUENZA PANEL BY PCR (TYPE A & B)  PROCALCITONIN  CBC  CREATININE, SERUM   ____________________________________________  EKG   EKG Interpretation  Date/Time:  Thursday March 20 2017 08:51:53 EST Ventricular Rate:  66 PR Interval:    QRS Duration: 165 QT Interval:  457 QTC Calculation: 479 R Axis:   -55 Text Interpretation:  Sinus rhythm Ventricular premature complex Borderline prolonged PR interval Probable left atrial enlargement Left bundle branch block quicker rate, no other changes from previous Confirmed by Merrily Pew [phone]) on 03/20/2017 9:33:09 AM       ____________________________________________  RADIOLOGY  Ct Abdomen Pelvis Wo Contrast  Result Date: 03/20/2017 CLINICAL DATA:  Abdominal distension.  Concern for diverticulitis. EXAM: CT ABDOMEN AND PELVIS WITHOUT CONTRAST TECHNIQUE: Multidetector CT imaging of the abdomen and pelvis was performed following the standard protocol without IV contrast. COMPARISON:  Abdominal ultrasound 10/06/2014 FINDINGS: Lower chest: Mild scarring in the lung bases. Punctate calcified granuloma in the right lower lobe. No pleural effusion. Coronary artery atherosclerosis. Hepatobiliary: Punctate calcified granuloma in the right hepatic lobe. No significant biliary dilatation status post cholecystectomy.  Pancreas: Mild diffuse atrophy. No ductal dilatation or inflammatory change. Spleen: Unremarkable. Adrenals/Urinary Tract: Unremarkable adrenal glands. Lobular renal contour with cortical thinning bilaterally. 10 mm posterior left lower pole renal cyst. No renal calculi or hydronephrosis. Grossly unremarkable bladder within limitations of streak artifact. Stomach/Bowel: The stomach is within normal limits. A moderate-sized diverticulum is noted arising from the distal second portion of the duodenum without surrounding inflammation. There is no evidence of bowel obstruction. There is extensive sigmoid colon diverticulosis without evidence of diverticulitis. The appendix is unremarkable. Vascular/Lymphatic: Abdominal aortic atherosclerosis without aneurysm. Atherosclerotic and minimally ectatic common iliac arteries. No enlarged lymph nodes. Reproductive: Unremarkable prostate. Other: No intraperitoneal free fluid. Minimal nonspecific haziness in the central mesentery without mass or fluid collection. Tiny fat containing umbilical hernia. Musculoskeletal: Right hip arthroplasty with associated streak artifact. Diffuse flowing anterior vertebral ossification and posterior element ankylosis in the lower thoracic and lumbar spine extending to the sacrum. Partial ankylosis of both SI joints. IMPRESSION: 1. No acute abnormality identified in the abdomen or pelvis. 2. Extensive sigmoid colon diverticulosis without diverticulitis. 3.  Aortic Atherosclerosis (ICD10-I70.0). Electronically Signed   By: Logan Bores M.D.   On: 03/20/2017 13:16   Dg Chest Portable 1 View  Result Date: 03/20/2017 CLINICAL DATA:  Shortness of breath, hypertension EXAM: PORTABLE CHEST 1 VIEW COMPARISON:  03/17/2017 FINDINGS: Prior CABG. Heart is normal size. Minimal bibasilar scarring or atelectasis. No visible effusions. No acute bony abnormality. IMPRESSION: Bibasilar scarring or atelectasis. Electronically Signed   By: Rolm Baptise M.D.   On:  03/20/2017 10:27    ____________________________________________   INITIAL IMPRESSION / ASSESSMENT AND PLAN / ED COURSE  Worry for CHF but more likely superimposed bacterial pneumonia. Plan for workup for same.   Lactic acid elevated, fluids and abx started.   Wbc count elevated - likely pneumonia. Will likely need admission for same.   persisently sob with productive cough. Unsure of cause at this time. Persistently elevated lactics concern me for PNA. WBC elevated, could be prednisone. Slight AKI. Fluids given.   Discussed with medicine who will b4ring in for obs and reevaluations.  Pertinent labs & imaging results that were available during my care of the patient were reviewed by me and considered in my medical decision making (see chart for details).  ____________________________________________  FINAL CLINICAL IMPRESSION(S) / ED DIAGNOSES  Final diagnoses:  AKI (acute kidney injury) ([place])  Cough  Elevated lactic acid level  Shortness of breath  Bronchitis     MEDICATIONS GIVEN DURING THIS VISIT:  Medications  albuterol (PROVENTIL,VENTOLIN) solution continuous neb (0 mg/hr Nebulization Stopped 03/20/17 1109)  iopamidol (ISOVUE-300) 61 % injection (not administered)  acetaminophen (TYLENOL) tablet 500-1,000 mg (not administered)  dextromethorphan-guaiFENesin (MUCINEX DM) 30-600 MG per 12 hr tablet 1 tablet (not administered)  docusate sodium (COLACE) capsule 100 mg (not administered)  fluticasone (FLONASE) 50 MCG/ACT nasal spray 2 spray (not administered)  hydroxypropyl methylcellulose / hypromellose (ISOPTO TEARS / GONIOVISC) 2.5 % ophthalmic solution 1 drop (not administered)  latanoprost (XALATAN) 0.005 % ophthalmic solution 1 drop (not administered)  losartan (COZAAR) tablet 100 mg (not administered)  magnesium oxide (MAG-OX) tablet 400 mg (not administered)  multivitamin with minerals tablet 1 tablet (not administered)  pantoprazole (PROTONIX) EC tablet 40 mg  (not administered)  predniSONE (DELTASONE) tablet 10 mg (not administered)  enoxaparin (LOVENOX) injection 40 mg (not administered)  0.9 % NaCl with KCl 20 mEq/ L  infusion (not administered)  ibuprofen (ADVIL,MOTRIN) tablet 600 mg (not administered)  ketorolac (TORADOL) 15 MG/ML injection 15 mg (not administered)  senna (SENOKOT) tablet 8.6 mg (not administered)  bisacodyl (DULCOLAX) EC tablet 5 mg (not administered)  ondansetron (ZOFRAN) tablet 4 mg (not administered)    Or  ondansetron (ZOFRAN) injection 4 mg (not administered)  Dextran 70-Hypromellose (PF) 0.1-0.3 % SOLN 2 drop (not administered)  levofloxacin (LEVAQUIN) IVPB 750 mg (0 mg Intravenous Stopped 03/20/17 1134)  sodium chloride 0.9 % bolus 1,000 mL (0 mLs Intravenous Stopped 03/20/17 1221)  ipratropium (ATROVENT) nebulizer solution 1 mg (1 mg Nebulization Given 03/20/17 1008)  predniSONE (DELTASONE) tablet 60 mg (60 mg Oral Given 03/20/17 1003)  aspirin chewable tablet 324 mg (324 mg Oral Given 03/20/17 1103)  sodium chloride 0.9 % bolus 1,000 mL (0 mLs Intravenous Stopped 03/20/17 1406)     NEW OUTPATIENT MEDICATIONS STARTED DURING THIS VISIT:  New Prescriptions   No medications on file    Note:  This note was prepared with assistance of Dragon voice recognition software. Occasional wrong-word or sound-a-like substitutions may have occurred due to the inherent limitations of voice recognition software.   Merrily Pew, MD 03/20/17 [phone]

## 2017-03-20 NOTE — H&P (Signed)
History and Physical    [name] PFX:[id number] DOB: 12/15/1918 DOA: 03/20/2017  PCP: Clovia Cuff, MD  Patient coming from: ALF  I have personally briefly reviewed patient's old medical records in [place]  Chief Complaint: Shortness of breath after feeling weak at home this morning  HPI: Lawrence Warner is a 84 y.o. male with medical history significant of [place] artery disease status post remote CABG, chronic kidney disease stage III, hypertension who presented to the emergency department due to complaints of shortness of breath and weakness.  Sleep presented on 18 January with upper respiratory symptoms was evaluated prescribed Mucinex worsening shortness of breath he was admitted to the hospital on January 18 and found to have upper respiratory infection with bronchospasm and acute hypoxemic respiratory failure.  He also had an episode of orchitis.  He was admitted for 5 days and discharged yesterday.  He does state that he was supposed to get home health which he believes is some kind of 24-hour nursing service with a meet you at your house and take care of you in your home.  I explained to him that that is not the case.  The patient was offered skilled nursing by physical therapy as documented in 1 of their notes but he refused.  So he returned home to his assisted living facility and at 84 years old is very weak and was unable to function and is returning to [place] today with what he states is increasing shortness of breath.  However at discharge it was documented that the patient was still coughing and bringing up sputum production.  The ER physician is concerned because his white blood cell count is elevated however he is on steroids, his lactic acid is also elevated, his troponin is slightly elevated, however he is in worsening renal function with a creatinine of 2.0.  While I do not think that the patient has any new medical issues given his age and the ER doctor is concerned  about a possible respiratory infection the patient will be admitted into the hospital.   Review of Systems: As per HPI otherwise 10 point review of systems negative.    Past Medical History:  Diagnosis Date  . BPH (benign prostatic hypertrophy)   . Chronic heart failure ([place])   . Chronic renal insufficiency   . Coronary artery disease    CABG in 2000  . Diabetes type 2, controlled ([place])    Diet-controlled  . Diabetic neuropathy ([place])   . Dyslipidemia   . Hypertension   . ITP (idiopathic thrombocytopenic purpura)   . Osteoarthritis   . Pneumonia     Past Surgical History:  Procedure Laterality Date  . CHOLECYSTECTOMY    . CORONARY ARTERY BYPASS GRAFT  2000  . Right hip replacement  2014  . TONSILLECTOMY       reports that he has quit smoking. He has a 50.00 pack-year smoking history. he has never used smokeless tobacco. He reports that he does not drink alcohol or use drugs.  Allergies  Allergen Reactions  . Alfuzosin Hcl Er Other (See Comments)    Confusion/Uroxatral   . Imdur [Isosorbide Dinitrate]     REACTION NOT RECALLED  . Penicillins Hives and Other (See Comments)    Has patient had a PCN reaction causing immediate rash, facial/tongue/throat swelling, SOB or lightheadedness with hypotension: Yes Has patient had a PCN reaction causing severe rash involving mucus membranes or skin necrosis: Unknown Has patient had a PCN reaction that required  hospitalization: Unknown Has patient had a PCN reaction occurring within the last 10 years: Unknown If all of the above answers are "NO", then may proceed with Cephalosporin use.   . Tape     SKIN IS VERY THIN AND WILL TEAR EASILY    Family History  Problem Relation Age of Onset  . Lung disease Father   . Cancer Brother    U Prior to Admission medications   Medication Sig Start Date End Date Taking? Authorizing Provider  acetaminophen (TYLENOL) 500 MG tablet Take 500-1,000 mg by mouth daily as needed for mild pain  or headache.    Yes [provider]  albuterol (PROAIR HFA) 108 (90 Base) MCG/ACT inhaler Inhale 1 puff into the lungs every 6 (six) hours as needed for wheezing.    Yes [provider]  Dextran 70-Hypromellose (ARTIFICIAL TEARS PF OP) Place 1 drop into both eyes 2 (two) times daily.    Yes [provider]  dextromethorphan-guaiFENesin (MUCINEX DM) 30-600 MG 12hr tablet Take 1 tablet by mouth 2 (two) times daily for 5 days. 03/19/17 03/24/17 Yes Rosita Fire, MD  docusate sodium (COLACE) 100 MG capsule Take 1 capsule (100 mg total) by mouth 2 (two) times daily as needed for mild constipation. 03/19/17  Yes Rosita Fire, MD  losartan (COZAAR) 100 MG tablet Take 100 mg by mouth daily.   Yes [provider]  magnesium oxide (MAG-OX) 400 MG tablet Take 400 mg daily by mouth.   Yes [provider]  Multiple Vitamin (MULTIVITAMIN WITH MINERALS) TABS tablet Take 1 tablet by mouth daily.   Yes [provider]  omeprazole (PRILOSEC) 20 MG capsule Take 1 capsule (20 mg total) by mouth daily before breakfast. 03/19/17  Yes Rosita Fire, MD  predniSONE (DELTASONE) 10 MG tablet Take 1 tablet (10 mg total) by mouth daily. 3 tabs for 2 days, 2 tabs for 2 days and 1 tab for 2 days and then stop. 03/19/17  Yes Rosita Fire, MD  fluticasone [facility]) 50 MCG/ACT nasal spray Place 2 sprays into both nostrils 2 (two) times daily. For 7days 07/26/16   Domenic Polite, MD  hydroxypropyl methylcellulose / hypromellose (ISOPTO TEARS / GONIOVISC) 2.5 % ophthalmic solution Place 1 drop into both eyes 3 (three) times daily as needed for dry eyes.    [provider]  latanoprost (XALATAN) 0.005 % ophthalmic solution Place 1 drop into the left eye at bedtime.    [provider]  simvastatin (ZOCOR) 20 MG tablet Take 20 mg by mouth at bedtime.    [provider]    Physical Exam: Vitals:   03/20/17 1200 03/20/17 1215  03/20/17 1245 03/20/17 1300  BP: (!) 119/59 (!) 112/51 (!) 125/50 (!) 134/55  Pulse: 92 100 93   Resp: (!) 26 (!) 23 (!) 23 (!) 28  Temp:      TempSrc:      SpO2: 93% 94% 95%    .TCS Constitutional: NAD, calm, comfortable Vitals:   03/20/17 1200 03/20/17 1215 03/20/17 1245 03/20/17 1300  BP: (!) 119/59 (!) 112/51 (!) 125/50 (!) 134/55  Pulse: 92 100 93   Resp: (!) 26 (!) 23 (!) 23 (!) 28  Temp:      TempSrc:      SpO2: 93% 94% 95%    Eyes: PERRL, lids and conjunctivae normal ENMT: Mucous membranes are moist. Posterior pharynx clear of any exudate or lesions.Normal dentition.  Neck: normal, supple, no masses, no thyromegaly Respiratory:  Minimal rhonchi but otherwise clear.  No wheezing or rales are appreciated good air movement. Cardiovascular: Regular rate and rhythm, no murmurs / rubs / gallops. No extremity edema. 2+ pedal pulses. No carotid bruits.  Abdomen: no tenderness, no masses palpated. No hepatosplenomegaly. Bowel sounds positive.  Musculoskeletal: no clubbing / cyanosis. No joint deformity upper and lower extremities. Good ROM, no contractures. Normal muscle tone.  Skin: no rashes, lesions, ulcers. No induration Neurologic: CN 2-12 grossly intact. Sensation intact, DTR normal. Strength 5/5 in all 4.  Psychiatric: Normal judgment and insight. Alert and oriented x 3. Normal mood.     Labs on Admission: I have personally reviewed following labs and imaging studies  CBC: Recent Labs  Lab 03/14/17 1526 03/20/17 0915  WBC 8.5 13.5*  NEUTROABS  --  9.2*  HGB 12.8* 15.7  HCT 39.7 48.4  MCV 94.1 94.9  PLT 168 662   Basic Metabolic Panel: Recent Labs  Lab 03/14/17 1526 03/15/17 0635 03/20/17 0915  NA 137 136 138  K 4.3 4.1 4.4  CL 106 103 102  CO2 21* 21* 18*  GLUCOSE 174* 203* 129*  BUN 20 28* 68*  CREATININE 1.47* 1.58* 2.02*  CALCIUM 8.9 9.2 9.3   GFR: Estimated Creatinine Clearance: 22.7 mL/min (A) (by C-G formula based on SCr of 2.02 mg/dL  (H)). Liver Function Tests: Recent Labs  Lab 03/20/17 0915  AST 52*  ALT 63  ALKPHOS 59  BILITOT 1.2  PROT 6.3*  ALBUMIN 3.4*   No results for input(s): LIPASE, AMYLASE in the last 168 hours. No results for input(s): AMMONIA in the last 168 hours. Coagulation Profile: No results for input(s): INR, PROTIME in the last 168 hours. Cardiac Enzymes: Recent Labs  Lab 03/20/17 0915  TROPONINI 0.21*   BNP (last 3 results) No results for input(s): PROBNP in the last 8760 hours. HbA1C: No results for input(s): HGBA1C in the last 72 hours. CBG: Recent Labs  Lab 03/18/17 1137 03/18/17 1634 03/18/17 2136 03/19/17 0727 03/19/17 1151  GLUCAP 269* 213* 164* 151* 229*   Lipid Profile: No results for input(s): CHOL, HDL, LDLCALC, TRIG, CHOLHDL, LDLDIRECT in the last 72 hours. Thyroid Function Tests: No results for input(s): TSH, T4TOTAL, FREET4, T3FREE, THYROIDAB in the last 72 hours. Anemia Panel: No results for input(s): VITAMINB12, FOLATE, FERRITIN, TIBC, IRON, RETICCTPCT in the last 72 hours. Urine analysis:    Component Value Date/Time   COLORURINE YELLOW 07/27/2016 1702   APPEARANCEUR CLEAR 07/27/2016 1702   LABSPEC 1.019 07/27/2016 1702   PHURINE 5.0 07/27/2016 1702   GLUCOSEU NEGATIVE 07/27/2016 1702   HGBUR NEGATIVE 07/27/2016 1702   BILIRUBINUR NEGATIVE 07/27/2016 1702   KETONESUR NEGATIVE 07/27/2016 1702   PROTEINUR NEGATIVE 07/27/2016 1702   NITRITE NEGATIVE 07/27/2016 1702   LEUKOCYTESUR NEGATIVE 07/27/2016 1702    Radiological Exams on Admission: Ct Abdomen Pelvis Wo Contrast  Result Date: 03/20/2017 CLINICAL DATA:  Abdominal distension.  Concern for diverticulitis. EXAM: CT ABDOMEN AND PELVIS WITHOUT CONTRAST TECHNIQUE: Multidetector CT imaging of the abdomen and pelvis was performed following the standard protocol without IV contrast. COMPARISON:  Abdominal ultrasound 10/06/2014 FINDINGS: Lower chest: Mild scarring in the lung bases. Punctate calcified  granuloma in the right lower lobe. No pleural effusion. Coronary artery atherosclerosis. Hepatobiliary: Punctate calcified granuloma in the right hepatic lobe. No significant biliary dilatation status post cholecystectomy. Pancreas: Mild diffuse atrophy. No ductal dilatation or inflammatory change. Spleen: Unremarkable. Adrenals/Urinary Tract: Unremarkable adrenal glands. Lobular renal contour with cortical thinning bilaterally. 10 mm  posterior left lower pole renal cyst. No renal calculi or hydronephrosis. Grossly unremarkable bladder within limitations of streak artifact. Stomach/Bowel: The stomach is within normal limits. A moderate-sized diverticulum is noted arising from the distal second portion of the duodenum without surrounding inflammation. There is no evidence of bowel obstruction. There is extensive sigmoid colon diverticulosis without evidence of diverticulitis. The appendix is unremarkable. Vascular/Lymphatic: Abdominal aortic atherosclerosis without aneurysm. Atherosclerotic and minimally ectatic common iliac arteries. No enlarged lymph nodes. Reproductive: Unremarkable prostate. Other: No intraperitoneal free fluid. Minimal nonspecific haziness in the central mesentery without mass or fluid collection. Tiny fat containing umbilical hernia. Musculoskeletal: Right hip arthroplasty with associated streak artifact. Diffuse flowing anterior vertebral ossification and posterior element ankylosis in the lower thoracic and lumbar spine extending to the sacrum. Partial ankylosis of both SI joints. IMPRESSION: 1. No acute abnormality identified in the abdomen or pelvis. 2. Extensive sigmoid colon diverticulosis without diverticulitis. 3.  Aortic Atherosclerosis (ICD10-I70.0). Electronically Signed   By: Logan Bores M.D.   On: 03/20/2017 13:16   Dg Chest Portable 1 View  Result Date: 03/20/2017 CLINICAL DATA:  Shortness of breath, hypertension EXAM: PORTABLE CHEST 1 VIEW COMPARISON:  03/17/2017 FINDINGS:  Prior CABG. Heart is normal size. Minimal bibasilar scarring or atelectasis. No visible effusions. No acute bony abnormality. IMPRESSION: Bibasilar scarring or atelectasis. Electronically Signed   By: Rolm Baptise M.D.   On: 03/20/2017 10:27    EKG: Independently reviewed. Sinus rhythm Ventricular premature complex Borderline prolonged PR interval Probable left atrial enlargement Left bundle branch block quicker rate, no other changes from previous  Assessment/Plan Active Problems:   Productive cough   1.  Productive cough with elevated white blood cell count and mildly elevated creatinine: Place patient in overnight observation as he is concerned about going home.  I will ask physical therapy to evaluate him given his current condition.  I believe the patient will be best served in rehab and probably should have been discharged to rehab but he declined.  At this point he may be more open to that as he now realizes that home health does not provide the patient with any substantial amount of personal care.  We will check a respiratory viral panel, pro-calcitonin, the patient did receive antibiotics in the emergency department.  I will not order any further until we determine whether or not there is an actual infection.  Please note the chest x-ray did not show any infiltrate.  With regards to his abnormal troponins admit and has a very elevated creatinine and probably has some decreased clearing of his troponin.  Lactic acid is slightly elevated and has been treated with some IV fluids we will recheck that.  We will monitor closely.  Overall I feel the patient will likely be able to be discharged in the next 24 hours.   DVT prophylaxis: Lovenox Code Status: DNR Family Communication: Patient retains capacity and the family was not present on admission Disposition Plan: Likely discharge to SNF in the next 24 hours. Consults called: Case management Admission status: Observation   Lady Deutscher MD FACP [facility] Pager [phone]  If 7PM-7AM, please contact night-coverage [email or web address] Password [facility]  03/20/2017, 2:48 PM

## 2017-03-20 NOTE — ED Notes (Signed)
Pt offered warm blankets due to temperature but refused. Room temp increased.

## 2017-03-20 NOTE — ED Notes (Addendum)
ED Provider at bedside. 

## 2017-03-20 NOTE — ED Notes (Signed)
Pt aware of need for urine sample.  

## 2017-03-20 NOTE — ED Notes (Signed)
Pt attempted to urinate and stood to do so with assistance. Pt tolerated however no urine was obtained.

## 2017-03-20 NOTE — Progress Notes (Signed)
Pt arrived to [id number] via stretcher.  Pt alert and oriented. VSS.  Will continue to monitor.  Cori Razor, RN

## 2017-03-20 NOTE — ED Notes (Signed)
Main lab called by this RN to check on CMP results. Lab to follow up

## 2017-03-21 ENCOUNTER — Other Ambulatory Visit: Payer: Self-pay

## 2017-03-21 ENCOUNTER — Observation Stay (HOSPITAL_COMMUNITY): Payer: Medicare Other

## 2017-03-21 DIAGNOSIS — E875 Hyperkalemia: Secondary | ICD-10-CM | POA: Diagnosis not present

## 2017-03-21 DIAGNOSIS — J9691 Respiratory failure, unspecified with hypoxia: Secondary | ICD-10-CM | POA: Diagnosis not present

## 2017-03-21 DIAGNOSIS — R05 Cough: Secondary | ICD-10-CM | POA: Diagnosis not present

## 2017-03-21 DIAGNOSIS — I10 Essential (primary) hypertension: Secondary | ICD-10-CM | POA: Diagnosis not present

## 2017-03-21 DIAGNOSIS — J209 Acute bronchitis, unspecified: Secondary | ICD-10-CM

## 2017-03-21 DIAGNOSIS — R2681 Unsteadiness on feet: Secondary | ICD-10-CM | POA: Diagnosis not present

## 2017-03-21 DIAGNOSIS — Z5189 Encounter for other specified aftercare: Secondary | ICD-10-CM | POA: Diagnosis not present

## 2017-03-21 DIAGNOSIS — I110 Hypertensive heart disease with heart failure: Secondary | ICD-10-CM | POA: Diagnosis not present

## 2017-03-21 DIAGNOSIS — N179 Acute kidney failure, unspecified: Secondary | ICD-10-CM | POA: Diagnosis not present

## 2017-03-21 DIAGNOSIS — M6281 Muscle weakness (generalized): Secondary | ICD-10-CM | POA: Diagnosis not present

## 2017-03-21 DIAGNOSIS — I509 Heart failure, unspecified: Secondary | ICD-10-CM | POA: Diagnosis not present

## 2017-03-21 DIAGNOSIS — I5030 Unspecified diastolic (congestive) heart failure: Secondary | ICD-10-CM | POA: Diagnosis not present

## 2017-03-21 DIAGNOSIS — J211 Acute bronchiolitis due to human metapneumovirus: Secondary | ICD-10-CM | POA: Diagnosis not present

## 2017-03-21 DIAGNOSIS — Z79899 Other long term (current) drug therapy: Secondary | ICD-10-CM | POA: Diagnosis not present

## 2017-03-21 DIAGNOSIS — J205 Acute bronchitis due to respiratory syncytial virus: Secondary | ICD-10-CM | POA: Diagnosis not present

## 2017-03-21 DIAGNOSIS — E119 Type 2 diabetes mellitus without complications: Secondary | ICD-10-CM | POA: Diagnosis not present

## 2017-03-21 DIAGNOSIS — J40 Bronchitis, not specified as acute or chronic: Secondary | ICD-10-CM | POA: Diagnosis not present

## 2017-03-21 DIAGNOSIS — D649 Anemia, unspecified: Secondary | ICD-10-CM | POA: Diagnosis not present

## 2017-03-21 DIAGNOSIS — R54 Age-related physical debility: Secondary | ICD-10-CM | POA: Diagnosis not present

## 2017-03-21 DIAGNOSIS — I2510 Atherosclerotic heart disease of native coronary artery without angina pectoris: Secondary | ICD-10-CM | POA: Diagnosis not present

## 2017-03-21 LAB — BASIC METABOLIC PANEL
ANION GAP: 9 (ref 5–15)
BUN: 56 mg/dL — ABNORMAL HIGH (ref 6–20)
CALCIUM: 8.7 mg/dL — AB (ref 8.9–10.3)
CO2: 22 mmol/L (ref 22–32)
CREATININE: 1.59 mg/dL — AB (ref 0.61–1.24)
Chloride: 108 mmol/L (ref 101–111)
GFR calc Af Amer: 41 mL/min — ABNORMAL LOW (ref >60)
GFR, EST NON AFRICAN AMERICAN: 35 mL/min — AB (ref >60)
GLUCOSE: 154 mg/dL — AB (ref 65–99)
Potassium: 5.2 mmol/L — ABNORMAL HIGH (ref 3.5–5.1)
Sodium: 139 mmol/L (ref 135–145)

## 2017-03-21 LAB — CBC
HCT: 40.2 % (ref 39.0–52.0)
HEMOGLOBIN: 12.7 g/dL — AB (ref 13.0–17.0)
MCH: 30 pg (ref 26.0–34.0)
MCHC: 31.6 g/dL (ref 30.0–36.0)
MCV: 95 fL (ref 78.0–100.0)
PLATELETS: 216 10*3/uL (ref 150–400)
RBC: 4.23 MIL/uL (ref 4.22–5.81)
RDW: 14 % (ref 11.5–15.5)
WBC: 10.5 10*3/uL (ref 4.0–10.5)

## 2017-03-21 LAB — PROCALCITONIN

## 2017-03-21 NOTE — NC FL2 (Signed)
[place] MEDICAID FL2 LEVEL OF CARE SCREENING TOOL     IDENTIFICATION  Patient Name: Lawrence Warner Birthdate: Jun 18, 1918 Sex: male Admission Date (Current Location): 03/20/2017  [facility] and [place] Number:  Herbalist and Address:  [facility], [place] [address], [place], [place] [address]      Provider Number: [id number]  Attending Physician Name and Address:  Patrecia Pour, Christean Grief, MD  Relative Name and Phone Number:       Current Level of Care: Hospital Recommended Level of Care: [place] Prior Approval Number:    Date Approved/Denied:   PASRR Number: [id number]  Discharge Plan: SNF    Current Diagnoses: Patient Active Problem List   Diagnosis Date Noted  . Productive cough 03/20/2017  . RSV bronchitis   . Bronchospasm with bronchitis, acute 03/14/2017  . Diastolic congestive heart failure ([place]) 11/26/2016  . CRI (chronic renal insufficiency), stage 3 (moderate) ([place]) 11/26/2016  . LBBB (left bundle branch block) 11/26/2016  . Hypoxia   . Acute respiratory failure with hypoxia ([place]) 07/20/2016  . Respiratory failure ([place]) 02/28/2016  . Acute respiratory failure with hypoxemia ([place]) 02/28/2016  . Dyslipidemia 02/28/2016  . AKI (acute kidney injury) ([place])   . Influenza   . Sepsis ([place])   . Constipation 12/07/2015  . Bradycardia 11/28/2015  . Port catheter in place 10/24/2015  . GERD (gastroesophageal reflux disease) 10/09/2015  . Gait instability 10/09/2015  . Chest pain 10/05/2015  . Diet-controlled diabetes mellitus ([place]) 05/07/2015  . Hx of CABG 05/07/2015  . Essential hypertension 05/07/2015  . Muscle cramps 04/25/2015  . Shortness of breath 04/11/2015  . Bronchitis 03/14/2015  . Idiopathic thrombocytopenic purpura ([place]) 09/27/2014    Orientation RESPIRATION BLADDER Height & Weight     Self, Situation, Time, Place  O2([place] 2.5 L) Continent Weight:   Height:     BEHAVIORAL SYMPTOMS/MOOD  NEUROLOGICAL BOWEL NUTRITION STATUS      Continent Diet(heart healthy)  AMBULATORY STATUS COMMUNICATION OF NEEDS Skin   Extensive Assist Verbally Normal                       Personal Care Assistance Level of Assistance  Bathing, Feeding, Dressing Bathing Assistance: Limited assistance Feeding assistance: Independent Dressing Assistance: Limited assistance     Functional Limitations Info  Sight, Hearing, Speech Sight Info: Impaired Hearing Info: Impaired Speech Info: Adequate    SPECIAL CARE FACTORS FREQUENCY  PT (By licensed PT), OT (By licensed OT)     PT Frequency: 5x/wk OT Frequency: 5x/wk            Contractures Contractures Info: Not present    Additional Factors Info  Code Status, Allergies, Isolation Precautions Code Status Info: DNR Allergies Info: Alfuzosin Hcl Er, Imdur Isosorbide Dinitrate, Penicillins, Tape     Isolation Precautions Info: Droplet precautions     Current Medications (03/21/2017):  This is the current hospital active medication list Current Facility-Administered Medications  Medication Dose Route Frequency Provider Last Rate Last Dose  . acetaminophen (TYLENOL) tablet 500-1,000 mg  500-1,000 mg Oral Daily PRN Lady Deutscher, MD   500 mg at 03/20/17 2219  . albuterol (PROVENTIL,VENTOLIN) solution continuous neb  10 mg/hr Nebulization Continuous Mesner, Corene Cornea, MD   Stopped at 03/20/17 1109  . bisacodyl (DULCOLAX) EC tablet 5 mg  5 mg Oral Daily PRN Lady Deutscher, MD      . Dextran 70-Hypromellose (PF) 0.1-0.3 % SOLN 2 drop  2 drop  Ophthalmic TID Lady Deutscher, MD      . dextromethorphan-guaiFENesin [facility]) 30-600 MG per 12 hr tablet 1 tablet  1 tablet Oral BID Lady Deutscher, MD   1 tablet at 03/20/17 1722  . docusate sodium (COLACE) capsule 100 mg  100 mg Oral BID PRN Lady Deutscher, MD      . enoxaparin (LOVENOX) injection 30 mg  30 mg Subcutaneous Q24H Lady Deutscher, MD      . fluticasone [facility]) 50  MCG/ACT nasal spray 2 spray  2 spray Each Nare BID Lady Deutscher, MD   2 spray at 03/20/17 2011  . ibuprofen (ADVIL,MOTRIN) tablet 600 mg  600 mg Oral Q6H PRN Lady Deutscher, MD      . ketorolac (TORADOL) 15 MG/ML injection 15 mg  15 mg Intravenous Q6H PRN Lady Deutscher, MD      . latanoprost (XALATAN) 0.005 % ophthalmic solution 1 drop  1 drop Left Eye QHS Lady Deutscher, MD   1 drop at 03/20/17 2212  . losartan (COZAAR) tablet 100 mg  100 mg Oral Daily Lady Deutscher, MD   100 mg at 03/20/17 1721  . magnesium oxide (MAG-OX) tablet 400 mg  400 mg Oral Daily Lady Deutscher, MD   400 mg at 03/20/17 1722  . multivitamin with minerals tablet 1 tablet  1 tablet Oral Daily Lady Deutscher, MD   1 tablet at 03/20/17 1721  . ondansetron (ZOFRAN) tablet 4 mg  4 mg Oral Q6H PRN Lady Deutscher, MD       Or  . ondansetron [facility]) injection 4 mg  4 mg Intravenous Q6H PRN Lady Deutscher, MD      . pantoprazole (PROTONIX) EC tablet 40 mg  40 mg Oral Daily Lady Deutscher, MD   40 mg at 03/20/17 1721  . polyvinyl alcohol (LIQUIFILM TEARS) 1.4 % ophthalmic solution 1 drop  1 drop Both Eyes TID PRN Lady Deutscher, MD   1 drop at 03/20/17 2213  . predniSONE (DELTASONE) tablet 10 mg  10 mg Oral Daily Lady Deutscher, MD   10 mg at 03/20/17 1721  . senna (SENOKOT) tablet 8.6 mg  1 tablet Oral BID Lady Deutscher, MD   8.6 mg at 03/20/17 1721   Facility-Administered Medications Ordered in Other Encounters  Medication Dose Route Frequency Provider Last Rate Last Dose  . cyanocobalamin ((VITAMIN B-12)) injection 1,000 mcg  1,000 mcg Subcutaneous Q30 days [name], MD   1,000 mcg at 03/13/16 1348  . cyanocobalamin ((VITAMIN B-12)) injection 1,000 mcg  1,000 mcg Subcutaneous Q30 days [name], MD   1,000 mcg at 05/14/16 1001  . cyanocobalamin ((VITAMIN B-12)) injection 1,000 mcg  1,000 mcg Subcutaneous Q30 days [name], MD   1,000 mcg  at 09/10/16 1022  . cyanocobalamin ((VITAMIN B-12)) injection 1,000 mcg  1,000 mcg Subcutaneous Q30 days [name], MD   1,000 mcg at 12/03/16 1038  . romiPLOStim (NPLATE) injection 50 mcg  0.5 mcg/kg Subcutaneous Weekly [name], MD   50 mcg at 03/13/16 1349  . romiPLOStim (NPLATE) injection 50 mcg  50 mcg Subcutaneous Weekly [name], MD   50 mcg at 05/07/16 1106  . romiPLOStim (NPLATE) injection 50 mcg  0.5 mcg/kg Subcutaneous Weekly [name], MD   50 mcg at 05/14/16 1001  . romiPLOStim (NPLATE) injection 50 mcg  50 mcg Subcutaneous Weekly [name], MD  50 mcg at 08/13/16 0931  . romiPLOStim (NPLATE) injection 50 mcg  50 mcg Subcutaneous Weekly [name], MD   50 mcg at 08/27/16 5638  . romiPLOStim (NPLATE) injection 50 mcg  50 mcg Subcutaneous Weekly [name], MD   50 mcg at 09/03/16 1027  . romiPLOStim (NPLATE) injection 50 mcg  0.5 mcg/kg Subcutaneous Weekly [name], MD   50 mcg at 09/10/16 1023  . romiPLOStim (NPLATE) injection 50 mcg  0.5 mcg/kg Subcutaneous Weekly [name], MD   50 mcg at 12/03/16 1038  . romiPLOStim (NPLATE) injection 50 mcg  50 mcg Subcutaneous Weekly [name], MD   50 mcg at 02/11/17 1039     Discharge Medications: Please see discharge summary for a list of discharge medications.  Relevant Imaging Results:  Relevant Lab Results:   Additional Information SS#: [id number]  Geralynn Ochs, LCSW

## 2017-03-21 NOTE — Progress Notes (Signed)
Pt discharge education and instructions completed. Pt discharge to [facility] SNF and report called off to nurse [place] at the facility. Pt son to transport pt to disposition. Pt IV and telemetry removed; pt transported off unit via wheelchair with belongings and son to the side. Delia Heady RN

## 2017-03-21 NOTE — Care Management Obs Status (Signed)
[place] NOTIFICATION   Patient Details  Name: Adan Baehr MRN: [id number] Date of Birth: October 31, 1918   Medicare Observation Status Notification Given:  Yes    Carles Collet, RN 03/21/2017, 1:58 PM

## 2017-03-21 NOTE — Clinical Social Work Placement (Addendum)
Nurse to call report to [phone]    CLINICAL SOCIAL WORK PLACEMENT  NOTE  Date:  03/21/2017  Patient Details  Name: Lawrence Warner MRN: [id number] Date of Birth: October 20, 1918  Clinical Social Work is seeking post-discharge placement for this patient at the [place] level of care (*CSW will initial, date and re-position this form in  chart as items are completed):  Yes   Patient/family provided with [facility] Work Department's list of facilities offering this level of care within the geographic area requested by the patient (or if unable, by the patient's family).  Yes   Patient/family informed of their freedom to choose among providers that offer the needed level of care, that participate in Medicare, Medicaid or managed care program needed by the patient, have an available bed and are willing to accept the patient.  Yes   Patient/family informed of [facility]'s ownership interest in [facility] and [facility], as well as of the fact that they are under no obligation to receive care at these facilities.  PASRR submitted to EDS on 03/21/17     PASRR number received on 03/21/17     Existing PASRR number confirmed on       FL2 transmitted to all facilities in geographic area requested by pt/family on 03/21/17     FL2 transmitted to all facilities within larger geographic area on       Patient informed that his/her managed care company has contracts with or will negotiate with certain facilities, including the following:        Yes   Patient/family informed of bed offers received.  Patient chooses bed at [facility]     Physician recommends and patient chooses bed at      Patient to be transferred to [facility] on 03/21/17.  Patient to be transferred to facility by Family car     Patient family notified on 03/21/17 of transfer.  Name of family member notified:  Son     PHYSICIAN       Additional Comment:     _______________________________________________ Geralynn Ochs, LCSW 03/21/2017, 3:27 PM

## 2017-03-21 NOTE — Progress Notes (Signed)
CSW received alert from [facility] that patient will not have a private room and will need to have one due to respiratory virus; cannot accept patient, even though patient has already left the building. CSW alerted MD to check on droplet precautions, and will defer to facility policy.   CSW contacted other facilities with bed offers to determine facilities with private room. Patient will admit to [facility].   Nurse to call report to [phone].  CSW signing off.  Laveda Abbe, [place] Clinical Social Worker [phone]

## 2017-03-21 NOTE — Discharge Summary (Signed)
Physician Discharge Summary  Lawrence Warner  IHK:[id number]  DOB: 1918-04-05  DOA: 03/20/2017 PCP: Clovia Cuff, MD  Admit date: 03/20/2017 Discharge date: 03/21/2017  Admitted From: ALF Disposition:  SNF   Recommendations for Outpatient Follow-up:  1. Follow up with PCP in 1-2 weeks 2. Please obtain BMP/CBC in one week  Equipment/Devices: Oxygen 2L [place]   Discharge Condition: Good  CODE STATUS: DNR  Diet recommendation: Heart Healthy   Brief/Interim Summary: Lawrence Warner is a 84 year old male with medical history of CAD status post CABG, CKD stage III, hypertension presented to the emergency department complaining of weakness and shortness of breath.  Patient was discharged on 1/23 after being diagnosed with acute bronchitis secondary to viral  Infection.  Patient was discharged home after he refused skilled nursing facility on prior discharge now returned back to complaining of weakness and not able to do daily activities.  Upon evaluation patient was found to have slight elevated white count and worsening renal function therefore he was admitted to observe and for skilled nursing placement.  Patient is positive for Metapneumovirus.  PT evaluated patient and recommended SNF for short-term rehab.  Patient was discharged to SNF.  Subjective: Patient seen and examined, report feeling week, still coughing. Afebrile. No acute events overnight.   Discharge Diagnoses/Hospital Course:  Acute viral bronchitis, with acute respiratory failure and hypoxia Vital panel positive for Metapneumovirus - continue supportive treatment  O2 supplementation as needed Continue prednisone No need for antibiotic as it etiology is viral Chest x-ray negative for acute infection, CT negative for PE Pro-calcitonin negative  WBC normalized   Acute on CKD stage III Treated with IV fluid Creatinine down to baseline, upon discharge 1.59  Monitor Cr in 1 week   HTN  BP stable  Continue home  medications without changes   Type 2 DM  CBG's  Follow up with PCP  Physical deconditioning Due to recent hospitalization and vital syndrome Recommending short-term rehab  All other chronic medical condition were stable during the hospitalization.  On the day of the discharge the patient's vitals were stable, and no other acute medical condition were reported by patient. the patient was felt safe to be discharge to SNF  Discharge Instructions  You were cared for by a hospitalist during your hospital stay. If you have any questions about your discharge medications or the care you received while you were in the hospital after you are discharged, you can call the unit and asked to speak with the hospitalist on call if the hospitalist that took care of you is not available. Once you are discharged, your primary care physician will handle any further medical issues. Please note that NO REFILLS for any discharge medications will be authorized once you are discharged, as it is imperative that you return to your primary care physician (or establish a relationship with a primary care physician if you do not have one) for your aftercare needs so that they can reassess your need for medications and monitor your lab values.  Discharge Instructions    Call MD for:  difficulty breathing, headache or visual disturbances   Complete by:  As directed    Call MD for:  hives   Complete by:  As directed    Call MD for:  persistant dizziness or light-headedness   Complete by:  As directed    Call MD for:  persistant nausea and vomiting   Complete by:  As directed    Call MD for:  redness, tenderness, or  signs of infection (pain, swelling, redness, odor or green/yellow discharge around incision site)   Complete by:  As directed    Call MD for:  severe uncontrolled pain   Complete by:  As directed    Call MD for:  temperature >100.4   Complete by:  As directed    Diet - low sodium heart healthy   Complete  by:  As directed    Increase activity slowly   Complete by:  As directed      Allergies as of 03/21/2017      Reactions   Alfuzosin Hcl Er Other (See Comments)   Confusion/Uroxatral   Imdur [isosorbide Dinitrate]    REACTION NOT RECALLED   Penicillins Hives, Other (See Comments)   Has patient had a PCN reaction causing immediate rash, facial/tongue/throat swelling, SOB or lightheadedness with hypotension: Yes Has patient had a PCN reaction causing severe rash involving mucus membranes or skin necrosis: Unknown Has patient had a PCN reaction that required hospitalization: Unknown Has patient had a PCN reaction occurring within the last 10 years: Unknown If all of the above answers are "NO", then may proceed with Cephalosporin use.   Tape    SKIN IS VERY THIN AND WILL TEAR EASILY      Medication List    TAKE these medications   acetaminophen 500 MG tablet Commonly known as:  TYLENOL Take 500-1,000 mg by mouth daily as needed for mild pain or headache.   ARTIFICIAL TEARS PF OP Place 1 drop into both eyes 2 (two) times daily.   dextromethorphan-guaiFENesin 30-600 MG 12hr tablet Commonly known as:  MUCINEX DM Take 1 tablet by mouth 2 (two) times daily for 5 days.   docusate sodium 100 MG capsule Commonly known as:  COLACE Take 1 capsule (100 mg total) by mouth 2 (two) times daily as needed for mild constipation.   fluticasone 50 MCG/ACT nasal spray Commonly known as:  FLONASE Place 2 sprays into both nostrils 2 (two) times daily. For 7days   hydroxypropyl methylcellulose / hypromellose 2.5 % ophthalmic solution Commonly known as:  ISOPTO TEARS / GONIOVISC Place 1 drop into both eyes 3 (three) times daily as needed for dry eyes.   latanoprost 0.005 % ophthalmic solution Commonly known as:  XALATAN Place 1 drop into the left eye at bedtime.   losartan 100 MG tablet Commonly known as:  COZAAR Take 100 mg by mouth daily.   magnesium oxide 400 MG tablet Commonly known  as:  MAG-OX Take 400 mg daily by mouth.   multivitamin with minerals Tabs tablet Take 1 tablet by mouth daily.   omeprazole 20 MG capsule Commonly known as:  PRILOSEC Take 1 capsule (20 mg total) by mouth daily before breakfast.   predniSONE 10 MG tablet Commonly known as:  DELTASONE Take 1 tablet (10 mg total) by mouth daily. 3 tabs for 2 days, 2 tabs for 2 days and 1 tab for 2 days and then stop.   PROAIR HFA 108 (90 Base) MCG/ACT inhaler Generic drug:  albuterol Inhale 1 puff into the lungs every 6 (six) hours as needed for wheezing.   simvastatin 20 MG tablet Commonly known as:  ZOCOR Take 20 mg by mouth at bedtime.      Follow-up Information    Clovia Cuff, MD. Schedule an appointment as soon as possible for a visit.   Specialty:  Internal Medicine Why:  Hospital follow up  Contact information: [place] [address] [phone]  Minus Breeding, MD .   Specialty:  Cardiology Contact information: [address] [place] [address] [phone]          Allergies  Allergen Reactions  . Alfuzosin Hcl Er Other (See Comments)    Confusion/Uroxatral   . Imdur [Isosorbide Dinitrate]     REACTION NOT RECALLED  . Penicillins Hives and Other (See Comments)    Has patient had a PCN reaction causing immediate rash, facial/tongue/throat swelling, SOB or lightheadedness with hypotension: Yes Has patient had a PCN reaction causing severe rash involving mucus membranes or skin necrosis: Unknown Has patient had a PCN reaction that required hospitalization: Unknown Has patient had a PCN reaction occurring within the last 10 years: Unknown If all of the above answers are "NO", then may proceed with Cephalosporin use.   . Tape     SKIN IS VERY THIN AND WILL TEAR EASILY    Consultations:  None   Procedures/Studies: Ct Abdomen Pelvis Wo Contrast  Result Date: 03/20/2017 CLINICAL DATA:  Abdominal  distension.  Concern for diverticulitis. EXAM: CT ABDOMEN AND PELVIS WITHOUT CONTRAST TECHNIQUE: Multidetector CT imaging of the abdomen and pelvis was performed following the standard protocol without IV contrast. COMPARISON:  Abdominal ultrasound 10/06/2014 FINDINGS: Lower chest: Mild scarring in the lung bases. Punctate calcified granuloma in the right lower lobe. No pleural effusion. Coronary artery atherosclerosis. Hepatobiliary: Punctate calcified granuloma in the right hepatic lobe. No significant biliary dilatation status post cholecystectomy. Pancreas: Mild diffuse atrophy. No ductal dilatation or inflammatory change. Spleen: Unremarkable. Adrenals/Urinary Tract: Unremarkable adrenal glands. Lobular renal contour with cortical thinning bilaterally. 10 mm posterior left lower pole renal cyst. No renal calculi or hydronephrosis. Grossly unremarkable bladder within limitations of streak artifact. Stomach/Bowel: The stomach is within normal limits. A moderate-sized diverticulum is noted arising from the distal second portion of the duodenum without surrounding inflammation. There is no evidence of bowel obstruction. There is extensive sigmoid colon diverticulosis without evidence of diverticulitis. The appendix is unremarkable. Vascular/Lymphatic: Abdominal aortic atherosclerosis without aneurysm. Atherosclerotic and minimally ectatic common iliac arteries. No enlarged lymph nodes. Reproductive: Unremarkable prostate. Other: No intraperitoneal free fluid. Minimal nonspecific haziness in the central mesentery without mass or fluid collection. Tiny fat containing umbilical hernia. Musculoskeletal: Right hip arthroplasty with associated streak artifact. Diffuse flowing anterior vertebral ossification and posterior element ankylosis in the lower thoracic and lumbar spine extending to the sacrum. Partial ankylosis of both SI joints. IMPRESSION: 1. No acute abnormality identified in the abdomen or pelvis. 2.  Extensive sigmoid colon diverticulosis without diverticulitis. 3.  Aortic Atherosclerosis (ICD10-I70.0). Electronically Signed   By: Logan Bores M.D.   On: 03/20/2017 13:16   X-ray Chest Pa And Lateral  Result Date: 03/21/2017 CLINICAL DATA:  Bronchitis Hx of DM, HTN, coronary artery disease, chronic heart failure, PNA EXAM: CHEST - 2 VIEW COMPARISON:  03/20/2017 FINDINGS: Relatively low lung volumes. Chronic blunting of the left lateral costophrenic angle. No new infiltrate. Mild central pulmonary vascular congestion. Heart size upper limits normal.  Previous CABG. No effusion. Previous median sternotomy. Flowing osteophytes in the mid and lower thoracic spine. IMPRESSION: No acute disease post CABG. Electronically Signed   By: Lucrezia Europe M.D.   On: 03/21/2017 09:17   Ct Angio Chest Pe W/cm &/or Wo Cm  Result Date: 03/14/2017 CLINICAL DATA:  Dyspnea short of breath. Suspect pulmonary embolism. Negative D-dimer EXAM: CT ANGIOGRAPHY CHEST WITH CONTRAST TECHNIQUE: Multidetector CT imaging of the chest was performed using the standard protocol during bolus  administration of intravenous contrast. Multiplanar CT image reconstructions and MIPs were obtained to evaluate the vascular anatomy. CONTRAST:  122mL ISOVUE-370 IOPAMIDOL (ISOVUE-370) INJECTION 76% COMPARISON:  03/14/2017, CT 07/22/2016 FINDINGS: Cardiovascular: Negative for pulmonary embolism. Pulmonary arteries normal in caliber. Mild atherosclerotic disease aortic arch without aneurysm. Heart size within normal limits. Prior CABG. Mild left ventricular enlargement. Mediastinum/Nodes: Negative for mass or adenopathy. Metal wire in the subcarinal region presumably related to prior median sternotomy. This is unchanged from the prior study. Lungs/Pleura: Negative for infiltrate or effusion. 8 mm sub solid nodule right lower lobe is unchanged. 8 mm solid nodule right middle lobe unchanged. Negative for pneumonia. Upper Abdomen: No acute abnormality  Musculoskeletal: No acute skeletal abnormality. Ankylosing spondylitis. Review of the MIP images confirms the above findings. IMPRESSION: Negative for pulmonary embolism.  No acute abnormality in the chest. Electronically Signed   By: Franchot Gallo M.D.   On: 03/14/2017 18:56   Dg Chest Portable 1 View  Result Date: 03/20/2017 CLINICAL DATA:  Shortness of breath, hypertension EXAM: PORTABLE CHEST 1 VIEW COMPARISON:  03/17/2017 FINDINGS: Prior CABG. Heart is normal size. Minimal bibasilar scarring or atelectasis. No visible effusions. No acute bony abnormality. IMPRESSION: Bibasilar scarring or atelectasis. Electronically Signed   By: Rolm Baptise M.D.   On: 03/20/2017 10:27   Dg Chest Port 1 View  Result Date: 03/17/2017 CLINICAL DATA:  Shortness of Breath EXAM: PORTABLE CHEST 1 VIEW COMPARISON:  03/14/2017 FINDINGS: Cardiac shadow remains mildly enlarged. Postsurgical changes are again seen and stable. The lungs are well aerated bilaterally. No focal infiltrate or sizable effusion is seen. Old rib fractures on the left are noted. IMPRESSION: No acute abnormality noted. Electronically Signed   By: Inez Catalina M.D.   On: 03/17/2017 08:51   Dg Chest Portable 1 View  Result Date: 03/14/2017 CLINICAL DATA:  Shortness of breath. EXAM: PORTABLE CHEST 1 VIEW COMPARISON:  PA and lateral chest 02/25/2016 and 01/01/2017. FINDINGS: The patient is status post CABG. Heart size is upper normal. Lungs are clear. No pneumothorax or pleural effusion. IMPRESSION: No acute disease. Electronically Signed   By: Inge Rise M.D.   On: 03/14/2017 15:34     Discharge Exam: Vitals:   03/21/17 0538 03/21/17 1300  BP: (!) 143/56 (!) 145/55  Pulse: 71 74  Resp: 20 20  Temp: 97.9 F (36.6 C) 97.7 F (36.5 C)  SpO2: 95% 97%   Vitals:   03/21/17 0042 03/21/17 0158 03/21/17 0538 03/21/17 1300  BP: (!) 146/35 (!) 168/54 (!) 143/56 (!) 145/55  Pulse: 65 60 71 74  Resp: 20 20 20 20   Temp:  97.9 F (36.6 C) 97.9  F (36.6 C) 97.7 F (36.5 C)  TempSrc:  Oral Oral Oral  SpO2:  95% 95% 97%    General: NAD  Cardiovascular: RRR, S1/S2 +, no rubs, no gallops Respiratory: Good air entry, bronchial sounds. Mild exp wheezing  Abdominal: Soft, NT, ND Extremities: no edema  The results of significant diagnostics from this hospitalization (including imaging, microbiology, ancillary and laboratory) are listed below for reference.     Microbiology: Recent Results (from the past 240 hour(s))  Culture, sputum-assessment     Status: None   Collection Time: 03/14/17 10:02 PM  Result Value Ref Range Status   Specimen Description EXPECTORATED SPUTUM  Final   Special Requests NONE  Final   Sputum evaluation   Final    Sputum specimen not acceptable for testing.  Please recollect.   Gram Stain Report Called  to,Read Back By and Verified With: T NILSON,RN AT 0721 03/15/17 BY L BENFIELD    Report Status 03/15/2017 FINAL  Final  MRSA PCR Screening     Status: None   Collection Time: 03/15/17  2:30 AM  Result Value Ref Range Status   MRSA by PCR NEGATIVE NEGATIVE Final    Comment:        The GeneXpert MRSA Assay (FDA approved for NASAL specimens only), is one component of a comprehensive MRSA colonization surveillance program. It is not intended to diagnose MRSA infection nor to guide or monitor treatment for MRSA infections.   Respiratory Panel by PCR     Status: Abnormal   Collection Time: 03/15/17  8:43 AM  Result Value Ref Range Status   Adenovirus NOT DETECTED NOT DETECTED Final   Coronavirus 229E NOT DETECTED NOT DETECTED Final   Coronavirus HKU1 NOT DETECTED NOT DETECTED Final   Coronavirus NL63 NOT DETECTED NOT DETECTED Final   Coronavirus OC43 NOT DETECTED NOT DETECTED Final   Metapneumovirus NOT DETECTED NOT DETECTED Final   Rhinovirus / Enterovirus NOT DETECTED NOT DETECTED Final   Influenza A NOT DETECTED NOT DETECTED Final   Influenza B NOT DETECTED NOT DETECTED Final   Parainfluenza  Virus 1 NOT DETECTED NOT DETECTED Final   Parainfluenza Virus 2 NOT DETECTED NOT DETECTED Final   Parainfluenza Virus 3 NOT DETECTED NOT DETECTED Final   Parainfluenza Virus 4 NOT DETECTED NOT DETECTED Final   Respiratory Syncytial Virus DETECTED (A) NOT DETECTED Final    Comment: CRITICAL RESULT CALLED TO, READ BACK BY AND VERIFIED WITH: N WELLINGTON,RN AT 1818 03/15/17 BY L BENFIELD    Bordetella pertussis NOT DETECTED NOT DETECTED Final   Chlamydophila pneumoniae NOT DETECTED NOT DETECTED Final   Mycoplasma pneumoniae NOT DETECTED NOT DETECTED Final  Blood culture (routine x 2)     Status: None (Preliminary result)   Collection Time: 03/20/17  9:40 AM  Result Value Ref Range Status   Specimen Description BLOOD RIGHT ANTECUBITAL  Final   Special Requests   Final    BOTTLES DRAWN AEROBIC AND ANAEROBIC Blood Culture adequate volume   Culture NO GROWTH 1 DAY  Final   Report Status PENDING  Incomplete  Blood culture (routine x 2)     Status: None (Preliminary result)   Collection Time: 03/20/17  9:50 AM  Result Value Ref Range Status   Specimen Description BLOOD LEFT ANTECUBITAL  Final   Special Requests   Final    BOTTLES DRAWN AEROBIC AND ANAEROBIC Blood Culture adequate volume   Culture NO GROWTH 1 DAY  Final   Report Status PENDING  Incomplete  Culture, respiratory (NON-Expectorated)     Status: None (Preliminary result)   Collection Time: 03/20/17  9:54 AM  Result Value Ref Range Status   Specimen Description SPUTUM  Final   Special Requests NONE  Final   Gram Stain   Final    FEW WBC PRESENT, PREDOMINANTLY PMN MODERATE GRAM POSITIVE COCCI FEW GRAM NEGATIVE RODS    Culture CULTURE REINCUBATED FOR BETTER GROWTH  Final   Report Status PENDING  Incomplete  Respiratory Panel by PCR     Status: Abnormal   Collection Time: 03/20/17  1:38 PM  Result Value Ref Range Status   Adenovirus NOT DETECTED NOT DETECTED Final   Coronavirus 229E NOT DETECTED NOT DETECTED Final    Coronavirus HKU1 NOT DETECTED NOT DETECTED Final   Coronavirus NL63 NOT DETECTED NOT DETECTED Final   Coronavirus OC43  NOT DETECTED NOT DETECTED Final   Metapneumovirus NOT DETECTED NOT DETECTED Final   Rhinovirus / Enterovirus NOT DETECTED NOT DETECTED Final   Influenza A NOT DETECTED NOT DETECTED Final   Influenza B NOT DETECTED NOT DETECTED Final   Parainfluenza Virus 1 NOT DETECTED NOT DETECTED Final   Parainfluenza Virus 2 NOT DETECTED NOT DETECTED Final   Parainfluenza Virus 3 NOT DETECTED NOT DETECTED Final   Parainfluenza Virus 4 NOT DETECTED NOT DETECTED Final   Respiratory Syncytial Virus DETECTED (A) NOT DETECTED Final    Comment: CRITICAL RESULT CALLED TO, READ BACK BY AND VERIFIED WITH: Joycelyn Man, RN AT 1843 ON 03/20/17 BY C. JESSUP, MLT.    Bordetella pertussis NOT DETECTED NOT DETECTED Final   Chlamydophila pneumoniae NOT DETECTED NOT DETECTED Final   Mycoplasma pneumoniae NOT DETECTED NOT DETECTED Final     Labs: BNP (last 3 results) Recent Labs    07/27/16 1350 03/14/17 1528 03/20/17 0915  BNP 173.6* 447.9* 161.0*   Basic Metabolic Panel: Recent Labs  Lab 03/14/17 1526 03/15/17 0635 03/20/17 0915 03/20/17 1525 03/21/17 0545  NA 137 136 138  --  139  K 4.3 4.1 4.4  --  5.2*  CL 106 103 102  --  108  CO2 21* 21* 18*  --  22  GLUCOSE 174* 203* 129*  --  154*  BUN 20 28* 68*  --  56*  CREATININE 1.47* 1.58* 2.02* 1.86* 1.59*  CALCIUM 8.9 9.2 9.3  --  8.7*   Liver Function Tests: Recent Labs  Lab 03/20/17 0915  AST 52*  ALT 63  ALKPHOS 59  BILITOT 1.2  PROT 6.3*  ALBUMIN 3.4*   No results for input(s): LIPASE, AMYLASE in the last 168 hours. No results for input(s): AMMONIA in the last 168 hours. CBC: Recent Labs  Lab 03/14/17 1526 03/20/17 0915 03/20/17 1525 03/21/17 0545  WBC 8.5 13.5* 13.0* 10.5  NEUTROABS  --  9.2*  --   --   HGB 12.8* 15.7 13.9 12.7*  HCT 39.7 48.4 44.5 40.2  MCV 94.1 94.9 95.9 95.0  PLT 168 223 199 216   Cardiac  Enzymes: Recent Labs  Lab 03/20/17 0915  TROPONINI 0.21*   BNP: Invalid input(s): POCBNP CBG: Recent Labs  Lab 03/18/17 1137 03/18/17 1634 03/18/17 2136 03/19/17 0727 03/19/17 1151  GLUCAP 269* 213* 164* 151* 229*   D-Dimer No results for input(s): DDIMER in the last 72 hours. Hgb A1c No results for input(s): HGBA1C in the last 72 hours. Lipid Profile No results for input(s): CHOL, HDL, LDLCALC, TRIG, CHOLHDL, LDLDIRECT in the last 72 hours. Thyroid function studies No results for input(s): TSH, T4TOTAL, T3FREE, THYROIDAB in the last 72 hours.  Invalid input(s): FREET3 Anemia work up No results for input(s): VITAMINB12, FOLATE, FERRITIN, TIBC, IRON, RETICCTPCT in the last 72 hours. Urinalysis    Component Value Date/Time   COLORURINE YELLOW 03/20/2017 1714   APPEARANCEUR CLEAR 03/20/2017 1714   LABSPEC 1.018 03/20/2017 1714   PHURINE 5.0 03/20/2017 1714   GLUCOSEU NEGATIVE 03/20/2017 1714   HGBUR NEGATIVE 03/20/2017 1714   BILIRUBINUR NEGATIVE 03/20/2017 1714   KETONESUR NEGATIVE 03/20/2017 1714   PROTEINUR NEGATIVE 03/20/2017 1714   NITRITE NEGATIVE 03/20/2017 1714   LEUKOCYTESUR NEGATIVE 03/20/2017 1714   Sepsis Labs Invalid input(s): PROCALCITONIN,  WBC,  LACTICIDVEN Microbiology Recent Results (from the past 240 hour(s))  Culture, sputum-assessment     Status: None   Collection Time: 03/14/17 10:02 PM  Result Value Ref Range  Status   Specimen Description EXPECTORATED SPUTUM  Final   Special Requests NONE  Final   Sputum evaluation   Final    Sputum specimen not acceptable for testing.  Please recollect.   Gram Stain Report Called to,Read Back By and Verified With: T NILSON,RN AT 0721 03/15/17 BY L BENFIELD    Report Status 03/15/2017 FINAL  Final  MRSA PCR Screening     Status: None   Collection Time: 03/15/17  2:30 AM  Result Value Ref Range Status   MRSA by PCR NEGATIVE NEGATIVE Final    Comment:        The GeneXpert MRSA Assay (FDA approved for  NASAL specimens only), is one component of a comprehensive MRSA colonization surveillance program. It is not intended to diagnose MRSA infection nor to guide or monitor treatment for MRSA infections.   Respiratory Panel by PCR     Status: Abnormal   Collection Time: 03/15/17  8:43 AM  Result Value Ref Range Status   Adenovirus NOT DETECTED NOT DETECTED Final   Coronavirus 229E NOT DETECTED NOT DETECTED Final   Coronavirus HKU1 NOT DETECTED NOT DETECTED Final   Coronavirus NL63 NOT DETECTED NOT DETECTED Final   Coronavirus OC43 NOT DETECTED NOT DETECTED Final   Metapneumovirus NOT DETECTED NOT DETECTED Final   Rhinovirus / Enterovirus NOT DETECTED NOT DETECTED Final   Influenza A NOT DETECTED NOT DETECTED Final   Influenza B NOT DETECTED NOT DETECTED Final   Parainfluenza Virus 1 NOT DETECTED NOT DETECTED Final   Parainfluenza Virus 2 NOT DETECTED NOT DETECTED Final   Parainfluenza Virus 3 NOT DETECTED NOT DETECTED Final   Parainfluenza Virus 4 NOT DETECTED NOT DETECTED Final   Respiratory Syncytial Virus DETECTED (A) NOT DETECTED Final    Comment: CRITICAL RESULT CALLED TO, READ BACK BY AND VERIFIED WITH: N WELLINGTON,RN AT 1818 03/15/17 BY L BENFIELD    Bordetella pertussis NOT DETECTED NOT DETECTED Final   Chlamydophila pneumoniae NOT DETECTED NOT DETECTED Final   Mycoplasma pneumoniae NOT DETECTED NOT DETECTED Final  Blood culture (routine x 2)     Status: None (Preliminary result)   Collection Time: 03/20/17  9:40 AM  Result Value Ref Range Status   Specimen Description BLOOD RIGHT ANTECUBITAL  Final   Special Requests   Final    BOTTLES DRAWN AEROBIC AND ANAEROBIC Blood Culture adequate volume   Culture NO GROWTH 1 DAY  Final   Report Status PENDING  Incomplete  Blood culture (routine x 2)     Status: None (Preliminary result)   Collection Time: 03/20/17  9:50 AM  Result Value Ref Range Status   Specimen Description BLOOD LEFT ANTECUBITAL  Final   Special Requests    Final    BOTTLES DRAWN AEROBIC AND ANAEROBIC Blood Culture adequate volume   Culture NO GROWTH 1 DAY  Final   Report Status PENDING  Incomplete  Culture, respiratory (NON-Expectorated)     Status: None (Preliminary result)   Collection Time: 03/20/17  9:54 AM  Result Value Ref Range Status   Specimen Description SPUTUM  Final   Special Requests NONE  Final   Gram Stain   Final    FEW WBC PRESENT, PREDOMINANTLY PMN MODERATE GRAM POSITIVE COCCI FEW GRAM NEGATIVE RODS    Culture CULTURE REINCUBATED FOR BETTER GROWTH  Final   Report Status PENDING  Incomplete  Respiratory Panel by PCR     Status: Abnormal   Collection Time: 03/20/17  1:38 PM  Result Value Ref  Range Status   Adenovirus NOT DETECTED NOT DETECTED Final   Coronavirus 229E NOT DETECTED NOT DETECTED Final   Coronavirus HKU1 NOT DETECTED NOT DETECTED Final   Coronavirus NL63 NOT DETECTED NOT DETECTED Final   Coronavirus OC43 NOT DETECTED NOT DETECTED Final   Metapneumovirus NOT DETECTED NOT DETECTED Final   Rhinovirus / Enterovirus NOT DETECTED NOT DETECTED Final   Influenza A NOT DETECTED NOT DETECTED Final   Influenza B NOT DETECTED NOT DETECTED Final   Parainfluenza Virus 1 NOT DETECTED NOT DETECTED Final   Parainfluenza Virus 2 NOT DETECTED NOT DETECTED Final   Parainfluenza Virus 3 NOT DETECTED NOT DETECTED Final   Parainfluenza Virus 4 NOT DETECTED NOT DETECTED Final   Respiratory Syncytial Virus DETECTED (A) NOT DETECTED Final    Comment: CRITICAL RESULT CALLED TO, READ BACK BY AND VERIFIED WITH: Joycelyn Man, RN AT 1843 ON 03/20/17 BY C. JESSUP, MLT.    Bordetella pertussis NOT DETECTED NOT DETECTED Final   Chlamydophila pneumoniae NOT DETECTED NOT DETECTED Final   Mycoplasma pneumoniae NOT DETECTED NOT DETECTED Final     Time coordinating discharge: 32 minutes  SIGNED:  Chipper Oman, MD  [facility] 03/21/2017, 2:45 PM  Pager please text page via  [email or web address]

## 2017-03-21 NOTE — Evaluation (Signed)
Physical Therapy Evaluation Patient Details Name: Lawrence Warner MRN: [id number] DOB: 02-12-1919 Today's Date: 03/21/2017   History of Present Illness  Pt is a 84 y/o male admitteed secondary to worsening SOB and weakness. Pt recently d/c on 1/23 secondary to URI. PMH includes CAD s/p CABG, DM, CHF, CKD 3, and HTN.   Clinical Impression  Pt presenting with problems above and deficits below. Pt requiring min A for basic transfer to chair this session. SOB noted, however, oxygen sats WFL. Recently d/c'd home and reports he was too weak to care for himself independently. Will need higher level of care at d/c prior to return home to ensure safety with mobility. Will continue to follow acutely to maximize functional mobility independence and safety.     Follow Up Recommendations SNF;Supervision/Assistance - 24 hour    Equipment Recommendations  None recommended by PT    Recommendations for Other Services       Precautions / Restrictions Precautions Precautions: Fall Restrictions Weight Bearing Restrictions: No      Mobility  Bed Mobility Overal bed mobility: Needs Assistance Bed Mobility: Supine to Sit     Supine to sit: Min guard     General bed mobility comments: Min guard for safety throughout transfer. Required increased time and use of bed rails.   Transfers Overall transfer level: Needs assistance Equipment used: None Transfers: Sit to/from [facility] Sit to Stand: Min assist Stand pivot transfers: Min assist       General transfer comment: Min A for lift assist and steadying assist to stand. Performed toileting task in standing with min guard for safety. Min A to transfer to recliner for steadying without AD. SOB noted, however, oxygen sats WFL.   Ambulation/Gait         Gait velocity: NT      Stairs            Wheelchair Mobility    Modified Rankin (Stroke Patients Only)       Balance Overall balance assessment: Needs  assistance Sitting-balance support: Feet supported;No upper extremity supported Sitting balance-Leahy Scale: Good     Standing balance support: No upper extremity supported;During functional activity Standing balance-Leahy Scale: Fair Standing balance comment: Can maintain static standing without UE support, however, requires external assist for dynamic mobility tasks.                              Pertinent Vitals/Pain Pain Assessment: No/denies pain    Home Living Family/patient expects to be discharged to:: Private residence Living Arrangements: Alone Available Help at Discharge: Available PRN/intermittently;Family Type of Home: Independent living facility Home Access: Level entry     Home Layout: One level Home Equipment: Shower seat - built in;Grab bars - tub/shower;Walker - 2 wheels;Electric scooter;Wheelchair - manual;Cane - single point      Prior Function Level of Independence: Independent with assistive device(s)         Comments: Cane for indoor ambulation and scooter for longer distance.      Hand Dominance   Dominant Hand: Right    Extremity/Trunk Assessment   Upper Extremity Assessment Upper Extremity Assessment: Generalized weakness    Lower Extremity Assessment Lower Extremity Assessment: Generalized weakness    Cervical / Trunk Assessment Cervical / Trunk Assessment: Kyphotic  Communication   Communication: No difficulties  Cognition Arousal/Alertness: Awake/alert Behavior During Therapy: WFL for tasks assessed/performed Overall Cognitive Status: Within Functional Limits for tasks assessed  General Comments General comments (skin integrity, edema, etc.): Pt reports he was too weak to care for himself at home. Educated about SNF and pt agreeable.     Exercises     Assessment/Plan    PT Assessment Patient needs continued PT services  PT Problem List Decreased  balance;Decreased mobility;Cardiopulmonary status limiting activity;Decreased knowledge of precautions;Decreased strength;Decreased knowledge of use of DME       PT Treatment Interventions DME instruction;Gait training;Stair training;Functional mobility training;Therapeutic activities;Therapeutic exercise;Balance training;Neuromuscular re-education;Patient/family education    PT Goals (Current goals can be found in the Care Plan section)  Acute Rehab PT Goals Patient Stated Goal: to get stronger  PT Goal Formulation: With patient Time For Goal Achievement: 04/04/17 Potential to Achieve Goals: Good    Frequency Min 2X/week   Barriers to discharge Decreased caregiver support Lives alone     Co-evaluation               AM-PAC PT "6 Clicks" Daily Activity  Outcome Measure Difficulty turning over in bed (including adjusting bedclothes, sheets and blankets)?: A Little Difficulty moving from lying on back to sitting on the side of the bed? : Unable Difficulty sitting down on and standing up from a chair with arms (e.g., wheelchair, bedside commode, etc,.)?: Unable Help needed moving to and from a bed to chair (including a wheelchair)?: A Little Help needed walking in hospital room?: A Little Help needed climbing 3-5 steps with a railing? : A Lot 6 Click Score: 13    End of Session Equipment Utilized During Treatment: Gait belt;Oxygen Activity Tolerance: Patient tolerated treatment well;Patient limited by fatigue Patient left: in chair;with call bell/phone within reach Nurse Communication: Mobility status PT Visit Diagnosis: Unsteadiness on feet (R26.81);Other abnormalities of gait and mobility (R26.89)    Time: 8937-3428 PT Time Calculation (min) (ACUTE ONLY): 23 min   Charges:   PT Evaluation $PT Eval Low Complexity: 1 Low PT Treatments $Therapeutic Activity: 8-22 mins   PT G Codes:        Leighton Ruff, PT, DPT  Acute Rehabilitation Services  Pager:  [phone]   Rudean Hitt 03/21/2017, 11:37 AM

## 2017-03-21 NOTE — Care Management Note (Signed)
Case Management Note  Patient Details  Name: Lawrence Warner MRN: [id number] Date of Birth: [birth year]  Subjective/Objective:    Pt in with cough. He is from [place].                 Action/Plan: Awaiting PT eval. Family interested in rehab at d/c. CM following for d/c disposition.   Expected Discharge Date:                  Expected Discharge Plan:  Skilled Nursing Facility  In-House Referral:  Clinical Social Work  Discharge planning Services     Post Acute Care Choice:    Choice offered to:     DME Arranged:    DME Agency:     HH Arranged:    [place] Agency:     Status of Service:  In process, will continue to follow  If discussed at Long Length of Stay Meetings, dates discussed:    Additional Comments:  Pollie Friar, RN 03/21/2017, 11:09 AM

## 2017-03-21 NOTE — Care Management Note (Signed)
Case Management Note  Patient Details  Name: Oriel Rumbold MRN: [id number] Date of Birth: 09/23/1918  Subjective/Objective:                    Action/Plan: Pt discharging to [facility] SNF. No further needs per CM.  Expected Discharge Date:  03/21/17               Expected Discharge Plan:  Skilled Nursing Facility  In-House Referral:  Clinical Social Work  Discharge planning Services     Post Acute Care Choice:    Choice offered to:     DME Arranged:    DME Agency:     HH Arranged:    [place] Agency:     Status of Service:  Completed, signed off  If discussed at [employer], dates discussed:    Additional Comments:  Pollie Friar, RN 03/21/2017, 3:03 PM

## 2017-03-21 NOTE — Progress Notes (Signed)
This RN called [facility] to report off; attempted x4 but unsuccessful with no answer from nurses at the facility. CSW notified and charge RN aware. Delia Heady RN

## 2017-03-22 LAB — CULTURE, RESPIRATORY

## 2017-03-22 LAB — CULTURE, RESPIRATORY W GRAM STAIN: Culture: NORMAL

## 2017-03-24 DIAGNOSIS — I10 Essential (primary) hypertension: Secondary | ICD-10-CM | POA: Diagnosis not present

## 2017-03-24 DIAGNOSIS — J205 Acute bronchitis due to respiratory syncytial virus: Secondary | ICD-10-CM | POA: Diagnosis not present

## 2017-03-25 ENCOUNTER — Ambulatory Visit: Payer: Medicare Other

## 2017-03-25 ENCOUNTER — Other Ambulatory Visit: Payer: Medicare Other

## 2017-03-25 DIAGNOSIS — I2510 Atherosclerotic heart disease of native coronary artery without angina pectoris: Secondary | ICD-10-CM | POA: Diagnosis not present

## 2017-03-25 DIAGNOSIS — I10 Essential (primary) hypertension: Secondary | ICD-10-CM | POA: Diagnosis not present

## 2017-03-25 DIAGNOSIS — J205 Acute bronchitis due to respiratory syncytial virus: Secondary | ICD-10-CM | POA: Diagnosis not present

## 2017-03-25 DIAGNOSIS — E119 Type 2 diabetes mellitus without complications: Secondary | ICD-10-CM | POA: Diagnosis not present

## 2017-03-25 LAB — CULTURE, BLOOD (ROUTINE X 2)
CULTURE: NO GROWTH
Culture: NO GROWTH
SPECIAL REQUESTS: ADEQUATE
Special Requests: ADEQUATE

## 2017-03-26 DIAGNOSIS — I509 Heart failure, unspecified: Secondary | ICD-10-CM | POA: Diagnosis not present

## 2017-03-26 DIAGNOSIS — R2681 Unsteadiness on feet: Secondary | ICD-10-CM | POA: Diagnosis not present

## 2017-03-26 DIAGNOSIS — E875 Hyperkalemia: Secondary | ICD-10-CM | POA: Diagnosis not present

## 2017-03-26 DIAGNOSIS — Z5189 Encounter for other specified aftercare: Secondary | ICD-10-CM | POA: Diagnosis not present

## 2017-03-26 DIAGNOSIS — J40 Bronchitis, not specified as acute or chronic: Secondary | ICD-10-CM | POA: Diagnosis not present

## 2017-03-26 DIAGNOSIS — M6281 Muscle weakness (generalized): Secondary | ICD-10-CM | POA: Diagnosis not present

## 2017-03-28 DIAGNOSIS — I2510 Atherosclerotic heart disease of native coronary artery without angina pectoris: Secondary | ICD-10-CM | POA: Diagnosis not present

## 2017-03-28 DIAGNOSIS — I10 Essential (primary) hypertension: Secondary | ICD-10-CM | POA: Diagnosis not present

## 2017-03-28 DIAGNOSIS — E119 Type 2 diabetes mellitus without complications: Secondary | ICD-10-CM | POA: Diagnosis not present

## 2017-03-28 DIAGNOSIS — R54 Age-related physical debility: Secondary | ICD-10-CM | POA: Diagnosis not present

## 2017-03-29 DIAGNOSIS — I2510 Atherosclerotic heart disease of native coronary artery without angina pectoris: Secondary | ICD-10-CM | POA: Diagnosis not present

## 2017-03-29 DIAGNOSIS — N183 Chronic kidney disease, stage 3 (moderate): Secondary | ICD-10-CM | POA: Diagnosis not present

## 2017-03-29 DIAGNOSIS — J205 Acute bronchitis due to respiratory syncytial virus: Secondary | ICD-10-CM | POA: Diagnosis not present

## 2017-03-29 DIAGNOSIS — M6281 Muscle weakness (generalized): Secondary | ICD-10-CM | POA: Diagnosis not present

## 2017-03-29 DIAGNOSIS — I129 Hypertensive chronic kidney disease with stage 1 through stage 4 chronic kidney disease, or unspecified chronic kidney disease: Secondary | ICD-10-CM | POA: Diagnosis not present

## 2017-03-29 DIAGNOSIS — E119 Type 2 diabetes mellitus without complications: Secondary | ICD-10-CM | POA: Diagnosis not present

## 2017-04-01 ENCOUNTER — Inpatient Hospital Stay: Payer: Medicare Other | Attending: Hematology

## 2017-04-01 ENCOUNTER — Inpatient Hospital Stay: Payer: Medicare Other

## 2017-04-01 ENCOUNTER — Inpatient Hospital Stay (HOSPITAL_BASED_OUTPATIENT_CLINIC_OR_DEPARTMENT_OTHER): Payer: Medicare Other | Admitting: Medical

## 2017-04-01 ENCOUNTER — Other Ambulatory Visit: Payer: Self-pay | Admitting: Medical

## 2017-04-01 VITALS — BP 138/52 | HR 67 | Temp 99.5°F | Resp 20

## 2017-04-01 DIAGNOSIS — R42 Dizziness and giddiness: Secondary | ICD-10-CM

## 2017-04-01 DIAGNOSIS — D693 Immune thrombocytopenic purpura: Secondary | ICD-10-CM

## 2017-04-01 DIAGNOSIS — I2510 Atherosclerotic heart disease of native coronary artery without angina pectoris: Secondary | ICD-10-CM | POA: Diagnosis not present

## 2017-04-01 DIAGNOSIS — N183 Chronic kidney disease, stage 3 (moderate): Secondary | ICD-10-CM | POA: Diagnosis not present

## 2017-04-01 DIAGNOSIS — I509 Heart failure, unspecified: Secondary | ICD-10-CM | POA: Diagnosis not present

## 2017-04-01 DIAGNOSIS — I130 Hypertensive heart and chronic kidney disease with heart failure and stage 1 through stage 4 chronic kidney disease, or unspecified chronic kidney disease: Secondary | ICD-10-CM | POA: Insufficient documentation

## 2017-04-01 DIAGNOSIS — J205 Acute bronchitis due to respiratory syncytial virus: Secondary | ICD-10-CM | POA: Diagnosis not present

## 2017-04-01 DIAGNOSIS — E119 Type 2 diabetes mellitus without complications: Secondary | ICD-10-CM | POA: Diagnosis not present

## 2017-04-01 DIAGNOSIS — Z23 Encounter for immunization: Secondary | ICD-10-CM

## 2017-04-01 DIAGNOSIS — Z95828 Presence of other vascular implants and grafts: Secondary | ICD-10-CM

## 2017-04-01 DIAGNOSIS — M6281 Muscle weakness (generalized): Secondary | ICD-10-CM | POA: Diagnosis not present

## 2017-04-01 DIAGNOSIS — I129 Hypertensive chronic kidney disease with stage 1 through stage 4 chronic kidney disease, or unspecified chronic kidney disease: Secondary | ICD-10-CM | POA: Diagnosis not present

## 2017-04-01 LAB — CBC WITH DIFFERENTIAL/PLATELET
Basophils Absolute: 0 10*3/uL (ref 0.0–0.1)
Basophils Relative: 0 %
EOS PCT: 1 %
Eosinophils Absolute: 0.1 10*3/uL (ref 0.0–0.5)
HCT: 36.1 % — ABNORMAL LOW (ref 38.4–49.9)
Hemoglobin: 11.8 g/dL — ABNORMAL LOW (ref 13.0–17.1)
LYMPHS ABS: 1.2 10*3/uL (ref 0.9–3.3)
LYMPHS PCT: 13 %
MCH: 30.3 pg (ref 27.2–33.4)
MCHC: 32.7 g/dL (ref 32.0–36.0)
MCV: 92.8 fL (ref 79.3–98.0)
Monocytes Absolute: 1 10*3/uL — ABNORMAL HIGH (ref 0.1–0.9)
Monocytes Relative: 11 %
Neutro Abs: 6.7 10*3/uL — ABNORMAL HIGH (ref 1.5–6.5)
Neutrophils Relative %: 75 %
PLATELETS: 113 10*3/uL — AB (ref 140–400)
RBC: 3.89 MIL/uL — AB (ref 4.20–5.82)
RDW: 13.4 % (ref 11.0–14.6)
WBC: 9 10*3/uL (ref 4.0–10.3)

## 2017-04-01 LAB — BASIC METABOLIC PANEL - CANCER CENTER ONLY
Anion gap: 8 (ref 3–11)
BUN: 23 mg/dL (ref 7–26)
CO2: 22 mmol/L (ref 22–29)
Calcium: 8.3 mg/dL — ABNORMAL LOW (ref 8.4–10.4)
Chloride: 109 mmol/L (ref 98–109)
Creatinine: 1.55 mg/dL — ABNORMAL HIGH (ref 0.70–1.30)
GFR, Est AFR Am: 42 mL/min — ABNORMAL LOW (ref >60)
GFR, Estimated: 36 mL/min — ABNORMAL LOW (ref >60)
Glucose, Bld: 132 mg/dL (ref 70–140)
POTASSIUM: 4.9 mmol/L (ref 3.5–5.1)
Sodium: 139 mmol/L (ref 136–145)

## 2017-04-01 MED ORDER — ROMIPLOSTIM 250 MCG ~~LOC~~ SOLR
50.0000 ug | SUBCUTANEOUS | Status: AC
  Administered 2017-04-01: 50 ug via SUBCUTANEOUS
  Filled 2017-04-01: qty 0.1

## 2017-04-01 NOTE — Patient Instructions (Signed)
Romiplostim injection What is this medicine? ROMIPLOSTIM (roe mi PLOE stim) helps your body make more platelets. This medicine is used to treat low platelets caused by chronic idiopathic thrombocytopenic purpura (ITP). This medicine may be used for other purposes; ask your health care provider or pharmacist if you have questions. COMMON BRAND NAME(S): Nplate What should I tell my health care provider before I take this medicine? They need to know if you have any of these conditions: -cancer or myelodysplastic syndrome -low blood counts, like low white cell, platelet, or red cell counts -take medicines that treat or prevent blood clots -an unusual or allergic reaction to romiplostim, mannitol, other medicines, foods, dyes, or preservatives -pregnant or trying to get pregnant -breast-feeding How should I use this medicine? This medicine is for injection under the skin. It is given by a health care professional in a hospital or clinic setting. A special MedGuide will be given to you before your injection. Read this information carefully each time. Talk to your pediatrician regarding the use of this medicine in children. Special care may be needed. Overdosage: If you think you have taken too much of this medicine contact a poison control center or emergency room at once. NOTE: This medicine is only for you. Do not share this medicine with others. What if I miss a dose? It is important not to miss your dose. Call your doctor or health care professional if you are unable to keep an appointment. What may interact with this medicine? Interactions are not expected. This list may not describe all possible interactions. Give your health care provider a list of all the medicines, herbs, non-prescription drugs, or dietary supplements you use. Also tell them if you smoke, drink alcohol, or use illegal drugs. Some items may interact with your medicine. What should I watch for while using this  medicine? Your condition will be monitored carefully while you are receiving this medicine. Visit your prescriber or health care professional for regular checks on your progress and for the needed blood tests. It is important to keep all appointments. What side effects may I notice from receiving this medicine? Side effects that you should report to your doctor or health care professional as soon as possible: -allergic reactions like skin rash, itching or hives, swelling of the face, lips, or tongue -shortness of breath, chest pain, swelling in a leg -unusual bleeding or bruising Side effects that usually do not require medical attention (report to your doctor or health care professional if they continue or are bothersome): -dizziness -headache -muscle aches -pain in arms and legs -stomach pain -trouble sleeping This list may not describe all possible side effects. Call your doctor for medical advice about side effects. You may report side effects to FDA at 1-800-FDA-1088. Where should I keep my medicine? This drug is given in a hospital or clinic and will not be stored at home. NOTE: This sheet is a summary. It may not cover all possible information. If you have questions about this medicine, talk to your doctor, pharmacist, or health care provider.  2018 Elsevier/Gold Standard (2007-10-12 15:13:04)  

## 2017-04-02 DIAGNOSIS — E119 Type 2 diabetes mellitus without complications: Secondary | ICD-10-CM | POA: Diagnosis not present

## 2017-04-02 DIAGNOSIS — I10 Essential (primary) hypertension: Secondary | ICD-10-CM | POA: Diagnosis not present

## 2017-04-02 DIAGNOSIS — R05 Cough: Secondary | ICD-10-CM | POA: Diagnosis not present

## 2017-04-02 DIAGNOSIS — I5022 Chronic systolic (congestive) heart failure: Secondary | ICD-10-CM | POA: Diagnosis not present

## 2017-04-02 DIAGNOSIS — M1990 Unspecified osteoarthritis, unspecified site: Secondary | ICD-10-CM | POA: Diagnosis not present

## 2017-04-03 DIAGNOSIS — M6281 Muscle weakness (generalized): Secondary | ICD-10-CM | POA: Diagnosis not present

## 2017-04-03 DIAGNOSIS — I129 Hypertensive chronic kidney disease with stage 1 through stage 4 chronic kidney disease, or unspecified chronic kidney disease: Secondary | ICD-10-CM | POA: Diagnosis not present

## 2017-04-03 DIAGNOSIS — E119 Type 2 diabetes mellitus without complications: Secondary | ICD-10-CM | POA: Diagnosis not present

## 2017-04-03 DIAGNOSIS — I2510 Atherosclerotic heart disease of native coronary artery without angina pectoris: Secondary | ICD-10-CM | POA: Diagnosis not present

## 2017-04-03 DIAGNOSIS — N183 Chronic kidney disease, stage 3 (moderate): Secondary | ICD-10-CM | POA: Diagnosis not present

## 2017-04-03 DIAGNOSIS — J205 Acute bronchitis due to respiratory syncytial virus: Secondary | ICD-10-CM | POA: Diagnosis not present

## 2017-04-03 NOTE — Progress Notes (Signed)
[facility] Progress Note   [name] [id number] 1918-08-14 84 y.o.  [name] is managed by Dr. Sullivan Lone  Actively treated with chemotherapy: no  Current Therapy: Nplate every 2 weeks  Last Treated: 03/11/2017  Assessment: Plan:    Orthostatic dizziness  Idiopathic thrombocytopenic purpura (HCC)   Orthostatic dizziness: [name] labs were reviewed.  A CBC from today returned with a hemoglobin of 11.8 and hematocrit of 36.1.  The patient's chemistry panel was reviewed.  His blood sugar was 132, his creatinine was slightly elevated at 1.55 and his calcium was slightly low at 8.3.  The patient had had a little to eat at breakfast.  He was taken into the exam room given food ginger ale.  His vital signs were monitored over an hour to an hour and a half.  He stated that he was feeling better and was released home.  ITP: The patient was dosed with Nplate as scheduled.  Please see After Visit Summary for patient specific instructions.  Future Appointments  Date Time Provider [place]  04/15/2017  9:15 AM [facility] LAB 4 [facility] None  04/15/2017  9:45 AM [facility] INJ NURSE [facility] None  04/29/2017  9:15 AM [facility] LAB 3 [facility] None  04/29/2017  9:45 AM [facility] INJ NURSE [facility] None  05/13/2017  8:30 AM [facility] LAB 6 [facility] None  05/13/2017  9:00 AM [name], MD [facility] None  05/13/2017 10:00 AM [facility] FLUSH NURSE [facility] None    No orders of the defined types were placed in this encounter.      Subjective:   Patient ID:  Lawrence Warner is a 84 y.o. (DOB [birth year]) male.  Chief Complaint: No chief complaint on file.   HPI Lawrence Warner is a 85 year old male with a history of ITP who is treated with Nplate given every 2 weeks.  He has presented to the emergency room 3 times since early November.  These have included postoperative pain, respiratory distress, and  acute kidney injury.  He presents to the office today for labs and for consideration of dosing with Nplate.  While at the lab, he reported that he was feeling weak.  He was brought to the [facility].  He resides in an assisted living facility.  His meals are provided.  He states that he had a little to eat this morning. He has a history of bradycardia per review of his chart.  He has a history of congestive heart failure for which he takes Lasix as needed.  He is seen by cardiology.  He was recently in a rehab through last Friday where he has been since his last hospital admission of 03/20/2017.  Patient was given something to eat and drink and was monitored for around 1 and 1/2 hours.  He reports that he was feeling substantially better after having food.  Medications: I have reviewed the patient's current medications.  Allergies:  Allergies  Allergen Reactions  . Alfuzosin Hcl Er Other (See Comments)    Confusion/Uroxatral   . Imdur [Isosorbide Dinitrate]     REACTION NOT RECALLED  . Penicillins Hives and Other (See Comments)    Has patient had a PCN reaction causing immediate rash, facial/tongue/throat swelling, SOB or lightheadedness with hypotension: Yes Has patient had a PCN reaction causing severe rash involving mucus membranes or skin necrosis: Unknown Has patient had a PCN reaction that required hospitalization: Unknown Has patient had a PCN reaction occurring within the last 10 years: Unknown If  all of the above answers are "NO", then may proceed with Cephalosporin use.   . Tape     SKIN IS VERY THIN AND WILL TEAR EASILY    Past Medical History:  Diagnosis Date  . BPH (benign prostatic hypertrophy)   . Chronic heart failure ([place])   . Chronic renal insufficiency   . Coronary artery disease    CABG in 2000  . Diabetes type 2, controlled ([place])    Diet-controlled  . Diabetic neuropathy ([place])   . Dyslipidemia   . Hypertension   . ITP (idiopathic thrombocytopenic  purpura)   . Osteoarthritis   . Pneumonia     Past Surgical History:  Procedure Laterality Date  . CHOLECYSTECTOMY    . CORONARY ARTERY BYPASS GRAFT  2000  . Right hip replacement  2014  . TONSILLECTOMY      Family History  Problem Relation Age of Onset  . Lung disease Father   . Cancer Brother     Social History   Socioeconomic History  . Marital status: Single    Spouse name: Not on file  . Number of children: Not on file  . Years of education: Not on file  . Highest education level: Not on file  Social Needs  . Financial resource strain: Not on file  . Food insecurity - worry: Not on file  . Food insecurity - inability: Not on file  . Transportation needs - medical: Not on file  . Transportation needs - non-medical: Not on file  Occupational History  . Occupation: Salesman  Tobacco Use  . Smoking status: Former Smoker    Packs/day: 2.00    Years: 25.00    Pack years: 50.00  . Smokeless tobacco: Never Used  Substance and Sexual Activity  . Alcohol use: No  . Drug use: No  . Sexual activity: Not Currently  Other Topics Concern  . Not on file  Social History Narrative    Two children.      Past Medical History, Surgical history, Social history, and Family history were reviewed and updated as appropriate.   Please see review of systems for further details on the patient's review from today.   Review of Systems:  Review of Systems  HENT: Negative for trouble swallowing.   Respiratory: Negative for cough, chest tightness and shortness of breath.   Cardiovascular: Positive for leg swelling. Negative for chest pain and palpitations.  Neurological: Positive for weakness. Negative for dizziness, facial asymmetry, speech difficulty and light-headedness.    Objective:   Physical Exam:  BP (!) 138/52 (BP Location: Left Arm, Patient Position: Sitting)   Pulse 67   Temp 99.5 F (37.5 C) (Oral) Comment: Pt just ate hot soup.  Resp 20   SpO2 100% Comment:  RA ECOG: 1  Physical Exam  Constitutional: No distress.  HENT:  Head: Normocephalic and atraumatic.  Cardiovascular: S1 normal and S2 normal. Bradycardia present.  Pulmonary/Chest: Effort normal and breath sounds normal. No respiratory distress. He has no wheezes. He has no rales.  Musculoskeletal: He exhibits edema (1+ pitting edema to the knees bilaterally.).  Neurological: He is alert. Coordination (The patient is ambulation with a wheelchair.) abnormal.  Skin: He is not diaphoretic.    Lab Review:     Component Value Date/Time   NA 139 04/01/2017 1112   NA 141 12/03/2016 0910   K 4.9 04/01/2017 1112   K 4.5 12/03/2016 0910   CL 109 04/01/2017 1112   CO2 22 04/01/2017 1112  CO2 24 12/03/2016 0910   GLUCOSE 132 04/01/2017 1112   GLUCOSE 169 (H) 12/03/2016 0910   BUN 23 04/01/2017 1112   BUN 31.8 (H) 12/03/2016 0910   CREATININE 1.55 (H) 04/01/2017 1112   CREATININE 1.7 (H) 12/03/2016 0910   CALCIUM 8.3 (L) 04/01/2017 1112   CALCIUM 9.6 12/03/2016 0910   PROT 6.3 (L) 03/20/2017 0915   PROT 6.5 12/03/2016 0910   ALBUMIN 3.4 (L) 03/20/2017 0915   ALBUMIN 3.3 (L) 12/03/2016 0910   AST 52 (H) 03/20/2017 0915   AST 20 12/03/2016 0910   ALT 63 03/20/2017 0915   ALT 16 12/03/2016 0910   ALKPHOS 59 03/20/2017 0915   ALKPHOS 76 12/03/2016 0910   BILITOT 1.2 03/20/2017 0915   BILITOT 0.36 12/03/2016 0910   GFRNONAA 36 (L) 04/01/2017 1112   GFRAA 42 (L) 04/01/2017 1112       Component Value Date/Time   WBC 9.0 04/01/2017 0918   RBC 3.89 (L) 04/01/2017 0918   HGB 11.8 (L) 04/01/2017 0918   HGB 13.4 02/26/2017 0927   HCT 36.1 (L) 04/01/2017 0918   HCT 40.9 02/26/2017 0927   PLT 113 (L) 04/01/2017 0918   PLT 195 03/11/2017 0918   PLT 176 02/26/2017 0927   MCV 92.8 04/01/2017 0918   MCV 92.6 02/26/2017 0927   MCH 30.3 04/01/2017 0918   MCHC 32.7 04/01/2017 0918   RDW 13.4 04/01/2017 0918   RDW 13.5 02/26/2017 0927   LYMPHSABS 1.2 04/01/2017 0918   LYMPHSABS 1.1  02/26/2017 0927   MONOABS 1.0 (H) 04/01/2017 0918   MONOABS 0.7 02/26/2017 0927   EOSABS 0.1 04/01/2017 0918   EOSABS 0.1 02/26/2017 0927   BASOSABS 0.0 04/01/2017 0918   BASOSABS 0.0 02/26/2017 0927   -------------------------------  Imaging from last 24 hours (if applicable):  Radiology interpretation: Ct Abdomen Pelvis Wo Contrast  Result Date: 03/20/2017 CLINICAL DATA:  Abdominal distension.  Concern for diverticulitis. EXAM: CT ABDOMEN AND PELVIS WITHOUT CONTRAST TECHNIQUE: Multidetector CT imaging of the abdomen and pelvis was performed following the standard protocol without IV contrast. COMPARISON:  Abdominal ultrasound 10/06/2014 FINDINGS: Lower chest: Mild scarring in the lung bases. Punctate calcified granuloma in the right lower lobe. No pleural effusion. Coronary artery atherosclerosis. Hepatobiliary: Punctate calcified granuloma in the right hepatic lobe. No significant biliary dilatation status post cholecystectomy. Pancreas: Mild diffuse atrophy. No ductal dilatation or inflammatory change. Spleen: Unremarkable. Adrenals/Urinary Tract: Unremarkable adrenal glands. Lobular renal contour with cortical thinning bilaterally. 10 mm posterior left lower pole renal cyst. No renal calculi or hydronephrosis. Grossly unremarkable bladder within limitations of streak artifact. Stomach/Bowel: The stomach is within normal limits. A moderate-sized diverticulum is noted arising from the distal second portion of the duodenum without surrounding inflammation. There is no evidence of bowel obstruction. There is extensive sigmoid colon diverticulosis without evidence of diverticulitis. The appendix is unremarkable. Vascular/Lymphatic: Abdominal aortic atherosclerosis without aneurysm. Atherosclerotic and minimally ectatic common iliac arteries. No enlarged lymph nodes. Reproductive: Unremarkable prostate. Other: No intraperitoneal free fluid. Minimal nonspecific haziness in the central mesentery without  mass or fluid collection. Tiny fat containing umbilical hernia. Musculoskeletal: Right hip arthroplasty with associated streak artifact. Diffuse flowing anterior vertebral ossification and posterior element ankylosis in the lower thoracic and lumbar spine extending to the sacrum. Partial ankylosis of both SI joints. IMPRESSION: 1. No acute abnormality identified in the abdomen or pelvis. 2. Extensive sigmoid colon diverticulosis without diverticulitis. 3.  Aortic Atherosclerosis (ICD10-I70.0). Electronically Signed   By: Seymour Bars.D.  On: 03/20/2017 13:16   X-ray Chest Pa And Lateral  Result Date: 03/21/2017 CLINICAL DATA:  Bronchitis Hx of DM, HTN, coronary artery disease, chronic heart failure, PNA EXAM: CHEST - 2 VIEW COMPARISON:  03/20/2017 FINDINGS: Relatively low lung volumes. Chronic blunting of the left lateral costophrenic angle. No new infiltrate. Mild central pulmonary vascular congestion. Heart size upper limits normal.  Previous CABG. No effusion. Previous median sternotomy. Flowing osteophytes in the mid and lower thoracic spine. IMPRESSION: No acute disease post CABG. Electronically Signed   By: Lucrezia Europe M.D.   On: 03/21/2017 09:17   Ct Angio Chest Pe W/cm &/or Wo Cm  Result Date: 03/14/2017 CLINICAL DATA:  Dyspnea short of breath. Suspect pulmonary embolism. Negative D-dimer EXAM: CT ANGIOGRAPHY CHEST WITH CONTRAST TECHNIQUE: Multidetector CT imaging of the chest was performed using the standard protocol during bolus administration of intravenous contrast. Multiplanar CT image reconstructions and MIPs were obtained to evaluate the vascular anatomy. CONTRAST:  120mL ISOVUE-370 IOPAMIDOL (ISOVUE-370) INJECTION 76% COMPARISON:  03/14/2017, CT 07/22/2016 FINDINGS: Cardiovascular: Negative for pulmonary embolism. Pulmonary arteries normal in caliber. Mild atherosclerotic disease aortic arch without aneurysm. Heart size within normal limits. Prior CABG. Mild left ventricular enlargement.  Mediastinum/Nodes: Negative for mass or adenopathy. Metal wire in the subcarinal region presumably related to prior median sternotomy. This is unchanged from the prior study. Lungs/Pleura: Negative for infiltrate or effusion. 8 mm sub solid nodule right lower lobe is unchanged. 8 mm solid nodule right middle lobe unchanged. Negative for pneumonia. Upper Abdomen: No acute abnormality Musculoskeletal: No acute skeletal abnormality. Ankylosing spondylitis. Review of the MIP images confirms the above findings. IMPRESSION: Negative for pulmonary embolism.  No acute abnormality in the chest. Electronically Signed   By: Franchot Gallo M.D.   On: 03/14/2017 18:56   Dg Chest Portable 1 View  Result Date: 03/20/2017 CLINICAL DATA:  Shortness of breath, hypertension EXAM: PORTABLE CHEST 1 VIEW COMPARISON:  03/17/2017 FINDINGS: Prior CABG. Heart is normal size. Minimal bibasilar scarring or atelectasis. No visible effusions. No acute bony abnormality. IMPRESSION: Bibasilar scarring or atelectasis. Electronically Signed   By: Rolm Baptise M.D.   On: 03/20/2017 10:27   Dg Chest Port 1 View  Result Date: 03/17/2017 CLINICAL DATA:  Shortness of Breath EXAM: PORTABLE CHEST 1 VIEW COMPARISON:  03/14/2017 FINDINGS: Cardiac shadow remains mildly enlarged. Postsurgical changes are again seen and stable. The lungs are well aerated bilaterally. No focal infiltrate or sizable effusion is seen. Old rib fractures on the left are noted. IMPRESSION: No acute abnormality noted. Electronically Signed   By: Inez Catalina M.D.   On: 03/17/2017 08:51   Dg Chest Portable 1 View  Result Date: 03/14/2017 CLINICAL DATA:  Shortness of breath. EXAM: PORTABLE CHEST 1 VIEW COMPARISON:  PA and lateral chest 02/25/2016 and 01/01/2017. FINDINGS: The patient is status post CABG. Heart size is upper normal. Lungs are clear. No pneumothorax or pleural effusion. IMPRESSION: No acute disease. Electronically Signed   By: Inge Rise M.D.   On:  03/14/2017 15:34

## 2017-04-07 DIAGNOSIS — I2510 Atherosclerotic heart disease of native coronary artery without angina pectoris: Secondary | ICD-10-CM | POA: Diagnosis not present

## 2017-04-07 DIAGNOSIS — N183 Chronic kidney disease, stage 3 (moderate): Secondary | ICD-10-CM | POA: Diagnosis not present

## 2017-04-07 DIAGNOSIS — I129 Hypertensive chronic kidney disease with stage 1 through stage 4 chronic kidney disease, or unspecified chronic kidney disease: Secondary | ICD-10-CM | POA: Diagnosis not present

## 2017-04-07 DIAGNOSIS — E119 Type 2 diabetes mellitus without complications: Secondary | ICD-10-CM | POA: Diagnosis not present

## 2017-04-07 DIAGNOSIS — J205 Acute bronchitis due to respiratory syncytial virus: Secondary | ICD-10-CM | POA: Diagnosis not present

## 2017-04-07 DIAGNOSIS — M6281 Muscle weakness (generalized): Secondary | ICD-10-CM | POA: Diagnosis not present

## 2017-04-08 ENCOUNTER — Other Ambulatory Visit: Payer: Medicare Other

## 2017-04-08 ENCOUNTER — Ambulatory Visit: Payer: Medicare Other

## 2017-04-10 DIAGNOSIS — I129 Hypertensive chronic kidney disease with stage 1 through stage 4 chronic kidney disease, or unspecified chronic kidney disease: Secondary | ICD-10-CM | POA: Diagnosis not present

## 2017-04-10 DIAGNOSIS — I2510 Atherosclerotic heart disease of native coronary artery without angina pectoris: Secondary | ICD-10-CM | POA: Diagnosis not present

## 2017-04-10 DIAGNOSIS — M6281 Muscle weakness (generalized): Secondary | ICD-10-CM | POA: Diagnosis not present

## 2017-04-10 DIAGNOSIS — N183 Chronic kidney disease, stage 3 (moderate): Secondary | ICD-10-CM | POA: Diagnosis not present

## 2017-04-10 DIAGNOSIS — J205 Acute bronchitis due to respiratory syncytial virus: Secondary | ICD-10-CM | POA: Diagnosis not present

## 2017-04-10 DIAGNOSIS — E119 Type 2 diabetes mellitus without complications: Secondary | ICD-10-CM | POA: Diagnosis not present

## 2017-04-14 DIAGNOSIS — N183 Chronic kidney disease, stage 3 (moderate): Secondary | ICD-10-CM | POA: Diagnosis not present

## 2017-04-14 DIAGNOSIS — J205 Acute bronchitis due to respiratory syncytial virus: Secondary | ICD-10-CM | POA: Diagnosis not present

## 2017-04-14 DIAGNOSIS — M6281 Muscle weakness (generalized): Secondary | ICD-10-CM | POA: Diagnosis not present

## 2017-04-14 DIAGNOSIS — I129 Hypertensive chronic kidney disease with stage 1 through stage 4 chronic kidney disease, or unspecified chronic kidney disease: Secondary | ICD-10-CM | POA: Diagnosis not present

## 2017-04-14 DIAGNOSIS — E119 Type 2 diabetes mellitus without complications: Secondary | ICD-10-CM | POA: Diagnosis not present

## 2017-04-14 DIAGNOSIS — I2510 Atherosclerotic heart disease of native coronary artery without angina pectoris: Secondary | ICD-10-CM | POA: Diagnosis not present

## 2017-04-15 ENCOUNTER — Inpatient Hospital Stay: Payer: Medicare Other

## 2017-04-15 ENCOUNTER — Other Ambulatory Visit: Payer: Self-pay | Admitting: *Deleted

## 2017-04-15 VITALS — BP 157/79 | HR 75 | Temp 97.8°F | Resp 20

## 2017-04-15 DIAGNOSIS — I509 Heart failure, unspecified: Secondary | ICD-10-CM | POA: Diagnosis not present

## 2017-04-15 DIAGNOSIS — D693 Immune thrombocytopenic purpura: Secondary | ICD-10-CM

## 2017-04-15 DIAGNOSIS — E860 Dehydration: Secondary | ICD-10-CM

## 2017-04-15 DIAGNOSIS — I130 Hypertensive heart and chronic kidney disease with heart failure and stage 1 through stage 4 chronic kidney disease, or unspecified chronic kidney disease: Secondary | ICD-10-CM | POA: Diagnosis not present

## 2017-04-15 DIAGNOSIS — R42 Dizziness and giddiness: Secondary | ICD-10-CM | POA: Diagnosis not present

## 2017-04-15 DIAGNOSIS — Z95828 Presence of other vascular implants and grafts: Secondary | ICD-10-CM

## 2017-04-15 LAB — CMP (CANCER CENTER ONLY)
ALK PHOS: 70 U/L (ref 40–150)
ALT: 9 U/L (ref 0–55)
ANION GAP: 9 (ref 3–11)
AST: 18 U/L (ref 5–34)
Albumin: 2.9 g/dL — ABNORMAL LOW (ref 3.5–5.0)
BILIRUBIN TOTAL: 0.3 mg/dL (ref 0.2–1.2)
BUN: 13 mg/dL (ref 7–26)
CALCIUM: 9.2 mg/dL (ref 8.4–10.4)
CO2: 24 mmol/L (ref 22–29)
Chloride: 109 mmol/L (ref 98–109)
Creatinine: 1.51 mg/dL — ABNORMAL HIGH (ref 0.70–1.30)
GFR, EST AFRICAN AMERICAN: 43 mL/min — AB (ref >60)
GFR, EST NON AFRICAN AMERICAN: 37 mL/min — AB (ref >60)
Glucose, Bld: 118 mg/dL (ref 70–140)
Potassium: 4.5 mmol/L (ref 3.5–5.1)
Sodium: 142 mmol/L (ref 136–145)
TOTAL PROTEIN: 6.2 g/dL — AB (ref 6.4–8.3)

## 2017-04-15 LAB — CBC WITH DIFFERENTIAL (CANCER CENTER ONLY)
BASOS ABS: 0 10*3/uL (ref 0.0–0.1)
BASOS PCT: 0 %
EOS ABS: 0.1 10*3/uL (ref 0.0–0.5)
Eosinophils Relative: 2 %
HCT: 37.8 % — ABNORMAL LOW (ref 38.4–49.9)
HEMOGLOBIN: 11.9 g/dL — AB (ref 13.0–17.1)
Lymphocytes Relative: 22 %
Lymphs Abs: 1.2 10*3/uL (ref 0.9–3.3)
MCH: 29.8 pg (ref 27.2–33.4)
MCHC: 31.5 g/dL — AB (ref 32.0–36.0)
MCV: 94.7 fL (ref 79.3–98.0)
Monocytes Absolute: 0.6 10*3/uL (ref 0.1–0.9)
Monocytes Relative: 11 %
NEUTROS PCT: 65 %
Neutro Abs: 3.3 10*3/uL (ref 1.5–6.5)
Platelet Count: 297 10*3/uL (ref 140–400)
RBC: 3.99 MIL/uL — ABNORMAL LOW (ref 4.20–5.82)
RDW: 13.8 % (ref 11.0–14.6)
WBC Count: 5.2 10*3/uL (ref 4.0–10.3)

## 2017-04-15 LAB — RETICULOCYTES
RBC.: 3.99 MIL/uL — AB (ref 4.20–5.82)
Retic Count, Absolute: 107.7 10*3/uL — ABNORMAL HIGH (ref 34.8–93.9)
Retic Ct Pct: 2.7 % — ABNORMAL HIGH (ref 0.8–1.8)

## 2017-04-15 MED ORDER — CYANOCOBALAMIN 1000 MCG/ML IJ SOLN
1000.0000 ug | INTRAMUSCULAR | Status: DC
  Administered 2017-04-15: 1000 ug via SUBCUTANEOUS

## 2017-04-15 MED ORDER — ROMIPLOSTIM 250 MCG ~~LOC~~ SOLR
50.0000 ug | SUBCUTANEOUS | Status: DC
  Administered 2017-04-15: 50 ug via SUBCUTANEOUS
  Filled 2017-04-15: qty 0.1

## 2017-04-15 NOTE — Patient Instructions (Signed)
Romiplostim injection What is this medicine? ROMIPLOSTIM ([place] mi PLOE stim) helps your body make more platelets. This medicine is used to treat low platelets caused by chronic idiopathic thrombocytopenic purpura (ITP). This medicine may be used for other purposes; ask your health care provider or pharmacist if you have questions. What should I tell my health care provider before I take this medicine? They need to know if you have any of these conditions: -cancer or myelodysplastic syndrome -low blood counts, like low white cell, platelet, or red cell counts -take medicines that treat or prevent blood clots -an unusual or allergic reaction to romiplostim, mannitol, other medicines, foods, dyes, or preservatives -pregnant or trying to get pregnant -breast-feeding How should I use this medicine? This medicine is for injection under the skin. It is given by a health care professional in a hospital or clinic setting. A special MedGuide will be given to you before your injection. Read this information carefully each time. Talk to your pediatrician regarding the use of this medicine in children. Special care may be needed. Overdosage: If you think you have taken too much of this medicine contact a poison control center or emergency room at once. NOTE: This medicine is only for you. Do not share this medicine with others. What if I miss a dose? It is important not to miss your dose. Call your doctor or health care professional if you are unable to keep an appointment. What may interact with this medicine? Interactions are not expected. This list may not describe all possible interactions. Give your health care provider a list of all the medicines, herbs, non-prescription drugs, or dietary supplements you use. Also tell them if you smoke, drink alcohol, or use illegal drugs. Some items may interact with your medicine. What should I watch for while using this medicine? Your condition will be monitored  carefully while you are receiving this medicine. Visit your prescriber or health care professional for regular checks on your progress and for the needed blood tests. It is important to keep all appointments. What side effects may I notice from receiving this medicine? Side effects that you should report to your doctor or health care professional as soon as possible: -allergic reactions like skin rash, itching or hives, swelling of the face, lips, or tongue -shortness of breath, chest pain, swelling in a leg -unusual bleeding or bruising Side effects that usually do not require medical attention (report to your doctor or health care professional if they continue or are bothersome): -dizziness -headache -muscle aches -pain in arms and legs -stomach pain -trouble sleeping This list may not describe all possible side effects. Call your doctor for medical advice about side effects. You may report side effects to FDA at 1-800-FDA-1088. Where should I keep my medicine? This drug is given in a hospital or clinic and will not be stored at home. NOTE: This sheet is a summary. It may not cover all possible information. If you have questions about this medicine, talk to your doctor, pharmacist, or health care provider.    2016, Elsevier/Gold Standard. (2007-10-12 15:13:04) Cyanocobalamin, Vitamin B12 injection What is this medicine? CYANOCOBALAMIN (sye an oh koe BAL a min) is a man made form of vitamin B12. Vitamin B12 is used in the growth of healthy blood cells, nerve cells, and proteins in the body. It also helps with the metabolism of fats and carbohydrates. This medicine is used to treat people who can not absorb vitamin B12. This medicine may be used for other   purposes; ask your health care provider or pharmacist if you have questions. What should I tell my health care provider before I take this medicine? They need to know if you have any of these conditions: -kidney disease -Leber's  disease -megaloblastic anemia -an unusual or allergic reaction to cyanocobalamin, cobalt, other medicines, foods, dyes, or preservatives -pregnant or trying to get pregnant -breast-feeding How should I use this medicine? This medicine is injected into a muscle or deeply under the skin. It is usually given by a health care professional in a clinic or doctor's office. However, your doctor may teach you how to inject yourself. Follow all instructions. Talk to your pediatrician regarding the use of this medicine in children. Special care may be needed. Overdosage: If you think you have taken too much of this medicine contact a poison control center or emergency room at once. NOTE: This medicine is only for you. Do not share this medicine with others. What if I miss a dose? If you are given your dose at a clinic or doctor's office, call to reschedule your appointment. If you give your own injections and you miss a dose, take it as soon as you can. If it is almost time for your next dose, take only that dose. Do not take double or extra doses. What may interact with this medicine? -colchicine -heavy alcohol intake This list may not describe all possible interactions. Give your health care provider a list of all the medicines, herbs, non-prescription drugs, or dietary supplements you use. Also tell them if you smoke, drink alcohol, or use illegal drugs. Some items may interact with your medicine. What should I watch for while using this medicine? Visit your doctor or health care professional regularly. You may need blood work done while you are taking this medicine. You may need to follow a special diet. Talk to your doctor. Limit your alcohol intake and avoid smoking to get the best benefit. What side effects may I notice from receiving this medicine? Side effects that you should report to your doctor or health care professional as soon as possible: -allergic reactions like skin rash, itching or  hives, swelling of the face, lips, or tongue -blue tint to skin -chest tightness, pain -difficulty breathing, wheezing -dizziness -red, swollen painful area on the leg Side effects that usually do not require medical attention (report to your doctor or health care professional if they continue or are bothersome): -diarrhea -headache This list may not describe all possible side effects. Call your doctor for medical advice about side effects. You may report side effects to FDA at 1-800-FDA-1088. Where should I keep my medicine? Keep out of the reach of children. Store at room temperature between 15 and 30 degrees C (59 and 85 degrees F). Protect from light. Throw away any unused medicine after the expiration date. NOTE: This sheet is a summary. It may not cover all possible information. If you have questions about this medicine, talk to your doctor, pharmacist, or health care provider.    2016, Elsevier/Gold Standard. (2007-05-25 22:10:20)  

## 2017-04-17 DIAGNOSIS — J342 Deviated nasal septum: Secondary | ICD-10-CM | POA: Diagnosis not present

## 2017-04-17 DIAGNOSIS — J410 Simple chronic bronchitis: Secondary | ICD-10-CM | POA: Diagnosis not present

## 2017-04-17 DIAGNOSIS — M6281 Muscle weakness (generalized): Secondary | ICD-10-CM | POA: Diagnosis not present

## 2017-04-17 DIAGNOSIS — J3501 Chronic tonsillitis: Secondary | ICD-10-CM | POA: Diagnosis not present

## 2017-04-17 DIAGNOSIS — I2510 Atherosclerotic heart disease of native coronary artery without angina pectoris: Secondary | ICD-10-CM | POA: Diagnosis not present

## 2017-04-17 DIAGNOSIS — J320 Chronic maxillary sinusitis: Secondary | ICD-10-CM | POA: Diagnosis not present

## 2017-04-17 DIAGNOSIS — I129 Hypertensive chronic kidney disease with stage 1 through stage 4 chronic kidney disease, or unspecified chronic kidney disease: Secondary | ICD-10-CM | POA: Diagnosis not present

## 2017-04-17 DIAGNOSIS — J205 Acute bronchitis due to respiratory syncytial virus: Secondary | ICD-10-CM | POA: Diagnosis not present

## 2017-04-17 DIAGNOSIS — N183 Chronic kidney disease, stage 3 (moderate): Secondary | ICD-10-CM | POA: Diagnosis not present

## 2017-04-17 DIAGNOSIS — J322 Chronic ethmoidal sinusitis: Secondary | ICD-10-CM | POA: Diagnosis not present

## 2017-04-17 DIAGNOSIS — E119 Type 2 diabetes mellitus without complications: Secondary | ICD-10-CM | POA: Diagnosis not present

## 2017-04-17 DIAGNOSIS — J370 Chronic laryngitis: Secondary | ICD-10-CM | POA: Diagnosis not present

## 2017-04-21 DIAGNOSIS — E119 Type 2 diabetes mellitus without complications: Secondary | ICD-10-CM | POA: Diagnosis not present

## 2017-04-21 DIAGNOSIS — I129 Hypertensive chronic kidney disease with stage 1 through stage 4 chronic kidney disease, or unspecified chronic kidney disease: Secondary | ICD-10-CM | POA: Diagnosis not present

## 2017-04-21 DIAGNOSIS — M6281 Muscle weakness (generalized): Secondary | ICD-10-CM | POA: Diagnosis not present

## 2017-04-21 DIAGNOSIS — J205 Acute bronchitis due to respiratory syncytial virus: Secondary | ICD-10-CM | POA: Diagnosis not present

## 2017-04-21 DIAGNOSIS — I2510 Atherosclerotic heart disease of native coronary artery without angina pectoris: Secondary | ICD-10-CM | POA: Diagnosis not present

## 2017-04-21 DIAGNOSIS — N183 Chronic kidney disease, stage 3 (moderate): Secondary | ICD-10-CM | POA: Diagnosis not present

## 2017-04-22 ENCOUNTER — Ambulatory Visit: Payer: Medicare Other

## 2017-04-22 ENCOUNTER — Other Ambulatory Visit: Payer: Medicare Other

## 2017-04-22 DIAGNOSIS — I129 Hypertensive chronic kidney disease with stage 1 through stage 4 chronic kidney disease, or unspecified chronic kidney disease: Secondary | ICD-10-CM | POA: Diagnosis not present

## 2017-04-22 DIAGNOSIS — N183 Chronic kidney disease, stage 3 (moderate): Secondary | ICD-10-CM | POA: Diagnosis not present

## 2017-04-22 DIAGNOSIS — E119 Type 2 diabetes mellitus without complications: Secondary | ICD-10-CM | POA: Diagnosis not present

## 2017-04-22 DIAGNOSIS — J205 Acute bronchitis due to respiratory syncytial virus: Secondary | ICD-10-CM | POA: Diagnosis not present

## 2017-04-22 DIAGNOSIS — M6281 Muscle weakness (generalized): Secondary | ICD-10-CM | POA: Diagnosis not present

## 2017-04-22 DIAGNOSIS — I2510 Atherosclerotic heart disease of native coronary artery without angina pectoris: Secondary | ICD-10-CM | POA: Diagnosis not present

## 2017-04-25 DIAGNOSIS — I2510 Atherosclerotic heart disease of native coronary artery without angina pectoris: Secondary | ICD-10-CM | POA: Diagnosis not present

## 2017-04-25 DIAGNOSIS — J205 Acute bronchitis due to respiratory syncytial virus: Secondary | ICD-10-CM | POA: Diagnosis not present

## 2017-04-25 DIAGNOSIS — E119 Type 2 diabetes mellitus without complications: Secondary | ICD-10-CM | POA: Diagnosis not present

## 2017-04-25 DIAGNOSIS — M6281 Muscle weakness (generalized): Secondary | ICD-10-CM | POA: Diagnosis not present

## 2017-04-25 DIAGNOSIS — I129 Hypertensive chronic kidney disease with stage 1 through stage 4 chronic kidney disease, or unspecified chronic kidney disease: Secondary | ICD-10-CM | POA: Diagnosis not present

## 2017-04-25 DIAGNOSIS — N183 Chronic kidney disease, stage 3 (moderate): Secondary | ICD-10-CM | POA: Diagnosis not present

## 2017-04-29 ENCOUNTER — Inpatient Hospital Stay: Payer: Medicare Other | Attending: Hematology

## 2017-04-29 ENCOUNTER — Inpatient Hospital Stay: Payer: Medicare Other

## 2017-04-29 VITALS — BP 156/62 | HR 74 | Temp 97.8°F | Resp 18

## 2017-04-29 DIAGNOSIS — D693 Immune thrombocytopenic purpura: Secondary | ICD-10-CM

## 2017-04-29 DIAGNOSIS — E538 Deficiency of other specified B group vitamins: Secondary | ICD-10-CM | POA: Diagnosis not present

## 2017-04-29 DIAGNOSIS — E1122 Type 2 diabetes mellitus with diabetic chronic kidney disease: Secondary | ICD-10-CM | POA: Diagnosis not present

## 2017-04-29 DIAGNOSIS — I129 Hypertensive chronic kidney disease with stage 1 through stage 4 chronic kidney disease, or unspecified chronic kidney disease: Secondary | ICD-10-CM | POA: Diagnosis not present

## 2017-04-29 DIAGNOSIS — Z95828 Presence of other vascular implants and grafts: Secondary | ICD-10-CM

## 2017-04-29 DIAGNOSIS — Z23 Encounter for immunization: Secondary | ICD-10-CM

## 2017-04-29 DIAGNOSIS — N189 Chronic kidney disease, unspecified: Secondary | ICD-10-CM | POA: Diagnosis not present

## 2017-04-29 LAB — CBC WITH DIFFERENTIAL/PLATELET
BASOS ABS: 0 10*3/uL (ref 0.0–0.1)
Basophils Relative: 0 %
Eosinophils Absolute: 0 10*3/uL (ref 0.0–0.5)
Eosinophils Relative: 0 %
HEMATOCRIT: 38.3 % — AB (ref 38.4–49.9)
HEMOGLOBIN: 12.2 g/dL — AB (ref 13.0–17.1)
Lymphocytes Relative: 14 %
Lymphs Abs: 1 10*3/uL (ref 0.9–3.3)
MCH: 29.5 pg (ref 27.2–33.4)
MCHC: 31.9 g/dL — ABNORMAL LOW (ref 32.0–36.0)
MCV: 92.7 fL (ref 79.3–98.0)
Monocytes Absolute: 0.6 10*3/uL (ref 0.1–0.9)
Monocytes Relative: 8 %
NEUTROS ABS: 5.2 10*3/uL (ref 1.5–6.5)
NEUTROS PCT: 78 %
Platelets: 174 10*3/uL (ref 140–400)
RBC: 4.13 MIL/uL — ABNORMAL LOW (ref 4.20–5.82)
RDW: 14.2 % (ref 11.0–14.6)
WBC: 6.8 10*3/uL (ref 4.0–10.3)

## 2017-04-29 MED ORDER — ROMIPLOSTIM 250 MCG ~~LOC~~ SOLR
50.0000 ug | SUBCUTANEOUS | Status: DC
  Administered 2017-04-29: 50 ug via SUBCUTANEOUS
  Filled 2017-04-29: qty 0.1

## 2017-04-29 NOTE — Patient Instructions (Signed)
Romiplostim injection What is this medicine? ROMIPLOSTIM (roe mi PLOE stim) helps your body make more platelets. This medicine is used to treat low platelets caused by chronic idiopathic thrombocytopenic purpura (ITP). This medicine may be used for other purposes; ask your health care provider or pharmacist if you have questions. COMMON BRAND NAME(S): Nplate What should I tell my health care provider before I take this medicine? They need to know if you have any of these conditions: -cancer or myelodysplastic syndrome -low blood counts, like low white cell, platelet, or red cell counts -take medicines that treat or prevent blood clots -an unusual or allergic reaction to romiplostim, mannitol, other medicines, foods, dyes, or preservatives -pregnant or trying to get pregnant -breast-feeding How should I use this medicine? This medicine is for injection under the skin. It is given by a health care professional in a hospital or clinic setting. A special MedGuide will be given to you before your injection. Read this information carefully each time. Talk to your pediatrician regarding the use of this medicine in children. Special care may be needed. Overdosage: If you think you have taken too much of this medicine contact a poison control center or emergency room at once. NOTE: This medicine is only for you. Do not share this medicine with others. What if I miss a dose? It is important not to miss your dose. Call your doctor or health care professional if you are unable to keep an appointment. What may interact with this medicine? Interactions are not expected. This list may not describe all possible interactions. Give your health care provider a list of all the medicines, herbs, non-prescription drugs, or dietary supplements you use. Also tell them if you smoke, drink alcohol, or use illegal drugs. Some items may interact with your medicine. What should I watch for while using this  medicine? Your condition will be monitored carefully while you are receiving this medicine. Visit your prescriber or health care professional for regular checks on your progress and for the needed blood tests. It is important to keep all appointments. What side effects may I notice from receiving this medicine? Side effects that you should report to your doctor or health care professional as soon as possible: -allergic reactions like skin rash, itching or hives, swelling of the face, lips, or tongue -shortness of breath, chest pain, swelling in a leg -unusual bleeding or bruising Side effects that usually do not require medical attention (report to your doctor or health care professional if they continue or are bothersome): -dizziness -headache -muscle aches -pain in arms and legs -stomach pain -trouble sleeping This list may not describe all possible side effects. Call your doctor for medical advice about side effects. You may report side effects to FDA at 1-800-FDA-1088. Where should I keep my medicine? This drug is given in a hospital or clinic and will not be stored at home. NOTE: This sheet is a summary. It may not cover all possible information. If you have questions about this medicine, talk to your doctor, pharmacist, or health care provider.  2018 Elsevier/Gold Standard (2007-10-12 15:13:04)  

## 2017-04-30 ENCOUNTER — Encounter (HOSPITAL_COMMUNITY): Payer: Self-pay | Admitting: Emergency Medicine

## 2017-04-30 ENCOUNTER — Emergency Department (HOSPITAL_COMMUNITY): Payer: Medicare Other

## 2017-04-30 ENCOUNTER — Other Ambulatory Visit: Payer: Self-pay

## 2017-04-30 ENCOUNTER — Emergency Department (HOSPITAL_COMMUNITY)
Admission: EM | Admit: 2017-04-30 | Discharge: 2017-04-30 | Disposition: A | Discharge status: Home / Self Care | Payer: Medicare Other | Attending: Emergency Medicine | Admitting: Emergency Medicine

## 2017-04-30 DIAGNOSIS — I5030 Unspecified diastolic (congestive) heart failure: Secondary | ICD-10-CM | POA: Insufficient documentation

## 2017-04-30 DIAGNOSIS — Z96641 Presence of right artificial hip joint: Secondary | ICD-10-CM | POA: Insufficient documentation

## 2017-04-30 DIAGNOSIS — R5381 Other malaise: Secondary | ICD-10-CM | POA: Insufficient documentation

## 2017-04-30 DIAGNOSIS — N183 Chronic kidney disease, stage 3 (moderate): Secondary | ICD-10-CM | POA: Diagnosis not present

## 2017-04-30 DIAGNOSIS — Z79899 Other long term (current) drug therapy: Secondary | ICD-10-CM | POA: Insufficient documentation

## 2017-04-30 DIAGNOSIS — I130 Hypertensive heart and chronic kidney disease with heart failure and stage 1 through stage 4 chronic kidney disease, or unspecified chronic kidney disease: Secondary | ICD-10-CM | POA: Diagnosis not present

## 2017-04-30 DIAGNOSIS — Z951 Presence of aortocoronary bypass graft: Secondary | ICD-10-CM | POA: Insufficient documentation

## 2017-04-30 DIAGNOSIS — Z87891 Personal history of nicotine dependence: Secondary | ICD-10-CM | POA: Diagnosis not present

## 2017-04-30 DIAGNOSIS — R0602 Shortness of breath: Secondary | ICD-10-CM | POA: Diagnosis not present

## 2017-04-30 DIAGNOSIS — R55 Syncope and collapse: Secondary | ICD-10-CM | POA: Diagnosis not present

## 2017-04-30 DIAGNOSIS — E1140 Type 2 diabetes mellitus with diabetic neuropathy, unspecified: Secondary | ICD-10-CM | POA: Insufficient documentation

## 2017-04-30 DIAGNOSIS — E1122 Type 2 diabetes mellitus with diabetic chronic kidney disease: Secondary | ICD-10-CM | POA: Diagnosis not present

## 2017-04-30 DIAGNOSIS — R404 Transient alteration of awareness: Secondary | ICD-10-CM | POA: Diagnosis not present

## 2017-04-30 DIAGNOSIS — I2510 Atherosclerotic heart disease of native coronary artery without angina pectoris: Secondary | ICD-10-CM | POA: Insufficient documentation

## 2017-04-30 LAB — BASIC METABOLIC PANEL
ANION GAP: 7 (ref 5–15)
BUN: 15 mg/dL (ref 6–20)
CHLORIDE: 109 mmol/L (ref 101–111)
CO2: 24 mmol/L (ref 22–32)
Calcium: 8.9 mg/dL (ref 8.9–10.3)
Creatinine, Ser: 1.26 mg/dL — ABNORMAL HIGH (ref 0.61–1.24)
GFR calc non Af Amer: 46 mL/min — ABNORMAL LOW (ref >60)
GFR, EST AFRICAN AMERICAN: 54 mL/min — AB (ref >60)
GLUCOSE: 148 mg/dL — AB (ref 65–99)
Potassium: 4.3 mmol/L (ref 3.5–5.1)
Sodium: 140 mmol/L (ref 135–145)

## 2017-04-30 LAB — CBC WITH DIFFERENTIAL/PLATELET
Basophils Absolute: 0 10*3/uL (ref 0.0–0.1)
Basophils Relative: 0 %
Eosinophils Absolute: 0 10*3/uL (ref 0.0–0.7)
Eosinophils Relative: 1 %
HEMATOCRIT: 35.2 % — AB (ref 39.0–52.0)
HEMOGLOBIN: 11.2 g/dL — AB (ref 13.0–17.0)
LYMPHS ABS: 1 10*3/uL (ref 0.7–4.0)
Lymphocytes Relative: 15 %
MCH: 29.8 pg (ref 26.0–34.0)
MCHC: 31.8 g/dL (ref 30.0–36.0)
MCV: 93.6 fL (ref 78.0–100.0)
MONO ABS: 0.6 10*3/uL (ref 0.1–1.0)
MONOS PCT: 9 %
NEUTROS ABS: 5 10*3/uL (ref 1.7–7.7)
NEUTROS PCT: 75 %
Platelets: 180 10*3/uL (ref 150–400)
RBC: 3.76 MIL/uL — ABNORMAL LOW (ref 4.22–5.81)
RDW: 14.4 % (ref 11.5–15.5)
WBC: 6.6 10*3/uL (ref 4.0–10.5)

## 2017-04-30 LAB — BRAIN NATRIURETIC PEPTIDE: B Natriuretic Peptide: 780.8 pg/mL — ABNORMAL HIGH (ref 0.0–100.0)

## 2017-04-30 LAB — D-DIMER, QUANTITATIVE: D-Dimer, Quant: 2.88 ug/mL-FEU — ABNORMAL HIGH (ref 0.00–0.50)

## 2017-04-30 MED ORDER — IOPAMIDOL (ISOVUE-370) INJECTION 76%
INTRAVENOUS | Status: AC
  Administered 2017-04-30: 100 mL
  Filled 2017-04-30: qty 100

## 2017-04-30 NOTE — Discharge Instructions (Signed)
The testing today was reassuring.  We are not sure why you are having shortness of breath with exertion.  There is no sign of pneumonia, or heart failure causing your symptoms.  You may benefit from using a diuretic, to reduce the overall fluid volume in your body.  We are referring you to a pulmonologist, for further evaluation and treatment.  In the meantime, avoid exerting yourself.  Return here, if needed, for problems.

## 2017-04-30 NOTE — ED Notes (Signed)
This RN at bedside reviewing results, primary RN aware of pts questions and gauze reapplied where IV was removed at L arm site, pt assisted to stand  By the bed to use urinal, pt very SOB during this process, pt urinated and returned to bed, pt on 2 L [place] at this time

## 2017-04-30 NOTE — ED Notes (Signed)
Patient transported to CT 

## 2017-04-30 NOTE — ED Provider Notes (Signed)
[facility] EMERGENCY DEPARTMENT Provider Note   CSN: [id number] Arrival date & time: 04/30/17  7681     History   Chief Complaint Chief Complaint  Patient presents with  . Shortness of Breath    HPI Lawrence Warner is a 84 y.o. male.  Patient complains of shortness of breath, persistent for several weeks, worsening the last 48 hours.  He receives intermittent chemotherapy for thrombocytopenia, last done yesterday.  He was hospitalized twice a month 6 weeks ago for a respiratory infection, and weakness.  Following the second admission he was placed in a rehab for short period.  He was reportedly treated with oxygen while in the hospital and in rehab but does not use it at his currently he lives in an independent care facility.  He usually gets around in his facility on a scooter.  There has been no recent cough, fever, chills, vomiting or dizziness.  He sees a Dr. regular at his nursing care facility, about once a month.  There are no other known modifying factors.  HPI  Past Medical History:  Diagnosis Date  . BPH (benign prostatic hypertrophy)   . Chronic heart failure ([place])   . Chronic renal insufficiency   . Coronary artery disease    CABG in 2000  . Diabetes type 2, controlled ([place])    Diet-controlled  . Diabetic neuropathy ([place])   . Dyslipidemia   . Hypertension   . ITP (idiopathic thrombocytopenic purpura)   . Osteoarthritis   . Pneumonia     Patient Active Problem List   Diagnosis Date Noted  . Productive cough 03/20/2017  . RSV bronchitis   . Bronchospasm with bronchitis, acute 03/14/2017  . Diastolic congestive heart failure ([place]) 11/26/2016  . CRI (chronic renal insufficiency), stage 3 (moderate) ([place]) 11/26/2016  . LBBB (left bundle branch block) 11/26/2016  . Hypoxia   . Acute respiratory failure with hypoxia ([place]) 07/20/2016  . Respiratory failure ([place]) 02/28/2016  . Acute respiratory failure with hypoxemia ([place]) 02/28/2016  .  Dyslipidemia 02/28/2016  . AKI (acute kidney injury) ([place])   . Influenza   . Sepsis ([place])   . Constipation 12/07/2015  . Bradycardia 11/28/2015  . Port catheter in place 10/24/2015  . GERD (gastroesophageal reflux disease) 10/09/2015  . Gait instability 10/09/2015  . Chest pain 10/05/2015  . Diet-controlled diabetes mellitus ([place]) 05/07/2015  . Hx of CABG 05/07/2015  . Essential hypertension 05/07/2015  . Muscle cramps 04/25/2015  . Shortness of breath 04/11/2015  . Bronchitis 03/14/2015  . Idiopathic thrombocytopenic purpura ([place]) 09/27/2014    Past Surgical History:  Procedure Laterality Date  . CHOLECYSTECTOMY    . CORONARY ARTERY BYPASS GRAFT  2000  . Right hip replacement  2014  . TONSILLECTOMY         Home Medications    Prior to Admission medications   Medication Sig Start Date End Date Taking? Authorizing Provider  acetaminophen (TYLENOL) 500 MG tablet Take 500-1,000 mg by mouth daily as needed for mild pain or headache.     [provider]  albuterol (PROAIR HFA) 108 (90 Base) MCG/ACT inhaler Inhale 1 puff into the lungs every 6 (six) hours as needed for wheezing.     [provider]  Dextran 70-Hypromellose (ARTIFICIAL TEARS PF OP) Place 1 drop into both eyes 2 (two) times daily.     [provider]  docusate sodium (COLACE) 100 MG capsule Take 1 capsule (100 mg total) by mouth 2 (two) times daily as  needed for mild constipation. 03/19/17   Rosita Fire, MD  fluticasone (FLONASE) 50 MCG/ACT nasal spray Place 2 sprays into both nostrils 2 (two) times daily. For 7days 07/26/16   Domenic Polite, MD  hydroxypropyl methylcellulose / hypromellose (ISOPTO TEARS / GONIOVISC) 2.5 % ophthalmic solution Place 1 drop into both eyes 3 (three) times daily as needed for dry eyes.    [provider]  latanoprost (XALATAN) 0.005 % ophthalmic solution Place 1 drop into the left eye at bedtime.    [provider]  losartan (COZAAR)  100 MG tablet Take 100 mg by mouth daily.    [provider]  magnesium oxide (MAG-OX) 400 MG tablet Take 400 mg daily by mouth.    [provider]  Multiple Vitamin (MULTIVITAMIN WITH MINERALS) TABS tablet Take 1 tablet by mouth daily.    [provider]  omeprazole (PRILOSEC) 20 MG capsule Take 1 capsule (20 mg total) by mouth daily before breakfast. 03/19/17   Rosita Fire, MD  predniSONE (DELTASONE) 10 MG tablet Take 1 tablet (10 mg total) by mouth daily. 3 tabs for 2 days, 2 tabs for 2 days and 1 tab for 2 days and then stop. 03/19/17   Rosita Fire, MD  simvastatin (ZOCOR) 20 MG tablet Take 20 mg by mouth at bedtime.    [provider]    Family History Family History  Problem Relation Age of Onset  . Lung disease Father   . Cancer Brother     Social History Social History   Tobacco Use  . Smoking status: Former Smoker    Packs/day: 2.00    Years: 25.00    Pack years: 50.00  . Smokeless tobacco: Never Used  Substance Use Topics  . Alcohol use: No  . Drug use: No     Allergies   Alfuzosin hcl er; Imdur [isosorbide dinitrate]; Penicillins; and Tape   Review of Systems Review of Systems  All other systems reviewed and are negative.    Physical Exam Updated Vital Signs BP (!) 165/55 (BP Location: Right Arm)   Pulse (!) 48 Comment: pulse 39-56  Temp 97.8 F (36.6 C) (Oral)   Resp (!) 22   Ht 5\' 7"  (1.702 m)   Wt 90.7 kg (200 lb)   SpO2 100%   BMI 31.32 kg/m   Physical Exam  Constitutional: He is oriented to person, place, and time. He appears well-developed. He does not appear ill.  Elderly, frail  HENT:  Head: Normocephalic and atraumatic.  Right Ear: External ear normal.  Left Ear: External ear normal.  Eyes: Conjunctivae and EOM are normal. Pupils are equal, round, and reactive to light.  Neck: Normal range of motion and phonation normal. Neck supple.  Cardiovascular: Normal rate, regular rhythm and  normal heart sounds.  Pulmonary/Chest: Effort normal and breath sounds normal. No accessory muscle usage. No tachypnea and no bradypnea. No respiratory distress. He exhibits no bony tenderness.  Abdominal: Soft. There is no tenderness.  Musculoskeletal: Normal range of motion.  Neurological: He is alert and oriented to person, place, and time. No cranial nerve deficit or sensory deficit. He exhibits normal muscle tone. Coordination normal.  No dysarthria, or aphasia.  Skin: Skin is warm, dry and intact. No rash noted. No erythema.  Psychiatric: He has a normal mood and affect. His behavior is normal. Judgment and thought content normal.  Nursing note and vitals reviewed.    ED Treatments / Results  Labs (all labs ordered  are listed, but only abnormal results are displayed) Labs Reviewed - No data to display  EKG  EKG Interpretation None       Radiology No results found.  Procedures Procedures (including critical care time)  Medications Ordered in ED Medications - No data to display   Initial Impression / Assessment and Plan / ED Course  I have reviewed the triage vital signs and the nursing notes.  Pertinent labs & imaging results that were available during my care of the patient were reviewed by me and considered in my medical decision making (see chart for details).      Patient Vitals for the past 24 hrs:  BP Temp Temp src Pulse Resp SpO2 Height Weight  04/30/17 1330 (!) 172/84 - - (!) 45 (!) 21 98 % - -  04/30/17 1230 (!) 169/65 - - (!) 104 20 97 % - -  04/30/17 1200 140/61 - - (!) 47 (!) 23 97 % - -  04/30/17 1130 (!) 155/67 - - (!) 40 (!) 24 99 % - -  04/30/17 1045 (!) 152/56 - - (!) 37 (!) 25 100 % - -  04/30/17 1030 (!) 154/62 - - (!) 38 (!) 23 100 % - -  04/30/17 1000 (!) 166/71 - - 96 (!) 24 97 % - -  04/30/17 0945 (!) 155/54 - - - - 98 % - -  04/30/17 0926 - - - - - 100 % - -  04/30/17 0925 - - - - - - 5\' 7"  (1.702 m) 90.7 kg (200 lb)  04/30/17 0924  (!) 165/55 97.8 F (36.6 C) Oral (!) 48 (!) 22 97 % - -  04/30/17 0920 - - - - - 97 % - -    3:54 PM Reevaluation with update and discussion. After initial assessment and treatment, an updated evaluation reveals clinical status, essentially unchanged.Daleen Bo      Final Clinical Impressions(s) / ED Diagnoses   Final diagnoses:  Shortness of breath  Malaise    Nonspecific shortness of breath without oxygen requirement.  Chest x-ray does not indicate congestive heart failure.  BNP is marginally elevated above baseline.  Elevated d-dimer, with negative imaging to evaluate for pulmonary embolus.  Hemoglobin slightly low 11.2.  Left lateral reassuring.  Creatinine function 1.26 mildly elevated.  Patient has not seen a pulmonologist, regarding his ongoing shortness of breath.  He is stable for discharge with outpatient management.  Nursing Notes Reviewed/ Care Coordinated Applicable Imaging Reviewed Interpretation of Laboratory Data incorporated into ED treatment  The patient appears reasonably screened and/or stabilized for discharge and I doubt any other medical condition or other [facility] requiring further screening, evaluation, or treatment in the ED at this time prior to discharge.  Plan: Home Medications-continue current medications; Home Treatments-rest, fluids; return here if the recommended treatment, does not improve the symptoms; Recommended follow up-PCP, PRN.  Pulmonary referral for further evaluation of ongoing dyspnea.   ED Discharge Orders    None       Daleen Bo, MD 04/30/17 [phone]

## 2017-04-30 NOTE — ED Notes (Signed)
Pt placed on RA

## 2017-04-30 NOTE — ED Triage Notes (Signed)
Pt arrives via EMS from [facility] with complaints of SOB and lightheadedness. Pt reports feeling much better after having 2L [place] given by EMS. EMS reports HR low as 40. Pt A&O

## 2017-04-30 NOTE — ED Notes (Signed)
Pt returned from CT.  Bed linens straightened.  Pt stood at bedside to use urinal.  Voided.  Tolerated fairly as pt has exertional dyspnea.  Pt safety checks made, call bell within reach.  Will continue to monitor for any needs or changes

## 2017-05-01 DIAGNOSIS — I10 Essential (primary) hypertension: Secondary | ICD-10-CM | POA: Diagnosis not present

## 2017-05-01 DIAGNOSIS — I5022 Chronic systolic (congestive) heart failure: Secondary | ICD-10-CM | POA: Diagnosis not present

## 2017-05-01 DIAGNOSIS — R0902 Hypoxemia: Secondary | ICD-10-CM | POA: Diagnosis not present

## 2017-05-01 DIAGNOSIS — E119 Type 2 diabetes mellitus without complications: Secondary | ICD-10-CM | POA: Diagnosis not present

## 2017-05-05 DIAGNOSIS — I2510 Atherosclerotic heart disease of native coronary artery without angina pectoris: Secondary | ICD-10-CM | POA: Diagnosis not present

## 2017-05-05 DIAGNOSIS — I129 Hypertensive chronic kidney disease with stage 1 through stage 4 chronic kidney disease, or unspecified chronic kidney disease: Secondary | ICD-10-CM | POA: Diagnosis not present

## 2017-05-05 DIAGNOSIS — J205 Acute bronchitis due to respiratory syncytial virus: Secondary | ICD-10-CM | POA: Diagnosis not present

## 2017-05-05 DIAGNOSIS — M6281 Muscle weakness (generalized): Secondary | ICD-10-CM | POA: Diagnosis not present

## 2017-05-05 DIAGNOSIS — E119 Type 2 diabetes mellitus without complications: Secondary | ICD-10-CM | POA: Diagnosis not present

## 2017-05-05 DIAGNOSIS — N183 Chronic kidney disease, stage 3 (moderate): Secondary | ICD-10-CM | POA: Diagnosis not present

## 2017-05-06 ENCOUNTER — Other Ambulatory Visit: Payer: Medicare Other

## 2017-05-06 ENCOUNTER — Ambulatory Visit: Payer: Medicare Other

## 2017-05-06 ENCOUNTER — Ambulatory Visit: Payer: Medicare Other | Admitting: Hematology

## 2017-05-06 DIAGNOSIS — I129 Hypertensive chronic kidney disease with stage 1 through stage 4 chronic kidney disease, or unspecified chronic kidney disease: Secondary | ICD-10-CM | POA: Diagnosis not present

## 2017-05-06 DIAGNOSIS — N183 Chronic kidney disease, stage 3 (moderate): Secondary | ICD-10-CM | POA: Diagnosis not present

## 2017-05-06 DIAGNOSIS — E119 Type 2 diabetes mellitus without complications: Secondary | ICD-10-CM | POA: Diagnosis not present

## 2017-05-06 DIAGNOSIS — M6281 Muscle weakness (generalized): Secondary | ICD-10-CM | POA: Diagnosis not present

## 2017-05-06 DIAGNOSIS — J205 Acute bronchitis due to respiratory syncytial virus: Secondary | ICD-10-CM | POA: Diagnosis not present

## 2017-05-06 DIAGNOSIS — I2510 Atherosclerotic heart disease of native coronary artery without angina pectoris: Secondary | ICD-10-CM | POA: Diagnosis not present

## 2017-05-07 DIAGNOSIS — J205 Acute bronchitis due to respiratory syncytial virus: Secondary | ICD-10-CM | POA: Diagnosis not present

## 2017-05-07 DIAGNOSIS — I2510 Atherosclerotic heart disease of native coronary artery without angina pectoris: Secondary | ICD-10-CM | POA: Diagnosis not present

## 2017-05-07 DIAGNOSIS — I129 Hypertensive chronic kidney disease with stage 1 through stage 4 chronic kidney disease, or unspecified chronic kidney disease: Secondary | ICD-10-CM | POA: Diagnosis not present

## 2017-05-07 DIAGNOSIS — E119 Type 2 diabetes mellitus without complications: Secondary | ICD-10-CM | POA: Diagnosis not present

## 2017-05-07 DIAGNOSIS — M6281 Muscle weakness (generalized): Secondary | ICD-10-CM | POA: Diagnosis not present

## 2017-05-07 DIAGNOSIS — N183 Chronic kidney disease, stage 3 (moderate): Secondary | ICD-10-CM | POA: Diagnosis not present

## 2017-05-08 DIAGNOSIS — E119 Type 2 diabetes mellitus without complications: Secondary | ICD-10-CM | POA: Diagnosis not present

## 2017-05-08 DIAGNOSIS — J342 Deviated nasal septum: Secondary | ICD-10-CM | POA: Diagnosis not present

## 2017-05-08 DIAGNOSIS — J322 Chronic ethmoidal sinusitis: Secondary | ICD-10-CM | POA: Diagnosis not present

## 2017-05-08 DIAGNOSIS — J205 Acute bronchitis due to respiratory syncytial virus: Secondary | ICD-10-CM | POA: Diagnosis not present

## 2017-05-08 DIAGNOSIS — J320 Chronic maxillary sinusitis: Secondary | ICD-10-CM | POA: Diagnosis not present

## 2017-05-08 DIAGNOSIS — M6281 Muscle weakness (generalized): Secondary | ICD-10-CM | POA: Diagnosis not present

## 2017-05-08 DIAGNOSIS — I129 Hypertensive chronic kidney disease with stage 1 through stage 4 chronic kidney disease, or unspecified chronic kidney disease: Secondary | ICD-10-CM | POA: Diagnosis not present

## 2017-05-08 DIAGNOSIS — N183 Chronic kidney disease, stage 3 (moderate): Secondary | ICD-10-CM | POA: Diagnosis not present

## 2017-05-08 DIAGNOSIS — I2510 Atherosclerotic heart disease of native coronary artery without angina pectoris: Secondary | ICD-10-CM | POA: Diagnosis not present

## 2017-05-08 NOTE — Progress Notes (Signed)
.    Hematology oncology clinic note  Date of service: 05/13/17   Patient Care Team: Clovia Cuff, MD as PCP - General (Internal Medicine) Minus Breeding, MD as PCP - Cardiology (Cardiology) Rico Sheehan (Hematology and Oncology)  CHIEF COMPLAINTS/PURPOSE OF CONSULTATION: Follow-up for ITP   Diagnosis: Idiopathic thrombocytopenic purpura   Current treatment: Nplate q2 weekly to maintain reasonable platelet counts >50K.  Requirement have ranged from 40mcg/kg to 12mcg/kg. Recently stable counts with 0.5-1 mcg/kg qweekly  Previous treatment: Steroids, IVIG.  HISTORY OF PRESENTING ILLNESS:  please see my previous clinic note from 09/28/2014 for details of initial presentation and course of treatment.  Interval history   Lawrence Warner presents to the clinic today for f/u of his ITP. Of note, since last visit pt was admitted to the hospital on 03/20/17 for respiratory distress and again on 03/20/17 for acute kidney infection. He preseted to the ED on 04/30/17 for SOB.   Today he presents to the clinic on oxygen. He has been on oxygen for 2 weeks now, ordered by his PCP, Dr. Daphene Jaeger. He sees PCP monthly at his living facility. He notes his oxygen levels were low after hospitialization in 02/2017, around 87%. He will see a pulmonologist tomorrow for further intervention and assessment. He notes he feels his body is on decline lately and overall uncomfortable lately. He notes his recovery is slow overall.   On review of symptoms, pt denies swelling in extremities as he manages with lasix as needed. He notes he lost 15 pounds over the last 6 months. He notes he eats regularly. He contributes it to his previous infection. He denies fever or chills. He notes SOB with most activity, fatigues early. His cough and nasal drip has improved with oxygen.     MEDICAL HISTORY:  Past Medical History:  Diagnosis Date  . BPH (benign prostatic hypertrophy)   . Chronic heart failure ([place])   . Chronic  renal insufficiency   . Coronary artery disease    CABG in 2000  . Diabetes type 2, controlled ([place])    Diet-controlled  . Diabetic neuropathy ([place])   . Dyslipidemia   . Hypertension   . ITP (idiopathic thrombocytopenic purpura)   . Osteoarthritis   . Pneumonia     SURGICAL HISTORY: Past Surgical History:  Procedure Laterality Date  . CHOLECYSTECTOMY    . CORONARY ARTERY BYPASS GRAFT  2000  . Right hip replacement  2014  . TONSILLECTOMY      SOCIAL HISTORY: Social History   Socioeconomic History  . Marital status: Single    Spouse name: Not on file  . Number of children: Not on file  . Years of education: Not on file  . Highest education level: Not on file  Social Needs  . Financial resource strain: Not on file  . Food insecurity - worry: Not on file  . Food insecurity - inability: Not on file  . Transportation needs - medical: Not on file  . Transportation needs - non-medical: Not on file  Occupational History  . Occupation: Salesman  Tobacco Use  . Smoking status: Former Smoker    Packs/day: 2.00    Years: 25.00    Pack years: 50.00  . Smokeless tobacco: Never Used  Substance and Sexual Activity  . Alcohol use: No  . Drug use: No  . Sexual activity: Not Currently  Other Topics Concern  . Not on file  Social History Narrative    Two children.     He  is living in [place] which is an assisted living facility. One of his son is also moving to [facility] and has a Designer, jewellery in religious studies. Patient notes that his other son is a Systems analyst with multiple awards.  FAMILY HISTORY: Family History  Problem Relation Age of Onset  . Lung disease Father   . Cancer Brother     ALLERGIES:  is allergic to alfuzosin hcl er; imdur [isosorbide dinitrate]; penicillins; and tape.  MEDICATIONS:  Current Outpatient Medications  Medication Sig Dispense Refill  . acetaminophen (TYLENOL) 500 MG tablet Take 500-1,000 mg by mouth daily as needed for  mild pain or headache.     . albuterol (PROAIR HFA) 108 (90 Base) MCG/ACT inhaler Inhale 1 puff into the lungs every 6 (six) hours as needed for wheezing.     Marland Kitchen alfuzosin (UROXATRAL) 10 MG 24 hr tablet Take 10 mg by mouth daily with breakfast.    . brimonidine (ALPHAGAN) 0.2 % ophthalmic solution Place 1 drop into both eyes 3 (three) times daily.    . clobetasol cream (TEMOVATE) 1.94 % Apply 1 application topically 2 (two) times daily. To scalp    . cycloSPORINE (RESTASIS) 0.05 % ophthalmic emulsion Place 1 drop into both eyes every 12 (twelve) hours.     . Dextran 70-Hypromellose (ARTIFICIAL TEARS PF OP) Place 1 drop into both eyes 4 (four) times daily.     Marland Kitchen docusate sodium (COLACE) 100 MG capsule Take 1 capsule (100 mg total) by mouth 2 (two) times daily as needed for mild constipation. 30 capsule 0  . fluoruracil (CARAC) 0.5 % cream Apply 1 application topically at bedtime.    . fluticasone (FLONASE) 50 MCG/ACT nasal spray Place 2 sprays into both nostrils 2 (two) times daily. For 7days 5 g 0  . hydroxypropyl methylcellulose / hypromellose (ISOPTO TEARS / GONIOVISC) 2.5 % ophthalmic solution Place 1 drop into both eyes 3 (three) times daily as needed for dry eyes.    Marland Kitchen latanoprost (XALATAN) 0.005 % ophthalmic solution Place 1 drop into the left eye at bedtime.    Marland Kitchen losartan (COZAAR) 100 MG tablet Take 100 mg by mouth daily.    . magnesium oxide (MAG-OX) 400 MG tablet Take 400 mg daily by mouth.    . Multiple Vitamin (MULTIVITAMIN WITH MINERALS) TABS tablet Take 1 tablet by mouth daily.    . nitroGLYCERIN (NITROSTAT) 0.4 MG SL tablet Place 0.4 mg under the tongue every 5 (five) minutes as needed for chest pain.    Marland Kitchen omeprazole (PRILOSEC) 20 MG capsule Take 1 capsule (20 mg total) by mouth daily before breakfast.    . predniSONE (DELTASONE) 10 MG tablet Take 1 tablet (10 mg total) by mouth daily. 3 tabs for 2 days, 2 tabs for 2 days and 1 tab for 2 days and then stop. 12 tablet 0   No current  facility-administered medications for this visit.    Facility-Administered Medications Ordered in Other Visits  Medication Dose Route Frequency Provider Last Rate Last Dose  . cyanocobalamin ((VITAMIN B-12)) injection 1,000 mcg  1,000 mcg Subcutaneous Q30 days [name], MD   1,000 mcg at 03/13/16 1348  . cyanocobalamin ((VITAMIN B-12)) injection 1,000 mcg  1,000 mcg Subcutaneous Q30 days [name], MD   1,000 mcg at 05/14/16 1001  . cyanocobalamin ((VITAMIN B-12)) injection 1,000 mcg  1,000 mcg Subcutaneous Q30 days [name], MD   1,000 mcg at 09/10/16 1022  . cyanocobalamin ((VITAMIN B-12)) injection 1,000  mcg  1,000 mcg Subcutaneous Q30 days [name], MD   1,000 mcg at 12/03/16 1038  . cyanocobalamin ((VITAMIN B-12)) injection 1,000 mcg  1,000 mcg Subcutaneous Q30 days [name], MD      . romiPLOStim (NPLATE) injection 45 mcg  0.5 mcg/kg Subcutaneous Weekly [name], MD      . romiPLOStim (NPLATE) injection 50 mcg  0.5 mcg/kg Subcutaneous Weekly [name], MD   50 mcg at 03/13/16 1349  . romiPLOStim (NPLATE) injection 50 mcg  50 mcg Subcutaneous Weekly [name], MD   50 mcg at 05/07/16 1106  . romiPLOStim (NPLATE) injection 50 mcg  0.5 mcg/kg Subcutaneous Weekly [name], MD   50 mcg at 05/14/16 1001  . romiPLOStim (NPLATE) injection 50 mcg  50 mcg Subcutaneous Weekly [name], MD   50 mcg at 08/13/16 0931  . romiPLOStim (NPLATE) injection 50 mcg  50 mcg Subcutaneous Weekly [name], MD   50 mcg at 08/27/16 8101  . romiPLOStim (NPLATE) injection 50 mcg  50 mcg Subcutaneous Weekly [name], MD   50 mcg at 09/03/16 1027  . romiPLOStim (NPLATE) injection 50 mcg  0.5 mcg/kg Subcutaneous Weekly [name], MD   50 mcg at 09/10/16 1023  . romiPLOStim (NPLATE) injection 50 mcg  0.5 mcg/kg Subcutaneous Weekly [name], MD   50 mcg at 12/03/16  1038  . romiPLOStim (NPLATE) injection 50 mcg  50 mcg Subcutaneous Weekly [name], MD   50 mcg at 02/11/17 1039  . romiPLOStim (NPLATE) injection 50 mcg  50 mcg Subcutaneous Weekly [name], MD   50 mcg at 04/01/17 1150   Romiplostim 5 mcg/kg weekly   REVIEW OF SYSTEMS:   .10 Point review of Systems was done is negative except as noted above.   PHYSICAL EXAMINATION: ECOG PERFORMANCE STATUS: 1 - Symptomatic but completely ambulatory  Vitals:   05/13/17 1006  BP: (!) 173/48  Pulse: (!) 51  Resp: (!) 24  Temp: (!) 97.5 F (36.4 C)  SpO2: 96%   Filed Weights   05/13/17 1006  Weight: 203 lb 1.6 oz (92.1 kg)    . GENERAL:alert,elderly gentleman on [place] O2 @ 2L/min SKIN: no acute rashes, no significant lesions EYES: conjunctiva are pink and non-injected, sclera anicteric OROPHARYNX: MMM, no exudates, no oropharyngeal erythema or ulceration NECK: supple, no JVD LYMPH:  no palpable lymphadenopathy in the cervical, axillary or inguinal regions LUNGS: clear to auscultation b/l with normal respiratory effort HEART: regular rate & rhythm ABDOMEN:  normoactive bowel sounds , non tender, not distended. Extremity: no pedal edema PSYCH: alert & oriented x 3 with fluent speech NEURO: no focal motor/sensory deficits   LABORATORY DATA:  . CBC Latest Ref Rng & Units 05/13/2017 04/30/2017 04/29/2017  WBC 4.0 - 10.3 K/uL 6.3 6.6 6.8  Hemoglobin 13.0 - 17.1 g/dL 11.7(L) 11.2(L) 12.2(L)  Hematocrit 38.4 - 49.9 % 36.9(L) 35.2(L) 38.3(L)  Platelets 140 - 400 K/uL 226 180 174    CMP Latest Ref Rng & Units 04/30/2017 04/15/2017 04/01/2017  Glucose 65 - 99 mg/dL 148(H) 118 132  BUN 6 - 20 mg/dL 15 13 23   Creatinine 0.61 - 1.24 mg/dL 1.26(H) 1.51(H) 1.55(H)  Sodium 135 - 145 mmol/L 140 142 139  Potassium 3.5 - 5.1 mmol/L 4.3 4.5 4.9  Chloride 101 - 111 mmol/L 109 109 109  CO2 22 - 32 mmol/L 24 24 22   Calcium 8.9 - 10.3 mg/dL 8.9  9.2 8.3(L)  Total Protein 6.4 - 8.3 g/dL -  6.2(L) -  Total Bilirubin 0.2 - 1.2 mg/dL - 0.3 -  Alkaline Phos 40 - 150 U/L - 70 -  AST 5 - 34 U/L - 18 -  ALT 0 - 55 U/L - 9 -    ASSESSMENT & PLAN:   84 y.o. Caucasian male with multiple medical comorbidities with  #1 Chronic ITP since 1989. Has previously been responsive to steroids and has received IVIG on one occasion. He has been maintained on 3-29mcg/kg weekly of Nplate since mid 1275 initially with Dr. Arvin Collard at [facility] and then with Dr. Jimmie Molly at [facility] in [place].  No issues with bleeding . Ultrasound abdomen showed normal spleen size SPEP- no obvious monoclonal protein. IFE showed possibility of an restrictive bands in the IgG and Lambda lanes. Patient's platelet counts have remained remarkably stable at Romiplostim doses of  0.5 mcg/kg every 2 weeks   #2 H/o B12 deficiency  Plan  -labs stable at this time, plt at 226K today  -Continue his scheduled dose of Nplate at 1.7GYF/VC today and every 2 weeks and adjust dose to maintain platelet counts >50k. If further downtrend in platelets might need to adjust dose/go back to weekly shots. -continue B12 replacement monthly for B12 deficiency. -Avoid NSAIDs -Continue labs q2weeks   #3  Coronary artery disease status post CABG in year 2000. Not on aspirin due to his ITP . BNP was elevated. ECHO today shows mildly reduced systolic function and LVH  Mild intermittent chest pain. No shortness of breath. -- was evaluated for his chronic intermittent CP by cardiology - Dr Minus Breeding and was started on Imdur.  #4 chronic kidney disease  -Continue to management as per primary care physician  #5 Recent respiratory infection -on home O2 [place] 2L/min  Continue Nplate q2weeks Labs [id number] RTC with Dr Irene Limbo in 3 months  Total time spent 15 minutes more than 50% time on direct patient contact counseling and coordination of care.  Sullivan Lone MD MS Hematology/Oncology Physician [facility]  (Office):       [phone] (Work cell):  [phone] (Fax):           [phone]  This document serves as a record of services personally performed by Sullivan Lone, MD. It was created on his behalf by Joslyn Devon, a trained medical scribe. The creation of this record is based on the scribe's personal observations and the provider's statements to them.    .I have reviewed the above documentation for accuracy and completeness, and I agree with the above. .I .[name] MD MS

## 2017-05-09 DIAGNOSIS — M6281 Muscle weakness (generalized): Secondary | ICD-10-CM | POA: Diagnosis not present

## 2017-05-09 DIAGNOSIS — I2510 Atherosclerotic heart disease of native coronary artery without angina pectoris: Secondary | ICD-10-CM | POA: Diagnosis not present

## 2017-05-09 DIAGNOSIS — N183 Chronic kidney disease, stage 3 (moderate): Secondary | ICD-10-CM | POA: Diagnosis not present

## 2017-05-09 DIAGNOSIS — E119 Type 2 diabetes mellitus without complications: Secondary | ICD-10-CM | POA: Diagnosis not present

## 2017-05-09 DIAGNOSIS — J205 Acute bronchitis due to respiratory syncytial virus: Secondary | ICD-10-CM | POA: Diagnosis not present

## 2017-05-09 DIAGNOSIS — I129 Hypertensive chronic kidney disease with stage 1 through stage 4 chronic kidney disease, or unspecified chronic kidney disease: Secondary | ICD-10-CM | POA: Diagnosis not present

## 2017-05-12 DIAGNOSIS — I2510 Atherosclerotic heart disease of native coronary artery without angina pectoris: Secondary | ICD-10-CM | POA: Diagnosis not present

## 2017-05-12 DIAGNOSIS — M6281 Muscle weakness (generalized): Secondary | ICD-10-CM | POA: Diagnosis not present

## 2017-05-12 DIAGNOSIS — J205 Acute bronchitis due to respiratory syncytial virus: Secondary | ICD-10-CM | POA: Diagnosis not present

## 2017-05-12 DIAGNOSIS — E119 Type 2 diabetes mellitus without complications: Secondary | ICD-10-CM | POA: Diagnosis not present

## 2017-05-12 DIAGNOSIS — N183 Chronic kidney disease, stage 3 (moderate): Secondary | ICD-10-CM | POA: Diagnosis not present

## 2017-05-12 DIAGNOSIS — I129 Hypertensive chronic kidney disease with stage 1 through stage 4 chronic kidney disease, or unspecified chronic kidney disease: Secondary | ICD-10-CM | POA: Diagnosis not present

## 2017-05-13 ENCOUNTER — Ambulatory Visit: Payer: Medicare Other | Admitting: Hematology

## 2017-05-13 ENCOUNTER — Inpatient Hospital Stay: Payer: Medicare Other

## 2017-05-13 ENCOUNTER — Inpatient Hospital Stay (HOSPITAL_BASED_OUTPATIENT_CLINIC_OR_DEPARTMENT_OTHER): Payer: Medicare Other | Admitting: Hematology

## 2017-05-13 ENCOUNTER — Telehealth: Payer: Self-pay

## 2017-05-13 ENCOUNTER — Other Ambulatory Visit: Payer: Medicare Other

## 2017-05-13 ENCOUNTER — Encounter: Payer: Self-pay | Admitting: Hematology

## 2017-05-13 ENCOUNTER — Ambulatory Visit: Payer: Medicare Other

## 2017-05-13 VITALS — BP 173/48 | HR 51 | Temp 97.5°F | Resp 24 | Ht 67.0 in | Wt 203.1 lb

## 2017-05-13 DIAGNOSIS — N189 Chronic kidney disease, unspecified: Secondary | ICD-10-CM | POA: Diagnosis not present

## 2017-05-13 DIAGNOSIS — D693 Immune thrombocytopenic purpura: Secondary | ICD-10-CM

## 2017-05-13 DIAGNOSIS — N183 Chronic kidney disease, stage 3 (moderate): Secondary | ICD-10-CM | POA: Diagnosis not present

## 2017-05-13 DIAGNOSIS — Z95828 Presence of other vascular implants and grafts: Secondary | ICD-10-CM

## 2017-05-13 DIAGNOSIS — Z23 Encounter for immunization: Secondary | ICD-10-CM

## 2017-05-13 DIAGNOSIS — I2510 Atherosclerotic heart disease of native coronary artery without angina pectoris: Secondary | ICD-10-CM | POA: Diagnosis not present

## 2017-05-13 DIAGNOSIS — E119 Type 2 diabetes mellitus without complications: Secondary | ICD-10-CM | POA: Diagnosis not present

## 2017-05-13 DIAGNOSIS — E1122 Type 2 diabetes mellitus with diabetic chronic kidney disease: Secondary | ICD-10-CM

## 2017-05-13 DIAGNOSIS — E538 Deficiency of other specified B group vitamins: Secondary | ICD-10-CM

## 2017-05-13 DIAGNOSIS — J205 Acute bronchitis due to respiratory syncytial virus: Secondary | ICD-10-CM | POA: Diagnosis not present

## 2017-05-13 DIAGNOSIS — I129 Hypertensive chronic kidney disease with stage 1 through stage 4 chronic kidney disease, or unspecified chronic kidney disease: Secondary | ICD-10-CM

## 2017-05-13 DIAGNOSIS — M6281 Muscle weakness (generalized): Secondary | ICD-10-CM | POA: Diagnosis not present

## 2017-05-13 LAB — CBC WITH DIFFERENTIAL/PLATELET
BASOS ABS: 0 10*3/uL (ref 0.0–0.1)
BASOS PCT: 0 %
EOS ABS: 0.1 10*3/uL (ref 0.0–0.5)
Eosinophils Relative: 2 %
HCT: 36.9 % — ABNORMAL LOW (ref 38.4–49.9)
HEMOGLOBIN: 11.7 g/dL — AB (ref 13.0–17.1)
Lymphocytes Relative: 17 %
Lymphs Abs: 1.1 10*3/uL (ref 0.9–3.3)
MCH: 29.5 pg (ref 27.2–33.4)
MCHC: 31.7 g/dL — ABNORMAL LOW (ref 32.0–36.0)
MCV: 92.9 fL (ref 79.3–98.0)
Monocytes Absolute: 0.6 10*3/uL (ref 0.1–0.9)
Monocytes Relative: 10 %
NEUTROS PCT: 71 %
Neutro Abs: 4.5 10*3/uL (ref 1.5–6.5)
PLATELETS: 226 10*3/uL (ref 140–400)
RBC: 3.97 MIL/uL — AB (ref 4.20–5.82)
RDW: 13.8 % (ref 11.0–14.6)
WBC: 6.3 10*3/uL (ref 4.0–10.3)

## 2017-05-13 MED ORDER — CYANOCOBALAMIN 1000 MCG/ML IJ SOLN
1000.0000 ug | INTRAMUSCULAR | Status: DC
  Administered 2017-05-13: 1000 ug via SUBCUTANEOUS

## 2017-05-13 MED ORDER — ROMIPLOSTIM 250 MCG ~~LOC~~ SOLR
50.0000 ug | SUBCUTANEOUS | Status: DC
  Administered 2017-05-13: 50 ug via SUBCUTANEOUS
  Filled 2017-05-13: qty 0.1

## 2017-05-13 NOTE — Patient Instructions (Signed)
Thank you for choosing [facility] to provide your oncology and hematology care.  To afford each patient quality time with our providers, please arrive 30 minutes before your scheduled appointment time.  If you arrive late for your appointment, you may be asked to reschedule.  We strive to give you quality time with our providers, and arriving late affects you and other patients whose appointments are after yours.   If you are a no show for multiple scheduled visits, you may be dismissed from the clinic at the providers discretion.    Again, thank you for choosing [facility], our hope is that these requests will decrease the amount of time that you wait before being seen by our physicians.  ______________________________________________________________________  Should you have questions after your visit to the [facility], please contact our office at [phone] between the hours of 8:30 and 4:30 p.m.    Voicemails left after 4:30p.m will not be returned until the following business day.    For prescription refill requests, please have your pharmacy contact us directly.  Please also try to allow 48 hours for prescription requests.    Please contact the scheduling department for questions regarding scheduling.  For scheduling of procedures such as PET scans, CT scans, MRI, Ultrasound, etc please contact central scheduling at [phone].    Resources For Cancer Patients and Caregivers:   Oncolink.org:  A wonderful resource for patients and healthcare providers for information regarding your disease, ways to tract your treatment, what to expect, etc.     American Cancer Society:  800-227-2345  Can help patients locate various types of support and financial assistance  Cancer Care: 1-800-813-HOPE (4673) Provides financial assistance, online support groups, medication/co-pay assistance.    [place] DSS:  336-641-3447 Where to apply for food  stamps, Medicaid, and utility assistance  Medicare Rights Center: 800-333-4114 Helps people with Medicare understand their rights and benefits, navigate the Medicare system, and secure the quality healthcare they deserve  SC[employer]: 336-333-6589 [place] [employer]'s shared-ride transportation service for eligible riders who have a disability that prevents them from riding the fixed route bus.    For additional information on assistance programs please contact our social worker:   Grier Hock/Abigail Elmore:  [phone]            

## 2017-05-13 NOTE — Telephone Encounter (Signed)
Printed avs and calender of upcoming appointment. Per 3/19 los 

## 2017-05-13 NOTE — Patient Instructions (Signed)
Romiplostim injection What is this medicine? ROMIPLOSTIM ([place] mi PLOE stim) helps your body make more platelets. This medicine is used to treat low platelets caused by chronic idiopathic thrombocytopenic purpura (ITP). This medicine may be used for other purposes; ask your health care provider or pharmacist if you have questions. What should I tell my health care provider before I take this medicine? They need to know if you have any of these conditions: -cancer or myelodysplastic syndrome -low blood counts, like low white cell, platelet, or red cell counts -take medicines that treat or prevent blood clots -an unusual or allergic reaction to romiplostim, mannitol, other medicines, foods, dyes, or preservatives -pregnant or trying to get pregnant -breast-feeding How should I use this medicine? This medicine is for injection under the skin. It is given by a health care professional in a hospital or clinic setting. A special MedGuide will be given to you before your injection. Read this information carefully each time. Talk to your pediatrician regarding the use of this medicine in children. Special care may be needed. Overdosage: If you think you have taken too much of this medicine contact a poison control center or emergency room at once. NOTE: This medicine is only for you. Do not share this medicine with others. What if I miss a dose? It is important not to miss your dose. Call your doctor or health care professional if you are unable to keep an appointment. What may interact with this medicine? Interactions are not expected. This list may not describe all possible interactions. Give your health care provider a list of all the medicines, herbs, non-prescription drugs, or dietary supplements you use. Also tell them if you smoke, drink alcohol, or use illegal drugs. Some items may interact with your medicine. What should I watch for while using this medicine? Your condition will be monitored  carefully while you are receiving this medicine. Visit your prescriber or health care professional for regular checks on your progress and for the needed blood tests. It is important to keep all appointments. What side effects may I notice from receiving this medicine? Side effects that you should report to your doctor or health care professional as soon as possible: -allergic reactions like skin rash, itching or hives, swelling of the face, lips, or tongue -shortness of breath, chest pain, swelling in a leg -unusual bleeding or bruising Side effects that usually do not require medical attention (report to your doctor or health care professional if they continue or are bothersome): -dizziness -headache -muscle aches -pain in arms and legs -stomach pain -trouble sleeping This list may not describe all possible side effects. Call your doctor for medical advice about side effects. You may report side effects to FDA at 1-800-FDA-1088. Where should I keep my medicine? This drug is given in a hospital or clinic and will not be stored at home. NOTE: This sheet is a summary. It may not cover all possible information. If you have questions about this medicine, talk to your doctor, pharmacist, or health care provider.    2016, Elsevier/Gold Standard. (2007-10-12 15:13:04) Cyanocobalamin, Vitamin B12 injection What is this medicine? CYANOCOBALAMIN (sye an oh koe BAL a min) is a man made form of vitamin B12. Vitamin B12 is used in the growth of healthy blood cells, nerve cells, and proteins in the body. It also helps with the metabolism of fats and carbohydrates. This medicine is used to treat people who can not absorb vitamin B12. This medicine may be used for other   purposes; ask your health care provider or pharmacist if you have questions. What should I tell my health care provider before I take this medicine? They need to know if you have any of these conditions: -kidney disease -Leber's  disease -megaloblastic anemia -an unusual or allergic reaction to cyanocobalamin, cobalt, other medicines, foods, dyes, or preservatives -pregnant or trying to get pregnant -breast-feeding How should I use this medicine? This medicine is injected into a muscle or deeply under the skin. It is usually given by a health care professional in a clinic or doctor's office. However, your doctor may teach you how to inject yourself. Follow all instructions. Talk to your pediatrician regarding the use of this medicine in children. Special care may be needed. Overdosage: If you think you have taken too much of this medicine contact a poison control center or emergency room at once. NOTE: This medicine is only for you. Do not share this medicine with others. What if I miss a dose? If you are given your dose at a clinic or doctor's office, call to reschedule your appointment. If you give your own injections and you miss a dose, take it as soon as you can. If it is almost time for your next dose, take only that dose. Do not take double or extra doses. What may interact with this medicine? -colchicine -heavy alcohol intake This list may not describe all possible interactions. Give your health care provider a list of all the medicines, herbs, non-prescription drugs, or dietary supplements you use. Also tell them if you smoke, drink alcohol, or use illegal drugs. Some items may interact with your medicine. What should I watch for while using this medicine? Visit your doctor or health care professional regularly. You may need blood work done while you are taking this medicine. You may need to follow a special diet. Talk to your doctor. Limit your alcohol intake and avoid smoking to get the best benefit. What side effects may I notice from receiving this medicine? Side effects that you should report to your doctor or health care professional as soon as possible: -allergic reactions like skin rash, itching or  hives, swelling of the face, lips, or tongue -blue tint to skin -chest tightness, pain -difficulty breathing, wheezing -dizziness -red, swollen painful area on the leg Side effects that usually do not require medical attention (report to your doctor or health care professional if they continue or are bothersome): -diarrhea -headache This list may not describe all possible side effects. Call your doctor for medical advice about side effects. You may report side effects to FDA at 1-800-FDA-1088. Where should I keep my medicine? Keep out of the reach of children. Store at room temperature between 15 and 30 degrees C (59 and 85 degrees F). Protect from light. Throw away any unused medicine after the expiration date. NOTE: This sheet is a summary. It may not cover all possible information. If you have questions about this medicine, talk to your doctor, pharmacist, or health care provider.    2016, Elsevier/Gold Standard. (2007-05-25 22:10:20)  

## 2017-05-14 ENCOUNTER — Encounter: Payer: Self-pay | Admitting: Pulmonary Disease

## 2017-05-14 ENCOUNTER — Ambulatory Visit (INDEPENDENT_AMBULATORY_CARE_PROVIDER_SITE_OTHER): Payer: Medicare Other | Admitting: Pulmonary Disease

## 2017-05-14 VITALS — BP 124/72 | HR 42

## 2017-05-14 DIAGNOSIS — J209 Acute bronchitis, unspecified: Secondary | ICD-10-CM | POA: Diagnosis not present

## 2017-05-14 DIAGNOSIS — J9611 Chronic respiratory failure with hypoxia: Secondary | ICD-10-CM

## 2017-05-14 DIAGNOSIS — R0602 Shortness of breath: Secondary | ICD-10-CM | POA: Diagnosis not present

## 2017-05-14 NOTE — Progress Notes (Signed)
Subjective:    Patient ID: Lawrence Warner, male    DOB: December 04, 1918, 84 y.o.   MRN: [id number]  HPI  84 year old remote smoker presents for evaluation of hypoxia noted during recent ED evaluation 3/6.  He lives in independent living and gets around with a wheelchair/scooter, is accompanied by his daughter-in-law [name] today. Reports chronic shortness of breath for many years.  He smoked about 40 pack years before he quit in his late 80s.  He has chronic diastolic heart failure and CKD and takes Lasix on an as-needed basis based on pedal edema. He also has ITP.  He has chronic sinus bradycardia He was hospitalized 07/2016 for community-acquired pneumonia and respiratory failure.  He was again hospitalized in January 2019 for acute respiratory failure with hypoxia in the setting of acute viral bronchitis with RSV and bronchospasm He developed increasing dyspnea on 3/6 and had an emergency room visit, I have reviewed ED note, labs and imaging studies including CT angiogram which was negative for pulmonary embolism but showed small right effusion and loculated fluid along the left major fissure.  Incidentally thoracic spines findings were suggestive of ankylosing spondylitis.  There is no evidence of interstitial lung disease. He was noted to be hypoxic but improved with oxygen he was discharged without oxygen, when his physician visited him at home he was again found to be hypoxic and since then was started on oxygen. Both daughter -in law & He reported that oxygen has helped him and improved his dyspnea   He denies cough or sputum production.  He reports pedal edema for which he took Lasix about 3 times in the past week. He has used albuterol in the past without significant relief. He denies significant wheezing or childhood history of asthma  Oxygen saturation was 94% on room air but on walking to the bathroom and back he dropped about 81% and improved again with oxygen  Significant tests/  events reviewed  Spirometry shows ratio of 67, FEV1 49% FVC of 48% suggestive of moderate obstruction and restriction  Past Medical History:  Diagnosis Date  . BPH (benign prostatic hypertrophy)   . Chronic heart failure ([place])   . Chronic renal insufficiency   . Coronary artery disease    CABG in 2000  . Diabetes type 2, controlled ([place])    Diet-controlled  . Diabetic neuropathy ([place])   . Dyslipidemia   . Hypertension   . ITP (idiopathic thrombocytopenic purpura)   . Osteoarthritis   . Pneumonia    Past Surgical History:  Procedure Laterality Date  . CHOLECYSTECTOMY    . CORONARY ARTERY BYPASS GRAFT  2000  . Right hip replacement  2014  . TONSILLECTOMY      Allergies  Allergen Reactions  . Alfuzosin Hcl Er Other (See Comments)    Confusion/Uroxatral   . Imdur [Isosorbide Dinitrate] Other (See Comments)    REACTION NOT RECALLED  . Penicillins Hives and Other (See Comments)    Has patient had a PCN reaction causing immediate rash, facial/tongue/throat swelling, SOB or lightheadedness with hypotension: Yes Has patient had a PCN reaction causing severe rash involving mucus membranes or skin necrosis: Unknown Has patient had a PCN reaction that required hospitalization: Unknown Has patient had a PCN reaction occurring within the last 10 years: Unknown If all of the above answers are "NO", then may proceed with Cephalosporin use.   . Tape Other (See Comments)    SKIN IS VERY THIN AND WILL TEAR EASILY     Social  History   Socioeconomic History  . Marital status: Single    Spouse name: Not on file  . Number of children: Not on file  . Years of education: Not on file  . Highest education level: Not on file  Social Needs  . Financial resource strain: Not on file  . Food insecurity - worry: Not on file  . Food insecurity - inability: Not on file  . Transportation needs - medical: Not on file  . Transportation needs - non-medical: Not on file  Occupational History  .  Occupation: Salesman  Tobacco Use  . Smoking status: Former Smoker    Packs/day: 2.00    Years: 25.00    Pack years: 50.00  . Smokeless tobacco: Never Used  Substance and Sexual Activity  . Alcohol use: No  . Drug use: No  . Sexual activity: Not Currently  Other Topics Concern  . Not on file  Social History Narrative    Two children.       Family History  Problem Relation Age of Onset  . Lung disease Father   . Cancer Brother      Review of Systems Positive for shortness of breath with activity, nonproductive cough, acid heartburn, irregular heartbeat, difficulty breathing through his nose, feet swelling and joint stiffness  Constitutional: negative for anorexia, fevers and sweats  Eyes: negative for irritation, redness and visual disturbance  Ears, nose, mouth, throat, and face: negative for earaches, epistaxis, nasal congestion and sore throat  Respiratory: negative for  sputum and wheezing  Cardiovascular: negative for chest pain,  orthopnea, palpitations and syncope  Gastrointestinal: negative for abdominal pain, constipation, diarrhea, melena, nausea and vomiting  Genitourinary:negative for dysuria, frequency and hematuria  Hematologic/lymphatic: negative for bleeding, easy bruising and lymphadenopathy  Musculoskeletal:negative for arthralgias, muscle weakness and stiff joints  Neurological: negative for coordination problems, gait problems, headaches and weakness  Endocrine: negative for diabetic symptoms including polydipsia, polyuria and weight loss     Objective:   Physical Exam  Gen. remainIs elderly, Pleasant, well-nourished, in no distress, normal affect ENT - no lesions, no post nasal drip Neck: No JVD, no thyromegaly, no carotid bruits Lungs: no use of accessory muscles, no dullness to percussion, clear without rales or rhonchi  Cardiovascular: Rhythm regular, heart sounds  normal, no murmurs or gallops, 1+ peripheral edema Abdomen: soft and non-tender,  no hepatosplenomegaly, BS normal. Musculoskeletal: No deformities, no cyanosis or clubbing Neuro:  alert, non focal       Assessment & Plan:

## 2017-05-14 NOTE — Assessment & Plan Note (Signed)
He does qualify for oxygen. Oxygen saturation seems to drop with exertion.  I have asked him to continue on 2 L of oxygen during exertion and sleep. Reason for his hypoxia is unclear and at this time seems to be mostly related to fluid issues with his heart failure.  It is possible that he has underlying chronic obstructive lung disease

## 2017-05-14 NOTE — Patient Instructions (Signed)
Continue on oxygen especially when you walk.    Okay to stay off oxygen for short times when at rest Continue to use albuterol 2 puffs every 6 hours as needed for wheezing or shortness of breath

## 2017-05-14 NOTE — Assessment & Plan Note (Signed)
It is certainly possible based on his extensive history of smoking in the past that he has underlying chronic obstructive lung disease. Albuterol has not really helped him and he does not want me to escalate therapy for this at this time. We will observe him and reassess in 3 months

## 2017-05-15 DIAGNOSIS — J205 Acute bronchitis due to respiratory syncytial virus: Secondary | ICD-10-CM | POA: Diagnosis not present

## 2017-05-15 DIAGNOSIS — M6281 Muscle weakness (generalized): Secondary | ICD-10-CM | POA: Diagnosis not present

## 2017-05-15 DIAGNOSIS — E119 Type 2 diabetes mellitus without complications: Secondary | ICD-10-CM | POA: Diagnosis not present

## 2017-05-15 DIAGNOSIS — I129 Hypertensive chronic kidney disease with stage 1 through stage 4 chronic kidney disease, or unspecified chronic kidney disease: Secondary | ICD-10-CM | POA: Diagnosis not present

## 2017-05-15 DIAGNOSIS — I2510 Atherosclerotic heart disease of native coronary artery without angina pectoris: Secondary | ICD-10-CM | POA: Diagnosis not present

## 2017-05-15 DIAGNOSIS — N183 Chronic kidney disease, stage 3 (moderate): Secondary | ICD-10-CM | POA: Diagnosis not present

## 2017-05-17 ENCOUNTER — Encounter (HOSPITAL_COMMUNITY): Payer: Self-pay

## 2017-05-17 ENCOUNTER — Other Ambulatory Visit: Payer: Self-pay

## 2017-05-17 ENCOUNTER — Emergency Department (HOSPITAL_COMMUNITY): Payer: Medicare Other

## 2017-05-17 ENCOUNTER — Inpatient Hospital Stay (HOSPITAL_COMMUNITY)
Admission: EM | Admit: 2017-05-17 | Discharge: 2017-05-21 | DRG: 291 | Disposition: A | Discharge status: Skilled Nursing Facility | Payer: Medicare Other | Attending: Family Medicine | Admitting: Family Medicine

## 2017-05-17 DIAGNOSIS — H40059 Ocular hypertension, unspecified eye: Secondary | ICD-10-CM | POA: Diagnosis not present

## 2017-05-17 DIAGNOSIS — R262 Difficulty in walking, not elsewhere classified: Secondary | ICD-10-CM | POA: Diagnosis not present

## 2017-05-17 DIAGNOSIS — R531 Weakness: Secondary | ICD-10-CM | POA: Diagnosis present

## 2017-05-17 DIAGNOSIS — I82441 Acute embolism and thrombosis of right tibial vein: Secondary | ICD-10-CM | POA: Diagnosis not present

## 2017-05-17 DIAGNOSIS — N400 Enlarged prostate without lower urinary tract symptoms: Secondary | ICD-10-CM | POA: Diagnosis present

## 2017-05-17 DIAGNOSIS — R42 Dizziness and giddiness: Secondary | ICD-10-CM

## 2017-05-17 DIAGNOSIS — J449 Chronic obstructive pulmonary disease, unspecified: Secondary | ICD-10-CM | POA: Diagnosis present

## 2017-05-17 DIAGNOSIS — J9611 Chronic respiratory failure with hypoxia: Secondary | ICD-10-CM | POA: Diagnosis present

## 2017-05-17 DIAGNOSIS — N179 Acute kidney failure, unspecified: Secondary | ICD-10-CM | POA: Diagnosis present

## 2017-05-17 DIAGNOSIS — I160 Hypertensive urgency: Secondary | ICD-10-CM | POA: Diagnosis not present

## 2017-05-17 DIAGNOSIS — I130 Hypertensive heart and chronic kidney disease with heart failure and stage 1 through stage 4 chronic kidney disease, or unspecified chronic kidney disease: Secondary | ICD-10-CM | POA: Diagnosis not present

## 2017-05-17 DIAGNOSIS — I5033 Acute on chronic diastolic (congestive) heart failure: Secondary | ICD-10-CM | POA: Diagnosis not present

## 2017-05-17 DIAGNOSIS — I248 Other forms of acute ischemic heart disease: Secondary | ICD-10-CM | POA: Diagnosis not present

## 2017-05-17 DIAGNOSIS — E785 Hyperlipidemia, unspecified: Secondary | ICD-10-CM | POA: Diagnosis present

## 2017-05-17 DIAGNOSIS — R0981 Nasal congestion: Secondary | ICD-10-CM | POA: Diagnosis not present

## 2017-05-17 DIAGNOSIS — R079 Chest pain, unspecified: Secondary | ICD-10-CM

## 2017-05-17 DIAGNOSIS — I161 Hypertensive emergency: Secondary | ICD-10-CM | POA: Diagnosis present

## 2017-05-17 DIAGNOSIS — R4182 Altered mental status, unspecified: Secondary | ICD-10-CM | POA: Diagnosis not present

## 2017-05-17 DIAGNOSIS — Z9981 Dependence on supplemental oxygen: Secondary | ICD-10-CM

## 2017-05-17 DIAGNOSIS — Z66 Do not resuscitate: Secondary | ICD-10-CM | POA: Diagnosis present

## 2017-05-17 DIAGNOSIS — E669 Obesity, unspecified: Secondary | ICD-10-CM | POA: Diagnosis present

## 2017-05-17 DIAGNOSIS — Z87891 Personal history of nicotine dependence: Secondary | ICD-10-CM

## 2017-05-17 DIAGNOSIS — R791 Abnormal coagulation profile: Secondary | ICD-10-CM | POA: Diagnosis present

## 2017-05-17 DIAGNOSIS — J181 Lobar pneumonia, unspecified organism: Secondary | ICD-10-CM | POA: Diagnosis not present

## 2017-05-17 DIAGNOSIS — Z96641 Presence of right artificial hip joint: Secondary | ICD-10-CM | POA: Diagnosis not present

## 2017-05-17 DIAGNOSIS — K219 Gastro-esophageal reflux disease without esophagitis: Secondary | ICD-10-CM | POA: Diagnosis present

## 2017-05-17 DIAGNOSIS — I5030 Unspecified diastolic (congestive) heart failure: Secondary | ICD-10-CM | POA: Diagnosis not present

## 2017-05-17 DIAGNOSIS — R110 Nausea: Secondary | ICD-10-CM | POA: Diagnosis not present

## 2017-05-17 DIAGNOSIS — R269 Unspecified abnormalities of gait and mobility: Secondary | ICD-10-CM | POA: Diagnosis present

## 2017-05-17 DIAGNOSIS — N189 Chronic kidney disease, unspecified: Secondary | ICD-10-CM | POA: Diagnosis present

## 2017-05-17 DIAGNOSIS — R0602 Shortness of breath: Secondary | ICD-10-CM | POA: Diagnosis not present

## 2017-05-17 DIAGNOSIS — Z951 Presence of aortocoronary bypass graft: Secondary | ICD-10-CM

## 2017-05-17 DIAGNOSIS — E119 Type 2 diabetes mellitus without complications: Secondary | ICD-10-CM | POA: Diagnosis not present

## 2017-05-17 DIAGNOSIS — D693 Immune thrombocytopenic purpura: Secondary | ICD-10-CM | POA: Diagnosis present

## 2017-05-17 DIAGNOSIS — I129 Hypertensive chronic kidney disease with stage 1 through stage 4 chronic kidney disease, or unspecified chronic kidney disease: Secondary | ICD-10-CM | POA: Diagnosis not present

## 2017-05-17 DIAGNOSIS — M6281 Muscle weakness (generalized): Secondary | ICD-10-CM | POA: Diagnosis not present

## 2017-05-17 DIAGNOSIS — I2510 Atherosclerotic heart disease of native coronary artery without angina pectoris: Secondary | ICD-10-CM | POA: Diagnosis present

## 2017-05-17 DIAGNOSIS — I447 Left bundle-branch block, unspecified: Secondary | ICD-10-CM | POA: Diagnosis present

## 2017-05-17 DIAGNOSIS — E1122 Type 2 diabetes mellitus with diabetic chronic kidney disease: Secondary | ICD-10-CM | POA: Diagnosis present

## 2017-05-17 DIAGNOSIS — F064 Anxiety disorder due to known physiological condition: Secondary | ICD-10-CM | POA: Diagnosis not present

## 2017-05-17 DIAGNOSIS — K5900 Constipation, unspecified: Secondary | ICD-10-CM | POA: Diagnosis not present

## 2017-05-17 DIAGNOSIS — I25810 Atherosclerosis of coronary artery bypass graft(s) without angina pectoris: Secondary | ICD-10-CM | POA: Diagnosis not present

## 2017-05-17 DIAGNOSIS — J205 Acute bronchitis due to respiratory syncytial virus: Secondary | ICD-10-CM | POA: Diagnosis not present

## 2017-05-17 DIAGNOSIS — Z23 Encounter for immunization: Secondary | ICD-10-CM | POA: Diagnosis not present

## 2017-05-17 DIAGNOSIS — I10 Essential (primary) hypertension: Secondary | ICD-10-CM

## 2017-05-17 DIAGNOSIS — J189 Pneumonia, unspecified organism: Secondary | ICD-10-CM

## 2017-05-17 DIAGNOSIS — E1140 Type 2 diabetes mellitus with diabetic neuropathy, unspecified: Secondary | ICD-10-CM | POA: Diagnosis present

## 2017-05-17 DIAGNOSIS — R252 Cramp and spasm: Secondary | ICD-10-CM | POA: Diagnosis present

## 2017-05-17 DIAGNOSIS — J90 Pleural effusion, not elsewhere classified: Secondary | ICD-10-CM | POA: Diagnosis not present

## 2017-05-17 DIAGNOSIS — R7989 Other specified abnormal findings of blood chemistry: Secondary | ICD-10-CM | POA: Diagnosis not present

## 2017-05-17 DIAGNOSIS — I509 Heart failure, unspecified: Secondary | ICD-10-CM | POA: Diagnosis not present

## 2017-05-17 DIAGNOSIS — Z111 Encounter for screening for respiratory tuberculosis: Secondary | ICD-10-CM | POA: Diagnosis not present

## 2017-05-17 DIAGNOSIS — Z6831 Body mass index (BMI) 31.0-31.9, adult: Secondary | ICD-10-CM

## 2017-05-17 DIAGNOSIS — N183 Chronic kidney disease, stage 3 (moderate): Secondary | ICD-10-CM | POA: Diagnosis not present

## 2017-05-17 DIAGNOSIS — R52 Pain, unspecified: Secondary | ICD-10-CM | POA: Diagnosis not present

## 2017-05-17 LAB — CBC WITH DIFFERENTIAL/PLATELET
Basophils Absolute: 0 10*3/uL (ref 0.0–0.1)
Basophils Relative: 0 %
Eosinophils Absolute: 0.1 10*3/uL (ref 0.0–0.7)
Eosinophils Relative: 1 %
HCT: 39.7 % (ref 39.0–52.0)
Hemoglobin: 12.4 g/dL — ABNORMAL LOW (ref 13.0–17.0)
Lymphocytes Relative: 21 %
Lymphs Abs: 1.7 10*3/uL (ref 0.7–4.0)
MCH: 29.2 pg (ref 26.0–34.0)
MCHC: 31.2 g/dL (ref 30.0–36.0)
MCV: 93.4 fL (ref 78.0–100.0)
Monocytes Absolute: 0.7 10*3/uL (ref 0.1–1.0)
Monocytes Relative: 8 %
Neutro Abs: 5.6 10*3/uL (ref 1.7–7.7)
Neutrophils Relative %: 70 %
Platelets: 191 10*3/uL (ref 150–400)
RBC: 4.25 MIL/uL (ref 4.22–5.81)
RDW: 14.3 % (ref 11.5–15.5)
WBC: 8 10*3/uL (ref 4.0–10.5)

## 2017-05-17 LAB — TROPONIN I: Troponin I: 0.03 ng/mL (ref <0.03)

## 2017-05-17 LAB — URINALYSIS, ROUTINE W REFLEX MICROSCOPIC
Bacteria, UA: NONE SEEN
Bilirubin Urine: NEGATIVE
Glucose, UA: NEGATIVE mg/dL
Hgb urine dipstick: NEGATIVE
Ketones, ur: NEGATIVE mg/dL
Leukocytes, UA: NEGATIVE
Nitrite: NEGATIVE
Protein, ur: 30 mg/dL — AB
RBC / HPF: NONE SEEN RBC/hpf (ref 0–5)
Specific Gravity, Urine: 1.009 (ref 1.005–1.030)
Squamous Epithelial / LPF: NONE SEEN
pH: 6 (ref 5.0–8.0)

## 2017-05-17 LAB — COMPREHENSIVE METABOLIC PANEL
ALT: 11 U/L — ABNORMAL LOW (ref 17–63)
AST: 22 U/L (ref 15–41)
Albumin: 3.4 g/dL — ABNORMAL LOW (ref 3.5–5.0)
Alkaline Phosphatase: 63 U/L (ref 38–126)
Anion gap: 9 (ref 5–15)
BUN: 16 mg/dL (ref 6–20)
CO2: 24 mmol/L (ref 22–32)
Calcium: 9 mg/dL (ref 8.9–10.3)
Chloride: 102 mmol/L (ref 101–111)
Creatinine, Ser: 1.14 mg/dL (ref 0.61–1.24)
GFR calc Af Amer: >60 mL/min (ref >60)
GFR calc non Af Amer: 52 mL/min — ABNORMAL LOW (ref >60)
Glucose, Bld: 109 mg/dL — ABNORMAL HIGH (ref 65–99)
Potassium: 4.3 mmol/L (ref 3.5–5.1)
Sodium: 135 mmol/L (ref 135–145)
Total Bilirubin: 0.5 mg/dL (ref 0.3–1.2)
Total Protein: 7.3 g/dL (ref 6.5–8.1)

## 2017-05-17 LAB — D-DIMER, QUANTITATIVE: D-Dimer, Quant: 2.23 ug/mL-FEU — ABNORMAL HIGH (ref 0.00–0.50)

## 2017-05-17 LAB — CBG MONITORING, ED: Glucose-Capillary: 134 mg/dL — ABNORMAL HIGH (ref 65–99)

## 2017-05-17 LAB — I-STAT TROPONIN, ED: Troponin i, poc: 0 ng/mL (ref 0.00–0.08)

## 2017-05-17 MED ORDER — ALFUZOSIN HCL ER 10 MG PO TB24
10.0000 mg | ORAL_TABLET | Freq: Every day | ORAL | Status: DC
  Administered 2017-05-18 – 2017-05-21 (×4): 10 mg via ORAL
  Filled 2017-05-17 (×4): qty 1

## 2017-05-17 MED ORDER — ENOXAPARIN SODIUM 40 MG/0.4ML ~~LOC~~ SOLN
40.0000 mg | SUBCUTANEOUS | Status: DC
  Administered 2017-05-18 – 2017-05-20 (×3): 40 mg via SUBCUTANEOUS
  Filled 2017-05-17 (×3): qty 0.4

## 2017-05-17 MED ORDER — DOXYCYCLINE HYCLATE 100 MG PO TABS
100.0000 mg | ORAL_TABLET | Freq: Once | ORAL | Status: AC
  Administered 2017-05-17: 100 mg via ORAL
  Filled 2017-05-17: qty 1

## 2017-05-17 MED ORDER — DOXYCYCLINE HYCLATE 100 MG PO TABS: 100.0000 mg | ORAL_TABLET | Freq: Two times a day (BID) | ORAL | Status: DC

## 2017-05-17 MED ORDER — HYPROMELLOSE (GONIOSCOPIC) 2.5 % OP SOLN: 1.0000 [drp] | Freq: Three times a day (TID) | OPHTHALMIC | Status: DC | PRN

## 2017-05-17 MED ORDER — ASPIRIN 325 MG PO TABS
325.0000 mg | ORAL_TABLET | Freq: Every day | ORAL | Status: DC
  Administered 2017-05-18 – 2017-05-20 (×4): 325 mg via ORAL
  Filled 2017-05-17 (×4): qty 1

## 2017-05-17 MED ORDER — DM-GUAIFENESIN ER 30-600 MG PO TB12: 1.0000 | ORAL_TABLET | Freq: Two times a day (BID) | ORAL | Status: DC | PRN

## 2017-05-17 MED ORDER — BRIMONIDINE TARTRATE 0.2 % OP SOLN
1.0000 [drp] | Freq: Three times a day (TID) | OPHTHALMIC | Status: DC
  Administered 2017-05-18 – 2017-05-21 (×10): 1 [drp] via OPHTHALMIC
  Filled 2017-05-17: qty 5

## 2017-05-17 MED ORDER — INSULIN ASPART 100 UNIT/ML ~~LOC~~ SOLN: 0.0000 [IU] | Freq: Every day | SUBCUTANEOUS | Status: DC

## 2017-05-17 MED ORDER — FLUOROURACIL 0.5 % EX CREA: 1.0000 "application " | TOPICAL_CREAM | Freq: Every day | CUTANEOUS | Status: DC

## 2017-05-17 MED ORDER — CLOBETASOL PROPIONATE 0.05 % EX CREA: 1.0000 "application " | TOPICAL_CREAM | Freq: Two times a day (BID) | CUTANEOUS | Status: DC | PRN

## 2017-05-17 MED ORDER — FLUTICASONE PROPIONATE 50 MCG/ACT NA SUSP: 2.0000 | Freq: Two times a day (BID) | NASAL | Status: DC | PRN

## 2017-05-17 MED ORDER — MORPHINE SULFATE (PF) 4 MG/ML IV SOLN
1.0000 mg | INTRAVENOUS | Status: DC | PRN
  Administered 2017-05-18: 1 mg via INTRAVENOUS
  Filled 2017-05-17: qty 1

## 2017-05-17 MED ORDER — ONDANSETRON HCL 4 MG/2ML IJ SOLN: 4.0000 mg | Freq: Four times a day (QID) | INTRAMUSCULAR | Status: DC | PRN

## 2017-05-17 MED ORDER — HYDRALAZINE HCL 20 MG/ML IJ SOLN
5.0000 mg | INTRAMUSCULAR | Status: DC | PRN
  Administered 2017-05-17 – 2017-05-18 (×2): 5 mg via INTRAVENOUS
  Filled 2017-05-17 (×2): qty 1

## 2017-05-17 MED ORDER — DOCUSATE SODIUM 100 MG PO CAPS: 100.0000 mg | ORAL_CAPSULE | Freq: Two times a day (BID) | ORAL | Status: DC | PRN

## 2017-05-17 MED ORDER — ADULT MULTIVITAMIN W/MINERALS CH
1.0000 | ORAL_TABLET | Freq: Every day | ORAL | Status: DC
  Administered 2017-05-18 – 2017-05-21 (×4): 1 via ORAL
  Filled 2017-05-17 (×4): qty 1

## 2017-05-17 MED ORDER — ACETAMINOPHEN 325 MG PO TABS: 650.0000 mg | ORAL_TABLET | Freq: Every day | ORAL | Status: DC | PRN

## 2017-05-17 MED ORDER — INSULIN ASPART 100 UNIT/ML ~~LOC~~ SOLN
0.0000 [IU] | Freq: Three times a day (TID) | SUBCUTANEOUS | Status: DC
  Administered 2017-05-18: 2 [IU] via SUBCUTANEOUS
  Administered 2017-05-18 – 2017-05-19 (×3): 1 [IU] via SUBCUTANEOUS
  Administered 2017-05-19: 2 [IU] via SUBCUTANEOUS
  Administered 2017-05-20: 1 [IU] via SUBCUTANEOUS
  Administered 2017-05-20: 2 [IU] via SUBCUTANEOUS
  Administered 2017-05-20: 1 [IU] via SUBCUTANEOUS
  Administered 2017-05-21: 2 [IU] via SUBCUTANEOUS
  Administered 2017-05-21: 1 [IU] via SUBCUTANEOUS

## 2017-05-17 MED ORDER — ZOLPIDEM TARTRATE 5 MG PO TABS: 5.0000 mg | ORAL_TABLET | Freq: Every evening | ORAL | Status: DC | PRN

## 2017-05-17 MED ORDER — ALBUTEROL SULFATE (2.5 MG/3ML) 0.083% IN NEBU: 2.5000 mg | INHALATION_SOLUTION | RESPIRATORY_TRACT | Status: DC | PRN

## 2017-05-17 MED ORDER — PANTOPRAZOLE SODIUM 40 MG PO TBEC
40.0000 mg | DELAYED_RELEASE_TABLET | Freq: Every day | ORAL | Status: DC
  Administered 2017-05-18 – 2017-05-21 (×4): 40 mg via ORAL
  Filled 2017-05-17 (×4): qty 1

## 2017-05-17 MED ORDER — NITROGLYCERIN IN D5W 200-5 MCG/ML-% IV SOLN
0.0000 ug/min | INTRAVENOUS | Status: DC
  Administered 2017-05-17: 5 ug/min via INTRAVENOUS
  Filled 2017-05-17: qty 250

## 2017-05-17 MED ORDER — LATANOPROST 0.005 % OP SOLN
1.0000 [drp] | Freq: Every day | OPHTHALMIC | Status: DC
  Administered 2017-05-18 – 2017-05-20 (×3): 1 [drp] via OPHTHALMIC
  Filled 2017-05-17: qty 2.5

## 2017-05-17 MED ORDER — SIMVASTATIN 20 MG PO TABS
20.0000 mg | ORAL_TABLET | Freq: Every day | ORAL | Status: DC
  Administered 2017-05-18 – 2017-05-20 (×3): 20 mg via ORAL
  Filled 2017-05-17 (×3): qty 1

## 2017-05-17 MED ORDER — POLYVINYL ALCOHOL 1.4 % OP SOLN
1.0000 [drp] | Freq: Two times a day (BID) | OPHTHALMIC | Status: DC
  Administered 2017-05-18 – 2017-05-20 (×5): 1 [drp] via OPHTHALMIC
  Filled 2017-05-17: qty 15

## 2017-05-17 MED ORDER — LOSARTAN POTASSIUM 50 MG PO TABS
100.0000 mg | ORAL_TABLET | Freq: Every day | ORAL | Status: DC
  Administered 2017-05-18: 100 mg via ORAL
  Filled 2017-05-17: qty 2

## 2017-05-17 MED ORDER — HYDRALAZINE HCL 20 MG/ML IJ SOLN: 10.0000 mg | Freq: Once | INTRAMUSCULAR | Status: DC

## 2017-05-17 MED ORDER — SODIUM CHLORIDE 0.9 % IV SOLN: 1.0000 g | INTRAVENOUS | Status: DC

## 2017-05-17 MED ORDER — SODIUM CHLORIDE 0.9 % IV SOLN
1.0000 g | Freq: Once | INTRAVENOUS | Status: AC
  Administered 2017-05-17: 1 g via INTRAVENOUS
  Filled 2017-05-17: qty 10

## 2017-05-17 MED ORDER — CYCLOSPORINE 0.05 % OP EMUL
1.0000 [drp] | Freq: Two times a day (BID) | OPHTHALMIC | Status: DC
  Administered 2017-05-18 – 2017-05-21 (×7): 1 [drp] via OPHTHALMIC
  Filled 2017-05-17 (×8): qty 1

## 2017-05-17 MED ORDER — NITROGLYCERIN 0.4 MG SL SUBL: 0.4000 mg | SUBLINGUAL_TABLET | SUBLINGUAL | Status: DC | PRN

## 2017-05-17 MED ORDER — MAGNESIUM OXIDE 400 (241.3 MG) MG PO TABS
400.0000 mg | ORAL_TABLET | Freq: Every day | ORAL | Status: DC
  Administered 2017-05-18 – 2017-05-21 (×4): 400 mg via ORAL
  Filled 2017-05-17 (×4): qty 1

## 2017-05-17 NOTE — ED Notes (Signed)
Pt upset because he called and no one came for approx 30 minutes  Everyone tied up  With critical patients from [facility]

## 2017-05-17 NOTE — ED Notes (Signed)
ED Provider at bedside. 

## 2017-05-17 NOTE — H&P (Signed)
History and Physical    Lawrence Warner TOI:[id number] DOB: [birth year] DOA: 05/17/2017  Referring MD/NP/PA:   PCP: Clovia Cuff, MD   Patient coming from:  The patient is coming from home.  At baseline, pt is independent for most of ADL.   Chief Complaint: generalized weakness, lightheadedness, chest pain, shortness breath  HPI: Lawrence Warner is a 84 y.o. male with medical history significant of Hypertension, hyperlipidemia, diabetes mellitus, COPD, on 2 L nasal cannula oxygen, GERD, thrombocytopenia, CAD, CABG, dCHF, BPH, CKD-3, gait instability, who presents with generalized weakness, lightheadedness, chest pain and SOB.  Per pt's son, pt has bee feeling weak recently, which has been progressively getting worse. He has dizziness and lightheadedness. He has generalized weakness, but no unilateral numbness or tingliness in extremities, no facial droop, slurred speech, vision change or hearing loss. Patient has a chest pain, which is located in left side of chest, sharp, 8 out of 10 in severity, nonradiating, pleuritic, aggravated by deep breath. No tenderness of the calf areas. Patient denies nausea, vomiting, diarrhea, abdominal pain, symptoms of UTI. Patient reports neck pain recently.   ED Course: pt was found to have WBC 8.0, troponin 0.03, BNP 1129, positive D-dimer 2.23, negative urinalysis, stable renal function, no tachypnea, Bp 215/113, oxygen saturation 95% on 2 L nasal cannula oxygen, temperature normal. CT head is negative for acute intracranial abnormalities.  CXR:  1. Interval mild patchy opacity at both lung bases 2. Mild increase in size of a small right pleural effusion with a probable interval small left pleural effusion. 3. Stable cardiomegaly and mild chronic interstitial lung disease.  CT-head and C-spin: 1. No acute intracranial abnormality. 2. No cervical spine fracture or subluxation. 3. No significant change in diffuse cerebral and cerebellar atrophy. 4.  Extensive changes of DISH throughout the cervical upper thoracic spine with anterior and posterior bone fusion and associated moderate-to-marked canal stenosis on the left at the C2-3 level. 5. Bilateral carotid artery atheromatous calcifications. 6. Marked atlantoaxial degenerative changes. 7. Stable bilateral loculated pleural fluid/thickening.  Review of Systems:   General: no fevers, chills, has poor appetite, has fatigue HEENT: no blurry vision, hearing changes or sore throat Respiratory: has dyspnea, coughing, no wheezing CV: has chest pain, no palpitations GI: no nausea, vomiting, abdominal pain, diarrhea, constipation GU: no dysuria, burning on urination, increased urinary frequency, hematuria  Ext: has leg edema Neuro: no unilateral weakness, numbness, or tingling, no vision change or hearing loss. Has dizziness and Lightheadedness. Skin: no rash, no skin tear. MSK: No muscle spasm, no deformity, no limitation of range of movement in spin Heme: No easy bruising.  Travel history: No recent long distant travel.  Allergy:  Allergies  Allergen Reactions  . Alfuzosin Hcl Er Other (See Comments)    Confusion/Uroxatral   . Imdur [Isosorbide Dinitrate] Other (See Comments)    REACTION NOT RECALLED  . Penicillins Hives and Other (See Comments)    Has patient had a PCN reaction causing immediate rash, facial/tongue/throat swelling, SOB or lightheadedness with hypotension: Yes Has patient had a PCN reaction causing severe rash involving mucus membranes or skin necrosis: Unknown Has patient had a PCN reaction that required hospitalization: Unknown Has patient had a PCN reaction occurring within the last 10 years: Unknown If all of the above answers are "NO", then may proceed with Cephalosporin use.   . Tape Other (See Comments)    SKIN IS VERY THIN AND WILL TEAR EASILY    Past Medical History:  Diagnosis Date  .  BPH (benign prostatic hypertrophy)   . Chronic heart failure ([place])    . Chronic renal insufficiency   . Coronary artery disease    CABG in 2000  . Diabetes type 2, controlled ([place])    Diet-controlled  . Diabetic neuropathy ([place])   . Dyslipidemia   . Hypertension   . ITP (idiopathic thrombocytopenic purpura)   . Osteoarthritis   . Pneumonia     Past Surgical History:  Procedure Laterality Date  . CHOLECYSTECTOMY    . CORONARY ARTERY BYPASS GRAFT  2000  . Right hip replacement  2014  . TONSILLECTOMY      Social History:  reports that he has quit smoking. He has a 50.00 pack-year smoking history. He has never used smokeless tobacco. He reports that he does not drink alcohol or use drugs.  Family History:  Family History  Problem Relation Age of Onset  . Lung disease Father   . Cancer Brother      Prior to Admission medications   Medication Sig Start Date End Date Taking? Authorizing Provider  acetaminophen (TYLENOL) 500 MG tablet Take 500-1,000 mg by mouth daily as needed for mild pain or headache.     [provider]  albuterol (PROAIR HFA) 108 (90 Base) MCG/ACT inhaler Inhale 1 puff into the lungs every 6 (six) hours as needed for wheezing.     [provider]  alfuzosin (UROXATRAL) 10 MG 24 hr tablet Take 10 mg by mouth daily with breakfast.    [provider]  brimonidine (ALPHAGAN) 0.2 % ophthalmic solution Place 1 drop into both eyes 3 (three) times daily.    [provider]  clobetasol cream (TEMOVATE) 6.21 % Apply 1 application topically 2 (two) times daily. To scalp    [provider]  cycloSPORINE (RESTASIS) 0.05 % ophthalmic emulsion Place 1 drop into both eyes every 12 (twelve) hours.     [provider]  Dextran 70-Hypromellose (ARTIFICIAL TEARS PF OP) Place 1 drop into both eyes 4 (four) times daily.     [provider]  docusate sodium (COLACE) 100 MG capsule Take 1 capsule (100 mg total) by mouth 2 (two) times daily as needed for mild constipation. 03/19/17   Rosita Fire, MD  fluoruracil [facility]) 0.5 % cream Apply 1 application topically at bedtime.    [provider]  fluticasone (FLONASE) 50 MCG/ACT nasal spray Place 2 sprays into both nostrils 2 (two) times daily. For 7days 07/26/16   Domenic Polite, MD  hydroxypropyl methylcellulose / hypromellose (ISOPTO TEARS / GONIOVISC) 2.5 % ophthalmic solution Place 1 drop into both eyes 3 (three) times daily as needed for dry eyes.    [provider]  latanoprost (XALATAN) 0.005 % ophthalmic solution Place 1 drop into the left eye at bedtime.    [provider]  losartan (COZAAR) 100 MG tablet Take 100 mg by mouth daily.    [provider]  magnesium oxide (MAG-OX) 400 MG tablet Take 400 mg daily by mouth.    [provider]  Multiple Vitamin (MULTIVITAMIN WITH MINERALS) TABS tablet Take 1 tablet by mouth daily.    [provider]  nitroGLYCERIN (NITROSTAT) 0.4 MG SL tablet Place 0.4 mg under the tongue every 5 (five) minutes as needed for chest pain.    [provider]  omeprazole (PRILOSEC) 20 MG capsule Take 1 capsule (20 mg total) by mouth daily before breakfast. 03/19/17   Rosita Fire, MD  predniSONE (DELTASONE) 10 MG tablet  Take 1 tablet (10 mg total) by mouth daily. 3 tabs for 2 days, 2 tabs for 2 days and 1 tab for 2 days and then stop. 03/19/17   Rosita Fire, MD  simvastatin (ZOCOR) 20 MG tablet Take 20 mg by mouth daily.    [provider]    Physical Exam: Vitals:   05/18/17 0043 05/18/17 0117 05/18/17 0313 05/18/17 0418  BP: (!) 192/84 (!) 172/69 (!) 166/58 (!) 159/79  Pulse: 94 67 80   Resp: (!) 32 (!) 25 (!) 23   Temp: 98.4 F (36.9 C)   98.7 F (37.1 C)  TempSrc: Oral   Oral  SpO2: 97% 97% 93%   Weight: 90.1 kg (198 lb 10.2 oz)     Height:       General: Not in acute distress HEENT:       Eyes: PERRL, EOMI, no scleral icterus.       ENT: No discharge from the ears and nose, no pharynx injection,  no tonsillar enlargement.        Neck: positive JVD, no bruit, no mass felt. Heme: No neck lymph node enlargement. Cardiac: S1/S2, RRR, No murmurs, No gallops or rubs. Respiratory: No rales, wheezing, rhonchi or rubs. GI: Soft, nondistended, nontender, no rebound pain, no organomegaly, BS present. GU: No hematuria Ext: 1+ pitting leg edema bilaterally. 2+DP/PT pulse bilaterally. Musculoskeletal: No joint deformities, No joint redness or warmth, no limitation of ROM in spin. Skin: No rashes.  Neuro: Alert, oriented X3, cranial nerves II-XII grossly intact, moves all extremities normally.  Psych: Patient is not psychotic, no suicidal or hemocidal ideation.  Labs on Admission: I have personally reviewed following labs and imaging studies  CBC: Recent Labs  Lab 05/13/17 0930 05/17/17 1702  WBC 6.3 8.0  NEUTROABS 4.5 5.6  HGB 11.7* 12.4*  HCT 36.9* 39.7  MCV 92.9 93.4  PLT 226 742   Basic Metabolic Panel: Recent Labs  Lab 05/17/17 1702  NA 135  K 4.3  CL 102  CO2 24  GLUCOSE 109*  BUN 16  CREATININE 1.14  CALCIUM 9.0   GFR: Estimated Creatinine Clearance: 40.6 mL/min (by C-G formula based on SCr of 1.14 mg/dL). Liver Function Tests: Recent Labs  Lab 05/17/17 1702  AST 22  ALT 11*  ALKPHOS 63  BILITOT 0.5  PROT 7.3  ALBUMIN 3.4*   No results for input(s): LIPASE, AMYLASE in the last 168 hours. No results for input(s): AMMONIA in the last 168 hours. Coagulation Profile: No results for input(s): INR, PROTIME in the last 168 hours. Cardiac Enzymes: Recent Labs  Lab 05/17/17 2001 05/18/17 0217  TROPONINI 0.03* 0.04*   BNP (last 3 results) No results for input(s): PROBNP in the last 8760 hours. HbA1C: Recent Labs    05/18/17 0217  HGBA1C 6.7*   CBG: Recent Labs  Lab 05/17/17 2313  GLUCAP 134*   Lipid Profile: Recent Labs    05/18/17 0217  CHOL 92  HDL 25*  LDLCALC 47  TRIG 101  CHOLHDL 3.7   Thyroid Function Tests: No results for input(s):  TSH, T4TOTAL, FREET4, T3FREE, THYROIDAB in the last 72 hours. Anemia Panel: No results for input(s): VITAMINB12, FOLATE, FERRITIN, TIBC, IRON, RETICCTPCT in the last 72 hours. Urine analysis:    Component Value Date/Time   COLORURINE STRAW (A) 05/17/2017 1532   APPEARANCEUR CLEAR 05/17/2017 1532   LABSPEC 1.009 05/17/2017 1532   PHURINE 6.0 05/17/2017 1532   GLUCOSEU NEGATIVE 05/17/2017 1532   HGBUR NEGATIVE  05/17/2017 [place] 05/17/2017 1532   [place] 05/17/2017 1532   PROTEINUR 30 (A) 05/17/2017 1532   NITRITE NEGATIVE 05/17/2017 1532   LEUKOCYTESUR NEGATIVE 05/17/2017 1532   Sepsis Labs: @LABRCNTIP (procalcitonin:4,lacticidven:4) ) Recent Results (from the past 240 hour(s))  MRSA PCR Screening     Status: None   Collection Time: 05/18/17  1:03 AM  Result Value Ref Range Status   MRSA by PCR NEGATIVE NEGATIVE Final    Comment:        The GeneXpert MRSA Assay (FDA approved for NASAL specimens only), is one component of a comprehensive MRSA colonization surveillance program. It is not intended to diagnose MRSA infection nor to guide or monitor treatment for MRSA infections. Performed at [facility], [place] [address], [place], [place] [address]      Radiological Exams on Admission: Dg Chest 2 View  Result Date: 05/17/2017 CLINICAL DATA:  Generalized weakness and neck pain for 1 day. EXAM: CHEST - 2 VIEW COMPARISON:  04/30/2017. FINDINGS: Stable enlarged cardiac silhouette and post CABG changes. Interval mild patchy opacity at both lung bases. Small right pleural effusion, mildly increased. Suggestion of a small left pleural effusions superimposed on the previously demonstrated chronic pleural thickening. The interstitial markings remain mildly prominent. Thoracic spine degenerative changes. IMPRESSION: 1. Interval mild patchy opacity at both lung bases, suspicious for pneumonia. 2. Mild increase in size of a small right pleural effusion  with a probable interval small left pleural effusion. 3. Stable cardiomegaly and mild chronic interstitial lung disease. Electronically Signed   By: Claudie Revering M.D.   On: 05/17/2017 16:49   Ct Head Wo Contrast  Result Date: 05/17/2017 CLINICAL DATA:  Generalized weakness. Altered mental status since yesterday. EXAM: CT HEAD WITHOUT CONTRAST CT CERVICAL SPINE WITHOUT CONTRAST TECHNIQUE: Multidetector CT imaging of the head and cervical spine was performed following the standard protocol without intravenous contrast. Multiplanar CT image reconstructions of the cervical spine were also generated. COMPARISON:  Head CT dated 07/20/2016.  Chest CTA dated 04/30/2017. FINDINGS: CT HEAD FINDINGS Brain: Diffusely enlarged ventricles and subarachnoid spaces. No intracranial hemorrhage, mass lesion or CT evidence of acute infarction. Vascular: No hyperdense vessel or unexpected calcification. Skull: Normal. Negative for fracture or focal lesion. Sinuses/Orbits: Status post bilateral cataract extraction. Stable right ethmoid sinus retention cyst. Other: Deviation of the mid to anterior portion of the nasal septum to the right. CT CERVICAL SPINE FINDINGS Alignment: Normal. Skull base and vertebrae: No acute fracture. No primary bone lesion or focal pathologic process. Soft tissues and spinal canal: No prevertebral soft tissue swelling. Moderate-to-marked canal stenosis and moderate foraminal stenosis on the left at the C2-3 level due to posterior longitudinal ligament ossification and bridging osteophytes. Disc levels: Marked changes of DISH with diffuse anterior fusion throughout the cervical and upper thoracic spine. Extensive atlantoaxial degenerative changes with sclerosis. Facet degenerative changes throughout the cervical spine. There is also posterior longitudinal ligament ossification and bone fusion, most pronounced at the C2-3 level, causing moderate to marked canal stenosis and moderate foraminal stenosis on the  left. Upper chest: Previously demonstrated bilateral loculated pleural fluid/thickening. Other: Bilateral carotid artery calcification. IMPRESSION: 1. No acute intracranial abnormality. 2. No cervical spine fracture or subluxation. 3. No significant change in diffuse cerebral and cerebellar atrophy. 4. Extensive changes of DISH throughout the cervical upper thoracic spine with anterior and posterior bone fusion and associated moderate-to-marked canal stenosis on the left at the C2-3 level. 5. Bilateral carotid artery atheromatous calcifications. 6.  Marked atlantoaxial degenerative changes. 7. Stable bilateral loculated pleural fluid/thickening. Electronically Signed   By: Claudie Revering M.D.   On: 05/17/2017 16:59   Ct Cervical Spine Wo Contrast  Result Date: 05/17/2017 CLINICAL DATA:  Generalized weakness. Altered mental status since yesterday. EXAM: CT HEAD WITHOUT CONTRAST CT CERVICAL SPINE WITHOUT CONTRAST TECHNIQUE: Multidetector CT imaging of the head and cervical spine was performed following the standard protocol without intravenous contrast. Multiplanar CT image reconstructions of the cervical spine were also generated. COMPARISON:  Head CT dated 07/20/2016.  Chest CTA dated 04/30/2017. FINDINGS: CT HEAD FINDINGS Brain: Diffusely enlarged ventricles and subarachnoid spaces. No intracranial hemorrhage, mass lesion or CT evidence of acute infarction. Vascular: No hyperdense vessel or unexpected calcification. Skull: Normal. Negative for fracture or focal lesion. Sinuses/Orbits: Status post bilateral cataract extraction. Stable right ethmoid sinus retention cyst. Other: Deviation of the mid to anterior portion of the nasal septum to the right. CT CERVICAL SPINE FINDINGS Alignment: Normal. Skull base and vertebrae: No acute fracture. No primary bone lesion or focal pathologic process. Soft tissues and spinal canal: No prevertebral soft tissue swelling. Moderate-to-marked canal stenosis and moderate foraminal  stenosis on the left at the C2-3 level due to posterior longitudinal ligament ossification and bridging osteophytes. Disc levels: Marked changes of DISH with diffuse anterior fusion throughout the cervical and upper thoracic spine. Extensive atlantoaxial degenerative changes with sclerosis. Facet degenerative changes throughout the cervical spine. There is also posterior longitudinal ligament ossification and bone fusion, most pronounced at the C2-3 level, causing moderate to marked canal stenosis and moderate foraminal stenosis on the left. Upper chest: Previously demonstrated bilateral loculated pleural fluid/thickening. Other: Bilateral carotid artery calcification. IMPRESSION: 1. No acute intracranial abnormality. 2. No cervical spine fracture or subluxation. 3. No significant change in diffuse cerebral and cerebellar atrophy. 4. Extensive changes of DISH throughout the cervical upper thoracic spine with anterior and posterior bone fusion and associated moderate-to-marked canal stenosis on the left at the C2-3 level. 5. Bilateral carotid artery atheromatous calcifications. 6. Marked atlantoaxial degenerative changes. 7. Stable bilateral loculated pleural fluid/thickening. Electronically Signed   By: Claudie Revering M.D.   On: 05/17/2017 16:59     EKG: Independently reviewed. Sinus rhythm, QTC 477, old left bundle blockage, LAE, LAD, poor R-wave progression   Assessment/Plan Principal Problem:   Hypertensive emergency Active Problems:   Diet-controlled diabetes mellitus (HCC)   Hx of CABG   Essential hypertension   Chest pain   GERD (gastroesophageal reflux disease)   Chronic respiratory failure with hypoxia (HCC)   Dyslipidemia   Acute on chronic diastolic CHF (congestive heart failure) (HCC)   CRI (chronic renal insufficiency), stage 3 (moderate) (HCC)   Generalized weakness   Hypertensive emergency and acute on chronic diastolic CHF:  Bp is 604/540. He has elevated BNP 1129, 1+ leg edema,  positive JVD, clinically consistent with CHF exacerbation. 2-D echo on 07/22/16 showed EF 60-65%. Chest x-ray showed bilateral basilar.. Obesity, the patient does not have fever or leukocytosis, clinically does not seem to have pneumonia. He was given one dose of Rocephin and doxycycline, which will be discontinued.  -will admit to [facility] as inpt -start NTG gtt -start IV lasix 40 mg bid -get 2d echo.  Chest pain and hx of CAD: s/p of CABG. trop 0.03. Possibly due to demand ischemia secondary to hypertensive emergency and CHF exacerbation, but his chest pain is pleuritic, d-dimer positive, will need to rule out PE.  - cycle CE q6 x3 and repeat EKG in  the am  - prn Nitroglycerin when NGT is off - prn Morphine, and aspirin, zocor - Risk factor stratification: will check FLP and A1C  - 2d echo - get CTA to r/o PE - LE doppler to r/o DVT  Diet-controlled diabetes mellitus ([place]): Last A1c 8.3 on 07/28/16, poorly controled. Patient is not  taking meds at home. CBG 109 -SSI  GERD: -Protonix  Chronic respiratory failure with hypoxia ([place]): -Nasal cannula oxygen to maitain O2 sat<93  HLD: -zocor  CKD-III: stable. Cre 1.14 - Follow up renal function by BMP  Generalized weakness: multifactorial etiology as above. CT head is negative. No focal neurological findings on physical examination. -PT/OT  Neck pain: C-spin CT showed extensive changes of DISH throughout the cervical upper thoracic spine with anterior and posterior bone fusion and associated moderate-to-marked canal stenosis on the left at the C2-3 level and marked atlantoaxial degenerative changes. -prn percocet and tylenol   DVT ppx: SQ Lovenox Code Status: DNR (I discussed with patient in the presence of his son, and explained the meaning of CODE STATUS. Patient wants to be DNR) Family Communication:  Yes, patient's  son  at bed side Disposition Plan:  Anticipate discharge back to previous home environment Consults called:   none Admission status:  SDU/inpation       Date of Service 05/18/2017    Ivor Costa [facility] Pager [phone]  If 7PM-7AM, please contact night-coverage [email or web address] Password [facility] 05/18/2017, 5:04 AM

## 2017-05-17 NOTE — ED Notes (Signed)
Report given to rn on 6e 

## 2017-05-17 NOTE — ED Provider Notes (Signed)
[facility] EMERGENCY DEPARTMENT Provider Note   CSN: [id number] Arrival date & time: 05/17/17  1513     History   Chief Complaint Chief Complaint  Patient presents with  . Weakness    HPI Lawrence Warner is a 84 y.o. male.  HPI Patient presents to the emergency department with weakness and dizziness with lightheadedness.  The patient is a very vague historian.  He states that he has had this discomfort in his lower chest for the last couple weeks.  He is also been coughing.  Patient states that he was seen by his pulmonologist on Wednesday.  He states he was in the emergency department several weeks ago for similar symptoms with the chest discomfort.  The patient states that he is on home oxygen 2 L.  He states that these episodes of dizziness have been getting worse over the last couple of weeks.  He states they happen 3-4 times a day.  Patient states he did not take any medications prior to arrival.  The patient denies chest pain, shortness of breath, headache,blurred vision, fever,  weakness, numbness,  anorexia, edema, abdominal pain, nausea, vomiting, diarrhea, rash, back pain, dysuria, hematemesis, bloody stool, near syncope, or syncope. Past Medical History:  Diagnosis Date  . BPH (benign prostatic hypertrophy)   . Chronic heart failure ([place])   . Chronic renal insufficiency   . Coronary artery disease    CABG in 2000  . Diabetes type 2, controlled ([place])    Diet-controlled  . Diabetic neuropathy ([place])   . Dyslipidemia   . Hypertension   . ITP (idiopathic thrombocytopenic purpura)   . Osteoarthritis   . Pneumonia     Patient Active Problem List   Diagnosis Date Noted  . Generalized weakness 05/17/2017  . Hypertensive urgency 05/17/2017  . RSV bronchitis   . Bronchospasm with bronchitis, acute 03/14/2017  . Diastolic congestive heart failure ([place]) 11/26/2016  . CRI (chronic renal insufficiency), stage 3 (moderate) ([place]) 11/26/2016  . LBBB (left  bundle branch block) 11/26/2016  . Hypoxia   . Chronic respiratory failure with hypoxia ([place]) 02/28/2016  . Dyslipidemia 02/28/2016  . Constipation 12/07/2015  . Bradycardia 11/28/2015  . Port catheter in place 10/24/2015  . GERD (gastroesophageal reflux disease) 10/09/2015  . Gait instability 10/09/2015  . Chest pain 10/05/2015  . Diet-controlled diabetes mellitus ([place]) 05/07/2015  . Hx of CABG 05/07/2015  . Essential hypertension 05/07/2015  . Muscle cramps 04/25/2015  . Shortness of breath 04/11/2015  . Idiopathic thrombocytopenic purpura ([place]) 09/27/2014    Past Surgical History:  Procedure Laterality Date  . CHOLECYSTECTOMY    . CORONARY ARTERY BYPASS GRAFT  2000  . Right hip replacement  2014  . TONSILLECTOMY          Home Medications    Prior to Admission medications   Medication Sig Start Date End Date Taking? Authorizing Provider  acetaminophen (TYLENOL) 500 MG tablet Take 500-1,000 mg by mouth daily as needed for mild pain or headache.     [provider]  albuterol (PROAIR HFA) 108 (90 Base) MCG/ACT inhaler Inhale 1 puff into the lungs every 6 (six) hours as needed for wheezing.     [provider]  alfuzosin (UROXATRAL) 10 MG 24 hr tablet Take 10 mg by mouth daily with breakfast.    [provider]  brimonidine (ALPHAGAN) 0.2 % ophthalmic solution Place 1 drop into both eyes 3 (three) times daily.    [provider]  clobetasol cream (  TEMOVATE) 2.29 % Apply 1 application topically 2 (two) times daily. To scalp    [provider]  cycloSPORINE (RESTASIS) 0.05 % ophthalmic emulsion Place 1 drop into both eyes every 12 (twelve) hours.     [provider]  Dextran 70-Hypromellose (ARTIFICIAL TEARS PF OP) Place 1 drop into both eyes 4 (four) times daily.     [provider]  docusate sodium (COLACE) 100 MG capsule Take 1 capsule (100 mg total) by mouth 2 (two) times daily as needed for mild constipation.  03/19/17   Rosita Fire, MD  fluoruracil [facility]) 0.5 % cream Apply 1 application topically at bedtime.    [provider]  fluticasone (FLONASE) 50 MCG/ACT nasal spray Place 2 sprays into both nostrils 2 (two) times daily. For 7days 07/26/16   Domenic Polite, MD  hydroxypropyl methylcellulose / hypromellose (ISOPTO TEARS / GONIOVISC) 2.5 % ophthalmic solution Place 1 drop into both eyes 3 (three) times daily as needed for dry eyes.    [provider]  latanoprost (XALATAN) 0.005 % ophthalmic solution Place 1 drop into the left eye at bedtime.    [provider]  losartan (COZAAR) 100 MG tablet Take 100 mg by mouth daily.    [provider]  magnesium oxide (MAG-OX) 400 MG tablet Take 400 mg daily by mouth.    [provider]  Multiple Vitamin (MULTIVITAMIN WITH MINERALS) TABS tablet Take 1 tablet by mouth daily.    [provider]  nitroGLYCERIN (NITROSTAT) 0.4 MG SL tablet Place 0.4 mg under the tongue every 5 (five) minutes as needed for chest pain.    [provider]  omeprazole (PRILOSEC) 20 MG capsule Take 1 capsule (20 mg total) by mouth daily before breakfast. 03/19/17   Rosita Fire, MD  predniSONE (DELTASONE) 10 MG tablet Take 1 tablet (10 mg total) by mouth daily. 3 tabs for 2 days, 2 tabs for 2 days and 1 tab for 2 days and then stop. 03/19/17   Rosita Fire, MD  simvastatin (ZOCOR) 20 MG tablet Take 20 mg by mouth daily.    [provider]    Family History Family History  Problem Relation Age of Onset  . Lung disease Father   . Cancer Brother     Social History Social History   Tobacco Use  . Smoking status: Former Smoker    Packs/day: 2.00    Years: 25.00    Pack years: 50.00  . Smokeless tobacco: Never Used  Substance Use Topics  . Alcohol use: No  . Drug use: No     Allergies   Alfuzosin hcl er; Imdur [isosorbide dinitrate]; Penicillins; and Tape   Review of  Systems Review of Systems  All other systems negative except as documented in the HPI. All pertinent positives and negatives as reviewed in the HPI. Physical Exam Updated Vital Signs BP (!) 190/57   Pulse 73   Temp 97.8 F (36.6 C) (Oral)   Resp 13   Ht 5\' 7"  (1.702 m)   Wt 90.7 kg (200 lb)   SpO2 95%   BMI 31.32 kg/m   Physical Exam  Constitutional: He is oriented to person, place, and time. He appears well-developed and well-nourished. No distress.  HENT:  Head: Normocephalic and atraumatic.  Mouth/Throat: Oropharynx is clear and moist.  Eyes: Pupils are equal, round, and reactive to light.  Neck: Normal range of motion. Neck supple.  Cardiovascular: Normal rate, regular rhythm and normal heart sounds. Exam  reveals no gallop and no friction rub.  No murmur heard. Pulmonary/Chest: Effort normal and breath sounds normal. No respiratory distress. He has no wheezes.  Abdominal: Soft. Bowel sounds are normal. He exhibits no distension. There is no tenderness.  Neurological: He is alert and oriented to person, place, and time. He exhibits normal muscle tone. Coordination normal.  Skin: Skin is warm and dry. Capillary refill takes less than 2 seconds. No rash noted. No erythema.  Psychiatric: He has a normal mood and affect. His behavior is normal.  Nursing note and vitals reviewed.    ED Treatments / Results  Labs (all labs ordered are listed, but only abnormal results are displayed) Labs Reviewed  COMPREHENSIVE METABOLIC PANEL - Abnormal; Notable for the following components:      Result Value   Glucose, Bld 109 (*)    Albumin 3.4 (*)    ALT 11 (*)    GFR calc non Af Amer 52 (*)    All other components within normal limits  CBC WITH DIFFERENTIAL/PLATELET - Abnormal; Notable for the following components:   Hemoglobin 12.4 (*)    All other components within normal limits  URINALYSIS, ROUTINE W REFLEX MICROSCOPIC - Abnormal; Notable for the following components:   Color,  Urine STRAW (*)    Protein, ur 30 (*)    All other components within normal limits  I-STAT TROPONIN, ED    EKG EKG Interpretation  Date/Time:  Saturday May 17 2017 15:30:11 EDT Ventricular Rate:  76 PR Interval:    QRS Duration: 161 QT Interval:  424 QTC Calculation: 477 R Axis:   -55 Text Interpretation:  Sinus rhythm Atrial premature complexes Prolonged PR interval Probable left atrial enlargement Left bundle branch block No significant change since last tracing Confirmed by Isla Pence [phone]) on 05/17/2017 3:34:16 PM Also confirmed by Isla Pence [phone]), editor Abelardo Diesel [phone])  on 05/17/2017 4:29:03 PM   Radiology Dg Chest 2 View  Result Date: 05/17/2017 CLINICAL DATA:  Generalized weakness and neck pain for 1 day. EXAM: CHEST - 2 VIEW COMPARISON:  04/30/2017. FINDINGS: Stable enlarged cardiac silhouette and post CABG changes. Interval mild patchy opacity at both lung bases. Small right pleural effusion, mildly increased. Suggestion of a small left pleural effusions superimposed on the previously demonstrated chronic pleural thickening. The interstitial markings remain mildly prominent. Thoracic spine degenerative changes. IMPRESSION: 1. Interval mild patchy opacity at both lung bases, suspicious for pneumonia. 2. Mild increase in size of a small right pleural effusion with a probable interval small left pleural effusion. 3. Stable cardiomegaly and mild chronic interstitial lung disease. Electronically Signed   By: Claudie Revering M.D.   On: 05/17/2017 16:49   Ct Head Wo Contrast  Result Date: 05/17/2017 CLINICAL DATA:  Generalized weakness. Altered mental status since yesterday. EXAM: CT HEAD WITHOUT CONTRAST CT CERVICAL SPINE WITHOUT CONTRAST TECHNIQUE: Multidetector CT imaging of the head and cervical spine was performed following the standard protocol without intravenous contrast. Multiplanar CT image reconstructions of the cervical spine were also generated.  COMPARISON:  Head CT dated 07/20/2016.  Chest CTA dated 04/30/2017. FINDINGS: CT HEAD FINDINGS Brain: Diffusely enlarged ventricles and subarachnoid spaces. No intracranial hemorrhage, mass lesion or CT evidence of acute infarction. Vascular: No hyperdense vessel or unexpected calcification. Skull: Normal. Negative for fracture or focal lesion. Sinuses/Orbits: Status post bilateral cataract extraction. Stable right ethmoid sinus retention cyst. Other: Deviation of the mid to anterior portion of the nasal septum to the right. CT CERVICAL SPINE FINDINGS  Alignment: Normal. Skull base and vertebrae: No acute fracture. No primary bone lesion or focal pathologic process. Soft tissues and spinal canal: No prevertebral soft tissue swelling. Moderate-to-marked canal stenosis and moderate foraminal stenosis on the left at the C2-3 level due to posterior longitudinal ligament ossification and bridging osteophytes. Disc levels: Marked changes of DISH with diffuse anterior fusion throughout the cervical and upper thoracic spine. Extensive atlantoaxial degenerative changes with sclerosis. Facet degenerative changes throughout the cervical spine. There is also posterior longitudinal ligament ossification and bone fusion, most pronounced at the C2-3 level, causing moderate to marked canal stenosis and moderate foraminal stenosis on the left. Upper chest: Previously demonstrated bilateral loculated pleural fluid/thickening. Other: Bilateral carotid artery calcification. IMPRESSION: 1. No acute intracranial abnormality. 2. No cervical spine fracture or subluxation. 3. No significant change in diffuse cerebral and cerebellar atrophy. 4. Extensive changes of DISH throughout the cervical upper thoracic spine with anterior and posterior bone fusion and associated moderate-to-marked canal stenosis on the left at the C2-3 level. 5. Bilateral carotid artery atheromatous calcifications. 6. Marked atlantoaxial degenerative changes. 7. Stable  bilateral loculated pleural fluid/thickening. Electronically Signed   By: Claudie Revering M.D.   On: 05/17/2017 16:59   Ct Cervical Spine Wo Contrast  Result Date: 05/17/2017 CLINICAL DATA:  Generalized weakness. Altered mental status since yesterday. EXAM: CT HEAD WITHOUT CONTRAST CT CERVICAL SPINE WITHOUT CONTRAST TECHNIQUE: Multidetector CT imaging of the head and cervical spine was performed following the standard protocol without intravenous contrast. Multiplanar CT image reconstructions of the cervical spine were also generated. COMPARISON:  Head CT dated 07/20/2016.  Chest CTA dated 04/30/2017. FINDINGS: CT HEAD FINDINGS Brain: Diffusely enlarged ventricles and subarachnoid spaces. No intracranial hemorrhage, mass lesion or CT evidence of acute infarction. Vascular: No hyperdense vessel or unexpected calcification. Skull: Normal. Negative for fracture or focal lesion. Sinuses/Orbits: Status post bilateral cataract extraction. Stable right ethmoid sinus retention cyst. Other: Deviation of the mid to anterior portion of the nasal septum to the right. CT CERVICAL SPINE FINDINGS Alignment: Normal. Skull base and vertebrae: No acute fracture. No primary bone lesion or focal pathologic process. Soft tissues and spinal canal: No prevertebral soft tissue swelling. Moderate-to-marked canal stenosis and moderate foraminal stenosis on the left at the C2-3 level due to posterior longitudinal ligament ossification and bridging osteophytes. Disc levels: Marked changes of DISH with diffuse anterior fusion throughout the cervical and upper thoracic spine. Extensive atlantoaxial degenerative changes with sclerosis. Facet degenerative changes throughout the cervical spine. There is also posterior longitudinal ligament ossification and bone fusion, most pronounced at the C2-3 level, causing moderate to marked canal stenosis and moderate foraminal stenosis on the left. Upper chest: Previously demonstrated bilateral loculated  pleural fluid/thickening. Other: Bilateral carotid artery calcification. IMPRESSION: 1. No acute intracranial abnormality. 2. No cervical spine fracture or subluxation. 3. No significant change in diffuse cerebral and cerebellar atrophy. 4. Extensive changes of DISH throughout the cervical upper thoracic spine with anterior and posterior bone fusion and associated moderate-to-marked canal stenosis on the left at the C2-3 level. 5. Bilateral carotid artery atheromatous calcifications. 6. Marked atlantoaxial degenerative changes. 7. Stable bilateral loculated pleural fluid/thickening. Electronically Signed   By: Claudie Revering M.D.   On: 05/17/2017 16:59    Procedures Procedures (including critical care time)  Medications Ordered in ED Medications  cefTRIAXone (ROCEPHIN) 1 g in sodium chloride 0.9 % 100 mL IVPB (1 g Intravenous New Bag/Given 05/17/17 1841)  doxycycline (VIBRA-TABS) tablet 100 mg (100 mg Oral Given 05/17/17 1841)  Initial Impression / Assessment and Plan / ED Course  I have reviewed the triage vital signs and the nursing notes.  Pertinent labs & imaging results that were available during my care of the patient were reviewed by me and considered in my medical decision making (see chart for details).    The patient will need admission due to the fact that he has a new pneumonia that is bilateral with his current respiratory status.  Patient has been stable but lives in an independent living facility and is having worsening symptoms of dizziness today where he is almost passing out.  Patient is advised the plan and all questions were answered.  Final Clinical Impressions(s) / ED Diagnoses   Final diagnoses:  None    ED Discharge Orders    None       Rebeca Allegra 05/17/17 Loleta Rose, MD 05/17/17 [phone]

## 2017-05-17 NOTE — ED Notes (Signed)
pts son [name] [phone]

## 2017-05-17 NOTE — ED Notes (Signed)
Pts position changed  pts son is going home  Sons name is Lawrence Warner

## 2017-05-17 NOTE — ED Triage Notes (Signed)
Pt brought in by [facility] from [facility], an independent living facility, for generalized weakness and neck pain x1 day. Pt states that when he walks he "feels really weak". Pt has hx of COPD, wears 2L at home. Pt A+Ox4 on arrival to ED.

## 2017-05-18 ENCOUNTER — Inpatient Hospital Stay (HOSPITAL_COMMUNITY): Payer: Medicare Other

## 2017-05-18 ENCOUNTER — Other Ambulatory Visit: Payer: Self-pay

## 2017-05-18 DIAGNOSIS — I5033 Acute on chronic diastolic (congestive) heart failure: Secondary | ICD-10-CM

## 2017-05-18 LAB — GLUCOSE, CAPILLARY
GLUCOSE-CAPILLARY: 118 mg/dL — AB (ref 65–99)
GLUCOSE-CAPILLARY: 160 mg/dL — AB (ref 65–99)
Glucose-Capillary: 125 mg/dL — ABNORMAL HIGH (ref 65–99)
Glucose-Capillary: 126 mg/dL — ABNORMAL HIGH (ref 65–99)

## 2017-05-18 LAB — LIPID PANEL
Cholesterol: 92 mg/dL (ref 0–200)
HDL: 25 mg/dL — AB (ref >40)
LDL CALC: 47 mg/dL (ref 0–99)
TRIGLYCERIDES: 101 mg/dL (ref <150)
Total CHOL/HDL Ratio: 3.7 RATIO
VLDL: 20 mg/dL (ref 0–40)

## 2017-05-18 LAB — TROPONIN I
Troponin I: 0.04 ng/mL (ref <0.03)
Troponin I: 0.03 ng/mL (ref <0.03)

## 2017-05-18 LAB — BASIC METABOLIC PANEL
Anion gap: 10 (ref 5–15)
BUN: 16 mg/dL (ref 6–20)
CALCIUM: 8.8 mg/dL — AB (ref 8.9–10.3)
CO2: 24 mmol/L (ref 22–32)
Chloride: 104 mmol/L (ref 101–111)
Creatinine, Ser: 1.28 mg/dL — ABNORMAL HIGH (ref 0.61–1.24)
GFR calc Af Amer: 53 mL/min — ABNORMAL LOW (ref >60)
GFR, EST NON AFRICAN AMERICAN: 45 mL/min — AB (ref >60)
GLUCOSE: 124 mg/dL — AB (ref 65–99)
Potassium: 4 mmol/L (ref 3.5–5.1)
SODIUM: 138 mmol/L (ref 135–145)

## 2017-05-18 LAB — BRAIN NATRIURETIC PEPTIDE: B Natriuretic Peptide: 1129.8 pg/mL — ABNORMAL HIGH (ref 0.0–100.0)

## 2017-05-18 LAB — LACTIC ACID, PLASMA
Lactic Acid, Venous: 1.1 mmol/L (ref 0.5–1.9)
Lactic Acid, Venous: 0.8 mmol/L (ref 0.5–1.9)

## 2017-05-18 LAB — HEMOGLOBIN A1C
Hgb A1c MFr Bld: 6.7 % — ABNORMAL HIGH (ref 4.8–5.6)
Mean Plasma Glucose: 145.59 mg/dL

## 2017-05-18 LAB — MAGNESIUM: Magnesium: 1.7 mg/dL (ref 1.7–2.4)

## 2017-05-18 LAB — APTT: APTT: 34 s (ref 24–36)

## 2017-05-18 LAB — MRSA PCR SCREENING: MRSA by PCR: NEGATIVE

## 2017-05-18 LAB — PROCALCITONIN: Procalcitonin: <0.1 ng/mL

## 2017-05-18 MED ORDER — FUROSEMIDE 10 MG/ML IJ SOLN
40.0000 mg | Freq: Two times a day (BID) | INTRAMUSCULAR | Status: DC
  Administered 2017-05-18 (×2): 40 mg via INTRAVENOUS
  Filled 2017-05-18 (×3): qty 4

## 2017-05-18 MED ORDER — FUROSEMIDE 10 MG/ML IJ SOLN: 40.0000 mg | Freq: Every day | INTRAMUSCULAR | Status: DC

## 2017-05-18 MED ORDER — PNEUMOCOCCAL VAC POLYVALENT 25 MCG/0.5ML IJ INJ
0.5000 mL | INJECTION | INTRAMUSCULAR | Status: AC
  Administered 2017-05-19: 0.5 mL via INTRAMUSCULAR
  Filled 2017-05-18: qty 0.5

## 2017-05-18 MED ORDER — OXYCODONE-ACETAMINOPHEN 5-325 MG PO TABS
1.0000 | ORAL_TABLET | ORAL | Status: DC | PRN
  Administered 2017-05-18: 1 via ORAL
  Filled 2017-05-18: qty 1

## 2017-05-18 MED ORDER — AMLODIPINE BESYLATE 5 MG PO TABS
5.0000 mg | ORAL_TABLET | Freq: Every day | ORAL | Status: DC
  Administered 2017-05-18 – 2017-05-19 (×2): 5 mg via ORAL
  Filled 2017-05-18 (×2): qty 1

## 2017-05-18 MED ORDER — IOPAMIDOL (ISOVUE-370) INJECTION 76%
INTRAVENOUS | Status: AC
  Administered 2017-05-18: 100 mL
  Filled 2017-05-18: qty 100

## 2017-05-18 NOTE — Progress Notes (Signed)
A/O x4.  BP elevated this morning. Administered prn hydralazine with good effect.  BP currently 149/60 HR 58. He also c/o leg cramp in both legs.  Applied heat pack and MD made aware.  Pt currently asleep.  Idolina Primer, RN

## 2017-05-18 NOTE — Progress Notes (Signed)
PROGRESS NOTE    Lawrence Warner  OYD:[id number] DOB: 1918-11-26 DOA: 05/17/2017 PCP: Clovia Cuff, MD   Brief Narrative: 84 year old male with history of hypertension, hyperlipidemia, diabetes, COPD on 2 L of oxygen, GERD, thrombocytopenia, coronary artery disease, CABG, diastolic chronic CHF, BPH, chronic kidney disease stage III, gait instability presented with generalized weakness, lightheadedness, chest pain, shortness of breath.  In the ER patient was found to have elevated BNP, positive d-dimer with elevated blood pressure of 215/113.  Assessment & Plan:   #Hypertensive emergency and acute on chronic diastolic congestive heart failure: -Start IV Lasix 40 mg twice a day -Patient is currently on nitroglycerin drip with improvement in blood pressure.  Continue losartan.  Added Norvasc.  Also on diuretics.  Try to wean down nitroglycerin gradually.  Currently on a stepdown unit. -Follow-up echocardiogram. -Strict ins and outs, daily weight.  #Chest pain: Chest pain today.  CT chest negative for PE.  Patient has pleural effusion and pulmonary edema.  On diuretics.  Doppler ultrasound of lower extremity ordered because of elevated d-dimer on admission.  Continue supportive care.  Mild elevation in troponin likely due to hypertensive emergency and history of CHF.  #Diet-controlled diabetes mellitus: Continue sliding scale.  Monitor blood sugar level.  #GERD on Protonix  #Chronic respiratory failure with hypoxia: On 2 L.  I do not think patient has pneumonia.  He is afebrile, no leukocytosis.  Chest x-ray reviewed.  #Chronic kidney disease stage III: Monitor BMP.  #Generalized weakness and physical deconditioning: Patient is 84 year old with comorbidities.  Continue supportive care and treatment.  Patient is DNR DNI.  I will order palliative care.  #Leg cramps: Check electrolytes.  On pain medication.  Doppler ultrasound ordered on admission.   DVT prophylaxis: Lovenox  subcutaneous Code Status: DNR Family Communication: No family at bedside Disposition Plan: In 2-3 days, placed on discharge.    Consultants:   Palliative care  Procedures: None doxycycline Antimicrobials: None  Subjective: Seen and examined at bedside.  Reported feeling gradually better.  Has bilateral leg cramps.  Denies chest pain, nausea vomiting.  Objective: Vitals:   05/18/17 0816 05/18/17 0927 05/18/17 1027 05/18/17 1126  BP: (!) 183/68 (!) 144/48 (!) 149/60 (!) 139/49  Pulse:  (!) 55 80 85  Resp:  (!) 23 (!) 23 (!) 28  Temp:    98 F (36.7 C)  TempSrc:    Oral  SpO2:  96% 98% 95%  Weight:      Height:        Intake/Output Summary (Last 24 hours) at 05/18/2017 1420 Last data filed at 05/18/2017 1100 Gross per 24 hour  Intake 72.3 ml  Output 1750 ml  Net -1677.7 ml   Filed Weights   05/17/17 1525 05/17/17 1532 05/18/17 0043  Weight: 90.7 kg (200 lb) 90.7 kg (200 lb) 90.1 kg (198 lb 10.2 oz)    Examination:  General exam: Appears calm and comfortable  Respiratory system: Bibasal decreased breath sound, respiratory for normal.  No wheezing or crackle. Cardiovascular system: S1 & S2 heard, RRR.  Trace pedal edema. Gastrointestinal system: Abdomen is nondistended, soft and nontender. Normal bowel sounds heard. Central nervous system: Alert awake and following commands Skin: No rashes, lesions or ulcers    Data Reviewed: I have personally reviewed following labs and imaging studies  CBC: Recent Labs  Lab 05/13/17 0930 05/17/17 1702  WBC 6.3 8.0  NEUTROABS 4.5 5.6  HGB 11.7* 12.4*  HCT 36.9* 39.7  MCV 92.9 93.4  PLT 226 191  Basic Metabolic Panel: Recent Labs  Lab 05/17/17 1702  NA 135  K 4.3  CL 102  CO2 24  GLUCOSE 109*  BUN 16  CREATININE 1.14  CALCIUM 9.0   GFR: Estimated Creatinine Clearance: 40.6 mL/min (by C-G formula based on SCr of 1.14 mg/dL). Liver Function Tests: Recent Labs  Lab 05/17/17 1702  AST 22  ALT 11*  ALKPHOS  63  BILITOT 0.5  PROT 7.3  ALBUMIN 3.4*   No results for input(s): LIPASE, AMYLASE in the last 168 hours. No results for input(s): AMMONIA in the last 168 hours. Coagulation Profile: No results for input(s): INR, PROTIME in the last 168 hours. Cardiac Enzymes: Recent Labs  Lab 05/17/17 2001 05/18/17 0217 05/18/17 0906  TROPONINI 0.03* 0.04* 0.03*   BNP (last 3 results) No results for input(s): PROBNP in the last 8760 hours. HbA1C: Recent Labs    05/18/17 0217  HGBA1C 6.7*   CBG: Recent Labs  Lab 05/17/17 2313 05/18/17 0735 05/18/17 1123  GLUCAP 134* 118* 160*   Lipid Profile: Recent Labs    05/18/17 0217  CHOL 92  HDL 25*  LDLCALC 47  TRIG 101  CHOLHDL 3.7   Thyroid Function Tests: No results for input(s): TSH, T4TOTAL, FREET4, T3FREE, THYROIDAB in the last 72 hours. Anemia Panel: No results for input(s): VITAMINB12, FOLATE, FERRITIN, TIBC, IRON, RETICCTPCT in the last 72 hours. Sepsis Labs: Recent Labs  Lab 05/18/17 0217 05/18/17 0531  PROCALCITON <0.10  --   LATICACIDVEN 1.1 0.8    Recent Results (from the past 240 hour(s))  Culture, blood (routine x 2)     Status: None (Preliminary result)   Collection Time: 05/17/17  7:36 PM  Result Value Ref Range Status   Specimen Description BLOOD BLOOD RIGHT FOREARM  Final   Special Requests   Final    IN PEDIATRIC BOTTLE Blood Culture results may not be optimal due to an excessive volume of blood received in culture bottles   Culture   Final    NO GROWTH < 24 HOURS Performed at [facility] [address] [place], [place] [address]    Report Status PENDING  Incomplete  Culture, blood (routine x 2)     Status: None (Preliminary result)   Collection Time: 05/17/17  7:41 PM  Result Value Ref Range Status   Specimen Description BLOOD BLOOD LEFT HAND  Final   Special Requests   Final    IN PEDIATRIC BOTTLE Blood Culture results may not be optimal due to an excessive volume of blood received in  culture bottles   Culture   Final    NO GROWTH < 24 HOURS Performed at [facility], [place] [address] [place], [place] [address]    Report Status PENDING  Incomplete  MRSA PCR Screening     Status: None   Collection Time: 05/18/17  1:03 AM  Result Value Ref Range Status   MRSA by PCR NEGATIVE NEGATIVE Final    Comment:        The GeneXpert MRSA Assay (FDA approved for NASAL specimens only), is one component of a comprehensive MRSA colonization surveillance program. It is not intended to diagnose MRSA infection nor to guide or monitor treatment for MRSA infections. Performed at [facility], [place] [address], [place], [place] [address]          Radiology Studies: Dg Chest 2 View  Result Date: 05/17/2017 CLINICAL DATA:  Generalized weakness and neck pain for 1 day.  EXAM: CHEST - 2 VIEW COMPARISON:  04/30/2017. FINDINGS: Stable enlarged cardiac silhouette and post CABG changes. Interval mild patchy opacity at both lung bases. Small right pleural effusion, mildly increased. Suggestion of a small left pleural effusions superimposed on the previously demonstrated chronic pleural thickening. The interstitial markings remain mildly prominent. Thoracic spine degenerative changes. IMPRESSION: 1. Interval mild patchy opacity at both lung bases, suspicious for pneumonia. 2. Mild increase in size of a small right pleural effusion with a probable interval small left pleural effusion. 3. Stable cardiomegaly and mild chronic interstitial lung disease. Electronically Signed   By: Claudie Revering M.D.   On: 05/17/2017 16:49   Ct Head Wo Contrast  Result Date: 05/17/2017 CLINICAL DATA:  Generalized weakness. Altered mental status since yesterday. EXAM: CT HEAD WITHOUT CONTRAST CT CERVICAL SPINE WITHOUT CONTRAST TECHNIQUE: Multidetector CT imaging of the head and cervical spine was performed following the standard protocol without intravenous contrast. Multiplanar CT image reconstructions of the  cervical spine were also generated. COMPARISON:  Head CT dated 07/20/2016.  Chest CTA dated 04/30/2017. FINDINGS: CT HEAD FINDINGS Brain: Diffusely enlarged ventricles and subarachnoid spaces. No intracranial hemorrhage, mass lesion or CT evidence of acute infarction. Vascular: No hyperdense vessel or unexpected calcification. Skull: Normal. Negative for fracture or focal lesion. Sinuses/Orbits: Status post bilateral cataract extraction. Stable right ethmoid sinus retention cyst. Other: Deviation of the mid to anterior portion of the nasal septum to the right. CT CERVICAL SPINE FINDINGS Alignment: Normal. Skull base and vertebrae: No acute fracture. No primary bone lesion or focal pathologic process. Soft tissues and spinal canal: No prevertebral soft tissue swelling. Moderate-to-marked canal stenosis and moderate foraminal stenosis on the left at the C2-3 level due to posterior longitudinal ligament ossification and bridging osteophytes. Disc levels: Marked changes of DISH with diffuse anterior fusion throughout the cervical and upper thoracic spine. Extensive atlantoaxial degenerative changes with sclerosis. Facet degenerative changes throughout the cervical spine. There is also posterior longitudinal ligament ossification and bone fusion, most pronounced at the C2-3 level, causing moderate to marked canal stenosis and moderate foraminal stenosis on the left. Upper chest: Previously demonstrated bilateral loculated pleural fluid/thickening. Other: Bilateral carotid artery calcification. IMPRESSION: 1. No acute intracranial abnormality. 2. No cervical spine fracture or subluxation. 3. No significant change in diffuse cerebral and cerebellar atrophy. 4. Extensive changes of DISH throughout the cervical upper thoracic spine with anterior and posterior bone fusion and associated moderate-to-marked canal stenosis on the left at the C2-3 level. 5. Bilateral carotid artery atheromatous calcifications. 6. Marked  atlantoaxial degenerative changes. 7. Stable bilateral loculated pleural fluid/thickening. Electronically Signed   By: Claudie Revering M.D.   On: 05/17/2017 16:59   Ct Angio Chest Pe W Or Wo Contrast  Result Date: 05/18/2017 CLINICAL DATA:  84 year old with chest pain. EXAM: CT ANGIOGRAPHY CHEST WITH CONTRAST TECHNIQUE: Multidetector CT imaging of the chest was performed using the standard protocol during bolus administration of intravenous contrast. Multiplanar CT image reconstructions and MIPs were obtained to evaluate the vascular anatomy. CONTRAST:  174mL ISOVUE-370 IOPAMIDOL (ISOVUE-370) INJECTION 76% COMPARISON:  Radiograph yesterday.  Chest CT PE protocol 04/30/2017 FINDINGS: Cardiovascular: There are no filling defects within the pulmonary arteries to suggest pulmonary embolus. Aortic atherosclerosis without aneurysm. Multi chamber cardiomegaly. No pericardial effusion. Post CABG with calcifications of the native coronary arteries. Mediastinum/Nodes: Calcified right hilar lymph nodes. Small mediastinal nodes not enlarged by size criteria. No left hilar adenopathy. Tiny hiatal hernia. Lungs/Pleura: Chronic fluid in the left interlobar fissure. Chronic partially loculated  fluid in the right lung adjacent to the minor fissure. Mild septal thickening. Geographic ground-glass opacities throughout both lungs. Bronchial thickening, most prominent in the lower lobes. Small bilateral pleural effusions, increased from prior CT. Upper Abdomen: No acute finding. Musculoskeletal: Chronic changes in the spine without acute abnormality. Post median sternotomy. Review of the MIP images confirms the above findings. IMPRESSION: 1. No pulmonary embolus. 2. Increased size of small bilateral pleural effusions from exam earlier this month. Chronic loculated fluid in the fissures. 3. Geographic ground-glass opacities throughout both lungs with mild bronchial thickening, likely pulmonary edema. 4. Bronchial thickening which can  also be seen with pulmonary edema or bronchitic change. 5. Electronically Signed   By: Jeb Levering M.D.   On: 05/18/2017 06:32   Ct Cervical Spine Wo Contrast  Result Date: 05/17/2017 CLINICAL DATA:  Generalized weakness. Altered mental status since yesterday. EXAM: CT HEAD WITHOUT CONTRAST CT CERVICAL SPINE WITHOUT CONTRAST TECHNIQUE: Multidetector CT imaging of the head and cervical spine was performed following the standard protocol without intravenous contrast. Multiplanar CT image reconstructions of the cervical spine were also generated. COMPARISON:  Head CT dated 07/20/2016.  Chest CTA dated 04/30/2017. FINDINGS: CT HEAD FINDINGS Brain: Diffusely enlarged ventricles and subarachnoid spaces. No intracranial hemorrhage, mass lesion or CT evidence of acute infarction. Vascular: No hyperdense vessel or unexpected calcification. Skull: Normal. Negative for fracture or focal lesion. Sinuses/Orbits: Status post bilateral cataract extraction. Stable right ethmoid sinus retention cyst. Other: Deviation of the mid to anterior portion of the nasal septum to the right. CT CERVICAL SPINE FINDINGS Alignment: Normal. Skull base and vertebrae: No acute fracture. No primary bone lesion or focal pathologic process. Soft tissues and spinal canal: No prevertebral soft tissue swelling. Moderate-to-marked canal stenosis and moderate foraminal stenosis on the left at the C2-3 level due to posterior longitudinal ligament ossification and bridging osteophytes. Disc levels: Marked changes of DISH with diffuse anterior fusion throughout the cervical and upper thoracic spine. Extensive atlantoaxial degenerative changes with sclerosis. Facet degenerative changes throughout the cervical spine. There is also posterior longitudinal ligament ossification and bone fusion, most pronounced at the C2-3 level, causing moderate to marked canal stenosis and moderate foraminal stenosis on the left. Upper chest: Previously demonstrated  bilateral loculated pleural fluid/thickening. Other: Bilateral carotid artery calcification. IMPRESSION: 1. No acute intracranial abnormality. 2. No cervical spine fracture or subluxation. 3. No significant change in diffuse cerebral and cerebellar atrophy. 4. Extensive changes of DISH throughout the cervical upper thoracic spine with anterior and posterior bone fusion and associated moderate-to-marked canal stenosis on the left at the C2-3 level. 5. Bilateral carotid artery atheromatous calcifications. 6. Marked atlantoaxial degenerative changes. 7. Stable bilateral loculated pleural fluid/thickening. Electronically Signed   By: Claudie Revering M.D.   On: 05/17/2017 16:59        Scheduled Meds: . alfuzosin  10 mg Oral Q breakfast  . aspirin  325 mg Oral Daily  . brimonidine  1 drop Both Eyes TID  . cycloSPORINE  1 drop Both Eyes Q12H  . enoxaparin (LOVENOX) injection  40 mg Subcutaneous Q24H  . furosemide  40 mg Intravenous BID  . insulin aspart  0-5 Units Subcutaneous QHS  . insulin aspart  0-9 Units Subcutaneous TID WC  . latanoprost  1 drop Left Eye QHS  . losartan  100 mg Oral Daily  . magnesium oxide  400 mg Oral Daily  . multivitamin with minerals  1 tablet Oral Daily  . pantoprazole  40 mg Oral Daily  .  polyvinyl alcohol  1 drop Both Eyes BID  . simvastatin  20 mg Oral q1800   Continuous Infusions: . nitroGLYCERIN 5 mcg/min (05/18/17 0521)     LOS: 1 day    Areg Bialas Tanna Furry, MD [facility] Pager [phone]  If 7PM-7AM, please contact night-coverage [email or web address] Password [email or web address] 05/18/2017, 2:20 PM

## 2017-05-18 NOTE — Evaluation (Signed)
Physical Therapy Evaluation Patient Details Name: Lawrence Warner MRN: [id number] DOB: May 25, 1918 Today's Date: 05/18/2017   History of Present Illness  84 year old male with history of hypertension, hyperlipidemia, diabetes, COPD on 2 L of oxygen, GERD, thrombocytopenia, coronary artery disease, CABG, diastolic chronic CHF, BPH, chronic kidney disease stage III, gait instability presented with generalized weakness, lightheadedness, chest pain, shortness of breath. Hypertensive emergency and acute on chronic diastolic congestive heart failure  Clinical Impression   Pt admitted with above diagnosis. Pt currently with functional limitations due to the deficits listed below (see PT Problem List). Pt with previous admission earlier this year, and has experienced a decline in function; Presents to day with decr activity tolerance, politely declining OOB activities; Spoke with pt's son, [name], who requests SNF for short-term Rehab be considered, and I agree; Mr. Schnebly will benefit form SNF for Rehab to maximize independence and safety with mobility;  Pt will benefit from skilled PT to increase their independence and safety with mobility to allow discharge to the venue listed below.       Follow Up Recommendations SNF    Equipment Recommendations  Other (comment)(TBD at next venue of care)    Recommendations for Other Services       Precautions / Restrictions Precautions Precautions: Fall      Mobility  Bed Mobility Overal bed mobility: Needs Assistance Bed Mobility: Rolling Rolling: Mod assist         General bed mobility comments: cues for use of rails and light mod assist to roll R and L for better pad placement; then son assisted with sliding up to [facility]  Transfers                 General transfer comment: politely declined transfers and amb  Ambulation/Gait             General Gait Details: politely declined transfers and amb  Stairs             Wheelchair Mobility    Modified Rankin (Stroke Patients Only)       Balance                                             Pertinent Vitals/Pain Pain Assessment: Faces Faces Pain Scale: Hurts even more Pain Location: cramping bil Lower legs Pain Descriptors / Indicators: Aching;Cramping Pain Intervention(s): Monitored during session;Other (comment)(applied pt's OTC muscle cream)    Home Living Family/patient expects to be discharged to:: Private residence Living Arrangements: Other (Comment) Available Help at Discharge: Available PRN/intermittently;Family Type of Home: Independent living facility Home Access: Level entry     Home Layout: One level Home Equipment: Shower seat - built in;Grab bars - tub/shower;Walker - 2 wheels;Electric scooter;Wheelchair - manual;Cane - single point      Prior Function Level of Independence: Independent with assistive device(s)         Comments: Cane for indoor ambulation and scooter for longer distance.      Hand Dominance   Dominant Hand: Right    Extremity/Trunk Assessment   Upper Extremity Assessment Upper Extremity Assessment: Generalized weakness    Lower Extremity Assessment Lower Extremity Assessment: Generalized weakness    Cervical / Trunk Assessment Cervical / Trunk Assessment: Other exceptions Cervical / Trunk Exceptions: Neck is fused; difficulty looking down to see food   Communication   Communication: No difficulties  Cognition Arousal/Alertness: Awake/alert  Behavior During Therapy: WFL for tasks assessed/performed Overall Cognitive Status: Within Functional Limits for tasks assessed                                        General Comments General comments (skin integrity, edema, etc.): Politely declined transfers and amb; Session then focused on optimal positioning in bed; showed pt and his son how to use the bed controls to acheive a near seated position; noted hand  swelling and placed pillows to elevate UEs; Gave pt and son time to work with bed controls; ended session with dinner delivered, pt in near-chair position    Exercises     Assessment/Plan    PT Assessment Patient needs continued PT services  PT Problem List Decreased strength;Decreased range of motion;Decreased activity tolerance;Decreased balance;Decreased mobility;Decreased coordination;Decreased knowledge of use of DME;Decreased knowledge of precautions;Cardiopulmonary status limiting activity       PT Treatment Interventions DME instruction;Gait training;Functional mobility training;Therapeutic activities;Therapeutic exercise;Balance training;Patient/family education    PT Goals (Current goals can be found in the Care Plan section)  Acute Rehab PT Goals Patient Stated Goal: did not state PT Goal Formulation: With patient Time For Goal Achievement: 06/08/17 Potential to Achieve Goals: Fair    Frequency Min 2X/week   Barriers to discharge        Co-evaluation               AM-PAC PT "6 Clicks" Daily Activity  Outcome Measure Difficulty turning over in bed (including adjusting bedclothes, sheets and blankets)?: Unable Difficulty moving from lying on back to sitting on the side of the bed? : Unable Difficulty sitting down on and standing up from a chair with arms (e.g., wheelchair, bedside commode, etc,.)?: Unable Help needed moving to and from a bed to chair (including a wheelchair)?: A Lot Help needed walking in hospital room?: A Lot Help needed climbing 3-5 steps with a railing? : Total 6 Click Score: 8    End of Session Equipment Utilized During Treatment: Oxygen Activity Tolerance: Patient tolerated treatment well Patient left: in bed;with call bell/phone within reach;with family/visitor present Nurse Communication: Mobility status PT Visit Diagnosis: Muscle weakness (generalized) (M62.81);Other abnormalities of gait and mobility (R26.89)    Time:  7893-8101 PT Time Calculation (min) (ACUTE ONLY): 40 min   Charges:   PT Evaluation $PT Eval Moderate Complexity: 1 Mod PT Treatments $Therapeutic Activity: 23-37 mins   PT G Codes:        Roney Marion, PT  Acute Rehabilitation Services Pager [phone] Office [phone]   Colletta [place] 05/18/2017, 5:54 PM

## 2017-05-19 ENCOUNTER — Inpatient Hospital Stay (HOSPITAL_COMMUNITY): Payer: Medicare Other

## 2017-05-19 DIAGNOSIS — E119 Type 2 diabetes mellitus without complications: Secondary | ICD-10-CM | POA: Diagnosis not present

## 2017-05-19 DIAGNOSIS — R7989 Other specified abnormal findings of blood chemistry: Secondary | ICD-10-CM

## 2017-05-19 DIAGNOSIS — R079 Chest pain, unspecified: Secondary | ICD-10-CM

## 2017-05-19 LAB — CBC
HEMATOCRIT: 35.5 % — AB (ref 39.0–52.0)
Hemoglobin: 11 g/dL — ABNORMAL LOW (ref 13.0–17.0)
MCH: 28.9 pg (ref 26.0–34.0)
MCHC: 31 g/dL (ref 30.0–36.0)
MCV: 93.4 fL (ref 78.0–100.0)
PLATELETS: 152 10*3/uL (ref 150–400)
RBC: 3.8 MIL/uL — AB (ref 4.22–5.81)
RDW: 14.5 % (ref 11.5–15.5)
WBC: 7.2 10*3/uL (ref 4.0–10.5)

## 2017-05-19 LAB — BASIC METABOLIC PANEL
Anion gap: 12 (ref 5–15)
BUN: 23 mg/dL — ABNORMAL HIGH (ref 6–20)
CO2: 24 mmol/L (ref 22–32)
Calcium: 8.8 mg/dL — ABNORMAL LOW (ref 8.9–10.3)
Chloride: 103 mmol/L (ref 101–111)
Creatinine, Ser: 1.49 mg/dL — ABNORMAL HIGH (ref 0.61–1.24)
GFR, EST AFRICAN AMERICAN: 44 mL/min — AB (ref >60)
GFR, EST NON AFRICAN AMERICAN: 38 mL/min — AB (ref >60)
GLUCOSE: 118 mg/dL — AB (ref 65–99)
POTASSIUM: 4.1 mmol/L (ref 3.5–5.1)
Sodium: 139 mmol/L (ref 135–145)

## 2017-05-19 LAB — EXPECTORATED SPUTUM ASSESSMENT W REFEX TO RESP CULTURE

## 2017-05-19 LAB — GLUCOSE, CAPILLARY
GLUCOSE-CAPILLARY: 132 mg/dL — AB (ref 65–99)
Glucose-Capillary: 189 mg/dL — ABNORMAL HIGH (ref 65–99)
Glucose-Capillary: 144 mg/dL — ABNORMAL HIGH (ref 65–99)
Glucose-Capillary: 154 mg/dL — ABNORMAL HIGH (ref 65–99)

## 2017-05-19 LAB — EXPECTORATED SPUTUM ASSESSMENT W GRAM STAIN, RFLX TO RESP C

## 2017-05-19 LAB — ECHOCARDIOGRAM COMPLETE
Height: 67 in
Weight: 3121.71 oz

## 2017-05-19 MED ORDER — LOSARTAN POTASSIUM 50 MG PO TABS: 100.0000 mg | ORAL_TABLET | Freq: Every day | ORAL | Status: DC

## 2017-05-19 MED ORDER — FUROSEMIDE 40 MG PO TABS
40.0000 mg | ORAL_TABLET | Freq: Every day | ORAL | Status: DC
  Administered 2017-05-19: 40 mg via ORAL
  Filled 2017-05-19: qty 1

## 2017-05-19 NOTE — Progress Notes (Signed)
*  Preliminary Results* Bilateral lower extremity venous duplex completed. Right lower extremity is positive for acute deep vein thrombosis involving a very small segment of a single posterior tibial vein. The left lower extremity is negative for deep vein thrombosis. There is no evidence of Baker's cyst bilaterally.  Preliminary results discussed with Dr. Carolin Sicks.  05/19/2017 4:28 PM Maudry Mayhew, BS, RVT, RDCS, RDMS

## 2017-05-19 NOTE — Clinical Social Work Note (Signed)
Clinical Social Work Assessment  Patient Details  Name: Lawrence Warner MRN: [id number] Date of Birth: [birth year]  Date of referral:  05/19/17               Reason for consult:  Facility Placement                Permission sought to share information with:  Facility Art therapist granted to share information::  No(pt disoriented spoke with pt son)  Name::     Personnel officer::  SNF  Relationship::  pt son  Contact Information:     Housing/Transportation Living arrangements for the past 2 months:  Materials engineer, [place] of Information:  Adult Children Patient Interpreter Needed:  None Criminal Activity/Legal Involvement Pertinent to Current Situation/Hospitalization:  No - Comment as needed Significant Relationships:  Adult Children Lives with:  Self Do you feel safe going back to the place where you live?  No Need for family participation in patient care:  Yes (Comment)(decision making)  Care giving concerns:  Pt lives in IDL and has been unsafe at home.   Social Worker assessment / plan:  CSW spoke with pt son concerning PT recommendation for SNF.  Pt son very familiar with process due to 3 placements in the past year at SNFs for his dad.  Employment status:  Retired Forensic scientist:  [employer] PT Recommendations:  [place] / Referral to community resources:  [place]  Patient/Family's Response to care:  Pt son agreeable to SNF again and hopeful for [facility] where they had a good experience concerned that this might need to turn into LTC placement since pt has required placement so much recently.  Patient/Family's Understanding of and Emotional Response to Diagnosis, Current Treatment, and Prognosis:  Pt son seems to have good understanding of pt conditions and need for increased assistance.  Emotional Assessment Appearance:  Appears stated  age Attitude/Demeanor/Rapport:    Affect (typically observed):  Appropriate Orientation:  Oriented to Self, Oriented to Place, Oriented to  Time, Oriented to Situation Alcohol / Substance use:  Not Applicable Psych involvement (Current and /or in the community):  No (Comment)  Discharge Needs  Concerns to be addressed:  Care Coordination Readmission within the last 30 days:  Yes Current discharge risk:  Physical Impairment Barriers to Discharge:  Continued Medical Work up   Jorge Ny, LCSW 05/19/2017, 3:17 PM

## 2017-05-19 NOTE — Progress Notes (Signed)
  Echocardiogram 2D Echocardiogram has been performed.  Lawrence Warner 05/19/2017, 10:37 AM

## 2017-05-19 NOTE — Evaluation (Signed)
Occupational Therapy Evaluation Patient Details Name: Lawrence Warner MRN: [id number] DOB: [birth year] Today's Date: 05/19/2017    History of Present Illness 84 year old male with history of hypertension, hyperlipidemia, diabetes, COPD on 2 L of oxygen, GERD, thrombocytopenia, coronary artery disease, CABG, diastolic chronic CHF, BPH, chronic kidney disease stage III, gait instability presented with generalized weakness, lightheadedness, chest pain, shortness of breath. Hypertensive emergency and acute on chronic diastolic congestive heart failure   Clinical Impression   PTA, pt was living in independent living facility and independent with RW or scooter for functional mobility and basic ADL. He has meals and cleaning provided by facility. Pt currently declining mobility or ADL participation greater than bed level ADL and strength testing. He demonstrates generalized weakness (3/5 strength grossly) in B UE impacting his ability to participate in ADL at [facility]. He currently requires min assist for UB ADL and max assist for LB ADL at bed level. Pt reports that he is fatigued from sitting at EOB earlier this am and declined OOB to chair despite maximum encouragement. He did; however, agree to get up with nursing staff this afternoon. He would benefit from continued OT services while admitted to facilitate improvement toward ADL independence and recommend short-term SNF level rehabilitation post-acute D/C.     Follow Up Recommendations  SNF;Supervision/Assistance - 24 hour    Equipment Recommendations  3 in 1 bedside commode    Recommendations for Other Services       Precautions / Restrictions Precautions Precautions: Fall Restrictions Weight Bearing Restrictions: No      Mobility Bed Mobility               General bed mobility comments: Adamantly declined due to fatigue.  Transfers                 General transfer comment: Adamantly declined transfers and ambulation.     Balance                                           ADL either performed or assessed with clinical judgement   ADL Overall ADL's : Needs assistance/impaired Eating/Feeding: Set up;Bed level   Grooming: Set up;Bed level   Upper Body Bathing: Minimal assistance;Bed level   Lower Body Bathing: Maximal assistance;Bed level   Upper Body Dressing : Minimal assistance;Bed level   Lower Body Dressing: Maximal assistance;Bed level                 General ADL Comments: Despite maximum encouragement from therapist and daughter-in-law, pt continued to adamantly decline more than bed level ADL and strength testing.      Vision Patient Visual Report: No change from baseline Additional Comments: No deficits noted. Will continue to assess.      Perception     Praxis      Pertinent Vitals/Pain Pain Assessment: Faces Faces Pain Scale: Hurts a little bit Pain Location: generalized Pain Descriptors / Indicators: Aching;Cramping Pain Intervention(s): Monitored during session     Hand Dominance Right   Extremity/Trunk Assessment Upper Extremity Assessment Upper Extremity Assessment: Generalized weakness(Grossly 3/5 strength)   Lower Extremity Assessment Lower Extremity Assessment: Generalized weakness   Cervical / Trunk Assessment Cervical / Trunk Assessment: Other exceptions Cervical / Trunk Exceptions: Neck is fused; difficulty looking down to see food    Communication Communication Communication: No difficulties   Cognition Arousal/Alertness: Awake/alert Behavior During Therapy: [facility] for  tasks assessed/performed Overall Cognitive Status: Within Functional Limits for tasks assessed                                     General Comments  Pt's daughter-in-law present during session. Pt reports fatigue since sitting at EOB this morning. Reports he was sitting at EOB for 2 hours; however, daughter-in-law reports that he dat for 2 hours  yesterday.    Exercises     Shoulder Instructions      Home Living Family/patient expects to be discharged to:: (Independent living facility. [facility] Living Arrangements: Alone Available Help at Discharge: Available PRN/intermittently;Family Type of Home: Independent living facility Home Access: Level entry     Home Layout: One level     Bathroom Shower/Tub: Occupational psychologist: Standard     Home Equipment: Shower seat - built in;Grab bars - tub/shower;Walker - 2 wheels;Electric scooter;Wheelchair - manual;Cane - single point          Prior Functioning/Environment Level of Independence: Independent with assistive device(s)        Comments: Independent with assistive devices for basic ADL. Facility provides meals and cleaning services.         OT Problem List: Decreased strength;Decreased range of motion;Decreased activity tolerance;Impaired balance (sitting and/or standing);Decreased safety awareness;Decreased knowledge of use of DME or AE;Decreased knowledge of precautions;Pain      OT Treatment/Interventions:      OT Goals(Current goals can be found in the care plan section) Acute Rehab OT Goals Patient Stated Goal: to take a nap OT Goal Formulation: With patient Time For Goal Achievement: 06/02/17 Potential to Achieve Goals: Good ADL Goals Pt Will Perform Grooming: with modified independence;sitting Pt Will Transfer to Toilet: with min assist;ambulating;bedside commode Pt Will Perform Toileting - Clothing Manipulation and hygiene: with min assist;sit to/from stand Pt/caregiver will Perform Home Exercise Program: Increased strength;Both right and left upper extremity;With written HEP provided;With Supervision  OT Frequency:     Barriers to D/C:            Co-evaluation              AM-PAC PT "6 Clicks" Daily Activity     Outcome Measure Help from another person eating meals?: A Little Help from another person taking care  of personal grooming?: A Little Help from another person toileting, which includes using toliet, bedpan, or urinal?: A Lot Help from another person bathing (including washing, rinsing, drying)?: A Lot Help from another person to put on and taking off regular upper body clothing?: A Lot Help from another person to put on and taking off regular lower body clothing?: A Lot 6 Click Score: 14   End of Session Equipment Utilized During Treatment: Gait belt;Rolling walker Nurse Communication: Mobility status  Activity Tolerance: Patient tolerated treatment well Patient left: in bed;with call bell/phone within reach;with bed alarm set  OT Visit Diagnosis: Muscle weakness (generalized) (M62.81);Pain Pain - Right/Left: (bilateral ) Pain - part of body: Leg                Time: 1125-1141 OT Time Calculation (min): 16 min Charges:  OT General Charges $OT Visit: 1 Visit OT Evaluation $OT Eval Low Complexity: 1 Low G-Codes:     Norman Herrlich, MS OTR/L  Pager: Celeste A Ondre Salvetti 05/19/2017, 1:05 PM

## 2017-05-19 NOTE — Progress Notes (Addendum)
Pt. HR drops to 38, non sustaining. Pt asymptomatic. Will monitor.

## 2017-05-19 NOTE — NC FL2 (Signed)
[place] MEDICAID FL2 LEVEL OF CARE SCREENING TOOL     IDENTIFICATION  Patient Name: Lawrence Warner Birthdate: 04-13-1918 Sex: male Admission Date (Current Location): 05/17/2017  [facility] and [place] Number:  Herbalist and Address:  [facility], [place] [address], [place], [place] [address]      Provider Number: [id number]  Attending Physician Name and Address:  Rosita Fire, MD  Relative Name and Phone Number:       Current Level of Care: Hospital Recommended Level of Care: [place] Prior Approval Number:    Date Approved/Denied:   PASRR Number: [id number]  Discharge Plan: SNF    Current Diagnoses: Patient Active Problem List   Diagnosis Date Noted  . Generalized weakness 05/17/2017  . Hypertensive emergency 05/17/2017  . RSV bronchitis   . Bronchospasm with bronchitis, acute 03/14/2017  . Acute on chronic diastolic CHF (congestive heart failure) ([place]) 11/26/2016  . CRI (chronic renal insufficiency), stage 3 (moderate) ([place]) 11/26/2016  . LBBB (left bundle branch block) 11/26/2016  . Hypoxia   . Chronic respiratory failure with hypoxia ([place]) 02/28/2016  . Dyslipidemia 02/28/2016  . Constipation 12/07/2015  . Bradycardia 11/28/2015  . Port catheter in place 10/24/2015  . GERD (gastroesophageal reflux disease) 10/09/2015  . Gait instability 10/09/2015  . Chest pain 10/05/2015  . Diet-controlled diabetes mellitus ([place]) 05/07/2015  . Hx of CABG 05/07/2015  . Essential hypertension 05/07/2015  . Muscle cramps 04/25/2015  . Shortness of breath 04/11/2015  . Idiopathic thrombocytopenic purpura ([place]) 09/27/2014    Orientation RESPIRATION BLADDER Height & Weight     Self, Time, Situation, Place  O2(4L [place]) Incontinent Weight: 195 lb 1.7 oz (88.5 kg) Height:  5\' 7"  (170.2 cm)  BEHAVIORAL SYMPTOMS/MOOD NEUROLOGICAL BOWEL NUTRITION STATUS      Continent Diet  AMBULATORY STATUS COMMUNICATION OF NEEDS Skin    Extensive Assist Verbally Normal                       Personal Care Assistance Level of Assistance  Bathing, Dressing Bathing Assistance: Maximum assistance   Dressing Assistance: Maximum assistance     Functional Limitations Info             SPECIAL CARE FACTORS FREQUENCY  PT (By licensed PT), OT (By licensed OT)     PT Frequency: 5/wk OT Frequency: 5/wk            Contractures      Additional Factors Info  Code Status, Allergies, Insulin Sliding Scale Code Status Info: DNR Allergies Info: Alfuzosin Hcl Er, Imdur Isosorbide Dinitrate, Penicillins, Tape   Insulin Sliding Scale Info: 4/day       Current Medications (05/19/2017):  This is the current hospital active medication list Current Facility-Administered Medications  Medication Dose Route Frequency Provider Last Rate Last Dose  . acetaminophen (TYLENOL) tablet 650 mg  650 mg Oral Daily PRN Ivor Costa, MD      . albuterol (PROVENTIL) (2.5 MG/3ML) 0.083% nebulizer solution 2.5 mg  2.5 mg Nebulization Q4H PRN Ivor Costa, MD      . alfuzosin (UROXATRAL) 24 hr tablet 10 mg  10 mg Oral Q breakfast Ivor Costa, MD   10 mg at 05/19/17 0820  . amLODipine (NORVASC) tablet 5 mg  5 mg Oral Daily Rosita Fire, MD   5 mg at 05/19/17 5732  . aspirin tablet 325 mg  325 mg Oral Daily Ivor Costa, MD   325 mg  at 05/18/17 2152  . brimonidine (ALPHAGAN) 0.2 % ophthalmic solution 1 drop  1 drop Both Eyes TID Ivor Costa, MD   1 drop at 05/19/17 0831  . clobetasol cream (TEMOVATE) 9.21 % 1 application  1 application Topical BID PRN Ivor Costa, MD      . cycloSPORINE (RESTASIS) 0.05 % ophthalmic emulsion 1 drop  1 drop Both Eyes Q12H Ivor Costa, MD   1 drop at 05/19/17 [phone]  . dextromethorphan-guaiFENesin (MUCINEX DM) 30-600 MG per 12 hr tablet 1 tablet  1 tablet Oral BID PRN Ivor Costa, MD      . docusate sodium (COLACE) capsule 100 mg  100 mg Oral BID PRN Ivor Costa, MD      . enoxaparin (LOVENOX) injection 40 mg  40  mg Subcutaneous Q24H Ivor Costa, MD   40 mg at 05/19/17 0820  . fluticasone (FLONASE) 50 MCG/ACT nasal spray 2 spray  2 spray Each Nare BID PRN Ivor Costa, MD      . furosemide (LASIX) tablet 40 mg  40 mg Oral Daily Rosita Fire, MD   40 mg at 05/19/17 0818  . hydrALAZINE (APRESOLINE) injection 5 mg  5 mg Intravenous Q2H PRN Ivor Costa, MD   5 mg at 05/18/17 0816  . hydroxypropyl methylcellulose / hypromellose (ISOPTO TEARS / GONIOVISC) 2.5 % ophthalmic solution 1 drop  1 drop Both Eyes TID PRN Ivor Costa, MD      . insulin aspart (novoLOG) injection 0-5 Units  0-5 Units Subcutaneous QHS Ivor Costa, MD      . insulin aspart (novoLOG) injection 0-9 Units  0-9 Units Subcutaneous TID WC Ivor Costa, MD   1 Units at 05/19/17 [phone]  . latanoprost (XALATAN) 0.005 % ophthalmic solution 1 drop  1 drop Left Eye Gordan Payment, MD   1 drop at 05/18/17 2234  . [START ON 05/20/2017] losartan (COZAAR) tablet 100 mg  100 mg Oral Daily Rosita Fire, MD      . magnesium oxide (MAG-OX) tablet 400 mg  400 mg Oral Daily Ivor Costa, MD   400 mg at 05/19/17 0819  . morphine 4 MG/ML injection 1 mg  1 mg Intravenous Q4H PRN Ivor Costa, MD   1 mg at 05/18/17 1429  . multivitamin with minerals tablet 1 tablet  1 tablet Oral Daily Ivor Costa, MD   1 tablet at 05/19/17 0819  . nitroGLYCERIN (NITROSTAT) SL tablet 0.4 mg  0.4 mg Sublingual Q5 min PRN Ivor Costa, MD      . nitroGLYCERIN 50 mg in dextrose 5 % 250 mL (0.2 mg/mL) infusion  0-200 mcg/min Intravenous Titrated Ivor Costa, MD 1.5 mL/hr at 05/18/17 0521 5 mcg/min at 05/18/17 0521  . ondansetron (ZOFRAN) injection 4 mg  4 mg Intravenous Q6H PRN Ivor Costa, MD      . oxyCODONE-acetaminophen (PERCOCET/ROXICET) 5-325 MG per tablet 1 tablet  1 tablet Oral Q4H PRN Ivor Costa, MD   1 tablet at 05/18/17 1744  . pantoprazole (PROTONIX) EC tablet 40 mg  40 mg Oral Daily Ivor Costa, MD   40 mg at 05/19/17 0819  . polyvinyl alcohol (LIQUIFILM TEARS) 1.4 % ophthalmic  solution 1 drop  1 drop Both Eyes BID Ivor Costa, MD   1 drop at 05/18/17 2234  . simvastatin (ZOCOR) tablet 20 mg  20 mg Oral q1800 Ivor Costa, MD   20 mg at 05/18/17 1744  . zolpidem (AMBIEN) tablet 5 mg  5 mg Oral QHS PRN Ivor Costa, MD  Facility-Administered Medications Ordered in Other Encounters  Medication Dose Route Frequency Provider Last Rate Last Dose  . cyanocobalamin ((VITAMIN B-12)) injection 1,000 mcg  1,000 mcg Subcutaneous Q30 days [name], MD   1,000 mcg at 03/13/16 1348  . cyanocobalamin ((VITAMIN B-12)) injection 1,000 mcg  1,000 mcg Subcutaneous Q30 days [name], MD   1,000 mcg at 05/14/16 1001  . cyanocobalamin ((VITAMIN B-12)) injection 1,000 mcg  1,000 mcg Subcutaneous Q30 days [name], MD   1,000 mcg at 09/10/16 1022  . cyanocobalamin ((VITAMIN B-12)) injection 1,000 mcg  1,000 mcg Subcutaneous Q30 days [name], MD   1,000 mcg at 12/03/16 1038  . romiPLOStim (NPLATE) injection 50 mcg  0.5 mcg/kg Subcutaneous Weekly [name], MD   50 mcg at 03/13/16 1349  . romiPLOStim (NPLATE) injection 50 mcg  50 mcg Subcutaneous Weekly [name], MD   50 mcg at 05/07/16 1106  . romiPLOStim (NPLATE) injection 50 mcg  0.5 mcg/kg Subcutaneous Weekly [name], MD   50 mcg at 05/14/16 1001  . romiPLOStim (NPLATE) injection 50 mcg  50 mcg Subcutaneous Weekly [name], MD   50 mcg at 08/13/16 0931  . romiPLOStim (NPLATE) injection 50 mcg  50 mcg Subcutaneous Weekly [name], MD   50 mcg at 08/27/16 6269  . romiPLOStim (NPLATE) injection 50 mcg  50 mcg Subcutaneous Weekly [name], MD   50 mcg at 09/03/16 1027  . romiPLOStim (NPLATE) injection 50 mcg  0.5 mcg/kg Subcutaneous Weekly [name], MD   50 mcg at 09/10/16 1023  . romiPLOStim (NPLATE) injection 50 mcg  0.5 mcg/kg Subcutaneous Weekly [name], MD   50 mcg at 12/03/16 1038  . romiPLOStim  (NPLATE) injection 50 mcg  50 mcg Subcutaneous Weekly [name], MD   50 mcg at 02/11/17 1039  . romiPLOStim (NPLATE) injection 50 mcg  50 mcg Subcutaneous Weekly [name], MD   50 mcg at 04/01/17 1150     Discharge Medications: Please see discharge summary for a list of discharge medications.  Relevant Imaging Results:  Relevant Lab Results:   Additional Information SS#: [id number]  Jorge Ny, LCSW

## 2017-05-19 NOTE — Progress Notes (Signed)
Pt got up and sat in a chair with one person assist.  Idolina Primer, RN

## 2017-05-19 NOTE — Progress Notes (Signed)
PROGRESS NOTE    Lawrence Warner  UKG:[id number] DOB: 25-Mar-1918 DOA: 05/17/2017 PCP: Clovia Cuff, MD   Brief Narrative: 84 year old male with history of hypertension, hyperlipidemia, diabetes, COPD on 2 L of oxygen, GERD, thrombocytopenia, coronary artery disease, CABG, diastolic chronic CHF, BPH, chronic kidney disease stage III, gait instability presented with generalized weakness, lightheadedness, chest pain, shortness of breath.  In the ER patient was found to have elevated BNP, positive d-dimer with elevated blood pressure of 215/113.  Assessment & Plan:   #Hypertensive emergency and acute on chronic diastolic congestive heart failure: -Blood pressure is better controlled.  Discontinue nitroglycerin drip.  Continue Norvasc.  Holding Lasix because of elevated creatinine level.  Held her losartan today as well.  Echocardiogram with EF of 50%.  Strict ins and outs.  Closely monitor heart rate and blood pressure.  #Chest pain:  CT chest negative for PE.  Patient has pleural effusion and pulmonary edema.  Doppler ultrasound of lower extremity ordered because of elevated d-dimer on admission.  Continue supportive care.  Mild elevation in troponin likely due to hypertensive emergency and history of CHF.  Chest pain today.  Repeat chest x-ray in the morning.  #Diet-controlled diabetes mellitus: Continue sliding scale.  Monitor blood sugar level.  #GERD on Protonix  #Chronic respiratory failure with hypoxia: On 2 L.  I do not think patient has pneumonia.  He is afebrile, no leukocytosis.  Chest x-ray reviewed.  Try to wean down oxygen to 2 L.  #Acute kidney injury on chronic kidney disease stage III: Holding both losartan and Lasix.  Monitor BMP.  Avoid nephrotoxins.  #Generalized weakness and physical deconditioning: Patient is 84 year old with comorbidities.  Continue supportive care and treatment.  Patient is DNR DNI.  I will order palliative care.  PT OT recommended SNF.  Social worker  consulted.  #Leg cramps: Check electrolytes.  On pain medication.  Doppler ultrasound ordered on admission.   DVT prophylaxis: Lovenox subcutaneous Code Status: DNR Family Communication: No family at bedside Disposition Plan: In 2-3 days, placed on discharge.    Consultants:   Palliative care  Procedures: None  Antimicrobials: None  Subjective: Seen and examined at bedside.  Still having cough and mild shortness of breath.  No chest pain.  Feels overall better than yesterday.  No nausea vomiting. Objective: Vitals:   05/19/17 0013 05/19/17 0429 05/19/17 0818 05/19/17 1119  BP: (!) 113/53 (!) 158/57 (!) 146/73 (!) 112/42  Pulse: 68 79  77  Resp: 20 (!) 23  (!) 22  Temp: 98.6 F (37 C) 98.9 F (37.2 C)  97.9 F (36.6 C)  TempSrc: Axillary Axillary  Oral  SpO2: 96% 98%  94%  Weight:  88.5 kg (195 lb 1.7 oz)    Height:        Intake/Output Summary (Last 24 hours) at 05/19/2017 1424 Last data filed at 05/19/2017 1126 Gross per 24 hour  Intake 180 ml  Output 1050 ml  Net -870 ml   Filed Weights   05/17/17 1532 05/18/17 0043 05/19/17 0429  Weight: 90.7 kg (200 lb) 90.1 kg (198 lb 10.2 oz) 88.5 kg (195 lb 1.7 oz)    Examination:  General exam: Not in distress Respiratory system: Bibasal decreased breath sound, respiratory for normal. Cardiovascular system: Regular rate rhythm S1-S2 normal.  Trace edema. Gastrointestinal system: Abdomen is nondistended, soft and nontender. Normal bowel sounds heard. Central nervous system: Alert awake and following commands Skin: No rashes, lesions or ulcers    Data Reviewed: I have  personally reviewed following labs and imaging studies  CBC: Recent Labs  Lab 05/13/17 0930 05/17/17 1702 05/19/17 0421  WBC 6.3 8.0 7.2  NEUTROABS 4.5 5.6  --   HGB 11.7* 12.4* 11.0*  HCT 36.9* 39.7 35.5*  MCV 92.9 93.4 93.4  PLT 226 191 242   Basic Metabolic Panel: Recent Labs  Lab 05/17/17 1702 05/18/17 1533 05/19/17 0421  NA 135  138 139  K 4.3 4.0 4.1  CL 102 104 103  CO2 24 24 24   GLUCOSE 109* 124* 118*  BUN 16 16 23*  CREATININE 1.14 1.28* 1.49*  CALCIUM 9.0 8.8* 8.8*  MG  --  1.7  --    GFR: Estimated Creatinine Clearance: 30.8 mL/min (A) (by C-G formula based on SCr of 1.49 mg/dL (H)). Liver Function Tests: Recent Labs  Lab 05/17/17 1702  AST 22  ALT 11*  ALKPHOS 63  BILITOT 0.5  PROT 7.3  ALBUMIN 3.4*   No results for input(s): LIPASE, AMYLASE in the last 168 hours. No results for input(s): AMMONIA in the last 168 hours. Coagulation Profile: No results for input(s): INR, PROTIME in the last 168 hours. Cardiac Enzymes: Recent Labs  Lab 05/17/17 2001 05/18/17 0217 05/18/17 0906  TROPONINI 0.03* 0.04* 0.03*   BNP (last 3 results) No results for input(s): PROBNP in the last 8760 hours. HbA1C: Recent Labs    05/18/17 0217  HGBA1C 6.7*   CBG: Recent Labs  Lab 05/18/17 1123 05/18/17 1725 05/18/17 2127 05/19/17 0745 05/19/17 1110  GLUCAP 160* 125* 126* 132* 189*   Lipid Profile: Recent Labs    05/18/17 0217  CHOL 92  HDL 25*  LDLCALC 47  TRIG 101  CHOLHDL 3.7   Thyroid Function Tests: No results for input(s): TSH, T4TOTAL, FREET4, T3FREE, THYROIDAB in the last 72 hours. Anemia Panel: No results for input(s): VITAMINB12, FOLATE, FERRITIN, TIBC, IRON, RETICCTPCT in the last 72 hours. Sepsis Labs: Recent Labs  Lab 05/18/17 0217 05/18/17 0531  PROCALCITON <0.10  --   LATICACIDVEN 1.1 0.8    Recent Results (from the past 240 hour(s))  Culture, blood (routine x 2)     Status: None (Preliminary result)   Collection Time: 05/17/17  7:36 PM  Result Value Ref Range Status   Specimen Description BLOOD BLOOD RIGHT FOREARM  Final   Special Requests   Final    IN PEDIATRIC BOTTLE Blood Culture results may not be optimal due to an excessive volume of blood received in culture bottles   Culture   Final    NO GROWTH 2 DAYS Performed at [facility] [address]  [place], [place] [address]    Report Status PENDING  Incomplete  Culture, blood (routine x 2)     Status: None (Preliminary result)   Collection Time: 05/17/17  7:41 PM  Result Value Ref Range Status   Specimen Description BLOOD BLOOD LEFT HAND  Final   Special Requests   Final    IN PEDIATRIC BOTTLE Blood Culture results may not be optimal due to an excessive volume of blood received in culture bottles   Culture   Final    NO GROWTH 2 DAYS Performed at [facility], [place] [address], [place], [place] [address]    Report Status PENDING  Incomplete  MRSA PCR Screening     Status: None   Collection Time: 05/18/17  1:03 AM  Result Value Ref Range Status   MRSA by PCR NEGATIVE NEGATIVE Final  Comment:        The GeneXpert MRSA Assay (FDA approved for NASAL specimens only), is one component of a comprehensive MRSA colonization surveillance program. It is not intended to diagnose MRSA infection nor to guide or monitor treatment for MRSA infections. Performed at [facility], [place] [address], [place], [place] [address]   Culture, sputum-assessment     Status: None   Collection Time: 05/19/17 10:23 AM  Result Value Ref Range Status   Specimen Description EXPECTORATED SPUTUM  Final   Special Requests NONE  Final   Sputum evaluation   Final    Sputum specimen not acceptable for testing.  Please recollect.   Gram Stain Report Called to,Read Back By and Verified With: RN Gregor Hams [phone] MLM Performed at [facility], [place] [address], [place], [place] [address]    Report Status 05/19/2017 FINAL  Final         Radiology Studies: Dg Chest 2 View  Result Date: 05/17/2017 CLINICAL DATA:  Generalized weakness and neck pain for 1 day. EXAM: CHEST - 2 VIEW COMPARISON:  04/30/2017. FINDINGS: Stable enlarged cardiac silhouette and post CABG changes. Interval mild patchy opacity at both lung bases. Small right pleural effusion, mildly increased. Suggestion of a small left  pleural effusions superimposed on the previously demonstrated chronic pleural thickening. The interstitial markings remain mildly prominent. Thoracic spine degenerative changes. IMPRESSION: 1. Interval mild patchy opacity at both lung bases, suspicious for pneumonia. 2. Mild increase in size of a small right pleural effusion with a probable interval small left pleural effusion. 3. Stable cardiomegaly and mild chronic interstitial lung disease. Electronically Signed   By: Claudie Revering M.D.   On: 05/17/2017 16:49   Ct Head Wo Contrast  Result Date: 05/17/2017 CLINICAL DATA:  Generalized weakness. Altered mental status since yesterday. EXAM: CT HEAD WITHOUT CONTRAST CT CERVICAL SPINE WITHOUT CONTRAST TECHNIQUE: Multidetector CT imaging of the head and cervical spine was performed following the standard protocol without intravenous contrast. Multiplanar CT image reconstructions of the cervical spine were also generated. COMPARISON:  Head CT dated 07/20/2016.  Chest CTA dated 04/30/2017. FINDINGS: CT HEAD FINDINGS Brain: Diffusely enlarged ventricles and subarachnoid spaces. No intracranial hemorrhage, mass lesion or CT evidence of acute infarction. Vascular: No hyperdense vessel or unexpected calcification. Skull: Normal. Negative for fracture or focal lesion. Sinuses/Orbits: Status post bilateral cataract extraction. Stable right ethmoid sinus retention cyst. Other: Deviation of the mid to anterior portion of the nasal septum to the right. CT CERVICAL SPINE FINDINGS Alignment: Normal. Skull base and vertebrae: No acute fracture. No primary bone lesion or focal pathologic process. Soft tissues and spinal canal: No prevertebral soft tissue swelling. Moderate-to-marked canal stenosis and moderate foraminal stenosis on the left at the C2-3 level due to posterior longitudinal ligament ossification and bridging osteophytes. Disc levels: Marked changes of DISH with diffuse anterior fusion throughout the cervical and upper  thoracic spine. Extensive atlantoaxial degenerative changes with sclerosis. Facet degenerative changes throughout the cervical spine. There is also posterior longitudinal ligament ossification and bone fusion, most pronounced at the C2-3 level, causing moderate to marked canal stenosis and moderate foraminal stenosis on the left. Upper chest: Previously demonstrated bilateral loculated pleural fluid/thickening. Other: Bilateral carotid artery calcification. IMPRESSION: 1. No acute intracranial abnormality. 2. No cervical spine fracture or subluxation. 3. No significant change in diffuse cerebral and cerebellar atrophy. 4. Extensive changes of DISH throughout the cervical upper thoracic spine with anterior and posterior bone fusion and associated  moderate-to-marked canal stenosis on the left at the C2-3 level. 5. Bilateral carotid artery atheromatous calcifications. 6. Marked atlantoaxial degenerative changes. 7. Stable bilateral loculated pleural fluid/thickening. Electronically Signed   By: Claudie Revering M.D.   On: 05/17/2017 16:59   Ct Angio Chest Pe W Or Wo Contrast  Result Date: 05/18/2017 CLINICAL DATA:  84 year old with chest pain. EXAM: CT ANGIOGRAPHY CHEST WITH CONTRAST TECHNIQUE: Multidetector CT imaging of the chest was performed using the standard protocol during bolus administration of intravenous contrast. Multiplanar CT image reconstructions and MIPs were obtained to evaluate the vascular anatomy. CONTRAST:  158mL ISOVUE-370 IOPAMIDOL (ISOVUE-370) INJECTION 76% COMPARISON:  Radiograph yesterday.  Chest CT PE protocol 04/30/2017 FINDINGS: Cardiovascular: There are no filling defects within the pulmonary arteries to suggest pulmonary embolus. Aortic atherosclerosis without aneurysm. Multi chamber cardiomegaly. No pericardial effusion. Post CABG with calcifications of the native coronary arteries. Mediastinum/Nodes: Calcified right hilar lymph nodes. Small mediastinal nodes not enlarged by size  criteria. No left hilar adenopathy. Tiny hiatal hernia. Lungs/Pleura: Chronic fluid in the left interlobar fissure. Chronic partially loculated fluid in the right lung adjacent to the minor fissure. Mild septal thickening. Geographic ground-glass opacities throughout both lungs. Bronchial thickening, most prominent in the lower lobes. Small bilateral pleural effusions, increased from prior CT. Upper Abdomen: No acute finding. Musculoskeletal: Chronic changes in the spine without acute abnormality. Post median sternotomy. Review of the MIP images confirms the above findings. IMPRESSION: 1. No pulmonary embolus. 2. Increased size of small bilateral pleural effusions from exam earlier this month. Chronic loculated fluid in the fissures. 3. Geographic ground-glass opacities throughout both lungs with mild bronchial thickening, likely pulmonary edema. 4. Bronchial thickening which can also be seen with pulmonary edema or bronchitic change. 5. Electronically Signed   By: Jeb Levering M.D.   On: 05/18/2017 06:32   Ct Cervical Spine Wo Contrast  Result Date: 05/17/2017 CLINICAL DATA:  Generalized weakness. Altered mental status since yesterday. EXAM: CT HEAD WITHOUT CONTRAST CT CERVICAL SPINE WITHOUT CONTRAST TECHNIQUE: Multidetector CT imaging of the head and cervical spine was performed following the standard protocol without intravenous contrast. Multiplanar CT image reconstructions of the cervical spine were also generated. COMPARISON:  Head CT dated 07/20/2016.  Chest CTA dated 04/30/2017. FINDINGS: CT HEAD FINDINGS Brain: Diffusely enlarged ventricles and subarachnoid spaces. No intracranial hemorrhage, mass lesion or CT evidence of acute infarction. Vascular: No hyperdense vessel or unexpected calcification. Skull: Normal. Negative for fracture or focal lesion. Sinuses/Orbits: Status post bilateral cataract extraction. Stable right ethmoid sinus retention cyst. Other: Deviation of the mid to anterior portion  of the nasal septum to the right. CT CERVICAL SPINE FINDINGS Alignment: Normal. Skull base and vertebrae: No acute fracture. No primary bone lesion or focal pathologic process. Soft tissues and spinal canal: No prevertebral soft tissue swelling. Moderate-to-marked canal stenosis and moderate foraminal stenosis on the left at the C2-3 level due to posterior longitudinal ligament ossification and bridging osteophytes. Disc levels: Marked changes of DISH with diffuse anterior fusion throughout the cervical and upper thoracic spine. Extensive atlantoaxial degenerative changes with sclerosis. Facet degenerative changes throughout the cervical spine. There is also posterior longitudinal ligament ossification and bone fusion, most pronounced at the C2-3 level, causing moderate to marked canal stenosis and moderate foraminal stenosis on the left. Upper chest: Previously demonstrated bilateral loculated pleural fluid/thickening. Other: Bilateral carotid artery calcification. IMPRESSION: 1. No acute intracranial abnormality. 2. No cervical spine fracture or subluxation. 3. No significant change in diffuse cerebral and cerebellar atrophy. 4.  Extensive changes of DISH throughout the cervical upper thoracic spine with anterior and posterior bone fusion and associated moderate-to-marked canal stenosis on the left at the C2-3 level. 5. Bilateral carotid artery atheromatous calcifications. 6. Marked atlantoaxial degenerative changes. 7. Stable bilateral loculated pleural fluid/thickening. Electronically Signed   By: Claudie Revering M.D.   On: 05/17/2017 16:59        Scheduled Meds: . alfuzosin  10 mg Oral Q breakfast  . amLODipine  5 mg Oral Daily  . aspirin  325 mg Oral Daily  . brimonidine  1 drop Both Eyes TID  . cycloSPORINE  1 drop Both Eyes Q12H  . enoxaparin (LOVENOX) injection  40 mg Subcutaneous Q24H  . furosemide  40 mg Oral Daily  . insulin aspart  0-5 Units Subcutaneous QHS  . insulin aspart  0-9 Units  Subcutaneous TID WC  . latanoprost  1 drop Left Eye QHS  . [START ON 05/20/2017] losartan  100 mg Oral Daily  . magnesium oxide  400 mg Oral Daily  . multivitamin with minerals  1 tablet Oral Daily  . pantoprazole  40 mg Oral Daily  . polyvinyl alcohol  1 drop Both Eyes BID  . simvastatin  20 mg Oral q1800   Continuous Infusions:    LOS: 2 days    Lexia Vandevender Tanna Furry, MD [facility] Pager [phone]  If 7PM-7AM, please contact night-coverage [email or web address] Password [facility] 05/19/2017, 2:24 PM

## 2017-05-20 ENCOUNTER — Inpatient Hospital Stay (HOSPITAL_COMMUNITY): Payer: Medicare Other

## 2017-05-20 LAB — BASIC METABOLIC PANEL
Anion gap: 10 (ref 5–15)
BUN: 35 mg/dL — AB (ref 6–20)
CHLORIDE: 102 mmol/L (ref 101–111)
CO2: 25 mmol/L (ref 22–32)
Calcium: 8.6 mg/dL — ABNORMAL LOW (ref 8.9–10.3)
Creatinine, Ser: 1.76 mg/dL — ABNORMAL HIGH (ref 0.61–1.24)
GFR calc Af Amer: 36 mL/min — ABNORMAL LOW (ref >60)
GFR calc non Af Amer: 31 mL/min — ABNORMAL LOW (ref >60)
GLUCOSE: 156 mg/dL — AB (ref 65–99)
POTASSIUM: 4.4 mmol/L (ref 3.5–5.1)
Sodium: 137 mmol/L (ref 135–145)

## 2017-05-20 LAB — GLUCOSE, CAPILLARY
GLUCOSE-CAPILLARY: 143 mg/dL — AB (ref 65–99)
Glucose-Capillary: 132 mg/dL — ABNORMAL HIGH (ref 65–99)
Glucose-Capillary: 172 mg/dL — ABNORMAL HIGH (ref 65–99)
Glucose-Capillary: 135 mg/dL — ABNORMAL HIGH (ref 65–99)

## 2017-05-20 MED ORDER — ALUM & MAG HYDROXIDE-SIMETH 200-200-20 MG/5ML PO SUSP: 30.0000 mL | ORAL | Status: DC | PRN

## 2017-05-20 MED ORDER — AMLODIPINE BESYLATE 10 MG PO TABS
10.0000 mg | ORAL_TABLET | Freq: Every day | ORAL | Status: DC
  Administered 2017-05-20: 10 mg via ORAL
  Filled 2017-05-20: qty 1

## 2017-05-20 MED ORDER — AMLODIPINE BESYLATE 5 MG PO TABS
5.0000 mg | ORAL_TABLET | Freq: Every day | ORAL | Status: DC
  Administered 2017-05-21: 5 mg via ORAL
  Filled 2017-05-20: qty 1

## 2017-05-20 MED ORDER — ENOXAPARIN SODIUM 30 MG/0.3ML ~~LOC~~ SOLN
30.0000 mg | SUBCUTANEOUS | Status: DC
  Administered 2017-05-21: 30 mg via SUBCUTANEOUS
  Filled 2017-05-20: qty 0.3

## 2017-05-20 NOTE — Progress Notes (Signed)
No charge.  Spoke with Dr. Carolin Sicks.  PMT consult cancelled.  Please reconsult if we can be of assistance.  Florentina Jenny, PA-C Palliative Medicine Pager: [phone]

## 2017-05-20 NOTE — Progress Notes (Signed)
CSW met with patient and daughter-in-law, [name], at bedside. Patient preferred [facility], and family preferred [facility]. [facility] does not have any beds available today or tomorrow. [facility] can take patient tomorrow, 05/21/17, if medically ready. CSW to follow and support with discharge.  Estanislado Emms, [place]

## 2017-05-20 NOTE — Progress Notes (Signed)
PROGRESS NOTE    Lawrence Warner  WLN:[id number] DOB: 11-04-1918 DOA: 05/17/2017 PCP: Clovia Cuff, MD   Brief Narrative: 84 year old male with history of hypertension, hyperlipidemia, diabetes, COPD on 2 L of oxygen, GERD, thrombocytopenia, coronary artery disease, CABG, diastolic chronic CHF, BPH, chronic kidney disease stage III, gait instability presented with generalized weakness, lightheadedness, chest pain, shortness of breath.  In the ER patient was found to have elevated BNP, positive d-dimer with elevated blood pressure of 215/113.  Assessment & Plan:   #Hypertensive emergency and acute on chronic diastolic congestive heart failure: -Off nitroglycerin drip.  Continue Norvasc.  Continue to hold Lasix and losartan because of elevated creatinine.  Blood pressure is acceptable.   Echocardiogram with EF of 50%.  Strict ins and outs.  Closely monitor heart rate and blood pressure.  #Chest pain:  CT chest negative for PE.  Patient has pleural effusion and pulmonary edema.  Doppler ultrasound of lower extremity ordered because of elevated d-dimer on admission.  Continue supportive care.  Mild elevation in troponin likely due to hypertensive emergency and history of CHF.  No chest pain today.  #Diet-controlled diabetes mellitus: Continue sliding scale.  Monitor blood sugar level.  #GERD on Protonix  #Chronic respiratory failure with hypoxia: On 2 L.  I do not think patient has pneumonia.  He is afebrile, no leukocytosis.  Repeat chest x-ray today with improvement in pulmonary edema.  Chest x-ray personally reviewed.  Currently on 2 L of oxygen.   #Acute kidney injury on chronic kidney disease stage III: Likely hemodynamically mediated.  UA on 3/23 unremarkable beta I will continue to hold losartan and Lasix.  Monitor BMP in the morning.  If renal function is a stable or better, and will benefit from resuming Lasix.  Avoid nephrotoxins.    #Generalized weakness and physical  deconditioning: Patient is 84 year old with comorbidities.  Continue supportive care and treatment.  Patient is DNR DNI.  PT OT recommended SNF.  Social worker consulted.  Waiting for bed.  #Leg cramps/right lower extremity positive for acute DVT involving a very small segment of a single posterior tibial vein.  This is below knee and very small in this elderly patient.  Continue supportive care, elevation of leg.  No anticoagulation recommended.  I relayed this information to the patient and he agreed.   DVT prophylaxis: Lovenox subcutaneous Code Status: DNR Family Communication: No family at bedside Disposition Plan: Likely discharge home in 1 to 2 days when bed is available.  Repeat BMP in the morning.    Consultants:   Palliative care  Procedures: None  Antimicrobials: None  Subjective: Seen and examined at bedside.  No new event.  Denies headache, denies no nausea vomiting chest pain shortness of breath. Objective: Vitals:   05/19/17 2300 05/19/17 2331 05/20/17 0548 05/20/17 0822  BP:  (!) 155/58 (!) 130/47 137/62  Pulse: 96 99 81 86  Resp: (!) 30 (!) 33 (!) 23   Temp:   97.7 F (36.5 C) 97.9 F (36.6 C)  TempSrc:   Oral Oral  SpO2: 90% 91% 95% 95%  Weight:   87.5 kg (192 lb 12.8 oz)   Height:        Intake/Output Summary (Last 24 hours) at 05/20/2017 1335 Last data filed at 05/20/2017 1000 Gross per 24 hour  Intake 776.08 ml  Output 200 ml  Net 576.08 ml   Filed Weights   05/18/17 0043 05/19/17 0429 05/20/17 0548  Weight: 90.1 kg (198 lb 10.2 oz) 88.5 kg (  195 lb 1.7 oz) 87.5 kg (192 lb 12.8 oz)    Examination:  General exam: Pleasant elderly male lying in bed Respiratory system: Clear bilateral, respiratory effort normal Cardiovascular system: Regular rate rhythm S1-S2 normal.. Gastrointestinal system: Abdomen is nondistended, soft and nontender. Normal bowel sounds heard. Central nervous system: Alert awake and following commands. Skin: No rashes, lesions  or ulcers    Data Reviewed: I have personally reviewed following labs and imaging studies  CBC: Recent Labs  Lab 05/17/17 1702 05/19/17 0421  WBC 8.0 7.2  NEUTROABS 5.6  --   HGB 12.4* 11.0*  HCT 39.7 35.5*  MCV 93.4 93.4  PLT 191 417   Basic Metabolic Panel: Recent Labs  Lab 05/17/17 1702 05/18/17 1533 05/19/17 0421 05/20/17 0400  NA 135 138 139 137  K 4.3 4.0 4.1 4.4  CL 102 104 103 102  CO2 24 24 24 25   GLUCOSE 109* 124* 118* 156*  BUN 16 16 23* 35*  CREATININE 1.14 1.28* 1.49* 1.76*  CALCIUM 9.0 8.8* 8.8* 8.6*  MG  --  1.7  --   --    GFR: Estimated Creatinine Clearance: 25.9 mL/min (A) (by C-G formula based on SCr of 1.76 mg/dL (H)). Liver Function Tests: Recent Labs  Lab 05/17/17 1702  AST 22  ALT 11*  ALKPHOS 63  BILITOT 0.5  PROT 7.3  ALBUMIN 3.4*   No results for input(s): LIPASE, AMYLASE in the last 168 hours. No results for input(s): AMMONIA in the last 168 hours. Coagulation Profile: No results for input(s): INR, PROTIME in the last 168 hours. Cardiac Enzymes: Recent Labs  Lab 05/17/17 2001 05/18/17 0217 05/18/17 0906  TROPONINI 0.03* 0.04* 0.03*   BNP (last 3 results) No results for input(s): PROBNP in the last 8760 hours. HbA1C: Recent Labs    05/18/17 0217  HGBA1C 6.7*   CBG: Recent Labs  Lab 05/19/17 1110 05/19/17 1703 05/19/17 2138 05/20/17 0749 05/20/17 1132  GLUCAP 189* 144* 154* 132* 172*   Lipid Profile: Recent Labs    05/18/17 0217  CHOL 92  HDL 25*  LDLCALC 47  TRIG 101  CHOLHDL 3.7   Thyroid Function Tests: No results for input(s): TSH, T4TOTAL, FREET4, T3FREE, THYROIDAB in the last 72 hours. Anemia Panel: No results for input(s): VITAMINB12, FOLATE, FERRITIN, TIBC, IRON, RETICCTPCT in the last 72 hours. Sepsis Labs: Recent Labs  Lab 05/18/17 0217 05/18/17 0531  PROCALCITON <0.10  --   LATICACIDVEN 1.1 0.8    Recent Results (from the past 240 hour(s))  Culture, blood (routine x 2)     Status:  None (Preliminary result)   Collection Time: 05/17/17  7:36 PM  Result Value Ref Range Status   Specimen Description BLOOD BLOOD RIGHT FOREARM  Final   Special Requests   Final    IN PEDIATRIC BOTTLE Blood Culture results may not be optimal due to an excessive volume of blood received in culture bottles   Culture   Final    NO GROWTH 3 DAYS Performed at [facility], [place] [address] [place], [place] [address]    Report Status PENDING  Incomplete  Culture, blood (routine x 2)     Status: None (Preliminary result)   Collection Time: 05/17/17  7:41 PM  Result Value Ref Range Status   Specimen Description BLOOD BLOOD LEFT HAND  Final   Special Requests   Final    IN PEDIATRIC BOTTLE Blood Culture results may not be optimal due to an excessive volume  of blood received in culture bottles   Culture   Final    NO GROWTH 3 DAYS Performed at [facility], [place] [address], [place], [place] [address]    Report Status PENDING  Incomplete  MRSA PCR Screening     Status: None   Collection Time: 05/18/17  1:03 AM  Result Value Ref Range Status   MRSA by PCR NEGATIVE NEGATIVE Final    Comment:        The GeneXpert MRSA Assay (FDA approved for NASAL specimens only), is one component of a comprehensive MRSA colonization surveillance program. It is not intended to diagnose MRSA infection nor to guide or monitor treatment for MRSA infections. Performed at [facility], [place] [address], [place], [place] [address]   Culture, sputum-assessment     Status: None   Collection Time: 05/19/17 10:23 AM  Result Value Ref Range Status   Specimen Description EXPECTORATED SPUTUM  Final   Special Requests NONE  Final   Sputum evaluation   Final    Sputum specimen not acceptable for testing.  Please recollect.   Gram Stain Report Called to,Read Back By and Verified With: RN Gregor Hams [phone] MLM Performed at [facility], [place] [address] [place], [place] [address]    Report Status  05/19/2017 FINAL  Final         Radiology Studies: Dg Chest Port 1 View  Result Date: 05/20/2017 CLINICAL DATA:  Shortness of breath. History of diabetes, hypertension, coronary artery disease, congestive heart failure, pneumonia. EXAM: PORTABLE CHEST 1 VIEW COMPARISON:  CT 05/18/2017. FINDINGS: Prior CABG. Cardiomegaly with normal pulmonary vascularity. Low lung volumes with mild basilar atelectasis. Interval near complete clearing of basilar interstitial edema. Slight nodularity noted in the left upper chest most likely related to residual small amount of fluid in the major fissure. Small left pleural effusion. No pneumothorax. IMPRESSION: 1. Prior CABG. Cardiomegaly. Interim near complete resolution of basilar interstitial pulmonary edema. Small residual left pleural effusion. 2.  Low lung volumes with basilar atelectasis. Electronically Signed   By: Marcello Moores  Register   On: 05/20/2017 09:12        Scheduled Meds: . alfuzosin  10 mg Oral Q breakfast  . [START ON 05/21/2017] amLODipine  5 mg Oral Daily  . aspirin  325 mg Oral Daily  . brimonidine  1 drop Both Eyes TID  . cycloSPORINE  1 drop Both Eyes Q12H  . [START ON 05/21/2017] enoxaparin (LOVENOX) injection  30 mg Subcutaneous Q24H  . insulin aspart  0-5 Units Subcutaneous QHS  . insulin aspart  0-9 Units Subcutaneous TID WC  . latanoprost  1 drop Left Eye QHS  . magnesium oxide  400 mg Oral Daily  . multivitamin with minerals  1 tablet Oral Daily  . pantoprazole  40 mg Oral Daily  . polyvinyl alcohol  1 drop Both Eyes BID  . simvastatin  20 mg Oral q1800   Continuous Infusions:    LOS: 3 days    Dron Tanna Furry, MD [facility] Pager [phone]  If 7PM-7AM, please contact night-coverage [email or web address] Password [email or web address] 05/20/2017, 1:35 PM

## 2017-05-21 DIAGNOSIS — J9611 Chronic respiratory failure with hypoxia: Secondary | ICD-10-CM | POA: Diagnosis not present

## 2017-05-21 DIAGNOSIS — J9610 Chronic respiratory failure, unspecified whether with hypoxia or hypercapnia: Secondary | ICD-10-CM | POA: Diagnosis not present

## 2017-05-21 DIAGNOSIS — N183 Chronic kidney disease, stage 3 (moderate): Secondary | ICD-10-CM | POA: Diagnosis not present

## 2017-05-21 DIAGNOSIS — N400 Enlarged prostate without lower urinary tract symptoms: Secondary | ICD-10-CM | POA: Diagnosis not present

## 2017-05-21 DIAGNOSIS — R7611 Nonspecific reaction to tuberculin skin test without active tuberculosis: Secondary | ICD-10-CM | POA: Diagnosis not present

## 2017-05-21 DIAGNOSIS — E538 Deficiency of other specified B group vitamins: Secondary | ICD-10-CM | POA: Diagnosis not present

## 2017-05-21 DIAGNOSIS — H4010X0 Unspecified open-angle glaucoma, stage unspecified: Secondary | ICD-10-CM | POA: Diagnosis not present

## 2017-05-21 DIAGNOSIS — I25810 Atherosclerosis of coronary artery bypass graft(s) without angina pectoris: Secondary | ICD-10-CM | POA: Diagnosis not present

## 2017-05-21 DIAGNOSIS — I5033 Acute on chronic diastolic (congestive) heart failure: Secondary | ICD-10-CM | POA: Diagnosis not present

## 2017-05-21 DIAGNOSIS — R52 Pain, unspecified: Secondary | ICD-10-CM | POA: Diagnosis not present

## 2017-05-21 DIAGNOSIS — I214 Non-ST elevation (NSTEMI) myocardial infarction: Secondary | ICD-10-CM | POA: Diagnosis not present

## 2017-05-21 DIAGNOSIS — N179 Acute kidney failure, unspecified: Secondary | ICD-10-CM | POA: Diagnosis not present

## 2017-05-21 DIAGNOSIS — E119 Type 2 diabetes mellitus without complications: Secondary | ICD-10-CM | POA: Diagnosis not present

## 2017-05-21 DIAGNOSIS — J449 Chronic obstructive pulmonary disease, unspecified: Secondary | ICD-10-CM | POA: Diagnosis not present

## 2017-05-21 DIAGNOSIS — M79606 Pain in leg, unspecified: Secondary | ICD-10-CM | POA: Diagnosis not present

## 2017-05-21 DIAGNOSIS — F064 Anxiety disorder due to known physiological condition: Secondary | ICD-10-CM | POA: Diagnosis not present

## 2017-05-21 DIAGNOSIS — R0981 Nasal congestion: Secondary | ICD-10-CM | POA: Diagnosis not present

## 2017-05-21 DIAGNOSIS — R079 Chest pain, unspecified: Secondary | ICD-10-CM | POA: Diagnosis not present

## 2017-05-21 DIAGNOSIS — I80201 Phlebitis and thrombophlebitis of unspecified deep vessels of right lower extremity: Secondary | ICD-10-CM | POA: Diagnosis not present

## 2017-05-21 DIAGNOSIS — I161 Hypertensive emergency: Secondary | ICD-10-CM | POA: Diagnosis not present

## 2017-05-21 DIAGNOSIS — R262 Difficulty in walking, not elsewhere classified: Secondary | ICD-10-CM | POA: Diagnosis not present

## 2017-05-21 DIAGNOSIS — I160 Hypertensive urgency: Secondary | ICD-10-CM | POA: Diagnosis not present

## 2017-05-21 DIAGNOSIS — E785 Hyperlipidemia, unspecified: Secondary | ICD-10-CM | POA: Diagnosis not present

## 2017-05-21 DIAGNOSIS — R918 Other nonspecific abnormal finding of lung field: Secondary | ICD-10-CM | POA: Diagnosis not present

## 2017-05-21 DIAGNOSIS — I509 Heart failure, unspecified: Secondary | ICD-10-CM | POA: Diagnosis not present

## 2017-05-21 DIAGNOSIS — K219 Gastro-esophageal reflux disease without esophagitis: Secondary | ICD-10-CM | POA: Diagnosis not present

## 2017-05-21 DIAGNOSIS — I10 Essential (primary) hypertension: Secondary | ICD-10-CM | POA: Diagnosis not present

## 2017-05-21 DIAGNOSIS — I5032 Chronic diastolic (congestive) heart failure: Secondary | ICD-10-CM | POA: Diagnosis not present

## 2017-05-21 DIAGNOSIS — M6281 Muscle weakness (generalized): Secondary | ICD-10-CM | POA: Diagnosis not present

## 2017-05-21 DIAGNOSIS — D693 Immune thrombocytopenic purpura: Secondary | ICD-10-CM | POA: Diagnosis not present

## 2017-05-21 DIAGNOSIS — Z23 Encounter for immunization: Secondary | ICD-10-CM | POA: Diagnosis not present

## 2017-05-21 DIAGNOSIS — R110 Nausea: Secondary | ICD-10-CM | POA: Diagnosis not present

## 2017-05-21 DIAGNOSIS — H6692 Otitis media, unspecified, left ear: Secondary | ICD-10-CM | POA: Diagnosis not present

## 2017-05-21 DIAGNOSIS — J9621 Acute and chronic respiratory failure with hypoxia: Secondary | ICD-10-CM | POA: Diagnosis not present

## 2017-05-21 DIAGNOSIS — Z111 Encounter for screening for respiratory tuberculosis: Secondary | ICD-10-CM | POA: Diagnosis not present

## 2017-05-21 DIAGNOSIS — K5900 Constipation, unspecified: Secondary | ICD-10-CM | POA: Diagnosis not present

## 2017-05-21 DIAGNOSIS — J181 Lobar pneumonia, unspecified organism: Secondary | ICD-10-CM | POA: Diagnosis not present

## 2017-05-21 DIAGNOSIS — H40059 Ocular hypertension, unspecified eye: Secondary | ICD-10-CM | POA: Diagnosis not present

## 2017-05-21 LAB — BASIC METABOLIC PANEL
ANION GAP: 10 (ref 5–15)
BUN: 41 mg/dL — AB (ref 6–20)
CO2: 23 mmol/L (ref 22–32)
Calcium: 8.7 mg/dL — ABNORMAL LOW (ref 8.9–10.3)
Chloride: 105 mmol/L (ref 101–111)
Creatinine, Ser: 1.63 mg/dL — ABNORMAL HIGH (ref 0.61–1.24)
GFR calc Af Amer: 39 mL/min — ABNORMAL LOW (ref >60)
GFR calc non Af Amer: 34 mL/min — ABNORMAL LOW (ref >60)
GLUCOSE: 146 mg/dL — AB (ref 65–99)
Potassium: 4.4 mmol/L (ref 3.5–5.1)
Sodium: 138 mmol/L (ref 135–145)

## 2017-05-21 LAB — GLUCOSE, CAPILLARY
Glucose-Capillary: 144 mg/dL — ABNORMAL HIGH (ref 65–99)
Glucose-Capillary: 173 mg/dL — ABNORMAL HIGH (ref 65–99)

## 2017-05-21 MED ORDER — NITROGLYCERIN 0.4 MG SL SUBL: 0.4000 mg | SUBLINGUAL_TABLET | SUBLINGUAL | 2 refills | Status: DC | PRN

## 2017-05-21 MED ORDER — ASPIRIN 325 MG PO TABS: 325.0000 mg | ORAL_TABLET | Freq: Every day | ORAL | 2 refills | Status: DC

## 2017-05-21 MED ORDER — ALBUTEROL SULFATE (2.5 MG/3ML) 0.083% IN NEBU: 2.5000 mg | INHALATION_SOLUTION | RESPIRATORY_TRACT | 12 refills | Status: DC | PRN

## 2017-05-21 MED ORDER — AMLODIPINE BESYLATE 5 MG PO TABS: 5.0000 mg | ORAL_TABLET | Freq: Every day | ORAL | 1 refills | Status: DC

## 2017-05-21 MED ORDER — GUAIFENESIN ER 600 MG PO TB12: 600.0000 mg | ORAL_TABLET | Freq: Two times a day (BID) | ORAL | 0 refills | Status: AC

## 2017-05-21 MED ORDER — ONDANSETRON 4 MG PO TBDP: 4.0000 mg | ORAL_TABLET | Freq: Three times a day (TID) | ORAL | 0 refills | Status: DC | PRN

## 2017-05-21 MED ORDER — FUROSEMIDE 20 MG PO TABS: 20.0000 mg | ORAL_TABLET | Freq: Every day | ORAL | 2 refills | Status: DC

## 2017-05-21 MED ORDER — ACETAMINOPHEN 325 MG PO TABS: 650.0000 mg | ORAL_TABLET | Freq: Every day | ORAL | 1 refills | Status: DC | PRN

## 2017-05-21 MED ORDER — OXYCODONE-ACETAMINOPHEN 5-325 MG PO TABS: 1.0000 | ORAL_TABLET | ORAL | 0 refills | Status: DC | PRN

## 2017-05-21 NOTE — Progress Notes (Signed)
[facility] ready to take patient today. Patient's son, [name] will meet patient at facility to help with paperwork. CSW paged MD and will support with discharge if medically ready today.  Lawrence Warner, [place]

## 2017-05-21 NOTE — Discharge Summary (Signed)
Lawrence Warner, is a 84 y.o. male  DOB 1918-04-15  MRN [id number].  Admission date:  05/17/2017  Admitting Physician  Ivor Costa, MD  Discharge Date:  05/21/2017   Primary MD  Clovia Cuff, MD  Recommendations for primary care physician for things to follow:   1)Repeat BMP/Renal panel on Monday 05/26/17 2) avoid excessive salt intake  Admission Diagnosis  Dizziness [R42] Generalized weakness [R53.1] Community acquired pneumonia of right lower lobe of lung ([place]) [J18.1] Hypertensive emergency [I16.1]   Discharge Diagnosis  Dizziness [R42] Generalized weakness [R53.1] Community acquired pneumonia of right lower lobe of lung ([place]) [J18.1] Hypertensive emergency [I16.1]   Principal Problem:   Hypertensive emergency Active Problems:   Diet-controlled diabetes mellitus ([place])   Hx of CABG   Essential hypertension   Chest pain   GERD (gastroesophageal reflux disease)   Chronic respiratory failure with hypoxia (HCC)   Dyslipidemia   Acute on chronic diastolic CHF (congestive heart failure) ([place])   CRI (chronic renal insufficiency), stage 3 (moderate) (HCC)   Generalized weakness      Past Medical History:  Diagnosis Date  . BPH (benign prostatic hypertrophy)   . Chronic heart failure ([place])   . Chronic renal insufficiency   . Coronary artery disease    CABG in 2000  . Diabetes type 2, controlled ([place])    Diet-controlled  . Diabetic neuropathy ([place])   . Dyslipidemia   . Hypertension   . ITP (idiopathic thrombocytopenic purpura)   . Osteoarthritis   . Pneumonia     Past Surgical History:  Procedure Laterality Date  . CHOLECYSTECTOMY    . CORONARY ARTERY BYPASS GRAFT  2000  . Right hip replacement  2014  . TONSILLECTOMY         HPI  from the history and physical done on the day of admission:    Patient coming from:  The patient is coming from home.  At baseline, pt is  independent for most of ADL.   Chief Complaint: generalized weakness, lightheadedness, chest pain, shortness breath  HPI: Lawrence Warner is a 84 y.o. male with medical history significant of Hypertension, hyperlipidemia, diabetes mellitus, COPD, on 2 L nasal cannula oxygen, GERD, thrombocytopenia, CAD, CABG, dCHF, BPH, CKD-3, gait instability, who presents with generalized weakness, lightheadedness, chest pain and SOB.  Per pt's son, pt has bee feeling weak recently, which has been progressively getting worse. He has dizziness and lightheadedness. He has generalized weakness, but no unilateral numbness or tingliness in extremities, no facial droop, slurred speech, vision change or hearing loss. Patient has a chest pain, which is located in left side of chest, sharp, 8 out of 10 in severity, nonradiating, pleuritic, aggravated by deep breath. No tenderness of the calf areas. Patient denies nausea, vomiting, diarrhea, abdominal pain, symptoms of UTI. Patient reports neck pain recently.   ED Course: pt was found to have WBC 8.0, troponin 0.03, BNP 1129, positive D-dimer 2.23, negative urinalysis, stable renal function, no tachypnea, Bp 215/113, oxygen saturation 95% on 2 L nasal  cannula oxygen, temperature normal. CT head is negative for acute intracranial abnormalities.    Hospital Course:    Brief Narrative: 84 year old male with history of hypertension, hyperlipidemia, diabetes, chronic hypoxic respiratory failure secondary to COPD on 2 L of oxygen, GERD, thrombocytopenia, coronary artery disease, CABG, diastolic chronic CHF, BPH, chronic kidney disease stage III, gait instability admitted on 05/17/2017 with generalized weakness, lightheadedness, chest pain, shortness of breath and found to have hypertensive emergency.  In the ER patient was found to have elevated BNP, positive d-dimer with elevated blood pressure of 215/113.  Assessment & Plan:   1)Hypertensive emergency leading to acute on  chronic diastolic congestive heart failure: Overall much improved BP control,-Off nitroglycerin drip.  Continue Norvasc 5 mg daily.  Restart Lasix at 20 mg daily, continue to hold losartan until renal function improves further.     Echocardiogram with EF > 50%, pulmonary edema is mostly resolved clinically and radiologically, please see chest x-ray from 05/20/2017   2)Chest pain/Leg Cramps-  d-dimer was elevated on admission, CT chest negative for PE.   Doppler ultrasound of lower extremity showed right lower extremity positive for acute DVT involving a very small segment of a single posterior tibial vein.  This is below knee and very small in this elderly patient.  Per Dr. Teresita Madura note patient agreed to no anticoagulation please see progress note dated 05/20/2017 .  I Verified with patient on 05/21/17  that he does not desire anticoagulation for this right lower extremity isolated DVT at this time .  Clinically no evidence of ACS, Mild elevation in troponin likely due to hypertensive emergency and history of  CHF.  No chest pain today.  Aspirin as prescribed  3)Diet-controlled diabetes mellitus: Hemoglobin A1c 6.7,, continue Modified diet   4)Acute on chronic chronic chronic respiratory failure with hypoxia: At baseline patient has chronic hypoxia secondary to underlying COPD, and diastolic dysfunction CHF, at baseline he used 2 L of oxygen at home prior to admission patient had worsening hypoxia initially due to pulmonary edema in the setting of hypertensive emergency, however oxygen requirement is back to his baseline of  2 L/min.  Clinically and radiologically no evidence for pneumonia, He is afebrile, no leukocytosis.  Repeat chest x-ray on 05/20/17  with mostly resolved pulmonary edema.    Continue supplemental oxygen, mucolytics and bronchodilators   5)Acute kidney injury on chronic kidney disease stage III: Likely hemodynamically mediated.  Overall improved, creatinine is down to 1.6 from 1.7,  creatinine was 1.5 in February 2019, UA on 05/17/17 unremarkable,  continue to hold losartan until renal function improves further, repeat BMP at facility on 05/26/2017, may restart Lasix at 20 mg daily ix.   Avoid nephrotoxins.    6)Generalized weakness and physical deconditioning: Patient is 84 year old with comorbidities.  Continue supportive care and treatment.  Patient is DNR DNI.  PT OT recommended SNF.  Social worker consulted.    Transfer to SNF for rehab  7) acute on chronic diastolic dysfunction CHF-last known EF over 50%, exacerbation of chronic diastolic CHF was triggered by hypertensive emergency, overall clinically and radiologically improved, restart Lasix at 20 mg daily.  Please see #1 and #2 above    Code Status: DNR Family Communication: No family at bedside Disposition Plan:  SNF   Procedures: None  Antimicrobials: None   Discharge Condition: stable  Follow UP  Contact information for after-discharge care    Destination    [facility] .   Service:  Skilled Nursing Contact information:  [address] [place] [phone]               Diet and Activity recommendation:  As advised  Discharge Instructions     Discharge Instructions    ([place]) Call MD:  Anytime you have any of the following symptoms: 1) 3 pound weight gain in 24 hours or 5 pounds in 1 week 2) shortness of breath, with or without a dry hacking cough 3) swelling in the hands, feet or stomach 4) if you have to sleep on extra pillows at night in order to breathe.   Complete by:  As directed    Call MD for:  difficulty breathing, headache or visual disturbances   Complete by:  As directed    Call MD for:  persistant dizziness or light-headedness   Complete by:  As directed    Call MD for:  persistant nausea and vomiting   Complete by:  As directed    Call MD for:  temperature >100.4   Complete by:  As directed    Diet - low sodium  heart healthy   Complete by:  As directed    Discharge instructions   Complete by:  As directed    1)Repeat BMP/Renal panel on Monday 05/26/17 2) avoid excessive salt intake   Increase activity slowly   Complete by:  As directed        Discharge Medications     Allergies as of 05/21/2017      Reactions   Alfuzosin Hcl Er Other (See Comments)   Confusion/Uroxatral   Imdur [isosorbide Dinitrate] Other (See Comments)   REACTION NOT RECALLED   Penicillins Hives, Other (See Comments)   Has patient had a PCN reaction causing immediate rash, facial/tongue/throat swelling, SOB or lightheadedness with hypotension: Yes Has patient had a PCN reaction causing severe rash involving mucus membranes or skin necrosis: Unknown Has patient had a PCN reaction that required hospitalization: Unknown Has patient had a PCN reaction occurring within the last 10 years: Unknown If all of the above answers are "NO", then may proceed with Cephalosporin use.   Tape Other (See Comments)   SKIN IS VERY THIN AND WILL TEAR EASILY      Medication List    STOP taking these medications   losartan 100 MG tablet Commonly known as:  COZAAR   predniSONE 10 MG tablet Commonly known as:  DELTASONE     TAKE these medications   acetaminophen 500 MG tablet Commonly known as:  TYLENOL Take 500-1,000 mg by mouth daily as needed for mild pain or headache. What changed:  Another medication with the same name was added. Make sure you understand how and when to take each.   acetaminophen 325 MG tablet Commonly known as:  TYLENOL Take 2 tablets (650 mg total) by mouth daily as needed for mild pain, fever or headache. What changed:  You were already taking a medication with the same name, and this prescription was added. Make sure you understand how and when to take each.   alfuzosin 10 MG 24 hr tablet Commonly known as:  UROXATRAL Take 10 mg by mouth daily with breakfast.   amLODipine 5 MG tablet Commonly known as:   NORVASC Take 1 tablet (5 mg total) by mouth daily. Start taking on:  05/22/2017   ARTIFICIAL TEARS PF OP Place 1 drop into both eyes 4 (four) times daily.   aspirin 325 MG tablet Take 1 tablet (325 mg total) by  mouth daily. With food   brimonidine 0.2 % ophthalmic solution Commonly known as:  ALPHAGAN Place 1 drop into both eyes 3 (three) times daily.   clobetasol cream 0.05 % Commonly known as:  TEMOVATE Apply 1 application topically 2 (two) times daily. To scalp   cycloSPORINE 0.05 % ophthalmic emulsion Commonly known as:  RESTASIS Place 1 drop into both eyes every 12 (twelve) hours.   docusate sodium 100 MG capsule Commonly known as:  COLACE Take 1 capsule (100 mg total) by mouth 2 (two) times daily as needed for mild constipation.   fluoruracil 0.5 % cream Commonly known as:  CARAC Apply 1 application topically at bedtime.   fluticasone 50 MCG/ACT nasal spray Commonly known as:  FLONASE Place 2 sprays into both nostrils 2 (two) times daily. For 7days   furosemide 20 MG tablet Commonly known as:  LASIX Take 1 tablet (20 mg total) by mouth daily.   guaiFENesin 600 MG 12 hr tablet Commonly known as:  MUCINEX Take 1 tablet (600 mg total) by mouth 2 (two) times daily for 10 days.   hydroxypropyl methylcellulose / hypromellose 2.5 % ophthalmic solution Commonly known as:  ISOPTO TEARS / GONIOVISC Place 1 drop into both eyes 3 (three) times daily as needed for dry eyes.   latanoprost 0.005 % ophthalmic solution Commonly known as:  XALATAN Place 1 drop into the left eye at bedtime.   magnesium oxide 400 MG tablet Commonly known as:  MAG-OX Take 400 mg daily by mouth.   multivitamin with minerals Tabs tablet Take 1 tablet by mouth daily.   nitroGLYCERIN 0.4 MG SL tablet Commonly known as:  NITROSTAT Place 0.4 mg under the tongue every 5 (five) minutes as needed for chest pain. What changed:  Another medication with the same name was added. Make sure you  understand how and when to take each.   nitroGLYCERIN 0.4 MG SL tablet Commonly known as:  NITROSTAT Place 1 tablet (0.4 mg total) under the tongue every 5 (five) minutes as needed for chest pain. What changed:  You were already taking a medication with the same name, and this prescription was added. Make sure you understand how and when to take each.   omeprazole 20 MG capsule Commonly known as:  PRILOSEC Take 1 capsule (20 mg total) by mouth daily before breakfast.   ondansetron 4 MG disintegrating tablet Commonly known as:  ZOFRAN ODT Take 1 tablet (4 mg total) by mouth every 8 (eight) hours as needed for nausea or vomiting.   oxyCODONE-acetaminophen 5-325 MG tablet Commonly known as:  PERCOCET/ROXICET Take 1 tablet by mouth every 4 (four) hours as needed for moderate pain.   PROAIR HFA 108 (90 Base) MCG/ACT inhaler Generic drug:  albuterol Inhale 1 puff into the lungs every 6 (six) hours as needed for wheezing. What changed:  Another medication with the same name was added. Make sure you understand how and when to take each.   albuterol (2.5 MG/3ML) 0.083% nebulizer solution Commonly known as:  PROVENTIL Take 3 mLs (2.5 mg total) by nebulization every 4 (four) hours as needed for wheezing or shortness of breath. What changed:  You were already taking a medication with the same name, and this prescription was added. Make sure you understand how and when to take each.   simvastatin 20 MG tablet Commonly known as:  ZOCOR Take 20 mg by mouth daily.       Major procedures and Radiology Reports - PLEASE review detailed and final  reports for all details, in brief -   Dg Chest 2 View  Result Date: 05/17/2017 CLINICAL DATA:  Generalized weakness and neck pain for 1 day. EXAM: CHEST - 2 VIEW COMPARISON:  04/30/2017. FINDINGS: Stable enlarged cardiac silhouette and post CABG changes. Interval mild patchy opacity at both lung bases. Small right pleural effusion, mildly increased.  Suggestion of a small left pleural effusions superimposed on the previously demonstrated chronic pleural thickening. The interstitial markings remain mildly prominent. Thoracic spine degenerative changes. IMPRESSION: 1. Interval mild patchy opacity at both lung bases, suspicious for pneumonia. 2. Mild increase in size of a small right pleural effusion with a probable interval small left pleural effusion. 3. Stable cardiomegaly and mild chronic interstitial lung disease. Electronically Signed   By: Claudie Revering M.D.   On: 05/17/2017 16:49   Ct Head Wo Contrast  Result Date: 05/17/2017 CLINICAL DATA:  Generalized weakness. Altered mental status since yesterday. EXAM: CT HEAD WITHOUT CONTRAST CT CERVICAL SPINE WITHOUT CONTRAST TECHNIQUE: Multidetector CT imaging of the head and cervical spine was performed following the standard protocol without intravenous contrast. Multiplanar CT image reconstructions of the cervical spine were also generated. COMPARISON:  Head CT dated 07/20/2016.  Chest CTA dated 04/30/2017. FINDINGS: CT HEAD FINDINGS Brain: Diffusely enlarged ventricles and subarachnoid spaces. No intracranial hemorrhage, mass lesion or CT evidence of acute infarction. Vascular: No hyperdense vessel or unexpected calcification. Skull: Normal. Negative for fracture or focal lesion. Sinuses/Orbits: Status post bilateral cataract extraction. Stable right ethmoid sinus retention cyst. Other: Deviation of the mid to anterior portion of the nasal septum to the right. CT CERVICAL SPINE FINDINGS Alignment: Normal. Skull base and vertebrae: No acute fracture. No primary bone lesion or focal pathologic process. Soft tissues and spinal canal: No prevertebral soft tissue swelling. Moderate-to-marked canal stenosis and moderate foraminal stenosis on the left at the C2-3 level due to posterior longitudinal ligament ossification and bridging osteophytes. Disc levels: Marked changes of DISH with diffuse anterior fusion  throughout the cervical and upper thoracic spine. Extensive atlantoaxial degenerative changes with sclerosis. Facet degenerative changes throughout the cervical spine. There is also posterior longitudinal ligament ossification and bone fusion, most pronounced at the C2-3 level, causing moderate to marked canal stenosis and moderate foraminal stenosis on the left. Upper chest: Previously demonstrated bilateral loculated pleural fluid/thickening. Other: Bilateral carotid artery calcification. IMPRESSION: 1. No acute intracranial abnormality. 2. No cervical spine fracture or subluxation. 3. No significant change in diffuse cerebral and cerebellar atrophy. 4. Extensive changes of DISH throughout the cervical upper thoracic spine with anterior and posterior bone fusion and associated moderate-to-marked canal stenosis on the left at the C2-3 level. 5. Bilateral carotid artery atheromatous calcifications. 6. Marked atlantoaxial degenerative changes. 7. Stable bilateral loculated pleural fluid/thickening. Electronically Signed   By: Claudie Revering M.D.   On: 05/17/2017 16:59   Ct Angio Chest Pe W Or Wo Contrast  Result Date: 05/18/2017 CLINICAL DATA:  84 year old with chest pain. EXAM: CT ANGIOGRAPHY CHEST WITH CONTRAST TECHNIQUE: Multidetector CT imaging of the chest was performed using the standard protocol during bolus administration of intravenous contrast. Multiplanar CT image reconstructions and MIPs were obtained to evaluate the vascular anatomy. CONTRAST:  149mL ISOVUE-370 IOPAMIDOL (ISOVUE-370) INJECTION 76% COMPARISON:  Radiograph yesterday.  Chest CT PE protocol 04/30/2017 FINDINGS: Cardiovascular: There are no filling defects within the pulmonary arteries to suggest pulmonary embolus. Aortic atherosclerosis without aneurysm. Multi chamber cardiomegaly. No pericardial effusion. Post CABG with calcifications of the native coronary arteries. Mediastinum/Nodes: Calcified right hilar  lymph nodes. Small mediastinal  nodes not enlarged by size criteria. No left hilar adenopathy. Tiny hiatal hernia. Lungs/Pleura: Chronic fluid in the left interlobar fissure. Chronic partially loculated fluid in the right lung adjacent to the minor fissure. Mild septal thickening. Geographic ground-glass opacities throughout both lungs. Bronchial thickening, most prominent in the lower lobes. Small bilateral pleural effusions, increased from prior CT. Upper Abdomen: No acute finding. Musculoskeletal: Chronic changes in the spine without acute abnormality. Post median sternotomy. Review of the MIP images confirms the above findings. IMPRESSION: 1. No pulmonary embolus. 2. Increased size of small bilateral pleural effusions from exam earlier this month. Chronic loculated fluid in the fissures. 3. Geographic ground-glass opacities throughout both lungs with mild bronchial thickening, likely pulmonary edema. 4. Bronchial thickening which can also be seen with pulmonary edema or bronchitic change. 5. Electronically Signed   By: Jeb Levering M.D.   On: 05/18/2017 06:32   Ct Angio Chest Pe W/cm &/or Wo Cm  Result Date: 04/30/2017 CLINICAL DATA:  Shortness of breath, lightheadedness EXAM: CT ANGIOGRAPHY CHEST WITH CONTRAST TECHNIQUE: Multidetector CT imaging of the chest was performed using the standard protocol during bolus administration of intravenous contrast. Multiplanar CT image reconstructions and MIPs were obtained to evaluate the vascular anatomy. CONTRAST:  1107mL ISOVUE-370 IOPAMIDOL (ISOVUE-370) INJECTION 76% COMPARISON:  03/14/2017, 07/22/2016 FINDINGS: Cardiovascular: Satisfactory opacification of the pulmonary arteries to the segmental level. No evidence of pulmonary embolism. Normal heart size. No pericardial effusion. Prior CABG. Mediastinum/Nodes: No enlarged mediastinal, hilar, or axillary lymph nodes. Thyroid gland, trachea, and esophagus demonstrate no significant findings. Lungs/Pleura: Small right pleural effusion. Bilateral  mild interstitial thickening. Small amount of loculated fluid in left major fissure. Small amount of loculated fluid along the right lateral chest wall. Mild atelectasis along the minor fissure. No pneumothorax. Upper Abdomen: No acute upper abdominal abnormality. Musculoskeletal: No acute osseous abnormality. No aggressive osseous lesion. Anterior bridging osteophytes of the thoracic spine with calcified disc spaces as can be seen with ankylosing spondylitis. Partial ankylosis of the posterior elements throughout the thoracic spine. Review of the MIP images confirms the above findings. IMPRESSION: 1. No evidence of pulmonary embolus. 2. Small right pleural effusion. Loculated pleural fluid along the right lateral chest wall and left major fissure. 3. Thoracic spine findings as can be seen with ankylosing spondylitis. Electronically Signed   By: Kathreen Devoid   On: 04/30/2017 14:05   Ct Cervical Spine Wo Contrast  Result Date: 05/17/2017 CLINICAL DATA:  Generalized weakness. Altered mental status since yesterday. EXAM: CT HEAD WITHOUT CONTRAST CT CERVICAL SPINE WITHOUT CONTRAST TECHNIQUE: Multidetector CT imaging of the head and cervical spine was performed following the standard protocol without intravenous contrast. Multiplanar CT image reconstructions of the cervical spine were also generated. COMPARISON:  Head CT dated 07/20/2016.  Chest CTA dated 04/30/2017. FINDINGS: CT HEAD FINDINGS Brain: Diffusely enlarged ventricles and subarachnoid spaces. No intracranial hemorrhage, mass lesion or CT evidence of acute infarction. Vascular: No hyperdense vessel or unexpected calcification. Skull: Normal. Negative for fracture or focal lesion. Sinuses/Orbits: Status post bilateral cataract extraction. Stable right ethmoid sinus retention cyst. Other: Deviation of the mid to anterior portion of the nasal septum to the right. CT CERVICAL SPINE FINDINGS Alignment: Normal. Skull base and vertebrae: No acute fracture. No  primary bone lesion or focal pathologic process. Soft tissues and spinal canal: No prevertebral soft tissue swelling. Moderate-to-marked canal stenosis and moderate foraminal stenosis on the left at the C2-3 level due to posterior longitudinal ligament ossification and  bridging osteophytes. Disc levels: Marked changes of DISH with diffuse anterior fusion throughout the cervical and upper thoracic spine. Extensive atlantoaxial degenerative changes with sclerosis. Facet degenerative changes throughout the cervical spine. There is also posterior longitudinal ligament ossification and bone fusion, most pronounced at the C2-3 level, causing moderate to marked canal stenosis and moderate foraminal stenosis on the left. Upper chest: Previously demonstrated bilateral loculated pleural fluid/thickening. Other: Bilateral carotid artery calcification. IMPRESSION: 1. No acute intracranial abnormality. 2. No cervical spine fracture or subluxation. 3. No significant change in diffuse cerebral and cerebellar atrophy. 4. Extensive changes of DISH throughout the cervical upper thoracic spine with anterior and posterior bone fusion and associated moderate-to-marked canal stenosis on the left at the C2-3 level. 5. Bilateral carotid artery atheromatous calcifications. 6. Marked atlantoaxial degenerative changes. 7. Stable bilateral loculated pleural fluid/thickening. Electronically Signed   By: Claudie Revering M.D.   On: 05/17/2017 16:59   Dg Chest Port 1 View  Result Date: 05/20/2017 CLINICAL DATA:  Shortness of breath. History of diabetes, hypertension, coronary artery disease, congestive heart failure, pneumonia. EXAM: PORTABLE CHEST 1 VIEW COMPARISON:  CT 05/18/2017. FINDINGS: Prior CABG. Cardiomegaly with normal pulmonary vascularity. Low lung volumes with mild basilar atelectasis. Interval near complete clearing of basilar interstitial edema. Slight nodularity noted in the left upper chest most likely related to residual small  amount of fluid in the major fissure. Small left pleural effusion. No pneumothorax. IMPRESSION: 1. Prior CABG. Cardiomegaly. Interim near complete resolution of basilar interstitial pulmonary edema. Small residual left pleural effusion. 2.  Low lung volumes with basilar atelectasis. Electronically Signed   By: Marcello Moores  Register   On: 05/20/2017 09:12   Dg Chest Port 1 View  Result Date: 04/30/2017 CLINICAL DATA:  Shortness breath. EXAM: PORTABLE CHEST 1 VIEW COMPARISON:  Chest x-ray dated March 21, 2017. FINDINGS: The cardiac silhouette remains at the upper limits of normal in size status post CABG. Normal pulmonary vascularity. Mild bibasilar atelectasis. Chronic blunting of the left costophrenic angle, unchanged. No focal consolidation, pleural effusion, or pneumothorax. No acute osseous abnormality. IMPRESSION: No active disease. Electronically Signed   By: Titus Dubin M.D.   On: 04/30/2017 10:19    Micro Results   Recent Results (from the past 240 hour(s))  Culture, blood (routine x 2)     Status: None (Preliminary result)   Collection Time: 05/17/17  7:36 PM  Result Value Ref Range Status   Specimen Description BLOOD BLOOD RIGHT FOREARM  Final   Special Requests   Final    IN PEDIATRIC BOTTLE Blood Culture results may not be optimal due to an excessive volume of blood received in culture bottles   Culture   Final    NO GROWTH 4 DAYS Performed at [facility], [place] [address], [place], [place] [address]    Report Status PENDING  Incomplete  Culture, blood (routine x 2)     Status: None (Preliminary result)   Collection Time: 05/17/17  7:41 PM  Result Value Ref Range Status   Specimen Description BLOOD BLOOD LEFT HAND  Final   Special Requests   Final    IN PEDIATRIC BOTTLE Blood Culture results may not be optimal due to an excessive volume of blood received in culture bottles   Culture   Final    NO GROWTH 4 DAYS Performed at [facility], [place] [address]  [place], [place] [address]    Report Status PENDING  Incomplete  MRSA PCR Screening  Status: None   Collection Time: 05/18/17  1:03 AM  Result Value Ref Range Status   MRSA by PCR NEGATIVE NEGATIVE Final    Comment:        The GeneXpert MRSA Assay (FDA approved for NASAL specimens only), is one component of a comprehensive MRSA colonization surveillance program. It is not intended to diagnose MRSA infection nor to guide or monitor treatment for MRSA infections. Performed at [facility], [place] [address] [place], [place] [address]   Culture, sputum-assessment     Status: None   Collection Time: 05/19/17 10:23 AM  Result Value Ref Range Status   Specimen Description EXPECTORATED SPUTUM  Final   Special Requests NONE  Final   Sputum evaluation   Final    Sputum specimen not acceptable for testing.  Please recollect.   Gram Stain Report Called to,Read Back By and Verified With: RN Gregor Hams [phone] MLM Performed at [facility], [place] [address], [place], [place] [address]    Report Status 05/19/2017 FINAL  Final       Today   Subjective    Dae Highley today has no new complaints, shortness of breath is better, fevers, no productive cough, no chest pains          Patient has been seen and examined prior to discharge   Objective   Blood pressure 127/64, pulse (!) 101, temperature 98.7 F (37.1 C), temperature source Oral, resp. rate (!) 35, height 5\' 7"  (1.702 m), weight 90 kg (198 lb 6.6 oz), SpO2 92 %.   Intake/Output Summary (Last 24 hours) at 05/21/2017 1235 Last data filed at 05/21/2017 1000 Gross per 24 hour  Intake 720 ml  Output 750 ml  Net -30 ml    Exam Gen:- Awake Alert,  In no apparent distress , able to speak in complete sentences HEENT:- .AT, No sclera icterus Nose-  2 L/min Neck-Supple Neck,No JVD,.  Lungs-diminished in bases, no significant rales,  CV- S1, S2 normal Abd-  +ve B.Sounds, Abd Soft, No tenderness,      Extremity/Skin:-Skin is warm and dry, pedal pulses are good  psych-affect is appropriate, oriented x3 Neuro-no new focal deficits, no tremors   Data Review   CBC w Diff:  Lab Results  Component Value Date   WBC 7.2 05/19/2017   HGB 11.0 (L) 05/19/2017   HGB 13.4 02/26/2017   HCT 35.5 (L) 05/19/2017   HCT 40.9 02/26/2017   PLT 152 05/19/2017   PLT 297 04/15/2017   PLT 176 02/26/2017   LYMPHOPCT 21 05/17/2017   LYMPHOPCT 21.2 02/26/2017   MONOPCT 8 05/17/2017   MONOPCT 13.1 02/26/2017   EOSPCT 1 05/17/2017   EOSPCT 1.4 02/26/2017   BASOPCT 0 05/17/2017   BASOPCT 0.7 02/26/2017    CMP:  Lab Results  Component Value Date   NA 138 05/21/2017   NA 141 12/03/2016   K 4.4 05/21/2017   K 4.5 12/03/2016   CL 105 05/21/2017   CO2 23 05/21/2017   CO2 24 12/03/2016   BUN 41 (H) 05/21/2017   BUN 31.8 (H) 12/03/2016   CREATININE 1.63 (H) 05/21/2017   CREATININE 1.51 (H) 04/15/2017   CREATININE 1.7 (H) 12/03/2016   PROT 7.3 05/17/2017   PROT 6.5 12/03/2016   ALBUMIN 3.4 (L) 05/17/2017   ALBUMIN 3.3 (L) 12/03/2016   BILITOT 0.5 05/17/2017   BILITOT 0.3 04/15/2017   BILITOT 0.36 12/03/2016   ALKPHOS 63 05/17/2017   ALKPHOS 76 12/03/2016  AST 22 05/17/2017   AST 18 04/15/2017   AST 20 12/03/2016   ALT 11 (L) 05/17/2017   ALT 9 04/15/2017   ALT 16 12/03/2016  .   Total Discharge time is about 33 minutes  Roxan Hockey M.D on 05/21/2017 at 12:35 PM  [facility]   Office  [phone]  Voice Recognition Viviann Spare dictation system was used to create this note, attempts have been made to correct errors. Please contact the author with questions and/or clarifications.

## 2017-05-21 NOTE — Progress Notes (Signed)
Physical Therapy Treatment Patient Details Name: Lawrence Warner MRN: [id number] DOB: 13-Mar-1918 Today's Date: 05/21/2017    History of Present Illness 84 year old male with history of hypertension, hyperlipidemia, diabetes, COPD on 2 L of oxygen, GERD, thrombocytopenia, coronary artery disease, CABG, diastolic chronic CHF, BPH, chronic kidney disease stage III, gait instability presented with generalized weakness, lightheadedness, chest pain, shortness of breath. Hypertensive emergency and acute on chronic diastolic congestive heart failure    PT Comments    Pt needed encouragement to participate, but agreed to a short session.  Emphasis was to get person up on his feet and sitting EOB for 5-10 minutes for "pulmonary toileting".     Follow Up Recommendations  SNF     Equipment Recommendations  Other (comment)    Recommendations for Other Services       Precautions / Restrictions Precautions Precautions: Fall    Mobility  Bed Mobility Overal bed mobility: Needs Assistance Bed Mobility: Rolling;Sidelying to Sit Rolling: Mod assist Sidelying to sit: Mod assist;+2 for physical assistance          Transfers Overall transfer level: Needs assistance   Transfers: Sit to/from Stand Sit to Stand: Mod assist;+2 physical assistance         General transfer comment: cues for hand placement  Ambulation/Gait Ambulation/Gait assistance: Mod assist;+2 physical assistance Ambulation Distance (Feet): 4 Feet Assistive device: Rolling walker (2 wheeled)       General Gait Details: side stepped up to [facility] before laying down.   Stairs            Wheelchair Mobility    Modified Rankin (Stroke Patients Only)       Balance Overall balance assessment: Needs assistance Sitting-balance support: Single extremity supported Sitting balance-Leahy Scale: Fair     Standing balance support: Bilateral upper extremity supported Standing balance-Leahy Scale: Poor                               Cognition Arousal/Alertness: Awake/alert Behavior During Therapy: WFL for tasks assessed/performed Overall Cognitive Status: Within Functional Limits for tasks assessed                                        Exercises      General Comments General comments (skin integrity, edema, etc.): sats improved for 91 to 94% sitting up EOB.  Pt's breathing appeared fairly labored, but again improved up at EOB      Pertinent Vitals/Pain Pain Assessment: No/denies pain    Home Living                      Prior Function            PT Goals (current goals can now be found in the care plan section) Acute Rehab PT Goals Patient Stated Goal: to take a nap PT Goal Formulation: With patient Time For Goal Achievement: 06/08/17 Potential to Achieve Goals: Fair Progress towards PT goals: Progressing toward goals    Frequency    Min 2X/week      PT Plan Current plan remains appropriate    Co-evaluation              AM-PAC PT "6 Clicks" Daily Activity  Outcome Measure  Difficulty turning over in bed (including adjusting bedclothes, sheets and blankets)?: Unable Difficulty moving from lying on back to  sitting on the side of the bed? : Unable Difficulty sitting down on and standing up from a chair with arms (e.g., wheelchair, bedside commode, etc,.)?: Unable Help needed moving to and from a bed to chair (including a wheelchair)?: A Lot Help needed walking in hospital room?: A Lot Help needed climbing 3-5 steps with a railing? : Total 6 Click Score: 8    End of Session Equipment Utilized During Treatment: Oxygen Activity Tolerance: Patient tolerated treatment well Patient left: in bed;with call bell/phone within reach;with family/visitor present Nurse Communication: Mobility status PT Visit Diagnosis: Muscle weakness (generalized) (M62.81);Other abnormalities of gait and mobility (R26.89)     Time: 4327-6147 PT Time  Calculation (min) (ACUTE ONLY): 24 min  Charges:  $Therapeutic Activity: 23-37 mins                    G Codes:       Jun 12, 2017  Donnella Sham, PT [phone]  (pager)   Tessie Fass Daylen Lipsky 06-12-2017, 2:24 PM

## 2017-05-21 NOTE — Care Management Important Message (Signed)
Important Message  Patient Details  Name: Lawrence Warner MRN: [id number] Date of Birth: [birth year]   Medicare Important Message Given:  Yes    Lawrence Warner P [place] 05/21/2017, 2:27 PM

## 2017-05-21 NOTE — Discharge Instructions (Signed)
1)Repeat BMP/Renal panel on Monday 05/26/17 2) avoid excessive salt intake

## 2017-05-21 NOTE — Progress Notes (Signed)
Patient will discharge to [facility]   Anticipated discharge date: 05/21/17 Family notified: Kionte Baumgardner, son Transportation by: PTAR  Nurse to call report to [phone].   CSW signing off.  Estanislado Emms, [place]  Clinical Social Worker

## 2017-05-21 NOTE — Progress Notes (Signed)
PTAR received patients discharge information and  Prescriptions. RN called report to Elmyra Ricks, Therapist, sports at [employer]. Patient IV was removed.

## 2017-05-21 NOTE — Clinical Social Work Placement (Signed)
   CLINICAL SOCIAL WORK PLACEMENT  NOTE  Date:  05/21/2017  Patient Details  Name: Lawrence Warner MRN: [id number] Date of Birth: February 08, 1919  Clinical Social Work is seeking post-discharge placement for this patient at the [place] level of care (*CSW will initial, date and re-position this form in  chart as items are completed):  Yes   Patient/family provided with [place] Work Department's list of facilities offering this level of care within the geographic area requested by the patient (or if unable, by the patient's family).  Yes   Patient/family informed of their freedom to choose among providers that offer the needed level of care, that participate in Medicare, Medicaid or managed care program needed by the patient, have an available bed and are willing to accept the patient.  Yes   Patient/family informed of [facility]'s ownership interest in [facility] and [facility], as well as of the fact that they are under no obligation to receive care at these facilities.  PASRR submitted to EDS on       PASRR number received on       Existing PASRR number confirmed on 05/19/17     FL2 transmitted to all facilities in geographic area requested by pt/family on 05/19/17     FL2 transmitted to all facilities within larger geographic area on       Patient informed that his/her managed care company has contracts with or will negotiate with certain facilities, including the following:  [facility]     Yes   Patient/family informed of bed offers received.  Patient chooses bed at [facility]     Physician recommends and patient chooses bed at      Patient to be transferred to [facility] on 05/21/17.  Patient to be transferred to facility by PTAR     Patient family notified on 05/21/17 of transfer.  Name of family member notified:  [name], daughter in law     PHYSICIAN Please prepare priority discharge summary, including  medications, Please prepare prescriptions, Please sign DNR     Additional Comment:    _______________________________________________ Estanislado Emms, LCSW 05/21/2017, 9:29 AM

## 2017-05-22 DIAGNOSIS — N400 Enlarged prostate without lower urinary tract symptoms: Secondary | ICD-10-CM | POA: Diagnosis not present

## 2017-05-22 DIAGNOSIS — N183 Chronic kidney disease, stage 3 (moderate): Secondary | ICD-10-CM | POA: Diagnosis not present

## 2017-05-22 DIAGNOSIS — R079 Chest pain, unspecified: Secondary | ICD-10-CM | POA: Diagnosis not present

## 2017-05-22 DIAGNOSIS — J9621 Acute and chronic respiratory failure with hypoxia: Secondary | ICD-10-CM | POA: Diagnosis not present

## 2017-05-22 DIAGNOSIS — M6281 Muscle weakness (generalized): Secondary | ICD-10-CM | POA: Diagnosis not present

## 2017-05-22 DIAGNOSIS — I5032 Chronic diastolic (congestive) heart failure: Secondary | ICD-10-CM | POA: Diagnosis not present

## 2017-05-22 DIAGNOSIS — J449 Chronic obstructive pulmonary disease, unspecified: Secondary | ICD-10-CM | POA: Diagnosis not present

## 2017-05-22 DIAGNOSIS — R52 Pain, unspecified: Secondary | ICD-10-CM | POA: Diagnosis not present

## 2017-05-22 DIAGNOSIS — E119 Type 2 diabetes mellitus without complications: Secondary | ICD-10-CM | POA: Diagnosis not present

## 2017-05-22 DIAGNOSIS — J181 Lobar pneumonia, unspecified organism: Secondary | ICD-10-CM | POA: Diagnosis not present

## 2017-05-22 DIAGNOSIS — I10 Essential (primary) hypertension: Secondary | ICD-10-CM | POA: Diagnosis not present

## 2017-05-22 DIAGNOSIS — M79606 Pain in leg, unspecified: Secondary | ICD-10-CM | POA: Diagnosis not present

## 2017-05-22 LAB — CULTURE, BLOOD (ROUTINE X 2)
Culture: NO GROWTH
Culture: NO GROWTH

## 2017-05-26 DIAGNOSIS — E119 Type 2 diabetes mellitus without complications: Secondary | ICD-10-CM | POA: Diagnosis not present

## 2017-05-26 DIAGNOSIS — N183 Chronic kidney disease, stage 3 (moderate): Secondary | ICD-10-CM | POA: Diagnosis not present

## 2017-05-26 DIAGNOSIS — I10 Essential (primary) hypertension: Secondary | ICD-10-CM | POA: Diagnosis not present

## 2017-05-26 DIAGNOSIS — M79606 Pain in leg, unspecified: Secondary | ICD-10-CM | POA: Diagnosis not present

## 2017-05-26 DIAGNOSIS — M6281 Muscle weakness (generalized): Secondary | ICD-10-CM | POA: Diagnosis not present

## 2017-05-26 DIAGNOSIS — J9621 Acute and chronic respiratory failure with hypoxia: Secondary | ICD-10-CM | POA: Diagnosis not present

## 2017-05-26 DIAGNOSIS — R079 Chest pain, unspecified: Secondary | ICD-10-CM | POA: Diagnosis not present

## 2017-05-26 DIAGNOSIS — J449 Chronic obstructive pulmonary disease, unspecified: Secondary | ICD-10-CM | POA: Diagnosis not present

## 2017-05-26 DIAGNOSIS — N400 Enlarged prostate without lower urinary tract symptoms: Secondary | ICD-10-CM | POA: Diagnosis not present

## 2017-05-26 DIAGNOSIS — I5032 Chronic diastolic (congestive) heart failure: Secondary | ICD-10-CM | POA: Diagnosis not present

## 2017-05-26 DIAGNOSIS — R52 Pain, unspecified: Secondary | ICD-10-CM | POA: Diagnosis not present

## 2017-05-26 DIAGNOSIS — J181 Lobar pneumonia, unspecified organism: Secondary | ICD-10-CM | POA: Diagnosis not present

## 2017-05-27 ENCOUNTER — Inpatient Hospital Stay: Payer: Medicare Other

## 2017-05-27 ENCOUNTER — Inpatient Hospital Stay: Payer: Medicare Other | Attending: Hematology

## 2017-05-27 DIAGNOSIS — I161 Hypertensive emergency: Secondary | ICD-10-CM | POA: Diagnosis not present

## 2017-05-27 DIAGNOSIS — D693 Immune thrombocytopenic purpura: Secondary | ICD-10-CM | POA: Diagnosis not present

## 2017-05-27 DIAGNOSIS — R52 Pain, unspecified: Secondary | ICD-10-CM | POA: Diagnosis not present

## 2017-05-27 DIAGNOSIS — J9610 Chronic respiratory failure, unspecified whether with hypoxia or hypercapnia: Secondary | ICD-10-CM | POA: Diagnosis not present

## 2017-05-27 DIAGNOSIS — M6281 Muscle weakness (generalized): Secondary | ICD-10-CM | POA: Diagnosis not present

## 2017-05-27 DIAGNOSIS — N183 Chronic kidney disease, stage 3 (moderate): Secondary | ICD-10-CM | POA: Diagnosis not present

## 2017-05-27 DIAGNOSIS — E119 Type 2 diabetes mellitus without complications: Secondary | ICD-10-CM | POA: Diagnosis not present

## 2017-05-27 DIAGNOSIS — I5033 Acute on chronic diastolic (congestive) heart failure: Secondary | ICD-10-CM | POA: Diagnosis not present

## 2017-05-27 DIAGNOSIS — N179 Acute kidney failure, unspecified: Secondary | ICD-10-CM | POA: Diagnosis not present

## 2017-05-27 DIAGNOSIS — E538 Deficiency of other specified B group vitamins: Secondary | ICD-10-CM | POA: Diagnosis not present

## 2017-05-27 DIAGNOSIS — J449 Chronic obstructive pulmonary disease, unspecified: Secondary | ICD-10-CM | POA: Diagnosis not present

## 2017-05-27 DIAGNOSIS — Z95828 Presence of other vascular implants and grafts: Secondary | ICD-10-CM

## 2017-05-27 DIAGNOSIS — I10 Essential (primary) hypertension: Secondary | ICD-10-CM | POA: Diagnosis not present

## 2017-05-27 DIAGNOSIS — Z23 Encounter for immunization: Secondary | ICD-10-CM

## 2017-05-27 DIAGNOSIS — I214 Non-ST elevation (NSTEMI) myocardial infarction: Secondary | ICD-10-CM | POA: Diagnosis not present

## 2017-05-27 DIAGNOSIS — I80201 Phlebitis and thrombophlebitis of unspecified deep vessels of right lower extremity: Secondary | ICD-10-CM | POA: Diagnosis not present

## 2017-05-27 LAB — CBC WITH DIFFERENTIAL/PLATELET
BASOS ABS: 0 10*3/uL (ref 0.0–0.1)
Basophils Relative: 0 %
EOS PCT: 4 %
Eosinophils Absolute: 0.2 10*3/uL (ref 0.0–0.5)
HCT: 34.6 % — ABNORMAL LOW (ref 38.4–49.9)
Hemoglobin: 10.8 g/dL — ABNORMAL LOW (ref 13.0–17.1)
LYMPHS ABS: 0.9 10*3/uL (ref 0.9–3.3)
Lymphocytes Relative: 15 %
MCH: 29 pg (ref 27.2–33.4)
MCHC: 31.2 g/dL — AB (ref 32.0–36.0)
MCV: 93 fL (ref 79.3–98.0)
MONO ABS: 0.8 10*3/uL (ref 0.1–0.9)
MONOS PCT: 14 %
Neutro Abs: 3.9 10*3/uL (ref 1.5–6.5)
Neutrophils Relative %: 67 %
PLATELETS: 222 10*3/uL (ref 140–400)
RBC: 3.72 MIL/uL — ABNORMAL LOW (ref 4.20–5.82)
RDW: 13.8 % (ref 11.0–14.6)
WBC: 5.8 10*3/uL (ref 4.0–10.3)

## 2017-05-27 MED ORDER — ROMIPLOSTIM 250 MCG ~~LOC~~ SOLR
50.0000 ug | SUBCUTANEOUS | Status: DC
  Administered 2017-05-27: 50 ug via SUBCUTANEOUS
  Filled 2017-05-27: qty 0.1

## 2017-05-27 MED ORDER — CYANOCOBALAMIN 1000 MCG/ML IJ SOLN
INTRAMUSCULAR | Status: AC
  Filled 2017-05-27: qty 1

## 2017-05-27 NOTE — Patient Instructions (Signed)
Romiplostim injection What is this medicine? ROMIPLOSTIM ([place] mi PLOE stim) helps your body make more platelets. This medicine is used to treat low platelets caused by chronic idiopathic thrombocytopenic purpura (ITP). This medicine may be used for other purposes; ask your health care provider or pharmacist if you have questions. What should I tell my health care provider before I take this medicine? They need to know if you have any of these conditions: -cancer or myelodysplastic syndrome -low blood counts, like low white cell, platelet, or red cell counts -take medicines that treat or prevent blood clots -an unusual or allergic reaction to romiplostim, mannitol, other medicines, foods, dyes, or preservatives -pregnant or trying to get pregnant -breast-feeding How should I use this medicine? This medicine is for injection under the skin. It is given by a health care professional in a hospital or clinic setting. A special MedGuide will be given to you before your injection. Read this information carefully each time. Talk to your pediatrician regarding the use of this medicine in children. Special care may be needed. Overdosage: If you think you have taken too much of this medicine contact a poison control center or emergency room at once. NOTE: This medicine is only for you. Do not share this medicine with others. What if I miss a dose? It is important not to miss your dose. Call your doctor or health care professional if you are unable to keep an appointment. What may interact with this medicine? Interactions are not expected. This list may not describe all possible interactions. Give your health care provider a list of all the medicines, herbs, non-prescription drugs, or dietary supplements you use. Also tell them if you smoke, drink alcohol, or use illegal drugs. Some items may interact with your medicine. What should I watch for while using this medicine? Your condition will be monitored  carefully while you are receiving this medicine. Visit your prescriber or health care professional for regular checks on your progress and for the needed blood tests. It is important to keep all appointments. What side effects may I notice from receiving this medicine? Side effects that you should report to your doctor or health care professional as soon as possible: -allergic reactions like skin rash, itching or hives, swelling of the face, lips, or tongue -shortness of breath, chest pain, swelling in a leg -unusual bleeding or bruising Side effects that usually do not require medical attention (report to your doctor or health care professional if they continue or are bothersome): -dizziness -headache -muscle aches -pain in arms and legs -stomach pain -trouble sleeping This list may not describe all possible side effects. Call your doctor for medical advice about side effects. You may report side effects to FDA at 1-800-FDA-1088. Where should I keep my medicine? This drug is given in a hospital or clinic and will not be stored at home. NOTE: This sheet is a summary. It may not cover all possible information. If you have questions about this medicine, talk to your doctor, pharmacist, or health care provider.    2016, Elsevier/Gold Standard. (2007-10-12 15:13:04) Cyanocobalamin, Vitamin B12 injection What is this medicine? CYANOCOBALAMIN (sye an oh koe BAL a min) is a man made form of vitamin B12. Vitamin B12 is used in the growth of healthy blood cells, nerve cells, and proteins in the body. It also helps with the metabolism of fats and carbohydrates. This medicine is used to treat people who can not absorb vitamin B12. This medicine may be used for other   purposes; ask your health care provider or pharmacist if you have questions. What should I tell my health care provider before I take this medicine? They need to know if you have any of these conditions: -kidney disease -Leber's  disease -megaloblastic anemia -an unusual or allergic reaction to cyanocobalamin, cobalt, other medicines, foods, dyes, or preservatives -pregnant or trying to get pregnant -breast-feeding How should I use this medicine? This medicine is injected into a muscle or deeply under the skin. It is usually given by a health care professional in a clinic or doctor's office. However, your doctor may teach you how to inject yourself. Follow all instructions. Talk to your pediatrician regarding the use of this medicine in children. Special care may be needed. Overdosage: If you think you have taken too much of this medicine contact a poison control center or emergency room at once. NOTE: This medicine is only for you. Do not share this medicine with others. What if I miss a dose? If you are given your dose at a clinic or doctor's office, call to reschedule your appointment. If you give your own injections and you miss a dose, take it as soon as you can. If it is almost time for your next dose, take only that dose. Do not take double or extra doses. What may interact with this medicine? -colchicine -heavy alcohol intake This list may not describe all possible interactions. Give your health care provider a list of all the medicines, herbs, non-prescription drugs, or dietary supplements you use. Also tell them if you smoke, drink alcohol, or use illegal drugs. Some items may interact with your medicine. What should I watch for while using this medicine? Visit your doctor or health care professional regularly. You may need blood work done while you are taking this medicine. You may need to follow a special diet. Talk to your doctor. Limit your alcohol intake and avoid smoking to get the best benefit. What side effects may I notice from receiving this medicine? Side effects that you should report to your doctor or health care professional as soon as possible: -allergic reactions like skin rash, itching or  hives, swelling of the face, lips, or tongue -blue tint to skin -chest tightness, pain -difficulty breathing, wheezing -dizziness -red, swollen painful area on the leg Side effects that usually do not require medical attention (report to your doctor or health care professional if they continue or are bothersome): -diarrhea -headache This list may not describe all possible side effects. Call your doctor for medical advice about side effects. You may report side effects to FDA at 1-800-FDA-1088. Where should I keep my medicine? Keep out of the reach of children. Store at room temperature between 15 and 30 degrees C (59 and 85 degrees F). Protect from light. Throw away any unused medicine after the expiration date. NOTE: This sheet is a summary. It may not cover all possible information. If you have questions about this medicine, talk to your doctor, pharmacist, or health care provider.    2016, Elsevier/Gold Standard. (2007-05-25 22:10:20)  

## 2017-05-29 DIAGNOSIS — I214 Non-ST elevation (NSTEMI) myocardial infarction: Secondary | ICD-10-CM | POA: Diagnosis not present

## 2017-05-29 DIAGNOSIS — M6281 Muscle weakness (generalized): Secondary | ICD-10-CM | POA: Diagnosis not present

## 2017-05-29 DIAGNOSIS — R52 Pain, unspecified: Secondary | ICD-10-CM | POA: Diagnosis not present

## 2017-05-29 DIAGNOSIS — J449 Chronic obstructive pulmonary disease, unspecified: Secondary | ICD-10-CM | POA: Diagnosis not present

## 2017-05-29 DIAGNOSIS — I5033 Acute on chronic diastolic (congestive) heart failure: Secondary | ICD-10-CM | POA: Diagnosis not present

## 2017-05-29 DIAGNOSIS — N183 Chronic kidney disease, stage 3 (moderate): Secondary | ICD-10-CM | POA: Diagnosis not present

## 2017-05-29 DIAGNOSIS — N179 Acute kidney failure, unspecified: Secondary | ICD-10-CM | POA: Diagnosis not present

## 2017-05-29 DIAGNOSIS — I161 Hypertensive emergency: Secondary | ICD-10-CM | POA: Diagnosis not present

## 2017-05-29 DIAGNOSIS — H4010X0 Unspecified open-angle glaucoma, stage unspecified: Secondary | ICD-10-CM | POA: Diagnosis not present

## 2017-05-29 DIAGNOSIS — J9610 Chronic respiratory failure, unspecified whether with hypoxia or hypercapnia: Secondary | ICD-10-CM | POA: Diagnosis not present

## 2017-05-29 DIAGNOSIS — E119 Type 2 diabetes mellitus without complications: Secondary | ICD-10-CM | POA: Diagnosis not present

## 2017-05-29 DIAGNOSIS — I80201 Phlebitis and thrombophlebitis of unspecified deep vessels of right lower extremity: Secondary | ICD-10-CM | POA: Diagnosis not present

## 2017-06-03 DIAGNOSIS — H6692 Otitis media, unspecified, left ear: Secondary | ICD-10-CM | POA: Diagnosis not present

## 2017-06-05 DIAGNOSIS — J449 Chronic obstructive pulmonary disease, unspecified: Secondary | ICD-10-CM | POA: Diagnosis not present

## 2017-06-05 DIAGNOSIS — M6281 Muscle weakness (generalized): Secondary | ICD-10-CM | POA: Diagnosis not present

## 2017-06-05 DIAGNOSIS — I5033 Acute on chronic diastolic (congestive) heart failure: Secondary | ICD-10-CM | POA: Diagnosis not present

## 2017-06-05 DIAGNOSIS — N183 Chronic kidney disease, stage 3 (moderate): Secondary | ICD-10-CM | POA: Diagnosis not present

## 2017-06-05 DIAGNOSIS — E119 Type 2 diabetes mellitus without complications: Secondary | ICD-10-CM | POA: Diagnosis not present

## 2017-06-05 DIAGNOSIS — I161 Hypertensive emergency: Secondary | ICD-10-CM | POA: Diagnosis not present

## 2017-06-05 DIAGNOSIS — N179 Acute kidney failure, unspecified: Secondary | ICD-10-CM | POA: Diagnosis not present

## 2017-06-05 DIAGNOSIS — I214 Non-ST elevation (NSTEMI) myocardial infarction: Secondary | ICD-10-CM | POA: Diagnosis not present

## 2017-06-05 DIAGNOSIS — J9610 Chronic respiratory failure, unspecified whether with hypoxia or hypercapnia: Secondary | ICD-10-CM | POA: Diagnosis not present

## 2017-06-05 DIAGNOSIS — I10 Essential (primary) hypertension: Secondary | ICD-10-CM | POA: Diagnosis not present

## 2017-06-05 DIAGNOSIS — H4010X0 Unspecified open-angle glaucoma, stage unspecified: Secondary | ICD-10-CM | POA: Diagnosis not present

## 2017-06-05 DIAGNOSIS — I80201 Phlebitis and thrombophlebitis of unspecified deep vessels of right lower extremity: Secondary | ICD-10-CM | POA: Diagnosis not present

## 2017-06-06 DIAGNOSIS — R0902 Hypoxemia: Secondary | ICD-10-CM | POA: Diagnosis not present

## 2017-06-06 DIAGNOSIS — E119 Type 2 diabetes mellitus without complications: Secondary | ICD-10-CM | POA: Diagnosis not present

## 2017-06-06 DIAGNOSIS — I10 Essential (primary) hypertension: Secondary | ICD-10-CM | POA: Diagnosis not present

## 2017-06-06 DIAGNOSIS — I5022 Chronic systolic (congestive) heart failure: Secondary | ICD-10-CM | POA: Diagnosis not present

## 2017-06-06 DIAGNOSIS — Z0000 Encounter for general adult medical examination without abnormal findings: Secondary | ICD-10-CM | POA: Diagnosis not present

## 2017-06-10 ENCOUNTER — Inpatient Hospital Stay: Payer: Medicare Other

## 2017-06-10 VITALS — BP 140/65 | HR 94 | Temp 97.8°F | Resp 20

## 2017-06-10 DIAGNOSIS — D693 Immune thrombocytopenic purpura: Secondary | ICD-10-CM

## 2017-06-10 DIAGNOSIS — Z95828 Presence of other vascular implants and grafts: Secondary | ICD-10-CM

## 2017-06-10 DIAGNOSIS — E538 Deficiency of other specified B group vitamins: Secondary | ICD-10-CM | POA: Diagnosis not present

## 2017-06-10 DIAGNOSIS — Z23 Encounter for immunization: Secondary | ICD-10-CM

## 2017-06-10 LAB — CBC WITH DIFFERENTIAL/PLATELET
Basophils Absolute: 0 10*3/uL (ref 0.0–0.1)
Basophils Relative: 0 %
EOS ABS: 0.1 10*3/uL (ref 0.0–0.5)
Eosinophils Relative: 1 %
HEMATOCRIT: 35.3 % — AB (ref 38.4–49.9)
HEMOGLOBIN: 11 g/dL — AB (ref 13.0–17.1)
LYMPHS ABS: 0.9 10*3/uL (ref 0.9–3.3)
LYMPHS PCT: 17 %
MCH: 28.9 pg (ref 27.2–33.4)
MCHC: 31.2 g/dL — AB (ref 32.0–36.0)
MCV: 92.7 fL (ref 79.3–98.0)
Monocytes Absolute: 0.7 10*3/uL (ref 0.1–0.9)
Monocytes Relative: 12 %
NEUTROS ABS: 3.9 10*3/uL (ref 1.5–6.5)
NEUTROS PCT: 70 %
Platelets: 198 10*3/uL (ref 140–400)
RBC: 3.81 MIL/uL — AB (ref 4.20–5.82)
RDW: 14.9 % — ABNORMAL HIGH (ref 11.0–14.6)
WBC: 5.6 10*3/uL (ref 4.0–10.3)

## 2017-06-10 MED ORDER — ROMIPLOSTIM 250 MCG ~~LOC~~ SOLR
50.0000 ug | SUBCUTANEOUS | Status: DC
Start: 2017-06-10 — End: 2017-06-10
  Administered 2017-06-10: 50 ug via SUBCUTANEOUS
  Filled 2017-06-10: qty 0.1

## 2017-06-10 MED ORDER — CYANOCOBALAMIN 1000 MCG/ML IJ SOLN
INTRAMUSCULAR | Status: AC
  Filled 2017-06-10: qty 1

## 2017-06-12 DIAGNOSIS — J322 Chronic ethmoidal sinusitis: Secondary | ICD-10-CM | POA: Diagnosis not present

## 2017-06-12 DIAGNOSIS — H6692 Otitis media, unspecified, left ear: Secondary | ICD-10-CM | POA: Diagnosis not present

## 2017-06-12 DIAGNOSIS — J9611 Chronic respiratory failure with hypoxia: Secondary | ICD-10-CM | POA: Diagnosis not present

## 2017-06-12 DIAGNOSIS — J040 Acute laryngitis: Secondary | ICD-10-CM | POA: Diagnosis not present

## 2017-06-12 DIAGNOSIS — I2510 Atherosclerotic heart disease of native coronary artery without angina pectoris: Secondary | ICD-10-CM | POA: Diagnosis not present

## 2017-06-12 DIAGNOSIS — I214 Non-ST elevation (NSTEMI) myocardial infarction: Secondary | ICD-10-CM | POA: Diagnosis not present

## 2017-06-12 DIAGNOSIS — E1122 Type 2 diabetes mellitus with diabetic chronic kidney disease: Secondary | ICD-10-CM | POA: Diagnosis not present

## 2017-06-12 DIAGNOSIS — J342 Deviated nasal septum: Secondary | ICD-10-CM | POA: Diagnosis not present

## 2017-06-12 DIAGNOSIS — J320 Chronic maxillary sinusitis: Secondary | ICD-10-CM | POA: Diagnosis not present

## 2017-06-12 DIAGNOSIS — I130 Hypertensive heart and chronic kidney disease with heart failure and stage 1 through stage 4 chronic kidney disease, or unspecified chronic kidney disease: Secondary | ICD-10-CM | POA: Diagnosis not present

## 2017-06-12 DIAGNOSIS — I5032 Chronic diastolic (congestive) heart failure: Secondary | ICD-10-CM | POA: Diagnosis not present

## 2017-06-12 DIAGNOSIS — H903 Sensorineural hearing loss, bilateral: Secondary | ICD-10-CM | POA: Diagnosis not present

## 2017-06-16 DIAGNOSIS — J9611 Chronic respiratory failure with hypoxia: Secondary | ICD-10-CM | POA: Diagnosis not present

## 2017-06-16 DIAGNOSIS — I5032 Chronic diastolic (congestive) heart failure: Secondary | ICD-10-CM | POA: Diagnosis not present

## 2017-06-16 DIAGNOSIS — E1122 Type 2 diabetes mellitus with diabetic chronic kidney disease: Secondary | ICD-10-CM | POA: Diagnosis not present

## 2017-06-16 DIAGNOSIS — I2510 Atherosclerotic heart disease of native coronary artery without angina pectoris: Secondary | ICD-10-CM | POA: Diagnosis not present

## 2017-06-16 DIAGNOSIS — I130 Hypertensive heart and chronic kidney disease with heart failure and stage 1 through stage 4 chronic kidney disease, or unspecified chronic kidney disease: Secondary | ICD-10-CM | POA: Diagnosis not present

## 2017-06-16 DIAGNOSIS — I214 Non-ST elevation (NSTEMI) myocardial infarction: Secondary | ICD-10-CM | POA: Diagnosis not present

## 2017-06-23 DIAGNOSIS — E1122 Type 2 diabetes mellitus with diabetic chronic kidney disease: Secondary | ICD-10-CM | POA: Diagnosis not present

## 2017-06-23 DIAGNOSIS — I2510 Atherosclerotic heart disease of native coronary artery without angina pectoris: Secondary | ICD-10-CM | POA: Diagnosis not present

## 2017-06-23 DIAGNOSIS — I130 Hypertensive heart and chronic kidney disease with heart failure and stage 1 through stage 4 chronic kidney disease, or unspecified chronic kidney disease: Secondary | ICD-10-CM | POA: Diagnosis not present

## 2017-06-23 DIAGNOSIS — I5032 Chronic diastolic (congestive) heart failure: Secondary | ICD-10-CM | POA: Diagnosis not present

## 2017-06-23 DIAGNOSIS — I214 Non-ST elevation (NSTEMI) myocardial infarction: Secondary | ICD-10-CM | POA: Diagnosis not present

## 2017-06-23 DIAGNOSIS — J9611 Chronic respiratory failure with hypoxia: Secondary | ICD-10-CM | POA: Diagnosis not present

## 2017-06-24 ENCOUNTER — Inpatient Hospital Stay: Payer: Medicare Other

## 2017-06-24 DIAGNOSIS — Z23 Encounter for immunization: Secondary | ICD-10-CM

## 2017-06-24 DIAGNOSIS — D693 Immune thrombocytopenic purpura: Secondary | ICD-10-CM | POA: Diagnosis not present

## 2017-06-24 DIAGNOSIS — I214 Non-ST elevation (NSTEMI) myocardial infarction: Secondary | ICD-10-CM | POA: Diagnosis not present

## 2017-06-24 DIAGNOSIS — E538 Deficiency of other specified B group vitamins: Secondary | ICD-10-CM | POA: Diagnosis not present

## 2017-06-24 DIAGNOSIS — J9611 Chronic respiratory failure with hypoxia: Secondary | ICD-10-CM | POA: Diagnosis not present

## 2017-06-24 DIAGNOSIS — I5032 Chronic diastolic (congestive) heart failure: Secondary | ICD-10-CM | POA: Diagnosis not present

## 2017-06-24 DIAGNOSIS — I130 Hypertensive heart and chronic kidney disease with heart failure and stage 1 through stage 4 chronic kidney disease, or unspecified chronic kidney disease: Secondary | ICD-10-CM | POA: Diagnosis not present

## 2017-06-24 DIAGNOSIS — Z95828 Presence of other vascular implants and grafts: Secondary | ICD-10-CM

## 2017-06-24 DIAGNOSIS — I2510 Atherosclerotic heart disease of native coronary artery without angina pectoris: Secondary | ICD-10-CM | POA: Diagnosis not present

## 2017-06-24 DIAGNOSIS — E1122 Type 2 diabetes mellitus with diabetic chronic kidney disease: Secondary | ICD-10-CM | POA: Diagnosis not present

## 2017-06-24 LAB — CBC WITH DIFFERENTIAL/PLATELET
Basophils Absolute: 0 10*3/uL (ref 0.0–0.1)
Basophils Relative: 1 %
Eosinophils Absolute: 0.1 10*3/uL (ref 0.0–0.5)
Eosinophils Relative: 2 %
HEMATOCRIT: 36.5 % — AB (ref 38.4–49.9)
HEMOGLOBIN: 11.8 g/dL — AB (ref 13.0–17.1)
LYMPHS PCT: 18 %
Lymphs Abs: 0.8 10*3/uL — ABNORMAL LOW (ref 0.9–3.3)
MCH: 29.4 pg (ref 27.2–33.4)
MCHC: 32.2 g/dL (ref 32.0–36.0)
MCV: 91.2 fL (ref 79.3–98.0)
Monocytes Absolute: 0.5 10*3/uL (ref 0.1–0.9)
Monocytes Relative: 11 %
NEUTROS ABS: 3.1 10*3/uL (ref 1.5–6.5)
NEUTROS PCT: 68 %
Platelets: 164 10*3/uL (ref 140–400)
RBC: 4.01 MIL/uL — AB (ref 4.20–5.82)
RDW: 17.3 % — ABNORMAL HIGH (ref 11.0–14.6)
WBC: 4.6 10*3/uL (ref 4.0–10.3)

## 2017-06-24 MED ORDER — ROMIPLOSTIM 250 MCG ~~LOC~~ SOLR
50.0000 ug | SUBCUTANEOUS | Status: DC
  Administered 2017-06-24: 50 ug via SUBCUTANEOUS
  Filled 2017-06-24: qty 0.1

## 2017-06-24 MED ORDER — CYANOCOBALAMIN 1000 MCG/ML IJ SOLN
INTRAMUSCULAR | Status: AC
  Filled 2017-06-24: qty 1

## 2017-06-24 MED ORDER — CYANOCOBALAMIN 1000 MCG/ML IJ SOLN
1000.0000 ug | INTRAMUSCULAR | Status: DC
Start: 2017-06-24 — End: 2017-06-24
  Administered 2017-06-24: 1000 ug via SUBCUTANEOUS

## 2017-06-30 DIAGNOSIS — I130 Hypertensive heart and chronic kidney disease with heart failure and stage 1 through stage 4 chronic kidney disease, or unspecified chronic kidney disease: Secondary | ICD-10-CM | POA: Diagnosis not present

## 2017-06-30 DIAGNOSIS — I2510 Atherosclerotic heart disease of native coronary artery without angina pectoris: Secondary | ICD-10-CM | POA: Diagnosis not present

## 2017-06-30 DIAGNOSIS — I5032 Chronic diastolic (congestive) heart failure: Secondary | ICD-10-CM | POA: Diagnosis not present

## 2017-06-30 DIAGNOSIS — E1122 Type 2 diabetes mellitus with diabetic chronic kidney disease: Secondary | ICD-10-CM | POA: Diagnosis not present

## 2017-06-30 DIAGNOSIS — I214 Non-ST elevation (NSTEMI) myocardial infarction: Secondary | ICD-10-CM | POA: Diagnosis not present

## 2017-06-30 DIAGNOSIS — J9611 Chronic respiratory failure with hypoxia: Secondary | ICD-10-CM | POA: Diagnosis not present

## 2017-07-01 DIAGNOSIS — J9611 Chronic respiratory failure with hypoxia: Secondary | ICD-10-CM | POA: Diagnosis not present

## 2017-07-01 DIAGNOSIS — I5032 Chronic diastolic (congestive) heart failure: Secondary | ICD-10-CM | POA: Diagnosis not present

## 2017-07-01 DIAGNOSIS — I2510 Atherosclerotic heart disease of native coronary artery without angina pectoris: Secondary | ICD-10-CM | POA: Diagnosis not present

## 2017-07-01 DIAGNOSIS — I130 Hypertensive heart and chronic kidney disease with heart failure and stage 1 through stage 4 chronic kidney disease, or unspecified chronic kidney disease: Secondary | ICD-10-CM | POA: Diagnosis not present

## 2017-07-01 DIAGNOSIS — E1122 Type 2 diabetes mellitus with diabetic chronic kidney disease: Secondary | ICD-10-CM | POA: Diagnosis not present

## 2017-07-01 DIAGNOSIS — I214 Non-ST elevation (NSTEMI) myocardial infarction: Secondary | ICD-10-CM | POA: Diagnosis not present

## 2017-07-03 DIAGNOSIS — H9042 Sensorineural hearing loss, unilateral, left ear, with unrestricted hearing on the contralateral side: Secondary | ICD-10-CM | POA: Diagnosis not present

## 2017-07-03 DIAGNOSIS — H6122 Impacted cerumen, left ear: Secondary | ICD-10-CM | POA: Diagnosis not present

## 2017-07-03 DIAGNOSIS — J342 Deviated nasal septum: Secondary | ICD-10-CM | POA: Diagnosis not present

## 2017-07-08 ENCOUNTER — Inpatient Hospital Stay: Payer: Medicare Other | Attending: Hematology

## 2017-07-08 ENCOUNTER — Inpatient Hospital Stay: Payer: Medicare Other

## 2017-07-08 DIAGNOSIS — Z95828 Presence of other vascular implants and grafts: Secondary | ICD-10-CM

## 2017-07-08 DIAGNOSIS — Z23 Encounter for immunization: Secondary | ICD-10-CM

## 2017-07-08 DIAGNOSIS — E538 Deficiency of other specified B group vitamins: Secondary | ICD-10-CM | POA: Diagnosis not present

## 2017-07-08 DIAGNOSIS — D693 Immune thrombocytopenic purpura: Secondary | ICD-10-CM | POA: Diagnosis not present

## 2017-07-08 LAB — CBC WITH DIFFERENTIAL/PLATELET
Basophils Absolute: 0 10*3/uL (ref 0.0–0.1)
Basophils Relative: 0 %
EOS ABS: 0.1 10*3/uL (ref 0.0–0.5)
EOS PCT: 1 %
HCT: 38.1 % — ABNORMAL LOW (ref 38.4–49.9)
Hemoglobin: 12.1 g/dL — ABNORMAL LOW (ref 13.0–17.1)
Lymphocytes Relative: 22 %
Lymphs Abs: 1.1 10*3/uL (ref 0.9–3.3)
MCH: 29.7 pg (ref 27.2–33.4)
MCHC: 31.8 g/dL — ABNORMAL LOW (ref 32.0–36.0)
MCV: 93.4 fL (ref 79.3–98.0)
MONO ABS: 0.6 10*3/uL (ref 0.1–0.9)
Monocytes Relative: 11 %
Neutro Abs: 3.3 10*3/uL (ref 1.5–6.5)
Neutrophils Relative %: 66 %
PLATELETS: 150 10*3/uL (ref 140–400)
RBC: 4.08 MIL/uL — ABNORMAL LOW (ref 4.20–5.82)
RDW: 16.3 % — AB (ref 11.0–14.6)
WBC: 5.1 10*3/uL (ref 4.0–10.3)

## 2017-07-08 MED ORDER — CYANOCOBALAMIN 1000 MCG/ML IJ SOLN
INTRAMUSCULAR | Status: AC
  Filled 2017-07-08: qty 1

## 2017-07-08 MED ORDER — CYANOCOBALAMIN 1000 MCG/ML IJ SOLN: 1000.0000 ug | INTRAMUSCULAR | Status: DC

## 2017-07-08 MED ORDER — ROMIPLOSTIM 250 MCG ~~LOC~~ SOLR
50.0000 ug | SUBCUTANEOUS | Status: DC
  Administered 2017-07-08: 50 ug via SUBCUTANEOUS
  Filled 2017-07-08: qty 0.1

## 2017-07-11 DIAGNOSIS — I10 Essential (primary) hypertension: Secondary | ICD-10-CM | POA: Diagnosis not present

## 2017-07-11 DIAGNOSIS — I5022 Chronic systolic (congestive) heart failure: Secondary | ICD-10-CM | POA: Diagnosis not present

## 2017-07-11 DIAGNOSIS — E119 Type 2 diabetes mellitus without complications: Secondary | ICD-10-CM | POA: Diagnosis not present

## 2017-07-11 DIAGNOSIS — R0902 Hypoxemia: Secondary | ICD-10-CM | POA: Diagnosis not present

## 2017-07-19 DIAGNOSIS — E119 Type 2 diabetes mellitus without complications: Secondary | ICD-10-CM | POA: Diagnosis not present

## 2017-07-22 ENCOUNTER — Inpatient Hospital Stay: Payer: Medicare Other

## 2017-07-22 DIAGNOSIS — D693 Immune thrombocytopenic purpura: Secondary | ICD-10-CM | POA: Diagnosis not present

## 2017-07-22 DIAGNOSIS — Z23 Encounter for immunization: Secondary | ICD-10-CM

## 2017-07-22 DIAGNOSIS — E538 Deficiency of other specified B group vitamins: Secondary | ICD-10-CM | POA: Diagnosis not present

## 2017-07-22 DIAGNOSIS — Z95828 Presence of other vascular implants and grafts: Secondary | ICD-10-CM

## 2017-07-22 LAB — CBC WITH DIFFERENTIAL/PLATELET
BASOS PCT: 0 %
Basophils Absolute: 0 10*3/uL (ref 0.0–0.1)
EOS PCT: 1 %
Eosinophils Absolute: 0.1 10*3/uL (ref 0.0–0.5)
HEMATOCRIT: 37.9 % — AB (ref 38.4–49.9)
Hemoglobin: 12.2 g/dL — ABNORMAL LOW (ref 13.0–17.1)
Lymphocytes Relative: 23 %
Lymphs Abs: 1.1 10*3/uL (ref 0.9–3.3)
MCH: 30.2 pg (ref 27.2–33.4)
MCHC: 32.2 g/dL (ref 32.0–36.0)
MCV: 93.8 fL (ref 79.3–98.0)
MONO ABS: 0.7 10*3/uL (ref 0.1–0.9)
MONOS PCT: 14 %
NEUTROS ABS: 3 10*3/uL (ref 1.5–6.5)
Neutrophils Relative %: 62 %
Platelets: 158 10*3/uL (ref 140–400)
RBC: 4.04 MIL/uL — ABNORMAL LOW (ref 4.20–5.82)
RDW: 15.9 % — AB (ref 11.0–14.6)
WBC: 4.9 10*3/uL (ref 4.0–10.3)

## 2017-07-22 MED ORDER — CYANOCOBALAMIN 1000 MCG/ML IJ SOLN
1000.0000 ug | INTRAMUSCULAR | Status: DC
  Administered 2017-07-22: 1000 ug via SUBCUTANEOUS

## 2017-07-22 MED ORDER — ROMIPLOSTIM 250 MCG ~~LOC~~ SOLR
50.0000 ug | SUBCUTANEOUS | Status: DC
  Administered 2017-07-22: 50 ug via SUBCUTANEOUS
  Filled 2017-07-22: qty 0.1

## 2017-08-04 DIAGNOSIS — I447 Left bundle-branch block, unspecified: Secondary | ICD-10-CM | POA: Diagnosis not present

## 2017-08-04 DIAGNOSIS — G4489 Other headache syndrome: Secondary | ICD-10-CM | POA: Diagnosis not present

## 2017-08-04 DIAGNOSIS — I10 Essential (primary) hypertension: Secondary | ICD-10-CM | POA: Diagnosis not present

## 2017-08-04 DIAGNOSIS — R9431 Abnormal electrocardiogram [ECG] [EKG]: Secondary | ICD-10-CM | POA: Diagnosis not present

## 2017-08-04 NOTE — Progress Notes (Signed)
.    Hematology Oncology Clinic note:   Date of service: 08/05/17   Patient Care Team: Clovia Cuff, MD as PCP - General (Internal Medicine) Minus Breeding, MD as PCP - Cardiology (Cardiology) Rico Sheehan (Hematology and Oncology)  CHIEF COMPLAINTS/PURPOSE OF CONSULTATION: Follow-up for ITP   Diagnosis: Idiopathic thrombocytopenic purpura   Current treatment: Nplate q2 weekly to maintain reasonable platelet counts >50K.  Requirement have ranged from 65mcg/kg to 97mcg/kg. Recently stable counts with 0.5-1 mcg/kg qweekly  Previous treatment: Steroids, IVIG.  HISTORY OF PRESENTING ILLNESS:  please see my previous clinic note from 09/28/2014 for details of initial presentation and course of treatment.  Interval History:   Lawrence Warner presents to the clinic today for f/u of his ITP. The patient's last visit with [place] was on 05/13/17. The pt reports that he is doing well overall.   The pt reports that he had a seizure/?presyncopal episode - no loss of consciousness- last night which lasted 30 minutes. It occurred when he sat down for dinner and describes that his head felt funny and began to ache, and he developed some shaking which subsided after 30 minutes. He called paramedics who evaluated him and determined that he should be evaluated at the hospital, but the pt decided not to be evaluated further. He denies passing out and did not lose consciousness at any point. He adds that both sides of his body felt as though he was having a spasm. He felt fine later that night and fell asleep without difficulty. He denies any feelings of confusion and reports having eaten earlier in the day. He denies any headaches today.   Lab results today (08/05/17) of CBC is as follows: all values are WNL except for HGB at 12.8, RDW at 15.5.  On review of systems, pt reports a seizure-like episode, and denies headaches, abdominal pains, diarrhea, and any other symptoms.   MEDICAL HISTORY:  Past  Medical History:  Diagnosis Date  . BPH (benign prostatic hypertrophy)   . Chronic heart failure ([place])   . Chronic renal insufficiency   . Coronary artery disease    CABG in 2000  . Diabetes type 2, controlled ([place])    Diet-controlled  . Diabetic neuropathy ([place])   . Dyslipidemia   . Hypertension   . ITP (idiopathic thrombocytopenic purpura)   . Osteoarthritis   . Pneumonia     SURGICAL HISTORY: Past Surgical History:  Procedure Laterality Date  . CHOLECYSTECTOMY    . CORONARY ARTERY BYPASS GRAFT  2000  . Right hip replacement  2014  . TONSILLECTOMY      SOCIAL HISTORY: Social History   Socioeconomic History  . Marital status: Single    Spouse name: Not on file  . Number of children: Not on file  . Years of education: Not on file  . Highest education level: Not on file  Occupational History  . Occupation: Teaching laboratory technician  . Financial resource strain: Not on file  . Food insecurity:    Worry: Not on file    Inability: Not on file  . Transportation needs:    Medical: Not on file    Non-medical: Not on file  Tobacco Use  . Smoking status: Former Smoker    Packs/day: 2.00    Years: 25.00    Pack years: 50.00  . Smokeless tobacco: Never Used  Substance and Sexual Activity  . Alcohol use: No  . Drug use: No  . Sexual activity: Not Currently  Lifestyle  .  Physical activity:    Days per week: Not on file    Minutes per session: Not on file  . Stress: Not on file  Relationships  . Social connections:    Talks on phone: Not on file    Gets together: Not on file    Attends religious service: Not on file    Active member of club or organization: Not on file    Attends meetings of clubs or organizations: Not on file    Relationship status: Not on file  . Intimate partner violence:    Fear of current or ex partner: Not on file    Emotionally abused: Not on file    Physically abused: Not on file    Forced sexual activity: Not on file  Other Topics  Concern  . Not on file  Social History Narrative    Two children.     He is living in [place] which is an assisted living facility. One of his son is also moving to [facility] and has a Designer, jewellery in religious studies. Patient notes that his other son is a Systems analyst with multiple awards.  FAMILY HISTORY: Family History  Problem Relation Age of Onset  . Lung disease Father   . Cancer Brother     ALLERGIES:  is allergic to alfuzosin hcl er; imdur [isosorbide dinitrate]; penicillins; and tape.  MEDICATIONS:  Current Outpatient Medications  Medication Sig Dispense Refill  . acetaminophen (TYLENOL) 325 MG tablet Take 2 tablets (650 mg total) by mouth daily as needed for mild pain, fever or headache. 30 tablet 1  . alfuzosin (UROXATRAL) 10 MG 24 hr tablet Take 10 mg by mouth daily with breakfast.    . aspirin 325 MG tablet Take 1 tablet (325 mg total) by mouth daily. With food 30 tablet 2  . brimonidine (ALPHAGAN) 0.2 % ophthalmic solution Place 1 drop into both eyes 3 (three) times daily.    . clobetasol cream (TEMOVATE) 1.01 % Apply 1 application topically 2 (two) times daily. To scalp    . cycloSPORINE (RESTASIS) 0.05 % ophthalmic emulsion Place 1 drop into both eyes every 12 (twelve) hours.     . Dextran 70-Hypromellose (ARTIFICIAL TEARS PF OP) Place 1 drop into both eyes 4 (four) times daily.     . furosemide (LASIX) 20 MG tablet Take 1 tablet (20 mg total) by mouth daily. 30 tablet 2  . hydroxypropyl methylcellulose / hypromellose (ISOPTO TEARS / GONIOVISC) 2.5 % ophthalmic solution Place 1 drop into both eyes 3 (three) times daily as needed for dry eyes.    Marland Kitchen latanoprost (XALATAN) 0.005 % ophthalmic solution Place 1 drop into the left eye at bedtime.    . magnesium oxide (MAG-OX) 400 MG tablet Take 400 mg daily by mouth.    . Multiple Vitamin (MULTIVITAMIN WITH MINERALS) TABS tablet Take 1 tablet by mouth daily.    . nitroGLYCERIN (NITROSTAT) 0.4 MG SL tablet  Place 0.4 mg under the tongue every 5 (five) minutes as needed for chest pain.    . nitroGLYCERIN (NITROSTAT) 0.4 MG SL tablet Place 1 tablet (0.4 mg total) under the tongue every 5 (five) minutes as needed for chest pain. 10 tablet 2  . omeprazole (PRILOSEC) 20 MG capsule Take 1 capsule (20 mg total) by mouth daily before breakfast.    . ondansetron (ZOFRAN ODT) 4 MG disintegrating tablet Take 1 tablet (4 mg total) by mouth every 8 (eight) hours as needed for nausea or vomiting. 20 tablet  0  . oxyCODONE-acetaminophen (PERCOCET/ROXICET) 5-325 MG tablet Take 1 tablet by mouth every 4 (four) hours as needed for moderate pain. 15 tablet 0  . simvastatin (ZOCOR) 20 MG tablet Take 20 mg by mouth daily.     No current facility-administered medications for this visit.    Facility-Administered Medications Ordered in Other Visits  Medication Dose Route Frequency Provider Last Rate Last Dose  . cyanocobalamin ((VITAMIN B-12)) injection 1,000 mcg  1,000 mcg Subcutaneous Q30 days [name], MD   1,000 mcg at 03/13/16 1348  . cyanocobalamin ((VITAMIN B-12)) injection 1,000 mcg  1,000 mcg Subcutaneous Q30 days [name], MD   1,000 mcg at 05/14/16 1001  . cyanocobalamin ((VITAMIN B-12)) injection 1,000 mcg  1,000 mcg Subcutaneous Q30 days [name], MD   1,000 mcg at 09/10/16 1022  . cyanocobalamin ((VITAMIN B-12)) injection 1,000 mcg  1,000 mcg Subcutaneous Q30 days [name], MD   1,000 mcg at 12/03/16 1038  . romiPLOStim (NPLATE) injection 50 mcg  0.5 mcg/kg Subcutaneous Weekly [name], MD   50 mcg at 03/13/16 1349  . romiPLOStim (NPLATE) injection 50 mcg  50 mcg Subcutaneous Weekly [name], MD   50 mcg at 05/07/16 1106  . romiPLOStim (NPLATE) injection 50 mcg  0.5 mcg/kg Subcutaneous Weekly [name], MD   50 mcg at 05/14/16 1001  . romiPLOStim (NPLATE) injection 50 mcg  50 mcg Subcutaneous Weekly [name], MD   50  mcg at 08/13/16 0931  . romiPLOStim (NPLATE) injection 50 mcg  50 mcg Subcutaneous Weekly [name], MD   50 mcg at 08/27/16 2952  . romiPLOStim (NPLATE) injection 50 mcg  50 mcg Subcutaneous Weekly [name], MD   50 mcg at 09/03/16 1027  . romiPLOStim (NPLATE) injection 50 mcg  0.5 mcg/kg Subcutaneous Weekly [name], MD   50 mcg at 09/10/16 1023  . romiPLOStim (NPLATE) injection 50 mcg  0.5 mcg/kg Subcutaneous Weekly [name], MD   50 mcg at 12/03/16 1038  . romiPLOStim (NPLATE) injection 50 mcg  50 mcg Subcutaneous Weekly [name], MD   50 mcg at 02/11/17 1039  . romiPLOStim (NPLATE) injection 50 mcg  50 mcg Subcutaneous Weekly [name], MD   50 mcg at 04/01/17 1150  . romiPLOStim (NPLATE) injection 50 mcg  50 mcg Subcutaneous Weekly Irene Limbo, Cloria [place], MD       Romiplostim 5 mcg/kg weekly   REVIEW OF SYSTEMS:   A 10+ POINT REVIEW OF SYSTEMS WAS OBTAINED including neurology, dermatology, psychiatry, cardiac, respiratory, lymph, extremities, GI, GU, Musculoskeletal, constitutional, breasts, reproductive, HEENT.  All pertinent positives are noted in the HPI.  All others are negative.   PHYSICAL EXAMINATION: ECOG PERFORMANCE STATUS: 1 - Symptomatic but completely ambulatory  Vitals:   08/05/17 0945  BP: (!) 140/51  Pulse: (!) 53  Resp: (!) 24  Temp: 97.8 F (36.6 C)  SpO2: 95%   Filed Weights   08/05/17 0945  Weight: 199 lb 4.8 oz (90.4 kg)     GENERAL:alert, elderly gentelman on [place] O2 @2L /min SKIN: no acute rashes, no significant lesions EYES: conjunctiva are pink and non-injected, sclera anicteric OROPHARYNX: MMM, no exudates, no oropharyngeal erythema or ulceration NECK: supple, no JVD LYMPH:  no palpable lymphadenopathy in the cervical, axillary or inguinal regions LUNGS: clear to auscultation b/l with normal respiratory effort HEART: regular rate & rhythm ABDOMEN:  normoactive bowel sounds , non  tender, not  distended. Extremity: no pedal edema PSYCH: alert & oriented x 3 with fluent speech NEURO: no focal motor/sensory deficits   LABORATORY DATA:  . CBC Latest Ref Rng & Units 08/05/2017 07/22/2017 07/08/2017  WBC 4.0 - 10.3 K/uL 4.8 4.9 5.1  Hemoglobin 13.0 - 17.1 g/dL 12.8(L) 12.2(L) 12.1(L)  Hematocrit 38.4 - 49.9 % 39.7 37.9(L) 38.1(L)  Platelets 140 - 400 K/uL 177 158 150    CMP Latest Ref Rng & Units 05/21/2017 05/20/2017 05/19/2017  Glucose 65 - 99 mg/dL 146(H) 156(H) 118(H)  BUN 6 - 20 mg/dL 41(H) 35(H) 23(H)  Creatinine 0.61 - 1.24 mg/dL 1.63(H) 1.76(H) 1.49(H)  Sodium 135 - 145 mmol/L 138 137 139  Potassium 3.5 - 5.1 mmol/L 4.4 4.4 4.1  Chloride 101 - 111 mmol/L 105 102 103  CO2 22 - 32 mmol/L 23 25 24   Calcium 8.9 - 10.3 mg/dL 8.7(L) 8.6(L) 8.8(L)  Total Protein 6.5 - 8.1 g/dL - - -  Total Bilirubin 0.3 - 1.2 mg/dL - - -  Alkaline Phos 38 - 126 U/L - - -  AST 15 - 41 U/L - - -  ALT 17 - 63 U/L - - -    ASSESSMENT & PLAN:   84 y.o. Caucasian male with multiple medical comorbidities with  #1 Chronic ITP since 1989. Has previously been responsive to steroids and has received IVIG on one occasion. He has been maintained on 3-10mcg/kg weekly of Nplate since mid 1007 initially with Dr. Arvin Collard at [facility] and then with Dr. Jimmie Molly at [facility] in [place].  No issues with bleeding . Ultrasound abdomen showed normal spleen size SPEP- no obvious monoclonal protein. IFE showed possibility of an restrictive bands in the IgG and Lambda lanes. Patient's platelet counts have remained remarkably stable at Romiplostim doses of  0.5 mcg/kg every 2 weeks   #2 H/o B12 deficiency  Plan  -Continue his scheduled dose of Nplate at 1.2RFX/JO today and every 2 weeks and adjust dose to maintain platelet counts >50k. If further downtrend in platelets might need to adjust dose/go back to weekly shots. -Avoid NSAIDs -Continue labs q2weeks -Discussed pt labwork today,  08/05/17; blood counts are stable and Platelets are normal at 177k.  -Recommend that pt follow up with his PCP for further heart and brain evaluation after his seizure-like episode yesterday -Pt will continue to receive Nplate and monthly I32   #3  Coronary artery disease status post CABG in year 2000. Not on aspirin due to his ITP . BNP was elevated. ECHO today shows mildly reduced systolic function and LVH  Mild intermittent chest pain. No shortness of breath. -- was evaluated for his chronic intermittent CP by cardiology - Dr Minus Breeding and was started on Imdur.  #4 chronic kidney disease  -Continue to management as per primary care physician  #5 Recent respiratory infection -on home O2 [place] 2L/min  -continue Nplate shots [id number] -labs q2weeks -RTC with Dr Irene Limbo in 3 months   Total time spent 15 minutes more than 50% time on direct patient contact counseling and coordination of care.  Lawrence Lone MD MS Hematology/Oncology Physician [facility]  (Office):       [phone] (Work cell):  [phone] (Fax):           [phone]  I, Baldwin Jamaica, am acting as a scribe for Dr Irene Limbo.   .I have reviewed the above documentation for accuracy and completeness, and I agree with the above. [name] MD

## 2017-08-05 ENCOUNTER — Encounter: Payer: Self-pay | Admitting: Hematology

## 2017-08-05 ENCOUNTER — Inpatient Hospital Stay: Payer: Medicare Other | Attending: Hematology | Admitting: Hematology

## 2017-08-05 ENCOUNTER — Inpatient Hospital Stay: Payer: Medicare Other

## 2017-08-05 ENCOUNTER — Telehealth: Payer: Self-pay | Admitting: Hematology

## 2017-08-05 VITALS — BP 140/51 | HR 53 | Temp 97.8°F | Resp 24 | Ht 67.0 in | Wt 199.3 lb

## 2017-08-05 DIAGNOSIS — Z23 Encounter for immunization: Secondary | ICD-10-CM

## 2017-08-05 DIAGNOSIS — N189 Chronic kidney disease, unspecified: Secondary | ICD-10-CM | POA: Insufficient documentation

## 2017-08-05 DIAGNOSIS — E538 Deficiency of other specified B group vitamins: Secondary | ICD-10-CM

## 2017-08-05 DIAGNOSIS — D693 Immune thrombocytopenic purpura: Secondary | ICD-10-CM

## 2017-08-05 DIAGNOSIS — Z95828 Presence of other vascular implants and grafts: Secondary | ICD-10-CM

## 2017-08-05 LAB — CBC WITH DIFFERENTIAL/PLATELET
Basophils Absolute: 0 10*3/uL (ref 0.0–0.1)
Basophils Relative: 0 %
EOS ABS: 0.1 10*3/uL (ref 0.0–0.5)
EOS PCT: 2 %
HCT: 39.7 % (ref 38.4–49.9)
HEMOGLOBIN: 12.8 g/dL — AB (ref 13.0–17.1)
LYMPHS ABS: 1 10*3/uL (ref 0.9–3.3)
Lymphocytes Relative: 21 %
MCH: 30 pg (ref 27.2–33.4)
MCHC: 32.2 g/dL (ref 32.0–36.0)
MCV: 93.2 fL (ref 79.3–98.0)
Monocytes Absolute: 0.5 10*3/uL (ref 0.1–0.9)
Monocytes Relative: 11 %
NEUTROS PCT: 66 %
Neutro Abs: 3.2 10*3/uL (ref 1.5–6.5)
PLATELETS: 177 10*3/uL (ref 140–400)
RBC: 4.26 MIL/uL (ref 4.20–5.82)
RDW: 15.5 % — ABNORMAL HIGH (ref 11.0–14.6)
WBC: 4.8 10*3/uL (ref 4.0–10.3)

## 2017-08-05 MED ORDER — ROMIPLOSTIM 250 MCG ~~LOC~~ SOLR
50.0000 ug | SUBCUTANEOUS | Status: DC
  Administered 2017-08-05: 50 ug via SUBCUTANEOUS
  Filled 2017-08-05: qty 0.1

## 2017-08-05 NOTE — Telephone Encounter (Signed)
Scheduled appt per 6/11 los - gave patient aVS and calender per los.

## 2017-08-15 ENCOUNTER — Ambulatory Visit: Payer: Medicare Other | Admitting: Adult Health

## 2017-08-19 ENCOUNTER — Inpatient Hospital Stay: Payer: Medicare Other

## 2017-08-19 DIAGNOSIS — Z95828 Presence of other vascular implants and grafts: Secondary | ICD-10-CM

## 2017-08-19 DIAGNOSIS — E538 Deficiency of other specified B group vitamins: Secondary | ICD-10-CM | POA: Diagnosis not present

## 2017-08-19 DIAGNOSIS — D693 Immune thrombocytopenic purpura: Secondary | ICD-10-CM | POA: Diagnosis not present

## 2017-08-19 DIAGNOSIS — Z23 Encounter for immunization: Secondary | ICD-10-CM

## 2017-08-19 DIAGNOSIS — N189 Chronic kidney disease, unspecified: Secondary | ICD-10-CM | POA: Diagnosis not present

## 2017-08-19 LAB — CBC WITH DIFFERENTIAL/PLATELET
BASOS PCT: 0 %
Basophils Absolute: 0 10*3/uL (ref 0.0–0.1)
EOS ABS: 0.1 10*3/uL (ref 0.0–0.5)
EOS PCT: 1 %
HCT: 37.6 % — ABNORMAL LOW (ref 38.4–49.9)
Hemoglobin: 12.3 g/dL — ABNORMAL LOW (ref 13.0–17.1)
LYMPHS ABS: 0.9 10*3/uL (ref 0.9–3.3)
Lymphocytes Relative: 17 %
MCH: 30.4 pg (ref 27.2–33.4)
MCHC: 32.8 g/dL (ref 32.0–36.0)
MCV: 92.7 fL (ref 79.3–98.0)
Monocytes Absolute: 0.6 10*3/uL (ref 0.1–0.9)
Monocytes Relative: 12 %
Neutro Abs: 3.7 10*3/uL (ref 1.5–6.5)
Neutrophils Relative %: 70 %
PLATELETS: 151 10*3/uL (ref 140–400)
RBC: 4.05 MIL/uL — AB (ref 4.20–5.82)
RDW: 14.8 % — ABNORMAL HIGH (ref 11.0–14.6)
WBC: 5.3 10*3/uL (ref 4.0–10.3)

## 2017-08-19 MED ORDER — CYANOCOBALAMIN 1000 MCG/ML IJ SOLN
1000.0000 ug | INTRAMUSCULAR | Status: DC
  Administered 2017-08-19: 1000 ug via SUBCUTANEOUS

## 2017-08-19 MED ORDER — CYANOCOBALAMIN 1000 MCG/ML IJ SOLN
INTRAMUSCULAR | Status: AC
  Filled 2017-08-19: qty 1

## 2017-08-19 MED ORDER — ROMIPLOSTIM 250 MCG ~~LOC~~ SOLR
50.0000 ug | SUBCUTANEOUS | Status: DC
  Administered 2017-08-19: 50 ug via SUBCUTANEOUS
  Filled 2017-08-19: qty 0.1

## 2017-08-22 DIAGNOSIS — I5022 Chronic systolic (congestive) heart failure: Secondary | ICD-10-CM | POA: Diagnosis not present

## 2017-08-22 DIAGNOSIS — E119 Type 2 diabetes mellitus without complications: Secondary | ICD-10-CM | POA: Diagnosis not present

## 2017-08-22 DIAGNOSIS — R0902 Hypoxemia: Secondary | ICD-10-CM | POA: Diagnosis not present

## 2017-08-22 DIAGNOSIS — I10 Essential (primary) hypertension: Secondary | ICD-10-CM | POA: Diagnosis not present

## 2017-08-22 DIAGNOSIS — M6281 Muscle weakness (generalized): Secondary | ICD-10-CM | POA: Diagnosis not present

## 2017-09-02 ENCOUNTER — Inpatient Hospital Stay: Payer: Medicare Other

## 2017-09-02 ENCOUNTER — Inpatient Hospital Stay: Payer: Medicare Other | Attending: Hematology

## 2017-09-02 DIAGNOSIS — D693 Immune thrombocytopenic purpura: Secondary | ICD-10-CM | POA: Diagnosis not present

## 2017-09-02 DIAGNOSIS — Z95828 Presence of other vascular implants and grafts: Secondary | ICD-10-CM

## 2017-09-02 DIAGNOSIS — Z23 Encounter for immunization: Secondary | ICD-10-CM

## 2017-09-02 LAB — CBC WITH DIFFERENTIAL/PLATELET
Basophils Absolute: 0 10*3/uL (ref 0.0–0.1)
Basophils Relative: 0 %
EOS ABS: 0.1 10*3/uL (ref 0.0–0.5)
Eosinophils Relative: 1 %
HCT: 38 % — ABNORMAL LOW (ref 38.4–49.9)
Hemoglobin: 12.6 g/dL — ABNORMAL LOW (ref 13.0–17.1)
LYMPHS ABS: 0.9 10*3/uL (ref 0.9–3.3)
Lymphocytes Relative: 18 %
MCH: 30.6 pg (ref 27.2–33.4)
MCHC: 33 g/dL (ref 32.0–36.0)
MCV: 92.5 fL (ref 79.3–98.0)
MONOS PCT: 11 %
Monocytes Absolute: 0.6 10*3/uL (ref 0.1–0.9)
NEUTROS ABS: 3.7 10*3/uL (ref 1.5–6.5)
NEUTROS PCT: 70 %
Platelets: 135 10*3/uL — ABNORMAL LOW (ref 140–400)
RBC: 4.11 MIL/uL — AB (ref 4.20–5.82)
RDW: 14.2 % (ref 11.0–14.6)
WBC: 5.3 10*3/uL (ref 4.0–10.3)

## 2017-09-02 MED ORDER — ROMIPLOSTIM 250 MCG ~~LOC~~ SOLR
50.0000 ug | SUBCUTANEOUS | Status: DC
  Administered 2017-09-02: 50 ug via SUBCUTANEOUS
  Filled 2017-09-02: qty 0.1

## 2017-09-04 DIAGNOSIS — Z029 Encounter for administrative examinations, unspecified: Secondary | ICD-10-CM | POA: Diagnosis not present

## 2017-09-16 ENCOUNTER — Inpatient Hospital Stay: Payer: Medicare Other

## 2017-09-16 VITALS — BP 140/65 | HR 94 | Temp 97.5°F | Resp 20

## 2017-09-16 DIAGNOSIS — D693 Immune thrombocytopenic purpura: Secondary | ICD-10-CM

## 2017-09-16 DIAGNOSIS — Z95828 Presence of other vascular implants and grafts: Secondary | ICD-10-CM

## 2017-09-16 LAB — CBC WITH DIFFERENTIAL/PLATELET
BASOS PCT: 0 %
Basophils Absolute: 0 10*3/uL (ref 0.0–0.1)
EOS ABS: 0.1 10*3/uL (ref 0.0–0.5)
EOS PCT: 2 %
HCT: 37.9 % — ABNORMAL LOW (ref 38.4–49.9)
Hemoglobin: 12.2 g/dL — ABNORMAL LOW (ref 13.0–17.1)
LYMPHS ABS: 1.3 10*3/uL (ref 0.9–3.3)
Lymphocytes Relative: 26 %
MCH: 30.3 pg (ref 27.2–33.4)
MCHC: 32.2 g/dL (ref 32.0–36.0)
MCV: 94 fL (ref 79.3–98.0)
MONOS PCT: 11 %
Monocytes Absolute: 0.6 10*3/uL (ref 0.1–0.9)
Neutro Abs: 2.9 10*3/uL (ref 1.5–6.5)
Neutrophils Relative %: 61 %
PLATELETS: 151 10*3/uL (ref 140–400)
RBC: 4.03 MIL/uL — ABNORMAL LOW (ref 4.20–5.82)
RDW: 13.7 % (ref 11.0–14.6)
WBC: 4.8 10*3/uL (ref 4.0–10.3)

## 2017-09-16 MED ORDER — ROMIPLOSTIM 250 MCG ~~LOC~~ SOLR
50.0000 ug | SUBCUTANEOUS | Status: DC
  Administered 2017-09-16: 50 ug via SUBCUTANEOUS
  Filled 2017-09-16: qty 0.1

## 2017-09-16 NOTE — Patient Instructions (Signed)
Romiplostim injection What is this medicine? ROMIPLOSTIM (roe mi PLOE stim) helps your body make more platelets. This medicine is used to treat low platelets caused by chronic idiopathic thrombocytopenic purpura (ITP). This medicine may be used for other purposes; ask your health care provider or pharmacist if you have questions. COMMON BRAND NAME(S): Nplate What should I tell my health care provider before I take this medicine? They need to know if you have any of these conditions: -cancer or myelodysplastic syndrome -low blood counts, like low white cell, platelet, or red cell counts -take medicines that treat or prevent blood clots -an unusual or allergic reaction to romiplostim, mannitol, other medicines, foods, dyes, or preservatives -pregnant or trying to get pregnant -breast-feeding How should I use this medicine? This medicine is for injection under the skin. It is given by a health care professional in a hospital or clinic setting. A special MedGuide will be given to you before your injection. Read this information carefully each time. Talk to your pediatrician regarding the use of this medicine in children. Special care may be needed. Overdosage: If you think you have taken too much of this medicine contact a poison control center or emergency room at once. NOTE: This medicine is only for you. Do not share this medicine with others. What if I miss a dose? It is important not to miss your dose. Call your doctor or health care professional if you are unable to keep an appointment. What may interact with this medicine? Interactions are not expected. This list may not describe all possible interactions. Give your health care provider a list of all the medicines, herbs, non-prescription drugs, or dietary supplements you use. Also tell them if you smoke, drink alcohol, or use illegal drugs. Some items may interact with your medicine. What should I watch for while using this  medicine? Your condition will be monitored carefully while you are receiving this medicine. Visit your prescriber or health care professional for regular checks on your progress and for the needed blood tests. It is important to keep all appointments. What side effects may I notice from receiving this medicine? Side effects that you should report to your doctor or health care professional as soon as possible: -allergic reactions like skin rash, itching or hives, swelling of the face, lips, or tongue -shortness of breath, chest pain, swelling in a leg -unusual bleeding or bruising Side effects that usually do not require medical attention (report to your doctor or health care professional if they continue or are bothersome): -dizziness -headache -muscle aches -pain in arms and legs -stomach pain -trouble sleeping This list may not describe all possible side effects. Call your doctor for medical advice about side effects. You may report side effects to FDA at 1-800-FDA-1088. Where should I keep my medicine? This drug is given in a hospital or clinic and will not be stored at home. NOTE: This sheet is a summary. It may not cover all possible information. If you have questions about this medicine, talk to your doctor, pharmacist, or health care provider.  2018 Elsevier/Gold Standard (2007-10-12 15:13:04)  

## 2017-09-20 DIAGNOSIS — I10 Essential (primary) hypertension: Secondary | ICD-10-CM | POA: Diagnosis not present

## 2017-09-20 DIAGNOSIS — I5022 Chronic systolic (congestive) heart failure: Secondary | ICD-10-CM | POA: Diagnosis not present

## 2017-09-20 DIAGNOSIS — E119 Type 2 diabetes mellitus without complications: Secondary | ICD-10-CM | POA: Diagnosis not present

## 2017-09-20 DIAGNOSIS — M6281 Muscle weakness (generalized): Secondary | ICD-10-CM | POA: Diagnosis not present

## 2017-09-30 ENCOUNTER — Inpatient Hospital Stay: Payer: Medicare Other | Attending: Hematology

## 2017-09-30 ENCOUNTER — Inpatient Hospital Stay: Payer: Medicare Other

## 2017-09-30 DIAGNOSIS — D693 Immune thrombocytopenic purpura: Secondary | ICD-10-CM | POA: Diagnosis not present

## 2017-09-30 DIAGNOSIS — Z95828 Presence of other vascular implants and grafts: Secondary | ICD-10-CM

## 2017-09-30 DIAGNOSIS — I10 Essential (primary) hypertension: Secondary | ICD-10-CM | POA: Diagnosis not present

## 2017-09-30 DIAGNOSIS — E538 Deficiency of other specified B group vitamins: Secondary | ICD-10-CM | POA: Diagnosis not present

## 2017-09-30 DIAGNOSIS — R590 Localized enlarged lymph nodes: Secondary | ICD-10-CM | POA: Diagnosis not present

## 2017-09-30 DIAGNOSIS — E7800 Pure hypercholesterolemia, unspecified: Secondary | ICD-10-CM | POA: Diagnosis not present

## 2017-09-30 LAB — CBC WITH DIFFERENTIAL/PLATELET
Basophils Absolute: 0 10*3/uL (ref 0.0–0.1)
Basophils Relative: 0 %
Eosinophils Absolute: 0.1 10*3/uL (ref 0.0–0.5)
Eosinophils Relative: 1 %
HEMATOCRIT: 38.7 % (ref 38.4–49.9)
HEMOGLOBIN: 12.8 g/dL — AB (ref 13.0–17.1)
LYMPHS ABS: 0.9 10*3/uL (ref 0.9–3.3)
Lymphocytes Relative: 20 %
MCH: 30.9 pg (ref 27.2–33.4)
MCHC: 33 g/dL (ref 32.0–36.0)
MCV: 93.5 fL (ref 79.3–98.0)
MONOS PCT: 11 %
Monocytes Absolute: 0.5 10*3/uL (ref 0.1–0.9)
NEUTROS ABS: 3.2 10*3/uL (ref 1.5–6.5)
NEUTROS PCT: 68 %
Platelets: 124 10*3/uL — ABNORMAL LOW (ref 140–400)
RBC: 4.14 MIL/uL — AB (ref 4.20–5.82)
RDW: 13.2 % (ref 11.0–14.6)
WBC: 4.8 10*3/uL (ref 4.0–10.3)

## 2017-09-30 MED ORDER — ROMIPLOSTIM 250 MCG ~~LOC~~ SOLR
50.0000 ug | SUBCUTANEOUS | Status: DC
  Administered 2017-09-30: 50 ug via SUBCUTANEOUS
  Filled 2017-09-30: qty 0.1

## 2017-09-30 MED ORDER — CYANOCOBALAMIN 1000 MCG/ML IJ SOLN
INTRAMUSCULAR | Status: AC
  Filled 2017-09-30: qty 1

## 2017-09-30 MED ORDER — CYANOCOBALAMIN 1000 MCG/ML IJ SOLN
1000.0000 ug | INTRAMUSCULAR | Status: DC
  Administered 2017-09-30: 1000 ug via SUBCUTANEOUS

## 2017-10-14 ENCOUNTER — Inpatient Hospital Stay: Payer: Medicare Other

## 2017-10-14 DIAGNOSIS — Z95828 Presence of other vascular implants and grafts: Secondary | ICD-10-CM

## 2017-10-14 DIAGNOSIS — D693 Immune thrombocytopenic purpura: Secondary | ICD-10-CM | POA: Diagnosis not present

## 2017-10-14 DIAGNOSIS — E538 Deficiency of other specified B group vitamins: Secondary | ICD-10-CM | POA: Diagnosis not present

## 2017-10-14 LAB — CBC WITH DIFFERENTIAL/PLATELET
BASOS PCT: 0 %
Basophils Absolute: 0 10*3/uL (ref 0.0–0.1)
EOS ABS: 0.1 10*3/uL (ref 0.0–0.5)
Eosinophils Relative: 1 %
HCT: 40.8 % (ref 38.4–49.9)
Hemoglobin: 13.4 g/dL (ref 13.0–17.1)
Lymphocytes Relative: 24 %
Lymphs Abs: 1.3 10*3/uL (ref 0.9–3.3)
MCH: 31.1 pg (ref 27.2–33.4)
MCHC: 32.8 g/dL (ref 32.0–36.0)
MCV: 94.7 fL (ref 79.3–98.0)
MONO ABS: 0.8 10*3/uL (ref 0.1–0.9)
Monocytes Relative: 14 %
Neutro Abs: 3.2 10*3/uL (ref 1.5–6.5)
Neutrophils Relative %: 61 %
Platelets: 174 10*3/uL (ref 140–400)
RBC: 4.31 MIL/uL (ref 4.20–5.82)
RDW: 13.1 % (ref 11.0–14.6)
WBC: 5.3 10*3/uL (ref 4.0–10.3)

## 2017-10-14 MED ORDER — ROMIPLOSTIM 250 MCG ~~LOC~~ SOLR
50.0000 ug | SUBCUTANEOUS | Status: DC
  Administered 2017-10-14: 50 ug via SUBCUTANEOUS
  Filled 2017-10-14: qty 0.1

## 2017-10-14 NOTE — Patient Instructions (Signed)
Romiplostim injection What is this medicine? ROMIPLOSTIM (roe mi PLOE stim) helps your body make more platelets. This medicine is used to treat low platelets caused by chronic idiopathic thrombocytopenic purpura (ITP). This medicine may be used for other purposes; ask your health care provider or pharmacist if you have questions. COMMON BRAND NAME(S): Nplate What should I tell my health care provider before I take this medicine? They need to know if you have any of these conditions: -cancer or myelodysplastic syndrome -low blood counts, like low white cell, platelet, or red cell counts -take medicines that treat or prevent blood clots -an unusual or allergic reaction to romiplostim, mannitol, other medicines, foods, dyes, or preservatives -pregnant or trying to get pregnant -breast-feeding How should I use this medicine? This medicine is for injection under the skin. It is given by a health care professional in a hospital or clinic setting. A special MedGuide will be given to you before your injection. Read this information carefully each time. Talk to your pediatrician regarding the use of this medicine in children. Special care may be needed. Overdosage: If you think you have taken too much of this medicine contact a poison control center or emergency room at once. NOTE: This medicine is only for you. Do not share this medicine with others. What if I miss a dose? It is important not to miss your dose. Call your doctor or health care professional if you are unable to keep an appointment. What may interact with this medicine? Interactions are not expected. This list may not describe all possible interactions. Give your health care provider a list of all the medicines, herbs, non-prescription drugs, or dietary supplements you use. Also tell them if you smoke, drink alcohol, or use illegal drugs. Some items may interact with your medicine. What should I watch for while using this  medicine? Your condition will be monitored carefully while you are receiving this medicine. Visit your prescriber or health care professional for regular checks on your progress and for the needed blood tests. It is important to keep all appointments. What side effects may I notice from receiving this medicine? Side effects that you should report to your doctor or health care professional as soon as possible: -allergic reactions like skin rash, itching or hives, swelling of the face, lips, or tongue -shortness of breath, chest pain, swelling in a leg -unusual bleeding or bruising Side effects that usually do not require medical attention (report to your doctor or health care professional if they continue or are bothersome): -dizziness -headache -muscle aches -pain in arms and legs -stomach pain -trouble sleeping This list may not describe all possible side effects. Call your doctor for medical advice about side effects. You may report side effects to FDA at 1-800-FDA-1088. Where should I keep my medicine? This drug is given in a hospital or clinic and will not be stored at home. NOTE: This sheet is a summary. It may not cover all possible information. If you have questions about this medicine, talk to your doctor, pharmacist, or health care provider.  2018 Elsevier/Gold Standard (2007-10-12 15:13:04)  

## 2017-10-23 DIAGNOSIS — D0439 Carcinoma in situ of skin of other parts of face: Secondary | ICD-10-CM | POA: Diagnosis not present

## 2017-10-23 DIAGNOSIS — C4492 Squamous cell carcinoma of skin, unspecified: Secondary | ICD-10-CM

## 2017-10-23 DIAGNOSIS — L570 Actinic keratosis: Secondary | ICD-10-CM | POA: Diagnosis not present

## 2017-10-23 DIAGNOSIS — D044 Carcinoma in situ of skin of scalp and neck: Secondary | ICD-10-CM | POA: Diagnosis not present

## 2017-10-23 DIAGNOSIS — C44319 Basal cell carcinoma of skin of other parts of face: Secondary | ICD-10-CM | POA: Diagnosis not present

## 2017-10-23 DIAGNOSIS — C4491 Basal cell carcinoma of skin, unspecified: Secondary | ICD-10-CM

## 2017-10-23 DIAGNOSIS — D485 Neoplasm of uncertain behavior of skin: Secondary | ICD-10-CM | POA: Diagnosis not present

## 2017-10-23 HISTORY — DX: Basal cell carcinoma of skin, unspecified: C44.91

## 2017-10-23 HISTORY — DX: Squamous cell carcinoma of skin, unspecified: C44.92

## 2017-10-23 NOTE — Progress Notes (Signed)
.    Hematology Oncology Clinic note:   Date of service: 10/28/17   Patient Care Team: Clovia Cuff, MD as PCP - General (Internal Medicine) Minus Breeding, MD as PCP - Cardiology (Cardiology) Rico Sheehan (Hematology and Oncology)  CHIEF COMPLAINTS/PURPOSE OF CONSULTATION: Follow-up for ITP   Diagnosis: Idiopathic thrombocytopenic purpura   Current treatment: Nplate q2 weekly to maintain reasonable platelet counts >50K.  Requirement have ranged from 10mcg/kg to 89mcg/kg. Recently stable counts with 0.5-1 mcg/kg qweekly  Previous treatment: Steroids, IVIG.  HISTORY OF PRESENTING ILLNESS:  please see my previous clinic note from 09/28/2014 for details of initial presentation and course of treatment.  Interval History:   Lawrence Warner presents to the clinic today for f/u of his ITP. The patient's last visit with [place] was on 08/05/17. The pt reports that he is doing well overall.   The pt reports that he is feeling better than he has in several years. He notes that he continues to eat well, and is enjoying spending time with those whom he cares for.   The pt notes that he received some antibiotics recently after developing some soreness in the back right side of his mouth. He notes that he has developed a small knot under his right ear. The soreness subsided a week ago, but he will be seeing an ENT in the next week. The pt notes that the inflammation resolved after taking the antibiotics.   Lab results today (10/28/17) of CBC w/diff is as follows: all values are WNL except for RBC at 3.90, HGB at 12.1, HCT at 36.7.  On review of systems, pt reports good energy levels, small nodule under right ear, stable breathing, and denies concerns for bleeding, back pains, abdominal pains, leg swelling, and any other symptoms.   MEDICAL HISTORY:  Past Medical History:  Diagnosis Date  . BPH (benign prostatic hypertrophy)   . Chronic heart failure ([place])   . Chronic renal insufficiency   .  Coronary artery disease    CABG in 2000  . Diabetes type 2, controlled ([place])    Diet-controlled  . Diabetic neuropathy ([place])   . Dyslipidemia   . Hypertension   . ITP (idiopathic thrombocytopenic purpura)   . Osteoarthritis   . Pneumonia     SURGICAL HISTORY: Past Surgical History:  Procedure Laterality Date  . CHOLECYSTECTOMY    . CORONARY ARTERY BYPASS GRAFT  2000  . Right hip replacement  2014  . TONSILLECTOMY      SOCIAL HISTORY: Social History   Socioeconomic History  . Marital status: Single    Spouse name: Not on file  . Number of children: Not on file  . Years of education: Not on file  . Highest education level: Not on file  Occupational History  . Occupation: Teaching laboratory technician  . Financial resource strain: Not on file  . Food insecurity:    Worry: Not on file    Inability: Not on file  . Transportation needs:    Medical: Not on file    Non-medical: Not on file  Tobacco Use  . Smoking status: Former Smoker    Packs/day: 2.00    Years: 25.00    Pack years: 50.00  . Smokeless tobacco: Never Used  Substance and Sexual Activity  . Alcohol use: No  . Drug use: No  . Sexual activity: Not Currently  Lifestyle  . Physical activity:    Days per week: Not on file    Minutes per session: Not  on file  . Stress: Not on file  Relationships  . Social connections:    Talks on phone: Not on file    Gets together: Not on file    Attends religious service: Not on file    Active member of club or organization: Not on file    Attends meetings of clubs or organizations: Not on file    Relationship status: Not on file  . Intimate partner violence:    Fear of current or ex partner: Not on file    Emotionally abused: Not on file    Physically abused: Not on file    Forced sexual activity: Not on file  Other Topics Concern  . Not on file  Social History Narrative    Two children.     He is living in [place] which is an assisted living  facility. One of his son is also moving to [facility] and has a Designer, jewellery in religious studies. Patient notes that his other son is a Systems analyst with multiple awards.  FAMILY HISTORY: Family History  Problem Relation Age of Onset  . Lung disease Father   . Cancer Brother     ALLERGIES:  is allergic to alfuzosin hcl er; imdur [isosorbide dinitrate]; penicillins; and tape.  MEDICATIONS:  Current Outpatient Medications  Medication Sig Dispense Refill  . acetaminophen (TYLENOL) 325 MG tablet Take 2 tablets (650 mg total) by mouth daily as needed for mild pain, fever or headache. 30 tablet 1  . alfuzosin (UROXATRAL) 10 MG 24 hr tablet Take 10 mg by mouth daily with breakfast.    . aspirin 325 MG tablet Take 1 tablet (325 mg total) by mouth daily. With food 30 tablet 2  . brimonidine (ALPHAGAN) 0.2 % ophthalmic solution Place 1 drop into both eyes 3 (three) times daily.    . clobetasol cream (TEMOVATE) 0.96 % Apply 1 application topically 2 (two) times daily. To scalp    . cycloSPORINE (RESTASIS) 0.05 % ophthalmic emulsion Place 1 drop into both eyes every 12 (twelve) hours.     . Dextran 70-Hypromellose (ARTIFICIAL TEARS PF OP) Place 1 drop into both eyes 4 (four) times daily.     . hydroxypropyl methylcellulose / hypromellose (ISOPTO TEARS / GONIOVISC) 2.5 % ophthalmic solution Place 1 drop into both eyes 3 (three) times daily as needed for dry eyes.    Marland Kitchen latanoprost (XALATAN) 0.005 % ophthalmic solution Place 1 drop into the left eye at bedtime.    . magnesium oxide (MAG-OX) 400 MG tablet Take 400 mg daily by mouth.    . Multiple Vitamin (MULTIVITAMIN WITH MINERALS) TABS tablet Take 1 tablet by mouth daily.    . nitroGLYCERIN (NITROSTAT) 0.4 MG SL tablet Place 0.4 mg under the tongue every 5 (five) minutes as needed for chest pain.    Marland Kitchen omeprazole (PRILOSEC) 20 MG capsule Take 1 capsule (20 mg total) by mouth daily before breakfast.    . ondansetron (ZOFRAN ODT) 4 MG  disintegrating tablet Take 1 tablet (4 mg total) by mouth every 8 (eight) hours as needed for nausea or vomiting. 20 tablet 0  . simvastatin (ZOCOR) 20 MG tablet Take 20 mg by mouth daily.     No current facility-administered medications for this visit.    Facility-Administered Medications Ordered in Other Visits  Medication Dose Route Frequency Provider Last Rate Last Dose  . cyanocobalamin ((VITAMIN B-12)) injection 1,000 mcg  1,000 mcg Subcutaneous Q30 days [name], MD   1,000 mcg  at 03/13/16 1348  . cyanocobalamin ((VITAMIN B-12)) injection 1,000 mcg  1,000 mcg Subcutaneous Q30 days [name], MD   1,000 mcg at 05/14/16 1001  . cyanocobalamin ((VITAMIN B-12)) injection 1,000 mcg  1,000 mcg Subcutaneous Q30 days [name], MD   1,000 mcg at 09/10/16 1022  . cyanocobalamin ((VITAMIN B-12)) injection 1,000 mcg  1,000 mcg Subcutaneous Q30 days [name], MD   1,000 mcg at 12/03/16 1038  . cyanocobalamin ((VITAMIN B-12)) injection 1,000 mcg  1,000 mcg Subcutaneous Q30 days [name], MD      . romiPLOStim (NPLATE) injection 50 mcg  0.5 mcg/kg Subcutaneous Weekly [name], MD   50 mcg at 03/13/16 1349  . romiPLOStim (NPLATE) injection 50 mcg  50 mcg Subcutaneous Weekly [name], MD   50 mcg at 05/07/16 1106  . romiPLOStim (NPLATE) injection 50 mcg  0.5 mcg/kg Subcutaneous Weekly [name], MD   50 mcg at 05/14/16 1001  . romiPLOStim (NPLATE) injection 50 mcg  50 mcg Subcutaneous Weekly [name], MD   50 mcg at 08/13/16 0931  . romiPLOStim (NPLATE) injection 50 mcg  50 mcg Subcutaneous Weekly [name], MD   50 mcg at 08/27/16 4967  . romiPLOStim (NPLATE) injection 50 mcg  50 mcg Subcutaneous Weekly [name], MD   50 mcg at 09/03/16 1027  . romiPLOStim (NPLATE) injection 50 mcg  0.5 mcg/kg Subcutaneous Weekly [name], MD   50 mcg at 09/10/16 1023  . romiPLOStim  (NPLATE) injection 50 mcg  0.5 mcg/kg Subcutaneous Weekly [name], MD   50 mcg at 12/03/16 1038  . romiPLOStim (NPLATE) injection 50 mcg  50 mcg Subcutaneous Weekly [name], MD   50 mcg at 02/11/17 1039  . romiPLOStim (NPLATE) injection 50 mcg  50 mcg Subcutaneous Weekly [name], MD   50 mcg at 04/01/17 1150  . romiPLOStim (NPLATE) injection 50 mcg  50 mcg Subcutaneous Weekly Irene Limbo, Cloria [place], MD       Romiplostim 5 mcg/kg weekly   REVIEW OF SYSTEMS:    A 10+ POINT REVIEW OF SYSTEMS WAS OBTAINED including neurology, dermatology, psychiatry, cardiac, respiratory, lymph, extremities, GI, GU, Musculoskeletal, constitutional, breasts, reproductive, HEENT.  All pertinent positives are noted in the HPI.  All others are negative.   PHYSICAL EXAMINATION: ECOG PERFORMANCE STATUS: 1 - Symptomatic but completely ambulatory  Vitals:   10/28/17 0951  BP: (!) 142/37  Pulse: 61  Resp: 18  Temp: 97.9 F (36.6 C)  SpO2: 93%   Filed Weights   10/28/17 0951  Weight: 202 lb 11.8 oz (92 kg)     GENERAL:alert, in no acute distress and comfortable SKIN: no acute rashes, no significant lesions EYES: conjunctiva are pink and non-injected, sclera anicteric OROPHARYNX: MMM, no exudates, no oropharyngeal erythema or ulceration NECK: supple, no JVD LYMPH:  no palpable lymphadenopathy in the cervical, axillary or inguinal regions LUNGS: clear to auscultation b/l with normal respiratory effort HEART: regular rate & rhythm ABDOMEN:  normoactive bowel sounds , non tender, not distended. No palpable hepatosplenomegaly.  Extremity: trace pedal edema PSYCH: alert & oriented x 3 with fluent speech NEURO: no focal motor/sensory deficits   LABORATORY DATA:  . CBC Latest Ref Rng & Units 10/28/2017 10/14/2017 09/30/2017  WBC 4.0 - 10.3 K/uL 5.0 5.3 4.8  Hemoglobin 13.0 - 17.1 g/dL 12.1(L) 13.4 12.8(L)  Hematocrit 38.4 - 49.9 % 36.7(L) 40.8 38.7  Platelets 140 - 400  K/uL 173  174 124(L)    CMP Latest Ref Rng & Units 05/21/2017 05/20/2017 05/19/2017  Glucose 65 - 99 mg/dL 146(H) 156(H) 118(H)  BUN 6 - 20 mg/dL 41(H) 35(H) 23(H)  Creatinine 0.61 - 1.24 mg/dL 1.63(H) 1.76(H) 1.49(H)  Sodium 135 - 145 mmol/L 138 137 139  Potassium 3.5 - 5.1 mmol/L 4.4 4.4 4.1  Chloride 101 - 111 mmol/L 105 102 103  CO2 22 - 32 mmol/L 23 25 24   Calcium 8.9 - 10.3 mg/dL 8.7(L) 8.6(L) 8.8(L)  Total Protein 6.5 - 8.1 g/dL - - -  Total Bilirubin 0.3 - 1.2 mg/dL - - -  Alkaline Phos 38 - 126 U/L - - -  AST 15 - 41 U/L - - -  ALT 17 - 63 U/L - - -    ASSESSMENT & PLAN:   84 y.o. Caucasian male with multiple medical comorbidities with  #1 Chronic ITP since 1989. Has previously been responsive to steroids and has received IVIG on one occasion. He has been maintained on 3-55mcg/kg weekly of Nplate since mid 7543 initially with Dr. Arvin Collard at [facility] and then with Dr. Jimmie Molly at [facility] in [place].  No issues with bleeding . Ultrasound abdomen showed normal spleen size SPEP- no obvious monoclonal protein. IFE showed possibility of an restrictive bands in the IgG and Lambda lanes. Patient's platelet counts have remained remarkably stable at Romiplostim doses of  0.5 mcg/kg every 2 weeks   #2 H/o B12 deficiency  Plan  -Continue his scheduled dose of Nplate at 6.0OVP/CH today and every 2 weeks and adjust dose to maintain platelet counts >50k. If further downtrend in platelets might need to adjust dose/go back to weekly shots. -Pt will continue to receive Nplate and monthly E03  -Discussed pt labwork today, 10/28/17; PLT WNL at 173k  -Continue labs every two weeks and NPlate 0.66mcg/kg  -Avoid NSAIDs -Will see the pt back in 4 months, sooner if any new concerns  #3  Coronary artery disease status post CABG in year 2000. Not on aspirin due to his ITP . BNP was elevated. ECHO today shows mildly reduced systolic function and LVH  Mild intermittent chest pain. No  shortness of breath. -- was evaluated for his chronic intermittent CP by cardiology - Dr Minus Breeding and was started on Imdur.  #4 chronic kidney disease  -Continue to management as per primary care physician  #5 Recent respiratory infection Resolved and now off oxygen.   -continue Nplate every two weeks as scheduled -continue labs q2weeks RTC with Dr Irene Limbo in 4 months    The total time spent in the appt was 20 minutes and more than 50% was on counseling and direct patient cares.   Sullivan Lone MD MS Hematology/Oncology Physician [facility]  (Office):       [phone] (Work cell):  [phone] (Fax):           [phone]  I, Baldwin Jamaica, am acting as a scribe for Dr. Irene Limbo  .I have reviewed the above documentation for accuracy and completeness, and I agree with the above. [name] MD

## 2017-10-28 ENCOUNTER — Inpatient Hospital Stay (HOSPITAL_BASED_OUTPATIENT_CLINIC_OR_DEPARTMENT_OTHER): Payer: Medicare Other | Admitting: Hematology

## 2017-10-28 ENCOUNTER — Inpatient Hospital Stay: Payer: Medicare Other

## 2017-10-28 ENCOUNTER — Inpatient Hospital Stay: Payer: Medicare Other | Attending: Hematology

## 2017-10-28 ENCOUNTER — Telehealth: Payer: Self-pay | Admitting: Hematology

## 2017-10-28 VITALS — BP 142/37 | HR 61 | Temp 97.9°F | Resp 18 | Ht 67.0 in | Wt 202.7 lb

## 2017-10-28 DIAGNOSIS — I2510 Atherosclerotic heart disease of native coronary artery without angina pectoris: Secondary | ICD-10-CM | POA: Insufficient documentation

## 2017-10-28 DIAGNOSIS — D693 Immune thrombocytopenic purpura: Secondary | ICD-10-CM

## 2017-10-28 DIAGNOSIS — N189 Chronic kidney disease, unspecified: Secondary | ICD-10-CM | POA: Diagnosis not present

## 2017-10-28 DIAGNOSIS — E538 Deficiency of other specified B group vitamins: Secondary | ICD-10-CM | POA: Diagnosis not present

## 2017-10-28 DIAGNOSIS — Z95828 Presence of other vascular implants and grafts: Secondary | ICD-10-CM

## 2017-10-28 DIAGNOSIS — Z23 Encounter for immunization: Secondary | ICD-10-CM

## 2017-10-28 LAB — CBC WITH DIFFERENTIAL/PLATELET
Basophils Absolute: 0 10*3/uL (ref 0.0–0.1)
Basophils Relative: 1 %
Eosinophils Absolute: 0.1 10*3/uL (ref 0.0–0.5)
Eosinophils Relative: 1 %
HEMATOCRIT: 36.7 % — AB (ref 38.4–49.9)
Hemoglobin: 12.1 g/dL — ABNORMAL LOW (ref 13.0–17.1)
LYMPHS ABS: 0.9 10*3/uL (ref 0.9–3.3)
LYMPHS PCT: 19 %
MCH: 31.1 pg (ref 27.2–33.4)
MCHC: 33 g/dL (ref 32.0–36.0)
MCV: 94 fL (ref 79.3–98.0)
Monocytes Absolute: 0.6 10*3/uL (ref 0.1–0.9)
Monocytes Relative: 11 %
NEUTROS PCT: 68 %
Neutro Abs: 3.4 10*3/uL (ref 1.5–6.5)
PLATELETS: 173 10*3/uL (ref 140–400)
RBC: 3.9 MIL/uL — ABNORMAL LOW (ref 4.20–5.82)
RDW: 13.1 % (ref 11.0–14.6)
WBC: 5 10*3/uL (ref 4.0–10.3)

## 2017-10-28 MED ORDER — ROMIPLOSTIM 250 MCG ~~LOC~~ SOLR
50.0000 ug | SUBCUTANEOUS | Status: DC
  Administered 2017-10-28: 50 ug via SUBCUTANEOUS
  Filled 2017-10-28: qty 0.1

## 2017-10-28 MED ORDER — CYANOCOBALAMIN 1000 MCG/ML IJ SOLN
1000.0000 ug | INTRAMUSCULAR | Status: DC
  Administered 2017-10-28: 1000 ug via SUBCUTANEOUS

## 2017-10-28 NOTE — Telephone Encounter (Signed)
Scheduled appt per 9/3 los - gave patient aVS and calender per los. - per Rn natalie to double check if appts for nplate should be weekly or every two weeks per Rn go ahead and schedule every two weeks.

## 2017-10-28 NOTE — Patient Instructions (Signed)
Romiplostim injection What is this medicine? ROMIPLOSTIM (roe mi PLOE stim) helps your body make more platelets. This medicine is used to treat low platelets caused by chronic idiopathic thrombocytopenic purpura (ITP). This medicine may be used for other purposes; ask your health care provider or pharmacist if you have questions. What should I tell my health care provider before I take this medicine? They need to know if you have any of these conditions: -cancer or myelodysplastic syndrome -low blood counts, like low white cell, platelet, or red cell counts -take medicines that treat or prevent blood clots -an unusual or allergic reaction to romiplostim, mannitol, other medicines, foods, dyes, or preservatives -pregnant or trying to get pregnant -breast-feeding How should I use this medicine? This medicine is for injection under the skin. It is given by a health care professional in a hospital or clinic setting. A special MedGuide will be given to you before your injection. Read this information carefully each time. Talk to your pediatrician regarding the use of this medicine in children. Special care may be needed. Overdosage: If you think you have taken too much of this medicine contact a poison control center or emergency room at once. NOTE: This medicine is only for you. Do not share this medicine with others. What if I miss a dose? It is important not to miss your dose. Call your doctor or health care professional if you are unable to keep an appointment. What may interact with this medicine? Interactions are not expected. This list may not describe all possible interactions. Give your health care provider a list of all the medicines, herbs, non-prescription drugs, or dietary supplements you use. Also tell them if you smoke, drink alcohol, or use illegal drugs. Some items may interact with your medicine. What should I watch for while using this medicine? Your condition will be monitored  carefully while you are receiving this medicine. Visit your prescriber or health care professional for regular checks on your progress and for the needed blood tests. It is important to keep all appointments. What side effects may I notice from receiving this medicine? Side effects that you should report to your doctor or health care professional as soon as possible: -allergic reactions like skin rash, itching or hives, swelling of the face, lips, or tongue -shortness of breath, chest pain, swelling in a leg -unusual bleeding or bruising Side effects that usually do not require medical attention (report to your doctor or health care professional if they continue or are bothersome): -dizziness -headache -muscle aches -pain in arms and legs -stomach pain -trouble sleeping This list may not describe all possible side effects. Call your doctor for medical advice about side effects. You may report side effects to FDA at 1-800-FDA-1088. Where should I keep my medicine? This drug is given in a hospital or clinic and will not be stored at home. NOTE: This sheet is a summary. It may not cover all possible information. If you have questions about this medicine, talk to your doctor, pharmacist, or health care provider.    2016, Elsevier/Gold Standard. (2007-10-12 15:13:04) Cyanocobalamin, Vitamin B12 injection What is this medicine? CYANOCOBALAMIN (sye an oh koe BAL a min) is a man made form of vitamin B12. Vitamin B12 is used in the growth of healthy blood cells, nerve cells, and proteins in the body. It also helps with the metabolism of fats and carbohydrates. This medicine is used to treat people who can not absorb vitamin B12. This medicine may be used for other  purposes; ask your health care provider or pharmacist if you have questions. What should I tell my health care provider before I take this medicine? They need to know if you have any of these conditions: -kidney disease -Leber's  disease -megaloblastic anemia -an unusual or allergic reaction to cyanocobalamin, cobalt, other medicines, foods, dyes, or preservatives -pregnant or trying to get pregnant -breast-feeding How should I use this medicine? This medicine is injected into a muscle or deeply under the skin. It is usually given by a health care professional in a clinic or doctor's office. However, your doctor may teach you how to inject yourself. Follow all instructions. Talk to your pediatrician regarding the use of this medicine in children. Special care may be needed. Overdosage: If you think you have taken too much of this medicine contact a poison control center or emergency room at once. NOTE: This medicine is only for you. Do not share this medicine with others. What if I miss a dose? If you are given your dose at a clinic or doctor's office, call to reschedule your appointment. If you give your own injections and you miss a dose, take it as soon as you can. If it is almost time for your next dose, take only that dose. Do not take double or extra doses. What may interact with this medicine? -colchicine -heavy alcohol intake This list may not describe all possible interactions. Give your health care provider a list of all the medicines, herbs, non-prescription drugs, or dietary supplements you use. Also tell them if you smoke, drink alcohol, or use illegal drugs. Some items may interact with your medicine. What should I watch for while using this medicine? Visit your doctor or health care professional regularly. You may need blood work done while you are taking this medicine. You may need to follow a special diet. Talk to your doctor. Limit your alcohol intake and avoid smoking to get the best benefit. What side effects may I notice from receiving this medicine? Side effects that you should report to your doctor or health care professional as soon as possible: -allergic reactions like skin rash, itching or  hives, swelling of the face, lips, or tongue -blue tint to skin -chest tightness, pain -difficulty breathing, wheezing -dizziness -red, swollen painful area on the leg Side effects that usually do not require medical attention (report to your doctor or health care professional if they continue or are bothersome): -diarrhea -headache This list may not describe all possible side effects. Call your doctor for medical advice about side effects. You may report side effects to FDA at 1-800-FDA-1088. Where should I keep my medicine? Keep out of the reach of children. Store at room temperature between 15 and 30 degrees C (59 and 85 degrees F). Protect from light. Throw away any unused medicine after the expiration date. NOTE: This sheet is a summary. It may not cover all possible information. If you have questions about this medicine, talk to your doctor, pharmacist, or health care provider.    2016, Elsevier/Gold Standard. (2007-05-25 22:10:20)

## 2017-11-01 DIAGNOSIS — I5022 Chronic systolic (congestive) heart failure: Secondary | ICD-10-CM | POA: Diagnosis not present

## 2017-11-01 DIAGNOSIS — M6281 Muscle weakness (generalized): Secondary | ICD-10-CM | POA: Diagnosis not present

## 2017-11-01 DIAGNOSIS — E119 Type 2 diabetes mellitus without complications: Secondary | ICD-10-CM | POA: Diagnosis not present

## 2017-11-01 DIAGNOSIS — N183 Chronic kidney disease, stage 3 (moderate): Secondary | ICD-10-CM | POA: Diagnosis not present

## 2017-11-01 DIAGNOSIS — K219 Gastro-esophageal reflux disease without esophagitis: Secondary | ICD-10-CM | POA: Diagnosis not present

## 2017-11-06 DIAGNOSIS — H903 Sensorineural hearing loss, bilateral: Secondary | ICD-10-CM | POA: Diagnosis not present

## 2017-11-06 DIAGNOSIS — J342 Deviated nasal septum: Secondary | ICD-10-CM | POA: Diagnosis not present

## 2017-11-06 DIAGNOSIS — K1121 Acute sialoadenitis: Secondary | ICD-10-CM | POA: Diagnosis not present

## 2017-11-06 DIAGNOSIS — J343 Hypertrophy of nasal turbinates: Secondary | ICD-10-CM | POA: Diagnosis not present

## 2017-11-11 ENCOUNTER — Inpatient Hospital Stay: Payer: Medicare Other

## 2017-11-11 DIAGNOSIS — I2510 Atherosclerotic heart disease of native coronary artery without angina pectoris: Secondary | ICD-10-CM | POA: Diagnosis not present

## 2017-11-11 DIAGNOSIS — N189 Chronic kidney disease, unspecified: Secondary | ICD-10-CM | POA: Diagnosis not present

## 2017-11-11 DIAGNOSIS — E538 Deficiency of other specified B group vitamins: Secondary | ICD-10-CM | POA: Diagnosis not present

## 2017-11-11 DIAGNOSIS — Z95828 Presence of other vascular implants and grafts: Secondary | ICD-10-CM

## 2017-11-11 DIAGNOSIS — D693 Immune thrombocytopenic purpura: Secondary | ICD-10-CM | POA: Diagnosis not present

## 2017-11-11 LAB — CMP (CANCER CENTER ONLY)
ALBUMIN: 3.4 g/dL — AB (ref 3.5–5.0)
ALT: 10 U/L (ref 0–44)
AST: 16 U/L (ref 15–41)
Alkaline Phosphatase: 59 U/L (ref 38–126)
Anion gap: 8 (ref 5–15)
BILIRUBIN TOTAL: 0.9 mg/dL (ref 0.3–1.2)
BUN: 19 mg/dL (ref 8–23)
CO2: 26 mmol/L (ref 22–32)
Calcium: 9.2 mg/dL (ref 8.9–10.3)
Chloride: 108 mmol/L (ref 98–111)
Creatinine: 1.44 mg/dL — ABNORMAL HIGH (ref 0.61–1.24)
GFR, Est AFR Am: 46 mL/min — ABNORMAL LOW (ref >60)
GFR, Estimated: 39 mL/min — ABNORMAL LOW (ref >60)
GLUCOSE: 124 mg/dL — AB (ref 70–99)
POTASSIUM: 4.3 mmol/L (ref 3.5–5.1)
Sodium: 142 mmol/L (ref 135–145)
TOTAL PROTEIN: 6.5 g/dL (ref 6.5–8.1)

## 2017-11-11 LAB — CBC WITH DIFFERENTIAL/PLATELET
BASOS PCT: 0 %
Basophils Absolute: 0 10*3/uL (ref 0.0–0.1)
Eosinophils Absolute: 0.1 10*3/uL (ref 0.0–0.5)
Eosinophils Relative: 1 %
HEMATOCRIT: 36.4 % — AB (ref 38.4–49.9)
HEMOGLOBIN: 11.8 g/dL — AB (ref 13.0–17.1)
Lymphocytes Relative: 17 %
Lymphs Abs: 1 10*3/uL (ref 0.9–3.3)
MCH: 31.3 pg (ref 27.2–33.4)
MCHC: 32.4 g/dL (ref 32.0–36.0)
MCV: 96.6 fL (ref 79.3–98.0)
Monocytes Absolute: 0.6 10*3/uL (ref 0.1–0.9)
Monocytes Relative: 10 %
NEUTROS ABS: 4.1 10*3/uL (ref 1.5–6.5)
NEUTROS PCT: 72 %
Platelets: 160 10*3/uL (ref 140–400)
RBC: 3.77 MIL/uL — AB (ref 4.20–5.82)
RDW: 13.8 % (ref 11.0–14.6)
WBC: 5.7 10*3/uL (ref 4.0–10.3)

## 2017-11-11 MED ORDER — ROMIPLOSTIM 250 MCG ~~LOC~~ SOLR
50.0000 ug | SUBCUTANEOUS | Status: DC
  Administered 2017-11-11: 50 ug via SUBCUTANEOUS
  Filled 2017-11-11: qty 0.1

## 2017-11-13 ENCOUNTER — Other Ambulatory Visit (HOSPITAL_COMMUNITY): Payer: Self-pay | Admitting: Otolaryngology

## 2017-11-13 DIAGNOSIS — K1121 Acute sialoadenitis: Secondary | ICD-10-CM | POA: Diagnosis not present

## 2017-11-13 DIAGNOSIS — R221 Localized swelling, mass and lump, neck: Secondary | ICD-10-CM | POA: Diagnosis not present

## 2017-11-18 ENCOUNTER — Ambulatory Visit (HOSPITAL_COMMUNITY)
Admission: RE | Admit: 2017-11-18 | Discharge: 2017-11-18 | Disposition: A | Discharge status: Home / Self Care | Payer: Medicare Other | Source: Ambulatory Visit | Attending: Otolaryngology | Admitting: Otolaryngology

## 2017-11-18 DIAGNOSIS — K1121 Acute sialoadenitis: Secondary | ICD-10-CM | POA: Diagnosis not present

## 2017-11-18 DIAGNOSIS — R221 Localized swelling, mass and lump, neck: Secondary | ICD-10-CM | POA: Insufficient documentation

## 2017-11-20 ENCOUNTER — Ambulatory Visit (HOSPITAL_COMMUNITY): Payer: Medicare Other

## 2017-11-25 ENCOUNTER — Inpatient Hospital Stay: Payer: Medicare Other

## 2017-11-25 ENCOUNTER — Inpatient Hospital Stay: Payer: Medicare Other | Attending: Hematology

## 2017-11-25 VITALS — BP 144/54 | HR 60 | Temp 97.8°F | Resp 20

## 2017-11-25 DIAGNOSIS — D693 Immune thrombocytopenic purpura: Secondary | ICD-10-CM

## 2017-11-25 DIAGNOSIS — Z95828 Presence of other vascular implants and grafts: Secondary | ICD-10-CM

## 2017-11-25 DIAGNOSIS — Z23 Encounter for immunization: Secondary | ICD-10-CM | POA: Diagnosis not present

## 2017-11-25 LAB — CBC WITH DIFFERENTIAL/PLATELET
BASOS ABS: 0 10*3/uL (ref 0.0–0.1)
BASOS PCT: 0 %
Eosinophils Absolute: 0 10*3/uL (ref 0.0–0.5)
Eosinophils Relative: 1 %
HCT: 37.5 % — ABNORMAL LOW (ref 38.4–49.9)
Hemoglobin: 12.3 g/dL — ABNORMAL LOW (ref 13.0–17.1)
LYMPHS PCT: 21 %
Lymphs Abs: 1.2 10*3/uL (ref 0.9–3.3)
MCH: 31.5 pg (ref 27.2–33.4)
MCHC: 32.7 g/dL (ref 32.0–36.0)
MCV: 96.5 fL (ref 79.3–98.0)
MONO ABS: 0.6 10*3/uL (ref 0.1–0.9)
Monocytes Relative: 10 %
Neutro Abs: 4.1 10*3/uL (ref 1.5–6.5)
Neutrophils Relative %: 68 %
Platelets: 175 10*3/uL (ref 140–400)
RBC: 3.89 MIL/uL — AB (ref 4.20–5.82)
RDW: 14.2 % (ref 11.0–14.6)
WBC: 6 10*3/uL (ref 4.0–10.3)

## 2017-11-25 LAB — CMP (CANCER CENTER ONLY)
ALK PHOS: 71 U/L (ref 38–126)
ALT: 13 U/L (ref 0–44)
ANION GAP: 8 (ref 5–15)
AST: 21 U/L (ref 15–41)
Albumin: 3.5 g/dL (ref 3.5–5.0)
BILIRUBIN TOTAL: 0.6 mg/dL (ref 0.3–1.2)
BUN: 26 mg/dL — ABNORMAL HIGH (ref 8–23)
CALCIUM: 9.3 mg/dL (ref 8.9–10.3)
CO2: 24 mmol/L (ref 22–32)
Chloride: 111 mmol/L (ref 98–111)
Creatinine: 1.49 mg/dL — ABNORMAL HIGH (ref 0.61–1.24)
GFR, EST NON AFRICAN AMERICAN: 38 mL/min — AB (ref >60)
GFR, Est AFR Am: 44 mL/min — ABNORMAL LOW (ref >60)
Glucose, Bld: 106 mg/dL — ABNORMAL HIGH (ref 70–99)
POTASSIUM: 4.2 mmol/L (ref 3.5–5.1)
Sodium: 143 mmol/L (ref 135–145)
TOTAL PROTEIN: 7 g/dL (ref 6.5–8.1)

## 2017-11-25 MED ORDER — ROMIPLOSTIM 250 MCG ~~LOC~~ SOLR
50.0000 ug | SUBCUTANEOUS | Status: DC
  Administered 2017-11-25: 50 ug via SUBCUTANEOUS
  Filled 2017-11-25: qty 0.1

## 2017-11-25 MED ORDER — INFLUENZA VAC SPLIT QUAD 0.5 ML IM SUSY
0.5000 mL | PREFILLED_SYRINGE | Freq: Once | INTRAMUSCULAR | Status: AC
  Administered 2017-11-25: 0.5 mL via INTRAMUSCULAR

## 2017-11-25 NOTE — Patient Instructions (Signed)
Romiplostim injection What is this medicine? ROMIPLOSTIM (roe mi PLOE stim) helps your body make more platelets. This medicine is used to treat low platelets caused by chronic idiopathic thrombocytopenic purpura (ITP). This medicine may be used for other purposes; ask your health care provider or pharmacist if you have questions. COMMON BRAND NAME(S): Nplate What should I tell my health care provider before I take this medicine? They need to know if you have any of these conditions: -cancer or myelodysplastic syndrome -low blood counts, like low white cell, platelet, or red cell counts -take medicines that treat or prevent blood clots -an unusual or allergic reaction to romiplostim, mannitol, other medicines, foods, dyes, or preservatives -pregnant or trying to get pregnant -breast-feeding How should I use this medicine? This medicine is for injection under the skin. It is given by a health care professional in a hospital or clinic setting. A special MedGuide will be given to you before your injection. Read this information carefully each time. Talk to your pediatrician regarding the use of this medicine in children. Special care may be needed. Overdosage: If you think you have taken too much of this medicine contact a poison control center or emergency room at once. NOTE: This medicine is only for you. Do not share this medicine with others. What if I miss a dose? It is important not to miss your dose. Call your doctor or health care professional if you are unable to keep an appointment. What may interact with this medicine? Interactions are not expected. This list may not describe all possible interactions. Give your health care provider a list of all the medicines, herbs, non-prescription drugs, or dietary supplements you use. Also tell them if you smoke, drink alcohol, or use illegal drugs. Some items may interact with your medicine. What should I watch for while using this  medicine? Your condition will be monitored carefully while you are receiving this medicine. Visit your prescriber or health care professional for regular checks on your progress and for the needed blood tests. It is important to keep all appointments. What side effects may I notice from receiving this medicine? Side effects that you should report to your doctor or health care professional as soon as possible: -allergic reactions like skin rash, itching or hives, swelling of the face, lips, or tongue -shortness of breath, chest pain, swelling in a leg -unusual bleeding or bruising Side effects that usually do not require medical attention (report to your doctor or health care professional if they continue or are bothersome): -dizziness -headache -muscle aches -pain in arms and legs -stomach pain -trouble sleeping This list may not describe all possible side effects. Call your doctor for medical advice about side effects. You may report side effects to FDA at 1-800-FDA-1088. Where should I keep my medicine? This drug is given in a hospital or clinic and will not be stored at home. NOTE: This sheet is a summary. It may not cover all possible information. If you have questions about this medicine, talk to your doctor, pharmacist, or health care provider.  2018 Elsevier/Gold Standard (2007-10-12 15:13:04) Influenza Virus Vaccine (Flucelvax) What is this medicine? INFLUENZA VIRUS VACCINE (in floo EN zuh VAHY ruhs vak SEEN) helps to reduce the risk of getting influenza also known as the flu. The vaccine only helps protect you against some strains of the flu. This medicine may be used for other purposes; ask your health care provider or pharmacist if you have questions. COMMON BRAND NAME(S): FLUCELVAX What should I  tell my health care provider before I take this medicine? They need to know if you have any of these conditions: -bleeding disorder like hemophilia -fever or  infection -Guillain-Barre syndrome or other neurological problems -immune system problems -infection with the human immunodeficiency virus (HIV) or AIDS -low blood platelet counts -multiple sclerosis -an unusual or allergic reaction to influenza virus vaccine, other medicines, foods, dyes or preservatives -pregnant or trying to get pregnant -breast-feeding How should I use this medicine? This vaccine is for injection into a muscle. It is given by a health care professional. A copy of Vaccine Information Statements will be given before each vaccination. Read this sheet carefully each time. The sheet may change frequently. Talk to your pediatrician regarding the use of this medicine in children. Special care may be needed. Overdosage: If you think you've taken too much of this medicine contact a poison control center or emergency room at once. Overdosage: If you think you have taken too much of this medicine contact a poison control center or emergency room at once. NOTE: This medicine is only for you. Do not share this medicine with others. What if I miss a dose? This does not apply. What may interact with this medicine? -chemotherapy or radiation therapy -medicines that lower your immune system like etanercept, anakinra, infliximab, and adalimumab -medicines that treat or prevent blood clots like warfarin -phenytoin -steroid medicines like prednisone or cortisone -theophylline -vaccines This list may not describe all possible interactions. Give your health care provider a list of all the medicines, herbs, non-prescription drugs, or dietary supplements you use. Also tell them if you smoke, drink alcohol, or use illegal drugs. Some items may interact with your medicine. What should I watch for while using this medicine? Report any side effects that do not go away within 3 days to your doctor or health care professional. Call your health care provider if any unusual symptoms occur within 6  weeks of receiving this vaccine. You may still catch the flu, but the illness is not usually as bad. You cannot get the flu from the vaccine. The vaccine will not protect against colds or other illnesses that may cause fever. The vaccine is needed every year. What side effects may I notice from receiving this medicine? Side effects that you should report to your doctor or health care professional as soon as possible: -allergic reactions like skin rash, itching or hives, swelling of the face, lips, or tongue Side effects that usually do not require medical attention (Report these to your doctor or health care professional if they continue or are bothersome.): -fever -headache -muscle aches and pains -pain, tenderness, redness, or swelling at the injection site -tiredness This list may not describe all possible side effects. Call your doctor for medical advice about side effects. You may report side effects to FDA at 1-800-FDA-1088. Where should I keep my medicine? The vaccine will be given by a health care professional in a clinic, pharmacy, doctor's office, or other health care setting. You will not be given vaccine doses to store at home. NOTE: This sheet is a summary. It may not cover all possible information. If you have questions about this medicine, talk to your doctor, pharmacist, or health care provider.  2018 Elsevier/Gold Standard (2011-01-23 14:06:47)

## 2017-11-27 DIAGNOSIS — R221 Localized swelling, mass and lump, neck: Secondary | ICD-10-CM | POA: Diagnosis not present

## 2017-11-27 DIAGNOSIS — K1121 Acute sialoadenitis: Secondary | ICD-10-CM | POA: Diagnosis not present

## 2017-12-09 ENCOUNTER — Inpatient Hospital Stay: Payer: Medicare Other

## 2017-12-09 DIAGNOSIS — Z95828 Presence of other vascular implants and grafts: Secondary | ICD-10-CM

## 2017-12-09 DIAGNOSIS — D693 Immune thrombocytopenic purpura: Secondary | ICD-10-CM

## 2017-12-09 DIAGNOSIS — Z23 Encounter for immunization: Secondary | ICD-10-CM | POA: Diagnosis not present

## 2017-12-09 LAB — CBC WITH DIFFERENTIAL/PLATELET
Abs Immature Granulocytes: 0.02 10*3/uL (ref 0.00–0.07)
BASOS ABS: 0 10*3/uL (ref 0.0–0.1)
BASOS PCT: 0 %
EOS ABS: 0.1 10*3/uL (ref 0.0–0.5)
EOS PCT: 1 %
HCT: 40.6 % (ref 39.0–52.0)
Hemoglobin: 13 g/dL (ref 13.0–17.0)
IMMATURE GRANULOCYTES: 0 %
Lymphocytes Relative: 22 %
Lymphs Abs: 1.2 10*3/uL (ref 0.7–4.0)
MCH: 31.3 pg (ref 26.0–34.0)
MCHC: 32 g/dL (ref 30.0–36.0)
MCV: 97.8 fL (ref 80.0–100.0)
Monocytes Absolute: 0.6 10*3/uL (ref 0.1–1.0)
Monocytes Relative: 12 %
NRBC: 0 % (ref 0.0–0.2)
Neutro Abs: 3.3 10*3/uL (ref 1.7–7.7)
Neutrophils Relative %: 65 %
PLATELETS: 165 10*3/uL (ref 150–400)
RBC: 4.15 MIL/uL — ABNORMAL LOW (ref 4.22–5.81)
RDW: 13.4 % (ref 11.5–15.5)
WBC: 5.2 10*3/uL (ref 4.0–10.5)

## 2017-12-09 MED ORDER — ROMIPLOSTIM 250 MCG ~~LOC~~ SOLR
50.0000 ug | SUBCUTANEOUS | Status: DC
  Administered 2017-12-09: 50 ug via SUBCUTANEOUS
  Filled 2017-12-09: qty 0.1

## 2017-12-11 DIAGNOSIS — E119 Type 2 diabetes mellitus without complications: Secondary | ICD-10-CM | POA: Diagnosis not present

## 2017-12-11 DIAGNOSIS — M6281 Muscle weakness (generalized): Secondary | ICD-10-CM | POA: Diagnosis not present

## 2017-12-11 DIAGNOSIS — I2510 Atherosclerotic heart disease of native coronary artery without angina pectoris: Secondary | ICD-10-CM | POA: Diagnosis not present

## 2017-12-11 DIAGNOSIS — I10 Essential (primary) hypertension: Secondary | ICD-10-CM | POA: Diagnosis not present

## 2017-12-11 DIAGNOSIS — I5022 Chronic systolic (congestive) heart failure: Secondary | ICD-10-CM | POA: Diagnosis not present

## 2017-12-23 ENCOUNTER — Inpatient Hospital Stay: Payer: Medicare Other

## 2017-12-23 VITALS — BP 165/41 | HR 51 | Temp 97.6°F | Resp 18

## 2017-12-23 DIAGNOSIS — Z95828 Presence of other vascular implants and grafts: Secondary | ICD-10-CM

## 2017-12-23 DIAGNOSIS — Z23 Encounter for immunization: Secondary | ICD-10-CM | POA: Diagnosis not present

## 2017-12-23 DIAGNOSIS — D693 Immune thrombocytopenic purpura: Secondary | ICD-10-CM | POA: Diagnosis not present

## 2017-12-23 LAB — CBC WITH DIFFERENTIAL/PLATELET
Abs Immature Granulocytes: 0.02 10*3/uL (ref 0.00–0.07)
BASOS PCT: 0 %
Basophils Absolute: 0 10*3/uL (ref 0.0–0.1)
EOS ABS: 0.1 10*3/uL (ref 0.0–0.5)
Eosinophils Relative: 2 %
HCT: 38.5 % — ABNORMAL LOW (ref 39.0–52.0)
Hemoglobin: 12.1 g/dL — ABNORMAL LOW (ref 13.0–17.0)
Immature Granulocytes: 0 %
Lymphocytes Relative: 23 %
Lymphs Abs: 1.1 10*3/uL (ref 0.7–4.0)
MCH: 31 pg (ref 26.0–34.0)
MCHC: 31.4 g/dL (ref 30.0–36.0)
MCV: 98.7 fL (ref 80.0–100.0)
MONO ABS: 0.5 10*3/uL (ref 0.1–1.0)
MONOS PCT: 11 %
Neutro Abs: 3 10*3/uL (ref 1.7–7.7)
Neutrophils Relative %: 64 %
PLATELETS: 136 10*3/uL — AB (ref 150–400)
RBC: 3.9 MIL/uL — ABNORMAL LOW (ref 4.22–5.81)
RDW: 13.1 % (ref 11.5–15.5)
WBC: 4.6 10*3/uL (ref 4.0–10.5)
nRBC: 0 % (ref 0.0–0.2)

## 2017-12-23 MED ORDER — ROMIPLOSTIM 250 MCG ~~LOC~~ SOLR
50.0000 ug | SUBCUTANEOUS | Status: DC
  Administered 2017-12-23: 50 ug via SUBCUTANEOUS
  Filled 2017-12-23: qty 0.1

## 2017-12-25 DIAGNOSIS — C44319 Basal cell carcinoma of skin of other parts of face: Secondary | ICD-10-CM | POA: Diagnosis not present

## 2017-12-25 DIAGNOSIS — D0439 Carcinoma in situ of skin of other parts of face: Secondary | ICD-10-CM | POA: Diagnosis not present

## 2017-12-25 DIAGNOSIS — L570 Actinic keratosis: Secondary | ICD-10-CM | POA: Diagnosis not present

## 2018-01-06 ENCOUNTER — Inpatient Hospital Stay: Payer: Medicare Other

## 2018-01-06 ENCOUNTER — Inpatient Hospital Stay: Payer: Medicare Other | Attending: Hematology

## 2018-01-06 DIAGNOSIS — D693 Immune thrombocytopenic purpura: Secondary | ICD-10-CM

## 2018-01-06 DIAGNOSIS — R42 Dizziness and giddiness: Secondary | ICD-10-CM | POA: Insufficient documentation

## 2018-01-06 DIAGNOSIS — E860 Dehydration: Secondary | ICD-10-CM | POA: Insufficient documentation

## 2018-01-06 DIAGNOSIS — Z95828 Presence of other vascular implants and grafts: Secondary | ICD-10-CM

## 2018-01-06 LAB — CBC WITH DIFFERENTIAL/PLATELET
ABS IMMATURE GRANULOCYTES: 0.02 10*3/uL (ref 0.00–0.07)
Basophils Absolute: 0 10*3/uL (ref 0.0–0.1)
Basophils Relative: 0 %
Eosinophils Absolute: 0.1 10*3/uL (ref 0.0–0.5)
Eosinophils Relative: 1 %
HEMATOCRIT: 39.7 % (ref 39.0–52.0)
HEMOGLOBIN: 12.7 g/dL — AB (ref 13.0–17.0)
Immature Granulocytes: 0 %
LYMPHS ABS: 1.2 10*3/uL (ref 0.7–4.0)
LYMPHS PCT: 22 %
MCH: 31.1 pg (ref 26.0–34.0)
MCHC: 32 g/dL (ref 30.0–36.0)
MCV: 97.1 fL (ref 80.0–100.0)
MONOS PCT: 10 %
Monocytes Absolute: 0.6 10*3/uL (ref 0.1–1.0)
NEUTROS ABS: 3.7 10*3/uL (ref 1.7–7.7)
Neutrophils Relative %: 67 %
Platelets: 138 10*3/uL — ABNORMAL LOW (ref 150–400)
RBC: 4.09 MIL/uL — AB (ref 4.22–5.81)
RDW: 13 % (ref 11.5–15.5)
WBC: 5.6 10*3/uL (ref 4.0–10.5)
nRBC: 0 % (ref 0.0–0.2)

## 2018-01-06 MED ORDER — CYANOCOBALAMIN 1000 MCG/ML IJ SOLN
1000.0000 ug | INTRAMUSCULAR | Status: DC
  Administered 2018-01-06: 1000 ug via SUBCUTANEOUS

## 2018-01-06 MED ORDER — ROMIPLOSTIM 250 MCG ~~LOC~~ SOLR
50.0000 ug | SUBCUTANEOUS | Status: DC
  Administered 2018-01-06: 50 ug via SUBCUTANEOUS
  Filled 2018-01-06: qty 0.1

## 2018-01-06 MED ORDER — CYANOCOBALAMIN 1000 MCG/ML IJ SOLN
INTRAMUSCULAR | Status: AC
  Filled 2018-01-06: qty 1

## 2018-01-07 NOTE — Progress Notes (Signed)
Cardiology Office Note   Date:  01/08/2018   ID:  Lawrence Warner, DOB [birth year], MRN [id number]  PCP:  Clovia Cuff, MD  Cardiologist:   Minus Breeding, MD    Chief Complaint  Patient presents with  . Coronary Artery Disease      History of Present Illness: Lawrence Warner is a 84 y.o. male who presents for evaluation of bradycardia and edema.   He was admitted 07/20/16-07/26/16 with CAP and acute respiratory failure. He came back to the ED 07/27/16 with respiratory failure felt to be secondary to diastolic CHF.  He was last seen in the office in January.   Since he was last seen he is done relatively well.  He lives at [facility].  He has an 52 year old girlfriend.  He does some walking.  He rides his cart.  He denies any cardiovascular symptoms.  He does not have any presyncope or syncope.  He has no chest pressure, neck or arm discomfort.  He has no weight gain.  He has a little leg cramping but he has a foam that he puts on his legs that seems to help.  He got this at [facility].   Past Medical History:  Diagnosis Date  . BPH (benign prostatic hypertrophy)   . Chronic heart failure ([place])   . Chronic renal insufficiency   . Coronary artery disease    CABG in 2000  . Diabetes type 2, controlled ([place])    Diet-controlled  . Diabetic neuropathy ([place])   . Dyslipidemia   . Hypertension   . ITP (idiopathic thrombocytopenic purpura)   . Osteoarthritis   . Pneumonia     Past Surgical History:  Procedure Laterality Date  . CHOLECYSTECTOMY    . CORONARY ARTERY BYPASS GRAFT  2000  . Right hip replacement  2014  . TONSILLECTOMY      Prior to Admission medications   Medication Sig Start Date End Date Taking? Authorizing Provider  acetaminophen (TYLENOL) 500 MG tablet Take 500 mg by mouth as needed.   Yes [provider]  brimonidine (ALPHAGAN) 0.2 % ophthalmic solution Place 1 drop into both eyes 3 (three) times daily.   Yes [provider]    cycloSPORINE (RESTASIS) 0.05 % ophthalmic emulsion Place 1 drop into both eyes every 12 (twelve) hours.    Yes [provider]  Dextran 70-Hypromellose (ARTIFICIAL TEARS PF OP) Place 1 drop into both eyes 4 (four) times daily.    Yes [provider]  hydroxypropyl methylcellulose / hypromellose (ISOPTO TEARS / GONIOVISC) 2.5 % ophthalmic solution Place 1 drop into both eyes 3 (three) times daily as needed for dry eyes.   Yes [provider]  magnesium oxide (MAG-OX) 400 MG tablet Take 400 mg daily by mouth.   Yes [provider]  Multiple Vitamin (MULTIVITAMIN WITH MINERALS) TABS tablet Take 1 tablet by mouth daily.   Yes [provider]  nitroGLYCERIN (NITROSTAT) 0.4 MG SL tablet Place 0.4 mg under the tongue every 5 (five) minutes as needed for chest pain.   Yes [provider]  omeprazole (PRILOSEC) 20 MG capsule Take 1 capsule (20 mg total) by mouth daily before breakfast. 03/19/17  Yes Rosita Fire, MD  simvastatin (ZOCOR) 20 MG tablet Take 20 mg by mouth daily.   Yes [provider]     Allergies:   Alfuzosin hcl er; Imdur [isosorbide dinitrate]; Penicillins; and Tape    ROS:  Please see the history of present illness.  Otherwise, review of systems are positive for none.   All other systems are reviewed and negative.    PHYSICAL EXAM: VS:  BP (!) 148/68   Pulse (!) 55   Ht 5\' 7"  (1.702 m)   Wt 197 lb 12.8 oz (89.7 kg)   BMI 30.98 kg/m  , BMI Body mass index is 30.98 kg/m.  GENERAL:  Well appearing for his age NECK:  No jugular venous distention, waveform within normal limits, carotid upstroke brisk and symmetric, no bruits, no thyromegaly LUNGS:  Clear to auscultation bilaterally CHEST:  Unremarkable HEART:  PMI not displaced or sustained,S1 and S2 within normal limits, no S3, no S4, no clicks, no rubs, no murmurs ABD:  Flat, positive bowel sounds normal in frequency in pitch, no bruits, no rebound, no  guarding, no midline pulsatile mass, no hepatomegaly, no splenomegaly EXT:  2 plus pulses throughout, no edema, no cyanosis no clubbing   EKG:  EKG is  ordered today. The ekg ordered 10/05/15 demonstrates sinus bradycardia, rate 55, left axis deviation, left bundle branch block.  PACs.    Recent Labs: 03/15/2017: TSH 0.324 05/18/2017: B Natriuretic Peptide 1,129.8; Magnesium 1.7 11/25/2017: ALT 13; BUN 26; Creatinine 1.49; Potassium 4.2; Sodium 143 01/06/2018: Hemoglobin 12.7; Platelets 138    Lipid Panel    Component Value Date/Time   CHOL 92 05/18/2017 0217   CHOL 100 05/23/2015 0919   TRIG 101 05/18/2017 0217   TRIG 126 05/23/2015 0919   HDL 25 (L) 05/18/2017 0217   HDL 29 (L) 05/23/2015 0919   CHOLHDL 3.7 05/18/2017 0217   VLDL 20 05/18/2017 0217   LDLCALC 47 05/18/2017 0217   LDLCALC 46 05/23/2015 0919      Wt Readings from Last 3 Encounters:  01/08/18 197 lb 12.8 oz (89.7 kg)  10/28/17 202 lb 11.8 oz (92 kg)  08/05/17 199 lb 4.8 oz (90.4 kg)      Other studies Reviewed: Additional studies/ records that were reviewed today include: None. Review of the above records demonstrates:    ASSESSMENT AND PLAN:  BRADYCARDIA:   He is not having any symptoms related to this.  No further testing is indicated.   EDEMA: He said this is very mild and not bothering him.  No change in therapy.  CAD:   He did not tolerate Imdur.  He is not having any chest discomfort.  No further evaluation or change in therapy is indicated.   LEG CRAMPING: He still gets a little leg cramping.  He thinks it is better with his over-the-counter therapy.  We did take him off Zocor and I do not see a reason to restart this.  Current medicines are reviewed at length with the patient today.  The patient does not have concerns regarding medicines.  The following changes have been made:  None  Labs/ tests ordered today include:  None  Orders Placed This Encounter  Procedures  . EKG 12-Lead      Disposition:   FU with in one year.    Signed, Minus Breeding, MD  01/08/2018 12:29 PM     [facility]

## 2018-01-08 ENCOUNTER — Ambulatory Visit (INDEPENDENT_AMBULATORY_CARE_PROVIDER_SITE_OTHER): Payer: Medicare Other | Admitting: Cardiology

## 2018-01-08 ENCOUNTER — Encounter: Payer: Self-pay | Admitting: Cardiology

## 2018-01-08 VITALS — BP 148/68 | HR 55 | Ht 67.0 in | Wt 197.8 lb

## 2018-01-08 DIAGNOSIS — I447 Left bundle-branch block, unspecified: Secondary | ICD-10-CM | POA: Diagnosis not present

## 2018-01-08 DIAGNOSIS — I2510 Atherosclerotic heart disease of native coronary artery without angina pectoris: Secondary | ICD-10-CM | POA: Diagnosis not present

## 2018-01-08 DIAGNOSIS — I10 Essential (primary) hypertension: Secondary | ICD-10-CM | POA: Diagnosis not present

## 2018-01-08 NOTE — Patient Instructions (Signed)

## 2018-01-20 ENCOUNTER — Inpatient Hospital Stay: Payer: Medicare Other

## 2018-01-20 ENCOUNTER — Inpatient Hospital Stay (HOSPITAL_BASED_OUTPATIENT_CLINIC_OR_DEPARTMENT_OTHER): Payer: Medicare Other | Admitting: Medical

## 2018-01-20 VITALS — BP 184/68 | HR 53 | Temp 97.7°F | Resp 17 | Ht 67.0 in | Wt 199.4 lb

## 2018-01-20 DIAGNOSIS — R42 Dizziness and giddiness: Secondary | ICD-10-CM

## 2018-01-20 DIAGNOSIS — D693 Immune thrombocytopenic purpura: Secondary | ICD-10-CM

## 2018-01-20 DIAGNOSIS — E860 Dehydration: Secondary | ICD-10-CM | POA: Diagnosis not present

## 2018-01-20 DIAGNOSIS — Z95828 Presence of other vascular implants and grafts: Secondary | ICD-10-CM

## 2018-01-20 LAB — CMP (CANCER CENTER ONLY)
ALBUMIN: 3.4 g/dL — AB (ref 3.5–5.0)
ALT: 12 U/L (ref 0–44)
ANION GAP: 7 (ref 5–15)
AST: 18 U/L (ref 15–41)
Alkaline Phosphatase: 74 U/L (ref 38–126)
BILIRUBIN TOTAL: 0.4 mg/dL (ref 0.3–1.2)
BUN: 28 mg/dL — ABNORMAL HIGH (ref 8–23)
CO2: 24 mmol/L (ref 22–32)
Calcium: 9.1 mg/dL (ref 8.9–10.3)
Chloride: 113 mmol/L — ABNORMAL HIGH (ref 98–111)
Creatinine: 1.7 mg/dL — ABNORMAL HIGH (ref 0.61–1.24)
GFR, EST AFRICAN AMERICAN: 38 mL/min — AB (ref >60)
GFR, EST NON AFRICAN AMERICAN: 33 mL/min — AB (ref >60)
GLUCOSE: 130 mg/dL — AB (ref 70–99)
Potassium: 4.7 mmol/L (ref 3.5–5.1)
Sodium: 144 mmol/L (ref 135–145)
TOTAL PROTEIN: 6.6 g/dL (ref 6.5–8.1)

## 2018-01-20 LAB — CBC WITH DIFFERENTIAL/PLATELET
ABS IMMATURE GRANULOCYTES: 0.03 10*3/uL (ref 0.00–0.07)
BASOS PCT: 0 %
Basophils Absolute: 0 10*3/uL (ref 0.0–0.1)
EOS ABS: 0.1 10*3/uL (ref 0.0–0.5)
Eosinophils Relative: 1 %
HCT: 38.6 % — ABNORMAL LOW (ref 39.0–52.0)
Hemoglobin: 12.4 g/dL — ABNORMAL LOW (ref 13.0–17.0)
IMMATURE GRANULOCYTES: 1 %
Lymphocytes Relative: 24 %
Lymphs Abs: 1.1 10*3/uL (ref 0.7–4.0)
MCH: 31.7 pg (ref 26.0–34.0)
MCHC: 32.1 g/dL (ref 30.0–36.0)
MCV: 98.7 fL (ref 80.0–100.0)
MONOS PCT: 10 %
Monocytes Absolute: 0.5 10*3/uL (ref 0.1–1.0)
NEUTROS PCT: 64 %
NRBC: 0 % (ref 0.0–0.2)
Neutro Abs: 2.9 10*3/uL (ref 1.7–7.7)
PLATELETS: 142 10*3/uL — AB (ref 150–400)
RBC: 3.91 MIL/uL — AB (ref 4.22–5.81)
RDW: 12.9 % (ref 11.5–15.5)
WBC: 4.6 10*3/uL (ref 4.0–10.5)

## 2018-01-20 MED ORDER — ROMIPLOSTIM 250 MCG ~~LOC~~ SOLR
0.5500 ug/kg | SUBCUTANEOUS | Status: DC
  Administered 2018-01-20: 50 ug via SUBCUTANEOUS
  Filled 2018-01-20: qty 0.1

## 2018-01-20 MED ORDER — SODIUM CHLORIDE 0.9 % IV SOLN
INTRAVENOUS | Status: DC
  Administered 2018-01-20: 11:00:00 via INTRAVENOUS
  Filled 2018-01-20 (×2): qty 250

## 2018-01-20 NOTE — Patient Instructions (Signed)
Romiplostim injection What is this medicine? ROMIPLOSTIM (roe mi PLOE stim) helps your body make more platelets. This medicine is used to treat low platelets caused by chronic idiopathic thrombocytopenic purpura (ITP). This medicine may be used for other purposes; ask your health care provider or pharmacist if you have questions. COMMON BRAND NAME(S): Nplate What should I tell my health care provider before I take this medicine? They need to know if you have any of these conditions: -cancer or myelodysplastic syndrome -low blood counts, like low white cell, platelet, or red cell counts -take medicines that treat or prevent blood clots -an unusual or allergic reaction to romiplostim, mannitol, other medicines, foods, dyes, or preservatives -pregnant or trying to get pregnant -breast-feeding How should I use this medicine? This medicine is for injection under the skin. It is given by a health care professional in a hospital or clinic setting. A special MedGuide will be given to you before your injection. Read this information carefully each time. Talk to your pediatrician regarding the use of this medicine in children. Special care may be needed. Overdosage: If you think you have taken too much of this medicine contact a poison control center or emergency room at once. NOTE: This medicine is only for you. Do not share this medicine with others. What if I miss a dose? It is important not to miss your dose. Call your doctor or health care professional if you are unable to keep an appointment. What may interact with this medicine? Interactions are not expected. This list may not describe all possible interactions. Give your health care provider a list of all the medicines, herbs, non-prescription drugs, or dietary supplements you use. Also tell them if you smoke, drink alcohol, or use illegal drugs. Some items may interact with your medicine. What should I watch for while using this  medicine? Your condition will be monitored carefully while you are receiving this medicine. Visit your prescriber or health care professional for regular checks on your progress and for the needed blood tests. It is important to keep all appointments. What side effects may I notice from receiving this medicine? Side effects that you should report to your doctor or health care professional as soon as possible: -allergic reactions like skin rash, itching or hives, swelling of the face, lips, or tongue -shortness of breath, chest pain, swelling in a leg -unusual bleeding or bruising Side effects that usually do not require medical attention (report to your doctor or health care professional if they continue or are bothersome): -dizziness -headache -muscle aches -pain in arms and legs -stomach pain -trouble sleeping This list may not describe all possible side effects. Call your doctor for medical advice about side effects. You may report side effects to FDA at 1-800-FDA-1088. Where should I keep my medicine? This drug is given in a hospital or clinic and will not be stored at home. NOTE: This sheet is a summary. It may not cover all possible information. If you have questions about this medicine, talk to your doctor, pharmacist, or health care provider.  2018 Elsevier/Gold Standard (2007-10-12 15:13:04)  

## 2018-01-20 NOTE — Progress Notes (Signed)
Pt reports improvement in dizziness after receiving IVF.  Tolerated food and oral fluids well.  Pt ambulated to front waiting area to sit with Allie for transportation which was arranged beforehand.  Pt advised to call with any questions or concerns.  Verbalized understanding.

## 2018-01-20 NOTE — Patient Instructions (Signed)

## 2018-01-21 NOTE — Progress Notes (Signed)
[facility] Progress Note   Lawrence Warner [id number] [birth year] 84 y.o.  Lawrence Warner is managed by Dr. Sullivan Lone  Actively treated with chemotherapy/immunotherapy/hormonal therapy: no  Assessment: Plan:    Dehydration - Plan: 0.9 %  sodium chloride infusion  Dizziness  Idiopathic thrombocytopenic purpura (HCC)   Dizziness secondary to dehydration: A chemistry panel returned with a BUN elevated at 28 and creatinine elevated at 1.70.  The patient reports that he has not been drinking much water as he has nocturia.  He was given 1 L of normal saline IV today and had resolution of his dizziness.  ITP: A CBC returned today with a platelet count stable at 142.  He will return for labs on 02/03/2018.  Please see After Visit Summary for patient specific instructions.  Future Appointments  Date Time Provider [place]  02/03/2018  9:15 AM [facility] 3 [facility] None  02/03/2018 10:15 AM [facility] [place] [facility] None  02/17/2018  9:15 AM [facility] LAB 6 [facility] None  02/17/2018 10:15 AM [facility] [place] [facility] None  03/03/2018  9:15 AM [facility] LAB 6 [facility] None  03/03/2018  9:40 AM [name], MD [facility] None  03/03/2018 10:15 AM [facility] [place] [facility] None    No orders of the defined types were placed in this encounter.      Subjective:   Patient ID:  Lawrence Warner is a 84 y.o. (DOB 01/12/1919) male.  Chief Complaint:  Chief Complaint  Patient presents with  . Dizziness    HPI [name] is a 84 year old male with a history of ITP who is managed by Dr. Sullivan Lone.  He was here today for labs when he was noted to have dizziness with positional changes.  Patient reports that when he gets up quickly he will develop dizziness.  This is been occurring for the last 2 weeks.  He reports that his dizziness has slowly been improving except when he bends over.  He does not drink much water.   He has been restricting himself secondary to nocturia.  He denies chest pressure or pain.  Medications: I have reviewed the patient's current medications.  Allergies:  Allergies  Allergen Reactions  . Alfuzosin Hcl Er Other (See Comments)    Confusion/Uroxatral   . Imdur [Isosorbide Dinitrate] Other (See Comments)    REACTION NOT RECALLED  . Penicillins Hives and Other (See Comments)    Has patient had a PCN reaction causing immediate rash, facial/tongue/throat swelling, SOB or lightheadedness with hypotension: Yes Has patient had a PCN reaction causing severe rash involving mucus membranes or skin necrosis: Unknown Has patient had a PCN reaction that required hospitalization: Unknown Has patient had a PCN reaction occurring within the last 10 years: Unknown If all of the above answers are "NO", then may proceed with Cephalosporin use.   . Tape Other (See Comments)    SKIN IS VERY THIN AND WILL TEAR EASILY    Past Medical History:  Diagnosis Date  . BPH (benign prostatic hypertrophy)   . Chronic heart failure ([place])   . Chronic renal insufficiency   . Coronary artery disease    CABG in 2000  . Diabetes type 2, controlled ([place])    Diet-controlled  . Diabetic neuropathy ([place])   . Dyslipidemia   . Hypertension   . ITP (idiopathic thrombocytopenic purpura)   . Osteoarthritis   . Pneumonia     Past Surgical History:  Procedure Laterality Date  . CHOLECYSTECTOMY    .  CORONARY ARTERY BYPASS GRAFT  2000  . Right hip replacement  2014  . TONSILLECTOMY      Family History  Problem Relation Age of Onset  . Lung disease Father   . Cancer Brother     Social History   Socioeconomic History  . Marital status: Single    Spouse name: Not on file  . Number of children: Not on file  . Years of education: Not on file  . Highest education level: Not on file  Occupational History  . Occupation: Teaching laboratory technician  . Financial resource strain: Not on file  . Food  insecurity:    Worry: Not on file    Inability: Not on file  . Transportation needs:    Medical: Not on file    Non-medical: Not on file  Tobacco Use  . Smoking status: Former Smoker    Packs/day: 2.00    Years: 25.00    Pack years: 50.00  . Smokeless tobacco: Never Used  Substance and Sexual Activity  . Alcohol use: No  . Drug use: No  . Sexual activity: Not Currently  Lifestyle  . Physical activity:    Days per week: Not on file    Minutes per session: Not on file  . Stress: Not on file  Relationships  . Social connections:    Talks on phone: Not on file    Gets together: Not on file    Attends religious service: Not on file    Active member of club or organization: Not on file    Attends meetings of clubs or organizations: Not on file    Relationship status: Not on file  . Intimate partner violence:    Fear of current or ex partner: Not on file    Emotionally abused: Not on file    Physically abused: Not on file    Forced sexual activity: Not on file  Other Topics Concern  . Not on file  Social History Narrative    Two children.      Past Medical History, Surgical history, Social history, and Family history were reviewed and updated as appropriate.   Please see review of systems for further details on the patient's review from today.   Review of Systems:  Review of Systems  Constitutional: Negative for appetite change, chills, diaphoresis and fever.  HENT: Negative for dental problem, mouth sores and trouble swallowing.   Respiratory: Negative for cough, chest tightness and shortness of breath.   Cardiovascular: Negative for chest pain and palpitations.  Gastrointestinal: Negative for constipation, diarrhea, nausea and vomiting.  Neurological: Positive for dizziness. Negative for syncope, weakness and headaches.    Objective:   Physical Exam:  BP (!) 184/68 (BP Location: Left Arm, Patient Position: Sitting) Comment: Liza RN is aware  Pulse (!) 53 Comment:  Liza RN is aware  Temp 97.7 F (36.5 C) (Oral)   Resp 17   Ht 5\' 7"  (1.702 m)   Wt 199 lb 6.4 oz (90.4 kg)   SpO2 96%   BMI 31.23 kg/m  ECOG: 0  Physical Exam  Constitutional: No distress.  HENT:  Head: Normocephalic and atraumatic.  Cardiovascular: Normal rate, regular rhythm and normal heart sounds. Exam reveals no gallop and no friction rub.  No murmur heard. Pulmonary/Chest: Effort normal and breath sounds normal. No respiratory distress. He has no wheezes. He has no rales.  Neurological: He is alert. Coordination (The patient is ambulating with use of a cane.) abnormal.  Skin:  Skin is warm and dry. No rash noted. He is not diaphoretic. No erythema.    Lab Review:     Component Value Date/Time   NA 144 01/20/2018 0909   NA 141 12/03/2016 0910   K 4.7 01/20/2018 0909   K 4.5 12/03/2016 0910   CL 113 (H) 01/20/2018 0909   CO2 24 01/20/2018 0909   CO2 24 12/03/2016 0910   GLUCOSE 130 (H) 01/20/2018 0909   GLUCOSE 169 (H) 12/03/2016 0910   BUN 28 (H) 01/20/2018 0909   BUN 31.8 (H) 12/03/2016 0910   CREATININE 1.70 (H) 01/20/2018 0909   CREATININE 1.7 (H) 12/03/2016 0910   CALCIUM 9.1 01/20/2018 0909   CALCIUM 9.6 12/03/2016 0910   PROT 6.6 01/20/2018 0909   PROT 6.5 12/03/2016 0910   ALBUMIN 3.4 (L) 01/20/2018 0909   ALBUMIN 3.3 (L) 12/03/2016 0910   AST 18 01/20/2018 0909   AST 20 12/03/2016 0910   ALT 12 01/20/2018 0909   ALT 16 12/03/2016 0910   ALKPHOS 74 01/20/2018 0909   ALKPHOS 76 12/03/2016 0910   BILITOT 0.4 01/20/2018 0909   BILITOT 0.36 12/03/2016 0910   GFRNONAA 33 (L) 01/20/2018 0909   GFRAA 38 (L) 01/20/2018 0909       Component Value Date/Time   WBC 4.6 01/20/2018 0909   RBC 3.91 (L) 01/20/2018 0909   HGB 12.4 (L) 01/20/2018 0909   HGB 11.9 (L) 04/15/2017 0904   HGB 13.4 02/26/2017 0927   HCT 38.6 (L) 01/20/2018 0909   HCT 40.9 02/26/2017 0927   PLT 142 (L) 01/20/2018 0909   PLT 297 04/15/2017 0904   PLT 176 02/26/2017 0927   MCV 98.7  01/20/2018 0909   MCV 92.6 02/26/2017 0927   MCH 31.7 01/20/2018 0909   MCHC 32.1 01/20/2018 0909   RDW 12.9 01/20/2018 0909   RDW 13.5 02/26/2017 0927   LYMPHSABS 1.1 01/20/2018 0909   LYMPHSABS 1.1 02/26/2017 0927   MONOABS 0.5 01/20/2018 0909   MONOABS 0.7 02/26/2017 0927   EOSABS 0.1 01/20/2018 0909   EOSABS 0.1 02/26/2017 0927   BASOSABS 0.0 01/20/2018 0909   BASOSABS 0.0 02/26/2017 0927   -------------------------------  Imaging from last 24 hours (if applicable):  Radiology interpretation: No results found.

## 2018-01-31 DIAGNOSIS — M6281 Muscle weakness (generalized): Secondary | ICD-10-CM | POA: Diagnosis not present

## 2018-01-31 DIAGNOSIS — E119 Type 2 diabetes mellitus without complications: Secondary | ICD-10-CM | POA: Diagnosis not present

## 2018-01-31 DIAGNOSIS — I5022 Chronic systolic (congestive) heart failure: Secondary | ICD-10-CM | POA: Diagnosis not present

## 2018-01-31 DIAGNOSIS — M7989 Other specified soft tissue disorders: Secondary | ICD-10-CM | POA: Diagnosis not present

## 2018-01-31 DIAGNOSIS — I10 Essential (primary) hypertension: Secondary | ICD-10-CM | POA: Diagnosis not present

## 2018-02-03 ENCOUNTER — Inpatient Hospital Stay: Payer: Medicare Other

## 2018-02-03 ENCOUNTER — Inpatient Hospital Stay: Payer: Medicare Other | Attending: Hematology

## 2018-02-03 DIAGNOSIS — D693 Immune thrombocytopenic purpura: Secondary | ICD-10-CM

## 2018-02-03 DIAGNOSIS — E538 Deficiency of other specified B group vitamins: Secondary | ICD-10-CM | POA: Insufficient documentation

## 2018-02-03 DIAGNOSIS — Z95828 Presence of other vascular implants and grafts: Secondary | ICD-10-CM

## 2018-02-03 LAB — CBC WITH DIFFERENTIAL/PLATELET
Abs Immature Granulocytes: 0.04 10*3/uL (ref 0.00–0.07)
BASOS ABS: 0 10*3/uL (ref 0.0–0.1)
Basophils Relative: 0 %
Eosinophils Absolute: 0 10*3/uL (ref 0.0–0.5)
Eosinophils Relative: 1 %
HEMATOCRIT: 39.4 % (ref 39.0–52.0)
HEMOGLOBIN: 12.5 g/dL — AB (ref 13.0–17.0)
Immature Granulocytes: 1 %
LYMPHS ABS: 1.2 10*3/uL (ref 0.7–4.0)
LYMPHS PCT: 23 %
MCH: 31.3 pg (ref 26.0–34.0)
MCHC: 31.7 g/dL (ref 30.0–36.0)
MCV: 98.7 fL (ref 80.0–100.0)
Monocytes Absolute: 0.6 10*3/uL (ref 0.1–1.0)
Monocytes Relative: 11 %
NEUTROS ABS: 3.3 10*3/uL (ref 1.7–7.7)
NRBC: 0 % (ref 0.0–0.2)
Neutrophils Relative %: 64 %
Platelets: 164 10*3/uL (ref 150–400)
RBC: 3.99 MIL/uL — AB (ref 4.22–5.81)
RDW: 13 % (ref 11.5–15.5)
WBC: 5.1 10*3/uL (ref 4.0–10.5)

## 2018-02-03 MED ORDER — ROMIPLOSTIM 250 MCG ~~LOC~~ SOLR
0.5500 ug/kg | SUBCUTANEOUS | Status: DC
  Administered 2018-02-03: 50 ug via SUBCUTANEOUS
  Filled 2018-02-03: qty 0.1

## 2018-02-03 MED ORDER — CYANOCOBALAMIN 1000 MCG/ML IJ SOLN
INTRAMUSCULAR | Status: AC
  Filled 2018-02-03: qty 1

## 2018-02-03 MED ORDER — CYANOCOBALAMIN 1000 MCG/ML IJ SOLN
1000.0000 ug | INTRAMUSCULAR | Status: DC
  Administered 2018-02-03: 1000 ug via SUBCUTANEOUS

## 2018-02-06 ENCOUNTER — Encounter (HOSPITAL_COMMUNITY): Payer: Self-pay

## 2018-02-06 ENCOUNTER — Emergency Department (HOSPITAL_COMMUNITY)
Admission: EM | Admit: 2018-02-06 | Discharge: 2018-02-06 | Disposition: A | Discharge status: Home / Self Care | Payer: Medicare Other | Attending: Emergency Medicine | Admitting: Emergency Medicine

## 2018-02-06 ENCOUNTER — Emergency Department (HOSPITAL_COMMUNITY): Payer: Medicare Other

## 2018-02-06 ENCOUNTER — Other Ambulatory Visit: Payer: Self-pay

## 2018-02-06 DIAGNOSIS — R062 Wheezing: Secondary | ICD-10-CM

## 2018-02-06 DIAGNOSIS — Z79899 Other long term (current) drug therapy: Secondary | ICD-10-CM | POA: Insufficient documentation

## 2018-02-06 DIAGNOSIS — I10 Essential (primary) hypertension: Secondary | ICD-10-CM | POA: Diagnosis not present

## 2018-02-06 DIAGNOSIS — I2510 Atherosclerotic heart disease of native coronary artery without angina pectoris: Secondary | ICD-10-CM | POA: Insufficient documentation

## 2018-02-06 DIAGNOSIS — I130 Hypertensive heart and chronic kidney disease with heart failure and stage 1 through stage 4 chronic kidney disease, or unspecified chronic kidney disease: Secondary | ICD-10-CM | POA: Diagnosis not present

## 2018-02-06 DIAGNOSIS — E1140 Type 2 diabetes mellitus with diabetic neuropathy, unspecified: Secondary | ICD-10-CM | POA: Diagnosis not present

## 2018-02-06 DIAGNOSIS — Z951 Presence of aortocoronary bypass graft: Secondary | ICD-10-CM | POA: Diagnosis not present

## 2018-02-06 DIAGNOSIS — J189 Pneumonia, unspecified organism: Secondary | ICD-10-CM | POA: Diagnosis not present

## 2018-02-06 DIAGNOSIS — E785 Hyperlipidemia, unspecified: Secondary | ICD-10-CM | POA: Insufficient documentation

## 2018-02-06 DIAGNOSIS — I5032 Chronic diastolic (congestive) heart failure: Secondary | ICD-10-CM | POA: Diagnosis not present

## 2018-02-06 DIAGNOSIS — J322 Chronic ethmoidal sinusitis: Secondary | ICD-10-CM | POA: Diagnosis not present

## 2018-02-06 DIAGNOSIS — N183 Chronic kidney disease, stage 3 (moderate): Secondary | ICD-10-CM | POA: Insufficient documentation

## 2018-02-06 DIAGNOSIS — Z87891 Personal history of nicotine dependence: Secondary | ICD-10-CM | POA: Insufficient documentation

## 2018-02-06 DIAGNOSIS — J320 Chronic maxillary sinusitis: Secondary | ICD-10-CM | POA: Diagnosis not present

## 2018-02-06 DIAGNOSIS — R0602 Shortness of breath: Secondary | ICD-10-CM | POA: Diagnosis present

## 2018-02-06 DIAGNOSIS — J040 Acute laryngitis: Secondary | ICD-10-CM | POA: Diagnosis not present

## 2018-02-06 DIAGNOSIS — R05 Cough: Secondary | ICD-10-CM | POA: Diagnosis not present

## 2018-02-06 LAB — COMPREHENSIVE METABOLIC PANEL
ALT: 17 U/L (ref 0–44)
AST: 29 U/L (ref 15–41)
Albumin: 3.7 g/dL (ref 3.5–5.0)
Alkaline Phosphatase: 61 U/L (ref 38–126)
Anion gap: 12 (ref 5–15)
BUN: 16 mg/dL (ref 8–23)
CO2: 23 mmol/L (ref 22–32)
Calcium: 9.1 mg/dL (ref 8.9–10.3)
Chloride: 105 mmol/L (ref 98–111)
Creatinine, Ser: 1.6 mg/dL — ABNORMAL HIGH (ref 0.61–1.24)
GFR calc Af Amer: 41 mL/min — ABNORMAL LOW (ref >60)
GFR calc non Af Amer: 36 mL/min — ABNORMAL LOW (ref >60)
Glucose, Bld: 126 mg/dL — ABNORMAL HIGH (ref 70–99)
Potassium: 4.1 mmol/L (ref 3.5–5.1)
Sodium: 140 mmol/L (ref 135–145)
Total Bilirubin: 0.7 mg/dL (ref 0.3–1.2)
Total Protein: 7.2 g/dL (ref 6.5–8.1)

## 2018-02-06 LAB — CBC WITH DIFFERENTIAL/PLATELET
Abs Immature Granulocytes: 0.03 10*3/uL (ref 0.00–0.07)
Basophils Absolute: 0 10*3/uL (ref 0.0–0.1)
Basophils Relative: 0 %
EOS ABS: 0 10*3/uL (ref 0.0–0.5)
Eosinophils Relative: 0 %
HEMATOCRIT: 43.9 % (ref 39.0–52.0)
Hemoglobin: 13.7 g/dL (ref 13.0–17.0)
Immature Granulocytes: 1 %
LYMPHS ABS: 1.8 10*3/uL (ref 0.7–4.0)
Lymphocytes Relative: 31 %
MCH: 31.1 pg (ref 26.0–34.0)
MCHC: 31.2 g/dL (ref 30.0–36.0)
MCV: 99.5 fL (ref 80.0–100.0)
Monocytes Absolute: 0.7 10*3/uL (ref 0.1–1.0)
Monocytes Relative: 11 %
Neutro Abs: 3.3 10*3/uL (ref 1.7–7.7)
Neutrophils Relative %: 57 %
Platelets: 124 10*3/uL — ABNORMAL LOW (ref 150–400)
RBC: 4.41 MIL/uL (ref 4.22–5.81)
RDW: 12.7 % (ref 11.5–15.5)
WBC: 5.9 10*3/uL (ref 4.0–10.5)
nRBC: 0 % (ref 0.0–0.2)

## 2018-02-06 LAB — TROPONIN I
Troponin I: 0.03 ng/mL (ref <0.03)
Troponin I: 0.03 ng/mL (ref <0.03)

## 2018-02-06 LAB — GROUP A STREP BY PCR: Group A Strep by PCR: NOT DETECTED

## 2018-02-06 MED ORDER — IPRATROPIUM-ALBUTEROL 0.5-2.5 (3) MG/3ML IN SOLN
3.0000 mL | Freq: Once | RESPIRATORY_TRACT | Status: AC
  Administered 2018-02-06: 3 mL via RESPIRATORY_TRACT
  Filled 2018-02-06: qty 3

## 2018-02-06 MED ORDER — ALBUTEROL SULFATE HFA 108 (90 BASE) MCG/ACT IN AERS
2.0000 | INHALATION_SPRAY | RESPIRATORY_TRACT | Status: AC
  Administered 2018-02-06: 2 via RESPIRATORY_TRACT
  Filled 2018-02-06: qty 6.7

## 2018-02-06 MED ORDER — DOXYCYCLINE HYCLATE 100 MG PO CAPS: 100.0000 mg | ORAL_CAPSULE | Freq: Two times a day (BID) | ORAL | 0 refills | Status: DC

## 2018-02-06 MED ORDER — DOXYCYCLINE HYCLATE 100 MG PO TABS
100.0000 mg | ORAL_TABLET | Freq: Once | ORAL | Status: AC
  Administered 2018-02-06: 100 mg via ORAL
  Filled 2018-02-06: qty 1

## 2018-02-06 NOTE — ED Triage Notes (Signed)
Pt to ER with c/o SOB worsening for approx a week. Family reports worsening since yesterday; pt denies chill and fever.

## 2018-02-06 NOTE — ED Notes (Signed)
Th RN took pt off of all monitors per pt and family request. Pt is becoming more and more agitated. Requesting to leave. Pt made aware that the last lab value we are waiting on should be back momentarily. Pt agreeable to wait for this result at this time.

## 2018-02-06 NOTE — ED Notes (Signed)
Patient verbalizes understanding of discharge instructions. Opportunity for questioning and answers were provided. 

## 2018-02-06 NOTE — ED Provider Notes (Signed)
[facility] EMERGENCY DEPARTMENT Provider Note   CSN: [id number] Arrival date & time: 02/06/18  2876     History   Chief Complaint Chief Complaint  Patient presents with  . Shortness of Breath    HPI Lawrence Warner is a 84 y.o. male.  HPI  84 year old male, he has a known history of chronic heart failure, renal insufficiency, prior history of coronary disease status post bypass grafting 20 years ago, he is a diabetic, has hypertension and dyslipidemia.  He presents to the hospital today in the care of his daughter-in-law, he is coming from the office of Dr. Claria Dice, and ear nose and throat physician who referred the patient to the emergency department because of lung congestion.  Reportedly the patient has a history of chronic pharyngitis secondary to being a mouth breather, he was there to be evaluated for that and because of wheezing and coughing was sent to the hospital.  The patient denies swelling of the legs or fevers or chills.  He has no nausea vomiting or diarrhea and denies any chest pain at this time.  He does report 2 weeks of coughing, congestion, some mucus, some wheezing, denies tobacco use.  The symptoms are persistent, nothing seems to make it better or worse.  He does not use home oxygen.  Past Medical History:  Diagnosis Date  . BPH (benign prostatic hypertrophy)   . Chronic heart failure ([place])   . Chronic renal insufficiency   . Coronary artery disease    CABG in 2000  . Diabetes type 2, controlled ([place])    Diet-controlled  . Diabetic neuropathy ([place])   . Dyslipidemia   . Hypertension   . ITP (idiopathic thrombocytopenic purpura)   . Osteoarthritis   . Pneumonia     Patient Active Problem List   Diagnosis Date Noted  . Coronary artery disease involving native coronary artery of native heart without angina pectoris 01/08/2018  . Generalized weakness 05/17/2017  . Hypertensive emergency 05/17/2017  . RSV bronchitis   .  Bronchospasm with bronchitis, acute 03/14/2017  . Acute on chronic diastolic CHF (congestive heart failure) ([place]) 11/26/2016  . CRI (chronic renal insufficiency), stage 3 (moderate) ([place]) 11/26/2016  . LBBB (left bundle branch block) 11/26/2016  . Hypoxia   . Chronic respiratory failure with hypoxia ([place]) 02/28/2016  . Dyslipidemia 02/28/2016  . Constipation 12/07/2015  . Bradycardia 11/28/2015  . Port catheter in place 10/24/2015  . GERD (gastroesophageal reflux disease) 10/09/2015  . Gait instability 10/09/2015  . Chest pain 10/05/2015  . Diet-controlled diabetes mellitus ([place]) 05/07/2015  . Hx of CABG 05/07/2015  . Essential hypertension 05/07/2015  . Muscle cramps 04/25/2015  . Shortness of breath 04/11/2015  . Idiopathic thrombocytopenic purpura ([place]) 09/27/2014    Past Surgical History:  Procedure Laterality Date  . CHOLECYSTECTOMY    . CORONARY ARTERY BYPASS GRAFT  2000  . Right hip replacement  2014  . TONSILLECTOMY          Home Medications    Prior to Admission medications   Medication Sig Start Date End Date Taking? Authorizing Provider  acetaminophen (TYLENOL) 500 MG tablet Take 1,500 mg by mouth daily.    Yes [provider]  brimonidine (ALPHAGAN) 0.2 % ophthalmic solution Place 1 drop into both eyes 3 (three) times daily.   Yes [provider]  cycloSPORINE (RESTASIS) 0.05 % ophthalmic emulsion Place 1 drop into both eyes every 12 (twelve) hours.    Yes [provider]  Dextran  70-Hypromellose (ARTIFICIAL TEARS PF OP) Place 1 drop into both eyes 4 (four) times daily.    Yes [provider]  hydroxypropyl methylcellulose / hypromellose (ISOPTO TEARS / GONIOVISC) 2.5 % ophthalmic solution Place 1 drop into both eyes 3 (three) times daily as needed for dry eyes.   Yes [provider]  losartan (COZAAR) 100 MG tablet Take 100 mg by mouth daily.   Yes [provider]  magnesium oxide (MAG-OX) 400 MG tablet Take  400 mg daily by mouth.   Yes [provider]  Multiple Vitamin (MULTIVITAMIN WITH MINERALS) TABS tablet Take 1 tablet by mouth daily.   Yes [provider]  omeprazole (PRILOSEC) 20 MG capsule Take 1 capsule (20 mg total) by mouth daily before breakfast. 03/19/17  Yes Rosita Fire, MD  simvastatin (ZOCOR) 20 MG tablet Take 20 mg by mouth daily.   Yes [provider]  doxycycline (VIBRAMYCIN) 100 MG capsule Take 1 capsule (100 mg total) by mouth 2 (two) times daily. 02/06/18   Noemi Chapel, MD    Family History Family History  Problem Relation Age of Onset  . Lung disease Father   . Cancer Brother     Social History Social History   Tobacco Use  . Smoking status: Former Smoker    Packs/day: 2.00    Years: 25.00    Pack years: 50.00  . Smokeless tobacco: Never Used  Substance Use Topics  . Alcohol use: No  . Drug use: No     Allergies   Alfuzosin hcl er; Imdur [isosorbide dinitrate]; Penicillins; and Tape   Review of Systems Review of Systems  All other systems reviewed and are negative.    Physical Exam Updated Vital Signs BP (!) 184/54   Pulse 63   Resp (!) 29   SpO2 92%   Physical Exam Vitals signs and nursing note reviewed.  Constitutional:      General: He is not in acute distress.    Appearance: He is well-developed.  HENT:     Head: Normocephalic and atraumatic.     Comments: The oropharynx is clear, it is slightly dehydrated and erythematous, there is no exudate asymmetry or hypertrophy, phonation is normal    Mouth/Throat:     Mouth: Mucous membranes are dry.     Pharynx: Posterior oropharyngeal erythema present. No oropharyngeal exudate.  Eyes:     General: No scleral icterus.       Right eye: No discharge.        Left eye: No discharge.     Conjunctiva/sclera: Conjunctivae normal.     Pupils: Pupils are equal, round, and reactive to light.  Neck:     Musculoskeletal: Normal range of motion and neck supple.      Thyroid: No thyromegaly.     Vascular: No JVD.  Cardiovascular:     Rate and Rhythm: Normal rate and regular rhythm.     Heart sounds: Normal heart sounds. No murmur. No friction rub. No gallop.   Pulmonary:     Effort: Pulmonary effort is normal. No respiratory distress.     Breath sounds: Wheezing present. No rales.     Comments: The patient is able to speak in just short and sentences, he has a slight wheezing, there is no rales or rhonchi, no increased work of breathing or accessory muscle use Abdominal:     General: Bowel sounds are normal. There is no distension.     Palpations: Abdomen is soft. There is no  mass.     Tenderness: There is no abdominal tenderness.  Musculoskeletal: Normal range of motion.        General: No tenderness.     Right lower leg: Edema present.     Left lower leg: Edema present.     Comments: Trace pitting edema at the bilateral ankles  Lymphadenopathy:     Cervical: No cervical adenopathy.  Skin:    General: Skin is warm and dry.     Findings: No erythema or rash.  Neurological:     Mental Status: He is alert.     Coordination: Coordination normal.  Psychiatric:        Behavior: Behavior normal.      ED Treatments / Results  Labs (all labs ordered are listed, but only abnormal results are displayed) Labs Reviewed  CBC WITH DIFFERENTIAL/PLATELET - Abnormal; Notable for the following components:      Result Value   Platelets 124 (*)    All other components within normal limits  COMPREHENSIVE METABOLIC PANEL - Abnormal; Notable for the following components:   Glucose, Bld 126 (*)    Creatinine, Ser 1.60 (*)    GFR calc non Af Amer 36 (*)    GFR calc Af Amer 41 (*)    All other components within normal limits  TROPONIN I - Abnormal; Notable for the following components:   Troponin I 0.03 (*)    All other components within normal limits  GROUP A STREP BY PCR  TROPONIN I    EKG EKG Interpretation  Date/Time:  Friday February 06 2018  10:06:56 EST Ventricular Rate:  77 PR Interval:    QRS Duration: 165 QT Interval:  431 QTC Calculation: 488 R Axis:   -59 Text Interpretation:  Sinus or ectopic atrial rhythm Atrial premature complex Prolonged PR interval Probable left atrial enlargement Left bundle branch block ST elevation slightly higher than on prior in V3 alone - othewise, no sig chnages Confirmed by Noemi Chapel [phone]) on 02/06/2018 10:11:41 AM   Radiology Dg Chest 2 View  Result Date: 02/06/2018 CLINICAL DATA:  Chest congestion. Nonproductive cough. Wheezing. EXAM: CHEST - 2 VIEW COMPARISON:  05/20/2017 FINDINGS: Previous median sternotomy and CABG. Heart size upper limits of normal. Pulmonary vascularity within normal limits. No effusions. Mild chronic pulmonary scarring. No evidence of consolidation or collapse. No acute bone finding. IMPRESSION: Previous CABG. Chronic pulmonary scarring. No active process evident. Electronically Signed   By: Nelson Chimes M.D.   On: 02/06/2018 10:56    Procedures Procedures (including critical care time)  Medications Ordered in ED Medications  ipratropium-albuterol (DUONEB) 0.5-2.5 (3) MG/3ML nebulizer solution 3 mL (3 mLs Nebulization Given 02/06/18 1102)  doxycycline (VIBRA-TABS) tablet 100 mg (100 mg Oral Given 02/06/18 1547)  albuterol (PROVENTIL HFA;VENTOLIN HFA) 108 (90 Base) MCG/ACT inhaler 2 puff (2 puffs Inhalation Given 02/06/18 1544)     Initial Impression / Assessment and Plan / ED Course  I have reviewed the triage vital signs and the nursing notes.  Pertinent labs & imaging results that were available during my care of the patient were reviewed by me and considered in my medical decision making (see chart for details).  Clinical Course as of Feb 07 1616  Fri Feb 06, 2018  1106 The patient has chronic findings on x-ray, no signs of pneumothorax or edema or infiltrate.   [BM]  9323 The metabolic panel reviewed, creatinine is at baseline, CBC is totally  normal except for mild thrombocytopenia, which has been  seen on prior blood counts in the past   [BM]  1256 No strep throat, troponin is 0.03.  Will need second troponin.  Chest x-ray negative.   [BM]  1508 The patient was reexamined, he continues to have wheezing but sats are maintained at 94%, respiratory rate of 20, no tachycardia, no hypotension, no fever, no leukocytosis, the patient is well-appearing otherwise.  He is aware of all of his results.  Second troponin pending.  He will be discharged with an albuterol inhaler and a prescription for doxycycline.  I had a discussion with the patient regarding antibiotic use, given his use of Zithromax frequently I would recommend against using it again in this scenario.   [BM]    Clinical Course User Index [BM] Noemi Chapel, MD    The patient's EKG is very similar to prior with a history of a left bundle branch block.  He does not have any chest pain, his shortness of breath is been constant for 2 weeks with slight cough and wheeze, his throat is cyclically uncomfortable and he states that is similar to prior episodes for which she was seen at the doctor's office today.  Referred here for further pulmonary evaluation which I agree with.  Albuterol DuoNeb, chest x-ray, check for strep, labs, reevaluate.  Patient agreeable  I have given the patient a albuterol inhaler with a spacer, he has received teaching, he received a dose of doxycycline, his labs and chest x-ray were unremarkable and reassuring, his vital signs have shown no tachycardia fever or hypotension.  At this time the patient will undergo a second troponin as his initial troponin was 0.03.  He is not having chest pain.  Change of shift - care signed out to dr. Wilson Singer to follow up results and disposition according to patients condition and results.  Final Clinical Impressions(s) / ED Diagnoses   Final diagnoses:  Essential hypertension  Wheezing      Noemi Chapel, MD 02/06/18  [phone]

## 2018-02-06 NOTE — Discharge Instructions (Signed)
Thankfully your x-ray shows no signs of pneumonia Please take doxycycline twice a day for the next 10 days to treat for presumed infection Use the albuterol inhaler 2 puffs every 4 hours as needed for wheezing, coughing and shortness of breath Return to the emergency department immediately for any severe or worsening symptoms including increasing shortness of breath chest pain See your doctor on Monday without fail for a recheck

## 2018-02-14 DIAGNOSIS — E119 Type 2 diabetes mellitus without complications: Secondary | ICD-10-CM | POA: Diagnosis not present

## 2018-02-14 DIAGNOSIS — M6281 Muscle weakness (generalized): Secondary | ICD-10-CM | POA: Diagnosis not present

## 2018-02-14 DIAGNOSIS — I5022 Chronic systolic (congestive) heart failure: Secondary | ICD-10-CM | POA: Diagnosis not present

## 2018-02-14 DIAGNOSIS — J189 Pneumonia, unspecified organism: Secondary | ICD-10-CM | POA: Diagnosis not present

## 2018-02-14 DIAGNOSIS — I10 Essential (primary) hypertension: Secondary | ICD-10-CM | POA: Diagnosis not present

## 2018-02-17 ENCOUNTER — Inpatient Hospital Stay: Payer: Medicare Other

## 2018-02-17 DIAGNOSIS — D693 Immune thrombocytopenic purpura: Secondary | ICD-10-CM

## 2018-02-17 DIAGNOSIS — E538 Deficiency of other specified B group vitamins: Secondary | ICD-10-CM | POA: Diagnosis not present

## 2018-02-17 DIAGNOSIS — Z95828 Presence of other vascular implants and grafts: Secondary | ICD-10-CM

## 2018-02-17 LAB — CBC WITH DIFFERENTIAL/PLATELET
Abs Immature Granulocytes: 0.06 10*3/uL (ref 0.00–0.07)
BASOS ABS: 0 10*3/uL (ref 0.0–0.1)
Basophils Relative: 0 %
Eosinophils Absolute: 0.1 10*3/uL (ref 0.0–0.5)
Eosinophils Relative: 1 %
HCT: 42.1 % (ref 39.0–52.0)
Hemoglobin: 13.2 g/dL (ref 13.0–17.0)
Immature Granulocytes: 1 %
Lymphocytes Relative: 21 %
Lymphs Abs: 1.7 10*3/uL (ref 0.7–4.0)
MCH: 31.1 pg (ref 26.0–34.0)
MCHC: 31.4 g/dL (ref 30.0–36.0)
MCV: 99.1 fL (ref 80.0–100.0)
Monocytes Absolute: 0.9 10*3/uL (ref 0.1–1.0)
Monocytes Relative: 11 %
Neutro Abs: 5.2 10*3/uL (ref 1.7–7.7)
Neutrophils Relative %: 66 %
PLATELETS: 191 10*3/uL (ref 150–400)
RBC: 4.25 MIL/uL (ref 4.22–5.81)
RDW: 12.7 % (ref 11.5–15.5)
WBC: 7.8 10*3/uL (ref 4.0–10.5)
nRBC: 0 % (ref 0.0–0.2)

## 2018-02-17 MED ORDER — ROMIPLOSTIM 250 MCG ~~LOC~~ SOLR
50.0000 ug | SUBCUTANEOUS | Status: DC
  Administered 2018-02-17: 50 ug via SUBCUTANEOUS
  Filled 2018-02-17: qty 0.1

## 2018-03-02 NOTE — Progress Notes (Signed)
.    Hematology Oncology Clinic note:   Date of service: 03/03/18   Patient Care Team: Clovia Cuff, MD as PCP - General (Internal Medicine) Minus Breeding, MD as PCP - Cardiology (Cardiology) Rico Sheehan (Hematology and Oncology)  CHIEF COMPLAINTS/PURPOSE OF CONSULTATION: Follow-up for ITP   Diagnosis: Idiopathic thrombocytopenic purpura   Current treatment: Nplate q2 weekly to maintain reasonable platelet counts >50K.  Requirement have ranged from 58mcg/kg to 41mcg/kg. Recently stable counts with 0.5-1 mcg/kg qweekly  Previous treatment: Steroids, IVIG.  HISTORY OF PRESENTING ILLNESS:  please see my previous clinic note from 09/28/2014 for details of initial presentation and course of treatment.  Interval History:   Lawrence Warner presents to the clinic today for f/u of his ITP. The patient's last visit with [place] was on 10/28/17. The pt reports that he is doing well overall.   The pt reports that he did recently have a cold in the last few weeks, which has resolved, and he is up to date with his pneumovax, prevnar, and flu vaccines.   He notes that he has continued eating well and has had stable weight. He has enjoyed companionship with a friend where he lives and is pleased with life.  The pt notes that he has continued to tolerate NPlate injections very well.   Lab results today (03/03/18) of CBC w/diff and CMP is as follows: all values are WNL except for RBC at 4.02, HGB at 12.5, HCT at 38.4, PLT at 118k.  On review of systems, pt reports stable energy levels, eating well, stable weight, and denies concern for current infection, and any other symptoms.    MEDICAL HISTORY:  Past Medical History:  Diagnosis Date  . BPH (benign prostatic hypertrophy)   . Chronic heart failure ([place])   . Chronic renal insufficiency   . Coronary artery disease    CABG in 2000  . Diabetes type 2, controlled ([place])    Diet-controlled  . Diabetic neuropathy ([place])   . Dyslipidemia   .  Hypertension   . ITP (idiopathic thrombocytopenic purpura)   . Osteoarthritis   . Pneumonia     SURGICAL HISTORY: Past Surgical History:  Procedure Laterality Date  . CHOLECYSTECTOMY    . CORONARY ARTERY BYPASS GRAFT  2000  . Right hip replacement  2014  . TONSILLECTOMY      SOCIAL HISTORY: Social History   Socioeconomic History  . Marital status: Single    Spouse name: Not on file  . Number of children: Not on file  . Years of education: Not on file  . Highest education level: Not on file  Occupational History  . Occupation: Teaching laboratory technician  . Financial resource strain: Not on file  . Food insecurity:    Worry: Not on file    Inability: Not on file  . Transportation needs:    Medical: Not on file    Non-medical: Not on file  Tobacco Use  . Smoking status: Former Smoker    Packs/day: 2.00    Years: 25.00    Pack years: 50.00  . Smokeless tobacco: Never Used  Substance and Sexual Activity  . Alcohol use: No  . Drug use: No  . Sexual activity: Not Currently  Lifestyle  . Physical activity:    Days per week: Not on file    Minutes per session: Not on file  . Stress: Not on file  Relationships  . Social connections:    Talks on phone: Not on file  Gets together: Not on file    Attends religious service: Not on file    Active member of club or organization: Not on file    Attends meetings of clubs or organizations: Not on file    Relationship status: Not on file  . Intimate partner violence:    Fear of current or ex partner: Not on file    Emotionally abused: Not on file    Physically abused: Not on file    Forced sexual activity: Not on file  Other Topics Concern  . Not on file  Social History Narrative    Two children.     He is living in [place] which is an assisted living facility. One of his son is also moving to [facility] and has a Designer, jewellery in religious studies. Patient notes that his other son is a Systems analyst with  multiple awards.  FAMILY HISTORY: Family History  Problem Relation Age of Onset  . Lung disease Father   . Cancer Brother     ALLERGIES:  is allergic to alfuzosin hcl er; imdur [isosorbide dinitrate]; penicillins; and tape.  MEDICATIONS:  Current Outpatient Medications  Medication Sig Dispense Refill  . acetaminophen (TYLENOL) 500 MG tablet Take 1,500 mg by mouth daily.     . brimonidine (ALPHAGAN) 0.2 % ophthalmic solution Place 1 drop into both eyes 3 (three) times daily.    . cycloSPORINE (RESTASIS) 0.05 % ophthalmic emulsion Place 1 drop into both eyes every 12 (twelve) hours.     . Dextran 70-Hypromellose (ARTIFICIAL TEARS PF OP) Place 1 drop into both eyes 4 (four) times daily.     Marland Kitchen doxycycline (VIBRAMYCIN) 100 MG capsule Take 1 capsule (100 mg total) by mouth 2 (two) times daily. 20 capsule 0  . hydroxypropyl methylcellulose / hypromellose (ISOPTO TEARS / GONIOVISC) 2.5 % ophthalmic solution Place 1 drop into both eyes 3 (three) times daily as needed for dry eyes.    Marland Kitchen losartan (COZAAR) 100 MG tablet Take 100 mg by mouth daily.    . magnesium oxide (MAG-OX) 400 MG tablet Take 400 mg daily by mouth.    . Multiple Vitamin (MULTIVITAMIN WITH MINERALS) TABS tablet Take 1 tablet by mouth daily.    Marland Kitchen omeprazole (PRILOSEC) 20 MG capsule Take 1 capsule (20 mg total) by mouth daily before breakfast.    . simvastatin (ZOCOR) 20 MG tablet Take 20 mg by mouth daily.     No current facility-administered medications for this visit.    Facility-Administered Medications Ordered in Other Visits  Medication Dose Route Frequency Provider Last Rate Last Dose  . cyanocobalamin ((VITAMIN B-12)) injection 1,000 mcg  1,000 mcg Subcutaneous Q30 days [name], MD   1,000 mcg at 03/13/16 1348  . cyanocobalamin ((VITAMIN B-12)) injection 1,000 mcg  1,000 mcg Subcutaneous Q30 days [name], MD   1,000 mcg at 05/14/16 1001  . cyanocobalamin ((VITAMIN B-12)) injection 1,000 mcg   1,000 mcg Subcutaneous Q30 days [name], MD   1,000 mcg at 09/10/16 1022  . cyanocobalamin ((VITAMIN B-12)) injection 1,000 mcg  1,000 mcg Subcutaneous Q30 days [name], MD   1,000 mcg at 12/03/16 1038  . cyanocobalamin ((VITAMIN B-12)) injection 1,000 mcg  1,000 mcg Subcutaneous Q30 days [name], MD      . romiPLOStim (NPLATE) injection 50 mcg  50 mcg Subcutaneous Weekly [name], MD   50 mcg at 05/07/16 1106  . romiPLOStim (NPLATE) injection 50 mcg  0.5  mcg/kg Subcutaneous Weekly [name], MD   50 mcg at 05/14/16 1001  . romiPLOStim (NPLATE) injection 50 mcg  50 mcg Subcutaneous Weekly [name], MD   50 mcg at 08/13/16 0931  . romiPLOStim (NPLATE) injection 50 mcg  50 mcg Subcutaneous Weekly [name], MD   50 mcg at 08/27/16 9233  . romiPLOStim (NPLATE) injection 50 mcg  50 mcg Subcutaneous Weekly [name], MD   50 mcg at 09/03/16 1027  . romiPLOStim (NPLATE) injection 50 mcg  0.5 mcg/kg Subcutaneous Weekly [name], MD   50 mcg at 09/10/16 1023  . romiPLOStim (NPLATE) injection 50 mcg  0.5 mcg/kg Subcutaneous Weekly [name], MD   50 mcg at 12/03/16 1038  . romiPLOStim (NPLATE) injection 50 mcg  50 mcg Subcutaneous Weekly [name], MD   50 mcg at 02/11/17 1039  . romiPLOStim (NPLATE) injection 50 mcg  50 mcg Subcutaneous Weekly [name], MD   50 mcg at 04/01/17 1150  . romiPLOStim (NPLATE) injection 50 mcg  0.55 mcg/kg Subcutaneous Weekly Irene Limbo, Cloria Spring, MD       Romiplostim 5 mcg/kg weekly   REVIEW OF SYSTEMS:    A 10+ POINT REVIEW OF SYSTEMS WAS OBTAINED including neurology, dermatology, psychiatry, cardiac, respiratory, lymph, extremities, GI, GU, Musculoskeletal, constitutional, breasts, reproductive, HEENT.  All pertinent positives are noted in the HPI.  All others are negative.   PHYSICAL EXAMINATION: ECOG PERFORMANCE STATUS: 1 -  Symptomatic but completely ambulatory  Vitals:   03/03/18 0951  BP: (!) 153/60  Pulse: (!) 104  Resp: 20  Temp: 97.6 F (36.4 C)  SpO2: 94%   Filed Weights   03/03/18 0951  Weight: 199 lb 3.2 oz (90.4 kg)     GENERAL:alert, in no acute distress and comfortable SKIN: no acute rashes, no significant lesions EYES: conjunctiva are pink and non-injected, sclera anicteric OROPHARYNX: MMM, no exudates, no oropharyngeal erythema or ulceration NECK: supple, no JVD LYMPH:  no palpable lymphadenopathy in the cervical, axillary or inguinal regions LUNGS: clear to auscultation b/l with normal respiratory effort HEART: regular rate & rhythm ABDOMEN:  normoactive bowel sounds , non tender, not distended. No palpable hepatosplenomegaly.  Extremity: trace pedal edema PSYCH: alert & oriented x 3 with fluent speech NEURO: no focal motor/sensory deficits   LABORATORY DATA:  . CBC Latest Ref Rng & Units 03/03/2018 02/17/2018 02/06/2018  WBC 4.0 - 10.5 K/uL 5.2 7.8 5.9  Hemoglobin 13.0 - 17.0 g/dL 12.5(L) 13.2 13.7  Hematocrit 39.0 - 52.0 % 38.4(L) 42.1 43.9  Platelets 150 - 400 K/uL 118(L) 191 124(L)    CMP Latest Ref Rng & Units 02/06/2018 01/20/2018 11/25/2017  Glucose 70 - 99 mg/dL 126(H) 130(H) 106(H)  BUN 8 - 23 mg/dL 16 28(H) 26(H)  Creatinine 0.61 - 1.24 mg/dL 1.60(H) 1.70(H) 1.49(H)  Sodium 135 - 145 mmol/L 140 144 143  Potassium 3.5 - 5.1 mmol/L 4.1 4.7 4.2  Chloride 98 - 111 mmol/L 105 113(H) 111  CO2 22 - 32 mmol/L 23 24 24   Calcium 8.9 - 10.3 mg/dL 9.1 9.1 9.3  Total Protein 6.5 - 8.1 g/dL 7.2 6.6 7.0  Total Bilirubin 0.3 - 1.2 mg/dL 0.7 0.4 0.6  Alkaline Phos 38 - 126 U/L 61 74 71  AST 15 - 41 U/L 29 18 21   ALT 0 - 44 U/L 17 12 13     ASSESSMENT & PLAN:   85 y.o. Caucasian male with multiple medical comorbidities with  #  1 Chronic ITP since 1989. Has previously been responsive to steroids and has received IVIG on one occasion. He has been maintained on 3-68mcg/kg weekly of  Nplate since mid 7494 initially with Dr. Arvin Collard at [facility] and then with Dr. Jimmie Molly at [facility] in [place].  No issues with bleeding . Ultrasound abdomen showed normal spleen size SPEP- no obvious monoclonal protein. IFE showed possibility of an restrictive bands in the IgG and Lambda lanes. Patient's platelet counts have remained remarkably stable at Romiplostim doses of  0.5 mcg/kg every 2 weeks   #2 H/o B12 deficiency  PLAN:  -Discussed pt labwork today, 03/03/18; PLT at 118k, slight anemia with HGB at 12.5, WBC normal at 5.2k -Continue NPlate at 4.9QPR/FF every 2 weeks and adjust dose to maintain PLT counts >50k. If furhter downtrend in PLT, might need to adjust dose or go back to weekly shots -Continue monthly B12 -Avoid NSAIDs -Will see the pt back in 3 months   #3  Coronary artery disease status post CABG in year 2000. Not on aspirin due to his ITP . BNP was elevated. ECHO today shows mildly reduced systolic function and LVH  -followed by Dr Minus Breeding   #4 chronic kidney disease  -Continue to management as per primary care physician  #5 Recent respiratory infection Resolved and now off oxygen.   Continue Nplate every 2 weeks with labs RTC with Dr Irene Limbo in 27months   The total time spent in the appt was 20 minutes and more than 50% was on counseling and direct patient cares.   Sullivan Lone MD MS Hematology/Oncology Physician [facility]  (Office):       [phone] (Work cell):  [phone] (Fax):           [phone]  I, Baldwin [place], am acting as a scribe for Dr. Sullivan Lone.   .I have reviewed the above documentation for accuracy and completeness, and I agree with the above. [name] MD

## 2018-03-03 ENCOUNTER — Inpatient Hospital Stay: Payer: Medicare Other

## 2018-03-03 ENCOUNTER — Inpatient Hospital Stay (HOSPITAL_BASED_OUTPATIENT_CLINIC_OR_DEPARTMENT_OTHER): Payer: Medicare Other | Admitting: Hematology

## 2018-03-03 ENCOUNTER — Inpatient Hospital Stay: Payer: Medicare Other | Attending: Hematology

## 2018-03-03 ENCOUNTER — Telehealth: Payer: Self-pay | Admitting: Hematology

## 2018-03-03 VITALS — BP 153/60 | HR 104 | Temp 97.6°F | Resp 20 | Ht 67.0 in | Wt 199.2 lb

## 2018-03-03 DIAGNOSIS — E538 Deficiency of other specified B group vitamins: Secondary | ICD-10-CM

## 2018-03-03 DIAGNOSIS — N189 Chronic kidney disease, unspecified: Secondary | ICD-10-CM | POA: Insufficient documentation

## 2018-03-03 DIAGNOSIS — D693 Immune thrombocytopenic purpura: Secondary | ICD-10-CM | POA: Diagnosis not present

## 2018-03-03 DIAGNOSIS — I2510 Atherosclerotic heart disease of native coronary artery without angina pectoris: Secondary | ICD-10-CM | POA: Diagnosis not present

## 2018-03-03 DIAGNOSIS — Z95828 Presence of other vascular implants and grafts: Secondary | ICD-10-CM

## 2018-03-03 LAB — CBC WITH DIFFERENTIAL/PLATELET
Abs Immature Granulocytes: 0.05 10*3/uL (ref 0.00–0.07)
Basophils Absolute: 0 10*3/uL (ref 0.0–0.1)
Basophils Relative: 0 %
Eosinophils Absolute: 0.1 10*3/uL (ref 0.0–0.5)
Eosinophils Relative: 1 %
HCT: 38.4 % — ABNORMAL LOW (ref 39.0–52.0)
Hemoglobin: 12.5 g/dL — ABNORMAL LOW (ref 13.0–17.0)
IMMATURE GRANULOCYTES: 1 %
Lymphocytes Relative: 20 %
Lymphs Abs: 1 10*3/uL (ref 0.7–4.0)
MCH: 31.1 pg (ref 26.0–34.0)
MCHC: 32.6 g/dL (ref 30.0–36.0)
MCV: 95.5 fL (ref 80.0–100.0)
Monocytes Absolute: 0.7 10*3/uL (ref 0.1–1.0)
Monocytes Relative: 13 %
Neutro Abs: 3.4 10*3/uL (ref 1.7–7.7)
Neutrophils Relative %: 65 %
Platelets: 118 10*3/uL — ABNORMAL LOW (ref 150–400)
RBC: 4.02 MIL/uL — AB (ref 4.22–5.81)
RDW: 13 % (ref 11.5–15.5)
WBC: 5.2 10*3/uL (ref 4.0–10.5)
nRBC: 0 % (ref 0.0–0.2)

## 2018-03-03 MED ORDER — CYANOCOBALAMIN 1000 MCG/ML IJ SOLN
1000.0000 ug | INTRAMUSCULAR | Status: DC
  Administered 2018-03-03: 1000 ug via SUBCUTANEOUS

## 2018-03-03 MED ORDER — CYANOCOBALAMIN 1000 MCG/ML IJ SOLN
INTRAMUSCULAR | Status: AC
  Filled 2018-03-03: qty 1

## 2018-03-03 MED ORDER — ROMIPLOSTIM 250 MCG ~~LOC~~ SOLR
0.5500 ug/kg | SUBCUTANEOUS | Status: DC
  Administered 2018-03-03: 50 ug via SUBCUTANEOUS
  Filled 2018-03-03: qty 0.1

## 2018-03-03 NOTE — Telephone Encounter (Signed)
Printed calendar and avs. °

## 2018-03-03 NOTE — Patient Instructions (Signed)
Thank you for choosing [facility] to provide your oncology and hematology care.  To afford each patient quality time with our providers, please arrive 30 minutes before your scheduled appointment time.  If you arrive late for your appointment, you may be asked to reschedule.  We strive to give you quality time with our providers, and arriving late affects you and other patients whose appointments are after yours.    If you are a no show for multiple scheduled visits, you may be dismissed from the clinic at the providers discretion.     Again, thank you for choosing [facility], our hope is that these requests will decrease the amount of time that you wait before being seen by our physicians.  ______________________________________________________________________   Should you have questions after your visit to the  Cancer Center, please contact our office at [phone] between the hours of 8:30 and 4:30 p.m.    Voicemails left after 4:30p.m will not be returned until the following business day.     For prescription refill requests, please have your pharmacy contact us directly.  Please also try to allow 48 hours for prescription requests.     Please contact the scheduling department for questions regarding scheduling.  For scheduling of procedures such as PET scans, CT scans, MRI, Ultrasound, etc please contact central scheduling at [phone].     Resources For Cancer Patients and Caregivers:    Oncolink.org:  A wonderful resource for patients and healthcare providers for information regarding your disease, ways to tract your treatment, what to expect, etc.      American Cancer Society:  800-227-2345  Can help patients locate various types of support and financial assistance   Cancer Care: 1-800-813-HOPE (4673) Provides financial assistance, online support groups, medication/co-pay assistance.     [place] DSS:  336-641-3447 Where to apply  for food stamps, Medicaid, and utility assistance   Medicare Rights Center: 800-333-4114 Helps people with Medicare understand their rights and benefits, navigate the Medicare system, and secure the quality healthcare they deserve   SCAT: 336-333-6589 [place] [employer]'s shared-ride transportation service for eligible riders who have a disability that prevents them from riding the fixed route bus.     For additional information on assistance programs please contact our social worker:   Abigail Elmore:  [phone]  

## 2018-03-17 ENCOUNTER — Inpatient Hospital Stay: Payer: Medicare Other

## 2018-03-17 VITALS — BP 160/67 | HR 61 | Temp 97.8°F | Resp 19

## 2018-03-17 DIAGNOSIS — D693 Immune thrombocytopenic purpura: Secondary | ICD-10-CM

## 2018-03-17 DIAGNOSIS — N189 Chronic kidney disease, unspecified: Secondary | ICD-10-CM | POA: Diagnosis not present

## 2018-03-17 DIAGNOSIS — Z95828 Presence of other vascular implants and grafts: Secondary | ICD-10-CM

## 2018-03-17 DIAGNOSIS — E538 Deficiency of other specified B group vitamins: Secondary | ICD-10-CM | POA: Diagnosis not present

## 2018-03-17 DIAGNOSIS — I2510 Atherosclerotic heart disease of native coronary artery without angina pectoris: Secondary | ICD-10-CM | POA: Diagnosis not present

## 2018-03-17 LAB — CBC WITH DIFFERENTIAL/PLATELET
Abs Immature Granulocytes: 0.02 10*3/uL (ref 0.00–0.07)
Basophils Absolute: 0 10*3/uL (ref 0.0–0.1)
Basophils Relative: 0 %
Eosinophils Absolute: 0.1 10*3/uL (ref 0.0–0.5)
Eosinophils Relative: 1 %
HCT: 39 % (ref 39.0–52.0)
Hemoglobin: 12.5 g/dL — ABNORMAL LOW (ref 13.0–17.0)
IMMATURE GRANULOCYTES: 0 %
Lymphocytes Relative: 22 %
Lymphs Abs: 1 10*3/uL (ref 0.7–4.0)
MCH: 31.5 pg (ref 26.0–34.0)
MCHC: 32.1 g/dL (ref 30.0–36.0)
MCV: 98.2 fL (ref 80.0–100.0)
MONOS PCT: 11 %
Monocytes Absolute: 0.5 10*3/uL (ref 0.1–1.0)
Neutro Abs: 2.9 10*3/uL (ref 1.7–7.7)
Neutrophils Relative %: 66 %
Platelets: 159 10*3/uL (ref 150–400)
RBC: 3.97 MIL/uL — ABNORMAL LOW (ref 4.22–5.81)
RDW: 13.2 % (ref 11.5–15.5)
WBC: 4.5 10*3/uL (ref 4.0–10.5)
nRBC: 0 % (ref 0.0–0.2)

## 2018-03-17 LAB — CMP (CANCER CENTER ONLY)
ALT: 17 U/L (ref 0–44)
AST: 21 U/L (ref 15–41)
Albumin: 3.3 g/dL — ABNORMAL LOW (ref 3.5–5.0)
Alkaline Phosphatase: 72 U/L (ref 38–126)
Anion gap: 9 (ref 5–15)
BUN: 21 mg/dL (ref 8–23)
CO2: 23 mmol/L (ref 22–32)
Calcium: 9.1 mg/dL (ref 8.9–10.3)
Chloride: 112 mmol/L — ABNORMAL HIGH (ref 98–111)
Creatinine: 1.41 mg/dL — ABNORMAL HIGH (ref 0.61–1.24)
GFR, Est AFR Am: 48 mL/min — ABNORMAL LOW (ref >60)
GFR, Estimated: 41 mL/min — ABNORMAL LOW (ref >60)
Glucose, Bld: 142 mg/dL — ABNORMAL HIGH (ref 70–99)
Potassium: 4.4 mmol/L (ref 3.5–5.1)
Sodium: 144 mmol/L (ref 135–145)
Total Bilirubin: 0.4 mg/dL (ref 0.3–1.2)
Total Protein: 6.5 g/dL (ref 6.5–8.1)

## 2018-03-17 MED ORDER — ROMIPLOSTIM 250 MCG ~~LOC~~ SOLR
50.0000 ug | SUBCUTANEOUS | Status: DC
  Administered 2018-03-17: 50 ug via SUBCUTANEOUS
  Filled 2018-03-17: qty 0.1

## 2018-03-17 NOTE — Patient Instructions (Signed)
Romiplostim injection What is this medicine? ROMIPLOSTIM (roe mi PLOE stim) helps your body make more platelets. This medicine is used to treat low platelets caused by chronic idiopathic thrombocytopenic purpura (ITP). This medicine may be used for other purposes; ask your health care provider or pharmacist if you have questions. COMMON BRAND NAME(S): Nplate What should I tell my health care provider before I take this medicine? They need to know if you have any of these conditions: -bleeding disorders -bone marrow problem, like blood cancer or myelodysplastic syndrome -history of blood clots -liver disease -surgery to remove your spleen -an unusual or allergic reaction to romiplostim, mannitol, other medicines, foods, dyes, or preservatives -pregnant or trying to get pregnant -breast-feeding How should I use this medicine? This medicine is for injection under the skin. It is given by a health care professional in a hospital or clinic setting. A special MedGuide will be given to you before your injection. Read this information carefully each time. Talk to your pediatrician regarding the use of this medicine in children. While this drug may be prescribed for children as young as 1 year for selected conditions, precautions do apply. Overdosage: If you think you have taken too much of this medicine contact a poison control center or emergency room at once. NOTE: This medicine is only for you. Do not share this medicine with others. What if I miss a dose? It is important not to miss your dose. Call your doctor or health care professional if you are unable to keep an appointment. What may interact with this medicine? Interactions are not expected. This list may not describe all possible interactions. Give your health care provider a list of all the medicines, herbs, non-prescription drugs, or dietary supplements you use. Also tell them if you smoke, drink alcohol, or use illegal drugs. Some items  may interact with your medicine. What should I watch for while using this medicine? Your condition will be monitored carefully while you are receiving this medicine. Visit your prescriber or health care professional for regular checks on your progress and for the needed blood tests. It is important to keep all appointments. What side effects may I notice from receiving this medicine? Side effects that you should report to your doctor or health care professional as soon as possible: -allergic reactions like skin rash, itching or hives, swelling of the face, lips, or tongue -signs and symptoms of bleeding such as bloody or black, tarry stools; red or dark brown urine; spitting up blood or brown material that looks like coffee grounds; red spots on the skin; unusual bruising or bleeding from the eyes, gums, or nose -signs and symptoms of a blood clot such as chest pain; shortness of breath; pain, swelling, or warmth in the leg -signs and symptoms of a stroke like changes in vision; confusion; trouble speaking or understanding; severe headaches; sudden numbness or weakness of the face, arm or leg; trouble walking; dizziness; loss of balance or coordination Side effects that usually do not require medical attention (report to your doctor or health care professional if they continue or are bothersome): -headache -pain in arms and legs -pain in mouth -stomach pain This list may not describe all possible side effects. Call your doctor for medical advice about side effects. You may report side effects to FDA at 1-800-FDA-1088. Where should I keep my medicine? This drug is given in a hospital or clinic and will not be stored at home. NOTE: This sheet is a summary. It may not   cover all possible information. If you have questions about this medicine, talk to your doctor, pharmacist, or health care provider.  2019 Elsevier/Gold Standard (2017-02-10 11:10:55)  

## 2018-03-18 DIAGNOSIS — I5022 Chronic systolic (congestive) heart failure: Secondary | ICD-10-CM | POA: Diagnosis not present

## 2018-03-18 DIAGNOSIS — I10 Essential (primary) hypertension: Secondary | ICD-10-CM | POA: Diagnosis not present

## 2018-03-18 DIAGNOSIS — E119 Type 2 diabetes mellitus without complications: Secondary | ICD-10-CM | POA: Diagnosis not present

## 2018-03-18 DIAGNOSIS — N183 Chronic kidney disease, stage 3 (moderate): Secondary | ICD-10-CM | POA: Diagnosis not present

## 2018-03-18 DIAGNOSIS — D693 Immune thrombocytopenic purpura: Secondary | ICD-10-CM | POA: Diagnosis not present

## 2018-03-31 ENCOUNTER — Inpatient Hospital Stay: Payer: Medicare Other | Attending: Hematology

## 2018-03-31 ENCOUNTER — Inpatient Hospital Stay: Payer: Medicare Other

## 2018-03-31 VITALS — BP 158/57 | HR 58 | Temp 98.0°F | Resp 18

## 2018-03-31 DIAGNOSIS — D693 Immune thrombocytopenic purpura: Secondary | ICD-10-CM | POA: Diagnosis not present

## 2018-03-31 DIAGNOSIS — Z95828 Presence of other vascular implants and grafts: Secondary | ICD-10-CM

## 2018-03-31 DIAGNOSIS — E538 Deficiency of other specified B group vitamins: Secondary | ICD-10-CM | POA: Insufficient documentation

## 2018-03-31 LAB — CBC WITH DIFFERENTIAL/PLATELET
Abs Immature Granulocytes: 0.03 10*3/uL (ref 0.00–0.07)
Basophils Absolute: 0 10*3/uL (ref 0.0–0.1)
Basophils Relative: 1 %
Eosinophils Absolute: 0.1 10*3/uL (ref 0.0–0.5)
Eosinophils Relative: 1 %
HCT: 39.8 % (ref 39.0–52.0)
Hemoglobin: 12.9 g/dL — ABNORMAL LOW (ref 13.0–17.0)
Immature Granulocytes: 1 %
LYMPHS ABS: 1 10*3/uL (ref 0.7–4.0)
Lymphocytes Relative: 18 %
MCH: 31.5 pg (ref 26.0–34.0)
MCHC: 32.4 g/dL (ref 30.0–36.0)
MCV: 97.3 fL (ref 80.0–100.0)
Monocytes Absolute: 0.6 10*3/uL (ref 0.1–1.0)
Monocytes Relative: 11 %
Neutro Abs: 3.8 10*3/uL (ref 1.7–7.7)
Neutrophils Relative %: 68 %
Platelets: 136 10*3/uL — ABNORMAL LOW (ref 150–400)
RBC: 4.09 MIL/uL — ABNORMAL LOW (ref 4.22–5.81)
RDW: 13.3 % (ref 11.5–15.5)
WBC: 5.6 10*3/uL (ref 4.0–10.5)
nRBC: 0 % (ref 0.0–0.2)

## 2018-03-31 MED ORDER — ROMIPLOSTIM 250 MCG ~~LOC~~ SOLR
50.0000 ug | SUBCUTANEOUS | Status: DC
  Administered 2018-03-31: 50 ug via SUBCUTANEOUS
  Filled 2018-03-31: qty 0.1

## 2018-03-31 MED ORDER — CYANOCOBALAMIN 1000 MCG/ML IJ SOLN
1000.0000 ug | INTRAMUSCULAR | Status: DC
  Administered 2018-03-31: 1000 ug via SUBCUTANEOUS

## 2018-03-31 MED ORDER — CYANOCOBALAMIN 1000 MCG/ML IJ SOLN
INTRAMUSCULAR | Status: AC
  Filled 2018-03-31: qty 1

## 2018-03-31 NOTE — Patient Instructions (Signed)
Cyanocobalamin, Pyridoxine, and Folate What is this medicine? A multivitamin containing folic acid, vitamin B6, and vitamin B12. This medicine may be used for other purposes; ask your health care provider or pharmacist if you have questions. COMMON BRAND NAME(S): AllanFol RX, AllanTex, Av-Vite FB, B Complex with Folic Acid, ComBgen, FaBB, Folamin, Folastin, [place], [place], [place], [place], [place], [employer], Advance RX 2.2, [place], [place] 2.2, Foltabs 800, Foltx, Homocysteine Formula, Niva-Fol, NuFol, [employer], Virt-Gard, Virt-Vite, Virt-Vite [place], Vita-Respa What should I tell my health care provider before I take this medicine? They need to know if you have any of these conditions: -bleeding or clotting disorder -history of anemia of any type -other chronic health condition -an unusual or allergic reaction to vitamins, other medicines, foods, dyes, or preservatives -pregnant or trying to get pregnant -breast-feeding How should I use this medicine? Take by mouth with a glass of water. May take with food. Follow the directions on the prescription label. It is usually given once a day. Do not take your medicine more often than directed. Contact your pediatrician regarding the use of this medicine in children. Special care may be needed. Overdosage: If you think you have taken too much of this medicine contact a poison control center or emergency room at once. NOTE: This medicine is only for you. Do not share this medicine with others. What if I miss a dose? If you miss a dose, take it as soon as you can. If it is almost time for your next dose, take only that dose. Do not take double or extra doses. What may interact with this medicine? -levodopa This list may not describe all possible interactions. Give your health care provider a list of all the medicines, herbs, non-prescription drugs, or dietary supplements you use. Also tell them if you smoke, drink alcohol, or use illegal drugs.  Some items may interact with your medicine. What should I watch for while using this medicine? See your health care professional for regular checks on your progress. Remember that vitamin supplements do not replace the need for good nutrition from a balanced diet. What side effects may I notice from receiving this medicine? Side effects that you should report to your doctor or health care professional as soon as possible: -allergic reaction such as skin rash or difficulty breathing -vomiting Side effects that usually do not require medical attention (report to your doctor or health care professional if they continue or are bothersome): -nausea -stomach upset This list may not describe all possible side effects. Call your doctor for medical advice about side effects. You may report side effects to FDA at 1-800-FDA-1088. Where should I keep my medicine? Keep out of the reach of children. Most vitamins should be stored at controlled room temperature. Check your specific product directions. Protect from heat and moisture. Throw away any unused medicine after the expiration date. NOTE: This sheet is a summary. It may not cover all possible information. If you have questions about this medicine, talk to your doctor, pharmacist, or health care provider.  2019 Elsevier/Gold Standard (2007-04-04 00:59:55) Romiplostim injection What is this medicine? ROMIPLOSTIM (roe mi PLOE stim) helps your body make more platelets. This medicine is used to treat low platelets caused by chronic idiopathic thrombocytopenic purpura (ITP). This medicine may be used for other purposes; ask your health care provider or pharmacist if you have questions. COMMON BRAND NAME(S): Nplate What should I tell my health care provider before I take this medicine? They need to know if  you have any of these conditions: -bleeding disorders -bone marrow problem, like blood cancer or myelodysplastic syndrome -history of blood  clots -liver disease -surgery to remove your spleen -an unusual or allergic reaction to romiplostim, mannitol, other medicines, foods, dyes, or preservatives -pregnant or trying to get pregnant -breast-feeding How should I use this medicine? This medicine is for injection under the skin. It is given by a health care professional in a hospital or clinic setting. A special MedGuide will be given to you before your injection. Read this information carefully each time. Talk to your pediatrician regarding the use of this medicine in children. While this drug may be prescribed for children as young as 1 year for selected conditions, precautions do apply. Overdosage: If you think you have taken too much of this medicine contact a poison control center or emergency room at once. NOTE: This medicine is only for you. Do not share this medicine with others. What if I miss a dose? It is important not to miss your dose. Call your doctor or health care professional if you are unable to keep an appointment. What may interact with this medicine? Interactions are not expected. This list may not describe all possible interactions. Give your health care provider a list of all the medicines, herbs, non-prescription drugs, or dietary supplements you use. Also tell them if you smoke, drink alcohol, or use illegal drugs. Some items may interact with your medicine. What should I watch for while using this medicine? Your condition will be monitored carefully while you are receiving this medicine. Visit your prescriber or health care professional for regular checks on your progress and for the needed blood tests. It is important to keep all appointments. What side effects may I notice from receiving this medicine? Side effects that you should report to your doctor or health care professional as soon as possible: -allergic reactions like skin rash, itching or hives, swelling of the face, lips, or tongue -signs and  symptoms of bleeding such as bloody or black, tarry stools; red or dark brown urine; spitting up blood or brown material that looks like coffee grounds; red spots on the skin; unusual bruising or bleeding from the eyes, gums, or nose -signs and symptoms of a blood clot such as chest pain; shortness of breath; pain, swelling, or warmth in the leg -signs and symptoms of a stroke like changes in vision; confusion; trouble speaking or understanding; severe headaches; sudden numbness or weakness of the face, arm or leg; trouble walking; dizziness; loss of balance or coordination Side effects that usually do not require medical attention (report to your doctor or health care professional if they continue or are bothersome): -headache -pain in arms and legs -pain in mouth -stomach pain This list may not describe all possible side effects. Call your doctor for medical advice about side effects. You may report side effects to FDA at 1-800-FDA-1088. Where should I keep my medicine? This drug is given in a hospital or clinic and will not be stored at home. NOTE: This sheet is a summary. It may not cover all possible information. If you have questions about this medicine, talk to your doctor, pharmacist, or health care provider.  2019 Elsevier/Gold Standard (2017-02-10 11:10:55)

## 2018-04-14 ENCOUNTER — Inpatient Hospital Stay: Payer: Medicare Other

## 2018-04-14 VITALS — BP 162/58 | HR 62 | Temp 98.1°F | Resp 18

## 2018-04-14 DIAGNOSIS — Z95828 Presence of other vascular implants and grafts: Secondary | ICD-10-CM

## 2018-04-14 DIAGNOSIS — D693 Immune thrombocytopenic purpura: Secondary | ICD-10-CM

## 2018-04-14 DIAGNOSIS — E538 Deficiency of other specified B group vitamins: Secondary | ICD-10-CM | POA: Diagnosis not present

## 2018-04-14 LAB — CBC WITH DIFFERENTIAL/PLATELET
Abs Immature Granulocytes: 0.04 10*3/uL (ref 0.00–0.07)
Basophils Absolute: 0 10*3/uL (ref 0.0–0.1)
Basophils Relative: 0 %
Eosinophils Absolute: 0.1 10*3/uL (ref 0.0–0.5)
Eosinophils Relative: 1 %
HCT: 40.6 % (ref 39.0–52.0)
Hemoglobin: 12.9 g/dL — ABNORMAL LOW (ref 13.0–17.0)
Immature Granulocytes: 1 %
LYMPHS PCT: 20 %
Lymphs Abs: 1 10*3/uL (ref 0.7–4.0)
MCH: 31.6 pg (ref 26.0–34.0)
MCHC: 31.8 g/dL (ref 30.0–36.0)
MCV: 99.5 fL (ref 80.0–100.0)
MONO ABS: 0.6 10*3/uL (ref 0.1–1.0)
Monocytes Relative: 12 %
Neutro Abs: 3.2 10*3/uL (ref 1.7–7.7)
Neutrophils Relative %: 66 %
Platelets: 142 10*3/uL — ABNORMAL LOW (ref 150–400)
RBC: 4.08 MIL/uL — ABNORMAL LOW (ref 4.22–5.81)
RDW: 13.1 % (ref 11.5–15.5)
WBC: 4.9 10*3/uL (ref 4.0–10.5)
nRBC: 0 % (ref 0.0–0.2)

## 2018-04-14 MED ORDER — ROMIPLOSTIM 250 MCG ~~LOC~~ SOLR
50.0000 ug | SUBCUTANEOUS | Status: DC
  Administered 2018-04-14: 50 ug via SUBCUTANEOUS
  Filled 2018-04-14: qty 0.1

## 2018-04-23 DIAGNOSIS — C44329 Squamous cell carcinoma of skin of other parts of face: Secondary | ICD-10-CM | POA: Diagnosis not present

## 2018-04-28 ENCOUNTER — Inpatient Hospital Stay: Payer: Medicare Other | Attending: Hematology

## 2018-04-28 ENCOUNTER — Inpatient Hospital Stay: Payer: Medicare Other

## 2018-04-28 DIAGNOSIS — E538 Deficiency of other specified B group vitamins: Secondary | ICD-10-CM | POA: Diagnosis not present

## 2018-04-28 DIAGNOSIS — D693 Immune thrombocytopenic purpura: Secondary | ICD-10-CM | POA: Insufficient documentation

## 2018-04-28 DIAGNOSIS — Z95828 Presence of other vascular implants and grafts: Secondary | ICD-10-CM

## 2018-04-28 LAB — CBC WITH DIFFERENTIAL/PLATELET
ABS IMMATURE GRANULOCYTES: 0.04 10*3/uL (ref 0.00–0.07)
Basophils Absolute: 0 10*3/uL (ref 0.0–0.1)
Basophils Relative: 0 %
Eosinophils Absolute: 0.1 10*3/uL (ref 0.0–0.5)
Eosinophils Relative: 1 %
HCT: 39.4 % (ref 39.0–52.0)
HEMOGLOBIN: 12.6 g/dL — AB (ref 13.0–17.0)
Immature Granulocytes: 1 %
Lymphocytes Relative: 16 %
Lymphs Abs: 1 10*3/uL (ref 0.7–4.0)
MCH: 31.5 pg (ref 26.0–34.0)
MCHC: 32 g/dL (ref 30.0–36.0)
MCV: 98.5 fL (ref 80.0–100.0)
Monocytes Absolute: 0.5 10*3/uL (ref 0.1–1.0)
Monocytes Relative: 8 %
NEUTROS ABS: 4.6 10*3/uL (ref 1.7–7.7)
NEUTROS PCT: 74 %
Platelets: 172 10*3/uL (ref 150–400)
RBC: 4 MIL/uL — ABNORMAL LOW (ref 4.22–5.81)
RDW: 13.1 % (ref 11.5–15.5)
WBC: 6.2 10*3/uL (ref 4.0–10.5)
nRBC: 0 % (ref 0.0–0.2)

## 2018-04-28 MED ORDER — CYANOCOBALAMIN 1000 MCG/ML IJ SOLN
INTRAMUSCULAR | Status: AC
  Filled 2018-04-28: qty 1

## 2018-04-28 MED ORDER — CYANOCOBALAMIN 1000 MCG/ML IJ SOLN
1000.0000 ug | INTRAMUSCULAR | Status: DC
  Administered 2018-04-28: 1000 ug via SUBCUTANEOUS

## 2018-04-28 MED ORDER — ROMIPLOSTIM 250 MCG ~~LOC~~ SOLR
50.0000 ug | Freq: Once | SUBCUTANEOUS | Status: AC
  Administered 2018-04-28: 50 ug via SUBCUTANEOUS
  Filled 2018-04-28: qty 0.1

## 2018-05-02 DIAGNOSIS — I5022 Chronic systolic (congestive) heart failure: Secondary | ICD-10-CM | POA: Diagnosis not present

## 2018-05-02 DIAGNOSIS — J302 Other seasonal allergic rhinitis: Secondary | ICD-10-CM | POA: Diagnosis not present

## 2018-05-02 DIAGNOSIS — I10 Essential (primary) hypertension: Secondary | ICD-10-CM | POA: Diagnosis not present

## 2018-05-02 DIAGNOSIS — R0600 Dyspnea, unspecified: Secondary | ICD-10-CM | POA: Diagnosis not present

## 2018-05-02 DIAGNOSIS — E119 Type 2 diabetes mellitus without complications: Secondary | ICD-10-CM | POA: Diagnosis not present

## 2018-05-02 DIAGNOSIS — I2510 Atherosclerotic heart disease of native coronary artery without angina pectoris: Secondary | ICD-10-CM | POA: Diagnosis not present

## 2018-05-12 ENCOUNTER — Inpatient Hospital Stay: Payer: Medicare Other

## 2018-05-12 ENCOUNTER — Other Ambulatory Visit: Payer: Self-pay

## 2018-05-12 DIAGNOSIS — E538 Deficiency of other specified B group vitamins: Secondary | ICD-10-CM | POA: Diagnosis not present

## 2018-05-12 DIAGNOSIS — Z95828 Presence of other vascular implants and grafts: Secondary | ICD-10-CM

## 2018-05-12 DIAGNOSIS — D693 Immune thrombocytopenic purpura: Secondary | ICD-10-CM

## 2018-05-12 LAB — CBC WITH DIFFERENTIAL/PLATELET
Abs Immature Granulocytes: 0.02 10*3/uL (ref 0.00–0.07)
Basophils Absolute: 0 10*3/uL (ref 0.0–0.1)
Basophils Relative: 0 %
EOS PCT: 1 %
Eosinophils Absolute: 0.1 10*3/uL (ref 0.0–0.5)
HCT: 40.8 % (ref 39.0–52.0)
HEMOGLOBIN: 12.7 g/dL — AB (ref 13.0–17.0)
Immature Granulocytes: 0 %
LYMPHS PCT: 15 %
Lymphs Abs: 1 10*3/uL (ref 0.7–4.0)
MCH: 31.7 pg (ref 26.0–34.0)
MCHC: 31.1 g/dL (ref 30.0–36.0)
MCV: 101.7 fL — ABNORMAL HIGH (ref 80.0–100.0)
Monocytes Absolute: 0.5 10*3/uL (ref 0.1–1.0)
Monocytes Relative: 8 %
Neutro Abs: 5.1 10*3/uL (ref 1.7–7.7)
Neutrophils Relative %: 76 %
Platelets: 167 10*3/uL (ref 150–400)
RBC: 4.01 MIL/uL — ABNORMAL LOW (ref 4.22–5.81)
RDW: 13 % (ref 11.5–15.5)
WBC: 6.6 10*3/uL (ref 4.0–10.5)
nRBC: 0 % (ref 0.0–0.2)

## 2018-05-12 LAB — CMP (CANCER CENTER ONLY)
ALT: 10 U/L (ref 0–44)
AST: 16 U/L (ref 15–41)
Albumin: 3.1 g/dL — ABNORMAL LOW (ref 3.5–5.0)
Alkaline Phosphatase: 66 U/L (ref 38–126)
Anion gap: 10 (ref 5–15)
BUN: 22 mg/dL (ref 8–23)
CO2: 23 mmol/L (ref 22–32)
Calcium: 9.2 mg/dL (ref 8.9–10.3)
Chloride: 110 mmol/L (ref 98–111)
Creatinine: 1.55 mg/dL — ABNORMAL HIGH (ref 0.61–1.24)
GFR, Est AFR Am: 43 mL/min — ABNORMAL LOW (ref >60)
GFR, Estimated: 37 mL/min — ABNORMAL LOW (ref >60)
Glucose, Bld: 148 mg/dL — ABNORMAL HIGH (ref 70–99)
Potassium: 4.7 mmol/L (ref 3.5–5.1)
Sodium: 143 mmol/L (ref 135–145)
Total Bilirubin: 0.6 mg/dL (ref 0.3–1.2)
Total Protein: 7 g/dL (ref 6.5–8.1)

## 2018-05-12 MED ORDER — ROMIPLOSTIM 250 MCG ~~LOC~~ SOLR
50.0000 ug | SUBCUTANEOUS | Status: DC
  Administered 2018-05-12: 50 ug via SUBCUTANEOUS
  Filled 2018-05-12: qty 0.1

## 2018-05-12 NOTE — Patient Instructions (Signed)
Romiplostim injection What is this medicine? ROMIPLOSTIM (roe mi PLOE stim) helps your body make more platelets. This medicine is used to treat low platelets caused by chronic idiopathic thrombocytopenic purpura (ITP). This medicine may be used for other purposes; ask your health care provider or pharmacist if you have questions. COMMON BRAND NAME(S): Nplate What should I tell my health care provider before I take this medicine? They need to know if you have any of these conditions: -bleeding disorders -bone marrow problem, like blood cancer or myelodysplastic syndrome -history of blood clots -liver disease -surgery to remove your spleen -an unusual or allergic reaction to romiplostim, mannitol, other medicines, foods, dyes, or preservatives -pregnant or trying to get pregnant -breast-feeding How should I use this medicine? This medicine is for injection under the skin. It is given by a health care professional in a hospital or clinic setting. A special MedGuide will be given to you before your injection. Read this information carefully each time. Talk to your pediatrician regarding the use of this medicine in children. While this drug may be prescribed for children as young as 1 year for selected conditions, precautions do apply. Overdosage: If you think you have taken too much of this medicine contact a poison control center or emergency room at once. NOTE: This medicine is only for you. Do not share this medicine with others. What if I miss a dose? It is important not to miss your dose. Call your doctor or health care professional if you are unable to keep an appointment. What may interact with this medicine? Interactions are not expected. This list may not describe all possible interactions. Give your health care provider a list of all the medicines, herbs, non-prescription drugs, or dietary supplements you use. Also tell them if you smoke, drink alcohol, or use illegal drugs. Some items  may interact with your medicine. What should I watch for while using this medicine? Your condition will be monitored carefully while you are receiving this medicine. Visit your prescriber or health care professional for regular checks on your progress and for the needed blood tests. It is important to keep all appointments. What side effects may I notice from receiving this medicine? Side effects that you should report to your doctor or health care professional as soon as possible: -allergic reactions like skin rash, itching or hives, swelling of the face, lips, or tongue -signs and symptoms of bleeding such as bloody or black, tarry stools; red or dark brown urine; spitting up blood or brown material that looks like coffee grounds; red spots on the skin; unusual bruising or bleeding from the eyes, gums, or nose -signs and symptoms of a blood clot such as chest pain; shortness of breath; pain, swelling, or warmth in the leg -signs and symptoms of a stroke like changes in vision; confusion; trouble speaking or understanding; severe headaches; sudden numbness or weakness of the face, arm or leg; trouble walking; dizziness; loss of balance or coordination Side effects that usually do not require medical attention (report to your doctor or health care professional if they continue or are bothersome): -headache -pain in arms and legs -pain in mouth -stomach pain This list may not describe all possible side effects. Call your doctor for medical advice about side effects. You may report side effects to FDA at 1-800-FDA-1088. Where should I keep my medicine? This drug is given in a hospital or clinic and will not be stored at home. NOTE: This sheet is a summary. It may not   cover all possible information. If you have questions about this medicine, talk to your doctor, pharmacist, or health care provider.  2019 Elsevier/Gold Standard (2017-02-10 11:10:55)  

## 2018-05-26 ENCOUNTER — Inpatient Hospital Stay: Payer: Medicare Other

## 2018-05-26 ENCOUNTER — Other Ambulatory Visit: Payer: Self-pay

## 2018-05-26 VITALS — BP 155/67 | HR 60 | Temp 98.1°F | Resp 18

## 2018-05-26 DIAGNOSIS — D693 Immune thrombocytopenic purpura: Secondary | ICD-10-CM

## 2018-05-26 DIAGNOSIS — Z95828 Presence of other vascular implants and grafts: Secondary | ICD-10-CM

## 2018-05-26 DIAGNOSIS — E538 Deficiency of other specified B group vitamins: Secondary | ICD-10-CM | POA: Diagnosis not present

## 2018-05-26 LAB — CBC WITH DIFFERENTIAL/PLATELET
Abs Immature Granulocytes: 0.02 10*3/uL (ref 0.00–0.07)
Basophils Absolute: 0 10*3/uL (ref 0.0–0.1)
Basophils Relative: 0 %
Eosinophils Absolute: 0 10*3/uL (ref 0.0–0.5)
Eosinophils Relative: 1 %
HCT: 44.7 % (ref 39.0–52.0)
Hemoglobin: 14.1 g/dL (ref 13.0–17.0)
Immature Granulocytes: 0 %
Lymphocytes Relative: 17 %
Lymphs Abs: 1 10*3/uL (ref 0.7–4.0)
MCH: 31.3 pg (ref 26.0–34.0)
MCHC: 31.5 g/dL (ref 30.0–36.0)
MCV: 99.1 fL (ref 80.0–100.0)
MONO ABS: 0.6 10*3/uL (ref 0.1–1.0)
Monocytes Relative: 10 %
Neutro Abs: 4.5 10*3/uL (ref 1.7–7.7)
Neutrophils Relative %: 72 %
Platelets: 158 10*3/uL (ref 150–400)
RBC: 4.51 MIL/uL (ref 4.22–5.81)
RDW: 12.9 % (ref 11.5–15.5)
WBC: 6.2 10*3/uL (ref 4.0–10.5)
nRBC: 0 % (ref 0.0–0.2)

## 2018-05-26 MED ORDER — ROMIPLOSTIM 250 MCG ~~LOC~~ SOLR
50.0000 ug | Freq: Once | SUBCUTANEOUS | Status: AC
  Administered 2018-05-26: 50 ug via SUBCUTANEOUS
  Filled 2018-05-26: qty 0.1

## 2018-05-26 MED ORDER — CYANOCOBALAMIN 1000 MCG/ML IJ SOLN
1000.0000 ug | Freq: Once | INTRAMUSCULAR | Status: AC
  Administered 2018-05-26: 1000 ug via SUBCUTANEOUS

## 2018-05-26 MED ORDER — CYANOCOBALAMIN 1000 MCG/ML IJ SOLN
INTRAMUSCULAR | Status: AC
  Filled 2018-05-26: qty 1

## 2018-06-01 ENCOUNTER — Telehealth: Payer: Self-pay | Admitting: *Deleted

## 2018-06-01 NOTE — Telephone Encounter (Signed)
Per Dr. Irene Limbo, cancel MD appt on 4/7 and add MD appt to appts already scheduled ion 4/14 - lab and injection. Patient is in agreement. Patient requests appts remain on Tuesday, between 9 and 12 (due to transportation limitations) Appt for 4/7 cancelled, and schedule message sent to add MD appt to 4/14. Patient verbalized understanding and thanked caller.

## 2018-06-02 ENCOUNTER — Inpatient Hospital Stay: Payer: Medicare Other | Admitting: Hematology

## 2018-06-08 NOTE — Progress Notes (Signed)
.    Hematology Oncology Clinic note:   Date of service: 06/09/18   Patient Care Team: Clovia Cuff, MD as PCP - General (Internal Medicine) Minus Breeding, MD as PCP - Cardiology (Cardiology) Rico Sheehan (Hematology and Oncology)  CHIEF COMPLAINTS/PURPOSE OF CONSULTATION: Follow-up for ITP   Diagnosis: Idiopathic thrombocytopenic purpura   Current treatment: Nplate q2 weekly to maintain reasonable platelet counts >50K.  Requirement have ranged from 64mcg/kg to 32mcg/kg. Recently stable counts with 0.5-1 mcg/kg qweekly  Previous treatment: Steroids, IVIG.  HISTORY OF PRESENTING ILLNESS:  please see my previous clinic note from 09/28/2014 for details of initial presentation and course of treatment.  Interval History:   Lawrence Warner presents to the clinic today for f/u of his ITP. The patient's last visit with [place] was on 03/03/18. The pt reports that he is doing well overall.   The pt reports that he has not developed any new concerns. He notes that he talks with his family every day on the phone but has avoided contact with individuals. He notes that he has not had any problems with bleeding. The pt notes that his O2 sats have remained high, but feels that he is a little more SOB. He has been on oxygen in the past, but was taken off. The pt notes that he is not on lasix anymore either. He believes he stopped using this because he was urinating frequently at night. The pt notes that his SOB has "built up over a time." He hasn't seen his PCP or Cardiologist recently. He denies a sudden change to his breathing.   The pt notes that he thinks he is "eating too well." He notes that he has lost a couple pounds in the last 3 months.  Lab results today (06/09/18) of CBC w/diff is as follows: all values are WNL except for RBC at 4.21, PLT at 137k.  On review of systems, pt reports stable energy levels, some SOB, eating well, mild weight loss, and denies concerns for bleeding, and any  other symptoms.   MEDICAL HISTORY:  Past Medical History:  Diagnosis Date   BPH (benign prostatic hypertrophy)    Chronic heart failure (HCC)    Chronic renal insufficiency    Coronary artery disease    CABG in 2000   Diabetes type 2, controlled ([place])    Diet-controlled   Diabetic neuropathy (HCC)    Dyslipidemia    Hypertension    ITP (idiopathic thrombocytopenic purpura)    Osteoarthritis    Pneumonia     SURGICAL HISTORY: Past Surgical History:  Procedure Laterality Date   CHOLECYSTECTOMY     CORONARY ARTERY BYPASS GRAFT  2000   Right hip replacement  2014   TONSILLECTOMY      SOCIAL HISTORY: Social History   Socioeconomic History   Marital status: Single    Spouse name: Not on file   Number of children: Not on file   Years of education: Not on file   Highest education level: Not on file  Occupational History   Occupation: Field seismologist strain: Not on file   Food insecurity:    Worry: Not on file    Inability: Not on file   Transportation needs:    Medical: Not on file    Non-medical: Not on file  Tobacco Use   Smoking status: Former Smoker    Packs/day: 2.00    Years: 25.00    Pack years: 50.00   Smokeless tobacco: Never  Used  Substance and Sexual Activity   Alcohol use: No   Drug use: No   Sexual activity: Not Currently  Lifestyle   Physical activity:    Days per week: Not on file    Minutes per session: Not on file   Stress: Not on file  Relationships   Social connections:    Talks on phone: Not on file    Gets together: Not on file    Attends religious service: Not on file    Active member of club or organization: Not on file    Attends meetings of clubs or organizations: Not on file    Relationship status: Not on file   Intimate partner violence:    Fear of current or ex partner: Not on file    Emotionally abused: Not on file    Physically abused: Not on file    Forced  sexual activity: Not on file  Other Topics Concern   Not on file  Social History Narrative    Two children.     He is living in [place] which is an assisted living facility. One of his son is also moving to [facility] and has a Designer, jewellery in religious studies. Patient notes that his other son is a Systems analyst with multiple awards.  FAMILY HISTORY: Family History  Problem Relation Age of Onset   Lung disease Father    Cancer Brother     ALLERGIES:  is allergic to alfuzosin hcl er; imdur [isosorbide dinitrate]; penicillins; and tape.  MEDICATIONS:  Current Outpatient Medications  Medication Sig Dispense Refill   acetaminophen (TYLENOL) 500 MG tablet Take 1,500 mg by mouth daily.      brimonidine (ALPHAGAN) 0.2 % ophthalmic solution Place 1 drop into both eyes 3 (three) times daily.     cycloSPORINE (RESTASIS) 0.05 % ophthalmic emulsion Place 1 drop into both eyes every 12 (twelve) hours.      Dextran 70-Hypromellose (ARTIFICIAL TEARS PF OP) Place 1 drop into both eyes 4 (four) times daily.      doxycycline (VIBRAMYCIN) 100 MG capsule Take 1 capsule (100 mg total) by mouth 2 (two) times daily. 20 capsule 0   hydroxypropyl methylcellulose / hypromellose (ISOPTO TEARS / GONIOVISC) 2.5 % ophthalmic solution Place 1 drop into both eyes 3 (three) times daily as needed for dry eyes.     losartan (COZAAR) 100 MG tablet Take 100 mg by mouth daily.     magnesium oxide (MAG-OX) 400 MG tablet Take 400 mg daily by mouth.     Multiple Vitamin (MULTIVITAMIN WITH MINERALS) TABS tablet Take 1 tablet by mouth daily.     omeprazole (PRILOSEC) 20 MG capsule Take 1 capsule (20 mg total) by mouth daily before breakfast.     simvastatin (ZOCOR) 20 MG tablet Take 20 mg by mouth daily.     No current facility-administered medications for this visit.    Facility-Administered Medications Ordered in Other Visits  Medication Dose Route Frequency Provider Last Rate Last Dose    cyanocobalamin ((VITAMIN B-12)) injection 1,000 mcg  1,000 mcg Subcutaneous Q30 days [name], MD   1,000 mcg at 03/13/16 1348   cyanocobalamin ((VITAMIN B-12)) injection 1,000 mcg  1,000 mcg Subcutaneous Q30 days [name], MD   1,000 mcg at 05/14/16 1001   cyanocobalamin ((VITAMIN B-12)) injection 1,000 mcg  1,000 mcg Subcutaneous Q30 days [name], MD   1,000 mcg at 09/10/16 1022   cyanocobalamin ((VITAMIN B-12)) injection 1,000 mcg  1,000 mcg Subcutaneous Q30 days [name], MD   1,000 mcg at 12/03/16 1038   romiPLOStim (NPLATE) injection 50 mcg  50 mcg Subcutaneous Weekly [name], MD   50 mcg at 08/13/16 0931   romiPLOStim (NPLATE) injection 50 mcg  50 mcg Subcutaneous Weekly [name], MD   50 mcg at 08/27/16 7939   romiPLOStim (NPLATE) injection 50 mcg  50 mcg Subcutaneous Weekly [name], MD   50 mcg at 09/03/16 1027   romiPLOStim (NPLATE) injection 50 mcg  0.5 mcg/kg Subcutaneous Weekly [name], MD   50 mcg at 09/10/16 1023   romiPLOStim (NPLATE) injection 50 mcg  0.5 mcg/kg Subcutaneous Weekly [name], MD   50 mcg at 12/03/16 1038   romiPLOStim (NPLATE) injection 50 mcg  50 mcg Subcutaneous Weekly [name], MD   50 mcg at 02/11/17 1039   romiPLOStim (NPLATE) injection 50 mcg  50 mcg Subcutaneous Weekly [name], MD   50 mcg at 04/01/17 1150   romiPLOStim (NPLATE) injection 50 mcg  50 mcg Subcutaneous Once [name], MD       Romiplostim 5 mcg/kg weekly   REVIEW OF SYSTEMS:    A 10+ POINT REVIEW OF SYSTEMS WAS OBTAINED including neurology, dermatology, psychiatry, cardiac, respiratory, lymph, extremities, GI, GU, Musculoskeletal, constitutional, breasts, reproductive, HEENT.  All pertinent positives are noted in the HPI.  All others are negative.   PHYSICAL EXAMINATION: ECOG PERFORMANCE STATUS: 1 - Symptomatic but completely  ambulatory  Vitals:   06/09/18 1027  BP: (!) 157/46  Pulse: 74  Resp: 20  Temp: 97.8 F (36.6 C)  SpO2: 94%   Filed Weights   06/09/18 1027  Weight: 194 lb 14.4 oz (88.4 kg)     GENERAL:alert, in no acute distress and comfortable SKIN: no acute rashes, no significant lesions EYES: conjunctiva are pink and non-injected, sclera anicteric OROPHARYNX: MMM, no exudates, no oropharyngeal erythema or ulceration NECK: supple, no JVD LYMPH:  no palpable lymphadenopathy in the cervical, axillary or inguinal regions LUNGS: clear to auscultation b/l with normal respiratory effort HEART: regular rate & rhythm ABDOMEN:  normoactive bowel sounds , non tender, not distended. No palpable hepatosplenomegaly.  Extremity: trace pedal edema PSYCH: alert & oriented x 3 with fluent speech NEURO: no focal motor/sensory deficits   LABORATORY DATA:  . CBC Latest Ref Rng & Units 06/09/2018 05/26/2018 05/12/2018  WBC 4.0 - 10.5 K/uL 6.2 6.2 6.6  Hemoglobin 13.0 - 17.0 g/dL 13.2 14.1 12.7(L)  Hematocrit 39.0 - 52.0 % 41.3 44.7 40.8  Platelets 150 - 400 K/uL 137(L) 158 167    CMP Latest Ref Rng & Units 05/12/2018 03/17/2018 02/06/2018  Glucose 70 - 99 mg/dL 148(H) 142(H) 126(H)  BUN 8 - 23 mg/dL 22 21 16   Creatinine 0.61 - 1.24 mg/dL 1.55(H) 1.41(H) 1.60(H)  Sodium 135 - 145 mmol/L 143 144 140  Potassium 3.5 - 5.1 mmol/L 4.7 4.4 4.1  Chloride 98 - 111 mmol/L 110 112(H) 105  CO2 22 - 32 mmol/L 23 23 23   Calcium 8.9 - 10.3 mg/dL 9.2 9.1 9.1  Total Protein 6.5 - 8.1 g/dL 7.0 6.5 7.2  Total Bilirubin 0.3 - 1.2 mg/dL 0.6 0.4 0.7  Alkaline Phos 38 - 126 U/L 66 72 61  AST 15 - 41 U/L 16 21 29   ALT 0 - 44 U/L 10 17 17     ASSESSMENT & PLAN:   85 y.o. Caucasian male with multiple medical comorbidities with  #  1 Chronic ITP since 1989. Has previously been responsive to steroids and has received IVIG on one occasion. He has been maintained on 3-73mcg/kg weekly of Nplate since mid 8250 initially with Dr.  Arvin Collard at [facility] and then with Dr. Jimmie Molly at [facility] in [place].  No issues with bleeding . Ultrasound abdomen showed normal spleen size SPEP- no obvious monoclonal protein. IFE showed possibility of an restrictive bands in the IgG and Lambda lanes. Patient's platelet counts have remained remarkably stable at Romiplostim doses of  0.5 mcg/kg every 2 weeks   #2 H/o B12 deficiency  PLAN:  -Discussed pt labwork today, 06/09/18; PLT at 137k. Normal WBC and HGB. -Will continue 0.81mcg/kg NPlate every 2 weeks and adjust dose to maintain PLT counts >50k. If further downtrend in PLT, might need to adjust dose or go back to weekly shot -Recommend pt discusses Lasix indications and fluid optimization with his PCP and/or cardiologist -Continue monthly B12 -Avoid NSAIDs -Will see the pt back in 4 months  #3  Coronary artery disease status post CABG in year 2000. Not on aspirin due to his ITP . BNP was elevated. Previous ECHO showed mildly reduced systolic function and LVH  -followed by Dr Minus Breeding   #4 chronic kidney disease  -Continue to management as per primary care physician  #5 Previous respiratory infection Resolved and now off oxygen.   Continue Nplate every 2 weeks with labs - plz schedule for next 6 months RTC with Dr Irene Limbo in 4 months   The total time spent in the appt was15 minutes and more than 50% was on counseling and direct patient cares.   Sullivan Lone MD MS Hematology/Oncology Physician [facility]  (Office):       [phone] (Work cell):  [phone] (Fax):           [phone]  I, Baldwin Jamaica, am acting as a scribe for Dr. Sullivan Lone.   .I have reviewed the above documentation for accuracy and completeness, and I agree with the above. [name] MD

## 2018-06-09 ENCOUNTER — Inpatient Hospital Stay: Payer: Medicare Other

## 2018-06-09 ENCOUNTER — Inpatient Hospital Stay (HOSPITAL_BASED_OUTPATIENT_CLINIC_OR_DEPARTMENT_OTHER): Payer: Medicare Other | Admitting: Hematology

## 2018-06-09 ENCOUNTER — Other Ambulatory Visit: Payer: Self-pay

## 2018-06-09 ENCOUNTER — Other Ambulatory Visit: Payer: Medicare Other

## 2018-06-09 ENCOUNTER — Telehealth: Payer: Self-pay | Admitting: Hematology

## 2018-06-09 ENCOUNTER — Inpatient Hospital Stay: Payer: Medicare Other | Attending: Hematology

## 2018-06-09 ENCOUNTER — Ambulatory Visit: Payer: Medicare Other

## 2018-06-09 VITALS — BP 157/46 | HR 74 | Temp 97.8°F | Resp 20 | Ht 67.0 in | Wt 194.9 lb

## 2018-06-09 DIAGNOSIS — I2510 Atherosclerotic heart disease of native coronary artery without angina pectoris: Secondary | ICD-10-CM | POA: Insufficient documentation

## 2018-06-09 DIAGNOSIS — D693 Immune thrombocytopenic purpura: Secondary | ICD-10-CM

## 2018-06-09 DIAGNOSIS — E538 Deficiency of other specified B group vitamins: Secondary | ICD-10-CM | POA: Insufficient documentation

## 2018-06-09 DIAGNOSIS — R0602 Shortness of breath: Secondary | ICD-10-CM

## 2018-06-09 DIAGNOSIS — I509 Heart failure, unspecified: Secondary | ICD-10-CM

## 2018-06-09 DIAGNOSIS — N189 Chronic kidney disease, unspecified: Secondary | ICD-10-CM | POA: Diagnosis not present

## 2018-06-09 DIAGNOSIS — I130 Hypertensive heart and chronic kidney disease with heart failure and stage 1 through stage 4 chronic kidney disease, or unspecified chronic kidney disease: Secondary | ICD-10-CM

## 2018-06-09 DIAGNOSIS — Z95828 Presence of other vascular implants and grafts: Secondary | ICD-10-CM

## 2018-06-09 LAB — CBC WITH DIFFERENTIAL/PLATELET
Abs Immature Granulocytes: 0.02 10*3/uL (ref 0.00–0.07)
Basophils Absolute: 0 10*3/uL (ref 0.0–0.1)
Basophils Relative: 0 %
Eosinophils Absolute: 0.1 10*3/uL (ref 0.0–0.5)
Eosinophils Relative: 1 %
HCT: 41.3 % (ref 39.0–52.0)
Hemoglobin: 13.2 g/dL (ref 13.0–17.0)
Immature Granulocytes: 0 %
Lymphocytes Relative: 19 %
Lymphs Abs: 1.2 10*3/uL (ref 0.7–4.0)
MCH: 31.4 pg (ref 26.0–34.0)
MCHC: 32 g/dL (ref 30.0–36.0)
MCV: 98.1 fL (ref 80.0–100.0)
Monocytes Absolute: 0.6 10*3/uL (ref 0.1–1.0)
Monocytes Relative: 10 %
Neutro Abs: 4.3 10*3/uL (ref 1.7–7.7)
Neutrophils Relative %: 70 %
Platelets: 137 10*3/uL — ABNORMAL LOW (ref 150–400)
RBC: 4.21 MIL/uL — ABNORMAL LOW (ref 4.22–5.81)
RDW: 12.5 % (ref 11.5–15.5)
WBC: 6.2 10*3/uL (ref 4.0–10.5)
nRBC: 0 % (ref 0.0–0.2)

## 2018-06-09 MED ORDER — ROMIPLOSTIM 250 MCG ~~LOC~~ SOLR
50.0000 ug | Freq: Once | SUBCUTANEOUS | Status: DC
  Filled 2018-06-09: qty 0.1

## 2018-06-09 NOTE — Telephone Encounter (Signed)
Scheduled appt per 4/14 los. °

## 2018-06-09 NOTE — Progress Notes (Signed)
Pt left facility and did not have injection administered. Called pt, pt stated "I forgot, I went to see the dr and left right after, I got turned around." Message was sent to in basket to reschedule injection appt.

## 2018-06-11 DIAGNOSIS — J449 Chronic obstructive pulmonary disease, unspecified: Secondary | ICD-10-CM | POA: Diagnosis not present

## 2018-06-11 DIAGNOSIS — I5022 Chronic systolic (congestive) heart failure: Secondary | ICD-10-CM | POA: Diagnosis not present

## 2018-06-11 DIAGNOSIS — E119 Type 2 diabetes mellitus without complications: Secondary | ICD-10-CM | POA: Diagnosis not present

## 2018-06-11 DIAGNOSIS — M6281 Muscle weakness (generalized): Secondary | ICD-10-CM | POA: Diagnosis not present

## 2018-06-23 ENCOUNTER — Inpatient Hospital Stay: Payer: Medicare Other

## 2018-06-23 ENCOUNTER — Other Ambulatory Visit: Payer: Self-pay

## 2018-06-23 VITALS — BP 167/80 | HR 51 | Temp 97.8°F | Resp 20

## 2018-06-23 DIAGNOSIS — I509 Heart failure, unspecified: Secondary | ICD-10-CM | POA: Diagnosis not present

## 2018-06-23 DIAGNOSIS — N189 Chronic kidney disease, unspecified: Secondary | ICD-10-CM | POA: Diagnosis not present

## 2018-06-23 DIAGNOSIS — E538 Deficiency of other specified B group vitamins: Secondary | ICD-10-CM | POA: Diagnosis not present

## 2018-06-23 DIAGNOSIS — R0602 Shortness of breath: Secondary | ICD-10-CM | POA: Diagnosis not present

## 2018-06-23 DIAGNOSIS — I2510 Atherosclerotic heart disease of native coronary artery without angina pectoris: Secondary | ICD-10-CM | POA: Diagnosis not present

## 2018-06-23 DIAGNOSIS — Z95828 Presence of other vascular implants and grafts: Secondary | ICD-10-CM

## 2018-06-23 DIAGNOSIS — D693 Immune thrombocytopenic purpura: Secondary | ICD-10-CM | POA: Diagnosis not present

## 2018-06-23 LAB — CBC WITH DIFFERENTIAL/PLATELET
Abs Immature Granulocytes: 0.02 10*3/uL (ref 0.00–0.07)
Basophils Absolute: 0 10*3/uL (ref 0.0–0.1)
Basophils Relative: 0 %
Eosinophils Absolute: 0.1 10*3/uL (ref 0.0–0.5)
Eosinophils Relative: 1 %
HCT: 41.1 % (ref 39.0–52.0)
Hemoglobin: 13.1 g/dL (ref 13.0–17.0)
Immature Granulocytes: 0 %
Lymphocytes Relative: 23 %
Lymphs Abs: 1.1 10*3/uL (ref 0.7–4.0)
MCH: 31.3 pg (ref 26.0–34.0)
MCHC: 31.9 g/dL (ref 30.0–36.0)
MCV: 98.1 fL (ref 80.0–100.0)
Monocytes Absolute: 0.4 10*3/uL (ref 0.1–1.0)
Monocytes Relative: 9 %
Neutro Abs: 3.2 10*3/uL (ref 1.7–7.7)
Neutrophils Relative %: 67 %
Platelets: 102 10*3/uL — ABNORMAL LOW (ref 150–400)
RBC: 4.19 MIL/uL — ABNORMAL LOW (ref 4.22–5.81)
RDW: 12.3 % (ref 11.5–15.5)
WBC: 4.8 10*3/uL (ref 4.0–10.5)
nRBC: 0 % (ref 0.0–0.2)

## 2018-06-23 MED ORDER — CYANOCOBALAMIN 1000 MCG/ML IJ SOLN
1000.0000 ug | Freq: Once | INTRAMUSCULAR | Status: AC
  Administered 2018-06-23: 1000 ug via SUBCUTANEOUS

## 2018-06-23 MED ORDER — CYANOCOBALAMIN 1000 MCG/ML IJ SOLN
INTRAMUSCULAR | Status: AC
  Filled 2018-06-23: qty 1

## 2018-06-23 MED ORDER — ROMIPLOSTIM 250 MCG ~~LOC~~ SOLR
50.0000 ug | Freq: Once | SUBCUTANEOUS | Status: AC
  Administered 2018-06-23: 50 ug via SUBCUTANEOUS
  Filled 2018-06-23: qty 0.1

## 2018-06-23 NOTE — Patient Instructions (Addendum)
Cyanocobalamin, Pyridoxine, and Folate What is this medicine? A multivitamin containing folic acid, vitamin B6, and vitamin B12. This medicine may be used for other purposes; ask your health care provider or pharmacist if you have questions. COMMON BRAND NAME(S): AllanFol RX, AllanTex, Av-Vite FB, B Complex with Folic Acid, ComBgen, FaBB, Folamin, Folastin, [place], [place], [place], [place], [place], [employer], [place] RX 2.2, [place], [place] 2.2, Foltabs 800, Foltx, Homocysteine Formula, Niva-Fol, NuFol, TL [employer], Virt-Gard, Virt-Vite, Virt-Vite [place], Vita-Respa What should I tell my health care provider before I take this medicine? They need to know if you have any of these conditions: -bleeding or clotting disorder -history of anemia of any type -other chronic health condition -an unusual or allergic reaction to vitamins, other medicines, foods, dyes, or preservatives -pregnant or trying to get pregnant -breast-feeding How should I use this medicine? Take by mouth with a glass of water. May take with food. Follow the directions on the prescription label. It is usually given once a day. Do not take your medicine more often than directed. Contact your pediatrician regarding the use of this medicine in children. Special care may be needed. Overdosage: If you think you have taken too much of this medicine contact a poison control center or emergency room at once. NOTE: This medicine is only for you. Do not share this medicine with others. What if I miss a dose? If you miss a dose, take it as soon as you can. If it is almost time for your next dose, take only that dose. Do not take double or extra doses. What may interact with this medicine? -levodopa This list may not describe all possible interactions. Give your health care provider a list of all the medicines, herbs, non-prescription drugs, or dietary supplements you use. Also tell them if you smoke, drink alcohol, or use illegal drugs.  Some items may interact with your medicine. What should I watch for while using this medicine? See your health care professional for regular checks on your progress. Remember that vitamin supplements do not replace the need for good nutrition from a balanced diet. What side effects may I notice from receiving this medicine? Side effects that you should report to your doctor or health care professional as soon as possible: -allergic reaction such as skin rash or difficulty breathing -vomiting Side effects that usually do not require medical attention (report to your doctor or health care professional if they continue or are bothersome): -nausea -stomach upset This list may not describe all possible side effects. Call your doctor for medical advice about side effects. You may report side effects to FDA at 1-800-FDA-1088. Where should I keep my medicine? Keep out of the reach of children. Most vitamins should be stored at controlled room temperature. Check your specific product directions. Protect from heat and moisture. Throw away any unused medicine after the expiration date. NOTE: This sheet is a summary. It may not cover all possible information. If you have questions about this medicine, talk to your doctor, pharmacist, or health care provider.  2019 Elsevier/Gold Standard (2007-04-04 00:59:55) Romiplostim injection What is this medicine? ROMIPLOSTIM (roe mi PLOE stim) helps your body make more platelets. This medicine is used to treat low platelets caused by chronic idiopathic thrombocytopenic purpura (ITP). This medicine may be used for other purposes; ask your health care provider or pharmacist if you have questions. COMMON BRAND NAME(S): Nplate What should I tell my health care provider before I take this medicine? They need to know if  you have any of these conditions: -bleeding disorders -bone marrow problem, like blood cancer or myelodysplastic syndrome -history of blood  clots -liver disease -surgery to remove your spleen -an unusual or allergic reaction to romiplostim, mannitol, other medicines, foods, dyes, or preservatives -pregnant or trying to get pregnant -breast-feeding How should I use this medicine? This medicine is for injection under the skin. It is given by a health care professional in a hospital or clinic setting. A special MedGuide will be given to you before your injection. Read this information carefully each time. Talk to your pediatrician regarding the use of this medicine in children. While this drug may be prescribed for children as young as 1 year for selected conditions, precautions do apply. Overdosage: If you think you have taken too much of this medicine contact a poison control center or emergency room at once. NOTE: This medicine is only for you. Do not share this medicine with others. What if I miss a dose? It is important not to miss your dose. Call your doctor or health care professional if you are unable to keep an appointment. What may interact with this medicine? Interactions are not expected. This list may not describe all possible interactions. Give your health care provider a list of all the medicines, herbs, non-prescription drugs, or dietary supplements you use. Also tell them if you smoke, drink alcohol, or use illegal drugs. Some items may interact with your medicine. What should I watch for while using this medicine? Your condition will be monitored carefully while you are receiving this medicine. Visit your prescriber or health care professional for regular checks on your progress and for the needed blood tests. It is important to keep all appointments. What side effects may I notice from receiving this medicine? Side effects that you should report to your doctor or health care professional as soon as possible: -allergic reactions like skin rash, itching or hives, swelling of the face, lips, or tongue -signs and  symptoms of bleeding such as bloody or black, tarry stools; red or dark brown urine; spitting up blood or brown material that looks like coffee grounds; red spots on the skin; unusual bruising or bleeding from the eyes, gums, or nose -signs and symptoms of a blood clot such as chest pain; shortness of breath; pain, swelling, or warmth in the leg -signs and symptoms of a stroke like changes in vision; confusion; trouble speaking or understanding; severe headaches; sudden numbness or weakness of the face, arm or leg; trouble walking; dizziness; loss of balance or coordination Side effects that usually do not require medical attention (report to your doctor or health care professional if they continue or are bothersome): -headache -pain in arms and legs -pain in mouth -stomach pain This list may not describe all possible side effects. Call your doctor for medical advice about side effects. You may report side effects to FDA at 1-800-FDA-1088. Where should I keep my medicine? This drug is given in a hospital or clinic and will not be stored at home. NOTE: This sheet is a summary. It may not cover all possible information. If you have questions about this medicine, talk to your doctor, pharmacist, or health care provider.  2019 Elsevier/Gold Standard (2017-02-10 11:10:55)

## 2018-07-07 ENCOUNTER — Other Ambulatory Visit: Payer: Self-pay

## 2018-07-07 ENCOUNTER — Inpatient Hospital Stay: Payer: Medicare Other | Attending: Hematology

## 2018-07-07 ENCOUNTER — Inpatient Hospital Stay: Payer: Medicare Other

## 2018-07-07 DIAGNOSIS — N183 Chronic kidney disease, stage 3 (moderate): Secondary | ICD-10-CM | POA: Diagnosis not present

## 2018-07-07 DIAGNOSIS — Z79899 Other long term (current) drug therapy: Secondary | ICD-10-CM | POA: Insufficient documentation

## 2018-07-07 DIAGNOSIS — E538 Deficiency of other specified B group vitamins: Secondary | ICD-10-CM | POA: Diagnosis not present

## 2018-07-07 DIAGNOSIS — E1122 Type 2 diabetes mellitus with diabetic chronic kidney disease: Secondary | ICD-10-CM | POA: Diagnosis not present

## 2018-07-07 DIAGNOSIS — I25810 Atherosclerosis of coronary artery bypass graft(s) without angina pectoris: Secondary | ICD-10-CM | POA: Diagnosis not present

## 2018-07-07 DIAGNOSIS — D693 Immune thrombocytopenic purpura: Secondary | ICD-10-CM | POA: Diagnosis not present

## 2018-07-07 DIAGNOSIS — Z95828 Presence of other vascular implants and grafts: Secondary | ICD-10-CM

## 2018-07-07 LAB — CMP (CANCER CENTER ONLY)
ALT: 14 U/L (ref 0–44)
AST: 16 U/L (ref 15–41)
Albumin: 3.3 g/dL — ABNORMAL LOW (ref 3.5–5.0)
Alkaline Phosphatase: 65 U/L (ref 38–126)
Anion gap: 8 (ref 5–15)
BUN: 31 mg/dL — ABNORMAL HIGH (ref 8–23)
CO2: 25 mmol/L (ref 22–32)
Calcium: 9.3 mg/dL (ref 8.9–10.3)
Chloride: 112 mmol/L — ABNORMAL HIGH (ref 98–111)
Creatinine: 1.5 mg/dL — ABNORMAL HIGH (ref 0.61–1.24)
GFR, Est AFR Am: 45 mL/min — ABNORMAL LOW (ref >60)
GFR, Estimated: 38 mL/min — ABNORMAL LOW (ref >60)
Glucose, Bld: 146 mg/dL — ABNORMAL HIGH (ref 70–99)
Potassium: 4.2 mmol/L (ref 3.5–5.1)
Sodium: 145 mmol/L (ref 135–145)
Total Bilirubin: 0.5 mg/dL (ref 0.3–1.2)
Total Protein: 6.9 g/dL (ref 6.5–8.1)

## 2018-07-07 LAB — CBC WITH DIFFERENTIAL/PLATELET
Abs Immature Granulocytes: 0.02 10*3/uL (ref 0.00–0.07)
Basophils Absolute: 0 10*3/uL (ref 0.0–0.1)
Basophils Relative: 0 %
Eosinophils Absolute: 0 10*3/uL (ref 0.0–0.5)
Eosinophils Relative: 0 %
HCT: 42.2 % (ref 39.0–52.0)
Hemoglobin: 13.1 g/dL (ref 13.0–17.0)
Immature Granulocytes: 0 %
Lymphocytes Relative: 17 %
Lymphs Abs: 0.9 10*3/uL (ref 0.7–4.0)
MCH: 31 pg (ref 26.0–34.0)
MCHC: 31 g/dL (ref 30.0–36.0)
MCV: 99.8 fL (ref 80.0–100.0)
Monocytes Absolute: 0.5 10*3/uL (ref 0.1–1.0)
Monocytes Relative: 10 %
Neutro Abs: 3.7 10*3/uL (ref 1.7–7.7)
Neutrophils Relative %: 73 %
Platelets: 183 10*3/uL (ref 150–400)
RBC: 4.23 MIL/uL (ref 4.22–5.81)
RDW: 12.4 % (ref 11.5–15.5)
WBC: 5.2 10*3/uL (ref 4.0–10.5)
nRBC: 0 % (ref 0.0–0.2)

## 2018-07-07 MED ORDER — ROMIPLOSTIM 250 MCG ~~LOC~~ SOLR
50.0000 ug | SUBCUTANEOUS | Status: DC
  Administered 2018-07-07: 50 ug via SUBCUTANEOUS
  Filled 2018-07-07: qty 0.1

## 2018-07-07 NOTE — Patient Instructions (Signed)
Romiplostim injection What is this medicine? ROMIPLOSTIM (roe mi PLOE stim) helps your body make more platelets. This medicine is used to treat low platelets caused by chronic idiopathic thrombocytopenic purpura (ITP). This medicine may be used for other purposes; ask your health care provider or pharmacist if you have questions. COMMON BRAND NAME(S): Nplate What should I tell my health care provider before I take this medicine? They need to know if you have any of these conditions: -bleeding disorders -bone marrow problem, like blood cancer or myelodysplastic syndrome -history of blood clots -liver disease -surgery to remove your spleen -an unusual or allergic reaction to romiplostim, mannitol, other medicines, foods, dyes, or preservatives -pregnant or trying to get pregnant -breast-feeding How should I use this medicine? This medicine is for injection under the skin. It is given by a health care professional in a hospital or clinic setting. A special MedGuide will be given to you before your injection. Read this information carefully each time. Talk to your pediatrician regarding the use of this medicine in children. While this drug may be prescribed for children as young as 1 year for selected conditions, precautions do apply. Overdosage: If you think you have taken too much of this medicine contact a poison control center or emergency room at once. NOTE: This medicine is only for you. Do not share this medicine with others. What if I miss a dose? It is important not to miss your dose. Call your doctor or health care professional if you are unable to keep an appointment. What may interact with this medicine? Interactions are not expected. This list may not describe all possible interactions. Give your health care provider a list of all the medicines, herbs, non-prescription drugs, or dietary supplements you use. Also tell them if you smoke, drink alcohol, or use illegal drugs. Some items  may interact with your medicine. What should I watch for while using this medicine? Your condition will be monitored carefully while you are receiving this medicine. Visit your prescriber or health care professional for regular checks on your progress and for the needed blood tests. It is important to keep all appointments. What side effects may I notice from receiving this medicine? Side effects that you should report to your doctor or health care professional as soon as possible: -allergic reactions like skin rash, itching or hives, swelling of the face, lips, or tongue -signs and symptoms of bleeding such as bloody or black, tarry stools; red or dark brown urine; spitting up blood or brown material that looks like coffee grounds; red spots on the skin; unusual bruising or bleeding from the eyes, gums, or nose -signs and symptoms of a blood clot such as chest pain; shortness of breath; pain, swelling, or warmth in the leg -signs and symptoms of a stroke like changes in vision; confusion; trouble speaking or understanding; severe headaches; sudden numbness or weakness of the face, arm or leg; trouble walking; dizziness; loss of balance or coordination Side effects that usually do not require medical attention (report to your doctor or health care professional if they continue or are bothersome): -headache -pain in arms and legs -pain in mouth -stomach pain This list may not describe all possible side effects. Call your doctor for medical advice about side effects. You may report side effects to FDA at 1-800-FDA-1088. Where should I keep my medicine? This drug is given in a hospital or clinic and will not be stored at home. NOTE: This sheet is a summary. It may not   cover all possible information. If you have questions about this medicine, talk to your doctor, pharmacist, or health care provider.  2019 Elsevier/Gold Standard (2017-02-10 11:10:55)  

## 2018-07-08 DIAGNOSIS — Z0000 Encounter for general adult medical examination without abnormal findings: Secondary | ICD-10-CM | POA: Diagnosis not present

## 2018-07-08 DIAGNOSIS — E119 Type 2 diabetes mellitus without complications: Secondary | ICD-10-CM | POA: Diagnosis not present

## 2018-07-08 DIAGNOSIS — Z7189 Other specified counseling: Secondary | ICD-10-CM | POA: Diagnosis not present

## 2018-07-08 DIAGNOSIS — J449 Chronic obstructive pulmonary disease, unspecified: Secondary | ICD-10-CM | POA: Diagnosis not present

## 2018-07-08 DIAGNOSIS — I5022 Chronic systolic (congestive) heart failure: Secondary | ICD-10-CM | POA: Diagnosis not present

## 2018-07-08 DIAGNOSIS — D696 Thrombocytopenia, unspecified: Secondary | ICD-10-CM | POA: Diagnosis not present

## 2018-07-21 ENCOUNTER — Other Ambulatory Visit: Payer: Self-pay

## 2018-07-21 ENCOUNTER — Inpatient Hospital Stay: Payer: Medicare Other

## 2018-07-21 DIAGNOSIS — D693 Immune thrombocytopenic purpura: Secondary | ICD-10-CM

## 2018-07-21 DIAGNOSIS — Z79899 Other long term (current) drug therapy: Secondary | ICD-10-CM | POA: Diagnosis not present

## 2018-07-21 DIAGNOSIS — Z95828 Presence of other vascular implants and grafts: Secondary | ICD-10-CM

## 2018-07-21 DIAGNOSIS — N183 Chronic kidney disease, stage 3 (moderate): Secondary | ICD-10-CM | POA: Diagnosis not present

## 2018-07-21 DIAGNOSIS — E538 Deficiency of other specified B group vitamins: Secondary | ICD-10-CM | POA: Diagnosis not present

## 2018-07-21 DIAGNOSIS — E1122 Type 2 diabetes mellitus with diabetic chronic kidney disease: Secondary | ICD-10-CM | POA: Diagnosis not present

## 2018-07-21 DIAGNOSIS — I25810 Atherosclerosis of coronary artery bypass graft(s) without angina pectoris: Secondary | ICD-10-CM | POA: Diagnosis not present

## 2018-07-21 LAB — CBC WITH DIFFERENTIAL/PLATELET
Abs Immature Granulocytes: 0.02 10*3/uL (ref 0.00–0.07)
Basophils Absolute: 0 10*3/uL (ref 0.0–0.1)
Basophils Relative: 0 %
Eosinophils Absolute: 0 10*3/uL (ref 0.0–0.5)
Eosinophils Relative: 1 %
HCT: 39.8 % (ref 39.0–52.0)
Hemoglobin: 12.6 g/dL — ABNORMAL LOW (ref 13.0–17.0)
Immature Granulocytes: 0 %
Lymphocytes Relative: 13 %
Lymphs Abs: 0.7 10*3/uL (ref 0.7–4.0)
MCH: 31 pg (ref 26.0–34.0)
MCHC: 31.7 g/dL (ref 30.0–36.0)
MCV: 97.8 fL (ref 80.0–100.0)
Monocytes Absolute: 0.5 10*3/uL (ref 0.1–1.0)
Monocytes Relative: 10 %
Neutro Abs: 4.2 10*3/uL (ref 1.7–7.7)
Neutrophils Relative %: 76 %
Platelets: 143 10*3/uL — ABNORMAL LOW (ref 150–400)
RBC: 4.07 MIL/uL — ABNORMAL LOW (ref 4.22–5.81)
RDW: 12.3 % (ref 11.5–15.5)
WBC: 5.6 10*3/uL (ref 4.0–10.5)
nRBC: 0 % (ref 0.0–0.2)

## 2018-07-21 MED ORDER — CYANOCOBALAMIN 1000 MCG/ML IJ SOLN
INTRAMUSCULAR | Status: AC
  Filled 2018-07-21: qty 1

## 2018-07-21 MED ORDER — CYANOCOBALAMIN 1000 MCG/ML IJ SOLN
1000.0000 ug | INTRAMUSCULAR | Status: DC
  Administered 2018-07-21: 1000 ug via SUBCUTANEOUS

## 2018-07-21 MED ORDER — ROMIPLOSTIM 250 MCG ~~LOC~~ SOLR
50.0000 ug | SUBCUTANEOUS | Status: DC
  Administered 2018-07-21: 50 ug via SUBCUTANEOUS
  Filled 2018-07-21: qty 0.1

## 2018-07-21 NOTE — Patient Instructions (Signed)
Romiplostim injection What is this medicine? ROMIPLOSTIM (roe mi PLOE stim) helps your body make more platelets. This medicine is used to treat low platelets caused by chronic idiopathic thrombocytopenic purpura (ITP). This medicine may be used for other purposes; ask your health care provider or pharmacist if you have questions. COMMON BRAND NAME(S): Nplate What should I tell my health care provider before I take this medicine? They need to know if you have any of these conditions: -bleeding disorders -bone marrow problem, like blood cancer or myelodysplastic syndrome -history of blood clots -liver disease -surgery to remove your spleen -an unusual or allergic reaction to romiplostim, mannitol, other medicines, foods, dyes, or preservatives -pregnant or trying to get pregnant -breast-feeding How should I use this medicine? This medicine is for injection under the skin. It is given by a health care professional in a hospital or clinic setting. A special MedGuide will be given to you before your injection. Read this information carefully each time. Talk to your pediatrician regarding the use of this medicine in children. While this drug may be prescribed for children as young as 1 year for selected conditions, precautions do apply. Overdosage: If you think you have taken too much of this medicine contact a poison control center or emergency room at once. NOTE: This medicine is only for you. Do not share this medicine with others. What if I miss a dose? It is important not to miss your dose. Call your doctor or health care professional if you are unable to keep an appointment. What may interact with this medicine? Interactions are not expected. This list may not describe all possible interactions. Give your health care provider a list of all the medicines, herbs, non-prescription drugs, or dietary supplements you use. Also tell them if you smoke, drink alcohol, or use illegal drugs. Some items  may interact with your medicine. What should I watch for while using this medicine? Your condition will be monitored carefully while you are receiving this medicine. Visit your prescriber or health care professional for regular checks on your progress and for the needed blood tests. It is important to keep all appointments. What side effects may I notice from receiving this medicine? Side effects that you should report to your doctor or health care professional as soon as possible: -allergic reactions like skin rash, itching or hives, swelling of the face, lips, or tongue -signs and symptoms of bleeding such as bloody or black, tarry stools; red or dark brown urine; spitting up blood or brown material that looks like coffee grounds; red spots on the skin; unusual bruising or bleeding from the eyes, gums, or nose -signs and symptoms of a blood clot such as chest pain; shortness of breath; pain, swelling, or warmth in the leg -signs and symptoms of a stroke like changes in vision; confusion; trouble speaking or understanding; severe headaches; sudden numbness or weakness of the face, arm or leg; trouble walking; dizziness; loss of balance or coordination Side effects that usually do not require medical attention (report to your doctor or health care professional if they continue or are bothersome): -headache -pain in arms and legs -pain in mouth -stomach pain This list may not describe all possible side effects. Call your doctor for medical advice about side effects. You may report side effects to FDA at 1-800-FDA-1088. Where should I keep my medicine? This drug is given in a hospital or clinic and will not be stored at home. NOTE: This sheet is a summary. It may not  cover all possible information. If you have questions about this medicine, talk to your doctor, pharmacist, or health care provider.  2019 Elsevier/Gold Standard (2017-02-10 11:10:55) Cyanocobalamin, Pyridoxine, and Folate What is  this medicine? A multivitamin containing folic acid, vitamin B6, and vitamin B12. This medicine may be used for other purposes; ask your health care provider or pharmacist if you have questions. COMMON BRAND NAME(S): AllanFol RX, AllanTex, Av-Vite FB, B Complex with Folic Acid, ComBgen, FaBB, Folamin, Folastin, [place], [place], [place], [place], [place], [employer], [place] RX 2.2, [place] [address], [place] 2.2, Foltabs 800, Foltx, Homocysteine Formula, Niva-Fol, NuFol, [employer], Virt-Gard, Virt-Vite, Virt-Vite Tamms, Vita-Respa What should I tell my health care provider before I take this medicine? They need to know if you have any of these conditions: -bleeding or clotting disorder -history of anemia of any type -other chronic health condition -an unusual or allergic reaction to vitamins, other medicines, foods, dyes, or preservatives -pregnant or trying to get pregnant -breast-feeding How should I use this medicine? Take by mouth with a glass of water. May take with food. Follow the directions on the prescription label. It is usually given once a day. Do not take your medicine more often than directed. Contact your pediatrician regarding the use of this medicine in children. Special care may be needed. Overdosage: If you think you have taken too much of this medicine contact a poison control center or emergency room at once. NOTE: This medicine is only for you. Do not share this medicine with others. What if I miss a dose? If you miss a dose, take it as soon as you can. If it is almost time for your next dose, take only that dose. Do not take double or extra doses. What may interact with this medicine? -levodopa This list may not describe all possible interactions. Give your health care provider a list of all the medicines, herbs, non-prescription drugs, or dietary supplements you use. Also tell them if you smoke, drink alcohol, or use illegal drugs. Some items may interact with your medicine. What  should I watch for while using this medicine? See your health care professional for regular checks on your progress. Remember that vitamin supplements do not replace the need for good nutrition from a balanced diet. What side effects may I notice from receiving this medicine? Side effects that you should report to your doctor or health care professional as soon as possible: -allergic reaction such as skin rash or difficulty breathing -vomiting Side effects that usually do not require medical attention (report to your doctor or health care professional if they continue or are bothersome): -nausea -stomach upset This list may not describe all possible side effects. Call your doctor for medical advice about side effects. You may report side effects to FDA at 1-800-FDA-1088. Where should I keep my medicine? Keep out of the reach of children. Most vitamins should be stored at controlled room temperature. Check your specific product directions. Protect from heat and moisture. Throw away any unused medicine after the expiration date. NOTE: This sheet is a summary. It may not cover all possible information. If you have questions about this medicine, talk to your doctor, pharmacist, or health care provider.  2019 Elsevier/Gold Standard (2007-04-04 00:59:55)

## 2018-07-23 DIAGNOSIS — C44329 Squamous cell carcinoma of skin of other parts of face: Secondary | ICD-10-CM | POA: Diagnosis not present

## 2018-07-23 DIAGNOSIS — L570 Actinic keratosis: Secondary | ICD-10-CM | POA: Diagnosis not present

## 2018-07-30 DIAGNOSIS — Z4802 Encounter for removal of sutures: Secondary | ICD-10-CM | POA: Diagnosis not present

## 2018-08-04 ENCOUNTER — Inpatient Hospital Stay: Payer: Medicare Other

## 2018-08-04 ENCOUNTER — Other Ambulatory Visit: Payer: Self-pay

## 2018-08-04 ENCOUNTER — Inpatient Hospital Stay: Payer: Medicare Other | Attending: Hematology

## 2018-08-04 DIAGNOSIS — E538 Deficiency of other specified B group vitamins: Secondary | ICD-10-CM | POA: Diagnosis not present

## 2018-08-04 DIAGNOSIS — D693 Immune thrombocytopenic purpura: Secondary | ICD-10-CM

## 2018-08-04 DIAGNOSIS — Z95828 Presence of other vascular implants and grafts: Secondary | ICD-10-CM

## 2018-08-04 LAB — CMP (CANCER CENTER ONLY)
ALT: 9 U/L (ref 0–44)
AST: 16 U/L (ref 15–41)
Albumin: 3.4 g/dL — ABNORMAL LOW (ref 3.5–5.0)
Alkaline Phosphatase: 63 U/L (ref 38–126)
Anion gap: 10 (ref 5–15)
BUN: 26 mg/dL — ABNORMAL HIGH (ref 8–23)
CO2: 23 mmol/L (ref 22–32)
Calcium: 9 mg/dL (ref 8.9–10.3)
Chloride: 109 mmol/L (ref 98–111)
Creatinine: 1.53 mg/dL — ABNORMAL HIGH (ref 0.61–1.24)
GFR, Est AFR Am: 44 mL/min — ABNORMAL LOW (ref >60)
GFR, Estimated: 38 mL/min — ABNORMAL LOW (ref >60)
Glucose, Bld: 150 mg/dL — ABNORMAL HIGH (ref 70–99)
Potassium: 4 mmol/L (ref 3.5–5.1)
Sodium: 142 mmol/L (ref 135–145)
Total Bilirubin: 0.5 mg/dL (ref 0.3–1.2)
Total Protein: 7.1 g/dL (ref 6.5–8.1)

## 2018-08-04 LAB — CBC WITH DIFFERENTIAL/PLATELET
Abs Immature Granulocytes: 0.03 10*3/uL (ref 0.00–0.07)
Basophils Absolute: 0 10*3/uL (ref 0.0–0.1)
Basophils Relative: 0 %
Eosinophils Absolute: 0.1 10*3/uL (ref 0.0–0.5)
Eosinophils Relative: 1 %
HCT: 43 % (ref 39.0–52.0)
Hemoglobin: 13.4 g/dL (ref 13.0–17.0)
Immature Granulocytes: 1 %
Lymphocytes Relative: 22 %
Lymphs Abs: 1.3 10*3/uL (ref 0.7–4.0)
MCH: 30.9 pg (ref 26.0–34.0)
MCHC: 31.2 g/dL (ref 30.0–36.0)
MCV: 99.1 fL (ref 80.0–100.0)
Monocytes Absolute: 0.5 10*3/uL (ref 0.1–1.0)
Monocytes Relative: 9 %
Neutro Abs: 3.9 10*3/uL (ref 1.7–7.7)
Neutrophils Relative %: 67 %
Platelets: 161 10*3/uL (ref 150–400)
RBC: 4.34 MIL/uL (ref 4.22–5.81)
RDW: 12.4 % (ref 11.5–15.5)
WBC: 5.7 10*3/uL (ref 4.0–10.5)
nRBC: 0 % (ref 0.0–0.2)

## 2018-08-04 MED ORDER — ROMIPLOSTIM 250 MCG ~~LOC~~ SOLR
50.0000 ug | Freq: Once | SUBCUTANEOUS | Status: AC
  Administered 2018-08-04: 50 ug via SUBCUTANEOUS
  Filled 2018-08-04: qty 0.1

## 2018-08-05 DIAGNOSIS — E782 Mixed hyperlipidemia: Secondary | ICD-10-CM | POA: Diagnosis not present

## 2018-08-05 DIAGNOSIS — D696 Thrombocytopenia, unspecified: Secondary | ICD-10-CM | POA: Diagnosis not present

## 2018-08-05 DIAGNOSIS — E119 Type 2 diabetes mellitus without complications: Secondary | ICD-10-CM | POA: Diagnosis not present

## 2018-08-05 DIAGNOSIS — I5022 Chronic systolic (congestive) heart failure: Secondary | ICD-10-CM | POA: Diagnosis not present

## 2018-08-18 ENCOUNTER — Other Ambulatory Visit: Payer: Self-pay

## 2018-08-18 ENCOUNTER — Inpatient Hospital Stay: Payer: Medicare Other

## 2018-08-18 VITALS — BP 158/68 | HR 72 | Temp 98.2°F | Resp 18

## 2018-08-18 DIAGNOSIS — D693 Immune thrombocytopenic purpura: Secondary | ICD-10-CM

## 2018-08-18 DIAGNOSIS — Z95828 Presence of other vascular implants and grafts: Secondary | ICD-10-CM

## 2018-08-18 DIAGNOSIS — E538 Deficiency of other specified B group vitamins: Secondary | ICD-10-CM | POA: Diagnosis not present

## 2018-08-18 LAB — CBC WITH DIFFERENTIAL/PLATELET
Abs Immature Granulocytes: 0.03 10*3/uL (ref 0.00–0.07)
Basophils Absolute: 0 10*3/uL (ref 0.0–0.1)
Basophils Relative: 0 %
Eosinophils Absolute: 0.1 10*3/uL (ref 0.0–0.5)
Eosinophils Relative: 1 %
HCT: 41.1 % (ref 39.0–52.0)
Hemoglobin: 13 g/dL (ref 13.0–17.0)
Immature Granulocytes: 1 %
Lymphocytes Relative: 18 %
Lymphs Abs: 1 10*3/uL (ref 0.7–4.0)
MCH: 31 pg (ref 26.0–34.0)
MCHC: 31.6 g/dL (ref 30.0–36.0)
MCV: 97.9 fL (ref 80.0–100.0)
Monocytes Absolute: 0.5 10*3/uL (ref 0.1–1.0)
Monocytes Relative: 9 %
Neutro Abs: 3.7 10*3/uL (ref 1.7–7.7)
Neutrophils Relative %: 71 %
Platelets: 155 10*3/uL (ref 150–400)
RBC: 4.2 MIL/uL — ABNORMAL LOW (ref 4.22–5.81)
RDW: 12.6 % (ref 11.5–15.5)
WBC: 5.2 10*3/uL (ref 4.0–10.5)
nRBC: 0 % (ref 0.0–0.2)

## 2018-08-18 MED ORDER — ROMIPLOSTIM 250 MCG ~~LOC~~ SOLR
50.0000 ug | Freq: Once | SUBCUTANEOUS | Status: AC
  Administered 2018-08-18: 50 ug via SUBCUTANEOUS
  Filled 2018-08-18: qty 0.1

## 2018-08-18 MED ORDER — CYANOCOBALAMIN 1000 MCG/ML IJ SOLN
1000.0000 ug | Freq: Once | INTRAMUSCULAR | Status: AC
  Administered 2018-08-18: 11:00:00 1000 ug via SUBCUTANEOUS

## 2018-09-01 ENCOUNTER — Inpatient Hospital Stay: Payer: Medicare Other

## 2018-09-01 ENCOUNTER — Other Ambulatory Visit: Payer: Self-pay

## 2018-09-01 ENCOUNTER — Inpatient Hospital Stay: Payer: Medicare Other | Attending: Hematology

## 2018-09-01 VITALS — BP 148/62 | HR 72 | Temp 98.2°F | Resp 18

## 2018-09-01 DIAGNOSIS — Z95828 Presence of other vascular implants and grafts: Secondary | ICD-10-CM

## 2018-09-01 DIAGNOSIS — D693 Immune thrombocytopenic purpura: Secondary | ICD-10-CM

## 2018-09-01 DIAGNOSIS — E538 Deficiency of other specified B group vitamins: Secondary | ICD-10-CM | POA: Diagnosis not present

## 2018-09-01 LAB — CBC WITH DIFFERENTIAL/PLATELET
Abs Immature Granulocytes: 0.03 10*3/uL (ref 0.00–0.07)
Basophils Absolute: 0 10*3/uL (ref 0.0–0.1)
Basophils Relative: 0 %
Eosinophils Absolute: 0.1 10*3/uL (ref 0.0–0.5)
Eosinophils Relative: 1 %
HCT: 42.3 % (ref 39.0–52.0)
Hemoglobin: 13.2 g/dL (ref 13.0–17.0)
Immature Granulocytes: 1 %
Lymphocytes Relative: 20 %
Lymphs Abs: 1.1 10*3/uL (ref 0.7–4.0)
MCH: 30.6 pg (ref 26.0–34.0)
MCHC: 31.2 g/dL (ref 30.0–36.0)
MCV: 97.9 fL (ref 80.0–100.0)
Monocytes Absolute: 0.4 10*3/uL (ref 0.1–1.0)
Monocytes Relative: 8 %
Neutro Abs: 3.8 10*3/uL (ref 1.7–7.7)
Neutrophils Relative %: 70 %
Platelets: 146 10*3/uL — ABNORMAL LOW (ref 150–400)
RBC: 4.32 MIL/uL (ref 4.22–5.81)
RDW: 12.8 % (ref 11.5–15.5)
WBC: 5.4 10*3/uL (ref 4.0–10.5)
nRBC: 0 % (ref 0.0–0.2)

## 2018-09-01 MED ORDER — CYANOCOBALAMIN 1000 MCG/ML IJ SOLN
1000.0000 ug | INTRAMUSCULAR | Status: DC
  Administered 2018-09-01: 1000 ug via SUBCUTANEOUS

## 2018-09-01 MED ORDER — ROMIPLOSTIM 250 MCG ~~LOC~~ SOLR
50.0000 ug | SUBCUTANEOUS | Status: DC
  Administered 2018-09-01: 50 ug via SUBCUTANEOUS
  Filled 2018-09-01: qty 0.1

## 2018-09-09 DIAGNOSIS — D693 Immune thrombocytopenic purpura: Secondary | ICD-10-CM | POA: Diagnosis not present

## 2018-09-09 DIAGNOSIS — I10 Essential (primary) hypertension: Secondary | ICD-10-CM | POA: Diagnosis not present

## 2018-09-09 DIAGNOSIS — J449 Chronic obstructive pulmonary disease, unspecified: Secondary | ICD-10-CM | POA: Diagnosis not present

## 2018-09-09 DIAGNOSIS — E119 Type 2 diabetes mellitus without complications: Secondary | ICD-10-CM | POA: Diagnosis not present

## 2018-09-15 ENCOUNTER — Inpatient Hospital Stay: Payer: Medicare Other

## 2018-09-15 ENCOUNTER — Other Ambulatory Visit: Payer: Self-pay

## 2018-09-15 VITALS — BP 142/60 | HR 78 | Temp 98.2°F | Resp 18

## 2018-09-15 DIAGNOSIS — Z95828 Presence of other vascular implants and grafts: Secondary | ICD-10-CM

## 2018-09-15 DIAGNOSIS — E538 Deficiency of other specified B group vitamins: Secondary | ICD-10-CM | POA: Diagnosis not present

## 2018-09-15 DIAGNOSIS — D693 Immune thrombocytopenic purpura: Secondary | ICD-10-CM

## 2018-09-15 LAB — CBC WITH DIFFERENTIAL/PLATELET
Abs Immature Granulocytes: 0.03 10*3/uL (ref 0.00–0.07)
Basophils Absolute: 0 10*3/uL (ref 0.0–0.1)
Basophils Relative: 0 %
Eosinophils Absolute: 0.1 10*3/uL (ref 0.0–0.5)
Eosinophils Relative: 1 %
HCT: 41.4 % (ref 39.0–52.0)
Hemoglobin: 13.1 g/dL (ref 13.0–17.0)
Immature Granulocytes: 1 %
Lymphocytes Relative: 19 %
Lymphs Abs: 1 10*3/uL (ref 0.7–4.0)
MCH: 31.2 pg (ref 26.0–34.0)
MCHC: 31.6 g/dL (ref 30.0–36.0)
MCV: 98.6 fL (ref 80.0–100.0)
Monocytes Absolute: 0.5 10*3/uL (ref 0.1–1.0)
Monocytes Relative: 9 %
Neutro Abs: 3.6 10*3/uL (ref 1.7–7.7)
Neutrophils Relative %: 70 %
Platelets: 181 10*3/uL (ref 150–400)
RBC: 4.2 MIL/uL — ABNORMAL LOW (ref 4.22–5.81)
RDW: 12.8 % (ref 11.5–15.5)
WBC: 5.1 10*3/uL (ref 4.0–10.5)
nRBC: 0 % (ref 0.0–0.2)

## 2018-09-15 MED ORDER — ROMIPLOSTIM 250 MCG ~~LOC~~ SOLR
50.0000 ug | SUBCUTANEOUS | Status: DC
  Administered 2018-09-15: 50 ug via SUBCUTANEOUS
  Filled 2018-09-15: qty 0.1

## 2018-09-29 ENCOUNTER — Inpatient Hospital Stay: Payer: Medicare Other

## 2018-09-29 ENCOUNTER — Other Ambulatory Visit: Payer: Self-pay | Admitting: Hematology

## 2018-09-29 ENCOUNTER — Inpatient Hospital Stay: Payer: Medicare Other | Attending: Hematology

## 2018-09-29 ENCOUNTER — Other Ambulatory Visit: Payer: Self-pay

## 2018-09-29 VITALS — BP 138/62 | HR 78 | Temp 98.2°F | Resp 18

## 2018-09-29 DIAGNOSIS — E785 Hyperlipidemia, unspecified: Secondary | ICD-10-CM | POA: Insufficient documentation

## 2018-09-29 DIAGNOSIS — Z951 Presence of aortocoronary bypass graft: Secondary | ICD-10-CM | POA: Insufficient documentation

## 2018-09-29 DIAGNOSIS — N400 Enlarged prostate without lower urinary tract symptoms: Secondary | ICD-10-CM | POA: Insufficient documentation

## 2018-09-29 DIAGNOSIS — I129 Hypertensive chronic kidney disease with stage 1 through stage 4 chronic kidney disease, or unspecified chronic kidney disease: Secondary | ICD-10-CM | POA: Diagnosis not present

## 2018-09-29 DIAGNOSIS — D693 Immune thrombocytopenic purpura: Secondary | ICD-10-CM

## 2018-09-29 DIAGNOSIS — I2510 Atherosclerotic heart disease of native coronary artery without angina pectoris: Secondary | ICD-10-CM | POA: Diagnosis not present

## 2018-09-29 DIAGNOSIS — E538 Deficiency of other specified B group vitamins: Secondary | ICD-10-CM | POA: Insufficient documentation

## 2018-09-29 DIAGNOSIS — Z95828 Presence of other vascular implants and grafts: Secondary | ICD-10-CM

## 2018-09-29 DIAGNOSIS — Z79899 Other long term (current) drug therapy: Secondary | ICD-10-CM | POA: Diagnosis not present

## 2018-09-29 DIAGNOSIS — E119 Type 2 diabetes mellitus without complications: Secondary | ICD-10-CM

## 2018-09-29 DIAGNOSIS — E1140 Type 2 diabetes mellitus with diabetic neuropathy, unspecified: Secondary | ICD-10-CM | POA: Diagnosis not present

## 2018-09-29 DIAGNOSIS — Z87891 Personal history of nicotine dependence: Secondary | ICD-10-CM | POA: Diagnosis not present

## 2018-09-29 DIAGNOSIS — I509 Heart failure, unspecified: Secondary | ICD-10-CM | POA: Insufficient documentation

## 2018-09-29 DIAGNOSIS — M1990 Unspecified osteoarthritis, unspecified site: Secondary | ICD-10-CM | POA: Insufficient documentation

## 2018-09-29 DIAGNOSIS — N189 Chronic kidney disease, unspecified: Secondary | ICD-10-CM | POA: Diagnosis not present

## 2018-09-29 DIAGNOSIS — E1122 Type 2 diabetes mellitus with diabetic chronic kidney disease: Secondary | ICD-10-CM | POA: Diagnosis not present

## 2018-09-29 LAB — CBC WITH DIFFERENTIAL/PLATELET
Abs Immature Granulocytes: 0.03 10*3/uL (ref 0.00–0.07)
Basophils Absolute: 0 10*3/uL (ref 0.0–0.1)
Basophils Relative: 0 %
Eosinophils Absolute: 0.1 10*3/uL (ref 0.0–0.5)
Eosinophils Relative: 1 %
HCT: 40.8 % (ref 39.0–52.0)
Hemoglobin: 12.8 g/dL — ABNORMAL LOW (ref 13.0–17.0)
Immature Granulocytes: 1 %
Lymphocytes Relative: 18 %
Lymphs Abs: 1 10*3/uL (ref 0.7–4.0)
MCH: 30.7 pg (ref 26.0–34.0)
MCHC: 31.4 g/dL (ref 30.0–36.0)
MCV: 97.8 fL (ref 80.0–100.0)
Monocytes Absolute: 0.3 10*3/uL (ref 0.1–1.0)
Monocytes Relative: 6 %
Neutro Abs: 4 10*3/uL (ref 1.7–7.7)
Neutrophils Relative %: 74 %
Platelets: 161 10*3/uL (ref 150–400)
RBC: 4.17 MIL/uL — ABNORMAL LOW (ref 4.22–5.81)
RDW: 12.5 % (ref 11.5–15.5)
WBC: 5.4 10*3/uL (ref 4.0–10.5)
nRBC: 0 % (ref 0.0–0.2)

## 2018-09-29 MED ORDER — CYANOCOBALAMIN 1000 MCG/ML IJ SOLN
1000.0000 ug | Freq: Once | INTRAMUSCULAR | Status: AC
  Administered 2018-09-29: 1000 ug via SUBCUTANEOUS

## 2018-09-29 MED ORDER — ROMIPLOSTIM 250 MCG ~~LOC~~ SOLR
50.0000 ug | Freq: Once | SUBCUTANEOUS | Status: AC
  Administered 2018-09-29: 50 ug via SUBCUTANEOUS
  Filled 2018-09-29: qty 0.1

## 2018-10-12 DIAGNOSIS — I10 Essential (primary) hypertension: Secondary | ICD-10-CM | POA: Diagnosis not present

## 2018-10-12 DIAGNOSIS — E119 Type 2 diabetes mellitus without complications: Secondary | ICD-10-CM | POA: Diagnosis not present

## 2018-10-12 DIAGNOSIS — I5022 Chronic systolic (congestive) heart failure: Secondary | ICD-10-CM | POA: Diagnosis not present

## 2018-10-12 DIAGNOSIS — J449 Chronic obstructive pulmonary disease, unspecified: Secondary | ICD-10-CM | POA: Diagnosis not present

## 2018-10-12 NOTE — Progress Notes (Signed)
.    Hematology Oncology Clinic note:   Date of service: 10/13/18   Patient Care Team: Clovia Cuff, MD as PCP - General (Internal Medicine) Minus Breeding, MD as PCP - Cardiology (Cardiology) Rico Sheehan (Hematology and Oncology)  CHIEF COMPLAINTS/PURPOSE OF CONSULTATION: Follow-up for ITP   Diagnosis: Idiopathic thrombocytopenic purpura   Current treatment: Nplate q2 weekly to maintain reasonable platelet counts >50K.  Requirement have ranged from 57mcg/kg to 80mcg/kg. Recently stable counts with 0.5-1 mcg/kg qweekly  Previous treatment: Steroids, IVIG.  HISTORY OF PRESENTING ILLNESS:  please see my previous clinic note from 09/28/2014 for details of initial presentation and course of treatment.   INTERVAL HISTORY:   Lawrence Warner presents to the clinic today for f/u of his ITP. The patient's last visit with [place] was on 06/09/2018. The pt reports that he is doing well overall.  The pt reports that he has been following appropriate pandemic precautions. He notes that his daughter ordered oxygen for him, but that it was never delivered. He notes that his oxygen levels are usually around 93%.   Lab results today (10/13/18) of CBC w/diff is as follows: all values are WNL. Hgb A1c at 7.1  On review of systems, pt reports normal dietary habits and denies infection issues, bleeding issues, bruising, and any other symptoms.    MEDICAL HISTORY:  Past Medical History:  Diagnosis Date  . BPH (benign prostatic hypertrophy)   . Chronic heart failure ([place])   . Chronic renal insufficiency   . Coronary artery disease    CABG in 2000  . Diabetes type 2, controlled ([place])    Diet-controlled  . Diabetic neuropathy ([place])   . Dyslipidemia   . Hypertension   . ITP (idiopathic thrombocytopenic purpura)   . Osteoarthritis   . Pneumonia     SURGICAL HISTORY: Past Surgical History:  Procedure Laterality Date  . CHOLECYSTECTOMY    . CORONARY ARTERY BYPASS GRAFT  2000  . Right  hip replacement  2014  . TONSILLECTOMY      SOCIAL HISTORY: Social History   Socioeconomic History  . Marital status: Single    Spouse name: Not on file  . Number of children: Not on file  . Years of education: Not on file  . Highest education level: Not on file  Occupational History  . Occupation: Teaching laboratory technician  . Financial resource strain: Not on file  . Food insecurity    Worry: Not on file    Inability: Not on file  . Transportation needs    Medical: Not on file    Non-medical: Not on file  Tobacco Use  . Smoking status: Former Smoker    Packs/day: 2.00    Years: 25.00    Pack years: 50.00  . Smokeless tobacco: Never Used  Substance and Sexual Activity  . Alcohol use: No  . Drug use: No  . Sexual activity: Not Currently  Lifestyle  . Physical activity    Days per week: Not on file    Minutes per session: Not on file  . Stress: Not on file  Relationships  . Social Herbalist on phone: Not on file    Gets together: Not on file    Attends religious service: Not on file    Active member of club or organization: Not on file    Attends meetings of clubs or organizations: Not on file    Relationship status: Not on file  . Intimate partner violence  Fear of current or ex partner: Not on file    Emotionally abused: Not on file    Physically abused: Not on file    Forced sexual activity: Not on file  Other Topics Concern  . Not on file  Social History Narrative    Two children.     He is living in [place] which is an assisted living facility. One of his son is also moving to [facility] and has a Designer, jewellery in religious studies. Patient notes that his other son is a Systems analyst with multiple awards.  FAMILY HISTORY: Family History  Problem Relation Age of Onset  . Lung disease Father   . Cancer Brother     ALLERGIES:  is allergic to alfuzosin hcl er; imdur [isosorbide dinitrate]; penicillins; and tape.  MEDICATIONS:   Current Outpatient Medications  Medication Sig Dispense Refill  . acetaminophen (TYLENOL) 500 MG tablet Take 1,500 mg by mouth daily.     . brimonidine (ALPHAGAN) 0.2 % ophthalmic solution Place 1 drop into both eyes 3 (three) times daily.    . cycloSPORINE (RESTASIS) 0.05 % ophthalmic emulsion Place 1 drop into both eyes every 12 (twelve) hours.     . Dextran 70-Hypromellose (ARTIFICIAL TEARS PF OP) Place 1 drop into both eyes 4 (four) times daily.     Marland Kitchen doxycycline (VIBRAMYCIN) 100 MG capsule Take 1 capsule (100 mg total) by mouth 2 (two) times daily. 20 capsule 0  . hydroxypropyl methylcellulose / hypromellose (ISOPTO TEARS / GONIOVISC) 2.5 % ophthalmic solution Place 1 drop into both eyes 3 (three) times daily as needed for dry eyes.    Marland Kitchen losartan (COZAAR) 100 MG tablet Take 100 mg by mouth daily.    . magnesium oxide (MAG-OX) 400 MG tablet Take 400 mg daily by mouth.    . Multiple Vitamin (MULTIVITAMIN WITH MINERALS) TABS tablet Take 1 tablet by mouth daily.    Marland Kitchen omeprazole (PRILOSEC) 20 MG capsule Take 1 capsule (20 mg total) by mouth daily before breakfast.    . simvastatin (ZOCOR) 20 MG tablet Take 20 mg by mouth daily.     No current facility-administered medications for this visit.    Facility-Administered Medications Ordered in Other Visits  Medication Dose Route Frequency Provider Last Rate Last Dose  . cyanocobalamin ((VITAMIN B-12)) injection 1,000 mcg  1,000 mcg Subcutaneous Q30 days [name], MD   1,000 mcg at 03/13/16 1348  . cyanocobalamin ((VITAMIN B-12)) injection 1,000 mcg  1,000 mcg Subcutaneous Q30 days [name], MD   1,000 mcg at 05/14/16 1001  . cyanocobalamin ((VITAMIN B-12)) injection 1,000 mcg  1,000 mcg Subcutaneous Q30 days [name], MD   1,000 mcg at 09/10/16 1022  . cyanocobalamin ((VITAMIN B-12)) injection 1,000 mcg  1,000 mcg Subcutaneous Q30 days [name], MD   1,000 mcg at 12/03/16 1038  . romiPLOStim (NPLATE)  injection 50 mcg  0.5 mcg/kg Subcutaneous Weekly [name], MD   50 mcg at 12/03/16 1038  . romiPLOStim (NPLATE) injection 50 mcg  50 mcg Subcutaneous Weekly [name], MD   50 mcg at 02/11/17 1039  . romiPLOStim (NPLATE) injection 50 mcg  50 mcg Subcutaneous Weekly [name], MD   50 mcg at 04/01/17 1150  . romiPLOStim (NPLATE) injection 50 mcg  50 mcg Subcutaneous Once [name], MD       Romiplostim 5 mcg/kg weekly    REVIEW OF SYSTEMS:   A 10+ POINT REVIEW OF  SYSTEMS WAS OBTAINED including neurology, dermatology, psychiatry, cardiac, respiratory, lymph, extremities, GI, GU, Musculoskeletal, constitutional, breasts, reproductive, HEENT.  All pertinent positives are noted in the HPI.  All others are negative.    PHYSICAL EXAMINATION: ECOG PERFORMANCE STATUS: 1 - Symptomatic but completely ambulatory  Vitals:   10/13/18 1103  BP: (!) 168/56  Pulse: 61  Resp: 17  Temp: 99.1 F (37.3 C)  SpO2: 95%   There were no vitals filed for this visit.   Examined in a wheelchair  GENERAL:alert, in no acute distress and comfortable SKIN: no acute rashes, no significant lesions EYES: conjunctiva are pink and non-injected, sclera anicteric OROPHARYNX: MMM, no exudates, no oropharyngeal erythema or ulceration NECK: supple, no JVD LYMPH:  no palpable lymphadenopathy in the cervical, axillary or inguinal regions LUNGS: clear to auscultation b/l with normal respiratory effort HEART: regular rate & rhythm ABDOMEN:  normoactive bowel sounds , non tender, not distended. Extremity: no pedal edema PSYCH: alert & oriented x 3 with fluent speech NEURO: no focal motor/sensory deficits   LABORATORY DATA:  . CBC Latest Ref Rng & Units 10/13/2018 09/29/2018 09/15/2018  WBC 4.0 - 10.5 K/uL 5.2 5.4 5.1  Hemoglobin 13.0 - 17.0 g/dL 13.1 12.8(L) 13.1  Hematocrit 39.0 - 52.0 % 41.6 40.8 41.4  Platelets 150 - 400 K/uL 163 161 181    CMP Latest Ref Rng & Units  08/04/2018 07/07/2018 05/12/2018  Glucose 70 - 99 mg/dL 150(H) 146(H) 148(H)  BUN 8 - 23 mg/dL 26(H) 31(H) 22  Creatinine 0.61 - 1.24 mg/dL 1.53(H) 1.50(H) 1.55(H)  Sodium 135 - 145 mmol/L 142 145 143  Potassium 3.5 - 5.1 mmol/L 4.0 4.2 4.7  Chloride 98 - 111 mmol/L 109 112(H) 110  CO2 22 - 32 mmol/L 23 25 23   Calcium 8.9 - 10.3 mg/dL 9.0 9.3 9.2  Total Protein 6.5 - 8.1 g/dL 7.1 6.9 7.0  Total Bilirubin 0.3 - 1.2 mg/dL 0.5 0.5 0.6  Alkaline Phos 38 - 126 U/L 63 65 66  AST 15 - 41 U/L 16 16 16   ALT 0 - 44 U/L 9 14 10     ASSESSMENT & PLAN:   85 y.o. Caucasian male with multiple medical comorbidities with  #1 Chronic ITP since 1989. Has previously been responsive to steroids and has received IVIG on one occasion. He has been maintained on 3-35mcg/kg weekly of Nplate since mid 4008 initially with Dr. Arvin Collard at [facility] and then with Dr. Jimmie Molly at [facility] in [place].  No issues with bleeding . Ultrasound abdomen showed normal spleen size SPEP- no obvious monoclonal protein. IFE showed possibility of an restrictive bands in the IgG and Lambda lanes. Patient's platelet counts have remained remarkably stable at Romiplostim doses of  0.5 mcg/kg every 2 weeks   #2 H/o B12 deficiency  #3  Coronary artery disease status post CABG in year 2000. Not on aspirin due to his ITP . BNP was elevated. Previous ECHO showed mildly reduced systolic function and LVH  -followed by Dr Minus Breeding   #4 chronic kidney disease  -Continue to management as per primary care physician  #5 Previous respiratory infection Resolved and now off oxygen.   PLAN:  -Discussed pt labwork today, 10/13/18; all values are WNL. -Discussed Hgb A1c at 7.1 -Discussed pandemic safety -Discussed current medications and medication changes -Discussed holding N-plate and retesting platelet levels every 2 weeks; he can receive the N-plate if his platelets are less than 100K.    FOLLOW UP: Continue labs  q2weeks with Nplate shot prn for [id number] RTC with Dr Irene Limbo in 12 weeks   The total time spent in the appt was 15 minutes and more than 50% was on counseling and direct patient cares.  Sullivan Lone MD MS Hematology/Oncology Physician [facility]  (Office):       [phone] (Work cell):  [phone] (Fax):           [phone]  I, Jacqualyn Posey, am acting as a scribe for Dr. Sullivan Lone.   .I have reviewed the above documentation for accuracy and completeness, and I agree with the above. [name] MD

## 2018-10-13 ENCOUNTER — Inpatient Hospital Stay: Payer: Medicare Other

## 2018-10-13 ENCOUNTER — Inpatient Hospital Stay (HOSPITAL_BASED_OUTPATIENT_CLINIC_OR_DEPARTMENT_OTHER): Payer: Medicare Other | Admitting: Hematology

## 2018-10-13 ENCOUNTER — Other Ambulatory Visit: Payer: Self-pay

## 2018-10-13 VITALS — BP 168/56 | HR 61 | Temp 99.1°F | Resp 17 | Ht 67.0 in | Wt 199.2 lb

## 2018-10-13 DIAGNOSIS — D693 Immune thrombocytopenic purpura: Secondary | ICD-10-CM

## 2018-10-13 DIAGNOSIS — I129 Hypertensive chronic kidney disease with stage 1 through stage 4 chronic kidney disease, or unspecified chronic kidney disease: Secondary | ICD-10-CM | POA: Diagnosis not present

## 2018-10-13 DIAGNOSIS — E538 Deficiency of other specified B group vitamins: Secondary | ICD-10-CM | POA: Diagnosis not present

## 2018-10-13 DIAGNOSIS — E1122 Type 2 diabetes mellitus with diabetic chronic kidney disease: Secondary | ICD-10-CM | POA: Diagnosis not present

## 2018-10-13 DIAGNOSIS — E119 Type 2 diabetes mellitus without complications: Secondary | ICD-10-CM

## 2018-10-13 DIAGNOSIS — E785 Hyperlipidemia, unspecified: Secondary | ICD-10-CM | POA: Diagnosis not present

## 2018-10-13 DIAGNOSIS — E1140 Type 2 diabetes mellitus with diabetic neuropathy, unspecified: Secondary | ICD-10-CM | POA: Diagnosis not present

## 2018-10-13 LAB — CBC WITH DIFFERENTIAL/PLATELET
Abs Immature Granulocytes: 0.05 10*3/uL (ref 0.00–0.07)
Basophils Absolute: 0 10*3/uL (ref 0.0–0.1)
Basophils Relative: 0 %
Eosinophils Absolute: 0.1 10*3/uL (ref 0.0–0.5)
Eosinophils Relative: 1 %
HCT: 41.6 % (ref 39.0–52.0)
Hemoglobin: 13.1 g/dL (ref 13.0–17.0)
Immature Granulocytes: 1 %
Lymphocytes Relative: 18 %
Lymphs Abs: 0.9 10*3/uL (ref 0.7–4.0)
MCH: 30.8 pg (ref 26.0–34.0)
MCHC: 31.5 g/dL (ref 30.0–36.0)
MCV: 97.7 fL (ref 80.0–100.0)
Monocytes Absolute: 0.5 10*3/uL (ref 0.1–1.0)
Monocytes Relative: 10 %
Neutro Abs: 3.6 10*3/uL (ref 1.7–7.7)
Neutrophils Relative %: 70 %
Platelets: 163 10*3/uL (ref 150–400)
RBC: 4.26 MIL/uL (ref 4.22–5.81)
RDW: 12.7 % (ref 11.5–15.5)
WBC: 5.2 10*3/uL (ref 4.0–10.5)
nRBC: 0 % (ref 0.0–0.2)

## 2018-10-13 LAB — HEMOGLOBIN A1C
Hgb A1c MFr Bld: 7.1 % — ABNORMAL HIGH (ref 4.8–5.6)
Mean Plasma Glucose: 157.07 mg/dL

## 2018-10-13 MED ORDER — ROMIPLOSTIM 250 MCG ~~LOC~~ SOLR
50.0000 ug | Freq: Once | SUBCUTANEOUS | Status: DC
  Filled 2018-10-13: qty 0.1

## 2018-10-14 ENCOUNTER — Telehealth: Payer: Self-pay | Admitting: Hematology

## 2018-10-14 NOTE — Telephone Encounter (Signed)
Per 8/18 los. Appts already scheduled.  Called and left a voice message of appt date and time,

## 2018-10-27 ENCOUNTER — Inpatient Hospital Stay: Payer: Medicare Other

## 2018-10-27 ENCOUNTER — Inpatient Hospital Stay: Payer: Medicare Other | Attending: Hematology

## 2018-11-09 ENCOUNTER — Telehealth: Payer: Self-pay | Admitting: Hematology

## 2018-11-09 NOTE — Telephone Encounter (Signed)
Returned patient's phone call regarding cancelling an appointment, informed patient I will give a call back once I get further instructions from the provider.

## 2018-11-10 ENCOUNTER — Inpatient Hospital Stay: Payer: Medicare Other

## 2018-11-11 ENCOUNTER — Inpatient Hospital Stay (HOSPITAL_COMMUNITY)
Admission: EM | Admit: 2018-11-11 | Discharge: 2018-11-17 | DRG: 291 | Disposition: A | Discharge status: Home Health | Payer: Medicare Other | Attending: Internal Medicine | Admitting: Internal Medicine

## 2018-11-11 ENCOUNTER — Emergency Department (HOSPITAL_COMMUNITY): Payer: Medicare Other

## 2018-11-11 ENCOUNTER — Other Ambulatory Visit: Payer: Self-pay

## 2018-11-11 DIAGNOSIS — R079 Chest pain, unspecified: Secondary | ICD-10-CM | POA: Diagnosis not present

## 2018-11-11 DIAGNOSIS — I509 Heart failure, unspecified: Secondary | ICD-10-CM | POA: Diagnosis not present

## 2018-11-11 DIAGNOSIS — N183 Chronic kidney disease, stage 3 (moderate): Secondary | ICD-10-CM | POA: Diagnosis present

## 2018-11-11 DIAGNOSIS — Z888 Allergy status to other drugs, medicaments and biological substances status: Secondary | ICD-10-CM

## 2018-11-11 DIAGNOSIS — I130 Hypertensive heart and chronic kidney disease with heart failure and stage 1 through stage 4 chronic kidney disease, or unspecified chronic kidney disease: Principal | ICD-10-CM | POA: Diagnosis present

## 2018-11-11 DIAGNOSIS — I2510 Atherosclerotic heart disease of native coronary artery without angina pectoris: Secondary | ICD-10-CM | POA: Diagnosis present

## 2018-11-11 DIAGNOSIS — E119 Type 2 diabetes mellitus without complications: Secondary | ICD-10-CM

## 2018-11-11 DIAGNOSIS — I447 Left bundle-branch block, unspecified: Secondary | ICD-10-CM | POA: Diagnosis present

## 2018-11-11 DIAGNOSIS — Z79899 Other long term (current) drug therapy: Secondary | ICD-10-CM

## 2018-11-11 DIAGNOSIS — D693 Immune thrombocytopenic purpura: Secondary | ICD-10-CM | POA: Diagnosis present

## 2018-11-11 DIAGNOSIS — I5033 Acute on chronic diastolic (congestive) heart failure: Secondary | ICD-10-CM | POA: Diagnosis present

## 2018-11-11 DIAGNOSIS — Z880 Allergy status to penicillin: Secondary | ICD-10-CM

## 2018-11-11 DIAGNOSIS — I10 Essential (primary) hypertension: Secondary | ICD-10-CM | POA: Diagnosis present

## 2018-11-11 DIAGNOSIS — Z66 Do not resuscitate: Secondary | ICD-10-CM | POA: Diagnosis not present

## 2018-11-11 DIAGNOSIS — Z20828 Contact with and (suspected) exposure to other viral communicable diseases: Secondary | ICD-10-CM | POA: Diagnosis not present

## 2018-11-11 DIAGNOSIS — E1140 Type 2 diabetes mellitus with diabetic neuropathy, unspecified: Secondary | ICD-10-CM | POA: Diagnosis present

## 2018-11-11 DIAGNOSIS — J9621 Acute and chronic respiratory failure with hypoxia: Secondary | ICD-10-CM | POA: Diagnosis present

## 2018-11-11 DIAGNOSIS — Z23 Encounter for immunization: Secondary | ICD-10-CM

## 2018-11-11 DIAGNOSIS — Z96641 Presence of right artificial hip joint: Secondary | ICD-10-CM | POA: Diagnosis present

## 2018-11-11 DIAGNOSIS — J9601 Acute respiratory failure with hypoxia: Secondary | ICD-10-CM | POA: Diagnosis not present

## 2018-11-11 DIAGNOSIS — Z951 Presence of aortocoronary bypass graft: Secondary | ICD-10-CM | POA: Diagnosis not present

## 2018-11-11 DIAGNOSIS — Z87891 Personal history of nicotine dependence: Secondary | ICD-10-CM

## 2018-11-11 DIAGNOSIS — E1122 Type 2 diabetes mellitus with diabetic chronic kidney disease: Secondary | ICD-10-CM | POA: Diagnosis present

## 2018-11-11 DIAGNOSIS — I110 Hypertensive heart disease with heart failure: Secondary | ICD-10-CM | POA: Diagnosis not present

## 2018-11-11 DIAGNOSIS — R0789 Other chest pain: Secondary | ICD-10-CM | POA: Diagnosis present

## 2018-11-11 DIAGNOSIS — E785 Hyperlipidemia, unspecified: Secondary | ICD-10-CM | POA: Diagnosis present

## 2018-11-11 DIAGNOSIS — N400 Enlarged prostate without lower urinary tract symptoms: Secondary | ICD-10-CM | POA: Diagnosis present

## 2018-11-11 LAB — CBC
HCT: 41.8 % (ref 39.0–52.0)
Hemoglobin: 13.2 g/dL (ref 13.0–17.0)
MCH: 30.9 pg (ref 26.0–34.0)
MCHC: 31.6 g/dL (ref 30.0–36.0)
MCV: 97.9 fL (ref 80.0–100.0)
Platelets: 130 10*3/uL — ABNORMAL LOW (ref 150–400)
RBC: 4.27 MIL/uL (ref 4.22–5.81)
RDW: 12.7 % (ref 11.5–15.5)
WBC: 5.2 10*3/uL (ref 4.0–10.5)
nRBC: 0 % (ref 0.0–0.2)

## 2018-11-11 LAB — BASIC METABOLIC PANEL
Anion gap: 9 (ref 5–15)
BUN: 30 mg/dL — ABNORMAL HIGH (ref 8–23)
CO2: 25 mmol/L (ref 22–32)
Calcium: 9 mg/dL (ref 8.9–10.3)
Chloride: 105 mmol/L (ref 98–111)
Creatinine, Ser: 1.69 mg/dL — ABNORMAL HIGH (ref 0.61–1.24)
GFR calc Af Amer: 39 mL/min — ABNORMAL LOW (ref >60)
GFR calc non Af Amer: 33 mL/min — ABNORMAL LOW (ref >60)
Glucose, Bld: 135 mg/dL — ABNORMAL HIGH (ref 70–99)
Potassium: 4.1 mmol/L (ref 3.5–5.1)
Sodium: 139 mmol/L (ref 135–145)

## 2018-11-11 LAB — URINALYSIS, ROUTINE W REFLEX MICROSCOPIC
Bilirubin Urine: NEGATIVE
Glucose, UA: NEGATIVE mg/dL
Hgb urine dipstick: NEGATIVE
Ketones, ur: NEGATIVE mg/dL
Leukocytes,Ua: NEGATIVE
Nitrite: NEGATIVE
Protein, ur: 100 mg/dL — AB
Specific Gravity, Urine: 1.018 (ref 1.005–1.030)
pH: 6 (ref 5.0–8.0)

## 2018-11-11 LAB — BRAIN NATRIURETIC PEPTIDE: B Natriuretic Peptide: 454.9 pg/mL — ABNORMAL HIGH (ref 0.0–100.0)

## 2018-11-11 LAB — SARS CORONAVIRUS 2 BY RT PCR (HOSPITAL ORDER, PERFORMED IN ~~LOC~~ HOSPITAL LAB): SARS Coronavirus 2: NEGATIVE

## 2018-11-11 LAB — TROPONIN I (HIGH SENSITIVITY)
Troponin I (High Sensitivity): 26 ng/L — ABNORMAL HIGH (ref <18)
Troponin I (High Sensitivity): 23 ng/L — ABNORMAL HIGH (ref <18)

## 2018-11-11 LAB — D-DIMER, QUANTITATIVE: D-Dimer, Quant: 3.63 ug/mL-FEU — ABNORMAL HIGH (ref 0.00–0.50)

## 2018-11-11 MED ORDER — ENOXAPARIN SODIUM 40 MG/0.4ML ~~LOC~~ SOLN: 40.0000 mg | SUBCUTANEOUS | Status: DC

## 2018-11-11 MED ORDER — LOSARTAN POTASSIUM 50 MG PO TABS
100.0000 mg | ORAL_TABLET | Freq: Every day | ORAL | Status: DC
  Administered 2018-11-12 – 2018-11-17 (×6): 100 mg via ORAL
  Filled 2018-11-11 (×6): qty 2

## 2018-11-11 MED ORDER — MAGNESIUM OXIDE 400 (241.3 MG) MG PO TABS
400.0000 mg | ORAL_TABLET | Freq: Every day | ORAL | Status: DC
  Administered 2018-11-12 – 2018-11-17 (×6): 400 mg via ORAL
  Filled 2018-11-11 (×6): qty 1

## 2018-11-11 MED ORDER — BRIMONIDINE TARTRATE 0.2 % OP SOLN
1.0000 [drp] | Freq: Three times a day (TID) | OPHTHALMIC | Status: DC
  Administered 2018-11-12 – 2018-11-17 (×16): 1 [drp] via OPHTHALMIC
  Filled 2018-11-11: qty 5

## 2018-11-11 MED ORDER — SIMVASTATIN 20 MG PO TABS
20.0000 mg | ORAL_TABLET | Freq: Every day | ORAL | Status: DC
  Administered 2018-11-12 – 2018-11-16 (×5): 20 mg via ORAL
  Filled 2018-11-11 (×5): qty 1

## 2018-11-11 MED ORDER — ACETAMINOPHEN 325 MG PO TABS: 650.0000 mg | ORAL_TABLET | ORAL | Status: DC | PRN

## 2018-11-11 MED ORDER — HYDRALAZINE HCL 20 MG/ML IJ SOLN
10.0000 mg | INTRAMUSCULAR | Status: DC | PRN
  Administered 2018-11-14: 10 mg via INTRAVENOUS
  Filled 2018-11-11: qty 1

## 2018-11-11 MED ORDER — SODIUM CHLORIDE 0.9 % IV SOLN: 250.0000 mL | INTRAVENOUS | Status: DC | PRN

## 2018-11-11 MED ORDER — POLYVINYL ALCOHOL 1.4 % OP SOLN
Freq: Four times a day (QID) | OPHTHALMIC | Status: DC
  Administered 2018-11-12 (×3): via OPHTHALMIC
  Administered 2018-11-12: 14:00:00 1 [drp] via OPHTHALMIC
  Administered 2018-11-13: 18:00:00 via OPHTHALMIC
  Administered 2018-11-13 (×2): 1 [drp] via OPHTHALMIC
  Administered 2018-11-13: 16:00:00 via OPHTHALMIC
  Administered 2018-11-14 (×4): 1 [drp] via OPHTHALMIC
  Administered 2018-11-15 – 2018-11-17 (×9): via OPHTHALMIC
  Filled 2018-11-11: qty 15

## 2018-11-11 MED ORDER — POLYVINYL ALCOHOL 1.4 % OP SOLN: 1.0000 [drp] | Freq: Three times a day (TID) | OPHTHALMIC | Status: DC | PRN

## 2018-11-11 MED ORDER — CYCLOSPORINE 0.05 % OP EMUL
1.0000 [drp] | Freq: Two times a day (BID) | OPHTHALMIC | Status: DC
  Administered 2018-11-12 – 2018-11-17 (×11): 1 [drp] via OPHTHALMIC
  Filled 2018-11-11 (×13): qty 30

## 2018-11-11 MED ORDER — SODIUM CHLORIDE 0.9% FLUSH
3.0000 mL | Freq: Two times a day (BID) | INTRAVENOUS | Status: DC
  Administered 2018-11-12 – 2018-11-16 (×8): 3 mL via INTRAVENOUS

## 2018-11-11 MED ORDER — SODIUM CHLORIDE 0.9% FLUSH: 3.0000 mL | INTRAVENOUS | Status: DC | PRN

## 2018-11-11 MED ORDER — SODIUM CHLORIDE 0.9% FLUSH
3.0000 mL | Freq: Once | INTRAVENOUS | Status: AC
  Administered 2018-11-11: 3 mL via INTRAVENOUS

## 2018-11-11 MED ORDER — ADULT MULTIVITAMIN W/MINERALS CH
1.0000 | ORAL_TABLET | Freq: Every day | ORAL | Status: DC
  Administered 2018-11-12 – 2018-11-17 (×6): 1 via ORAL
  Filled 2018-11-11 (×6): qty 1

## 2018-11-11 MED ORDER — HYDRALAZINE HCL 20 MG/ML IJ SOLN: 10.0000 mg | INTRAMUSCULAR | Status: DC | PRN

## 2018-11-11 MED ORDER — FUROSEMIDE 10 MG/ML IJ SOLN
40.0000 mg | Freq: Every day | INTRAMUSCULAR | Status: DC
  Administered 2018-11-12: 40 mg via INTRAVENOUS
  Filled 2018-11-11: qty 4

## 2018-11-11 MED ORDER — ONDANSETRON HCL 4 MG/2ML IJ SOLN: 4.0000 mg | Freq: Four times a day (QID) | INTRAMUSCULAR | Status: DC | PRN

## 2018-11-11 MED ORDER — FUROSEMIDE 10 MG/ML IJ SOLN
40.0000 mg | Freq: Once | INTRAMUSCULAR | Status: AC
  Administered 2018-11-11: 40 mg via INTRAVENOUS
  Filled 2018-11-11: qty 4

## 2018-11-11 NOTE — ED Triage Notes (Signed)
Patient reports he has been having chest pain and SoB on the left side x2 days. Patient reports it is running from the top part of his shoulder down his ribs.

## 2018-11-11 NOTE — ED Provider Notes (Signed)
Medical screening examination/treatment/procedure(s) were conducted as a shared visit with non-physician practitioner(s) and myself.  I personally evaluated the patient during the encounter.  EKG Interpretation  Date/Time:  Wednesday November 11 2018 17:08:43 EDT Ventricular Rate:  63 PR Interval:    QRS Duration: 163 QT Interval:  462 QTC Calculation: 473 R Axis:   -58 Text Interpretation:  Sinus rhythm Multiple ventricular premature complexes , new Prolonged PR interval Left bundle branch block No significant change since last tracing Confirmed by Dorie Rank [phone]) on 11/11/2018 5:13:13 PM  Patient presents emergency room with complaints of shortness of breath.  On my exam he does have some bilateral crackles.  ?PNA , CHF? Tachypnea noted but he is otherwise stable on nasal cannula oxygen.  We will proceed with evaluation including laboratory tests & chest x-ray   Dorie Rank, MD 11/11/18 [phone]

## 2018-11-11 NOTE — ED Provider Notes (Signed)
[facility] DEPT Provider Note   CSN: [id number] Arrival date & time: 11/11/18  1622     History   Chief Complaint Chief Complaint  Patient presents with  . Shortness of Breath    HPI Lawrence Warner is a 85 y.o. male with history of CKD, CAD, type 2 diabetes, hyperlipidemia, hypertension presents for evaluation of acute onset, progressively worsening left-sided chest pains and shortness of breath for 2 days.  He reports pain is sharp, pleuritic, radiates from his left side of his chest down his abdomen.  He denies any fevers, nausea, vomiting, urinary symptoms, diarrhea, or constipation.  He does note orthopnea.  Denies leg swelling, recent travel or surgeries, hemoptysis, prior history of DVT or PE.  He is not on hormone replacement therapy.  He has not tried anything for his symptoms.  He is a former smoker and quit several decades ago.  On triage he was found to be hypoxic at 88% on room air, placed on 2 L via nasal cannula with improvement.  The patient states he is DNR/DNI.      The history is provided by the patient.    Past Medical History:  Diagnosis Date  . BPH (benign prostatic hypertrophy)   . Chronic heart failure ([place])   . Chronic renal insufficiency   . Coronary artery disease    CABG in 2000  . Diabetes type 2, controlled ([place])    Diet-controlled  . Diabetic neuropathy ([place])   . Dyslipidemia   . Hypertension   . ITP (idiopathic thrombocytopenic purpura)   . Osteoarthritis   . Pneumonia     Patient Active Problem List   Diagnosis Date Noted  . Coronary artery disease involving native coronary artery of native heart without angina pectoris 01/08/2018  . Generalized weakness 05/17/2017  . Hypertensive emergency 05/17/2017  . RSV bronchitis   . Bronchospasm with bronchitis, acute 03/14/2017  . Acute on chronic diastolic CHF (congestive heart failure) ([place]) 11/26/2016  . CRI (chronic renal insufficiency), stage 3 (moderate)  ([place]) 11/26/2016  . LBBB (left bundle branch block) 11/26/2016  . Hypoxia   . Acute respiratory failure with hypoxia ([place]) 07/20/2016  . Chronic respiratory failure with hypoxia ([place]) 02/28/2016  . Dyslipidemia 02/28/2016  . Constipation 12/07/2015  . Bradycardia 11/28/2015  . Port catheter in place 10/24/2015  . GERD (gastroesophageal reflux disease) 10/09/2015  . Gait instability 10/09/2015  . Chest pain 10/05/2015  . Diet-controlled diabetes mellitus ([place]) 05/07/2015  . Hx of CABG 05/07/2015  . Essential hypertension 05/07/2015  . Muscle cramps 04/25/2015  . Shortness of breath 04/11/2015  . Idiopathic thrombocytopenic purpura ([place]) 09/27/2014    Past Surgical History:  Procedure Laterality Date  . CHOLECYSTECTOMY    . CORONARY ARTERY BYPASS GRAFT  2000  . Right hip replacement  2014  . TONSILLECTOMY          Home Medications    Prior to Admission medications   Medication Sig Start Date End Date Taking? Authorizing Provider  acetaminophen (TYLENOL) 500 MG tablet Take 1,500 mg by mouth daily.    Yes [provider]  brimonidine (ALPHAGAN) 0.2 % ophthalmic solution Place 1 drop into both eyes 3 (three) times daily.   Yes [provider]  cycloSPORINE (RESTASIS) 0.05 % ophthalmic emulsion Place 1 drop into both eyes every 12 (twelve) hours.    Yes [provider]  Dextran 70-Hypromellose (ARTIFICIAL TEARS PF OP) Place 1 drop into both eyes 4 (four) times daily.  Yes [provider]  hydroxypropyl methylcellulose / hypromellose (ISOPTO TEARS / GONIOVISC) 2.5 % ophthalmic solution Place 1 drop into both eyes 3 (three) times daily as needed for dry eyes.   Yes [provider]  losartan (COZAAR) 100 MG tablet Take 100 mg by mouth daily.   Yes [provider]  magnesium oxide (MAG-OX) 400 MG tablet Take 400 mg daily by mouth.   Yes [provider]  Multiple Vitamin (MULTIVITAMIN WITH MINERALS) TABS tablet Take 1  tablet by mouth daily.   Yes [provider]  simvastatin (ZOCOR) 20 MG tablet Take 20 mg by mouth daily.   Yes [provider]    Family History Family History  Problem Relation Age of Onset  . Lung disease Father   . Cancer Brother     Social History Social History   Tobacco Use  . Smoking status: Former Smoker    Packs/day: 2.00    Years: 25.00    Pack years: 50.00  . Smokeless tobacco: Never Used  Substance Use Topics  . Alcohol use: No  . Drug use: No     Allergies   Alfuzosin hcl er, Imdur [isosorbide dinitrate], Penicillins, and Tape   Review of Systems Review of Systems  Constitutional: Negative for chills and fever.  Respiratory: Positive for cough (chronic, unchanged) and shortness of breath.   Cardiovascular: Positive for chest pain. Negative for leg swelling.  Gastrointestinal: Negative for abdominal pain, constipation, diarrhea, nausea and vomiting.  Genitourinary: Negative for dysuria, frequency, hematuria and urgency.  All other systems reviewed and are negative.    Physical Exam Updated Vital Signs BP (!) 178/94 (BP Location: Left Arm)   Pulse 86   Temp 98.7 F (37.1 C) (Oral)   Resp 20   Ht 5\' 9"  (1.753 m)   Wt 88 kg   SpO2 98%   BMI 28.65 kg/m   Physical Exam Vitals signs and nursing note reviewed.  Constitutional:      General: He is not in acute distress.    Appearance: He is well-developed.  HENT:     Head: Normocephalic and atraumatic.  Eyes:     General:        Right eye: No discharge.        Left eye: No discharge.     Conjunctiva/sclera: Conjunctivae normal.  Neck:     Vascular: No JVD.     Trachea: No tracheal deviation.  Cardiovascular:     Rate and Rhythm: Normal rate and regular rhythm.     Comments: 2+ radial pulses bilaterally, 1+ DP/PT pulses bilaterally, no lower extremity edema, Homans sign absent bilaterally, compartments are soft Pulmonary:     Effort: Tachypnea present.     Comments:  Globally diminished breath sounds, speaking in short sentences.  SPO2 saturations 98% on 2 L via nasal cannula Chest:     Chest wall: No tenderness.  Abdominal:     General: Bowel sounds are normal. There is no distension.     Palpations: Abdomen is soft.     Tenderness: There is no abdominal tenderness. There is no guarding or rebound.  Musculoskeletal:     Right lower leg: He exhibits no tenderness. No edema.     Left lower leg: He exhibits no tenderness. No edema.  Skin:    General: Skin is warm and dry.     Findings: No erythema.  Neurological:     Mental Status: He is alert.  Psychiatric:  Behavior: Behavior normal.      ED Treatments / Results  Labs (all labs ordered are listed, but only abnormal results are displayed) Labs Reviewed  BASIC METABOLIC PANEL - Abnormal; Notable for the following components:      Result Value   Glucose, Bld 135 (*)    BUN 30 (*)    Creatinine, Ser 1.69 (*)    GFR calc non Af Amer 33 (*)    GFR calc Af Amer 39 (*)    All other components within normal limits  CBC - Abnormal; Notable for the following components:   Platelets 130 (*)    All other components within normal limits  URINALYSIS, ROUTINE W REFLEX MICROSCOPIC - Abnormal; Notable for the following components:   Protein, ur 100 (*)    Bacteria, UA RARE (*)    All other components within normal limits  BRAIN NATRIURETIC PEPTIDE - Abnormal; Notable for the following components:   B Natriuretic Peptide 454.9 (*)    All other components within normal limits  D-DIMER, QUANTITATIVE (NOT AT [facility]) - Abnormal; Notable for the following components:   D-Dimer, Quant 3.63 (*)    All other components within normal limits  TROPONIN I (HIGH SENSITIVITY) - Abnormal; Notable for the following components:   Troponin I (High Sensitivity) 26 (*)    All other components within normal limits  TROPONIN I (HIGH SENSITIVITY) - Abnormal; Notable for the following components:   Troponin I (High  Sensitivity) 23 (*)    All other components within normal limits  SARS CORONAVIRUS 2 (HOSPITAL ORDER, [facility])  BASIC METABOLIC PANEL    EKG EKG Interpretation  Date/Time:  Wednesday November 11 2018 17:08:43 EDT Ventricular Rate:  63 PR Interval:    QRS Duration: 163 QT Interval:  462 QTC Calculation: 473 R Axis:   -58 Text Interpretation:  Sinus rhythm Multiple ventricular premature complexes , new Prolonged PR interval Left bundle branch block No significant change since last tracing Confirmed by Dorie Rank [id number]) on 11/11/2018 5:13:13 PM   Radiology Dg Chest 2 View  Result Date: 11/11/2018 CLINICAL DATA:  85 year old male with chest pain and shortness of breath on the left for the past 2 days. EXAM: CHEST - 2 VIEW COMPARISON:  Prior chest x-ray 02/06/2018 FINDINGS: Stable mild cardiomegaly. Patient is status post median sternotomy with evidence of prior multivessel CABG. Slight interval increase in pulmonary vascular congestion now bordering on mild interstitial edema. Small left pleural effusion. No pneumothorax. No focal airspace infiltrate. No acute osseous abnormality. Band 2 spine suggests ankylosing spondylosis. IMPRESSION: 1. Mild CHF. 2. Small left pleural effusion. 3. Ankylosing spondylosis. Electronically Signed   By: Jacqulynn Cadet M.D.   On: 11/11/2018 19:28    Procedures .Critical Care Performed by: Renita Papa, PA-C Authorized by: Renita Papa, PA-C   Critical care provider statement:    Critical care time (minutes):  40   Critical care was necessary to treat or prevent imminent or life-threatening deterioration of the following conditions:  Respiratory failure   Critical care was time spent personally by me on the following activities:  Discussions with consultants, evaluation of patient's response to treatment, examination of patient, ordering and performing treatments and interventions, ordering and review of laboratory  studies, ordering and review of radiographic studies, pulse oximetry, re-evaluation of patient's condition, obtaining history from patient or surrogate and review of old charts   (including critical care time)  Medications Ordered in ED Medications  sodium  chloride flush (NS) 0.9 % injection 3 mL (0 mLs Intravenous Hold 11/11/18 2156)  sodium chloride flush (NS) 0.9 % injection 3 mL (has no administration in time range)  0.9 %  sodium chloride infusion (has no administration in time range)  acetaminophen (TYLENOL) tablet 650 mg (has no administration in time range)  ondansetron (ZOFRAN) injection 4 mg (has no administration in time range)  enoxaparin (LOVENOX) injection 40 mg (40 mg Subcutaneous Not Given 11/12/18 0013)  furosemide (LASIX) injection 40 mg (has no administration in time range)  hydrALAZINE (APRESOLINE) injection 10-20 mg (has no administration in time range)  losartan (COZAAR) tablet 100 mg (has no administration in time range)  brimonidine (ALPHAGAN) 0.2 % ophthalmic solution 1 drop (1 drop Both Eyes Not Given 11/12/18 0012)  cycloSPORINE (RESTASIS) 0.05 % ophthalmic emulsion 1 drop (1 drop Both Eyes Not Given 11/12/18 0012)  polyvinyl alcohol (LIQUIFILM TEARS) 1.4 % ophthalmic solution ( Both Eyes Not Given 11/12/18 0013)  polyvinyl alcohol (LIQUIFILM TEARS) 1.4 % ophthalmic solution 1 drop (has no administration in time range)  simvastatin (ZOCOR) tablet 20 mg (has no administration in time range)  multivitamin with minerals tablet 1 tablet (has no administration in time range)  magnesium oxide (MAG-OX) tablet 400 mg (has no administration in time range)  sodium chloride flush (NS) 0.9 % injection 3 mL (3 mLs Intravenous Given 11/11/18 1823)  furosemide (LASIX) injection 40 mg (40 mg Intravenous Given 11/11/18 1955)     Initial Impression / Assessment and Plan / ED Course  I have reviewed the triage vital signs and the nursing notes.  Pertinent labs & imaging results that  were available during my care of the patient were reviewed by me and considered in my medical decision making (see chart for details).       Lawrence Warner was evaluated in Emergency Department on 11/12/2018 for the symptoms described in the history of present illness. He was evaluated in the context of the global COVID-19 pandemic, which necessitated consideration that the patient might be at risk for infection with the SARS-CoV-2 virus that causes COVID-19. Institutional protocols and algorithms that pertain to the evaluation of patients at risk for COVID-19 are in a state of rapid change based on information released by regulatory bodies including the CDC and federal and state organizations. These policies and algorithms were followed during the patient's care in the ED.  Patient presenting for evaluation of 2-day history of shortness of breath and chest pain.  He is afebrile, hypoxic on room air with improvement on 2 L supplemental oxygen via nasal cannula.  He is tachypneic, otherwise resting comfortably in no apparent distress.  Chest pain is not pleuritic, exertional, or reproducible on palpation and his abdomen is soft and nontender.  EKG shows normal sinus rhythm with multiple PVCs, no acute ischemic abnormalities.  Chest x-ray consistent with CHF, also noted small left pleural effusion.  Lab work reviewed by me shows no leukocytosis, no anemia, elevated BUN and creatinine around patient's baseline.  His BNP is elevated.  His initial troponin is mildly elevated but second troponin is stable, likely elevated in the setting of CHF exacerbation. Doubt MI. No evidence of pneumonia. He does not appear to be septic at this time. His COVID test is negative.  He was given a dose of IV Lasix in the ED with good urine output.  Suspect that he is hypoxic in the setting of a CHF exacerbation.  Will require admission for this given new oxygen requirement.  Spoke with Dr. Alcario Drought with [facility] hospitalist service  who agrees to assume care of patient and bring him into the hospital for further evaluation and management.  Patient was seen and evaluated by Dr. Tomi Bamberger who agrees with assessment and plan at this time.  Final Clinical Impressions(s) / ED Diagnoses   Final diagnoses:  Acute respiratory failure with hypoxia ([place])  Acute on chronic congestive heart failure, unspecified heart failure type [facility])    ED Discharge Orders    None       Debroah Baller 11/12/18 0143    Dorie Rank, MD 11/12/18 2027

## 2018-11-11 NOTE — ED Notes (Signed)
Updated given to daughter-in-law Kallie Locks [phone]

## 2018-11-11 NOTE — H&P (Signed)
History and Physical    Lawrence Warner VWU:[id number] DOB: 1918/07/15 DOA: 11/11/2018  PCP: Clovia Cuff, MD  Patient coming from: Home  I have personally briefly reviewed patient's old medical records in [place]  Chief Complaint: SOB, CP  HPI: Lawrence Warner is a 85 y.o. male with medical history significant of CKD stage 3, DM2, HTN, CAD s/p CABG, diastolic CHF.  Patient presents to the ED with c/o acute onset, progressively worsening L sided CP and SOB for past 2 days.  CP is worse with deep breaths, sharp in quality, L side of chest and radiates down into abdomen.  Associated orthopnea with SOB, no peripheral edema, no recent travel nor surgeries.  Satting 88% on RA, improved to 98% on 2L via [place].  No fever, nausea, vomiting, dysuria, diarrhea.   ED Course: Trops were 26 and 23.  BNP 454.9.  EKG shows LBBB with trigeminy (unchanged from priors).  CXR shows mild CHF findings (pulm edema, pulm vas congestion).  WBC nl, no fever, BP running high (295A-213Y systolic).  BUN 30 and creat 1.69 (1.5 baseline).  Given 40mg  IV lasix and hospitalist asked to admit.   Review of Systems: As per HPI, otherwise all review of systems negative.  Past Medical History:  Diagnosis Date  . BPH (benign prostatic hypertrophy)   . Chronic heart failure ([place])   . Chronic renal insufficiency   . Coronary artery disease    CABG in 2000  . Diabetes type 2, controlled ([place])    Diet-controlled  . Diabetic neuropathy ([place])   . Dyslipidemia   . Hypertension   . ITP (idiopathic thrombocytopenic purpura)   . Osteoarthritis   . Pneumonia     Past Surgical History:  Procedure Laterality Date  . CHOLECYSTECTOMY    . CORONARY ARTERY BYPASS GRAFT  2000  . Right hip replacement  2014  . TONSILLECTOMY       reports that he has quit smoking. He has a 50.00 pack-year smoking history. He has never used smokeless tobacco. He reports that he does not drink alcohol or use drugs.   Allergies  Allergen Reactions  . Alfuzosin Hcl Er Other (See Comments)    Confusion/Uroxatral   . Imdur [Isosorbide Dinitrate] Other (See Comments)    REACTION NOT RECALLED  . Penicillins Hives and Other (See Comments)    Has patient had a PCN reaction causing immediate rash, facial/tongue/throat swelling, SOB or lightheadedness with hypotension: Yes Has patient had a PCN reaction causing severe rash involving mucus membranes or skin necrosis: Unknown Has patient had a PCN reaction that required hospitalization: Unknown Has patient had a PCN reaction occurring within the last 10 years: Unknown If all of the above answers are "NO", then may proceed with Cephalosporin use.   . Tape Other (See Comments)    SKIN IS VERY THIN AND WILL TEAR EASILY    Family History  Problem Relation Age of Onset  . Lung disease Father   . Cancer Brother      Prior to Admission medications   Medication Sig Start Date End Date Taking? Authorizing Provider  acetaminophen (TYLENOL) 500 MG tablet Take 1,500 mg by mouth daily.    Yes [provider]  brimonidine (ALPHAGAN) 0.2 % ophthalmic solution Place 1 drop into both eyes 3 (three) times daily.   Yes [provider]  cycloSPORINE (RESTASIS) 0.05 % ophthalmic emulsion Place 1 drop into both eyes every 12 (twelve) hours.    Yes [provider]  Dextran  70-Hypromellose (ARTIFICIAL TEARS PF OP) Place 1 drop into both eyes 4 (four) times daily.    Yes [provider]  hydroxypropyl methylcellulose / hypromellose (ISOPTO TEARS / GONIOVISC) 2.5 % ophthalmic solution Place 1 drop into both eyes 3 (three) times daily as needed for dry eyes.   Yes [provider]  losartan (COZAAR) 100 MG tablet Take 100 mg by mouth daily.   Yes [provider]  magnesium oxide (MAG-OX) 400 MG tablet Take 400 mg daily by mouth.   Yes [provider]  Multiple Vitamin (MULTIVITAMIN WITH MINERALS) TABS tablet Take 1 tablet  by mouth daily.   Yes [provider]  simvastatin (ZOCOR) 20 MG tablet Take 20 mg by mouth daily.   Yes [provider]    Physical Exam: Vitals:   11/11/18 2000 11/11/18 2030 11/11/18 2100 11/11/18 2130  BP: (!) 171/79 (!) 175/89 (!) 153/81 (!) 158/83  Pulse: 80 90 96 84  Resp: (!) 30 (!) 24 (!) 24 (!) 28  Temp:      TempSrc:      SpO2: 92% 94% 94% 93%    Constitutional: NAD, calm, comfortable Eyes: PERRL, lids and conjunctivae normal ENMT: Mucous membranes are moist. Posterior pharynx clear of any exudate or lesions.Normal dentition.  Neck: normal, supple, no masses, no thyromegaly Respiratory: Diminished BS Cardiovascular: Regular rate and rhythm, no murmurs / rubs / gallops. No extremity edema. 2+ pedal pulses. No carotid bruits.  Abdomen: no tenderness, no masses palpated. No hepatosplenomegaly. Bowel sounds positive.  Musculoskeletal: no clubbing / cyanosis. No joint deformity upper and lower extremities. Good ROM, no contractures. Normal muscle tone.  Skin: no rashes, lesions, ulcers. No induration Neurologic: CN 2-12 grossly intact. Sensation intact, DTR normal. Strength 5/5 in all 4.  Psychiatric: Normal judgment and insight. Alert and oriented x 3. Normal mood.    Labs on Admission: I have personally reviewed following labs and imaging studies  CBC: Recent Labs  Lab 11/11/18 1821  WBC 5.2  HGB 13.2  HCT 41.8  MCV 97.9  PLT 462*   Basic Metabolic Panel: Recent Labs  Lab 11/11/18 1821  NA 139  K 4.1  CL 105  CO2 25  GLUCOSE 135*  BUN 30*  CREATININE 1.69*  CALCIUM 9.0   GFR: CrCl cannot be calculated (Unknown ideal weight.). Liver Function Tests: No results for input(s): AST, ALT, ALKPHOS, BILITOT, PROT, ALBUMIN in the last 168 hours. No results for input(s): LIPASE, AMYLASE in the last 168 hours. No results for input(s): AMMONIA in the last 168 hours. Coagulation Profile: No results for input(s): INR, PROTIME in the last 168  hours. Cardiac Enzymes: No results for input(s): CKTOTAL, CKMB, CKMBINDEX, TROPONINI in the last 168 hours. BNP (last 3 results) No results for input(s): PROBNP in the last 8760 hours. HbA1C: No results for input(s): HGBA1C in the last 72 hours. CBG: No results for input(s): GLUCAP in the last 168 hours. Lipid Profile: No results for input(s): CHOL, HDL, LDLCALC, TRIG, CHOLHDL, LDLDIRECT in the last 72 hours. Thyroid Function Tests: No results for input(s): TSH, T4TOTAL, FREET4, T3FREE, THYROIDAB in the last 72 hours. Anemia Panel: No results for input(s): VITAMINB12, FOLATE, FERRITIN, TIBC, IRON, RETICCTPCT in the last 72 hours. Urine analysis:    Component Value Date/Time   COLORURINE YELLOW 11/11/2018 1747   APPEARANCEUR CLEAR 11/11/2018 1747   LABSPEC 1.018 11/11/2018 1747   PHURINE 6.0 11/11/2018 [place] 11/11/2018 1747   HGBUR NEGATIVE 11/11/2018 1747  BILIRUBINUR NEGATIVE 11/11/2018 1747   KETONESUR NEGATIVE 11/11/2018 1747   PROTEINUR 100 (A) 11/11/2018 1747   NITRITE NEGATIVE 11/11/2018 1747   LEUKOCYTESUR NEGATIVE 11/11/2018 1747    Radiological Exams on Admission: Dg Chest 2 View  Result Date: 11/11/2018 CLINICAL DATA:  85 year old male with chest pain and shortness of breath on the left for the past 2 days. EXAM: CHEST - 2 VIEW COMPARISON:  Prior chest x-ray 02/06/2018 FINDINGS: Stable mild cardiomegaly. Patient is status post median sternotomy with evidence of prior multivessel CABG. Slight interval increase in pulmonary vascular congestion now bordering on mild interstitial edema. Small left pleural effusion. No pneumothorax. No focal airspace infiltrate. No acute osseous abnormality. Band 2 spine suggests ankylosing spondylosis. IMPRESSION: 1. Mild CHF. 2. Small left pleural effusion. 3. Ankylosing spondylosis. Electronically Signed   By: Jacqulynn Cadet M.D.   On: 11/11/2018 19:28    EKG: Independently reviewed.  Assessment/Plan Principal  Problem:   Acute on chronic diastolic CHF (congestive heart failure) (HCC) Active Problems:   Diet-controlled diabetes mellitus (HCC)   Hx of CABG   Essential hypertension   Acute respiratory failure with hypoxia ([place])    1. Acute on chronic diastolic CHF - 1. CHF pathway 2. Lasix 40mg  IV x1 now then 40mg  IV daily 3. Strict intake and output 4. BMP daily 5. 2d echo 6. Tele monitor 2. Acute respiratory failure with hypoxia - 1. Cont pulse ox 2. O2 via [place] 3. Pleuritic CP - 1. Checking D.Dimer due to pleuritic CP 2. But could just be due to WOB with CHF and acute resp failure 4. DM2 - 1. Diet controlled 5. HTN - 1. Continue Losartan daily 2. PRN hydralazine for SBP > 160 or DBP > 100, want these slightly lower BP goals due to acute CHF symptoms  DVT prophylaxis: Lovenox Code Status: DNR/DNI per patient Family Communication: No family in room Disposition Plan: Home after admit Consults called: None Admission status: Place in 62     Tisheena Maguire, [place] [facility]  How to contact the [facility] Attending or Consulting provider [place] or covering provider during after hours [place], for this patient?  1. Check the care team in [facility] and look for a) attending/consulting [facility] provider listed and b) the [facility] team listed 2. Log into [email or web address]  Amion Physician Scheduling and messaging for groups and whole hospitals  On call and physician scheduling software for group practices, residents, hospitalists and other medical providers for call, clinic, rotation and shift schedules. On[employer] is a hospital-wide system for scheduling doctors and paging doctors on call. EasyPlot is for scientific plotting and data analysis.  [email or web address]  and use [place]'s universal password to access. If you do not have the password, please contact the hospital operator.  3. Locate the [facility] provider you are looking for under [facility] and page to a number that you can be directly  reached. 4. If you still have difficulty reaching the provider, please page the [facility] (Director on Call) for the Hospitalists listed on amion for assistance.  11/11/2018, 10:03 PM

## 2018-11-12 ENCOUNTER — Observation Stay (HOSPITAL_COMMUNITY): Payer: Medicare Other

## 2018-11-12 ENCOUNTER — Observation Stay (HOSPITAL_BASED_OUTPATIENT_CLINIC_OR_DEPARTMENT_OTHER): Payer: Medicare Other

## 2018-11-12 DIAGNOSIS — Z880 Allergy status to penicillin: Secondary | ICD-10-CM | POA: Diagnosis not present

## 2018-11-12 DIAGNOSIS — E1122 Type 2 diabetes mellitus with diabetic chronic kidney disease: Secondary | ICD-10-CM | POA: Diagnosis present

## 2018-11-12 DIAGNOSIS — Z23 Encounter for immunization: Secondary | ICD-10-CM | POA: Diagnosis not present

## 2018-11-12 DIAGNOSIS — N400 Enlarged prostate without lower urinary tract symptoms: Secondary | ICD-10-CM | POA: Diagnosis present

## 2018-11-12 DIAGNOSIS — Z66 Do not resuscitate: Secondary | ICD-10-CM | POA: Diagnosis present

## 2018-11-12 DIAGNOSIS — J9601 Acute respiratory failure with hypoxia: Secondary | ICD-10-CM | POA: Diagnosis present

## 2018-11-12 DIAGNOSIS — Z888 Allergy status to other drugs, medicaments and biological substances status: Secondary | ICD-10-CM | POA: Diagnosis not present

## 2018-11-12 DIAGNOSIS — I5033 Acute on chronic diastolic (congestive) heart failure: Secondary | ICD-10-CM

## 2018-11-12 DIAGNOSIS — R0602 Shortness of breath: Secondary | ICD-10-CM | POA: Diagnosis not present

## 2018-11-12 DIAGNOSIS — E119 Type 2 diabetes mellitus without complications: Secondary | ICD-10-CM | POA: Diagnosis not present

## 2018-11-12 DIAGNOSIS — R0789 Other chest pain: Secondary | ICD-10-CM | POA: Diagnosis present

## 2018-11-12 DIAGNOSIS — E1140 Type 2 diabetes mellitus with diabetic neuropathy, unspecified: Secondary | ICD-10-CM | POA: Diagnosis present

## 2018-11-12 DIAGNOSIS — Z20828 Contact with and (suspected) exposure to other viral communicable diseases: Secondary | ICD-10-CM | POA: Diagnosis present

## 2018-11-12 DIAGNOSIS — Z79899 Other long term (current) drug therapy: Secondary | ICD-10-CM | POA: Diagnosis not present

## 2018-11-12 DIAGNOSIS — D693 Immune thrombocytopenic purpura: Secondary | ICD-10-CM | POA: Diagnosis present

## 2018-11-12 DIAGNOSIS — I130 Hypertensive heart and chronic kidney disease with heart failure and stage 1 through stage 4 chronic kidney disease, or unspecified chronic kidney disease: Secondary | ICD-10-CM | POA: Diagnosis present

## 2018-11-12 DIAGNOSIS — I509 Heart failure, unspecified: Secondary | ICD-10-CM

## 2018-11-12 DIAGNOSIS — I447 Left bundle-branch block, unspecified: Secondary | ICD-10-CM | POA: Diagnosis present

## 2018-11-12 DIAGNOSIS — E785 Hyperlipidemia, unspecified: Secondary | ICD-10-CM | POA: Diagnosis present

## 2018-11-12 DIAGNOSIS — Z87891 Personal history of nicotine dependence: Secondary | ICD-10-CM | POA: Diagnosis not present

## 2018-11-12 DIAGNOSIS — I10 Essential (primary) hypertension: Secondary | ICD-10-CM | POA: Diagnosis not present

## 2018-11-12 DIAGNOSIS — I2510 Atherosclerotic heart disease of native coronary artery without angina pectoris: Secondary | ICD-10-CM | POA: Diagnosis present

## 2018-11-12 DIAGNOSIS — Z96641 Presence of right artificial hip joint: Secondary | ICD-10-CM | POA: Diagnosis present

## 2018-11-12 DIAGNOSIS — N183 Chronic kidney disease, stage 3 (moderate): Secondary | ICD-10-CM | POA: Diagnosis present

## 2018-11-12 DIAGNOSIS — Z951 Presence of aortocoronary bypass graft: Secondary | ICD-10-CM | POA: Diagnosis not present

## 2018-11-12 LAB — ECHOCARDIOGRAM COMPLETE
Height: 69 in
Weight: 3081.6 oz

## 2018-11-12 LAB — BASIC METABOLIC PANEL
Anion gap: 7 (ref 5–15)
BUN: 31 mg/dL — ABNORMAL HIGH (ref 8–23)
CO2: 23 mmol/L (ref 22–32)
Calcium: 8.8 mg/dL — ABNORMAL LOW (ref 8.9–10.3)
Chloride: 108 mmol/L (ref 98–111)
Creatinine, Ser: 1.61 mg/dL — ABNORMAL HIGH (ref 0.61–1.24)
GFR calc Af Amer: 41 mL/min — ABNORMAL LOW (ref >60)
GFR calc non Af Amer: 35 mL/min — ABNORMAL LOW (ref >60)
Glucose, Bld: 116 mg/dL — ABNORMAL HIGH (ref 70–99)
Potassium: 4 mmol/L (ref 3.5–5.1)
Sodium: 138 mmol/L (ref 135–145)

## 2018-11-12 MED ORDER — PERFLUTREN LIPID MICROSPHERE
1.0000 mL | INTRAVENOUS | Status: AC | PRN
  Administered 2018-11-12: 3 mL via INTRAVENOUS
  Filled 2018-11-12: qty 10

## 2018-11-12 MED ORDER — TECHNETIUM TC 99M DIETHYLENETRIAME-PENTAACETIC ACID
31.0000 | Freq: Once | INTRAVENOUS | Status: AC | PRN
  Administered 2018-11-12: 15:00:00 31 via RESPIRATORY_TRACT

## 2018-11-12 MED ORDER — TECHNETIUM TO 99M ALBUMIN AGGREGATED
1.6000 | Freq: Once | INTRAVENOUS | Status: AC | PRN
  Administered 2018-11-12: 1.6 via INTRAVENOUS

## 2018-11-12 MED ORDER — ENOXAPARIN SODIUM 30 MG/0.3ML ~~LOC~~ SOLN
30.0000 mg | SUBCUTANEOUS | Status: DC
  Administered 2018-11-12 – 2018-11-16 (×5): 30 mg via SUBCUTANEOUS
  Filled 2018-11-12 (×5): qty 0.3

## 2018-11-12 NOTE — TOC Initial Note (Signed)
Transition of Care [facility]) - Initial/Assessment Note    Patient Details  Name: Lawrence Warner MRN: [id number] Date of Birth: 1918-08-21  Transition of Care [facility]) CM/SW Contact:    [name], RN Phone Number: 11/12/2018, 10:57 AM  Clinical Narrative: Patient from [facility].PT cons-await recc. Patient has gone to [facility] in the past would agree to go if needed.                  Expected Discharge Plan: Skilled Nursing Facility Barriers to Discharge: Continued Medical Work up   Patient Goals and CMS Choice Patient states their goals for this hospitalization and ongoing recovery are:: go to rehab CMS Medicare.gov Compare Post Acute Care list provided to:: Patient Represenative (must comment) Choice offered to / list presented to : Patient, Adult Children  Expected Discharge Plan and Services Expected Discharge Plan: [place]   Discharge Planning Services: CM Consult Post Acute Care Choice: [place] Living arrangements for the past 2 months: [place]                                      Prior Living Arrangements/Services Living arrangements for the past 2 months: [place] Lives with:: Facility Resident Patient language and need for interpreter reviewed:: Yes Do you feel safe going back to the place where you live?: Yes      Need for Family Participation in Patient Care: No (Comment) Care giver support system in place?: Yes (comment) Current home services: DME(cane, rw) Criminal Activity/Legal Involvement Pertinent to Current Situation/Hospitalization: No - Comment as needed  Activities of Daily Living Home Assistive Devices/Equipment: Cane (specify quad or straight) ADL Screening (condition at time of admission) Patient's cognitive ability adequate to safely complete daily activities?: Yes Is the patient deaf or have difficulty hearing?: Yes Does the patient have  difficulty seeing, even when wearing glasses/contacts?: No Does the patient have difficulty concentrating, remembering, or making decisions?: No Patient able to express need for assistance with ADLs?: Yes Does the patient have difficulty dressing or bathing?: No Independently performs ADLs?: Yes (appropriate for developmental age) Does the patient have difficulty walking or climbing stairs?: Yes Weakness of Legs: Both Weakness of Arms/Hands: None  Permission Sought/Granted Permission sought to share information with : Case Manager Permission granted to share information with : Yes, Verbal Permission Granted  Share Information with NAME: [place] Alexa  Permission granted to share info w AGENCY: SNF  Permission granted to share info w Relationship: Son  Permission granted to share info w Contact Information: [phone]  Emotional Assessment Appearance:: Appears stated age Attitude/Demeanor/Rapport: Gracious Affect (typically observed): Accepting Orientation: : Oriented to Self, Oriented to Place, Oriented to  Time, Oriented to Situation Alcohol / Substance Use: Not Applicable Psych Involvement: No (comment)  Admission diagnosis:  shob Patient Active Problem List   Diagnosis Date Noted  . Coronary artery disease involving native coronary artery of native heart without angina pectoris 01/08/2018  . Generalized weakness 05/17/2017  . Hypertensive emergency 05/17/2017  . RSV bronchitis   . Bronchospasm with bronchitis, acute 03/14/2017  . Acute on chronic diastolic CHF (congestive heart failure) ([place]) 11/26/2016  . CRI (chronic renal insufficiency), stage 3 (moderate) ([place]) 11/26/2016  . LBBB (left bundle branch block) 11/26/2016  . Hypoxia   . Acute respiratory failure with hypoxia ([place]) 07/20/2016  . Chronic respiratory failure with hypoxia ([place]) 02/28/2016  .  Dyslipidemia 02/28/2016  . Constipation 12/07/2015  . Bradycardia 11/28/2015  . Port catheter in place 10/24/2015  .  GERD (gastroesophageal reflux disease) 10/09/2015  . Gait instability 10/09/2015  . Chest pain 10/05/2015  . Diet-controlled diabetes mellitus ([place]) 05/07/2015  . Hx of CABG 05/07/2015  . Essential hypertension 05/07/2015  . Muscle cramps 04/25/2015  . Shortness of breath 04/11/2015  . Idiopathic thrombocytopenic purpura ([place]) 09/27/2014   PCP:  Clovia Cuff, MD Pharmacy:   [facility] [place] [address] [place] [address] Phone: [phone] Fax: [phone]  [facility] [address] [place] - [address] [place] [address] Phone: [phone] Fax: [phone]     Social Determinants of Health (SDOH) Interventions    Readmission Risk Interventions No flowsheet data found.

## 2018-11-12 NOTE — Care Management Obs Status (Signed)
[place] NOTIFICATION   Patient Details  Name: Kendyn Zaman MRN: [id number] Date of Birth: [birth year]   Medicare Observation Status Notification Given:  Yes    MahabirJuliann Pulse, RN 11/12/2018, 10:53 AM

## 2018-11-12 NOTE — Progress Notes (Signed)
  Echocardiogram 2D Echocardiogram has been performed.  Burnett Kanaris 11/12/2018, 11:47 AM

## 2018-11-12 NOTE — Progress Notes (Addendum)
PROGRESS NOTE    Lawrence Warner  HMC:[id number] DOB: [birth year] DOA: 11/11/2018 PCP: Clovia Cuff, MD   Brief Narrative:  HPI per Dr. Jennette Kettle  Lawrence Warner is a 85 y.o. male with medical history significant of CKD stage 3, DM2, HTN, CAD s/p CABG, diastolic CHF.  Patient presents to the ED with c/o acute onset, progressively worsening L sided CP and SOB for past 2 days.  CP is worse with deep breaths, sharp in quality, L side of chest and radiates down into abdomen.  Associated orthopnea with SOB, no peripheral edema, no recent travel nor surgeries.  Satting 88% on RA, improved to 98% on 2L via [place].  No fever, nausea, vomiting, dysuria, diarrhea.   ED Course: Trops were 26 and 23.  BNP 454.9.  EKG shows LBBB with trigeminy (unchanged from priors).  CXR shows mild CHF findings (pulm edema, pulm vas congestion).  WBC nl, no fever, BP running high (662H-476L systolic).  BUN 30 and creat 1.69 (1.5 baseline).  Given 40mg  IV lasix and hospitalist asked to admit.  Assessment & Plan:   Principal Problem:   Acute on chronic diastolic CHF (congestive heart failure) (HCC) Active Problems:   Diet-controlled diabetes mellitus (HCC)   Hx of CABG   Essential hypertension   Acute respiratory failure with hypoxia (HCC)   CHF (congestive heart failure) (HCC)  #1 acute respiratory failure with hypoxia Felt secondary to acute on chronic diastolic CHF exacerbation.  Patient with some clinical improvement with diuretics.  Cardiac enzymes minimally elevated by flattened.  BNP at 454.9.  D-dimer 3.63.  Continue Lasix 40 mg IV daily.  Strict I's and O's.  Daily weights.  We will check a VQ scan to rule out PE as patient with complaints of some pleuritic chest pain.  2.  Acute on chronic diastolic CHF exacerbation Questionable etiology.  Troponin is minimally elevated however seem to be flattened.  BNP of 454.9.  EKG with left bundle branch block with trigeminy unchanged from prior  EKGs.  Patient with a urine output of 600 cc since admission.  2D echo pending.  Continue Lasix 40 mg IV daily.  Continue Cozaar.  Strict I's and O's.  Daily weights.  3.  Hypertension Stable.  Continue Cozaar and Lasix.  4.  Diet-controlled diabetes mellitus type 2 Hemoglobin A1c 7.1 on 10/03/2018.  Outpatient follow-up.  5.  History of coronary artery disease status post CABG Stable.  2D echo pending.  Continue Lasix, Cozaar, statin.  Outpatient follow-up.   DVT prophylaxis: Lovenox Code Status: DNR Family Communication: Updated patient.  Updated son via telephone. Disposition Plan: Likely home when medically improved, improvement with shortness of breath and CHF exacerbation.   Consultants:   None  Procedures:   VQ scan pending  2D echo pending  Chest x-ray 11/11/2018    Antimicrobials:   None   Subjective: Patient sitting up in bed eating lunch.  Patient states some improvement with pleuritic chest pain.  Patient states some improvement with shortness of breath since admission.  Patient denies any recent surgeries, no significant immobility, no recent long car rides or travel.  Objective: Vitals:   11/12/18 0456 11/12/18 0458 11/12/18 1000 11/12/18 1748  BP: (!) 157/75  (!) 160/72 (!) 145/67  Pulse: 69  69 64  Resp: 18   18  Temp: 98.8 F (37.1 C)   97.6 F (36.4 C)  TempSrc: Oral     SpO2: 95%   94%  Weight:  87.4 kg  Height:        Intake/Output Summary (Last 24 hours) at 11/12/2018 1854 Last data filed at 11/12/2018 1800 Gross per 24 hour  Intake 960 ml  Output 1150 ml  Net -190 ml   Filed Weights   11/11/18 2309 11/11/18 2335 11/12/18 0458  Weight: 90.3 kg 88 kg 87.4 kg    Examination:  General exam: Appears calm and comfortable  Respiratory system: Bibasilar crackles.  No wheezes, no rhonchi.  Normal respiratory effort.  Speaking in full sentences.   Cardiovascular system: S1 & S2 heard, RRR. No JVD, murmurs, rubs, gallops or clicks. No  pedal edema. Gastrointestinal system: Abdomen is nondistended, soft and nontender. No organomegaly or masses felt. Normal bowel sounds heard. Central nervous system: Alert and oriented. No focal neurological deficits. Extremities: Symmetric 5 x 5 power. Skin: No rashes, lesions or ulcers Psychiatry: Judgement and insight appear normal. Mood & affect appropriate.     Data Reviewed: I have personally reviewed following labs and imaging studies  CBC: Recent Labs  Lab 11/11/18 1821  WBC 5.2  HGB 13.2  HCT 41.8  MCV 97.9  PLT 122*   Basic Metabolic Panel: Recent Labs  Lab 11/11/18 1821 11/12/18 0512  NA 139 138  K 4.1 4.0  CL 105 108  CO2 25 23  GLUCOSE 135* 116*  BUN 30* 31*  CREATININE 1.69* 1.61*  CALCIUM 9.0 8.8*   GFR: Estimated Creatinine Clearance: 28.7 mL/min (A) (by C-G formula based on SCr of 1.61 mg/dL (H)). Liver Function Tests: No results for input(s): AST, ALT, ALKPHOS, BILITOT, PROT, ALBUMIN in the last 168 hours. No results for input(s): LIPASE, AMYLASE in the last 168 hours. No results for input(s): AMMONIA in the last 168 hours. Coagulation Profile: No results for input(s): INR, PROTIME in the last 168 hours. Cardiac Enzymes: No results for input(s): CKTOTAL, CKMB, CKMBINDEX, TROPONINI in the last 168 hours. BNP (last 3 results) No results for input(s): PROBNP in the last 8760 hours. HbA1C: No results for input(s): HGBA1C in the last 72 hours. CBG: No results for input(s): GLUCAP in the last 168 hours. Lipid Profile: No results for input(s): CHOL, HDL, LDLCALC, TRIG, CHOLHDL, LDLDIRECT in the last 72 hours. Thyroid Function Tests: No results for input(s): TSH, T4TOTAL, FREET4, T3FREE, THYROIDAB in the last 72 hours. Anemia Panel: No results for input(s): VITAMINB12, FOLATE, FERRITIN, TIBC, IRON, RETICCTPCT in the last 72 hours. Sepsis Labs: No results for input(s): PROCALCITON, LATICACIDVEN in the last 168 hours.  Recent Results (from the past  240 hour(s))  SARS Coronavirus 2 [facility] order, Performed in [facility] hospital lab) Nasopharyngeal Nasopharyngeal Swab     Status: None   Collection Time: 11/11/18  6:34 PM   Specimen: Nasopharyngeal Swab  Result Value Ref Range Status   SARS Coronavirus 2 NEGATIVE NEGATIVE Final    Comment: (NOTE) If result is NEGATIVE SARS-CoV-2 target nucleic acids are NOT DETECTED. The SARS-CoV-2 RNA is generally detectable in upper and lower  respiratory specimens during the acute phase of infection. The lowest  concentration of SARS-CoV-2 viral copies this assay can detect is 250  copies / mL. A negative result does not preclude SARS-CoV-2 infection  and should not be used as the sole basis for treatment or other  patient management decisions.  A negative result may occur with  improper specimen collection / handling, submission of specimen other  than nasopharyngeal swab, presence of viral mutation(s) within the  areas targeted by this assay, and inadequate number of viral  copies  (<250 copies / mL). A negative result must be combined with clinical  observations, patient history, and epidemiological information. If result is POSITIVE SARS-CoV-2 target nucleic acids are DETECTED. The SARS-CoV-2 RNA is generally detectable in upper and lower  respiratory specimens dur ing the acute phase of infection.  Positive  results are indicative of active infection with SARS-CoV-2.  Clinical  correlation with patient history and other diagnostic information is  necessary to determine patient infection status.  Positive results do  not rule out bacterial infection or co-infection with other viruses. If result is PRESUMPTIVE POSTIVE SARS-CoV-2 nucleic acids MAY BE PRESENT.   A presumptive positive result was obtained on the submitted specimen  and confirmed on repeat testing.  While 2019 novel coronavirus  (SARS-CoV-2) nucleic acids may be present in the submitted sample  additional confirmatory  testing may be necessary for epidemiological  and / or clinical management purposes  to differentiate between  SARS-CoV-2 and other Sarbecovirus currently known to infect humans.  If clinically indicated additional testing with an alternate test  methodology [phone]) is advised. The SARS-CoV-2 RNA is generally  detectable in upper and lower respiratory sp ecimens during the acute  phase of infection. The expected result is Negative. Fact Sheet for Patients:  StrictlyIdeas.no Fact Sheet for Healthcare Providers: BankingDealers.co.za This test is not yet approved or cleared by the [place] FDA and has been authorized for detection and/or diagnosis of SARS-CoV-2 by FDA under an Emergency Use Authorization (EUA).  This EUA will remain in effect (meaning this test can be used) for the duration of the COVID-19 declaration under Section 564(b)(1) of the Act, 21 U.S.C. section 360bbb-3(b)(1), unless the authorization is terminated or revoked sooner. Performed at [facility], [place] [address], [place], [place] [address]          Radiology Studies: Dg Chest 2 View  Result Date: 11/11/2018 CLINICAL DATA:  85 year old male with chest pain and shortness of breath on the left for the past 2 days. EXAM: CHEST - 2 VIEW COMPARISON:  Prior chest x-ray 02/06/2018 FINDINGS: Stable mild cardiomegaly. Patient is status post median sternotomy with evidence of prior multivessel CABG. Slight interval increase in pulmonary vascular congestion now bordering on mild interstitial edema. Small left pleural effusion. No pneumothorax. No focal airspace infiltrate. No acute osseous abnormality. Band 2 spine suggests ankylosing spondylosis. IMPRESSION: 1. Mild CHF. 2. Small left pleural effusion. 3. Ankylosing spondylosis. Electronically Signed   By: Jacqulynn Cadet M.D.   On: 11/11/2018 19:28   Nm Pulmonary Perf And Vent  Result Date:  11/12/2018 CLINICAL DATA:  Shortness of breath and chest pain EXAM: NUCLEAR MEDICINE VENTILATION - PERFUSION LUNG SCAN VIEWS: Anterior, posterior, left lateral, right lateral, RPO, LPO, RAO, LAO-ventilation and perfusion RADIOPHARMACEUTICALS:  31.0 6 mCi of Tc-28m DTPA aerosol inhalation and mCi Tc3m MAA IV COMPARISON:  Chest radiograph November 11, 2018 FINDINGS: Ventilation: There are foci of decreased uptake in each upper lobe, more focal in the posterior segment left lobe region on the left than on the right. The remainder the ventilation study appears unremarkable. Perfusion: There are upper lobe areas of photopenia, more notable on the left than on the right, which match ventilation defects. No appreciable ventilation/perfusion mismatch. IMPRESSION: Matching upper lobe defects, larger on the left than on the right. No appreciable ventilation/perfusion mismatch. This study constitutes an overall low probability of pulmonary embolus based on PIOPED II criteria. Electronically Signed   By: Lowella Grip III M.D.  On: 11/12/2018 16:15        Scheduled Meds:  brimonidine  1 drop Both Eyes TID   cycloSPORINE  1 drop Both Eyes Q12H   enoxaparin (LOVENOX) injection  30 mg Subcutaneous Q24H   furosemide  40 mg Intravenous Daily   losartan  100 mg Oral Daily   magnesium oxide  400 mg Oral Daily   multivitamin with minerals  1 tablet Oral Daily   polyvinyl alcohol   Both Eyes QID   simvastatin  20 mg Oral q1800   sodium chloride flush  3 mL Intravenous Q12H   Continuous Infusions:  sodium chloride       LOS: 0 days    Time spent: 40 minutes    Irine Seal, MD [facility]  If 7PM-7AM, please contact night-coverage [email or web address] 11/12/2018, 6:54 PM

## 2018-11-13 ENCOUNTER — Encounter (HOSPITAL_COMMUNITY): Payer: Self-pay

## 2018-11-13 LAB — BASIC METABOLIC PANEL
Anion gap: 10 (ref 5–15)
BUN: 49 mg/dL — ABNORMAL HIGH (ref 8–23)
CO2: 23 mmol/L (ref 22–32)
Calcium: 9 mg/dL (ref 8.9–10.3)
Chloride: 105 mmol/L (ref 98–111)
Creatinine, Ser: 2.22 mg/dL — ABNORMAL HIGH (ref 0.61–1.24)
GFR calc Af Amer: 28 mL/min — ABNORMAL LOW (ref >60)
GFR calc non Af Amer: 24 mL/min — ABNORMAL LOW (ref >60)
Glucose, Bld: 122 mg/dL — ABNORMAL HIGH (ref 70–99)
Potassium: 4.4 mmol/L (ref 3.5–5.1)
Sodium: 138 mmol/L (ref 135–145)

## 2018-11-13 LAB — CBC
HCT: 42.6 % (ref 39.0–52.0)
Hemoglobin: 13.6 g/dL (ref 13.0–17.0)
MCH: 31.2 pg (ref 26.0–34.0)
MCHC: 31.9 g/dL (ref 30.0–36.0)
MCV: 97.7 fL (ref 80.0–100.0)
Platelets: 121 10*3/uL — ABNORMAL LOW (ref 150–400)
RBC: 4.36 MIL/uL (ref 4.22–5.81)
RDW: 12.5 % (ref 11.5–15.5)
WBC: 6.2 10*3/uL (ref 4.0–10.5)
nRBC: 0 % (ref 0.0–0.2)

## 2018-11-13 LAB — MAGNESIUM: Magnesium: 2.4 mg/dL (ref 1.7–2.4)

## 2018-11-13 MED ORDER — CARVEDILOL 3.125 MG PO TABS
3.1250 mg | ORAL_TABLET | Freq: Two times a day (BID) | ORAL | Status: DC
  Administered 2018-11-13: 12:00:00 3.125 mg via ORAL
  Filled 2018-11-13 (×2): qty 1

## 2018-11-13 MED ORDER — FUROSEMIDE 20 MG PO TABS
20.0000 mg | ORAL_TABLET | Freq: Every day | ORAL | Status: DC
  Administered 2018-11-13 – 2018-11-17 (×5): 20 mg via ORAL
  Filled 2018-11-13 (×5): qty 1

## 2018-11-13 MED ORDER — INFLUENZA VAC A&B SA ADJ QUAD 0.5 ML IM PRSY
0.5000 mL | PREFILLED_SYRINGE | INTRAMUSCULAR | Status: AC
  Administered 2018-11-14: 0.5 mL via INTRAMUSCULAR
  Filled 2018-11-13: qty 0.5

## 2018-11-13 NOTE — Progress Notes (Signed)
PROGRESS NOTE    Lawrence Warner  KKX:[id number] DOB: 1918/05/23 DOA: 11/11/2018 PCP: Clovia Cuff, MD   Brief Narrative:  HPI per Dr. Jennette Kettle  Lawrence Warner is a 85 y.o. male with medical history significant of CKD stage 3, DM2, HTN, CAD s/p CABG, diastolic CHF.  Patient presents to the ED with c/o acute onset, progressively worsening L sided CP and SOB for past 2 days.  CP is worse with deep breaths, sharp in quality, L side of chest and radiates down into abdomen.  Associated orthopnea with SOB, no peripheral edema, no recent travel nor surgeries.  Satting 88% on RA, improved to 98% on 2L via [place].  No fever, nausea, vomiting, dysuria, diarrhea.   ED Course: Trops were 26 and 23.  BNP 454.9.  EKG shows LBBB with trigeminy (unchanged from priors).  CXR shows mild CHF findings (pulm edema, pulm vas congestion).  WBC nl, no fever, BP running high (169C-789F systolic).  BUN 30 and creat 1.69 (1.5 baseline).  Given 40mg  IV lasix and hospitalist asked to admit.  Assessment & Plan:   Principal Problem:   Acute on chronic diastolic CHF (congestive heart failure) (HCC) Active Problems:   Diet-controlled diabetes mellitus (HCC)   Hx of CABG   Essential hypertension   Acute respiratory failure with hypoxia (HCC)   CHF (congestive heart failure) (HCC)  1 acute respiratory failure with hypoxia Felt secondary to acute on chronic diastolic CHF exacerbation.  Patient with clinical improvement on diuretics.  Cardiac enzymes minimally elevated by flattened.  BNP at 454.9.  D-dimer 3.63.  Patient with a bump in creatinine currently at 2.22 from 1.61.  DC IV Lasix.  Likely placed on oral Lasix at 20 mg daily either later on today or tomorrow.  Continue Cozaar.  VQ scan with low probability for PE.  Strict I's and O's.  Daily weights.   2.  Acute on chronic diastolic CHF exacerbation Questionable etiology.  Troponin is minimally elevated however seem to be flattened.  BNP of  454.9.  EKG with left bundle branch block with trigeminy unchanged from prior EKGs.  Patient with a urine output of 600 cc since admission.  2D echo  with a EF of 50 to 55%, left ventricular septal wall thickness mildly increased, mildly increased left ventricular posterior wall thickness, mildly increased left ventricular hypertrophy, diastolic dysfunction.  No wall motion abnormalities.  Patient with a bump in creatinine and as such we will discontinue IV Lasix and hold Lasix today.  Likely resume oral Lasix at 20 mg daily starting later on today or tomorrow.  Continue Cozaar.  Strict I's and O's.  Daily weights.   3.  Hypertension Stable.  Continue Cozaar.  Hold IV Lasix due to bump in creatinine.  4.  Diet-controlled diabetes mellitus type 2 Hemoglobin A1c 7.1 on 10/03/2018.  Outpatient follow-up.  5.  History of coronary artery disease status post CABG Stable.  2D echo with a EF of 50 to 55%, left ventricular septal wall thickness mildly increased, mildly increased left ventricular posterior wall thickness, mildly increased left ventricular hypertrophy, diastolic dysfunction.  No wall motion abnormalities.  We will hold IV Lasix today due to bump in creatinine.  Continue Cozaar, statin.Outpatient follow-up.   DVT prophylaxis: Lovenox Code Status: DNR Family Communication: Updated patient.  No family at bedside. Disposition Plan: Likely home with home health therapies when medically improved, improvement with shortness of breath and CHF exacerbation.   Consultants:   None  Procedures:   VQ  scan 11/12/2018  2D echo 11/12/2018  Chest x-ray 11/11/2018    Antimicrobials:   None   Subjective: Patient sleeping however easily arousable.  Feels shortness of breath is improved.  States pleuritic chest pain improved.  No nausea no vomiting.  Feeling better than on admission.   Objective: Vitals:   11/13/18 0522 11/13/18 0527 11/13/18 0958 11/13/18 1142  BP:  (!) 146/118 125/66    Pulse:  65 78 64  Resp:  (!) 24 19   Temp:  98 F (36.7 C) 97.8 F (36.6 C)   TempSrc:  Oral Oral   SpO2:  95% 95%   Weight: 86.5 kg     Height:        Intake/Output Summary (Last 24 hours) at 11/13/2018 1243 Last data filed at 11/12/2018 1800 Gross per 24 hour  Intake 360 ml  Output 600 ml  Net -240 ml   Filed Weights   11/11/18 2335 11/12/18 0458 11/13/18 0522  Weight: 88 kg 87.4 kg 86.5 kg    Examination:  General exam: Appears calm and comfortable  Respiratory system: Decreased bibasilar crackles.  No wheezing, no rhonchi.  Normal respiratory effort.  Speaking in full sentences.  Cardiovascular system: Regular rate rhythm no murmurs rubs or gallops.  No JVD.  No lower extremity edema.  Gastrointestinal system: Abdomen is soft, nontender, nondistended, positive bowel sounds.  No rebound.  No guarding.  Positive bowel sounds.   Central nervous system: Alert and oriented. No focal neurological deficits. Extremities: Symmetric 5 x 5 power. Skin: No rashes, lesions or ulcers Psychiatry: Judgement and insight appear normal. Mood & affect appropriate.     Data Reviewed: I have personally reviewed following labs and imaging studies  CBC: Recent Labs  Lab 11/11/18 1821 11/13/18 0433  WBC 5.2 6.2  HGB 13.2 13.6  HCT 41.8 42.6  MCV 97.9 97.7  PLT 130* 284*   Basic Metabolic Panel: Recent Labs  Lab 11/11/18 1821 11/12/18 0512 11/13/18 0433  NA 139 138 138  K 4.1 4.0 4.4  CL 105 108 105  CO2 25 23 23   GLUCOSE 135* 116* 122*  BUN 30* 31* 49*  CREATININE 1.69* 1.61* 2.22*  CALCIUM 9.0 8.8* 9.0  MG  --   --  2.4   GFR: Estimated Creatinine Clearance: 20.7 mL/min (A) (by C-G formula based on SCr of 2.22 mg/dL (H)). Liver Function Tests: No results for input(s): AST, ALT, ALKPHOS, BILITOT, PROT, ALBUMIN in the last 168 hours. No results for input(s): LIPASE, AMYLASE in the last 168 hours. No results for input(s): AMMONIA in the last 168 hours. Coagulation  Profile: No results for input(s): INR, PROTIME in the last 168 hours. Cardiac Enzymes: No results for input(s): CKTOTAL, CKMB, CKMBINDEX, TROPONINI in the last 168 hours. BNP (last 3 results) No results for input(s): PROBNP in the last 8760 hours. HbA1C: No results for input(s): HGBA1C in the last 72 hours. CBG: No results for input(s): GLUCAP in the last 168 hours. Lipid Profile: No results for input(s): CHOL, HDL, LDLCALC, TRIG, CHOLHDL, LDLDIRECT in the last 72 hours. Thyroid Function Tests: No results for input(s): TSH, T4TOTAL, FREET4, T3FREE, THYROIDAB in the last 72 hours. Anemia Panel: No results for input(s): VITAMINB12, FOLATE, FERRITIN, TIBC, IRON, RETICCTPCT in the last 72 hours. Sepsis Labs: No results for input(s): PROCALCITON, LATICACIDVEN in the last 168 hours.  Recent Results (from the past 240 hour(s))  SARS Coronavirus 2 [facility] order, Performed in [facility] hospital lab) Nasopharyngeal Nasopharyngeal Swab  Status: None   Collection Time: 11/11/18  6:34 PM   Specimen: Nasopharyngeal Swab  Result Value Ref Range Status   SARS Coronavirus 2 NEGATIVE NEGATIVE Final    Comment: (NOTE) If result is NEGATIVE SARS-CoV-2 target nucleic acids are NOT DETECTED. The SARS-CoV-2 RNA is generally detectable in upper and lower  respiratory specimens during the acute phase of infection. The lowest  concentration of SARS-CoV-2 viral copies this assay can detect is 250  copies / mL. A negative result does not preclude SARS-CoV-2 infection  and should not be used as the sole basis for treatment or other  patient management decisions.  A negative result may occur with  improper specimen collection / handling, submission of specimen other  than nasopharyngeal swab, presence of viral mutation(s) within the  areas targeted by this assay, and inadequate number of viral copies  (<250 copies / mL). A negative result must be combined with clinical  observations, patient history,  and epidemiological information. If result is POSITIVE SARS-CoV-2 target nucleic acids are DETECTED. The SARS-CoV-2 RNA is generally detectable in upper and lower  respiratory specimens dur ing the acute phase of infection.  Positive  results are indicative of active infection with SARS-CoV-2.  Clinical  correlation with patient history and other diagnostic information is  necessary to determine patient infection status.  Positive results do  not rule out bacterial infection or co-infection with other viruses. If result is PRESUMPTIVE POSTIVE SARS-CoV-2 nucleic acids MAY BE PRESENT.   A presumptive positive result was obtained on the submitted specimen  and confirmed on repeat testing.  While 2019 novel coronavirus  (SARS-CoV-2) nucleic acids may be present in the submitted sample  additional confirmatory testing may be necessary for epidemiological  and / or clinical management purposes  to differentiate between  SARS-CoV-2 and other Sarbecovirus currently known to infect humans.  If clinically indicated additional testing with an alternate test  methodology [phone]) is advised. The SARS-CoV-2 RNA is generally  detectable in upper and lower respiratory sp ecimens during the acute  phase of infection. The expected result is Negative. Fact Sheet for Patients:  StrictlyIdeas.no Fact Sheet for Healthcare Providers: BankingDealers.co.za This test is not yet approved or cleared by the [place] FDA and has been authorized for detection and/or diagnosis of SARS-CoV-2 by FDA under an Emergency Use Authorization (EUA).  This EUA will remain in effect (meaning this test can be used) for the duration of the COVID-19 declaration under Section 564(b)(1) of the Act, 21 U.S.C. section 360bbb-3(b)(1), unless the authorization is terminated or revoked sooner. Performed at [facility], [place] [address], [place], [place] [address]           Radiology Studies: Dg Chest 2 View  Result Date: 11/11/2018 CLINICAL DATA:  85 year old male with chest pain and shortness of breath on the left for the past 2 days. EXAM: CHEST - 2 VIEW COMPARISON:  Prior chest x-ray 02/06/2018 FINDINGS: Stable mild cardiomegaly. Patient is status post median sternotomy with evidence of prior multivessel CABG. Slight interval increase in pulmonary vascular congestion now bordering on mild interstitial edema. Small left pleural effusion. No pneumothorax. No focal airspace infiltrate. No acute osseous abnormality. Band 2 spine suggests ankylosing spondylosis. IMPRESSION: 1. Mild CHF. 2. Small left pleural effusion. 3. Ankylosing spondylosis. Electronically Signed   By: Jacqulynn Cadet M.D.   On: 11/11/2018 19:28   Nm Pulmonary Perf And Vent  Result Date: 11/12/2018 CLINICAL DATA:  Shortness of breath and chest pain EXAM:  NUCLEAR MEDICINE VENTILATION - PERFUSION LUNG SCAN VIEWS: Anterior, posterior, left lateral, right lateral, RPO, LPO, RAO, LAO-ventilation and perfusion RADIOPHARMACEUTICALS:  31.0 6 mCi of Tc-56m DTPA aerosol inhalation and mCi Tc62m MAA IV COMPARISON:  Chest radiograph November 11, 2018 FINDINGS: Ventilation: There are foci of decreased uptake in each upper lobe, more focal in the posterior segment left lobe region on the left than on the right. The remainder the ventilation study appears unremarkable. Perfusion: There are upper lobe areas of photopenia, more notable on the left than on the right, which match ventilation defects. No appreciable ventilation/perfusion mismatch. IMPRESSION: Matching upper lobe defects, larger on the left than on the right. No appreciable ventilation/perfusion mismatch. This study constitutes an overall low probability of pulmonary embolus based on PIOPED II criteria. Electronically Signed   By: Lowella Grip III M.D.   On: 11/12/2018 16:15        Scheduled Meds: . brimonidine  1 drop Both Eyes  TID  . carvedilol  3.125 mg Oral BID WC  . cycloSPORINE  1 drop Both Eyes Q12H  . enoxaparin (LOVENOX) injection  30 mg Subcutaneous Q24H  . losartan  100 mg Oral Daily  . magnesium oxide  400 mg Oral Daily  . multivitamin with minerals  1 tablet Oral Daily  . polyvinyl alcohol   Both Eyes QID  . simvastatin  20 mg Oral q1800  . sodium chloride flush  3 mL Intravenous Q12H   Continuous Infusions: . sodium chloride       LOS: 1 day    Time spent: 40 minutes    Irine Seal, MD [facility]  If 7PM-7AM, please contact night-coverage [email or web address] 11/13/2018, 12:43 PM

## 2018-11-13 NOTE — Evaluation (Signed)
Physical Therapy Evaluation Patient Details Name: Lawrence Warner MRN: [id number] DOB: 02/15/1919 Today's Date: 11/13/2018   History of Present Illness  85 y.o. male admitted with SOB,  and L sided CP--> acute on chronic CHF. VQ scan negative for PE. medical history: CKD stage 3, DM2, HTN, CAD s/p CABG, diastolic CHF.  Clinical Impression  Pt admitted with above diagnosis.  Pt tolerated amb in hallway however fatigues quickly. Will continue to follow in acute setting.  Hopefully will be able to d/c home with HHPT and possible incr assist at home initially.   Pt currently with functional limitations due to the deficits listed below (see PT Problem List). Pt will benefit from skilled PT to increase their independence and safety with mobility to allow discharge to the venue listed below.       Follow Up Recommendations Home health PT(may hire incr assist initially (?))    Equipment Recommendations  Other (comment)(?hospital bed)    Recommendations for Other Services       Precautions / Restrictions Precautions Precautions: Fall Precaution Comments: O2/monitor sats Restrictions Weight Bearing Restrictions: No      Mobility  Bed Mobility Overal bed mobility: Needs Assistance Bed Mobility: Supine to Sit     Supine to sit: HOB elevated;Min guard     General bed mobility comments: min/guard for safety, incr time needed, heavy use of rail  Transfers Overall transfer level: Needs assistance Equipment used: Straight cane Transfers: Sit to/from [employer] Transfers Sit to Stand: Min guard;Min assist Stand pivot transfers: Min guard       General transfer comment: incr time, cues for hand placement and safety. min/guard for safety with stand pivot, no AD, no LOB  Ambulation/Gait Ambulation/Gait assistance: Min guard Gait Distance (Feet): 45 Feet Assistive device: 1 person hand held assist;Straight cane Gait Pattern/deviations: Step-through pattern;Decreased stride  length     General Gait Details: guarded, slow  gait, fatigues easily. SpO2= 92-80% on RA, O2 replaced at 2L and returned to 90s with 30 sec rest  Stairs            Wheelchair Mobility    Modified Rankin (Stroke Patients Only)       Balance Overall balance assessment: Needs assistance(denies falls) Sitting-balance support: No upper extremity supported;Feet supported Sitting balance-Leahy Scale: Good       Standing balance-Leahy Scale: Fair Standing balance comment: reliant on UEs for dynamic activity                             Pertinent Vitals/Pain Pain Assessment: No/denies pain    Home Living Family/patient expects to be discharged to:: Private residence Living Arrangements: Alone([place]) Available Help at Discharge: Family;Available PRN/intermittently Type of Home: Independent living facility Home Access: Level entry     Home Layout: One level Home Equipment: Walker - 2 wheels;Cane - single point;Electric scooter;Wheelchair - manual Additional Comments: pt uses scooter for longer distances, going to dining room; amb with cane in apt    Prior Function Level of Independence: Independent with assistive device(s)         Comments: independent amb with cane for short distances. facility provides meals and cleaning services     Hand Dominance        Extremity/Trunk Assessment   Upper Extremity Assessment Upper Extremity Assessment: Defer to OT evaluation    Lower Extremity Assessment Lower Extremity Assessment: (grossly 3+ to 4/5 bil LEs)    Cervical / Trunk Assessment Cervical /  Trunk Assessment: (limited cervical motion d/t fusions)  Communication   Communication: No difficulties  Cognition Arousal/Alertness: Awake/alert Behavior During Therapy: WFL for tasks assessed/performed Overall Cognitive Status: Within Functional Limits for tasks assessed                                        General Comments       Exercises     Assessment/Plan    PT Assessment Patient needs continued PT services  PT Problem List Decreased strength;Decreased activity tolerance;Decreased balance;Decreased mobility;Cardiopulmonary status limiting activity       PT Treatment Interventions Gait training;Therapeutic exercise;Functional mobility training;Therapeutic activities;Patient/family education    PT Goals (Current goals can be found in the Care Plan section)  Acute Rehab PT Goals Patient Stated Goal: back to [facility] PT Goal Formulation: With patient Time For Goal Achievement: 11/27/18 Potential to Achieve Goals: Good    Frequency Min 3X/week   Barriers to discharge        Co-evaluation               AM-PAC PT "6 Clicks" Mobility  Outcome Measure Help needed turning from your back to your side while in a flat bed without using bedrails?: A Little Help needed moving from lying on your back to sitting on the side of a flat bed without using bedrails?: A Little Help needed moving to and from a bed to a chair (including a wheelchair)?: A Little Help needed standing up from a chair using your arms (e.g., wheelchair or bedside chair)?: A Little Help needed to walk in hospital room?: A Little Help needed climbing 3-5 steps with a railing? : A Lot 6 Click Score: 17    End of Session Equipment Utilized During Treatment: Gait belt   Patient left: in chair;with call bell/phone within reach;with chair alarm set   PT Visit Diagnosis: Unsteadiness on feet (R26.81)    Time: 5784-6962 PT Time Calculation (min) (ACUTE ONLY): 17 min   Charges:   PT Evaluation $PT Eval Low Complexity: 1 Low          Kenyon Ana, PT  Pager: [phone] Acute Rehab Dept [facility]): [phone]   11/13/2018   [facility] 11/13/2018, 11:26 AM

## 2018-11-13 NOTE — TOC Progression Note (Signed)
Transition of Care [facility]) - Progression Note    Patient Details  Name: Lawrence Warner MRN: [id number] Date of Birth: 1918-09-30  Transition of Care [facility]) CM/SW Contact  Jaqwon Manfred, Juliann Pulse, RN Phone Number: 11/13/2018, 3:25 PM  Clinical Narrative: Will have hospital bed delivered to [facility] @ d/c once hospital bed order is in place. Also received call from [facility] is aware of [place] through [place], [facility] for custodial level care.      Expected Discharge Plan: [place] Barriers to Discharge: Continued Medical Work up  Expected Discharge Plan and Services Expected Discharge Plan: [place]   Discharge Planning Services: CM Consult Post Acute Care Choice: [place] Living arrangements for the past 2 months: [place]                           HH Arranged: PT, OT [facility] Agency: [place] Date [place]: 11/13/18 Time [place]: [id number] Representative spoke with at [place]: [place] ([place]) Interventions    Readmission Risk Interventions No flowsheet data found.

## 2018-11-13 NOTE — Evaluation (Signed)
Occupational Therapy Evaluation Patient Details Name: Lawrence Warner MRN: [id number] DOB: Mar 06, 1918 Today's Date: 11/13/2018    History of Present Illness 85 y.o. male admitted with SOB,  and L sided CP--> acute on chronic CHF. VQ scan negative for PE. medical history: CKD stage 3, DM2, HTN, CAD s/p CABG, diastolic CHF.   Clinical Impression   Pt was admitted for the above.  He lives at [facility] and is mod I for adls at baseline. Pt currently needs min A overall. Will follow in acute setting with supervision level goals    Follow Up Recommendations  Home health OT(assist for mobility/adls)    Equipment Recommendations  (? 3:1 ) has high commode   Recommendations for Other Services       Precautions / Restrictions Precautions Precautions: Fall Precaution Comments: O2/monitor sats Restrictions Weight Bearing Restrictions: No      Mobility Bed Mobility         Supine to sit: HOB elevated;Min guard     General bed mobility comments: extra effort. Pt states he is considering getting himself a bed that elevates  Transfers   Equipment used: (bedrail)   Sit to Stand: Min assist Stand pivot transfers: Min guard       General transfer comment: light assistance to stand and min guard for safety    Balance                                           ADL either performed or assessed with clinical judgement   ADL Overall ADL's : Needs assistance/impaired Eating/Feeding: Independent;Set up Eating/Feeding Details (indicate cue type and reason): some set up for small, hard to open containers Grooming: Set up   Upper Body Bathing: Set up   Lower Body Bathing: Minimal assistance   Upper Body Dressing : Set up   Lower Body Dressing: Minimal assistance   Toilet Transfer: Minimal assistance Toilet Transfer Details (indicate cue type and reason): to chair Toileting- Clothing Manipulation and Hygiene: Minimal assistance         General  ADL Comments: pt has a sock aide.  He can get pants on without AE at baseline     Vision         Perception     Praxis      Pertinent Vitals/Pain Pain Assessment: No/denies pain     Hand Dominance Right   Extremity/Trunk Assessment Upper Extremity Assessment Upper Extremity Assessment: Overall WFL for tasks assessed(grossly 4/5)           Communication Communication Communication: No difficulties   Cognition Arousal/Alertness: Awake/alert Behavior During Therapy: WFL for tasks assessed/performed Overall Cognitive Status: Within Functional Limits for tasks assessed                                     General Comments       Exercises     Shoulder Instructions      Home Living Family/patient expects to be discharged to:: Private residence Living Arrangements: Alone Available Help at Discharge: Family;Available PRN/intermittently               Bathroom Shower/Tub: Walk-in shower   Bathroom Toilet: Handicapped height     Home Equipment: Environmental consultant - 2 wheels;Cane - single point;Electric scooter;Wheelchair - manual   Additional Comments: from [place];  uses scooter to dining room      Prior Functioning/Environment Level of Independence: Independent with assistive device(s)                 OT Problem List: Decreased strength;Decreased activity tolerance;Impaired balance (sitting and/or standing);Decreased knowledge of use of DME or AE      OT Treatment/Interventions: Self-care/ADL training;DME and/or AE instruction;Energy conservation;Patient/family education;Balance training;Therapeutic activities    OT Goals(Current goals can be found in the care plan section) Acute Rehab OT Goals Patient Stated Goal: back to [facility] OT Goal Formulation: With patient Time For Goal Achievement: 11/27/18 Potential to Achieve Goals: Good ADL Goals Pt Will Transfer to Toilet: with supervision;ambulating(high vs 3:1) Pt Will Perform  Toileting - Clothing Manipulation and hygiene: with supervision Pt Will Perform Tub/Shower Transfer: with supervision;Shower transfer Additional ADL Goal #1: pt will complete adl at supervision level including clothing retrieval  OT Frequency: Min 2X/week   Barriers to D/C:            Co-evaluation              AM-PAC OT "6 Clicks" Daily Activity     Outcome Measure Help from another person eating meals?: A Little Help from another person taking care of personal grooming?: A Little Help from another person toileting, which includes using toliet, bedpan, or urinal?: A Little Help from another person bathing (including washing, rinsing, drying)?: A Little Help from another person to put on and taking off regular upper body clothing?: A Little Help from another person to put on and taking off regular lower body clothing?: A Little 6 Click Score: 18   End of Session    Activity Tolerance: Patient tolerated treatment well Patient left: in chair;with call bell/phone within reach;with chair alarm set  OT Visit Diagnosis: Unsteadiness on feet (R26.81);Muscle weakness (generalized) (M62.81)                Time: 6015-6153 OT Time Calculation (min): 22 min Charges:  OT General Charges $OT Visit: 1 Visit OT Evaluation $OT Eval Low Complexity: [place], OTR/L Acute Rehabilitation Services [phone] [facility] pager [phone] office 11/13/2018  [place] 11/13/2018, 3:28 PM

## 2018-11-13 NOTE — TOC Progression Note (Signed)
Transition of Care [facility]) - Progression Note    Patient Details  Name: Lawrence Warner MRN: [id number] Date of Birth: 10/18/1918  Transition [facility]) CM/SW Contact  Lawrence Warner, Lawrence Pulse, RN Phone Number: 11/13/2018, 3:20 PM  Clinical Narrative:  Received call from son's spouse Lawrence Warner-they do want the hospital bed-MD notified to order. [employer] rep Lawrence Warner notified.    Expected Discharge Plan: [place] Barriers to Discharge: Continued Medical Work up  Expected Discharge Plan and Services Expected Discharge Plan: [place]   Discharge Planning Services: CM Consult Post Acute Care Choice: [place] Living arrangements for the past 2 months: [place]                           HH Arranged: PT, OT [facility] Agency: [place] Date [place]: 11/13/18 Time [place]: [id number] Representative spoke with at [place]: [place] ([place]) Interventions    Readmission Risk Interventions No flowsheet data found.

## 2018-11-13 NOTE — TOC Progression Note (Signed)
Transition of Care [facility]) - Progression Note    Patient Details  Name: Lawrence Warner MRN: [id number] Date of Birth: October 24, 1918  Transition of Care [facility]) CM/SW Contact  Lawrence Warner, Lawrence Pulse, RN Phone Number: 11/13/2018, 12:33 PM  Clinical Narrative: PT recc HHPT, hospital bed,24hr Caregiver asst. TC [name] spoke to his spouse Lawrence Warner [phone] -they agree to [facility] for HHPT/OT-rep [facility] aware & following. 24care giver asst-Family will contact [place] [phone]-the community relations manager-they have custodial level asst/out of pocket cost for 24hr caregiver asst-Lawrence Warner aware-she will contact Dan to make arrangements for the service if needed. They do not want the hospital bed since they cannot fit it into patients rm, & also due to the facilities rules about dme companies coming in & out of facility, & they are in the process of getting an adjustable bed. Family will transport home on own.      Expected Discharge Plan: [place] Barriers to Discharge: Continued Medical Work up  Expected Discharge Plan and Services Expected Discharge Plan: [place]   Discharge Planning Services: CM Consult Post Acute Care Choice: [place] Living arrangements for the past 2 months: [place]                           HH Arranged: PT, OT [facility] Agency: [place] Date [place]: 11/13/18 Time [place]: [id number] Representative spoke with at [place]: [place] ([place]) Interventions    Readmission Risk Interventions No flowsheet data found.

## 2018-11-13 NOTE — Plan of Care (Signed)

## 2018-11-13 NOTE — Progress Notes (Signed)
SATURATION QUALIFICATIONS: (This note is used to comply with regulatory documentation for home oxygen)  Patient Saturations on Room Air at Rest = 87 %  Patient Saturations on Room Air while Ambulating = 82%  Patient Saturations on 2 Liters of oxygen while Ambulating = 91%  Please briefly explain why patient needs home oxygen: pt requires supplemental O2 to maintain adequate O2 saturations with activity

## 2018-11-14 LAB — BASIC METABOLIC PANEL
Anion gap: 10 (ref 5–15)
BUN: 61 mg/dL — ABNORMAL HIGH (ref 8–23)
CO2: 25 mmol/L (ref 22–32)
Calcium: 9 mg/dL (ref 8.9–10.3)
Chloride: 103 mmol/L (ref 98–111)
Creatinine, Ser: 2.11 mg/dL — ABNORMAL HIGH (ref 0.61–1.24)
GFR calc Af Amer: 30 mL/min — ABNORMAL LOW (ref >60)
GFR calc non Af Amer: 25 mL/min — ABNORMAL LOW (ref >60)
Glucose, Bld: 128 mg/dL — ABNORMAL HIGH (ref 70–99)
Potassium: 4 mmol/L (ref 3.5–5.1)
Sodium: 138 mmol/L (ref 135–145)

## 2018-11-14 NOTE — Plan of Care (Signed)

## 2018-11-14 NOTE — Progress Notes (Signed)
PROGRESS NOTE    Lawrence Warner  KWI:[id number] DOB: 25-Jul-1918 DOA: 11/11/2018 PCP: Clovia Cuff, MD   Brief Narrative:  HPI per Dr. Jennette Kettle  Lawrence Warner is a 85 y.o. male with medical history significant of CKD stage 3, DM2, HTN, CAD s/p CABG, diastolic CHF.  Patient presents to the ED with c/o acute onset, progressively worsening L sided CP and SOB for past 2 days.  CP is worse with deep breaths, sharp in quality, L side of chest and radiates down into abdomen.  Associated orthopnea with SOB, no peripheral edema, no recent travel nor surgeries.  Satting 88% on RA, improved to 98% on 2L via .  No fever, nausea, vomiting, dysuria, diarrhea.   ED Course: Trops were 26 and 23.  BNP 454.9.  EKG shows LBBB with trigeminy (unchanged from priors).  CXR shows mild CHF findings (pulm edema, pulm vas congestion).  WBC nl, no fever, BP running high (242A-834H systolic).  BUN 30 and creat 1.69 (1.5 baseline).  Given 40mg  IV lasix and hospitalist asked to admit.  Assessment & Plan:   Principal Problem:   Acute on chronic diastolic CHF (congestive heart failure) (HCC) Active Problems:   Diet-controlled diabetes mellitus (HCC)   Hx of CABG   Essential hypertension   Acute respiratory failure with hypoxia (HCC)   CHF (congestive heart failure) (HCC)  1 acute respiratory failure with hypoxia Felt secondary to acute on chronic diastolic CHF exacerbation.  Patient with clinical improvement on diuretics.  Cardiac enzymes minimally elevated by flattened.  BNP at 454.9.  D-dimer 3.63.  Patient with a bump in creatinine on 11/13/2018.  IV Lasix with transition to oral Lasix and creatinine trending back down.  Creatinine currently at 2.11 from 2.22 from 1.61.  VQ scan with low probability for PE.  Continue oral Lasix at 20 mg daily.  Strict I's and O's.  Daily weights.  Follow.  2.  Acute on chronic diastolic CHF exacerbation Questionable etiology.  Troponin is minimally  elevated however seem to be flattened.  BNP of 454.9.  EKG with left bundle branch block with trigeminy unchanged from prior EKGs.  Patient with a urine output of 600 cc since admission.  2D echo  with a EF of 50 to 55%, left ventricular septal wall thickness mildly increased, mildly increased left ventricular posterior wall thickness, mildly increased left ventricular hypertrophy, diastolic dysfunction.  No wall motion abnormalities.  Patient with a bump in creatinine and as such IV Lasix was subsequently transition to oral Lasix.  Patient currently on Lasix 20 mg daily.  Creatinine trending back down.  Continue Cozaar.  Strict I's and O's.  Daily weights.  Follow.    3.  Hypertension Stable.  Continue Cozaar.  IV Lasix has been transitioned to oral Lasix.  Follow.    4.  Diet-controlled diabetes mellitus type 2 Hemoglobin A1c 7.1 on 10/03/2018.  Outpatient follow-up.  5.  History of coronary artery disease status post CABG Stable.  2D echo with a EF of 50 to 55%, left ventricular septal wall thickness mildly increased, mildly increased left ventricular posterior wall thickness, mildly increased left ventricular hypertrophy, diastolic dysfunction.  No wall motion abnormalities.  IV Lasix was discontinued and patient placed on oral Lasix.  Continue Cozaar, statin.  Outpatient follow-up.   DVT prophylaxis: Lovenox Code Status: DNR Family Communication: Updated patient.  No family at bedside. Disposition Plan: Likely home with home health therapies when medically improved, improvement with shortness of breath and CHF exacerbation hopefully  tomorrow.   Consultants:   None  Procedures:   VQ scan 11/12/2018  2D echo 11/12/2018  Chest x-ray 11/11/2018    Antimicrobials:   None   Subjective: Patient sleeping however arousable.  Few shortness of breath is improving daily.  Denies any further pleuritic chest pain.  No nausea or vomiting.   Objective: Vitals:   11/14/18 0933 11/14/18 1026  11/14/18 1027 11/14/18 1435  BP: (!) 126/48 (!) 130/49  138/62  Pulse: 79 74 94 69  Resp: 20   (!) 27  Temp:      TempSrc:      SpO2:  93% 94% 98%  Weight:      Height:        Intake/Output Summary (Last 24 hours) at 11/14/2018 1446 Last data filed at 11/14/2018 1406 Gross per 24 hour  Intake 1040 ml  Output 600 ml  Net 440 ml   Filed Weights   11/12/18 0458 11/13/18 0522 11/14/18 0503  Weight: 87.4 kg 86.5 kg 88 kg    Examination:  General exam: NAD Respiratory system: Lungs clear to auscultation bilaterally.  No wheezes, no crackles, no rhonchi.  Normal respiratory effort.  Speaking in full sentences.  Cardiovascular system: RRR no murmurs rubs or gallops.  No JVD.  No lower extremity edema.  Gastrointestinal system: Abdomen is nontender, nondistended, soft, positive bowel sounds.  No rebound.  No guarding.   Central nervous system: Alert and oriented. No focal neurological deficits. Extremities: Symmetric 5 x 5 power. Skin: No rashes, lesions or ulcers Psychiatry: Judgement and insight appear normal. Mood & affect appropriate.     Data Reviewed: I have personally reviewed following labs and imaging studies  CBC: Recent Labs  Lab 11/11/18 1821 11/13/18 0433  WBC 5.2 6.2  HGB 13.2 13.6  HCT 41.8 42.6  MCV 97.9 97.7  PLT 130* 094*   Basic Metabolic Panel: Recent Labs  Lab 11/11/18 1821 11/12/18 0512 11/13/18 0433 11/14/18 0403  NA 139 138 138 138  K 4.1 4.0 4.4 4.0  CL 105 108 105 103  CO2 25 23 23 25   GLUCOSE 135* 116* 122* 128*  BUN 30* 31* 49* 61*  CREATININE 1.69* 1.61* 2.22* 2.11*  CALCIUM 9.0 8.8* 9.0 9.0  MG  --   --  2.4  --    GFR: Estimated Creatinine Clearance: 22 mL/min (A) (by C-G formula based on SCr of 2.11 mg/dL (H)). Liver Function Tests: No results for input(s): AST, ALT, ALKPHOS, BILITOT, PROT, ALBUMIN in the last 168 hours. No results for input(s): LIPASE, AMYLASE in the last 168 hours. No results for input(s): AMMONIA in the  last 168 hours. Coagulation Profile: No results for input(s): INR, PROTIME in the last 168 hours. Cardiac Enzymes: No results for input(s): CKTOTAL, CKMB, CKMBINDEX, TROPONINI in the last 168 hours. BNP (last 3 results) No results for input(s): PROBNP in the last 8760 hours. HbA1C: No results for input(s): HGBA1C in the last 72 hours. CBG: No results for input(s): GLUCAP in the last 168 hours. Lipid Profile: No results for input(s): CHOL, HDL, LDLCALC, TRIG, CHOLHDL, LDLDIRECT in the last 72 hours. Thyroid Function Tests: No results for input(s): TSH, T4TOTAL, FREET4, T3FREE, THYROIDAB in the last 72 hours. Anemia Panel: No results for input(s): VITAMINB12, FOLATE, FERRITIN, TIBC, IRON, RETICCTPCT in the last 72 hours. Sepsis Labs: No results for input(s): PROCALCITON, LATICACIDVEN in the last 168 hours.  Recent Results (from the past 240 hour(s))  SARS Coronavirus 2 [facility] order, Performed in [facility] hospital lab) Nasopharyngeal Nasopharyngeal Swab     Status: None   Collection Time: 11/11/18  6:34 PM   Specimen: Nasopharyngeal Swab  Result Value Ref Range Status   SARS Coronavirus 2 NEGATIVE NEGATIVE Final    Comment: (NOTE) If result is NEGATIVE SARS-CoV-2 target nucleic acids are NOT DETECTED. The SARS-CoV-2 RNA is generally detectable in upper and lower  respiratory specimens during the acute phase of infection. The lowest  concentration of SARS-CoV-2 viral copies this assay can detect is 250  copies / mL. A negative result does not preclude SARS-CoV-2 infection  and should not be used as the sole basis for treatment or other  patient management decisions.  A negative result may occur with  improper specimen collection / handling, submission of specimen other  than nasopharyngeal swab, presence of viral mutation(s) within the  areas targeted by this assay, and inadequate number of viral copies  (<250 copies / mL). A negative result must be combined with clinical   observations, patient history, and epidemiological information. If result is POSITIVE SARS-CoV-2 target nucleic acids are DETECTED. The SARS-CoV-2 RNA is generally detectable in upper and lower  respiratory specimens dur ing the acute phase of infection.  Positive  results are indicative of active infection with SARS-CoV-2.  Clinical  correlation with patient history and other diagnostic information is  necessary to determine patient infection status.  Positive results do  not rule out bacterial infection or co-infection with other viruses. If result is PRESUMPTIVE POSTIVE SARS-CoV-2 nucleic acids MAY BE PRESENT.   A presumptive positive result was obtained on the submitted specimen  and confirmed on repeat testing.  While 2019 novel coronavirus  (SARS-CoV-2) nucleic acids may be present in the submitted sample  additional confirmatory testing may be necessary for epidemiological  and / or clinical management purposes  to differentiate between  SARS-CoV-2 and other Sarbecovirus currently known to infect humans.  If clinically indicated additional testing with an alternate test  methodology [phone]) is advised. The SARS-CoV-2 RNA is generally  detectable in upper and lower respiratory sp ecimens during the acute  phase of infection. The expected result is Negative. Fact Sheet for Patients:  StrictlyIdeas.no Fact Sheet for Healthcare Providers: BankingDealers.co.za This test is not yet approved or cleared by the [place] FDA and has been authorized for detection and/or diagnosis of SARS-CoV-2 by FDA under an Emergency Use Authorization (EUA).  This EUA will remain in effect (meaning this test can be used) for the duration of the COVID-19 declaration under Section 564(b)(1) of the Act, 21 U.S.C. section 360bbb-3(b)(1), unless the authorization is terminated or revoked sooner. Performed at [facility], [place]  [address], [place], [place] [address]          Radiology Studies: No results found.      Scheduled Meds: . brimonidine  1 drop Both Eyes TID  . cycloSPORINE  1 drop Both Eyes Q12H  . enoxaparin (LOVENOX) injection  30 mg Subcutaneous Q24H  . furosemide  20 mg Oral Daily  . losartan  100 mg Oral Daily  . magnesium oxide  400 mg Oral Daily  . multivitamin with minerals  1 tablet Oral Daily  . polyvinyl alcohol   Both Eyes QID  . simvastatin  20 mg Oral q1800  . sodium chloride flush  3 mL Intravenous Q12H   Continuous Infusions: . sodium chloride       LOS: 2 days    Time spent: 40 minutes  Irine Seal, MD [facility]  If 7PM-7AM, please contact night-coverage [email or web address] 11/14/2018, 2:46 PM

## 2018-11-15 LAB — BASIC METABOLIC PANEL
Anion gap: 9 (ref 5–15)
BUN: 67 mg/dL — ABNORMAL HIGH (ref 8–23)
CO2: 23 mmol/L (ref 22–32)
Calcium: 9.2 mg/dL (ref 8.9–10.3)
Chloride: 105 mmol/L (ref 98–111)
Creatinine, Ser: 1.85 mg/dL — ABNORMAL HIGH (ref 0.61–1.24)
GFR calc Af Amer: 35 mL/min — ABNORMAL LOW (ref >60)
GFR calc non Af Amer: 30 mL/min — ABNORMAL LOW (ref >60)
Glucose, Bld: 124 mg/dL — ABNORMAL HIGH (ref 70–99)
Potassium: 4.1 mmol/L (ref 3.5–5.1)
Sodium: 137 mmol/L (ref 135–145)

## 2018-11-15 NOTE — Progress Notes (Signed)
PROGRESS NOTE    Lawrence Warner  YIA:[id number] DOB: 06/27/1918 DOA: 11/11/2018 PCP: Clovia Cuff, MD   Brief Narrative:  HPI per Dr. Jennette Kettle  Lawrence Warner is a 85 y.o. male with medical history significant of CKD stage 3, DM2, HTN, CAD s/p CABG, diastolic CHF.  Patient presents to the ED with c/o acute onset, progressively worsening L sided CP and SOB for past 2 days.  CP is worse with deep breaths, sharp in quality, L side of chest and radiates down into abdomen.  Associated orthopnea with SOB, no peripheral edema, no recent travel nor surgeries.  Satting 88% on RA, improved to 98% on 2L via [place].  No fever, nausea, vomiting, dysuria, diarrhea.   ED Course: Trops were 26 and 23.  BNP 454.9.  EKG shows LBBB with trigeminy (unchanged from priors).  CXR shows mild CHF findings (pulm edema, pulm vas congestion).  WBC nl, no fever, BP running high (707E-675Q systolic).  BUN 30 and creat 1.69 (1.5 baseline).  Given 40mg  IV lasix and hospitalist asked to admit.  Assessment & Plan:   Principal Problem:   Acute on chronic diastolic CHF (congestive heart failure) (HCC) Active Problems:   Diet-controlled diabetes mellitus (HCC)   Hx of CABG   Essential hypertension   Acute respiratory failure with hypoxia (HCC)   CHF (congestive heart failure) (HCC)  1 acute respiratory failure with hypoxia Felt secondary to acute on chronic diastolic CHF exacerbation.  Patient with clinical improvement on diuretics.  Cardiac enzymes minimally elevated by flattened.  BNP at 454.9.  D-dimer 3.63.  Patient with a bump in creatinine on 11/13/2018.  IV Lasix has subsequently been transitioned to oral Lasix.  Creatinine trending back down and currently at 1.85 from 2.11 from 2.22 from 1.61.  VQ scan with low probability for PE.  Continue oral Lasix at 20 mg daily.  Strict I's and O's.  Daily weights.  Follow.  2.  Acute on chronic diastolic CHF exacerbation Questionable etiology.  Troponin  is minimally elevated however seem to be flattened.  BNP of 454.9.  EKG with left bundle branch block with trigeminy unchanged from prior EKGs.  Patient with a urine output of 775 cc since admission.  2D echo  with a EF of 50 to 55%, left ventricular septal wall thickness mildly increased, mildly increased left ventricular posterior wall thickness, mildly increased left ventricular hypertrophy, diastolic dysfunction.  No wall motion abnormalities.  Patient with a bump in creatinine and as such IV Lasix was subsequently transition to oral Lasix.  Patient currently on Lasix 20 mg daily.  Creatinine trending back down.  Continue Cozaar.  Strict I's and O's.  Daily weights.  Follow.    3.  Hypertension Blood pressure stable on current regimen of Cozaar and Lasix.  Outpatient follow-up.   4.  Diet-controlled diabetes mellitus type 2 Hemoglobin A1c 7.1 on 10/03/2018.  Outpatient follow-up.  5.  History of coronary artery disease status post CABG Stable.  2D echo with a EF of 50 to 55%, left ventricular septal wall thickness mildly increased, mildly increased left ventricular posterior wall thickness, mildly increased left ventricular hypertrophy, diastolic dysfunction.  No wall motion abnormalities.  IV Lasix was discontinued and patient placed on oral Lasix.  Continue Cozaar, statin.  Outpatient follow-up.   DVT prophylaxis: Lovenox Code Status: DNR Family Communication: Updated patient.  No family at bedside. Disposition Plan: Likely home with home health therapies when medically improved, improvement with shortness of breath and CHF exacerbation hopefully  tomorrow.   Consultants:   None  Procedures:   VQ scan 11/12/2018  2D echo 11/12/2018  Chest x-ray 11/11/2018    Antimicrobials:   None   Subjective: Patient sleeping however easily arousable.  Feels shortness of breath improving.  Denies any further chest pain.  No nausea or vomiting.    Objective: Vitals:   11/14/18 2018 11/15/18  0455 11/15/18 0500 11/15/18 1351  BP: (!) 110/59 (!) 143/47  139/88  Pulse: 69 60  82  Resp:  20  20  Temp:  97.7 F (36.5 C)  (!) 97.5 F (36.4 C)  TempSrc:  Oral  Oral  SpO2: 91% 96%  100%  Weight:   89.3 kg   Height:        Intake/Output Summary (Last 24 hours) at 11/15/2018 1658 Last data filed at 11/15/2018 1612 Gross per 24 hour  Intake 725 ml  Output 600 ml  Net 125 ml   Filed Weights   11/13/18 0522 11/14/18 0503 11/15/18 0500  Weight: 86.5 kg 88 kg 89.3 kg    Examination:  General exam: NAD Respiratory system: CTAB.  No wheezes, no crackles, no rhonchi.  Normal respiratory effort.  Cardiovascular system: Regular rate and rhythm no murmurs rubs or gallops.  No JVD.  No lower extremity edema.  Gastrointestinal system: Abdomen is soft, nontender, nondistended, positive bowel sounds.  No rebound.  No guarding.    Central nervous system: Alert and oriented. No focal neurological deficits. Extremities: Symmetric 5 x 5 power. Skin: No rashes, lesions or ulcers Psychiatry: Judgement and insight appear normal. Mood & affect appropriate.     Data Reviewed: I have personally reviewed following labs and imaging studies  CBC: Recent Labs  Lab 11/11/18 1821 11/13/18 0433  WBC 5.2 6.2  HGB 13.2 13.6  HCT 41.8 42.6  MCV 97.9 97.7  PLT 130* 101*   Basic Metabolic Panel: Recent Labs  Lab 11/11/18 1821 11/12/18 0512 11/13/18 0433 11/14/18 0403 11/15/18 0509  NA 139 138 138 138 137  K 4.1 4.0 4.4 4.0 4.1  CL 105 108 105 103 105  CO2 25 23 23 25 23   GLUCOSE 135* 116* 122* 128* 124*  BUN 30* 31* 49* 61* 67*  CREATININE 1.69* 1.61* 2.22* 2.11* 1.85*  CALCIUM 9.0 8.8* 9.0 9.0 9.2  MG  --   --  2.4  --   --    GFR: Estimated Creatinine Clearance: 25.2 mL/min (A) (by C-G formula based on SCr of 1.85 mg/dL (H)). Liver Function Tests: No results for input(s): AST, ALT, ALKPHOS, BILITOT, PROT, ALBUMIN in the last 168 hours. No results for input(s): LIPASE, AMYLASE  in the last 168 hours. No results for input(s): AMMONIA in the last 168 hours. Coagulation Profile: No results for input(s): INR, PROTIME in the last 168 hours. Cardiac Enzymes: No results for input(s): CKTOTAL, CKMB, CKMBINDEX, TROPONINI in the last 168 hours. BNP (last 3 results) No results for input(s): PROBNP in the last 8760 hours. HbA1C: No results for input(s): HGBA1C in the last 72 hours. CBG: No results for input(s): GLUCAP in the last 168 hours. Lipid Profile: No results for input(s): CHOL, HDL, LDLCALC, TRIG, CHOLHDL, LDLDIRECT in the last 72 hours. Thyroid Function Tests: No results for input(s): TSH, T4TOTAL, FREET4, T3FREE, THYROIDAB in the last 72 hours. Anemia Panel: No results for input(s): VITAMINB12, FOLATE, FERRITIN, TIBC, IRON, RETICCTPCT in the last 72 hours. Sepsis Labs: No results for input(s): PROCALCITON, LATICACIDVEN in the last 168 hours.  Recent Results (  from the past 240 hour(s))  SARS Coronavirus 2 [facility] order, Performed in [facility] hospital lab) Nasopharyngeal Nasopharyngeal Swab     Status: None   Collection Time: 11/11/18  6:34 PM   Specimen: Nasopharyngeal Swab  Result Value Ref Range Status   SARS Coronavirus 2 NEGATIVE NEGATIVE Final    Comment: (NOTE) If result is NEGATIVE SARS-CoV-2 target nucleic acids are NOT DETECTED. The SARS-CoV-2 RNA is generally detectable in upper and lower  respiratory specimens during the acute phase of infection. The lowest  concentration of SARS-CoV-2 viral copies this assay can detect is 250  copies / mL. A negative result does not preclude SARS-CoV-2 infection  and should not be used as the sole basis for treatment or other  patient management decisions.  A negative result may occur with  improper specimen collection / handling, submission of specimen other  than nasopharyngeal swab, presence of viral mutation(s) within the  areas targeted by this assay, and inadequate number of viral copies  (<250  copies / mL). A negative result must be combined with clinical  observations, patient history, and epidemiological information. If result is POSITIVE SARS-CoV-2 target nucleic acids are DETECTED. The SARS-CoV-2 RNA is generally detectable in upper and lower  respiratory specimens dur ing the acute phase of infection.  Positive  results are indicative of active infection with SARS-CoV-2.  Clinical  correlation with patient history and other diagnostic information is  necessary to determine patient infection status.  Positive results do  not rule out bacterial infection or co-infection with other viruses. If result is PRESUMPTIVE POSTIVE SARS-CoV-2 nucleic acids MAY BE PRESENT.   A presumptive positive result was obtained on the submitted specimen  and confirmed on repeat testing.  While 2019 novel coronavirus  (SARS-CoV-2) nucleic acids may be present in the submitted sample  additional confirmatory testing may be necessary for epidemiological  and / or clinical management purposes  to differentiate between  SARS-CoV-2 and other Sarbecovirus currently known to infect humans.  If clinically indicated additional testing with an alternate test  methodology [phone]) is advised. The SARS-CoV-2 RNA is generally  detectable in upper and lower respiratory sp ecimens during the acute  phase of infection. The expected result is Negative. Fact Sheet for Patients:  StrictlyIdeas.no Fact Sheet for Healthcare Providers: BankingDealers.co.za This test is not yet approved or cleared by the [place] FDA and has been authorized for detection and/or diagnosis of SARS-CoV-2 by FDA under an Emergency Use Authorization (EUA).  This EUA will remain in effect (meaning this test can be used) for the duration of the COVID-19 declaration under Section 564(b)(1) of the Act, 21 U.S.C. section 360bbb-3(b)(1), unless the authorization is terminated or revoked  sooner. Performed at [facility], [place] [address], [place], [place] [address]          Radiology Studies: No results found.      Scheduled Meds: . brimonidine  1 drop Both Eyes TID  . cycloSPORINE  1 drop Both Eyes Q12H  . enoxaparin (LOVENOX) injection  30 mg Subcutaneous Q24H  . furosemide  20 mg Oral Daily  . losartan  100 mg Oral Daily  . magnesium oxide  400 mg Oral Daily  . multivitamin with minerals  1 tablet Oral Daily  . polyvinyl alcohol   Both Eyes QID  . simvastatin  20 mg Oral q1800  . sodium chloride flush  3 mL Intravenous Q12H   Continuous Infusions: . sodium chloride  LOS: 3 days    Time spent: 35 minutes    Irine Seal, MD [facility]  If 7PM-7AM, please contact night-coverage [email or web address] 11/15/2018, 4:58 PM

## 2018-11-15 NOTE — Progress Notes (Signed)
PT NOTE  Pt would benefit from hospital bed at home given his CHF dx, associated SOB as well as generalized weakness making him reliant on bed rails to come to sitting position.   Kenyon Ana, PT  Pager: [phone] Acute Rehab Dept [facility]): [phone]   11/15/2018

## 2018-11-16 LAB — BASIC METABOLIC PANEL
Anion gap: 11 (ref 5–15)
BUN: 76 mg/dL — ABNORMAL HIGH (ref 8–23)
CO2: 21 mmol/L — ABNORMAL LOW (ref 22–32)
Calcium: 9.2 mg/dL (ref 8.9–10.3)
Chloride: 105 mmol/L (ref 98–111)
Creatinine, Ser: 2.04 mg/dL — ABNORMAL HIGH (ref 0.61–1.24)
GFR calc Af Amer: 31 mL/min — ABNORMAL LOW (ref >60)
GFR calc non Af Amer: 27 mL/min — ABNORMAL LOW (ref >60)
Glucose, Bld: 120 mg/dL — ABNORMAL HIGH (ref 70–99)
Potassium: 4.4 mmol/L (ref 3.5–5.1)
Sodium: 137 mmol/L (ref 135–145)

## 2018-11-16 MED ORDER — FUROSEMIDE 20 MG PO TABS: 20.0000 mg | ORAL_TABLET | Freq: Every day | ORAL | 1 refills | Status: DC

## 2018-11-16 NOTE — Progress Notes (Addendum)
Discharge instructions reviewed with patient and son [name]. Questions, concerns related to [facility]. Will contact CM.  No changes from am assessment.

## 2018-11-16 NOTE — Discharge Summary (Signed)
Physician Discharge Summary  Lawrence Warner VZD:[id number] DOB: Dec 17, 1918 DOA: 11/11/2018  PCP: Clovia Cuff, MD  Admit date: 11/11/2018 Discharge date: 11/16/2018  Time spent: 50 minutes  Recommendations for Outpatient Follow-up:  1. Follow-up with Clovia Cuff, MD in 1 week.  On follow-up patient need a basic metabolic profile done to follow-up on electrolytes and renal function. 2. Patient be discharged back to independent living facility with home health therapies.   Discharge Diagnoses:  Principal Problem:   Acute on chronic diastolic CHF (congestive heart failure) (HCC) Active Problems:   Diet-controlled diabetes mellitus (HCC)   Hx of CABG   Essential hypertension   Acute respiratory failure with hypoxia (HCC)   CHF (congestive heart failure) ([place])   Discharge Condition: Stable and improved  Diet recommendation: Heart healthy  Filed Weights   11/14/18 0503 11/15/18 0500 11/16/18 0409  Weight: 88 kg 89.3 kg 87.2 kg    History of present illness:  HPI per Dr. Deveron Furlong is a 85 y.o. male with medical history significant of CKD stage 3, DM2, HTN, CAD s/p CABG, diastolic CHF.  Patient presents to the ED with c/o acute onset, progressively worsening L sided CP and SOB for past 2 days.  CP is worse with deep breaths, sharp in quality, L side of chest and radiates down into abdomen.  Associated orthopnea with SOB, no peripheral edema, no recent travel nor surgeries.  Satting 88% on RA, improved to 98% on 2L via [place].  No fever, nausea, vomiting, dysuria, diarrhea.   ED Course: Trops were 26 and 23.  BNP 454.9.  EKG shows LBBB with trigeminy (unchanged from priors).  CXR shows mild CHF findings (pulm edema, pulm vas congestion).  WBC nl, no fever, BP running high (295J-884Z systolic).  BUN 30 and creat 1.69 (1.5 baseline).  Given 40mg  IV lasix and hospitalist asked to admit.  Hospital Course:  1 acute respiratory failure with hypoxia Felt  secondary to acute on chronic diastolic CHF exacerbation.  Patient was admitted placed on IV Lasix for acute diastolic CHF exacerbation.  Patient diuresed during the hospitalization.  Patient improved clinically. Cardiac enzymes minimally elevated by flattened.  BNP at 454.9.  D-dimer 3.63.  Patient with a bump in creatinine on 11/13/2018.  IV Lasix was subsequently  transitioned to oral Lasix.  Creatinine trended back down and was at 2.04 on day of discharge from 1.85 from 2.11 from 2.22 from 1.61.  VQ scan with low probability for PE.  Patient was transitioned to oral Lasix which will be discharged home on Lasix 20 mg daily.  Outpatient follow-up with PCP.   2.  Acute on chronic diastolic CHF exacerbation Questionable etiology.  Troponin was minimally elevated however seemed to be flattened.  BNP of 454.9.  EKG with left bundle branch block with trigeminy unchanged from prior EKGs.  Patient with a urine output of 775 cc since admission.  2D echo  with a EF of 50 to 55%, left ventricular septal wall thickness mildly increased, mildly increased left ventricular posterior wall thickness, mildly increased left ventricular hypertrophy, diastolic dysfunction.  No wall motion abnormalities.  Patient initially placed on IV Lasix with good diuresis and clinical improvement. Patient with a bump in creatinine and as such IV Lasix was subsequently transition to oral Lasix.  Patient improved clinically had no further shortness of breath and was euvolemic by day of discharge.  Patient was also maintained on home regimen of Cozaar.  Outpatient follow-up with PCP.  3.  Hypertension Blood  pressure remained stable during the hospitalization.  Patient was maintained on home regimen of Cozaar and patient was started on Lasix for CHF exacerbation.  Patient be discharged home on Cozaar and oral Lasix.  Outpatient follow-up.    4.  Diet-controlled diabetes mellitus type 2 Hemoglobin A1c 7.1 on 10/03/2018.  Outpatient  follow-up.  5.  History of coronary artery disease status post CABG Stable.  2D echo with a EF of 50 to 55%, left ventricular septal wall thickness mildly increased, mildly increased left ventricular posterior wall thickness, mildly increased left ventricular hypertrophy, diastolic dysfunction.  No wall motion abnormalities.  Patient was placed on IV Lasix for acute CHF exacerbation with good diuresis and subsequently transition to oral Lasix at 20 mg daily.  Patient maintained on home regimen of Cozaar and statin.  Outpatient follow-up with PCP.    Procedures:  VQ scan 11/12/2018  2D echo 11/12/2018  Chest x-ray 11/11/2018  Consultations:  None  Discharge Exam: Vitals:   11/16/18 0532 11/16/18 1313  BP: (!) 143/48 (!) 156/48  Pulse: (!) 55 70  Resp: 16 18  Temp: 97.9 F (36.6 C)   SpO2: 96% 95%    General: nad Cardiovascular: rrr Respiratory: ctab  Discharge Instructions   Discharge Instructions    Diet - low sodium heart healthy   Complete by: As directed    Increase activity slowly   Complete by: As directed      Allergies as of 11/16/2018      Reactions   Alfuzosin Hcl Er Other (See Comments)   Confusion/Uroxatral   Imdur [isosorbide Dinitrate] Other (See Comments)   REACTION NOT RECALLED   Penicillins Hives, Other (See Comments)   Has patient had a PCN reaction causing immediate rash, facial/tongue/throat swelling, SOB or lightheadedness with hypotension: Yes Has patient had a PCN reaction causing severe rash involving mucus membranes or skin necrosis: Unknown Has patient had a PCN reaction that required hospitalization: Unknown Has patient had a PCN reaction occurring within the last 10 years: Unknown If all of the above answers are "NO", then may proceed with Cephalosporin use.   Tape Other (See Comments)   SKIN IS VERY THIN AND WILL TEAR EASILY      Medication List    TAKE these medications   acetaminophen 500 MG tablet Commonly known as:  TYLENOL Take 1,500 mg by mouth daily.   ARTIFICIAL TEARS PF OP Place 1 drop into both eyes 4 (four) times daily.   brimonidine 0.2 % ophthalmic solution Commonly known as: ALPHAGAN Place 1 drop into both eyes 3 (three) times daily.   cycloSPORINE 0.05 % ophthalmic emulsion Commonly known as: RESTASIS Place 1 drop into both eyes every 12 (twelve) hours.   furosemide 20 MG tablet Commonly known as: LASIX Take 1 tablet (20 mg total) by mouth daily. Start taking on: November 17, 2018   hydroxypropyl methylcellulose / hypromellose 2.5 % ophthalmic solution Commonly known as: ISOPTO TEARS / GONIOVISC Place 1 drop into both eyes 3 (three) times daily as needed for dry eyes.   losartan 100 MG tablet Commonly known as: COZAAR Take 100 mg by mouth daily.   magnesium oxide 400 MG tablet Commonly known as: MAG-OX Take 400 mg daily by mouth.   multivitamin with minerals Tabs tablet Take 1 tablet by mouth daily.   simvastatin 20 MG tablet Commonly known as: ZOCOR Take 20 mg by mouth daily.            Durable Medical Equipment  (From admission,  onward)         Start     Ordered   11/14/18 1241  For home use only DME Hospital bed  Once    Question Answer Comment  Length of Need Lifetime   Patient has (list medical condition): chf   The above medical condition requires: Patient requires the ability to reposition frequently   Head must be elevated greater than: 45 degrees   Bed type Semi-electric   Trapeze Bar Yes   Support Surface: Gel Overlay      11/14/18 1241   11/13/18 1628  For home use only DME 3 n 1  Once     11/13/18 1628         Allergies  Allergen Reactions  . Alfuzosin Hcl Er Other (See Comments)    Confusion/Uroxatral   . Imdur [Isosorbide Dinitrate] Other (See Comments)    REACTION NOT RECALLED  . Penicillins Hives and Other (See Comments)    Has patient had a PCN reaction causing immediate rash, facial/tongue/throat swelling, SOB or  lightheadedness with hypotension: Yes Has patient had a PCN reaction causing severe rash involving mucus membranes or skin necrosis: Unknown Has patient had a PCN reaction that required hospitalization: Unknown Has patient had a PCN reaction occurring within the last 10 years: Unknown If all of the above answers are "NO", then may proceed with Cephalosporin use.   . Tape Other (See Comments)    SKIN IS VERY THIN AND WILL TEAR EASILY   Follow-up Information    [facility] Follow up.   Specialty: [place] Why: [facility] physical therapy/occupational therapy Contact information: [place] [address] [phone]        [facility] Follow up.   Why: hospital bed Contact information: [address] [place] [address] [phone]        Clovia Cuff, MD. Schedule an appointment as soon as possible for a visit in 1 week(s).   Specialty: Internal Medicine Why: F/U IN 1 WEEK. Contact information: [place] [address] [phone]        Minus Breeding, MD .   Specialty: Cardiology Contact information: [address] [place] [address] [phone]            The results of significant diagnostics from this hospitalization (including imaging, microbiology, ancillary and laboratory) are listed below for reference.    Significant Diagnostic Studies: Dg Chest 2 View  Result Date: 11/11/2018 CLINICAL DATA:  85 year old male with chest pain and shortness of breath on the left for the past 2 days. EXAM: CHEST - 2 VIEW COMPARISON:  Prior chest x-ray 02/06/2018 FINDINGS: Stable mild cardiomegaly. Patient is status post median sternotomy with evidence of prior multivessel CABG. Slight interval increase in pulmonary vascular congestion now bordering on mild interstitial edema. Small left pleural effusion. No pneumothorax. No focal airspace infiltrate. No acute  osseous abnormality. Band 2 spine suggests ankylosing spondylosis. IMPRESSION: 1. Mild CHF. 2. Small left pleural effusion. 3. Ankylosing spondylosis. Electronically Signed   By: Jacqulynn Cadet M.D.   On: 11/11/2018 19:28   Nm Pulmonary Perf And Vent  Result Date: 11/12/2018 CLINICAL DATA:  Shortness of breath and chest pain EXAM: NUCLEAR MEDICINE VENTILATION - PERFUSION LUNG SCAN VIEWS: Anterior, posterior, left lateral, right lateral, RPO, LPO, RAO, LAO-ventilation and perfusion RADIOPHARMACEUTICALS:  31.0 6 mCi of Tc-71m DTPA aerosol inhalation and mCi Tc38m MAA IV COMPARISON:  Chest radiograph November 11, 2018 FINDINGS: Ventilation: There are foci of decreased uptake in each upper lobe, more focal in the posterior segment left lobe region on the left than on the right. The remainder the ventilation study appears unremarkable. Perfusion: There are upper lobe areas of photopenia, more notable on the left than on the right, which match ventilation defects. No appreciable ventilation/perfusion mismatch. IMPRESSION: Matching upper lobe defects, larger on the left than on the right. No appreciable ventilation/perfusion mismatch. This study constitutes an overall low probability of pulmonary embolus based on PIOPED II criteria. Electronically Signed   By: Lowella Grip III M.D.   On: 11/12/2018 16:15    Microbiology: Recent Results (from the past 240 hour(s))  SARS Coronavirus 2 [facility] order, Performed in [facility] hospital lab) Nasopharyngeal Nasopharyngeal Swab     Status: None   Collection Time: 11/11/18  6:34 PM   Specimen: Nasopharyngeal Swab  Result Value Ref Range Status   SARS Coronavirus 2 NEGATIVE NEGATIVE Final    Comment: (NOTE) If result is NEGATIVE SARS-CoV-2 target nucleic acids are NOT DETECTED. The SARS-CoV-2 RNA is generally detectable in upper and lower  respiratory specimens during the acute phase of infection. The lowest  concentration of SARS-CoV-2 viral copies  this assay can detect is 250  copies / mL. A negative result does not preclude SARS-CoV-2 infection  and should not be used as the sole basis for treatment or other  patient management decisions.  A negative result may occur with  improper specimen collection / handling, submission of specimen other  than nasopharyngeal swab, presence of viral mutation(s) within the  areas targeted by this assay, and inadequate number of viral copies  (<250 copies / mL). A negative result must be combined with clinical  observations, patient history, and epidemiological information. If result is POSITIVE SARS-CoV-2 target nucleic acids are DETECTED. The SARS-CoV-2 RNA is generally detectable in upper and lower  respiratory specimens dur ing the acute phase of infection.  Positive  results are indicative of active infection with SARS-CoV-2.  Clinical  correlation with patient history and other diagnostic information is  necessary to determine patient infection status.  Positive results do  not rule out bacterial infection or co-infection with other viruses. If result is PRESUMPTIVE POSTIVE SARS-CoV-2 nucleic acids MAY BE PRESENT.   A presumptive positive result was obtained on the submitted specimen  and confirmed on repeat testing.  While 2019 novel coronavirus  (SARS-CoV-2) nucleic acids may be present in the submitted sample  additional confirmatory testing may be necessary for epidemiological  and / or clinical management purposes  to differentiate between  SARS-CoV-2 and other Sarbecovirus currently known to infect humans.  If clinically indicated additional testing with an alternate test  methodology [phone]) is advised. The SARS-CoV-2 RNA is generally  detectable in upper and lower respiratory sp ecimens during the acute  phase of infection. The expected result is Negative. Fact Sheet for Patients:  StrictlyIdeas.no Fact Sheet for Healthcare  Providers: BankingDealers.co.za This test is not yet approved or cleared by the [place] FDA and has been authorized for detection and/or diagnosis of SARS-CoV-2 by FDA under an Emergency Use Authorization (EUA).  This EUA will remain in effect (meaning this test can be used) for the duration of the COVID-19 declaration under Section 564(b)(1) of the Act, 21 U.S.C. section 360bbb-3(b)(1), unless the authorization is terminated or revoked sooner. Performed at [facility], [place] [address] [place], [place] [address]      Labs: Basic  Metabolic Panel: Recent Labs  Lab 11/12/18 0512 11/13/18 0433 11/14/18 0403 11/15/18 0509 11/16/18 0509  NA 138 138 138 137 137  K 4.0 4.4 4.0 4.1 4.4  CL 108 105 103 105 105  CO2 23 23 25 23  21*  GLUCOSE 116* 122* 128* 124* 120*  BUN 31* 49* 61* 67* 76*  CREATININE 1.61* 2.22* 2.11* 1.85* 2.04*  CALCIUM 8.8* 9.0 9.0 9.2 9.2  MG  --  2.4  --   --   --    Liver Function Tests: No results for input(s): AST, ALT, ALKPHOS, BILITOT, PROT, ALBUMIN in the last 168 hours. No results for input(s): LIPASE, AMYLASE in the last 168 hours. No results for input(s): AMMONIA in the last 168 hours. CBC: Recent Labs  Lab 11/11/18 1821 11/13/18 0433  WBC 5.2 6.2  HGB 13.2 13.6  HCT 41.8 42.6  MCV 97.9 97.7  PLT 130* 121*   Cardiac Enzymes: No results for input(s): CKTOTAL, CKMB, CKMBINDEX, TROPONINI in the last 168 hours. BNP: BNP (last 3 results) Recent Labs    11/11/18 1821  BNP 454.9*    ProBNP (last 3 results) No results for input(s): PROBNP in the last 8760 hours.  CBG: No results for input(s): GLUCAP in the last 168 hours.     Signed:  Irine Seal MD.  [facility] 11/16/2018, 2:27 PM

## 2018-11-16 NOTE — Care Management Important Message (Signed)
Important Message  Patient Details IM Letter given to [name] RN to present to the Patient Name: Lawrence Warner MRN: [id number] Date of Birth: [birth year]   Medicare Important Message Given:  Yes     Kerin Salen 11/16/2018, 12:03 PM

## 2018-11-16 NOTE — Progress Notes (Signed)
Patients oxygen saturation assessed at res on room air 93 %.  Patient ambulated from bed into hallway with walker. Pt  tolerated activity well. O2 sat on room decreased 89%. When at rest sat increased 92-93%.

## 2018-11-16 NOTE — TOC Progression Note (Signed)
Transition of Care [facility]) - Progression Note    Patient Details  Name: Lawrence Warner MRN: [id number] Date of Birth: Jan 10, 1919  Transition of Care [facility]) CM/SW Contact  Joaquin Courts, RN Phone Number: 11/16/2018, 4:38 PM  Clinical Narrative:  CM confirmed with [employer] that hospital bed is scheduled for delivery between 5 and 8 pm. Patient to be transported by family at discharge.      Expected Discharge Plan: [place] Barriers to Discharge: Continued Medical Work up  Expected Discharge Plan and Services Expected Discharge Plan: [place]   Discharge Planning Services: CM Consult Post Acute Care Choice: [place] Living arrangements for the past 2 months: [place] Expected Discharge Date: 11/16/18                         HH Arranged: PT, OT HH Agency: [place] Date [facility] Agency Contacted: 11/13/18 Time [place]: 1233 Representative spoke with at [place]: [place] ([place]) Interventions    Readmission Risk Interventions No flowsheet data found.

## 2018-11-16 NOTE — Progress Notes (Signed)
Follow up call placed, Bed has not arrived.  Provider updated.

## 2018-11-16 NOTE — Progress Notes (Addendum)
[facility] contacted [phone].  Spoke to Amy whom reports delivery company [employer] has not contacted Facility to arrange time for bed delivery. Equipment has not been delivered. Will hold discharge for now and follow up.

## 2018-11-16 NOTE — Care Management (Cosign Needed)
    Durable Medical Equipment  (From admission, onward)         Start     Ordered   11/14/18 1241  For home use only DME Hospital bed  Once    Question Answer Comment  Length of Need Lifetime   Patient has (list medical condition): chf   The above medical condition requires: Patient requires the ability to reposition frequently   Head must be elevated greater than: 45 degrees   Bed type Semi-electric   Trapeze Bar Yes   Support Surface: Gel Overlay      11/14/18 1241   11/13/18 1628  For home use only DME 3 n 1  Once     11/13/18 1628

## 2018-11-16 NOTE — TOC Progression Note (Signed)
Transition of Care [facility]) - Progression Note    Patient Details  Name: Broady Lafoy MRN: [id number] Date of Birth: December 20, 1918  Transition of Care [facility]) CM/SW Contact  Jakita Dutkiewicz, Juliann Pulse, RN Phone Number: 11/16/2018, 10:58 AM  Clinical Narrative:d/c today Alvis Lemmings HHPT/OT-rep [facility] aware. dme-[employer] rep Zack following to contact son Elnora Morrison for delivery of hospital bed to [facility] liv-bed must be there prior to patient d/c. Family to set up 24hr asst-independently with [facility] services. Family to transport home on own.       Expected Discharge Plan: [place] Barriers to Discharge: Continued Medical Work up  Expected Discharge Plan and Services Expected Discharge Plan: [place]   Discharge Planning Services: CM Consult Post Acute Care Choice: [place] Living arrangements for the past 2 months: [place]                           HH Arranged: PT, OT [facility] Agency: [place] Date [place]: 11/13/18 Time [place]: 7225 Representative spoke with at [place]: [place] ([place]) Interventions    Readmission Risk Interventions No flowsheet data found.

## 2018-11-16 NOTE — Progress Notes (Signed)
Physical Therapy Treatment Patient Details Name: Lawrence Warner MRN: [id number] DOB: Oct 17, 1918 Today's Date: 11/16/2018    History of Present Illness 85 y.o. male admitted with SOB,  and L sided CP--> acute on chronic CHF. VQ scan negative for PE. medical history: CKD stage 3, DM2, HTN, CAD s/p CABG, diastolic CHF.    PT Comments    Pt participated fairly well with min encouragement. SpO2: at rest- 93% on RA, with activity-88% on RA. Dyspnea 2/4. Will continue to follow and progress activity as tolerated.   Follow Up Recommendations  Home health PT;Supervision - Intermittent(pt may need increased assistance at home initially)     Equipment Recommendations  Hospital bed    Recommendations for Other Services       Precautions / Restrictions Precautions Precautions: Fall Restrictions Weight Bearing Restrictions: No    Mobility  Bed Mobility Overal bed mobility: Needs Assistance Bed Mobility: Supine to Sit     Supine to sit: HOB elevated;Min guard     General bed mobility comments: Increased time and effort for pt. He maximally elevated HOB in order to be able to get to EOB.  Transfers Overall transfer level: Needs assistance Equipment used: 4-wheeled walker Transfers: Sit to/from Stand Sit to Stand: Min assist         General transfer comment: Assist to rise, steady. Increased time. VCs safety, hand placement.  Ambulation/Gait Ambulation/Gait assistance: Min assist Gait Distance (Feet): 50 Feet Assistive device: 4-wheeled walker Gait Pattern/deviations: Step-through pattern;Decreased stride length;Trunk flexed     General Gait Details: Slow gait speed. Pt fatigues easily. SpO2 88% on RA with short walk. Dyspnea 2/4.   Stairs             Wheelchair Mobility    Modified Rankin (Stroke Patients Only)       Balance Overall balance assessment: Needs assistance           Standing balance-Leahy Scale: Fair                               Cognition Arousal/Alertness: Awake/alert Behavior During Therapy: WFL for tasks assessed/performed Overall Cognitive Status: Within Functional Limits for tasks assessed                                        Exercises      General Comments        Pertinent Vitals/Pain Pain Assessment: No/denies pain    Home Living                      Prior Function            PT Goals (current goals can now be found in the care plan section) Progress towards PT goals: Progressing toward goals    Frequency    Min 3X/week      PT Plan Current plan remains appropriate    Co-evaluation              AM-PAC PT "6 Clicks" Mobility   Outcome Measure  Help needed turning from your back to your side while in a flat bed without using bedrails?: A Little Help needed moving from lying on your back to sitting on the side of a flat bed without using bedrails?: A Little Help needed moving to and from a bed to a chair (including a  wheelchair)?: A Little Help needed standing up from a chair using your arms (e.g., wheelchair or bedside chair)?: A Little Help needed to walk in hospital room?: A Little Help needed climbing 3-5 steps with a railing? : A Lot 6 Click Score: 17    End of Session Equipment Utilized During Treatment: Gait belt Activity Tolerance: Patient limited by fatigue Patient left: in bed;with call bell/phone within reach;with bed alarm set   PT Visit Diagnosis: Unsteadiness on feet (R26.81);Difficulty in walking, not elsewhere classified (R26.2)     Time: 1025-1039 PT Time Calculation (min) (ACUTE ONLY): 14 min  Charges:  $Gait Training: 8-22 mins                       Weston Anna, PT Acute Rehabilitation Services Pager: [phone] Office: [phone]

## 2018-11-16 NOTE — Progress Notes (Signed)
Occupational Therapy Treatment Patient Details Name: Lawrence Warner MRN: [id number] DOB: 07-Feb-1919 Today's Date: 11/16/2018    History of present illness 85 y.o. male admitted with SOB,  and L sided CP--> acute on chronic CHF. VQ scan negative for PE. medical history: CKD stage 3, DM2, HTN, CAD s/p CABG, diastolic CHF.   OT comments  Pt very kind and motivated however do feel pt would benefit from some A with ADL activity upon return to his ILF apartment.  Pt did agree    Follow Up Recommendations  Home health OT(assist for mobility/adls)    Equipment Recommendations  3 in 1 bedside commode(? 3:1 )    Recommendations for Other Services      Precautions / Restrictions Precautions Precautions: Fall Restrictions Weight Bearing Restrictions: No       Mobility Bed Mobility Overal bed mobility: Needs Assistance Bed Mobility: Supine to Sit     Supine to sit: HOB elevated;Min guard     General bed mobility comments: Increased time and effort for pt. He maximally elevated HOB in order to be able to get to EOB.  Transfers Overall transfer level: Needs assistance Equipment used: Rolling walker (2 wheeled) Transfers: Sit to/from [facility] Sit to Stand: Min assist Stand pivot transfers: Min assist       General transfer comment: Assist to rise, steady. Increased time. VCs safety, hand placement.    Balance Overall balance assessment: Needs assistance           Standing balance-Leahy Scale: Fair                             ADL either performed or assessed with clinical judgement   ADL Overall ADL's : Needs assistance/impaired     Grooming: Min guard;Standing;Cueing for safety;Oral care;Wash/dry hands;Wash/dry Sports administrator: Ambulation;Cueing for safety;Cueing for sequencing;Min Psychiatric nurse Details (indicate cue type and reason): to chair after grooming activity           General  ADL Comments: pt plans to have some A at his ILF if he needs.  OT does reccomend some A initially with his ADL activity.  Pt verbalized understanding and agreed     Vision Patient Visual Report: No change from baseline            Cognition Arousal/Alertness: Awake/alert Behavior During Therapy: WFL for tasks assessed/performed Overall Cognitive Status: Within Functional Limits for tasks assessed                                                     Pertinent Vitals/ Pain       Pain Assessment: No/denies pain         Frequency  Min 2X/week        Progress Toward Goals  OT Goals(current goals can now be found in the care plan section)  Progress towards OT goals: Progressing toward goals  Acute Rehab OT Goals Patient Stated Goal: back to [facility] OT Goal Formulation: With patient Time For Goal Achievement: 11/27/18 Potential to Achieve Goals: Good  Plan         AM-PAC OT "6 Clicks" Daily Activity     Outcome Measure  Help from another person eating meals?: A Little Help from another person taking care of personal grooming?: A Little Help from another person toileting, which includes using toliet, bedpan, or urinal?: A Little Help from another person bathing (including washing, rinsing, drying)?: A Little Help from another person to put on and taking off regular upper body clothing?: A Little Help from another person to put on and taking off regular lower body clothing?: A Little 6 Click Score: 18    End of Session Equipment Utilized During Treatment: Gait belt  OT Visit Diagnosis: Unsteadiness on feet (R26.81);Muscle weakness (generalized) (M62.81)   Activity Tolerance Patient tolerated treatment well   Patient Left in chair;with call bell/phone within reach;with chair alarm set   Nurse Communication Mobility status        Time: 8592-9244 OT Time Calculation (min): 26 min  Charges: OT General Charges $OT Visit: 1  Visit OT Treatments $Self Care/Home Management : 23-37 mins  Kari Baars, [place] [phone] Office- [place], Edwena Felty D 11/16/2018, 2:15 PM

## 2018-11-16 NOTE — Progress Notes (Signed)
Follow up call placed to Amy at [facility] [place] [facility] Bed has not arrived. Will place another call at 7p.

## 2018-11-17 MED ORDER — FUROSEMIDE 20 MG PO TABS: 20.0000 mg | ORAL_TABLET | Freq: Every day | ORAL | 1 refills | Status: DC

## 2018-11-17 NOTE — Progress Notes (Signed)
Patient was supposed to be discharged yesterday however due to equipment not being delivered to his home patient was kept in-house for another day. Patient currently stable.  No significant change from 11/16/2018.  Will discharge home in stable and improved condition with close outpatient follow-up with PCP.  No charge.

## 2018-11-17 NOTE — Plan of Care (Signed)
  Problem: Clinical Measurements: Goal: Ability to maintain clinical measurements within normal limits will improve Outcome: Adequate for Discharge Goal: Will remain free from infection Outcome: Adequate for Discharge Goal: Diagnostic test results will improve Outcome: Adequate for Discharge Goal: Respiratory complications will improve Outcome: Adequate for Discharge Goal: Cardiovascular complication will be avoided Outcome: Adequate for Discharge   Problem: Activity: Goal: Risk for activity intolerance will decrease Outcome: Adequate for Discharge   Problem: Nutrition: Goal: Adequate nutrition will be maintained Outcome: Adequate for Discharge   Problem: Coping: Goal: Level of anxiety will decrease Outcome: Adequate for Discharge   Problem: Elimination: Goal: Will not experience complications related to bowel motility Outcome: Adequate for Discharge Goal: Will not experience complications related to urinary retention Outcome: Adequate for Discharge   Problem: Pain Managment: Goal: General experience of comfort will improve Outcome: Adequate for Discharge   Problem: Safety: Goal: Ability to remain free from injury will improve Outcome: Adequate for Discharge   Problem: Skin Integrity: Goal: Risk for impaired skin integrity will decrease Outcome: Adequate for Discharge   

## 2018-11-19 DIAGNOSIS — I2510 Atherosclerotic heart disease of native coronary artery without angina pectoris: Secondary | ICD-10-CM | POA: Diagnosis not present

## 2018-11-19 DIAGNOSIS — Z9181 History of falling: Secondary | ICD-10-CM | POA: Diagnosis not present

## 2018-11-19 DIAGNOSIS — Z6830 Body mass index (BMI) 30.0-30.9, adult: Secondary | ICD-10-CM | POA: Diagnosis not present

## 2018-11-19 DIAGNOSIS — N183 Chronic kidney disease, stage 3 (moderate): Secondary | ICD-10-CM | POA: Diagnosis not present

## 2018-11-19 DIAGNOSIS — E1122 Type 2 diabetes mellitus with diabetic chronic kidney disease: Secondary | ICD-10-CM | POA: Diagnosis not present

## 2018-11-19 DIAGNOSIS — R634 Abnormal weight loss: Secondary | ICD-10-CM | POA: Diagnosis not present

## 2018-11-19 DIAGNOSIS — I130 Hypertensive heart and chronic kidney disease with heart failure and stage 1 through stage 4 chronic kidney disease, or unspecified chronic kidney disease: Secondary | ICD-10-CM | POA: Diagnosis not present

## 2018-11-19 DIAGNOSIS — M459 Ankylosing spondylitis of unspecified sites in spine: Secondary | ICD-10-CM | POA: Diagnosis not present

## 2018-11-19 DIAGNOSIS — J9601 Acute respiratory failure with hypoxia: Secondary | ICD-10-CM | POA: Diagnosis not present

## 2018-11-19 DIAGNOSIS — Z951 Presence of aortocoronary bypass graft: Secondary | ICD-10-CM | POA: Diagnosis not present

## 2018-11-19 DIAGNOSIS — I5033 Acute on chronic diastolic (congestive) heart failure: Secondary | ICD-10-CM | POA: Diagnosis not present

## 2018-11-19 DIAGNOSIS — Z96659 Presence of unspecified artificial knee joint: Secondary | ICD-10-CM | POA: Diagnosis not present

## 2018-11-20 ENCOUNTER — Other Ambulatory Visit: Payer: Self-pay | Admitting: *Deleted

## 2018-11-20 DIAGNOSIS — J9601 Acute respiratory failure with hypoxia: Secondary | ICD-10-CM | POA: Diagnosis not present

## 2018-11-20 DIAGNOSIS — E1122 Type 2 diabetes mellitus with diabetic chronic kidney disease: Secondary | ICD-10-CM | POA: Diagnosis not present

## 2018-11-20 DIAGNOSIS — I5033 Acute on chronic diastolic (congestive) heart failure: Secondary | ICD-10-CM | POA: Diagnosis not present

## 2018-11-20 DIAGNOSIS — I130 Hypertensive heart and chronic kidney disease with heart failure and stage 1 through stage 4 chronic kidney disease, or unspecified chronic kidney disease: Secondary | ICD-10-CM | POA: Diagnosis not present

## 2018-11-20 DIAGNOSIS — R634 Abnormal weight loss: Secondary | ICD-10-CM | POA: Diagnosis not present

## 2018-11-20 DIAGNOSIS — N183 Chronic kidney disease, stage 3 (moderate): Secondary | ICD-10-CM | POA: Diagnosis not present

## 2018-11-20 NOTE — Patient Outreach (Signed)
[place] [facility]) Care Management  11/20/2018  Lawrence Warner 1918/03/17 [id number]   RED ON EMMI ALERT Day # 1 Date: 11/20/2018 Red Alert Reason: "Not read discharge papers, not scheduled follow up appointment, doesn't now who to call with changes in condition."   Outreach attempt # 1.  Received notification that member triggered red on EMMI general discharge dashboard.  Per chart, he was discharged to [facility] on 9/22 after being hospitalized with complications of heart failure.      Call placed to member, he report he does not feel like talking at this time.  Gives permission to speak with son and/or daughter in law (DPR in chart).  Call then placed to [facility], but he is unavailable.  Daughter in law Lawrence Warner verifies member's identity.  She confirms that member was discharged to [facility] with home health for PT, OT, and nursing.  She also confirms he has support from the staff at the facility as well as [facility] for in home aide/nursing support.  She report member can get overwhelmed and agitated when having too many people involved in his care.  State he does not like for she and her husband to visit often, but also confirm that they stay active.  Report Lawrence Warner was at the facility yesterday reviewing discharge instructions and medications.  Although member may not know discharge instructions and or who to contact in case of emergency, they have protocols in place at the facility for these situations and his overall care (appointments, etc) is managed by son and daughter in law.  She denies any further assistance at this time.  Provided her with this care manager's contact information in case member's needs change.  Plan: RN CM will close case at this time.  Will have name removed from [facility] program as he does not manage his care and may be unable to provide accurate answers.

## 2018-11-22 ENCOUNTER — Other Ambulatory Visit: Payer: Self-pay

## 2018-11-22 ENCOUNTER — Emergency Department (HOSPITAL_COMMUNITY): Payer: Medicare Other

## 2018-11-22 ENCOUNTER — Inpatient Hospital Stay (HOSPITAL_COMMUNITY)
Admission: EM | Admit: 2018-11-22 | Discharge: 2018-11-24 | DRG: 291 | Disposition: A | Discharge status: Home Health | Payer: Medicare Other | Attending: Family Medicine | Admitting: Family Medicine

## 2018-11-22 ENCOUNTER — Encounter (HOSPITAL_COMMUNITY): Payer: Self-pay | Admitting: Oncology

## 2018-11-22 DIAGNOSIS — I5084 End stage heart failure: Secondary | ICD-10-CM | POA: Diagnosis not present

## 2018-11-22 DIAGNOSIS — I509 Heart failure, unspecified: Secondary | ICD-10-CM

## 2018-11-22 DIAGNOSIS — I10 Essential (primary) hypertension: Secondary | ICD-10-CM | POA: Diagnosis not present

## 2018-11-22 DIAGNOSIS — Z66 Do not resuscitate: Secondary | ICD-10-CM | POA: Diagnosis not present

## 2018-11-22 DIAGNOSIS — R9431 Abnormal electrocardiogram [ECG] [EKG]: Secondary | ICD-10-CM

## 2018-11-22 DIAGNOSIS — Z96641 Presence of right artificial hip joint: Secondary | ICD-10-CM | POA: Diagnosis present

## 2018-11-22 DIAGNOSIS — Z880 Allergy status to penicillin: Secondary | ICD-10-CM

## 2018-11-22 DIAGNOSIS — I5043 Acute on chronic combined systolic (congestive) and diastolic (congestive) heart failure: Secondary | ICD-10-CM | POA: Diagnosis present

## 2018-11-22 DIAGNOSIS — Z6830 Body mass index (BMI) 30.0-30.9, adult: Secondary | ICD-10-CM

## 2018-11-22 DIAGNOSIS — E440 Moderate protein-calorie malnutrition: Secondary | ICD-10-CM | POA: Diagnosis present

## 2018-11-22 DIAGNOSIS — N189 Chronic kidney disease, unspecified: Secondary | ICD-10-CM

## 2018-11-22 DIAGNOSIS — I2510 Atherosclerotic heart disease of native coronary artery without angina pectoris: Secondary | ICD-10-CM | POA: Diagnosis present

## 2018-11-22 DIAGNOSIS — I2583 Coronary atherosclerosis due to lipid rich plaque: Secondary | ICD-10-CM

## 2018-11-22 DIAGNOSIS — N184 Chronic kidney disease, stage 4 (severe): Secondary | ICD-10-CM | POA: Diagnosis present

## 2018-11-22 DIAGNOSIS — Z888 Allergy status to other drugs, medicaments and biological substances status: Secondary | ICD-10-CM

## 2018-11-22 DIAGNOSIS — E1122 Type 2 diabetes mellitus with diabetic chronic kidney disease: Secondary | ICD-10-CM | POA: Diagnosis not present

## 2018-11-22 DIAGNOSIS — Z79899 Other long term (current) drug therapy: Secondary | ICD-10-CM

## 2018-11-22 DIAGNOSIS — E875 Hyperkalemia: Secondary | ICD-10-CM | POA: Diagnosis present

## 2018-11-22 DIAGNOSIS — Z20828 Contact with and (suspected) exposure to other viral communicable diseases: Secondary | ICD-10-CM | POA: Diagnosis not present

## 2018-11-22 DIAGNOSIS — J9601 Acute respiratory failure with hypoxia: Secondary | ICD-10-CM

## 2018-11-22 DIAGNOSIS — E785 Hyperlipidemia, unspecified: Secondary | ICD-10-CM | POA: Diagnosis present

## 2018-11-22 DIAGNOSIS — Z836 Family history of other diseases of the respiratory system: Secondary | ICD-10-CM

## 2018-11-22 DIAGNOSIS — I130 Hypertensive heart and chronic kidney disease with heart failure and stage 1 through stage 4 chronic kidney disease, or unspecified chronic kidney disease: Secondary | ICD-10-CM | POA: Diagnosis not present

## 2018-11-22 DIAGNOSIS — I447 Left bundle-branch block, unspecified: Secondary | ICD-10-CM | POA: Diagnosis not present

## 2018-11-22 DIAGNOSIS — Z8701 Personal history of pneumonia (recurrent): Secondary | ICD-10-CM

## 2018-11-22 DIAGNOSIS — R0602 Shortness of breath: Secondary | ICD-10-CM | POA: Diagnosis not present

## 2018-11-22 DIAGNOSIS — I110 Hypertensive heart disease with heart failure: Secondary | ICD-10-CM | POA: Diagnosis not present

## 2018-11-22 DIAGNOSIS — I5033 Acute on chronic diastolic (congestive) heart failure: Secondary | ICD-10-CM | POA: Diagnosis not present

## 2018-11-22 DIAGNOSIS — R0689 Other abnormalities of breathing: Secondary | ICD-10-CM | POA: Diagnosis not present

## 2018-11-22 DIAGNOSIS — E119 Type 2 diabetes mellitus without complications: Secondary | ICD-10-CM

## 2018-11-22 DIAGNOSIS — Z951 Presence of aortocoronary bypass graft: Secondary | ICD-10-CM

## 2018-11-22 DIAGNOSIS — Z87891 Personal history of nicotine dependence: Secondary | ICD-10-CM

## 2018-11-22 DIAGNOSIS — N183 Chronic kidney disease, stage 3 (moderate): Secondary | ICD-10-CM

## 2018-11-22 DIAGNOSIS — I959 Hypotension, unspecified: Secondary | ICD-10-CM | POA: Diagnosis not present

## 2018-11-22 LAB — CBC WITH DIFFERENTIAL/PLATELET
Abs Immature Granulocytes: 0.05 10*3/uL (ref 0.00–0.07)
Basophils Absolute: 0 10*3/uL (ref 0.0–0.1)
Basophils Relative: 0 %
Eosinophils Absolute: 0.1 10*3/uL (ref 0.0–0.5)
Eosinophils Relative: 1 %
HCT: 40.8 % (ref 39.0–52.0)
Hemoglobin: 13.1 g/dL (ref 13.0–17.0)
Immature Granulocytes: 1 %
Lymphocytes Relative: 18 %
Lymphs Abs: 1.1 10*3/uL (ref 0.7–4.0)
MCH: 31.7 pg (ref 26.0–34.0)
MCHC: 32.1 g/dL (ref 30.0–36.0)
MCV: 98.8 fL (ref 80.0–100.0)
Monocytes Absolute: 0.7 10*3/uL (ref 0.1–1.0)
Monocytes Relative: 10 %
Neutro Abs: 4.5 10*3/uL (ref 1.7–7.7)
Neutrophils Relative %: 70 %
Platelets: 127 10*3/uL — ABNORMAL LOW (ref 150–400)
RBC: 4.13 MIL/uL — ABNORMAL LOW (ref 4.22–5.81)
RDW: 12 % (ref 11.5–15.5)
WBC: 6.4 10*3/uL (ref 4.0–10.5)
nRBC: 0 % (ref 0.0–0.2)

## 2018-11-22 LAB — POCT I-STAT 7, (LYTES, BLD GAS, ICA,H+H)
Acid-base deficit: 4 mmol/L — ABNORMAL HIGH (ref 0.0–2.0)
Bicarbonate: 20.9 mmol/L (ref 20.0–28.0)
Calcium, Ion: 1.3 mmol/L (ref 1.15–1.40)
HCT: 37 % — ABNORMAL LOW (ref 39.0–52.0)
Hemoglobin: 12.6 g/dL — ABNORMAL LOW (ref 13.0–17.0)
O2 Saturation: 97 %
Patient temperature: 97.9
Potassium: 4.8 mmol/L (ref 3.5–5.1)
Sodium: 144 mmol/L (ref 135–145)
TCO2: 22 mmol/L (ref 22–32)
pCO2 arterial: 36.5 mmHg (ref 32.0–48.0)
pH, Arterial: 7.364 (ref 7.350–7.450)
pO2, Arterial: 91 mmHg (ref 83.0–108.0)

## 2018-11-22 LAB — COMPREHENSIVE METABOLIC PANEL
ALT: 16 U/L (ref 0–44)
AST: 16 U/L (ref 15–41)
Albumin: 2.8 g/dL — ABNORMAL LOW (ref 3.5–5.0)
Alkaline Phosphatase: 54 U/L (ref 38–126)
Anion gap: 11 (ref 5–15)
BUN: 66 mg/dL — ABNORMAL HIGH (ref 8–23)
CO2: 20 mmol/L — ABNORMAL LOW (ref 22–32)
Calcium: 9.1 mg/dL (ref 8.9–10.3)
Chloride: 112 mmol/L — ABNORMAL HIGH (ref 98–111)
Creatinine, Ser: 2.06 mg/dL — ABNORMAL HIGH (ref 0.61–1.24)
GFR calc Af Amer: 30 mL/min — ABNORMAL LOW (ref >60)
GFR calc non Af Amer: 26 mL/min — ABNORMAL LOW (ref >60)
Glucose, Bld: 142 mg/dL — ABNORMAL HIGH (ref 70–99)
Potassium: 5.2 mmol/L — ABNORMAL HIGH (ref 3.5–5.1)
Sodium: 143 mmol/L (ref 135–145)
Total Bilirubin: 0.4 mg/dL (ref 0.3–1.2)
Total Protein: 6.4 g/dL — ABNORMAL LOW (ref 6.5–8.1)

## 2018-11-22 LAB — MAGNESIUM: Magnesium: 2.4 mg/dL (ref 1.7–2.4)

## 2018-11-22 LAB — BRAIN NATRIURETIC PEPTIDE: B Natriuretic Peptide: 300.1 pg/mL — ABNORMAL HIGH (ref 0.0–100.0)

## 2018-11-22 LAB — SARS CORONAVIRUS 2 BY RT PCR (HOSPITAL ORDER, PERFORMED IN ~~LOC~~ HOSPITAL LAB): SARS Coronavirus 2: NEGATIVE

## 2018-11-22 LAB — GLUCOSE, CAPILLARY
Glucose-Capillary: 198 mg/dL — ABNORMAL HIGH (ref 70–99)
Glucose-Capillary: 176 mg/dL — ABNORMAL HIGH (ref 70–99)

## 2018-11-22 LAB — TROPONIN I (HIGH SENSITIVITY)
Troponin I (High Sensitivity): 43 ng/L — ABNORMAL HIGH (ref <18)
Troponin I (High Sensitivity): 45 ng/L — ABNORMAL HIGH (ref <18)

## 2018-11-22 MED ORDER — ACETAMINOPHEN 500 MG PO TABS: 500.0000 mg | ORAL_TABLET | Freq: Four times a day (QID) | ORAL | Status: DC | PRN

## 2018-11-22 MED ORDER — FUROSEMIDE 10 MG/ML IJ SOLN
20.0000 mg | Freq: Once | INTRAMUSCULAR | Status: AC
  Administered 2018-11-22: 20 mg via INTRAVENOUS
  Filled 2018-11-22: qty 2

## 2018-11-22 MED ORDER — SIMVASTATIN 20 MG PO TABS
20.0000 mg | ORAL_TABLET | Freq: Every day | ORAL | Status: DC
  Administered 2018-11-22 – 2018-11-23 (×2): 20 mg via ORAL
  Filled 2018-11-22 (×2): qty 1

## 2018-11-22 MED ORDER — CYCLOSPORINE 0.05 % OP EMUL
1.0000 [drp] | Freq: Two times a day (BID) | OPHTHALMIC | Status: DC
  Administered 2018-11-22 – 2018-11-24 (×5): 1 [drp] via OPHTHALMIC
  Filled 2018-11-22 (×6): qty 30

## 2018-11-22 MED ORDER — ENOXAPARIN SODIUM 30 MG/0.3ML ~~LOC~~ SOLN
30.0000 mg | SUBCUTANEOUS | Status: DC
  Administered 2018-11-23 – 2018-11-24 (×2): 30 mg via SUBCUTANEOUS
  Filled 2018-11-22 (×2): qty 0.3

## 2018-11-22 MED ORDER — INSULIN ASPART 100 UNIT/ML ~~LOC~~ SOLN
0.0000 [IU] | Freq: Three times a day (TID) | SUBCUTANEOUS | Status: DC
  Administered 2018-11-22: 1 [IU] via SUBCUTANEOUS
  Administered 2018-11-22: 18:00:00 2 [IU] via SUBCUTANEOUS
  Administered 2018-11-23: 13:00:00 3 [IU] via SUBCUTANEOUS
  Administered 2018-11-23: 17:00:00 2 [IU] via SUBCUTANEOUS
  Administered 2018-11-23 – 2018-11-24 (×2): 1 [IU] via SUBCUTANEOUS

## 2018-11-22 MED ORDER — BRIMONIDINE TARTRATE 0.2 % OP SOLN
1.0000 [drp] | Freq: Three times a day (TID) | OPHTHALMIC | Status: DC
  Administered 2018-11-22 – 2018-11-24 (×6): 1 [drp] via OPHTHALMIC
  Filled 2018-11-22: qty 5

## 2018-11-22 MED ORDER — HEPARIN SODIUM (PORCINE) 5000 UNIT/ML IJ SOLN
5000.0000 [IU] | Freq: Three times a day (TID) | INTRAMUSCULAR | Status: DC
  Filled 2018-11-22: qty 1

## 2018-11-22 MED ORDER — FUROSEMIDE 10 MG/ML IJ SOLN: 40.0000 mg | Freq: Every day | INTRAMUSCULAR | Status: DC

## 2018-11-22 NOTE — ED Notes (Signed)
Pt sitting in recliner @ bedside eating lunch.

## 2018-11-22 NOTE — Progress Notes (Signed)
   11/22/18 1615  Vitals  Pulse Rate (!) 105  ECG Heart Rate (!) 106  Oxygen Therapy  SpO2 94 %  O2 Device Nasal Cannula  O2 Flow Rate (L/min) 2 L/min  Patient Activity (if Appropriate) In bed  Pulse Oximetry Type Continuous  MEWS Score  MEWS RR 0  MEWS Pulse 1  MEWS Systolic 0  MEWS LOC 0  MEWS Temp 0  MEWS Score 1  MEWS Score Color Green   The patient is in bed resting, after rise in heart rate with ambulation the rate has continued to be elevated. EKG taken and MD [place] notified. I will continue to monitor the patient closely.   Saddie Benders RN BSN

## 2018-11-22 NOTE — ED Provider Notes (Signed)
[facility] EMERGENCY DEPARTMENT Provider Note   CSN: [id number] Arrival date & time: 11/22/18  0116     History   Chief Complaint Chief Complaint  Patient presents with  . Shortness of Breath    HPI Lawrence Warner is a 85 y.o. male.     Level 5 caveat for respiratory distress.  Patient from assisted living facility with difficulty breathing and shortness of breath and onset this afternoon.  Patient stated having trouble breathing all afternoon.  EMS reports labored breathing with abnormal EKG.  No fever or cough.  Patient states he has intermittent chest pain with deep breathing.  He was admitted to the hospital on September 17 with a diastolic heart failure exacerbation and sent home on Lasix which he states compliance with.  Son at bedside states he did briefly get better but his breathing quickly worsened again and he is worse now than when he went home.  He was not discharged on oxygen.  There is been no leg swelling.  No history of DVT or PE.  No history of hormone therapy.  Smoker remotely with history of CABG in the past.  Patient and son confirmed DNR and DNI.  The history is provided by the EMS personnel, a relative and the patient. The history is limited by the condition of the patient.  Shortness of Breath Associated symptoms: chest pain and cough   Associated symptoms: no abdominal pain, no fever, no headaches and no vomiting     Past Medical History:  Diagnosis Date  . BPH (benign prostatic hypertrophy)   . Chronic heart failure ([place])   . Chronic renal insufficiency   . Coronary artery disease    CABG in 2000  . Diabetes type 2, controlled ([place])    Diet-controlled  . Diabetic neuropathy ([place])   . Dyslipidemia   . Hypertension   . ITP (idiopathic thrombocytopenic purpura)   . Osteoarthritis   . Pneumonia     Patient Active Problem List   Diagnosis Date Noted  . CHF (congestive heart failure) ([place]) 11/12/2018  . Coronary artery disease  involving native coronary artery of native heart without angina pectoris 01/08/2018  . Generalized weakness 05/17/2017  . Hypertensive emergency 05/17/2017  . RSV bronchitis   . Bronchospasm with bronchitis, acute 03/14/2017  . Acute on chronic diastolic CHF (congestive heart failure) ([place]) 11/26/2016  . CRI (chronic renal insufficiency), stage 3 (moderate) ([place]) 11/26/2016  . LBBB (left bundle branch block) 11/26/2016  . Hypoxia   . Acute respiratory failure with hypoxia ([place]) 07/20/2016  . Chronic respiratory failure with hypoxia ([place]) 02/28/2016  . Dyslipidemia 02/28/2016  . Constipation 12/07/2015  . Bradycardia 11/28/2015  . Port catheter in place 10/24/2015  . GERD (gastroesophageal reflux disease) 10/09/2015  . Gait instability 10/09/2015  . Chest pain 10/05/2015  . Diet-controlled diabetes mellitus ([place]) 05/07/2015  . Hx of CABG 05/07/2015  . Essential hypertension 05/07/2015  . Muscle cramps 04/25/2015  . Shortness of breath 04/11/2015  . Idiopathic thrombocytopenic purpura ([place]) 09/27/2014    Past Surgical History:  Procedure Laterality Date  . CHOLECYSTECTOMY    . CORONARY ARTERY BYPASS GRAFT  2000  . Right hip replacement  2014  . TONSILLECTOMY          Home Medications    Prior to Admission medications   Medication Sig Start Date End Date Taking? Authorizing Provider  acetaminophen (TYLENOL) 500 MG tablet Take 1,500 mg by mouth daily.     [provider]  brimonidine (ALPHAGAN) 0.2 % ophthalmic solution Place 1 drop into both eyes 3 (three) times daily.    [provider]  cycloSPORINE (RESTASIS) 0.05 % ophthalmic emulsion Place 1 drop into both eyes every 12 (twelve) hours.     [provider]  Dextran 70-Hypromellose (ARTIFICIAL TEARS PF OP) Place 1 drop into both eyes 4 (four) times daily.     [provider]  furosemide (LASIX) 20 MG tablet Take 1 tablet (20 mg total) by mouth daily. 11/17/18   Eugenie Filler, MD   hydroxypropyl methylcellulose / hypromellose (ISOPTO TEARS / GONIOVISC) 2.5 % ophthalmic solution Place 1 drop into both eyes 3 (three) times daily as needed for dry eyes.    [provider]  losartan (COZAAR) 100 MG tablet Take 100 mg by mouth daily.    [provider]  magnesium oxide (MAG-OX) 400 MG tablet Take 400 mg daily by mouth.    [provider]  Multiple Vitamin (MULTIVITAMIN WITH MINERALS) TABS tablet Take 1 tablet by mouth daily.    [provider]  simvastatin (ZOCOR) 20 MG tablet Take 20 mg by mouth daily.    [provider]    Family History Family History  Problem Relation Age of Onset  . Lung disease Father   . Cancer Brother     Social History Social History   Tobacco Use  . Smoking status: Former Smoker    Packs/day: 2.00    Years: 25.00    Pack years: 50.00  . Smokeless tobacco: Never Used  Substance Use Topics  . Alcohol use: No  . Drug use: No     Allergies   Alfuzosin hcl er, Imdur [isosorbide dinitrate], Penicillins, and Tape   Review of Systems Review of Systems  Constitutional: Negative for fever.  HENT: Negative for congestion and rhinorrhea.   Eyes: Negative for visual disturbance.  Respiratory: Positive for cough, chest tightness and shortness of breath.   Cardiovascular: Positive for chest pain.  Gastrointestinal: Negative for abdominal pain, anal bleeding, nausea and vomiting.  Genitourinary: Negative for dysuria and hematuria.  Musculoskeletal: Negative for arthralgias and myalgias.  Neurological: Negative for dizziness, weakness and headaches.   all other systems are negative except as noted in the HPI and PMH.     Physical Exam Updated Vital Signs BP 113/66 (BP Location: Right Arm)   Pulse (!) 102   Temp 97.9 F (36.6 C) (Oral)   Resp (!) 27   Ht 5\' 7"  (1.702 m)   Wt 88.5 kg   SpO2 96%   BMI 30.54 kg/m   Physical Exam Vitals signs and nursing note reviewed.  Constitutional:       General: He is in acute distress.     Appearance: He is well-developed. He is ill-appearing.     Comments: Moderate respiratory distress, speaking in short phrases  HENT:     Head: Normocephalic and atraumatic.     Mouth/Throat:     Pharynx: No oropharyngeal exudate.  Eyes:     Conjunctiva/sclera: Conjunctivae normal.     Pupils: Pupils are equal, round, and reactive to light.  Neck:     Musculoskeletal: Normal range of motion and neck supple.     Comments: No meningismus. Cardiovascular:     Rate and Rhythm: Normal rate and regular rhythm.     Heart sounds: Normal heart sounds. No murmur.  Pulmonary:     Effort: Respiratory distress present.     Breath sounds: Rales present. No rhonchi.  Comments: Tachypnea to the 30s, diminished breath sounds with basilar crackles Abdominal:     Palpations: Abdomen is soft.     Tenderness: There is no abdominal tenderness. There is no guarding or rebound.  Musculoskeletal: Normal range of motion.        General: No tenderness.  Skin:    General: Skin is warm.     Capillary Refill: Capillary refill takes less than 2 seconds.  Neurological:     General: No focal deficit present.     Mental Status: He is alert and oriented to person, place, and time. Mental status is at baseline.     Cranial Nerves: No cranial nerve deficit.     Motor: No abnormal muscle tone.     Coordination: Coordination normal.     Comments:  5/5 strength throughout. CN 2-12 intact.Equal grip strength.   Psychiatric:        Behavior: Behavior normal.      ED Treatments / Results  Labs (all labs ordered are listed, but only abnormal results are displayed) Labs Reviewed  CBC WITH DIFFERENTIAL/PLATELET - Abnormal; Notable for the following components:      Result Value   RBC 4.13 (*)    Platelets 127 (*)    All other components within normal limits  COMPREHENSIVE METABOLIC PANEL - Abnormal; Notable for the following components:   Potassium 5.2 (*)     Chloride 112 (*)    CO2 20 (*)    Glucose, Bld 142 (*)    BUN 66 (*)    Creatinine, Ser 2.06 (*)    Total Protein 6.4 (*)    Albumin 2.8 (*)    GFR calc non Af Amer 26 (*)    GFR calc Af Amer 30 (*)    All other components within normal limits  BRAIN NATRIURETIC PEPTIDE - Abnormal; Notable for the following components:   B Natriuretic Peptide 300.1 (*)    All other components within normal limits  POCT I-STAT 7, (LYTES, BLD GAS, ICA,H+H) - Abnormal; Notable for the following components:   Acid-base deficit 4.0 (*)    HCT 37.0 (*)    Hemoglobin 12.6 (*)    All other components within normal limits  TROPONIN I (HIGH SENSITIVITY) - Abnormal; Notable for the following components:   Troponin I (High Sensitivity) 43 (*)    All other components within normal limits  TROPONIN I (HIGH SENSITIVITY) - Abnormal; Notable for the following components:   Troponin I (High Sensitivity) 45 (*)    All other components within normal limits  SARS CORONAVIRUS 2 (HOSPITAL ORDER, [place] [facility])  MAGNESIUM  CBG MONITORING, ED    EKG EKG Interpretation  Date/Time:  Sunday November 22 2018 01:18:48 EDT Ventricular Rate:  104 PR Interval:    QRS Duration: 165 QT Interval:  398 QTC Calculation: 524 R Axis:   -54 Text Interpretation:  Sinus tachycardia Multiple ventricular premature complexes Left bundle branch block Rate faster Left bundle branch block Confirmed by Ezequiel Essex [id number]) on 11/22/2018 1:37:20 AM   Radiology Dg Chest Portable 1 View  Result Date: 11/22/2018 CLINICAL DATA:  Shortness of breath. Recent hospital admission for same. EXAM: PORTABLE CHEST 1 VIEW COMPARISON:  Radiograph 11/11/2018. V/Q scan 11/12/2018 FINDINGS: Post median sternotomy. Similar cardiomegaly. Small left pleural effusion not significantly changed from prior. Minor bibasilar atelectasis. Minimal fluid in the right minor fissure. No pulmonary edema or acute airspace disease. No  pneumothorax. No acute osseous abnormalities. IMPRESSION: 1. Stable  cardiomegaly and small left pleural effusion. 2. Minor bibasilar atelectasis. Electronically Signed   By: Keith Rake M.D.   On: 11/22/2018 02:40    Procedures Procedures (including critical care time)  Medications Ordered in ED Medications  furosemide (LASIX) injection 20 mg (has no administration in time range)     Initial Impression / Assessment and Plan / ED Course  I have reviewed the triage vital signs and the nursing notes.  Pertinent labs & imaging results that were available during my care of the patient were reviewed by me and considered in my medical decision making (see chart for details).       Patient with respiratory distress.  Recent admission for diastolic heart failure.  His EKG is a left bundle branch block with ST elevation in V2 and V3.  This is discussed with cardiology Dr. Rhae Hammock who agrees that it does not meet [place] criteria. Patient and son confirm DNR/DNI.  Patient continues to deny chest pain.  His EKG shows left bundle branch block similar to previous. D/w cardiology as above. Chest x-ray shows cardiomegaly and small pleural effusions bilaterally.  He is given a dose of IV Lasix.  Chart review shows low probability VQ scan on September 17.  Work of breathing has improved after lasix and patient did have some diuresis.  ABG without significant CO2 retention.  bipap was attempted but patient not able to tolerate. Troponin minimally elevated similar to previous. Cr near baseline.  Will need admission for continued dyspnea likely 2/2 diastolic CHF exacerbation  D/w Dr. Marlowe Sax.   Lawrence Warner was evaluated in Emergency Department on 11/22/2018 for the symptoms described in the history of present illness. He was evaluated in the context of the global COVID-19 pandemic, which necessitated consideration that the patient might be at risk for infection with the SARS-CoV-2 virus  that causes COVID-19. Institutional protocols and algorithms that pertain to the evaluation of patients at risk for COVID-19 are in a state of rapid change based on information released by regulatory bodies including the CDC and federal and state organizations. These policies and algorithms were followed during the patient's care in the ED.  CRITICAL CARE Performed by: Ezequiel Essex Total critical care time: 40 minutes Critical care time was exclusive of separately billable procedures and treating other patients. Critical care was necessary to treat or prevent imminent or life-threatening deterioration. Critical care was time spent personally by me on the following activities: development of treatment plan with patient and/or surrogate as well as nursing, discussions with consultants, evaluation of patient's response to treatment, examination of patient, obtaining history from patient or surrogate, ordering and performing treatments and interventions, ordering and review of laboratory studies, ordering and review of radiographic studies, pulse oximetry and re-evaluation of patient's condition.   Final Clinical Impressions(s) / ED Diagnoses   Final diagnoses:  Acute respiratory failure with hypoxia ([place])  Acute on chronic diastolic congestive heart failure [facility])    ED Discharge Orders    None       Ezequiel Essex, MD 11/22/18 [phone]

## 2018-11-22 NOTE — ED Triage Notes (Signed)
Pt bib [facility] from [facility] d/t shob.  Pt states he was admitted last week for the same. Pt has labored breathing.  EMS reported abnormal EKG. Pt is A&O x 4.

## 2018-11-22 NOTE — ED Notes (Signed)
CBG 136 

## 2018-11-22 NOTE — ED Triage Notes (Signed)
Lunch ordered 

## 2018-11-22 NOTE — H&P (Signed)
History and Physical    Lawrence Warner KVQ:[id number] DOB: February 15, 1919 DOA: 11/22/2018  PCP: Lawrence Cuff, MD Patient coming from: Home  Chief Complaint: Shortness of breath  HPI: Lawrence Warner is a 85 y.o. male with medical history significant of chronic diastolic congestive heart failure, CAD status post CABG, type 2 diabetes, hypertension, hyperlipidemia, BPH, chronic renal insufficiency presenting to the hospital for evaluation of shortness of breath.  Patient reports 1 week history of dyspnea and orthopnea.  Denies chest pain.  States he is supposed to be taking Lasix but has not been taking it regularly.  Denies fevers or chills.  Denies history of blood clots.  No additional history could be obtained from him.  ED Course: Tachypneic with respiratory rate up to 30s.  Not hypoxic.  SARS-CoV-2 test negative.  BNP 300.  Chest x-ray showing stable cardiomegaly and small left pleural effusion. High-sensitivity troponin 43. EKG with left bundle branch block similar to prior tracing but showing new ST elevation in V2 and V3.  ED provider discussed this with cardiology Dr. Rhae Hammock who stated EKG does not meet Scarbosa criteria. Attempt was made to start the patient on BiPAP but he could not tolerate it. Received IV Lasix 40 mg.  Review of Systems:  All systems reviewed and apart from history of presenting illness, are negative.  Past Medical History:  Diagnosis Date  . BPH (benign prostatic hypertrophy)   . Chronic heart failure ([place])   . Chronic renal insufficiency   . Coronary artery disease    CABG in 2000  . Diabetes type 2, controlled ([place])    Diet-controlled  . Diabetic neuropathy ([place])   . Dyslipidemia   . Hypertension   . ITP (idiopathic thrombocytopenic purpura)   . Osteoarthritis   . Pneumonia     Past Surgical History:  Procedure Laterality Date  . CHOLECYSTECTOMY    . CORONARY ARTERY BYPASS GRAFT  2000  . Right hip replacement  2014  . TONSILLECTOMY       reports that he has quit smoking. He has a 50.00 pack-year smoking history. He has never used smokeless tobacco. He reports that he does not drink alcohol or use drugs.  Allergies  Allergen Reactions  . Alfuzosin Hcl Er Other (See Comments)    Confusion/Uroxatral   . Imdur [Isosorbide Dinitrate] Other (See Comments)    REACTION NOT RECALLED  . Penicillins Hives and Other (See Comments)    Has patient had a PCN reaction causing immediate rash, facial/tongue/throat swelling, SOB or lightheadedness with hypotension: Yes Has patient had a PCN reaction causing severe rash involving mucus membranes or skin necrosis: Unknown Has patient had a PCN reaction that required hospitalization: Unknown Has patient had a PCN reaction occurring within the last 10 years: Unknown If all of the above answers are "NO", then may proceed with Cephalosporin use.   . Tape Other (See Comments)    SKIN IS VERY THIN AND WILL TEAR EASILY    Family History  Problem Relation Age of Onset  . Lung disease Father   . Cancer Brother     Prior to Admission medications   Medication Sig Start Date End Date Taking? Authorizing Provider  acetaminophen (TYLENOL) 500 MG tablet Take 1,500 mg by mouth daily.     [provider]  brimonidine (ALPHAGAN) 0.2 % ophthalmic solution Place 1 drop into both eyes 3 (three) times daily.    [provider]  cycloSPORINE (RESTASIS) 0.05 % ophthalmic emulsion Place 1 drop into both eyes  every 12 (twelve) hours.     [provider]  Dextran 70-Hypromellose (ARTIFICIAL TEARS PF OP) Place 1 drop into both eyes 4 (four) times daily.     [provider]  furosemide (LASIX) 20 MG tablet Take 1 tablet (20 mg total) by mouth daily. 11/17/18   Eugenie Filler, MD  hydroxypropyl methylcellulose / hypromellose (ISOPTO TEARS / GONIOVISC) 2.5 % ophthalmic solution Place 1 drop into both eyes 3 (three) times daily as needed for dry eyes.    [provider]   losartan (COZAAR) 100 MG tablet Take 100 mg by mouth daily.    [provider]  magnesium oxide (MAG-OX) 400 MG tablet Take 400 mg daily by mouth.    [provider]  Multiple Vitamin (MULTIVITAMIN WITH MINERALS) TABS tablet Take 1 tablet by mouth daily.    [provider]  simvastatin (ZOCOR) 20 MG tablet Take 20 mg by mouth daily.    [provider]    Physical Exam: Vitals:   11/22/18 0445 11/22/18 0530 11/22/18 0545 11/22/18 0630  BP: 102/62 117/60 102/67 92/64  Pulse: (!) 49 (!) 48 (!) 47 94  Resp: (!) 22 (!) 28 (!) 30 (!) 23  Temp:      TempSrc:      SpO2: 98% 95% 100% 96%  Weight:      Height:        Physical Exam  Constitutional: He is oriented to person, place, and time. He appears well-developed and well-nourished. No distress.  HENT:  Head: Normocephalic.  Eyes: Right eye exhibits no discharge. Left eye exhibits no discharge.  Neck: Neck supple. JVD present.  Cardiovascular: Normal rate, regular rhythm and intact distal pulses.  Pulmonary/Chest: Effort normal. No respiratory distress. He has no wheezes. He has no rales.  Abdominal: Soft. He exhibits no distension. There is no abdominal tenderness. There is no guarding.  Musculoskeletal:        General: Edema present.     Comments: Trace pedal edema  Neurological: He is alert and oriented to person, place, and time.  Skin: Skin is warm and dry. He is not diaphoretic.     Labs on Admission: I have personally reviewed following labs and imaging studies  CBC: Recent Labs  Lab 11/22/18 0241 11/22/18 0519  WBC 6.4  --   NEUTROABS 4.5  --   HGB 13.1 12.6*  HCT 40.8 37.0*  MCV 98.8  --   PLT 127*  --    Basic Metabolic Panel: Recent Labs  Lab 11/16/18 0509 11/22/18 0241 11/22/18 0519  NA 137 143 144  K 4.4 5.2* 4.8  CL 105 112*  --   CO2 21* 20*  --   GLUCOSE 120* 142*  --   BUN 76* 66*  --   CREATININE 2.04* 2.06*  --   CALCIUM 9.2 9.1  --    GFR: Estimated  Creatinine Clearance: 21.8 mL/min (A) (by C-G formula based on SCr of 2.06 mg/dL (H)). Liver Function Tests: Recent Labs  Lab 11/22/18 0241  AST 16  ALT 16  ALKPHOS 54  BILITOT 0.4  PROT 6.4*  ALBUMIN 2.8*   No results for input(s): LIPASE, AMYLASE in the last 168 hours. No results for input(s): AMMONIA in the last 168 hours. Coagulation Profile: No results for input(s): INR, PROTIME in the last 168 hours. Cardiac Enzymes: No results for input(s): CKTOTAL, CKMB, CKMBINDEX, TROPONINI in the last 168 hours. BNP (last 3 results) No results for input(s): PROBNP in  the last 8760 hours. HbA1C: No results for input(s): HGBA1C in the last 72 hours. CBG: No results for input(s): GLUCAP in the last 168 hours. Lipid Profile: No results for input(s): CHOL, HDL, LDLCALC, TRIG, CHOLHDL, LDLDIRECT in the last 72 hours. Thyroid Function Tests: No results for input(s): TSH, T4TOTAL, FREET4, T3FREE, THYROIDAB in the last 72 hours. Anemia Panel: No results for input(s): VITAMINB12, FOLATE, FERRITIN, TIBC, IRON, RETICCTPCT in the last 72 hours. Urine analysis:    Component Value Date/Time   COLORURINE YELLOW 11/11/2018 1747   APPEARANCEUR CLEAR 11/11/2018 1747   LABSPEC 1.018 11/11/2018 1747   PHURINE 6.0 11/11/2018 1747   GLUCOSEU NEGATIVE 11/11/2018 1747   HGBUR NEGATIVE 11/11/2018 1747   BILIRUBINUR NEGATIVE 11/11/2018 1747   KETONESUR NEGATIVE 11/11/2018 1747   PROTEINUR 100 (A) 11/11/2018 1747   NITRITE NEGATIVE 11/11/2018 1747   LEUKOCYTESUR NEGATIVE 11/11/2018 1747    Radiological Exams on Admission: Dg Chest Portable 1 View  Result Date: 11/22/2018 CLINICAL DATA:  Shortness of breath. Recent hospital admission for same. EXAM: PORTABLE CHEST 1 VIEW COMPARISON:  Radiograph 11/11/2018. V/Q scan 11/12/2018 FINDINGS: Post median sternotomy. Similar cardiomegaly. Small left pleural effusion not significantly changed from prior. Minor bibasilar atelectasis. Minimal fluid in the right  minor fissure. No pulmonary edema or acute airspace disease. No pneumothorax. No acute osseous abnormalities. IMPRESSION: 1. Stable cardiomegaly and small left pleural effusion. 2. Minor bibasilar atelectasis. Electronically Signed   By: Keith Rake M.D.   On: 11/22/2018 02:40    EKG: Independently reviewed.  Accelerated junctional rhythm.  LBBB similar to prior tracing.  New ST elevations in leads V2 and V3.  QTc 524.  Assessment/Plan Principal Problem:   Acute exacerbation of CHF (congestive heart failure) (HCC) Active Problems:   CKD (chronic kidney disease) stage 3, GFR 30-59 ml/min (HCC)   CAD (coronary artery disease)   Type 2 diabetes mellitus (HCC)   QT prolongation   Acute exacerbation of chronic diastolic congestive heart failure Upon arrival to the ED, patient was tachypneic with respiratory rate up to 30s and had significantly increased work of breathing.  Placed on 2 L supplemental oxygen for comfort.  SARS-CoV-2 test negative.  Although BNP not significantly elevated and chest x-ray without overt pulmonary edema, work of breathing has improved significantly after receiving IV Lasix 40 mg in the ED.  Last echo done 11/12/2018 with LVEF 50 to 55% and findings consistent with diastolic dysfunction.  PE less likely as VQ scan done during recent hospitalization on 9/17 was low probability.  High-sensitivity troponin mildly elevated but flat.  Patient denies chest pain.  Cardiology does not feel EKG changes are concerning for ACS. -Cardiac monitoring -Blood pressure currently soft.  Received IV Lasix 40 mg in the ED.  Additional Lasix can be ordered after blood pressure improves. -Monitor intake and output, daily weights, low-sodium diet with fluid restriction -Continue to monitor renal function -Continuous pulse ox, supplemental oxygen as needed  CAD status post CABG Patient denies chest pain.  High-sensitivity troponins flat 43>45.  EKG with left bundle branch block similar to  prior tracing but showing new ST elevation in V2 and V3.  ED provider discussed this with cardiology Dr. Rhae Hammock who stated EKG does not meet Scarbosa criteria. -Cardiac monitoring -Continue to monitor  CKD stage III-IV -Stable.  Creatinine 2.0, at baseline. -Continue to monitor renal function  QT prolongation -Cardiac monitoring -Keep potassium above 4 and magnesium above 2 -Avoid QT prolonging drugs if possible -Repeat EKG in a.m.  Diet controlled type 2 diabetes -Sliding scale insulin sensitive and CBG checks.  Pharmacy med rec pending.  DVT prophylaxis: Subcutaneous heparin Code Status: DNR.  Signed form at bedside. Family Communication: No family available. Disposition Plan: Anticipate discharge in 1 to 2 days. Consults called: None Admission status: It is my clinical opinion that referral for OBSERVATION is reasonable and necessary in this patient based on the above information provided. The aforementioned taken together are felt to place the patient at high risk for further clinical deterioration. However it is anticipated that the patient may be medically stable for discharge from the hospital within 24 to 48 hours.  The medical decision making on this patient was of high complexity and the patient is at high risk for clinical deterioration, therefore this is a level 3 visit.  Shela Leff MD [facility] Pager [phone]  If 7PM-7AM, please contact night-coverage [email or web address] Password [email or web address]  11/22/2018, 7:31 AM

## 2018-11-22 NOTE — Progress Notes (Signed)
PROGRESS NOTE    Lawrence Warner  WJX:[id number] DOB: 1918-09-15 DOA: 11/22/2018 PCP: Clovia Cuff, MD      Brief Narrative:  Mr. Lawrence Warner is a 85 y.o. M with CAD s/p CABG in 2000, dCHF, HTN, DM, and CKD IV baseline Cr 1.4-2.0 who presents with recurrent dyspnea.  Patient recently admitted with CHF flare, diuresed for 4 days (admit weight 90Kg, discharge weight 88 kg).  Returned home, was taking Lasix when given by son, slowly developed worsening SOB until he had SOB at rest, orthopnea.  In the ER, CXR showed small pleural effusion, no edema, BNP slightly up.  Given Lasix with improvement in symptoms.      Assessment & Plan:  Acute on chronic diastolic CHF Recent echo shows normal EF, no sig valve disease.  Here BNP elevated, effusion on CXR without significant peripheral edema. -Hold lasix until tomorrow morning, re-evaluate I/O, clinical status -Strict I/Os   CKD IV Stable relative to baseline  Coronary disease secondary prevention Hypertension BP normal -Hold losartan while diuresing -Continue simvastatin  Diabetes -Continue SSI corrections  Moderate protein calorie malnutrition APpears to be eating less than 50% meals.  Family note this is worsening over last 6 months.  Has also diffuse loss of subcutaneous fat.       DVT prophylaxis: Lovenox Code Status: DO NOT RESUSCITATE, discussed with son Family Communication: Son at bedside MDM and disposition Plan: The above medicaitons were adjusted.  The below labs and imaging reports were rviewed.  CODE STATUS was discussed with patient and proxy and changed.  For further details, please see H&P by my partner Dr. Marlowe Sax from earlier today.     The patient was admitted with CHF flare.    Objective: Vitals:   11/22/18 1100 11/22/18 1115 11/22/18 1130 11/22/18 1145  BP: 132/69 130/64 (!) 122/54 (!) 158/66  Pulse: 69 62 61 63  Resp: (!) 28 (!) 26 (!) 21 (!) 29  Temp:      TempSrc:      SpO2: 97% 97%  98% 98%  Weight:      Height:        Intake/Output Summary (Last 24 hours) at 11/22/2018 1357 Last data filed at 11/22/2018 1236 Gross per 24 hour  Intake -  Output 1300 ml  Net -1300 ml   Filed Weights   11/22/18 0127  Weight: 88.5 kg    Examination: General appearance: frail elderly adult male, alert and in mild respiraotry distress.   HEENT: Dentures in place, Anicteric, conjunctiva pink, lids and lashes normal for age. No nasal deformity, discharge, epistaxis.  Lips moist. Haering diminished. Skin: Warm and dry.  No suspicious rashes or lesions. Cardiac: RRR, no murmurs appreciated.  No LE edema.   JVP not visible in sitting position. Respiratory: Tachypneic, to 30 times per minutes.  No rales.  No wheezing.   Abdomen: Abdomen soft.  No TTP or guarding. No ascites, distension, hepatosplenomegaly.   MSK: No deformities or effusions of the large joints of the upper or lower extremities bilaterally.  Diffuse loss of subcutaneous muscle mass and fat. Neuro: Awake, oriented to place, situation. Naming is grossly intact, and the patient's recall, recent and remote, as well as general fund of knowledge seem within normal limits for age, maybe slightly slow to respond.  Muscle tone diminished globally, without fasciculations.  Moves all extremities equally, but globally extremely weak and with poor symmetric coordination.  Marland Kitchen Speech fluent.    Psych: Sensorium intact and responding to questions, attention diminished.  Affect blunted.  Judgment and insight appear slightly impaired.      Data Reviewed: I have personally reviewed following labs and imaging studies:  CBC: Recent Labs  Lab 11/22/18 0241 11/22/18 0519  WBC 6.4  --   NEUTROABS 4.5  --   HGB 13.1 12.6*  HCT 40.8 37.0*  MCV 98.8  --   PLT 127*  --    Basic Metabolic Panel: Recent Labs  Lab 11/16/18 0509 11/22/18 0241 11/22/18 0456 11/22/18 0519  NA 137 143  --  144  K 4.4 5.2*  --  4.8  CL 105 112*  --   --    CO2 21* 20*  --   --   GLUCOSE 120* 142*  --   --   BUN 76* 66*  --   --   CREATININE 2.04* 2.06*  --   --   CALCIUM 9.2 9.1  --   --   MG  --   --  2.4  --    GFR: Estimated Creatinine Clearance: 21.8 mL/min (A) (by C-G formula based on SCr of 2.06 mg/dL (H)). Liver Function Tests: Recent Labs  Lab 11/22/18 0241  AST 16  ALT 16  ALKPHOS 54  BILITOT 0.4  PROT 6.4*  ALBUMIN 2.8*   No results for input(s): LIPASE, AMYLASE in the last 168 hours. No results for input(s): AMMONIA in the last 168 hours. Coagulation Profile: No results for input(s): INR, PROTIME in the last 168 hours. Cardiac Enzymes: No results for input(s): CKTOTAL, CKMB, CKMBINDEX, TROPONINI in the last 168 hours. BNP (last 3 results) No results for input(s): PROBNP in the last 8760 hours. HbA1C: No results for input(s): HGBA1C in the last 72 hours. CBG: No results for input(s): GLUCAP in the last 168 hours. Lipid Profile: No results for input(s): CHOL, HDL, LDLCALC, TRIG, CHOLHDL, LDLDIRECT in the last 72 hours. Thyroid Function Tests: No results for input(s): TSH, T4TOTAL, FREET4, T3FREE, THYROIDAB in the last 72 hours. Anemia Panel: No results for input(s): VITAMINB12, FOLATE, FERRITIN, TIBC, IRON, RETICCTPCT in the last 72 hours. Urine analysis:    Component Value Date/Time   COLORURINE YELLOW 11/11/2018 1747   APPEARANCEUR CLEAR 11/11/2018 1747   LABSPEC 1.018 11/11/2018 1747   PHURINE 6.0 11/11/2018 1747   GLUCOSEU NEGATIVE 11/11/2018 1747   HGBUR NEGATIVE 11/11/2018 1747   BILIRUBINUR NEGATIVE 11/11/2018 1747   KETONESUR NEGATIVE 11/11/2018 1747   PROTEINUR 100 (A) 11/11/2018 1747   NITRITE NEGATIVE 11/11/2018 1747   LEUKOCYTESUR NEGATIVE 11/11/2018 1747   Sepsis Labs: @LABRCNTIP (procalcitonin:4,lacticacidven:4)  ) Recent Results (from the past 240 hour(s))  SARS Coronavirus 2  [facility] order, Performed in [facility] hospital lab) Nasopharyngeal Nasopharyngeal Swab     Status: None    Collection Time: 11/22/18  2:16 AM   Specimen: Nasopharyngeal Swab  Result Value Ref Range Status   SARS Coronavirus 2 NEGATIVE NEGATIVE Final    Comment: (NOTE) If result is NEGATIVE SARS-CoV-2 target nucleic acids are NOT DETECTED. The SARS-CoV-2 RNA is generally detectable in upper and lower  respiratory specimens during the acute phase of infection. The lowest  concentration of SARS-CoV-2 viral copies this assay can detect is 250  copies / mL. A negative result does not preclude SARS-CoV-2 infection  and should not be used as the sole basis for treatment or other  patient management decisions.  A negative result may occur with  improper specimen collection / handling, submission of specimen other  than nasopharyngeal swab, presence of viral mutation(s) within  the  areas targeted by this assay, and inadequate number of viral copies  (<250 copies / mL). A negative result must be combined with clinical  observations, patient history, and epidemiological information. If result is POSITIVE SARS-CoV-2 target nucleic acids are DETECTED. The SARS-CoV-2 RNA is generally detectable in upper and lower  respiratory specimens dur ing the acute phase of infection.  Positive  results are indicative of active infection with SARS-CoV-2.  Clinical  correlation with patient history and other diagnostic information is  necessary to determine patient infection status.  Positive results do  not rule out bacterial infection or co-infection with other viruses. If result is PRESUMPTIVE POSTIVE SARS-CoV-2 nucleic acids MAY BE PRESENT.   A presumptive positive result was obtained on the submitted specimen  and confirmed on repeat testing.  While 2019 novel coronavirus  (SARS-CoV-2) nucleic acids may be present in the submitted sample  additional confirmatory testing may be necessary for epidemiological  and / or clinical management purposes  to differentiate between  SARS-CoV-2 and other Sarbecovirus  currently known to infect humans.  If clinically indicated additional testing with an alternate test  methodology [phone]) is advised. The SARS-CoV-2 RNA is generally  detectable in upper and lower respiratory sp ecimens during the acute  phase of infection. The expected result is Negative. Fact Sheet for Patients:  StrictlyIdeas.no Fact Sheet for Healthcare Providers: BankingDealers.co.za This test is not yet approved or cleared by the [place] FDA and has been authorized for detection and/or diagnosis of SARS-CoV-2 by FDA under an Emergency Use Authorization (EUA).  This EUA will remain in effect (meaning this test can be used) for the duration of the COVID-19 declaration under Section 564(b)(1) of the Act, 21 U.S.C. section 360bbb-3(b)(1), unless the authorization is terminated or revoked sooner. Performed at [facility], [address], [place], [place] [address]          Radiology Studies: Dg Chest Portable 1 View  Result Date: 11/22/2018 CLINICAL DATA:  Shortness of breath. Recent hospital admission for same. EXAM: PORTABLE CHEST 1 VIEW COMPARISON:  Radiograph 11/11/2018. V/Q scan 11/12/2018 FINDINGS: Post median sternotomy. Similar cardiomegaly. Small left pleural effusion not significantly changed from prior. Minor bibasilar atelectasis. Minimal fluid in the right minor fissure. No pulmonary edema or acute airspace disease. No pneumothorax. No acute osseous abnormalities. IMPRESSION: 1. Stable cardiomegaly and small left pleural effusion. 2. Minor bibasilar atelectasis. Electronically Signed   By: Keith Rake M.D.   On: 11/22/2018 02:40        Scheduled Meds: . heparin injection (subcutaneous)  5,000 Units Subcutaneous Q8H  . insulin aspart  0-9 Units Subcutaneous TID WC   Continuous Infusions:   LOS: 0 days    Time spent: 35 minutes    Edwin Dada, MD [facility] 11/22/2018,  1:57 PM     Pager [phone] --- please page though AMION:  [email or web address] Password [email or web address] If 7PM-7AM, please contact night-coverage

## 2018-11-22 NOTE — ED Notes (Signed)
BIPAP removed. Pt begging for it to be taken off despite education provided for why BIPAP needed at this time. Will inform Dr. Wyvonnia Dusky.

## 2018-11-22 NOTE — Progress Notes (Signed)
   11/22/18 1522  Vitals  ECG Heart Rate (!) 121  Oxygen Therapy  SpO2 (!) 83 %  O2 Device Room Air  MEWS Score  MEWS RR 0  MEWS Pulse 2  MEWS Systolic 0  MEWS LOC 0  MEWS Temp 0  MEWS Score 2  MEWS Score Color Yellow   Patient ambulated to the bathroom to have a BM with NT when he returned to the bed he was very short of breath with blue colored lips "I feel like I'm going to pass out". Patient placed on 2L nasal cannula by RN and helped back in bed. Oxygen stats now 95% and patient states he feels much better. Color is back to normal and patient is resting in bed. I will continue to monitor the patient closely.   Saddie Benders RN BSN

## 2018-11-23 DIAGNOSIS — J9601 Acute respiratory failure with hypoxia: Secondary | ICD-10-CM | POA: Diagnosis present

## 2018-11-23 DIAGNOSIS — Z888 Allergy status to other drugs, medicaments and biological substances status: Secondary | ICD-10-CM | POA: Diagnosis not present

## 2018-11-23 DIAGNOSIS — I447 Left bundle-branch block, unspecified: Secondary | ICD-10-CM | POA: Diagnosis present

## 2018-11-23 DIAGNOSIS — R9431 Abnormal electrocardiogram [ECG] [EKG]: Secondary | ICD-10-CM | POA: Diagnosis not present

## 2018-11-23 DIAGNOSIS — Z951 Presence of aortocoronary bypass graft: Secondary | ICD-10-CM | POA: Diagnosis not present

## 2018-11-23 DIAGNOSIS — Z96641 Presence of right artificial hip joint: Secondary | ICD-10-CM | POA: Diagnosis present

## 2018-11-23 DIAGNOSIS — N184 Chronic kidney disease, stage 4 (severe): Secondary | ICD-10-CM | POA: Diagnosis present

## 2018-11-23 DIAGNOSIS — E440 Moderate protein-calorie malnutrition: Secondary | ICD-10-CM | POA: Diagnosis present

## 2018-11-23 DIAGNOSIS — Z87891 Personal history of nicotine dependence: Secondary | ICD-10-CM | POA: Diagnosis not present

## 2018-11-23 DIAGNOSIS — Z20828 Contact with and (suspected) exposure to other viral communicable diseases: Secondary | ICD-10-CM | POA: Diagnosis present

## 2018-11-23 DIAGNOSIS — N183 Chronic kidney disease, stage 3 (moderate): Secondary | ICD-10-CM | POA: Diagnosis not present

## 2018-11-23 DIAGNOSIS — I2510 Atherosclerotic heart disease of native coronary artery without angina pectoris: Secondary | ICD-10-CM | POA: Diagnosis present

## 2018-11-23 DIAGNOSIS — E1122 Type 2 diabetes mellitus with diabetic chronic kidney disease: Secondary | ICD-10-CM | POA: Diagnosis present

## 2018-11-23 DIAGNOSIS — Z836 Family history of other diseases of the respiratory system: Secondary | ICD-10-CM | POA: Diagnosis not present

## 2018-11-23 DIAGNOSIS — I5033 Acute on chronic diastolic (congestive) heart failure: Secondary | ICD-10-CM | POA: Diagnosis present

## 2018-11-23 DIAGNOSIS — I130 Hypertensive heart and chronic kidney disease with heart failure and stage 1 through stage 4 chronic kidney disease, or unspecified chronic kidney disease: Secondary | ICD-10-CM | POA: Diagnosis present

## 2018-11-23 DIAGNOSIS — I5084 End stage heart failure: Secondary | ICD-10-CM | POA: Diagnosis present

## 2018-11-23 DIAGNOSIS — I2583 Coronary atherosclerosis due to lipid rich plaque: Secondary | ICD-10-CM | POA: Diagnosis not present

## 2018-11-23 DIAGNOSIS — I5043 Acute on chronic combined systolic (congestive) and diastolic (congestive) heart failure: Secondary | ICD-10-CM | POA: Diagnosis present

## 2018-11-23 DIAGNOSIS — Z8701 Personal history of pneumonia (recurrent): Secondary | ICD-10-CM | POA: Diagnosis not present

## 2018-11-23 DIAGNOSIS — E785 Hyperlipidemia, unspecified: Secondary | ICD-10-CM | POA: Diagnosis present

## 2018-11-23 DIAGNOSIS — Z880 Allergy status to penicillin: Secondary | ICD-10-CM | POA: Diagnosis not present

## 2018-11-23 DIAGNOSIS — Z6830 Body mass index (BMI) 30.0-30.9, adult: Secondary | ICD-10-CM | POA: Diagnosis not present

## 2018-11-23 DIAGNOSIS — Z79899 Other long term (current) drug therapy: Secondary | ICD-10-CM | POA: Diagnosis not present

## 2018-11-23 DIAGNOSIS — Z66 Do not resuscitate: Secondary | ICD-10-CM | POA: Diagnosis present

## 2018-11-23 DIAGNOSIS — E875 Hyperkalemia: Secondary | ICD-10-CM | POA: Diagnosis present

## 2018-11-23 LAB — BASIC METABOLIC PANEL
Anion gap: 8 (ref 5–15)
BUN: 59 mg/dL — ABNORMAL HIGH (ref 8–23)
CO2: 26 mmol/L (ref 22–32)
Calcium: 9.2 mg/dL (ref 8.9–10.3)
Chloride: 109 mmol/L (ref 98–111)
Creatinine, Ser: 1.84 mg/dL — ABNORMAL HIGH (ref 0.61–1.24)
GFR calc Af Amer: 35 mL/min — ABNORMAL LOW (ref >60)
GFR calc non Af Amer: 30 mL/min — ABNORMAL LOW (ref >60)
Glucose, Bld: 145 mg/dL — ABNORMAL HIGH (ref 70–99)
Potassium: 5.1 mmol/L (ref 3.5–5.1)
Sodium: 143 mmol/L (ref 135–145)

## 2018-11-23 LAB — GLUCOSE, CAPILLARY
Glucose-Capillary: 136 mg/dL — ABNORMAL HIGH (ref 70–99)
Glucose-Capillary: 123 mg/dL — ABNORMAL HIGH (ref 70–99)
Glucose-Capillary: 219 mg/dL — ABNORMAL HIGH (ref 70–99)
Glucose-Capillary: 151 mg/dL — ABNORMAL HIGH (ref 70–99)
Glucose-Capillary: 139 mg/dL — ABNORMAL HIGH (ref 70–99)

## 2018-11-23 MED ORDER — FUROSEMIDE 10 MG/ML IJ SOLN
40.0000 mg | Freq: Once | INTRAMUSCULAR | Status: AC
  Administered 2018-11-23: 40 mg via INTRAVENOUS
  Filled 2018-11-23: qty 4

## 2018-11-23 MED ORDER — PHENYLEPHRINE HCL-NACL 10-0.9 MG/250ML-% IV SOLN
INTRAVENOUS | Status: AC
  Filled 2018-11-23: qty 750

## 2018-11-23 MED ORDER — FUROSEMIDE 10 MG/ML IJ SOLN: 40.0000 mg | Freq: Every day | INTRAMUSCULAR | Status: DC

## 2018-11-23 MED ORDER — FUROSEMIDE 40 MG PO TABS: 40.0000 mg | ORAL_TABLET | Freq: Once | ORAL | Status: DC

## 2018-11-23 MED ORDER — POTASSIUM CHLORIDE CRYS ER 20 MEQ PO TBCR
20.0000 meq | EXTENDED_RELEASE_TABLET | Freq: Every day | ORAL | Status: DC
  Administered 2018-11-23: 20 meq via ORAL
  Filled 2018-11-23: qty 1

## 2018-11-23 MED ORDER — FUROSEMIDE 20 MG PO TABS
20.0000 mg | ORAL_TABLET | Freq: Every day | ORAL | Status: DC
  Administered 2018-11-24: 20 mg via ORAL
  Filled 2018-11-23: qty 1

## 2018-11-23 NOTE — Evaluation (Signed)
Physical Therapy Evaluation Patient Details Name: Lawrence Warner MRN: [id number] DOB: 04-06-1918 Today's Date: 11/23/2018   History of Present Illness  85 y.o. M with CAD s/p CABG in 2000, dCHF, HTN, DM, and CKD IV baseline Cr 1.4-2.0 who presents with recurrent dyspnea. Recently admitted with CHF flare, diuresed for 4 days, Returned home with Lasix and developed worsening SoB. Came to ED where  CXR showed small pleural effusion, no edema, BNP slightly up Admitted for Observation 11/22/18  Clinical Impression  Pt d/c'd from hospital 10 days ago. Pt reports that he has had HHPT, and is using the hospital bed, however has not been able to get a private pay HHA. Pt reports that he has been back to his normal routine going to the dining room on his scooter and getting around his home with his cane. Pt is limited in safe mobility by decreased strength and endurance. Pt currently min guard for bed mobility, minA for power up to RW and min guard for ambulation of 20 feet in room with RW. Pt is likely close to his most recent baseline, however PT recommends resumption of HHPT at discharge to continue to work on further improvement of strength and endurance. PT will continue to follow acutely.     Follow Up Recommendations Home health PT;Supervision - Intermittent    Equipment Recommendations  None recommended by PT       Precautions / Restrictions Precautions Precautions: Fall Precaution Comments: 2L O2 via [place] Restrictions Weight Bearing Restrictions: No      Mobility  Bed Mobility Overal bed mobility: Needs Assistance Bed Mobility: Supine to Sit     Supine to sit: HOB elevated;Min guard     General bed mobility comments: Increased time and effort for pt. He maximally elevated HOB in order to be able to get to EOB.  Transfers Overall transfer level: Needs assistance Equipment used: Rolling walker (2 wheeled) Transfers: Sit to/from Stand Sit to Stand: Min assist         General  transfer comment: minA for power up, vc for hand placement   Ambulation/Gait Ambulation/Gait assistance: Min guard Gait Distance (Feet): 20 Feet Assistive device: Rolling walker (2 wheeled)     Gait velocity interpretation: <1.31 ft/sec, indicative of household ambulator General Gait Details: min guard for safety to ambulate to sink to wash hands and to recliner for breakfast., slow steady gait, vc for staying withing RW to wash hands and not turn away from it        Balance Overall balance assessment: Needs assistance Sitting-balance support: No upper extremity supported;Feet supported Sitting balance-Leahy Scale: Good     Standing balance support: No upper extremity supported;During functional activity;Single extremity supported Standing balance-Leahy Scale: Fair                               Pertinent Vitals/Pain Pain Assessment: No/denies pain    Home Living Family/patient expects to be discharged to:: Assisted living Living Arrangements: Alone Available Help at Discharge: Family;Available PRN/intermittently Type of Home: Independent living facility Home Access: Level entry     Home Layout: One level Home Equipment: Walker - 2 wheels;Cane - single point;Electric scooter;Wheelchair - manual Additional Comments: from [employer]; uses scooter to dining room    Prior Function Level of Independence: Independent with assistive device(s)         Comments: independent amb with cane for short distances. facility provides meals and cleaning services  Extremity/Trunk Assessment   Upper Extremity Assessment Upper Extremity Assessment: Generalized weakness    Lower Extremity Assessment Lower Extremity Assessment: RLE deficits/detail;LLE deficits/detail RLE Deficits / Details: ROM WFL, strength grossly 3+-4-/5  LLE Deficits / Details: ROM WFL, strength grossly 3+-4-/5     Cervical / Trunk Assessment Cervical / Trunk Assessment: Other  exceptions Cervical / Trunk Exceptions: limited cervical movement due to prior fusions  Communication   Communication: No difficulties  Cognition Arousal/Alertness: Awake/alert Behavior During Therapy: WFL for tasks assessed/performed Overall Cognitive Status: Within Functional Limits for tasks assessed                                        General Comments General comments (skin integrity, edema, etc.): Pt on 1.5L O2 via [place] with SaO2 96%O2 on entry, switched to 2L for ambulation, poor pleth waveform but >92%O2 throughout ambulation. SaO2 in sitting on 2L 100%O2, decreased O2 back to 1.5L and able to maintain SaO2 >97%O2        Assessment/Plan    PT Assessment Patient needs continued PT services  PT Problem List Decreased strength;Decreased activity tolerance;Decreased balance;Decreased mobility;Decreased safety awareness       PT Treatment Interventions Gait training;Therapeutic exercise;Functional mobility training;Therapeutic activities;Patient/family education    PT Goals (Current goals can be found in the Care Plan section)  Acute Rehab PT Goals Patient Stated Goal: back to [facility] PT Goal Formulation: With patient Time For Goal Achievement: 12/07/18 Potential to Achieve Goals: Good    Frequency Min 3X/week   Barriers to discharge Decreased caregiver support         AM-PAC PT "6 Clicks" Mobility  Outcome Measure Help needed turning from your back to your side while in a flat bed without using bedrails?: A Little Help needed moving from lying on your back to sitting on the side of a flat bed without using bedrails?: A Little Help needed moving to and from a bed to a chair (including a wheelchair)?: A Little Help needed standing up from a chair using your arms (e.g., wheelchair or bedside chair)?: A Little Help needed to walk in hospital room?: A Little Help needed climbing 3-5 steps with a railing? : A Lot 6 Click Score: 17    End of  Session Equipment Utilized During Treatment: Gait belt Activity Tolerance: Patient tolerated treatment well Patient left: in chair;with call bell/phone within reach Nurse Communication: Mobility status PT Visit Diagnosis: Muscle weakness (generalized) (M62.81);Other abnormalities of gait and mobility (R26.89)    Time: 8638-1771 PT Time Calculation (min) (ACUTE ONLY): 19 min   Charges:   PT Evaluation $PT Eval Moderate Complexity: 1 Mod          Keanon Bevins B. Migdalia Dk PT, DPT Acute Rehabilitation Services Pager [phone] Office [phone]   [place] 11/23/2018, 10:04 AM

## 2018-11-23 NOTE — Plan of Care (Signed)
  Problem: Education: Goal: Ability to demonstrate management of disease process will improve Outcome: Progressing   Problem: Activity: Goal: Capacity to carry out activities will improve Outcome: Progressing   

## 2018-11-23 NOTE — Progress Notes (Signed)
PROGRESS NOTE    Lawrence Warner  CWC:[id number] DOB: 03-07-1918 DOA: 11/22/2018 PCP: Lawrence Cuff, MD      Brief Narrative:  Lawrence Warner is a 85 y.o. M with CAD s/p CABG in 2000, dCHF, HTN, DM, and CKD IV baseline Cr 1.4-2.0 who presents with recurrent dyspnea.  Patient recently admitted with CHF flare, diuresed for 4 days (admit weight 90Kg, discharge weight 88 kg).  Returned home, was taking Lasix when given by son, slowly developed worsening SOB until he had SOB at rest, orthopnea.  In the ER, CXR showed small pleural effusion, no edema, BNP slightly up.  Given Lasix with improvement in symptoms.      Assessment & Plan:  Acute on chronic diastolic CHF Recent echo shows normal EF, no sig valve disease.  Here BNP elevated, effusion on CXR without significant peripheral edema.  Started Lasix yesterday, net negative 500cc yesterday.  Cr and K improved.  Symptoms improved, but still hypoxic severely with exertion, see below. -Furosemide 40 mg IV repeat again today -K supplement -Strict I/Os, daily weights, telemetry  -Daily monitoring renal function    Ambulated with nursing while I was in the room O2 sat on RA at rest: 96% O2 sat on RA while ambulating/during exertion: 69% O2 sat on oxygen while ambulating/during exertion: 92%    CKD IV Improved from yesterday, stable relative to baseline  Coronary disease secondary prevention Hypertension BP mostly 120s to 150s.  One reading 99/57 overnight, likely spurious. -Hold losartan while diuresing -Continue simvastatin  Diabetes Glucoses controlled -Continue SSI corrections  Moderate protein calorie malnutrition Appears to be eating less than 50% meals.  Family note this is worsening over last 6 months.  Has also diffuse loss of subcutaneous fat.        MDM and disposition Plan:  The below labs and imaging reports reviewed and summarized above.  Medication management as above.  The patient was admitted  with a CHF flare.    At this time, continued inpatient services are reasonable and expected, given the patient's age, close readmission after last hospitalization, ongoing IV lasix, and severe desaturation (to 69% on room air with ambulating just 10-15 feet).           DVT prophylaxis: Lovenox Code Status: DO NOT RESUSCITATE, discussed with son Family Communication: Son at bedside     Subjective:  The patient feels better.  He denies swelling, fever, cough, sputum.  He has chronic orthopnea.   Objective: Vitals:   11/22/18 1632 11/22/18 1954 11/22/18 2020 11/23/18 0418  BP:   (!) 99/57 (!) 158/72  Pulse: (!) 105 100 96 73  Resp:      Temp:   99.3 F (37.4 C) 97.7 F (36.5 C)  TempSrc:   Oral Oral  SpO2: 94% 95% 95% 98%  Weight:    85.4 kg  Height:        Intake/Output Summary (Last 24 hours) at 11/23/2018 0838 Last data filed at 11/23/2018 0009 Gross per 24 hour  Intake 720 ml  Output 1250 ml  Net -530 ml   Filed Weights   11/22/18 0127 11/23/18 0418  Weight: 88.5 kg 85.4 kg    Examination: General appearance: Adult male, sitting in recliner, no acute distress.  Interactive.  HEENT: Anicteric, conjunctival pink, lids and lashes normal.  No nasal deformity, discharge, or epistaxis.  Lips normal, dentures in place, loose.  Oropharynx tacky dry, no oral lesions, hearing diminished. Skin: No suspicious rashes or lesions. Cardiac: RRR, no murmurs  appreciated, no lower extremity edema, JVP not visible in the sitting position. Respiratory: Respiratory rate comfortable at rest, dyspneic with exertion.  Rales at right base, diminished in the left base.  No wheezing. Abdomen: Abdomen soft, no tenderness to palpation or guarding.  No ascites. MSK: No deformities or effusions of the large joints of the upper or lower extremities bilaterally.  Diffuse loss of subcutaneous muscle mass and fat. Neuro: Awake and alert, extraocular movements intact, moves all extremities with  normal strength and coordination, speech fluent. Psych: Insight intact responding to questions, attention normal, affect normal, judgment insight appear normal.      Data Reviewed: I have personally reviewed following labs and imaging studies:  CBC: Recent Labs  Lab 11/22/18 0241 11/22/18 0519  WBC 6.4  --   NEUTROABS 4.5  --   HGB 13.1 12.6*  HCT 40.8 37.0*  MCV 98.8  --   PLT 127*  --    Basic Metabolic Panel: Recent Labs  Lab 11/22/18 0241 11/22/18 0456 11/22/18 0519  NA 143  --  144  K 5.2*  --  4.8  CL 112*  --   --   CO2 20*  --   --   GLUCOSE 142*  --   --   BUN 66*  --   --   CREATININE 2.06*  --   --   CALCIUM 9.1  --   --   MG  --  2.4  --    GFR: Estimated Creatinine Clearance: 21.4 mL/min (A) (by C-G formula based on SCr of 2.06 mg/dL (H)). Liver Function Tests: Recent Labs  Lab 11/22/18 0241  AST 16  ALT 16  ALKPHOS 54  BILITOT 0.4  PROT 6.4*  ALBUMIN 2.8*   No results for input(s): LIPASE, AMYLASE in the last 168 hours. No results for input(s): AMMONIA in the last 168 hours. Coagulation Profile: No results for input(s): INR, PROTIME in the last 168 hours. Cardiac Enzymes: No results for input(s): CKTOTAL, CKMB, CKMBINDEX, TROPONINI in the last 168 hours. BNP (last 3 results) No results for input(s): PROBNP in the last 8760 hours. HbA1C: No results for input(s): HGBA1C in the last 72 hours. CBG: Recent Labs  Lab 11/22/18 1626 11/22/18 2017 11/23/18 0721  GLUCAP 198* 176* 123*   Lipid Profile: No results for input(s): CHOL, HDL, LDLCALC, TRIG, CHOLHDL, LDLDIRECT in the last 72 hours. Thyroid Function Tests: No results for input(s): TSH, T4TOTAL, FREET4, T3FREE, THYROIDAB in the last 72 hours. Anemia Panel: No results for input(s): VITAMINB12, FOLATE, FERRITIN, TIBC, IRON, RETICCTPCT in the last 72 hours. Urine analysis:    Component Value Date/Time   COLORURINE YELLOW 11/11/2018 1747   APPEARANCEUR CLEAR 11/11/2018 1747    LABSPEC 1.018 11/11/2018 1747   PHURINE 6.0 11/11/2018 1747   GLUCOSEU NEGATIVE 11/11/2018 1747   HGBUR NEGATIVE 11/11/2018 1747   BILIRUBINUR NEGATIVE 11/11/2018 1747   KETONESUR NEGATIVE 11/11/2018 1747   PROTEINUR 100 (A) 11/11/2018 1747   NITRITE NEGATIVE 11/11/2018 1747   LEUKOCYTESUR NEGATIVE 11/11/2018 1747   Sepsis Labs: @LABRCNTIP (procalcitonin:4,lacticacidven:4)  ) Recent Results (from the past 240 hour(s))  SARS Coronavirus 2 [facility] order, Performed in [facility] hospital lab) Nasopharyngeal Nasopharyngeal Swab     Status: None   Collection Time: 11/22/18  2:16 AM   Specimen: Nasopharyngeal Swab  Result Value Ref Range Status   SARS Coronavirus 2 NEGATIVE NEGATIVE Final    Comment: (NOTE) If result is NEGATIVE SARS-CoV-2 target nucleic acids are NOT DETECTED. The SARS-CoV-2  RNA is generally detectable in upper and lower  respiratory specimens during the acute phase of infection. The lowest  concentration of SARS-CoV-2 viral copies this assay can detect is 250  copies / mL. A negative result does not preclude SARS-CoV-2 infection  and should not be used as the sole basis for treatment or other  patient management decisions.  A negative result may occur with  improper specimen collection / handling, submission of specimen other  than nasopharyngeal swab, presence of viral mutation(s) within the  areas targeted by this assay, and inadequate number of viral copies  (<250 copies / mL). A negative result must be combined with clinical  observations, patient history, and epidemiological information. If result is POSITIVE SARS-CoV-2 target nucleic acids are DETECTED. The SARS-CoV-2 RNA is generally detectable in upper and lower  respiratory specimens dur ing the acute phase of infection.  Positive  results are indicative of active infection with SARS-CoV-2.  Clinical  correlation with patient history and other diagnostic information is  necessary to determine patient  infection status.  Positive results do  not rule out bacterial infection or co-infection with other viruses. If result is PRESUMPTIVE POSTIVE SARS-CoV-2 nucleic acids MAY BE PRESENT.   A presumptive positive result was obtained on the submitted specimen  and confirmed on repeat testing.  While 2019 novel coronavirus  (SARS-CoV-2) nucleic acids may be present in the submitted sample  additional confirmatory testing may be necessary for epidemiological  and / or clinical management purposes  to differentiate between  SARS-CoV-2 and other Sarbecovirus currently known to infect humans.  If clinically indicated additional testing with an alternate test  methodology [phone]) is advised. The SARS-CoV-2 RNA is generally  detectable in upper and lower respiratory sp ecimens during the acute  phase of infection. The expected result is Negative. Fact Sheet for Patients:  StrictlyIdeas.no Fact Sheet for Healthcare Providers: BankingDealers.co.za This test is not yet approved or cleared by the [place] FDA and has been authorized for detection and/or diagnosis of SARS-CoV-2 by FDA under an Emergency Use Authorization (EUA).  This EUA will remain in effect (meaning this test can be used) for the duration of the COVID-19 declaration under Section 564(b)(1) of the Act, 21 U.S.C. section 360bbb-3(b)(1), unless the authorization is terminated or revoked sooner. Performed at [facility], [place] [address] [place], [place] [address]          Radiology Studies: Dg Chest Portable 1 View  Result Date: 11/22/2018 CLINICAL DATA:  Shortness of breath. Recent hospital admission for same. EXAM: PORTABLE CHEST 1 VIEW COMPARISON:  Radiograph 11/11/2018. V/Q scan 11/12/2018 FINDINGS: Post median sternotomy. Similar cardiomegaly. Small left pleural effusion not significantly changed from prior. Minor bibasilar atelectasis. Minimal fluid in the right  minor fissure. No pulmonary edema or acute airspace disease. No pneumothorax. No acute osseous abnormalities. IMPRESSION: 1. Stable cardiomegaly and small left pleural effusion. 2. Minor bibasilar atelectasis. Electronically Signed   By: Keith Rake M.D.   On: 11/22/2018 02:40        Scheduled Meds: . brimonidine  1 drop Both Eyes TID  . cycloSPORINE  1 drop Both Eyes Q12H  . enoxaparin (LOVENOX) injection  30 mg Subcutaneous Q24H  . furosemide  40 mg Intravenous Daily  . insulin aspart  0-9 Units Subcutaneous TID WC  . simvastatin  20 mg Oral QHS   Continuous Infusions:   LOS: 0 days    Time spent: 25 minutes    Edwin Dada, MD  [facility] 11/23/2018, 8:38 AM     Pager [phone] --- please page though AMION:  [email or web address] If 7PM-7AM, please contact night-coverage

## 2018-11-23 NOTE — Care Management (Signed)
11-23-18 1604 CM did send information to [facility] to fax: [phone] for Oxygen via the [place]. Hopefully CM will hear back from [place] regarding approval on 11-24-18. Bethena Roys, RN,BSN Case Manager [phone]

## 2018-11-23 NOTE — Progress Notes (Signed)
SATURATION QUALIFICATIONS: (This note is used to comply with regulatory documentation for home oxygen)  Patient Saturations on Room Air at Rest = 98 %  Patient Saturations on Room Air while Ambulating =   72 %  Patient Saturations on 2  Liters of oxygen while Ambulating = 93 %  Please briefly explain why patient needs home oxygen:

## 2018-11-24 ENCOUNTER — Inpatient Hospital Stay: Payer: Medicare Other

## 2018-11-24 LAB — BASIC METABOLIC PANEL
Anion gap: 10 (ref 5–15)
BUN: 65 mg/dL — ABNORMAL HIGH (ref 8–23)
CO2: 21 mmol/L — ABNORMAL LOW (ref 22–32)
Calcium: 9.2 mg/dL (ref 8.9–10.3)
Chloride: 109 mmol/L (ref 98–111)
Creatinine, Ser: 2.01 mg/dL — ABNORMAL HIGH (ref 0.61–1.24)
GFR calc Af Amer: 31 mL/min — ABNORMAL LOW (ref >60)
GFR calc non Af Amer: 27 mL/min — ABNORMAL LOW (ref >60)
Glucose, Bld: 125 mg/dL — ABNORMAL HIGH (ref 70–99)
Potassium: 5.4 mmol/L — ABNORMAL HIGH (ref 3.5–5.1)
Sodium: 140 mmol/L (ref 135–145)

## 2018-11-24 LAB — GLUCOSE, CAPILLARY
Glucose-Capillary: 121 mg/dL — ABNORMAL HIGH (ref 70–99)
Glucose-Capillary: 132 mg/dL — ABNORMAL HIGH (ref 70–99)

## 2018-11-24 NOTE — TOC Initial Note (Signed)
Transition of Care [facility]) - Initial/Assessment Note    Patient Details  Name: Lawrence Warner MRN: [id number] Date of Birth: 16-Feb-1919  Transition of Care [facility] CM/SW Contact:    Bethena Roys, RN Phone Number: 11/24/2018, 11:43 AM  Clinical Narrative: CSW initially following the patient. CM was asked to set up home oxygen for the patient. Son [name] wants to go through the [facility] for DME 02 to be delivered via [place]. CM did fax orders on 11-23-18 no return phone call as of yet. CM has made several phone calls to the patients CSW at the [facility] [phone] ext [phone]. Unable to talk with CSW- Family has questions regarding the homemaker aide program and long term care providers. Patient is at the [facility]. CM did add Aide and CSW to the [facility] with [place]. Unable to be a candidate for [place]. The CSW with [facility] can assist with ALF if the patient needs once he returns to [place]. CM awaiting return phone calls from the [place] and [place]. CM to discuss with patient if we can use Medicare Benefit for 02. Patient previously declined the use of Medicare Benefit and wanted to use the [place]. [place] to see the patient 11-25-18 in am. Following for additional needs.                   Expected Discharge Plan: [place] Barriers to Discharge: No Barriers Identified   Patient Goals and CMS Choice Patient states their goals for this hospitalization and ongoing recovery are:: "to return home" CMS Medicare.gov Compare Post Acute Care list provided to:: Patient Choice offered to / list presented to : Patient  Expected Discharge Plan and Services Expected Discharge Plan: [place] In-house Referral: NA Discharge Planning Services: CM Consult Post Acute Care Choice: Resumption of Svcs/PTA Provider, Home Health Living arrangements for the past 2 months: [place] Expected Discharge Date: 11/24/18                 DME Agency: [place] Date DME Agency Contacted: 11/24/18 Time DME Agency Contacted: 0900 Representative spoke with at DME Agency: [place] for [place] Arranged: RN, Disease Management, PT, OT [place] Agency: [place] Date [place]: 11/24/18 Time [place]: 1140 Representative spoke with at [place]: [place](Not a candidate for [facility].)  Prior Living Arrangements/Services Living arrangements for the past 2 months: [place] Lives with:: Facility Resident   Do you feel safe going back to the place where you live?: Yes      Need for Family Participation in Patient Care: Yes (Comment) Care giver support system in place?: Yes (comment) Current home services: Home RN, Home PT, Home OT(Active with [facility]) Criminal Activity/Legal Involvement Pertinent to Current Situation/Hospitalization: No - Comment as needed  Activities of Daily Living Home Assistive Devices/Equipment: Cane (specify quad or straight) ADL Screening (condition at time of admission) Patient's cognitive ability adequate to safely complete daily activities?: Yes Is the patient deaf or have difficulty hearing?: Yes Does the patient have difficulty seeing, even when wearing glasses/contacts?: No Does the patient have difficulty concentrating, remembering, or making decisions?: No Patient able to express need for assistance with ADLs?: Yes Does the patient have difficulty dressing or bathing?: Yes Independently performs ADLs?: Yes (appropriate for developmental age) Communication: Independent Dressing (OT): Needs assistance Is this a change from baseline?: Pre-admission baseline Grooming: Needs  assistance Is this a change from baseline?: Pre-admission baseline Toileting: Appropriate for developmental age In/Out Bed: Needs assistance Is this a change from baseline?: Pre-admission  baseline Does the patient have difficulty walking or climbing stairs?: Yes Weakness of Legs: Both Weakness of Arms/Hands: None  Permission Sought/Granted Permission sought to share information with : Case Manager, Customer service manager, Family Supports    Share Information with NAME: Floyd Wade  Permission granted to share info w AGENCY: [facility], [facility]        Emotional Assessment Appearance:: Appears stated age Attitude/Demeanor/Rapport: Engaged Affect (typically observed): Accepting Orientation: : Oriented to Self, Oriented to Place, Oriented to  Time, Oriented to Situation Alcohol / Substance Use: Not Applicable Psych Involvement: No (comment)  Admission diagnosis:  Acute on chronic diastolic congestive heart failure (HCC) [I50.33] Acute respiratory failure with hypoxia (HCC) [J96.01] Patient Active Problem List   Diagnosis Date Noted  . Acute on chronic combined systolic and diastolic heart failure ([place]) 11/23/2018  . Acute exacerbation of CHF (congestive heart failure) ([place]) 11/22/2018  . CAD (coronary artery disease) 11/22/2018  . Type 2 diabetes mellitus ([place]) 11/22/2018  . QT prolongation 11/22/2018  . CHF (congestive heart failure) ([place]) 11/12/2018  . Coronary artery disease involving native coronary artery of native heart without angina pectoris 01/08/2018  . Generalized weakness 05/17/2017  . Hypertensive emergency 05/17/2017  . RSV bronchitis   . Bronchospasm with bronchitis, acute 03/14/2017  . Acute on chronic diastolic CHF (congestive heart failure) ([place]) 11/26/2016  . CKD (chronic kidney disease) stage 3, GFR 30-59 ml/min (HCC) 11/26/2016  . LBBB (left bundle branch block) 11/26/2016  . Hypoxia   . Acute respiratory failure with hypoxia ([place]) 07/20/2016  . Chronic respiratory failure with hypoxia ([place]) 02/28/2016  . Dyslipidemia 02/28/2016  . Constipation 12/07/2015  . Bradycardia 11/28/2015  . Port catheter in place 10/24/2015  .  GERD (gastroesophageal reflux disease) 10/09/2015  . Gait instability 10/09/2015  . Chest pain 10/05/2015  . Diet-controlled diabetes mellitus ([place]) 05/07/2015  . Hx of CABG 05/07/2015  . Essential hypertension 05/07/2015  . Muscle cramps 04/25/2015  . Shortness of breath 04/11/2015  . Idiopathic thrombocytopenic purpura ([place]) 09/27/2014   PCP:  Clovia Cuff, MD Pharmacy:   [place], [place] [address] [place] [address] Phone: [phone] Fax: [phone]  [facility] [address] [place] [address] [place] [place] [address] Phone: [phone] Fax: [phone]     Social Determinants of Health (SDOH) Interventions    Readmission Risk Interventions Readmission Risk Prevention Plan 11/24/2018  Transportation Screening Complete  Home Care Screening Complete  Medication Review (RN CM) Complete  Some recent data might be hidden

## 2018-11-24 NOTE — Discharge Summary (Signed)
Physician Discharge Summary  Horrace Hanak ZOX:[id number] DOB: Jul 15, 1918 DOA: 11/22/2018  PCP: Clovia Cuff, MD  Admit date: 11/22/2018 Discharge date: 11/24/2018  Admitted From: Independent Living  Disposition:  Independent Living with Home Health   Recommendations for Outpatient Follow-up:  1. Follow up with PCP in 1 week 2. Please obtain BMP in one week 3. If patient continues to decline clinically, please refer for Hospice      Home Health: Yes, RN, PT/OT, aide, SW  Equipment/Devices: Walker, Oxygen with Portable concentrator  Discharge Condition: Fair  CODE STATUS: DO NOT RESUSCITATE Diet recommendation: Cardiac, low sodium       Brief/Interim Summary: Mr. Kimbrell is a 85 y.o. M with CAD s/p CABG in 2000, dCHF, HTN, DM, and CKD IV baseline Cr 1.4-2.0 who presents with recurrent dyspnea.  Patient recently admitted with CHF flare, diuresed for 4 days (admit weight 90Kg, discharge weight 88 kg).  Returned home, was taking Lasix when given by son, slowly developed worsening SOB until he had SOB at rest, orthopnea.  In the ER, CXR showed small pleural effusion, no edema, BNP slightly up.  Given Lasix with improvement in symptoms.     PRINCIPAL HOSPITAL DIAGNOSIS:  Acute on chronic diastolic CHF, end stage       Discharge Diagnoses:   Acute on chronic diastolic CHF Patient admitted with dyspnea, effusions on CXR, elevated BNP.  Recent echo (during last hospitalization) showed normal EF, no sig valve disease.    Started on Lasix yesterday, net negative 1.5L, weight down to 86.2 Kg at discharge.  Counseled on weight monitoring, adherence to Lasix 20 daily.  Ordered HH RN to reinforce med mgmt and weights.    Discharged on O2.  At his age, with his renal function, with his desaturations with ambulation and limited functional capacity (he can walk about 20 feet at most), he is likely nearing end stage.  This was discussed with  son.   Hyperkalemia No K supplement at discharge. -Check BMP in 1 week  CKD IV Baseline Cr around 2 mg/dL.  This underscores the advanced nature of his heart failure, and the overall worsening prognosis.  Coronary disease secondary prevention Hypertension  Diabetes Glucoses controlled here with sliding scale corrections.  Moderate protein calorie malnutrition Appears to be eating less than 50% meals.  Family note this is worsening over last 6 months.  Has also diffuse loss of subcutaneous fat.             Discharge Instructions  Discharge Instructions    Diet - low sodium heart healthy   Complete by: As directed    Discharge instructions   Complete by: As directed    From Dr. Loleta Books: Resume your Lasix 20 mg in the morning  Don't take your potassium supplement or your Losartan until your primary care doctor checks your lab work Have your primary care doctor (or the doctor at your independent living facility for now if you are planning to establish with a new primary care) check your labs in 1 week   Increase activity slowly   Complete by: As directed      Allergies as of 11/24/2018      Reactions   Alfuzosin Hcl Er Other (See Comments)   Confusion/Uroxatral   Imdur [isosorbide Dinitrate] Other (See Comments)   REACTION NOT RECALLED   Penicillins Hives, Other (See Comments)   Has patient had a PCN reaction causing immediate rash, facial/tongue/throat swelling, SOB or lightheadedness with hypotension: Yes Has patient had a  PCN reaction causing severe rash involving mucus membranes or skin necrosis: Unknown Has patient had a PCN reaction that required hospitalization: Unknown Has patient had a PCN reaction occurring within the last 10 years: Unknown If all of the above answers are "NO", then may proceed with Cephalosporin use.   Tape Other (See Comments)   SKIN IS VERY THIN AND WILL TEAR EASILY      Medication List    STOP taking these medications    losartan 100 MG tablet Commonly known as: COZAAR     TAKE these medications   acetaminophen 500 MG tablet Commonly known as: TYLENOL Take 500-1,000 mg by mouth every 6 (six) hours as needed for mild pain.   ARTIFICIAL TEARS PF OP Place 1 drop into both eyes 4 (four) times daily.   brimonidine 0.2 % ophthalmic solution Commonly known as: ALPHAGAN Place 1 drop into both eyes 3 (three) times daily.   cycloSPORINE 0.05 % ophthalmic emulsion Commonly known as: RESTASIS Place 1 drop into both eyes every 12 (twelve) hours.   furosemide 20 MG tablet Commonly known as: LASIX Take 1 tablet (20 mg total) by mouth daily.   magnesium oxide 400 MG tablet Commonly known as: MAG-OX Take 400 mg daily by mouth.   multivitamin with minerals Tabs tablet Take 1 tablet by mouth daily.   simvastatin 20 MG tablet Commonly known as: ZOCOR Take 20 mg by mouth at bedtime.            Durable Medical Equipment  (From admission, onward)         Start     Ordered   11/24/18 1158  DME Oxygen  Once    Comments: POC  Question Answer Comment  Length of Need 6 Months   Mode or (Route) Nasal cannula   Liters per Minute 2   Frequency Continuous (stationary and portable oxygen unit needed)   Oxygen conserving device Yes   Oxygen delivery system Gas      11/24/18 1157         Follow-up Information    Dr. Verlin Fester.   Why: Follow up appointment:  Doctor will visit patient at [facility] tomorrow Wednesday, 11/25/18 between the hours of 12:30 - 2:30 .       [employer] [employer].   Why: Oxygen to be delivered to the room prior to transition home.  Contact information: [place] [address] [phone]        Care, [facility].   Specialty: Home Health Services Why: Registered Nurse, Physical and Occupational Therapies, Aide and Social Worker-office to call with appointment time Contact information: [place]  [address] [phone]          Allergies  Allergen Reactions  . Alfuzosin Hcl Er Other (See Comments)    Confusion/Uroxatral   . Imdur [Isosorbide Dinitrate] Other (See Comments)    REACTION NOT RECALLED  . Penicillins Hives and Other (See Comments)    Has patient had a PCN reaction causing immediate rash, facial/tongue/throat swelling, SOB or lightheadedness with hypotension: Yes Has patient had a PCN reaction causing severe rash involving mucus membranes or skin necrosis: Unknown Has patient had a PCN reaction that required hospitalization: Unknown Has patient had a PCN reaction occurring within the last 10 years: Unknown If all of the above answers are "NO", then may proceed with Cephalosporin use.   . Tape Other (See Comments)    SKIN IS VERY THIN AND WILL  TEAR EASILY    Consultations:  None   Procedures/Studies: Dg Chest 2 View  Result Date: 11/11/2018 CLINICAL DATA:  85 year old male with chest pain and shortness of breath on the left for the past 2 days. EXAM: CHEST - 2 VIEW COMPARISON:  Prior chest x-ray 02/06/2018 FINDINGS: Stable mild cardiomegaly. Patient is status post median sternotomy with evidence of prior multivessel CABG. Slight interval increase in pulmonary vascular congestion now bordering on mild interstitial edema. Small left pleural effusion. No pneumothorax. No focal airspace infiltrate. No acute osseous abnormality. Band 2 spine suggests ankylosing spondylosis. IMPRESSION: 1. Mild CHF. 2. Small left pleural effusion. 3. Ankylosing spondylosis. Electronically Signed   By: Jacqulynn Cadet M.D.   On: 11/11/2018 19:28   Nm Pulmonary Perf And Vent  Result Date: 11/12/2018 CLINICAL DATA:  Shortness of breath and chest pain EXAM: NUCLEAR MEDICINE VENTILATION - PERFUSION LUNG SCAN VIEWS: Anterior, posterior, left lateral, right lateral, RPO, LPO, RAO, LAO-ventilation and perfusion RADIOPHARMACEUTICALS:  31.0 6 mCi of Tc-52m DTPA aerosol inhalation and mCi Tc64m  MAA IV COMPARISON:  Chest radiograph November 11, 2018 FINDINGS: Ventilation: There are foci of decreased uptake in each upper lobe, more focal in the posterior segment left lobe region on the left than on the right. The remainder the ventilation study appears unremarkable. Perfusion: There are upper lobe areas of photopenia, more notable on the left than on the right, which match ventilation defects. No appreciable ventilation/perfusion mismatch. IMPRESSION: Matching upper lobe defects, larger on the left than on the right. No appreciable ventilation/perfusion mismatch. This study constitutes an overall low probability of pulmonary embolus based on PIOPED II criteria. Electronically Signed   By: Lowella Grip III M.D.   On: 11/12/2018 16:15   Dg Chest Portable 1 View  Result Date: 11/22/2018 CLINICAL DATA:  Shortness of breath. Recent hospital admission for same. EXAM: PORTABLE CHEST 1 VIEW COMPARISON:  Radiograph 11/11/2018. V/Q scan 11/12/2018 FINDINGS: Post median sternotomy. Similar cardiomegaly. Small left pleural effusion not significantly changed from prior. Minor bibasilar atelectasis. Minimal fluid in the right minor fissure. No pulmonary edema or acute airspace disease. No pneumothorax. No acute osseous abnormalities. IMPRESSION: 1. Stable cardiomegaly and small left pleural effusion. 2. Minor bibasilar atelectasis. Electronically Signed   By: Keith Rake M.D.   On: 11/22/2018 02:40      Subjective: Feeling better.  Appetite good.  No fever, cough, sputum.  No confusion, falls.  No orthonpnea, no swelling.  Discharge Exam: Vitals:   11/23/18 2042 11/24/18 0630  BP: 115/61 (!) 144/74  Pulse: 68 63  Resp:    Temp: 97.6 F (36.4 C) 98.1 F (36.7 C)  SpO2: 97% 98%   Vitals:   11/23/18 0418 11/23/18 1422 11/23/18 2042 11/24/18 0630  BP: (!) 158/72 126/72 115/61 (!) 144/74  Pulse: 73  68 63  Resp:      Temp: 97.7 F (36.5 C)  97.6 F (36.4 C) 98.1 F (36.7 C)  TempSrc:  Oral  Oral Oral  SpO2: 98%  97% 98%  Weight: 85.4 kg   86.2 kg  Height:        General: Pt is alert, awake, not in acute distress, lying in bed, sleeing but easily rousable, dentures missing. Cardiovascular: RRR, nl S1-S2, no murmurs appreciated.   No LE edema.   Respiratory: Normal respiratory rate and rhythm.  CTAB without rales or wheezes. Abdominal: Abdomen soft and non-tender.  No distension or HSM.   Neuro/Psych: Strength symmetric in upper and lower extremities.  Judgment and insight appear normal.   The results of significant diagnostics from this hospitalization (including imaging, microbiology, ancillary and laboratory) are listed below for reference.     Microbiology: Recent Results (from the past 240 hour(s))  SARS Coronavirus 2 [facility] order, Performed in [facility] hospital lab) Nasopharyngeal Nasopharyngeal Swab     Status: None   Collection Time: 11/22/18  2:16 AM   Specimen: Nasopharyngeal Swab  Result Value Ref Range Status   SARS Coronavirus 2 NEGATIVE NEGATIVE Final    Comment: (NOTE) If result is NEGATIVE SARS-CoV-2 target nucleic acids are NOT DETECTED. The SARS-CoV-2 RNA is generally detectable in upper and lower  respiratory specimens during the acute phase of infection. The lowest  concentration of SARS-CoV-2 viral copies this assay can detect is 250  copies / mL. A negative result does not preclude SARS-CoV-2 infection  and should not be used as the sole basis for treatment or other  patient management decisions.  A negative result may occur with  improper specimen collection / handling, submission of specimen other  than nasopharyngeal swab, presence of viral mutation(s) within the  areas targeted by this assay, and inadequate number of viral copies  (<250 copies / mL). A negative result must be combined with clinical  observations, patient history, and epidemiological information. If result is POSITIVE SARS-CoV-2 target nucleic acids are  DETECTED. The SARS-CoV-2 RNA is generally detectable in upper and lower  respiratory specimens dur ing the acute phase of infection.  Positive  results are indicative of active infection with SARS-CoV-2.  Clinical  correlation with patient history and other diagnostic information is  necessary to determine patient infection status.  Positive results do  not rule out bacterial infection or co-infection with other viruses. If result is PRESUMPTIVE POSTIVE SARS-CoV-2 nucleic acids MAY BE PRESENT.   A presumptive positive result was obtained on the submitted specimen  and confirmed on repeat testing.  While 2019 novel coronavirus  (SARS-CoV-2) nucleic acids may be present in the submitted sample  additional confirmatory testing may be necessary for epidemiological  and / or clinical management purposes  to differentiate between  SARS-CoV-2 and other Sarbecovirus currently known to infect humans.  If clinically indicated additional testing with an alternate test  methodology [phone]) is advised. The SARS-CoV-2 RNA is generally  detectable in upper and lower respiratory sp ecimens during the acute  phase of infection. The expected result is Negative. Fact Sheet for Patients:  StrictlyIdeas.no Fact Sheet for Healthcare Providers: BankingDealers.co.za This test is not yet approved or cleared by the [place] FDA and has been authorized for detection and/or diagnosis of SARS-CoV-2 by FDA under an Emergency Use Authorization (EUA).  This EUA will remain in effect (meaning this test can be used) for the duration of the COVID-19 declaration under Section 564(b)(1) of the Act, 21 U.S.C. section 360bbb-3(b)(1), unless the authorization is terminated or revoked sooner. Performed at [facility], [place] [address] [place], [place] [address]      Labs: BNP (last 3 results) Recent Labs    11/11/18 1821 11/22/18 0241  BNP 454.9* 300.1*    Basic Metabolic Panel: Recent Labs  Lab 11/22/18 0241 11/22/18 0456 11/22/18 0519 11/23/18 0811 11/24/18 0325  NA 143  --  144 143 140  K 5.2*  --  4.8 5.1 5.4*  CL 112*  --   --  109 109  CO2 20*  --   --  26 21*  GLUCOSE 142*  --   --  145* 125*  BUN 66*  --   --  59* 65*  CREATININE 2.06*  --   --  1.84* 2.01*  CALCIUM 9.1  --   --  9.2 9.2  MG  --  2.4  --   --   --    Liver Function Tests: Recent Labs  Lab 11/22/18 0241  AST 16  ALT 16  ALKPHOS 54  BILITOT 0.4  PROT 6.4*  ALBUMIN 2.8*   No results for input(s): LIPASE, AMYLASE in the last 168 hours. No results for input(s): AMMONIA in the last 168 hours. CBC: Recent Labs  Lab 11/22/18 0241 11/22/18 0519  WBC 6.4  --   NEUTROABS 4.5  --   HGB 13.1 12.6*  HCT 40.8 37.0*  MCV 98.8  --   PLT 127*  --    Cardiac Enzymes: No results for input(s): CKTOTAL, CKMB, CKMBINDEX, TROPONINI in the last 168 hours. BNP: Invalid input(s): POCBNP CBG: Recent Labs  Lab 11/23/18 1104 11/23/18 1620 11/23/18 2039 11/24/18 0720 11/24/18 1146  GLUCAP 219* 151* 139* 121* 132*   D-Dimer No results for input(s): DDIMER in the last 72 hours. Hgb A1c No results for input(s): HGBA1C in the last 72 hours. Lipid Profile No results for input(s): CHOL, HDL, LDLCALC, TRIG, CHOLHDL, LDLDIRECT in the last 72 hours. Thyroid function studies No results for input(s): TSH, T4TOTAL, T3FREE, THYROIDAB in the last 72 hours.  Invalid input(s): FREET3 Anemia work up No results for input(s): VITAMINB12, FOLATE, FERRITIN, TIBC, IRON, RETICCTPCT in the last 72 hours. Urinalysis    Component Value Date/Time   COLORURINE YELLOW 11/11/2018 1747   APPEARANCEUR CLEAR 11/11/2018 1747   LABSPEC 1.018 11/11/2018 1747   PHURINE 6.0 11/11/2018 1747   GLUCOSEU NEGATIVE 11/11/2018 1747   HGBUR NEGATIVE 11/11/2018 1747   BILIRUBINUR NEGATIVE 11/11/2018 1747   KETONESUR NEGATIVE 11/11/2018 1747   PROTEINUR 100 (A) 11/11/2018 1747   NITRITE  NEGATIVE 11/11/2018 1747   LEUKOCYTESUR NEGATIVE 11/11/2018 1747   Sepsis Labs Invalid input(s): PROCALCITONIN,  WBC,  LACTICIDVEN Microbiology Recent Results (from the past 240 hour(s))  SARS Coronavirus 2 [facility] order, Performed in [facility] hospital lab) Nasopharyngeal Nasopharyngeal Swab     Status: None   Collection Time: 11/22/18  2:16 AM   Specimen: Nasopharyngeal Swab  Result Value Ref Range Status   SARS Coronavirus 2 NEGATIVE NEGATIVE Final    Comment: (NOTE) If result is NEGATIVE SARS-CoV-2 target nucleic acids are NOT DETECTED. The SARS-CoV-2 RNA is generally detectable in upper and lower  respiratory specimens during the acute phase of infection. The lowest  concentration of SARS-CoV-2 viral copies this assay can detect is 250  copies / mL. A negative result does not preclude SARS-CoV-2 infection  and should not be used as the sole basis for treatment or other  patient management decisions.  A negative result may occur with  improper specimen collection / handling, submission of specimen other  than nasopharyngeal swab, presence of viral mutation(s) within the  areas targeted by this assay, and inadequate number of viral copies  (<250 copies / mL). A negative result must be combined with clinical  observations, patient history, and epidemiological information. If result is POSITIVE SARS-CoV-2 target nucleic acids are DETECTED. The SARS-CoV-2 RNA is generally detectable in upper and lower  respiratory specimens dur ing the acute phase of infection.  Positive  results are indicative of active infection with SARS-CoV-2.  Clinical  correlation with patient history and other diagnostic information is  necessary to determine patient infection status.  Positive results do  not rule out bacterial infection or co-infection with other viruses. If result is PRESUMPTIVE POSTIVE SARS-CoV-2 nucleic acids MAY BE PRESENT.   A presumptive positive result was obtained on the  submitted specimen  and confirmed on repeat testing.  While 2019 novel coronavirus  (SARS-CoV-2) nucleic acids may be present in the submitted sample  additional confirmatory testing may be necessary for epidemiological  and / or clinical management purposes  to differentiate between  SARS-CoV-2 and other Sarbecovirus currently known to infect humans.  If clinically indicated additional testing with an alternate test  methodology [phone]) is advised. The SARS-CoV-2 RNA is generally  detectable in upper and lower respiratory sp ecimens during the acute  phase of infection. The expected result is Negative. Fact Sheet for Patients:  StrictlyIdeas.no Fact Sheet for Healthcare Providers: BankingDealers.co.za This test is not yet approved or cleared by the [place] FDA and has been authorized for detection and/or diagnosis of SARS-CoV-2 by FDA under an Emergency Use Authorization (EUA).  This EUA will remain in effect (meaning this test can be used) for the duration of the COVID-19 declaration under Section 564(b)(1) of the Act, 21 U.S.C. section 360bbb-3(b)(1), unless the authorization is terminated or revoked sooner. Performed at [facility], [place] [address] [place], [place] [address]      Time coordinating discharge: 35 minutes      SIGNED:   Edwin Dada, MD  [facility] 11/24/2018, 12:46 PM

## 2018-11-24 NOTE — TOC Transition Note (Signed)
Transition of Care [facility]) - CM/SW Discharge Note   Patient Details  Name: Lawrence Warner MRN: [id number] Date of Birth: Jul 09, 1918  Transition of [facility]) CM/SW Contact:  Bethena Roys, RN Phone Number: 11/24/2018, 11:59 AM   Clinical Narrative:   CM never heard back from the [place] did ask son and patient if ok to use the Medicare Benefit. Family is agreeable- order sent to [facility] patient wants the [employer] tank. Oxygen to be delivered to the room shortly and son will transport patient back to [facility]. CSW with Alvis Lemmings to assist with getting in touch with the [place] CSW to discuss the homemaker aide program and the long term care via [place] Benefit. Patient and son is aware of the transition of care plan. No further needs from CM at this time.   Final next level of care: [place] Barriers to Discharge: No Barriers Identified   Patient Goals and CMS Choice Patient states their goals for this hospitalization and ongoing recovery are:: "to return home" CMS Medicare.gov Compare Post Acute Care list provided to:: Patient Choice offered to / list presented to : Patient  Discharge Plan and Services In-house Referral: NA Discharge Planning Services: CM Consult Post Acute Care Choice: Resumption of Svcs/PTA Provider, Home Health          DME Arranged: Oxygen DME Agency: [facility] Date DME Agency Contacted: 11/24/18 Time DME Agency Contacted: [phone] Representative spoke with at DME Agency: Caryl Pina from [place]: RN, Disease Management, PT, OT [place] Agency: [place] Date [place]: 11/24/18 Time [place]: 44 Representative spoke with at [place]: Cory(Not a candidate for [facility] First.)  Social Determinants of Health ([place]) Interventions     Readmission Risk Interventions Readmission Risk Prevention Plan 11/24/2018  Transportation Screening Complete  Home Care Screening Complete  Medication  Review (RN CM) Complete  Some recent data might be hidden

## 2018-11-24 NOTE — Consult Note (Signed)
   [facility] CM Inpatient Consult   11/24/2018  Musab [place] [birth year] [id number]    Patient evaluated for [place] Management services.  Patient is not currently a beneficiary of the attributed [place] in the [employer].  The patient's Primary Care Provider, Nanci Pina, is not a [facility] primary care provider or is not [facility] affiliated.   This patient is Not eligible for [facility] Management Services.    Reason:  Not a beneficiary currently attributed to one of the [place].   Patient's primary care provider is not an in network provider at this time. Patient also sees MD at the [facility]. Membership roster was used to verify non-eligible status has not seen [facility] MD since 2018.   For questions, please call:   Natividad Brood, RN BSN [facility] Liaison  [phone] business mobile phone Toll free office [phone]  Fax number: [phone] [email or web address]

## 2018-11-25 DIAGNOSIS — E119 Type 2 diabetes mellitus without complications: Secondary | ICD-10-CM | POA: Diagnosis not present

## 2018-11-25 DIAGNOSIS — E1122 Type 2 diabetes mellitus with diabetic chronic kidney disease: Secondary | ICD-10-CM | POA: Diagnosis not present

## 2018-11-25 DIAGNOSIS — R634 Abnormal weight loss: Secondary | ICD-10-CM | POA: Diagnosis not present

## 2018-11-25 DIAGNOSIS — I5033 Acute on chronic diastolic (congestive) heart failure: Secondary | ICD-10-CM | POA: Diagnosis not present

## 2018-11-25 DIAGNOSIS — N183 Chronic kidney disease, stage 3 (moderate): Secondary | ICD-10-CM | POA: Diagnosis not present

## 2018-11-25 DIAGNOSIS — J449 Chronic obstructive pulmonary disease, unspecified: Secondary | ICD-10-CM | POA: Diagnosis not present

## 2018-11-25 DIAGNOSIS — J9601 Acute respiratory failure with hypoxia: Secondary | ICD-10-CM | POA: Diagnosis not present

## 2018-11-25 DIAGNOSIS — I130 Hypertensive heart and chronic kidney disease with heart failure and stage 1 through stage 4 chronic kidney disease, or unspecified chronic kidney disease: Secondary | ICD-10-CM | POA: Diagnosis not present

## 2018-11-25 DIAGNOSIS — I5022 Chronic systolic (congestive) heart failure: Secondary | ICD-10-CM | POA: Diagnosis not present

## 2018-11-25 DIAGNOSIS — I10 Essential (primary) hypertension: Secondary | ICD-10-CM | POA: Diagnosis not present

## 2018-11-26 DIAGNOSIS — E1122 Type 2 diabetes mellitus with diabetic chronic kidney disease: Secondary | ICD-10-CM | POA: Diagnosis not present

## 2018-11-26 DIAGNOSIS — R634 Abnormal weight loss: Secondary | ICD-10-CM | POA: Diagnosis not present

## 2018-11-26 DIAGNOSIS — N183 Chronic kidney disease, stage 3 (moderate): Secondary | ICD-10-CM | POA: Diagnosis not present

## 2018-11-26 DIAGNOSIS — J9601 Acute respiratory failure with hypoxia: Secondary | ICD-10-CM | POA: Diagnosis not present

## 2018-11-26 DIAGNOSIS — I5033 Acute on chronic diastolic (congestive) heart failure: Secondary | ICD-10-CM | POA: Diagnosis not present

## 2018-11-26 DIAGNOSIS — I130 Hypertensive heart and chronic kidney disease with heart failure and stage 1 through stage 4 chronic kidney disease, or unspecified chronic kidney disease: Secondary | ICD-10-CM | POA: Diagnosis not present

## 2018-11-30 DIAGNOSIS — I130 Hypertensive heart and chronic kidney disease with heart failure and stage 1 through stage 4 chronic kidney disease, or unspecified chronic kidney disease: Secondary | ICD-10-CM | POA: Diagnosis not present

## 2018-11-30 DIAGNOSIS — N183 Chronic kidney disease, stage 3 (moderate): Secondary | ICD-10-CM | POA: Diagnosis not present

## 2018-11-30 DIAGNOSIS — J9601 Acute respiratory failure with hypoxia: Secondary | ICD-10-CM | POA: Diagnosis not present

## 2018-11-30 DIAGNOSIS — E1122 Type 2 diabetes mellitus with diabetic chronic kidney disease: Secondary | ICD-10-CM | POA: Diagnosis not present

## 2018-11-30 DIAGNOSIS — I5033 Acute on chronic diastolic (congestive) heart failure: Secondary | ICD-10-CM | POA: Diagnosis not present

## 2018-11-30 DIAGNOSIS — R634 Abnormal weight loss: Secondary | ICD-10-CM | POA: Diagnosis not present

## 2018-12-02 DIAGNOSIS — N183 Chronic kidney disease, stage 3 (moderate): Secondary | ICD-10-CM | POA: Diagnosis not present

## 2018-12-02 DIAGNOSIS — E1122 Type 2 diabetes mellitus with diabetic chronic kidney disease: Secondary | ICD-10-CM | POA: Diagnosis not present

## 2018-12-02 DIAGNOSIS — I130 Hypertensive heart and chronic kidney disease with heart failure and stage 1 through stage 4 chronic kidney disease, or unspecified chronic kidney disease: Secondary | ICD-10-CM | POA: Diagnosis not present

## 2018-12-02 DIAGNOSIS — J9601 Acute respiratory failure with hypoxia: Secondary | ICD-10-CM | POA: Diagnosis not present

## 2018-12-02 DIAGNOSIS — R634 Abnormal weight loss: Secondary | ICD-10-CM | POA: Diagnosis not present

## 2018-12-02 DIAGNOSIS — I5033 Acute on chronic diastolic (congestive) heart failure: Secondary | ICD-10-CM | POA: Diagnosis not present

## 2018-12-04 DIAGNOSIS — E1122 Type 2 diabetes mellitus with diabetic chronic kidney disease: Secondary | ICD-10-CM | POA: Diagnosis not present

## 2018-12-04 DIAGNOSIS — N183 Chronic kidney disease, stage 3 (moderate): Secondary | ICD-10-CM | POA: Diagnosis not present

## 2018-12-04 DIAGNOSIS — R634 Abnormal weight loss: Secondary | ICD-10-CM | POA: Diagnosis not present

## 2018-12-04 DIAGNOSIS — J9601 Acute respiratory failure with hypoxia: Secondary | ICD-10-CM | POA: Diagnosis not present

## 2018-12-04 DIAGNOSIS — I5033 Acute on chronic diastolic (congestive) heart failure: Secondary | ICD-10-CM | POA: Diagnosis not present

## 2018-12-04 DIAGNOSIS — I130 Hypertensive heart and chronic kidney disease with heart failure and stage 1 through stage 4 chronic kidney disease, or unspecified chronic kidney disease: Secondary | ICD-10-CM | POA: Diagnosis not present

## 2018-12-07 DIAGNOSIS — E1122 Type 2 diabetes mellitus with diabetic chronic kidney disease: Secondary | ICD-10-CM | POA: Diagnosis not present

## 2018-12-07 DIAGNOSIS — I5033 Acute on chronic diastolic (congestive) heart failure: Secondary | ICD-10-CM | POA: Diagnosis not present

## 2018-12-07 DIAGNOSIS — I130 Hypertensive heart and chronic kidney disease with heart failure and stage 1 through stage 4 chronic kidney disease, or unspecified chronic kidney disease: Secondary | ICD-10-CM | POA: Diagnosis not present

## 2018-12-07 DIAGNOSIS — R634 Abnormal weight loss: Secondary | ICD-10-CM | POA: Diagnosis not present

## 2018-12-07 DIAGNOSIS — N183 Chronic kidney disease, stage 3 (moderate): Secondary | ICD-10-CM | POA: Diagnosis not present

## 2018-12-07 DIAGNOSIS — J9601 Acute respiratory failure with hypoxia: Secondary | ICD-10-CM | POA: Diagnosis not present

## 2018-12-08 ENCOUNTER — Other Ambulatory Visit: Payer: Self-pay

## 2018-12-08 ENCOUNTER — Inpatient Hospital Stay: Payer: Medicare Other | Attending: Hematology

## 2018-12-08 ENCOUNTER — Inpatient Hospital Stay: Payer: Medicare Other

## 2018-12-08 VITALS — BP 128/72 | HR 62 | Temp 98.2°F | Resp 18

## 2018-12-08 DIAGNOSIS — J9601 Acute respiratory failure with hypoxia: Secondary | ICD-10-CM | POA: Diagnosis not present

## 2018-12-08 DIAGNOSIS — I5033 Acute on chronic diastolic (congestive) heart failure: Secondary | ICD-10-CM | POA: Diagnosis not present

## 2018-12-08 DIAGNOSIS — D693 Immune thrombocytopenic purpura: Secondary | ICD-10-CM

## 2018-12-08 DIAGNOSIS — E1122 Type 2 diabetes mellitus with diabetic chronic kidney disease: Secondary | ICD-10-CM | POA: Diagnosis not present

## 2018-12-08 DIAGNOSIS — N183 Chronic kidney disease, stage 3 (moderate): Secondary | ICD-10-CM | POA: Diagnosis not present

## 2018-12-08 DIAGNOSIS — Z95828 Presence of other vascular implants and grafts: Secondary | ICD-10-CM

## 2018-12-08 DIAGNOSIS — R634 Abnormal weight loss: Secondary | ICD-10-CM | POA: Diagnosis not present

## 2018-12-08 DIAGNOSIS — I130 Hypertensive heart and chronic kidney disease with heart failure and stage 1 through stage 4 chronic kidney disease, or unspecified chronic kidney disease: Secondary | ICD-10-CM | POA: Diagnosis not present

## 2018-12-08 DIAGNOSIS — E538 Deficiency of other specified B group vitamins: Secondary | ICD-10-CM | POA: Diagnosis not present

## 2018-12-08 LAB — CBC WITH DIFFERENTIAL/PLATELET
Abs Immature Granulocytes: 0.05 10*3/uL (ref 0.00–0.07)
Basophils Absolute: 0 10*3/uL (ref 0.0–0.1)
Basophils Relative: 0 %
Eosinophils Absolute: 0.1 10*3/uL (ref 0.0–0.5)
Eosinophils Relative: 2 %
HCT: 36.8 % — ABNORMAL LOW (ref 39.0–52.0)
Hemoglobin: 11.3 g/dL — ABNORMAL LOW (ref 13.0–17.0)
Immature Granulocytes: 1 %
Lymphocytes Relative: 16 %
Lymphs Abs: 0.8 10*3/uL (ref 0.7–4.0)
MCH: 30.2 pg (ref 26.0–34.0)
MCHC: 30.7 g/dL (ref 30.0–36.0)
MCV: 98.4 fL (ref 80.0–100.0)
Monocytes Absolute: 0.4 10*3/uL (ref 0.1–1.0)
Monocytes Relative: 8 %
Neutro Abs: 3.5 10*3/uL (ref 1.7–7.7)
Neutrophils Relative %: 73 %
Platelets: 121 10*3/uL — ABNORMAL LOW (ref 150–400)
RBC: 3.74 MIL/uL — ABNORMAL LOW (ref 4.22–5.81)
RDW: 11.9 % (ref 11.5–15.5)
WBC: 4.8 10*3/uL (ref 4.0–10.5)
nRBC: 0 % (ref 0.0–0.2)

## 2018-12-08 MED ORDER — CYANOCOBALAMIN 1000 MCG/ML IJ SOLN
1000.0000 ug | INTRAMUSCULAR | Status: DC
  Administered 2018-12-08: 1000 ug via SUBCUTANEOUS

## 2018-12-08 MED ORDER — ROMIPLOSTIM 250 MCG ~~LOC~~ SOLR: 0.5000 ug/kg | SUBCUTANEOUS | Status: DC

## 2018-12-09 DIAGNOSIS — R634 Abnormal weight loss: Secondary | ICD-10-CM | POA: Diagnosis not present

## 2018-12-09 DIAGNOSIS — N183 Chronic kidney disease, stage 3 (moderate): Secondary | ICD-10-CM | POA: Diagnosis not present

## 2018-12-09 DIAGNOSIS — I5033 Acute on chronic diastolic (congestive) heart failure: Secondary | ICD-10-CM | POA: Diagnosis not present

## 2018-12-09 DIAGNOSIS — J9601 Acute respiratory failure with hypoxia: Secondary | ICD-10-CM | POA: Diagnosis not present

## 2018-12-09 DIAGNOSIS — I130 Hypertensive heart and chronic kidney disease with heart failure and stage 1 through stage 4 chronic kidney disease, or unspecified chronic kidney disease: Secondary | ICD-10-CM | POA: Diagnosis not present

## 2018-12-09 DIAGNOSIS — E1122 Type 2 diabetes mellitus with diabetic chronic kidney disease: Secondary | ICD-10-CM | POA: Diagnosis not present

## 2018-12-14 DIAGNOSIS — J9601 Acute respiratory failure with hypoxia: Secondary | ICD-10-CM | POA: Diagnosis not present

## 2018-12-14 DIAGNOSIS — I130 Hypertensive heart and chronic kidney disease with heart failure and stage 1 through stage 4 chronic kidney disease, or unspecified chronic kidney disease: Secondary | ICD-10-CM | POA: Diagnosis not present

## 2018-12-14 DIAGNOSIS — N183 Chronic kidney disease, stage 3 (moderate): Secondary | ICD-10-CM | POA: Diagnosis not present

## 2018-12-14 DIAGNOSIS — R634 Abnormal weight loss: Secondary | ICD-10-CM | POA: Diagnosis not present

## 2018-12-14 DIAGNOSIS — I5033 Acute on chronic diastolic (congestive) heart failure: Secondary | ICD-10-CM | POA: Diagnosis not present

## 2018-12-14 DIAGNOSIS — E1122 Type 2 diabetes mellitus with diabetic chronic kidney disease: Secondary | ICD-10-CM | POA: Diagnosis not present

## 2018-12-15 DIAGNOSIS — I5033 Acute on chronic diastolic (congestive) heart failure: Secondary | ICD-10-CM | POA: Diagnosis not present

## 2018-12-15 DIAGNOSIS — E1122 Type 2 diabetes mellitus with diabetic chronic kidney disease: Secondary | ICD-10-CM | POA: Diagnosis not present

## 2018-12-15 DIAGNOSIS — R634 Abnormal weight loss: Secondary | ICD-10-CM | POA: Diagnosis not present

## 2018-12-15 DIAGNOSIS — N183 Chronic kidney disease, stage 3 (moderate): Secondary | ICD-10-CM | POA: Diagnosis not present

## 2018-12-15 DIAGNOSIS — J9601 Acute respiratory failure with hypoxia: Secondary | ICD-10-CM | POA: Diagnosis not present

## 2018-12-15 DIAGNOSIS — I130 Hypertensive heart and chronic kidney disease with heart failure and stage 1 through stage 4 chronic kidney disease, or unspecified chronic kidney disease: Secondary | ICD-10-CM | POA: Diagnosis not present

## 2018-12-16 DIAGNOSIS — E1122 Type 2 diabetes mellitus with diabetic chronic kidney disease: Secondary | ICD-10-CM | POA: Diagnosis not present

## 2018-12-16 DIAGNOSIS — R634 Abnormal weight loss: Secondary | ICD-10-CM | POA: Diagnosis not present

## 2018-12-16 DIAGNOSIS — I130 Hypertensive heart and chronic kidney disease with heart failure and stage 1 through stage 4 chronic kidney disease, or unspecified chronic kidney disease: Secondary | ICD-10-CM | POA: Diagnosis not present

## 2018-12-16 DIAGNOSIS — J9601 Acute respiratory failure with hypoxia: Secondary | ICD-10-CM | POA: Diagnosis not present

## 2018-12-16 DIAGNOSIS — I5033 Acute on chronic diastolic (congestive) heart failure: Secondary | ICD-10-CM | POA: Diagnosis not present

## 2018-12-16 DIAGNOSIS — N183 Chronic kidney disease, stage 3 (moderate): Secondary | ICD-10-CM | POA: Diagnosis not present

## 2018-12-19 DIAGNOSIS — Z9981 Dependence on supplemental oxygen: Secondary | ICD-10-CM | POA: Diagnosis not present

## 2018-12-19 DIAGNOSIS — E1140 Type 2 diabetes mellitus with diabetic neuropathy, unspecified: Secondary | ICD-10-CM | POA: Diagnosis not present

## 2018-12-19 DIAGNOSIS — Z96641 Presence of right artificial hip joint: Secondary | ICD-10-CM | POA: Diagnosis not present

## 2018-12-19 DIAGNOSIS — E1122 Type 2 diabetes mellitus with diabetic chronic kidney disease: Secondary | ICD-10-CM | POA: Diagnosis not present

## 2018-12-19 DIAGNOSIS — R634 Abnormal weight loss: Secondary | ICD-10-CM | POA: Diagnosis not present

## 2018-12-19 DIAGNOSIS — I5084 End stage heart failure: Secondary | ICD-10-CM | POA: Diagnosis not present

## 2018-12-19 DIAGNOSIS — I130 Hypertensive heart and chronic kidney disease with heart failure and stage 1 through stage 4 chronic kidney disease, or unspecified chronic kidney disease: Secondary | ICD-10-CM | POA: Diagnosis not present

## 2018-12-19 DIAGNOSIS — I2510 Atherosclerotic heart disease of native coronary artery without angina pectoris: Secondary | ICD-10-CM | POA: Diagnosis not present

## 2018-12-19 DIAGNOSIS — J9811 Atelectasis: Secondary | ICD-10-CM | POA: Diagnosis not present

## 2018-12-19 DIAGNOSIS — H9190 Unspecified hearing loss, unspecified ear: Secondary | ICD-10-CM | POA: Diagnosis not present

## 2018-12-19 DIAGNOSIS — Z96659 Presence of unspecified artificial knee joint: Secondary | ICD-10-CM | POA: Diagnosis not present

## 2018-12-19 DIAGNOSIS — M459 Ankylosing spondylitis of unspecified sites in spine: Secondary | ICD-10-CM | POA: Diagnosis not present

## 2018-12-19 DIAGNOSIS — I5033 Acute on chronic diastolic (congestive) heart failure: Secondary | ICD-10-CM | POA: Diagnosis not present

## 2018-12-19 DIAGNOSIS — Z6829 Body mass index (BMI) 29.0-29.9, adult: Secondary | ICD-10-CM | POA: Diagnosis not present

## 2018-12-19 DIAGNOSIS — M1990 Unspecified osteoarthritis, unspecified site: Secondary | ICD-10-CM | POA: Diagnosis not present

## 2018-12-19 DIAGNOSIS — J9601 Acute respiratory failure with hypoxia: Secondary | ICD-10-CM | POA: Diagnosis not present

## 2018-12-19 DIAGNOSIS — E440 Moderate protein-calorie malnutrition: Secondary | ICD-10-CM | POA: Diagnosis not present

## 2018-12-19 DIAGNOSIS — E875 Hyperkalemia: Secondary | ICD-10-CM | POA: Diagnosis not present

## 2018-12-19 DIAGNOSIS — N184 Chronic kidney disease, stage 4 (severe): Secondary | ICD-10-CM | POA: Diagnosis not present

## 2018-12-19 DIAGNOSIS — Z951 Presence of aortocoronary bypass graft: Secondary | ICD-10-CM | POA: Diagnosis not present

## 2018-12-19 DIAGNOSIS — N400 Enlarged prostate without lower urinary tract symptoms: Secondary | ICD-10-CM | POA: Diagnosis not present

## 2018-12-19 DIAGNOSIS — Z8701 Personal history of pneumonia (recurrent): Secondary | ICD-10-CM | POA: Diagnosis not present

## 2018-12-19 DIAGNOSIS — D693 Immune thrombocytopenic purpura: Secondary | ICD-10-CM | POA: Diagnosis not present

## 2018-12-19 DIAGNOSIS — I447 Left bundle-branch block, unspecified: Secondary | ICD-10-CM | POA: Diagnosis not present

## 2018-12-19 DIAGNOSIS — E785 Hyperlipidemia, unspecified: Secondary | ICD-10-CM | POA: Diagnosis not present

## 2018-12-21 ENCOUNTER — Telehealth: Payer: Self-pay | Admitting: *Deleted

## 2018-12-21 DIAGNOSIS — I5084 End stage heart failure: Secondary | ICD-10-CM | POA: Diagnosis not present

## 2018-12-21 DIAGNOSIS — E1122 Type 2 diabetes mellitus with diabetic chronic kidney disease: Secondary | ICD-10-CM | POA: Diagnosis not present

## 2018-12-21 DIAGNOSIS — N184 Chronic kidney disease, stage 4 (severe): Secondary | ICD-10-CM | POA: Diagnosis not present

## 2018-12-21 DIAGNOSIS — I130 Hypertensive heart and chronic kidney disease with heart failure and stage 1 through stage 4 chronic kidney disease, or unspecified chronic kidney disease: Secondary | ICD-10-CM | POA: Diagnosis not present

## 2018-12-21 DIAGNOSIS — J9601 Acute respiratory failure with hypoxia: Secondary | ICD-10-CM | POA: Diagnosis not present

## 2018-12-21 DIAGNOSIS — I5033 Acute on chronic diastolic (congestive) heart failure: Secondary | ICD-10-CM | POA: Diagnosis not present

## 2018-12-21 NOTE — Telephone Encounter (Signed)
Daughter-in-law Lawrence Warner (on [place]) called.Patient told family he had been dismissed by Dr. Irene Limbo and was not to come back. Patient received call regarding appt for lab/injection 10/27 at [facility] and DIL wants to verify appt. Informed her that patient is still under Dr.Kale's care, has lab/inj appt tomorrow and appt with Dr. Irene Limbo that includes Lab/inj appt on 11/10.   She states patient's health is declining due to heart problems and he is increasingly forgetful. She will verify if patient wants to attend appt on 10/27 and let office know and asked to have either her or pts son come to MD appt on 11/10.  Advised her that [facility] supervisors will be contacted to allow family member to come to appt. on 11/10.

## 2018-12-22 ENCOUNTER — Inpatient Hospital Stay: Payer: Medicare Other

## 2018-12-22 ENCOUNTER — Telehealth: Payer: Self-pay | Admitting: *Deleted

## 2018-12-22 ENCOUNTER — Telehealth: Payer: Self-pay | Admitting: Emergency Medicine

## 2018-12-22 NOTE — Telephone Encounter (Signed)
Attempted to contact [name] (DIL) to inform that one family member may come to Dr.Kale's appt on 11/10. They will follow [place] screening protocol and need to wear a mask. LVVM with this information. Appt note edited to state a support person is allowed

## 2018-12-22 NOTE — Telephone Encounter (Signed)
Spoke with RN Sandi about pt's appts for today.  See telephone note from 12/21/2018.  Cancelling lab/injection appt for today, RN Sandi aware.

## 2018-12-28 ENCOUNTER — Inpatient Hospital Stay (HOSPITAL_COMMUNITY)
Admission: EM | Admit: 2018-12-28 | Discharge: 2019-01-06 | DRG: 291 | Disposition: A | Discharge status: Skilled Nursing Facility | Payer: Medicare Other | Attending: Internal Medicine | Admitting: Internal Medicine

## 2018-12-28 ENCOUNTER — Emergency Department (HOSPITAL_COMMUNITY): Payer: Medicare Other

## 2018-12-28 DIAGNOSIS — J9601 Acute respiratory failure with hypoxia: Secondary | ICD-10-CM | POA: Diagnosis present

## 2018-12-28 DIAGNOSIS — N189 Chronic kidney disease, unspecified: Secondary | ICD-10-CM | POA: Diagnosis not present

## 2018-12-28 DIAGNOSIS — N400 Enlarged prostate without lower urinary tract symptoms: Secondary | ICD-10-CM | POA: Diagnosis present

## 2018-12-28 DIAGNOSIS — I10 Essential (primary) hypertension: Secondary | ICD-10-CM | POA: Diagnosis not present

## 2018-12-28 DIAGNOSIS — I5023 Acute on chronic systolic (congestive) heart failure: Secondary | ICD-10-CM | POA: Diagnosis not present

## 2018-12-28 DIAGNOSIS — Z809 Family history of malignant neoplasm, unspecified: Secondary | ICD-10-CM

## 2018-12-28 DIAGNOSIS — Z66 Do not resuscitate: Secondary | ICD-10-CM | POA: Diagnosis present

## 2018-12-28 DIAGNOSIS — E119 Type 2 diabetes mellitus without complications: Secondary | ICD-10-CM | POA: Diagnosis not present

## 2018-12-28 DIAGNOSIS — M1990 Unspecified osteoarthritis, unspecified site: Secondary | ICD-10-CM | POA: Diagnosis present

## 2018-12-28 DIAGNOSIS — K219 Gastro-esophageal reflux disease without esophagitis: Secondary | ICD-10-CM | POA: Diagnosis present

## 2018-12-28 DIAGNOSIS — N184 Chronic kidney disease, stage 4 (severe): Secondary | ICD-10-CM | POA: Diagnosis present

## 2018-12-28 DIAGNOSIS — I959 Hypotension, unspecified: Secondary | ICD-10-CM | POA: Diagnosis not present

## 2018-12-28 DIAGNOSIS — J80 Acute respiratory distress syndrome: Secondary | ICD-10-CM | POA: Diagnosis not present

## 2018-12-28 DIAGNOSIS — E1140 Type 2 diabetes mellitus with diabetic neuropathy, unspecified: Secondary | ICD-10-CM | POA: Diagnosis present

## 2018-12-28 DIAGNOSIS — I130 Hypertensive heart and chronic kidney disease with heart failure and stage 1 through stage 4 chronic kidney disease, or unspecified chronic kidney disease: Principal | ICD-10-CM | POA: Diagnosis present

## 2018-12-28 DIAGNOSIS — N179 Acute kidney failure, unspecified: Secondary | ICD-10-CM | POA: Diagnosis not present

## 2018-12-28 DIAGNOSIS — I2510 Atherosclerotic heart disease of native coronary artery without angina pectoris: Secondary | ICD-10-CM | POA: Diagnosis present

## 2018-12-28 DIAGNOSIS — Z9981 Dependence on supplemental oxygen: Secondary | ICD-10-CM

## 2018-12-28 DIAGNOSIS — N183 Chronic kidney disease, stage 3 unspecified: Secondary | ICD-10-CM | POA: Diagnosis not present

## 2018-12-28 DIAGNOSIS — R55 Syncope and collapse: Secondary | ICD-10-CM | POA: Diagnosis not present

## 2018-12-28 DIAGNOSIS — Z7401 Bed confinement status: Secondary | ICD-10-CM | POA: Diagnosis not present

## 2018-12-28 DIAGNOSIS — Z20828 Contact with and (suspected) exposure to other viral communicable diseases: Secondary | ICD-10-CM | POA: Diagnosis present

## 2018-12-28 DIAGNOSIS — J9621 Acute and chronic respiratory failure with hypoxia: Secondary | ICD-10-CM | POA: Diagnosis present

## 2018-12-28 DIAGNOSIS — Z951 Presence of aortocoronary bypass graft: Secondary | ICD-10-CM

## 2018-12-28 DIAGNOSIS — I110 Hypertensive heart disease with heart failure: Secondary | ICD-10-CM | POA: Diagnosis not present

## 2018-12-28 DIAGNOSIS — E1122 Type 2 diabetes mellitus with diabetic chronic kidney disease: Secondary | ICD-10-CM | POA: Diagnosis present

## 2018-12-28 DIAGNOSIS — R9431 Abnormal electrocardiogram [ECG] [EKG]: Secondary | ICD-10-CM | POA: Diagnosis not present

## 2018-12-28 DIAGNOSIS — E785 Hyperlipidemia, unspecified: Secondary | ICD-10-CM | POA: Diagnosis present

## 2018-12-28 DIAGNOSIS — Z888 Allergy status to other drugs, medicaments and biological substances status: Secondary | ICD-10-CM

## 2018-12-28 DIAGNOSIS — I248 Other forms of acute ischemic heart disease: Secondary | ICD-10-CM | POA: Diagnosis present

## 2018-12-28 DIAGNOSIS — Z836 Family history of other diseases of the respiratory system: Secondary | ICD-10-CM

## 2018-12-28 DIAGNOSIS — I509 Heart failure, unspecified: Secondary | ICD-10-CM

## 2018-12-28 DIAGNOSIS — I5033 Acute on chronic diastolic (congestive) heart failure: Secondary | ICD-10-CM | POA: Diagnosis present

## 2018-12-28 DIAGNOSIS — Z96641 Presence of right artificial hip joint: Secondary | ICD-10-CM | POA: Diagnosis present

## 2018-12-28 DIAGNOSIS — Z880 Allergy status to penicillin: Secondary | ICD-10-CM

## 2018-12-28 DIAGNOSIS — R069 Unspecified abnormalities of breathing: Secondary | ICD-10-CM | POA: Diagnosis not present

## 2018-12-28 DIAGNOSIS — D693 Immune thrombocytopenic purpura: Secondary | ICD-10-CM | POA: Diagnosis present

## 2018-12-28 DIAGNOSIS — M2550 Pain in unspecified joint: Secondary | ICD-10-CM | POA: Diagnosis not present

## 2018-12-28 DIAGNOSIS — I499 Cardiac arrhythmia, unspecified: Secondary | ICD-10-CM | POA: Diagnosis not present

## 2018-12-28 DIAGNOSIS — R0902 Hypoxemia: Secondary | ICD-10-CM | POA: Diagnosis not present

## 2018-12-28 DIAGNOSIS — Z87891 Personal history of nicotine dependence: Secondary | ICD-10-CM

## 2018-12-28 DIAGNOSIS — Z91048 Other nonmedicinal substance allergy status: Secondary | ICD-10-CM

## 2018-12-28 DIAGNOSIS — I4510 Unspecified right bundle-branch block: Secondary | ICD-10-CM | POA: Diagnosis present

## 2018-12-28 DIAGNOSIS — I447 Left bundle-branch block, unspecified: Secondary | ICD-10-CM | POA: Diagnosis present

## 2018-12-28 DIAGNOSIS — R0602 Shortness of breath: Secondary | ICD-10-CM | POA: Diagnosis not present

## 2018-12-28 LAB — CBC WITH DIFFERENTIAL/PLATELET
Abs Immature Granulocytes: 0.13 10*3/uL — ABNORMAL HIGH (ref 0.00–0.07)
Basophils Absolute: 0 10*3/uL (ref 0.0–0.1)
Basophils Relative: 0 %
Eosinophils Absolute: 0.1 10*3/uL (ref 0.0–0.5)
Eosinophils Relative: 2 %
HCT: 35.7 % — ABNORMAL LOW (ref 39.0–52.0)
Hemoglobin: 10.8 g/dL — ABNORMAL LOW (ref 13.0–17.0)
Immature Granulocytes: 2 %
Lymphocytes Relative: 11 %
Lymphs Abs: 0.7 10*3/uL (ref 0.7–4.0)
MCH: 29.8 pg (ref 26.0–34.0)
MCHC: 30.3 g/dL (ref 30.0–36.0)
MCV: 98.3 fL (ref 80.0–100.0)
Monocytes Absolute: 0.5 10*3/uL (ref 0.1–1.0)
Monocytes Relative: 8 %
Neutro Abs: 5 10*3/uL (ref 1.7–7.7)
Neutrophils Relative %: 77 %
Platelets: 207 10*3/uL (ref 150–400)
RBC: 3.63 MIL/uL — ABNORMAL LOW (ref 4.22–5.81)
RDW: 11.9 % (ref 11.5–15.5)
WBC: 6.5 10*3/uL (ref 4.0–10.5)
nRBC: 0 % (ref 0.0–0.2)

## 2018-12-28 LAB — COMPREHENSIVE METABOLIC PANEL
ALT: 12 U/L (ref 0–44)
AST: 21 U/L (ref 15–41)
Albumin: 2.2 g/dL — ABNORMAL LOW (ref 3.5–5.0)
Alkaline Phosphatase: 54 U/L (ref 38–126)
Anion gap: 8 (ref 5–15)
BUN: 32 mg/dL — ABNORMAL HIGH (ref 8–23)
CO2: 25 mmol/L (ref 22–32)
Calcium: 8.2 mg/dL — ABNORMAL LOW (ref 8.9–10.3)
Chloride: 106 mmol/L (ref 98–111)
Creatinine, Ser: 1.5 mg/dL — ABNORMAL HIGH (ref 0.61–1.24)
GFR calc Af Amer: 44 mL/min — ABNORMAL LOW (ref >60)
GFR calc non Af Amer: 38 mL/min — ABNORMAL LOW (ref >60)
Glucose, Bld: 121 mg/dL — ABNORMAL HIGH (ref 70–99)
Potassium: 5 mmol/L (ref 3.5–5.1)
Sodium: 139 mmol/L (ref 135–145)
Total Bilirubin: 0.7 mg/dL (ref 0.3–1.2)
Total Protein: 5.9 g/dL — ABNORMAL LOW (ref 6.5–8.1)

## 2018-12-28 LAB — TROPONIN I (HIGH SENSITIVITY): Troponin I (High Sensitivity): 23 ng/L — ABNORMAL HIGH (ref <18)

## 2018-12-28 LAB — LACTIC ACID, PLASMA: Lactic Acid, Venous: 1.2 mmol/L (ref 0.5–1.9)

## 2018-12-28 LAB — BRAIN NATRIURETIC PEPTIDE: B Natriuretic Peptide: 393.2 pg/mL — ABNORMAL HIGH (ref 0.0–100.0)

## 2018-12-28 MED ORDER — FUROSEMIDE 10 MG/ML IJ SOLN
20.0000 mg | Freq: Two times a day (BID) | INTRAMUSCULAR | Status: DC
  Administered 2018-12-29 – 2018-12-31 (×5): 20 mg via INTRAVENOUS
  Filled 2018-12-28 (×5): qty 2

## 2018-12-28 MED ORDER — INSULIN ASPART 100 UNIT/ML ~~LOC~~ SOLN
0.0000 [IU] | Freq: Three times a day (TID) | SUBCUTANEOUS | Status: DC
  Administered 2018-12-30 – 2018-12-31 (×2): 1 [IU] via SUBCUTANEOUS
  Administered 2018-12-31: 2 [IU] via SUBCUTANEOUS
  Administered 2019-01-01 – 2019-01-06 (×7): 1 [IU] via SUBCUTANEOUS

## 2018-12-28 MED ORDER — HEPARIN SODIUM (PORCINE) 5000 UNIT/ML IJ SOLN
5000.0000 [IU] | Freq: Three times a day (TID) | INTRAMUSCULAR | Status: DC
  Administered 2018-12-28 – 2019-01-06 (×27): 5000 [IU] via SUBCUTANEOUS
  Filled 2018-12-28 (×27): qty 1

## 2018-12-28 MED ORDER — ACETAMINOPHEN 325 MG PO TABS
650.0000 mg | ORAL_TABLET | Freq: Four times a day (QID) | ORAL | Status: DC | PRN
  Administered 2018-12-29: 650 mg via ORAL
  Filled 2018-12-28 (×2): qty 2

## 2018-12-28 MED ORDER — FUROSEMIDE 10 MG/ML IJ SOLN: 20.0000 mg | Freq: Two times a day (BID) | INTRAMUSCULAR | Status: DC

## 2018-12-28 MED ORDER — FUROSEMIDE 10 MG/ML IJ SOLN
20.0000 mg | Freq: Once | INTRAMUSCULAR | Status: AC
  Administered 2018-12-28: 20 mg via INTRAVENOUS
  Filled 2018-12-28: qty 2

## 2018-12-28 NOTE — ED Triage Notes (Addendum)
Pt BIB EMS with c/o SHOB x 2 days. Pt has hx of CHF and COPD and is normally has SHOB. However, pt seemed more SHOB than usual. Per EMS pt had no syncopal episode. Pt denies N/V/D, chills, recent illness, and urinary symptoms.

## 2018-12-28 NOTE — ED Provider Notes (Signed)
[facility] EMERGENCY DEPARTMENT Provider Note   CSN: [id number] Arrival date & time: 12/28/18  1820     History   Chief Complaint Chief Complaint  Patient presents with  . Shortness of Breath    HPI Lawrence Warner is a 85 y.o. male.  HPI   Patient presents today for shortness of breath that has been present for last few days.  He states he has been short of breath for the last 7 years, but it has been worse over the last few days, not sure on the exact onset date.  Associated symptoms include orthopnea, increased oxygen requirement (from 2-1/2 L home oxygen to 4 L currently).  He lives in an independent living facility and is normally very independent, but he has been dyspneic even getting up to use the restroom.  He apparently has helped with his medications at his facility.  He denies any missed doses of his medications.  Nothing seems to make his symptoms better.  Exertion makes his symptoms worse.  Past Medical History:  Diagnosis Date  . BPH (benign prostatic hypertrophy)   . Chronic heart failure ([place])   . Chronic renal insufficiency   . Coronary artery disease    CABG in 2000  . Diabetes type 2, controlled ([place])    Diet-controlled  . Diabetic neuropathy ([place])   . Dyslipidemia   . Hypertension   . ITP (idiopathic thrombocytopenic purpura)   . Osteoarthritis   . Pneumonia     Patient Active Problem List   Diagnosis Date Noted  . Acute on chronic combined systolic and diastolic heart failure ([place]) 11/23/2018  . Acute exacerbation of CHF (congestive heart failure) ([place]) 11/22/2018  . CAD (coronary artery disease) 11/22/2018  . Type 2 diabetes mellitus ([place]) 11/22/2018  . QT prolongation 11/22/2018  . CHF (congestive heart failure) ([place]) 11/12/2018  . Coronary artery disease involving native coronary artery of native heart without angina pectoris 01/08/2018  . Generalized weakness 05/17/2017  . Hypertensive emergency 05/17/2017  . RSV  bronchitis   . Bronchospasm with bronchitis, acute 03/14/2017  . Acute on chronic diastolic CHF (congestive heart failure) ([place]) 11/26/2016  . CKD (chronic kidney disease) stage 3, GFR 30-59 ml/min 11/26/2016  . LBBB (left bundle branch block) 11/26/2016  . Hypoxia   . Acute respiratory failure with hypoxia ([place]) 07/20/2016  . Chronic respiratory failure with hypoxia ([place]) 02/28/2016  . Dyslipidemia 02/28/2016  . Constipation 12/07/2015  . Bradycardia 11/28/2015  . Port catheter in place 10/24/2015  . GERD (gastroesophageal reflux disease) 10/09/2015  . Gait instability 10/09/2015  . Chest pain 10/05/2015  . Diet-controlled diabetes mellitus ([place]) 05/07/2015  . Hx of CABG 05/07/2015  . Essential hypertension 05/07/2015  . Muscle cramps 04/25/2015  . Shortness of breath 04/11/2015  . Idiopathic thrombocytopenic purpura ([place]) 09/27/2014    Past Surgical History:  Procedure Laterality Date  . CHOLECYSTECTOMY    . CORONARY ARTERY BYPASS GRAFT  2000  . Right hip replacement  2014  . TONSILLECTOMY          Home Medications    Prior to Admission medications   Medication Sig Start Date End Date Taking? Authorizing Provider  OXYGEN Inhale 2.5 L into the lungs continuous.   Yes [provider]  acetaminophen (TYLENOL) 500 MG tablet Take 500-1,000 mg by mouth every 6 (six) hours as needed for mild pain.     [provider]  brimonidine (ALPHAGAN) 0.2 % ophthalmic solution Place 1 drop into both eyes  3 (three) times daily.    [provider]  cycloSPORINE (RESTASIS) 0.05 % ophthalmic emulsion Place 1 drop into both eyes every 12 (twelve) hours.     [provider]  Dextran 70-Hypromellose (ARTIFICIAL TEARS PF OP) Place 1 drop into both eyes 4 (four) times daily.     [provider]  furosemide (LASIX) 20 MG tablet Take 1 tablet (20 mg total) by mouth daily. 11/17/18   Eugenie Filler, MD  magnesium oxide (MAG-OX) 400 MG tablet Take 400 mg  daily by mouth.    [provider]  Multiple Vitamin (MULTIVITAMIN WITH MINERALS) TABS tablet Take 1 tablet by mouth daily.    [provider]  simvastatin (ZOCOR) 20 MG tablet Take 20 mg by mouth at bedtime.     [provider]    Family History Family History  Problem Relation Age of Onset  . Lung disease Father   . Cancer Brother     Social History Social History   Tobacco Use  . Smoking status: Former Smoker    Packs/day: 2.00    Years: 25.00    Pack years: 50.00  . Smokeless tobacco: Never Used  Substance Use Topics  . Alcohol use: No  . Drug use: No     Allergies   Alfuzosin hcl er, Imdur [isosorbide dinitrate], Penicillins, and Tape   Review of Systems Review of Systems  Constitutional: Negative for fever.  Respiratory: Positive for shortness of breath. Negative for cough and wheezing.   Cardiovascular: Negative for chest pain and palpitations.  Gastrointestinal: Negative for abdominal pain, nausea and vomiting.  Skin: Negative for rash and wound.  Neurological: Negative for syncope.  All other systems reviewed and are negative.    Physical Exam Updated Vital Signs BP (!) 145/65   Pulse (!) 116   Temp 97.6 F (36.4 C) (Oral)   Resp (!) 38   SpO2 91%   Physical Exam Vitals signs and nursing note reviewed.  Constitutional:      Appearance: He is well-developed.  HENT:     Head: Normocephalic and atraumatic.  Eyes:     Conjunctiva/sclera: Conjunctivae normal.  Neck:     Musculoskeletal: Neck supple.  Cardiovascular:     Rate and Rhythm: Normal rate and regular rhythm.  Pulmonary:     Effort: Tachypnea and accessory muscle usage present.     Breath sounds: Rales present.     Comments: Dyspneic, on 4 L nasal cannula oxygen, tachypneic Abdominal:     Palpations: Abdomen is soft.     Tenderness: There is no abdominal tenderness.  Musculoskeletal:     Comments: No bilateral lower extremity pitting edema  Skin:     General: Skin is warm and dry.  Neurological:     Mental Status: He is alert and oriented to person, place, and time.     Motor: No weakness.      ED Treatments / Results  Labs (all labs ordered are listed, but only abnormal results are displayed) Labs Reviewed  CBC WITH DIFFERENTIAL/PLATELET - Abnormal; Notable for the following components:      Result Value   RBC 3.63 (*)    Hemoglobin 10.8 (*)    HCT 35.7 (*)    Abs Immature Granulocytes 0.13 (*)    All other components within normal limits  COMPREHENSIVE METABOLIC PANEL - Abnormal; Notable for the following components:   Glucose, Bld 121 (*)    BUN 32 (*)    Creatinine, Ser 1.50 (*)  Calcium 8.2 (*)    Total Protein 5.9 (*)    Albumin 2.2 (*)    GFR calc non Af Amer 38 (*)    GFR calc Af Amer 44 (*)    All other components within normal limits  BRAIN NATRIURETIC PEPTIDE - Abnormal; Notable for the following components:   B Natriuretic Peptide 393.2 (*)    All other components within normal limits  TROPONIN I (HIGH SENSITIVITY) - Abnormal; Notable for the following components:   Troponin I (High Sensitivity) 23 (*)    All other components within normal limits  NOVEL CORONAVIRUS, NAA (HOSP ORDER, SEND-OUT TO REF LAB; TAT 18-24 HRS)  LACTIC ACID, PLASMA  BASIC METABOLIC PANEL  MAGNESIUM  TROPONIN I (HIGH SENSITIVITY)    EKG None  Radiology Dg Chest Portable 1 View  Result Date: 12/28/2018 CLINICAL DATA:  Shortness of breath for 1 day EXAM: PORTABLE CHEST 1 VIEW COMPARISON:  11/22/2018 FINDINGS: Bilateral interstitial thickening with patchy alveolar airspace opacities. Trace left pleural effusion. No right pleural effusion. No pneumothorax. Stable cardiomegaly. Prior CABG. No acute osseous abnormality. IMPRESSION: Stable cardiomegaly with mild interstitial edema. Electronically Signed   By: Kathreen Devoid   On: 12/28/2018 19:33    Procedures Procedures (including critical care time)  Medications Ordered in ED  Medications  furosemide (LASIX) injection 20 mg (has no administration in time range)  furosemide (LASIX) injection 20 mg (has no administration in time range)  heparin injection 5,000 Units (has no administration in time range)  acetaminophen (TYLENOL) tablet 650 mg (has no administration in time range)  insulin aspart (novoLOG) injection 0-9 Units (has no administration in time range)     Initial Impression / Assessment and Plan / ED Course  I have reviewed the triage vital signs and the nursing notes.  Pertinent labs & imaging results that were available during my care of the patient were reviewed by me and considered in my medical decision making (see chart for details).     HEAR Score: 5  Jaelynn Currier is a 85 y.o. male with past medical history of CHF with preserved ejection fraction, CAD status post CABG, CKD stage III-IV, diabetes type 2, hypertension, hyperlipidemia presents today for shortness of breath.  He was recently admitted in September for acute on chronic CHF exacerbation.  Presentation is most consistent with similar exacerbation today.  Labs and imaging ordered to evaluate for pleural effusions, electrolyte abnormality, sepsis, volume overload from CHF.  Labs resulted and showed a creatinine of 1.5, baseline around 1.8.  No significant anemia, leukocytosis, troponinemia.  Suspect mildly elevated troponin of 23 is from demand.  EKG does not show any new ischemic changes beyond his left bundle branch block that is similar to previous tracings.  Chest x-ray showed cardiomegaly with mild interstitial edema.  IV Lasix 20 mg ordered.  Plan to admit to medicine for further evaluation and management.  Care of patient discussed with the supervising attending.  Final Clinical Impressions(s) / ED Diagnoses   Final diagnoses:  Acute on chronic diastolic congestive heart failure ([place])  Chronic kidney disease, unspecified CKD stage    ED Discharge Orders    None        Julianne Rice, MD 12/28/18 2230    Varney Biles, MD 12/29/18 7408

## 2018-12-28 NOTE — H&P (Addendum)
History and Physical    Lawrence Warner PPI:[id number] DOB: 03/11/1918 DOA: 12/28/2018  PCP: Clovia Cuff, MD Patient coming from: Home  Chief Complaint: Shortness of breath  HPI: Lawrence Warner is a 85 y.o. male with medical history significant of CAD status post CABG in 0630, diastolic congestive heart failure, hypertension, hyperlipidemia, diet-controlled type II diabetes, CKD stage III-IV, BPH presenting with complaints of dyspnea.  Patient states he is always short of and is not sure when his breathing became worse.  Reports orthopnea.  Denies chest pain.  States he lives at an independent living facility and manages his own medications.  He is not sure if he takes Lasix.  Denies increased dietary sodium or water intake.  No additional history could be obtained from him.  ED Course: Afebrile and no leukocytosis.  Hemoglobin 10.8, not significantly changed compared to labs done 2 weeks ago.  BUN 32, creatinine 1.5.  Baseline creatinine 1.6-2.0.  BNP 393.  High-sensitivity troponin 23.  EKG without acute ischemic changes.  Chest x-ray showing stable cardiomegaly with mild interstitial edema. Patient received IV Lasix 20 mg.  Review of Systems:  All systems reviewed and apart from history of presenting illness, are negative.  Past Medical History:  Diagnosis Date  . BPH (benign prostatic hypertrophy)   . Chronic heart failure ([place])   . Chronic renal insufficiency   . Coronary artery disease    CABG in 2000  . Diabetes type 2, controlled ([place])    Diet-controlled  . Diabetic neuropathy ([place])   . Dyslipidemia   . Hypertension   . ITP (idiopathic thrombocytopenic purpura)   . Osteoarthritis   . Pneumonia     Past Surgical History:  Procedure Laterality Date  . CHOLECYSTECTOMY    . CORONARY ARTERY BYPASS GRAFT  2000  . Right hip replacement  2014  . TONSILLECTOMY       reports that he has quit smoking. He has a 50.00 pack-year smoking history. He has never used smokeless  tobacco. He reports that he does not drink alcohol or use drugs.  Allergies  Allergen Reactions  . Alfuzosin Hcl Er Other (See Comments)    Confusion/Uroxatral   . Imdur [Isosorbide Dinitrate] Other (See Comments)    Unknown reaction  . Penicillins Hives and Other (See Comments)    Has patient had a PCN reaction causing immediate rash, facial/tongue/throat swelling, SOB or lightheadedness with hypotension: Yes Has patient had a PCN reaction causing severe rash involving mucus membranes or skin necrosis: Unknown Has patient had a PCN reaction that required hospitalization: Unknown Has patient had a PCN reaction occurring within the last 10 years: Unknown If all of the above answers are "NO", then may proceed with Cephalosporin use.   . Tape Other (See Comments)    SKIN IS VERY THIN AND WILL TEAR EASILY    Family History  Problem Relation Age of Onset  . Lung disease Father   . Cancer Brother     Prior to Admission medications   Medication Sig Start Date End Date Taking? Authorizing Provider  acetaminophen (TYLENOL) 500 MG tablet Take 500-1,000 mg by mouth every 6 (six) hours as needed for mild pain.     [provider]  brimonidine (ALPHAGAN) 0.2 % ophthalmic solution Place 1 drop into both eyes 3 (three) times daily.    [provider]  cycloSPORINE (RESTASIS) 0.05 % ophthalmic emulsion Place 1 drop into both eyes every 12 (twelve) hours.     [provider]  Dextran  70-Hypromellose (ARTIFICIAL TEARS PF OP) Place 1 drop into both eyes 4 (four) times daily.     [provider]  furosemide (LASIX) 20 MG tablet Take 1 tablet (20 mg total) by mouth daily. 11/17/18   Eugenie Filler, MD  magnesium oxide (MAG-OX) 400 MG tablet Take 400 mg daily by mouth.    [provider]  Multiple Vitamin (MULTIVITAMIN WITH MINERALS) TABS tablet Take 1 tablet by mouth daily.    [provider]  simvastatin (ZOCOR) 20 MG tablet Take 20 mg by mouth  at bedtime.     [provider]    Physical Exam: Vitals:   12/28/18 1826 12/28/18 1935 12/28/18 2206  BP: (!) 158/52 (!) 157/66 (!) 145/65  Pulse: (!) 48 (!) 47 (!) 116  Resp: (!) 22 (!) 33 (!) 38  Temp: 97.6 F (36.4 C)    TempSrc: Oral    SpO2: 99% 97% 91%    Physical Exam  Constitutional: He is oriented to person, place, and time.  HENT:  Head: Normocephalic.  Eyes: Right eye exhibits no discharge. Left eye exhibits no discharge.  Neck: Neck supple. JVD present.  Cardiovascular: Normal rate, regular rhythm and intact distal pulses.  Pulmonary/Chest: He has no wheezes. He has no rales.  Tachypneic, increased work of breathing  Abdominal: Soft. Bowel sounds are normal. He exhibits no distension. There is no abdominal tenderness. There is no guarding.  Musculoskeletal:        General: Edema present.     Comments: +1 edema at the level of the ankles bilaterally  Neurological: He is alert and oriented to person, place, and time.  Skin: Skin is warm and dry. He is not diaphoretic.     Labs on Admission: I have personally reviewed following labs and imaging studies  CBC: Recent Labs  Lab 12/28/18 1940  WBC 6.5  NEUTROABS 5.0  HGB 10.8*  HCT 35.7*  MCV 98.3  PLT 485   Basic Metabolic Panel: Recent Labs  Lab 12/28/18 1940  NA 139  K 5.0  CL 106  CO2 25  GLUCOSE 121*  BUN 32*  CREATININE 1.50*  CALCIUM 8.2*   GFR: CrCl cannot be calculated (Unknown ideal weight.). Liver Function Tests: Recent Labs  Lab 12/28/18 1940  AST 21  ALT 12  ALKPHOS 54  BILITOT 0.7  PROT 5.9*  ALBUMIN 2.2*   No results for input(s): LIPASE, AMYLASE in the last 168 hours. No results for input(s): AMMONIA in the last 168 hours. Coagulation Profile: No results for input(s): INR, PROTIME in the last 168 hours. Cardiac Enzymes: No results for input(s): CKTOTAL, CKMB, CKMBINDEX, TROPONINI in the last 168 hours. BNP (last 3 results) No results for input(s): PROBNP in  the last 8760 hours. HbA1C: No results for input(s): HGBA1C in the last 72 hours. CBG: No results for input(s): GLUCAP in the last 168 hours. Lipid Profile: No results for input(s): CHOL, HDL, LDLCALC, TRIG, CHOLHDL, LDLDIRECT in the last 72 hours. Thyroid Function Tests: No results for input(s): TSH, T4TOTAL, FREET4, T3FREE, THYROIDAB in the last 72 hours. Anemia Panel: No results for input(s): VITAMINB12, FOLATE, FERRITIN, TIBC, IRON, RETICCTPCT in the last 72 hours. Urine analysis:    Component Value Date/Time   COLORURINE YELLOW 11/11/2018 1747   APPEARANCEUR CLEAR 11/11/2018 1747   LABSPEC 1.018 11/11/2018 1747   PHURINE 6.0 11/11/2018 1747   GLUCOSEU NEGATIVE 11/11/2018 1747   HGBUR NEGATIVE 11/11/2018 1747   BILIRUBINUR NEGATIVE 11/11/2018 1747   KETONESUR NEGATIVE  11/11/2018 1747   PROTEINUR 100 (A) 11/11/2018 1747   NITRITE NEGATIVE 11/11/2018 1747   LEUKOCYTESUR NEGATIVE 11/11/2018 1747    Radiological Exams on Admission: Dg Chest Portable 1 View  Result Date: 12/28/2018 CLINICAL DATA:  Shortness of breath for 1 day EXAM: PORTABLE CHEST 1 VIEW COMPARISON:  11/22/2018 FINDINGS: Bilateral interstitial thickening with patchy alveolar airspace opacities. Trace left pleural effusion. No right pleural effusion. No pneumothorax. Stable cardiomegaly. Prior CABG. No acute osseous abnormality. IMPRESSION: Stable cardiomegaly with mild interstitial edema. Electronically Signed   By: Kathreen Devoid   On: 12/28/2018 19:33    EKG: Independently reviewed.  Sinus rhythm, LBBB similar to prior tracing.  QTc 521.  Assessment/Plan Principal Problem:   Acute exacerbation of CHF (congestive heart failure) (HCC) Active Problems:   Diet-controlled diabetes mellitus ([facility])   Acute respiratory failure with hypoxia ([facility])   CKD (chronic kidney disease) stage 3, GFR 30-59 ml/min   QT prolongation   Acute exacerbation of chronic diastolic congestive heart failure Patient presenting with  complaints of dyspnea and orthopnea.  BNP elevated at 393.  Chest x-ray showing stable cardiomegaly with mild interstitial edema. Last echo done 11/12/2018 showing normal LVEF of 50 to 55% and left ventricular diastolic parameters consistent with impaired relaxation.  Suspect acute CHF exacerbation is possibly related to Lasix nonadherence.  Patient states he manages his medications on his own. -Cardiac monitoring -Received IV Lasix 20 mg daily.  Continue IV Lasix 20 mg twice daily starting in the morning. -Monitor intake and output, daily weights, low-sodium diet with fluid restriction -Continue to monitor renal function  Addendum: Patient has not yet received the one-time dose of IV Lasix 20 mg that was ordered in the ED.  Spoke to nursing staff and requested them to administer Lasix as soon as possible.  Then, IV Lasix 20 mg twice daily starting in the morning as scheduled.  Acute on chronic hypoxic respiratory failure Suspect related to acute exacerbation of chronic diastolic congestive heart failure. Patient uses 2.5 L supplemental oxygen at home and is currently requiring 4 L. -Management of CHF exacerbation as above -Continuous pulse ox -Continue supplemental oxygen to keep oxygen saturation above 92%  CKD stage III-IV -Stable.  Creatinine 1.5 at present.  Baseline creatinine 1.6-2.0.  Continue to monitor renal function with IV diuresis.  Mild elevation of troponin secondary to demand ischemia High-sensitivity troponin 23.  EKG without acute ischemic changes.  Patient denies chest pain. -Cardiac monitoring -Second set of troponin pending  QT prolongation on EKG -Cardiac monitoring -Keep potassium above 4 and magnesium above 2 -Avoid QT prolonging drugs if possible -Repeat EKG in a.m.  Diet controlled type 2 diabetes -Sliding scale insulin sensitive and CBG checks.  Pharmacy med rec pending.  DVT prophylaxis: Subcutaneous heparin Code Status: DNR.  Signed form at bedside.  Family Communication: No family available. Disposition Plan: Anticipate discharge after clinical improvement. Consults called: None Admission status: It is my clinical opinion that admission to INPATIENT is reasonable and necessary in this 85 y.o. male presenting with acute exacerbation of chronic diastolic congestive heart failure.  Currently on 4 L supplemental oxygen, increased compared to his home oxygen requirement.  Will need IV Lasix for several days.  Given the aforementioned, the predictability of an adverse outcome is felt to be significant. I expect that the patient will require at least 2 midnights in the hospital to treat this condition.   The medical decision making on this patient was of high complexity and the patient  is at high risk for clinical deterioration, therefore this is a level 3 visit.  Shela Leff MD [facility] Pager [phone]  If 7PM-7AM, please contact night-coverage [email or web address] Password [email or web address]  12/28/2018, 10:20 PM

## 2018-12-29 ENCOUNTER — Encounter (HOSPITAL_COMMUNITY): Payer: Self-pay

## 2018-12-29 ENCOUNTER — Other Ambulatory Visit: Payer: Self-pay

## 2018-12-29 DIAGNOSIS — N189 Chronic kidney disease, unspecified: Secondary | ICD-10-CM

## 2018-12-29 LAB — BASIC METABOLIC PANEL
Anion gap: 12 (ref 5–15)
BUN: 28 mg/dL — ABNORMAL HIGH (ref 8–23)
CO2: 24 mmol/L (ref 22–32)
Calcium: 8.5 mg/dL — ABNORMAL LOW (ref 8.9–10.3)
Chloride: 101 mmol/L (ref 98–111)
Creatinine, Ser: 1.53 mg/dL — ABNORMAL HIGH (ref 0.61–1.24)
GFR calc Af Amer: 43 mL/min — ABNORMAL LOW (ref >60)
GFR calc non Af Amer: 37 mL/min — ABNORMAL LOW (ref >60)
Glucose, Bld: 142 mg/dL — ABNORMAL HIGH (ref 70–99)
Potassium: 4.5 mmol/L (ref 3.5–5.1)
Sodium: 137 mmol/L (ref 135–145)

## 2018-12-29 LAB — GLUCOSE, CAPILLARY
Glucose-Capillary: 109 mg/dL — ABNORMAL HIGH (ref 70–99)
Glucose-Capillary: 104 mg/dL — ABNORMAL HIGH (ref 70–99)
Glucose-Capillary: 114 mg/dL — ABNORMAL HIGH (ref 70–99)
Glucose-Capillary: 115 mg/dL — ABNORMAL HIGH (ref 70–99)
Glucose-Capillary: 132 mg/dL — ABNORMAL HIGH (ref 70–99)

## 2018-12-29 LAB — MAGNESIUM: Magnesium: 2.1 mg/dL (ref 1.7–2.4)

## 2018-12-29 LAB — TROPONIN I (HIGH SENSITIVITY): Troponin I (High Sensitivity): 27 ng/L — ABNORMAL HIGH (ref <18)

## 2018-12-29 NOTE — Progress Notes (Signed)
PROGRESS NOTE    Lawrence Warner  PZW:[id number] DOB: 09/11/1918 DOA: 12/28/2018 PCP: Clovia Cuff, MD    Brief Narrative:  85 year old Caucasian male with extensive past medical history who presented with dyspnea of unknown duration.  Patient was unsure whether he was taking his Lasix or not.  In the emergency department patient had an elevated BNP as well as a chest x-ray showing stable cardiomegaly with mild interstitial edema.  Assessment & Plan:   Acute exacerbation of chronic diastolic congestive heart failure: Last echo done 11/12/2018 showing normal LVEF of 50 to 55% and left ventricular diastolic parameters consistent with impaired relaxation.  Suspect acute CHF exacerbation is possibly related to Lasix nonadherence.  Patient states he manages his medications on his own. -Cardiac monitoring -Continue IV Lasix 20 mg twice daily -Monitor intake and output, daily weights, low-sodium diet with fluid restriction -Continue to monitor renal function -Case management consult  Acute on chronic hypoxic respiratory failure: Suspect related to acute exacerbation of chronic diastolic congestive heart failure. Patient uses 2.5 L supplemental oxygen at home and is currently requiring 4 L. -Management of CHF exacerbation as above -Continuous pulse ox -Continue supplemental oxygen to keep oxygen saturation above 92%  CKD stage III-IV -Stable.  Creatinine 1.5 at present.  Baseline creatinine 1.6-2.0.  Continue to monitor renal function with IV diuresis.  Mild elevation of troponin secondary to demand ischemia: High-sensitivity troponin 23-->27.  EKG without acute ischemic changes.  Patient denies chest pain. -Cardiac monitoring  QT prolongation on EKG -Cardiac monitoring -Keep potassium above 4 and magnesium above 2 -Avoid QT prolonging drugs if possible  Diet controlled type 2 diabetes -Sliding scale insulin sensitive and CBG checks.   DVT prophylaxis: Heparin SQ Code  Status:DNR   Consultants:   n/a  Procedures:   n/a  Antimicrobials:  n/a    Subjective: States he feels about the same.  He thinks his shortness of breath is very slightly improved and denies any swelling.  Denies any shortness of breath nausea vomiting or change in appetite.  Objective: Vitals:   12/29/18 0000 12/29/18 0405 12/29/18 0854 12/29/18 0855  BP:  (!) 131/49  (!) 147/44  Pulse:  (!) 110  63  Resp:  20  16  Temp:  98.2 F (36.8 C) 98 F (36.7 C) 98 F (36.7 C)  TempSrc:    Oral  SpO2: 92% 93%  98%  Weight:  85.3 kg    Height:        Intake/Output Summary (Last 24 hours) at 12/29/2018 1112 Last data filed at 12/29/2018 0854 Gross per 24 hour  Intake 480 ml  Output 1000 ml  Net -520 ml   Filed Weights   12/28/18 2254 12/29/18 0405  Weight: 85.5 kg 85.3 kg    Examination:  General exam: Appears calm and comfortable  Respiratory system: Slightly diminished breath sounds throughout. Respiratory effort normal. Cardiovascular system: S1 & S2 heard, RRR. No JVD, murmurs, rubs, gallops or clicks. No pedal edema. Gastrointestinal system: Abdomen is nondistended, soft and nontender. No organomegaly or masses felt. Normal bowel sounds heard. Central nervous system: Alert and oriented. No focal neurological deficits. Extremities: Symmetric 5 x 5 power. Skin: No rashes, lesions or ulcers    Scheduled Meds: . furosemide  20 mg Intravenous BID  . heparin injection (subcutaneous)  5,000 Units Subcutaneous Q8H  . insulin aspart  0-9 Units Subcutaneous TID WC   Continuous Infusions:   LOS: 1 day    Time spent: 35 mins  Charolotte Capuchin, MD [facility] Pager [phone]  If 7PM-7AM, please contact night-coverage [email or web address] Password [email or web address] 12/29/2018, 11:12 AM

## 2018-12-30 LAB — BASIC METABOLIC PANEL
Anion gap: 11 (ref 5–15)
BUN: 33 mg/dL — ABNORMAL HIGH (ref 8–23)
CO2: 25 mmol/L (ref 22–32)
Calcium: 8.6 mg/dL — ABNORMAL LOW (ref 8.9–10.3)
Chloride: 102 mmol/L (ref 98–111)
Creatinine, Ser: 1.57 mg/dL — ABNORMAL HIGH (ref 0.61–1.24)
GFR calc Af Amer: 42 mL/min — ABNORMAL LOW (ref >60)
GFR calc non Af Amer: 36 mL/min — ABNORMAL LOW (ref >60)
Glucose, Bld: 106 mg/dL — ABNORMAL HIGH (ref 70–99)
Potassium: 4.5 mmol/L (ref 3.5–5.1)
Sodium: 138 mmol/L (ref 135–145)

## 2018-12-30 LAB — GLUCOSE, CAPILLARY
Glucose-Capillary: 111 mg/dL — ABNORMAL HIGH (ref 70–99)
Glucose-Capillary: 130 mg/dL — ABNORMAL HIGH (ref 70–99)
Glucose-Capillary: 114 mg/dL — ABNORMAL HIGH (ref 70–99)
Glucose-Capillary: 137 mg/dL — ABNORMAL HIGH (ref 70–99)

## 2018-12-30 LAB — NOVEL CORONAVIRUS, NAA (HOSP ORDER, SEND-OUT TO REF LAB; TAT 18-24 HRS): SARS-CoV-2, NAA: NOT DETECTED

## 2018-12-30 MED ORDER — MAGNESIUM OXIDE 400 (241.3 MG) MG PO TABS
400.0000 mg | ORAL_TABLET | Freq: Every day | ORAL | Status: DC
  Administered 2018-12-30 – 2019-01-06 (×8): 400 mg via ORAL
  Filled 2018-12-30 (×8): qty 1

## 2018-12-30 MED ORDER — CYCLOSPORINE 0.05 % OP EMUL
1.0000 [drp] | Freq: Two times a day (BID) | OPHTHALMIC | Status: DC
  Administered 2018-12-30 – 2019-01-06 (×14): 1 [drp] via OPHTHALMIC
  Filled 2018-12-30 (×16): qty 30

## 2018-12-30 MED ORDER — SIMVASTATIN 20 MG PO TABS
20.0000 mg | ORAL_TABLET | Freq: Every day | ORAL | Status: DC
  Administered 2018-12-30 – 2019-01-05 (×7): 20 mg via ORAL
  Filled 2018-12-30 (×7): qty 1

## 2018-12-30 MED ORDER — LOSARTAN POTASSIUM 50 MG PO TABS
100.0000 mg | ORAL_TABLET | Freq: Every day | ORAL | Status: DC
  Administered 2018-12-30 – 2019-01-06 (×8): 100 mg via ORAL
  Filled 2018-12-30 (×8): qty 2

## 2018-12-30 MED ORDER — BRIMONIDINE TARTRATE 0.2 % OP SOLN
1.0000 [drp] | Freq: Three times a day (TID) | OPHTHALMIC | Status: DC
  Administered 2018-12-30 – 2019-01-06 (×22): 1 [drp] via OPHTHALMIC
  Filled 2018-12-30: qty 5

## 2018-12-30 NOTE — Progress Notes (Signed)
Son [name] at bedside, asking for update from MD, paged Dr Evelina Bucy with number

## 2018-12-30 NOTE — Progress Notes (Signed)
PROGRESS NOTE    Lawrence Warner  WEX:[id number] DOB: [birth year] DOA: 12/28/2018 PCP: Clovia Cuff, MD    Brief Narrative:  85 year old Caucasian male with extensive past medical history who presented with dyspnea of unknown duration.  Patient was unsure whether he was taking his Lasix or not.  In the emergency department patient had an elevated BNP as well as a chest x-ray showing stable cardiomegaly with mild interstitial edema.  Assessment & Plan:   Acute exacerbation of chronic diastolic congestive heart failure: Last echo done 11/12/2018 showing normal LVEF of 50 to 55% and left ventricular diastolic parameters consistent with impaired relaxation.  Suspect acute CHF exacerbation is possibly related to Lasix nonadherence.  Patient states he manages his medications on his own. -Cardiac monitoring -Continue IV Lasix 20 mg twice daily -Monitor intake and output, daily weights, low-sodium diet with fluid restriction -Continue to monitor renal function -Case management consult/PT/OT  Acute on chronic hypoxic respiratory failure: Suspect related to acute exacerbation of chronic diastolic congestive heart failure. Patient uses 2.5 L supplemental oxygen at home and is currently requiring 4 L. -Management of CHF exacerbation as above -Continuous pulse ox -Continue supplemental oxygen to keep oxygen saturation above 92%  CKD stage III-IV -Stable.  Creatinine 1.5 at present.  Baseline creatinine 1.6-2.0.  Continue to monitor renal function with IV diuresis.  Mild elevation of troponin secondary to demand ischemia: High-sensitivity troponin 23-->27.  EKG without acute ischemic changes.  Patient denies chest pain. -Cardiac monitoring  QT prolongation on EKG -Cardiac monitoring -Keep potassium above 4 and magnesium above 2 -Avoid QT prolonging drugs if possible  Diet controlled type 2 diabetes -Sliding scale insulin sensitive and CBG checks.   DVT prophylaxis: Heparin SQ Code  Status:DNR   Consultants:   n/a  Procedures:   n/a  Antimicrobials:  n/a    Subjective: No acute complaints today and states his breathing is stable.  He denies feeling acutely short of breath or coughing.  He does endorse weakness.  Objective: Vitals:   12/30/18 0623 12/30/18 0731 12/30/18 0740 12/30/18 1132  BP: (!) 151/83  (!) 165/72 113/64  Pulse: 63   68  Resp: (!) 24  18 20   Temp:   (!) 97.4 F (36.3 C) 98.7 F (37.1 C)  TempSrc:   Oral Oral  SpO2:  94% 100% 91%  Weight:      Height:        Intake/Output Summary (Last 24 hours) at 12/30/2018 1443 Last data filed at 12/30/2018 1214 Gross per 24 hour  Intake 720 ml  Output 1400 ml  Net -680 ml   Filed Weights   12/28/18 2254 12/29/18 0405 12/30/18 0500  Weight: 85.5 kg 85.3 kg 81.7 kg    Examination:  General exam: Appears calm and comfortable  Respiratory system: Minimal diffuse rales throughout. Respiratory effort normal. Cardiovascular system: S1 & S2 heard, RRR. No JVD, murmurs, rubs, gallops or clicks. No pedal edema. Gastrointestinal system: Abdomen is nondistended, soft and nontender. No organomegaly or masses felt. Normal bowel sounds heard. Central nervous system: Alert and oriented. No focal neurological deficits. Extremities: Symmetric 5 x 5 power. Skin: No rashes, lesions or ulcers    Scheduled Meds: . brimonidine  1 drop Both Eyes TID  . cycloSPORINE  1 drop Both Eyes Q12H  . furosemide  20 mg Intravenous BID  . heparin injection (subcutaneous)  5,000 Units Subcutaneous Q8H  . insulin aspart  0-9 Units Subcutaneous TID WC  . losartan  100 mg Oral Daily  .  magnesium oxide  400 mg Oral Daily  . simvastatin  20 mg Oral QHS   Continuous Infusions:   LOS: 2 days    Time spent: 25 mins   Charolotte Capuchin, MD [facility] Pager [phone]  If 7PM-7AM, please contact night-coverage [email or web address] Password [facility] 12/30/2018, 2:43 PM

## 2018-12-30 NOTE — Progress Notes (Signed)
SATURATION QUALIFICATIONS: (This note is used to comply with regulatory documentation for home oxygen)  Patient Saturations on Room Air at Rest 87    Patient Saturations on Room Air while Ambulating =84 %  Patient Saturations on 4Liters of oxygen while Ambulating = 91%  Please briefly explain why patient needs home oxygen: pt ambulated to bathroom, very DOE and several minutes to recover and 02 up to 91% and rest

## 2018-12-31 LAB — BASIC METABOLIC PANEL
Anion gap: 10 (ref 5–15)
BUN: 46 mg/dL — ABNORMAL HIGH (ref 8–23)
CO2: 26 mmol/L (ref 22–32)
Calcium: 8.6 mg/dL — ABNORMAL LOW (ref 8.9–10.3)
Chloride: 103 mmol/L (ref 98–111)
Creatinine, Ser: 2.02 mg/dL — ABNORMAL HIGH (ref 0.61–1.24)
GFR calc Af Amer: 31 mL/min — ABNORMAL LOW (ref >60)
GFR calc non Af Amer: 27 mL/min — ABNORMAL LOW (ref >60)
Glucose, Bld: 121 mg/dL — ABNORMAL HIGH (ref 70–99)
Potassium: 4.4 mmol/L (ref 3.5–5.1)
Sodium: 139 mmol/L (ref 135–145)

## 2018-12-31 LAB — GLUCOSE, CAPILLARY
Glucose-Capillary: 116 mg/dL — ABNORMAL HIGH (ref 70–99)
Glucose-Capillary: 172 mg/dL — ABNORMAL HIGH (ref 70–99)
Glucose-Capillary: 121 mg/dL — ABNORMAL HIGH (ref 70–99)
Glucose-Capillary: 158 mg/dL — ABNORMAL HIGH (ref 70–99)

## 2018-12-31 MED ORDER — FUROSEMIDE 20 MG PO TABS
20.0000 mg | ORAL_TABLET | Freq: Every day | ORAL | Status: DC
  Administered 2019-01-01 – 2019-01-06 (×6): 20 mg via ORAL
  Filled 2018-12-31 (×6): qty 1

## 2018-12-31 MED ORDER — SODIUM CHLORIDE 0.9 % IV SOLN
INTRAVENOUS | Status: AC
  Administered 2018-12-31: 11:00:00 via INTRAVENOUS

## 2018-12-31 NOTE — Evaluation (Addendum)
Occupational Therapy Evaluation Patient Details Name: Lawrence Warner MRN: [id number] DOB: 1918/07/15 Today's Date: 12/31/2018    History of Present Illness Pt is a 85 y.o. male admitted 12/28/18 with dyspnea of unknown duration; worked up for CHF excerbation. PMH includes CKD III-IV, DM, CHF, HTN, CAD, OA.   Clinical Impression   This 85 y/o male presents with the above. PTA pt reports mod independent with ADL and functional mobility using SPC for short distances, motor scooter for further distance and to go to meals in facility (living alone at [facility]). Pt presenting with weakness, impaired balance, decreased activity tolerance. Pt currently requiring overall minA (+2 safety utilized today) for functional mobility within room using RW. He currently requires minguard assist for seated UB and grooming ADL, mod-maxA for LB ADL. Pt on 6L O2 during session with SpO2 >90% post activity end of session. Pt will benefit from continued acute OT services, currently recommend follow up SNF level therapy services (pending availability of 24hr supervision/assist at current facility). Will follow.     Follow Up Recommendations  SNF;Supervision/Assistance - 24 hour(vs return home with 24hr assist)    Equipment Recommendations  3 in 1 bedside commode           Precautions / Restrictions Precautions Precautions: Fall Restrictions Weight Bearing Restrictions: No      Mobility Bed Mobility Overal bed mobility: Needs Assistance Bed Mobility: Supine to Sit     Supine to sit: Min assist;HOB elevated     General bed mobility comments: assist for trunk elevation  Transfers Overall transfer level: Needs assistance Equipment used: Rolling walker (2 wheeled) Transfers: Sit to/from Stand Sit to Stand: Mod assist;+2 physical assistance;+2 safety/equipment;Min guard         General transfer comment: pt unable to stand on his own initially from EOB, required boosting assist to rise and  steady at [place], increased time and effort. when standing from chair at sink with bil armrests pt able to stand with minguard assist    Balance Overall balance assessment: Needs assistance Sitting-balance support: Feet supported Sitting balance-Leahy Scale: Fair     Standing balance support: Bilateral upper extremity supported Standing balance-Leahy Scale: Poor Standing balance comment: reliant on UE support                           ADL either performed or assessed with clinical judgement   ADL Overall ADL's : Needs assistance/impaired Eating/Feeding: Modified independent;Sitting   Grooming: Set up;Min guard;Sitting;Oral care;Wash/dry face;Brushing hair Grooming Details (indicate cue type and reason): performed in sitting at sink as pt too fatigued to complete in standing Upper Body Bathing: Min guard;Sitting   Lower Body Bathing: Moderate assistance;Sit to/from stand   Upper Body Dressing : Set up;Min guard;Sitting   Lower Body Dressing: Moderate assistance;Sit to/from stand   Toilet Transfer: Minimal assistance;+2 for safety/equipment;Ambulation;RW Toilet Transfer Details (indicate cue type and reason): simulated via transfer to recliner, amb to sink from EOB then to recliner next to sink Toileting- Water quality scientist and Hygiene: Moderate assistance;Sit to/from stand       Functional mobility during ADLs: Minimal assistance;Moderate assistance;+2 for safety/equipment;Rolling walker General ADL Comments: pt with weakness, decreased standing balance, fatigue                         Pertinent Vitals/Pain Pain Assessment: No/denies pain     Hand Dominance     Extremity/Trunk Assessment Upper Extremity Assessment Upper Extremity  Assessment: Generalized weakness   Lower Extremity Assessment Lower Extremity Assessment: Defer to PT evaluation   Cervical / Trunk Assessment Cervical / Trunk Assessment: Other exceptions Cervical / Trunk Exceptions:  decreased cervical ROM at baseline   Communication Communication Communication: HOH   Cognition Arousal/Alertness: Awake/alert Behavior During Therapy: WFL for tasks assessed/performed Overall Cognitive Status: Within Functional Limits for tasks assessed                                 General Comments: for basic functional tasks   General Comments  pt on 6L O2 during session, pulse ox was wet so not working properly, no s/s of respiratory distress, once donned new pulse ox pt O2 sats >90%    Exercises     Shoulder Instructions      Home Living Family/patient expects to be discharged to:: Assisted living Living Arrangements: Alone                               Additional Comments: From [place]. Wears O2      Prior Functioning/Environment Level of Independence: Independent  Gait / Transfers Assistance Needed: Reports using scooter some in room and to dining room; very short apartment distances with [facility]     Comments: Facility provides meals and cleaning services        OT Problem List: Decreased strength;Decreased activity tolerance;Impaired balance (sitting and/or standing);Cardiopulmonary status limiting activity;Decreased knowledge of use of DME or AE      OT Treatment/Interventions: Self-care/ADL training;Therapeutic exercise;Energy conservation;DME and/or AE instruction;Therapeutic activities;Patient/family education;Balance training    OT Goals(Current goals can be found in the care plan section) Acute Rehab OT Goals Patient Stated Goal: none stated, agreeable to working with therapies and to get OOB OT Goal Formulation: With patient Time For Goal Achievement: 01/14/19 Potential to Achieve Goals: Good  OT Frequency: Min 2X/week   Barriers to D/C:            Co-evaluation PT/OT/SLP Co-Evaluation/Treatment: Yes Reason for Co-Treatment: For patient/therapist safety;To address functional/ADL transfers   OT goals addressed  during session: ADL's and self-care      AM-PAC OT "6 Clicks" Daily Activity     Outcome Measure Help from another person eating meals?: None Help from another person taking care of personal grooming?: A Little Help from another person toileting, which includes using toliet, bedpan, or urinal?: A Lot Help from another person bathing (including washing, rinsing, drying)?: A Lot Help from another person to put on and taking off regular upper body clothing?: None Help from another person to put on and taking off regular lower body clothing?: A Lot 6 Click Score: 17   End of Session Equipment Utilized During Treatment: Gait belt;Rolling walker;Oxygen Nurse Communication: Mobility status  Activity Tolerance: Patient tolerated treatment well Patient left: in chair;with call bell/phone within reach;with chair alarm set;with nursing/sitter in room  OT Visit Diagnosis: Unsteadiness on feet (R26.81);Muscle weakness (generalized) (M62.81)                Time: 6606-3016 OT Time Calculation (min): 28 min Charges:  OT General Charges $OT Visit: 1 Visit OT Evaluation $OT Eval Moderate Complexity: [place], OT [employer] Pager [phone] Office [place] 12/31/2018, 10:38 AM

## 2018-12-31 NOTE — Evaluation (Addendum)
Physical Therapy Evaluation Patient Details Name: Lawrence Warner MRN: [id number] DOB: 02/28/1918 Today's Date: 12/31/2018   History of Present Illness  Pt is a 85 y.o. male admitted 12/28/18 with dyspnea of unknown duration; worked up for CHF excerbation. PMH includes CKD III-IV, DM, CHF, HTN, CAD, OA.    Clinical Impression  Pt presents with an overall decrease in functional mobility secondary to above. PTA, pt resident at [facility]; pt reports he lives alone, uses [facility] and scooter for mobility, indep with ADL tasks. Today, pt requiring modA to stand from low surface, min guard for ambulation with RW; limited by generalized weakness and decreased activity tolerance. VSS on 6L O2 HFNC. Pt would benefit from continued acute PT services to maximize functional mobility and independence prior to d/c with SNF-level therapies (pending availability of 24hr supervision/assist at current facility).     Follow Up Recommendations SNF;Supervision/Assistance - 24 hour(versus return to ALF with [facility] and 24/7)    Equipment Recommendations  None recommended by PT    Recommendations for Other Services       Precautions / Restrictions Precautions Precautions: Fall Precaution Comments: Currently on 6L O2 HFNC Restrictions Weight Bearing Restrictions: No      Mobility  Bed Mobility Overal bed mobility: Needs Assistance Bed Mobility: Supine to Sit     Supine to sit: Min assist;HOB elevated     General bed mobility comments: assist for trunk elevation  Transfers Overall transfer level: Needs assistance Equipment used: Rolling walker (2 wheeled) Transfers: Sit to/from Stand Sit to Stand: Mod assist;Min guard         General transfer comment: Pt requiring modA to stand from low EOB to RW; able to stand from chair with bilateral armrests with min guard when cued for hand placement  Ambulation/Gait Ambulation/Gait assistance: Min guard Gait Distance (Feet): 16 Feet Assistive device:  Rolling walker (2 wheeled) Gait Pattern/deviations: Step-through pattern;Decreased stride length;Trunk flexed Gait velocity: Decreased Gait velocity interpretation: <1.31 ft/sec, indicative of household ambulator General Gait Details: Slow gait with RW and close min guard; prolonged sitting break at sink to perform ADL tasks before additional ambulation  Stairs            Wheelchair Mobility    Modified Rankin (Stroke Patients Only)       Balance Overall balance assessment: Needs assistance Sitting-balance support: Feet supported Sitting balance-Leahy Scale: Fair Sitting balance - Comments: Able to lean forward in chair well to perform ADL tasks at sink; easily fatigued   Standing balance support: Bilateral upper extremity supported Standing balance-Leahy Scale: Poor Standing balance comment: reliant on UE support                             Pertinent Vitals/Pain Pain Assessment: No/denies pain    Home Living Family/patient expects to be discharged to:: Assisted living Living Arrangements: Alone             Home Equipment: Walker - 2 wheels;Cane - single point;Electric scooter;Wheelchair - manual Additional Comments: From [employer]. Wears O2 baseline    Prior Function Level of Independence: Independent   Gait / Transfers Assistance Needed: Reports using scooter some in room and to dining room; very short apartment distances with [facility]     Comments: Pt reports indep walking in room with and without SPC, uses scooter outside of room. Facility provides meals (allowed to eat in dining room with 2 people per table). Pt reports indep with ADLs  Hand Dominance        Extremity/Trunk Assessment   Upper Extremity Assessment Upper Extremity Assessment: Generalized weakness    Lower Extremity Assessment Lower Extremity Assessment: Generalized weakness    Cervical / Trunk Assessment Cervical / Trunk Assessment: Other exceptions Cervical /  Trunk Exceptions: decreased cervical ROM at baseline  Communication   Communication: HOH  Cognition Arousal/Alertness: Awake/alert Behavior During Therapy: WFL for tasks assessed/performed Overall Cognitive Status: Within Functional Limits for tasks assessed                                 General Comments: for basic functional tasks; not formally assessed      General Comments General comments (skin integrity, edema, etc.): pt on 6L O2 during session, pulse ox was wet so not working properly, no s/s of respiratory distress, once donned new pulse ox pt O2 sats >90%    Exercises     Assessment/Plan    PT Assessment Patient needs continued PT services  PT Problem List Decreased strength;Decreased activity tolerance;Decreased balance;Decreased mobility;Decreased knowledge of use of DME;Cardiopulmonary status limiting activity       PT Treatment Interventions DME instruction;Gait training;Functional mobility training;Therapeutic activities;Therapeutic exercise;Balance training;Patient/family education    PT Goals (Current goals can be found in the Care Plan section)  Acute Rehab PT Goals Patient Stated Goal: none stated, agreeable to working with therapies and to get OOB PT Goal Formulation: With patient Time For Goal Achievement: 01/14/19 Potential to Achieve Goals: Good    Frequency Min 3X/week   Barriers to discharge Decreased caregiver support      Co-evaluation   Reason for Co-Treatment: For patient/therapist safety;To address functional/ADL transfers   OT goals addressed during session: ADL's and self-care       AM-PAC PT "6 Clicks" Mobility  Outcome Measure Help needed turning from your back to your side while in a flat bed without using bedrails?: A Little Help needed moving from lying on your back to sitting on the side of a flat bed without using bedrails?: A Little Help needed moving to and from a bed to a chair (including a wheelchair)?: A  Little Help needed standing up from a chair using your arms (e.g., wheelchair or bedside chair)?: A Lot Help needed to walk in hospital room?: A Little Help needed climbing 3-5 steps with a railing? : A Lot 6 Click Score: 16    End of Session Equipment Utilized During Treatment: Gait belt;Oxygen Activity Tolerance: Patient tolerated treatment well Patient left: in chair;with call bell/phone within reach;with chair alarm set;with nursing/sitter in room Nurse Communication: Mobility status PT Visit Diagnosis: Other abnormalities of gait and mobility (R26.89);Muscle weakness (generalized) (M62.81)    Time: 3790-2409 PT Time Calculation (min) (ACUTE ONLY): 26 min   Charges:   PT Evaluation $PT Eval Moderate Complexity: 1 Mod         Mabeline Caras, PT, DPT Acute Rehabilitation Services  Pager [phone] Office [place] 12/31/2018, 11:24 AM

## 2018-12-31 NOTE — [place] FL2 (Signed)
[place] MEDICAID FL2 LEVEL OF CARE SCREENING TOOL     IDENTIFICATION  Patient Name: Lawrence Warner Birthdate: [birth year] Sex: male Admission Date (Current Location): 12/28/2018  [facility] and [place] Number:  Herbalist and Address:  [facility] [place], [place] [address], [place], [place] [address]      Provider Number: [id number]  Attending Physician Name and Address:  Charolotte Capuchin, MD  Relative Name and Phone Number:  [name] (son) [phone]    Current Level of Care: Hospital Recommended Level of Care: [place] Prior Approval Number:    Date Approved/Denied:   PASRR Number: [id number]  Discharge Plan: SNF    Current Diagnoses: Patient Active Problem List   Diagnosis Date Noted  . Acute on chronic combined systolic and diastolic heart failure ([place]) 11/23/2018  . Acute exacerbation of CHF (congestive heart failure) ([place]) 11/22/2018  . CAD (coronary artery disease) 11/22/2018  . Type 2 diabetes mellitus ([place]) 11/22/2018  . QT prolongation 11/22/2018  . CHF (congestive heart failure) ([place]) 11/12/2018  . Coronary artery disease involving native coronary artery of native heart without angina pectoris 01/08/2018  . Generalized weakness 05/17/2017  . Hypertensive emergency 05/17/2017  . RSV bronchitis   . Bronchospasm with bronchitis, acute 03/14/2017  . Acute on chronic diastolic CHF (congestive heart failure) ([place]) 11/26/2016  . CKD (chronic kidney disease) stage 3, GFR 30-59 ml/min 11/26/2016  . LBBB (left bundle branch block) 11/26/2016  . Hypoxia   . Acute respiratory failure with hypoxia ([place]) 07/20/2016  . Chronic respiratory failure with hypoxia ([place]) 02/28/2016  . Dyslipidemia 02/28/2016  . Constipation 12/07/2015  . Bradycardia 11/28/2015  . Port catheter in place 10/24/2015  . GERD (gastroesophageal reflux disease) 10/09/2015  . Gait instability 10/09/2015  . Chest pain 10/05/2015  . Diet-controlled diabetes  mellitus ([place]) 05/07/2015  . Hx of CABG 05/07/2015  . Essential hypertension 05/07/2015  . Muscle cramps 04/25/2015  . Shortness of breath 04/11/2015  . Idiopathic thrombocytopenic purpura ([place]) 09/27/2014    Orientation RESPIRATION BLADDER Height & Weight     Self, Time, Situation, Place  O2(6L/min nasal cannula) Continent, External catheter Weight: 183 lb 13.8 oz (83.4 kg) Height:  5\' 7"  (170.2 cm)  BEHAVIORAL SYMPTOMS/MOOD NEUROLOGICAL BOWEL NUTRITION STATUS      Continent Diet(see discharge summary)  AMBULATORY STATUS COMMUNICATION OF NEEDS Skin   Limited Assist Verbally Other (Comment), Skin abrasions(abrasions on feet and elbows, left foot toe blister)                       Personal Care Assistance Level of Assistance  Bathing, Feeding, Dressing, Total care Bathing Assistance: Limited assistance Feeding assistance: Independent Dressing Assistance: Limited assistance Total Care Assistance: Limited assistance   Functional Limitations Info  Sight, Speech, Hearing Sight Info: Impaired Hearing Info: Impaired Speech Info: Adequate    SPECIAL CARE FACTORS FREQUENCY  PT (By licensed PT), OT (By licensed OT)     PT Frequency: min 5x weekly OT Frequency: min 5x weekly            Contractures Contractures Info: Not present    Additional Factors Info  Code Status, Allergies Code Status Info: DNR Allergies Info: Alfuzosin Hcl Er, Imdur (isosorbide dinitrate), penicillins, tape           Current Medications (12/31/2018):  This is the current hospital active medication list Current Facility-Administered Medications  Medication Dose Route Frequency Provider Last Rate Last Dose  . 0.9 %  sodium chloride infusion   Intravenous Continuous Charolotte Capuchin, MD 50 mL/hr at 12/31/18 1113    . acetaminophen (TYLENOL) tablet 650 mg  650 mg Oral Q6H PRN Shela Leff, MD   650 mg at 12/29/18 2133  . brimonidine (ALPHAGAN) 0.2 % ophthalmic solution 1 drop  1 drop Both  Eyes TID Charolotte Capuchin, MD   1 drop at 12/31/18 0911  . cycloSPORINE (RESTASIS) 0.05 % ophthalmic emulsion 1 drop  1 drop Both Eyes Q12H Charolotte Capuchin, MD   1 drop at 12/31/18 1112  . [START ON 01/01/2019] furosemide (LASIX) tablet 20 mg  20 mg Oral Daily Satsangi, Anurag, MD      . heparin injection 5,000 Units  5,000 Units Subcutaneous Q8H Shela Leff, MD   5,000 Units at 12/31/18 1341  . insulin aspart (novoLOG) injection 0-9 Units  0-9 Units Subcutaneous TID WC Shela Leff, MD   2 Units at 12/31/18 1321  . losartan (COZAAR) tablet 100 mg  100 mg Oral Daily Charolotte Capuchin, MD   100 mg at 12/31/18 0911  . magnesium oxide (MAG-OX) tablet 400 mg  400 mg Oral Daily Charolotte Capuchin, MD   400 mg at 12/31/18 0911  . simvastatin (ZOCOR) tablet 20 mg  20 mg Oral QHS Charolotte Capuchin, MD   20 mg at 12/30/18 2138   Facility-Administered Medications Ordered in Other Encounters  Medication Dose Route Frequency Provider Last Rate Last Dose  . cyanocobalamin ((VITAMIN B-12)) injection 1,000 mcg  1,000 mcg Subcutaneous Q30 days [name], MD   1,000 mcg at 03/13/16 1348  . cyanocobalamin ((VITAMIN B-12)) injection 1,000 mcg  1,000 mcg Subcutaneous Q30 days [name], MD   1,000 mcg at 05/14/16 1001  . cyanocobalamin ((VITAMIN B-12)) injection 1,000 mcg  1,000 mcg Subcutaneous Q30 days [name], MD   1,000 mcg at 09/10/16 1022  . cyanocobalamin ((VITAMIN B-12)) injection 1,000 mcg  1,000 mcg Subcutaneous Q30 days [name], MD   1,000 mcg at 12/03/16 1038  . romiPLOStim (NPLATE) injection 50 mcg  50 mcg Subcutaneous Weekly [name], MD   50 mcg at 02/11/17 1039  . romiPLOStim (NPLATE) injection 50 mcg  50 mcg Subcutaneous Weekly [name], MD   50 mcg at 04/01/17 1150     Discharge Medications: Please see discharge summary for a list of discharge medications.  Relevant Imaging Results:  Relevant Lab  Results:   Additional Information SSN: [id number]  Alberteen Sam, LCSW

## 2018-12-31 NOTE — TOC Initial Note (Signed)
Transition of Care [facility]) - Initial/Assessment Note    Patient Details  Name: Lawrence Warner MRN: [id number] Date of Birth: 22-Aug-1918  Transition of Care [facility]) CM/SW Contact:    Alberteen Sam, [place] Phone Number: [phone] 12/31/2018, 2:33 PM  Clinical Narrative:                  CSW spoke with patient son and POA [name] regarding recommendations of SNF at discharge. [name] confirms patient is from [place] and he is agreeable to patient going to short term rehab at discharge. [name] reports patient has been to SNF in the past and he states he has used all his SNF days. He reports they are willing to private pay for patient to go to [facility] [name] agreeable for referral to be sent for potential bed offer.   [name] gave CSW permission to speak with his wife as well Jana Half if he is unavailable when called.   CSW has faxed referral to [facility], pending bed offer.   Expected Discharge Plan: Skilled Nursing Facility Barriers to Discharge: Continued Medical Work up   Patient Goals and CMS Choice   CMS Medicare.gov Compare Post Acute Care list provided to:: Patient Represenative (must comment)(son [name]) Choice offered to / list presented to : Adult Children  Expected Discharge Plan and Services Expected Discharge Plan: [place] Acute Care Choice: [place] Living arrangements for the past 2 months: Independent Living Facility([place])                                      Prior Living Arrangements/Services Living arrangements for the past 2 months: Independent Living Facility( [facility]) Lives with:: Self Patient language and need for interpreter reviewed:: Yes Do you feel safe going back to the place where you live?: No   needs short term rehab  Need for Family Participation in Patient Care: Yes (Comment) Care giver support system in place?: Yes (comment)   Criminal Activity/Legal  Involvement Pertinent to Current Situation/Hospitalization: No - Comment as needed  Activities of Daily Living Home Assistive Devices/Equipment: Cane (specify quad or straight) ADL Screening (condition at time of admission) Patient's cognitive ability adequate to safely complete daily activities?: Yes Is the patient deaf or have difficulty hearing?: Yes Does the patient have difficulty seeing, even when wearing glasses/contacts?: Yes Does the patient have difficulty concentrating, remembering, or making decisions?: No Patient able to express need for assistance with ADLs?: Yes Does the patient have difficulty dressing or bathing?: Yes Independently performs ADLs?: Yes (appropriate for developmental age) Does the patient have difficulty walking or climbing stairs?: Yes Weakness of Legs: Both Weakness of Arms/Hands: None  Permission Sought/Granted Permission sought to share information with : Case Manager, Customer service manager, Family Supports Permission granted to share information with : Yes, Verbal Permission Granted  Share Information with NAME: [name]  Permission granted to share info w AGENCY: SNFs  Permission granted to share info w Relationship: son  Permission granted to share info w Contact Information: [phone]  Emotional Assessment Appearance:: Appears stated age Attitude/Demeanor/Rapport: Unable to Assess Affect (typically observed): Unable to Assess Orientation: : Oriented to Self, Oriented to Place, Oriented to  Time, Oriented to Situation Alcohol / Substance Use: Not Applicable Psych Involvement: No (comment)  Admission diagnosis:  Acute on chronic diastolic congestive heart failure (HCC) [I50.33] Chronic kidney disease, unspecified CKD stage [  N18.9] Patient Active Problem List   Diagnosis Date Noted  . Acute on chronic combined systolic and diastolic heart failure ([place]) 11/23/2018  . Acute exacerbation of CHF (congestive heart failure) ([place]) 11/22/2018   . CAD (coronary artery disease) 11/22/2018  . Type 2 diabetes mellitus ([place]) 11/22/2018  . QT prolongation 11/22/2018  . CHF (congestive heart failure) ([place]) 11/12/2018  . Coronary artery disease involving native coronary artery of native heart without angina pectoris 01/08/2018  . Generalized weakness 05/17/2017  . Hypertensive emergency 05/17/2017  . RSV bronchitis   . Bronchospasm with bronchitis, acute 03/14/2017  . Acute on chronic diastolic CHF (congestive heart failure) ([place]) 11/26/2016  . CKD (chronic kidney disease) stage 3, GFR 30-59 ml/min 11/26/2016  . LBBB (left bundle branch block) 11/26/2016  . Hypoxia   . Acute respiratory failure with hypoxia ([place]) 07/20/2016  . Chronic respiratory failure with hypoxia ([place]) 02/28/2016  . Dyslipidemia 02/28/2016  . Constipation 12/07/2015  . Bradycardia 11/28/2015  . Port catheter in place 10/24/2015  . GERD (gastroesophageal reflux disease) 10/09/2015  . Gait instability 10/09/2015  . Chest pain 10/05/2015  . Diet-controlled diabetes mellitus ([place]) 05/07/2015  . Hx of CABG 05/07/2015  . Essential hypertension 05/07/2015  . Muscle cramps 04/25/2015  . Shortness of breath 04/11/2015  . Idiopathic thrombocytopenic purpura ([place]) 09/27/2014   PCP:  Clovia Cuff, MD Pharmacy:   [facility] [place] - [address] [place] [address] Phone: [phone] Fax: [phone]  [facility] [address], [place] - [address] [place] [place] [address] Phone: [phone] Fax: [phone]     Social Determinants of Health (SDOH) Interventions    Readmission Risk Interventions Readmission Risk Prevention Plan 11/24/2018  Transportation Screening Complete  Home Care Screening Complete  Medication Review (RN CM) Complete  Some recent data might be hidden

## 2018-12-31 NOTE — Progress Notes (Signed)
PROGRESS NOTE    Lawrence Warner  CZY:[id number] DOB: June 18, 1918 DOA: 12/28/2018 PCP: Clovia Cuff, MD    Brief Narrative:  85 year old Caucasian male with extensive past medical history who presented with dyspnea of unknown duration.  Patient was unsure whether he was taking his Lasix or not.  In the emergency department patient had an elevated BNP as well as a chest x-ray showing stable cardiomegaly with mild interstitial edema.  Assessment & Plan:   Acute exacerbation of chronic diastolic congestive heart failure: Last echo done 11/12/2018 showing normal LVEF of 50 to 55% and left ventricular diastolic parameters consistent with impaired relaxation.  Suspect acute CHF exacerbation is possibly related to Lasix nonadherence.  Patient states he manages his medications on his own. -Cardiac monitoring -Switch from IV Lasix 20mg  BID to home PO Lasix 20mg  daily given rise in Cr -Monitor intake and output, daily weights, low-sodium diet with fluid restriction -Continue to monitor renal function -Case management, consult/PT/OT: Patient will likely need SNF placement  Elevated Creatinine -Likely due to mild overdiuresis -start gentle IV fluids for 10 hours -switch to PO Lasix 20mg  daily starting tomorrow and d/c IV Lasix BID  Acute on chronic hypoxic respiratory failure: Suspect related to acute exacerbation of chronic diastolic congestive heart failure. Patient uses 2.5 L supplemental oxygen at home and is currently requiring 4 L. -Management of CHF exacerbation as above -Continuous pulse ox -Continue supplemental oxygen to keep oxygen saturation above 92%  CKD stage III-IV -Stable.  Creatinine 1.5 at present.  Baseline creatinine 1.6-2.0.  Continue to monitor renal function with IV diuresis.  Mild elevation of troponin secondary to demand ischemia: High-sensitivity troponin 23-->27.  EKG without acute ischemic changes.  Patient denies chest pain. -Cardiac monitoring  QT  prolongation on EKG -Cardiac monitoring -Keep potassium above 4 and magnesium above 2 -Avoid QT prolonging drugs if possible  Diet controlled type 2 diabetes -Sliding scale insulin sensitive and CBG checks.   DVT prophylaxis: Heparin SQ Code Status:DNR   Consultants:   n/a  Procedures:   n/a  Antimicrobials:  n/a    Subjective: Patient denies any acute complaints and feels that he is at his approximate baseline breathing.  Objective: Vitals:   12/31/18 0459 12/31/18 0612 12/31/18 0700 12/31/18 0900  BP:  (!) 145/70 (!) 145/56   Pulse:  77 (!) 54 (!) 48  Resp:  14 (!) 30 (!) 32  Temp:  98.3 F (36.8 C) 97.8 F (36.6 C)   TempSrc:  Oral Oral   SpO2:  92% 90% 99%  Weight: 83.4 kg     Height:        Intake/Output Summary (Last 24 hours) at 12/31/2018 1028 Last data filed at 12/31/2018 0845 Gross per 24 hour  Intake 840 ml  Output 650 ml  Net 190 ml   Filed Weights   12/29/18 0405 12/30/18 0500 12/31/18 0459  Weight: 85.3 kg 81.7 kg 83.4 kg    Examination:  General exam: Appears calm and comfortable  Respiratory system: Mostly clear to auscultation bilaterally. Respiratory effort normal. Cardiovascular system: S1 & S2 heard, RRR. No JVD, murmurs, rubs, gallops or clicks. No pedal edema. Gastrointestinal system: Abdomen is nondistended, soft and nontender. No organomegaly or masses felt. Normal bowel sounds heard. Central nervous system: Alert and oriented. No focal neurological deficits. Extremities: Symmetric 5 x 5 power. Skin: No rashes, lesions or ulcers    Scheduled Meds: . brimonidine  1 drop Both Eyes TID  . cycloSPORINE  1 drop Both Eyes  Q12H  . [START ON 01/01/2019] furosemide  20 mg Oral Daily  . heparin injection (subcutaneous)  5,000 Units Subcutaneous Q8H  . insulin aspart  0-9 Units Subcutaneous TID WC  . losartan  100 mg Oral Daily  . magnesium oxide  400 mg Oral Daily  . simvastatin  20 mg Oral QHS   Continuous Infusions: . sodium  chloride       LOS: 3 days    Time spent: 25 mins   Charolotte Capuchin, MD [facility] Pager [phone]  If 7PM-7AM, please contact night-coverage [email or web address] Password [facility] 12/31/2018, 10:28 AM

## 2019-01-01 LAB — BASIC METABOLIC PANEL
Anion gap: 8 (ref 5–15)
BUN: 50 mg/dL — ABNORMAL HIGH (ref 8–23)
CO2: 26 mmol/L (ref 22–32)
Calcium: 8.6 mg/dL — ABNORMAL LOW (ref 8.9–10.3)
Chloride: 105 mmol/L (ref 98–111)
Creatinine, Ser: 1.61 mg/dL — ABNORMAL HIGH (ref 0.61–1.24)
GFR calc Af Amer: 41 mL/min — ABNORMAL LOW (ref >60)
GFR calc non Af Amer: 35 mL/min — ABNORMAL LOW (ref >60)
Glucose, Bld: 125 mg/dL — ABNORMAL HIGH (ref 70–99)
Potassium: 4.8 mmol/L (ref 3.5–5.1)
Sodium: 139 mmol/L (ref 135–145)

## 2019-01-01 LAB — GLUCOSE, CAPILLARY
Glucose-Capillary: 110 mg/dL — ABNORMAL HIGH (ref 70–99)
Glucose-Capillary: 135 mg/dL — ABNORMAL HIGH (ref 70–99)
Glucose-Capillary: 135 mg/dL — ABNORMAL HIGH (ref 70–99)
Glucose-Capillary: 108 mg/dL — ABNORMAL HIGH (ref 70–99)
Glucose-Capillary: 138 mg/dL — ABNORMAL HIGH (ref 70–99)

## 2019-01-01 NOTE — Progress Notes (Addendum)
PROGRESS NOTE    Lawrence Warner  MVH:[id number] DOB: October 24, 1918 DOA: 12/28/2018 PCP: Clovia Cuff, MD    Brief Narrative:  85 year old Caucasian male with extensive past medical history who presented with dyspnea of unknown duration.  Patient was unsure whether he was taking his Lasix or not.  In the emergency department patient had an elevated BNP as well as a chest x-ray showing stable cardiomegaly with mild interstitial edema.  Assessment & Plan:   Acute exacerbation of chronic diastolic congestive heart failure: Last echo done 11/12/2018 showing normal LVEF of 50 to 55% and left ventricular diastolic parameters consistent with impaired relaxation.  Suspect acute CHF exacerbation is possibly related to Lasix nonadherence.  Patient states he manages his medications on his own. -Cardiac monitoring -Switch from IV Lasix 20mg  BID to home PO Lasix 20mg  daily given rise in Cr which has now improved -Monitor intake and output, daily weights, low-sodium diet with fluid restriction -Continue to monitor renal function -Case management, PT/OT: Family is looking into SNF placement and they are willing to pay out-of-pocket  Acutely elevated Creatinine in setting of CKD 3; resolved -Likely due to mild overdiuresis -Completed gentle IV fluids for 10 hours -Switched to PO Lasix 20mg  daily from IV Lasix BID  Acute on chronic hypoxic respiratory failure: Suspect related to acute exacerbation of chronic diastolic congestive heart failure. Patient uses 2.5 L supplemental oxygen at home and is currently requiring 6 L. -Management of CHF exacerbation as above -Continuous pulse ox -Continue supplemental oxygen to keep oxygen saturation above 92%  Mild elevation of troponin secondary to demand ischemia: High-sensitivity troponin 23-->27.  EKG without acute ischemic changes.  Patient denies chest pain. -Cardiac monitoring  QT prolongation on EKG -Cardiac monitoring -Keep potassium above 4 and  magnesium above 2 -Avoid QT prolonging drugs if possible  Diet controlled type 2 diabetes -Sliding scale insulin sensitive and CBG checks.   DVT prophylaxis: Heparin SQ Code Status:DNR   Consultants:   n/a  Procedures:   n/a  Antimicrobials:  n/a    Subjective: Patient denies any acute complaints and feels that he is at his approximate baseline breathing.  Objective: Vitals:   01/01/19 0904 01/01/19 1122 01/01/19 1154 01/01/19 1355  BP: (!) 128/36 127/62 (!) 138/29   Pulse: 67 (!) 49 (!) 38 68  Resp: 18 19 18    Temp: 98 F (36.7 C) 98 F (36.7 C) 97.7 F (36.5 C)   TempSrc: Oral  Oral   SpO2: 99% 98% 99%   Weight:      Height:        Intake/Output Summary (Last 24 hours) at 01/01/2019 1607 Last data filed at 01/01/2019 1300 Gross per 24 hour  Intake 1126.21 ml  Output 825 ml  Net 301.21 ml   Filed Weights   12/30/18 0500 12/31/18 0459 01/01/19 0620  Weight: 81.7 kg 83.4 kg 83.8 kg    Examination:  General exam: Appears calm and comfortable  Respiratory system: Mostly clear to auscultation bilaterally. Respiratory effort normal. Cardiovascular system: S1 & S2 heard, RRR. No JVD, murmurs, rubs, gallops or clicks. No pedal edema. Gastrointestinal system: Abdomen is nondistended, soft and nontender. No organomegaly or masses felt. Normal bowel sounds heard. Central nervous system: Alert and oriented. No focal neurological deficits. Extremities: Symmetric 5 x 5 power. Skin: No rashes, lesions or ulcers    Scheduled Meds: . brimonidine  1 drop Both Eyes TID  . cycloSPORINE  1 drop Both Eyes Q12H  . furosemide  20 mg Oral  Daily  . heparin injection (subcutaneous)  5,000 Units Subcutaneous Q8H  . insulin aspart  0-9 Units Subcutaneous TID WC  . losartan  100 mg Oral Daily  . magnesium oxide  400 mg Oral Daily  . simvastatin  20 mg Oral QHS   Continuous Infusions:    LOS: 4 days    Time spent: 25 mins   Charolotte Capuchin, MD [facility]  Pager [phone]  If 7PM-7AM, please contact night-coverage [email or web address] Password [email or web address] 01/01/2019, 4:07 PM

## 2019-01-01 NOTE — TOC Progression Note (Signed)
Transition of Care [facility]) - Progression Note    Patient Details  Name: Cougar Imel MRN: [id number] Date of Birth: 02-17-1919  Transition of Care [facility]) CM/SW [place], [place] Phone Number: 01/01/2019, 3:43 PM  Clinical Narrative:     CSW called and spoke with patient's daughter in law. CSW and her spoke about the patient's finances. [employer] is the patient's supplemental plan, but he does not have any SNF coverage. He is not eligible for [facility] SNF services. He has been getting home health through [place] at his independent living facility. He has been in and out of the hospital and has used all of his SNF days.   CSW spoke with [facility] Supervisor, Olga Coaster. Hospital staff are not able to do anything to assist the patient.   CSW relayed this to the patient's family. They will come together and make a plan. [facility] does not have any beds available at this time. CSW will plan on touching base with admissions director on Sunday.   For a 14 day stay at [facility], room and board would be $3,708.00. Therapy, prescriptions, and medical supplies would be billed separately. [facility] 14 day stay would be $4,032.00 plus the cost of therapy, prescriptions, and medical supplies.   CSW will continue to follow.     Expected Discharge Plan: [place] Barriers to Discharge: Continued Medical Work up  Expected Discharge Plan and Services Expected Discharge Plan: [place] Choice: [place] arrangements for the past 2 months: Independent Living Facility([place])                                       Social Determinants of Health (SDOH) Interventions    Readmission Risk Interventions Readmission Risk Prevention Plan 11/24/2018  Transportation Screening Complete  Home Care Screening Complete  Medication Review (RN CM) Complete  Some recent data might be hidden

## 2019-01-01 NOTE — Care Management Important Message (Signed)
Important Message  Patient Details  Name: Lawrence Warner MRN: [id number] Date of Birth: 1919-02-07   Medicare Important Message Given:  Yes     Shelda Altes 01/01/2019, 2:34 PM

## 2019-01-02 DIAGNOSIS — N183 Chronic kidney disease, stage 3 unspecified: Secondary | ICD-10-CM

## 2019-01-02 DIAGNOSIS — I5023 Acute on chronic systolic (congestive) heart failure: Secondary | ICD-10-CM

## 2019-01-02 DIAGNOSIS — E119 Type 2 diabetes mellitus without complications: Secondary | ICD-10-CM

## 2019-01-02 DIAGNOSIS — R9431 Abnormal electrocardiogram [ECG] [EKG]: Secondary | ICD-10-CM

## 2019-01-02 DIAGNOSIS — J9601 Acute respiratory failure with hypoxia: Secondary | ICD-10-CM

## 2019-01-02 LAB — BASIC METABOLIC PANEL
Anion gap: 9 (ref 5–15)
BUN: 54 mg/dL — ABNORMAL HIGH (ref 8–23)
CO2: 26 mmol/L (ref 22–32)
Calcium: 8.8 mg/dL — ABNORMAL LOW (ref 8.9–10.3)
Chloride: 107 mmol/L (ref 98–111)
Creatinine, Ser: 1.67 mg/dL — ABNORMAL HIGH (ref 0.61–1.24)
GFR calc Af Amer: 39 mL/min — ABNORMAL LOW (ref >60)
GFR calc non Af Amer: 34 mL/min — ABNORMAL LOW (ref >60)
Glucose, Bld: 118 mg/dL — ABNORMAL HIGH (ref 70–99)
Potassium: 4.8 mmol/L (ref 3.5–5.1)
Sodium: 142 mmol/L (ref 135–145)

## 2019-01-02 LAB — GLUCOSE, CAPILLARY
Glucose-Capillary: 110 mg/dL — ABNORMAL HIGH (ref 70–99)
Glucose-Capillary: 117 mg/dL — ABNORMAL HIGH (ref 70–99)
Glucose-Capillary: 128 mg/dL — ABNORMAL HIGH (ref 70–99)
Glucose-Capillary: 161 mg/dL — ABNORMAL HIGH (ref 70–99)

## 2019-01-02 NOTE — Plan of Care (Signed)
  Problem: Clinical Measurements: Goal: Ability to maintain clinical measurements within normal limits will improve Outcome: Progressing Goal: Will remain free from infection Outcome: Progressing Goal: Diagnostic test results will improve Outcome: Progressing Goal: Respiratory complications will improve Outcome: Progressing Goal: Cardiovascular complication will be avoided Outcome: Progressing   Problem: Clinical Measurements: Goal: Respiratory complications will improve Outcome: Progressing   Problem: Clinical Measurements: Goal: Cardiovascular complication will be avoided Outcome: Progressing   Problem: Coping: Goal: Level of anxiety will decrease Outcome: Progressing   Problem: Activity: Goal: Risk for activity intolerance will decrease Outcome: Progressing   Problem: Elimination: Goal: Will not experience complications related to bowel motility Outcome: Progressing Goal: Will not experience complications related to urinary retention Outcome: Progressing   Problem: Pain Managment: Goal: General experience of comfort will improve Outcome: Progressing   Problem: Safety: Goal: Ability to remain free from injury will improve Outcome: Progressing   Problem: Skin Integrity: Goal: Risk for impaired skin integrity will decrease Outcome: Progressing

## 2019-01-02 NOTE — Progress Notes (Addendum)
PROGRESS NOTE  Lawrence Warner JSH:[id number] DOB: 17-Nov-1918 DOA: 12/28/2018 PCP: Clovia Cuff, MD  HPI/Recap of past 24 hours: Patient seen and examined at bedside he was helped to sit at the side of his bed to eat he is doing well he stated he is doing fine denies any new complaints  Assessment/Plan: Principal Problem:   Acute exacerbation of CHF (congestive heart failure) ([place]) Active Problems:   Diet-controlled diabetes mellitus ([place])   Acute respiratory failure with hypoxia (H[facility])   CKD (chronic kidney disease) stage 3, GFR 30-59 ml/min   QT prolongation  #1 acute exacerbation of chronic diastolic congestive heart failure continue to monitor renal function daily weights low-sodium diet and fluid restriction He was switched from IV Lasix to p.o. Lasix  2.  Acute elevated creatinine in the setting of chronic kidney disease that has resolved, he was switched from IV to p.o. to Lasix  3.  Acute kidney injury acute on chronic kidney disease stage III  4.  Acute on chronic respiratory hypoxic respiratory failure is on chronic O2 at 2-1/2 L/min at home  Code Status: DNR  Severity of Illness: The appropriate patient status for this patient is INPATIENT. Inpatient status is judged to be reasonable and necessary in order to provide the required intensity of service to ensure the patient's safety. The patient's presenting symptoms, physical exam findings, and initial radiographic and laboratory data in the context of their chronic comorbidities is felt to place them at high risk for further clinical deterioration. Furthermore, it is not anticipated that the patient will be medically stable for discharge from the hospital within 2 midnights of admission. The following factors support the patient status of inpatient.   " The patient's presenting symptoms include QT prolongation. " The worrisome physical exam findings include QT prolongation. " The initial radiographic and laboratory  data are worrisome because of CHF and elevated creatinine. " The chronic co-morbidities include diabetes mellitus ,chronic hypoxic respiratory failure .   * I certify that at the point of admission it is my clinical judgment that the patient will require inpatient hospital care spanning beyond 2 midnights from the point of admission due to high intensity of service, high risk for further deterioration and high frequency of surveillance required.*     Family Communication: Discussed with patient  Disposition Plan: SNF   Consultants:  None  Procedures:  None  Antimicrobials:  None  DVT prophylaxis: Subcu heparin   Objective: Vitals:   01/01/19 2111 01/02/19 0404 01/02/19 0404 01/02/19 0722  BP: (!) 157/47  (!) 126/41   Pulse: (!) 54  (!) 57   Resp: 20  19   Temp: 98 F (36.7 C)  98 F (36.7 C)   TempSrc: Oral  Oral   SpO2: 100%  97%   Weight:  83.7 kg  82.1 kg  Height:        Intake/Output Summary (Last 24 hours) at 01/02/2019 0854 Last data filed at 01/02/2019 0700 Gross per 24 hour  Intake 120 ml  Output 1675 ml  Net -1555 ml   Filed Weights   01/01/19 0620 01/02/19 0404 01/02/19 0722  Weight: 83.8 kg 83.7 kg 82.1 kg   Body mass index is 28.36 kg/m.  Exam:  . General: 85 y.o. year-old male well developed well nourished in no acute distress.  Alert and oriented x3.  Frail chronically ill looking but in good spirit . Cardiovascular: Regular rate and rhythm with no rubs or gallops.  No thyromegaly or JVD  noted.   . Respiratory: Clear to auscultation with no wheezes or rales. Good inspiratory effort. . Abdomen: Soft nontender nondistended with normal bowel sounds x4 quadrants. . Musculoskeletal: No lower extremity edema. 2/4 pulses in all 4 extremities. . Skin: No ulcerative lesions noted or rashes, . Psychiatry: Mood is appropriate for condition and setting    Data Reviewed: CBC: Recent Labs  Lab 12/28/18 1940  WBC 6.5  NEUTROABS 5.0  HGB 10.8*   HCT 35.7*  MCV 98.3  PLT 818   Basic Metabolic Panel: Recent Labs  Lab 12/28/18 2348 12/30/18 0400 12/31/18 0356 01/01/19 0424 01/02/19 0416  NA 137 138 139 139 142  K 4.5 4.5 4.4 4.8 4.8  CL 101 102 103 105 107  CO2 24 25 26 26 26   GLUCOSE 142* 106* 121* 125* 118*  BUN 28* 33* 46* 50* 54*  CREATININE 1.53* 1.57* 2.02* 1.61* 1.67*  CALCIUM 8.5* 8.6* 8.6* 8.6* 8.8*  MG 2.1  --   --   --   --    GFR: Estimated Creatinine Clearance: 25.3 mL/min (A) (by C-G formula based on SCr of 1.67 mg/dL (H)). Liver Function Tests: Recent Labs  Lab 12/28/18 1940  AST 21  ALT 12  ALKPHOS 54  BILITOT 0.7  PROT 5.9*  ALBUMIN 2.2*   No results for input(s): LIPASE, AMYLASE in the last 168 hours. No results for input(s): AMMONIA in the last 168 hours. Coagulation Profile: No results for input(s): INR, PROTIME in the last 168 hours. Cardiac Enzymes: No results for input(s): CKTOTAL, CKMB, CKMBINDEX, TROPONINI in the last 168 hours. BNP (last 3 results) No results for input(s): PROBNP in the last 8760 hours. HbA1C: No results for input(s): HGBA1C in the last 72 hours. CBG: Recent Labs  Lab 01/01/19 1102 01/01/19 1304 01/01/19 1629 01/01/19 2226 01/02/19 0656  GLUCAP 135* 135* 108* 138* 110*   Lipid Profile: No results for input(s): CHOL, HDL, LDLCALC, TRIG, CHOLHDL, LDLDIRECT in the last 72 hours. Thyroid Function Tests: No results for input(s): TSH, T4TOTAL, FREET4, T3FREE, THYROIDAB in the last 72 hours. Anemia Panel: No results for input(s): VITAMINB12, FOLATE, FERRITIN, TIBC, IRON, RETICCTPCT in the last 72 hours. Urine analysis:    Component Value Date/Time   COLORURINE YELLOW 11/11/2018 1747   APPEARANCEUR CLEAR 11/11/2018 1747   LABSPEC 1.018 11/11/2018 1747   PHURINE 6.0 11/11/2018 1747   GLUCOSEU NEGATIVE 11/11/2018 1747   HGBUR NEGATIVE 11/11/2018 1747   BILIRUBINUR NEGATIVE 11/11/2018 1747   KETONESUR NEGATIVE 11/11/2018 1747   PROTEINUR 100 (A) 11/11/2018  1747   NITRITE NEGATIVE 11/11/2018 1747   LEUKOCYTESUR NEGATIVE 11/11/2018 1747   Sepsis Labs: @LABRCNTIP (procalcitonin:4,lacticidven:4)  ) Recent Results (from the past 240 hour(s))  Novel Coronavirus, NAA (Hosp order, Send-out to Ref Lab; TAT 18-24 hrs     Status: None   Collection Time: 12/28/18 10:23 PM   Specimen: Nasopharyngeal Swab; Respiratory  Result Value Ref Range Status   SARS-CoV-2, NAA NOT DETECTED NOT DETECTED Final    Comment: (NOTE) This nucleic acid amplification test was developed and its performance characteristics determined by [employer]. Nucleic acid amplification tests include PCR and TMA. This test has not been FDA cleared or approved. This test has been authorized by FDA under an Emergency Use Authorization (EUA). This test is only authorized for the duration of time the declaration that circumstances exist justifying the authorization of the emergency use of in vitro diagnostic tests for detection of SARS-CoV-2 virus and/or diagnosis of COVID-19 infection under section  564(b)(1) of the Act, 21 U.S.C. 585IDP-8(E) (1), unless the authorization is terminated or revoked sooner. When diagnostic testing is negative, the possibility of a false negative result should be considered in the context of a patient's recent exposures and the presence of clinical signs and symptoms consistent with COVID-19. An individual without symptoms of COVID- 19 and who is not shedding SARS-CoV-2 vi rus would expect to have a negative (not detected) result in this assay. Performed At: [facility] [address] [place], [place] [id number] Rush Farmer MD RX:[id number]    [place]  Final    Comment: Performed at [facility], [place] [address] [place], [place] [address]      Studies: No results found.  Scheduled Meds: . brimonidine  1 drop Both Eyes TID  . cycloSPORINE  1 drop Both Eyes Q12H  . furosemide  20 mg Oral  Daily  . heparin injection (subcutaneous)  5,000 Units Subcutaneous Q8H  . insulin aspart  0-9 Units Subcutaneous TID WC  . losartan  100 mg Oral Daily  . magnesium oxide  400 mg Oral Daily  . simvastatin  20 mg Oral QHS    Continuous Infusions:   LOS: 5 days     Cristal Deer, MD [facility]  To reach me or the doctor on call, go to: [email or web address] Password [facility]  01/02/2019, 8:54 AM

## 2019-01-03 LAB — GLUCOSE, CAPILLARY
Glucose-Capillary: 101 mg/dL — ABNORMAL HIGH (ref 70–99)
Glucose-Capillary: 119 mg/dL — ABNORMAL HIGH (ref 70–99)
Glucose-Capillary: 132 mg/dL — ABNORMAL HIGH (ref 70–99)
Glucose-Capillary: 120 mg/dL — ABNORMAL HIGH (ref 70–99)

## 2019-01-03 NOTE — TOC Progression Note (Signed)
Transition of Care [facility]) - Progression Note    Patient Details  Name: Lawrence Warner MRN: [id number] Date of Birth: 1918-04-30  Transition of Care [facility]) CM/SW [place], [place] Phone Number: [phone] 01/03/2019, 4:56 PM  Clinical Narrative:     CSW received a call from [facility] at [facility] and she stated that she did not have a bed for patient.CSW reached out for patient's son and asked him to make a list of a couple facilities that they would like for patient to go. Patient's son would like for him to go near patient's home.   TOC team will continue to follow for discharge planning needs.  Expected Discharge Plan: [place] Barriers to Discharge: Continued Medical Work up  Expected Discharge Plan and Services Expected Discharge Plan: [place] Choice: [place] arrangements for the past 2 months: Independent Living Facility([place])                                       Social Determinants of Health (SDOH) Interventions    Readmission Risk Interventions Readmission Risk Prevention Plan 11/24/2018  Transportation Screening Complete  Home Care Screening Complete  Medication Review (RN CM) Complete  Some recent data might be hidden

## 2019-01-03 NOTE — Progress Notes (Signed)
PROGRESS NOTE  Lawrence Warner PNT:[id number] DOB: [birth year] DOA: 12/28/2018 PCP: Clovia Cuff, MD  HPI/Recap of past 24 hours: Patient seen and examined at bedside he was helped to sit at the side of his bed to eat he is doing well he stated he is doing fine denies any new complaints  Assessment/Plan: Principal Problem:   Acute exacerbation of CHF (congestive heart failure) ([place]) Active Problems:   Diet-controlled diabetes mellitus ([place])   Acute respiratory failure with hypoxia (HCC)   CKD (chronic kidney disease) stage 3, GFR 30-59 ml/min   QT prolongation  #1 acute exacerbation of chronic diastolic congestive heart failure continue to monitor renal function daily weights low-sodium diet and fluid restriction He was switched from IV Lasix to p.o. Lasix  2.  Acute elevated creatinine in the setting of chronic kidney disease that has resolved, he was switched from IV to p.o. to Lasix  3.  Acute kidney injury acute on chronic kidney disease stage III  4.  Acute on chronic respiratory hypoxic respiratory failure is on chronic O2 at 2-1/2 L/min at home Code Status: DNR  Severity of Illness: The appropriate patient status for this patient is INPATIENT. Inpatient status is judged to be reasonable and necessary in order to provide the required intensity of service to ensure the patient's safety. The patient's presenting symptoms, physical exam findings, and initial radiographic and laboratory data in the context of their chronic comorbidities is felt to place them at high risk for further clinical deterioration. Furthermore, it is not anticipated that the patient will be medically stable for discharge from the hospital within 2 midnights of admission. The following factors support the patient status of inpatient.   " The patient's presenting symptoms include QT prolongation. " The worrisome physical exam findings include QT prolongation. " The initial radiographic and laboratory data  are worrisome because of CHF and elevated creatinine. " The chronic co-morbidities include diabetes mellitus ,chronic hypoxic respiratory failure .   * I certify that at the point of admission it is my clinical judgment that the patient will require inpatient hospital care spanning beyond 2 midnights from the point of admission due to high intensity of service, high risk for further deterioration and high frequency of surveillance required.*     Family Communication: Discussed with patient  Disposition Plan: Home   Consultants:  None  Procedures:  None  Antimicrobials:  None  DVT prophylaxis: Subcu heparin   Objective: Vitals:   01/02/19 1954 01/03/19 0546 01/03/19 0552 01/03/19 1048  BP: (!) 131/41 (!) 168/50  (!) 143/78  Pulse: (!) 58 (!) 55  (!) 56  Resp: 16 18    Temp: 98 F (36.7 C) 97.8 F (36.6 C)    TempSrc: Oral Oral    SpO2: 97% 98%    Weight:   82.1 kg   Height:   5\' 7"  (1.702 m)     Intake/Output Summary (Last 24 hours) at 01/03/2019 1148 Last data filed at 01/03/2019 0807 Gross per 24 hour  Intake 360 ml  Output 525 ml  Net -165 ml   Filed Weights   01/02/19 0404 01/02/19 0722 01/03/19 0552  Weight: 83.7 kg 82.1 kg 82.1 kg   Body mass index is 28.35 kg/m.  Exam:  . General: 85 y.o. year-old male well developed frail looking, in no acute distress.  Alert and oriented x3.  Frail chronically ill looking but in good spirit . Cardiovascular: Regular rate and rhythm with no rubs or gallops.  No  thyromegaly or JVD noted.   Marland Kitchen Respiratory: Clear to auscultation with no wheezes or rales. Good inspiratory effort. . Abdomen: Soft nontender nondistended with normal bowel sounds x4 quadrants. . Musculoskeletal: No lower extremity edema. 2/4 pulses in all 4 extremities. . Skin: No ulcerative lesions noted or rashes, . Psychiatry: Mood is appropriate for condition and setting    Data Reviewed: CBC: Recent Labs  Lab 12/28/18 1940  WBC 6.5   NEUTROABS 5.0  HGB 10.8*  HCT 35.7*  MCV 98.3  PLT 194   Basic Metabolic Panel: Recent Labs  Lab 12/28/18 2348 12/30/18 0400 12/31/18 0356 01/01/19 0424 01/02/19 0416  NA 137 138 139 139 142  K 4.5 4.5 4.4 4.8 4.8  CL 101 102 103 105 107  CO2 24 25 26 26 26   GLUCOSE 142* 106* 121* 125* 118*  BUN 28* 33* 46* 50* 54*  CREATININE 1.53* 1.57* 2.02* 1.61* 1.67*  CALCIUM 8.5* 8.6* 8.6* 8.6* 8.8*  MG 2.1  --   --   --   --    GFR: Estimated Creatinine Clearance: 25.3 mL/min (A) (by C-G formula based on SCr of 1.67 mg/dL (H)). Liver Function Tests: Recent Labs  Lab 12/28/18 1940  AST 21  ALT 12  ALKPHOS 54  BILITOT 0.7  PROT 5.9*  ALBUMIN 2.2*   No results for input(s): LIPASE, AMYLASE in the last 168 hours. No results for input(s): AMMONIA in the last 168 hours. Coagulation Profile: No results for input(s): INR, PROTIME in the last 168 hours. Cardiac Enzymes: No results for input(s): CKTOTAL, CKMB, CKMBINDEX, TROPONINI in the last 168 hours. BNP (last 3 results) No results for input(s): PROBNP in the last 8760 hours. HbA1C: No results for input(s): HGBA1C in the last 72 hours. CBG: Recent Labs  Lab 01/02/19 0656 01/02/19 1110 01/02/19 1556 01/02/19 1950 01/03/19 0534  GLUCAP 110* 117* 128* 161* 101*   Lipid Profile: No results for input(s): CHOL, HDL, LDLCALC, TRIG, CHOLHDL, LDLDIRECT in the last 72 hours. Thyroid Function Tests: No results for input(s): TSH, T4TOTAL, FREET4, T3FREE, THYROIDAB in the last 72 hours. Anemia Panel: No results for input(s): VITAMINB12, FOLATE, FERRITIN, TIBC, IRON, RETICCTPCT in the last 72 hours. Urine analysis:    Component Value Date/Time   COLORURINE YELLOW 11/11/2018 1747   APPEARANCEUR CLEAR 11/11/2018 1747   LABSPEC 1.018 11/11/2018 1747   PHURINE 6.0 11/11/2018 1747   GLUCOSEU NEGATIVE 11/11/2018 1747   HGBUR NEGATIVE 11/11/2018 1747   BILIRUBINUR NEGATIVE 11/11/2018 1747   KETONESUR NEGATIVE 11/11/2018 1747    PROTEINUR 100 (A) 11/11/2018 1747   NITRITE NEGATIVE 11/11/2018 1747   LEUKOCYTESUR NEGATIVE 11/11/2018 1747   Sepsis Labs: @LABRCNTIP (procalcitonin:4,lacticidven:4)  ) Recent Results (from the past 240 hour(s))  Novel Coronavirus, NAA (Hosp order, Send-out to Ref Lab; TAT 18-24 hrs     Status: None   Collection Time: 12/28/18 10:23 PM   Specimen: Nasopharyngeal Swab; Respiratory  Result Value Ref Range Status   SARS-CoV-2, NAA NOT DETECTED NOT DETECTED Final    Comment: (NOTE) This nucleic acid amplification test was developed and its performance characteristics determined by [employer]. Nucleic acid amplification tests include PCR and TMA. This test has not been FDA cleared or approved. This test has been authorized by FDA under an Emergency Use Authorization (EUA). This test is only authorized for the duration of time the declaration that circumstances exist justifying the authorization of the emergency use of in vitro diagnostic tests for detection of SARS-CoV-2 virus and/or diagnosis of COVID-19  infection under section 564(b)(1) of the Act, 21 U.S.C. 639EVQ-0(Q) (1), unless the authorization is terminated or revoked sooner. When diagnostic testing is negative, the possibility of a false negative result should be considered in the context of a patient's recent exposures and the presence of clinical signs and symptoms consistent with COVID-19. An individual without symptoms of COVID- 19 and who is not shedding SARS-CoV-2 vi rus would expect to have a negative (not detected) result in this assay. Performed At: [facility] [address] [place], [place] [id number] Rush Farmer MD UV:[id number]    [place]  Final    Comment: Performed at [facility], [place] [address] [place], [place] [address]      Studies: No results found.  Scheduled Meds: . brimonidine  1 drop Both Eyes TID  . cycloSPORINE  1 drop Both Eyes Q12H   . furosemide  20 mg Oral Daily  . heparin injection (subcutaneous)  5,000 Units Subcutaneous Q8H  . insulin aspart  0-9 Units Subcutaneous TID WC  . losartan  100 mg Oral Daily  . magnesium oxide  400 mg Oral Daily  . simvastatin  20 mg Oral QHS    Continuous Infusions:   LOS: 6 days     Cristal Deer, MD [facility]  To reach me or the doctor on call, go to: [email or web address] Password [facility]  01/03/2019, 11:48 AM

## 2019-01-03 NOTE — Progress Notes (Signed)
Patient weaned to 4L O2 via nasal canula; pt uses 2L PRN at home, was received at shift report this morning on 6L via nasal canula. Saturations are holding up at 95%+

## 2019-01-03 NOTE — Progress Notes (Addendum)
PROGRESS NOTE  Lawrence Warner [id number]:[id number] DOB: 02/11/1919 DOA: 12/28/2018 PCP: Clovia Cuff, MD  HPI/Recap of past 24 hours: Patient seen and examined at bedside he was helped to sit at the side of his bed to eat he is doing well he stated he is doing fine denies any new complaints  Subjective, January 03, 2019: Patient seen and examined he is sitting in a recliner.  Patient denies any complaints he is eating well his son [name] is at bedside  Assessment/Plan: Principal Problem:   Acute exacerbation of CHF (congestive heart failure) ([place]) Active Problems:   Diet-controlled diabetes mellitus ([place])   Acute respiratory failure with hypoxia (HCC)   CKD (chronic kidney disease) stage 3, GFR 30-59 ml/min   QT prolongation  #1 acute exacerbation of chronic diastolic congestive heart failure continue to monitor renal function daily weights low-sodium diet and fluid restriction He was switched from IV Lasix to p.o. Lasix  2.  Acute elevated creatinine in the setting of chronic kidney disease that has resolved, he was switched from IV to p.o. to Lasix  3.  Acute kidney injury acute on chronic kidney disease stage III  4.  Acute on chronic respiratory hypoxic respiratory failure is on chronic O2 at 2-1/2 L/min at home  Code Status: DNR  Severity of Illness: The appropriate patient status for this patient is INPATIENT. Inpatient status is judged to be reasonable and necessary in order to provide the required intensity of service to ensure the patient's safety. The patient's presenting symptoms, physical exam findings, and initial radiographic and laboratory data in the context of their chronic comorbidities is felt to place them at high risk for further clinical deterioration. Furthermore, it is not anticipated that the patient will be medically stable for discharge from the hospital within 2 midnights of admission. The following factors support the patient status of inpatient.   " The  patient's presenting symptoms include QT prolongation. " The worrisome physical exam findings include QT prolongation. " The initial radiographic and laboratory data are worrisome because of CHF and elevated creatinine. " The chronic co-morbidities include diabetes mellitus ,chronic hypoxic respiratory failure .   * I certify that at the point of admission it is my clinical judgment that the patient will require inpatient hospital care spanning beyond 2 midnights from the point of admission due to high intensity of service, high risk for further deterioration and high frequency of surveillance required.*     Family Communication: Discussed with his son [name] is at bedside patient  Disposition Plan: SNF   Consultants:  None  Procedures:  None  Antimicrobials:  None  DVT prophylaxis: Subcu heparin   Objective: Vitals:   01/03/19 0720 01/03/19 1048 01/03/19 1158 01/03/19 1344  BP:  (!) 143/78 (!) 125/52   Pulse:  (!) 56 95   Resp:   (!) 24   Temp:   98 F (36.7 C)   TempSrc:   Oral   SpO2: 98%  100% 95%  Weight:      Height:        Intake/Output Summary (Last 24 hours) at 01/03/2019 1747 Last data filed at 01/03/2019 1541 Gross per 24 hour  Intake 240 ml  Output 725 ml  Net -485 ml   Filed Weights   01/02/19 0404 01/02/19 0722 01/03/19 0552  Weight: 83.7 kg 82.1 kg 82.1 kg   Body mass index is 28.35 kg/m.  Exam:  . General: 85 y.o. year-old male well developed well nourished in no acute  distress.  Alert and oriented x3.  Frail chronically ill looking but in good spirit . Cardiovascular: Regular rate and rhythm with no rubs or gallops.  No thyromegaly or JVD noted.   Marland Kitchen Respiratory: Clear to auscultation with no wheezes or rales. Good inspiratory effort. . Abdomen: Soft nontender nondistended with normal bowel sounds x4 quadrants. . Musculoskeletal: No lower extremity edema. 2/4 pulses in all 4 extremities. . Skin: No ulcerative lesions noted or rashes, .  Psychiatry: Mood is appropriate for condition and setting    Data Reviewed: CBC: Recent Labs  Lab 12/28/18 1940  WBC 6.5  NEUTROABS 5.0  HGB 10.8*  HCT 35.7*  MCV 98.3  PLT 413   Basic Metabolic Panel: Recent Labs  Lab 12/28/18 2348 12/30/18 0400 12/31/18 0356 01/01/19 0424 01/02/19 0416  NA 137 138 139 139 142  K 4.5 4.5 4.4 4.8 4.8  CL 101 102 103 105 107  CO2 24 25 26 26 26   GLUCOSE 142* 106* 121* 125* 118*  BUN 28* 33* 46* 50* 54*  CREATININE 1.53* 1.57* 2.02* 1.61* 1.67*  CALCIUM 8.5* 8.6* 8.6* 8.6* 8.8*  MG 2.1  --   --   --   --    GFR: Estimated Creatinine Clearance: 25.3 mL/min (A) (by C-G formula based on SCr of 1.67 mg/dL (H)). Liver Function Tests: Recent Labs  Lab 12/28/18 1940  AST 21  ALT 12  ALKPHOS 54  BILITOT 0.7  PROT 5.9*  ALBUMIN 2.2*   No results for input(s): LIPASE, AMYLASE in the last 168 hours. No results for input(s): AMMONIA in the last 168 hours. Coagulation Profile: No results for input(s): INR, PROTIME in the last 168 hours. Cardiac Enzymes: No results for input(s): CKTOTAL, CKMB, CKMBINDEX, TROPONINI in the last 168 hours. BNP (last 3 results) No results for input(s): PROBNP in the last 8760 hours. HbA1C: No results for input(s): HGBA1C in the last 72 hours. CBG: Recent Labs  Lab 01/02/19 1556 01/02/19 1950 01/03/19 0534 01/03/19 1153 01/03/19 1635  GLUCAP 128* 161* 101* 119* 132*   Lipid Profile: No results for input(s): CHOL, HDL, LDLCALC, TRIG, CHOLHDL, LDLDIRECT in the last 72 hours. Thyroid Function Tests: No results for input(s): TSH, T4TOTAL, FREET4, T3FREE, THYROIDAB in the last 72 hours. Anemia Panel: No results for input(s): VITAMINB12, FOLATE, FERRITIN, TIBC, IRON, RETICCTPCT in the last 72 hours. Urine analysis:    Component Value Date/Time   COLORURINE YELLOW 11/11/2018 1747   APPEARANCEUR CLEAR 11/11/2018 1747   LABSPEC 1.018 11/11/2018 1747   PHURINE 6.0 11/11/2018 1747   GLUCOSEU NEGATIVE  11/11/2018 1747   HGBUR NEGATIVE 11/11/2018 1747   BILIRUBINUR NEGATIVE 11/11/2018 1747   KETONESUR NEGATIVE 11/11/2018 1747   PROTEINUR 100 (A) 11/11/2018 1747   NITRITE NEGATIVE 11/11/2018 1747   LEUKOCYTESUR NEGATIVE 11/11/2018 1747   Sepsis Labs: @LABRCNTIP (procalcitonin:4,lacticidven:4)  ) Recent Results (from the past 240 hour(s))  Novel Coronavirus, NAA (Hosp order, Send-out to Ref Lab; TAT 18-24 hrs     Status: None   Collection Time: 12/28/18 10:23 PM   Specimen: Nasopharyngeal Swab; Respiratory  Result Value Ref Range Status   SARS-CoV-2, NAA NOT DETECTED NOT DETECTED Final    Comment: (NOTE) This nucleic acid amplification test was developed and its performance characteristics determined by [employer]. Nucleic acid amplification tests include PCR and TMA. This test has not been FDA cleared or approved. This test has been authorized by FDA under an Emergency Use Authorization (EUA). This test is only authorized for the duration  of time the declaration that circumstances exist justifying the authorization of the emergency use of in vitro diagnostic tests for detection of SARS-CoV-2 virus and/or diagnosis of COVID-19 infection under section 564(b)(1) of the Act, 21 U.S.C. 469GEX-5(M) (1), unless the authorization is terminated or revoked sooner. When diagnostic testing is negative, the possibility of a false negative result should be considered in the context of a patient's recent exposures and the presence of clinical signs and symptoms consistent with COVID-19. An individual without symptoms of COVID- 19 and who is not shedding SARS-CoV-2 vi rus would expect to have a negative (not detected) result in this assay. Performed At: [facility] [address] [place], [place] [id number] Rush Farmer MD UU:[id number]    [place]  Final    Comment: Performed at [facility], [place] [address], [place], [place] [address]       Studies: No results found.  Scheduled Meds: . brimonidine  1 drop Both Eyes TID  . cycloSPORINE  1 drop Both Eyes Q12H  . furosemide  20 mg Oral Daily  . heparin injection (subcutaneous)  5,000 Units Subcutaneous Q8H  . insulin aspart  0-9 Units Subcutaneous TID WC  . losartan  100 mg Oral Daily  . magnesium oxide  400 mg Oral Daily  . simvastatin  20 mg Oral QHS    Continuous Infusions:   LOS: 6 days     Cristal Deer, MD [facility]  To reach me or the doctor on call, go to: [email or web address] Password [facility]  01/03/2019, 5:47 PM

## 2019-01-03 NOTE — Progress Notes (Signed)
Patient sleeping during shift report.      

## 2019-01-03 NOTE — Progress Notes (Signed)
Patient's son, Chauncey Reading, visited today. Brought patient's clothing, was updated that there has been no progress in locating a bed for the patient.    Patient moved to chair, while son was at bedside.  Pt assisted back to bed by 3e staff.

## 2019-01-03 NOTE — TOC Progression Note (Signed)
Transition of Care [facility]) - Progression Note    Patient Details  Name: Lawrence Warner MRN: [id number] Date of Birth: 1918-08-11  Transition of Care [facility] CM/SW [place], LCSW Phone Number: [phone] 01/03/2019, 3:10 PM  Clinical Narrative:    CSW attempted to reach out to [place] at [facility] to ascertain if they have a bed available for patient and had to leave a message.  TOC team will continue to follow for discharge planning.   Expected Discharge Plan: [place] Barriers to Discharge: Continued Medical Work up  Expected Discharge Plan and Services Expected Discharge Plan: [place] Choice: [place] arrangements for the past 2 months: Independent Living Facility([place])                                       Social Determinants of Health (SDOH) Interventions    Readmission Risk Interventions Readmission Risk Prevention Plan 11/24/2018  Transportation Screening Complete  Home Care Screening Complete  Medication Review (RN CM) Complete  Some recent data might be hidden

## 2019-01-04 ENCOUNTER — Telehealth: Payer: Self-pay

## 2019-01-04 LAB — GLUCOSE, CAPILLARY
Glucose-Capillary: 102 mg/dL — ABNORMAL HIGH (ref 70–99)
Glucose-Capillary: 123 mg/dL — ABNORMAL HIGH (ref 70–99)
Glucose-Capillary: 131 mg/dL — ABNORMAL HIGH (ref 70–99)
Glucose-Capillary: 113 mg/dL — ABNORMAL HIGH (ref 70–99)

## 2019-01-04 NOTE — TOC Progression Note (Signed)
Transition of Care [facility]) - Progression Note    Patient Details  Name: Claiborne Stroble MRN: [id number] Date of Birth: August 29, 1918  Transition of Care [facility]) CM/SW [place], [place] Phone Number: 01/04/2019, 3:05 PM  Clinical Narrative:     [facility] does not have a bed available for the foreseeable future.    CSW called and spoke with the patient's daughter-in-law. She stated she did not know any nursing facilities in the area and asked the CSW to assist. CSW asked if she could fax the patient out and provide them with bed offers and quotes for pricing when the became available. She agreed to that and did not have any other questions or concerns.   CSW has faxed the patient out and will provide bed offers to the patient's family. CSW will continue to follow.     Expected Discharge Plan: [place] Barriers to Discharge: Continued Medical Work up  Expected Discharge Plan and Services Expected Discharge Plan: [place] Choice: [place] arrangements for the past 2 months: Independent Living Facility([place])                                       Social Determinants of Health (SDOH) Interventions    Readmission Risk Interventions Readmission Risk Prevention Plan 11/24/2018  Transportation Screening Complete  Home Care Screening Complete  Medication Review (RN CM) Complete  Some recent data might be hidden

## 2019-01-04 NOTE — Progress Notes (Signed)
PROGRESS NOTE  Lawrence Warner YTK:[id number] DOB: 12-Aug-1918 DOA: 12/28/2018 PCP: Clovia Cuff, MD  HPI/Recap of past 24 hours: Patient seen and examined at bedside he was helped to sit at the side of his bed to eat he is doing well he stated he is doing fine denies any new complaints  Subjective, January 03, 2019: Patient seen and examined he is sitting in a recliner.  Patient denies any complaints he is eating well his son [name] is at bedside  Subjective January 04, 2019: Patient seen and examined at bedside he is doing well he said he ate his breakfast good is sitting at the side of the bed.  Patient is awaiting nursing home placement  Assessment/Plan: Principal Problem:   Acute exacerbation of CHF (congestive heart failure) (HCC) Active Problems:   Diet-controlled diabetes mellitus (HCC)   Acute respiratory failure with hypoxia (HCC)   CKD (chronic kidney disease) stage 3, GFR 30-59 ml/min   QT prolongation  #1 acute exacerbation of chronic diastolic congestive heart failure continue to monitor renal function daily weights low-sodium diet and fluid restriction He was switched from IV Lasix to p.o. Lasix  2.  Acute elevated creatinine in the setting of chronic kidney disease that has resolved, he was switched from IV to p.o. to Lasix  3.  Acute kidney injury acute on chronic kidney disease stage III  4.  Acute on chronic respiratory hypoxic respiratory failure is on chronic O2 at 2-1/2 L/min at home  Code Status: DNR  Severity of Illness: The appropriate patient status for this patient is INPATIENT. Inpatient status is judged to be reasonable and necessary in order to provide the required intensity of service to ensure the patient's safety. The patient's presenting symptoms, physical exam findings, and initial radiographic and laboratory data in the context of their chronic comorbidities is felt to place them at high risk for further clinical deterioration. Furthermore, it is  not anticipated that the patient will be medically stable for discharge from the hospital within 2 midnights of admission. The following factors support the patient status of inpatient.   " The patient's presenting symptoms include QT prolongation. " The worrisome physical exam findings include QT prolongation. " The initial radiographic and laboratory data are worrisome because of CHF and elevated creatinine. " The chronic co-morbidities include diabetes mellitus ,chronic hypoxic respiratory failure .   * I certify that at the point of admission it is my clinical judgment that the patient will require inpatient hospital care spanning beyond 2 midnights from the point of admission due to high intensity of service, high risk for further deterioration and high frequency of surveillance required.*     Family Communication: Discussed with his son [name] is at bedside patient  Disposition Plan: SNF   Consultants:  None  Procedures:  None  Antimicrobials:  None  DVT prophylaxis: Subcu heparin   Objective: Vitals:   01/03/19 1945 01/04/19 0548 01/04/19 0549 01/04/19 1306  BP: (!) 133/57 (!) 144/100  (!) 102/46  Pulse: (!) 56  (!) 110 79  Resp: (!) 22  (!) 21   Temp: 97.9 F (36.6 C) 98.5 F (36.9 C)  97.6 F (36.4 C)  TempSrc: Oral Oral    SpO2: 96%  95% 97%  Weight:   81.7 kg   Height:        Intake/Output Summary (Last 24 hours) at 01/04/2019 1742 Last data filed at 01/04/2019 1300 Gross per 24 hour  Intake 1180 ml  Output 1045 ml  Net 135 ml   Filed Weights   01/02/19 0722 01/03/19 0552 01/04/19 0549  Weight: 82.1 kg 82.1 kg 81.7 kg   Body mass index is 28.21 kg/m.  Exam:  . General: 85 y.o. year-old male well developed well nourished in no acute distress.  Alert and oriented x3.  Frail chronically ill looking but in good spirit . Cardiovascular: Regular rate and rhythm with no rubs or gallops.  No thyromegaly or JVD noted.   Marland Kitchen Respiratory: Clear to  auscultation with no wheezes or rales. Good inspiratory effort. . Abdomen: Soft nontender nondistended with normal bowel sounds x4 quadrants. . Musculoskeletal: No lower extremity edema. 2/4 pulses in all 4 extremities. . Skin: No ulcerative lesions noted or rashes, . Psychiatry: Mood is appropriate for condition and setting    Data Reviewed: CBC: Recent Labs  Lab 12/28/18 1940  WBC 6.5  NEUTROABS 5.0  HGB 10.8*  HCT 35.7*  MCV 98.3  PLT 983   Basic Metabolic Panel: Recent Labs  Lab 12/28/18 2348 12/30/18 0400 12/31/18 0356 01/01/19 0424 01/02/19 0416  NA 137 138 139 139 142  K 4.5 4.5 4.4 4.8 4.8  CL 101 102 103 105 107  CO2 24 25 26 26 26   GLUCOSE 142* 106* 121* 125* 118*  BUN 28* 33* 46* 50* 54*  CREATININE 1.53* 1.57* 2.02* 1.61* 1.67*  CALCIUM 8.5* 8.6* 8.6* 8.6* 8.8*  MG 2.1  --   --   --   --    GFR: Estimated Creatinine Clearance: 25.3 mL/min (A) (by C-G formula based on SCr of 1.67 mg/dL (H)). Liver Function Tests: Recent Labs  Lab 12/28/18 1940  AST 21  ALT 12  ALKPHOS 54  BILITOT 0.7  PROT 5.9*  ALBUMIN 2.2*   No results for input(s): LIPASE, AMYLASE in the last 168 hours. No results for input(s): AMMONIA in the last 168 hours. Coagulation Profile: No results for input(s): INR, PROTIME in the last 168 hours. Cardiac Enzymes: No results for input(s): CKTOTAL, CKMB, CKMBINDEX, TROPONINI in the last 168 hours. BNP (last 3 results) No results for input(s): PROBNP in the last 8760 hours. HbA1C: No results for input(s): HGBA1C in the last 72 hours. CBG: Recent Labs  Lab 01/03/19 1635 01/03/19 2057 01/04/19 0547 01/04/19 1134 01/04/19 1637  GLUCAP 132* 120* 102* 123* 131*   Lipid Profile: No results for input(s): CHOL, HDL, LDLCALC, TRIG, CHOLHDL, LDLDIRECT in the last 72 hours. Thyroid Function Tests: No results for input(s): TSH, T4TOTAL, FREET4, T3FREE, THYROIDAB in the last 72 hours. Anemia Panel: No results for input(s): VITAMINB12,  FOLATE, FERRITIN, TIBC, IRON, RETICCTPCT in the last 72 hours. Urine analysis:    Component Value Date/Time   COLORURINE YELLOW 11/11/2018 1747   APPEARANCEUR CLEAR 11/11/2018 1747   LABSPEC 1.018 11/11/2018 1747   PHURINE 6.0 11/11/2018 1747   GLUCOSEU NEGATIVE 11/11/2018 1747   HGBUR NEGATIVE 11/11/2018 1747   BILIRUBINUR NEGATIVE 11/11/2018 1747   KETONESUR NEGATIVE 11/11/2018 1747   PROTEINUR 100 (A) 11/11/2018 1747   NITRITE NEGATIVE 11/11/2018 1747   LEUKOCYTESUR NEGATIVE 11/11/2018 1747   Sepsis Labs: @LABRCNTIP (procalcitonin:4,lacticidven:4)  ) Recent Results (from the past 240 hour(s))  Novel Coronavirus, NAA (Hosp order, Send-out to Ref Lab; TAT 18-24 hrs     Status: None   Collection Time: 12/28/18 10:23 PM   Specimen: Nasopharyngeal Swab; Respiratory  Result Value Ref Range Status   SARS-CoV-2, NAA NOT DETECTED NOT DETECTED Final    Comment: (NOTE) This nucleic acid amplification test was  developed and its performance characteristics determined by [employer]. Nucleic acid amplification tests include PCR and TMA. This test has not been FDA cleared or approved. This test has been authorized by FDA under an Emergency Use Authorization (EUA). This test is only authorized for the duration of time the declaration that circumstances exist justifying the authorization of the emergency use of in vitro diagnostic tests for detection of SARS-CoV-2 virus and/or diagnosis of COVID-19 infection under section 564(b)(1) of the Act, 21 U.S.C. 161WRU-0(A) (1), unless the authorization is terminated or revoked sooner. When diagnostic testing is negative, the possibility of a false negative result should be considered in the context of a patient's recent exposures and the presence of clinical signs and symptoms consistent with COVID-19. An individual without symptoms of COVID- 19 and who is not shedding SARS-CoV-2 vi rus would expect to have a negative (not detected)  result in this assay. Performed At: [facility] [address] [place], [place] [id number] Rush Farmer MD YN:[id number]    [place]  Final    Comment: Performed at [facility], [place] [address] [place], [place] [address]      Studies: No results found.  Scheduled Meds: . brimonidine  1 drop Both Eyes TID  . cycloSPORINE  1 drop Both Eyes Q12H  . furosemide  20 mg Oral Daily  . heparin injection (subcutaneous)  5,000 Units Subcutaneous Q8H  . insulin aspart  0-9 Units Subcutaneous TID WC  . losartan  100 mg Oral Daily  . magnesium oxide  400 mg Oral Daily  . simvastatin  20 mg Oral QHS    Continuous Infusions:   LOS: 7 days     Cristal Deer, MD [facility]  To reach me or the doctor on call, go to: [email or web address] Password [facility]  01/04/2019, 5:42 PM

## 2019-01-04 NOTE — Plan of Care (Signed)

## 2019-01-04 NOTE — Telephone Encounter (Signed)
Patient's relative [name] called to say patient is currently admitted at [facility] and she needs to cancel tomorrow's appointments. Appointments for 01/05/19 canceled per Eileen's request. [name] will call to reschedule missed appointments once patient is discharged/set up with rehab.

## 2019-01-04 NOTE — Progress Notes (Signed)
Physical Therapy Treatment Patient Details Name: Lawrence Warner MRN: [id number] DOB: Mar 09, 1918 Today's Date: 01/04/2019    History of Present Illness Pt is a 86 y.o. male admitted 12/28/18 with dyspnea of unknown duration; worked up for CHF excerbation. PMH includes CKD III-IV, DM, CHF, HTN, CAD, OA.    PT Comments    Pt fatigued today, but agreeable to get to chair and perform seated LE exercises.  He required therapeutic rest breaks to keep o2 >90% with exercises. Con't to recommend SNF.   Follow Up Recommendations  SNF;Supervision/Assistance - 24 hour(vs return to ILF with [facility] and 24/7)     Equipment Recommendations  None recommended by PT    Recommendations for Other Services       Precautions / Restrictions Precautions Precautions: Fall Restrictions Weight Bearing Restrictions: No    Mobility  Bed Mobility               General bed mobility comments: sitting EOB upon arrival  Transfers Overall transfer level: Needs assistance Equipment used: Rolling walker (2 wheeled) Transfers: Sit to/from [facility] Sit to Stand: Min assist Stand pivot transfers: Min guard       General transfer comment: MIN A to power up from bed with cues for hand placement, which he did not follow.. min/guard for SPT with use of RW  Ambulation/Gait             General Gait Details: Pt declined gait citing fatigue.   Stairs             Wheelchair Mobility    Modified Rankin (Stroke Patients Only)       Balance                                            Cognition Arousal/Alertness: Awake/alert Behavior During Therapy: WFL for tasks assessed/performed Overall Cognitive Status: Within Functional Limits for tasks assessed                                        Exercises General Exercises - Lower Extremity Ankle Circles/Pumps: AROM;Both;10 reps;Standing Long Arc Quad: Strengthening;Both;10 reps;Seated Hip  Flexion/Marching: Strengthening;Both;10 reps;Seated    General Comments        Pertinent Vitals/Pain Pain Assessment: Faces Faces Pain Scale: No hurt    Home Living                      Prior Function            PT Goals (current goals can now be found in the care plan section) Acute Rehab PT Goals Potential to Achieve Goals: Good Progress towards PT goals: Progressing toward goals    Frequency    Min 3X/week      PT Plan Current plan remains appropriate    Co-evaluation              AM-PAC PT "6 Clicks" Mobility   Outcome Measure  Help needed turning from your back to your side while in a flat bed without using bedrails?: A Little Help needed moving from lying on your back to sitting on the side of a flat bed without using bedrails?: A Little Help needed moving to and from a bed to a chair (including a wheelchair)?: A Little Help  needed standing up from a chair using your arms (e.g., wheelchair or bedside chair)?: A Little Help needed to walk in hospital room?: A Little Help needed climbing 3-5 steps with a railing? : A Lot 6 Click Score: 17    End of Session Equipment Utilized During Treatment: Oxygen Activity Tolerance: Patient limited by fatigue Patient left: in chair;with call bell/phone within reach;with chair alarm set Nurse Communication: Mobility status PT Visit Diagnosis: Other abnormalities of gait and mobility (R26.89);Muscle weakness (generalized) (M62.81)     Time: 0037-9444 PT Time Calculation (min) (ACUTE ONLY): 17 min  Charges:  $Therapeutic Exercise: 8-22 mins                     Karolyne Timmons L. Tamala Julian, [place] Pager [phone] 01/04/2019    Galen Manila 01/04/2019, 11:49 AM

## 2019-01-05 ENCOUNTER — Ambulatory Visit: Payer: Medicare Other | Admitting: Hematology

## 2019-01-05 ENCOUNTER — Ambulatory Visit: Payer: Medicare Other

## 2019-01-05 ENCOUNTER — Other Ambulatory Visit: Payer: Medicare Other

## 2019-01-05 LAB — GLUCOSE, CAPILLARY
Glucose-Capillary: 105 mg/dL — ABNORMAL HIGH (ref 70–99)
Glucose-Capillary: 134 mg/dL — ABNORMAL HIGH (ref 70–99)
Glucose-Capillary: 107 mg/dL — ABNORMAL HIGH (ref 70–99)
Glucose-Capillary: 126 mg/dL — ABNORMAL HIGH (ref 70–99)

## 2019-01-05 LAB — SARS CORONAVIRUS 2 (TAT 6-24 HRS): SARS Coronavirus 2: NEGATIVE

## 2019-01-05 NOTE — Progress Notes (Signed)
OT Cancellation Note  Patient Details Name: Lawrence Warner MRN: [id number] DOB: 1918/05/09   Cancelled Treatment:    Reason Eval/Treat Not Completed: Fatigue/lethargy limiting ability to participate.  Pt is back in bed after sitting up in chair and is too tired to participate in OT. Will check back another day.  Jerrell Hart 01/05/2019, 1:48 PM  Lesle Chris, OTR/L Acute Rehabilitation Services [phone] [facility] pager [phone] office 01/05/2019

## 2019-01-05 NOTE — Progress Notes (Signed)
PROGRESS NOTE  Lawrence Warner HGD:[id number] DOB: 13-May-1918 DOA: 12/28/2018 PCP: Clovia Cuff, MD  HPI/Recap of past 24 hours: Patient seen and examined at bedside he was helped to sit at the side of his bed to eat he is doing well he stated he is doing fine denies any new complaints  Subjective, January 03, 2019: Patient seen and examined he is sitting in a recliner.  Patient denies any complaints he is eating well his son [name] is at bedside  Subjective January 04, 2019: Patient seen and examined at bedside he is doing well he said he ate his breakfast good is sitting at the side of the bed.  Patient is awaiting nursing home placement  Subjective: January 05, 2019: Patient seen and examined at bedside.patient will go to [facility], will need updated COVID test prior to dc to [facility] if you can please order   Assessment/Plan: Principal Problem:   Acute exacerbation of CHF (congestive heart failure) (HCC) Active Problems:   Diet-controlled diabetes mellitus (HCC)   Acute respiratory failure with hypoxia (HCC)   CKD (chronic kidney disease) stage 3, GFR 30-59 ml/min   QT prolongation  #1 acute exacerbation of chronic diastolic congestive heart failure continue to monitor renal function daily weights low-sodium diet and fluid restriction He was switched from IV Lasix to p.o. Lasix  2.  Acute elevated creatinine in the setting of chronic kidney disease that has resolved, he was switched from IV to p.o. to Lasix  3.  Acute kidney injury acute on chronic kidney disease stage III  4.  Acute on chronic respiratory hypoxic respiratory failure is on chronic O2 at 2-1/2 L/min at home  Code Status: DNR  Severity of Illness: The appropriate patient status for this patient is INPATIENT. Inpatient status is judged to be reasonable and necessary in order to provide the required intensity of service to ensure the patient's safety. The patient's presenting symptoms, physical exam  findings, and initial radiographic and laboratory data in the context of their chronic comorbidities is felt to place them at high risk for further clinical deterioration. Furthermore, it is not anticipated that the patient will be medically stable for discharge from the hospital within 2 midnights of admission. The following factors support the patient status of inpatient.   " The patient's presenting symptoms include QT prolongation. " The worrisome physical exam findings include QT prolongation. " The initial radiographic and laboratory data are worrisome because of CHF and elevated creatinine. " The chronic co-morbidities include diabetes mellitus ,chronic hypoxic respiratory failure .   * I certify that at the point of admission it is my clinical judgment that the patient will require inpatient hospital care spanning beyond 2 midnights from the point of admission due to high intensity of service, high risk for further deterioration and high frequency of surveillance required.*      Family Communication:   Disposition Plan: For SNF COVID-19 ordered   Consultants:  None  Procedures:  None  Antimicrobials:  None  DVT prophylaxis: Subcu heparin   Objective: Vitals:   01/04/19 1954 01/05/19 0453 01/05/19 0645 01/05/19 0913  BP: (!) 147/41 (!) 158/60  124/61  Pulse: (!) 51 96    Resp: 16 20  20   Temp: 97.9 F (36.6 C) 97.7 F (36.5 C)  97.6 F (36.4 C)  TempSrc: Oral Oral  Oral  SpO2: 94% 96%  94%  Weight:   83.8 kg   Height:        Intake/Output Summary (Last 24  hours) at 01/05/2019 1003 Last data filed at 01/05/2019 0630 Gross per 24 hour  Intake 220 ml  Output 500 ml  Net -280 ml   Filed Weights   01/03/19 0552 01/04/19 0549 01/05/19 0645  Weight: 82.1 kg 81.7 kg 83.8 kg   Body mass index is 28.93 kg/m.  Exam:  . General: 85 y.o. year-old male well developed well nourished in no acute distress.  Alert and oriented x3.  Frail chronically ill looking  but in good spirit . Cardiovascular: Regular rate and rhythm with no rubs or gallops.  No thyromegaly or JVD noted.   Marland Kitchen Respiratory: Clear to auscultation with no wheezes or rales. Good inspiratory effort. . Abdomen: Soft nontender nondistended with normal bowel sounds x4 quadrants. . Musculoskeletal: No lower extremity edema. 2/4 pulses in all 4 extremities. . Skin: No ulcerative lesions noted or rashes, . Psychiatry: Mood is appropriate for condition and setting    Data Reviewed: CBC: No results for input(s): WBC, NEUTROABS, HGB, HCT, MCV, PLT in the last 168 hours. Basic Metabolic Panel: Recent Labs  Lab 12/30/18 0400 12/31/18 0356 01/01/19 0424 01/02/19 0416  NA 138 139 139 142  K 4.5 4.4 4.8 4.8  CL 102 103 105 107  CO2 25 26 26 26   GLUCOSE 106* 121* 125* 118*  BUN 33* 46* 50* 54*  CREATININE 1.57* 2.02* 1.61* 1.67*  CALCIUM 8.6* 8.6* 8.6* 8.8*   GFR: Estimated Creatinine Clearance: 25.6 mL/min (A) (by C-G formula based on SCr of 1.67 mg/dL (H)). Liver Function Tests: No results for input(s): AST, ALT, ALKPHOS, BILITOT, PROT, ALBUMIN in the last 168 hours. No results for input(s): LIPASE, AMYLASE in the last 168 hours. No results for input(s): AMMONIA in the last 168 hours. Coagulation Profile: No results for input(s): INR, PROTIME in the last 168 hours. Cardiac Enzymes: No results for input(s): CKTOTAL, CKMB, CKMBINDEX, TROPONINI in the last 168 hours. BNP (last 3 results) No results for input(s): PROBNP in the last 8760 hours. HbA1C: No results for input(s): HGBA1C in the last 72 hours. CBG: Recent Labs  Lab 01/04/19 0547 01/04/19 1134 01/04/19 1637 01/04/19 2053 01/05/19 0605  GLUCAP 102* 123* 131* 113* 105*   Lipid Profile: No results for input(s): CHOL, HDL, LDLCALC, TRIG, CHOLHDL, LDLDIRECT in the last 72 hours. Thyroid Function Tests: No results for input(s): TSH, T4TOTAL, FREET4, T3FREE, THYROIDAB in the last 72 hours. Anemia Panel: No results  for input(s): VITAMINB12, FOLATE, FERRITIN, TIBC, IRON, RETICCTPCT in the last 72 hours. Urine analysis:    Component Value Date/Time   COLORURINE YELLOW 11/11/2018 1747   APPEARANCEUR CLEAR 11/11/2018 1747   LABSPEC 1.018 11/11/2018 1747   PHURINE 6.0 11/11/2018 1747   GLUCOSEU NEGATIVE 11/11/2018 1747   HGBUR NEGATIVE 11/11/2018 1747   BILIRUBINUR NEGATIVE 11/11/2018 1747   KETONESUR NEGATIVE 11/11/2018 1747   PROTEINUR 100 (A) 11/11/2018 1747   NITRITE NEGATIVE 11/11/2018 1747   LEUKOCYTESUR NEGATIVE 11/11/2018 1747   Sepsis Labs: @LABRCNTIP (procalcitonin:4,lacticidven:4)  ) Recent Results (from the past 240 hour(s))  Novel Coronavirus, NAA (Hosp order, Send-out to Ref Lab; TAT 18-24 hrs     Status: None   Collection Time: 12/28/18 10:23 PM   Specimen: Nasopharyngeal Swab; Respiratory  Result Value Ref Range Status   SARS-CoV-2, NAA NOT DETECTED NOT DETECTED Final    Comment: (NOTE) This nucleic acid amplification test was developed and its performance characteristics determined by [employer]. Nucleic acid amplification tests include PCR and TMA. This test has not been FDA cleared  or approved. This test has been authorized by FDA under an Emergency Use Authorization (EUA). This test is only authorized for the duration of time the declaration that circumstances exist justifying the authorization of the emergency use of in vitro diagnostic tests for detection of SARS-CoV-2 virus and/or diagnosis of COVID-19 infection under section 564(b)(1) of the Act, 21 U.S.C. 449QPR-9(F) (1), unless the authorization is terminated or revoked sooner. When diagnostic testing is negative, the possibility of a false negative result should be considered in the context of a patient's recent exposures and the presence of clinical signs and symptoms consistent with COVID-19. An individual without symptoms of COVID- 19 and who is not shedding SARS-CoV-2 vi rus would expect to have a  negative (not detected) result in this assay. Performed At: [facility] [address] [place] [facility] [place], [place] [id number] Rush Farmer MD JT:[id number]    [place]  Final    Comment: Performed at [facility], [place] [address], [place], [place] [address]      Studies: No results found.  Scheduled Meds: . brimonidine  1 drop Both Eyes TID  . cycloSPORINE  1 drop Both Eyes Q12H  . furosemide  20 mg Oral Daily  . heparin injection (subcutaneous)  5,000 Units Subcutaneous Q8H  . insulin aspart  0-9 Units Subcutaneous TID WC  . losartan  100 mg Oral Daily  . magnesium oxide  400 mg Oral Daily  . simvastatin  20 mg Oral QHS    Continuous Infusions:   LOS: 8 days     Cristal Deer, MD [facility]  To reach me or the doctor on call, go to: [email or web address] Password [email or web address]  01/05/2019, 10:03 AM

## 2019-01-05 NOTE — TOC Progression Note (Signed)
Transition of Care [facility]) - Progression Note    Patient Details  Name: Lawrence Warner MRN: [id number] Date of Birth: 1918-10-24  Transition of Care [facility]) CM/SW [place], [place] Phone Number: [phone] 01/05/2019, 11:31 AM  Clinical Narrative:     CSW spoke with patient's daughter [name] to inform her of [facility] being able to do 14 day private pay room at rate of $4,970. She is agreeable and would like to accept bed offer.   CSW has informed MD of placement being secured and need for COVID test to be ordered prior to dc.   Expected Discharge Plan: [place] Barriers to Discharge: Continued Medical Work up  Expected Discharge Plan and Services Expected Discharge Plan: [place]: [place] arrangements for the past 2 months: Independent Living Facility([place])                                       Social Determinants of Health (SDOH) Interventions    Readmission Risk Interventions Readmission Risk Prevention Plan 11/24/2018  Transportation Screening Complete  Home Care Screening Complete  Medication Review (RN CM) Complete  Some recent data might be hidden

## 2019-01-05 NOTE — Care Management Important Message (Signed)
Important Message  Patient Details  Name: Lawrence Warner MRN: [id number] Date of Birth: Oct 31, 1918   Medicare Important Message Given:  Yes     Shelda Altes 01/05/2019, 1:19 PM

## 2019-01-06 LAB — GLUCOSE, CAPILLARY
Glucose-Capillary: 101 mg/dL — ABNORMAL HIGH (ref 70–99)
Glucose-Capillary: 139 mg/dL — ABNORMAL HIGH (ref 70–99)
Glucose-Capillary: 118 mg/dL — ABNORMAL HIGH (ref 70–99)

## 2019-01-06 MED ORDER — FUROSEMIDE 20 MG PO TABS: 40.0000 mg | ORAL_TABLET | Freq: Every day | ORAL | 1 refills | Status: DC

## 2019-01-06 NOTE — Progress Notes (Signed)
Physical Therapy Treatment Patient Details Name: Lawrence Warner MRN: [id number] DOB: 1918/03/24 Today's Date: 01/06/2019    History of Present Illness Pt is a 85 y.o. male admitted 12/28/18 with dyspnea of unknown duration; worked up for CHF excerbation. PMH includes CKD III-IV, DM, CHF, HTN, CAD, OA.    PT Comments    Patient seen for mobility progression. Pt requires min guard/min A for functional transfer training and bed mobility. Pt fatigued after transfer and declined attempting to ambulate. Pt with SOB and SpO2 90% on 2L O2 via [place].  Continue to progress as tolerated with anticipated d/c to SNF for further skilled PT services.     Follow Up Recommendations  SNF;Supervision/Assistance - 24 hour     Equipment Recommendations  None recommended by PT    Recommendations for Other Services       Precautions / Restrictions Precautions Precautions: Fall Precaution Comments: watch 02    Mobility  Bed Mobility Overal bed mobility: Needs Assistance Bed Mobility: Sit to Supine     Supine to sit: Min guard Sit to supine: Min assist   General bed mobility comments: assist required to bring bilat LE into bed then assistance to position once in bed  Transfers Overall transfer level: Needs assistance Equipment used: Rolling walker (2 wheeled) Transfers: Sit to/from Stand Sit to Stand: Min assist Stand pivot transfers: Min guard       General transfer comment: assist to power up into standing from recliner and min guard for safety for pivot to EOB  Ambulation/Gait             General Gait Details: pt declined due to fatigue after transfer to bed   Stairs             Wheelchair Mobility    Modified Rankin (Stroke Patients Only)       Balance Overall balance assessment: Needs assistance   Sitting balance-Leahy Scale: Good       Standing balance-Leahy Scale: Poor Standing balance comment: reliant on UE support                             Cognition Arousal/Alertness: Awake/alert Behavior During Therapy: WFL for tasks assessed/performed Overall Cognitive Status: Within Functional Limits for tasks assessed                                        Exercises      General Comments General comments (skin integrity, edema, etc.): pt SOB with mobility; SpO2 90% after transfer on 2L       Pertinent Vitals/Pain Pain Assessment: No/denies pain    Home Living                      Prior Function            PT Goals (current goals can now be found in the care plan section) Acute Rehab PT Goals Patient Stated Goal: none stated, agreeable to working with therapies and to get OOB Progress towards PT goals: Progressing toward goals    Frequency    Min 3X/week      PT Plan Current plan remains appropriate    Co-evaluation              AM-PAC PT "6 Clicks" Mobility   Outcome Measure  Help needed turning from your back to  your side while in a flat bed without using bedrails?: A Little Help needed moving from lying on your back to sitting on the side of a flat bed without using bedrails?: A Little Help needed moving to and from a bed to a chair (including a wheelchair)?: A Little Help needed standing up from a chair using your arms (e.g., wheelchair or bedside chair)?: A Little Help needed to walk in hospital room?: A Lot Help needed climbing 3-5 steps with a railing? : A Lot 6 Click Score: 16    End of Session Equipment Utilized During Treatment: Oxygen;Gait belt Activity Tolerance: Patient limited by fatigue Patient left: in bed;with call bell/phone within reach;with bed alarm set Nurse Communication: Mobility status PT Visit Diagnosis: Other abnormalities of gait and mobility (R26.89);Muscle weakness (generalized) (M62.81)     Time: 7616-0737 PT Time Calculation (min) (ACUTE ONLY): 10 min  Charges:  $Gait Training: 8-22 mins                     Earney Navy, PTA Acute  Rehabilitation Services Pager: [phone] Office: [phone]     Darliss Cheney 01/06/2019, 4:00 PM

## 2019-01-06 NOTE — Discharge Summary (Signed)
Physician Discharge Summary  Lawrence Warner OJJ:[id number] DOB: October 16, 1918 DOA: 12/28/2018  PCP: Clovia Cuff, MD  Admit date: 12/28/2018 Discharge date: 01/06/2019  Admitted From: Independent living Disposition:  SNF   Recommendations for Outpatient Follow-up:  1. Palliative care to follow at SNF-  2. F/u pulse ox when ambulating- wean O2 as able 3.  I recommend assessing if he is capable of remembering to take his own medications if a return to independent living is planned    Discharge Condition:  stable CODE STATUS:  DNR Diet recommendation:  Low sodium, heart healthy, diabetic diet Consultations:  none    Discharge Diagnoses:  Principal Problem:   Acute exacerbation of diastolic CHF (congestive heart failure)  Active Problems:   Acute on Chronic hypoxic respiratory failure   Diet-controlled diabetes mellitus (HCC)   CKD (chronic kidney disease) stage 3, GFR 30-59 ml/min      Brief Summary: Cherry Wittwer a 85 y.o.malewith medical history significant ofCKD stage 3, DM2, HTN, CAD s/p CABG, diastolic CHF. He is on 2 L of O2 at home for reasons unknown to me. He presents with dyspnea, orthopnea and increased O2 requirements (4 L in ED). He was admitted for acute dCHF.   CXR in ED >  Stable cardiomegaly with mild interstitial edema  He was admitted for acute CHF exacerbation on 10/1618, 11/22/18 and thus this is his 3rd admission in 3 months for the same. When he was last discharged, it was recommended he have a hospice eval.  I have become his attending today.   Hospital Course:  Acute on chronic diastolic CHF with acute on chronic hypoxic respiratory failure - he denied missing medications at home but per notes in computer, he has been forgetful at home - Last echo done 11/12/2018 showing normal LVEF of 50 to 55% and left ventricular diastolic parameters consistent with impaired relaxation. - has been diuresed with IV Lasix  - weight has noted to improve from  188 lb to 181 lb - He does not know why he is on Oxygen and I am unable to find the reason in the the chart- no emphysema or scarring noted on prior CT - pulse ox checked by myself today is 88-90 % on room air at rest -  Cont oupt O2  - I will increase his home dose of Lasix from 20 to 40 mg- -recommend SNF follows daily weights and exam and adjusts lasix if needed - cont Cozaar  CKD 3-4 -Cr rose from 1.50 to 2.02 on 11/5 possibly due to aggressive diuresis - improved when cutting Lasix back -  Follow as oupt   Mild elevation of Troponin - High sens trop > 23 and 27 - likely due to CHF  Diet controlled DM 2 - cont diabetic diet  Discharge Exam: Vitals:   01/06/19 0636 01/06/19 0818  BP: (!) 161/68 (!) 153/37  Pulse: (!) 50 (!) 56  Resp: (!) 22 20  Temp: (!) 97.5 F (36.4 C) 98.7 F (37.1 C)  SpO2: 95% 92%   Vitals:   01/05/19 1209 01/05/19 1943 01/06/19 0636 01/06/19 0818  BP: (!) 138/39 (!) 116/55 (!) 161/68 (!) 153/37  Pulse: 85 63 (!) 50 (!) 56  Resp: 20 (!) 22 (!) 22 20  Temp: 97.6 F (36.4 C) 98.3 F (36.8 C) (!) 97.5 F (36.4 C) 98.7 F (37.1 C)  TempSrc:  Oral Oral Oral  SpO2: 93% 93% 95% 92%  Weight:   82.2 kg   Height:  General: Pt is alert, awake, not in acute distress Cardiovascular: RRR, S1/S2 +, no rubs, no gallops Respiratory: CTA bilaterally, no wheezing, no rhonchi Abdominal: Soft, NT, ND, bowel sounds + Extremities: no edema, no cyanosis   Discharge Instructions  Discharge Instructions    Increase activity slowly   Complete by: As directed      Allergies as of 01/06/2019      Reactions   Alfuzosin Hcl Er Other (See Comments)   Confusion/Uroxatral   Imdur [isosorbide Dinitrate] Other (See Comments)   Unknown reaction   Penicillins Hives, Other (See Comments)   Has patient had a PCN reaction causing immediate rash, facial/tongue/throat swelling, SOB or lightheadedness with hypotension: Yes Has patient had a PCN reaction causing  severe rash involving mucus membranes or skin necrosis: Unknown Has patient had a PCN reaction that required hospitalization: Unknown Has patient had a PCN reaction occurring within the last 10 years: Unknown If all of the above answers are "NO", then may proceed with Cephalosporin use.   Tape Other (See Comments)   SKIN IS VERY THIN AND WILL TEAR EASILY      Medication List    TAKE these medications   acetaminophen 500 MG tablet Commonly known as: TYLENOL Take 500-1,000 mg by mouth every 6 (six) hours as needed for mild pain.   ARTIFICIAL TEARS PF OP Place 1 drop into both eyes 4 (four) times daily.   brimonidine 0.2 % ophthalmic solution Commonly known as: ALPHAGAN Place 1 drop into both eyes 3 (three) times daily.   cycloSPORINE 0.05 % ophthalmic emulsion Commonly known as: RESTASIS Place 1 drop into both eyes every 12 (twelve) hours.   furosemide 20 MG tablet Commonly known as: LASIX Take 2 tablets (40 mg total) by mouth daily. What changed: how much to take   losartan 100 MG tablet Commonly known as: COZAAR Take 100 mg by mouth daily.   magnesium oxide 400 MG tablet Commonly known as: MAG-OX Take 400 mg daily by mouth.   OXYGEN Inhale 2.5 L into the lungs continuous.   simvastatin 40 MG tablet Commonly known as: ZOCOR Take 20 mg by mouth at bedtime.       Allergies  Allergen Reactions  . Alfuzosin Hcl Er Other (See Comments)    Confusion/Uroxatral   . Imdur [Isosorbide Dinitrate] Other (See Comments)    Unknown reaction  . Penicillins Hives and Other (See Comments)    Has patient had a PCN reaction causing immediate rash, facial/tongue/throat swelling, SOB or lightheadedness with hypotension: Yes Has patient had a PCN reaction causing severe rash involving mucus membranes or skin necrosis: Unknown Has patient had a PCN reaction that required hospitalization: Unknown Has patient had a PCN reaction occurring within the last 10 years: Unknown If all of  the above answers are "NO", then may proceed with Cephalosporin use.   . Tape Other (See Comments)    SKIN IS VERY THIN AND WILL TEAR EASILY     Procedures/Studies:   Dg Chest Portable 1 View  Result Date: 12/28/2018 CLINICAL DATA:  Shortness of breath for 1 day EXAM: PORTABLE CHEST 1 VIEW COMPARISON:  11/22/2018 FINDINGS: Bilateral interstitial thickening with patchy alveolar airspace opacities. Trace left pleural effusion. No right pleural effusion. No pneumothorax. Stable cardiomegaly. Prior CABG. No acute osseous abnormality. IMPRESSION: Stable cardiomegaly with mild interstitial edema. Electronically Signed   By: Kathreen Devoid   On: 12/28/2018 19:33     The results of significant diagnostics from this hospitalization (including imaging, microbiology,  ancillary and laboratory) are listed below for reference.     Microbiology: Recent Results (from the past 240 hour(s))  Novel Coronavirus, NAA (Hosp order, Send-out to Ref Lab; TAT 18-24 hrs     Status: None   Collection Time: 12/28/18 10:23 PM   Specimen: Nasopharyngeal Swab; Respiratory  Result Value Ref Range Status   SARS-CoV-2, NAA NOT DETECTED NOT DETECTED Final    Comment: (NOTE) This nucleic acid amplification test was developed and its performance characteristics determined by [employer]. Nucleic acid amplification tests include PCR and TMA. This test has not been FDA cleared or approved. This test has been authorized by FDA under an Emergency Use Authorization (EUA). This test is only authorized for the duration of time the declaration that circumstances exist justifying the authorization of the emergency use of in vitro diagnostic tests for detection of SARS-CoV-2 virus and/or diagnosis of COVID-19 infection under section 564(b)(1) of the Act, 21 U.S.C. 673ALP-3(X) (1), unless the authorization is terminated or revoked sooner. When diagnostic testing is negative, the possibility of a false negative result  should be considered in the context of a patient's recent exposures and the presence of clinical signs and symptoms consistent with COVID-19. An individual without symptoms of COVID- 19 and who is not shedding SARS-CoV-2 vi rus would expect to have a negative (not detected) result in this assay. Performed At: [facility] [address] [place], [place] [id number] Rush Farmer MD HG:[id number]    [place]  Final    Comment: Performed at [facility], [place] [address], [place], [place] [address]  SARS CORONAVIRUS 2 (TAT 6-24 HRS) Nasopharyngeal Nasopharyngeal Swab     Status: None   Collection Time: 01/05/19  1:50 PM   Specimen: Nasopharyngeal Swab  Result Value Ref Range Status   SARS Coronavirus 2 NEGATIVE NEGATIVE Final    Comment: (NOTE) SARS-CoV-2 target nucleic acids are NOT DETECTED. The SARS-CoV-2 RNA is generally detectable in upper and lower respiratory specimens during the acute phase of infection. Negative results do not preclude SARS-CoV-2 infection, do not rule out co-infections with other pathogens, and should not be used as the sole basis for treatment or other patient management decisions. Negative results must be combined with clinical observations, patient history, and epidemiological information. The expected result is Negative. Fact Sheet for Patients: SugarRoll.be Fact Sheet for Healthcare Providers: https://www.woods-mathews.com/ This test is not yet approved or cleared by the [place] FDA and  has been authorized for detection and/or diagnosis of SARS-CoV-2 by FDA under an Emergency Use Authorization (EUA). This EUA will remain  in effect (meaning this test can be used) for the duration of the COVID-19 declaration under Section 56 4(b)(1) of the Act, 21 U.S.C. section 360bbb-3(b)(1), unless the authorization is terminated or revoked sooner. Performed at [place], [place] [address], [place], [place] [address]      Labs: BNP (last 3 results) Recent Labs    11/11/18 1821 11/22/18 0241 12/28/18 1940  BNP 454.9* 300.1* 211.9*   Basic Metabolic Panel: Recent Labs  Lab 12/31/18 0356 01/01/19 0424 01/02/19 0416  NA 139 139 142  K 4.4 4.8 4.8  CL 103 105 107  CO2 26 26 26   GLUCOSE 121* 125* 118*  BUN 46* 50* 54*  CREATININE 2.02* 1.61* 1.67*  CALCIUM 8.6* 8.6* 8.8*   Liver Function Tests: No results for input(s): AST, ALT, ALKPHOS, BILITOT, PROT, ALBUMIN in the last 168 hours. No results for input(s): LIPASE, AMYLASE in  the last 168 hours. No results for input(s): AMMONIA in the last 168 hours. CBC: No results for input(s): WBC, NEUTROABS, HGB, HCT, MCV, PLT in the last 168 hours. Cardiac Enzymes: No results for input(s): CKTOTAL, CKMB, CKMBINDEX, TROPONINI in the last 168 hours. BNP: Invalid input(s): POCBNP CBG: Recent Labs  Lab 01/05/19 0605 01/05/19 1122 01/05/19 1639 01/05/19 2147 01/06/19 0640  GLUCAP 105* 134* 107* 126* 101*   D-Dimer No results for input(s): DDIMER in the last 72 hours. Hgb A1c No results for input(s): HGBA1C in the last 72 hours. Lipid Profile No results for input(s): CHOL, HDL, LDLCALC, TRIG, CHOLHDL, LDLDIRECT in the last 72 hours. Thyroid function studies No results for input(s): TSH, T4TOTAL, T3FREE, THYROIDAB in the last 72 hours.  Invalid input(s): FREET3 Anemia work up No results for input(s): VITAMINB12, FOLATE, FERRITIN, TIBC, IRON, RETICCTPCT in the last 72 hours. Urinalysis    Component Value Date/Time   COLORURINE YELLOW 11/11/2018 1747   APPEARANCEUR CLEAR 11/11/2018 1747   LABSPEC 1.018 11/11/2018 1747   PHURINE 6.0 11/11/2018 1747   GLUCOSEU NEGATIVE 11/11/2018 1747   HGBUR NEGATIVE 11/11/2018 1747   BILIRUBINUR NEGATIVE 11/11/2018 1747   KETONESUR NEGATIVE 11/11/2018 1747   PROTEINUR 100 (A) 11/11/2018 1747   NITRITE NEGATIVE 11/11/2018 1747   LEUKOCYTESUR NEGATIVE  11/11/2018 1747   Sepsis Labs Invalid input(s): PROCALCITONIN,  WBC,  LACTICIDVEN Microbiology Recent Results (from the past 240 hour(s))  Novel Coronavirus, NAA (Hosp order, Send-out to Ref Lab; TAT 18-24 hrs     Status: None   Collection Time: 12/28/18 10:23 PM   Specimen: Nasopharyngeal Swab; Respiratory  Result Value Ref Range Status   SARS-CoV-2, NAA NOT DETECTED NOT DETECTED Final    Comment: (NOTE) This nucleic acid amplification test was developed and its performance characteristics determined by [employer] and [employer]. Nucleic acid amplification tests include PCR and TMA. This test has not been FDA cleared or approved. This test has been authorized by FDA under an Emergency Use Authorization (EUA). This test is only authorized for the duration of time the declaration that circumstances exist justifying the authorization of the emergency use of in vitro diagnostic tests for detection of SARS-CoV-2 virus and/or diagnosis of COVID-19 infection under section 564(b)(1) of the Act, 21 U.S.C. 147WGN-5(A) (1), unless the authorization is terminated or revoked sooner. When diagnostic testing is negative, the possibility of a false negative result should be considered in the context of a patient's recent exposures and the presence of clinical signs and symptoms consistent with COVID-19. An individual without symptoms of COVID- 19 and who is not shedding SARS-CoV-2 vi rus would expect to have a negative (not detected) result in this assay. Performed At: [facility] [address] [place], [place] [id number] Rush Farmer MD IO:[id number]    [place]  Final    Comment: Performed at [facility], [place] [address], [place], [place] [address]  SARS CORONAVIRUS 2 (TAT 6-24 HRS) Nasopharyngeal Nasopharyngeal Swab     Status: None   Collection Time: 01/05/19  1:50 PM   Specimen: Nasopharyngeal Swab  Result Value Ref Range Status   SARS Coronavirus 2  NEGATIVE NEGATIVE Final    Comment: (NOTE) SARS-CoV-2 target nucleic acids are NOT DETECTED. The SARS-CoV-2 RNA is generally detectable in upper and lower respiratory specimens during the acute phase of infection. Negative results do not preclude SARS-CoV-2 infection, do not rule out co-infections with other pathogens, and should not be used as the sole basis for treatment or other  patient management decisions. Negative results must be combined with clinical observations, patient history, and epidemiological information. The expected result is Negative. Fact Sheet for Patients: SugarRoll.be Fact Sheet for Healthcare Providers: https://www.woods-mathews.com/ This test is not yet approved or cleared by the [place] FDA and  has been authorized for detection and/or diagnosis of SARS-CoV-2 by FDA under an Emergency Use Authorization (EUA). This EUA will remain  in effect (meaning this test can be used) for the duration of the COVID-19 declaration under Section 56 4(b)(1) of the Act, 21 U.S.C. section 360bbb-3(b)(1), unless the authorization is terminated or revoked sooner. Performed at [facility], [place] [address], [place], [place] [address]      Time coordinating discharge in minutes: 64  SIGNED:   Debbe Odea, MD  [facility] 01/06/2019, 10:17 AM Pager   If 7PM-7AM, please contact night-coverage [email or web address] Password [email or web address]

## 2019-01-06 NOTE — Progress Notes (Signed)
Message left for [facility] RN return my call for report. Patient being discharged today to SNF report phone number [phone] Room [id number].

## 2019-01-06 NOTE — Plan of Care (Signed)
  Problem: Education: Goal: Knowledge of General Education information will improve Description Including pain rating scale, medication(s)/side effects and non-pharmacologic comfort measures Outcome: Progressing   

## 2019-01-06 NOTE — Progress Notes (Signed)
Report called to Adam,Rn for transfer to SNF. Await ambulance transport arrival. No distress noted or further changes noted.

## 2019-01-06 NOTE — Progress Notes (Signed)
Occupational Therapy Treatment Patient Details Name: Lawrence Warner MRN: [id number] DOB: 05-Apr-1918 Today's Date: 01/06/2019    History of present illness Pt is a 85 y.o. male admitted 12/28/18 with dyspnea of unknown duration; worked up for CHF excerbation. PMH includes CKD III-IV, DM, CHF, HTN, CAD, OA.   OT comments  Pt feeling much better. Talkative and immediately willing to get OOB and work with OT on ADL training and remained up in chair at end of session. Pt to d/c to SNF for further rehab prior to return his ILF.   Follow Up Recommendations  SNF;Supervision/Assistance - 24 hour    Equipment Recommendations  Other (comment)(defer to next venue)    Recommendations for Other Services      Precautions / Restrictions Precautions Precautions: Fall Precaution Comments: watch 02       Mobility Bed Mobility Overal bed mobility: Needs Assistance Bed Mobility: Supine to Sit     Supine to sit: Min guard     General bed mobility comments: extra time and use of rail  Transfers Overall transfer level: Needs assistance Equipment used: Rolling walker (2 wheeled) Transfers: Sit to/from Stand Sit to Stand: Min guard         General transfer comment: increased time, no physical assist required    Balance Overall balance assessment: Needs assistance   Sitting balance-Leahy Scale: Good       Standing balance-Leahy Scale: Poor Standing balance comment: reliant on UE support                           ADL either performed or assessed with clinical judgement   ADL Overall ADL's : Needs assistance/impaired     Grooming: Brushing hair;Sitting;Set up(shampoo cap)           Upper Body Dressing : Set up;Sitting Upper Body Dressing Details (indicate cue type and reason): front opening gown     Toilet Transfer: Min guard;RW   Toileting- Clothing Manipulation and Hygiene: Minimal assistance;Sit to/from stand               Vision        Perception     Praxis      Cognition Arousal/Alertness: Awake/alert Behavior During Therapy: WFL for tasks assessed/performed Overall Cognitive Status: Within Functional Limits for tasks assessed                                          Exercises     Shoulder Instructions       General Comments      Pertinent Vitals/ Pain       Pain Assessment: No/denies pain  Home Living                                          Prior Functioning/Environment              Frequency  Min 2X/week        Progress Toward Goals  OT Goals(current goals can now be found in the care plan section)  Progress towards OT goals: Progressing toward goals  Acute Rehab OT Goals Patient Stated Goal: none stated, agreeable to working with therapies and to get OOB OT Goal Formulation: With patient Time For Goal Achievement: 01/14/19 Potential to Achieve  Goals: Good  Plan Discharge plan remains appropriate    Co-evaluation                 AM-PAC OT "6 Clicks" Daily Activity     Outcome Measure   Help from another person eating meals?: None Help from another person taking care of personal grooming?: A Little Help from another person toileting, which includes using toliet, bedpan, or urinal?: A Little Help from another person bathing (including washing, rinsing, drying)?: A Lot Help from another person to put on and taking off regular upper body clothing?: A Little Help from another person to put on and taking off regular lower body clothing?: A Lot 6 Click Score: 17    End of Session Equipment Utilized During Treatment: Gait belt;Rolling walker;Oxygen  OT Visit Diagnosis: Unsteadiness on feet (R26.81);Muscle weakness (generalized) (M62.81)   Activity Tolerance Patient tolerated treatment well   Patient Left in chair;with call bell/phone within reach;with nursing/sitter in room   Nurse Communication Other (comment)(ok to give diet sprite)         Time: 2081-3887 OT Time Calculation (min): 32 min  Charges: OT General Charges $OT Visit: 1 Visit OT Treatments $Self Care/Home Management : 23-37 mins  Nestor Lewandowsky, OTR/L Acute Rehabilitation Services Pager: [phone] Office: [phone]   Malka So 01/06/2019, 2:26 PM

## 2019-01-06 NOTE — TOC Transition Note (Signed)
Transition of Care [facility]) - CM/SW Discharge Note   Patient Details  Name: Lawrence Warner MRN: [id number] Date of Birth: 05-Nov-1918  [facility] CM/SW Contact:  Alberteen Sam, LCSW Phone Number: 01/06/2019, 12:33 PM   Clinical Narrative:     Patient will DC to: [facility] Anticipated DC date: 01/06/2019 Family notified: [name] Transport [id number]  Per MD patient ready for DC to [facility] . RN, patient, patient's family, and facility notified of DC. Discharge Summary sent to facility. RN given number for report   [phone] Room 3238. DC packet on chart. Ambulance transport requested for patient.  CSW signing off.  [place], [place]   Final next level of care: Skilled Nursing Facility Barriers to Discharge: No Barriers Identified   Patient Goals and CMS Choice Patient states their goals for this hospitalization and ongoing recovery are:: to go to SNF CMS Medicare.gov Compare Post Acute Care list provided to:: Patient Represenative (must comment)(Eileen) Choice offered to / list presented to : Adult Children(Eileen)  Discharge Placement PASRR number recieved: 12/31/18            Patient [facility] at: [facility] Patient to be transferred to facility by: [facility] Name of family member notified: [name] Patient and family notified of of transfer: 01/06/19  Discharge Plan and Services     Post Acute Care Choice: [facility]                               Social Determinants of Health (SDOH) Interventions     Readmission Risk Interventions Readmission Risk Prevention Plan 11/24/2018  Transportation Screening Complete  Home Care Screening Complete  Medication Review (RN CM) Complete  Some recent data might be hidden

## 2019-01-07 ENCOUNTER — Ambulatory Visit: Payer: Medicare Other | Admitting: Cardiology

## 2019-01-07 DIAGNOSIS — N183 Chronic kidney disease, stage 3 unspecified: Secondary | ICD-10-CM | POA: Diagnosis not present

## 2019-01-07 DIAGNOSIS — I5033 Acute on chronic diastolic (congestive) heart failure: Secondary | ICD-10-CM | POA: Diagnosis not present

## 2019-01-07 DIAGNOSIS — E039 Hypothyroidism, unspecified: Secondary | ICD-10-CM | POA: Diagnosis not present

## 2019-01-07 DIAGNOSIS — E785 Hyperlipidemia, unspecified: Secondary | ICD-10-CM | POA: Diagnosis not present

## 2019-01-07 DIAGNOSIS — E559 Vitamin D deficiency, unspecified: Secondary | ICD-10-CM | POA: Diagnosis not present

## 2019-01-07 DIAGNOSIS — D649 Anemia, unspecified: Secondary | ICD-10-CM | POA: Diagnosis not present

## 2019-01-07 DIAGNOSIS — R6889 Other general symptoms and signs: Secondary | ICD-10-CM | POA: Diagnosis not present

## 2019-01-07 DIAGNOSIS — E119 Type 2 diabetes mellitus without complications: Secondary | ICD-10-CM | POA: Diagnosis not present

## 2019-01-07 DIAGNOSIS — J9622 Acute and chronic respiratory failure with hypercapnia: Secondary | ICD-10-CM | POA: Diagnosis not present

## 2019-01-08 ENCOUNTER — Non-Acute Institutional Stay: Payer: Medicare Other | Admitting: Licensed Clinical Social Worker

## 2019-01-08 DIAGNOSIS — J449 Chronic obstructive pulmonary disease, unspecified: Secondary | ICD-10-CM | POA: Diagnosis not present

## 2019-01-08 DIAGNOSIS — I5033 Acute on chronic diastolic (congestive) heart failure: Secondary | ICD-10-CM | POA: Diagnosis not present

## 2019-01-08 DIAGNOSIS — N183 Chronic kidney disease, stage 3 unspecified: Secondary | ICD-10-CM | POA: Diagnosis not present

## 2019-01-08 DIAGNOSIS — J9621 Acute and chronic respiratory failure with hypoxia: Secondary | ICD-10-CM | POA: Diagnosis not present

## 2019-01-08 DIAGNOSIS — Z515 Encounter for palliative care: Secondary | ICD-10-CM

## 2019-01-09 ENCOUNTER — Emergency Department (HOSPITAL_COMMUNITY): Payer: Medicare Other

## 2019-01-09 ENCOUNTER — Observation Stay (HOSPITAL_COMMUNITY)
Admission: EM | Admit: 2019-01-09 | Discharge: 2019-01-10 | Disposition: A | Discharge status: Skilled Nursing Facility | Payer: Medicare Other | Attending: Internal Medicine | Admitting: Internal Medicine

## 2019-01-09 ENCOUNTER — Other Ambulatory Visit: Payer: Self-pay

## 2019-01-09 ENCOUNTER — Encounter (HOSPITAL_COMMUNITY): Payer: Self-pay | Admitting: *Deleted

## 2019-01-09 DIAGNOSIS — I2510 Atherosclerotic heart disease of native coronary artery without angina pectoris: Secondary | ICD-10-CM | POA: Insufficient documentation

## 2019-01-09 DIAGNOSIS — I959 Hypotension, unspecified: Secondary | ICD-10-CM | POA: Diagnosis not present

## 2019-01-09 DIAGNOSIS — Z951 Presence of aortocoronary bypass graft: Secondary | ICD-10-CM | POA: Diagnosis not present

## 2019-01-09 DIAGNOSIS — N179 Acute kidney failure, unspecified: Secondary | ICD-10-CM | POA: Diagnosis not present

## 2019-01-09 DIAGNOSIS — R41841 Cognitive communication deficit: Secondary | ICD-10-CM | POA: Diagnosis not present

## 2019-01-09 DIAGNOSIS — N400 Enlarged prostate without lower urinary tract symptoms: Secondary | ICD-10-CM | POA: Insufficient documentation

## 2019-01-09 DIAGNOSIS — M1990 Unspecified osteoarthritis, unspecified site: Secondary | ICD-10-CM | POA: Insufficient documentation

## 2019-01-09 DIAGNOSIS — R0600 Dyspnea, unspecified: Secondary | ICD-10-CM | POA: Diagnosis present

## 2019-01-09 DIAGNOSIS — Z96641 Presence of right artificial hip joint: Secondary | ICD-10-CM | POA: Insufficient documentation

## 2019-01-09 DIAGNOSIS — I130 Hypertensive heart and chronic kidney disease with heart failure and stage 1 through stage 4 chronic kidney disease, or unspecified chronic kidney disease: Secondary | ICD-10-CM | POA: Diagnosis not present

## 2019-01-09 DIAGNOSIS — I440 Atrioventricular block, first degree: Secondary | ICD-10-CM | POA: Diagnosis not present

## 2019-01-09 DIAGNOSIS — Z888 Allergy status to other drugs, medicaments and biological substances status: Secondary | ICD-10-CM | POA: Diagnosis not present

## 2019-01-09 DIAGNOSIS — Z87891 Personal history of nicotine dependence: Secondary | ICD-10-CM | POA: Insufficient documentation

## 2019-01-09 DIAGNOSIS — Z79899 Other long term (current) drug therapy: Secondary | ICD-10-CM | POA: Insufficient documentation

## 2019-01-09 DIAGNOSIS — Z836 Family history of other diseases of the respiratory system: Secondary | ICD-10-CM | POA: Insufficient documentation

## 2019-01-09 DIAGNOSIS — J9621 Acute and chronic respiratory failure with hypoxia: Secondary | ICD-10-CM | POA: Diagnosis present

## 2019-01-09 DIAGNOSIS — R931 Abnormal findings on diagnostic imaging of heart and coronary circulation: Secondary | ICD-10-CM | POA: Diagnosis not present

## 2019-01-09 DIAGNOSIS — N184 Chronic kidney disease, stage 4 (severe): Secondary | ICD-10-CM | POA: Insufficient documentation

## 2019-01-09 DIAGNOSIS — I495 Sick sinus syndrome: Principal | ICD-10-CM | POA: Insufficient documentation

## 2019-01-09 DIAGNOSIS — D693 Immune thrombocytopenic purpura: Secondary | ICD-10-CM | POA: Diagnosis not present

## 2019-01-09 DIAGNOSIS — E119 Type 2 diabetes mellitus without complications: Secondary | ICD-10-CM

## 2019-01-09 DIAGNOSIS — R001 Bradycardia, unspecified: Secondary | ICD-10-CM | POA: Diagnosis not present

## 2019-01-09 DIAGNOSIS — I5032 Chronic diastolic (congestive) heart failure: Secondary | ICD-10-CM | POA: Diagnosis not present

## 2019-01-09 DIAGNOSIS — R531 Weakness: Secondary | ICD-10-CM | POA: Insufficient documentation

## 2019-01-09 DIAGNOSIS — J9811 Atelectasis: Secondary | ICD-10-CM | POA: Diagnosis not present

## 2019-01-09 DIAGNOSIS — I10 Essential (primary) hypertension: Secondary | ICD-10-CM | POA: Diagnosis present

## 2019-01-09 DIAGNOSIS — Z880 Allergy status to penicillin: Secondary | ICD-10-CM | POA: Diagnosis not present

## 2019-01-09 DIAGNOSIS — R0602 Shortness of breath: Secondary | ICD-10-CM | POA: Diagnosis not present

## 2019-01-09 DIAGNOSIS — J9601 Acute respiratory failure with hypoxia: Secondary | ICD-10-CM | POA: Diagnosis present

## 2019-01-09 DIAGNOSIS — J90 Pleural effusion, not elsewhere classified: Secondary | ICD-10-CM | POA: Diagnosis not present

## 2019-01-09 DIAGNOSIS — I447 Left bundle-branch block, unspecified: Secondary | ICD-10-CM | POA: Diagnosis not present

## 2019-01-09 DIAGNOSIS — Z809 Family history of malignant neoplasm, unspecified: Secondary | ICD-10-CM | POA: Insufficient documentation

## 2019-01-09 DIAGNOSIS — E1122 Type 2 diabetes mellitus with diabetic chronic kidney disease: Secondary | ICD-10-CM | POA: Insufficient documentation

## 2019-01-09 DIAGNOSIS — K219 Gastro-esophageal reflux disease without esophagitis: Secondary | ICD-10-CM | POA: Insufficient documentation

## 2019-01-09 DIAGNOSIS — R5381 Other malaise: Secondary | ICD-10-CM | POA: Insufficient documentation

## 2019-01-09 DIAGNOSIS — Z9049 Acquired absence of other specified parts of digestive tract: Secondary | ICD-10-CM | POA: Insufficient documentation

## 2019-01-09 DIAGNOSIS — M6281 Muscle weakness (generalized): Secondary | ICD-10-CM | POA: Diagnosis not present

## 2019-01-09 DIAGNOSIS — R278 Other lack of coordination: Secondary | ICD-10-CM | POA: Diagnosis not present

## 2019-01-09 DIAGNOSIS — N189 Chronic kidney disease, unspecified: Secondary | ICD-10-CM | POA: Diagnosis present

## 2019-01-09 DIAGNOSIS — Z20828 Contact with and (suspected) exposure to other viral communicable diseases: Secondary | ICD-10-CM | POA: Insufficient documentation

## 2019-01-09 DIAGNOSIS — R000 Tachycardia, unspecified: Secondary | ICD-10-CM | POA: Diagnosis not present

## 2019-01-09 DIAGNOSIS — Z794 Long term (current) use of insulin: Secondary | ICD-10-CM | POA: Insufficient documentation

## 2019-01-09 DIAGNOSIS — E1140 Type 2 diabetes mellitus with diabetic neuropathy, unspecified: Secondary | ICD-10-CM | POA: Diagnosis not present

## 2019-01-09 DIAGNOSIS — E785 Hyperlipidemia, unspecified: Secondary | ICD-10-CM | POA: Insufficient documentation

## 2019-01-09 DIAGNOSIS — R918 Other nonspecific abnormal finding of lung field: Secondary | ICD-10-CM | POA: Insufficient documentation

## 2019-01-09 LAB — COMPREHENSIVE METABOLIC PANEL
ALT: 17 U/L (ref 0–44)
AST: 24 U/L (ref 15–41)
Albumin: 2.8 g/dL — ABNORMAL LOW (ref 3.5–5.0)
Alkaline Phosphatase: 55 U/L (ref 38–126)
Anion gap: 13 (ref 5–15)
BUN: 70 mg/dL — ABNORMAL HIGH (ref 8–23)
CO2: 18 mmol/L — ABNORMAL LOW (ref 22–32)
Calcium: 9 mg/dL (ref 8.9–10.3)
Chloride: 108 mmol/L (ref 98–111)
Creatinine, Ser: 2.26 mg/dL — ABNORMAL HIGH (ref 0.61–1.24)
GFR calc Af Amer: 27 mL/min — ABNORMAL LOW (ref >60)
GFR calc non Af Amer: 23 mL/min — ABNORMAL LOW (ref >60)
Glucose, Bld: 158 mg/dL — ABNORMAL HIGH (ref 70–99)
Potassium: 5 mmol/L (ref 3.5–5.1)
Sodium: 139 mmol/L (ref 135–145)
Total Bilirubin: 0.7 mg/dL (ref 0.3–1.2)
Total Protein: 6.6 g/dL (ref 6.5–8.1)

## 2019-01-09 LAB — POCT I-STAT EG7
Acid-base deficit: 4 mmol/L — ABNORMAL HIGH (ref 0.0–2.0)
Bicarbonate: 20.6 mmol/L (ref 20.0–28.0)
Calcium, Ion: 1.21 mmol/L (ref 1.15–1.40)
HCT: 36 % — ABNORMAL LOW (ref 39.0–52.0)
Hemoglobin: 12.2 g/dL — ABNORMAL LOW (ref 13.0–17.0)
O2 Saturation: 72 %
Potassium: 4.6 mmol/L (ref 3.5–5.1)
Sodium: 142 mmol/L (ref 135–145)
TCO2: 22 mmol/L (ref 22–32)
pCO2, Ven: 34.7 mmHg — ABNORMAL LOW (ref 44.0–60.0)
pH, Ven: 7.382 (ref 7.250–7.430)
pO2, Ven: 38 mmHg (ref 32.0–45.0)

## 2019-01-09 LAB — CBC
HCT: 39.2 % (ref 39.0–52.0)
Hemoglobin: 12.2 g/dL — ABNORMAL LOW (ref 13.0–17.0)
MCH: 29.8 pg (ref 26.0–34.0)
MCHC: 31.1 g/dL (ref 30.0–36.0)
MCV: 95.6 fL (ref 80.0–100.0)
Platelets: 139 10*3/uL — ABNORMAL LOW (ref 150–400)
RBC: 4.1 MIL/uL — ABNORMAL LOW (ref 4.22–5.81)
RDW: 12.8 % (ref 11.5–15.5)
WBC: 8.7 10*3/uL (ref 4.0–10.5)
nRBC: 0 % (ref 0.0–0.2)

## 2019-01-09 LAB — TROPONIN I (HIGH SENSITIVITY): Troponin I (High Sensitivity): 28 ng/L — ABNORMAL HIGH (ref <18)

## 2019-01-09 LAB — LACTIC ACID, PLASMA: Lactic Acid, Venous: 2.2 mmol/L (ref 0.5–1.9)

## 2019-01-09 LAB — BRAIN NATRIURETIC PEPTIDE: B Natriuretic Peptide: 209.5 pg/mL — ABNORMAL HIGH (ref 0.0–100.0)

## 2019-01-09 NOTE — ED Notes (Signed)
Pt was repositioned and was made more comfortable by allocating pillows.

## 2019-01-09 NOTE — ED Triage Notes (Signed)
Pt arrived by EMS for bradycardia. Called by facility for weakness and low heart rate. EMS reported rhythm intermittently in afib. Pt reported sob, placed on 2L for comfort. Pulse 48 on EKG

## 2019-01-09 NOTE — ED Provider Notes (Signed)
[facility] Emergency Department Provider Note MRN:  [id number]  Arrival date & time: 01/09/19     Chief Complaint   Bradycardia   History of Present Illness   Lawrence Warner is a 85 y.o. year-old male with a history of CAD, diabetes, CHF presenting to the ED with chief complaint of bradycardia.  Patient experiencing general malaise, weakness, shortness of breath for the past 1 or 2 days.  Denies fever, no cough, intermittent sharp chest pain, not currently present.  Denies abdominal pain, no vomiting, no diarrhea.  Denies sick contacts.  Symptoms are moderate to severe, constant, no exacerbating or alleviating factors.  No change to medications recently.  Review of Systems  A complete 10 system review of systems was obtained and all systems are negative except as noted in the HPI and PMH.   Patient's Health History    Past Medical History:  Diagnosis Date  . BPH (benign prostatic hypertrophy)   . Chronic heart failure ([place])   . Chronic renal insufficiency   . Coronary artery disease    CABG in 2000  . Diabetes type 2, controlled ([place])    Diet-controlled  . Diabetic neuropathy ([place])   . Dyslipidemia   . Hypertension   . ITP (idiopathic thrombocytopenic purpura)   . Osteoarthritis   . Pneumonia     Past Surgical History:  Procedure Laterality Date  . CHOLECYSTECTOMY    . CORONARY ARTERY BYPASS GRAFT  2000  . Right hip replacement  2014  . TONSILLECTOMY      Family History  Problem Relation Age of Onset  . Lung disease Father   . Cancer Brother     Social History   Socioeconomic History  . Marital status: Widowed    Spouse name: Not on file  . Number of children: Not on file  . Years of education: Not on file  . Highest education level: Not on file  Occupational History  . Occupation: Teaching laboratory technician  . Financial resource strain: Not on file  . Food insecurity    Worry: Not on file    Inability: Not on file  .  Transportation needs    Medical: Not on file    Non-medical: Not on file  Tobacco Use  . Smoking status: Former Smoker    Packs/day: 2.00    Years: 25.00    Pack years: 50.00  . Smokeless tobacco: Never Used  Substance and Sexual Activity  . Alcohol use: No  . Drug use: No  . Sexual activity: Not Currently  Lifestyle  . Physical activity    Days per week: Not on file    Minutes per session: Not on file  . Stress: Not on file  Relationships  . Social Herbalist on phone: Not on file    Gets together: Not on file    Attends religious service: Not on file    Active member of club or organization: Not on file    Attends meetings of clubs or organizations: Not on file    Relationship status: Not on file  . Intimate partner violence    Fear of current or ex partner: Not on file    Emotionally abused: Not on file    Physically abused: Not on file    Forced sexual activity: Not on file  Other Topics Concern  . Not on file  Social History Narrative    Two children.       Physical Exam  Vital Signs and Nursing Notes reviewed Vitals:   01/09/19 2216 01/09/19 2217  BP:  101/72  Pulse: (!) 25   Resp: (!) 34 (!) 34  Temp:    SpO2: 94%     CONSTITUTIONAL: Chronically ill-appearing, NAD NEURO:  Alert and oriented x 3, no focal deficits EYES:  eyes equal and reactive ENT/NECK:  no LAD, no JVD CARDIO: Regular rate, well-perfused, normal S1 and S2 PULM: Decreased breath sounds bilateral bases GI/GU:  normal bowel sounds, non-distended, non-tender MSK/SPINE:  No gross deformities, no edema SKIN:  no rash, atraumatic PSYCH:  Appropriate speech and behavior  Diagnostic and Interventional Summary    EKG Interpretation  Date/Time:  Saturday January 09 2019 21:47:40 EST Ventricular Rate:  55 PR Interval:    QRS Duration: 160 QT Interval:  469 QTC Calculation: 449 R Axis:   -57 Text Interpretation: Sinus rhythm in a pattern of bigeminy Atrial premature complexes  Prolonged PR interval Probable left atrial enlargement Left bundle branch block Confirmed by Gerlene Fee [phone]) on 01/09/2019 9:50:20 PM      Labs Reviewed  CBC - Abnormal; Notable for the following components:      Result Value   RBC 4.10 (*)    Hemoglobin 12.2 (*)    Platelets 139 (*)    All other components within normal limits  COMPREHENSIVE METABOLIC PANEL - Abnormal; Notable for the following components:   CO2 18 (*)    Glucose, Bld 158 (*)    BUN 70 (*)    Creatinine, Ser 2.26 (*)    Albumin 2.8 (*)    GFR calc non Af Amer 23 (*)    GFR calc Af Amer 27 (*)    All other components within normal limits  LACTIC ACID, PLASMA - Abnormal; Notable for the following components:   Lactic Acid, Venous 2.2 (*)    All other components within normal limits  BRAIN NATRIURETIC PEPTIDE - Abnormal; Notable for the following components:   B Natriuretic Peptide 209.5 (*)    All other components within normal limits  POCT I-STAT EG7 - Abnormal; Notable for the following components:   pCO2, Ven 34.7 (*)    Acid-base deficit 4.0 (*)    HCT 36.0 (*)    Hemoglobin 12.2 (*)    All other components within normal limits  TROPONIN I (HIGH SENSITIVITY) - Abnormal; Notable for the following components:   Troponin I (High Sensitivity) 28 (*)    All other components within normal limits  SARS CORONAVIRUS 2 (TAT 6-24 HRS)  TROPONIN I (HIGH SENSITIVITY)    XR Chest Single View  Final Result      Medications - No data to display   Procedures  /  Critical Care Procedures  ED Course and Medical Decision Making  I have reviewed the triage vital signs and the nursing notes.  Pertinent labs & imaging results that were available during my care of the patient were reviewed by me and considered in my medical decision making (see below for details).     Suspect recurrence of CHF exacerbation, nothing to suggest DVT on exam, awaiting chest x-ray, labs.  Patient is requiring 3 L nasal cannula to  maintain saturations, normally uses oxygen as needed.  Anticipating admission.  Labs reveal AKI, question of CHF with cardiorenal component or majority renal dysfunction, admitted to hospital service for further care.    Barth Kirks. Sedonia Small, MD [place] [email or web address]  Final Clinical Impressions(s) / ED  Diagnoses     ICD-10-CM   1. AKI (acute kidney injury) ([place])  N17.9   2. SOB (shortness of breath)  R06.02 XR Chest Single View    XR Chest Single View    ED Discharge Orders    None       Discharge Instructions Discussed with and Provided to Patient:   Discharge Instructions   None       Maudie Flakes, MD 01/10/19 1454

## 2019-01-09 NOTE — ED Notes (Signed)
Patient daughter in [place] asking for a call for an update [phone]

## 2019-01-10 ENCOUNTER — Observation Stay (HOSPITAL_COMMUNITY): Payer: Medicare Other

## 2019-01-10 ENCOUNTER — Other Ambulatory Visit: Payer: Self-pay

## 2019-01-10 DIAGNOSIS — Z20828 Contact with and (suspected) exposure to other viral communicable diseases: Secondary | ICD-10-CM | POA: Diagnosis not present

## 2019-01-10 DIAGNOSIS — Z880 Allergy status to penicillin: Secondary | ICD-10-CM | POA: Diagnosis not present

## 2019-01-10 DIAGNOSIS — I5032 Chronic diastolic (congestive) heart failure: Secondary | ICD-10-CM | POA: Diagnosis not present

## 2019-01-10 DIAGNOSIS — M2550 Pain in unspecified joint: Secondary | ICD-10-CM | POA: Diagnosis not present

## 2019-01-10 DIAGNOSIS — R0600 Dyspnea, unspecified: Secondary | ICD-10-CM | POA: Diagnosis present

## 2019-01-10 DIAGNOSIS — R0602 Shortness of breath: Secondary | ICD-10-CM

## 2019-01-10 DIAGNOSIS — K219 Gastro-esophageal reflux disease without esophagitis: Secondary | ICD-10-CM | POA: Diagnosis not present

## 2019-01-10 DIAGNOSIS — I959 Hypotension, unspecified: Secondary | ICD-10-CM | POA: Diagnosis not present

## 2019-01-10 DIAGNOSIS — Z7401 Bed confinement status: Secondary | ICD-10-CM | POA: Diagnosis not present

## 2019-01-10 DIAGNOSIS — N400 Enlarged prostate without lower urinary tract symptoms: Secondary | ICD-10-CM | POA: Diagnosis not present

## 2019-01-10 DIAGNOSIS — I130 Hypertensive heart and chronic kidney disease with heart failure and stage 1 through stage 4 chronic kidney disease, or unspecified chronic kidney disease: Secondary | ICD-10-CM | POA: Diagnosis not present

## 2019-01-10 DIAGNOSIS — I495 Sick sinus syndrome: Secondary | ICD-10-CM | POA: Diagnosis not present

## 2019-01-10 DIAGNOSIS — J9601 Acute respiratory failure with hypoxia: Secondary | ICD-10-CM | POA: Diagnosis not present

## 2019-01-10 DIAGNOSIS — N184 Chronic kidney disease, stage 4 (severe): Secondary | ICD-10-CM | POA: Diagnosis not present

## 2019-01-10 DIAGNOSIS — Z888 Allergy status to other drugs, medicaments and biological substances status: Secondary | ICD-10-CM | POA: Diagnosis not present

## 2019-01-10 DIAGNOSIS — E1122 Type 2 diabetes mellitus with diabetic chronic kidney disease: Secondary | ICD-10-CM | POA: Diagnosis not present

## 2019-01-10 DIAGNOSIS — E1140 Type 2 diabetes mellitus with diabetic neuropathy, unspecified: Secondary | ICD-10-CM | POA: Diagnosis not present

## 2019-01-10 LAB — COMPREHENSIVE METABOLIC PANEL
ALT: 15 U/L (ref 0–44)
AST: 18 U/L (ref 15–41)
Albumin: 2.7 g/dL — ABNORMAL LOW (ref 3.5–5.0)
Alkaline Phosphatase: 54 U/L (ref 38–126)
Anion gap: 13 (ref 5–15)
BUN: 63 mg/dL — ABNORMAL HIGH (ref 8–23)
CO2: 22 mmol/L (ref 22–32)
Calcium: 9.1 mg/dL (ref 8.9–10.3)
Chloride: 108 mmol/L (ref 98–111)
Creatinine, Ser: 1.9 mg/dL — ABNORMAL HIGH (ref 0.61–1.24)
GFR calc Af Amer: 33 mL/min — ABNORMAL LOW (ref >60)
GFR calc non Af Amer: 29 mL/min — ABNORMAL LOW (ref >60)
Glucose, Bld: 118 mg/dL — ABNORMAL HIGH (ref 70–99)
Potassium: 4.4 mmol/L (ref 3.5–5.1)
Sodium: 143 mmol/L (ref 135–145)
Total Bilirubin: 0.6 mg/dL (ref 0.3–1.2)
Total Protein: 6.4 g/dL — ABNORMAL LOW (ref 6.5–8.1)

## 2019-01-10 LAB — CBC
HCT: 36 % — ABNORMAL LOW (ref 39.0–52.0)
Hemoglobin: 11.4 g/dL — ABNORMAL LOW (ref 13.0–17.0)
MCH: 29.6 pg (ref 26.0–34.0)
MCHC: 31.7 g/dL (ref 30.0–36.0)
MCV: 93.5 fL (ref 80.0–100.0)
Platelets: 129 10*3/uL — ABNORMAL LOW (ref 150–400)
RBC: 3.85 MIL/uL — ABNORMAL LOW (ref 4.22–5.81)
RDW: 13.1 % (ref 11.5–15.5)
WBC: 5.8 10*3/uL (ref 4.0–10.5)
nRBC: 0 % (ref 0.0–0.2)

## 2019-01-10 LAB — GLUCOSE, CAPILLARY
Glucose-Capillary: 118 mg/dL — ABNORMAL HIGH (ref 70–99)
Glucose-Capillary: 150 mg/dL — ABNORMAL HIGH (ref 70–99)

## 2019-01-10 LAB — TROPONIN I (HIGH SENSITIVITY): Troponin I (High Sensitivity): 30 ng/L — ABNORMAL HIGH (ref <18)

## 2019-01-10 LAB — SARS CORONAVIRUS 2 (TAT 6-24 HRS): SARS Coronavirus 2: NEGATIVE

## 2019-01-10 MED ORDER — SODIUM CHLORIDE 0.9% FLUSH: 3.0000 mL | INTRAVENOUS | Status: DC | PRN

## 2019-01-10 MED ORDER — SIMVASTATIN 20 MG PO TABS: 20.0000 mg | ORAL_TABLET | Freq: Every day | ORAL | Status: DC

## 2019-01-10 MED ORDER — ALPRAZOLAM 0.25 MG PO TABS: 0.2500 mg | ORAL_TABLET | Freq: Two times a day (BID) | ORAL | 0 refills | Status: AC | PRN

## 2019-01-10 MED ORDER — INSULIN ASPART 100 UNIT/ML ~~LOC~~ SOLN
0.0000 [IU] | Freq: Three times a day (TID) | SUBCUTANEOUS | Status: DC
  Administered 2019-01-10: 1 [IU] via SUBCUTANEOUS

## 2019-01-10 MED ORDER — TECHNETIUM TO 99M ALBUMIN AGGREGATED
1.6000 | Freq: Once | INTRAVENOUS | Status: AC | PRN
  Administered 2019-01-10: 1.6 via INTRAVENOUS

## 2019-01-10 MED ORDER — CYCLOSPORINE 0.05 % OP EMUL
1.0000 [drp] | Freq: Two times a day (BID) | OPHTHALMIC | Status: DC
  Administered 2019-01-10: 1 [drp] via OPHTHALMIC
  Filled 2019-01-10 (×2): qty 30

## 2019-01-10 MED ORDER — BRIMONIDINE TARTRATE 0.2 % OP SOLN
1.0000 [drp] | Freq: Three times a day (TID) | OPHTHALMIC | Status: DC
  Filled 2019-01-10: qty 5

## 2019-01-10 MED ORDER — ONDANSETRON HCL 4 MG/2ML IJ SOLN: 4.0000 mg | Freq: Four times a day (QID) | INTRAMUSCULAR | Status: DC | PRN

## 2019-01-10 MED ORDER — ONDANSETRON HCL 4 MG PO TABS: 4.0000 mg | ORAL_TABLET | Freq: Four times a day (QID) | ORAL | Status: DC | PRN

## 2019-01-10 MED ORDER — HEPARIN SODIUM (PORCINE) 5000 UNIT/ML IJ SOLN: 5000.0000 [IU] | Freq: Three times a day (TID) | INTRAMUSCULAR | Status: DC

## 2019-01-10 MED ORDER — SODIUM CHLORIDE 0.9% FLUSH
3.0000 mL | Freq: Two times a day (BID) | INTRAVENOUS | Status: DC
  Administered 2019-01-10: 3 mL via INTRAVENOUS

## 2019-01-10 MED ORDER — SODIUM CHLORIDE 0.9 % IV SOLN: 250.0000 mL | INTRAVENOUS | Status: DC | PRN

## 2019-01-10 MED ORDER — MAGNESIUM OXIDE 400 (241.3 MG) MG PO TABS
400.0000 mg | ORAL_TABLET | Freq: Every day | ORAL | Status: DC
  Administered 2019-01-10: 400 mg via ORAL
  Filled 2019-01-10: qty 1

## 2019-01-10 MED ORDER — ACETAMINOPHEN 500 MG PO TABS: 500.0000 mg | ORAL_TABLET | Freq: Four times a day (QID) | ORAL | Status: DC | PRN

## 2019-01-10 NOTE — ED Notes (Signed)
Breakfast Ordered 

## 2019-01-10 NOTE — TOC Transition Note (Signed)
Transition of Care [facility]) - CM/SW Discharge Note   Patient Details  Name: Lawrence Warner MRN: [id number] Date of Birth: 10/28/1918  Transition of Care [facility] CM/SW Contact:  Gelene Mink, [place] Phone Number: 01/10/2019, 12:41 PM   Clinical Narrative:     CSW called and spoke with Abigail Butts at [facility]. She is able to accept the patient back. Patient does not require an updated FL2 note and he was swabbed for COVID upon arrival to the ED.   Patient will DC to: [facility] Anticipated DC date: 01/10/2019 Family notified: Yes Transport by: Corey Harold   Per MD patient ready for DC to . RN, patient, patient's family, and facility notified of DC. Discharge Summary and FL2 sent to facility. RN to call report prior to discharge [phone]). The patient will report to room 3238.  DC packet on chart. Ambulance transport requested for patient.   CSW will sign off for now as social work intervention is no longer needed. Please consult [place] again if new needs arise.  Daimian Sudberry, LCSW-A [place]/Clinical Social Work Department Cell: [phone]   Final next level of care: [place] Barriers to Discharge: No Barriers Identified   Patient Goals and CMS Choice Patient states their goals for this hospitalization and ongoing recovery are:: No goal stated CMS Medicare.gov Compare Post Acute Care list provided to:: Other (Comment Required)(Patient to return back to his SNF) Choice offered to / list presented to : NA  Discharge Placement              Patient [facility] bed at: [facility] Patient to be transferred to facility by: [place] Name of family member notified: [name] Patient and family notified of of transfer: 01/10/19  Discharge Plan and Services                DME Arranged: N/A DME Agency: NA                  Social Determinants of Health ([place]) Interventions     Readmission Risk Interventions Readmission Risk Prevention Plan  11/24/2018  Transportation Screening Complete  Home Care Screening Complete  Medication Review (RN CM) Complete  Some recent data might be hidden

## 2019-01-10 NOTE — ED Notes (Signed)
Ordered a hospital bed per RN Kelly--Lucious Zou

## 2019-01-10 NOTE — H&P (Signed)
History and Physical   Lawrence Warner TXH:[id number] DOB: 1919-01-23 DOA: 01/09/2019  Referring MD/NP/PA: Dr. Sedonia Warner  PCP: Lawrence Low, MD   Outpatient Specialists: None  Patient coming from: Skilled nursing facility  Chief Complaint: Bradycardia  HPI: Lawrence Warner is a 85 y.o. male with medical history significant of coronary artery disease, diabetes, hypertension, diastolic dysfunction CHF, BPH presenting with weakness shortness of breath as well as bradycardia for the last 2 days.  He was brought in from skilled nursing facility.  No abdominal pain no nausea vomiting or diarrhea.  Patient has history of CHF but has worsening renal function at the moment.  No evidence of pulmonary edema or any signs of fluid overload.  No findings of worsening renal failure.  He is having significant shortness of breath currently on 3 L.  Usually on 2 to 2.5 L.  Patient is being admitted for work-up..  ED Course: Temperature 97 for blood pressure 86/53 but currently 140/70 pulse was 25 initially currently 107 respirate of 34 and oxygen sats initially 88% on room air now 96% on 3 L.  White count 8.7 hemoglobin 12.2 and platelets 139.  Sodium is 142 potassium 4.6 chloride 108 CO2 of 18 BUN 70 creatinine 2.26 and calcium 9.0.  Lactic acid is 2.2 and glucose 158.  Venous pH is 7.382.  Troponin initially 28 and now 30.  BNP 209.  Patient is being admitted with suspected acute on chronic renal failure with shortness of breath for full evaluation  Review of Systems: As per HPI otherwise 10 point review of systems negative.    Past Medical History:  Diagnosis Date  . BPH (benign prostatic hypertrophy)   . Chronic heart failure ([place])   . Chronic renal insufficiency   . Coronary artery disease    CABG in 2000  . Diabetes type 2, controlled ([place])    Diet-controlled  . Diabetic neuropathy ([place])   . Dyslipidemia   . Hypertension   . ITP (idiopathic thrombocytopenic purpura)   . Osteoarthritis   .  Pneumonia     Past Surgical History:  Procedure Laterality Date  . CHOLECYSTECTOMY    . CORONARY ARTERY BYPASS GRAFT  2000  . Right hip replacement  2014  . TONSILLECTOMY       reports that he has quit smoking. He has a 50.00 pack-year smoking history. He has never used smokeless tobacco. He reports that he does not drink alcohol or use drugs.  Allergies  Allergen Reactions  . Alfuzosin Hcl Er Other (See Comments)    Confusion/Uroxatral   . Imdur [Isosorbide Dinitrate] Other (See Comments)    Unknown reaction  . Penicillins Hives and Other (See Comments)    Has patient had a PCN reaction causing immediate rash, facial/tongue/throat swelling, SOB or lightheadedness with hypotension: Yes Has patient had a PCN reaction causing severe rash involving mucus membranes or skin necrosis: Unknown Has patient had a PCN reaction that required hospitalization: Unknown Has patient had a PCN reaction occurring within the last 10 years: Unknown If all of the above answers are "NO", then may proceed with Cephalosporin use.   . Tape Other (See Comments)    SKIN IS VERY THIN AND WILL TEAR EASILY    Family History  Problem Relation Age of Onset  . Lung disease Father   . Cancer Brother      Prior to Admission medications   Medication Sig Start Date End Date Taking? Authorizing Provider  acetaminophen (TYLENOL) 500 MG tablet Take 500-1,000 mg  by mouth every 6 (six) hours as needed for mild pain.    Yes [provider]  brimonidine (ALPHAGAN) 0.2 % ophthalmic solution Place 1 drop into both eyes 3 (three) times daily.   Yes [provider]  cycloSPORINE (RESTASIS) 0.05 % ophthalmic emulsion Place 1 drop into both eyes every 12 (twelve) hours.    Yes [provider]  Dextran 70-Hypromellose (ARTIFICIAL TEARS PF OP) Place 1 drop into both eyes 4 (four) times daily.    Yes [provider]  furosemide (LASIX) 40 MG tablet Take 40 mg by mouth.   Yes [provider]  losartan (COZAAR) 100 MG tablet Take 100 mg by mouth daily.   Yes [provider]  magnesium oxide (MAG-OX) 400 MG tablet Take 400 mg daily by mouth.   Yes [provider]  OXYGEN Inhale 2.5 L into the lungs continuous.   Yes [provider]  simvastatin (ZOCOR) 20 MG tablet Take 20 mg by mouth daily.   Yes [provider]    Physical Exam: Vitals:   01/10/19 0100 01/10/19 0115 01/10/19 0145 01/10/19 0200  BP: (!) 166/67 (!) 152/41 (!) 155/59 140/70  Pulse: (!) 42 (!) 103 (!) 107   Resp: (!) 21 (!) 23 (!) 23 (!) 23  Temp:      TempSrc:      SpO2: 96% 98% 96%       Constitutional: Frail, in mild distress Vitals:   01/10/19 0100 01/10/19 0115 01/10/19 0145 01/10/19 0200  BP: (!) 166/67 (!) 152/41 (!) 155/59 140/70  Pulse: (!) 42 (!) 103 (!) 107   Resp: (!) 21 (!) 23 (!) 23 (!) 23  Temp:      TempSrc:      SpO2: 96% 98% 96%    Eyes: PERRL, lids and conjunctivae normal ENMT: Mucous membranes are moist. Posterior pharynx clear of any exudate or lesions.Normal dentition.  Neck: normal, supple, no masses, no thyromegaly Respiratory: clear to auscultation bilaterally, no wheezing, no crackles.  Positive rales, normal respiratory effort. No accessory muscle use.  Cardiovascular: Bradycardic, no murmurs / rubs / gallops. No extremity edema. 2+ pedal pulses. No carotid bruits.  Abdomen: no tenderness, no masses palpated. No hepatosplenomegaly. Bowel sounds positive.  Musculoskeletal: no clubbing / cyanosis. No joint deformity upper and lower extremities. Good ROM, no contractures. Normal muscle tone.  Skin: no rashes, lesions, ulcers. No induration Neurologic: CN 2-12 grossly intact. Sensation intact, DTR normal. Strength 5/5 in all 4.  Psychiatric: Confused alert and oriented x 3. Normal mood.     Labs on Admission: I have personally reviewed following labs and imaging studies  CBC: Recent Labs  Lab 01/09/19 2200 01/09/19 2228   WBC 8.7  --   HGB 12.2* 12.2*  HCT 39.2 36.0*  MCV 95.6  --   PLT 139*  --    Basic Metabolic Panel: Recent Labs  Lab 01/09/19 2200 01/09/19 2228  NA 139 142  K 5.0 4.6  CL 108  --   CO2 18*  --   GLUCOSE 158*  --   BUN 70*  --   CREATININE 2.26*  --   CALCIUM 9.0  --    GFR: Estimated Creatinine Clearance: 18.7 mL/min (A) (by C-G formula based on SCr of 2.26 mg/dL (H)). Liver Function Tests: Recent Labs  Lab 01/09/19 2200  AST 24  ALT 17  ALKPHOS 55  BILITOT 0.7  PROT 6.6  ALBUMIN 2.8*   No results for input(s): LIPASE,  AMYLASE in the last 168 hours. No results for input(s): AMMONIA in the last 168 hours. Coagulation Profile: No results for input(s): INR, PROTIME in the last 168 hours. Cardiac Enzymes: No results for input(s): CKTOTAL, CKMB, CKMBINDEX, TROPONINI in the last 168 hours. BNP (last 3 results) No results for input(s): PROBNP in the last 8760 hours. HbA1C: No results for input(s): HGBA1C in the last 72 hours. CBG: Recent Labs  Lab 01/05/19 1639 01/05/19 2147 01/06/19 0640 01/06/19 1116 01/06/19 1640  GLUCAP 107* 126* 101* 139* 118*   Lipid Profile: No results for input(s): CHOL, HDL, LDLCALC, TRIG, CHOLHDL, LDLDIRECT in the last 72 hours. Thyroid Function Tests: No results for input(s): TSH, T4TOTAL, FREET4, T3FREE, THYROIDAB in the last 72 hours. Anemia Panel: No results for input(s): VITAMINB12, FOLATE, FERRITIN, TIBC, IRON, RETICCTPCT in the last 72 hours. Urine analysis:    Component Value Date/Time   COLORURINE YELLOW 11/11/2018 1747   APPEARANCEUR CLEAR 11/11/2018 1747   LABSPEC 1.018 11/11/2018 1747   PHURINE 6.0 11/11/2018 1747   GLUCOSEU NEGATIVE 11/11/2018 1747   HGBUR NEGATIVE 11/11/2018 1747   BILIRUBINUR NEGATIVE 11/11/2018 1747   KETONESUR NEGATIVE 11/11/2018 1747   PROTEINUR 100 (A) 11/11/2018 1747   NITRITE NEGATIVE 11/11/2018 1747   LEUKOCYTESUR NEGATIVE 11/11/2018 1747   Sepsis Labs:  @[email or web address] (procalcitonin:4,lacticidven:4) ) Recent Results (from the past 240 hour(s))  SARS CORONAVIRUS 2 (TAT 6-24 HRS) Nasopharyngeal Nasopharyngeal Swab     Status: None   Collection Time: 01/05/19  1:50 PM   Specimen: Nasopharyngeal Swab  Result Value Ref Range Status   SARS Coronavirus 2 NEGATIVE NEGATIVE Final    Comment: (NOTE) SARS-CoV-2 target nucleic acids are NOT DETECTED. The SARS-CoV-2 RNA is generally detectable in upper and lower respiratory specimens during the acute phase of infection. Negative results do not preclude SARS-CoV-2 infection, do not rule out co-infections with other pathogens, and should not be used as the sole basis for treatment or other patient management decisions. Negative results must be combined with clinical observations, patient history, and epidemiological information. The expected result is Negative. Fact Sheet for Patients: SugarRoll.be Fact Sheet for Healthcare Providers: https://www.woods-mathews.com/ This test is not yet approved or cleared by the [place] FDA and  has been authorized for detection and/or diagnosis of SARS-CoV-2 by FDA under an Emergency Use Authorization (EUA). This EUA will remain  in effect (meaning this test can be used) for the duration of the COVID-19 declaration under Section 56 4(b)(1) of the Act, 21 U.S.C. section 360bbb-3(b)(1), unless the authorization is terminated or revoked sooner. Performed at [facility], [place] [address], [place], [place] [address]   SARS CORONAVIRUS 2 (TAT 6-24 HRS) Nasopharyngeal Nasopharyngeal Swab     Status: None   Collection Time: 01/09/19 10:18 PM   Specimen: Nasopharyngeal Swab  Result Value Ref Range Status   SARS Coronavirus 2 NEGATIVE NEGATIVE Final    Comment: (NOTE) SARS-CoV-2 target nucleic acids are NOT DETECTED. The SARS-CoV-2 RNA is generally detectable in upper and lower respiratory specimens during the acute  phase of infection. Negative results do not preclude SARS-CoV-2 infection, do not rule out co-infections with other pathogens, and should not be used as the sole basis for treatment or other patient management decisions. Negative results must be combined with clinical observations, patient history, and epidemiological information. The expected result is Negative. Fact Sheet for Patients: SugarRoll.be Fact Sheet for Healthcare Providers: https://www.woods-mathews.com/ This test is not yet approved or cleared by the [place] FDA and  has  been authorized for detection and/or diagnosis of SARS-CoV-2 by FDA under an Emergency Use Authorization (EUA). This EUA will remain  in effect (meaning this test can be used) for the duration of the COVID-19 declaration under Section 56 4(b)(1) of the Act, 21 U.S.C. section 360bbb-3(b)(1), unless the authorization is terminated or revoked sooner. Performed at [facility], [place] [address] [place], [place] [address]      Radiological Exams on Admission: Xr Chest Single View  Result Date: 01/09/2019 CLINICAL DATA:  Bradycardia EXAM: PORTABLE CHEST 1 VIEW COMPARISON:  12/28/2018 FINDINGS: Cardiac shadow is stable. Postsurgical changes are again seen. The lungs are well aerated bilaterally. Stable left basilar atelectasis is seen. The previously seen interstitial edema has resolved. No sizable effusion is noted. IMPRESSION: Stable left basilar atelectasis.  No acute abnormality noted. Electronically Signed   By: Inez Catalina M.D.   On: 01/09/2019 22:32    EKG: Independently reviewed.  It shows sinus rhythm with bigeminy.  Atrial premature complexes with prolonged PR interval and left atrial enlargement and left bundle branch block.  Appears to be unchanged from previous  Assessment/Plan Principal Problem:   Shortness of breath Active Problems:   Idiopathic thrombocytopenic purpura (HCC)   Essential  hypertension   GERD (gastroesophageal reflux disease)   Acute respiratory failure with hypoxia (HCC)   CKD (chronic kidney disease) stage 3, GFR 30-59 ml/min   Type 2 diabetes mellitus (HCC)   Dyspnea and respiratory abnormalities     #1 shortness of breath: Patient has chronic CHF and deconditioning.  Chest x-ray shows no evidence of pulmonary edema.  No evidence of fluid overload.  Patient has shortness of breath at this point may be due to bronchospasm or pulmonary embolism.  Due to worsening renal function CTA is not likely.  We will get a nuclear medicine of VQ scan.  Admit the patient continue with oxygen.  I will still hold diuretics due to worsening renal failure.  Monitor patient and may repeat chest x-ray in the morning  #2  History of ITP: Platelets are still Warner.  Monitor with heparin on board.  #3 bradycardia: Patient appears to have some tachybradycardia phenomena.  He has tachycardia now but was bradycardic on arrival.  This may also be causing patient's respiratory symptoms and generalized weakness.  He is a DNR but we may consider cardiology consult for inputs.  #4 acute on chronic kidney disease: Worsening renal function probably from diuresis.  Hold diuretics at the moment.  Monitor daily weight.  #4 diabetes: Sliding scale insulin.  Continue to monitor  #5 essential hypertension: Blood pressure is stabilized now.  It was Warner in the beginning.  Resume home regimen when stable  #6 GERD: Continue PPIs   DVT prophylaxis: Heparin Code Status: DNR Family Communication: No family currently at bedside Disposition Plan: Back to skilled facility Consults called: None Admission status: Observation  Severity of Illness: The appropriate patient status for this patient is OBSERVATION. Observation status is judged to be reasonable and necessary in order to provide the required intensity of service to ensure the patient's safety. The patient's presenting symptoms, physical exam  findings, and initial radiographic and laboratory data in the context of their medical condition is felt to place them at decreased risk for further clinical deterioration. Furthermore, it is anticipated that the patient will be medically stable for discharge from the hospital within 2 midnights of admission. The following factors support the patient status of observation.   " The patient's presenting  symptoms include shortness of. " The physical exam findings include mild bradycardia. " The initial radiographic and laboratory data are no F pulmonary edema on x-ray.     Barbette Merino MD [facility] Pager [phone]  If 7PM-7AM, please contact night-coverage [email or web address] Password [facility]  01/10/2019, 4:28 AM

## 2019-01-10 NOTE — ED Notes (Signed)
ED TO INPATIENT HANDOFF REPORT  ED Nurse Name and Phone #: Nolene Bernheim Name/Age/Gender Lawrence Warner 85 y.o. male Room/Bed: 033C/033C  Code Status   Code Status: Prior  Home/SNF/Other Rehab Patient oriented to: self, place, time and situation Is this baseline? Yes   Triage Complete: Triage complete  Chief Complaint bradycardic rate 48  Triage Note Pt arrived by EMS for bradycardia. Called by facility for weakness and low heart rate. EMS reported rhythm intermittently in afib. Pt reported sob, placed on 2L for comfort. Pulse 48 on EKG     Allergies Allergies  Allergen Reactions  . Alfuzosin Hcl Er Other (See Comments)    Confusion/Uroxatral   . Imdur [Isosorbide Dinitrate] Other (See Comments)    Unknown reaction  . Penicillins Hives and Other (See Comments)    Has patient had a PCN reaction causing immediate rash, facial/tongue/throat swelling, SOB or lightheadedness with hypotension: Yes Has patient had a PCN reaction causing severe rash involving mucus membranes or skin necrosis: Unknown Has patient had a PCN reaction that required hospitalization: Unknown Has patient had a PCN reaction occurring within the last 10 years: Unknown If all of the above answers are "NO", then may proceed with Cephalosporin use.   . Tape Other (See Comments)    SKIN IS VERY THIN AND WILL TEAR EASILY    Level of Care/Admitting Diagnosis ED Disposition    ED Disposition Condition Comment   Admit  Hospital Area: [facility] [[id number]]  Level of Care: Telemetry Cardiac [[id number]]  I expect the patient will be discharged within 24 hours: Yes  LOW acuity---Tx typically complete <24 hrs---ACUTE conditions typically can be evaluated <24 hours---LABS likely to return to acceptable levels <24 hours---IS near functional baseline---EXPECTED to return to current living arrangement---NOT newly hypoxic: Meets criteria for 5C-Observation unit  Covid Evaluation: Asymptomatic Screening  Protocol (No Symptoms)  Diagnosis: Dyspnea and respiratory abnormalities [161096]  Admitting Physician: Elwyn Reach [[id number]]  Attending Physician: Elwyn Reach [[id number]]  PT Class (Do Not Modify): Observation [104]  PT Acc Code (Do Not Modify): Observation [10022]       B Medical/Surgery History Past Medical History:  Diagnosis Date  . BPH (benign prostatic hypertrophy)   . Chronic heart failure ([place])   . Chronic renal insufficiency   . Coronary artery disease    CABG in 2000  . Diabetes type 2, controlled ([place])    Diet-controlled  . Diabetic neuropathy ([place])   . Dyslipidemia   . Hypertension   . ITP (idiopathic thrombocytopenic purpura)   . Osteoarthritis   . Pneumonia    Past Surgical History:  Procedure Laterality Date  . CHOLECYSTECTOMY    . CORONARY ARTERY BYPASS GRAFT  2000  . Right hip replacement  2014  . TONSILLECTOMY       A IV Location/Drains/Wounds Patient Lines/Drains/Airways Status   Active Line/Drains/Airways    Name:   Placement date:   Placement time:   Site:   Days:   Peripheral IV 12/28/18 Anterior;Distal;Left;Upper Antecubital   12/28/18    1813    Antecubital   13   Peripheral IV 01/09/19 Anterior;Distal;Left Forearm   01/09/19    2138    Forearm   1          Intake/Output Last 24 hours No intake or output data in the 24 hours ending 01/10/19 0030  Labs/Imaging Results for orders placed or performed during the hospital encounter of 01/09/19 (from the past 48 hour(s))  CBC  Status: Abnormal   Collection Time: 01/09/19 10:00 PM  Result Value Ref Range   WBC 8.7 4.0 - 10.5 K/uL   RBC 4.10 (L) 4.22 - 5.81 MIL/uL   Hemoglobin 12.2 (L) 13.0 - 17.0 g/dL   HCT 39.2 39.0 - 52.0 %   MCV 95.6 80.0 - 100.0 fL   MCH 29.8 26.0 - 34.0 pg   MCHC 31.1 30.0 - 36.0 g/dL   RDW 12.8 11.5 - 15.5 %   Platelets 139 (L) 150 - 400 K/uL    Comment: REPEATED TO VERIFY   nRBC 0.0 0.0 - 0.2 %    Comment: Performed at [facility], [place]  [address], [place], [place] [address]  CMP     Status: Abnormal   Collection Time: 01/09/19 10:00 PM  Result Value Ref Range   Sodium 139 135 - 145 mmol/L   Potassium 5.0 3.5 - 5.1 mmol/L   Chloride 108 98 - 111 mmol/L   CO2 18 (L) 22 - 32 mmol/L   Glucose, Bld 158 (H) 70 - 99 mg/dL   BUN 70 (H) 8 - 23 mg/dL   Creatinine, Ser 2.26 (H) 0.61 - 1.24 mg/dL   Calcium 9.0 8.9 - 10.3 mg/dL   Total Protein 6.6 6.5 - 8.1 g/dL   Albumin 2.8 (L) 3.5 - 5.0 g/dL   AST 24 15 - 41 U/L   ALT 17 0 - 44 U/L   Alkaline Phosphatase 55 38 - 126 U/L   Total Bilirubin 0.7 0.3 - 1.2 mg/dL   GFR calc non Af Amer 23 (L) >60 mL/min   GFR calc Af Amer 27 (L) >60 mL/min   Anion gap 13 5 - 15    Comment: Performed at [facility] [address] [place], [place] [address]  Lactic acid     Status: Abnormal   Collection Time: 01/09/19 10:00 PM  Result Value Ref Range   Lactic Acid, Venous 2.2 (HH) 0.5 - 1.9 mmol/L    Comment: CRITICAL RESULT CALLED TO, READ BACK BY AND VERIFIED WITH: RN B OLDLAND @2240  01/09/19 BY S GEZAHEGN  Performed at [facility], [place] [address], [place], [place] [address]   BNP     Status: Abnormal   Collection Time: 01/09/19 10:00 PM  Result Value Ref Range   B Natriuretic Peptide 209.5 (H) 0.0 - 100.0 pg/mL    Comment: Performed at [facility] [address], [place], [place] [address]  Troponin     Status: Abnormal   Collection Time: 01/09/19 10:00 PM  Result Value Ref Range   Troponin I (High Sensitivity) 28 (H) <18 ng/L    Comment: (NOTE) Elevated high sensitivity troponin I (hsTnI) values and significant  changes across serial measurements may suggest ACS but many other  chronic and acute conditions are known to elevate hsTnI results.  Refer to the "Links" section for chest pain algorithms and additional  guidance. Performed at [facility], [place] [address], , [place] [address]   POCT I-Stat EG7     Status: Abnormal   Collection Time: 01/09/19  10:28 PM  Result Value Ref Range   pH, Ven 7.382 7.250 - 7.430   pCO2, Ven 34.7 (L) 44.0 - 60.0 mmHg   pO2, Ven 38.0 32.0 - 45.0 mmHg   Bicarbonate 20.6 20.0 - 28.0 mmol/L   TCO2 22 22 - 32 mmol/L   O2 Saturation 72.0 %   Acid-base deficit 4.0 (H) 0.0 -  2.0 mmol/L   Sodium 142 135 - 145 mmol/L   Potassium 4.6 3.5 - 5.1 mmol/L   Calcium, Ion 1.21 1.15 - 1.40 mmol/L   HCT 36.0 (L) 39.0 - 52.0 %   Hemoglobin 12.2 (L) 13.0 - 17.0 g/dL   Patient temperature HIDE    Sample type VENOUS    Comment NOTIFIED PHYSICIAN    Xr Chest Single View  Result Date: 01/09/2019 CLINICAL DATA:  Bradycardia EXAM: PORTABLE CHEST 1 VIEW COMPARISON:  12/28/2018 FINDINGS: Cardiac shadow is stable. Postsurgical changes are again seen. The lungs are well aerated bilaterally. Stable left basilar atelectasis is seen. The previously seen interstitial edema has resolved. No sizable effusion is noted. IMPRESSION: Stable left basilar atelectasis.  No acute abnormality noted. Electronically Signed   By: Inez Catalina M.D.   On: 01/09/2019 22:32    Pending Labs Unresulted Labs (From admission, onward)    Start     Ordered   01/09/19 2201  SARS CORONAVIRUS 2 (TAT 6-24 HRS) Nasopharyngeal Nasopharyngeal Swab  (Asymptomatic/Tier 2 Patients Labs)  Once,   STAT    Question Answer Comment  Is this test for diagnosis or screening Screening   Symptomatic for COVID-19 as defined by CDC No   Hospitalized for COVID-19 No   Admitted to ICU for COVID-19 No   Previously tested for COVID-19 Yes   Resident in a congregate (group) care setting Unknown   Employed in healthcare setting Unknown      01/09/19 2200   Signed and Held  CBC  (heparin)  Once,   R    Comments: Baseline for heparin therapy IF NOT ALREADY DRAWN.  Notify MD if PLT < 100 K.    Signed and Held   Signed and Held  Creatinine, serum  (heparin)  Once,   R    Comments: Baseline for heparin therapy IF NOT ALREADY DRAWN.    Signed and Held   Signed and Held   Comprehensive metabolic panel  Tomorrow morning,   R     Signed and Held   Signed and Held  CBC  Tomorrow morning,   R     Signed and Held          Vitals/Pain Today's Vitals   01/09/19 2200 01/09/19 2216 01/09/19 2217 01/09/19 2357  BP: 107/69  101/72 (!) 150/80  Pulse: (!) 41 (!) 25  62  Resp: (!) 29 (!) 34 (!) 34 20  Temp:      TempSrc:      SpO2: (!) 88% 94%  95%  PainSc:        Isolation Precautions No active isolations  Medications Medications - No data to display  Mobility non-ambulatory High fall risk   Focused Assessments Pulmonary Assessment Handoff:  Lung sounds:   O2 Device: Nasal Cannula O2 Flow Rate (L/min): 2 L/min      R Recommendations: See Admitting Provider Note  Report given to:   Additional Notes:

## 2019-01-10 NOTE — Discharge Summary (Addendum)
Physician Discharge Summary  Lawrence Warner UVO:[id number] DOB: August 05, 1918 DOA: 01/09/2019  PCP: Wenda Low, MD  Admit date: 01/09/2019 Discharge date: 01/10/2019  Time spent: 35 minutes  Recommendations for Outpatient Follow-up:  PCP in 1 week, continued follow-up with palliative care, goals of care is symptom and comfort focused care only this is been discussed and agreed with by his [name]  Discharge Diagnoses:  Tachybradycardia syndrome   Shortness of breath Active Problems:   Idiopathic thrombocytopenic purpura ([place])   Essential hypertension   GERD (gastroesophageal reflux disease)   Acute respiratory failure with hypoxia ([place])   CKD (chronic kidney disease) stage 4   Type 2 diabetes mellitus ([place])   Dyspnea and respiratory abnormalities   Discharge Condition: Stable  Diet recommendation: Low-sodium heart healthy  Filed Weights   01/10/19 0606  Weight: 80.5 kg    History of present illness:  HPI: Lawrence Warner is a 85 y.o. male with medical history significant of coronary artery disease, diabetes, hypertension, diastolic dysfunction CHF, BPH presenting with weakness shortness of breath as well as bradycardia for the last 2 days.  He was brought in from skilled nursing facility.  No abdominal pain no nausea vomiting or diarrhea.  Patient has history of CHF but has worsening renal function at the moment.  No evidence of pulmonary edema or any signs of fluid overload.  No findings of worsening renal failure.  He is having significant shortness of breath currently on 3 L.  Usually on 2 to 2.5 L.  Patient is being admitted for work-up.Marland Kitchen  Hospital Course:   #1. TachyBradycardia syndrome -Patient did have bradycardia in the nursing home and was sent to the hospital for evaluation, does have periods of tachycardia and bradycardia, suspect he has conduction system disease due to advanced age, did have a normal 2D echocardiogram 2 months ago, we do not believe  he is really having symptoms from this and regardless given advanced age of 69, no plans to pursue cardiac evaluation or pacemaker called and discussed with [name] who agrees with this. -Plan of care for this patient is comfort focused and symptom focused care and not aggressive hospital evaluation or management, son completely agrees with this -He is being followed by palliative care at the facility and needs more education to SNF staff regarding symptomatic and supportive care -Son believes he has significant anxiety which is contributing to his frequent hospitalizations as well and requests low-dose of an anxiolytic, I started him on low-dose Xanax twice daily as needed advised caution regarding side effects of drowsiness and even confusion, if this occurs-this will need to be discontinued  Chronic diastolic CHF Chronic kidney disease stage IV -Have a VQ scan ordered on admission which was unremarkable -Both these are stable, continued on Lasix, ARB discontinued   History of ITP: Platelets are still low.  Rest of his medical problems are stable discharged back to his facility  Discharge Exam: Vitals:   01/10/19 0606 01/10/19 0713  BP: (!) 135/58 (!) 183/38  Pulse: (!) 58   Resp: (!) 22 16  Temp: 97.8 F (36.6 C) (!) 97.4 F (36.3 C)  SpO2: 99% 99%    General: AAOx2 Cardiovascular: S1S2/RRR Respiratory: CTAB  Discharge Instructions   Discharge Instructions    Diet - low sodium heart healthy   Complete by: As directed    Diet - low sodium heart healthy   Complete by: As directed    Increase activity slowly   Complete by: As directed  Increase activity slowly   Complete by: As directed      Allergies as of 01/10/2019      Reactions   Alfuzosin Hcl Er Other (See Comments)   Confusion/Uroxatral   Imdur [isosorbide Dinitrate] Other (See Comments)   Unknown reaction   Penicillins Hives, Other (See Comments)   Has patient had a PCN reaction causing  immediate rash, facial/tongue/throat swelling, SOB or lightheadedness with hypotension: Yes Has patient had a PCN reaction causing severe rash involving mucus membranes or skin necrosis: Unknown Has patient had a PCN reaction that required hospitalization: Unknown Has patient had a PCN reaction occurring within the last 10 years: Unknown If all of the above answers are "NO", then may proceed with Cephalosporin use.   Tape Other (See Comments)   SKIN IS VERY THIN AND WILL TEAR EASILY      Medication List    STOP taking these medications   losartan 100 MG tablet Commonly known as: COZAAR     TAKE these medications   acetaminophen 500 MG tablet Commonly known as: TYLENOL Take 500-1,000 mg by mouth every 6 (six) hours as needed for mild pain.   ALPRAZolam 0.25 MG tablet Commonly known as: Xanax Take 1 tablet (0.25 mg total) by mouth 2 (two) times daily as needed for up to 15 days for anxiety.   ARTIFICIAL TEARS PF OP Place 1 drop into both eyes 4 (four) times daily.   brimonidine 0.2 % ophthalmic solution Commonly known as: ALPHAGAN Place 1 drop into both eyes 3 (three) times daily.   cycloSPORINE 0.05 % ophthalmic emulsion Commonly known as: RESTASIS Place 1 drop into both eyes every 12 (twelve) hours.   furosemide 40 MG tablet Commonly known as: LASIX Take 40 mg by mouth.   magnesium oxide 400 MG tablet Commonly known as: MAG-OX Take 400 mg daily by mouth.   OXYGEN Inhale 2.5 L into the lungs continuous.   simvastatin 20 MG tablet Commonly known as: ZOCOR Take 20 mg by mouth daily.      Allergies  Allergen Reactions  . Alfuzosin Hcl Er Other (See Comments)    Confusion/Uroxatral   . Imdur [Isosorbide Dinitrate] Other (See Comments)    Unknown reaction  . Penicillins Hives and Other (See Comments)    Has patient had a PCN reaction causing immediate rash, facial/tongue/throat swelling, SOB or lightheadedness with hypotension: Yes Has patient had a PCN  reaction causing severe rash involving mucus membranes or skin necrosis: Unknown Has patient had a PCN reaction that required hospitalization: Unknown Has patient had a PCN reaction occurring within the last 10 years: Unknown If all of the above answers are "NO", then may proceed with Cephalosporin use.   . Tape Other (See Comments)    SKIN IS VERY THIN AND WILL TEAR EASILY      The results of significant diagnostics from this hospitalization (including imaging, microbiology, ancillary and laboratory) are listed below for reference.    Significant Diagnostic Studies: Nm Pulmonary Perfusion  Result Date: 01/10/2019 CLINICAL DATA:  Worsening shortness of breath for the past 2 days. Evaluate for pulmonary embolism. EXAM: NUCLEAR MEDICINE PERFUSION LUNG SCAN TECHNIQUE: Perfusion images were obtained in multiple projections after intravenous injection of radiopharmaceutical. Ventilation scans were not obtained secondary to patient's inability to tolerate further imaging. RADIOPHARMACEUTICALS:  1.6 mCi Tc-3m MAA IV COMPARISON:  Chest radiograph-01/09/2019; 12/28/2018; nuclear medicine V/Q scan-11/12/2018; chest CT-05/18/2017 FINDINGS: Review of chest radiograph demonstrates enlarged cardiac silhouette and mediastinal contours. Sequela previous median sternotomy. Improved  aeration of the lungs with minimal perihilar and bibasilar opacities, left greater than right. Trace bilateral effusions. No pneumothorax. Perfusion images: There is relative homogeneous distribution of injected radiotracer throughout the bilateral pulmonary parenchyma without discrete segmental filling defect to suggest pulmonary embolism. IMPRESSION: 1. Pulmonary embolism absent (very low probability of pulmonary embolism), unchanged compared to the 11/12/2018 examination. 2. Ventilatory images were not obtained secondary to patient's inability to tolerate further imaging. Electronically Signed   By: Sandi Mariscal M.D.   On: 01/10/2019  09:59   Xr Chest Single View  Result Date: 01/09/2019 CLINICAL DATA:  Bradycardia EXAM: PORTABLE CHEST 1 VIEW COMPARISON:  12/28/2018 FINDINGS: Cardiac shadow is stable. Postsurgical changes are again seen. The lungs are well aerated bilaterally. Stable left basilar atelectasis is seen. The previously seen interstitial edema has resolved. No sizable effusion is noted. IMPRESSION: Stable left basilar atelectasis.  No acute abnormality noted. Electronically Signed   By: Inez Catalina M.D.   On: 01/09/2019 22:32   Dg Chest Portable 1 View  Result Date: 12/28/2018 CLINICAL DATA:  Shortness of breath for 1 day EXAM: PORTABLE CHEST 1 VIEW COMPARISON:  11/22/2018 FINDINGS: Bilateral interstitial thickening with patchy alveolar airspace opacities. Trace left pleural effusion. No right pleural effusion. No pneumothorax. Stable cardiomegaly. Prior CABG. No acute osseous abnormality. IMPRESSION: Stable cardiomegaly with mild interstitial edema. Electronically Signed   By: Kathreen Devoid   On: 12/28/2018 19:33    Microbiology: Recent Results (from the past 240 hour(s))  SARS CORONAVIRUS 2 (TAT 6-24 HRS) Nasopharyngeal Nasopharyngeal Swab     Status: None   Collection Time: 01/05/19  1:50 PM   Specimen: Nasopharyngeal Swab  Result Value Ref Range Status   SARS Coronavirus 2 NEGATIVE NEGATIVE Final    Comment: (NOTE) SARS-CoV-2 target nucleic acids are NOT DETECTED. The SARS-CoV-2 RNA is generally detectable in upper and lower respiratory specimens during the acute phase of infection. Negative results do not preclude SARS-CoV-2 infection, do not rule out co-infections with other pathogens, and should not be used as the sole basis for treatment or other patient management decisions. Negative results must be combined with clinical observations, patient history, and epidemiological information. The expected result is Negative. Fact Sheet for Patients: SugarRoll.be Fact Sheet  for Healthcare Providers: https://www.woods-mathews.com/ This test is not yet approved or cleared by the [place] FDA and  has been authorized for detection and/or diagnosis of SARS-CoV-2 by FDA under an Emergency Use Authorization (EUA). This EUA will remain  in effect (meaning this test can be used) for the duration of the COVID-19 declaration under Section 56 4(b)(1) of the Act, 21 U.S.C. section 360bbb-3(b)(1), unless the authorization is terminated or revoked sooner. Performed at [facility], [place] [address] [place], [place] [address]   SARS CORONAVIRUS 2 (TAT 6-24 HRS) Nasopharyngeal Nasopharyngeal Swab     Status: None   Collection Time: 01/09/19 10:18 PM   Specimen: Nasopharyngeal Swab  Result Value Ref Range Status   SARS Coronavirus 2 NEGATIVE NEGATIVE Final    Comment: (NOTE) SARS-CoV-2 target nucleic acids are NOT DETECTED. The SARS-CoV-2 RNA is generally detectable in upper and lower respiratory specimens during the acute phase of infection. Negative results do not preclude SARS-CoV-2 infection, do not rule out co-infections with other pathogens, and should not be used as the sole basis for treatment or other patient management decisions. Negative results must be combined with clinical observations, patient history, and epidemiological information. The expected result is Negative. Fact Sheet for Patients: SugarRoll.be  Fact Sheet for Healthcare Providers: https://www.woods-mathews.com/ This test is not yet approved or cleared by the [place] FDA and  has been authorized for detection and/or diagnosis of SARS-CoV-2 by FDA under an Emergency Use Authorization (EUA). This EUA will remain  in effect (meaning this test can be used) for the duration of the COVID-19 declaration under Section 56 4(b)(1) of the Act, 21 U.S.C. section 360bbb-3(b)(1), unless the authorization is terminated or revoked  sooner. Performed at [facility], [place] [address], [place], [place] [address]      Labs: Basic Metabolic Panel: Recent Labs  Lab 01/09/19 2200 01/09/19 2228 01/10/19 0616  NA 139 142 143  K 5.0 4.6 4.4  CL 108  --  108  CO2 18*  --  22  GLUCOSE 158*  --  118*  BUN 70*  --  63*  CREATININE 2.26*  --  1.90*  CALCIUM 9.0  --  9.1   Liver Function Tests: Recent Labs  Lab 01/09/19 2200 01/10/19 0616  AST 24 18  ALT 17 15  ALKPHOS 55 54  BILITOT 0.7 0.6  PROT 6.6 6.4*  ALBUMIN 2.8* 2.7*   No results for input(s): LIPASE, AMYLASE in the last 168 hours. No results for input(s): AMMONIA in the last 168 hours. CBC: Recent Labs  Lab 01/09/19 2200 01/09/19 2228 01/10/19 0616  WBC 8.7  --  5.8  HGB 12.2* 12.2* 11.4*  HCT 39.2 36.0* 36.0*  MCV 95.6  --  93.5  PLT 139*  --  129*   Cardiac Enzymes: No results for input(s): CKTOTAL, CKMB, CKMBINDEX, TROPONINI in the last 168 hours. BNP: BNP (last 3 results) Recent Labs    11/22/18 0241 12/28/18 1940 01/09/19 2200  BNP 300.1* 393.2* 209.5*    ProBNP (last 3 results) No results for input(s): PROBNP in the last 8760 hours.  CBG: Recent Labs  Lab 01/05/19 2147 01/06/19 0640 01/06/19 1116 01/06/19 1640 01/10/19 0832  GLUCAP 126* 101* 139* 118* 118*       Signed:  Domenic Polite MD.  [facility] 01/10/2019, 10:49 AM

## 2019-01-11 ENCOUNTER — Other Ambulatory Visit: Payer: Self-pay

## 2019-01-11 DIAGNOSIS — R0789 Other chest pain: Secondary | ICD-10-CM | POA: Diagnosis not present

## 2019-01-11 DIAGNOSIS — I495 Sick sinus syndrome: Secondary | ICD-10-CM | POA: Diagnosis not present

## 2019-01-11 DIAGNOSIS — I5033 Acute on chronic diastolic (congestive) heart failure: Secondary | ICD-10-CM | POA: Diagnosis not present

## 2019-01-11 DIAGNOSIS — N178 Other acute kidney failure: Secondary | ICD-10-CM | POA: Diagnosis not present

## 2019-01-11 NOTE — Progress Notes (Signed)
COMMUNITY PALLIATIVE CARE SW NOTE  PATIENT NAME: Lawrence Warner DOB: 05/30/1918 MRN: [id number]  PRIMARY CARE PROVIDER: Wenda Low, MD  RESPONSIBLE PARTY:  Acct ID - Guarantor Home Phone Work Phone Relationship Acct Type  [id number] - Cav[place],[id number]  Self P/F     [place], [place], [place], [place] [address]   Due to the COVID-19 crisis, this virtual check-in visit was done via telephone from my office and it was initiated and consent given by thispatientand or family.  PLAN OF CARE and INTERVENTIONS:             1. GOALS OF CARE/ ADVANCE CARE PLANNING:  The goal of patient's son, Lawrence Warner, is for patient to return to [place].  He has a DNR. 2. SOCIAL/EMOTIONAL/SPIRITUAL ASSESSMENT/ INTERVENTIONS:  SW conducted a Sales executive visit with patient's son, Lawrence Warner.  Patient was hospitalized from 11/2-11/11.  He then discharged to [facility] for rehab.  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  He provided patient history.  He was raised on a farm in [place] and then joined the [employer].  Lawrence Warner is working with the [facility] to obtain services for him when he returns to [place].  Patient will require assistance with showers and remembering his medications.  Lawrence Warner verbally consented to Palliative Care services. 3. PATIENT/CAREGIVER EDUCATION/ COPING: Provided education to Lawrence Warner regarding the [facility].  Son copes by relying on his faith. 4. PERSONAL EMERGENCY PLAN:  Per facility protocol. 5. COMMUNITY RESOURCES COORDINATION/ HEALTH CARE NAVIGATION:  None. 6. FINANCIAL/LEGAL CONCERNS/INTERVENTIONS:  None.     SOCIAL HX:  Social History   Tobacco Use  . Smoking status: Former Smoker    Packs/day: 2.00    Years: 25.00    Pack years: 50.00  . Smokeless tobacco: Never Used  Substance Use Topics  . Alcohol use: No    CODE STATUS:  DNR  ADVANCED DIRECTIVES: N MOST FORM COMPLETE:  N HOSPICE EDUCATION PROVIDED:  N Duration of  visit and documentation:  30 minutes.      Creola Corn Tomy Khim, LCSW

## 2019-01-12 DIAGNOSIS — Z20828 Contact with and (suspected) exposure to other viral communicable diseases: Secondary | ICD-10-CM | POA: Diagnosis not present

## 2019-01-14 ENCOUNTER — Telehealth: Payer: Self-pay

## 2019-01-14 ENCOUNTER — Telehealth: Payer: Self-pay | Admitting: Licensed Clinical Social Worker

## 2019-01-14 DIAGNOSIS — R001 Bradycardia, unspecified: Secondary | ICD-10-CM | POA: Diagnosis not present

## 2019-01-14 DIAGNOSIS — I495 Sick sinus syndrome: Secondary | ICD-10-CM | POA: Diagnosis not present

## 2019-01-14 DIAGNOSIS — N183 Chronic kidney disease, stage 3 unspecified: Secondary | ICD-10-CM | POA: Diagnosis not present

## 2019-01-14 DIAGNOSIS — I5033 Acute on chronic diastolic (congestive) heart failure: Secondary | ICD-10-CM | POA: Diagnosis not present

## 2019-01-14 NOTE — Telephone Encounter (Signed)
Received a call from patient's daughter in law, [name]. She was following up about referral to Palliative care and to provide update that patient will be d/c from [facility]  to [facility] on 01/21/2019. PCP will be Dr. Daphene Jaeger when patient returns to [facility].  Discussed Palliative care with [name] and team that will follow patient. Message sent to Primary Palliative team to follow up.

## 2019-01-14 NOTE — Telephone Encounter (Signed)
Palliative Care SW spoke with patient's daughter-in-law, Lawrence Warner, after receiving her message.  She stated patient is scheduled to return to [place] from [facility] next Tuesday, 11/24.  She is attempting to schedule PT/OT with [facility].  The plan is for her to contact SW with patient's rehab schedule next week and plan a visit accordingly.

## 2019-01-17 DIAGNOSIS — J9622 Acute and chronic respiratory failure with hypercapnia: Secondary | ICD-10-CM | POA: Diagnosis not present

## 2019-01-17 DIAGNOSIS — I5033 Acute on chronic diastolic (congestive) heart failure: Secondary | ICD-10-CM | POA: Diagnosis not present

## 2019-01-17 DIAGNOSIS — I495 Sick sinus syndrome: Secondary | ICD-10-CM | POA: Diagnosis not present

## 2019-01-17 DIAGNOSIS — R0789 Other chest pain: Secondary | ICD-10-CM | POA: Diagnosis not present

## 2019-01-17 DIAGNOSIS — M6281 Muscle weakness (generalized): Secondary | ICD-10-CM | POA: Diagnosis not present

## 2019-01-17 DIAGNOSIS — F419 Anxiety disorder, unspecified: Secondary | ICD-10-CM | POA: Diagnosis not present

## 2019-01-17 DIAGNOSIS — R001 Bradycardia, unspecified: Secondary | ICD-10-CM | POA: Diagnosis not present

## 2019-01-18 DIAGNOSIS — I5033 Acute on chronic diastolic (congestive) heart failure: Secondary | ICD-10-CM | POA: Diagnosis not present

## 2019-01-18 DIAGNOSIS — I495 Sick sinus syndrome: Secondary | ICD-10-CM | POA: Diagnosis not present

## 2019-01-18 DIAGNOSIS — J9622 Acute and chronic respiratory failure with hypercapnia: Secondary | ICD-10-CM | POA: Diagnosis not present

## 2019-01-18 DIAGNOSIS — R0789 Other chest pain: Secondary | ICD-10-CM | POA: Diagnosis not present

## 2019-01-20 DIAGNOSIS — H4089 Other specified glaucoma: Secondary | ICD-10-CM | POA: Diagnosis not present

## 2019-01-20 DIAGNOSIS — E1122 Type 2 diabetes mellitus with diabetic chronic kidney disease: Secondary | ICD-10-CM | POA: Diagnosis not present

## 2019-01-20 DIAGNOSIS — J9622 Acute and chronic respiratory failure with hypercapnia: Secondary | ICD-10-CM | POA: Diagnosis not present

## 2019-01-20 DIAGNOSIS — I5020 Unspecified systolic (congestive) heart failure: Secondary | ICD-10-CM | POA: Diagnosis not present

## 2019-01-20 DIAGNOSIS — R001 Bradycardia, unspecified: Secondary | ICD-10-CM | POA: Diagnosis not present

## 2019-01-20 DIAGNOSIS — Z9981 Dependence on supplemental oxygen: Secondary | ICD-10-CM | POA: Diagnosis not present

## 2019-01-20 DIAGNOSIS — Z9181 History of falling: Secondary | ICD-10-CM | POA: Diagnosis not present

## 2019-01-20 DIAGNOSIS — I5033 Acute on chronic diastolic (congestive) heart failure: Secondary | ICD-10-CM | POA: Diagnosis not present

## 2019-01-20 DIAGNOSIS — I2510 Atherosclerotic heart disease of native coronary artery without angina pectoris: Secondary | ICD-10-CM | POA: Diagnosis not present

## 2019-01-20 DIAGNOSIS — N183 Chronic kidney disease, stage 3 unspecified: Secondary | ICD-10-CM | POA: Diagnosis not present

## 2019-01-20 DIAGNOSIS — Z951 Presence of aortocoronary bypass graft: Secondary | ICD-10-CM | POA: Diagnosis not present

## 2019-01-20 DIAGNOSIS — I130 Hypertensive heart and chronic kidney disease with heart failure and stage 1 through stage 4 chronic kidney disease, or unspecified chronic kidney disease: Secondary | ICD-10-CM | POA: Diagnosis not present

## 2019-01-20 DIAGNOSIS — E7849 Other hyperlipidemia: Secondary | ICD-10-CM | POA: Diagnosis not present

## 2019-01-20 DIAGNOSIS — I495 Sick sinus syndrome: Secondary | ICD-10-CM | POA: Diagnosis not present

## 2019-01-20 DIAGNOSIS — N178 Other acute kidney failure: Secondary | ICD-10-CM | POA: Diagnosis not present

## 2019-01-20 DIAGNOSIS — J9621 Acute and chronic respiratory failure with hypoxia: Secondary | ICD-10-CM | POA: Diagnosis not present

## 2019-01-20 DIAGNOSIS — R0789 Other chest pain: Secondary | ICD-10-CM | POA: Diagnosis not present

## 2019-01-22 ENCOUNTER — Other Ambulatory Visit: Payer: Medicare Other | Admitting: Licensed Clinical Social Worker

## 2019-01-22 ENCOUNTER — Other Ambulatory Visit: Payer: Self-pay

## 2019-01-22 DIAGNOSIS — Z515 Encounter for palliative care: Secondary | ICD-10-CM

## 2019-01-22 NOTE — Progress Notes (Signed)
COMMUNITY PALLIATIVE CARE SW NOTE  PATIENT NAME: Vamsi Apfel DOB: 1918/06/30 MRN: [id number]  PRIMARY CARE PROVIDER: Wenda Low, MD  RESPONSIBLE PARTY:  Acct ID - Guarantor Home Phone Work Phone Relationship Acct Type  [id number] - Punch,[phone]  Self P/F     [address], [address], [place], [place] [address]     PLAN OF CARE and INTERVENTIONS:             1. GOALS OF CARE/ ADVANCE CARE PLANNING:  Goal is for patient to remain in his apartment at [facility].  Patient has a DNR and MOST form. 2. SOCIAL/EMOTIONAL/SPIRITUAL ASSESSMENT/ INTERVENTIONS:  SW met with patient in his apartment.  He was in his recliner.  His daughter-in-law, [name], was present for a short period.  His private caregiver, Lacie Scotts, was also present.  Patient was alert and oriented.  He complained of hip pain, but declined any medication.  A [facility] nurse has met with patient.  [name] is not sure if PT/OT will provide services.  Patient said he was born in [place] and had lived in several places.  He moved to [place] to be near his son and has lived at [place] for four years.  SW provided active listening and supportive counseling. 3. PATIENT/CAREGIVER EDUCATION/ COPING:  Provided education regarding Palliative Care to patient.  He stated he understood and consented to future visits.  He uses humor to cope. 4. PERSONAL EMERGENCY PLAN:  Caregivers will contact patient's son. 5. COMMUNITY RESOURCES COORDINATION/ HEALTH CARE NAVIGATION:  Patient has 24/7 caregivers from [facility]. 6. FINANCIAL/LEGAL CONCERNS/INTERVENTIONS:  None.     SOCIAL HX:  Social History   Tobacco Use  . Smoking status: Former Smoker    Packs/day: 2.00    Years: 25.00    Pack years: 50.00  . Smokeless tobacco: Never Used  Substance Use Topics  . Alcohol use: No    CODE STATUS:  DNR  ADVANCED DIRECTIVES: N MOST FORM COMPLETE:  Y HOSPICE EDUCATION PROVIDED: Y PPS:  Patient's appetite is normal.  He  uses a walker to ambulate. Duration of visit and documentation:  45 minutes.      Creola Corn Ty Oshima, LCSW

## 2019-01-23 DIAGNOSIS — I5033 Acute on chronic diastolic (congestive) heart failure: Secondary | ICD-10-CM | POA: Diagnosis not present

## 2019-01-23 DIAGNOSIS — J9621 Acute and chronic respiratory failure with hypoxia: Secondary | ICD-10-CM | POA: Diagnosis not present

## 2019-01-23 DIAGNOSIS — N183 Chronic kidney disease, stage 3 unspecified: Secondary | ICD-10-CM | POA: Diagnosis not present

## 2019-01-23 DIAGNOSIS — E1122 Type 2 diabetes mellitus with diabetic chronic kidney disease: Secondary | ICD-10-CM | POA: Diagnosis not present

## 2019-01-23 DIAGNOSIS — I130 Hypertensive heart and chronic kidney disease with heart failure and stage 1 through stage 4 chronic kidney disease, or unspecified chronic kidney disease: Secondary | ICD-10-CM | POA: Diagnosis not present

## 2019-01-23 DIAGNOSIS — I5020 Unspecified systolic (congestive) heart failure: Secondary | ICD-10-CM | POA: Diagnosis not present

## 2019-01-26 ENCOUNTER — Other Ambulatory Visit: Payer: Medicare Other | Admitting: Licensed Clinical Social Worker

## 2019-01-26 DIAGNOSIS — Z515 Encounter for palliative care: Secondary | ICD-10-CM

## 2019-01-26 NOTE — Progress Notes (Signed)
COMMUNITY PALLIATIVE CARE SW NOTE  PATIENT NAME: Lawrence Warner DOB: [birth year] MRN: [id number]  PRIMARY CARE PROVIDER: Wenda Low, MD  RESPONSIBLE PARTY:  Acct ID - Guarantor Home Phone Work Phone Relationship Acct Type  [id number] - [name],[name]  Self P/F     [place], [place], [place], [place] [address]   Due to the COVID-19 crisis, this virtual check-in visit was done via telephone from my office and it was initiated and consent given by thispatientand or family.  PLAN OF CARE and INTERVENTIONS:             1. GOALS OF CARE/ ADVANCE CARE PLANNING:  Patient's goal is to remain at [facility].  He has a DNR and MOST form. 2. SOCIAL/EMOTIONAL/SPIRITUAL ASSESSMENT/ INTERVENTIONS:  SW conducted a Sales executive visit with patient's son, [name].  He stated patient was dizzy over the weekend and EMS was contacted and assessed patient.  When patient returned from the hospital last, the DNR was not with him.  [name] inquired about the MOST form also on her phone message.  SW is to meet with patient, [name] and [name], [name], on December 4th at 10:30, to review MOST form and to provide a DNR.  [name] stated patient has indicated to him that he is ready to die.  Patient becomes anxious with too many visitors, but has become more himself since being prescribed medication to help symptoms.  [name] stated patient has been very clear with his end of life wishes. 3. PATIENT/CAREGIVER EDUCATION/ COPING:  Son copes by problem-solving. 4. PERSONAL EMERGENCY PLAN:  Caregivers will contact patient's son. 5. COMMUNITY RESOURCES COORDINATION/ HEALTH CARE NAVIGATION:  Patient has caregivers from [facility] 6. FINANCIAL/LEGAL CONCERNS/INTERVENTIONS:  None.     SOCIAL HX:  Social History   Tobacco Use  . Smoking status: Former Smoker    Packs/day: 2.00    Years: 25.00    Pack years: 50.00  . Smokeless tobacco: Never Used  Substance Use Topics  . Alcohol use: No     CODE STATUS:  DNR  ADVANCED DIRECTIVES:  HCPOA and LW MOST FORM COMPLETE:  Yes HOSPICE EDUCATION PROVIDED:  No PPS:  Appetite is normal.  Patient ambulates with a walker. Duration of visit and documentation:  30 minutes.      Creola Corn Sharyn Brilliant, LCSW

## 2019-01-27 ENCOUNTER — Other Ambulatory Visit: Payer: Self-pay

## 2019-01-28 ENCOUNTER — Other Ambulatory Visit: Payer: Self-pay

## 2019-01-28 ENCOUNTER — Other Ambulatory Visit: Payer: Medicare Other | Admitting: *Deleted

## 2019-01-28 ENCOUNTER — Other Ambulatory Visit: Payer: Medicare Other | Admitting: Licensed Clinical Social Worker

## 2019-01-28 DIAGNOSIS — Z515 Encounter for palliative care: Secondary | ICD-10-CM

## 2019-01-29 NOTE — Progress Notes (Signed)
COMMUNITY PALLIATIVE CARE SW NOTE  PATIENT NAME: Lawrence Warner DOB: December 26, 1918 MRN: [id number]  PRIMARY CARE PROVIDER: Wenda Low, MD  RESPONSIBLE PARTY:  Acct ID - Guarantor Home Phone Work Phone Relationship Acct Type  [id number] - Hitz,[phone]  Self P/F     [address], [address], , [place] [address]     PLAN OF CARE and INTERVENTIONS:             1. GOALS OF CARE/ ADVANCE CARE PLANNING:  Goal is for patient to remain at [facility].  Patient has a DNR and MOST form. 2. SOCIAL/EMOTIONAL/SPIRITUAL ASSESSMENT/ INTERVENTIONS:  SW and Palliative Care RN, Lawrence Warner, met with patient, his son, Lawrence Warner and his wife, Lawrence Warner, in patient's apartment.  Patient denied pain and engaged in conversation.  He was alert and oriented.  Reviewed patient's Advance Directives in [place].  MOST form was modified to include "no feeding tube".  Two DNRs and a completed MOST form were provided to patient and family.  Patient has a CNA from [employer] from 11a-1p, or from 12p-2p, which the [facility] provides.  SW provided active listening and supportive counseling. 3. PATIENT/CAREGIVER EDUCATION/ COPING:  Lawrence Warner. And Lawrence Warner cope by problem-solving. 4. PERSONAL EMERGENCY PLAN:  Caregivers/facility will contact patient's family.  Patient will rest when anxious. 5. COMMUNITY RESOURCES COORDINATION/ HEALTH CARE NAVIGATION:  [facility] provides care two hours per day.  Facility has CNA services that assist with medication reminders. 6. FINANCIAL/LEGAL CONCERNS/INTERVENTIONS:  None     SOCIAL HX:  Social History   Tobacco Use  . Smoking status: Former Smoker    Packs/day: 2.00    Years: 25.00    Pack years: 50.00  . Smokeless tobacco: Never Used  Substance Use Topics  . Alcohol use: No    CODE STATUS:  DNR  ADVANCED DIRECTIVES: HCPOA, LW and POA MOST FORM COMPLETE:  Yes HOSPICE EDUCATION PROVIDED:  No PPS:  Patient's appetite is normal.  Patient has an electric w/c for  outside of his apartment.  He uses a walker. Duration of visit and documentation:  75 minutes.      Lawrence Warner Lawrence Privott, LCSW

## 2019-02-01 DIAGNOSIS — E1122 Type 2 diabetes mellitus with diabetic chronic kidney disease: Secondary | ICD-10-CM | POA: Diagnosis not present

## 2019-02-01 DIAGNOSIS — I5033 Acute on chronic diastolic (congestive) heart failure: Secondary | ICD-10-CM | POA: Diagnosis not present

## 2019-02-01 DIAGNOSIS — I5020 Unspecified systolic (congestive) heart failure: Secondary | ICD-10-CM | POA: Diagnosis not present

## 2019-02-01 DIAGNOSIS — N183 Chronic kidney disease, stage 3 unspecified: Secondary | ICD-10-CM | POA: Diagnosis not present

## 2019-02-01 DIAGNOSIS — J9621 Acute and chronic respiratory failure with hypoxia: Secondary | ICD-10-CM | POA: Diagnosis not present

## 2019-02-01 DIAGNOSIS — I130 Hypertensive heart and chronic kidney disease with heart failure and stage 1 through stage 4 chronic kidney disease, or unspecified chronic kidney disease: Secondary | ICD-10-CM | POA: Diagnosis not present

## 2019-02-01 NOTE — Progress Notes (Signed)
COMMUNITY PALLIATIVE CARE RN NOTE  PATIENT NAME: Lawrence Warner DOB: Aug 14, 1918 MRN: [id number]  PRIMARY CARE PROVIDER: Wenda Low, MD  RESPONSIBLE PARTY:  Acct ID - Guarantor Home Phone Work Phone Relationship Acct Type  [id number] - Lawrence Warner,Lawrence [phone]  Self P/F     [address], [address], , [place] [address]   Covid-19 Pre-screening Negative  PLAN OF CARE and INTERVENTION:  1. ADVANCE CARE PLANNING/GOALS OF CARE: [place]. He has a DNR and MOST form. 2. PATIENT/CAREGIVER EDUCATION: Explained Palliative care services, Symptom Management 3. DISEASE STATUS: Joint visit made with Palliative care SW, Lynn Duffy. Met with patient, son and daughter in law at patient's apartment at [facility] Upon arrival, he is sitting up in his recliner awake and alert. He is able to answer most questions appropriately. Family does say that at times, his answers to questions are not always accurate. He denies pain. He does experience some dyspnea on exertion. He is currently on oxygen at 2.5 L/min via [place]. His son states that his oxygen level has dropped in the past with activity to the 60s. Today, after he ambulated with his walker to his bathroom and back, about 20 ft, it only dropped to 93%. Son feels that patient has improved since [place], and even from yesterday. Yesterday, patient was very dizzy and spend most of his time sitting up in his recliner with minimal activity. Today, patient denies dizziness and was able to take his shower independently. He was recently placed on Hydroxyzine about 2 weeks ago to help with anxiety, which has been effective. His intake is fair. He does require minimal assistance with bathing and dressing at times. He has a hired caregiver through [employer]  that comes 7 days/week for 2 hours to assist with personal care and household chores. This is provided by  the [place]. He also has a CNA through the facility that comes down a few times during the day to check his BP and reminds him to take his medications. He is continent of both bowel and bladder. He has a motorized chair that he utilizes when travelling outside of his room. DNR form and an additional copy left for patient and also a copy of a completed MOST form, updated to include no feeding tube. Will continue to monitor.  HISTORY OF PRESENT ILLNESS:  This is a 85 yo male who resides at [facility]. Palliative care team asked to follow patient for advanced care planning and symptom management. Will visit patient in 2 weeks.  CODE STATUS: DNR ADVANCED DIRECTIVES: Y MOST FORM: yes PPS: 50%   (Duration of visit and documentation 60 minutes)   [name], RN BSN

## 2019-02-03 DIAGNOSIS — N183 Chronic kidney disease, stage 3 unspecified: Secondary | ICD-10-CM | POA: Diagnosis not present

## 2019-02-03 DIAGNOSIS — I5020 Unspecified systolic (congestive) heart failure: Secondary | ICD-10-CM | POA: Diagnosis not present

## 2019-02-03 DIAGNOSIS — I5033 Acute on chronic diastolic (congestive) heart failure: Secondary | ICD-10-CM | POA: Diagnosis not present

## 2019-02-03 DIAGNOSIS — J9621 Acute and chronic respiratory failure with hypoxia: Secondary | ICD-10-CM | POA: Diagnosis not present

## 2019-02-03 DIAGNOSIS — E1122 Type 2 diabetes mellitus with diabetic chronic kidney disease: Secondary | ICD-10-CM | POA: Diagnosis not present

## 2019-02-03 DIAGNOSIS — I130 Hypertensive heart and chronic kidney disease with heart failure and stage 1 through stage 4 chronic kidney disease, or unspecified chronic kidney disease: Secondary | ICD-10-CM | POA: Diagnosis not present

## 2019-02-10 DIAGNOSIS — I5033 Acute on chronic diastolic (congestive) heart failure: Secondary | ICD-10-CM | POA: Diagnosis not present

## 2019-02-10 DIAGNOSIS — E1122 Type 2 diabetes mellitus with diabetic chronic kidney disease: Secondary | ICD-10-CM | POA: Diagnosis not present

## 2019-02-10 DIAGNOSIS — N183 Chronic kidney disease, stage 3 unspecified: Secondary | ICD-10-CM | POA: Diagnosis not present

## 2019-02-10 DIAGNOSIS — I5020 Unspecified systolic (congestive) heart failure: Secondary | ICD-10-CM | POA: Diagnosis not present

## 2019-02-10 DIAGNOSIS — I130 Hypertensive heart and chronic kidney disease with heart failure and stage 1 through stage 4 chronic kidney disease, or unspecified chronic kidney disease: Secondary | ICD-10-CM | POA: Diagnosis not present

## 2019-02-10 DIAGNOSIS — J9621 Acute and chronic respiratory failure with hypoxia: Secondary | ICD-10-CM | POA: Diagnosis not present

## 2019-02-17 DIAGNOSIS — I5020 Unspecified systolic (congestive) heart failure: Secondary | ICD-10-CM | POA: Diagnosis not present

## 2019-02-17 DIAGNOSIS — I130 Hypertensive heart and chronic kidney disease with heart failure and stage 1 through stage 4 chronic kidney disease, or unspecified chronic kidney disease: Secondary | ICD-10-CM | POA: Diagnosis not present

## 2019-02-17 DIAGNOSIS — E1122 Type 2 diabetes mellitus with diabetic chronic kidney disease: Secondary | ICD-10-CM | POA: Diagnosis not present

## 2019-02-17 DIAGNOSIS — J9621 Acute and chronic respiratory failure with hypoxia: Secondary | ICD-10-CM | POA: Diagnosis not present

## 2019-02-17 DIAGNOSIS — I5033 Acute on chronic diastolic (congestive) heart failure: Secondary | ICD-10-CM | POA: Diagnosis not present

## 2019-02-17 DIAGNOSIS — N183 Chronic kidney disease, stage 3 unspecified: Secondary | ICD-10-CM | POA: Diagnosis not present

## 2019-02-19 DIAGNOSIS — E7849 Other hyperlipidemia: Secondary | ICD-10-CM | POA: Diagnosis not present

## 2019-02-19 DIAGNOSIS — Z951 Presence of aortocoronary bypass graft: Secondary | ICD-10-CM | POA: Diagnosis not present

## 2019-02-19 DIAGNOSIS — Z9981 Dependence on supplemental oxygen: Secondary | ICD-10-CM | POA: Diagnosis not present

## 2019-02-19 DIAGNOSIS — N178 Other acute kidney failure: Secondary | ICD-10-CM | POA: Diagnosis not present

## 2019-02-19 DIAGNOSIS — I5033 Acute on chronic diastolic (congestive) heart failure: Secondary | ICD-10-CM | POA: Diagnosis not present

## 2019-02-19 DIAGNOSIS — Z9181 History of falling: Secondary | ICD-10-CM | POA: Diagnosis not present

## 2019-02-19 DIAGNOSIS — R001 Bradycardia, unspecified: Secondary | ICD-10-CM | POA: Diagnosis not present

## 2019-02-19 DIAGNOSIS — J9622 Acute and chronic respiratory failure with hypercapnia: Secondary | ICD-10-CM | POA: Diagnosis not present

## 2019-02-19 DIAGNOSIS — I5020 Unspecified systolic (congestive) heart failure: Secondary | ICD-10-CM | POA: Diagnosis not present

## 2019-02-19 DIAGNOSIS — I495 Sick sinus syndrome: Secondary | ICD-10-CM | POA: Diagnosis not present

## 2019-02-19 DIAGNOSIS — E1122 Type 2 diabetes mellitus with diabetic chronic kidney disease: Secondary | ICD-10-CM | POA: Diagnosis not present

## 2019-02-19 DIAGNOSIS — N183 Chronic kidney disease, stage 3 unspecified: Secondary | ICD-10-CM | POA: Diagnosis not present

## 2019-02-19 DIAGNOSIS — H4089 Other specified glaucoma: Secondary | ICD-10-CM | POA: Diagnosis not present

## 2019-02-19 DIAGNOSIS — I2510 Atherosclerotic heart disease of native coronary artery without angina pectoris: Secondary | ICD-10-CM | POA: Diagnosis not present

## 2019-02-19 DIAGNOSIS — I130 Hypertensive heart and chronic kidney disease with heart failure and stage 1 through stage 4 chronic kidney disease, or unspecified chronic kidney disease: Secondary | ICD-10-CM | POA: Diagnosis not present

## 2019-02-19 DIAGNOSIS — J9621 Acute and chronic respiratory failure with hypoxia: Secondary | ICD-10-CM | POA: Diagnosis not present

## 2019-02-19 DIAGNOSIS — R0789 Other chest pain: Secondary | ICD-10-CM | POA: Diagnosis not present

## 2019-02-24 DIAGNOSIS — I5033 Acute on chronic diastolic (congestive) heart failure: Secondary | ICD-10-CM | POA: Diagnosis not present

## 2019-02-24 DIAGNOSIS — E1122 Type 2 diabetes mellitus with diabetic chronic kidney disease: Secondary | ICD-10-CM | POA: Diagnosis not present

## 2019-02-24 DIAGNOSIS — I130 Hypertensive heart and chronic kidney disease with heart failure and stage 1 through stage 4 chronic kidney disease, or unspecified chronic kidney disease: Secondary | ICD-10-CM | POA: Diagnosis not present

## 2019-02-24 DIAGNOSIS — J9621 Acute and chronic respiratory failure with hypoxia: Secondary | ICD-10-CM | POA: Diagnosis not present

## 2019-02-24 DIAGNOSIS — N183 Chronic kidney disease, stage 3 unspecified: Secondary | ICD-10-CM | POA: Diagnosis not present

## 2019-02-24 DIAGNOSIS — I5020 Unspecified systolic (congestive) heart failure: Secondary | ICD-10-CM | POA: Diagnosis not present

## 2019-03-10 DIAGNOSIS — N183 Chronic kidney disease, stage 3 unspecified: Secondary | ICD-10-CM | POA: Diagnosis not present

## 2019-03-10 DIAGNOSIS — M7989 Other specified soft tissue disorders: Secondary | ICD-10-CM | POA: Insufficient documentation

## 2019-03-10 DIAGNOSIS — J9621 Acute and chronic respiratory failure with hypoxia: Secondary | ICD-10-CM | POA: Diagnosis not present

## 2019-03-10 DIAGNOSIS — Z7189 Other specified counseling: Secondary | ICD-10-CM | POA: Insufficient documentation

## 2019-03-10 DIAGNOSIS — N1832 Chronic kidney disease, stage 3b: Secondary | ICD-10-CM | POA: Insufficient documentation

## 2019-03-10 DIAGNOSIS — I130 Hypertensive heart and chronic kidney disease with heart failure and stage 1 through stage 4 chronic kidney disease, or unspecified chronic kidney disease: Secondary | ICD-10-CM | POA: Diagnosis not present

## 2019-03-10 DIAGNOSIS — I5020 Unspecified systolic (congestive) heart failure: Secondary | ICD-10-CM | POA: Diagnosis not present

## 2019-03-10 DIAGNOSIS — I5033 Acute on chronic diastolic (congestive) heart failure: Secondary | ICD-10-CM | POA: Diagnosis not present

## 2019-03-10 DIAGNOSIS — E1122 Type 2 diabetes mellitus with diabetic chronic kidney disease: Secondary | ICD-10-CM | POA: Diagnosis not present

## 2019-03-10 NOTE — Progress Notes (Deleted)
Cardiology Office Note   Date:  03/10/2019   ID:  Lawrence Warner, DOB 12-Oct-1918, MRN [id number]  PCP:  Wenda Low, MD  Cardiologist:   Minus Breeding, MD    No chief complaint on file.     History of Present Illness: Lawrence Warner is a 86 y.o. male who presents for evaluation of bradycardia and edema.   He was admitted 07/20/16-07/26/16 with CAP and acute respiratory failure. He came back to the ED 07/27/16 with respiratory failure felt to be secondary to diastolic CHF.  He was last seen in the office in Dec 2019.  He was in the hospital multiple times last year.   I reviewed 4 of these hospitalizations for this appt.  He had 3 times acute SOB with diastolic dysfunction.  At the last appt he had anxiety treated and Lasix was held because his creat was up .  This was in Nov.  He has not been in the hospital since then.   ***    January.   Since he was last seen he is done relatively well.  He lives at [facility].  He has an 31 year old girlfriend.  He does some walking.  He rides his cart.  He denies any cardiovascular symptoms.  He does not have any presyncope or syncope.  He has no chest pressure, neck or arm discomfort.  He has no weight gain.  He has a little leg cramping but he has a foam that he puts on his legs that seems to help.  He got this at [facility].   Past Medical History:  Diagnosis Date  . BPH (benign prostatic hypertrophy)   . Chronic heart failure ([place])   . Chronic renal insufficiency   . Coronary artery disease    CABG in 2000  . Diabetes type 2, controlled ([place])    Diet-controlled  . Diabetic neuropathy ([place])   . Dyslipidemia   . Hypertension   . ITP (idiopathic thrombocytopenic purpura)   . Osteoarthritis   . Pneumonia     Past Surgical History:  Procedure Laterality Date  . CHOLECYSTECTOMY    . CORONARY ARTERY BYPASS GRAFT  2000  . Right hip replacement  2014  . TONSILLECTOMY      Prior to Admission medications   Medication Sig  Start Date End Date Taking? Authorizing Provider  acetaminophen (TYLENOL) 500 MG tablet Take 500 mg by mouth as needed.   Yes [provider]  brimonidine (ALPHAGAN) 0.2 % ophthalmic solution Place 1 drop into both eyes 3 (three) times daily.   Yes [provider]  cycloSPORINE (RESTASIS) 0.05 % ophthalmic emulsion Place 1 drop into both eyes every 12 (twelve) hours.    Yes [provider]  Dextran 70-Hypromellose (ARTIFICIAL TEARS PF OP) Place 1 drop into both eyes 4 (four) times daily.    Yes [provider]  hydroxypropyl methylcellulose / hypromellose (ISOPTO TEARS / GONIOVISC) 2.5 % ophthalmic solution Place 1 drop into both eyes 3 (three) times daily as needed for dry eyes.   Yes [provider]  magnesium oxide (MAG-OX) 400 MG tablet Take 400 mg daily by mouth.   Yes [provider]  Multiple Vitamin (MULTIVITAMIN WITH MINERALS) TABS tablet Take 1 tablet by mouth daily.   Yes [provider]  nitroGLYCERIN (NITROSTAT) 0.4 MG SL tablet Place 0.4 mg under the tongue every 5 (five) minutes as needed for chest pain.   Yes [provider]  omeprazole (PRILOSEC) 20 MG capsule  Take 1 capsule (20 mg total) by mouth daily before breakfast. 03/19/17  Yes Rosita Fire, MD  simvastatin (ZOCOR) 20 MG tablet Take 20 mg by mouth daily.   Yes [provider]     Allergies:   Alfuzosin hcl er, Imdur [isosorbide dinitrate], Penicillins, and Tape    ROS:  Please see the history of present illness.   Otherwise, review of systems are positive for none.   All other systems are reviewed and negative.    PHYSICAL EXAM: VS:  There were no vitals taken for this visit. , BMI There is no height or weight on file to calculate BMI.  GENERAL:  Well appearing for his age NECK:  No jugular venous distention, waveform within normal limits, carotid upstroke brisk and symmetric, no bruits, no thyromegaly LUNGS:  Clear to auscultation  bilaterally CHEST:  Unremarkable HEART:  PMI not displaced or sustained,S1 and S2 within normal limits, no S3, no S4, no clicks, no rubs, no murmurs ABD:  Flat, positive bowel sounds normal in frequency in pitch, no bruits, no rebound, no guarding, no midline pulsatile mass, no hepatomegaly, no splenomegaly EXT:  2 plus pulses throughout, no edema, no cyanosis no clubbing   EKG:  EKG is  ordered today. The ekg ordered 10/05/15 demonstrates sinus bradycardia, rate 55, left axis deviation, left bundle branch block.  PACs.    Recent Labs: 12/28/2018: Magnesium 2.1 01/09/2019: B Natriuretic Peptide 209.5 01/10/2019: ALT 15; BUN 63; Creatinine, Ser 1.90; Hemoglobin 11.4; Platelets 129; Potassium 4.4; Sodium 143    Lipid Panel    Component Value Date/Time   CHOL 92 05/18/2017 0217   CHOL 100 05/23/2015 0919   TRIG 101 05/18/2017 0217   TRIG 126 05/23/2015 0919   HDL 25 (L) 05/18/2017 0217   HDL 29 (L) 05/23/2015 0919   CHOLHDL 3.7 05/18/2017 0217   VLDL 20 05/18/2017 0217   LDLCALC 47 05/18/2017 0217   LDLCALC 46 05/23/2015 0919      Wt Readings from Last 3 Encounters:  01/10/19 177 lb 7.5 oz (80.5 kg)  01/06/19 181 lb 3.5 oz (82.2 kg)  11/24/18 190 lb 0.6 oz (86.2 kg)      Other studies Reviewed: Additional studies/ records that were reviewed today include: *** Review of the above records demonstrates:   ***  ASSESSMENT AND PLAN:  BRADYCARDIA:  ***  He is not having any symptoms related to this.  No further testing is indicated.   CHRONIC DIASTOLIC DYSFUNCTION:  ***   EDEMA: ***  e said this is very mild and not bothering him.  No change in therapy.  CAD:  ***  He did not tolerate Imdur.  He is not having any chest discomfort.  No further evaluation or change in therapy is indicated.   LEG CRAMPING:  ***He still gets a little leg cramping.  He thinks it is better with his over-the-counter therapy.  We did take him off Zocor and I do not see a reason to restart  this.  CKD III:  ***   COVID EDUCATION:  ***   Current medicines are reviewed at length with the patient today.  The patient does not have concerns regarding medicines.  The following changes have been made:  None  Labs/ tests ordered today include:  None  No orders of the defined types were placed in this encounter.    Disposition:   FU with in one year.    Signed, Minus Breeding, MD  03/10/2019 7:46 PM  [facility]

## 2019-03-11 ENCOUNTER — Ambulatory Visit: Payer: Medicare Other | Admitting: Cardiology

## 2019-03-13 DIAGNOSIS — E1169 Type 2 diabetes mellitus with other specified complication: Secondary | ICD-10-CM | POA: Diagnosis not present

## 2019-03-13 DIAGNOSIS — I10 Essential (primary) hypertension: Secondary | ICD-10-CM | POA: Diagnosis not present

## 2019-03-19 ENCOUNTER — Other Ambulatory Visit: Payer: Self-pay

## 2019-03-19 ENCOUNTER — Other Ambulatory Visit: Payer: Medicare Other | Admitting: Licensed Clinical Social Worker

## 2019-03-19 ENCOUNTER — Ambulatory Visit: Payer: Medicare Other | Admitting: Cardiology

## 2019-03-19 DIAGNOSIS — Z515 Encounter for palliative care: Secondary | ICD-10-CM

## 2019-03-22 NOTE — Progress Notes (Signed)
COMMUNITY PALLIATIVE CARE SW NOTE  PATIENT NAME: Lawrence Warner DOB: [birth year] MRN: [id number]  PRIMARY CARE PROVIDER: Wenda Low, MD  RESPONSIBLE PARTY:  Acct ID - Guarantor Home Phone Work Phone Relationship Acct Type  [id number] - Lawrence Warner,Lawrence Warner  Self P/F     [address], [address], [place],  [address]     PLAN OF CARE and INTERVENTIONS:             1. GOALS OF CARE/ ADVANCE CARE PLANNING:  Patient's goal is to remain in his apartment.  He has a DNR and MOST form. 2. SOCIAL/EMOTIONAL/SPIRITUAL ASSESSMENT/ INTERVENTIONS:  SW met with patient inside his apartment at [facility].  He was lying in bed and stated he was taking a nap.  "It helps after I eat lunch."  He denied pain.  Patient reports eating and sleeping well.  Consulted the facility Lawrence Warner, Lawrence Warner.  No concerns at this time. 3. PATIENT/CAREGIVER EDUCATION/ COPING:  Patient copes by problem-solving. 4. PERSONAL EMERGENCY PLAN:  Caregivers will contact patient's family. 5. COMMUNITY RESOURCES COORDINATION/ HEALTH CARE NAVIGATION:  Facility staff assists with medication reminders.  Lawrence Warner provides care two hours a day. 6. FINANCIAL/LEGAL CONCERNS/INTERVENTIONS:  None.     SOCIAL HX:  Social History   Tobacco Use  . Smoking status: Former Smoker    Packs/day: 2.00    Years: 25.00    Pack years: 50.00  . Smokeless tobacco: Never Used  Substance Use Topics  . Alcohol use: No    CODE STATUS:  DNR ADVANCED DIRECTIVES:  HCPOA, LW, POA MOST FORM COMPLETE:  Yes HOSPICE EDUCATION PROVIDED:  No PPS:  Patient reports his appetite is normal.  Patient uses a walker and has an electric w/c for outside. Duration of visit and documentation:  30 minutes.      Lawrence Corn Kyli Sorter, LCSW

## 2019-03-28 DIAGNOSIS — I447 Left bundle-branch block, unspecified: Secondary | ICD-10-CM | POA: Diagnosis not present

## 2019-03-28 DIAGNOSIS — R0902 Hypoxemia: Secondary | ICD-10-CM | POA: Diagnosis not present

## 2019-03-28 DIAGNOSIS — R079 Chest pain, unspecified: Secondary | ICD-10-CM | POA: Diagnosis not present

## 2019-03-28 DIAGNOSIS — R0789 Other chest pain: Secondary | ICD-10-CM | POA: Diagnosis not present

## 2019-03-28 DIAGNOSIS — I959 Hypotension, unspecified: Secondary | ICD-10-CM | POA: Diagnosis not present

## 2019-04-01 ENCOUNTER — Other Ambulatory Visit: Payer: Self-pay

## 2019-04-01 ENCOUNTER — Other Ambulatory Visit: Payer: Medicare Other | Admitting: Licensed Clinical Social Worker

## 2019-04-01 DIAGNOSIS — Z515 Encounter for palliative care: Secondary | ICD-10-CM

## 2019-04-04 NOTE — Progress Notes (Signed)
COMMUNITY PALLIATIVE CARE SW NOTE  PATIENT NAME: Lawrence Warner DOB: [birth year] MRN: [id number]  PRIMARY CARE PROVIDER: Wenda Low, MD  RESPONSIBLE PARTY:  Acct ID - Guarantor Home Phone Work Phone Relationship Acct Type  [id number] - Elden,[phone]  Self P/F     [address], [address], [place], [place] [address]     PLAN OF CARE and INTERVENTIONS:             1. GOALS OF CARE/ ADVANCE CARE PLANNING:  Goal is for patient to remain in his apartment.  He has a MOST form and DNR. 2. SOCIAL/EMOTIONAL/SPIRITUAL ASSESSMENT/ INTERVENTIONS:  SW met with patient and his caregiver, Freda Munro, in his apartment at [facility].  Patient was in his recliner with his O2 on.  He stated he fell last Monday from tripping on his O2 cord.  He was able to contact staff and they assisted him.  He denied any injuries, but complained of occasional pain.  He reports feeling "well" and had no concerns. 3. PATIENT/CAREGIVER EDUCATION/ COPING:  Patient copes by problem-solving. 4. PERSONAL EMERGENCY PLAN:  Caregivers will contact his family. 5. COMMUNITY RESOURCES COORDINATION/ HEALTH CARE NAVIGATION:  [facility] provide caregivers two hours a day around lunch.  Facility staff assist patient with medication reminders. 6. FINANCIAL/LEGAL CONCERNS/INTERVENTIONS:  None.     SOCIAL HX:  Social History   Tobacco Use  . Smoking status: Former Smoker    Packs/day: 2.00    Years: 25.00    Pack years: 50.00  . Smokeless tobacco: Never Used  Substance Use Topics  . Alcohol use: No    CODE STATUS:   ADVANCED DIRECTIVES:  MOST FORM COMPLETE:   HOSPICE EDUCATION PROVIDED:   PPS:  Patient's appetite is normal.  He ambulates with a walker inside of his apartment and uses and electric w/c outside. Duration of visit and documentation:  30 minutes.      Creola Corn Witt Plitt, LCSW

## 2019-04-30 DIAGNOSIS — J449 Chronic obstructive pulmonary disease, unspecified: Secondary | ICD-10-CM | POA: Diagnosis not present

## 2019-04-30 DIAGNOSIS — Z9981 Dependence on supplemental oxygen: Secondary | ICD-10-CM | POA: Diagnosis not present

## 2019-04-30 DIAGNOSIS — I5022 Chronic systolic (congestive) heart failure: Secondary | ICD-10-CM | POA: Diagnosis not present

## 2019-04-30 DIAGNOSIS — I10 Essential (primary) hypertension: Secondary | ICD-10-CM | POA: Diagnosis not present

## 2019-05-12 ENCOUNTER — Other Ambulatory Visit: Payer: Medicare Other | Admitting: *Deleted

## 2019-05-12 ENCOUNTER — Other Ambulatory Visit: Payer: Self-pay

## 2019-05-12 DIAGNOSIS — Z515 Encounter for palliative care: Secondary | ICD-10-CM

## 2019-05-14 ENCOUNTER — Ambulatory Visit: Payer: Medicare Other | Admitting: Cardiology

## 2019-05-17 NOTE — Progress Notes (Signed)
COMMUNITY PALLIATIVE CARE RN NOTE  PATIENT NAME: Lawrence Warner DOB: 09-08-1918 MRN: [id number]  PRIMARY CARE PROVIDER: Wenda Low, MD  RESPONSIBLE PARTY: Elmon Else (son) Acct ID - Guarantor Home Phone Work Phone Relationship Acct Type  [id number] - Savard,[phone]  Self P/F     [address], [address], [place], [place] [address]   Covid-19 Pre-screening Negative  PLAN OF CARE and INTERVENTION:  1. ADVANCE CARE PLANNING/GOALS OF CARE: Goal is for [place] and [place]izations.  2. PATIENT/CAREGIVER EDUCATION: Symptom management, safe mobility/transfers 3. DISEASE STATUS: [place]. Upon arrival, patient is lying in bed awake and alert. He is able to answer simple questions and make his needs known. He denies pain at this time. He is wearing oxygen at 2.5 L/min via [place]. He does become dyspneic with exertion at times. He is ambulatory using a walker. When travelling outside of his room, he utilizes his motorized chair. He goes to the facility dining hall since it is now open, for all 3 meals. He says that he is able to bathe, dress and toilet himself independently. He does have a hired caregiver that visits daily from 12p-2p to assist with household chores and makes sure that he takes his daily medications. His son prepares a pill box weekly. He is continent of both bowel and bladder. He says his intake is normal and he has no difficulties with swallowing. He says he is sleeping well during the night and takes a nap during the day. Will continue to monitor.   HISTORY OF PRESENT ILLNESS: This is a 86 yo male who resides at [place] in an [place] apartment. Palliative care continues to follow patient and visits monthly and PRN.   CODE STATUS: DNR ADVANCED DIRECTIVES: Y MOST FORM: no PPS: 50%   PHYSICAL EXAM:   LUNGS: clear to auscultation  CARDIAC: Cor RRR EXTREMITIES: No edema SKIN: Exposed skin is dry  and intact  NEURO: Alert and oriented x 2, forgetful, generalized weakness, ambulatory w/walker   (Duration of visit and documentation 30 minutes)   [name], RN BSN

## 2019-05-31 ENCOUNTER — Non-Acute Institutional Stay: Payer: Medicare Other

## 2019-05-31 ENCOUNTER — Other Ambulatory Visit: Payer: Self-pay

## 2019-05-31 DIAGNOSIS — Z515 Encounter for palliative care: Secondary | ICD-10-CM

## 2019-06-01 NOTE — Progress Notes (Signed)
COMMUNITY PALLIATIVE CARE SW NOTE  PATIENT NAME: Lawrence Warner DOB: 07-13-1918 MRN: [id number]  PRIMARY CARE PROVIDER: Wenda Low, MD  RESPONSIBLE PARTY:  Acct ID - Guarantor Home Phone Work Phone Relationship Acct Type  [id number] - [place],[phone]  Self P/F     [address], [address], [place], [place] [address]     PLAN OF CARE and INTERVENTIONS:             1. GOALS OF CARE/ ADVANCE CARE PLANNING:  Patient is a DNR, form is in his apartment.  2. SOCIAL/EMOTIONAL/SPIRITUAL ASSESSMENT/ INTERVENTIONS: Palliative Care SW met with patient in his apartment. He was sitting up in a chair, watching TV without is his o2 on. He talked with SW several minutes as SW introduced herself to him as new palliative care SW and he gave social history on himself. Patient appeared to he having some shortness of breath as he talked and later after he got into his scooter. SW reminded him that he did not have his 02 on. He quickly put the portable o2 on as he prepared to go to lunch. SW observed patient  ambulating a few steps in his apartment and then getting into his electric scooter. SW walked with patient down to the dinning room. He reported that his appetite remains good, but does have SOB going to and from meals. He reported that he does not go down to activities as he prefers to stay in his room and watch TV. SW consulted with the activities director, who reported that patient seems to be more forgetful as he is forgetting meal times and has to be reminded. He also has to be reminded to put on his 59. SW provided introduction as palliative care SW, active and supportive listening, reinforced safety interventions(wear 02, use assistive devices ambulating in apartment) and consultation with facility staff. 3. PATIENT/CAREGIVER EDUCATION/ COPING: Patient is alert and oriented to self and place. Patient was in a pleasant and cheerful mood. He denied any anxiety or depressed mood. Patient was  engaged in open dialogue with SW and was open to visits/support from the palliative care SW/team.  4. PERSONAL EMERGENCY PLAN:  SW reinforced reassurance of support and visits from the palliative care team. Patient/facility can activate 911 for emergencies. 5. COMMUNITY RESOURCES COORDINATION/ HEALTH CARE NAVIGATION: Patient has social supports through the activities program at the facility. Palliative care team will also remain a source of emotional and health support.  6. FINANCIAL/LEGAL CONCERNS/INTERVENTIONS: No financial or legal concerns assessed at this time.      SOCIAL HX:  Social History   Tobacco Use  . Smoking status: Former Smoker    Packs/day: 2.00    Years: 25.00    Pack years: 50.00  . Smokeless tobacco: Never Used  Substance Use Topics  . Alcohol use: No    CODE STATUS:   Code Status: Prior DNR ADVANCED DIRECTIVES: N MOST FORM COMPLETE: No HOSPICE EDUCATION PROVIDED: No  PPS: Patient is independent of ADL's. He ambulate short distances in his apartment and has an Transport planner to ambulate outside of his apartment, to and from meals/activities. Patient has to be reminded to put on his o2 as he is having increased forgetfulness. He has SOB with ambulation and dialogue.   I spent 35 minutes with the patient, introduction, providing education,emotional support, active listening and reinforcing availability of palliative team and personal support network.      [address] [place], 

## 2019-06-03 DIAGNOSIS — E119 Type 2 diabetes mellitus without complications: Secondary | ICD-10-CM | POA: Diagnosis not present

## 2019-06-03 DIAGNOSIS — M6281 Muscle weakness (generalized): Secondary | ICD-10-CM | POA: Diagnosis not present

## 2019-06-03 DIAGNOSIS — Z9981 Dependence on supplemental oxygen: Secondary | ICD-10-CM | POA: Diagnosis not present

## 2019-06-03 DIAGNOSIS — J449 Chronic obstructive pulmonary disease, unspecified: Secondary | ICD-10-CM | POA: Diagnosis not present

## 2019-06-09 ENCOUNTER — Ambulatory Visit: Payer: Medicare Other | Admitting: Cardiology

## 2019-06-30 ENCOUNTER — Encounter: Payer: Self-pay | Admitting: General Practice

## 2019-07-19 ENCOUNTER — Other Ambulatory Visit: Payer: Medicare Other | Admitting: *Deleted

## 2019-07-19 ENCOUNTER — Other Ambulatory Visit: Payer: Self-pay

## 2019-07-19 DIAGNOSIS — Z515 Encounter for palliative care: Secondary | ICD-10-CM

## 2019-07-21 NOTE — Progress Notes (Signed)
COMMUNITY PALLIATIVE CARE RN NOTE  PATIENT NAME: Lawrence Warner DOB: 1918-12-27 MRN: [id number]  PRIMARY CARE PROVIDER: Wenda Low, MD  RESPONSIBLE PARTY:  Acct ID - Guarantor Home Phone Work Phone Relationship Acct Type  [id number] - [place],[phone]  Self P/F     [address], [address], [place], [place] [address]   Covid-19 Pre-screening Negative  PLAN OF CARE and INTERVENTION:  1. ADVANCE CARE PLANNING/GOALS OF CARE: Goal is for [place] facility for as long as possible. He has a DNR. 2. PATIENT/CAREGIVER EDUCATION: Symptom management, safe mobility/transfers 3. DISEASE STATUS: Met with [place] in his [place] apartment at [facility]. Upon arrival, patient is lying in bed. He is awake and alert, and says that he is just waking up from a nap. He is able to engage in conversation and make his needs known, but am unsure of the accuracy of details. He denies pain at this time. He does report becoming short of breath with exertion, but recovers quickly at rest. No shortness of breath during visit, but I did notice that he was not wearing his oxygen. I placed his oxygen back on. He says that he remains able to bathe, dress, toilet, ambulate and feed himself independently, but it does tire him out. He has to take frequent rest periods. He uses a cane during ambulation at times while inside of his apartment. Other times he holds onto furniture and/or walls for stability. He denies any recent falls. He continues with a caregiver that comes in to make sure that he is taking his medications and for light housework. He uses his motorized chair when travelling outside of his apartment. He attends all 3 meals in the dining hall and says he has a good appetite. He does have table mates that he socializes with daily and enjoys this. He says he does not participate in facility activities. He is sleeping well during the night, and feels that he is sleeping more during the  day. He is taking a nap after each meal. He is continent of both bowel and bladder. He is appreciative of visit. Will continue to monitor.   HISTORY OF PRESENT ILLNESS: This is a 86 yo male who resides at [facility] in an [place] apartment. Palliative care continues to follow patient and visits monthly and PRN.    CODE STATUS: DNR  ADVANCED DIRECTIVES: Y MOST FORM: no PPS: 50%   PHYSICAL EXAM:   LUNGS: clear to auscultation  CARDIAC: Cor RRR EXTREMITIES: No edema SKIN: Thin/frail skin  NEURO: Alert and oriented x 2 (person/place), forgetful, pleasant mood, ambulatory   (Duration of visit and documentation 45 minutes)   [name], RN BSN

## 2019-08-16 ENCOUNTER — Other Ambulatory Visit: Payer: Self-pay

## 2019-08-16 ENCOUNTER — Other Ambulatory Visit: Payer: Medicare Other

## 2019-08-16 DIAGNOSIS — Z515 Encounter for palliative care: Secondary | ICD-10-CM

## 2019-08-19 NOTE — Progress Notes (Signed)
COMMUNITY PALLIATIVE CARE SW NOTE  PATIENT NAME: Lawrence Warner DOB: Jan 20, 1919 MRN: [id number]  PRIMARY CARE PROVIDER: Wenda Low, MD  RESPONSIBLE PARTY:  Acct ID - Guarantor Home Phone Work Phone Relationship Acct Type  [id number] - [place],[phone]  Self P/F     [address], [address], [place], [place] [address]     PLAN OF CARE and INTERVENTIONS:             1. GOALS OF CARE/ ADVANCE CARE PLANNING: Goal is for patient to remain as independent as possible. Patient is a DNR. 1. SOCIAL/EMOTIONAL/SPIRITUAL ASSESSMENT/ INTERVENTIONS:  Palliative Care SW met with patient in his apartment. He was sitting up in a chair, watching TV with his o2 on. He talked with SW several minutes about spending Father's Day with his son. He showed SW pictures of his grandchildren.  Patient appeared to he having mild shortness of breath as he talked. Patient reported tha his small portable tank was not working. He has been going to meals without 02. SW obtained the name and number of the company that could service the device and encouraged him or for him to have his son call.  SW observed patient  ambulating a few steps in his apartment and then getting into his electric scooter. SW walked with patient down to the dinning room. His appetite remains good, but he continues to have SOB going to and from meals. He reported that he does not go down to activities as he prefers to stay in his room and watch TV. SW consulted with facility administrative assistant-Amy regarding the need for patient's 02 tank to be serviced. She stated that she will talk patient's son regarding this issues. SW provided supportive presence, active and supportive listening, reinforced safety interventions(wear 02, use assistive devices ambulating in apartment), encouraged him to call to have 02 device fixed and consultation with facility staff-Amy. 2. PATIENT/CAREGIVER EDUCATION/  3. PATIENT/CAREGIVER EDUCATION/ COPING:  Patient  was pleasant, engaged and seems to be coping well. He expressed concern about not having his portable 02. SW encouraged him to call to have the device fixed. He is open to ongoing visits from the palliative care team.  4. PERSONAL EMERGENCY PLAN:  Per facility protocol. Patient has phone access and is able to call his son for his assistance.  5. COMMUNITY RESOURCES COORDINATION/ HEALTH CARE NAVIGATION:  Patient's son is very supportive. He is well-known in the facility and can access support as needed from the staff and peers. Patient will call to have is 02 tanked assessed to be fixed. 6. FINANCIAL/LEGAL CONCERNS/INTERVENTIONS:  No legal or financial issues.      SOCIAL HX:  Social History   Tobacco Use  . Smoking status: Former Smoker    Packs/day: 2.00    Years: 25.00    Pack years: 50.00  . Smokeless tobacco: Never Used  Substance Use Topics  . Alcohol use: No    CODE STATUS: DNR ADVANCED DIRECTIVES: No MOST FORM COMPLETE:  No HOSPICE EDUCATION PROVIDED: No  PPS: Patient is independent of ADL's. He ambulate short distances in his apartment and has an Transport planner to ambulate outside of his apartment, to and from meals/activities. Patient has to be reminded to put on his o2 as he is having increased forgetfulness. He has SOB with ambulation and dialogue. He is currently without portable 02 and will call to have the device fixed.  Duration of visit and documentation 60 minutes    [name], LCSW

## 2019-08-28 DIAGNOSIS — Z9981 Dependence on supplemental oxygen: Secondary | ICD-10-CM | POA: Diagnosis not present

## 2019-08-28 DIAGNOSIS — J449 Chronic obstructive pulmonary disease, unspecified: Secondary | ICD-10-CM | POA: Diagnosis not present

## 2019-08-28 DIAGNOSIS — I10 Essential (primary) hypertension: Secondary | ICD-10-CM | POA: Diagnosis not present

## 2019-08-28 DIAGNOSIS — M6281 Muscle weakness (generalized): Secondary | ICD-10-CM | POA: Diagnosis not present

## 2019-09-06 ENCOUNTER — Other Ambulatory Visit: Payer: Self-pay

## 2019-09-06 ENCOUNTER — Other Ambulatory Visit: Payer: Medicare Other | Admitting: *Deleted

## 2019-09-06 DIAGNOSIS — Z515 Encounter for palliative care: Secondary | ICD-10-CM

## 2019-09-26 NOTE — Progress Notes (Signed)
COMMUNITY PALLIATIVE CARE RN NOTE  PATIENT NAME: Lawrence Warner DOB: 02-19-1919 MRN: [id number]  PRIMARY CARE PROVIDER: Wenda Low, MD  RESPONSIBLE PARTY:  Acct ID - Guarantor Home Phone Work Phone Relationship Acct Type  [id number] - [place],[phone]  Self P/F     [address], [address], [place], [place] [address]   Covid-19 Pre-screening Negative  PLAN OF CARE and INTERVENTION:  1. ADVANCE CARE PLANNING/GOALS OF CARE: Goal is for patient to remain at his current apartment at [facility]  2. PATIENT/CAREGIVER EDUCATION: Symptom management, dyspnea management, safe mobility 3. DISEASE STATUS: Met with patient in his apartment. Upon arrival, he is sitting up in his recliner awake and alert watching TV. He is alert and oriented x 3 with some forgetfulness. He is able to answer questions appropriately and make his needs known. He reports pain in his neck where he has limited ROM turning his head and looking up and down. He does take Tylenol as needed, which is effective. He does become short of breath with exertion, but recovers fairly quickly while at rest. He is on oxygen at 3L/min via [place]. He did get his portable oxygen fixed so this is no longer an issue.  He ambulates using a walker while in his apartment and uses his motorized chair when travelling within the facility. He continues to eat all 3 meals in the dining hall downstairs. He sits with the same group of men each time and enjoys this interaction. He does not participate in activities, as he prefers to be in his apartment. He is able to shower, dress and toilet himself. He has a caregiver that comes daily to make sure that he takes his medications. He is still napping between meals and reports sleeping well throughout the night. He does go out periodically with his son to his home to spend time with them. Will continue to monitor.  HISTORY OF PRESENT ILLNESS: This is a 86 yo male with a diagnosis of CHF. He has a  h/o HTN, CAD, GERD, DM II and CKD Stage 3b.Palliative care continues to follow patient and visits monthly and PRN.   CODE STATUS: DNR  ADVANCED DIRECTIVES: Y MOST FORM: no PPS: 50%   PHYSICAL EXAM:   LUNGS: clear to auscultation  CARDIAC: Cor RRR EXTREMITIES: No edema SKIN: Exposed skin is dry and intact  NEURO: alert and oriented x 3, pleasant mood and engaging, HOH, ambulatory w/walker    (Duration of visit and documentation 45 minutes)   [name], RN BSN

## 2019-10-26 ENCOUNTER — Non-Acute Institutional Stay: Payer: Medicare Other

## 2019-10-26 ENCOUNTER — Other Ambulatory Visit: Payer: Self-pay

## 2019-10-26 DIAGNOSIS — Z515 Encounter for palliative care: Secondary | ICD-10-CM

## 2019-10-26 DIAGNOSIS — J449 Chronic obstructive pulmonary disease, unspecified: Secondary | ICD-10-CM | POA: Diagnosis not present

## 2019-10-26 DIAGNOSIS — Z9981 Dependence on supplemental oxygen: Secondary | ICD-10-CM | POA: Diagnosis not present

## 2019-10-26 DIAGNOSIS — I5022 Chronic systolic (congestive) heart failure: Secondary | ICD-10-CM | POA: Diagnosis not present

## 2019-10-26 DIAGNOSIS — I10 Essential (primary) hypertension: Secondary | ICD-10-CM | POA: Diagnosis not present

## 2019-10-27 NOTE — Progress Notes (Signed)
COMMUNITY PALLIATIVE CARE SW NOTE  PATIENT NAME: Lawrence Warner DOB: 11/30/1918 MRN: [id number]  PRIMARY CARE PROVIDER: Wenda Low, MD  RESPONSIBLE PARTY:  Acct ID - Guarantor Home Phone Work Phone Relationship Acct Type  [id number] - [place],[phone]  Self P/F     [address], [address], [place], [place] [address]     PLAN OF CARE and INTERVENTIONS:             1. GOALS OF CARE/ ADVANCE CARE PLANNING:  Goal is for patient to remain at his current apartment at [facility]. Patient is a DNR. 2. SOCIAL/EMOTIONAL/SPIRITUAL ASSESSMENT/ INTERVENTIONS: SW completed a face-to-face visit with patient at the facility [facility]) where he was present with Dr. Montel Culver and his daughter-in-law-[name]. Patient was being assessed by the doctor. He was wearing his 02. However, when his 02 is off, his oxygen saturation drops. Patient appears to be comfortable and he denied pain. He continue to go down for meals.  He appears to be thinner as his clothes appeared baggy and loose. He report that his appetite remains good and is supplemented with an Ensure. Patient continues to ambulate in his room independently, but slowly. [name] note that patient seems increasingly more forgetful and confusion. He has difficulty with his short term memory. [name] advised that she or husband have daily contact with patient. 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 SW will provide ongoing psychosocial assessment of needs, comfort and coping routinely.    3. PATIENT/CAREGIVER EDUCATION/ COPING:  Patient was pleasant, engaged and seems to be coping well.   4. PERSONAL EMERGENCY PLAN:  Per facility protocol.  5. COMMUNITY RESOURCES COORDINATION/ HEALTH CARE NAVIGATION:  Patient receiving  medication reminders and assistance with personal care. 6. FINANCIAL/LEGAL CONCERNS/INTERVENTIONS:  None     SOCIAL HX:  Social History   Tobacco Use  . Smoking status: Former Smoker    Packs/day: 2.00    Years: 25.00    Pack years: 50.00  . Smokeless tobacco: Never Used  Substance Use Topics  . Alcohol use: No    CODE STATUS: DNR ADVANCED DIRECTIVES: No MOST FORM COMPLETE: No HOSPICE EDUCATION PROVIDED: No  PPS: : Patient is independent of ADL's. He ambulate short distances in his apartment and has an Transport planner to ambulate outside of his apartment, to and from meals/activities. Patient has to be reminded to put on his o2 as he is having increased forgetfulness.  Duration of visit and documentation 60 minutes     [name], LCSW

## 2019-11-12 ENCOUNTER — Other Ambulatory Visit: Payer: Medicare Other

## 2019-11-12 ENCOUNTER — Other Ambulatory Visit: Payer: Self-pay

## 2019-11-12 DIAGNOSIS — Z515 Encounter for palliative care: Secondary | ICD-10-CM

## 2019-11-15 NOTE — Progress Notes (Signed)
COMMUNITY PALLIATIVE CARE SW NOTE  PATIENT NAME: Lawrence Warner DOB: 1918-11-05 MRN: [id number]  PRIMARY CARE PROVIDER: Wenda Low, MD  RESPONSIBLE PARTY:  Acct ID - Guarantor Home Phone Work Phone Relationship Acct Type  [id number] - [name],[phone]  Self P/F     [address], [address], [place], [place] [address]     PLAN OF CARE and INTERVENTIONS:             1. GOALS OF CARE/ ADVANCE CARE PLANNING:  Goal is for patient to remain at the facility, as independent as possible. Patient is a DNR, form is in the home.  2. SOCIAL/EMOTIONAL/SPIRITUAL ASSESSMENT/ INTERVENTIONS:  SW completed a face-to-face visit with patient at the facility [facility]). He was laying across the bed with his feet hanging off the bed and with his 02 off. His room was also warm. SW observed swelling in his hands and feet. SW offered to assist him to get his feet on the bed, but he declined, stating he was about to get up. Patient did get up and sat up on the side of the bed. He appeared to be short of breath and allowed SW to assist him with putting his 02 on. He denied pain. He was engaged in open dialogue and life review through pictures. Patient report that his appetite remains good and he continues to go to the dinning room. He continues to take Ensure as a suppliement. His short term memory continues to decline. SW providedsupportive presence,active and supportive listening, reinforced safety interventions (wear 02, use assistive devices) while ambulating in apartment. SW will provide ongoing psychosocial assessment of needs, comfort and coping routinely.    3. PATIENT/CAREGIVER EDUCATION/ COPING:  Patient was pleasant, engaged and seems to be coping well.  4. PERSONAL EMERGENCY PLAN:  Per facility protocol.  5. COMMUNITY RESOURCES COORDINATION/ HEALTH CARE NAVIGATION:   Patient receiving medication reminders and assistance with personal care. 6. FINANCIAL/LEGAL CONCERNS/INTERVENTIONS:   None.     SOCIAL HX:  Social History   Tobacco Use  . Smoking status: Former Smoker    Packs/day: 2.00    Years: 25.00    Pack years: 50.00  . Smokeless tobacco: Never Used  Substance Use Topics  . Alcohol use: No    CODE STATUS: DNR ADVANCED DIRECTIVES: No MOST FORM COMPLETE: No HOSPICE EDUCATION PROVIDED: No  PPS: Patient is independent of ADL's. He ambulate short distances in his apartment and has an Transport planner to ambulate outside of his apartment, to and from meals/activities. Patient has to be reminded to put on his o2 as he is having increased forgetfulness.  Duration of visit and documentation 60 minutes       [name], LCSW

## 2019-11-19 DIAGNOSIS — Z23 Encounter for immunization: Secondary | ICD-10-CM | POA: Diagnosis not present

## 2019-12-10 ENCOUNTER — Other Ambulatory Visit: Payer: Self-pay

## 2019-12-10 ENCOUNTER — Other Ambulatory Visit: Payer: Medicare Other | Admitting: *Deleted

## 2019-12-10 VITALS — BP 133/63 | HR 56 | Temp 97.9°F | Resp 20

## 2019-12-10 DIAGNOSIS — Z515 Encounter for palliative care: Secondary | ICD-10-CM

## 2019-12-11 DIAGNOSIS — E119 Type 2 diabetes mellitus without complications: Secondary | ICD-10-CM | POA: Diagnosis not present

## 2019-12-11 DIAGNOSIS — Z9981 Dependence on supplemental oxygen: Secondary | ICD-10-CM | POA: Diagnosis not present

## 2019-12-11 DIAGNOSIS — I5022 Chronic systolic (congestive) heart failure: Secondary | ICD-10-CM | POA: Diagnosis not present

## 2019-12-11 DIAGNOSIS — I10 Essential (primary) hypertension: Secondary | ICD-10-CM | POA: Diagnosis not present

## 2019-12-11 DIAGNOSIS — Z9181 History of falling: Secondary | ICD-10-CM | POA: Diagnosis not present

## 2019-12-11 DIAGNOSIS — M6281 Muscle weakness (generalized): Secondary | ICD-10-CM | POA: Diagnosis not present

## 2019-12-27 NOTE — Progress Notes (Signed)
COMMUNITY PALLIATIVE CARE RN NOTE  PATIENT NAME: Lawrence Warner DOB: [birth year] MRN: [id number]  PRIMARY CARE PROVIDER: Wenda Low, MD  RESPONSIBLE PARTY:  Acct ID - Guarantor Home Phone Work Phone Relationship Acct Type  [id number] - [place],[phone]  Self P/F     [address], [address], [place], [place] [address]   Covid-19 Pre-screening Negative  PLAN OF CARE and INTERVENTION:  1. ADVANCE CARE PLANNING/GOALS OF CARE: Goal is for [place] to remain in [place] at [facility].  2. PATIENT/CAREGIVER EDUCATION: Symptom management, safe mobility/transfers, fall prevention, s/s of infection 3. DISEASE STATUS: Met with patient in his Independent living apartment. Upon my arrival, he was lying in bed about to take a nap. He had not too long returned from the dining hall from lunch. He reports pain in his right shoulder. He says that he lost his balance and fell last night in his apartment. He hit his right shoulder on the wall. He is able to lift his arm at his baseline (he did not have full ROM prior to this), but it is sore. He does not want a x-ray of his shoulder. He says he has been taking Tylenol which helps and that his son is aware of his fall.  He ambulates using a walker or a cane. But sometimes he just holds onto walls/furniture.  He was able to answer questions appropriately, and make his needs known. He was not as communicative today. He does experience some shortness of breath with exertion and occasionally at rest. He is oxygen dependent at 2L/min via . He goes to the dining hall for meals in his motorized chair and has been going regularly. He is able to shower and dress himself independently, but he does tire out easily. He continues with a caregiver who comes daily to make sure that he takes his medications from his pill box. Trace edema noted to bilateral lower extremities. He denies heaviness or tightness. He takes a nap after every meal and says that  he typically sleeps well during the night, but will have to get up to urinate at times. Will continue to monitor.   HISTORY OF PRESENT ILLNESS: This is a 86 yo male with a diagnosis of CHF. He has a h/o HTN, CAD, GERD, DM II and CKD Stage 3b.Palliative care continues to follow patient and visits monthly and PRN.    CODE STATUS: DNR  ADVANCED DIRECTIVES: Y MOST FORM: no PPS: 50%   PHYSICAL EXAM:   VITALS: Today's Vitals   12/10/19 1439  BP: 133/63  Pulse: (!) 56  Resp: 20  Temp: 97.9 F (36.6 C)  TempSrc: Temporal  SpO2: 96%  PainSc: 4   PainLoc: Shoulder    LUNGS: clear to auscultation  CARDIAC: Cor Loletha Grayer EXTREMITIES: Trace edema to bilateral lower extremities SKIN: Exposed skin is dry and intact; thin/frail skin that bruises easily  NEURO: Alert and oriented x 3, forgetful, increased generalized weakeness, HOH, ambulatory w/assistive device   (Duration of visit and documentation 45 minutes)   Lawrence Eastern, RN BSN

## 2019-12-31 IMAGING — CT CT ABD-PELV W/O CM
2 of 4 series · 16 of 46 positions shown, 18 images · non-contrast
Comparison: Abdominal ultrasound 10/06/2014

CLINICAL DATA: Abdominal distension.  Concern for diverticulitis.

EXAM:
CT ABDOMEN AND PELVIS WITHOUT CONTRAST
TECHNIQUE: Multidetector CT imaging of the abdomen and pelvis was performed
following the standard protocol without IV contrast.

[Series 4: a/p w/o 5mm · axial · non-contrast · 0.92mm/px · z∈[+836,+1256]mm · 13 of 92 slices shown, 15 images]
[im 4/92  soft-tissue]
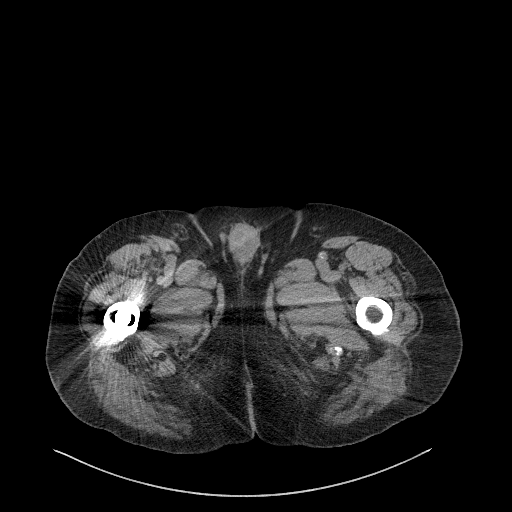
[im 4/92  bone]
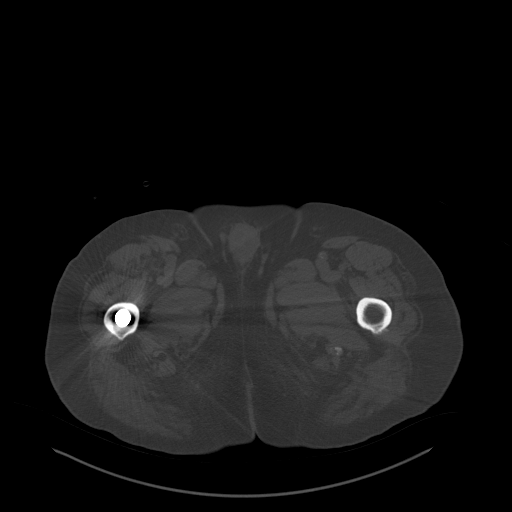
[im 11/92  soft-tissue]
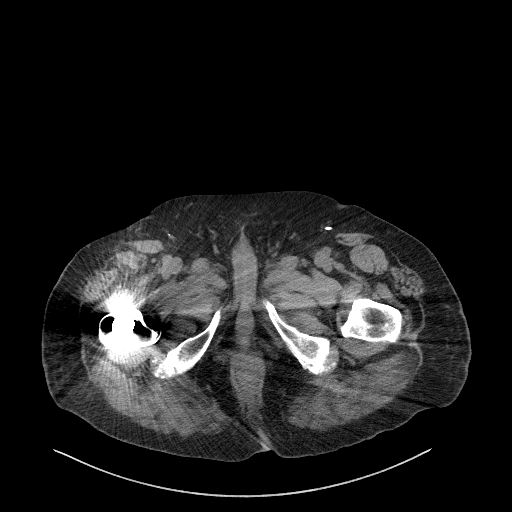
[im 18/92  soft-tissue]
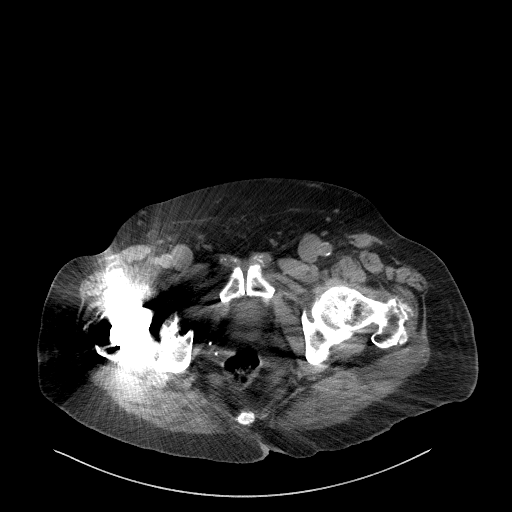
[im 25/92  soft-tissue]
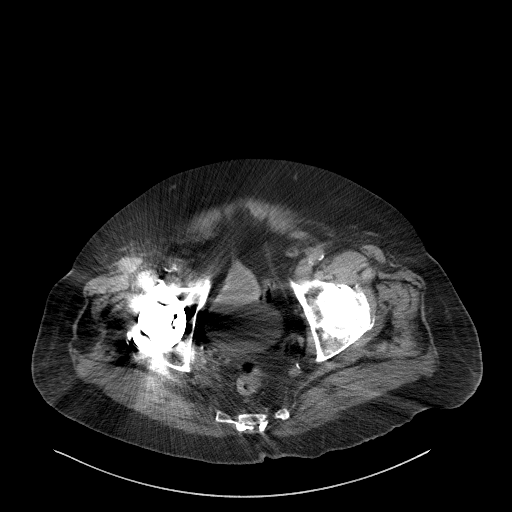
[im 32/92  soft-tissue]
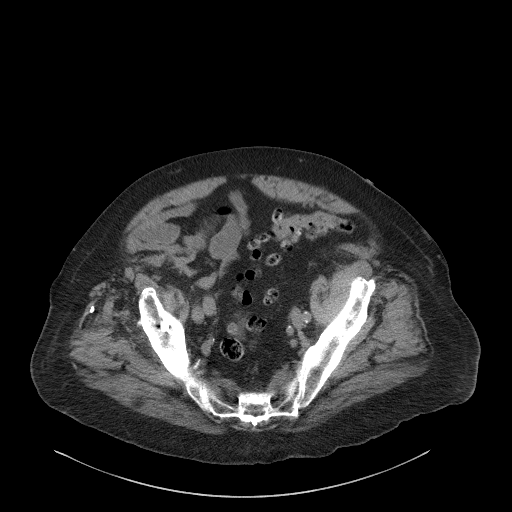
[im 39/92  soft-tissue]
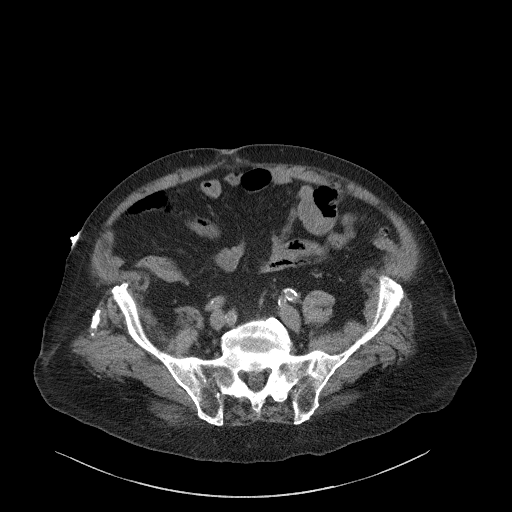
[im 46/92  soft-tissue]
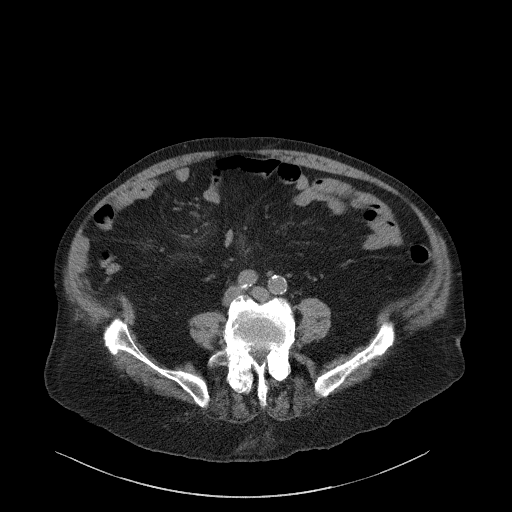
[im 53/92  soft-tissue]
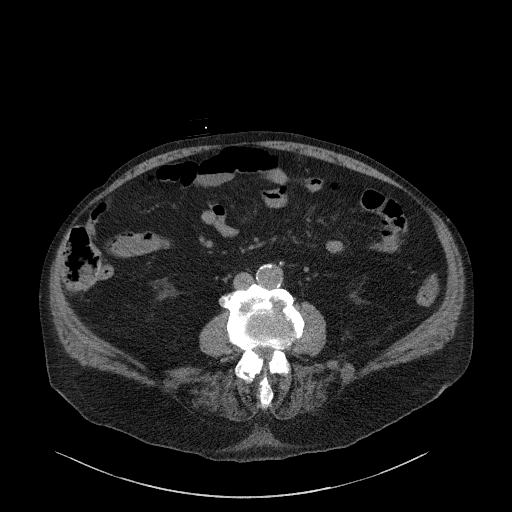
[im 60/92  soft-tissue]
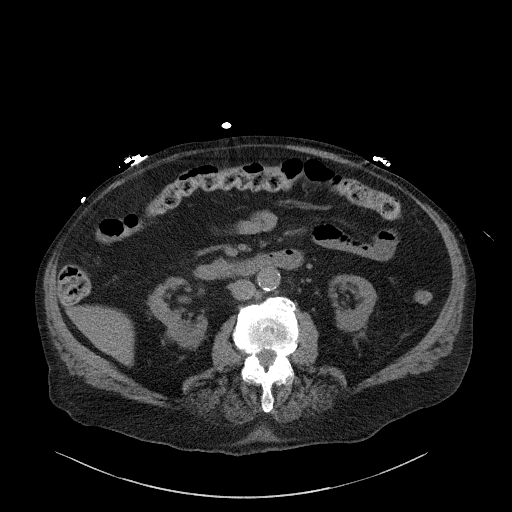
[im 60/92  bone]
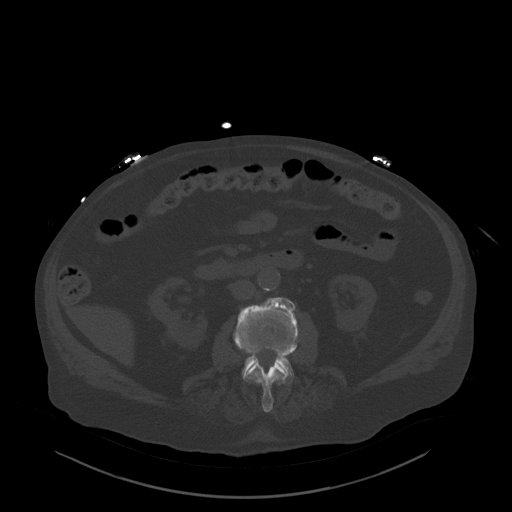
[im 67/92  soft-tissue]
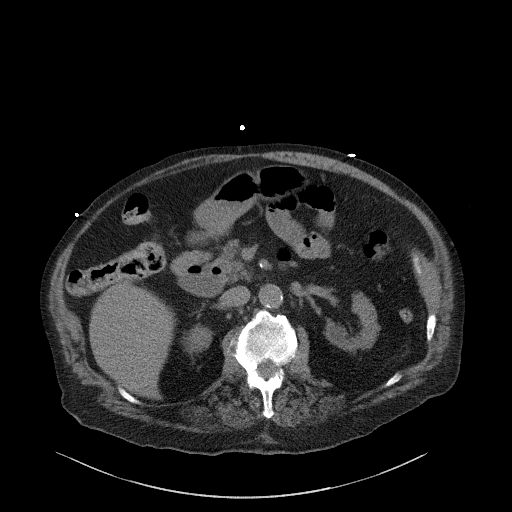
[im 74/92  soft-tissue]
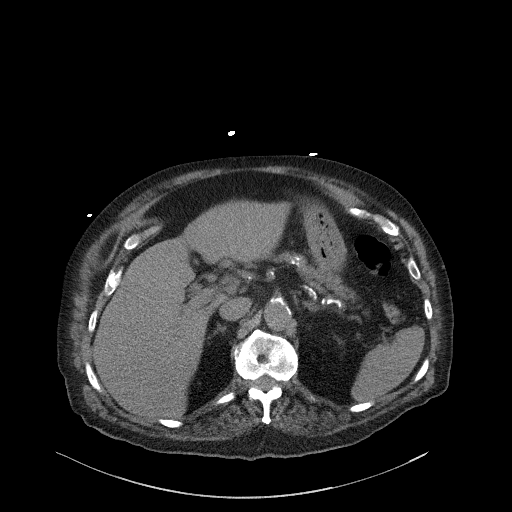
[im 81/92  soft-tissue]
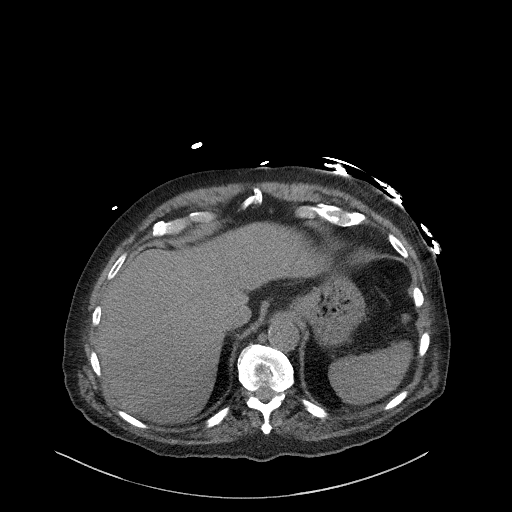
[im 88/92  soft-tissue]
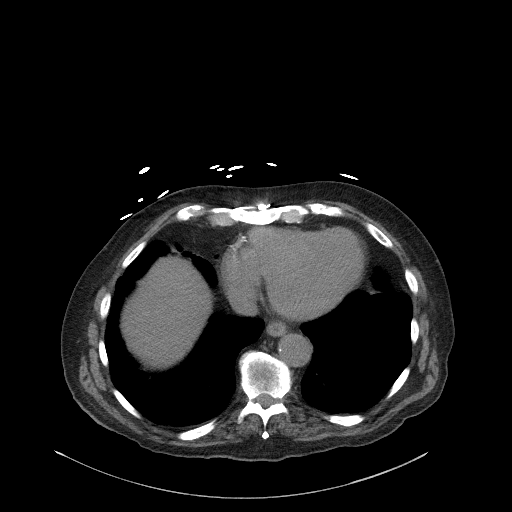

[Series 7: a/p w/o cor · coronal · non-contrast · 0.85mm/px · 3 of 158 slices shown]
[im 53/158  soft-tissue]
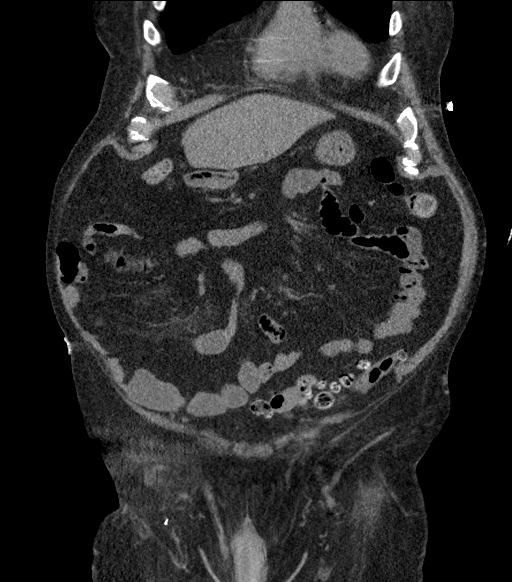
[im 70/158  soft-tissue]
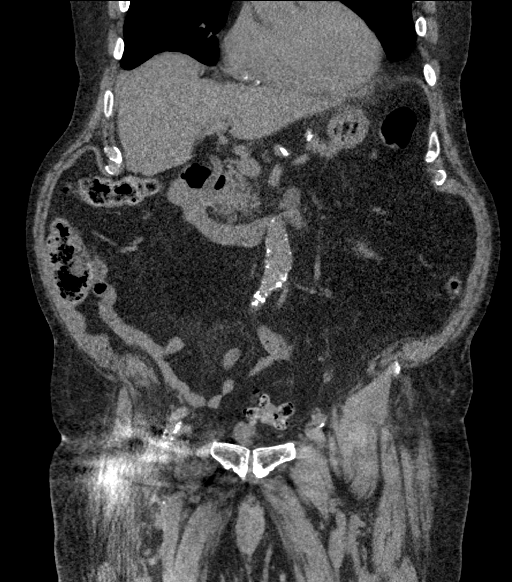
[im 88/158  soft-tissue]
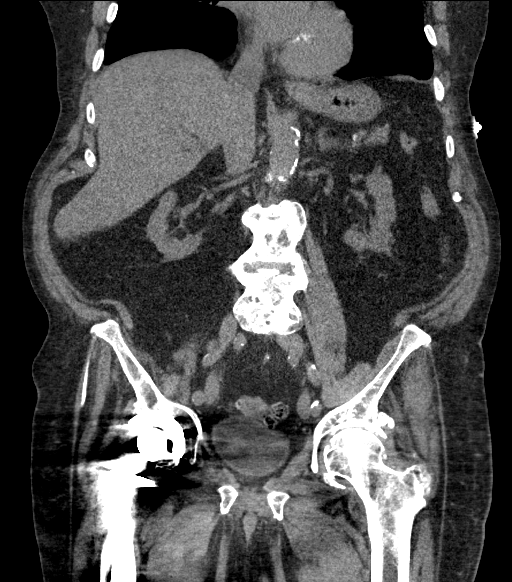

[16 of 46 positions shown; findings below may reference images not displayed]

FINDINGS: Lower chest: Mild scarring in the lung bases. Punctate calcified
granuloma in the right lower lobe. No pleural effusion. Coronary
artery atherosclerosis.

Hepatobiliary: Punctate calcified granuloma in the right hepatic
lobe. No significant biliary dilatation status post cholecystectomy.

Pancreas: Mild diffuse atrophy. No ductal dilatation or inflammatory
change.

Spleen: Unremarkable.

Adrenals/Urinary Tract: Unremarkable adrenal glands. Lobular renal
contour with cortical thinning bilaterally. 10 mm posterior left
lower pole renal cyst. No renal calculi or hydronephrosis. Grossly
unremarkable bladder within limitations of streak artifact.

Stomach/Bowel: The stomach is within normal limits. A moderate-sized
diverticulum is noted arising from the distal second portion of the
duodenum without surrounding inflammation. There is no evidence of
bowel obstruction. There is extensive sigmoid colon diverticulosis
without evidence of diverticulitis. The appendix is unremarkable.

Vascular/Lymphatic: Abdominal aortic atherosclerosis without
aneurysm. Atherosclerotic and minimally ectatic common iliac
arteries. No enlarged lymph nodes.

Reproductive: Unremarkable prostate.

Other: No intraperitoneal free fluid. Minimal nonspecific haziness
in the central mesentery without mass or fluid collection. Tiny fat
containing umbilical hernia.

Musculoskeletal: Right hip arthroplasty with associated streak
artifact. Diffuse flowing anterior vertebral ossification and
posterior element ankylosis in the lower thoracic and lumbar spine
extending to the sacrum. Partial ankylosis of both SI joints.
IMPRESSION: 1. No acute abnormality identified in the abdomen or pelvis.
2. Extensive sigmoid colon diverticulosis without diverticulitis.
3.  Aortic Atherosclerosis (R8E7C-JK2.2).

## 2020-01-03 ENCOUNTER — Telehealth: Payer: Self-pay | Admitting: *Deleted

## 2020-01-03 ENCOUNTER — Other Ambulatory Visit: Payer: Self-pay

## 2020-01-03 ENCOUNTER — Other Ambulatory Visit: Payer: Medicare Other

## 2020-01-03 DIAGNOSIS — Z515 Encounter for palliative care: Secondary | ICD-10-CM

## 2020-01-03 NOTE — Telephone Encounter (Signed)
Received a call from our [place], [name], to report that during her visit today, patient continues to c/o right shoulder pain from a fall he had a few weeks ago and wants to have a x-ray. I contacted and spoke to Glean Salvo, NP with [facility] (Dr. Daphene Jaeger) to advise. He faxed an order over to [facility] x-ray today and requested that I call to verify that they received it. I called [facility] and spoke with Hoyle Sauer who says they did receive the order, but has called and left a voicemail on patient's phone requesting a call back so he can answer the questionaire Covid questions so they can schedule. I advised that patient most likely would not return call but I would reach out to his son and daughter-in-law to call back. She says this is ok. I called the number listed for his son [name] [phone] and it keeps saying the number cannot be completed as dialed. I then called [facility] to let them know the situation, however no one is answering the phone and eventually the phone hangs up. Will continue to re-attempt calling in order to get x-ray scheduled.

## 2020-01-03 NOTE — Telephone Encounter (Signed)
Received a call from Glean Salvo, NP with Dr. Altamese Dilling office. Earlier today he ordered a x-ray of his right shoulder d/t ongoing pain s/p fall a few weeks ago per patient's request. [facility] was eventually able to confirm the Covid questions with patient and went out to the facility to perform the x-ray. When they got there, patient refused it stating that he no longer needed it, even after he told our [facility] SW that he wanted it and prior to her visit had even went to the in-house therapy department at [facility] to inquire about how to get a x-ray, but they were not in their office at the time. Will continue to monitor.

## 2020-01-04 NOTE — Progress Notes (Signed)
COMMUNITY PALLIATIVE CARE SW NOTE  PATIENT NAME: Lawrence Warner DOB: October 30, 1918 MRN: [id number]  PRIMARY CARE PROVIDER: Wenda Low, MD  RESPONSIBLE PARTY:  Acct ID - Guarantor Home Phone Work Phone Relationship Acct Type  [id number] - Borowski,[phone]  Self P/F     [address], [address], [place], [place] [address]     PLAN OF CARE and INTERVENTIONS:             1. GOALS OF CARE/ ADVANCE CARE PLANNING:  Goal is for patient to remain at the facility, as independent as possible. Patient is a DNR, form is in the home.  2. SOCIAL/EMOTIONAL/SPIRITUAL ASSESSMENT/ INTERVENTIONS:  SW completed a visit with patient at the facility, [facility]. Patient was in bed. His sitter-Tesha was present with him. Patient was away, laying on his side in bed. He was not wearing his o2. He reported that his shoulder was bothering him. He sat up, put his shoes on while holding onto the wall. He shuffled to the bathroom. SW observed swelling in his hands. When patient returned from the bathroom he sat in his recliner. Patient reported that he had a fall last month and his shoulder has been hurting since that time. He stated he used the heating pad a day or so, but did not get any relief. He also took a Tylenol while present. Patient stated that he needs an x-ray and tried to go to the therapy department to get one. SW explained to him that the therapy department could not provide an x-ray. SW advised him that she could have the RN request an x-ray form his doctor. Patient was agreeable to this plan. He had his sitter massage his neck and shoulders during this visit. Patient was engaged during this visit. He talked about this birthday celebration and grandchildren. He continues to go to the dinning room for meals and report that he has a good appetite. SW provided supportive presence, active listening, reinforced safety interventions. SW will provide ongoing psychosocial assessment of needs comfort and  coping routinely.  3. PATIENT/CAREGIVER EDUCATION/ COPING:  Patient was engaged. He seems to be coping well.  4. PERSONAL EMERGENCY PLAN:  Per facility protocol.  5. COMMUNITY RESOURCES COORDINATION/ HEALTH CARE NAVIGATION:  Patient is receiving medication reminders and assistance with personal care through [facility].  6. FINANCIAL/LEGAL CONCERNS/INTERVENTIONS:  None.     SOCIAL HX:  Social History   Tobacco Use   Smoking status: Former Smoker    Packs/day: 2.00    Years: 25.00    Pack years: 50.00   Smokeless tobacco: Never Used  Substance Use Topics   Alcohol use: No    CODE STATUS: DNR ADVANCED DIRECTIVES: No MOST FORM COMPLETE:  No HOSPICE EDUCATION PROVIDED: No  PPS: Patient is independent of ADL's. He ambulate short distances in his apartment and has an Transport planner to ambulate outside of his apartment, to and from meals/activities. Patient has to be reminded to put on his o2 as he is having increased forgetfulness.  Duration of visit and documentation 60 minutes       [name], LCSW

## 2020-01-19 DIAGNOSIS — Z0000 Encounter for general adult medical examination without abnormal findings: Secondary | ICD-10-CM | POA: Diagnosis not present

## 2020-01-19 DIAGNOSIS — Z7189 Other specified counseling: Secondary | ICD-10-CM | POA: Diagnosis not present

## 2020-02-02 ENCOUNTER — Ambulatory Visit: Payer: Medicare Other | Admitting: Dermatology

## 2020-02-09 ENCOUNTER — Encounter: Payer: Self-pay | Admitting: Dermatology

## 2020-02-09 ENCOUNTER — Other Ambulatory Visit: Payer: Self-pay

## 2020-02-09 ENCOUNTER — Ambulatory Visit (INDEPENDENT_AMBULATORY_CARE_PROVIDER_SITE_OTHER): Payer: Medicare Other | Admitting: Dermatology

## 2020-02-09 DIAGNOSIS — Z1283 Encounter for screening for malignant neoplasm of skin: Secondary | ICD-10-CM

## 2020-02-09 DIAGNOSIS — Z85828 Personal history of other malignant neoplasm of skin: Secondary | ICD-10-CM

## 2020-02-09 DIAGNOSIS — Z8589 Personal history of malignant neoplasm of other organs and systems: Secondary | ICD-10-CM

## 2020-02-12 ENCOUNTER — Encounter: Payer: Self-pay | Admitting: Dermatology

## 2020-02-12 NOTE — Progress Notes (Signed)
   Follow-Up Visit   Subjective  Lawrence Warner is a 86 y.o. male who presents for the following: Annual Exam (RIGHT FOREHEAD X 2 AND LEFT EAR NONHEALING).  Growths Location: Forehead and left ear Duration:  Quality:  Associated Signs/Symptoms: Modifying Factors:  Severity:  Timing: Context: Nonmelanoma skin cancer  Objective  Well appearing patient in no apparent distress; mood and affect are within normal limits. Objective  Chest - Medial [facility]): 2 superficial but focally eroded pink waxy crust right forehead, more heaped up hornlike 9 mm crust left antihelix.  LEFT FOREHEAD NONHEALING LESIONS LEFT EAR NONHEALING  TO WATCH    A focused examination was performed including Head and neck.. Relevant physical exam findings are noted in the Assessment and Plan.   Assessment & Plan    Screening exam for skin cancer Chest - Medial [facility])  Discussed with Mr. Postlewaite and his son [name] (present throughout visit) choices for these are more certain nonmelanoma skin cancers.  It makes no sense to obtain biopsies if the family would not choose to have surgical removal, most likely Mohs surgery for the lesion on the left ear.  They have decided to think this over before proceeding.  History of squamous cell carcinoma Head - Anterior (Face)  History of basal cell cancer Head - Anterior (Face)      I, Lavonna Monarch, MD, have reviewed all documentation for this visit.  The documentation on 02/12/20 for the exam, diagnosis, procedures, and orders are all accurate and complete.

## 2020-02-16 DIAGNOSIS — J449 Chronic obstructive pulmonary disease, unspecified: Secondary | ICD-10-CM | POA: Diagnosis not present

## 2020-02-16 DIAGNOSIS — E119 Type 2 diabetes mellitus without complications: Secondary | ICD-10-CM | POA: Diagnosis not present

## 2020-02-16 DIAGNOSIS — F419 Anxiety disorder, unspecified: Secondary | ICD-10-CM | POA: Diagnosis not present

## 2020-02-16 DIAGNOSIS — I10 Essential (primary) hypertension: Secondary | ICD-10-CM | POA: Diagnosis not present

## 2020-02-16 DIAGNOSIS — I5022 Chronic systolic (congestive) heart failure: Secondary | ICD-10-CM | POA: Diagnosis not present

## 2020-02-25 ENCOUNTER — Other Ambulatory Visit: Payer: Self-pay

## 2020-02-25 ENCOUNTER — Other Ambulatory Visit: Payer: Medicare Other | Admitting: *Deleted

## 2020-02-25 DIAGNOSIS — Z515 Encounter for palliative care: Secondary | ICD-10-CM

## 2020-02-25 NOTE — Progress Notes (Signed)
COMMUNITY PALLIATIVE CARE RN NOTE  PATIENT NAME: Lawrence Warner DOB: 16-Feb-1919 MRN: [id number]  PRIMARY CARE PROVIDER: Wenda Low, MD  RESPONSIBLE PARTY: Ervin Knack (son) Acct ID - Guarantor Home Phone Work Phone Relationship Acct Type  [id number] - Febo,[phone]  Self P/F     [address], [address], [place], [place] [address]   Covid-19 Pre-screening Negative  PLAN OF CARE and INTERVENTION:  1. ADVANCE CARE PLANNING/GOALS OF CARE: Goal is for patient to remain at [facility].  2. PATIENT/CAREGIVER EDUCATION: Symptom management, safe mobility/transfers 3. DISEASE STATUS: Met with patient in his independent living apartment. Upon arrival, patient is lying in bed but awake and alert. He says that he is about to start getting ready to go to lunch downstairs at the facility and sat up on the side of the bed during visit. He denies pain at this time. He does have intermittent pain in his left shoulder and neck. Tylenol remains effective when needed. He is oxygen dependent and does have it on this visit. He is able to answer questions and make his needs known. Breathing is regular and unlabored at this time. He does have some shortness of breath with exertion, but recovers fairly quickly while at rest. Due to severe stiffness in his neck, he has limited range of motion and can only make eye contact if sitting directly in front of him or by turning his entire body. He remains ambulatory using his rollator walker. No recent falls. He remains able to bathe, dress, feed and toilet himself. He has an aid that continues to come in the mornings daily to make sure he takes his medications and any other household needs. He continues to go to the dining hall for all meals using his motorized chair. He says his appetite is fair. He appears thinner overall with apparent muscle wasting in neck and chest region. He denies dysphagia. He continues to take naps after meals and says he  sleeps well at night. Will continue to monitor.  HISTORY OF PRESENT ILLNESS: This is a 86 yo male with a diagnosis of CHF. He has a h/o HTN, CAD, GERD, DM II and CKD Stage 3b.Palliative care continues to follow patient and visits monthly and PRN.    CODE STATUS: DNR ADVANCED DIRECTIVES: Y MOST FORM: no PPS: 50%   PHYSICAL EXAM:   LUNGS: clear to auscultation  CARDIAC: Cor RRR EXTREMITIES: Trace edema to lower extremities SKIN: Thin/frail skin, no skin breakdown  NEURO: Alert and oriented x 3, forgetful, HOH, generalized weakness, ambulatory w/rollator walker   (Duration of visit and documentation 45 minutes)   [name], RN BSN

## 2020-03-17 ENCOUNTER — Other Ambulatory Visit: Payer: Self-pay

## 2020-03-17 ENCOUNTER — Other Ambulatory Visit: Payer: Medicare Other

## 2020-03-17 DIAGNOSIS — Z515 Encounter for palliative care: Secondary | ICD-10-CM

## 2020-03-17 NOTE — Progress Notes (Signed)
COMMUNITY PALLIATIVE CARE SW NOTE  PATIENT NAME: Lawrence Warner DOB: 23-May-1918 MRN: [id number]  PRIMARY CARE PROVIDER: Wenda Low, MD  RESPONSIBLE PARTY:  Acct ID - Guarantor Home Phone Work Phone Relationship Acct Type  [id number] - Mataya,[phone]  Self P/F     [address], [address], [place], [place] [address]     PLAN OF CARE and INTERVENTIONS:             1. GOALS OF CARE/ ADVANCE CARE PLANNING:  Goal is for patient to remain at the facility, as independent as possible. Patient is a DNR, form is in the home. 1.  1. SOCIAL/EMOTIONAL/SPIRITUAL ASSESSMENT/ INTERVENTIONS:   SW completed a visit with patient at the facility, [facility]. Patient was in bed. He was not wearing his 02, but allowed SW to help him put it on. He did get up to his recliner to talk to SW. Patient denied pain. He reported that he was "doing well for an old man".  Patient denied any recent falls. He reports that he remains independent for his personal care needs. He states that he is supposed to still have a paid caregiver a few days a week, but did not remember having anyone for past couple of weeks. He goes to meals and have an electric scooter to assist him. Patient ambulates independently with a cane within his small apartment. He reports that his appetite is good and eats three meals daily. Patient did not appear to be in any distress. He was calm, cordial and engaged in life review regarding his children and lessons he learned as he grew older and matured. SW provided supportive presence, active listening, reinforced safety interventions. SW will provide ongoing psychosocial assessment of needs comfort and coping routinely 2.  2.  3. PATIENT/CAREGIVER EDUCATION/ COPING:  Patient appears to be coping well. He has a supportive family and has daily contact with them. He also as a good support network of peers and staff at the facility.  4. PERSONAL EMERGENCY PLAN:  Per facility protocol.   1. COMMUNITY RESOURCES COORDINATION/ HEALTH CARE NAVIGATION:  Patient is receiving medication reminders and assistance with personal care through [facility].  5.  6. FINANCIAL/LEGAL CONCERNS/INTERVENTIONS:  None.     SOCIAL HX:  Social History   Tobacco Use   Smoking status: Former Smoker    Packs/day: 2.00    Years: 25.00    Pack years: 50.00   Smokeless tobacco: Never Used  Substance Use Topics   Alcohol use: No    CODE STATUS: DNR ADVANCED DIRECTIVES: No MOST FORM COMPLETE:  No HOSPICE EDUCATION PROVIDED: No  PPS:  Patient ambulate short distances in his apartment and has an Transport planner to ambulate outside of his apartment, to and from meals/activities. Patient has to be reminded to put on his o2 as he is having increased forgetfulness.  Duration of visit and documentation 60 minutes       [name], LCSW

## 2020-04-06 ENCOUNTER — Other Ambulatory Visit: Payer: Self-pay

## 2020-04-06 ENCOUNTER — Other Ambulatory Visit: Payer: Medicare Other | Admitting: *Deleted

## 2020-04-06 VITALS — BP 126/78 | HR 55 | Temp 97.4°F | Resp 17

## 2020-04-06 DIAGNOSIS — Z515 Encounter for palliative care: Secondary | ICD-10-CM

## 2020-04-06 NOTE — Progress Notes (Signed)
COMMUNITY PALLIATIVE CARE RN NOTE  PATIENT NAME: Lawrence Warner DOB: [birth year] MRN: [id number]  PRIMARY CARE PROVIDER: Wenda Low, MD  RESPONSIBLE PARTY: Elmon Else (son) Acct ID - Guarantor Home Phone Work Phone Relationship Acct Type  [id number] - Rosencrans,[phone]  Self P/F     [address], [address], [place], [place] [address]   Covid-19 Pre-screening Negative  PLAN OF CARE and INTERVENTION:  1. ADVANCE CARE PLANNING/GOALS OF CARE: Goal is for patient to remain in [place] apartment at [facility]. He has a DNR. 2. PATIENT/CAREGIVER EDUCATION: Symptom management, safe mobility/transfers 3. DISEASE STATUS: Met with patient in his Independent living apartment. Upon arrival, he is lying in bed just waking up from a nap. He denies pain at this time. He is oxygen dependent on 2L/min via [place] continuously. He says he does become short of breath at times with exertion. He tires very easily and requires frequent rest periods. He is ambulatory using a cane. No recent falls. He uses a motorized chair when travelling outside of his apartment. He continues to attend all meals in the main dining hall. He appears thinner overall so not sure how much he is eating. Caregiver continues to visit daily in the mornings to make sure he takes his medications. He continues to take a nap after each meal and sleeps well throughout the night. He denies any concerns at this time. Will continue to monitor.   HISTORY OF PRESENT ILLNESS: This is a 87 yo male with a diagnosis of CHF. He has a h/o HTN, CAD, GERD, DM II and CKD Stage 3b.Palliative care continues to follow patient and visits monthly and PRN.    CODE STATUS: DNR  ADVANCED DIRECTIVES: Y MOST FORM: no PPS: 50%   PHYSICAL EXAM:   VITALS: Today's Vitals   04/06/20 1458  BP: 126/78  Pulse: (!) 55  Resp: 17  Temp: (!) 97.4 F (36.3 C)  TempSrc: Temporal  SpO2: 97%  PainSc: 0-No pain    LUNGS: clear to auscultation   CARDIAC: Cor Loletha Grayer EXTREMITIES: Trace edema noted to bilateral lower extremities SKIN: two small,raised scaly areas noted on forehead and left forearm; tan colored growth noted to right ear  NEURO: Alert and oriented x 2 (person/place), HOH, forgetful, generalized weakness, ambulatory w/cane    (Duration of visit and documentation 45 minutes)   [name], RN BSN

## 2020-04-21 DIAGNOSIS — M6281 Muscle weakness (generalized): Secondary | ICD-10-CM | POA: Diagnosis not present

## 2020-04-21 DIAGNOSIS — I10 Essential (primary) hypertension: Secondary | ICD-10-CM | POA: Diagnosis not present

## 2020-04-21 DIAGNOSIS — I5022 Chronic systolic (congestive) heart failure: Secondary | ICD-10-CM | POA: Diagnosis not present

## 2020-04-21 DIAGNOSIS — E119 Type 2 diabetes mellitus without complications: Secondary | ICD-10-CM | POA: Diagnosis not present

## 2020-04-21 DIAGNOSIS — J449 Chronic obstructive pulmonary disease, unspecified: Secondary | ICD-10-CM | POA: Diagnosis not present

## 2020-05-03 ENCOUNTER — Telehealth: Payer: Self-pay

## 2020-05-03 NOTE — Telephone Encounter (Signed)
(  1:31p) SW returned call to patient's daughter-in-law/PCG [name]. She advised that patient is showing increasing signs of dementia and is concern about his ability to continue to care for himself. She reported that his short-term memory is getting worse, which was more obvious at his [place] appointment yesterday. She report that patient was unable to discuss any current events. He has had three falls since December. SW provided information/resources for in-home care. SW also encouraged her to discuss additional care needs to check their availability. PCG has already discussed increased care needs to see if additional services are available-there was none. PCG request ongoing updates and support from palliative care team and reinforced access to support.

## 2020-05-09 ENCOUNTER — Other Ambulatory Visit: Payer: Self-pay

## 2020-05-09 ENCOUNTER — Telehealth: Payer: Self-pay

## 2020-05-09 ENCOUNTER — Other Ambulatory Visit: Payer: Medicare Other

## 2020-05-09 DIAGNOSIS — Z515 Encounter for palliative care: Secondary | ICD-10-CM

## 2020-05-09 NOTE — Telephone Encounter (Signed)
(  4:40p) SW completed a follow-up call with patient's PCG/daughter-in-law to provide an update on patient status and visit status.

## 2020-05-14 NOTE — Progress Notes (Signed)
COMMUNITY PALLIATIVE CARE SW NOTE  PATIENT NAME: Lawrence Warner DOB: 09-12-1918 MRN: [id number]  PRIMARY CARE PROVIDER: Wenda Low, MD  RESPONSIBLE PARTY:  Acct ID - Guarantor Home Phone Work Phone Relationship Acct Type  [id number] - Bergeron,[phone]  Self P/F     [address], [address], [place], [place] [address]     PLAN OF CARE and INTERVENTIONS:             1. GOALS OF CARE/ ADVANCE CARE PLANNING:  Goal is for patient to remain at the facility, as independent as possible. Patient is a DNR.  2. SOCIAL/EMOTIONAL/SPIRITUAL ASSESSMENT/ INTERVENTIONS:  SW and SW intern-C. Hassell Done completed a face-to-face visit with patient at his independent apartment. He was sitting on the side of the bed, with his o2. He denied pain. Patient appears thinner. However he continues to go down to the dinning room for three meals and he report no change in his appetite. He is eating 75-100% of meals. He denied any falls. He has increased ADL assistance during the week, to include medication and meal reminders. He remains independent for personal care. He continues to ambulate with his walker or can inside his apartment, but utilizes his Transport planner. Patient was humorous throughout the visit as he reflected on his past.experiences. SW provided supportive presence, active listening, assessment of needs, comfort and safety. SW provided a telephone update to his PCG-Eileen. She report increased cognitive deficits. She and her husband will remain monitoring these changes to determine if a higher level of care or increased personal care is needed.    3. PATIENT/CAREGIVER EDUCATION/ COPING:  Patient appears to be coping well. He has a good supportive network of peers and family.  4. PERSONAL EMERGENCY PLAN:  Per facility protocol.  5. COMMUNITY RESOURCES COORDINATION/ HEALTH CARE NAVIGATION:  Patient receiving personal care assistance through [facility]. 6. FINANCIAL/LEGAL CONCERNS/INTERVENTIONS:   None.     SOCIAL HX:  Social History   Tobacco Use  . Smoking status: Former Smoker    Packs/day: 2.00    Years: 25.00    Pack years: 50.00  . Smokeless tobacco: Never Used  Substance Use Topics  . Alcohol use: No    CODE STATUS: DNR ADVANCED DIRECTIVES: No MOST FORM COMPLETE:  No HOSPICE EDUCATION PROVIDED: No  PPS: Patient ambulate short distances in his apartment and has an electric scooter to ambulate outside of his apartment, to and from meals/activities. Patient has to be reminded to put on his o2 as he is having increased forgetfulness.  Duration of visit and documentation 60 minutes      [name], LCSW

## 2020-05-18 DIAGNOSIS — E119 Type 2 diabetes mellitus without complications: Secondary | ICD-10-CM | POA: Diagnosis not present

## 2020-05-18 DIAGNOSIS — Z9981 Dependence on supplemental oxygen: Secondary | ICD-10-CM | POA: Diagnosis not present

## 2020-05-18 DIAGNOSIS — I5022 Chronic systolic (congestive) heart failure: Secondary | ICD-10-CM | POA: Diagnosis not present

## 2020-05-18 DIAGNOSIS — I10 Essential (primary) hypertension: Secondary | ICD-10-CM | POA: Diagnosis not present

## 2020-06-02 ENCOUNTER — Other Ambulatory Visit: Payer: Medicare Other

## 2020-06-02 ENCOUNTER — Other Ambulatory Visit: Payer: Self-pay

## 2020-06-02 DIAGNOSIS — Z515 Encounter for palliative care: Secondary | ICD-10-CM

## 2020-06-14 DIAGNOSIS — I5022 Chronic systolic (congestive) heart failure: Secondary | ICD-10-CM | POA: Diagnosis not present

## 2020-06-14 DIAGNOSIS — M6281 Muscle weakness (generalized): Secondary | ICD-10-CM | POA: Diagnosis not present

## 2020-06-14 DIAGNOSIS — I10 Essential (primary) hypertension: Secondary | ICD-10-CM | POA: Diagnosis not present

## 2020-06-14 DIAGNOSIS — F419 Anxiety disorder, unspecified: Secondary | ICD-10-CM | POA: Diagnosis not present

## 2020-06-14 DIAGNOSIS — R0902 Hypoxemia: Secondary | ICD-10-CM | POA: Diagnosis not present

## 2020-06-26 NOTE — Progress Notes (Signed)
COMMUNITY PALLIATIVE CARE SW NOTE  PATIENT NAME: Lawrence Warner DOB: 1918/12/04 MRN: [id number]  PRIMARY CARE PROVIDER: Wenda Low, MD  RESPONSIBLE PARTY:  Acct ID - Guarantor Home Phone Work Phone Relationship Acct Type  [id number] - Miyasaki,[id number]  Self P/F     [address], [address], [place], [place] [address]     PLAN OF CARE and INTERVENTIONS:             1. GOALS OF CARE/ ADVANCE CARE PLANNING:  [place] patient to remain at the facility. Patient is a DNR.  2. SOCIAL/EMOTIONAL/SPIRITUAL ASSESSMENT/ INTERVENTIONS:  SW found patient in the dinning room, preparing for lunch. He reported that he was doing well and denied having any pain. Patient seemed to be more humorous as he and his peer were telling jokes. Patient was clean and appropriately dressed. He continues to have ADL assistance 1-2x week, but is independent for most personal care needs. He gait remains unsteady. He ambulates in his apartment by holding onto the wall and furniture and then uses his electric scooter to go down for meals. Patient's appetite remains good. SW provided a supportive presence, active listening, assessment of needs and comfort. He remains open to ongoing palliative care support.  3. PATIENT/CAREGIVER EDUCATION/ COPING:  Patient report that he is coping well.  4. PERSONAL EMERGENCY PLAN:  Per facility protocol.  5. COMMUNITY RESOURCES COORDINATION/ HEALTH CARE NAVIGATION:  Patient has a receiving persona care assistance through [employer] 1-2x week.  6. FINANCIAL/LEGAL CONCERNS/INTERVENTIONS:  None.     SOCIAL HX:  Social History   Tobacco Use  . Smoking status: Former Smoker    Packs/day: 2.00    Years: 25.00    Pack years: 50.00  . Smokeless tobacco: Never Used  Substance Use Topics  . Alcohol use: No    CODE STATUS: DNR ADVANCED DIRECTIVES: No MOST FORM COMPLETE: No HOSPICE EDUCATION PROVIDED: No  PPS: Patient ambulate short distances in his apartment and has an  electric scooter to ambulate outside of his apartment, to and from meals/activities. Patient has to be reminded to put on his o2 as, ongoing increased forgetfulness.  Duration of visit and documentation 45 minutes       [name], LCSW

## 2020-06-30 ENCOUNTER — Other Ambulatory Visit: Payer: Self-pay

## 2020-06-30 ENCOUNTER — Other Ambulatory Visit: Payer: Medicare Other

## 2020-06-30 DIAGNOSIS — Z515 Encounter for palliative care: Secondary | ICD-10-CM

## 2020-07-14 DIAGNOSIS — F419 Anxiety disorder, unspecified: Secondary | ICD-10-CM | POA: Diagnosis not present

## 2020-07-14 DIAGNOSIS — J449 Chronic obstructive pulmonary disease, unspecified: Secondary | ICD-10-CM | POA: Diagnosis not present

## 2020-07-14 DIAGNOSIS — C4491 Basal cell carcinoma of skin, unspecified: Secondary | ICD-10-CM | POA: Diagnosis not present

## 2020-07-14 DIAGNOSIS — I10 Essential (primary) hypertension: Secondary | ICD-10-CM | POA: Diagnosis not present

## 2020-07-18 NOTE — Progress Notes (Signed)
COMMUNITY PALLIATIVE CARE SW NOTE  PATIENT NAME: Lawrence Warner DOB: 08-05-1918 MRN: [id number]  PRIMARY CARE PROVIDER: Wenda Low, MD  RESPONSIBLE PARTY:  Acct ID - Guarantor Home Phone Work Phone Relationship Acct Type  [id number] - Prashad,[phone]  Self P/F     [address], [address], [place], [place] [address]     PLAN OF CARE and INTERVENTIONS:             1. GOALS OF CARE/ ADVANCE CARE PLANNING:  Goal is for patient to remain at the facility. Patient is a DNR. 2. SOCIAL/EMOTIONAL/SPIRITUAL ASSESSMENT/ INTERVENTIONS:  SW visited with patient at the facility. Patient was up in his recliner awake and alert. He denied pain. He was alert and oriented x3 and engaged. He reported that he has been doing well. He denied any falls. He does have a sore on his head that he report that this is ongoing. He remains independent for personal care needs, along with having a personal care aide. He continues to go to meals independently. He reports that his appetite remains good and his weight seems to be steady. He engaged in life review with some humor. He talked about his siblings and his only sister that is still alive. SW provided supportive presence, active listening and reflective listening, and reinforced safety, while assessing her needs and comfort of patient. He remains open to palliative care support.  3. PATIENT/CAREGIVER EDUCATION/ COPING:  Patient appears to be coping well. Patient has a supportive family and peer network.  4. PERSONAL EMERGENCY PLAN:  Per facility protocol.  5. COMMUNITY RESOURCES COORDINATION/ HEALTH CARE NAVIGATION:  Patient receive personal care assistance through [employer] 1x week.  6. FINANCIAL/LEGAL CONCERNS/INTERVENTIONS:  None.     SOCIAL HX:  Social History   Tobacco Use  . Smoking status: Former Smoker    Packs/day: 2.00    Years: 25.00    Pack years: 50.00  . Smokeless tobacco: Never Used  Substance Use Topics  . Alcohol use: No     CODE STATUS: DNR ADVANCED DIRECTIVES: No MOST FORM COMPLETE:  No HOSPICE EDUCATION PROVIDED: No  PPS: Patient ambulate short distances in his apartment and has an electric scooter to ambulate outside of his apartment, to and from meals/activities. Patient has to be reminded to put on his o2 as, ongoing increased forgetfulness.  Duration of visit and documentation 45 minutes      [name], LCSW

## 2020-08-09 ENCOUNTER — Emergency Department (HOSPITAL_COMMUNITY): Payer: Medicare Other

## 2020-08-09 ENCOUNTER — Emergency Department (HOSPITAL_COMMUNITY)
Admission: EM | Admit: 2020-08-09 | Discharge: 2020-08-09 | Disposition: A | Discharge status: Home / Self Care | Payer: Medicare Other | Attending: Emergency Medicine | Admitting: Emergency Medicine

## 2020-08-09 ENCOUNTER — Telehealth: Payer: Self-pay | Admitting: *Deleted

## 2020-08-09 ENCOUNTER — Other Ambulatory Visit: Payer: Self-pay

## 2020-08-09 DIAGNOSIS — I4430 Unspecified atrioventricular block: Secondary | ICD-10-CM | POA: Diagnosis not present

## 2020-08-09 DIAGNOSIS — Z951 Presence of aortocoronary bypass graft: Secondary | ICD-10-CM | POA: Insufficient documentation

## 2020-08-09 DIAGNOSIS — N1832 Chronic kidney disease, stage 3b: Secondary | ICD-10-CM | POA: Insufficient documentation

## 2020-08-09 DIAGNOSIS — I2510 Atherosclerotic heart disease of native coronary artery without angina pectoris: Secondary | ICD-10-CM | POA: Insufficient documentation

## 2020-08-09 DIAGNOSIS — R0602 Shortness of breath: Secondary | ICD-10-CM | POA: Insufficient documentation

## 2020-08-09 DIAGNOSIS — I5032 Chronic diastolic (congestive) heart failure: Secondary | ICD-10-CM

## 2020-08-09 DIAGNOSIS — I5043 Acute on chronic combined systolic (congestive) and diastolic (congestive) heart failure: Secondary | ICD-10-CM | POA: Insufficient documentation

## 2020-08-09 DIAGNOSIS — I509 Heart failure, unspecified: Secondary | ICD-10-CM

## 2020-08-09 DIAGNOSIS — E1140 Type 2 diabetes mellitus with diabetic neuropathy, unspecified: Secondary | ICD-10-CM | POA: Insufficient documentation

## 2020-08-09 DIAGNOSIS — I130 Hypertensive heart and chronic kidney disease with heart failure and stage 1 through stage 4 chronic kidney disease, or unspecified chronic kidney disease: Secondary | ICD-10-CM | POA: Insufficient documentation

## 2020-08-09 DIAGNOSIS — Z87891 Personal history of nicotine dependence: Secondary | ICD-10-CM | POA: Diagnosis not present

## 2020-08-09 DIAGNOSIS — J9621 Acute and chronic respiratory failure with hypoxia: Secondary | ICD-10-CM | POA: Diagnosis not present

## 2020-08-09 DIAGNOSIS — I491 Atrial premature depolarization: Secondary | ICD-10-CM | POA: Diagnosis not present

## 2020-08-09 DIAGNOSIS — E1122 Type 2 diabetes mellitus with diabetic chronic kidney disease: Secondary | ICD-10-CM | POA: Diagnosis not present

## 2020-08-09 DIAGNOSIS — I499 Cardiac arrhythmia, unspecified: Secondary | ICD-10-CM | POA: Diagnosis present

## 2020-08-09 DIAGNOSIS — I10 Essential (primary) hypertension: Secondary | ICD-10-CM

## 2020-08-09 DIAGNOSIS — R001 Bradycardia, unspecified: Secondary | ICD-10-CM | POA: Diagnosis not present

## 2020-08-09 DIAGNOSIS — N184 Chronic kidney disease, stage 4 (severe): Secondary | ICD-10-CM | POA: Diagnosis not present

## 2020-08-09 DIAGNOSIS — E119 Type 2 diabetes mellitus without complications: Secondary | ICD-10-CM | POA: Diagnosis not present

## 2020-08-09 DIAGNOSIS — I447 Left bundle-branch block, unspecified: Secondary | ICD-10-CM | POA: Diagnosis not present

## 2020-08-09 DIAGNOSIS — N179 Acute kidney failure, unspecified: Secondary | ICD-10-CM | POA: Diagnosis present

## 2020-08-09 DIAGNOSIS — I5030 Unspecified diastolic (congestive) heart failure: Secondary | ICD-10-CM | POA: Diagnosis present

## 2020-08-09 DIAGNOSIS — Z96641 Presence of right artificial hip joint: Secondary | ICD-10-CM | POA: Insufficient documentation

## 2020-08-09 DIAGNOSIS — N189 Chronic kidney disease, unspecified: Secondary | ICD-10-CM | POA: Diagnosis present

## 2020-08-09 DIAGNOSIS — J690 Pneumonitis due to inhalation of food and vomit: Secondary | ICD-10-CM | POA: Diagnosis not present

## 2020-08-09 DIAGNOSIS — I440 Atrioventricular block, first degree: Secondary | ICD-10-CM | POA: Diagnosis not present

## 2020-08-09 LAB — COMPREHENSIVE METABOLIC PANEL
ALT: 8 U/L (ref 0–44)
AST: 14 U/L — ABNORMAL LOW (ref 15–41)
Albumin: 2.7 g/dL — ABNORMAL LOW (ref 3.5–5.0)
Alkaline Phosphatase: 46 U/L (ref 38–126)
Anion gap: 9 (ref 5–15)
BUN: 47 mg/dL — ABNORMAL HIGH (ref 8–23)
CO2: 25 mmol/L (ref 22–32)
Calcium: 8.5 mg/dL — ABNORMAL LOW (ref 8.9–10.3)
Chloride: 108 mmol/L (ref 98–111)
Creatinine, Ser: 1.62 mg/dL — ABNORMAL HIGH (ref 0.61–1.24)
GFR, Estimated: 38 mL/min — ABNORMAL LOW (ref >60)
Glucose, Bld: 105 mg/dL — ABNORMAL HIGH (ref 70–99)
Potassium: 4.1 mmol/L (ref 3.5–5.1)
Sodium: 142 mmol/L (ref 135–145)
Total Bilirubin: 0.8 mg/dL (ref 0.3–1.2)
Total Protein: 6.1 g/dL — ABNORMAL LOW (ref 6.5–8.1)

## 2020-08-09 LAB — CBC WITH DIFFERENTIAL/PLATELET
Abs Immature Granulocytes: 0.03 10*3/uL (ref 0.00–0.07)
Basophils Absolute: 0 10*3/uL (ref 0.0–0.1)
Basophils Relative: 0 %
Eosinophils Absolute: 0.1 10*3/uL (ref 0.0–0.5)
Eosinophils Relative: 2 %
HCT: 34.3 % — ABNORMAL LOW (ref 39.0–52.0)
Hemoglobin: 10.8 g/dL — ABNORMAL LOW (ref 13.0–17.0)
Immature Granulocytes: 1 %
Lymphocytes Relative: 20 %
Lymphs Abs: 1.1 10*3/uL (ref 0.7–4.0)
MCH: 29.3 pg (ref 26.0–34.0)
MCHC: 31.5 g/dL (ref 30.0–36.0)
MCV: 93 fL (ref 80.0–100.0)
Monocytes Absolute: 0.5 10*3/uL (ref 0.1–1.0)
Monocytes Relative: 10 %
Neutro Abs: 3.9 10*3/uL (ref 1.7–7.7)
Neutrophils Relative %: 67 %
Platelets: 122 10*3/uL — ABNORMAL LOW (ref 150–400)
RBC: 3.69 MIL/uL — ABNORMAL LOW (ref 4.22–5.81)
RDW: 13.5 % (ref 11.5–15.5)
WBC: 5.7 10*3/uL (ref 4.0–10.5)
nRBC: 0 % (ref 0.0–0.2)

## 2020-08-09 LAB — MAGNESIUM: Magnesium: 2 mg/dL (ref 1.7–2.4)

## 2020-08-09 LAB — TROPONIN I (HIGH SENSITIVITY)
Troponin I (High Sensitivity): 27 ng/L — ABNORMAL HIGH (ref <18)
Troponin I (High Sensitivity): 27 ng/L — ABNORMAL HIGH (ref <18)

## 2020-08-09 LAB — BRAIN NATRIURETIC PEPTIDE: B Natriuretic Peptide: 302.9 pg/mL — ABNORMAL HIGH (ref 0.0–100.0)

## 2020-08-09 MED ORDER — FUROSEMIDE 10 MG/ML IJ SOLN
40.0000 mg | Freq: Once | INTRAMUSCULAR | Status: AC
  Administered 2020-08-09: 40 mg via INTRAVENOUS
  Filled 2020-08-09: qty 4

## 2020-08-09 MED ORDER — CEPHALEXIN 500 MG PO CAPS: 500.0000 mg | ORAL_CAPSULE | Freq: Three times a day (TID) | ORAL | 0 refills | Status: DC

## 2020-08-09 MED ORDER — CEPHALEXIN 250 MG PO CAPS
500.0000 mg | ORAL_CAPSULE | Freq: Once | ORAL | Status: AC
  Administered 2020-08-09: 500 mg via ORAL
  Filled 2020-08-09: qty 2

## 2020-08-09 MED ORDER — GUAIFENESIN ER 600 MG PO TB12
600.0000 mg | ORAL_TABLET | Freq: Two times a day (BID) | ORAL | Status: DC
  Administered 2020-08-09: 600 mg via ORAL
  Filled 2020-08-09: qty 1

## 2020-08-09 NOTE — Discharge Planning (Signed)
Pt currently active with [facility] for [place] services as confirmed by [facility] with Tommi Rumps of [facility].  Pt will resume [place] services of RN/NT.   DME needs identified at this time include oxygen.  Pt utilizes [facility] of oxygen needs.  RNCM spoke with Enzo Montgomery to have portable oxygen brought to hospital for transportation home.

## 2020-08-09 NOTE — Consult Note (Signed)
[facility] Medical Consultation  Lawrence Warner ASN:[id number] DOB: April 13, 1918 DOA: 08/09/2020 PCP: Wenda Low, MD   Requesting physician: Merrily Pew, MD Date of consultation: 08/09/2020 Reason for consultation: Dyspnea  Impression/Recommendations    Acute on chronic respiratory failure with hypoxia secondary to suspected aspiration pneumonia: Patient presents with complaints of dyspnea.  Denied any complaints of cough.  Chest x-ray significant for infiltrates on the right lower lobe of the lung.  O2 saturations currently maintained on home 2 L of oxygen.  His portable oxygen had recently stopped working.  He is otherwise afebrile and white blood cell count is within normal limits.  Would favor patient likely aspirating as a cause of symptoms.  However, given patient's age would not see any significant benefit in putting patient through swallow evaluation which could suggest patient be on a modified diet when he does not eat much at baseline. -Encouraged aspiration precautions -Would consider treatment with a short course of Keflex -Mucinex  Arrhythmia bradycardia:In route with EMS patient was noted to be in [place] and was noted to have heart rates in the low 40s while resting.  Discussed this with who also agree that would not benefit from any invasive procedures.  Elevated troponin: Acute.  High-sensitivity troponins 27->27 and otherwise thought to be flat.  Patient denies any complaints of chest pain.  Would suspect secondary to demand.  No further work-up warranted.  HFpEF: Chronic.  On exam patient was noted to have some trace lower extremity edema but no significant JVD.  Labs significant for BMP 302.9.  He does not appear to be grossly fluid overloaded and his chest x-ray showed no significant edema.  Patient had been given Lasix 40 mg IV x1 dose. -Continue home dose of Lasix starting tomorrowEssential hypertension: Blood pressures initially elevated up to 199/55.   After Lasix given blood pressures appear to be improving 169/51. -Continue losartan and furosemide  Chronic kidney disease stage IIIb: Stable.  Normocytic anemia: Hemoglobin 10.8 g/dL, previously 11.4 g/dL back in 12/2018.  Patient does not report any complaints of bleeding. -Recommend repeat CBC in 1 week.  Diabetes mellitus type 2: Diet controlled.  On admission glucose 105. -Continue to monitor  Protein calorie malnutrition: Chronic.  Albumin 2.7 on admission which appears near patient's baseline. -Encourage protein supplement such as boost or Ensure  Skin lesion: Patient with a skin lesion of the forehead. -Recommend outpatient follow-up with dermatology if not already done  DNR: Present on admission.  I will followup again tomorrow. Please contact me if I can be of assistance in the meanwhile. Thank you for this consultation.  Chief Complaint: Shortness of breath  HPI:  Lawrence Warner is a 87 y.o. male with medical history significant of HTN, HLD, diet-controlled DM type II, CAD, HFpEF last EF 50-55%, skin cancer s/p multiple treatments presents with complaints of shortness of breath.  History is obtained from the patient, but limited given patient's age.  Additional history obtained from patient's sons over the phone.  He complained of having increased shortness of breath overnight for which EMS.  He denies having any significant cough, fever, or chest pain.  Patient denies getting choked up on food and eating.  Patient also states that he would like to go home.  He lives in assisted living at [facility] and gets around with use of a motorized wheelchair, but does not have 24-hour care.  His son that lives in [place] spoke with him yesterday around 5 PM and stated that he seemed to  be doing fine.  He does note that the patient's portable oxygen had recently stopped working which may be at least in part to blame.  His son who lives here in [place] notes that they had scheduled  [facility] to come out today.  Reportedly only eats what he needs to get by.  Patient has a DNR on file.  In route with EMS patient's EKG initially showed Wenckebach and then went back into a normal sinus rhythm.  In the emergency department patient was seen to be afebrile with heart rates 40-60s, blood pressures 124/111-199/55, and O2 saturation maintained on home 2 L of oxygen.  Labs revealed WBC 5.7, hemoglobin 10.8, platelets 122, BUN 47, creatinine 1.62, albumin 2.7, BNP 302.9, high-sensitivity troponins 27->27.  Chest x-ray showed a right patchy perihilar and basilar opacities.   Review of Systems  Unable to perform ROS: Age  Respiratory:  Positive for shortness of breath. Negative for cough.   Cardiovascular:  Negative for chest pain and leg swelling.  Gastrointestinal:  Negative for blood in stool and vomiting.    Past Medical History:  Diagnosis Date   Basal cell carcinoma 10/23/2017   LEFT TEMPLE CX3 5FU   BCC (basal cell carcinoma of skin)    BPH (benign prostatic hypertrophy)    Chronic heart failure (HCC)    Chronic renal insufficiency    Coronary artery disease    CABG in 2000   Diabetes type 2, controlled ([place])    Diet-controlled   Diabetic neuropathy (HCC)    Dyslipidemia    Hypertension    ITP (idiopathic thrombocytopenic purpura)    Osteoarthritis    Pneumonia    SCC (squamous cell carcinoma) 10/23/2017   RIGHT SIDEBURN TX=CX3 5FU CAUTERY   SCC (squamous cell carcinoma) 04/23/2018   RIGHT JAWLINE TX=EXC   Squamous cell carcinoma of skin 08/17/2015   LEFT HAND CX3 5FU CAUTERY   Past Surgical History:  Procedure Laterality Date   CHOLECYSTECTOMY     CORONARY ARTERY BYPASS GRAFT  2000   Right hip replacement  2014   TONSILLECTOMY     Social History:  reports that he has quit smoking. He has a 50.00 pack-year smoking history. He has never used smokeless tobacco. He reports that he does not drink alcohol and does not use drugs.  Allergies  Allergen Reactions    Adenosine     Other reaction(s): Heart block   Alfuzosin Hcl Er Other (See Comments)    Confusion/Uroxatral    Ciprofloxacin Nausea Only    Other reaction(s): Nausea, Dizziness, Blurring of visual image, Nausea, Dizziness, Blurring of visual image   Imdur [Isosorbide Dinitrate] Other (See Comments)    Unknown reaction   Lisinopril     Other reaction(s): COUGHING   Niacin     Other reaction(s): Muscle pain   Penicillins Hives and Other (See Comments)    Has patient had a PCN reaction causing immediate rash, facial/tongue/throat swelling, SOB or lightheadedness with hypotension: Yes Has patient had a PCN reaction causing severe rash involving mucus membranes or skin necrosis: Unknown Has patient had a PCN reaction that required hospitalization: Unknown Has patient had a PCN reaction occurring within the last 10 years: Unknown If all of the above answers are "NO", then may proceed with Cephalosporin use.    Tape Other (See Comments)    SKIN IS VERY THIN AND WILL TEAR EASILY   Tuberculin Purified Protein Derivative Hives and Nausea And Vomiting    Other reaction(s): Low blood pressure, Urticaria,  Nausea and vomiting   Family History  Problem Relation Age of Onset   Lung disease Father    Cancer Brother     Prior to Admission medications   Medication Sig Start Date End Date Taking? Authorizing Provider  acetaminophen (TYLENOL) 500 MG tablet Take 500-1,000 mg by mouth every 6 (six) hours as needed for mild pain.    Yes [provider]  Cyanocobalamin (VITAMIN B12 PO) Take 1 tablet by mouth daily.   Yes [provider]  cycloSPORINE (RESTASIS) 0.05 % ophthalmic emulsion Place 1 drop into both eyes every 12 (twelve) hours.    Yes [provider]  furosemide (LASIX) 20 MG tablet Take 20 mg by mouth daily.   Yes [provider]  hydrOXYzine (ATARAX/VISTARIL) 25 MG tablet Take 25 mg by mouth at bedtime. 06/02/20  Yes [provider]  losartan  (COZAAR) 100 MG tablet Take by mouth. 12/01/19  Yes [provider]  OXYGEN Inhale 2.5 L into the lungs continuous.   Yes [provider]   Physical Exam:  Constitutional: Elderly male who appears to be acute distress Vitals:   08/09/20 0600 08/09/20 0615 08/09/20 0630 08/09/20 0644  BP: (!) 199/55 (!) 183/48 (!) 169/51   Pulse: (!) 54 (!) 120 (!) 35 (!) 46  Resp: (!) 30 (!) 24 (!) 25 (!) 26  Temp:      TempSrc:      SpO2: 98% 96% 94% 97%   Eyes: PERRL, lids and conjunctivae normal ENMT: Mucous membranes are dry. Posterior pharynx clear of any exudate or lesions. Dentures present. Neck: normal, supple, no masses, no thyromegaly Respiratory: Mildly tachypneic.  Decreased overall aeration with some mild rales appreciated Cardiovascular: Bradycardic, no murmurs / rubs / gallops.  Trace lower extremity edema. 2+ pedal pulses. No carotid bruits.  Abdomen: no tenderness, no masses palpated. No hepatosplenomegaly. Bowel sounds positive.  Musculoskeletal: no clubbing / cyanosis. No joint deformity upper and lower extremities. Good ROM, no contractures. Normal muscle tone.  Skin: Approximately 3 cm lesion noted of the forehead with Neurologic: CN 2-12 grossly intact.  Able to move all extremities Psychiatric: Decreased recent. Alert and oriented x person and place. Normal mood.   Labs on Admission:  Basic Metabolic Panel: Recent Labs  Lab 08/09/20 0455  NA 142  K 4.1  CL 108  CO2 25  GLUCOSE 105*  BUN 47*  CREATININE 1.62*  CALCIUM 8.5*  MG 2.0   Liver Function Tests: Recent Labs  Lab 08/09/20 0455  AST 14*  ALT 8  ALKPHOS 46  BILITOT 0.8  PROT 6.1*  ALBUMIN 2.7*   No results for input(s): LIPASE, AMYLASE in the last 168 hours. No results for input(s): AMMONIA in the last 168 hours. CBC: Recent Labs  Lab 08/09/20 0455  WBC 5.7  NEUTROABS 3.9  HGB 10.8*  HCT 34.3*  MCV 93.0  PLT 122*   Cardiac Enzymes: No results for input(s): CKTOTAL, CKMB,  CKMBINDEX, TROPONINI in the last 168 hours. BNP: Invalid input(s): POCBNP CBG: No results for input(s): GLUCAP in the last 168 hours.  Radiological Exams on Admission: DG Chest Portable 1 View  Result Date: 08/09/2020 CLINICAL DATA:  87 year old male with shortness of breath. On home oxygen. EXAM: PORTABLE CHEST 1 VIEW COMPARISON:  Portable chest 01/09/2019 and earlier. FINDINGS: Portable AP semi upright view at 0438 hours. Stable mediastinal contours with prior CABG. Visualized tracheal air column is within normal limits. Lower lung volumes compared to 2020. No pneumothorax or pulmonary  edema. Patchy new perihilar and basilar opacity on the right. Chronic blunting of the left costophrenic angle not significantly changed since 2019 CTA demonstrating small pleural effusion and some superimposed pleural thickening. Stable cholecystectomy clips. Paucity of bowel gas in the upper abdomen. No acute osseous abnormality identified. IMPRESSION: 1. Patchy acute right lung perihilar and basilar opacity compatible with multifocal bronchopneumonia. Sequelae of aspiration might also have this appearance. 2. Otherwise stable chest. Electronically Signed   By: Genevie Ann M.D.   On: 08/09/2020 05:27    EKG: Independently reviewed.  Sinus bradycardia with intermittent PACs Time spent: >45 minutes  [facility]   If 7PM-7AM, please contact night-coverage

## 2020-08-09 NOTE — Telephone Encounter (Signed)
Home health orders entered.

## 2020-08-09 NOTE — ED Provider Notes (Signed)
Signout from Dr. Dayna Barker.  87 year old male on oxygen here with increased shortness of breath.  Initial plan was for admission to the hospital.  [facility] Dr. Tamala Julian was able to reach patient's son.  It sounds like he was having problems with his home oxygen and [facility] is going to rectify that today.  Dr. Tamala Julian is recommending that the patient be discharged on Keflex for possible pneumonia.  I found the patient to be somewhat bradycardic although normotensive hypertensive in no distress.  He said his breathing is good and he would like to be discharged. Physical Exam  BP (!) 169/51   Pulse (!) 46   Temp 98.6 F (37 C) (Oral)   Resp (!) 26   SpO2 97%   Physical Exam  ED Course/Procedures     Procedures  MDM  Oxygen is ready and he has a tank for discharge to home.        Lawrence Rasmussen, MD 08/09/20 2007

## 2020-08-09 NOTE — ED Provider Notes (Addendum)
[facility] EMERGENCY DEPARTMENT Provider Note   CSN: [id number] Arrival date & time: 08/09/20  0414     History Chief Complaint  Patient presents with   Shortness of Breath    Lawrence Warner is a 87 y.o. male.  87 year old male with multi medical history below who presents emerged part today with dyspnea.  Patient states he has baseline shortness of breath began acutely worsened today.  He thought he was not getting of oxygen or air in his lungs.  He called EMS.  They noted that he was in Wenckebach rhythm.  They brought him here for further evaluation.  Patient states he feels little better at this time.  He wears chronic oxygen.  On review of the records and recent hospitalizations he has had CHF.  He is also bradycardia in the past.  No recent fever or cough.  No lower extremity edema.  His history is limited secondary to memory.   Shortness of Breath     Past Medical History:  Diagnosis Date   Basal cell carcinoma 10/23/2017   LEFT TEMPLE CX3 5FU   BCC (basal cell carcinoma of skin)    BPH (benign prostatic hypertrophy)    Chronic heart failure (HCC)    Chronic renal insufficiency    Coronary artery disease    CABG in 2000   Diabetes type 2, controlled ([place])    Diet-controlled   Diabetic neuropathy (HCC)    Dyslipidemia    Hypertension    ITP (idiopathic thrombocytopenic purpura)    Osteoarthritis    Pneumonia    SCC (squamous cell carcinoma) 10/23/2017   RIGHT SIDEBURN TX=CX3 5FU CAUTERY   SCC (squamous cell carcinoma) 04/23/2018   RIGHT JAWLINE TX=EXC   Squamous cell carcinoma of skin 08/17/2015   LEFT HAND CX3 5FU CAUTERY    Patient Active Problem List   Diagnosis Date Noted   Leg swelling 03/10/2019   Stage 3b chronic kidney disease ([place]) 03/10/2019   Educated about COVID-19 virus infection 03/10/2019   Dyspnea and respiratory abnormalities 01/10/2019   Acute on chronic combined systolic and diastolic heart failure ([place]) 11/23/2018    Acute exacerbation of CHF (congestive heart failure) ([place]) 11/22/2018   CAD (coronary artery disease) 11/22/2018   Type 2 diabetes mellitus ([place]) 11/22/2018   QT prolongation 11/22/2018   CHF (congestive heart failure) ([place]) 11/12/2018   Coronary artery disease involving native coronary artery of native heart without angina pectoris 01/08/2018   Generalized weakness 05/17/2017   Hypertensive emergency 05/17/2017   RSV bronchitis    Bronchospasm with bronchitis, acute 03/14/2017   Acute on chronic diastolic CHF (congestive heart failure) ([place]) 11/26/2016   Chronic kidney disease 11/26/2016   LBBB (left bundle branch block) 11/26/2016   Hypoxia    Acute respiratory failure with hypoxia ([place]) 07/20/2016   Chronic respiratory failure with hypoxia ([place]) 02/28/2016   Dyslipidemia 02/28/2016   Constipation 12/07/2015   Bradycardia 11/28/2015   Port catheter in place 10/24/2015   GERD (gastroesophageal reflux disease) 10/09/2015   Gait instability 10/09/2015   Chest pain 10/05/2015   Diet-controlled diabetes mellitus ([place]) 05/07/2015   Hx of CABG 05/07/2015   Essential hypertension 05/07/2015   Muscle cramps 04/25/2015   Shortness of breath 04/11/2015   Idiopathic thrombocytopenic purpura ([place]) 09/27/2014    Past Surgical History:  Procedure Laterality Date   CHOLECYSTECTOMY     CORONARY ARTERY BYPASS GRAFT  2000   Right hip replacement  2014   TONSILLECTOMY  Family History  Problem Relation Age of Onset   Lung disease Father    Cancer Brother     Social History   Tobacco Use   Smoking status: Former    Packs/day: 2.00    Years: 25.00    Pack years: 50.00    Types: Cigarettes   Smokeless tobacco: Never  Vaping Use   Vaping Use: Never used  Substance Use Topics   Alcohol use: No   Drug use: No    Home Medications Prior to Admission medications   Medication Sig Start Date End Date Taking? Authorizing Provider  acetaminophen (TYLENOL) 500 MG tablet  Take 500-1,000 mg by mouth every 6 (six) hours as needed for mild pain.     [provider]  brimonidine (ALPHAGAN) 0.2 % ophthalmic solution Place 1 drop into both eyes 3 (three) times daily.    [provider]  cycloSPORINE (RESTASIS) 0.05 % ophthalmic emulsion Place 1 drop into both eyes every 12 (twelve) hours.     [provider]  Dextran 70-Hypromellose (ARTIFICIAL TEARS PF OP) Place 1 drop into both eyes 4 (four) times daily.    [provider]  furosemide (LASIX) 40 MG tablet Take 40 mg by mouth.    [provider]  magnesium oxide (MAG-OX) 400 MG tablet Take 400 mg daily by mouth.    [provider]  OXYGEN Inhale 2.5 L into the lungs continuous.    [provider]  simvastatin (ZOCOR) 20 MG tablet Take 20 mg by mouth daily.    [provider]    Allergies    Adenosine, Alfuzosin hcl er, Ciprofloxacin, Imdur [isosorbide dinitrate], Lisinopril, Niacin, Penicillins, Tape, and Tuberculin purified protein derivative  Review of Systems   Review of Systems  Respiratory:  Positive for shortness of breath.   All other systems reviewed and are negative.  Physical Exam Updated Vital Signs BP (!) 169/51   Pulse (!) 46   Temp 98.6 F (37 C) (Oral)   Resp (!) 26   SpO2 97%   Physical Exam Vitals and nursing note reviewed.  Constitutional:      Appearance: He is well-developed.  HENT:     Head: Normocephalic and atraumatic.  Cardiovascular:     Rate and Rhythm: Normal rate.  Pulmonary:     Effort: Pulmonary effort is normal. No respiratory distress.     Breath sounds: Decreased breath sounds and rales (mild diffuse) present.  Abdominal:     General: There is no distension.     Palpations: Abdomen is soft. There is no hepatomegaly or splenomegaly.     Tenderness: There is no abdominal tenderness.  Musculoskeletal:        General: Normal range of motion.     Cervical back: Normal range of motion.  Skin:     General: Skin is warm and dry.  Neurological:     General: No focal deficit present.     Mental Status: He is alert.    ED Results / Procedures / Treatments   Labs (all labs ordered are listed, but only abnormal results are displayed) Labs Reviewed  CBC WITH DIFFERENTIAL/PLATELET - Abnormal; Notable for the following components:      Result Value   RBC 3.69 (*)    Hemoglobin 10.8 (*)    HCT 34.3 (*)    Platelets 122 (*)    All other components within normal limits  COMPREHENSIVE METABOLIC PANEL - Abnormal; Notable for the following components:   Glucose, Bld  105 (*)    BUN 47 (*)    Creatinine, Ser 1.62 (*)    Calcium 8.5 (*)    Total Protein 6.1 (*)    Albumin 2.7 (*)    AST 14 (*)    GFR, Estimated 38 (*)    All other components within normal limits  BRAIN NATRIURETIC PEPTIDE - Abnormal; Notable for the following components:   B Natriuretic Peptide 302.9 (*)    All other components within normal limits  TROPONIN I (HIGH SENSITIVITY) - Abnormal; Notable for the following components:   Troponin I (High Sensitivity) 27 (*)    All other components within normal limits  MAGNESIUM  TROPONIN I (HIGH SENSITIVITY)    EKG EKG Interpretation  Date/Time:  Wednesday August 09 2020 04:32:34 EDT Ventricular Rate:  44 PR Interval:  77 QRS Duration: 167 QT Interval:  500 QTC Calculation: 428 R Axis:   -57 Text Interpretation: Sinus rhythm Atrial premature complexes Short PR interval Probable left atrial enlargement Left bundle branch block No significant change since last tracing Confirmed by Varney Biles [phone]) on 08/15/2020 5:13:19 PM  Radiology DG Chest Portable 1 View  Result Date: 08/09/2020 CLINICAL DATA:  87 year old male with shortness of breath. On home oxygen. EXAM: PORTABLE CHEST 1 VIEW COMPARISON:  Portable chest 01/09/2019 and earlier. FINDINGS: Portable AP semi upright view at 0438 hours. Stable mediastinal contours with prior CABG. Visualized tracheal air column  is within normal limits. Lower lung volumes compared to 2020. No pneumothorax or pulmonary edema. Patchy new perihilar and basilar opacity on the right. Chronic blunting of the left costophrenic angle not significantly changed since 2019 CTA demonstrating small pleural effusion and some superimposed pleural thickening. Stable cholecystectomy clips. Paucity of bowel gas in the upper abdomen. No acute osseous abnormality identified. IMPRESSION: 1. Patchy acute right lung perihilar and basilar opacity compatible with multifocal bronchopneumonia. Sequelae of aspiration might also have this appearance. 2. Otherwise stable chest. Electronically Signed   By: Genevie Ann M.D.   On: 08/09/2020 05:27    Procedures .Critical Care  Date/Time: 08/09/2020 7:08 AM Performed by: Merrily Pew, MD Authorized by: Merrily Pew, MD   Critical care provider statement:    Critical care time (minutes):  45   Critical care was necessary to treat or prevent imminent or life-threatening deterioration of the following conditions:  Respiratory failure   Critical care was time spent personally by me on the following activities:  Discussions with consultants, evaluation of patient's response to treatment, examination of patient, ordering and performing treatments and interventions, ordering and review of laboratory studies, ordering and review of radiographic studies, pulse oximetry, re-evaluation of patient's condition, obtaining history from patient or surrogate and review of old charts   Medications Ordered in ED Medications  furosemide (LASIX) injection 40 mg (40 mg Intravenous Given 08/09/20 5400)    ED Course  I have reviewed the triage vital signs and the nursing notes.  Pertinent labs & imaging results that were available during my care of the patient were reviewed by me and considered in my medical decision making (see chart for details).    MDM Rules/Calculators/A&P                         Xr appears to have some  infectious vs edema. Labs ordered. On oxygen. Mildly bradycardic.  Patient profoundly bradycardic while sleeping. XR showed questionable aspiration vs bronchopneumonia. Patient does not report fever, productive cough or other signs of pneumonia.  Bnp up. Troponin slightly elevated. Still tachypneic, diurese caused some urination but still tachypnenic. Attempted to talk to son but no answer. Will d/w hospitalist for admission.   Final Clinical Impression(s) / ED Diagnoses Final diagnoses:  Shortness of breath    Rx / DC Orders ED Discharge Orders     None        Derrel Moore, Corene Cornea, MD 08/09/20 0298    Merrily Pew, MD 11/06/20 0100

## 2020-08-09 NOTE — ED Triage Notes (Signed)
Pt arrived via [facility] from [facility] for cc of shortness of breath. EMS reported that pt is chronically short of breath, using home oxygen as needed, but tonight had an acute episode of increased shortness of breath. EMS report that EKG showed Wenckebach and when rhythm normalized. Pt converted out of this rhythm in transport and sob decreased to normal levels. EEKG strips at bedside. 18g in left forearm.

## 2020-08-09 NOTE — Discharge Instructions (Addendum)
You were seen in the emergency department for evaluation of increased shortness of breath.  Your x-ray showed possible pneumonia.  Your heart failure numbers were slightly elevated and you received some IV Lasix here.  You did not want to be admitted to the hospital and felt you were doing well.  We are putting you on a course of antibiotics.  Please closely follow-up with your primary care doctor.  Return to the emergency department if any worsening or concerning symptoms.

## 2020-08-09 NOTE — ED Notes (Signed)
Lawrence Warner daughter in law [place] [phone]

## 2020-08-11 DIAGNOSIS — I5022 Chronic systolic (congestive) heart failure: Secondary | ICD-10-CM | POA: Diagnosis not present

## 2020-08-11 DIAGNOSIS — I10 Essential (primary) hypertension: Secondary | ICD-10-CM | POA: Diagnosis not present

## 2020-08-11 DIAGNOSIS — J449 Chronic obstructive pulmonary disease, unspecified: Secondary | ICD-10-CM | POA: Diagnosis not present

## 2020-08-11 DIAGNOSIS — M6281 Muscle weakness (generalized): Secondary | ICD-10-CM | POA: Diagnosis not present

## 2020-09-19 ENCOUNTER — Emergency Department (HOSPITAL_COMMUNITY): Payer: Medicare Other

## 2020-09-19 ENCOUNTER — Emergency Department (HOSPITAL_COMMUNITY)
Admission: EM | Admit: 2020-09-19 | Discharge: 2020-09-19 | Disposition: A | Discharge status: Home / Self Care | Payer: Medicare Other | Attending: Physician Assistant | Admitting: Physician Assistant

## 2020-09-19 ENCOUNTER — Other Ambulatory Visit: Payer: Self-pay

## 2020-09-19 DIAGNOSIS — Z5321 Procedure and treatment not carried out due to patient leaving prior to being seen by health care provider: Secondary | ICD-10-CM | POA: Diagnosis not present

## 2020-09-19 DIAGNOSIS — R519 Headache, unspecified: Secondary | ICD-10-CM | POA: Insufficient documentation

## 2020-09-19 NOTE — ED Triage Notes (Signed)
Pt and family reports severe headache onset this morning after breakfast. Took tylenol, allergy medication, and ibuprofen without relief. Went to [facility] walk in clinic and was sent here for further workup. Denies fall or injury, no change in mental status per family, although does have short term memory problems at baseline.

## 2020-09-19 NOTE — ED Provider Notes (Signed)
Emergency Medicine Provider Triage Evaluation Note  Lawrence Warner , a 87 y.o. male  was evaluated in triage.  Pt complains of headache x 1 day.  Patient states when he woke up this morning he noticed that he had a headache located on the top of his head.  He describes like a throbbing sensation.  He took Tylenol, over-the-counter allergy medication and 1 ibuprofen without any pain relief.  He rates pain currently 7 out of 10 in severity.  He states he typically does not get headaches.  He went to a [facility] walk-in clinic and was sent here for further evaluation and work-up.  He denies any fall or head injury.  Daughter at the bedside states that he has a history of short-term memory problems at baseline, she denies any change in his mental status.  He lives in an assisted living.  Review of Systems  Positive: Headache Negative: Neck pain, visual changes, numbness, weakness or tingling  Physical Exam  BP (!) 158/65   Pulse (!) 42   Temp 98.7 F (37.1 C) (Oral)   Resp 20   SpO2 98%  Gen:   Awake, no distress   Resp:  Normal effort  MSK:   Moves extremities without difficulty  Other:  Clear speech.  No facial droop.  Alert to self and location which is baseline per daughter.  Follows commands.  No focal weakness.  Medical Decision Making  Medically screening exam initiated at 5:03 PM.  Appropriate orders placed.  Ryett Hamman was informed that the remainder of the evaluation will be completed by another provider, this initial triage assessment does not replace that evaluation, and the importance of remaining in the ED until their evaluation is complete.  Head CT ordered.   Portions of this note were generated with Lobbyist. Dictation errors may occur despite best attempts at proofreading.    Barrie Folk, PA-C 09/19/20 1706    Daleen Bo, MD 09/19/20 1736

## 2020-09-25 ENCOUNTER — Other Ambulatory Visit: Payer: Self-pay

## 2020-09-25 ENCOUNTER — Ambulatory Visit (INDEPENDENT_AMBULATORY_CARE_PROVIDER_SITE_OTHER): Payer: Medicare Other | Admitting: Dermatology

## 2020-09-25 ENCOUNTER — Encounter: Payer: Self-pay | Admitting: Dermatology

## 2020-09-25 DIAGNOSIS — L570 Actinic keratosis: Secondary | ICD-10-CM | POA: Diagnosis not present

## 2020-09-25 DIAGNOSIS — D485 Neoplasm of uncertain behavior of skin: Secondary | ICD-10-CM

## 2020-09-25 DIAGNOSIS — C44329 Squamous cell carcinoma of skin of other parts of face: Secondary | ICD-10-CM | POA: Diagnosis not present

## 2020-09-25 NOTE — Patient Instructions (Signed)

## 2020-09-28 ENCOUNTER — Telehealth: Payer: Self-pay | Admitting: *Deleted

## 2020-09-28 NOTE — Telephone Encounter (Signed)
-----   Message from Lavonna Monarch, MD sent at 09/28/2020  6:59 AM EDT ----- Schedule Mohs

## 2020-09-28 NOTE — Telephone Encounter (Signed)
Pathology to patient daughter in law (eileen), will refer to [facility]

## 2020-09-28 NOTE — Telephone Encounter (Signed)
MOH's information sent to [facility]. 

## 2020-10-11 ENCOUNTER — Encounter: Payer: Self-pay | Admitting: Dermatology

## 2020-10-11 NOTE — Progress Notes (Signed)
   Follow-Up Visit   Subjective  Lawrence Warner is a 87 y.o. male who presents for the following: Skin Problem (Left ear- cut it off himself & right forehead x 6 months- painful ).  Enlargement lesion right forehead, several other head and neck crusts Location:  Duration:  Quality:  Associated Signs/Symptoms: Modifying Factors:  Severity:  Timing: Context:   Objective  Well appearing patient in no apparent distress; mood and affect are within normal limits. Right Forehead Marked enlargement with large ulceration in 3+ centimeter nodule typical of invasive carcinoma.     Head Multiple mostly small actinic keratoses on face and ear along with several typical superficial squamous cell carcinomas, none of which are bothersome to Lawrence Warner    A focused examination was performed including head and neck.. Relevant physical exam findings are noted in the Assessment and Plan.   Assessment & Plan    Neoplasm of uncertain behavior of skin Right Forehead  Skin / nail biopsy Type of biopsy: tangential   Informed consent: discussed and consent obtained   Timeout: patient name, date of birth, surgical site, and procedure verified   Anesthesia: the lesion was anesthetized in a standard fashion   Anesthetic:  1% lidocaine w/ epinephrine 1-100,000 local infiltration Instrument used: flexible razor blade   Hemostasis achieved with: ferric subsulfate   Outcome: patient tolerated procedure well   Post-procedure details: wound care instructions given    Specimen 1 - Surgical pathology Differential Diagnosis: BCC If positive MOHS Check Margins: No  This lesion has become quite bothersome with bleeding and discomfort.  Biopsy obtained and have already discussed referral for Mohs surgery.  AK (actinic keratosis) Head  No intervention initiated      I, Lavonna Monarch, MD, have reviewed all documentation for this visit.  The documentation on 10/11/20 for the exam,  diagnosis, procedures, and orders are all accurate and complete.

## 2020-10-16 ENCOUNTER — Other Ambulatory Visit: Payer: Medicare Other | Admitting: *Deleted

## 2020-10-16 ENCOUNTER — Other Ambulatory Visit: Payer: Self-pay

## 2020-10-16 ENCOUNTER — Other Ambulatory Visit: Payer: Medicare Other

## 2020-10-16 VITALS — BP 117/55 | HR 55 | Temp 97.9°F | Resp 18

## 2020-10-16 DIAGNOSIS — Z515 Encounter for palliative care: Secondary | ICD-10-CM

## 2020-10-16 NOTE — Progress Notes (Signed)
[facility] PALLIATIVE CARE SW NOTE  PATIENT NAME: Lawrence Warner DOB: [birth year] MRN: [id number]  PRIMARY CARE PROVIDER: Wenda Low, MD  RESPONSIBLE PARTY:  Acct ID - Guarantor Home Phone Work Phone Relationship Acct Type  [id number] - Lawrence Warner,[id number]  Self P/F     [address], [address], , [place] [address]     PLAN OF CARE and INTERVENTIONS:             GOALS OF CARE/ ADVANCE CARE PLANNING:  Goal is for patient to remain in his independent apartment. Patient is a DNR. SOCIAL/EMOTIONAL/SPIRITUAL ASSESSMENT/ INTERVENTIONS:  SW and RN- Lawrence Warner completed a check-in visit with patient at his apartment. Patient was laying down appearing to nap as he just returned from lunch. He was wearing his o2 at 3 Liters. Patient denied that he was doing fine and verbalized no concerns. SW observed a large bandage on patient's head. He stated he did not know how what happened to his head. He remains in dependent for his personal care needs, but has a personal care aide a few days a week. He continues to go down for meals independently using his electric scooter. He appears thinner in appears, but it is not profound. Patient appeared more concern with getting back to his nap and less engaged overall. He allowed the RN to take his vitals. SW provided assessment of patient's needs and comfort, supportive presence, active listening, observation and reinforced safety. He remains open to ongoing palliative care support and visits.  PATIENT/CAREGIVER EDUCATION/ COPING:  Patient appears to be coping well.  PERSONAL EMERGENCY PLAN:  Per facility protocol.  COMMUNITY RESOURCES COORDINATION/ HEALTH CARE NAVIGATION:  Patient receives personal care assistance through [place] 1x week.  FINANCIAL/LEGAL CONCERNS/INTERVENTIONS:  None.     SOCIAL HX:  Social History   Tobacco Use   Smoking status: Former    Packs/day: 2.00    Years: 25.00    Pack years: 50.00    Types: Cigarettes    Smokeless tobacco: Never  Substance Use Topics   Alcohol use: No    CODE STATUS: DNR ADVANCED DIRECTIVES: No MOST FORM COMPLETE: No HOSPICE EDUCATION PROVIDED: No  PPS: Patient ambulate short distances in his apartment and has an Transport planner to ambulate outside of his apartment, to and from meals/activities. Patient has to be reminded to put on his o2 as, ongoing increased forgetfulness.   Duration of visit and documentation 45 minutes  Lawrence Puller, LCSW

## 2020-10-16 NOTE — Progress Notes (Signed)
[facility] PALLIATIVE CARE RN NOTE  PATIENT NAME: Lawrence Warner DOB: [birth year] MRN: [id number]  PRIMARY CARE PROVIDER: Wenda Low, MD  RESPONSIBLE PARTY:  Acct ID - Guarantor Home Phone Work Phone Relationship Acct Type  [id number] - Lausch,[phone]  Self P/F     [address], [address], [place], [place] [address]   Covid-19 Pre-screening Negative  PLAN OF CARE and INTERVENTION:  ADVANCE CARE PLANNING/GOALS OF CARE: Goal is for patient to remain in [place] apartment at [facility]. He has a DNR. PATIENT/CAREGIVER EDUCATION: Symptom management, safe mobility DISEASE STATUS: Joint follow-up palliative care visit completed with LCSW, M. Lonon. Met with patient in his apartment. He is currently lying in bed. He continues to lie down between meals. When first arriving to the facility, patient was noted coming from lunch in the dining hall. He continues to maneuver his motorized chair without complications and goes for all meals. He denies pain or discomfort. He is oxygen dependent and wears at 3L/min via [place]. No shortness of breath noted. He has an occasional non-productive cough. He is able to answer most questions, but is forgetful at times. He has a bandage noted to his forehead but he does not remember what happened. He denies falling. He is ambulatory short distances in his apartment using his cane or rollator walker. He has a caregiver that comes twice weekly to assist with personal care needs. Someone comes daily to make sure he takes his medications. His family fills his pill box weekly. His hands and feet are cool to touch. His toes are bluish in appearance due to poor circulation. He denies having issues at this time and says, "I think I'm going to live." Will continue to monitor.   HISTORY OF PRESENT ILLNESS:  This is a 87 yo male with a history of CHF, CAD, GERD, DM II, CKD Stage 3, gait instability and generalized weakness. He was seen last month in [facility]  ED on 09/19/20 due to complaints of a severe headache at the top of his head. He initially went to [facility] walk-in clinic but was sent to the ED for further evaluation. CT scan was negative. He was treated and released back to [place]. Palliative care team continues to follow patient for symptom management, goals of care and complex decision making.   CODE STATUS: DNR ADVANCED DIRECTIVES: Y MOST FORM: yes PPS: 50%   PHYSICAL EXAM:   VITALS: Today's Vitals   10/16/20 1413  BP: (!) 117/55  Pulse: (!) 55  Resp: 18  Temp: 97.9 F (36.6 C)  TempSrc: Temporal  SpO2: 98%  PainSc: 0-No pain    LUNGS: decreased breath sounds CARDIAC: Cor Brady EXTREMITIES: Hands and feet are cool to touch, some cyanosis noted to toes SKIN:  Thin/frail skin, bandage noted to forehead   NEURO:  Alert and oriented to person/place, pleasant mood, generalized weakness, ambulatory w/cane or rollator walker.   (Duration of visit and documentation 45 minutes)   [name], RN BSN

## 2020-12-21 ENCOUNTER — Inpatient Hospital Stay (HOSPITAL_COMMUNITY)
Admission: EM | Admit: 2020-12-21 | Discharge: 2020-12-28 | DRG: 177 | Disposition: A | Discharge status: Skilled Nursing Facility | Payer: Medicare Other | Attending: Internal Medicine | Admitting: Internal Medicine

## 2020-12-21 ENCOUNTER — Other Ambulatory Visit: Payer: Self-pay

## 2020-12-21 ENCOUNTER — Emergency Department (HOSPITAL_COMMUNITY): Payer: Medicare Other

## 2020-12-21 DIAGNOSIS — Z881 Allergy status to other antibiotic agents status: Secondary | ICD-10-CM

## 2020-12-21 DIAGNOSIS — E1165 Type 2 diabetes mellitus with hyperglycemia: Secondary | ICD-10-CM | POA: Diagnosis present

## 2020-12-21 DIAGNOSIS — A419 Sepsis, unspecified organism: Secondary | ICD-10-CM | POA: Diagnosis present

## 2020-12-21 DIAGNOSIS — D693 Immune thrombocytopenic purpura: Secondary | ICD-10-CM | POA: Diagnosis present

## 2020-12-21 DIAGNOSIS — E1122 Type 2 diabetes mellitus with diabetic chronic kidney disease: Secondary | ICD-10-CM | POA: Diagnosis present

## 2020-12-21 DIAGNOSIS — J440 Chronic obstructive pulmonary disease with acute lower respiratory infection: Secondary | ICD-10-CM | POA: Diagnosis present

## 2020-12-21 DIAGNOSIS — J9621 Acute and chronic respiratory failure with hypoxia: Secondary | ICD-10-CM | POA: Diagnosis present

## 2020-12-21 DIAGNOSIS — Z66 Do not resuscitate: Secondary | ICD-10-CM | POA: Diagnosis present

## 2020-12-21 DIAGNOSIS — I4891 Unspecified atrial fibrillation: Secondary | ICD-10-CM | POA: Diagnosis present

## 2020-12-21 DIAGNOSIS — N179 Acute kidney failure, unspecified: Secondary | ICD-10-CM | POA: Diagnosis present

## 2020-12-21 DIAGNOSIS — I248 Other forms of acute ischemic heart disease: Secondary | ICD-10-CM | POA: Diagnosis present

## 2020-12-21 DIAGNOSIS — Z79899 Other long term (current) drug therapy: Secondary | ICD-10-CM

## 2020-12-21 DIAGNOSIS — R778 Other specified abnormalities of plasma proteins: Secondary | ICD-10-CM | POA: Diagnosis not present

## 2020-12-21 DIAGNOSIS — E875 Hyperkalemia: Secondary | ICD-10-CM | POA: Diagnosis present

## 2020-12-21 DIAGNOSIS — I2510 Atherosclerotic heart disease of native coronary artery without angina pectoris: Secondary | ICD-10-CM | POA: Diagnosis present

## 2020-12-21 DIAGNOSIS — Z809 Family history of malignant neoplasm, unspecified: Secondary | ICD-10-CM

## 2020-12-21 DIAGNOSIS — C44300 Unspecified malignant neoplasm of skin of unspecified part of face: Secondary | ICD-10-CM | POA: Diagnosis present

## 2020-12-21 DIAGNOSIS — J690 Pneumonitis due to inhalation of food and vomit: Secondary | ICD-10-CM | POA: Diagnosis not present

## 2020-12-21 DIAGNOSIS — I130 Hypertensive heart and chronic kidney disease with heart failure and stage 1 through stage 4 chronic kidney disease, or unspecified chronic kidney disease: Secondary | ICD-10-CM | POA: Diagnosis present

## 2020-12-21 DIAGNOSIS — J9611 Chronic respiratory failure with hypoxia: Secondary | ICD-10-CM | POA: Diagnosis not present

## 2020-12-21 DIAGNOSIS — K219 Gastro-esophageal reflux disease without esophagitis: Secondary | ICD-10-CM | POA: Diagnosis present

## 2020-12-21 DIAGNOSIS — U071 COVID-19: Principal | ICD-10-CM

## 2020-12-21 DIAGNOSIS — R0600 Dyspnea, unspecified: Secondary | ICD-10-CM

## 2020-12-21 DIAGNOSIS — J9601 Acute respiratory failure with hypoxia: Secondary | ICD-10-CM | POA: Diagnosis present

## 2020-12-21 DIAGNOSIS — J1282 Pneumonia due to coronavirus disease 2019: Secondary | ICD-10-CM | POA: Diagnosis present

## 2020-12-21 DIAGNOSIS — R5381 Other malaise: Secondary | ICD-10-CM | POA: Diagnosis present

## 2020-12-21 DIAGNOSIS — N400 Enlarged prostate without lower urinary tract symptoms: Secondary | ICD-10-CM | POA: Diagnosis present

## 2020-12-21 DIAGNOSIS — E119 Type 2 diabetes mellitus without complications: Secondary | ICD-10-CM

## 2020-12-21 DIAGNOSIS — Z993 Dependence on wheelchair: Secondary | ICD-10-CM

## 2020-12-21 DIAGNOSIS — I5032 Chronic diastolic (congestive) heart failure: Secondary | ICD-10-CM | POA: Diagnosis not present

## 2020-12-21 DIAGNOSIS — J441 Chronic obstructive pulmonary disease with (acute) exacerbation: Secondary | ICD-10-CM | POA: Diagnosis present

## 2020-12-21 DIAGNOSIS — E1140 Type 2 diabetes mellitus with diabetic neuropathy, unspecified: Secondary | ICD-10-CM | POA: Diagnosis present

## 2020-12-21 DIAGNOSIS — I5030 Unspecified diastolic (congestive) heart failure: Secondary | ICD-10-CM | POA: Diagnosis present

## 2020-12-21 DIAGNOSIS — N1832 Chronic kidney disease, stage 3b: Secondary | ICD-10-CM | POA: Diagnosis present

## 2020-12-21 DIAGNOSIS — E872 Acidosis, unspecified: Secondary | ICD-10-CM | POA: Diagnosis present

## 2020-12-21 DIAGNOSIS — Z515 Encounter for palliative care: Secondary | ICD-10-CM

## 2020-12-21 DIAGNOSIS — Z951 Presence of aortocoronary bypass graft: Secondary | ICD-10-CM

## 2020-12-21 DIAGNOSIS — Z96641 Presence of right artificial hip joint: Secondary | ICD-10-CM | POA: Diagnosis present

## 2020-12-21 DIAGNOSIS — J9620 Acute and chronic respiratory failure, unspecified whether with hypoxia or hypercapnia: Secondary | ICD-10-CM | POA: Diagnosis present

## 2020-12-21 DIAGNOSIS — Z888 Allergy status to other drugs, medicaments and biological substances status: Secondary | ICD-10-CM

## 2020-12-21 DIAGNOSIS — F410 Panic disorder [episodic paroxysmal anxiety] without agoraphobia: Secondary | ICD-10-CM | POA: Diagnosis present

## 2020-12-21 DIAGNOSIS — Z880 Allergy status to penicillin: Secondary | ICD-10-CM

## 2020-12-21 DIAGNOSIS — R7989 Other specified abnormal findings of blood chemistry: Secondary | ICD-10-CM | POA: Diagnosis present

## 2020-12-21 DIAGNOSIS — Z7189 Other specified counseling: Secondary | ICD-10-CM | POA: Diagnosis not present

## 2020-12-21 DIAGNOSIS — I5033 Acute on chronic diastolic (congestive) heart failure: Secondary | ICD-10-CM | POA: Diagnosis present

## 2020-12-21 DIAGNOSIS — T380X5A Adverse effect of glucocorticoids and synthetic analogues, initial encounter: Secondary | ICD-10-CM | POA: Diagnosis present

## 2020-12-21 DIAGNOSIS — E785 Hyperlipidemia, unspecified: Secondary | ICD-10-CM | POA: Diagnosis present

## 2020-12-21 DIAGNOSIS — R269 Unspecified abnormalities of gait and mobility: Secondary | ICD-10-CM | POA: Diagnosis present

## 2020-12-21 DIAGNOSIS — Z91048 Other nonmedicinal substance allergy status: Secondary | ICD-10-CM

## 2020-12-21 DIAGNOSIS — Z87891 Personal history of nicotine dependence: Secondary | ICD-10-CM

## 2020-12-21 DIAGNOSIS — I10 Essential (primary) hypertension: Secondary | ICD-10-CM | POA: Diagnosis present

## 2020-12-21 LAB — CBC WITH DIFFERENTIAL/PLATELET
Abs Immature Granulocytes: 0.06 10*3/uL (ref 0.00–0.07)
Basophils Absolute: 0 10*3/uL (ref 0.0–0.1)
Basophils Relative: 0 %
Eosinophils Absolute: 0 10*3/uL (ref 0.0–0.5)
Eosinophils Relative: 0 %
HCT: 39.5 % (ref 39.0–52.0)
Hemoglobin: 12.1 g/dL — ABNORMAL LOW (ref 13.0–17.0)
Immature Granulocytes: 1 %
Lymphocytes Relative: 11 %
Lymphs Abs: 1.2 10*3/uL (ref 0.7–4.0)
MCH: 29.6 pg (ref 26.0–34.0)
MCHC: 30.6 g/dL (ref 30.0–36.0)
MCV: 96.6 fL (ref 80.0–100.0)
Monocytes Absolute: 0.7 10*3/uL (ref 0.1–1.0)
Monocytes Relative: 6 %
Neutro Abs: 8.8 10*3/uL — ABNORMAL HIGH (ref 1.7–7.7)
Neutrophils Relative %: 82 %
Platelets: 169 10*3/uL (ref 150–400)
RBC: 4.09 MIL/uL — ABNORMAL LOW (ref 4.22–5.81)
RDW: 12.8 % (ref 11.5–15.5)
WBC: 10.8 10*3/uL — ABNORMAL HIGH (ref 4.0–10.5)
nRBC: 0 % (ref 0.0–0.2)

## 2020-12-21 LAB — I-STAT ARTERIAL BLOOD GAS, ED
Acid-base deficit: 3 mmol/L — ABNORMAL HIGH (ref 0.0–2.0)
Bicarbonate: 22.1 mmol/L (ref 20.0–28.0)
Calcium, Ion: 1.23 mmol/L (ref 1.15–1.40)
HCT: 33 % — ABNORMAL LOW (ref 39.0–52.0)
Hemoglobin: 11.2 g/dL — ABNORMAL LOW (ref 13.0–17.0)
O2 Saturation: 95 %
Potassium: 4.8 mmol/L (ref 3.5–5.1)
Sodium: 142 mmol/L (ref 135–145)
TCO2: 23 mmol/L (ref 22–32)
pCO2 arterial: 39.6 mmHg (ref 32.0–48.0)
pH, Arterial: 7.355 (ref 7.350–7.450)
pO2, Arterial: 79 mmHg — ABNORMAL LOW (ref 83.0–108.0)

## 2020-12-21 LAB — COMPREHENSIVE METABOLIC PANEL
ALT: 15 U/L (ref 0–44)
AST: 22 U/L (ref 15–41)
Albumin: 3 g/dL — ABNORMAL LOW (ref 3.5–5.0)
Alkaline Phosphatase: 56 U/L (ref 38–126)
Anion gap: 8 (ref 5–15)
BUN: 60 mg/dL — ABNORMAL HIGH (ref 8–23)
CO2: 22 mmol/L (ref 22–32)
Calcium: 9 mg/dL (ref 8.9–10.3)
Chloride: 109 mmol/L (ref 98–111)
Creatinine, Ser: 2.38 mg/dL — ABNORMAL HIGH (ref 0.61–1.24)
GFR, Estimated: 24 mL/min — ABNORMAL LOW (ref >60)
Glucose, Bld: 146 mg/dL — ABNORMAL HIGH (ref 70–99)
Potassium: 5.2 mmol/L — ABNORMAL HIGH (ref 3.5–5.1)
Sodium: 139 mmol/L (ref 135–145)
Total Bilirubin: 0.7 mg/dL (ref 0.3–1.2)
Total Protein: 7.4 g/dL (ref 6.5–8.1)

## 2020-12-21 LAB — RESP PANEL BY RT-PCR (FLU A&B, COVID) ARPGX2
Influenza A by PCR: NEGATIVE
Influenza B by PCR: NEGATIVE
SARS Coronavirus 2 by RT PCR: POSITIVE — AB

## 2020-12-21 LAB — C-REACTIVE PROTEIN: CRP: 3.3 mg/dL — ABNORMAL HIGH (ref <1.0)

## 2020-12-21 LAB — FERRITIN: Ferritin: 505 ng/mL — ABNORMAL HIGH (ref 24–336)

## 2020-12-21 LAB — LACTATE DEHYDROGENASE: LDH: 223 U/L — ABNORMAL HIGH (ref 98–192)

## 2020-12-21 LAB — GLUCOSE, CAPILLARY: Glucose-Capillary: 103 mg/dL — ABNORMAL HIGH (ref 70–99)

## 2020-12-21 LAB — BRAIN NATRIURETIC PEPTIDE: B Natriuretic Peptide: 313.1 pg/mL — ABNORMAL HIGH (ref 0.0–100.0)

## 2020-12-21 LAB — TRIGLYCERIDES: Triglycerides: 199 mg/dL — ABNORMAL HIGH (ref <150)

## 2020-12-21 LAB — TROPONIN I (HIGH SENSITIVITY)
Troponin I (High Sensitivity): 58 ng/L — ABNORMAL HIGH (ref <18)
Troponin I (High Sensitivity): 53 ng/L — ABNORMAL HIGH (ref <18)

## 2020-12-21 LAB — FIBRINOGEN: Fibrinogen: 428 mg/dL (ref 210–475)

## 2020-12-21 LAB — D-DIMER, QUANTITATIVE: D-Dimer, Quant: 2.1 ug/mL-FEU — ABNORMAL HIGH (ref 0.00–0.50)

## 2020-12-21 LAB — LACTIC ACID, PLASMA
Lactic Acid, Venous: 2.4 mmol/L (ref 0.5–1.9)
Lactic Acid, Venous: 1.3 mmol/L (ref 0.5–1.9)

## 2020-12-21 LAB — PROCALCITONIN: Procalcitonin: <0.1 ng/mL

## 2020-12-21 MED ORDER — METHYLPREDNISOLONE SODIUM SUCC 125 MG IJ SOLR
60.0000 mg | Freq: Two times a day (BID) | INTRAMUSCULAR | Status: DC
  Administered 2020-12-21 – 2020-12-22 (×3): 60 mg via INTRAVENOUS
  Filled 2020-12-21 (×3): qty 2

## 2020-12-21 MED ORDER — ASPIRIN EC 81 MG PO TBEC
81.0000 mg | DELAYED_RELEASE_TABLET | ORAL | Status: DC
  Administered 2020-12-24 – 2020-12-27 (×2): 81 mg via ORAL
  Filled 2020-12-21 (×2): qty 1

## 2020-12-21 MED ORDER — ALBUTEROL SULFATE (2.5 MG/3ML) 0.083% IN NEBU: 2.5000 mg | INHALATION_SOLUTION | RESPIRATORY_TRACT | Status: DC | PRN

## 2020-12-21 MED ORDER — SODIUM CHLORIDE 0.9 % IV SOLN
1000.0000 mL | INTRAVENOUS | Status: DC
  Administered 2020-12-21: 1000 mL via INTRAVENOUS

## 2020-12-21 MED ORDER — ACETAMINOPHEN 325 MG PO TABS
650.0000 mg | ORAL_TABLET | Freq: Four times a day (QID) | ORAL | Status: DC | PRN
  Administered 2020-12-26: 650 mg via ORAL
  Filled 2020-12-21: qty 2

## 2020-12-21 MED ORDER — HYDROXYZINE HCL 25 MG PO TABS
25.0000 mg | ORAL_TABLET | Freq: Every day | ORAL | Status: DC
  Administered 2020-12-22 – 2020-12-27 (×6): 25 mg via ORAL
  Filled 2020-12-21 (×6): qty 1

## 2020-12-21 MED ORDER — SODIUM CHLORIDE 0.9 % IV SOLN: 500.0000 mg | INTRAVENOUS | Status: DC

## 2020-12-21 MED ORDER — SODIUM CHLORIDE 0.9 % IV SOLN: 1.0000 g | INTRAVENOUS | Status: DC

## 2020-12-21 MED ORDER — SODIUM CHLORIDE 0.9 % IV SOLN: 100.0000 mg | Freq: Every day | INTRAVENOUS | Status: DC

## 2020-12-21 MED ORDER — SODIUM CHLORIDE 0.9 % IV SOLN: INTRAVENOUS | Status: AC

## 2020-12-21 MED ORDER — INSULIN ASPART 100 UNIT/ML IJ SOLN
0.0000 [IU] | Freq: Three times a day (TID) | INTRAMUSCULAR | Status: DC
  Administered 2020-12-22: 2 [IU] via SUBCUTANEOUS
  Administered 2020-12-22: 1 [IU] via SUBCUTANEOUS
  Administered 2020-12-22 – 2020-12-23 (×2): 2 [IU] via SUBCUTANEOUS
  Administered 2020-12-23: 3 [IU] via SUBCUTANEOUS

## 2020-12-21 MED ORDER — SODIUM CHLORIDE 0.9 % IV SOLN
200.0000 mg | Freq: Once | INTRAVENOUS | Status: DC
  Filled 2020-12-21: qty 40

## 2020-12-21 MED ORDER — ACETAMINOPHEN 500 MG PO TABS
1000.0000 mg | ORAL_TABLET | Freq: Once | ORAL | Status: AC
  Administered 2020-12-21: 1000 mg via ORAL
  Filled 2020-12-21: qty 2

## 2020-12-21 MED ORDER — CYCLOSPORINE 0.05 % OP EMUL
1.0000 [drp] | Freq: Two times a day (BID) | OPHTHALMIC | Status: DC
  Administered 2020-12-22 – 2020-12-28 (×11): 1 [drp] via OPHTHALMIC
  Filled 2020-12-21 (×5): qty 30

## 2020-12-21 MED ORDER — ZINC SULFATE 220 (50 ZN) MG PO CAPS
220.0000 mg | ORAL_CAPSULE | Freq: Every day | ORAL | Status: DC
  Administered 2020-12-21 – 2020-12-28 (×8): 220 mg via ORAL
  Filled 2020-12-21 (×7): qty 1

## 2020-12-21 MED ORDER — ENOXAPARIN SODIUM 30 MG/0.3ML IJ SOSY
30.0000 mg | PREFILLED_SYRINGE | INTRAMUSCULAR | Status: DC
  Administered 2020-12-21 – 2020-12-22 (×2): 30 mg via SUBCUTANEOUS
  Filled 2020-12-21 (×2): qty 0.3

## 2020-12-21 MED ORDER — VANCOMYCIN VARIABLE DOSE PER UNSTABLE RENAL FUNCTION (PHARMACIST DOSING): Status: DC

## 2020-12-21 MED ORDER — SODIUM CHLORIDE 0.9 % IV SOLN
2.0000 g | Freq: Once | INTRAVENOUS | Status: DC
  Filled 2020-12-21: qty 2

## 2020-12-21 MED ORDER — VANCOMYCIN HCL 1500 MG/300ML IV SOLN
1500.0000 mg | Freq: Once | INTRAVENOUS | Status: AC
  Administered 2020-12-21: 1500 mg via INTRAVENOUS
  Filled 2020-12-21: qty 300

## 2020-12-21 MED ORDER — ASCORBIC ACID 500 MG PO TABS
500.0000 mg | ORAL_TABLET | Freq: Every day | ORAL | Status: DC
  Administered 2020-12-21 – 2020-12-28 (×8): 500 mg via ORAL
  Filled 2020-12-21 (×8): qty 1

## 2020-12-21 MED ORDER — SODIUM CHLORIDE 0.9 % IV SOLN: 2.0000 g | INTRAVENOUS | Status: DC

## 2020-12-21 MED ORDER — SODIUM CHLORIDE 0.9 % IV SOLN
2.0000 g | Freq: Once | INTRAVENOUS | Status: AC
  Administered 2020-12-21: 2 g via INTRAVENOUS
  Filled 2020-12-21: qty 2

## 2020-12-21 MED ORDER — GUAIFENESIN-DM 100-10 MG/5ML PO SYRP
10.0000 mL | ORAL_SOLUTION | ORAL | Status: DC | PRN
  Administered 2020-12-21 – 2020-12-26 (×3): 10 mL via ORAL
  Filled 2020-12-21 (×3): qty 10

## 2020-12-21 MED ORDER — IPRATROPIUM-ALBUTEROL 0.5-2.5 (3) MG/3ML IN SOLN
3.0000 mL | Freq: Four times a day (QID) | RESPIRATORY_TRACT | Status: DC
  Administered 2020-12-22 (×2): 3 mL via RESPIRATORY_TRACT
  Filled 2020-12-21 (×4): qty 3

## 2020-12-21 NOTE — ED Provider Notes (Signed)
Blood pressure (!) 163/149, pulse 99, temperature (!) 97.1 F (36.2 C), resp. rate (!) 35, SpO2 98 %.  Assuming care from Dr. Alvino Chapel.  In short, Lawrence Warner is a 87 y.o. male with a chief complaint of Shortness of Breath .  Refer to the original H&P for additional details.  The current plan of care is to follow up on labs and imaging. Patient with new hypoxemia and AMS. Patient arrives with MOST form describing wish for DNR/DNI.   Labs reviewed. Lactate elevated. Patient is COVID positive but approx day 10 of symptoms. Continue abx with possible post-viral CAP. Confirmed code status/MOST form with with son at bedside.   Discussed patient's case with [facility] to request admission. Patient and family (if present) updated with plan. Care transferred to [facility] service.  I reviewed all nursing notes, vitals, pertinent old records, EKGs, labs, imaging (as available).  CRITICAL CARE Performed by: Margette Fast Total critical care time: 35 minutes Critical care time was exclusive of separately billable procedures and treating other patients. Critical care was necessary to treat or prevent imminent or life-threatening deterioration. Critical care was time spent personally by me on the following activities: development of treatment plan with patient and/or surrogate as well as nursing, discussions with consultants, evaluation of patient's response to treatment, examination of patient, obtaining history from patient or surrogate, ordering and performing treatments and interventions, ordering and review of laboratory studies, ordering and review of radiographic studies, pulse oximetry and re-evaluation of patient's condition.  Nanda Quinton, MD Emergency Medicine      Blease Capaldi, [facility], MD 12/21/20 [phone]

## 2020-12-21 NOTE — Progress Notes (Signed)
Pharmacy Antibiotic Note  Lawrence Warner is a 87 y.o. male admitted on 12/21/2020 with sepsis.  Pharmacy has been consulted for vancomycin and cefepime dosing.  Plan: Cefepime 2g IV q24h Vanc 1500mg  x1 then vanc variable given Cr > 2mg /dL (bsl closer to 1.5mg /dL) Order vanc levels as necessary to re-dose  Height: 5\' 7"  (170.2 cm) Weight: 80.5 kg (177 lb 7.5 oz) IBW/kg (Calculated) : 66.1  Temp (24hrs), Avg:98.6 F (37 C), Min:97.1 F (36.2 C), Max:100 F (37.8 C)  Recent Labs  Lab 12/21/20 1456  WBC 10.8*  CREATININE 2.38*  LATICACIDVEN 2.4*    Estimated Creatinine Clearance: 17.2 mL/min (A) (by C-G formula based on SCr of 2.38 mg/dL (H)).    Allergies  Allergen Reactions   Adenosine     Other reaction(s): Heart block   Alfuzosin Hcl Er Other (See Comments)    Confusion/Uroxatral    Ciprofloxacin Nausea Only    Other reaction(s): Nausea, Dizziness, Blurring of visual image, Nausea, Dizziness, Blurring of visual image   Imdur [Isosorbide Dinitrate] Other (See Comments)    Unknown reaction   Lisinopril     Other reaction(s): COUGHING   Niacin     Other reaction(s): Muscle pain   Penicillins Hives and Other (See Comments)    Has patient had a PCN reaction causing immediate rash, facial/tongue/throat swelling, SOB or lightheadedness with hypotension: Yes Has patient had a PCN reaction causing severe rash involving mucus membranes or skin necrosis: Unknown Has patient had a PCN reaction that required hospitalization: Unknown Has patient had a PCN reaction occurring within the last 10 years: Unknown If all of the above answers are "NO", then may proceed with Cephalosporin use.    Tape Other (See Comments)    SKIN IS VERY THIN AND WILL TEAR EASILY   Tuberculin Purified Protein Derivative Hives and Nausea And Vomiting    Other reaction(s): Low blood pressure, Urticaria, Nausea and vomiting    Antimicrobials this admission: Vanc 10/27 >>  Cefepime 10/27 >>   Dose  adjustments this admission: N/A  Microbiology results: 10/27 BCx:   Thank you for allowing pharmacy to be a part of this patient's care.  Joetta Manners, PharmD, [facility] Emergency Medicine Clinical Pharmacist ED RPh Phone: [place]: [phone]

## 2020-12-21 NOTE — H&P (Addendum)
History and Physical    Lawrence Warner NTI:[id number] DOB: [birth year] DOA: 12/21/2020  PCP: Lawrence Low, MD Consultants:  cardiology: Dr. Percival Spanish, hematology: Benjamine Mola agnew  Patient coming from: assisted living: independent living. Caregiver comes twice a day for medication.   Chief Complaint: shortness of breath   HPI: Lawrence Warner is a 87 y.o. male with medical history significant of hypertension, diet-controlled diabetes, CAD with history of CABG, ITP, GERD, stage IIIb CKD, HFpEF, COPD, chronic respiratory failure on 2-3 L oxygen at home who presented with worsening shortness of breath, cough and hypoxia as he takes off his oxygen. History obtained from daughter-in-law Lawrence Warner.  He has been having declining memory issues and has been taking off his oxygen at home. Symptoms started over 10 days ago with fatigue and muscle and joint pain. He got his flu shot on 10/14 and family thought it was due to his flu shot. He has not run any fever. He has a chronic cough, but his cough has gotten progressively worse with increased sputum production. Daughter in law tested him for covid on 12/17/20 and it was positive. He has not had any fever. He has not needed more oxygen than his baseline at home. 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   He has had quite poor PO intake over the past few weeks.  On palliative care  Could not obtain review of systems as patient would not answer questions or did not know.  ED Course: vitals: Temperature 97.1, blood pressure 163/149, heart rate 99, respiratory rate 35, oxygen 98% on 6 L nasal cannula Pertinent labs: COVID positive, lactic acid: 2.4> 1.3,  WBC: 10.8, hgb: 12.1, potassium: 5.2, BUN 60, creatinine: 2.38, troponin: 58>53,  Chest x-ray: Patchy airspace disease in the right lung.  Worsening since June 2022.  Could reflect infection and  inflammation. In ED blood cultures were obtained, patient started on IV fluids and given cefepime and vancomycin.  TRH was asked to admit  Review of Systems: As per HPI; otherwise review of systems reviewed and negative.   Ambulatory Status:  Ambulates with walker and cane. Has motorized scooter he uses out of home.      Past Medical History:  Diagnosis Date   Basal cell carcinoma 10/23/2017   LEFT TEMPLE CX3 5FU   BCC (basal cell carcinoma of skin)    BPH (benign prostatic hypertrophy)    Chronic heart failure (HCC)    Chronic renal insufficiency    Coronary artery disease    CABG in 2000   Diabetes type 2, controlled ([place])    Diet-controlled   Diabetic neuropathy (HCC)    Dyslipidemia    Hypertension    ITP (idiopathic thrombocytopenic purpura)    Osteoarthritis    Pneumonia    SCC (squamous cell carcinoma) 10/23/2017   RIGHT SIDEBURN TX=CX3 5FU CAUTERY   SCC (squamous cell carcinoma) 04/23/2018   RIGHT JAWLINE TX=EXC   Squamous cell carcinoma of skin 08/17/2015   LEFT HAND CX3 5FU CAUTERY    Past Surgical History:  Procedure Laterality Date   CHOLECYSTECTOMY     CORONARY ARTERY BYPASS GRAFT  2000   Right hip replacement  2014   TONSILLECTOMY      Social History   Socioeconomic History   Marital status: Widowed    Spouse name: Not on file   Number of children: Not on file   Years of education:  Not on file   Highest education level: Not on file  Occupational History   Occupation: Salesman  Tobacco Use   Smoking status: Former    Packs/day: 2.00    Years: 25.00    Pack years: 50.00    Types: Cigarettes   Smokeless tobacco: Never  Vaping Use   Vaping Use: Never used  Substance and Sexual Activity   Alcohol use: No   Drug use: No   Sexual activity: Not Currently  Other Topics Concern   Not on file  Social History Narrative    Two children.     Social Determinants of Health   Financial Resource Strain: Not on file  Food Insecurity: Not on file   Transportation Needs: Not on file  Physical Activity: Not on file  Stress: Not on file  Social Connections: Not on file  Intimate Partner Violence: Not on file    Allergies  Allergen Reactions   Adenosine     Other reaction(s): Heart block   Alfuzosin Hcl Er Other (See Comments)    Confusion/Uroxatral    Ciprofloxacin Nausea Only    Other reaction(s): Nausea, Dizziness, Blurring of visual image, Nausea, Dizziness, Blurring of visual image   Imdur [Isosorbide Dinitrate] Other (See Comments)    Unknown reaction   Lisinopril     Other reaction(s): COUGHING   Niacin     Other reaction(s): Muscle pain   Penicillins Hives and Other (See Comments)    Has patient had a PCN reaction causing immediate rash, facial/tongue/throat swelling, SOB or lightheadedness with hypotension: Yes Has patient had a PCN reaction causing severe rash involving mucus membranes or skin necrosis: Unknown Has patient had a PCN reaction that required hospitalization: Unknown Has patient had a PCN reaction occurring within the last 10 years: Unknown If all of the above answers are "NO", then may proceed with Cephalosporin use.    Tape Other (See Comments)    SKIN IS VERY THIN AND WILL TEAR EASILY   Tuberculin Purified Protein Derivative Hives and Nausea And Vomiting    Other reaction(s): Warner blood pressure, Urticaria, Nausea and vomiting    Family History  Problem Relation Age of Onset   Lung disease Father    Cancer Brother     Prior to Admission medications   Medication Sig Start Date End Date Taking? Authorizing Provider  acetaminophen (TYLENOL) 500 MG tablet Take 500-1,000 mg by mouth every 6 (six) hours as needed for mild pain.     [provider]  cephALEXin (KEFLEX) 500 MG capsule Take 1 capsule (500 mg total) by mouth 3 (three) times daily. 08/09/20   Hayden Rasmussen, MD  Cyanocobalamin (VITAMIN B12 PO) Take 1 tablet by mouth daily.    [provider]  cycloSPORINE (RESTASIS)  0.05 % ophthalmic emulsion Place 1 drop into both eyes every 12 (twelve) hours.     [provider]  furosemide (LASIX) 20 MG tablet Take 20 mg by mouth daily.    [provider]  hydrOXYzine (ATARAX/VISTARIL) 25 MG tablet Take 25 mg by mouth at bedtime. 06/02/20   [provider]  losartan (COZAAR) 100 MG tablet Take by mouth. 12/01/19   [provider]  OXYGEN Inhale 2.5 L into the lungs continuous.    [provider]    Physical Exam: Vitals:   12/21/20 1715 12/21/20 1730 12/21/20 1745 12/21/20 1800  BP: (!) 139/41 113/63 (!) 154/59 (!) 155/48  Pulse: (!) 57 60 (!) 55 (!) 57  Resp: Marland Kitchen)  24 (!) 32 (!) 30 (!) 24  Temp:      TempSrc:      SpO2: 98% 97% 97% 96%  Weight:      Height:         General:  Appears calm and comfortable and is in NAD, mild increased work of breathing.  Eyes:  PERRL, EOMI, normal lids, iris ENT:  grossly normal hearing, lips & tongue, dry mucous membranes; appropriate dentition Neck:  no LAD, masses or thyromegaly; no carotid bruits Cardiovascular:  RRR, no m/r/g. No LE edema.  Respiratory:   limited exam, no definitive crackles, rhonchi throughout.  Increased work of breathing.  Abdomen:  soft, NT, ND, NABS Back:   normal alignment, no CVAT Skin:  no rash or induration seen on limited exam Musculoskeletal:  limited, globally weak.  no bony abnormality Lower extremity:  No LE edema.  Limited foot exam with no ulcerations.  2+ distal pulses. Psychiatric:  limited talking, speech mildly intelligible, alert oriented to place and self.  Neurologic:  CN 2-12 grossly intact, moves all extremities in coordinated fashion, sensation intact    Radiological Exams on Admission: Independently reviewed - see discussion in A/P where applicable  DG Chest Portable 1 View  Result Date: 12/21/2020 CLINICAL DATA:  Shortness of breath, COVID positive EXAM: PORTABLE CHEST 1 VIEW COMPARISON:  Chest radiograph 08/09/2020 FINDINGS:  Median sternotomy wires and mediastinal surgical clips are stable. The heart is mildly enlarged, unchanged. The mediastinal contours are stable. Patchy airspace disease is seen in the right mid and lower lung, increased since the prior study from 08/09/2020. The left lung is clear. There is no significant pleural effusion. There is no pneumothorax. There is no acute osseous abnormality. IMPRESSION: Patchy airspace disease in the right lung is increased since 08/09/2020. This may reflect ongoing and worsening infection/inflammation. Recommend follow-up radiographs to resolution or chest CT to exclude underlying neoplasm. Electronically Signed   By: Valetta Mole M.D.   On: 12/21/2020 15:26    EKG: Independently reviewed.  NSR with rate 97; nonspecific ST changes with no evidence of acute ischemia. LBBB similar to previous ekg.   Labs on Admission: I have personally reviewed the available labs and imaging studies at the time of the admission.  Pertinent labs:  COVID positive,  lactic acid: 2.4> 1.3,   WBC: 10.8,  hgb: 12.1,  potassium: 5.2,  BUN 60,  creatinine: 2.38,  troponin: 58>53,   Assessment/Plan Active Problems:   Acute on chronic respiratory failure with hypoxia -87 year old male with progressive dyspnea found to have acute on chronic respiratory failure requiring 6 L oxygen from his baseline of 3 L, retractions in setting of COVID-19 infection/COPD exacerbation and possible bacterial pneumonia. -Admit to telemetry -Continue oxygen to keep saturation greater than or equal to 92% -Check procalcitonin and change antibiotics to Rocephin and azithromycin however this could just be secondary to COVID-pneumonia we will follow clinically. -Supportive therapy for COVID see below. -treat COPD exacerbation      COVID-19 virus infection -airborne and contact precaution -Qualifies for daily steroids -With 10-day history of symptoms and outside of any effective treatment range for remdesivir  also studies are poor for COVID-pneumonia with this drug -discussed immunomodulator drugs with family and have declined at this point.  -Vitamins -SABA as needed -Antitussives    Sepsis ([place]) -Patient meets sepsis criteria with a heart rate of 99 and respiratory rate of 34 source pneumonia/COVID-19 pneumonia -Second lactic acid 1.3 -See above for treatment -Changing antibiotics to Rocephin  and azithromycin is no need for cefepime and vancomycin at this point.(No MDR criteria) to cover for CAP     Elevated troponin -Troponins relatively flat and likely elevated to demand ischemia from COVID-19 infection and acute on chronic respiratory failure. -He has no chest pain -place on  telemetry monitor  COPD -? Exacerbation in setting of covid/possible pneumonia -solumedrol for copd and covid -azithromycin and rocephin for copd/pna.  -scheduled duonebs/SABA prn  -IS/flutter valve -sputum culture    Acute on chronic Stage 3b chronic kidney disease (HCC) -Poor p.o. intake over the past few weeks, likely prerenal -Holding losartan and Lasix -Gentle IV fluids at 50 cc an hour x12 hours -Follow BMP -avoid Nephrotoxic drugs -Intake and output    (HFpEF) heart failure with preserved ejection fraction ([place]) -echo 10/2018: EF of 50 to 55% with normal LV function.  Impaired relaxation pattern of LV diastolic filling. -quite dry on exam -intake/output -conservative gentle IV hydration x 12 hours     Essential hypertension -Currently controlled, holding losartan with AKI Monitor pressures     Hx of CABG/CAD -ASA twice a week    Diet-controlled diabetes mellitus (HCC) -Last A1c 7.0/ over 2 years ago -Repeat A1c pending -will put him on sliding scale insulin especially in light of steroids may need long-acting while hospitalized we will follow Accu-Cheks per protocol   Idiopathic thrombocytopenic purpura (HCC) -platelets stable at 169 -follow   Skin cancer on face -picks at this when  nervous, care order placed to make nurses aware   Body mass index is 27.8 kg/m.  Palliative care consult for possible  hospice placement.   Level of care: Telemetry Medical DVT prophylaxis:  Lovenox  Code Status:  DNR- confirmed with patient/family Family Communication: None present; I spoke with the patient's daugther in law, eileen schlemmer: [phone] Disposition Plan:  The patient is from: assisted living/independent living  Anticipated d/c is to: home vs. Hospice   Requires inpatient hospitalization for increased oxygen, IV antibiotics, IVF and is at significant risk of worsening, requires constant monitoring, assessment and MDM with specialists.    Patient is currently: acutely ill/prognosis guarded  Consults called: palliative care  Admission status:  inpatient   Dragon dictation used in completing this note.   Orma Flaming MD [facility]   How to contact the [facility] Attending or Consulting provider [place] or covering provider during after hours [place], for this patient?  Check the care team in [facility] and look for a) attending/consulting TR[facility] provider listed and b) the [facility] team listed Log into [email or web address] and use [place]'s universal password to access. If you do not have the password, please contact the hospital operator. Locate the [facility] provider you are looking for under [facility] and page to a number that you can be directly reached. If you still have difficulty reaching the provider, please page the [facility] (Director on Call) for the Hospitalists listed on amion for assistance.   12/21/2020, 6:37 PM

## 2020-12-21 NOTE — ED Triage Notes (Signed)
Pt from [place], normally A/O x 4, but EMS called out today for shob and ams. Recent covid (today last day of isolation). FD reports SpO2 room air 82%. Arrives on NRB 10L.

## 2020-12-21 NOTE — ED Provider Notes (Signed)
[place] EMERGENCY DEPARTMENT Provider Note   CSN: [id number] Arrival date & time: 12/21/20  1424     History Chief Complaint  Patient presents with   Shortness of Breath    Lawrence Warner is a 87 y.o. male.   Shortness of Breath Level 5 caveat due to altered mental status. Brought in from [place] independent living.  Normally alert and oriented x4 but today was more shortness of breath and confusion.  Patient states that he feels bad all over but cannot really provide much history.  Sitting in bed with mouth open.  Tachypneic.  Reportedly had sats of 82%.  On nonrebreather with improved oxygenation.  Not normally on oxygen per reports, but also does have some records of home oxygen in the notes.  Per EMS reportedly finished COVID isolation yesterday.  Although they were not able to determine whether that was a 5-day quarantine or a longer period.  Patient cannot tell me.  Reviewing records appears to have a history of CHF and COPD.    Past Medical History:  Diagnosis Date   Basal cell carcinoma 10/23/2017   LEFT TEMPLE CX3 5FU   BCC (basal cell carcinoma of skin)    BPH (benign prostatic hypertrophy)    Chronic heart failure (HCC)    Chronic renal insufficiency    Coronary artery disease    CABG in 2000   Diabetes type 2, controlled ([place])    Diet-controlled   Diabetic neuropathy (HCC)    Dyslipidemia    Hypertension    ITP (idiopathic thrombocytopenic purpura)    Osteoarthritis    Pneumonia    SCC (squamous cell carcinoma) 10/23/2017   RIGHT SIDEBURN TX=CX3 5FU CAUTERY   SCC (squamous cell carcinoma) 04/23/2018   RIGHT JAWLINE TX=EXC   Squamous cell carcinoma of skin 08/17/2015   LEFT HAND CX3 5FU CAUTERY    Patient Active Problem List   Diagnosis Date Noted   (HFpEF) heart failure with preserved ejection fraction ([place]) 08/09/2020   Aspiration pneumonia ([place]) 08/09/2020   Arrhythmia 08/09/2020   Leg swelling 03/10/2019   Stage 3b  chronic kidney disease ([place]) 03/10/2019   Educated about COVID-19 virus infection 03/10/2019   Dyspnea and respiratory abnormalities 01/10/2019   Acute on chronic combined systolic and diastolic heart failure ([place]) 11/23/2018   Acute exacerbation of CHF (congestive heart failure) ([place]) 11/22/2018   CAD (coronary artery disease) 11/22/2018   Type 2 diabetes mellitus ([place]) 11/22/2018   QT prolongation 11/22/2018   CHF (congestive heart failure) ([place]) 11/12/2018   Coronary artery disease involving native coronary artery of native heart without angina pectoris 01/08/2018   Generalized weakness 05/17/2017   Hypertensive emergency 05/17/2017   RSV bronchitis    Bronchospasm with bronchitis, acute 03/14/2017   Acute on chronic diastolic CHF (congestive heart failure) ([place]) 11/26/2016   Chronic kidney disease 11/26/2016   LBBB (left bundle branch block) 11/26/2016   Hypoxia    Acute on chronic respiratory failure with hypoxia ([place]) 07/20/2016   Chronic respiratory failure with hypoxia ([place]) 02/28/2016   Dyslipidemia 02/28/2016   Constipation 12/07/2015   Bradycardia 11/28/2015   Port catheter in place 10/24/2015   GERD (gastroesophageal reflux disease) 10/09/2015   Gait instability 10/09/2015   Chest pain 10/05/2015   Diet-controlled diabetes mellitus ([place]) 05/07/2015   Hx of CABG 05/07/2015   Essential hypertension 05/07/2015   Muscle cramps 04/25/2015   Shortness of breath 04/11/2015   Idiopathic thrombocytopenic purpura ([place]) 09/27/2014    Past Surgical  History:  Procedure Laterality Date   CHOLECYSTECTOMY     CORONARY ARTERY BYPASS GRAFT  2000   Right hip replacement  2014   TONSILLECTOMY         Family History  Problem Relation Age of Onset   Lung disease Father    Cancer Brother     Social History   Tobacco Use   Smoking status: Former    Packs/day: 2.00    Years: 25.00    Pack years: 50.00    Types: Cigarettes   Smokeless tobacco: Never  Vaping Use   Vaping  Use: Never used  Substance Use Topics   Alcohol use: No   Drug use: No    Home Medications Prior to Admission medications   Medication Sig Start Date End Date Taking? Authorizing Provider  acetaminophen (TYLENOL) 500 MG tablet Take 500-1,000 mg by mouth every 6 (six) hours as needed for mild pain.     [provider]  cephALEXin (KEFLEX) 500 MG capsule Take 1 capsule (500 mg total) by mouth 3 (three) times daily. 08/09/20   Hayden Rasmussen, MD  Cyanocobalamin (VITAMIN B12 PO) Take 1 tablet by mouth daily.    [provider]  cycloSPORINE (RESTASIS) 0.05 % ophthalmic emulsion Place 1 drop into both eyes every 12 (twelve) hours.     [provider]  furosemide (LASIX) 20 MG tablet Take 20 mg by mouth daily.    [provider]  hydrOXYzine (ATARAX/VISTARIL) 25 MG tablet Take 25 mg by mouth at bedtime. 06/02/20   [provider]  losartan (COZAAR) 100 MG tablet Take by mouth. 12/01/19   [provider]  OXYGEN Inhale 2.5 L into the lungs continuous.    [provider]    Allergies    Adenosine, Alfuzosin hcl er, Ciprofloxacin, Imdur [isosorbide dinitrate], Lisinopril, Niacin, Penicillins, Tape, and Tuberculin purified protein derivative  Review of Systems   Review of Systems  Unable to perform ROS: Mental status change  Respiratory:  Positive for shortness of breath.    Physical Exam Updated Vital Signs BP (!) 118/91   Pulse (!) 41   Temp (!) 97.1 F (36.2 C)   Resp (!) 29   SpO2 93%   Physical Exam Vitals and nursing note reviewed.  Constitutional:      Comments: Sitting in bed.  Answer some questions but appears confused.  Mouth open.  HENT:     Head: Atraumatic.  Cardiovascular:     Rate and Rhythm: Regular rhythm.  Pulmonary:     Comments: Harsh breath sounds with rales bilateral bases.  Tachypnea. Chest:     Chest wall: No tenderness.  Abdominal:     Tenderness: There is no abdominal tenderness.   Musculoskeletal:     Right lower leg: No edema.     Left lower leg: No edema.  Skin:    General: Skin is warm.     Capillary Refill: Capillary refill takes less than 2 seconds.  Neurological:     Mental Status: He is oriented to person, place, and time.    ED Results / Procedures / Treatments   Labs (all labs ordered are listed, but only abnormal results are displayed) Labs Reviewed  I-STAT ARTERIAL BLOOD GAS, ED - Abnormal; Notable for the following components:      Result Value   pO2, Arterial 79 (*)    Acid-base deficit 3.0 (*)    HCT 33.0 (*)    Hemoglobin 11.2 (*)    All  other components within normal limits  RESP PANEL BY RT-PCR (FLU A&B, COVID) ARPGX2  CULTURE, BLOOD (ROUTINE X 2)  CULTURE, BLOOD (ROUTINE X 2)  LACTIC ACID, PLASMA  LACTIC ACID, PLASMA  CBC WITH DIFFERENTIAL/PLATELET  COMPREHENSIVE METABOLIC PANEL  D-DIMER, QUANTITATIVE  PROCALCITONIN  LACTATE DEHYDROGENASE  FERRITIN  TRIGLYCERIDES  FIBRINOGEN  C-REACTIVE PROTEIN  BRAIN NATRIURETIC PEPTIDE  TROPONIN I (HIGH SENSITIVITY)    EKG None  Radiology No results found.  Procedures Procedures   Medications Ordered in ED Medications  0.9 %  sodium chloride infusion (1,000 mLs Intravenous New Bag/Given 12/21/20 1513)    ED Course  I have reviewed the triage vital signs and the nursing notes.  Pertinent labs & imaging results that were available during my care of the patient were reviewed by me and considered in my medical decision making (see chart for details).    MDM Rules/Calculators/A&P                           Patient with hypoxia.  Reportedly just finished isolation of COVID but does not have this testing.  Hypoxic upon arrival.  Has rales at the bases.  Decreased mental status.  MOST form shows no intubation and hopefully not ICU.  DNR.  Work-up still pending.   Care turned over to Dr. Laverta Baltimore. Final Clinical Impression(s) / ED Diagnoses Final diagnoses:  None    Rx / DC  Orders ED Discharge Orders     None        Davonna Belling, MD 12/21/20 1520

## 2020-12-22 DIAGNOSIS — E119 Type 2 diabetes mellitus without complications: Secondary | ICD-10-CM

## 2020-12-22 DIAGNOSIS — Z951 Presence of aortocoronary bypass graft: Secondary | ICD-10-CM

## 2020-12-22 DIAGNOSIS — Z66 Do not resuscitate: Secondary | ICD-10-CM

## 2020-12-22 DIAGNOSIS — J690 Pneumonitis due to inhalation of food and vomit: Secondary | ICD-10-CM

## 2020-12-22 DIAGNOSIS — R778 Other specified abnormalities of plasma proteins: Secondary | ICD-10-CM

## 2020-12-22 DIAGNOSIS — J441 Chronic obstructive pulmonary disease with (acute) exacerbation: Secondary | ICD-10-CM

## 2020-12-22 DIAGNOSIS — J1282 Pneumonia due to coronavirus disease 2019: Secondary | ICD-10-CM

## 2020-12-22 DIAGNOSIS — I5032 Chronic diastolic (congestive) heart failure: Secondary | ICD-10-CM

## 2020-12-22 DIAGNOSIS — N179 Acute kidney failure, unspecified: Secondary | ICD-10-CM

## 2020-12-22 DIAGNOSIS — I2583 Coronary atherosclerosis due to lipid rich plaque: Secondary | ICD-10-CM

## 2020-12-22 DIAGNOSIS — I2510 Atherosclerotic heart disease of native coronary artery without angina pectoris: Secondary | ICD-10-CM

## 2020-12-22 DIAGNOSIS — I10 Essential (primary) hypertension: Secondary | ICD-10-CM

## 2020-12-22 DIAGNOSIS — R5381 Other malaise: Secondary | ICD-10-CM

## 2020-12-22 DIAGNOSIS — R7989 Other specified abnormal findings of blood chemistry: Secondary | ICD-10-CM

## 2020-12-22 DIAGNOSIS — D693 Immune thrombocytopenic purpura: Secondary | ICD-10-CM

## 2020-12-22 DIAGNOSIS — N1832 Chronic kidney disease, stage 3b: Secondary | ICD-10-CM

## 2020-12-22 DIAGNOSIS — J9611 Chronic respiratory failure with hypoxia: Secondary | ICD-10-CM

## 2020-12-22 LAB — CBC WITH DIFFERENTIAL/PLATELET
Abs Immature Granulocytes: 0.05 10*3/uL (ref 0.00–0.07)
Basophils Absolute: 0 10*3/uL (ref 0.0–0.1)
Basophils Relative: 0 %
Eosinophils Absolute: 0 10*3/uL (ref 0.0–0.5)
Eosinophils Relative: 0 %
HCT: 35.7 % — ABNORMAL LOW (ref 39.0–52.0)
Hemoglobin: 11.2 g/dL — ABNORMAL LOW (ref 13.0–17.0)
Immature Granulocytes: 1 %
Lymphocytes Relative: 13 %
Lymphs Abs: 1.3 10*3/uL (ref 0.7–4.0)
MCH: 29.5 pg (ref 26.0–34.0)
MCHC: 31.4 g/dL (ref 30.0–36.0)
MCV: 93.9 fL (ref 80.0–100.0)
Monocytes Absolute: 0.5 10*3/uL (ref 0.1–1.0)
Monocytes Relative: 4 %
Neutro Abs: 8.4 10*3/uL — ABNORMAL HIGH (ref 1.7–7.7)
Neutrophils Relative %: 82 %
Platelets: 151 10*3/uL (ref 150–400)
RBC: 3.8 MIL/uL — ABNORMAL LOW (ref 4.22–5.81)
RDW: 12.8 % (ref 11.5–15.5)
WBC: 10.2 10*3/uL (ref 4.0–10.5)
nRBC: 0 % (ref 0.0–0.2)

## 2020-12-22 LAB — COMPREHENSIVE METABOLIC PANEL
ALT: 13 U/L (ref 0–44)
AST: 16 U/L (ref 15–41)
Albumin: 2.6 g/dL — ABNORMAL LOW (ref 3.5–5.0)
Alkaline Phosphatase: 44 U/L (ref 38–126)
Anion gap: 6 (ref 5–15)
BUN: 59 mg/dL — ABNORMAL HIGH (ref 8–23)
CO2: 22 mmol/L (ref 22–32)
Calcium: 8.4 mg/dL — ABNORMAL LOW (ref 8.9–10.3)
Chloride: 113 mmol/L — ABNORMAL HIGH (ref 98–111)
Creatinine, Ser: 2.07 mg/dL — ABNORMAL HIGH (ref 0.61–1.24)
GFR, Estimated: 28 mL/min — ABNORMAL LOW (ref >60)
Glucose, Bld: 117 mg/dL — ABNORMAL HIGH (ref 70–99)
Potassium: 4.9 mmol/L (ref 3.5–5.1)
Sodium: 141 mmol/L (ref 135–145)
Total Bilirubin: 0.7 mg/dL (ref 0.3–1.2)
Total Protein: 6.7 g/dL (ref 6.5–8.1)

## 2020-12-22 LAB — RESPIRATORY PANEL BY PCR

## 2020-12-22 LAB — PROCALCITONIN: Procalcitonin: <0.1 ng/mL

## 2020-12-22 LAB — D-DIMER, QUANTITATIVE: D-Dimer, Quant: 1.6 ug/mL-FEU — ABNORMAL HIGH (ref 0.00–0.50)

## 2020-12-22 LAB — GLUCOSE, CAPILLARY
Glucose-Capillary: 157 mg/dL — ABNORMAL HIGH (ref 70–99)
Glucose-Capillary: 160 mg/dL — ABNORMAL HIGH (ref 70–99)
Glucose-Capillary: 134 mg/dL — ABNORMAL HIGH (ref 70–99)
Glucose-Capillary: 156 mg/dL — ABNORMAL HIGH (ref 70–99)

## 2020-12-22 LAB — HEMOGLOBIN A1C
Hgb A1c MFr Bld: 6.3 % — ABNORMAL HIGH (ref 4.8–5.6)
Mean Plasma Glucose: 134.11 mg/dL

## 2020-12-22 LAB — FERRITIN: Ferritin: 408 ng/mL — ABNORMAL HIGH (ref 24–336)

## 2020-12-22 LAB — C-REACTIVE PROTEIN: CRP: 4.7 mg/dL — ABNORMAL HIGH (ref <1.0)

## 2020-12-22 LAB — MAGNESIUM: Magnesium: 2.3 mg/dL (ref 1.7–2.4)

## 2020-12-22 LAB — TROPONIN I (HIGH SENSITIVITY)
Troponin I (High Sensitivity): 67 ng/L — ABNORMAL HIGH (ref <18)
Troponin I (High Sensitivity): 68 ng/L — ABNORMAL HIGH (ref <18)

## 2020-12-22 LAB — PHOSPHORUS: Phosphorus: 3.2 mg/dL (ref 2.5–4.6)

## 2020-12-22 MED ORDER — AMLODIPINE BESYLATE 10 MG PO TABS
10.0000 mg | ORAL_TABLET | Freq: Every day | ORAL | Status: DC
  Administered 2020-12-22 – 2020-12-28 (×7): 10 mg via ORAL
  Filled 2020-12-22 (×7): qty 1

## 2020-12-22 MED ORDER — MORPHINE SULFATE (PF) 2 MG/ML IV SOLN
1.0000 mg | INTRAVENOUS | Status: DC | PRN
  Administered 2020-12-22: 2 mg via INTRAVENOUS
  Filled 2020-12-22: qty 1

## 2020-12-22 MED ORDER — MORPHINE SULFATE (PF) 2 MG/ML IV SOLN
1.0000 mg | INTRAVENOUS | Status: DC | PRN
  Administered 2020-12-23 – 2020-12-24 (×2): 1 mg via INTRAVENOUS
  Filled 2020-12-22 (×3): qty 1

## 2020-12-22 MED ORDER — IPRATROPIUM-ALBUTEROL 0.5-2.5 (3) MG/3ML IN SOLN
3.0000 mL | RESPIRATORY_TRACT | Status: DC | PRN
  Administered 2020-12-23: 3 mL via RESPIRATORY_TRACT
  Filled 2020-12-22: qty 3

## 2020-12-22 MED ORDER — SODIUM CHLORIDE 0.9 % IV SOLN
1000.0000 mL | INTRAVENOUS | Status: DC
  Administered 2020-12-22: 1000 mL via INTRAVENOUS

## 2020-12-22 MED ORDER — LABETALOL HCL 5 MG/ML IV SOLN: 10.0000 mg | INTRAVENOUS | Status: DC | PRN

## 2020-12-22 MED ORDER — LORAZEPAM 2 MG/ML IJ SOLN: 0.5000 mg | INTRAMUSCULAR | Status: DC | PRN

## 2020-12-22 MED ORDER — IPRATROPIUM-ALBUTEROL 0.5-2.5 (3) MG/3ML IN SOLN
3.0000 mL | Freq: Two times a day (BID) | RESPIRATORY_TRACT | Status: DC
  Administered 2020-12-22 – 2020-12-23 (×2): 3 mL via RESPIRATORY_TRACT
  Filled 2020-12-22 (×2): qty 3

## 2020-12-22 MED ORDER — SODIUM CHLORIDE 0.9 % IV SOLN
100.0000 mg | Freq: Two times a day (BID) | INTRAVENOUS | Status: AC
  Administered 2020-12-22 – 2020-12-25 (×7): 100 mg via INTRAVENOUS
  Filled 2020-12-22 (×7): qty 100

## 2020-12-22 NOTE — Evaluation (Signed)
Clinical/Bedside Swallow Evaluation Patient Details  Name: Lawrence Warner MRN: [id number] Date of Birth: 02/06/1919  Today's Date: 12/22/2020 Time: SLP Start Time (ACUTE ONLY): 1 SLP Stop Time (ACUTE ONLY): 7616 SLP Time Calculation (min) (ACUTE ONLY): 20 min  Past Medical History:  Past Medical History:  Diagnosis Date   Basal cell carcinoma 10/23/2017   LEFT TEMPLE CX3 5FU   BCC (basal cell carcinoma of skin)    BPH (benign prostatic hypertrophy)    Chronic heart failure (HCC)    Chronic renal insufficiency    Coronary artery disease    CABG in 2000   Diabetes type 2, controlled ([place])    Diet-controlled   Diabetic neuropathy (HCC)    Dyslipidemia    Hypertension    ITP (idiopathic thrombocytopenic purpura)    Osteoarthritis    Pneumonia    SCC (squamous cell carcinoma) 10/23/2017   RIGHT SIDEBURN TX=CX3 5FU CAUTERY   SCC (squamous cell carcinoma) 04/23/2018   RIGHT JAWLINE TX=EXC   Squamous cell carcinoma of skin 08/17/2015   LEFT HAND CX3 5FU CAUTERY   Past Surgical History:  Past Surgical History:  Procedure Laterality Date   CHOLECYSTECTOMY     CORONARY ARTERY BYPASS GRAFT  2000   Right hip replacement  2014   TONSILLECTOMY     HPI:  Patient is a 87 y.o. male with PMH: COPD/chronic hypoxic RF on 2 to 3 L, diastolic CHF, CAD/CABG, DM-2, ITP, CKD-3B, HTN and debility followed by palliative care at home presenting with shortness of breath, productive cough and fatigue for 10 days, and admitted with acute on chronic respiratory failure with hypoxia felt to be due to COPD exacerbation in the setting of COVID-19 infection and possible community-acquired pneumonia.  COVID-19 test positive.  CXR with patchy airspace disease in the right lung. He has a productive cough.    Assessment / Plan / Recommendation  Clinical Impression  Patient not presenting with overt s/s aspiration or penetration although he does have a non-productive cough that was observed prior to and  after PO intake. Swallow initiation appeared timely and patient's voice remained strong and clear throughout. He started to become drowsy, unable to keep eyes open and so SLP did not attempt any solid PO's. SLP did speak with RN afterwards, who stated she did not notice any coughing or difficulty with PO intake when patient having his lunch meal. SLP plans to f/u at least one time with patient to ensure diet toleration. SLP Visit Diagnosis: Dysphagia, unspecified (R13.10)    Aspiration Risk  Mild aspiration risk;No limitations    Diet Recommendation Regular;Thin liquid   Liquid Administration via: Cup;Straw Medication Administration: Whole meds with liquid Supervision: Staff to assist with self feeding;Full supervision/cueing for compensatory strategies Compensations: Slow rate;Small sips/bites Postural Changes: Seated upright at 90 degrees    Other  Recommendations Oral Care Recommendations: Oral care BID;Staff/trained caregiver to provide oral care    Recommendations for follow up therapy are one component of a multi-disciplinary discharge planning process, led by the attending physician.  Recommendations may be updated based on patient status, additional functional criteria and insurance authorization.  Follow up Recommendations None      Frequency and Duration min 1 x/week  1 week       Prognosis Prognosis for Safe Diet Advancement: Good      Swallow Study   General Date of Onset: 12/21/20 HPI: Patient is a 87 y.o. male with PMH: COPD/chronic hypoxic RF on 2 to 3 L, diastolic CHF, CAD/CABG,  DM-2, ITP, CKD-3B, HTN and debility followed by palliative care at home presenting with shortness of breath, productive cough and fatigue for 10 days, and admitted with acute on chronic respiratory failure with hypoxia felt to be due to COPD exacerbation in the setting of COVID-19 infection and possible community-acquired pneumonia.  COVID-19 test positive.  CXR with patchy airspace disease in  the right lung. He has a productive cough. Type of Study: Bedside Swallow Evaluation Previous Swallow Assessment: none found Diet Prior to this Study: Regular;Thin liquids Temperature Spikes Noted: No Respiratory Status: Nasal cannula History of Recent Intubation: No Behavior/Cognition: Alert;Cooperative;Lethargic/Drowsy Oral Cavity Assessment: Within Functional Limits Oral Care Completed by SLP: Yes Oral Cavity - Dentition: Dentures, bottom;Dentures, top Self-Feeding Abilities: Needs assist;Needs set up Patient Positioning: Upright in bed Baseline Vocal Quality: Normal Volitional Cough: Cognitively unable to elicit Volitional Swallow: Able to elicit    Oral/Motor/Sensory Function Overall Oral Motor/Sensory Function: Within functional limits   Ice Chips     Thin Liquid Thin Liquid: Within functional limits Presentation: Straw Other Comments: Patient with nonproductive cough prior to and after PO's but did not appear related to PO intake of water sips.    Nectar Thick     Honey Thick     Puree Puree: Not tested   Solid     Solid: Not tested      Sonia Baller, MA, CCC-SLP Speech Therapy

## 2020-12-22 NOTE — Plan of Care (Signed)

## 2020-12-22 NOTE — Significant Event (Addendum)
Pt with anxiety / SOB overnight, feels like he is having trouble breathing.  Has rhonchi, is COVID+.  O2 sat remains good at 96%.  RN going to give pt breathing treatment.  Review of chart indicates pt is 87 yo (turns 89 tomorrow), on O2 at baseline, memory issues, already seeing pal care for poor PO intake these past weeks, is DNI/DNR, MOST form indicating limited interventions, sounds like discussions already planned for possible inpt hospice candidacy.  Will order just 1-2mg  morphine PRN to try and treat air hunger to see if this helps.  Update: also just went into A.Fib, though this is rate controlled already per RN.  No CP per PT, just SOB.  Will check EKG.  Will check trop.  Not going to start full anticoagulation on a 87 yo DNR/DNI patient whom we are already talking about possible hospice for.  Similarly dont think this patient is likely to be candidate for [facility] or cardioversion regardless of EKG findings.  Addendum:  EKG shows A.Fib with LBBB, rate 63 (previously had sinus rhythm with LBBB)  Pt much more comfortable after morphine + breathing treatment.

## 2020-12-22 NOTE — Progress Notes (Addendum)
PROGRESS NOTE  Lawrence Warner ZRA:[id number] DOB: 01-26-1919   PCP: Wenda Low, MD  [facility]: [facility].  Lately wheelchair-bound and not using walker.  DOA: 12/21/2020 LOS: 1  Chief complaints:  Chief Complaint  Patient presents with   Shortness of Breath     Brief Narrative / Interim history: 87 year old M with PMH of COPD/chronic hypoxic RF on 2 to 3 L, diastolic CHF, CAD/CABG, DM-2, ITP, CKD-3B, HTN and debility followed by palliative care at home presenting with shortness of breath, productive cough and fatigue for 10 days, and admitted with acute on chronic respiratory failure with hypoxia felt to be due to COPD exacerbation in the setting of COVID-19 infection and possible community-acquired pneumonia.  COVID-19 test positive.  CXR with patchy airspace disease in the right lung.  Troponin 58>>>53.  Patient was a started on systemic steroids, ceftriaxone and azithromycin.  Palliative medicine consulted.  Subjective: Seen and examined earlier this morning.  Patient had an episode of anxiety and dyspnea with desaturation.  Symptoms improved with IV morphine.  This morning, he says his breathing is about the same.  He has productive cough.  He denies hemoptysis.  He denies chest pain, GI or UTI symptoms.  He has no HC POA but defers medical decision to daughter-in-law, [name] if he is not able to.  Objective: Vitals:   12/21/20 2101 12/22/20 0142 12/22/20 0600 12/22/20 0957  BP: (!) 186/47     Pulse: (!) 50     Resp: 20     Temp: 98.4 F (36.9 C)     TempSrc: Oral     SpO2: 96% 100%  100%  Weight: 76.5 kg  76.5 kg   Height:        Intake/Output Summary (Last 24 hours) at 12/22/2020 1244 Last data filed at 12/22/2020 0313 Gross per 24 hour  Intake 550.29 ml  Output --  Net 550.29 ml   Filed Weights   12/21/20 1606 12/21/20 2101 12/22/20 0600  Weight: 80.5 kg 76.5 kg 76.5 kg    Examination:  GENERAL: No apparent distress.  Nontoxic. HEENT: MMM.  Vision  grossly intact.  Hard of hearing. NECK: Supple.  No apparent JVD.  RESP: 100% on 4 L.  No IWOB.  Rhonchi bilaterally. CVS:  RRR. Heart sounds normal.  ABD/GI/GU: BS+. Abd soft, NTND.  MSK/EXT:  Moves extremities. No apparent deformity.  1+ edema in BLE. SKIN: no apparent skin lesion or wound NEURO: Awake, alert and oriented appropriately.  No apparent focal neuro deficit. PSYCH: Calm. Normal affect.    Procedures:  None  Microbiology summarized: COVID-19 PCR positive. For respiratory viral panel negative. Blood culture NGTD.  Assessment & Plan: COPD exacerbation/chronic hypoxic respiratory failure-on 2 to 3 L at baseline.  No documented desaturation but required up to 6 L by [place]. Llikely due to COVID-19 pneumonia.  He thinks he is vaccinated.  Procalcitonin negative arguing against bacterial infection. CXR with patchy airspace disease in the right lung raising concern for possible aspiration pneumonitis versus pneumonia.  Family reports cough with swallowing. -Wean oxygen.  Minimum oxygen to keep saturation above 88% -Continue IV Solu-Medrol and scheduled duo nebs with as needed -Incentive spirometry/OOB/PT/OT -SLP eval -Aspiration precaution -Agree with low-dose morphine for dyspnea and air hunger -Palliative medicine consulted.   COVID-19 virus infection: He thinks he is vaccinated.  Symptomatic for 10 days.  Tested positive at home on 12/17/2020.  COVID-19 PCR positive.  CXR as above.  Inflammatory markers elevated.  Pro-Cal negative. -Management as  above -Monitor inflammatory markers -Subcu Lovenox for VTE prophylaxis -airborne and contact precaution for 10 days from 12/17/2020   Sepsis ruled out.   Elevated troponin: 53 >67>> 68.  Likely demand ischemia and and possible myocarditis from COVID-19 infection.  Denies chest pain. -No further cardiac work-up indicated.   AKI/azotemia superimposed on CKD-3B: Likely prerenal from poor p.o. intake.  Also an ARB and Lasix at home.   Improved. Recent Labs    08/09/20 0455 12/21/20 1456 12/22/20 0039  BUN 47* 60* 59*  CREATININE 1.62* 2.38* 2.07*  -Decrease IVF to 50 cc an hour -Continue holding losartan and Lasix -Recheck renal function in the morning   Chronic diastolic CHF: TTE in 0/9735 with LVEF of 50 to 55%, impaired relaxation.  Appears euvolemic on exam except for BLE edema.  BNP 313 (about baseline).  On Lasix at home.  -Decrease IVF to 50 cc an hour -Closely monitor fluid and respiratory status -Monitor renal functions and electrolytes  Rate controlled A. Fib: Seems new.  Not in RVR.  Not on rate or rhythm control medication. -Continue monitoring on telemetry   Essential hypertension: BP elevated. -Decrease IVF as above -Add amlodipine temporarily until we are able to resume his home BP meds. -As needed labetalol with parameters  History of CAD/CABG -Continue home aspirin    Diet-controlled diabetes mellitus: A1c 6.3%. Recent Labs  Lab 12/21/20 2133 12/22/20 0547 12/22/20 1226  GLUCAP 103* 157* 160*  -Continue SSI and CBG monitoring   Idiopathic thrombocytopenic purpura: Stable  Skin cancer on face -picks at this when nervous, care order placed to make nurses aware   Anxiety/Panic attack:  -Continue Atarax and as needed morphine  Ambulatory dysfunction/debility: Likely wheelchair-bound and not using walker. -PT/OT   Goal of care counseling-appropriately DNR/DNI.  Patient is followed by palliative medicine -Palliative medicine consulted  Body mass index is 26.41 kg/m.         DVT prophylaxis:  enoxaparin (LOVENOX) injection 30 mg Start: 12/21/20 2100  Code Status: DNR/DNI Family Communication: Updated patient's daughter in law with patient's permission. Level of care: Telemetry Medical Status is: Inpatient  Remains inpatient appropriate because: COPD exacerbation, COVID-19 infection and respiratory failure requiring higher level of oxygen, and IV  medications   Consultants:  Palliative medicine   Sch Meds:  Scheduled Meds:  vitamin C  500 mg Oral Daily   [START ON 12/24/2020] aspirin EC  81 mg Oral Once per day on Sun Wed   cycloSPORINE  1 drop Both Eyes Q12H   enoxaparin (LOVENOX) injection  30 mg Subcutaneous Q24H   hydrOXYzine  25 mg Oral QHS   insulin aspart  0-9 Units Subcutaneous TID WC   ipratropium-albuterol  3 mL Nebulization BID   methylPREDNISolone (SOLU-MEDROL) injection  60 mg Intravenous Q12H   zinc sulfate  220 mg Oral Daily   Continuous Infusions:  sodium chloride 1,000 mL (12/21/20 1513)   cefTRIAXone (ROCEPHIN)  IV     doxycycline (VIBRAMYCIN) IV 100 mg (12/22/20 1241)   PRN Meds:.acetaminophen, albuterol, guaiFENesin-dextromethorphan, morphine injection  Antimicrobials: Anti-infectives (From admission, onward)    Start     Dose/Rate Route Frequency Ordered Stop   12/22/20 1700  ceFEPIme (MAXIPIME) 2 g in sodium chloride 0.9 % 100 mL IVPB  Status:  Discontinued        2 g 200 mL/hr over 30 Minutes Intravenous Every 24 hours 12/21/20 1646 12/21/20 2023   12/22/20 1700  cefTRIAXone (ROCEPHIN) 1 g in sodium chloride 0.9 % 100 mL  IVPB       Note to Pharmacy: Hives appears to be allergy to PCN. Has had keflex in past, please see if had any other cephalosporins.   1 g 200 mL/hr over 30 Minutes Intravenous Every 24 hours 12/21/20 2025     12/22/20 1000  remdesivir 100 mg in sodium chloride 0.9 % 100 mL IVPB  Status:  Discontinued       See Hyperspace for full Linked Orders Report.   100 mg 200 mL/hr over 30 Minutes Intravenous Daily 12/21/20 1719 12/21/20 1748   12/22/20 1000  azithromycin (ZITHROMAX) 500 mg in sodium chloride 0.9 % 250 mL IVPB  Status:  Discontinued        500 mg 250 mL/hr over 60 Minutes Intravenous Every 24 hours 12/21/20 2025 12/22/20 0848   12/22/20 0945  doxycycline (VIBRAMYCIN) 100 mg in sodium chloride 0.9 % 250 mL IVPB        100 mg 125 mL/hr over 120 Minutes Intravenous Every  12 hours 12/22/20 0848     12/21/20 1730  remdesivir 200 mg in sodium chloride 0.9% 250 mL IVPB  Status:  Discontinued       See Hyperspace for full Linked Orders Report.   200 mg 580 mL/hr over 30 Minutes Intravenous Once 12/21/20 1719 12/21/20 1748   12/21/20 1646  vancomycin variable dose per unstable renal function (pharmacist dosing)  Status:  Discontinued         Does not apply See admin instructions 12/21/20 1646 12/21/20 2023   12/21/20 1630  ceFEPIme (MAXIPIME) 2 g in sodium chloride 0.9 % 100 mL IVPB        2 g 200 mL/hr over 30 Minutes Intravenous  Once 12/21/20 1628 12/21/20 1855   12/21/20 1615  vancomycin (VANCOREADY) IVPB 1500 mg/300 mL        1,500 mg 150 mL/hr over 120 Minutes Intravenous  Once 12/21/20 1611 12/21/20 1932   12/21/20 1615  aztreonam (AZACTAM) 2 g in sodium chloride 0.9 % 100 mL IVPB  Status:  Discontinued        2 g 200 mL/hr over 30 Minutes Intravenous  Once 12/21/20 1611 12/21/20 1622        I have personally reviewed the following labs and images: CBC: Recent Labs  Lab 12/21/20 1456 12/21/20 1515 12/22/20 0039  WBC 10.8*  --  10.2  NEUTROABS 8.8*  --  8.4*  HGB 12.1* 11.2* 11.2*  HCT 39.5 33.0* 35.7*  MCV 96.6  --  93.9  PLT 169  --  151   BMP &GFR Recent Labs  Lab 12/21/20 1456 12/21/20 1515 12/22/20 0039  NA 139 142 141  K 5.2* 4.8 4.9  CL 109  --  113*  CO2 22  --  22  GLUCOSE 146*  --  117*  BUN 60*  --  59*  CREATININE 2.38*  --  2.07*  CALCIUM 9.0  --  8.4*  MG  --   --  2.3  PHOS  --   --  3.2   Estimated Creatinine Clearance: 18.2 mL/min (A) (by C-G formula based on SCr of 2.07 mg/dL (H)). Liver & Pancreas: Recent Labs  Lab 12/21/20 1456 12/22/20 0039  AST 22 16  ALT 15 13  ALKPHOS 56 44  BILITOT 0.7 0.7  PROT 7.4 6.7  ALBUMIN 3.0* 2.6*   No results for input(s): LIPASE, AMYLASE in the last 168 hours. No results for input(s): AMMONIA in the last 168 hours. Diabetic: Recent Labs  12/22/20 0039  HGBA1C  6.3*   Recent Labs  Lab 12/21/20 2133 12/22/20 0547 12/22/20 1226  GLUCAP 103* 157* 160*   Cardiac Enzymes: No results for input(s): CKTOTAL, CKMB, CKMBINDEX, TROPONINI in the last 168 hours. No results for input(s): PROBNP in the last 8760 hours. Coagulation Profile: No results for input(s): INR, PROTIME in the last 168 hours. Thyroid Function Tests: No results for input(s): TSH, T4TOTAL, FREET4, T3FREE, THYROIDAB in the last 72 hours. Lipid Profile: Recent Labs    12/21/20 1456  TRIG 199*   Anemia Panel: Recent Labs    12/21/20 1456 12/22/20 0039  FERRITIN 505* 408*   Urine analysis:    Component Value Date/Time   COLORURINE YELLOW 11/11/2018 1747   APPEARANCEUR CLEAR 11/11/2018 1747   LABSPEC 1.018 11/11/2018 1747   PHURINE 6.0 11/11/2018 1747   GLUCOSEU NEGATIVE 11/11/2018 1747   HGBUR NEGATIVE 11/11/2018 1747   BILIRUBINUR NEGATIVE 11/11/2018 [place] 11/11/2018 1747   PROTEINUR 100 (A) 11/11/2018 1747   NITRITE NEGATIVE 11/11/2018 1747   LEUKOCYTESUR NEGATIVE 11/11/2018 1747   Sepsis Labs: Invalid input(s): PROCALCITONIN, [place]  Microbiology: Recent Results (from the past 240 hour(s))  Resp Panel by RT-PCR (Flu A&B, Covid) Nasopharyngeal Swab     Status: Abnormal   Collection Time: 12/21/20  2:56 PM   Specimen: Nasopharyngeal Swab; Nasopharyngeal(NP) swabs in vial transport medium  Result Value Ref Range Status   SARS Coronavirus 2 by RT PCR POSITIVE (A) NEGATIVE Final    Comment: RESULT CALLED TO, READ BACK BY AND VERIFIED WITHGareth Eagle RN [id number] 12/21/20 A BROWNING (NOTE) SARS-CoV-2 target nucleic acids are DETECTED.  The SARS-CoV-2 RNA is generally detectable in upper respiratory specimens during the acute phase of infection. Positive results are indicative of the presence of the identified virus, but do not rule out bacterial infection or co-infection with other pathogens not detected by the test. Clinical correlation  with patient history and other diagnostic information is necessary to determine patient infection status. The expected result is Negative.  Fact Sheet for Patients: EntrepreneurPulse.com.au  Fact Sheet for Healthcare Providers: IncredibleEmployment.be  This test is not yet approved or cleared by the [place] FDA and  has been authorized for detection and/or diagnosis of SARS-CoV-2 by FDA under an Emergency Use Authorization (EUA).  This EUA will remain in effect (meaning this test c an be used) for the duration of  the COVID-19 declaration under Section 564(b)(1) of the Act, 21 U.S.C. section 360bbb-3(b)(1), unless the authorization is terminated or revoked sooner.     Influenza A by PCR NEGATIVE NEGATIVE Final   Influenza B by PCR NEGATIVE NEGATIVE Final    Comment: (NOTE) The Xpert Xpress SARS-CoV-2/FLU/RSV plus assay is intended as an aid in the diagnosis of influenza from Nasopharyngeal swab specimens and should not be used as a sole basis for treatment. Nasal washings and aspirates are unacceptable for Xpert Xpress SARS-CoV-2/FLU/RSV testing.  Fact Sheet for Patients: EntrepreneurPulse.com.au  Fact Sheet for Healthcare Providers: IncredibleEmployment.be  This test is not yet approved or cleared by the [place] FDA and has been authorized for detection and/or diagnosis of SARS-CoV-2 by FDA under an Emergency Use Authorization (EUA). This EUA will remain in effect (meaning this test can be used) for the duration of the COVID-19 declaration under Section 564(b)(1) of the Act, 21 U.S.C. section 360bbb-3(b)(1), unless the authorization is terminated or revoked.  Performed at [facility], [place] [address] [place],  [address]  Blood Culture (routine x 2)     Status: None (Preliminary result)   Collection Time: 12/21/20  2:56 PM   Specimen: BLOOD  Result Value Ref Range Status    Specimen Description BLOOD RIGHT ANTECUBITAL  Final   Special Requests   Final    BOTTLES DRAWN AEROBIC AND ANAEROBIC Blood Culture adequate volume   Culture   Final    NO GROWTH < 24 HOURS Performed at [facility], [place] [address], [place], [place] [address]    Report Status PENDING  Incomplete  Blood Culture (routine x 2)     Status: None (Preliminary result)   Collection Time: 12/21/20  2:56 PM   Specimen: BLOOD  Result Value Ref Range Status   Specimen Description BLOOD LEFT ANTECUBITAL  Final   Special Requests   Final    BOTTLES DRAWN AEROBIC AND ANAEROBIC Blood Culture adequate volume   Culture   Final    NO GROWTH < 24 HOURS Performed at [facility], [place] [address], [place], [place] [address]    Report Status PENDING  Incomplete  Respiratory (~20 pathogens) panel by PCR     Status: None   Collection Time: 12/21/20  8:13 PM   Specimen: Nasopharyngeal Swab; Respiratory  Result Value Ref Range Status   Adenovirus NOT DETECTED NOT DETECTED Final   Coronavirus 229E NOT DETECTED NOT DETECTED Final    Comment: (NOTE) The Coronavirus on the Respiratory Panel, DOES NOT test for the novel  Coronavirus (2019 nCoV)    Coronavirus HKU1 NOT DETECTED NOT DETECTED Final   Coronavirus NL63 NOT DETECTED NOT DETECTED Final   Coronavirus OC43 NOT DETECTED NOT DETECTED Final   Metapneumovirus NOT DETECTED NOT DETECTED Final   Rhinovirus / Enterovirus NOT DETECTED NOT DETECTED Final   Influenza A NOT DETECTED NOT DETECTED Final   Influenza B NOT DETECTED NOT DETECTED Final   Parainfluenza Virus 1 NOT DETECTED NOT DETECTED Final   Parainfluenza Virus 2 NOT DETECTED NOT DETECTED Final   Parainfluenza Virus 3 NOT DETECTED NOT DETECTED Final   Parainfluenza Virus 4 NOT DETECTED NOT DETECTED Final   Respiratory Syncytial Virus NOT DETECTED NOT DETECTED Final   Bordetella pertussis NOT DETECTED NOT DETECTED Final   Bordetella Parapertussis NOT DETECTED NOT DETECTED Final    Chlamydophila pneumoniae NOT DETECTED NOT DETECTED Final   Mycoplasma pneumoniae NOT DETECTED NOT DETECTED Final    Comment: Performed at [facility], [place]. [address], [place], [place] [address]    Radiology Studies: DG Chest Portable 1 View  Result Date: 12/21/2020 CLINICAL DATA:  Shortness of breath, COVID positive EXAM: PORTABLE CHEST 1 VIEW COMPARISON:  Chest radiograph 08/09/2020 FINDINGS: Median sternotomy wires and mediastinal surgical clips are stable. The heart is mildly enlarged, unchanged. The mediastinal contours are stable. Patchy airspace disease is seen in the right mid and lower lung, increased since the prior study from 08/09/2020. The left lung is clear. There is no significant pleural effusion. There is no pneumothorax. There is no acute osseous abnormality. IMPRESSION: Patchy airspace disease in the right lung is increased since 08/09/2020. This may reflect ongoing and worsening infection/inflammation. Recommend follow-up radiographs to resolution or chest CT to exclude underlying neoplasm. Electronically Signed   By: Valetta Mole M.D.   On: 12/21/2020 15:26       Kortlyn Koltz T. Kittrell  If 7PM-7AM, please contact night-coverage [email or web address] 12/22/2020, 12:44 PM

## 2020-12-23 ENCOUNTER — Inpatient Hospital Stay (HOSPITAL_COMMUNITY): Payer: Medicare Other

## 2020-12-23 ENCOUNTER — Encounter (HOSPITAL_COMMUNITY): Payer: Medicare Other

## 2020-12-23 DIAGNOSIS — Z515 Encounter for palliative care: Secondary | ICD-10-CM

## 2020-12-23 DIAGNOSIS — Z7189 Other specified counseling: Secondary | ICD-10-CM

## 2020-12-23 LAB — RENAL FUNCTION PANEL
Albumin: 2.5 g/dL — ABNORMAL LOW (ref 3.5–5.0)
Anion gap: 9 (ref 5–15)
BUN: 59 mg/dL — ABNORMAL HIGH (ref 8–23)
CO2: 17 mmol/L — ABNORMAL LOW (ref 22–32)
Calcium: 8.6 mg/dL — ABNORMAL LOW (ref 8.9–10.3)
Chloride: 115 mmol/L — ABNORMAL HIGH (ref 98–111)
Creatinine, Ser: 1.74 mg/dL — ABNORMAL HIGH (ref 0.61–1.24)
GFR, Estimated: 35 mL/min — ABNORMAL LOW (ref >60)
Glucose, Bld: 162 mg/dL — ABNORMAL HIGH (ref 70–99)
Phosphorus: 3 mg/dL (ref 2.5–4.6)
Potassium: 5.9 mmol/L — ABNORMAL HIGH (ref 3.5–5.1)
Sodium: 141 mmol/L (ref 135–145)

## 2020-12-23 LAB — GLUCOSE, CAPILLARY
Glucose-Capillary: 153 mg/dL — ABNORMAL HIGH (ref 70–99)
Glucose-Capillary: 207 mg/dL — ABNORMAL HIGH (ref 70–99)
Glucose-Capillary: 181 mg/dL — ABNORMAL HIGH (ref 70–99)
Glucose-Capillary: 156 mg/dL — ABNORMAL HIGH (ref 70–99)

## 2020-12-23 LAB — CBC
HCT: 33.1 % — ABNORMAL LOW (ref 39.0–52.0)
Hemoglobin: 10.1 g/dL — ABNORMAL LOW (ref 13.0–17.0)
MCH: 29.1 pg (ref 26.0–34.0)
MCHC: 30.5 g/dL (ref 30.0–36.0)
MCV: 95.4 fL (ref 80.0–100.0)
Platelets: 132 10*3/uL — ABNORMAL LOW (ref 150–400)
RBC: 3.47 MIL/uL — ABNORMAL LOW (ref 4.22–5.81)
RDW: 12.9 % (ref 11.5–15.5)
WBC: 12.7 10*3/uL — ABNORMAL HIGH (ref 4.0–10.5)
nRBC: 0 % (ref 0.0–0.2)

## 2020-12-23 LAB — D-DIMER, QUANTITATIVE: D-Dimer, Quant: >20 ug/mL-FEU — ABNORMAL HIGH (ref 0.00–0.50)

## 2020-12-23 LAB — PROCALCITONIN: Procalcitonin: <0.1 ng/mL

## 2020-12-23 LAB — MAGNESIUM: Magnesium: 2.2 mg/dL (ref 1.7–2.4)

## 2020-12-23 LAB — C-REACTIVE PROTEIN: CRP: 3.7 mg/dL — ABNORMAL HIGH (ref <1.0)

## 2020-12-23 LAB — HEPARIN LEVEL (UNFRACTIONATED): Heparin Unfractionated: 0.31 IU/mL (ref 0.30–0.70)

## 2020-12-23 LAB — BRAIN NATRIURETIC PEPTIDE: B Natriuretic Peptide: 840.3 pg/mL — ABNORMAL HIGH (ref 0.0–100.0)

## 2020-12-23 MED ORDER — INSULIN ASPART 100 UNIT/ML IJ SOLN: 0.0000 [IU] | Freq: Every day | INTRAMUSCULAR | Status: DC

## 2020-12-23 MED ORDER — HEPARIN (PORCINE) 25000 UT/250ML-% IV SOLN
1300.0000 [IU]/h | INTRAVENOUS | Status: DC
  Administered 2020-12-23: 1100 [IU]/h via INTRAVENOUS
  Administered 2020-12-24: 1150 [IU]/h via INTRAVENOUS
  Filled 2020-12-23 (×2): qty 250

## 2020-12-23 MED ORDER — HEPARIN BOLUS VIA INFUSION
2000.0000 [IU] | Freq: Once | INTRAVENOUS | Status: AC
  Administered 2020-12-23: 2000 [IU] via INTRAVENOUS
  Filled 2020-12-23: qty 2000

## 2020-12-23 MED ORDER — SODIUM BICARBONATE 650 MG PO TABS
650.0000 mg | ORAL_TABLET | Freq: Three times a day (TID) | ORAL | Status: DC
  Administered 2020-12-23 – 2020-12-28 (×17): 650 mg via ORAL
  Filled 2020-12-23 (×17): qty 1

## 2020-12-23 MED ORDER — IPRATROPIUM-ALBUTEROL 0.5-2.5 (3) MG/3ML IN SOLN
3.0000 mL | Freq: Three times a day (TID) | RESPIRATORY_TRACT | Status: DC
  Administered 2020-12-23 – 2020-12-24 (×3): 3 mL via RESPIRATORY_TRACT
  Filled 2020-12-23 (×3): qty 3

## 2020-12-23 MED ORDER — HYDRALAZINE HCL 25 MG PO TABS: 25.0000 mg | ORAL_TABLET | Freq: Four times a day (QID) | ORAL | Status: DC | PRN

## 2020-12-23 MED ORDER — FUROSEMIDE 10 MG/ML IJ SOLN
40.0000 mg | Freq: Once | INTRAMUSCULAR | Status: AC
  Administered 2020-12-23: 40 mg via INTRAVENOUS
  Filled 2020-12-23: qty 4

## 2020-12-23 MED ORDER — INSULIN ASPART 100 UNIT/ML IJ SOLN
0.0000 [IU] | Freq: Three times a day (TID) | INTRAMUSCULAR | Status: DC
Start: 2020-12-23 — End: 2020-12-28
  Administered 2020-12-23: 3 [IU] via SUBCUTANEOUS
  Administered 2020-12-24: 2 [IU] via SUBCUTANEOUS
  Administered 2020-12-24 (×2): 3 [IU] via SUBCUTANEOUS
  Administered 2020-12-25: 2 [IU] via SUBCUTANEOUS
  Administered 2020-12-25 (×2): 3 [IU] via SUBCUTANEOUS
  Administered 2020-12-26: 2 [IU] via SUBCUTANEOUS
  Administered 2020-12-26 (×2): 3 [IU] via SUBCUTANEOUS
  Administered 2020-12-27: 2 [IU] via SUBCUTANEOUS
  Administered 2020-12-27 – 2020-12-28 (×4): 3 [IU] via SUBCUTANEOUS
  Administered 2020-12-28: 2 [IU] via SUBCUTANEOUS

## 2020-12-23 MED ORDER — SODIUM ZIRCONIUM CYCLOSILICATE 10 G PO PACK
10.0000 g | PACK | Freq: Once | ORAL | Status: AC
  Administered 2020-12-23: 10 g via ORAL
  Filled 2020-12-23: qty 1

## 2020-12-23 MED ORDER — METHYLPREDNISOLONE SODIUM SUCC 125 MG IJ SOLR
80.0000 mg | INTRAMUSCULAR | Status: DC
  Administered 2020-12-23 – 2020-12-25 (×3): 80 mg via INTRAVENOUS
  Filled 2020-12-23 (×3): qty 2

## 2020-12-23 MED ORDER — CEFTRIAXONE SODIUM 1 G IJ SOLR
1.0000 g | INTRAMUSCULAR | Status: AC
Start: 2020-12-23 — End: 2020-12-27
  Administered 2020-12-23 – 2020-12-27 (×5): 1 g via INTRAVENOUS
  Filled 2020-12-23 (×5): qty 10

## 2020-12-23 NOTE — Progress Notes (Signed)
ANTICOAGULATION CONSULT NOTE -Initial  Pharmacy Consult for heparin Indication: DVT  Allergies  Allergen Reactions   Adenosine     Other reaction(s): Heart block   Alfuzosin Hcl Er Other (See Comments)    Confusion/Uroxatral    Ciprofloxacin Nausea Only    Other reaction(s): Nausea, Dizziness, Blurring of visual image, Nausea, Dizziness, Blurring of visual image   Imdur [Isosorbide Dinitrate] Other (See Comments)    Unknown reaction   Lisinopril     Other reaction(s): COUGHING   Niacin     Other reaction(s): Muscle pain   Penicillins Hives and Other (See Comments)    Has patient had a PCN reaction causing immediate rash, facial/tongue/throat swelling, SOB or lightheadedness with hypotension: Yes Has patient had a PCN reaction causing severe rash involving mucus membranes or skin necrosis: Unknown Has patient had a PCN reaction that required hospitalization: Unknown Has patient had a PCN reaction occurring within the last 10 years: Unknown If all of the above answers are "NO", then may proceed with Cephalosporin use.    Tape Other (See Comments)    SKIN IS VERY THIN AND WILL TEAR EASILY   Tuberculin Purified Protein Derivative Hives and Nausea And Vomiting    Other reaction(s): Low blood pressure, Urticaria, Nausea and vomiting    Patient Measurements: Height: 5\' 7"  (170.2 cm) Weight: 77.4 kg (170 lb 10.2 oz) IBW/kg (Calculated) : 66.1 Heparin Dosing Weight: 77.4 kg  Vital Signs: Temp: 97.8 F (36.6 C) (10/29 0700) Temp Source: Oral (10/29 0700) BP: 151/63 (10/29 0700) Pulse Rate: 52 (10/29 0843)  Labs: Recent Labs    12/21/20 1456 12/21/20 1515 12/21/20 1654 12/22/20 0039 12/22/20 0402 September 13, 2020 0417  HGB 12.1* 11.2*  --  11.2*  --  10.1*  HCT 39.5 33.0*  --  35.7*  --  33.1*  PLT 169  --   --  151  --  132*  CREATININE 2.38*  --   --  2.07*  --  1.74*  TROPONINIHS 58*  --  53* 67* 68*  --     Estimated Creatinine Clearance: 21.1 mL/min (A) (by C-G formula  based on SCr of 1.74 mg/dL (H)).   Medications:  Medications Prior to Admission  Medication Sig Dispense Refill Last Dose   acetaminophen (TYLENOL) 500 MG tablet Take 500-1,000 mg by mouth every 6 (six) hours as needed for mild pain.    Past Month   aspirin EC 81 MG tablet Take 81 mg by mouth 2 (two) times a week. Sunday's and Wednesday's   Past Week   Cyanocobalamin (VITAMIN B12 PO) Take 1 tablet by mouth daily.   12/21/2020   cycloSPORINE (RESTASIS) 0.05 % ophthalmic emulsion Place 1 drop into both eyes every 12 (twelve) hours.    Past Week   furosemide (LASIX) 20 MG tablet Take 20 mg by mouth daily.   12/21/2020   hydrOXYzine (ATARAX/VISTARIL) 25 MG tablet Take 25 mg by mouth at bedtime.   Past Week   losartan (COZAAR) 100 MG tablet Take 100 mg by mouth daily.   12/21/2020   OXYGEN Inhale 2.5-3 L into the lungs continuous.   12/21/2020   cephALEXin (KEFLEX) 500 MG capsule Take 1 capsule (500 mg total) by mouth 3 (three) times daily. (Patient not taking: No sig reported) 21 capsule 0 Completed Course   Scheduled:   amLODipine  10 mg Oral Daily   vitamin C  500 mg Oral Daily   [START ON 12/24/2020] aspirin EC  81 mg Oral Once per  day on Sun Wed   cycloSPORINE  1 drop Both Eyes Q12H   hydrOXYzine  25 mg Oral QHS   insulin aspart  0-9 Units Subcutaneous TID WC   ipratropium-albuterol  3 mL Nebulization TID   methylPREDNISolone (SOLU-MEDROL) injection  80 mg Intravenous Q24H   sodium bicarbonate  650 mg Oral TID   zinc sulfate  220 mg Oral Daily   Infusions:   doxycycline (VIBRAMYCIN) IV 100 mg (Sep 10, 2020 1013)    Assessment: Pharmacy consulted to dose heparin for DVT. Due to patient's advanced age and renal dysfunction, will give small bolus and start at with a lower rate. CBC stable at baseline with platelets of 132.   Goal of Therapy:  Heparin level 0.3-0.7 units/ml Monitor platelets by anticoagulation protocol: Yes   Plan:  Bolus with 2000 units heparin Start heparin at 1100  units/hr  Thank you for allowing pharmacy to participate in this patient's care.  Reatha Harps, PharmD PGY1 Pharmacy Resident 12/23/2020 11:08 AM Check [email or web address] for unit specific pharmacy number

## 2020-12-23 NOTE — Evaluation (Signed)
Physical Therapy Evaluation Patient Details Name: Lawrence Warner MRN: [id number] DOB: 1918-07-15 Today's Date: 12/23/2020  History of Present Illness  Pt is a 87 y.o. male admitted 12/21/20 with SOB, cough, fatigue; workup for acute on chronic hypoxic respiratory failure secondary to COPD exacerbation vs COVID-19 vs possible CAP. Pt had (+) COVID test at home on 10/23. PMH includes CHF, COPD (wears 2-3L O2 baseline), afib, HTN, CAD (s/p CABG), DM, skin CA, anxiety, arthritis.   Clinical Impression  Pt presents with an overall decrease in functional mobility secondary to above. PTA, pt resident at [facility], limited ambulator with walker, typically uses electric scooter for mobility; has daily aide assist for medication management, and children check on daily. Today, pt tolerated limited bed mobility with assist; pt declines seated EOB or standing activity secondary to fatigue and significant SOB. Pt would benefit from continued acute PT services to maximize functional mobility and independence prior to d/c with SNF-level therapies.    Recommendations for follow up therapy are one component of a multi-disciplinary discharge planning process, led by the attending physician.  Recommendations may be updated based on patient status, additional functional criteria and insurance authorization.  Follow Up Recommendations Skilled nursing-short term rehab (<3 hours/day)    Assistance Recommended at Discharge Frequent or constant Supervision/Assistance  Functional Status Assessment Patient has had a recent decline in their functional status and demonstrates the ability to make significant improvements in function in a reasonable and predictable amount of time.  Equipment Recommendations   (TBD)    Recommendations for Other Services       Precautions / Restrictions Precautions Precautions: Fall;Other (comment) Precaution Comments: Watch SpO2 (on [place] + face mask) Restrictions Weight  Bearing Restrictions: No      Mobility  Bed Mobility Overal bed mobility: Needs Assistance Bed Mobility: Rolling Rolling: Min assist         General bed mobility comments: MinA to roll R/L for repositioning and pad placement; maxA to scoot up in bed; pt declining attempts to sit EOB secondary to fatigue and SOB, pt with significant SOB at rest    Transfers                   General transfer comment: deferred secondary to significant SOB, cough and fatigue    Ambulation/Gait                Stairs            Wheelchair Mobility    Modified Rankin (Stroke Patients Only)       Balance                                             Pertinent Vitals/Pain Pain Assessment: Faces Faces Pain Scale: Hurts little more Pain Location: general Pain Descriptors / Indicators: Discomfort Pain Intervention(s): Monitored during session;Limited activity within patient's tolerance    Home Living Family/patient expects to be discharged to:: Assisted living                 Home Equipment: Conservation officer, nature (2 wheels);Transport planner Additional Comments: Resident at [facility]. Wears 2-3L O2 baseline    Prior Function Prior Level of Function : Needs assist       Physical Assist : Mobility (physical);ADLs (physical)     Mobility Comments: Ambulates short distances with walker, typically uses electric scooter for mobility;  will scooter to dining hall for meals ADLs Comments: Aide comes daily to assist with medications     Hand Dominance        Extremity/Trunk Assessment   Upper Extremity Assessment Upper Extremity Assessment: Generalized weakness    Lower Extremity Assessment Lower Extremity Assessment: Generalized weakness       Communication   Communication: HOH  Cognition Arousal/Alertness: Awake/alert Behavior During Therapy: WFL for tasks assessed/performed;Flat affect Overall Cognitive Status: Within  Functional Limits for tasks assessed                                 General Comments: WFL for simple tasks; kept questions to a minimum secondary to significant SOB, but answering and joking appropriately, appears fatigued        General Comments General comments (skin integrity, edema, etc.): HR 51, SpO2 88-100% on 4L O2 [place] + 4L O2 face mask (inconsistent reliability of pulse ox pleth). Pt's son and grandson present at end of session    Exercises     Assessment/Plan    PT Assessment Patient needs continued PT services  PT Problem List Decreased strength;Decreased activity tolerance;Decreased balance;Decreased mobility;Decreased knowledge of use of DME;Cardiopulmonary status limiting activity       PT Treatment Interventions DME instruction;Gait training;Functional mobility training;Therapeutic activities;Therapeutic exercise;Balance training;Patient/family education;Wheelchair mobility training    PT Goals (Current goals can be found in the Care Plan section)  Acute Rehab PT Goals Patient Stated Goal: Rest and watch football PT Goal Formulation: With patient Time For Goal Achievement: 01/06/21 Potential to Achieve Goals: Fair    Frequency Min 2X/week   Barriers to discharge Decreased caregiver support      Co-evaluation               AM-PAC PT "6 Clicks" Mobility  Outcome Measure Help needed turning from your back to your side while in a flat bed without using bedrails?: A Lot Help needed moving from lying on your back to sitting on the side of a flat bed without using bedrails?: A Lot Help needed moving to and from a bed to a chair (including a wheelchair)?: A Lot Help needed standing up from a chair using your arms (e.g., wheelchair or bedside chair)?: A Lot Help needed to walk in hospital room?: A Lot Help needed climbing 3-5 steps with a railing? : Total 6 Click Score: 11    End of Session Equipment Utilized During Treatment: Oxygen Activity  Tolerance: Patient limited by fatigue;Treatment limited secondary to medical complications (Comment) Patient left: in bed;with call bell/phone within reach;with bed alarm set;with family/visitor present Nurse Communication: Mobility status PT Visit Diagnosis: Other abnormalities of gait and mobility (R26.89);Muscle weakness (generalized) (M62.81)    Time: 5009-3818 PT Time Calculation (min) (ACUTE ONLY): 17 min   Charges:   PT Evaluation $PT Eval Moderate Complexity: [place], PT, DPT Acute Rehabilitation Services  Pager [phone] Office [place] 12/23/2020, 12:54 PM

## 2020-12-23 NOTE — TOC Initial Note (Signed)
Transition of Care [facility]) - Initial/Assessment Note    Patient Details  Name: Lawrence Warner MRN: [id number] Date of Birth: 1918-06-02  [facility]) CM/SW Contact:    Benard Halsted, LCSW Phone Number: 12/23/2020, 12:46 PM  Clinical Narrative:                 CSW received request from Palliative to speak with patient's daughter in law regarding SNF placement. CSW spoke with [name]. Family is requesting SNF for rehab as patient has been declining at [facility]. She stated patient tested positive for COVID on 12/16/20 so CSW will follow up on if patient needs to remain in [place] isolation, which could impeded SNF options. Patient's first choice would be [facility] as he has been there before. CSW answered questions regarding applying for Medicaid and contacting [employer] for assistance in seeing if there is an ALF in patient's price range if he does not qualify for Medicaid. CSW will follow for therapy recommendations and medical readiness. [facility] is following for outpatient palliative care.   Expected Discharge Plan: Skilled Nursing Facility Barriers to Discharge: Continued Medical Work up, SNF Covid   Patient Goals and CMS Choice Patient states their goals for this hospitalization and ongoing recovery are:: Rehab CMS Medicare.gov Compare Post Acute Care list provided to:: Patient Represenative (must comment) Choice offered to / list presented to : Adult Children (Daughter in Sports coach)  Expected Discharge Plan and Services Expected Discharge Plan: [place] In-house Referral: Clinical Social Work   Post Acute Care Choice: [place] Living arrangements for the past 2 months: [place]                                      Prior Living Arrangements/Services Living arrangements for the past 2 months: [place] Lives with:: Self Patient language and need for interpreter reviewed::  Yes Do you feel safe going back to the place where you live?: No   Becoming too forgetful  Need for Family Participation in Patient Care: Yes (Comment) Care giver support system in place?: Yes (comment)   Criminal Activity/Legal Involvement Pertinent to Current Situation/Hospitalization: No - Comment as needed  Activities of Daily Living      Permission Sought/Granted Permission sought to share information with : Facility Art therapist granted to share information with : Yes, Verbal Permission Granted  Share Information with NAME: [name]  Permission granted to share info w AGENCY: SNFs  Permission granted to share info w Relationship: Daughter in law  Permission granted to share info w Contact Information: [phone]  Emotional Assessment       Orientation: : Oriented to Self, Oriented to Place, Oriented to  Time, Oriented to Situation Alcohol / Substance Use: Not Applicable Psych Involvement: No (comment)  Admission diagnosis:  Acute respiratory failure with hypoxia ([place]) [J96.01] Acute on chronic respiratory failure ([place]) [J96.20] COVID-19 [U07.1] Patient Active Problem List   Diagnosis Date Noted   COVID-19 virus infection 12/21/2020   Elevated troponin 12/21/2020   (HFpEF) heart failure with preserved ejection fraction ([place]) 08/09/2020   Aspiration pneumonia ([place]) 08/09/2020   Arrhythmia 08/09/2020   Leg swelling 03/10/2019   Acute renal failure superimposed on stage 3b chronic kidney disease ([place]) 03/10/2019   Educated about COVID-19 virus infection 03/10/2019   Dyspnea and respiratory abnormalities 01/10/2019   Acute on chronic combined systolic and diastolic heart  failure ([place]) 11/23/2018   Acute exacerbation of CHF (congestive heart failure) ([place]) 11/22/2018   CAD (coronary artery disease) 11/22/2018   QT prolongation 11/22/2018   CHF (congestive heart failure) ([place]) 11/12/2018   Coronary artery disease involving native coronary artery of  native heart without angina pectoris 01/08/2018   Generalized weakness 05/17/2017   Hypertensive emergency 05/17/2017   RSV bronchitis    Bronchospasm with bronchitis, acute 03/14/2017   Acute on chronic diastolic CHF (congestive heart failure) ([place]) 11/26/2016   LBBB (left bundle branch block) 11/26/2016   Hypoxia    Acute on chronic respiratory failure with hypoxia ([place]) 07/20/2016   Chronic respiratory failure with hypoxia ([place]) 02/28/2016   Dyslipidemia 02/28/2016   Sepsis ([place])    Constipation 12/07/2015   Bradycardia 11/28/2015   Port catheter in place 10/24/2015   GERD (gastroesophageal reflux disease) 10/09/2015   Gait instability 10/09/2015   Chest pain 10/05/2015   Diet-controlled diabetes mellitus ([place]) 05/07/2015   Hx of CABG 05/07/2015   Essential hypertension 05/07/2015   Muscle cramps 04/25/2015   Shortness of breath 04/11/2015   Idiopathic thrombocytopenic purpura ([place]) 09/27/2014   PCP:  Wenda Low, MD Pharmacy:   [facility] [place], [place] [address] [place] [address] Phone: [phone] Fax: [phone]     Social Determinants of Health ([place]) Interventions    Readmission Risk Interventions Readmission Risk Prevention Plan 11/24/2018  Transportation Screening Complete  Home Care Screening Complete  Medication Review (RN CM) Complete  Some recent data might be hidden

## 2020-12-23 NOTE — NC FL2 (Signed)
[place] MEDICAID FL2 LEVEL OF CARE SCREENING TOOL     IDENTIFICATION  Patient Name: Lawrence Warner Birthdate: [birth year] Sex: male Admission Date (Current Location): 12/21/2020  [facility] and [place] Number:  Herbalist and Address:  [facility], [place] [address] [place], [place] [address]      Provider Number: [id number]  Attending Physician Name and Address:  Mercy Riding, MD  Relative Name and Phone Number:       Current Level of Care: Hospital Recommended Level of Care: [place] Prior Approval Number:    Date Approved/Denied:   PASRR Number: [id number]  Discharge Plan: SNF    Current Diagnoses: Patient Active Problem List   Diagnosis Date Noted   COVID-19 virus infection 12/21/2020   Elevated troponin 12/21/2020   (HFpEF) heart failure with preserved ejection fraction ([place]) 08/09/2020   Aspiration pneumonia ([place]) 08/09/2020   Arrhythmia 08/09/2020   Leg swelling 03/10/2019   Acute renal failure superimposed on stage 3b chronic kidney disease ([place]) 03/10/2019   Educated about COVID-19 virus infection 03/10/2019   Dyspnea and respiratory abnormalities 01/10/2019   Acute on chronic combined systolic and diastolic heart failure ([place]) 11/23/2018   Acute exacerbation of CHF (congestive heart failure) ([place]) 11/22/2018   CAD (coronary artery disease) 11/22/2018   QT prolongation 11/22/2018   CHF (congestive heart failure) ([place]) 11/12/2018   Coronary artery disease involving native coronary artery of native heart without angina pectoris 01/08/2018   Generalized weakness 05/17/2017   Hypertensive emergency 05/17/2017   RSV bronchitis    Bronchospasm with bronchitis, acute 03/14/2017   Acute on chronic diastolic CHF (congestive heart failure) ([place]) 11/26/2016   LBBB (left bundle branch block) 11/26/2016   Hypoxia    Acute on chronic respiratory failure with hypoxia ([place]) 07/20/2016   Chronic respiratory failure  with hypoxia ([place]) 02/28/2016   Dyslipidemia 02/28/2016   Sepsis ([place])    Constipation 12/07/2015   Bradycardia 11/28/2015   Port catheter in place 10/24/2015   GERD (gastroesophageal reflux disease) 10/09/2015   Gait instability 10/09/2015   Chest pain 10/05/2015   Diet-controlled diabetes mellitus ([place]) 05/07/2015   Hx of CABG 05/07/2015   Essential hypertension 05/07/2015   Muscle cramps 04/25/2015   Shortness of breath 04/11/2015   Idiopathic thrombocytopenic purpura ([place]) 09/27/2014    Orientation RESPIRATION BLADDER Height & Weight     Self, Time, Situation, Place  O2 (nasal cannula 2L) Incontinent, External catheter Weight: 170 lb 10.2 oz (77.4 kg) Height:  5\' 7"  (170.2 cm)  BEHAVIORAL SYMPTOMS/MOOD NEUROLOGICAL BOWEL NUTRITION STATUS      Continent Diet (see dc summary)  AMBULATORY STATUS COMMUNICATION OF NEEDS Skin   Limited Assist Verbally Normal                       Personal Care Assistance Level of Assistance  Bathing, Feeding, Dressing Bathing Assistance: Limited assistance Feeding assistance: Limited assistance Dressing Assistance: Limited assistance     Functional Limitations Info             [place]  PT (By licensed PT), OT (By licensed OT)     PT Frequency: 5x/week OT Frequency: 5x/week            Contractures Contractures Info: Not present    Additional Factors Info  Code Status, Allergies, Insulin Sliding Scale Code Status Info: DNR Allergies Info: Adenosine, Alfuzosin Hcl Er, Ciprofloxacin, Imdur (Isosorbide Dinitrate), Lisinopril, Niacin, Penicillins, Tape, Tuberculin  Purified Protein Derivative   Insulin Sliding Scale Info: See DC Summary       Current Medications (12/23/2020):  This is the current hospital active medication list Current Facility-Administered Medications  Medication Dose Route Frequency Provider Last Rate Last Admin   acetaminophen (TYLENOL) tablet 650 mg  650 mg Oral Q6H PRN Orma Flaming, MD       amLODipine (NORVASC) tablet 10 mg  10 mg Oral Daily Wendee Beavers T, MD   10 mg at 04-07-20 1003   ascorbic acid (VITAMIN C) tablet 500 mg  500 mg Oral Daily Orma Flaming, MD   500 mg at 04-07-20 1003   [START ON 12/24/2020] aspirin EC tablet 81 mg  81 mg Oral Once per day on Sun Wed Wolfe, Allison, MD       cycloSPORINE (RESTASIS) 0.05 % ophthalmic emulsion 1 drop  1 drop Both Eyes Q12H Orma Flaming, MD   1 drop at 04-07-20 1003   doxycycline (VIBRAMYCIN) 100 mg in sodium chloride 0.9 % 250 mL IVPB  100 mg Intravenous Q12H Wendee Beavers T, MD 125 mL/hr at 04-07-20 1013 100 mg at 04-07-20 1013   guaiFENesin-dextromethorphan (ROBITUSSIN DM) 100-10 MG/5ML syrup 10 mL  10 mL Oral Q4H PRN Orma Flaming, MD   10 mL at 12/21/20 2225   heparin ADULT infusion 100 units/mL (25000 units/257mL)  1,100 Units/hr Intravenous Continuous Wise, Nason S, RPH       heparin bolus via infusion 2,000 Units  2,000 Units Intravenous Once Wise, Nason S, RPH       hydrALAZINE (APRESOLINE) tablet 25 mg  25 mg Oral Q6H PRN Mercy Riding, MD       hydrOXYzine (ATARAX/VISTARIL) tablet 25 mg  25 mg Oral QHS Orma Flaming, MD   25 mg at 12/22/20 2145   insulin aspart (novoLOG) injection 0-9 Units  0-9 Units Subcutaneous TID WC Orma Flaming, MD   2 Units at 04-07-20 0606   ipratropium-albuterol (DUONEB) 0.5-2.5 (3) MG/3ML nebulizer solution 3 mL  3 mL Nebulization Q2H PRN Wendee Beavers T, MD   3 mL at 04-07-20 1038   ipratropium-albuterol (DUONEB) 0.5-2.5 (3) MG/3ML nebulizer solution 3 mL  3 mL Nebulization TID Wendee Beavers T, MD       methylPREDNISolone sodium succinate (SOLU-MEDROL) 125 mg/2 mL injection 80 mg  80 mg Intravenous Q24H Gonfa, Taye T, MD       morphine 2 MG/ML injection 1 mg  1 mg Intravenous Q4H PRN Wendee Beavers T, MD   1 mg at 04-07-20 1039   sodium bicarbonate tablet 650 mg  650 mg Oral TID Wendee Beavers T, MD   650 mg at 04-07-20 1003   zinc sulfate capsule 220 mg  220 mg Oral Daily Orma Flaming, MD   220 mg at 04-07-20 1003   Facility-Administered Medications Ordered in Other Encounters  Medication Dose Route Frequency Provider Last Rate Last Admin   cyanocobalamin ((VITAMIN B-12)) injection 1,000 mcg  1,000 mcg Subcutaneous Q30 days [name], MD   1,000 mcg at 03/13/16 1348   cyanocobalamin ((VITAMIN B-12)) injection 1,000 mcg  1,000 mcg Subcutaneous Q30 days [name], MD   1,000 mcg at 05/14/16 1001   cyanocobalamin ((VITAMIN B-12)) injection 1,000 mcg  1,000 mcg Subcutaneous Q30 days [name], MD   1,000 mcg at 09/10/16 1022   cyanocobalamin ((VITAMIN B-12)) injection 1,000 mcg  1,000 mcg Subcutaneous Q30 days [name], MD   1,000 mcg at 12/03/16 1038  Discharge Medications: Please see discharge summary for a list of discharge medications.  Relevant Imaging Results:  Relevant Lab Results:   Additional Information SSN: [id number]. COVID + 10/22  [place], LCSW

## 2020-12-23 NOTE — Consult Note (Signed)
Palliative Medicine Inpatient Consult Note  Consulting Provider: Orma Flaming, MD  Reason for consult:   [facility] Palliative Medicine Consult  Reason for Consult? interested in inpatient hospice   HPI:  Per intake H&P --> 87 year old M with PMH of COPD/chronic hypoxic RF on 2 to 3 L, diastolic CHF, CAD/CABG, DM-2, ITP, CKD-3B, HTN and debility followed by palliative care at home presenting with shortness of breath, productive cough and fatigue for 10 days, and admitted with acute on chronic respiratory failure with hypoxia felt to be due to COPD exacerbation in the setting of COVID-19 infection and possible community-acquired pneumonia.  COVID-19 test positive.  CXR with patchy airspace disease in the right lung.  Troponin 58>>>53.  Patient was a started on systemic steroids, ceftriaxone and azithromycin.  Palliative medicine consulted.  Palliative care has been asked to get involved to discuss goals of care given patient and families interest in inpatient hospice care.  Of note Derec has been followed by outpatient palliative care through [facility] since 2020.  Clinical Assessment/Goals of Care:  *Please note that this is a verbal dictation therefore any spelling or grammatical errors are due to the "[employer] One" system interpretation.  I have reviewed medical records including EPIC notes, labs and imaging, received report from bedside RN, assessed the patient who was sitting up in bed being provided breakfast by his caregiver.  He was alert and oriented to self only this morning.   I called patient's daughter-in-law, [name] and son [name] to further discuss diagnosis prognosis, [place], EOL wishes, disposition and options.  We discussed that Babatunde acute on chronic medical history of congestive heart failure, chronic obstructive pulmonary disease for which she is reliant on oxygen in his apartment, coronary artery bypass graft in the setting of coronary artery  disease, chronic kidney disease, and present [id number] infection complicated by pneumonia.    I introduced Palliative Medicine as specialized medical care for people living with serious illness. It focuses on providing relief from the symptoms and stress of a serious illness. The goal is to improve quality of life for both the patient and the family.  [place] originally from [place] in [place].  He was a member of the [employer] for a short stent though he was never deployed or active in any war.  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  He has a son [name] and a son Juanda Crumble.  He is a man who enjoys lamenting the past and sharing his many stories with anyone who will listen.  He formerly was of the [employer] though his family shares that he is a "lapsed Methodist".  He is presently not a member of any active congregation.  Requan lives at [facility] and one of the independent living apartments.  He has a caregiver come in the morning and evening to provide his medications.  He gets a 11-hour a week stipend through the [place] benefits department for additional caregiving.  His daughter-in-law and son come daily.  He has meals provided and someone helps to clean his room incrementally throughout the week.  He uses an IT trainer wheelchair to get around.  Patient's daughter in law and I reviewed that over the past 6 months [place] has had a decrease in his cognitive  abilities.  He is fallen on several occasions.  He is noted to be staring more and less interactive.  More recently he has been eating very little which has been troubling.  We reviewed the decline that can occur in patients who are elderly who [place] COVID-19 and that it is poorly described though we know many of them suffer from failure to  thrive after infection which can lead to their final demise.  A detailed discussion was had today regarding advanced directives, patient's son, [name] is his primary Media planner.    Concepts specific to code status, artifical feeding and hydration, continued IV antibiotics and rehospitalization was had.  Review of MOST on file as below:  Cardiopulmonary Resuscitation: Do Not Attempt Resuscitation (DNR/No CPR)  Medical Interventions: Limited Additional Interventions: Use medical treatment, IV fluids and cardiac monitoring as indicated, DO NOT USE intubation or mechanical ventilation. May consider use of less invasive airway support such as BiPAP or CPAP. Also provide comfort measures. Transfer to the hospital if indicated. Avoid intensive care.   Antibiotics: Antibiotics if indicated  IV Fluids: IV fluids if indicated  Feeding Tube: No feeding tube   We discussed that Frank had been followed by outpatient palliative support who had been checking in on him monthly as well as a home doctor every 6 weeks.  We reviewed that he has declined as above though every time he comes into the hospital he seems to get the attention he needs in the care he needs and does well.  Patient's daughter-in-law, [name] shares with me that she is concerned in the long run Almyra Free is living alone is no longer going to be a realistic possibility.  She shares willingness to speak to the medical social worker to determine a safer plan for him.  We reviewed that this is a difficult situation given his poor financial state and that him living at [place] is state as long as he has has been supplemented by his other child.    We reviewed different options as they relate to hospice care in terms of outpatient hospice care versus inpatient hospice care.  At this time Charvez is not in a position where he is 2 weeks or less from the end of his life.  [name] and I reviewed that the next best step may be to optimize him further at a  rehabilitation which would enable her and her family time to determine the next steps after having spoken to the Education officer, museum.  Discussed the importance of continued conversation with family and their  medical providers regarding overall plan of care and treatment options, ensuring decisions are within the context of the patients values and GOCs.  Decision Maker: Reeves Musick (son) [phone]  SUMMARY OF RECOMMENDATIONS   DNAR/DNI  MOST/DNR in Vynca  Watchful waiting to see if additional improvements can be made  PT/OT ordered for muscular weakness and family is open to SNF stay  MSW - to help discuss further options moving forward from a living perspective given his need for 24/7 care  TOC - Resume OP Palliative support  Code Status/Advance Care Planning: DNAR/DNI   Palliative Prophylaxis:  Oral Care, Mobility  Additional Recommendations (Limitations, Scope, Preferences): Continue current scope of care to treat what is treatable   Psycho-social/Spiritual:  Desire for further Chaplaincy support: No Additional Recommendations: Education on chronic diseases and COVID-19 infection   Prognosis: Prognosis in the long run would be quite poor if patient continues to not eat and drink sufficiently though  presently he is improving from a cognitive perspective.  Watchful waiting.  Discharge Planning: Discharge will most likely be to skilled nursing with outpatient palliative support through [facility].  Vitals:   12/22/20 2100 01-19-2021 0330  BP: (!) 120/51   Pulse: (!) 51 (!) 54  Resp:  19  Temp: 97.6 F (36.4 C) 97.9 F (36.6 C)  SpO2: 97% 96%    Intake/Output Summary (Last 24 hours) at 12/23/2020 0710 Last data filed at 12/23/2020 0335 Gross per 24 hour  Intake 615.92 ml  Output 375 ml  Net 240.92 ml   Last Weight  Most recent update: 12/23/2020  3:37 AM    Weight  77.4 kg (170 lb 10.2 oz)            Gen: Elderly Caucasian gentleman in no acute  distress HEENT: moist mucous membranes CV: Regular rate and rhythm PULM: On 2 L nasal cannula appears to have even nonlabored breathing ABD: soft/nontender EXT: No edema Neuro: Alert and oriented x1  PPS: 50%   This conversation/these recommendations were discussed with patient primary care team, Dr. Cyndia Skeeters  Time In: 0910 Time Out: 1020 Total Time: 70 Greater than 50%  of this time was spent counseling and coordinating care related to the above assessment and plan.  [place] Team Team Cell Phone: [phone] Please utilize secure chat with additional questions, if there is no response within 30 minutes please call the above phone number  Palliative Medicine Team providers are available by phone from 7am to 7pm daily and can be reached through the team cell phone.  Should this patient require assistance outside of these hours, please call the patient's attending physician.

## 2020-12-23 NOTE — Progress Notes (Signed)
ANTICOAGULATION CONSULT NOTE -Initial  Pharmacy Consult for heparin Indication: DVT  Allergies  Allergen Reactions   Adenosine     Other reaction(s): Heart block   Alfuzosin Hcl Er Other (See Comments)    Confusion/Uroxatral    Ciprofloxacin Nausea Only    Other reaction(s): Nausea, Dizziness, Blurring of visual image, Nausea, Dizziness, Blurring of visual image   Imdur [Isosorbide Dinitrate] Other (See Comments)    Unknown reaction   Lisinopril     Other reaction(s): COUGHING   Niacin     Other reaction(s): Muscle pain   Penicillins Hives and Other (See Comments)    Has patient had a PCN reaction causing immediate rash, facial/tongue/throat swelling, SOB or lightheadedness with hypotension: Yes Has patient had a PCN reaction causing severe rash involving mucus membranes or skin necrosis: Unknown Has patient had a PCN reaction that required hospitalization: Unknown Has patient had a PCN reaction occurring within the last 10 years: Unknown If all of the above answers are "NO", then may proceed with Cephalosporin use.    Tape Other (See Comments)    SKIN IS VERY THIN AND WILL TEAR EASILY   Tuberculin Purified Protein Derivative Hives and Nausea And Vomiting    Other reaction(s): Low blood pressure, Urticaria, Nausea and vomiting    Patient Measurements: Height: 5\' 7"  (170.2 cm) Weight: 77.4 kg (170 lb 10.2 oz) IBW/kg (Calculated) : 66.1 Heparin Dosing Weight: 77.4 kg  Vital Signs: Temp: 97.6 F (36.4 C) (10/29 2000) Temp Source: Oral (10/29 2000) BP: 121/49 (10/29 1700) Pulse Rate: 60 (10/29 2000)  Labs: Recent Labs    12/21/20 1456 12/21/20 1515 12/21/20 1654 12/22/20 0039 12/22/20 0402 2021-01-07 0417 2021-01-07 2017  HGB 12.1* 11.2*  --  11.2*  --  10.1*  --   HCT 39.5 33.0*  --  35.7*  --  33.1*  --   PLT 169  --   --  151  --  132*  --   HEPARINUNFRC  --   --   --   --   --   --  0.31  CREATININE 2.38*  --   --  2.07*  --  1.74*  --   TROPONINIHS 58*  --   53* 67* 68*  --   --      Estimated Creatinine Clearance: 21.1 mL/min (A) (by C-G formula based on SCr of 1.74 mg/dL (H)).   Medications:  Medications Prior to Admission  Medication Sig Dispense Refill Last Dose   acetaminophen (TYLENOL) 500 MG tablet Take 500-1,000 mg by mouth every 6 (six) hours as needed for mild pain.    Past Month   aspirin EC 81 MG tablet Take 81 mg by mouth 2 (two) times a week. Sunday's and Wednesday's   Past Week   Cyanocobalamin (VITAMIN B12 PO) Take 1 tablet by mouth daily.   12/21/2020   cycloSPORINE (RESTASIS) 0.05 % ophthalmic emulsion Place 1 drop into both eyes every 12 (twelve) hours.    Past Week   furosemide (LASIX) 20 MG tablet Take 20 mg by mouth daily.   12/21/2020   hydrOXYzine (ATARAX/VISTARIL) 25 MG tablet Take 25 mg by mouth at bedtime.   Past Week   losartan (COZAAR) 100 MG tablet Take 100 mg by mouth daily.   12/21/2020   OXYGEN Inhale 2.5-3 L into the lungs continuous.   12/21/2020   cephALEXin (KEFLEX) 500 MG capsule Take 1 capsule (500 mg total) by mouth 3 (three) times daily. (Patient not taking: No sig  reported) 21 capsule 0 Completed Course   Scheduled:   amLODipine  10 mg Oral Daily   vitamin C  500 mg Oral Daily   [START ON 12/24/2020] aspirin EC  81 mg Oral Once per day on Sun Wed   cycloSPORINE  1 drop Both Eyes Q12H   hydrOXYzine  25 mg Oral QHS   insulin aspart  0-15 Units Subcutaneous TID WC   insulin aspart  0-5 Units Subcutaneous QHS   ipratropium-albuterol  3 mL Nebulization TID   methylPREDNISolone (SOLU-MEDROL) injection  80 mg Intravenous Q24H   sodium bicarbonate  650 mg Oral TID   zinc sulfate  220 mg Oral Daily   Infusions:   cefTRIAXone (ROCEPHIN)  IV 1 g (2021-01-07 1555)   doxycycline (VIBRAMYCIN) IV 100 mg (2021-01-07 2122)   heparin 1,100 Units/hr (2021-01-07 1300)    Assessment: Pharmacy consulted to dose heparin for possible DVT in the setting of COVID and elevated d-dimer >20. No anticoagulation prior to  admission.   Heparin level 0.31 is at low end of therapeutic on 1100 units/hr units/hr. LE [place] is pending.   Goal of Therapy:  Heparin level 0.3-0.7 units/ml Monitor platelets by anticoagulation protocol: Yes   Plan:  Increase heparin to 1150 units/hr F/u LE [place] result  Monitor daily HL, CBC/plt Monitor for signs/symptoms of bleeding    Thank you for allowing pharmacy to participate in this patient's care.  Benetta Spar, PharmD, BCPS, BCCP Clinical Pharmacist  Please check AMION for all [place] phone numbers After 10:00 PM, call [place] [phone]

## 2020-12-23 NOTE — Progress Notes (Signed)
OT Cancellation Note  Patient Details Name: Lawrence Warner MRN: [id number] DOB: 09-12-1918   Cancelled Treatment:    Reason Eval/Treat Not Completed: Medical issues which prohibited therapy.  Advised by PT patient with limited ability to participate currently.  OT to continue efforts as appropriate.    Lawrence Warner 12/23/2020, 1:45 PM

## 2020-12-23 NOTE — Plan of Care (Signed)

## 2020-12-23 NOTE — Progress Notes (Signed)
[facility]) Hospital Liaison Note  Patient was discharged from [facility] in 9.2022. Spoke with daughter in law [name] to provide information related to hospice services. [name] request that at this time [facility] Palliative services resume at home after discharge.  [facility] hospital liaison will follow patient for discharge disposition.  Please call with any hospice or outpatient palliative care related questions.  Thank you for the opportunity to participate in this patient's care.  Bobbie "Loren Racer, [place], [place] [phone]

## 2020-12-23 NOTE — Progress Notes (Signed)
Speech Language Pathology Treatment: Dysphagia  Patient Details Name: Lawrence Warner MRN: [id number] DOB: 1919-01-18 Today's Date: 12/23/2020 Time: 1638-4665 SLP Time Calculation (min) (ACUTE ONLY): 17 min  Assessment / Plan / Recommendation Clinical Impression  Pt seen for brief follow up for dysphagia in setting of Covid on his 89th birthday! He was cognitively appropriate, awake with face O2 mask. On arrival respirations were mild-moderately labored and pt with inconsistent strong, congested cough. Oral care provided followed by straw presentation of thin water and graham cracker. His cough did not appear to be po related and therapist provided additional time to recover after baseline coughing prior to next sip. Coordinating swallow and respirations is more difficult with Covid and he should take small sips, drink from cup if symptoms present with straw, pills whole in puree and rest breaks. Explained relationship between mastication, swallowing regular textures with respiratory issues. He denied difficulty with breakfast and prefers to continue regular texture. No further ST needed.    HPI HPI: Patient is a 87 y.o. male with PMH: COPD/chronic hypoxic RF on 2 to 3 L, diastolic CHF, CAD/CABG, DM-2, ITP, CKD-3B, HTN and debility followed by palliative care at home presenting with shortness of breath, productive cough and fatigue for 10 days, and admitted with acute on chronic respiratory failure with hypoxia felt to be due to COPD exacerbation in the setting of COVID-19 infection and possible community-acquired pneumonia.  COVID-19 test positive.  CXR with patchy airspace disease in the right lung. He has a productive cough.      SLP Plan  All goals met;Discharge SLP treatment due to (comment)      Recommendations for follow up therapy are one component of a multi-disciplinary discharge planning process, led by the attending physician.  Recommendations may be updated based on patient status,  additional functional criteria and insurance authorization.    Recommendations  Diet recommendations: Regular;Thin liquid Liquids provided via: Cup;Straw Medication Administration: Whole meds with puree Supervision: Staff to assist with self feeding Compensations: Slow rate;Small sips/bites (rest breaks) Postural Changes and/or Swallow Maneuvers: Seated upright 90 degrees                Oral Care Recommendations: Oral care BID Follow up Recommendations: None SLP Visit Diagnosis: Dysphagia, unspecified (R13.10) Plan: All goals met;Discharge SLP treatment due to (comment)       GO                Houston Siren  12/23/2020, 11:26 AM

## 2020-12-23 NOTE — Progress Notes (Signed)
PROGRESS NOTE  Lawrence Warner UEA:[id number] DOB: 10-12-1918   PCP: Wenda Low, MD  [facility] is from: [facility].  Lately wheelchair-bound and not using walker.  DOA: 12/21/2020 LOS: 2  Chief complaints:  Chief Complaint  Patient presents with   Shortness of Breath     Brief Narrative / Interim history: 87 year old M with PMH of COPD/chronic hypoxic RF on 2 to 3 L, diastolic CHF, CAD/CABG, DM-2, ITP, CKD-3B, HTN and debility followed by palliative care at home presenting with shortness of breath, productive cough and fatigue for 10 days, and admitted with acute on chronic respiratory failure with hypoxia felt to be due to COPD exacerbation in the setting of COVID-19 infection and possible community-acquired pneumonia.  COVID-19 test positive.  CXR with patchy airspace disease in the right lung.  Troponin 58>>>53.  Patient was a started on systemic steroids, cefepime and azithromycin.  Palliative medicine consulted.  Subjective: Seen and examined earlier this morning and this afternoon.  He says his breathing is about the same.  Saturation fluctuates.  He denies chest pain, GI or UTI symptoms.   Objective: Vitals:   08/09/2020 0700 08/09/2020 0843 08/09/2020 1200 08/09/2020 1343  BP: (!) 151/63  126/73   Pulse: (!) 49 (!) 52 (!) 56 67  Resp: 20 (!) 31 20 (!) 23  Temp: 97.8 F (36.6 C)  97.6 F (36.4 C)   TempSrc: Oral  Oral   SpO2: 95% 97% 99% 96%  Weight:      Height:        Intake/Output Summary (Last 24 hours) at 12/23/2020 1444 Last data filed at 12/23/2020 0841 Gross per 24 hour  Intake 1031.92 ml  Output 375 ml  Net 656.92 ml   Filed Weights   12/21/20 2101 12/22/20 0600 08/09/2020 0330  Weight: 76.5 kg 76.5 kg 77.4 kg    Examination:  GENERAL: Frail looking elderly male.  No apparent distress at rest HEENT: MMM.  Vision grossly intact.  Hard of hearing. NECK: Supple.  No apparent JVD.  RESP: 100% on 4 L.  Barely speaks in full sentence.  Rhonchorous CVS: Bradycardic  to 67s.  Heart sounds normal.  ABD/GI/GU: BS+. Abd soft, NTND.  MSK/EXT:  Moves extremities. No apparent deformity.  1+ BLE edema. SKIN: no apparent skin lesion or wound NEURO: Awake and alert. Oriented family.  No apparent focal neuro deficit. PSYCH: Calm. Normal affect.    Procedures:  None  Microbiology summarized: COVID-19 PCR positive. For respiratory viral panel negative. Blood culture NGTD.  Assessment & Plan: COPD exacerbation/chronic hypoxic respiratory failure-on 2 to 3 L at baseline.  No documented desaturation but required up to 6 L by [place]. Llikely due to COVID-19 pneumonia.  He thinks he is vaccinated.  Procalcitonin negative arguing against bacterial infection.  Repeat CXR with persistent bilateral opacity, right greater than left.  Family reports cough with swallowing but cleared for regular diet by SLP.  Pro-Cal negative.  D-dimer markedly elevated.  BNP uptrending.  Has work of breathing, rhonchi and edema. -Stop IV fluid -Decrease IV Solu-Medrol to 40 mg twice daily -Trial of IV Lasix 40 mg x 1 -Add IV ceftriaxone for bacterial pneumonia coverage -Continue with low-dose morphine for dyspnea and air hunger -Nebulizers, IS, OOB, PT and OT -Start IV heparin empirically for markedly elevated D-dimer -Palliative medicine consulted.   COVID-19 virus infection: he thinks he is vaccinated.  Symptomatic for 10 days.  Tested positive at home on 12/17/2020.  COVID-19 PCR positive.  CXR as above.  Inflammatory  markers and D-dimer elevated.  Pro-Cal negative. -Management as above -Monitor inflammatory markers -On IV heparin empirically -airborne and contact precaution for 10 days from 12/17/2020  Markedly elevated D-dimer -IV heparin empirically -Check lower extremity venous Doppler +/- VQ scan.  Not a candidate for CTA due to renal function   Sepsis ruled out.   Elevated troponin/history of CAD/CABG: 53 >67>> 68.  Likely demand ischemia and and possible myocarditis from  COVID-19 infection.  Denies chest pain. -No further cardiac work-up indicated.   AKI/azotemia superimposed on CKD-3B: Likely prerenal from poor p.o. intake.  Also an ARB and Lasix at home.  Improved. Recent Labs    08/09/20 0455 12/21/20 1456 12/22/20 0039 07-12-2020 0417  BUN 47* 60* 59* 59*  CREATININE 1.62* 2.38* 2.07* 1.74*  -Stopping IV fluid due to respiratory distress -Continue holding losartan -Recheck renal function in the morning   Acute on chronic diastolic CHF: TTE in 04/2990 with LVEF of 50 to 55%, impaired relaxation.  Appears euvolemic on exam except for BLE edema.  BNP 313 (about baseline)> 840.  Home Lasix was on hold due to AKI.  He was on IV fluid.  Increased work of breathing -IV Lasix 40 mg x 1.  Reassess and redose as appropriate. -Closely monitor fluid and respiratory status -Monitor renal functions and electrolytes  New onset A. Fib with bradycardia:  Not on rate or rhythm control medication. -Avoid nodal blocking agents.   Essential hypertension: Normotensive. -Stop IV fluid -Continue amlodipine -Trial of IV Lasix   Diet-controlled diabetes mellitus: A1c 6.3%.  Hyperglycemia likely due to steroid Recent Labs  Lab 12/22/20 1226 12/22/20 1636 12/22/20 2151 07-12-2020 0559 07-12-2020 1249  GLUCAP 160* 134* 156* 153* 207*  -Decreased to steroid. -Increase SSI to moderate while on steroid -Further adjustment as appropriate.  Hypokalemia: K5.9. -Lokelma 10 g x 1 -IV Lasix as above -Recheck in the morning  Metabolic acidosis-could be due to IV fluid and renal failure. -Stop IV fluid -IV Lasix as above. -P.o. sodium bicarb   Idiopathic thrombocytopenic purpura: Platelet downtrending. Recent Labs  Lab 12/21/20 1456 12/22/20 0039 07-12-2020 0417  PLT 169 151 132*  -Continue monitoring  Skin cancer on face -picks at this when nervous, care order placed to make nurses aware   Anxiety/Panic attack:  -Continue Atarax and as needed  morphine  Ambulatory dysfunction/debility: Likely wheelchair-bound and not using walker. -PT/OT   Goal of care counseling-Seems to be declining with worsening respiratory distress.  Patient is less coherent today.  I have discussed my concern with patient's daughter-in-law, [name].  [name] saw him this morning and understands about his grim prognosis and decline.  If he does not improve with the above intervention (IV Lasix, ceftriaxone, IV heparin, steroid and breathing treatments), we have agreed to focus on his comfort.  -Appreciate help by palliative medicine as well  Body mass index is 26.73 kg/m.         DVT prophylaxis:   On IV heparin.  Code Status: DNR/DNI Family Communication: Updated patient's daughter-in-law, [name] Level of care: Telemetry Medical Status is: Inpatient  Remains inpatient appropriate because: COPD exacerbation, COVID-19 infection and respiratory failure requiring higher level of oxygen, and IV medications   Consultants:  Palliative medicine   Sch Meds:  Scheduled Meds:  amLODipine  10 mg Oral Daily   vitamin C  500 mg Oral Daily   [START ON 12/24/2020] aspirin EC  81 mg Oral Once per day on Sun Wed   cycloSPORINE  1 drop Both  Eyes Q12H   hydrOXYzine  25 mg Oral QHS   insulin aspart  0-15 Units Subcutaneous TID WC   insulin aspart  0-5 Units Subcutaneous QHS   ipratropium-albuterol  3 mL Nebulization TID   methylPREDNISolone (SOLU-MEDROL) injection  80 mg Intravenous Q24H   sodium bicarbonate  650 mg Oral TID   zinc sulfate  220 mg Oral Daily   Continuous Infusions:  cefTRIAXone (ROCEPHIN)  IV     doxycycline (VIBRAMYCIN) IV 100 mg (2021-01-02 1013)   heparin 1,100 Units/hr (2021-01-02 1300)   PRN Meds:.acetaminophen, guaiFENesin-dextromethorphan, hydrALAZINE, ipratropium-albuterol, morphine injection  Antimicrobials: Anti-infectives (From admission, onward)    Start     Dose/Rate Route Frequency Ordered Stop   2021-01-02 1530  cefTRIAXone  (ROCEPHIN) 1 g in sodium chloride 0.9 % 100 mL IVPB        1 g 200 mL/hr over 30 Minutes Intravenous Every 24 hours 2021-01-02 1435     12/22/20 1700  ceFEPIme (MAXIPIME) 2 g in sodium chloride 0.9 % 100 mL IVPB  Status:  Discontinued        2 g 200 mL/hr over 30 Minutes Intravenous Every 24 hours 12/21/20 1646 12/21/20 2023   12/22/20 1700  cefTRIAXone (ROCEPHIN) 1 g in sodium chloride 0.9 % 100 mL IVPB  Status:  Discontinued       Note to Pharmacy: Hives appears to be allergy to PCN. Has had keflex in past, please see if had any other cephalosporins.   1 g 200 mL/hr over 30 Minutes Intravenous Every 24 hours 12/21/20 2025 12/22/20 1258   12/22/20 1000  remdesivir 100 mg in sodium chloride 0.9 % 100 mL IVPB  Status:  Discontinued       See Hyperspace for full Linked Orders Report.   100 mg 200 mL/hr over 30 Minutes Intravenous Daily 12/21/20 1719 12/21/20 1748   12/22/20 1000  azithromycin (ZITHROMAX) 500 mg in sodium chloride 0.9 % 250 mL IVPB  Status:  Discontinued        500 mg 250 mL/hr over 60 Minutes Intravenous Every 24 hours 12/21/20 2025 12/22/20 0848   12/22/20 0945  doxycycline (VIBRAMYCIN) 100 mg in sodium chloride 0.9 % 250 mL IVPB        100 mg 125 mL/hr over 120 Minutes Intravenous Every 12 hours 12/22/20 0848     12/21/20 1730  remdesivir 200 mg in sodium chloride 0.9% 250 mL IVPB  Status:  Discontinued       See Hyperspace for full Linked Orders Report.   200 mg 580 mL/hr over 30 Minutes Intravenous Once 12/21/20 1719 12/21/20 1748   12/21/20 1646  vancomycin variable dose per unstable renal function (pharmacist dosing)  Status:  Discontinued         Does not apply See admin instructions 12/21/20 1646 12/21/20 2023   12/21/20 1630  ceFEPIme (MAXIPIME) 2 g in sodium chloride 0.9 % 100 mL IVPB        2 g 200 mL/hr over 30 Minutes Intravenous  Once 12/21/20 1628 12/21/20 1855   12/21/20 1615  vancomycin (VANCOREADY) IVPB 1500 mg/300 mL        1,500 mg 150 mL/hr over 120  Minutes Intravenous  Once 12/21/20 1611 12/21/20 1932   12/21/20 1615  aztreonam (AZACTAM) 2 g in sodium chloride 0.9 % 100 mL IVPB  Status:  Discontinued        2 g 200 mL/hr over 30 Minutes Intravenous  Once 12/21/20 1611 12/21/20 1622  I have personally reviewed the following labs and images: CBC: Recent Labs  Lab 12/21/20 1456 12/21/20 1515 12/22/20 0039 11-11-2020 0417  WBC 10.8*  --  10.2 12.7*  NEUTROABS 8.8*  --  8.4*  --   HGB 12.1* 11.2* 11.2* 10.1*  HCT 39.5 33.0* 35.7* 33.1*  MCV 96.6  --  93.9 95.4  PLT 169  --  151 132*   BMP &GFR Recent Labs  Lab 12/21/20 1456 12/21/20 1515 12/22/20 0039 11-11-2020 0417  NA 139 142 141 141  K 5.2* 4.8 4.9 5.9*  CL 109  --  113* 115*  CO2 22  --  22 17*  GLUCOSE 146*  --  117* 162*  BUN 60*  --  59* 59*  CREATININE 2.38*  --  2.07* 1.74*  CALCIUM 9.0  --  8.4* 8.6*  MG  --   --  2.3  --   PHOS  --   --  3.2 3.0   Estimated Creatinine Clearance: 21.1 mL/min (A) (by C-G formula based on SCr of 1.74 mg/dL (H)). Liver & Pancreas: Recent Labs  Lab 12/21/20 1456 12/22/20 0039 11-11-2020 0417  AST 22 16  --   ALT 15 13  --   ALKPHOS 56 44  --   BILITOT 0.7 0.7  --   PROT 7.4 6.7  --   ALBUMIN 3.0* 2.6* 2.5*   No results for input(s): LIPASE, AMYLASE in the last 168 hours. No results for input(s): AMMONIA in the last 168 hours. Diabetic: Recent Labs    12/22/20 0039  HGBA1C 6.3*   Recent Labs  Lab 12/22/20 1226 12/22/20 1636 12/22/20 2151 11-11-2020 0559 11-11-2020 1249  GLUCAP 160* 134* 156* 153* 207*   Cardiac Enzymes: No results for input(s): CKTOTAL, CKMB, CKMBINDEX, TROPONINI in the last 168 hours. No results for input(s): PROBNP in the last 8760 hours. Coagulation Profile: No results for input(s): INR, PROTIME in the last 168 hours. Thyroid Function Tests: No results for input(s): TSH, T4TOTAL, FREET4, T3FREE, THYROIDAB in the last 72 hours. Lipid Profile: Recent Labs    12/21/20 1456  TRIG  199*   Anemia Panel: Recent Labs    12/21/20 1456 12/22/20 0039  FERRITIN 505* 408*   Urine analysis:    Component Value Date/Time   COLORURINE YELLOW 11/11/2018 1747   APPEARANCEUR CLEAR 11/11/2018 1747   LABSPEC 1.018 11/11/2018 1747   PHURINE 6.0 11/11/2018 1747   GLUCOSEU NEGATIVE 11/11/2018 1747   HGBUR NEGATIVE 11/11/2018 1747   BILIRUBINUR NEGATIVE 11/11/2018 [place] 11/11/2018 1747   PROTEINUR 100 (A) 11/11/2018 1747   NITRITE NEGATIVE 11/11/2018 1747   LEUKOCYTESUR NEGATIVE 11/11/2018 1747   Sepsis Labs: Invalid input(s): PROCALCITONIN, [place]  Microbiology: Recent Results (from the past 240 hour(s))  Resp Panel by RT-PCR (Flu A&B, Covid) Nasopharyngeal Swab     Status: Abnormal   Collection Time: 12/21/20  2:56 PM   Specimen: Nasopharyngeal Swab; Nasopharyngeal(NP) swabs in vial transport medium  Result Value Ref Range Status   SARS Coronavirus 2 by RT PCR POSITIVE (A) NEGATIVE Final    Comment: RESULT CALLED TO, READ BACK BY AND VERIFIED WITHGareth Eagle RN [id number] 12/21/20 A BROWNING (NOTE) SARS-CoV-2 target nucleic acids are DETECTED.  The SARS-CoV-2 RNA is generally detectable in upper respiratory specimens during the acute phase of infection. Positive results are indicative of the presence of the identified virus, but do not rule out bacterial infection or co-infection with other pathogens not detected by the test.  Clinical correlation with patient history and other diagnostic information is necessary to determine patient infection status. The expected result is Negative.  Fact Sheet for Patients: EntrepreneurPulse.com.au  Fact Sheet for Healthcare Providers: IncredibleEmployment.be  This test is not yet approved or cleared by the [place] FDA and  has been authorized for detection and/or diagnosis of SARS-CoV-2 by FDA under an Emergency Use Authorization (EUA).  This EUA will remain in  effect (meaning this test c an be used) for the duration of  the COVID-19 declaration under Section 564(b)(1) of the Act, 21 U.S.C. section 360bbb-3(b)(1), unless the authorization is terminated or revoked sooner.     Influenza A by PCR NEGATIVE NEGATIVE Final   Influenza B by PCR NEGATIVE NEGATIVE Final    Comment: (NOTE) The Xpert Xpress SARS-CoV-2/FLU/RSV plus assay is intended as an aid in the diagnosis of influenza from Nasopharyngeal swab specimens and should not be used as a sole basis for treatment. Nasal washings and aspirates are unacceptable for Xpert Xpress SARS-CoV-2/FLU/RSV testing.  Fact Sheet for Patients: EntrepreneurPulse.com.au  Fact Sheet for Healthcare Providers: IncredibleEmployment.be  This test is not yet approved or cleared by the [place] FDA and has been authorized for detection and/or diagnosis of SARS-CoV-2 by FDA under an Emergency Use Authorization (EUA). This EUA will remain in effect (meaning this test can be used) for the duration of the COVID-19 declaration under Section 564(b)(1) of the Act, 21 U.S.C. section 360bbb-3(b)(1), unless the authorization is terminated or revoked.  Performed at [facility], [place] [address], [place], [place] [address]   Blood Culture (routine x 2)     Status: None (Preliminary result)   Collection Time: 12/21/20  2:56 PM   Specimen: BLOOD  Result Value Ref Range Status   Specimen Description BLOOD RIGHT ANTECUBITAL  Final   Special Requests   Final    BOTTLES DRAWN AEROBIC AND ANAEROBIC Blood Culture adequate volume   Culture   Final    NO GROWTH 2 DAYS Performed at [facility], [place] [address] [place], [place] [address]    Report Status PENDING  Incomplete  Blood Culture (routine x 2)     Status: None (Preliminary result)   Collection Time: 12/21/20  2:56 PM   Specimen: BLOOD  Result Value Ref Range Status   Specimen Description BLOOD LEFT ANTECUBITAL   Final   Special Requests   Final    BOTTLES DRAWN AEROBIC AND ANAEROBIC Blood Culture adequate volume   Culture   Final    NO GROWTH 2 DAYS Performed at [facility], [place] [address], [place], [place] [address]    Report Status PENDING  Incomplete  Respiratory (~20 pathogens) panel by PCR     Status: None   Collection Time: 12/21/20  8:13 PM   Specimen: Nasopharyngeal Swab; Respiratory  Result Value Ref Range Status   Adenovirus NOT DETECTED NOT DETECTED Final   Coronavirus 229E NOT DETECTED NOT DETECTED Final    Comment: (NOTE) The Coronavirus on the Respiratory Panel, DOES NOT test for the novel  Coronavirus (2019 nCoV)    Coronavirus HKU1 NOT DETECTED NOT DETECTED Final   Coronavirus NL63 NOT DETECTED NOT DETECTED Final   Coronavirus OC43 NOT DETECTED NOT DETECTED Final   Metapneumovirus NOT DETECTED NOT DETECTED Final   Rhinovirus / Enterovirus NOT DETECTED NOT DETECTED Final   Influenza A NOT DETECTED NOT DETECTED Final   Influenza B NOT DETECTED NOT DETECTED Final   Parainfluenza Virus 1  NOT DETECTED NOT DETECTED Final   Parainfluenza Virus 2 NOT DETECTED NOT DETECTED Final   Parainfluenza Virus 3 NOT DETECTED NOT DETECTED Final   Parainfluenza Virus 4 NOT DETECTED NOT DETECTED Final   Respiratory Syncytial Virus NOT DETECTED NOT DETECTED Final   Bordetella pertussis NOT DETECTED NOT DETECTED Final   Bordetella Parapertussis NOT DETECTED NOT DETECTED Final   Chlamydophila pneumoniae NOT DETECTED NOT DETECTED Final   Mycoplasma pneumoniae NOT DETECTED NOT DETECTED Final    Comment: Performed at [facility], [place] [address], [place], [place] [address]    Radiology Studies: DG Chest Port 1 View  Result Date: 12/23/2020 CLINICAL DATA:  Shortness of breath.  COVID positive. EXAM: PORTABLE CHEST 1 VIEW COMPARISON:  12/21/2020 FINDINGS: The cardiac silhouette, mediastinal and hilar contours are stable. Persistent bilateral infiltrates. No definite pleural effusions  or pneumothorax. IMPRESSION: Persistent bilateral infiltrates. Electronically Signed   By: Marijo Sanes M.D.   On: 12/23/2020 14:29       [place] [address] [place]  If 7PM-7AM, please contact night-coverage [email or web address] 12/23/2020, 2:44 PM

## 2020-12-24 ENCOUNTER — Inpatient Hospital Stay (HOSPITAL_COMMUNITY): Payer: Medicare Other

## 2020-12-24 DIAGNOSIS — U071 COVID-19: Secondary | ICD-10-CM

## 2020-12-24 DIAGNOSIS — R7989 Other specified abnormal findings of blood chemistry: Secondary | ICD-10-CM

## 2020-12-24 LAB — PROCALCITONIN: Procalcitonin: <0.1 ng/mL

## 2020-12-24 LAB — GLUCOSE, CAPILLARY
Glucose-Capillary: 176 mg/dL — ABNORMAL HIGH (ref 70–99)
Glucose-Capillary: 164 mg/dL — ABNORMAL HIGH (ref 70–99)
Glucose-Capillary: 129 mg/dL — ABNORMAL HIGH (ref 70–99)
Glucose-Capillary: 115 mg/dL — ABNORMAL HIGH (ref 70–99)

## 2020-12-24 LAB — RENAL FUNCTION PANEL
Albumin: 2.5 g/dL — ABNORMAL LOW (ref 3.5–5.0)
Anion gap: 7 (ref 5–15)
BUN: 55 mg/dL — ABNORMAL HIGH (ref 8–23)
CO2: 22 mmol/L (ref 22–32)
Calcium: 8.9 mg/dL (ref 8.9–10.3)
Chloride: 115 mmol/L — ABNORMAL HIGH (ref 98–111)
Creatinine, Ser: 1.63 mg/dL — ABNORMAL HIGH (ref 0.61–1.24)
GFR, Estimated: 37 mL/min — ABNORMAL LOW (ref >60)
Glucose, Bld: 202 mg/dL — ABNORMAL HIGH (ref 70–99)
Phosphorus: 3.2 mg/dL (ref 2.5–4.6)
Potassium: 5.1 mmol/L (ref 3.5–5.1)
Sodium: 144 mmol/L (ref 135–145)

## 2020-12-24 LAB — CBC
HCT: 31.9 % — ABNORMAL LOW (ref 39.0–52.0)
Hemoglobin: 9.9 g/dL — ABNORMAL LOW (ref 13.0–17.0)
MCH: 29.7 pg (ref 26.0–34.0)
MCHC: 31 g/dL (ref 30.0–36.0)
MCV: 95.8 fL (ref 80.0–100.0)
Platelets: 170 10*3/uL (ref 150–400)
RBC: 3.33 MIL/uL — ABNORMAL LOW (ref 4.22–5.81)
RDW: 13.3 % (ref 11.5–15.5)
WBC: 15.5 10*3/uL — ABNORMAL HIGH (ref 4.0–10.5)
nRBC: 0 % (ref 0.0–0.2)

## 2020-12-24 LAB — HEPARIN LEVEL (UNFRACTIONATED): Heparin Unfractionated: 0.18 IU/mL — ABNORMAL LOW (ref 0.30–0.70)

## 2020-12-24 LAB — BRAIN NATRIURETIC PEPTIDE: B Natriuretic Peptide: 793.6 pg/mL — ABNORMAL HIGH (ref 0.0–100.0)

## 2020-12-24 LAB — C-REACTIVE PROTEIN: CRP: 1 mg/dL — ABNORMAL HIGH (ref <1.0)

## 2020-12-24 LAB — D-DIMER, QUANTITATIVE: D-Dimer, Quant: 1.5 ug/mL-FEU — ABNORMAL HIGH (ref 0.00–0.50)

## 2020-12-24 LAB — MAGNESIUM: Magnesium: 2.1 mg/dL (ref 1.7–2.4)

## 2020-12-24 MED ORDER — IPRATROPIUM-ALBUTEROL 0.5-2.5 (3) MG/3ML IN SOLN
3.0000 mL | Freq: Two times a day (BID) | RESPIRATORY_TRACT | Status: DC
  Administered 2020-12-25: 3 mL via RESPIRATORY_TRACT
  Filled 2020-12-24 (×2): qty 3

## 2020-12-24 MED ORDER — FUROSEMIDE 10 MG/ML IJ SOLN
40.0000 mg | Freq: Every day | INTRAMUSCULAR | Status: DC
  Administered 2020-12-24 – 2020-12-25 (×2): 40 mg via INTRAVENOUS
  Filled 2020-12-24 (×2): qty 4

## 2020-12-24 MED ORDER — HEPARIN SODIUM (PORCINE) 5000 UNIT/ML IJ SOLN
5000.0000 [IU] | Freq: Three times a day (TID) | INTRAMUSCULAR | Status: DC
  Administered 2020-12-24 – 2020-12-28 (×12): 5000 [IU] via SUBCUTANEOUS
  Filled 2020-12-24 (×12): qty 1

## 2020-12-24 NOTE — Progress Notes (Signed)
PROGRESS NOTE  Lawrence Warner QMV:[id number] DOB: 06-24-1918   PCP: Wenda Low, MD  [facility] is from: [facility].  Lately wheelchair-bound and not using walker.  DOA: 12/21/2020 LOS: 3  Chief complaints:  Chief Complaint  Patient presents with   Shortness of Breath     Brief Narrative / Interim history: 87 year old M with PMH of COPD/chronic hypoxic RF on 2 to 3 L, diastolic CHF, CAD/CABG, DM-2, ITP, CKD-3B, HTN and debility followed by palliative care at home presenting with shortness of breath, productive cough and fatigue for 10 days, and admitted with acute on chronic respiratory failure with hypoxia felt to be due to COPD exacerbation in the setting of COVID-19 infection and possible community-acquired pneumonia.  COVID-19 test positive.  CXR with patchy airspace disease in the right lung.  Troponin 58>>>53.  Patient was a started on systemic steroids, cefepime and azithromycin.    Patient had worsening respiratory status.  IV fluid discontinued.  Started on IV Lasix.  Palliative medicine consulted.  Subjective: Seen and examined earlier this morning.  No major events overnight of this morning.  Breathing better but states feeling "like wild cat".  He denies chest pain.  Coughs intermittently.  Objective: Vitals:   12/24/20 0900 12/24/20 0907 12/24/20 1000 12/24/20 1100  BP: 101/61  128/67 (!) 137/57  Pulse: 65  61 66  Resp: (!) 21  (!) 23   Temp:      TempSrc:      SpO2: 100% 100% 95% 99%  Weight:      Height:        Intake/Output Summary (Last 24 hours) at 12/24/2020 1458 Last data filed at 12/24/2020 1100 Gross per 24 hour  Intake 881.14 ml  Output 1100 ml  Net -218.86 ml   Filed Weights   12/22/20 0600 November 23, 2020 0330 12/24/20 0300  Weight: 76.5 kg 77.4 kg 72.6 kg    Examination:  GENERAL: Frail looking elderly male.  No apparent distress. HEENT: MMM.  Vision and hearing grossly intact.  NECK: Supple.  Notable JVD. RESP: 99% on 2 L.  No IWOB.  Crackles  bilaterally.  Coughing intermittently. CVS:  RRR. Heart sounds normal.  ABD/GI/GU: BS+. Abd soft, NTND.  MSK/EXT:  Moves extremities. No apparent deformity.  Trace edema bilaterally. SKIN: no apparent skin lesion or wound NEURO: Awake and alert. Oriented appropriately.  No apparent focal neuro deficit. PSYCH: Calm. Normal affect.    Procedures:  None  Microbiology summarized: COVID-19 PCR positive. For respiratory viral panel negative. Blood culture NGTD.  Assessment & Plan: COPD exacerbation/chronic hypoxic respiratory failure-on 2 to 3 L at baseline.  Likely due to COVID-19 -Continue steroid, antibiotics, nebulizers, IS, OOB, PT and OT -Continue low-dose morphine for anxiety related to air hunger -Wean oxygen as able   COVID-19 virus infection:  Symptomatic for 2 weeks POA.  Tested positive at home on 12/17/2020.  COVID-19 PCR positive.  CXR as above.  Inflammatory markers and D-dimer elevated but downtrending.  Pro-Cal negative. -Continue steroid and breathing treatment as above.  Acute on chronic diastolic CHF: TTE in 03/8411 with LVEF of 50 to 55%, impaired relaxation.  Has JVD with bilateral crackles today.  BNP 313 (b/l)> 840 > 800.  Started on IV Lasix.  Had about 1.1 L UOP overnight.  Breathing improved. -Continue IV Lasix 40 mg daily -Monitor fluid status, renal functions and electrolytes -Monitor renal functions and electrolytes -Repeat chest x-ray in the morning  Markedly elevated D-dimer-reported to be > 20 on 10/29.  Likely erroneous  lab.  2.10> 1.6>> 20> 1.5.  LE [place] negative for DVT. -Discontinue IV heparin -Subcu heparin for VT prophylaxis   Sepsis ruled out.   Elevated troponin/history of CAD/CABG: 53 >67>> 68.  Likely demand ischemia and and possible myocarditis from COVID-19 infection.  Denies chest pain. -No further cardiac work-up indicated.   AKI/azotemia superimposed on CKD-3B: Initially felt to be prerenal but improved with IV Lasix. Recent Labs     08/09/20 0455 12/21/20 1456 12/22/20 0039 2020/11/28 0417 12/24/20 0952  BUN 47* 60* 59* 59* 55*  CREATININE 1.62* 2.38* 2.07* 1.74* 1.63*  -Continue IV Lasix -Continue holding losartan -Recheck renal function in the morning   New onset A. Fib with bradycardia:  Not on rate or rhythm control medication. -Avoid nodal blocking agents.   Essential hypertension: Normotensive. -Continue amlodipine and IV Lasix. -Continue holding losartan   Diet-controlled diabetes mellitus: A1c 6.3%.  Hyperglycemia likely due to steroid Recent Labs  Lab 2020/11/28 1249 2020/11/28 1604 2020/11/28 2100 12/24/20 0554 12/24/20 1208  GLUCAP 207* 181* 156* 176* 164*  -Decreased steroid. -Continue SSI-moderate -Further adjustment as appropriate.  Hypokalemia: Resolved.  Metabolic acidosis-could be due to IV fluid and renal failure. -Resolved.   Idiopathic thrombocytopenic purpura: Resolved. Recent Labs  Lab 12/21/20 1456 12/22/20 0039 2020/11/28 0417 12/24/20 0952  PLT 169 151 132* 170  -Continue monitoring  Skin cancer on face -picks at this when nervous, care order placed to make nurses aware   Anxiety/Panic attack:  -Continue Atarax and as needed morphine  Ambulatory dysfunction/debility: Likely wheelchair-bound and not using walker. -PT/OT   Goal of care counseling-continue medical management as above.  Emphasis on comfort if further decline -Appreciate help by palliative medicine as well  Body mass index is 25.07 kg/m.         DVT prophylaxis:   On IV heparin.  Code Status: DNR/DNI Family Communication: Updated patient's daughter-in-law, [name] Level of care: Telemetry Medical Status is: Inpatient  Remains inpatient appropriate because: COPD exacerbation, COVID-19 infection and respiratory failure requiring higher level of oxygen, and IV medications   Consultants:  Palliative medicine   Sch Meds:  Scheduled Meds:  amLODipine  10 mg Oral Daily   vitamin C  500 mg Oral  Daily   aspirin EC  81 mg Oral Once per day on Sun Wed   cycloSPORINE  1 drop Both Eyes Q12H   furosemide  40 mg Intravenous Daily   hydrOXYzine  25 mg Oral QHS   insulin aspart  0-15 Units Subcutaneous TID WC   insulin aspart  0-5 Units Subcutaneous QHS   ipratropium-albuterol  3 mL Nebulization TID   methylPREDNISolone (SOLU-MEDROL) injection  80 mg Intravenous Q24H   sodium bicarbonate  650 mg Oral TID   zinc sulfate  220 mg Oral Daily   Continuous Infusions:  cefTRIAXone (ROCEPHIN)  IV 1 g (2020/11/28 1555)   doxycycline (VIBRAMYCIN) IV 100 mg (12/24/20 1119)   heparin 1,300 Units/hr (12/24/20 1227)   PRN Meds:.acetaminophen, guaiFENesin-dextromethorphan, hydrALAZINE, ipratropium-albuterol, morphine injection  Antimicrobials: Anti-infectives (From admission, onward)    Start     Dose/Rate Route Frequency Ordered Stop   2020/11/28 1530  cefTRIAXone (ROCEPHIN) 1 g in sodium chloride 0.9 % 100 mL IVPB        1 g 200 mL/hr over 30 Minutes Intravenous Every 24 hours 2020/11/28 1435     12/22/20 1700  ceFEPIme (MAXIPIME) 2 g in sodium chloride 0.9 % 100 mL IVPB  Status:  Discontinued  2 g 200 mL/hr over 30 Minutes Intravenous Every 24 hours 12/21/20 1646 12/21/20 2023   12/22/20 1700  cefTRIAXone (ROCEPHIN) 1 g in sodium chloride 0.9 % 100 mL IVPB  Status:  Discontinued       Note to Pharmacy: Hives appears to be allergy to PCN. Has had keflex in past, please see if had any other cephalosporins.   1 g 200 mL/hr over 30 Minutes Intravenous Every 24 hours 12/21/20 2025 12/22/20 1258   12/22/20 1000  remdesivir 100 mg in sodium chloride 0.9 % 100 mL IVPB  Status:  Discontinued       See Hyperspace for full Linked Orders Report.   100 mg 200 mL/hr over 30 Minutes Intravenous Daily 12/21/20 1719 12/21/20 1748   12/22/20 1000  azithromycin (ZITHROMAX) 500 mg in sodium chloride 0.9 % 250 mL IVPB  Status:  Discontinued        500 mg 250 mL/hr over 60 Minutes Intravenous Every 24 hours  12/21/20 2025 12/22/20 0848   12/22/20 0945  doxycycline (VIBRAMYCIN) 100 mg in sodium chloride 0.9 % 250 mL IVPB        100 mg 125 mL/hr over 120 Minutes Intravenous Every 12 hours 12/22/20 0848     12/21/20 1730  remdesivir 200 mg in sodium chloride 0.9% 250 mL IVPB  Status:  Discontinued       See Hyperspace for full Linked Orders Report.   200 mg 580 mL/hr over 30 Minutes Intravenous Once 12/21/20 1719 12/21/20 1748   12/21/20 1646  vancomycin variable dose per unstable renal function (pharmacist dosing)  Status:  Discontinued         Does not apply See admin instructions 12/21/20 1646 12/21/20 2023   12/21/20 1630  ceFEPIme (MAXIPIME) 2 g in sodium chloride 0.9 % 100 mL IVPB        2 g 200 mL/hr over 30 Minutes Intravenous  Once 12/21/20 1628 12/21/20 1855   12/21/20 1615  vancomycin (VANCOREADY) IVPB 1500 mg/300 mL        1,500 mg 150 mL/hr over 120 Minutes Intravenous  Once 12/21/20 1611 12/21/20 1932   12/21/20 1615  aztreonam (AZACTAM) 2 g in sodium chloride 0.9 % 100 mL IVPB  Status:  Discontinued        2 g 200 mL/hr over 30 Minutes Intravenous  Once 12/21/20 1611 12/21/20 1622        I have personally reviewed the following labs and images: CBC: Recent Labs  Lab 12/21/20 1456 12/21/20 1515 12/22/20 0039 2020-05-01 0417 12/24/20 0952  WBC 10.8*  --  10.2 12.7* 15.5*  NEUTROABS 8.8*  --  8.4*  --   --   HGB 12.1* 11.2* 11.2* 10.1* 9.9*  HCT 39.5 33.0* 35.7* 33.1* 31.9*  MCV 96.6  --  93.9 95.4 95.8  PLT 169  --  151 132* 170   BMP &GFR Recent Labs  Lab 12/21/20 1456 12/21/20 1515 12/22/20 0039 2020-05-01 0417 12/24/20 0952  NA 139 142 141 141 144  K 5.2* 4.8 4.9 5.9* 5.1  CL 109  --  113* 115* 115*  CO2 22  --  22 17* 22  GLUCOSE 146*  --  117* 162* 202*  BUN 60*  --  59* 59* 55*  CREATININE 2.38*  --  2.07* 1.74* 1.63*  CALCIUM 9.0  --  8.4* 8.6* 8.9  MG  --   --  2.3 2.2 2.1  PHOS  --   --  3.2 3.0 3.2  Estimated Creatinine Clearance: 22.5 mL/min  (A) (by C-G formula based on SCr of 1.63 mg/dL (H)). Liver & Pancreas: Recent Labs  Lab 12/21/20 1456 12/22/20 0039 08/04/2020 0417 12/24/20 0952  AST 22 16  --   --   ALT 15 13  --   --   ALKPHOS 56 44  --   --   BILITOT 0.7 0.7  --   --   PROT 7.4 6.7  --   --   ALBUMIN 3.0* 2.6* 2.5* 2.5*   No results for input(s): LIPASE, AMYLASE in the last 168 hours. No results for input(s): AMMONIA in the last 168 hours. Diabetic: Recent Labs    12/22/20 0039  HGBA1C 6.3*   Recent Labs  Lab 08/04/2020 1249 08/04/2020 1604 08/04/2020 2100 12/24/20 0554 12/24/20 1208  GLUCAP 207* 181* 156* 176* 164*   Cardiac Enzymes: No results for input(s): CKTOTAL, CKMB, CKMBINDEX, TROPONINI in the last 168 hours. No results for input(s): PROBNP in the last 8760 hours. Coagulation Profile: No results for input(s): INR, PROTIME in the last 168 hours. Thyroid Function Tests: No results for input(s): TSH, T4TOTAL, FREET4, T3FREE, THYROIDAB in the last 72 hours. Lipid Profile: No results for input(s): CHOL, HDL, LDLCALC, TRIG, CHOLHDL, LDLDIRECT in the last 72 hours.  Anemia Panel: Recent Labs    12/22/20 0039  FERRITIN 408*   Urine analysis:    Component Value Date/Time   COLORURINE YELLOW 11/11/2018 1747   APPEARANCEUR CLEAR 11/11/2018 1747   LABSPEC 1.018 11/11/2018 1747   PHURINE 6.0 11/11/2018 1747   GLUCOSEU NEGATIVE 11/11/2018 1747   HGBUR NEGATIVE 11/11/2018 1747   BILIRUBINUR NEGATIVE 11/11/2018 [place] 11/11/2018 1747   PROTEINUR 100 (A) 11/11/2018 1747   NITRITE NEGATIVE 11/11/2018 1747   LEUKOCYTESUR NEGATIVE 11/11/2018 1747   Sepsis Labs: Invalid input(s): PROCALCITONIN, [place]  Microbiology: Recent Results (from the past 240 hour(s))  Resp Panel by RT-PCR (Flu A&B, Covid) Nasopharyngeal Swab     Status: Abnormal   Collection Time: 12/21/20  2:56 PM   Specimen: Nasopharyngeal Swab; Nasopharyngeal(NP) swabs in vial transport medium  Result Value Ref  Range Status   SARS Coronavirus 2 by RT PCR POSITIVE (A) NEGATIVE Final    Comment: RESULT CALLED TO, READ BACK BY AND VERIFIED WITHGareth Eagle RN [id number] 12/21/20 A BROWNING (NOTE) SARS-CoV-2 target nucleic acids are DETECTED.  The SARS-CoV-2 RNA is generally detectable in upper respiratory specimens during the acute phase of infection. Positive results are indicative of the presence of the identified virus, but do not rule out bacterial infection or co-infection with other pathogens not detected by the test. Clinical correlation with patient history and other diagnostic information is necessary to determine patient infection status. The expected result is Negative.  Fact Sheet for Patients: EntrepreneurPulse.com.au  Fact Sheet for Healthcare Providers: IncredibleEmployment.be  This test is not yet approved or cleared by the [place] FDA and  has been authorized for detection and/or diagnosis of SARS-CoV-2 by FDA under an Emergency Use Authorization (EUA).  This EUA will remain in effect (meaning this test c an be used) for the duration of  the COVID-19 declaration under Section 564(b)(1) of the Act, 21 U.S.C. section 360bbb-3(b)(1), unless the authorization is terminated or revoked sooner.     Influenza A by PCR NEGATIVE NEGATIVE Final   Influenza B by PCR NEGATIVE NEGATIVE Final    Comment: (NOTE) The Xpert Xpress SARS-CoV-2/FLU/RSV plus assay is intended as an aid in the diagnosis of influenza  from Nasopharyngeal swab specimens and should not be used as a sole basis for treatment. Nasal washings and aspirates are unacceptable for Xpert Xpress SARS-CoV-2/FLU/RSV testing.  Fact Sheet for Patients: EntrepreneurPulse.com.au  Fact Sheet for Healthcare Providers: IncredibleEmployment.be  This test is not yet approved or cleared by the [place] FDA and has been authorized for detection and/or  diagnosis of SARS-CoV-2 by FDA under an Emergency Use Authorization (EUA). This EUA will remain in effect (meaning this test can be used) for the duration of the COVID-19 declaration under Section 564(b)(1) of the Act, 21 U.S.C. section 360bbb-3(b)(1), unless the authorization is terminated or revoked.  Performed at [facility], [place] [address], [place], [place] [address]   Blood Culture (routine x 2)     Status: None (Preliminary result)   Collection Time: 12/21/20  2:56 PM   Specimen: BLOOD  Result Value Ref Range Status   Specimen Description BLOOD RIGHT ANTECUBITAL  Final   Special Requests   Final    BOTTLES DRAWN AEROBIC AND ANAEROBIC Blood Culture adequate volume   Culture   Final    NO GROWTH 3 DAYS Performed at [facility], [place] [address] [place], [place] [address]    Report Status PENDING  Incomplete  Blood Culture (routine x 2)     Status: None (Preliminary result)   Collection Time: 12/21/20  2:56 PM   Specimen: BLOOD  Result Value Ref Range Status   Specimen Description BLOOD LEFT ANTECUBITAL  Final   Special Requests   Final    BOTTLES DRAWN AEROBIC AND ANAEROBIC Blood Culture adequate volume   Culture   Final    NO GROWTH 3 DAYS Performed at [facility], [place] [address] [place], [place] [address]    Report Status PENDING  Incomplete  Respiratory (~20 pathogens) panel by PCR     Status: None   Collection Time: 12/21/20  8:13 PM   Specimen: Nasopharyngeal Swab; Respiratory  Result Value Ref Range Status   Adenovirus NOT DETECTED NOT DETECTED Final   Coronavirus 229E NOT DETECTED NOT DETECTED Final    Comment: (NOTE) The Coronavirus on the Respiratory Panel, DOES NOT test for the novel  Coronavirus (2019 nCoV)    Coronavirus HKU1 NOT DETECTED NOT DETECTED Final   Coronavirus NL63 NOT DETECTED NOT DETECTED Final   Coronavirus OC43 NOT DETECTED NOT DETECTED Final   Metapneumovirus NOT DETECTED NOT DETECTED Final   Rhinovirus / Enterovirus  NOT DETECTED NOT DETECTED Final   Influenza A NOT DETECTED NOT DETECTED Final   Influenza B NOT DETECTED NOT DETECTED Final   Parainfluenza Virus 1 NOT DETECTED NOT DETECTED Final   Parainfluenza Virus 2 NOT DETECTED NOT DETECTED Final   Parainfluenza Virus 3 NOT DETECTED NOT DETECTED Final   Parainfluenza Virus 4 NOT DETECTED NOT DETECTED Final   Respiratory Syncytial Virus NOT DETECTED NOT DETECTED Final   Bordetella pertussis NOT DETECTED NOT DETECTED Final   Bordetella Parapertussis NOT DETECTED NOT DETECTED Final   Chlamydophila pneumoniae NOT DETECTED NOT DETECTED Final   Mycoplasma pneumoniae NOT DETECTED NOT DETECTED Final    Comment: Performed at [facility], [place]. [address] [place], [place] [address]    Radiology Studies: VAS [place] LOWER EXTREMITY VENOUS (DVT)  Result Date: 12/24/2020  Lower Venous DVT Study Patient Name:  Lawrence Warner  Date of Exam:   12/24/2020 Medical Rec #: [id number]         Accession #:    [id number]  Date of Birth: Jul 03, 1918        Patient Gender: M Patient Age:   89 years Exam Location:  [facility] Procedure:      VAS [place] LOWER EXTREMITY VENOUS (DVT) Referring Phys: Bretta Bang Derita Michelsen --------------------------------------------------------------------------------  Indications: Covid, elevated D-dimer.  Comparison Study: Prior bilateral lower extremity venous duplex done 05/19/2017 Performing Technologist: Sharion Dove RVS  Examination Guidelines: A complete evaluation includes B-mode imaging, spectral Doppler, color Doppler, and power Doppler as needed of all accessible portions of each vessel. Bilateral testing is considered an integral part of a complete examination. Limited examinations for reoccurring indications may be performed as noted. The reflux portion of the exam is performed with the patient in reverse Trendelenburg.  +---------+---------------+---------+-----------+----------+------------------+ RIGHT     CompressibilityPhasicitySpontaneityPropertiesThrombus Aging     +---------+---------------+---------+-----------+----------+------------------+ CFV      Full                                         pulsatile waveform +---------+---------------+---------+-----------+----------+------------------+ SFJ      Full                                                            +---------+---------------+---------+-----------+----------+------------------+ FV Prox  Full                                                            +---------+---------------+---------+-----------+----------+------------------+ FV Mid   Full                                                            +---------+---------------+---------+-----------+----------+------------------+ FV DistalFull                                                            +---------+---------------+---------+-----------+----------+------------------+ PFV      Full                                                            +---------+---------------+---------+-----------+----------+------------------+ POP      Full                                         pulsatile waveform +---------+---------------+---------+-----------+----------+------------------+ PTV      Full                                                            +---------+---------------+---------+-----------+----------+------------------+  PERO     Full                                                            +---------+---------------+---------+-----------+----------+------------------+   +---------+---------------+---------+-----------+----------+------------------+ LEFT     CompressibilityPhasicitySpontaneityPropertiesThrombus Aging     +---------+---------------+---------+-----------+----------+------------------+ CFV      Full                                         pulsatile waveform  +---------+---------------+---------+-----------+----------+------------------+ SFJ      Full                                                            +---------+---------------+---------+-----------+----------+------------------+ FV Prox  Full                                                            +---------+---------------+---------+-----------+----------+------------------+ FV Mid   Full                                                            +---------+---------------+---------+-----------+----------+------------------+ FV DistalFull                                                            +---------+---------------+---------+-----------+----------+------------------+ PFV      Full                                                            +---------+---------------+---------+-----------+----------+------------------+ POP      Full                                         pulsatile waveform +---------+---------------+---------+-----------+----------+------------------+ PTV      Full                                                            +---------+---------------+---------+-----------+----------+------------------+ PERO     Full                                                            +---------+---------------+---------+-----------+----------+------------------+  Summary: BILATERAL: - No evidence of deep vein thrombosis seen in the lower extremities, bilaterally. - RIGHT: pulsatile waveforms suggestive of fluid overload  LEFT: Pulsatile waveforms suggestive of fluid overload.  *See table(s) above for measurements and observations.    Preliminary        Anup Brigham T. [place]  If 7PM-7AM, please contact night-coverage [email or web address] 12/24/2020, 2:58 PM

## 2020-12-24 NOTE — Progress Notes (Signed)
ANTICOAGULATION CONSULT NOTE -Initial  Pharmacy Consult for heparin Indication: DVT  Allergies  Allergen Reactions   Adenosine     Other reaction(s): Heart block   Alfuzosin Hcl Er Other (See Comments)    Confusion/Uroxatral    Ciprofloxacin Nausea Only    Other reaction(s): Nausea, Dizziness, Blurring of visual image, Nausea, Dizziness, Blurring of visual image   Imdur [Isosorbide Dinitrate] Other (See Comments)    Unknown reaction   Lisinopril     Other reaction(s): COUGHING   Niacin     Other reaction(s): Muscle pain   Penicillins Hives and Other (See Comments)    Has patient had a PCN reaction causing immediate rash, facial/tongue/throat swelling, SOB or lightheadedness with hypotension: Yes Has patient had a PCN reaction causing severe rash involving mucus membranes or skin necrosis: Unknown Has patient had a PCN reaction that required hospitalization: Unknown Has patient had a PCN reaction occurring within the last 10 years: Unknown If all of the above answers are "NO", then may proceed with Cephalosporin use.    Tape Other (See Comments)    SKIN IS VERY THIN AND WILL TEAR EASILY   Tuberculin Purified Protein Derivative Hives and Nausea And Vomiting    Other reaction(s): Low blood pressure, Urticaria, Nausea and vomiting    Patient Measurements: Height: 5\' 7"  (170.2 cm) Weight: 72.6 kg (160 lb 0.9 oz) IBW/kg (Calculated) : 66.1 Heparin Dosing Weight: 77.4 kg  Vital Signs: Temp: 97.7 F (36.5 C) (10/30 0810) Temp Source: Oral (10/30 0810) BP: 137/57 (10/30 1100) Pulse Rate: 66 (10/30 1100)  Labs: Recent Labs    12/21/20 1654 12/22/20 0039 12/22/20 0402 Oct 21, 2020 0417 Oct 21, 2020 2017 12/24/20 0952  HGB  --  11.2*  --  10.1*  --  9.9*  HCT  --  35.7*  --  33.1*  --  31.9*  PLT  --  151  --  132*  --  170  HEPARINUNFRC  --   --   --   --  0.31 0.18*  CREATININE  --  2.07*  --  1.74*  --  1.63*  TROPONINIHS 53* 67* 68*  --   --   --      Estimated  Creatinine Clearance: 22.5 mL/min (A) (by C-G formula based on SCr of 1.63 mg/dL (H)).   Medications:  Medications Prior to Admission  Medication Sig Dispense Refill Last Dose   acetaminophen (TYLENOL) 500 MG tablet Take 500-1,000 mg by mouth every 6 (six) hours as needed for mild pain.    Past Month   aspirin EC 81 MG tablet Take 81 mg by mouth 2 (two) times a week. Sunday's and Wednesday's   Past Week   Cyanocobalamin (VITAMIN B12 PO) Take 1 tablet by mouth daily.   12/21/2020   cycloSPORINE (RESTASIS) 0.05 % ophthalmic emulsion Place 1 drop into both eyes every 12 (twelve) hours.    Past Week   furosemide (LASIX) 20 MG tablet Take 20 mg by mouth daily.   12/21/2020   hydrOXYzine (ATARAX/VISTARIL) 25 MG tablet Take 25 mg by mouth at bedtime.   Past Week   losartan (COZAAR) 100 MG tablet Take 100 mg by mouth daily.   12/21/2020   OXYGEN Inhale 2.5-3 L into the lungs continuous.   12/21/2020   cephALEXin (KEFLEX) 500 MG capsule Take 1 capsule (500 mg total) by mouth 3 (three) times daily. (Patient not taking: No sig reported) 21 capsule 0 Completed Course   Scheduled:   amLODipine  10 mg Oral  Daily   vitamin C  500 mg Oral Daily   aspirin EC  81 mg Oral Once per day on Sun Wed   cycloSPORINE  1 drop Both Eyes Q12H   hydrOXYzine  25 mg Oral QHS   insulin aspart  0-15 Units Subcutaneous TID WC   insulin aspart  0-5 Units Subcutaneous QHS   ipratropium-albuterol  3 mL Nebulization TID   methylPREDNISolone (SOLU-MEDROL) injection  80 mg Intravenous Q24H   sodium bicarbonate  650 mg Oral TID   zinc sulfate  220 mg Oral Daily   Infusions:   cefTRIAXone (ROCEPHIN)  IV 1 g (07/14/20 1555)   doxycycline (VIBRAMYCIN) IV 100 mg (12/24/20 1119)   heparin 1,150 Units/hr (12/24/20 0604)    Assessment: Pharmacy consulted to dose heparin for possible DVT in the setting of COVID and elevated d-dimer >20. No anticoagulation prior to admission.   Heparin level is subtherapeutic at 0.18 on 1150  units/hr units/hr. LE US performed, but not yet read. Will follow up results and primary teams recommendations for anticoagulation. CBC stable and no signs or symptoms of bleeding reported.  Goal of Therapy:  Heparin level 0.3-0.7 units/ml Monitor platelets by anticoagulation protocol: Yes   Plan:  Increase heparin to 1300 units/hr F/u LE [place] result  Check 8 hour heparin level Monitor daily HL, CBC/plt Monitor for signs/symptoms of bleeding   Thank you for allowing pharmacy to participate in this patient's care.  Reatha Harps, PharmD PGY1 Pharmacy Resident 12/24/2020 11:52 AM Check [email or web address] for unit specific pharmacy number

## 2020-12-24 NOTE — Progress Notes (Signed)
OT Cancellation Note  Patient Details Name: Lawrence Warner MRN: [id number] DOB: 04/15/1918   Cancelled Treatment:    Reason Eval/Treat Not Completed: Other (comment). Pt on Heparin and not therapeutic today, LE ultrasound pending.   [place], OTR/L Acute Rehab Services Pager [phone] Office [phone]    Almon Register 12/24/2020, 12:49 PM

## 2020-12-24 NOTE — Progress Notes (Signed)
VASCULAR LAB    Bilateral lower extremity venous duplex has been performed.  See CV proc for preliminary results.   Delora Gravatt, RVT 12/24/2020, 11:28 AM

## 2020-12-24 NOTE — Progress Notes (Signed)
Palliative Medicine Inpatient Follow Up Note  Consulting Provider: Orma Flaming, MD   Reason for consult:   Lawrence Warner Palliative Medicine Consult  Reason for Consult? interested in inpatient hospice    HPI:  Per intake H&P --> 87 year old M with PMH of COPD/chronic hypoxic RF on 2 to 3 L, diastolic CHF, CAD/CABG, DM-2, ITP, CKD-3B, HTN and debility followed by palliative care at home presenting with shortness of breath, productive cough and fatigue for 10 days, and admitted with acute on chronic respiratory failure with hypoxia felt to be due to COPD exacerbation in the setting of COVID-19 infection and possible community-acquired pneumonia.  COVID-19 test positive.  CXR with patchy airspace disease in the right lung.  Troponin 58>>>53.  Patient was a started on systemic steroids, ceftriaxone and azithromycin.  Palliative medicine consulted.   Palliative care has been asked to get involved to discuss goals of care given patient and families interest in inpatient hospice care.   Of note Lawrence Warner has been followed by outpatient palliative care through [facility] since 2020.  Today's Discussion (12/24/2020):  *Please note that this is a verbal dictation therefore any spelling or grammatical errors are due to the "[place] [facility]" system interpretation.  Chart reviewed.  Plan will be for short-term SNF placement through social work note review.  PT and OT have worked with Lawrence Warner and recommend skilled nursing.  I met with Lawrence Warner at bedside this morning.  He is more alert today.  He can tell me who he is and where he is.  We reviewed that he is here in the setting of COVID-19 and shortness of breath.  He did seem to understand this.  We reviewed the importance of getting up and mobilizing.  This morning he declined getting to the chair as he shares "I am cold".  He said he would be willing to do this later on in the morning.  Questions and concerns addressed    Objective Assessment: Vital Signs Vitals:   12/24/20 0810 12/24/20 0907  BP: (!) 118/56   Pulse: (!) 59   Resp: 16   Temp: 97.7 F (36.5 C)   SpO2: 98% 100%    Intake/Output Summary (Last 24 hours) at 12/24/2020 0954 Last data filed at 12/23/2020 2332 Gross per 24 hour  Intake 1131.52 ml  Output 1100 ml  Net 31.52 ml   Last Weight  Most recent update: 12/24/2020  3:12 AM    Weight  72.6 kg (160 lb 0.9 oz)            Gen: Elderly Caucasian gentleman in no acute distress HEENT: moist mucous membranes CV: Regular rate and rhythm PULM: On 2 L nasal cannula appears to have even nonlabored breathing ABD: soft/nontender EXT: No edema Neuro: Alert and oriented x1  SUMMARY OF RECOMMENDATIONS   DNAR/DNI   MOST/DNR in [facility]    PT/OT ordered for muscular weakness  Plan for skilled nursing facility placement   TOC - Resume OP Palliative support through [facility] upon discharge  Time Spent: 25 Greater than 50% of the time was spent in counseling and coordination of care ______________________________________________________________________________________ [place] Team Team Cell Phone: [phone] Please utilize secure chat with additional questions, if there is no response within 30 minutes please call the above phone number  Palliative Medicine Team providers are available by phone from 7am to 7pm daily and can be reached through the team cell phone.  Should this patient require assistance outside of  these hours, please call the patient's attending physician.

## 2020-12-24 NOTE — Plan of Care (Signed)

## 2020-12-25 ENCOUNTER — Inpatient Hospital Stay (HOSPITAL_COMMUNITY): Payer: Medicare Other

## 2020-12-25 LAB — D-DIMER, QUANTITATIVE: D-Dimer, Quant: 1.9 ug{FEU}/mL — ABNORMAL HIGH (ref 0.00–0.50)

## 2020-12-25 LAB — BRAIN NATRIURETIC PEPTIDE: B Natriuretic Peptide: 914.8 pg/mL — ABNORMAL HIGH (ref 0.0–100.0)

## 2020-12-25 LAB — CBC
HCT: 33.2 % — ABNORMAL LOW (ref 39.0–52.0)
Hemoglobin: 10.4 g/dL — ABNORMAL LOW (ref 13.0–17.0)
MCH: 29.5 pg (ref 26.0–34.0)
MCHC: 31.3 g/dL (ref 30.0–36.0)
MCV: 94.3 fL (ref 80.0–100.0)
Platelets: 169 10*3/uL (ref 150–400)
RBC: 3.52 MIL/uL — ABNORMAL LOW (ref 4.22–5.81)
RDW: 13.4 % (ref 11.5–15.5)
WBC: 12.1 10*3/uL — ABNORMAL HIGH (ref 4.0–10.5)
nRBC: 0 % (ref 0.0–0.2)

## 2020-12-25 LAB — RENAL FUNCTION PANEL
Albumin: 2.4 g/dL — ABNORMAL LOW (ref 3.5–5.0)
Anion gap: 5 (ref 5–15)
BUN: 61 mg/dL — ABNORMAL HIGH (ref 8–23)
CO2: 24 mmol/L (ref 22–32)
Calcium: 9.1 mg/dL (ref 8.9–10.3)
Chloride: 112 mmol/L — ABNORMAL HIGH (ref 98–111)
Creatinine, Ser: 1.5 mg/dL — ABNORMAL HIGH (ref 0.61–1.24)
GFR, Estimated: 41 mL/min — ABNORMAL LOW
Glucose, Bld: 181 mg/dL — ABNORMAL HIGH (ref 70–99)
Phosphorus: 2.6 mg/dL (ref 2.5–4.6)
Potassium: 5 mmol/L (ref 3.5–5.1)
Sodium: 141 mmol/L (ref 135–145)

## 2020-12-25 LAB — GLUCOSE, CAPILLARY
Glucose-Capillary: 180 mg/dL — ABNORMAL HIGH (ref 70–99)
Glucose-Capillary: 190 mg/dL — ABNORMAL HIGH (ref 70–99)
Glucose-Capillary: 147 mg/dL — ABNORMAL HIGH (ref 70–99)
Glucose-Capillary: 140 mg/dL — ABNORMAL HIGH (ref 70–99)

## 2020-12-25 LAB — C-REACTIVE PROTEIN: CRP: 0.7 mg/dL (ref <1.0)

## 2020-12-25 LAB — MAGNESIUM: Magnesium: 2 mg/dL (ref 1.7–2.4)

## 2020-12-25 MED ORDER — DOXYCYCLINE HYCLATE 100 MG PO TABS
100.0000 mg | ORAL_TABLET | Freq: Two times a day (BID) | ORAL | Status: AC
  Administered 2020-12-25 – 2020-12-27 (×5): 100 mg via ORAL
  Filled 2020-12-25 (×5): qty 1

## 2020-12-25 MED ORDER — ENSURE ENLIVE PO LIQD
237.0000 mL | Freq: Three times a day (TID) | ORAL | Status: DC
  Administered 2020-12-25 – 2020-12-28 (×6): 237 mL via ORAL

## 2020-12-25 MED ORDER — ADULT MULTIVITAMIN W/MINERALS CH
1.0000 | ORAL_TABLET | Freq: Every day | ORAL | Status: DC
  Administered 2020-12-25 – 2020-12-28 (×4): 1 via ORAL
  Filled 2020-12-25 (×4): qty 1

## 2020-12-25 NOTE — Progress Notes (Signed)
Initial Nutrition Assessment  DOCUMENTATION CODES:  Not applicable  INTERVENTION:  Add Ensure Plus High Protein po TID, each supplement provides 350 kcal and 20 grams of protein.   Add MVI with minerals daily.  Encourage PO intake.  NUTRITION DIAGNOSIS:  Increased nutrient needs related to acute illness (COVID-19 infection) as evidenced by estimated needs.  GOAL:  Patient will meet greater than or equal to 90% of their needs  MONITOR:  PO intake, Weight trends, Supplement acceptance, Labs, I & O's  REASON FOR ASSESSMENT:  Consult Assessment of nutrition requirement/status  ASSESSMENT:  87 yo male with a PMH of COPD/chronic hypoxic RF on 2 to 3 L, diastolic CHF, CAD/CABG, DM-2, ITP, CKD-3B, HTN and debility followed by palliative care at home presenting with shortness of breath, productive cough and fatigue for 10 days, and admitted with acute on chronic respiratory failure with hypoxia felt to be due to COPD exacerbation in the setting of COVID-19 infection and possible community-acquired pneumonia.  Pt unavailable at time of RD visit. Spoke with RN about pt.   RN reports pt ate none of the eggs, all of his oatmeal, 75% peaches, and 50% of an Ensure this morning for breakfast. He reports to her that he eats what he can.  Per Epic, pt ate 100% of breakfast and 25% of dinner on 10/29.  Pt with no recent weight history in Epic. Per Care Everywhere, pt has lost ~17 lbs (9.2%) in the last 4 months, which is significant and severe for the time frame.  Pt likely has some degree of malnutrition, but RD unable to definitively diagnose at this time.  Recommend Ensure TID and MVI with minerals daily.  Medications: reviewed; Vitamin C, Lasix, SSI, Solu-Medrol, Na Bicarb TID, zinc sulfate  Labs: reviewed; BUN 61 (H - trending up), Crt 1.50 (H - trending down), CBG 115-190 (H) HbA1c: 6.3% (12/22/2020)  NUTRITION - FOCUSED PHYSICAL EXAM: Unable to perform - defer to follow-up  Diet  Order:   Diet Order             Diet heart healthy/carb modified Room service appropriate? Yes with Assist; Fluid consistency: Thin; Fluid restriction: 1500 mL Fluid  Diet effective now                  EDUCATION NEEDS:  Education needs have been addressed  Skin:  Skin Assessment: Reviewed RN Assessment  Last BM:  12/21/20  Height:  Ht Readings from Last 1 Encounters:  12/21/20 5\' 7"  (1.702 m)   Weight:  Wt Readings from Last 1 Encounters:  12/25/20 76.6 kg   BMI:  Body mass index is 26.45 kg/m.  Estimated Nutritional Needs:  Kcal:  1700-1900 Protein:  85-100 grams Fluid:  >1.7 L  Derrel Nip, RD, LDN (she/her/hers) Registered Dietitian I Pager #: [phone] After-Hours/Weekend Pager # in [place]

## 2020-12-25 NOTE — Progress Notes (Signed)
PROGRESS NOTE  Lawrence Warner ZJQ:[id number] DOB: 08/24/1918   PCP: Wenda Low, MD  Patient is from: [facility].  Lately wheelchair-bound and not using walker.  DOA: 12/21/2020 LOS: 4  Chief complaints:  Chief Complaint  Patient presents with   Shortness of Breath     Brief Narrative / Interim history: 87 year old M with PMH of COPD/chronic hypoxic RF on 2 to 3 L, diastolic CHF, CAD/CABG, DM-2, ITP, CKD-3B, HTN and debility followed by palliative care at home presenting with shortness of breath, productive cough and fatigue for 10 days, and admitted with acute on chronic respiratory failure with hypoxia felt to be due to COPD exacerbation in the setting of COVID-19 infection and possible community-acquired pneumonia.  COVID-19 test positive.  CXR with patchy airspace disease in the right lung.  Troponin 58>>>53.  Patient was a started on systemic steroids, cefepime and azithromycin.    Patient had worsening respiratory status.  IV fluid discontinued.  Started on IV Lasix with improvement in his breathing but prognosis remains poor.  Palliative medicine following.  Subjective: Seen and examined earlier this morning.  No major events overnight of this morning.  Sitting in bed eating breakfast.  RN helping with feeding.  Reports improvement in his breathing.  Coughs intermittently.  He denies chest pain, nausea, vomiting, abdominal pain or UTI symptoms.  Remains very weak.   Objective: Vitals:   12/25/20 0400 12/25/20 0500 12/25/20 0600 12/25/20 0846  BP: 140/78 136/72 (!) 140/56 (!) 156/53  Pulse: (!) 55   67  Resp: 17   (!) 24  Temp: 97.7 F (36.5 C)   (!) 97.5 F (36.4 C)  TempSrc: Oral   Oral  SpO2: 94%   97%  Weight:   76.6 kg   Height:        Intake/Output Summary (Last 24 hours) at 12/25/2020 1353 Last data filed at 12/25/2020 0600 Gross per 24 hour  Intake 990 ml  Output 1700 ml  Net -710 ml   Filed Weights   03/16/20 0330 12/24/20 0300 12/25/20 0600  Weight: 77.4  kg 72.6 kg 76.6 kg    Examination:  GENERAL: Frail looking elderly male.  Looks very deconditioned HEENT: MMM.  Vision and hearing grossly intact.  NECK: Supple.  No apparent JVD.  RESP: 94% on 2 L.  Slightly tachypneic.  No IWOB.  Fair aeration bilaterally. CVS:  RRR. Heart sounds normal.  ABD/GI/GU: BS+. Abd soft, NTND.  MSK/EXT:  Moves extremities. No apparent deformity. No edema.  SKIN: no apparent skin lesion or wound NEURO: Awake and alert. Oriented fairly.  No apparent focal neuro deficit but globally weak. PSYCH: Calm. Normal affect.   Procedures:  None  Microbiology summarized: COVID-19 PCR positive. For respiratory viral panel negative. Blood culture NGTD.  Assessment & Plan: COPD exacerbation/chronic hypoxic respiratory failure-on 2 to 3 L at baseline.  Likely due to COVID-19 -Continue steroid, CTX, doxycycline, nebs, IS, OOB, PT/OT -Continue low-dose morphine for anxiety related to air hunger -Wean oxygen as able   COVID-19 virus infection:  Symptomatic for 2 weeks POA.  Tested positive at home on 12/17/2020.  COVID-19 PCR positive.  CXR as above.  CRP normalized.  D-dimer slightly elevated.  Pro-Cal negative. -Continue steroid and breathing treatment as above.  Acute on chronic diastolic CHF: TTE in 03/4095 with LVEF of 50 to 55%, impaired relaxation.  Has JVD with bilateral crackles today.  Started on IV Lasix with improvement in his breathing.  About 1.7 L UOP/24 hours.  Still net  positive although fluid status is improved.  BNP 313 (b/l)> >>958 concerning for poor prognosis.  Renal function improved.  Repeat CXR about the same. -Continue IV Lasix 40 mg daily -Monitor fluid status, renal functions and electrolytes -Monitor renal functions and electrolytes  Markedly elevated D-dimer-reported to be > 20 on 10/29.  Likely erroneous lab.  2.10> 1.6>> 20> 1.5.  LE [place] negative for DVT. -Change IV heparin to subcu for VTE prophylaxis.   Sepsis ruled out.   Elevated  troponin/history of CAD/CABG: 53 >67>> 68.  Likely demand ischemia and and possible myocarditis from COVID-19 infection.  Denies chest pain. -No further cardiac work-up indicated.   AKI/azotemia superimposed on CKD-3B: Initially felt to be prerenal but improved with IV Lasix. Recent Labs    08/09/20 0455 12/21/20 1456 12/22/20 0039 2020-07-21 0417 12/24/20 0952 12/25/20 0339  BUN 47* 60* 59* 59* 55* 61*  CREATININE 1.62* 2.38* 2.07* 1.74* 1.63* 1.50*  -Continue IV Lasix -Continue holding losartan -Recheck renal function in the morning   New onset A. Fib with bradycardia:  Not on rate or rhythm control medication. -Avoid nodal blocking agents.   Essential hypertension: Normotensive. -Continue amlodipine and IV Lasix. -Continue holding losartan   Diet-controlled diabetes mellitus: A1c 6.3%.  Hyperglycemia likely due to steroid Recent Labs  Lab 12/24/20 1208 12/24/20 1722 12/24/20 2144 12/25/20 0609 12/25/20 1208  GLUCAP 164* 129* 115* 180* 190*  -Decreased steroid. -Continue SSI-moderate -Further adjustment as appropriate.  Hypokalemia: Resolved.  Metabolic acidosis-could be due to IV fluid and renal failure. -Resolved.   Idiopathic thrombocytopenic purpura: Resolved. Recent Labs  Lab 12/21/20 1456 12/22/20 0039 2020-07-21 0417 12/24/20 0952 12/25/20 0339  PLT 169 151 132* 170 169  -Continue monitoring  Skin cancer on face -picks at this when nervous, care order placed to make nurses aware   Anxiety/Panic attack:  -Continue Atarax and as needed morphine  Ambulatory dysfunction/debility: Likely wheelchair-bound and not using walker. -Therapy recommended SNF   Goal of care counseling-continue medical management as above.  Emphasis on comfort if further decline -Appreciate help by palliative medicine as well  Decreased oral intake Body mass index is 26.45 kg/m.  -Dietitian consulted.       DVT prophylaxis:  heparin injection 5,000 Units Start: 12/24/20  1545 On IV heparin.  Code Status: DNR/DNI Family Communication: Updated patient's daughter-in-law, [name] Level of care: Telemetry Medical Status is: Inpatient  Remains inpatient appropriate because: COPD exacerbation, COVID-19 infection and respiratory failure requiring higher level of oxygen, and IV medications   Consultants:  Palliative medicine   Sch Meds:  Scheduled Meds:  amLODipine  10 mg Oral Daily   vitamin C  500 mg Oral Daily   aspirin EC  81 mg Oral Once per day on Sun Wed   cycloSPORINE  1 drop Both Eyes Q12H   doxycycline  100 mg Oral Q12H   furosemide  40 mg Intravenous Daily   heparin injection (subcutaneous)  5,000 Units Subcutaneous Q8H   hydrOXYzine  25 mg Oral QHS   insulin aspart  0-15 Units Subcutaneous TID WC   insulin aspart  0-5 Units Subcutaneous QHS   ipratropium-albuterol  3 mL Nebulization BID   methylPREDNISolone (SOLU-MEDROL) injection  80 mg Intravenous Q24H   sodium bicarbonate  650 mg Oral TID   zinc sulfate  220 mg Oral Daily   Continuous Infusions:  cefTRIAXone (ROCEPHIN)  IV 1 g (12/24/20 1704)   PRN Meds:.acetaminophen, guaiFENesin-dextromethorphan, hydrALAZINE, ipratropium-albuterol, morphine injection  Antimicrobials: Anti-infectives (From admission, onward)  Start     Dose/Rate Route Frequency Ordered Stop   12/25/20 2200  doxycycline (VIBRA-TABS) tablet 100 mg        100 mg Oral Every 12 hours 12/25/20 0828     25-Sep-2020 1530  cefTRIAXone (ROCEPHIN) 1 g in sodium chloride 0.9 % 100 mL IVPB        1 g 200 mL/hr over 30 Minutes Intravenous Every 24 hours 25-Sep-2020 1435     12/22/20 1700  ceFEPIme (MAXIPIME) 2 g in sodium chloride 0.9 % 100 mL IVPB  Status:  Discontinued        2 g 200 mL/hr over 30 Minutes Intravenous Every 24 hours 12/21/20 1646 12/21/20 2023   12/22/20 1700  cefTRIAXone (ROCEPHIN) 1 g in sodium chloride 0.9 % 100 mL IVPB  Status:  Discontinued       Note to Pharmacy: Hives appears to be allergy to PCN. Has had  keflex in past, please see if had any other cephalosporins.   1 g 200 mL/hr over 30 Minutes Intravenous Every 24 hours 12/21/20 2025 12/22/20 1258   12/22/20 1000  remdesivir 100 mg in sodium chloride 0.9 % 100 mL IVPB  Status:  Discontinued       See Hyperspace for full Linked Orders Report.   100 mg 200 mL/hr over 30 Minutes Intravenous Daily 12/21/20 1719 12/21/20 1748   12/22/20 1000  azithromycin (ZITHROMAX) 500 mg in sodium chloride 0.9 % 250 mL IVPB  Status:  Discontinued        500 mg 250 mL/hr over 60 Minutes Intravenous Every 24 hours 12/21/20 2025 12/22/20 0848   12/22/20 0945  doxycycline (VIBRAMYCIN) 100 mg in sodium chloride 0.9 % 250 mL IVPB        100 mg 125 mL/hr over 120 Minutes Intravenous Every 12 hours 12/22/20 0848 12/25/20 1040   12/21/20 1730  remdesivir 200 mg in sodium chloride 0.9% 250 mL IVPB  Status:  Discontinued       See Hyperspace for full Linked Orders Report.   200 mg 580 mL/hr over 30 Minutes Intravenous Once 12/21/20 1719 12/21/20 1748   12/21/20 1646  vancomycin variable dose per unstable renal function (pharmacist dosing)  Status:  Discontinued         Does not apply See admin instructions 12/21/20 1646 12/21/20 2023   12/21/20 1630  ceFEPIme (MAXIPIME) 2 g in sodium chloride 0.9 % 100 mL IVPB        2 g 200 mL/hr over 30 Minutes Intravenous  Once 12/21/20 1628 12/21/20 1855   12/21/20 1615  vancomycin (VANCOREADY) IVPB 1500 mg/300 mL        1,500 mg 150 mL/hr over 120 Minutes Intravenous  Once 12/21/20 1611 12/21/20 1932   12/21/20 1615  aztreonam (AZACTAM) 2 g in sodium chloride 0.9 % 100 mL IVPB  Status:  Discontinued        2 g 200 mL/hr over 30 Minutes Intravenous  Once 12/21/20 1611 12/21/20 1622        I have personally reviewed the following labs and images: CBC: Recent Labs  Lab 12/21/20 1456 12/21/20 1515 12/22/20 0039 25-Sep-2020 0417 12/24/20 0952 12/25/20 0339  WBC 10.8*  --  10.2 12.7* 15.5* 12.1*  NEUTROABS 8.8*  --  8.4*   --   --   --   HGB 12.1* 11.2* 11.2* 10.1* 9.9* 10.4*  HCT 39.5 33.0* 35.7* 33.1* 31.9* 33.2*  MCV 96.6  --  93.9 95.4 95.8 94.3  PLT 169  --  151 132* 170 169   BMP &GFR Recent Labs  Lab 12/21/20 1456 12/21/20 1515 12/22/20 0039 05/12/2020 0417 12/24/20 0952 12/25/20 0339  NA 139 142 141 141 144 141  K 5.2* 4.8 4.9 5.9* 5.1 5.0  CL 109  --  113* 115* 115* 112*  CO2 22  --  22 17* 22 24  GLUCOSE 146*  --  117* 162* 202* 181*  BUN 60*  --  59* 59* 55* 61*  CREATININE 2.38*  --  2.07* 1.74* 1.63* 1.50*  CALCIUM 9.0  --  8.4* 8.6* 8.9 9.1  MG  --   --  2.3 2.2 2.1 2.0  PHOS  --   --  3.2 3.0 3.2 2.6   Estimated Creatinine Clearance: 24.5 mL/min (A) (by C-G formula based on SCr of 1.5 mg/dL (H)). Liver & Pancreas: Recent Labs  Lab 12/21/20 1456 12/22/20 0039 05/12/2020 0417 12/24/20 0952 12/25/20 0339  AST 22 16  --   --   --   ALT 15 13  --   --   --   ALKPHOS 56 44  --   --   --   BILITOT 0.7 0.7  --   --   --   PROT 7.4 6.7  --   --   --   ALBUMIN 3.0* 2.6* 2.5* 2.5* 2.4*   No results for input(s): LIPASE, AMYLASE in the last 168 hours. No results for input(s): AMMONIA in the last 168 hours. Diabetic: No results for input(s): HGBA1C in the last 72 hours.  Recent Labs  Lab 12/24/20 1208 12/24/20 1722 12/24/20 2144 12/25/20 0609 12/25/20 1208  GLUCAP 164* 129* 115* 180* 190*   Cardiac Enzymes: No results for input(s): CKTOTAL, CKMB, CKMBINDEX, TROPONINI in the last 168 hours. No results for input(s): PROBNP in the last 8760 hours. Coagulation Profile: No results for input(s): INR, PROTIME in the last 168 hours. Thyroid Function Tests: No results for input(s): TSH, T4TOTAL, FREET4, T3FREE, THYROIDAB in the last 72 hours. Lipid Profile: No results for input(s): CHOL, HDL, LDLCALC, TRIG, CHOLHDL, LDLDIRECT in the last 72 hours.  Anemia Panel: No results for input(s): VITAMINB12, FOLATE, FERRITIN, TIBC, IRON, RETICCTPCT in the last 72 hours.  Urine  analysis:    Component Value Date/Time   COLORURINE YELLOW 11/11/2018 1747   APPEARANCEUR CLEAR 11/11/2018 1747   LABSPEC 1.018 11/11/2018 1747   PHURINE 6.0 11/11/2018 1747   GLUCOSEU NEGATIVE 11/11/2018 1747   HGBUR NEGATIVE 11/11/2018 1747   BILIRUBINUR NEGATIVE 11/11/2018 [place] 11/11/2018 1747   PROTEINUR 100 (A) 11/11/2018 1747   NITRITE NEGATIVE 11/11/2018 1747   LEUKOCYTESUR NEGATIVE 11/11/2018 1747   Sepsis Labs: Invalid input(s): PROCALCITONIN, [place]  Microbiology: Recent Results (from the past 240 hour(s))  Resp Panel by RT-PCR (Flu A&B, Covid) Nasopharyngeal Swab     Status: Abnormal   Collection Time: 12/21/20  2:56 PM   Specimen: Nasopharyngeal Swab; Nasopharyngeal(NP) swabs in vial transport medium  Result Value Ref Range Status   SARS Coronavirus 2 by RT PCR POSITIVE (A) NEGATIVE Final    Comment: RESULT CALLED TO, READ BACK BY AND VERIFIED WITHGareth Eagle RN [id number] 12/21/20 A BROWNING (NOTE) SARS-CoV-2 target nucleic acids are DETECTED.  The SARS-CoV-2 RNA is generally detectable in upper respiratory specimens during the acute phase of infection. Positive results are indicative of the presence of the identified virus, but do not rule out bacterial infection or co-infection with other pathogens not detected by the test. Clinical correlation  with patient history and other diagnostic information is necessary to determine patient infection status. The expected result is Negative.  Fact Sheet for Patients: EntrepreneurPulse.com.au  Fact Sheet for Healthcare Providers: IncredibleEmployment.be  This test is not yet approved or cleared by the [place] FDA and  has been authorized for detection and/or diagnosis of SARS-CoV-2 by FDA under an Emergency Use Authorization (EUA).  This EUA will remain in effect (meaning this test c an be used) for the duration of  the COVID-19 declaration under Section  564(b)(1) of the Act, 21 U.S.C. section 360bbb-3(b)(1), unless the authorization is terminated or revoked sooner.     Influenza A by PCR NEGATIVE NEGATIVE Final   Influenza B by PCR NEGATIVE NEGATIVE Final    Comment: (NOTE) The Xpert Xpress SARS-CoV-2/FLU/RSV plus assay is intended as an aid in the diagnosis of influenza from Nasopharyngeal swab specimens and should not be used as a sole basis for treatment. Nasal washings and aspirates are unacceptable for Xpert Xpress SARS-CoV-2/FLU/RSV testing.  Fact Sheet for Patients: EntrepreneurPulse.com.au  Fact Sheet for Healthcare Providers: IncredibleEmployment.be  This test is not yet approved or cleared by the [place] FDA and has been authorized for detection and/or diagnosis of SARS-CoV-2 by FDA under an Emergency Use Authorization (EUA). This EUA will remain in effect (meaning this test can be used) for the duration of the COVID-19 declaration under Section 564(b)(1) of the Act, 21 U.S.C. section 360bbb-3(b)(1), unless the authorization is terminated or revoked.  Performed at [facility], [place] [address] [place], [place] [address]   Blood Culture (routine x 2)     Status: None (Preliminary result)   Collection Time: 12/21/20  2:56 PM   Specimen: BLOOD  Result Value Ref Range Status   Specimen Description BLOOD RIGHT ANTECUBITAL  Final   Special Requests   Final    BOTTLES DRAWN AEROBIC AND ANAEROBIC Blood Culture adequate volume   Culture   Final    NO GROWTH 4 DAYS Performed at [facility], [place] [address] [place], [place] [address]    Report Status PENDING  Incomplete  Blood Culture (routine x 2)     Status: None (Preliminary result)   Collection Time: 12/21/20  2:56 PM   Specimen: BLOOD  Result Value Ref Range Status   Specimen Description BLOOD LEFT ANTECUBITAL  Final   Special Requests   Final    BOTTLES DRAWN AEROBIC AND ANAEROBIC Blood Culture adequate  volume   Culture   Final    NO GROWTH 4 DAYS Performed at [facility], [place] [address], [place], [place] [address]    Report Status PENDING  Incomplete  Respiratory (~20 pathogens) panel by PCR     Status: None   Collection Time: 12/21/20  8:13 PM   Specimen: Nasopharyngeal Swab; Respiratory  Result Value Ref Range Status   Adenovirus NOT DETECTED NOT DETECTED Final   Coronavirus 229E NOT DETECTED NOT DETECTED Final    Comment: (NOTE) The Coronavirus on the Respiratory Panel, DOES NOT test for the novel  Coronavirus (2019 nCoV)    Coronavirus HKU1 NOT DETECTED NOT DETECTED Final   Coronavirus NL63 NOT DETECTED NOT DETECTED Final   Coronavirus OC43 NOT DETECTED NOT DETECTED Final   Metapneumovirus NOT DETECTED NOT DETECTED Final   Rhinovirus / Enterovirus NOT DETECTED NOT DETECTED Final   Influenza A NOT DETECTED NOT DETECTED Final   Influenza B NOT DETECTED NOT DETECTED Final   Parainfluenza Virus 1 NOT DETECTED  NOT DETECTED Final   Parainfluenza Virus 2 NOT DETECTED NOT DETECTED Final   Parainfluenza Virus 3 NOT DETECTED NOT DETECTED Final   Parainfluenza Virus 4 NOT DETECTED NOT DETECTED Final   Respiratory Syncytial Virus NOT DETECTED NOT DETECTED Final   Bordetella pertussis NOT DETECTED NOT DETECTED Final   Bordetella Parapertussis NOT DETECTED NOT DETECTED Final   Chlamydophila pneumoniae NOT DETECTED NOT DETECTED Final   Mycoplasma pneumoniae NOT DETECTED NOT DETECTED Final    Comment: Performed at [facility], [place] [address] [place], [place] [address]    Radiology Studies: DG Chest Port 1 View  Result Date: 12/25/2020 CLINICAL DATA:  Dyspnea.  Cough.  History of COPD and COVID. EXAM: PORTABLE CHEST 1 VIEW COMPARISON:  12/23/2020; 12/21/2020; 08/19/2020; 01/09/2019 FINDINGS: Grossly unchanged enlarged cardiac silhouette and mediastinal contours post median sternotomy. Grossly unchanged right basilar heterogeneous airspace opacity. Unchanged bibasilar  heterogeneous opacities favored to represent atelectasis. Trace pleural effusions are not excluded. No pneumothorax. No definite evidence of edema. No acute osseous abnormalities. Degenerative change the right glenohumeral joint is suspected though incompletely evaluated. IMPRESSION: 1. Unchanged right basilar heterogeneous airspace opacity worrisome for infection and/or aspiration. 2. Similar appearing bibasilar atelectasis and suspected trace bilateral effusions without evidence of edema. Electronically Signed   By: Sandi Mariscal M.D.   On: 12/25/2020 07:50       Suzette Flagler T[address] [place]  If 7PM-7AM, please contact night-coverage [email or web address] 12/25/2020, 1:53 PM

## 2020-12-25 NOTE — Evaluation (Signed)
Occupational Therapy Evaluation Patient Details Name: Lawrence Warner MRN: [id number] DOB: May 12, 1918 Today's Date: 12/25/2020   History of Present Illness Pt is a 87 y.o. male admitted 12/21/20 with SOB, cough, fatigue; workup for acute on chronic hypoxic respiratory failure secondary to COPD exacerbation vs COVID-19 vs possible CAP. Pt had (+) COVID test at home on 10/23. PMH includes CHF, COPD (wears 2-3L O2 baseline), afib, HTN, CAD (s/p CABG), DM, skin CA, anxiety, arthritis.   Clinical Impression   This 87 yo male admitted with above presents to acute OT with PLOF of being able to ambulate in his room with RW and longer distances in scooter, as well as being able to toilet and bath/dress self. Currently he is min A-total A for basic ADLs and Mod -Min A for limited mobility at RW level. He will continue to benefit from acute OT with follow up at SNF.      Recommendations for follow up therapy are one component of a multi-disciplinary discharge planning process, led by the attending physician.  Recommendations may be updated based on patient status, additional functional criteria and insurance authorization.   Follow Up Recommendations  Skilled nursing-short term rehab (<3 hours/day)    Assistance Recommended at Discharge Frequent or constant Supervision/Assistance  Functional Status Assessment  Patient has had a recent decline in their functional status and demonstrates the ability to make significant improvements in function in a reasonable and predictable amount of time.  Equipment Recommendations  None recommended by OT       Precautions / Restrictions Precautions Precautions: Fall Precaution Comments: Watch SpO2 (on [place]) and RR Restrictions Weight Bearing Restrictions: No      Mobility Bed Mobility Overal bed mobility: Needs Assistance Bed Mobility: Supine to Sit;Sit to Sidelying     Supine to sit: Mod assist;HOB elevated (legs and trunk)   Sit to sidelying: Min  guard (legs)      Transfers Overall transfer level: Needs assistance Equipment used: Rolling walker (2 wheels) Transfers: Sit to/from Stand Sit to Stand: Min assist;From elevated surface                  Balance Overall balance assessment: Needs assistance Sitting-balance support: No upper extremity supported;Feet supported Sitting balance-Leahy Scale: Fair     Standing balance support: Bilateral upper extremity supported Standing balance-Leahy Scale: Poor                             ADL either performed or assessed with clinical judgement   ADL Overall ADL's : Needs assistance/impaired Eating/Feeding: Maximal assistance;Bed level Eating/Feeding Details (indicate cue type and reason): because of weakness Grooming: Wash/dry face;Minimal assistance;Sitting Grooming Details (indicate cue type and reason): EOB Upper Body Bathing: Maximal assistance;Sitting Upper Body Bathing Details (indicate cue type and reason): EOB Lower Body Bathing: Total assistance Lower Body Bathing Details (indicate cue type and reason): min A sit<>stand rasied bed Upper Body Dressing : Maximal assistance;Sitting Upper Body Dressing Details (indicate cue type and reason): EOB Lower Body Dressing: Total assistance Lower Body Dressing Details (indicate cue type and reason): min A sit<>stand raised bed Toilet Transfer: Minimal assistance;Rolling walker (2 wheels) Toilet Transfer Details (indicate cue type and reason): simulated sit<>stand raised bed, side step up towards [place] and Hygiene: Total assistance Toileting - Clothing Manipulation Details (indicate cue type and reason): Min A sit<>stand raised bed             Vision Baseline Vision/History:  1 Wears glasses Ability to See in Adequate Light: 0 Adequate Patient Visual Report: No change from baseline              Pertinent Vitals/Pain Pain Assessment: No/denies pain     Hand Dominance  Right   Extremity/Trunk Assessment Upper Extremity Assessment Upper Extremity Assessment: Generalized weakness           Communication Communication Communication: HOH   Cognition Arousal/Alertness: Awake/alert Behavior During Therapy: WFL for tasks assessed/performed Overall Cognitive Status: Within Functional Limits for tasks assessed                                       General Comments  HR 56, SpO2 on 2 liters 90-94%, RR as high as 40 (baseline 19)            Home Living Family/patient expects to be discharged to:: Skilled nursing facility                             Home Equipment: Conservation officer, nature (2 wheels);Transport planner   Additional Comments: Resident at [employer]. Wears 2-3L O2 baseline      Prior Functioning/Environment Prior Level of Function : Needs assist       Physical Assist : Mobility (physical);ADLs (physical)     Mobility Comments: Ambulates short distances with walker, typically uses electric scooter for mobility; will scooter to dining hall for meals ADLs Comments: Aide comes daily to assist with medications, can typically bath and dress himself        OT Problem List: Decreased strength;Impaired balance (sitting and/or standing);Cardiopulmonary status limiting activity      OT Treatment/Interventions: Self-care/ADL training;DME and/or AE instruction;Patient/family education;Balance training;Energy conservation    OT Goals(Current goals can be found in the care plan section) Acute Rehab OT Goals Patient Stated Goal: to get wet bed changed OT Goal Formulation: With patient Time For Goal Achievement: 01/08/21 Potential to Achieve Goals: Good  OT Frequency: Min 2X/week              AM-PAC OT "6 Clicks" Daily Activity     Outcome Measure Help from another person eating meals?: A Lot Help from another person taking care of personal grooming?: A Lot Help from another person toileting, which  includes using toliet, bedpan, or urinal?: A Lot Help from another person bathing (including washing, rinsing, drying)?: A Lot Help from another person to put on and taking off regular upper body clothing?: A Lot Help from another person to put on and taking off regular lower body clothing?: A Lot 6 Click Score: 12   End of Session Equipment Utilized During Treatment: Gait belt;Rolling walker (2 wheels);Oxygen (2 liters) Nurse Communication: Mobility status (bed and pt cleaned up from condom cath off, condom cath not replaced)  Activity Tolerance: Patient tolerated treatment well Patient left: in bed;with call bell/phone within reach;with bed alarm set  OT Visit Diagnosis: Unsteadiness on feet (R26.81);Other abnormalities of gait and mobility (R26.89);Muscle weakness (generalized) (M62.81)                Time: 9937-1696 OT Time Calculation (min): 46 min Charges:  OT General Charges $OT Visit: 1 Visit OT Evaluation $OT Eval Moderate Complexity: 1 Mod OT Treatments $Self Care/Home Management : 23-37 mins  [address], OTR/L Acute [employer] Pager [phone] Office [phone]    Rolm Baptise  [place] 12/25/2020, 5:53 PM

## 2020-12-26 LAB — CULTURE, BLOOD (ROUTINE X 2)
Culture: NO GROWTH
Culture: NO GROWTH
Special Requests: ADEQUATE
Special Requests: ADEQUATE

## 2020-12-26 LAB — RENAL FUNCTION PANEL
Albumin: 2.4 g/dL — ABNORMAL LOW (ref 3.5–5.0)
Anion gap: 11 (ref 5–15)
BUN: 75 mg/dL — ABNORMAL HIGH (ref 8–23)
CO2: 17 mmol/L — ABNORMAL LOW (ref 22–32)
Calcium: 9.5 mg/dL (ref 8.9–10.3)
Chloride: 114 mmol/L — ABNORMAL HIGH (ref 98–111)
Creatinine, Ser: 1.71 mg/dL — ABNORMAL HIGH (ref 0.61–1.24)
GFR, Estimated: 35 mL/min — ABNORMAL LOW (ref >60)
Glucose, Bld: 177 mg/dL — ABNORMAL HIGH (ref 70–99)
Phosphorus: 3 mg/dL (ref 2.5–4.6)
Potassium: 5.3 mmol/L — ABNORMAL HIGH (ref 3.5–5.1)
Sodium: 142 mmol/L (ref 135–145)

## 2020-12-26 LAB — MAGNESIUM: Magnesium: 2 mg/dL (ref 1.7–2.4)

## 2020-12-26 LAB — GLUCOSE, CAPILLARY
Glucose-Capillary: 174 mg/dL — ABNORMAL HIGH (ref 70–99)
Glucose-Capillary: 200 mg/dL — ABNORMAL HIGH (ref 70–99)
Glucose-Capillary: 147 mg/dL — ABNORMAL HIGH (ref 70–99)
Glucose-Capillary: 117 mg/dL — ABNORMAL HIGH (ref 70–99)

## 2020-12-26 LAB — BRAIN NATRIURETIC PEPTIDE: B Natriuretic Peptide: 1047.8 pg/mL — ABNORMAL HIGH (ref 0.0–100.0)

## 2020-12-26 MED ORDER — METHYLPREDNISOLONE SODIUM SUCC 40 MG IJ SOLR
40.0000 mg | INTRAMUSCULAR | Status: AC
Start: 2020-12-26 — End: 2020-12-27
  Administered 2020-12-26 – 2020-12-27 (×2): 40 mg via INTRAVENOUS
  Filled 2020-12-26 (×2): qty 1

## 2020-12-26 MED ORDER — ALBUTEROL SULFATE HFA 108 (90 BASE) MCG/ACT IN AERS
2.0000 | INHALATION_SPRAY | Freq: Two times a day (BID) | RESPIRATORY_TRACT | Status: DC
  Administered 2020-12-26 – 2020-12-28 (×4): 2 via RESPIRATORY_TRACT
  Filled 2020-12-26: qty 6.7

## 2020-12-26 MED ORDER — SODIUM ZIRCONIUM CYCLOSILICATE 10 G PO PACK
10.0000 g | PACK | Freq: Once | ORAL | Status: AC
  Administered 2020-12-26: 10 g via ORAL
  Filled 2020-12-26: qty 1

## 2020-12-26 MED ORDER — METHYLPREDNISOLONE SODIUM SUCC 40 MG IJ SOLR: 40.0000 mg | INTRAMUSCULAR | Status: DC

## 2020-12-26 NOTE — Plan of Care (Signed)

## 2020-12-26 NOTE — Progress Notes (Addendum)
PROGRESS NOTE  Lawrence Warner QQV:[id number] DOB: 1918-07-22   PCP: Wenda Low, MD  Patient is from: [facility].  Lately wheelchair-bound and not using walker.  DOA: 12/21/2020 LOS: 5  Chief complaints:  Chief Complaint  Patient presents with   Shortness of Breath     Brief Narrative / Interim history: 87 year old M with PMH of COPD/chronic hypoxic RF on 2 to 3 L, diastolic CHF, CAD/CABG, DM-2, ITP, CKD-3B, HTN and debility followed by palliative care at home presenting with shortness of breath, productive cough and fatigue for 10 days, and admitted with acute on chronic respiratory failure with hypoxia felt to be due to COPD exacerbation in the setting of COVID-19 infection and possible community-acquired pneumonia.  COVID-19 test positive.  CXR with patchy airspace disease in the right lung.  Troponin 58>>>53.  Patient was a started on systemic steroids, cefepime and azithromycin.    His procalcitonin was within normal.  Cefepime discontinued.  However, patient had worsening respiratory status likely from aspiration and IV fluid.  IV fluid discontinued.  Started on IV Lasix and IV ceftriaxone with improvement in his breathing.  SLP recommended regular diet.  Patient to complete steroid course and antibiotic course on 11/2.  IV Lasix on hold due to worsening renal function.  Likely SNF with palliative follow-up in the next 24 to 48 hours.    Subjective: Seen and examined earlier this morning.  No major events overnight of this morning.  Eating his breakfast with the help of nurse tech at bedside.  He seems to be coughing and clearing his throat every time he drinks.  Reports improvement in his breathing.  He denies chest pain.  Denies GI or UTI symptoms.  Objective: Vitals:   12/25/20 0846 12/25/20 2110 12/26/20 0400 12/26/20 0825  BP: (!) 156/53 (!) 145/68 138/74   Pulse: 67  (!) 50   Resp: (!) 24 20 19    Temp: (!) 97.5 F (36.4 C) 97.6 F (36.4 C) 97.7 F (36.5 C) 97.7 F (36.5  C)  TempSrc: Oral Oral Oral Oral  SpO2: 97% 97% 97%   Weight:   76.8 kg   Height:        Intake/Output Summary (Last 24 hours) at 12/26/2020 1527 Last data filed at 12/26/2020 1252 Gross per 24 hour  Intake 1207 ml  Output 975 ml  Net 232 ml   Filed Weights   12/24/20 0300 12/25/20 0600 12/26/20 0400  Weight: 72.6 kg 76.6 kg 76.8 kg    Examination:  GENERAL: Frail looking elderly male.  Looks very deconditioned. HEENT: MMM.  Vision grossly intact.  Hard of hearing. NECK: Supple.  No apparent JVD.  RESP: 97% on 2 L.  No IWOB.  Rhonchi bilaterally. CVS:  RRR. Heart sounds normal.  ABD/GI/GU: BS+. Abd soft, NTND.  MSK/EXT:  Moves extremities.  Significant muscle mass and subcu fat loss. SKIN: no apparent skin lesion or wound NEURO: Awake and alert. Oriented appropriately.  No apparent focal neuro deficit. PSYCH: Calm. Normal affect.   Procedures:  None  Microbiology summarized: COVID-19 PCR positive. For respiratory viral panel negative. Blood culture NGTD.  Assessment & Plan: COPD exacerbation/chronic hypoxic respiratory failure-on 2 to 3 L at baseline.  Likely due to COVID-19 -Completing course of steroid, CTX and doxycycline on 11/2.   -Continue nebs, IS, OOB, PT/OT -Continue low-dose morphine for anxiety related to air hunger -Wean oxygen as able   COVID-19 virus infection:  Symptomatic for 2 weeks POA.  Tested positive at home on 12/17/2020.  COVID-19 PCR positive.  CXR as above.  CRP normalized.  D-dimer slightly elevated.  Pro-Cal negative. -Continue steroid and breathing treatment as above.  Acute on chronic diastolic CHF: TTE in 02/6107 with LVEF of 50 to 55%, impaired relaxation.  Started on IV Lasix with improvement in his breathing.  I&O incomplete.  Appears euvolemic but BNP 313 (b/l)> >>958 >> 1000 concerning for poor prognosis. Cr slightly up today.  BUN up trended as well. -Hold diuretics today -Monitor fluid status, renal functions and  electrolytes -Monitor renal functions and electrolytes  Markedly elevated D-dimer-reported to be > 20 on 10/29.  Likely erroneous lab.  2.10> 1.6>> 20> 1.5.  LE [place] negative for DVT. -Discontinued IV heparin. -On subcu heparin for VTE prophylaxis   Sepsis ruled out.   Elevated troponin/history of CAD/CABG: 53 >67>> 68.  Likely demand ischemia and and possible myocarditis from COVID-19 infection.  Denies chest pain. -No further cardiac work-up indicated.   AKI/azotemia superimposed on CKD-3B: Initially felt to be prerenal but improved with IV Lasix. Recent Labs    08/09/20 0455 12/21/20 1456 12/22/20 0039 February 14, 2021 0417 12/24/20 0952 12/25/20 0339 12/26/20 0354  BUN 47* 60* 59* 59* 55* 61* 75*  CREATININE 1.62* 2.38* 2.07* 1.74* 1.63* 1.50* 1.71*  -Stop IV Lasix. -Continue holding losartan -Recheck renal function in the morning   New onset A. Fib with bradycardia: HR in 50s at times. -Avoid nodal blocking agents.   Essential hypertension: Normotensive. -Continue amlodipine  -Continue holding losartan   Diet-controlled diabetes mellitus: A1c 6.3%.  Hyperglycemia likely due to steroid Recent Labs  Lab 12/25/20 1208 12/25/20 1733 12/25/20 2111 12/26/20 0554 12/26/20 1149  GLUCAP 190* 147* 140* 174* 200*  -Will be of steroid on 11/2 -Continue SSI-moderate -Further adjustment as appropriate.  Hyperkalemia: mild -Lokelma 10 g x 1.  Metabolic acidosis-due to renal failure? -Continue sodium bicarbonate 650 mg 3 times daily   Idiopathic thrombocytopenic purpura: Resolved. Recent Labs  Lab 12/21/20 1456 12/22/20 0039 February 14, 2021 0417 12/24/20 0952 12/25/20 0339  PLT 169 151 132* 170 169  -Continue monitoring  Skin cancer on face -picks at this when nervous, care order placed to make nurses aware   Anxiety/Panic attack:  -Continue Atarax and as needed morphine  Ambulatory dysfunction/debility: Likely wheelchair-bound and not using walker. -Therapy recommended  SNF   Goal of care counseling-continue medical management as above.  Emphasis on comfort if further decline -Appreciate help by palliative medicine as well -Transfer to SNF with palliative follow-up if stable or emphasis on comfort if he declines.  Decreased oral intake Body mass index is 26.52 kg/m. Nutrition Problem: Increased nutrient needs Etiology: acute illness (COVID-19 infection)-Dietitian consulted. Signs/Symptoms: estimated needs Interventions: Ensure Enlive (each supplement provides 350kcal and 20 grams of protein), MVI   DVT prophylaxis:  heparin injection 5,000 Units Start: 12/24/20 1545 On IV heparin.  Code Status: DNR/DNI Family Communication: Updated patient's daughter-in-law, [name] Level of care: Telemetry Medical Status is: Inpatient  Remains inpatient appropriate because: Hyperkalemia and IV medications  Final disposition: SNF with palliative follow-up if stable or improves.  Otherwise, family on board with emphasis on comfort care if he continues to decline.   Consultants:  Palliative medicine   Sch Meds:  Scheduled Meds:  albuterol  2 puff Inhalation BID   amLODipine  10 mg Oral Daily   vitamin C  500 mg Oral Daily   aspirin EC  81 mg Oral Once per day on Sun Wed   cycloSPORINE  1 drop Both Eyes Q12H  doxycycline  100 mg Oral Q12H   feeding supplement  237 mL Oral TID BM   heparin injection (subcutaneous)  5,000 Units Subcutaneous Q8H   hydrOXYzine  25 mg Oral QHS   insulin aspart  0-15 Units Subcutaneous TID WC   insulin aspart  0-5 Units Subcutaneous QHS   methylPREDNISolone (SOLU-MEDROL) injection  40 mg Intravenous Q24H   multivitamin with minerals  1 tablet Oral Daily   sodium bicarbonate  650 mg Oral TID   zinc sulfate  220 mg Oral Daily   Continuous Infusions:  cefTRIAXone (ROCEPHIN)  IV 1 g (12/25/20 1356)   PRN Meds:.acetaminophen, guaiFENesin-dextromethorphan, hydrALAZINE, ipratropium-albuterol, morphine  injection  Antimicrobials: Anti-infectives (From admission, onward)    Start     Dose/Rate Route Frequency Ordered Stop   12/25/20 2200  doxycycline (VIBRA-TABS) tablet 100 mg        100 mg Oral Every 12 hours 12/25/20 0828     14-Oct-2020 1530  cefTRIAXone (ROCEPHIN) 1 g in sodium chloride 0.9 % 100 mL IVPB        1 g 200 mL/hr over 30 Minutes Intravenous Every 24 hours 14-Oct-2020 1435     12/22/20 1700  ceFEPIme (MAXIPIME) 2 g in sodium chloride 0.9 % 100 mL IVPB  Status:  Discontinued        2 g 200 mL/hr over 30 Minutes Intravenous Every 24 hours 12/21/20 1646 12/21/20 2023   12/22/20 1700  cefTRIAXone (ROCEPHIN) 1 g in sodium chloride 0.9 % 100 mL IVPB  Status:  Discontinued       Note to Pharmacy: Hives appears to be allergy to PCN. Has had keflex in past, please see if had any other cephalosporins.   1 g 200 mL/hr over 30 Minutes Intravenous Every 24 hours 12/21/20 2025 12/22/20 1258   12/22/20 1000  remdesivir 100 mg in sodium chloride 0.9 % 100 mL IVPB  Status:  Discontinued       See Hyperspace for full Linked Orders Report.   100 mg 200 mL/hr over 30 Minutes Intravenous Daily 12/21/20 1719 12/21/20 1748   12/22/20 1000  azithromycin (ZITHROMAX) 500 mg in sodium chloride 0.9 % 250 mL IVPB  Status:  Discontinued        500 mg 250 mL/hr over 60 Minutes Intravenous Every 24 hours 12/21/20 2025 12/22/20 0848   12/22/20 0945  doxycycline (VIBRAMYCIN) 100 mg in sodium chloride 0.9 % 250 mL IVPB        100 mg 125 mL/hr over 120 Minutes Intravenous Every 12 hours 12/22/20 0848 12/25/20 1040   12/21/20 1730  remdesivir 200 mg in sodium chloride 0.9% 250 mL IVPB  Status:  Discontinued       See Hyperspace for full Linked Orders Report.   200 mg 580 mL/hr over 30 Minutes Intravenous Once 12/21/20 1719 12/21/20 1748   12/21/20 1646  vancomycin variable dose per unstable renal function (pharmacist dosing)  Status:  Discontinued         Does not apply See admin instructions 12/21/20 1646  12/21/20 2023   12/21/20 1630  ceFEPIme (MAXIPIME) 2 g in sodium chloride 0.9 % 100 mL IVPB        2 g 200 mL/hr over 30 Minutes Intravenous  Once 12/21/20 1628 12/21/20 1855   12/21/20 1615  vancomycin (VANCOREADY) IVPB 1500 mg/300 mL        1,500 mg 150 mL/hr over 120 Minutes Intravenous  Once 12/21/20 1611 12/21/20 1932   12/21/20 1615  aztreonam (AZACTAM) 2  g in sodium chloride 0.9 % 100 mL IVPB  Status:  Discontinued        2 g 200 mL/hr over 30 Minutes Intravenous  Once 12/21/20 1611 12/21/20 1622        I have personally reviewed the following labs and images: CBC: Recent Labs  Lab 12/21/20 1456 12/21/20 1515 12/22/20 0039 2020/05/29 0417 12/24/20 0952 12/25/20 0339  WBC 10.8*  --  10.2 12.7* 15.5* 12.1*  NEUTROABS 8.8*  --  8.4*  --   --   --   HGB 12.1* 11.2* 11.2* 10.1* 9.9* 10.4*  HCT 39.5 33.0* 35.7* 33.1* 31.9* 33.2*  MCV 96.6  --  93.9 95.4 95.8 94.3  PLT 169  --  151 132* 170 169   BMP &GFR Recent Labs  Lab 12/22/20 0039 2020/05/29 0417 12/24/20 0952 12/25/20 0339 12/26/20 0354  NA 141 141 144 141 142  K 4.9 5.9* 5.1 5.0 5.3*  CL 113* 115* 115* 112* 114*  CO2 22 17* 22 24 17*  GLUCOSE 117* 162* 202* 181* 177*  BUN 59* 59* 55* 61* 75*  CREATININE 2.07* 1.74* 1.63* 1.50* 1.71*  CALCIUM 8.4* 8.6* 8.9 9.1 9.5  MG 2.3 2.2 2.1 2.0 2.0  PHOS 3.2 3.0 3.2 2.6 3.0   Estimated Creatinine Clearance: 21.5 mL/min (A) (by C-G formula based on SCr of 1.71 mg/dL (H)). Liver & Pancreas: Recent Labs  Lab 12/21/20 1456 12/22/20 0039 2020/05/29 0417 12/24/20 0952 12/25/20 0339 12/26/20 0354  AST 22 16  --   --   --   --   ALT 15 13  --   --   --   --   ALKPHOS 56 44  --   --   --   --   BILITOT 0.7 0.7  --   --   --   --   PROT 7.4 6.7  --   --   --   --   ALBUMIN 3.0* 2.6* 2.5* 2.5* 2.4* 2.4*   No results for input(s): LIPASE, AMYLASE in the last 168 hours. No results for input(s): AMMONIA in the last 168 hours. Diabetic: No results for input(s): HGBA1C in  the last 72 hours.  Recent Labs  Lab 12/25/20 1208 12/25/20 1733 12/25/20 2111 12/26/20 0554 12/26/20 1149  GLUCAP 190* 147* 140* 174* 200*   Cardiac Enzymes: No results for input(s): CKTOTAL, CKMB, CKMBINDEX, TROPONINI in the last 168 hours. No results for input(s): PROBNP in the last 8760 hours. Coagulation Profile: No results for input(s): INR, PROTIME in the last 168 hours. Thyroid Function Tests: No results for input(s): TSH, T4TOTAL, FREET4, T3FREE, THYROIDAB in the last 72 hours. Lipid Profile: No results for input(s): CHOL, HDL, LDLCALC, TRIG, CHOLHDL, LDLDIRECT in the last 72 hours.  Anemia Panel: No results for input(s): VITAMINB12, FOLATE, FERRITIN, TIBC, IRON, RETICCTPCT in the last 72 hours.  Urine analysis:    Component Value Date/Time   COLORURINE YELLOW 11/11/2018 1747   APPEARANCEUR CLEAR 11/11/2018 1747   LABSPEC 1.018 11/11/2018 1747   PHURINE 6.0 11/11/2018 1747   GLUCOSEU NEGATIVE 11/11/2018 1747   HGBUR NEGATIVE 11/11/2018 1747   BILIRUBINUR NEGATIVE 11/11/2018 [place] 11/11/2018 1747   PROTEINUR 100 (A) 11/11/2018 1747   NITRITE NEGATIVE 11/11/2018 1747   LEUKOCYTESUR NEGATIVE 11/11/2018 1747   Sepsis Labs: Invalid input(s): PROCALCITONIN, [place]  Microbiology: Recent Results (from the past 240 hour(s))  Resp Panel by RT-PCR (Flu A&B, Covid) Nasopharyngeal Swab     Status: Abnormal  Collection Time: 12/21/20  2:56 PM   Specimen: Nasopharyngeal Swab; Nasopharyngeal(NP) swabs in vial transport medium  Result Value Ref Range Status   SARS Coronavirus 2 by RT PCR POSITIVE (A) NEGATIVE Final    Comment: RESULT CALLED TO, READ BACK BY AND VERIFIED WITHGareth Eagle RN [id number] 12/21/20 A BROWNING (NOTE) SARS-CoV-2 target nucleic acids are DETECTED.  The SARS-CoV-2 RNA is generally detectable in upper respiratory specimens during the acute phase of infection. Positive results are indicative of the presence of the identified  virus, but do not rule out bacterial infection or co-infection with other pathogens not detected by the test. Clinical correlation with patient history and other diagnostic information is necessary to determine patient infection status. The expected result is Negative.  Fact Sheet for Patients: EntrepreneurPulse.com.au  Fact Sheet for Healthcare Providers: IncredibleEmployment.be  This test is not yet approved or cleared by the [place] FDA and  has been authorized for detection and/or diagnosis of SARS-CoV-2 by FDA under an Emergency Use Authorization (EUA).  This EUA will remain in effect (meaning this test c an be used) for the duration of  the COVID-19 declaration under Section 564(b)(1) of the Act, 21 U.S.C. section 360bbb-3(b)(1), unless the authorization is terminated or revoked sooner.     Influenza A by PCR NEGATIVE NEGATIVE Final   Influenza B by PCR NEGATIVE NEGATIVE Final    Comment: (NOTE) The Xpert Xpress SARS-CoV-2/FLU/RSV plus assay is intended as an aid in the diagnosis of influenza from Nasopharyngeal swab specimens and should not be used as a sole basis for treatment. Nasal washings and aspirates are unacceptable for Xpert Xpress SARS-CoV-2/FLU/RSV testing.  Fact Sheet for Patients: EntrepreneurPulse.com.au  Fact Sheet for Healthcare Providers: IncredibleEmployment.be  This test is not yet approved or cleared by the [place] FDA and has been authorized for detection and/or diagnosis of SARS-CoV-2 by FDA under an Emergency Use Authorization (EUA). This EUA will remain in effect (meaning this test can be used) for the duration of the COVID-19 declaration under Section 564(b)(1) of the Act, 21 U.S.C. section 360bbb-3(b)(1), unless the authorization is terminated or revoked.  Performed at [facility], [place] [address] [place], [place] [address]   Blood Culture (routine x  2)     Status: None   Collection Time: 12/21/20  2:56 PM   Specimen: BLOOD  Result Value Ref Range Status   Specimen Description BLOOD RIGHT ANTECUBITAL  Final   Special Requests   Final    BOTTLES DRAWN AEROBIC AND ANAEROBIC Blood Culture adequate volume   Culture   Final    NO GROWTH 5 DAYS Performed at [facility], [place] [address] [place], [place] [address]    Report Status 12/26/2020 FINAL  Final  Blood Culture (routine x 2)     Status: None   Collection Time: 12/21/20  2:56 PM   Specimen: BLOOD  Result Value Ref Range Status   Specimen Description BLOOD LEFT ANTECUBITAL  Final   Special Requests   Final    BOTTLES DRAWN AEROBIC AND ANAEROBIC Blood Culture adequate volume   Culture   Final    NO GROWTH 5 DAYS Performed at [facility], [place] [address] [place], [place] [address]    Report Status 12/26/2020 FINAL  Final  Respiratory (~20 pathogens) panel by PCR     Status: None   Collection Time: 12/21/20  8:13 PM   Specimen: Nasopharyngeal Swab; Respiratory  Result Value Ref Range Status  Adenovirus NOT DETECTED NOT DETECTED Final   Coronavirus 229E NOT DETECTED NOT DETECTED Final    Comment: (NOTE) The Coronavirus on the Respiratory Panel, DOES NOT test for the novel  Coronavirus (2019 nCoV)    Coronavirus HKU1 NOT DETECTED NOT DETECTED Final   Coronavirus NL63 NOT DETECTED NOT DETECTED Final   Coronavirus OC43 NOT DETECTED NOT DETECTED Final   Metapneumovirus NOT DETECTED NOT DETECTED Final   Rhinovirus / Enterovirus NOT DETECTED NOT DETECTED Final   Influenza A NOT DETECTED NOT DETECTED Final   Influenza B NOT DETECTED NOT DETECTED Final   Parainfluenza Virus 1 NOT DETECTED NOT DETECTED Final   Parainfluenza Virus 2 NOT DETECTED NOT DETECTED Final   Parainfluenza Virus 3 NOT DETECTED NOT DETECTED Final   Parainfluenza Virus 4 NOT DETECTED NOT DETECTED Final   Respiratory Syncytial Virus NOT DETECTED NOT DETECTED Final   Bordetella pertussis NOT  DETECTED NOT DETECTED Final   Bordetella Parapertussis NOT DETECTED NOT DETECTED Final   Chlamydophila pneumoniae NOT DETECTED NOT DETECTED Final   Mycoplasma pneumoniae NOT DETECTED NOT DETECTED Final    Comment: Performed at [facility], [place] [address] [place], [place] [address]    Radiology Studies: No results found.     Kristen Bushway T. Derma  If 7PM-7AM, please contact night-coverage [email or web address] 12/26/2020, 3:27 PM

## 2020-12-26 NOTE — Progress Notes (Signed)
Physical Therapy Treatment Patient Details Name: Lawrence Warner MRN: [id number] DOB: [birth year] Today's Date: 12/26/2020   History of Present Illness Pt is a 87 y.o. male admitted 12/21/20 with SOB, cough, fatigue; workup for acute on chronic hypoxic respiratory failure secondary to COPD exacerbation vs COVID-19 vs possible CAP. Pt had (+) COVID test at home on 10/23. PMH includes CHF, COPD (wears 2-3L O2 baseline), afib, HTN, CAD (s/p CABG), DM, skin CA, anxiety, arthritis.    PT Comments    Pt was seen for attempt to stand but could not tolerate this level of activity per pt report.  Pt agreed to try bed exercises, and with active assisted movement went through ROM at all joints.  However, as pt was assisted he began to have very low HR values, dropping to 35 from 60 initially.  Ended session with pt LE's positioned for comfort, and left bed in lowest posture with nursing notified that the catheter had come off prior to PT arrival, and that pt required it to be replaced.     Recommendations for follow up therapy are one component of a multi-disciplinary discharge planning process, led by the attending physician.  Recommendations may be updated based on patient status, additional functional criteria and insurance authorization.  Follow Up Recommendations  Skilled nursing-short term rehab (<3 hours/day)     Assistance Recommended at Discharge Frequent or constant Supervision/Assistance  Equipment Recommendations  None recommended by PT    Recommendations for Other Services       Precautions / Restrictions Precautions Precautions: Fall Precaution Comments: Watch SpO2 (on [place]) and RR Restrictions Weight Bearing Restrictions: No     Mobility  Bed Mobility Overal bed mobility: Needs Assistance Bed Mobility: Rolling Rolling: Mod assist         General bed mobility comments: total assist to scoot up in bed    Transfers                   General transfer comment:  declined to try    Ambulation/Gait                 Stairs             Wheelchair Mobility    Modified Rankin (Stroke Patients Only)       Balance                                            Cognition Arousal/Alertness: Awake/alert Behavior During Therapy: Anxious Overall Cognitive Status: Within Functional Limits for tasks assessed                                 General Comments: answering appropriately but very limited to move and tolerate activity, very bradycardic        Exercises General Exercises - Lower Extremity Ankle Circles/Pumps: AAROM;5 reps Heel Slides: AAROM;10 reps Hip ABduction/ADduction: AAROM;10 reps Straight Leg Raises: AAROM;10 reps    General Comments General comments (skin integrity, edema, etc.): Pt is feeling weak, but pulses range from 35 to 60, initially higher then were having drops      Pertinent Vitals/Pain Pain Assessment: No/denies pain    Home Living                          Prior  Function            PT Goals (current goals can now be found in the care plan section) Acute Rehab PT Goals Patient Stated Goal: move minimally    Frequency    Min 2X/week      PT Plan Current plan remains appropriate    Co-evaluation              AM-PAC PT "6 Clicks" Mobility   Outcome Measure  Help needed turning from your back to your side while in a flat bed without using bedrails?: A Lot Help needed moving from lying on your back to sitting on the side of a flat bed without using bedrails?: A Lot Help needed moving to and from a bed to a chair (including a wheelchair)?: Total Help needed standing up from a chair using your arms (e.g., wheelchair or bedside chair)?: Total Help needed to walk in hospital room?: Total Help needed climbing 3-5 steps with a railing? : Total 6 Click Score: 8    End of Session Equipment Utilized During Treatment: Oxygen Activity Tolerance:  Patient limited by fatigue;Treatment limited secondary to medical complications (Comment) Patient left: in bed;with call bell/phone within reach;with bed alarm set;with family/visitor present Nurse Communication: Mobility status PT Visit Diagnosis: Other abnormalities of gait and mobility (R26.89);Muscle weakness (generalized) (M62.81);Difficulty in walking, not elsewhere classified (R26.2)     Time: 8115-7262 PT Time Calculation (min) (ACUTE ONLY): 10 min  Charges:  $Therapeutic Exercise: 8-22 mins                   Ramond Dial 12/26/2020, 5:03 PM  Mee Hives, PT PhD Acute Rehab Dept. Number: [place] and [place]

## 2020-12-27 ENCOUNTER — Inpatient Hospital Stay (HOSPITAL_COMMUNITY): Payer: Medicare Other

## 2020-12-27 LAB — COMPREHENSIVE METABOLIC PANEL
ALT: 77 U/L — ABNORMAL HIGH (ref 0–44)
AST: 50 U/L — ABNORMAL HIGH (ref 15–41)
Albumin: 2.4 g/dL — ABNORMAL LOW (ref 3.5–5.0)
Alkaline Phosphatase: 53 U/L (ref 38–126)
Anion gap: 7 (ref 5–15)
BUN: 74 mg/dL — ABNORMAL HIGH (ref 8–23)
CO2: 26 mmol/L (ref 22–32)
Calcium: 9.3 mg/dL (ref 8.9–10.3)
Chloride: 111 mmol/L (ref 98–111)
Creatinine, Ser: 1.52 mg/dL — ABNORMAL HIGH (ref 0.61–1.24)
GFR, Estimated: 41 mL/min — ABNORMAL LOW (ref >60)
Glucose, Bld: 188 mg/dL — ABNORMAL HIGH (ref 70–99)
Potassium: 4.9 mmol/L (ref 3.5–5.1)
Sodium: 144 mmol/L (ref 135–145)
Total Bilirubin: 0.7 mg/dL (ref 0.3–1.2)
Total Protein: 5.9 g/dL — ABNORMAL LOW (ref 6.5–8.1)

## 2020-12-27 LAB — MAGNESIUM: Magnesium: 1.9 mg/dL (ref 1.7–2.4)

## 2020-12-27 LAB — GLUCOSE, CAPILLARY
Glucose-Capillary: 158 mg/dL — ABNORMAL HIGH (ref 70–99)
Glucose-Capillary: 180 mg/dL — ABNORMAL HIGH (ref 70–99)
Glucose-Capillary: 145 mg/dL — ABNORMAL HIGH (ref 70–99)
Glucose-Capillary: 114 mg/dL — ABNORMAL HIGH (ref 70–99)

## 2020-12-27 LAB — BRAIN NATRIURETIC PEPTIDE: B Natriuretic Peptide: 607 pg/mL — ABNORMAL HIGH (ref 0.0–100.0)

## 2020-12-27 LAB — PHOSPHORUS: Phosphorus: 3.4 mg/dL (ref 2.5–4.6)

## 2020-12-27 NOTE — Progress Notes (Signed)
Modified Barium Swallow Progress Note  Patient Details  Name: Lawrence Warner MRN: [id number] Date of Birth: 06-Apr-1918  Today's Date: 12/27/2020  Modified Barium Swallow completed.  Full report located under Chart Review in the Imaging Section.  Brief recommendations include the following:  Clinical Impression  Pt demonstrates a severe dysphagia, likely chronic in nature given advanced age and signs of degenerative changes in the cervical spine impacting swallowing. Pt unable to move his neck, states "its frozen" preventing many compensatory strategies and likely also increasing risk of aspiration in suboptimal positioning in hospital bed. Pt has instances of aspiration before and after the swallow with thin, nectar and honey thick liquids given limited epiglottic movement due to decreased hyoid excursion and structural changes of cervical spine. There is mild residue with thin and severe residue with increased viscosity. Pt does have intermittent cough response to aspiration, but cued coughing increases efficacy of airway protection. Pt will not benefit from diet modfication, is recommended to continue regular solids and thin liquids. Cues for increased frequency and force of cough chould be helpful. Risk of aspiration will be ongoing and significant.   Swallow Evaluation Recommendations       SLP Diet Recommendations: Regular solids;Thin liquid   Liquid Administration via: Cup;Straw   Medication Administration: Whole meds with puree   Supervision: Staff to assist with self feeding   Compensations: Hard cough after swallow   Postural Changes: Remain semi-upright after after feeds/meals (Comment);Seated upright at 90 degrees   Oral Care Recommendations: Oral care BID        Roshan Roback, Katherene Ponto 12/27/2020,3:08 PM

## 2020-12-27 NOTE — TOC Progression Note (Signed)
Transition of Care [facility]) - Progression Note    Patient Details  Name: Jaylan Hinojosa MRN: [id number] Date of Birth: [birth year]  Transition of Care [facility]) CM/SW Contact  Reece Agar, [place] Phone Number: 12/27/2020, 1:20 PM  Clinical Narrative:    CSW contacted Claiborne Billings at [facility] to inquire about a Snf bed. Claiborne Billings shared that she does not have any beds available and CSW spoke with pt daughter in-law to discuss Snf options and inform her that there are no beds at [facility]. Pt DIL would like a facility not far from their home and has good ratings.  CSW provided a few options for the family and they would like CSW to contact [facility] for rehab.  CSW contacted Kitty at [facility] to inquire about bed availability, Perrin Smack will have a bed for DC tomorrow. Pt family is agreeable and the pt son and DIL will complete paperwork.   Expected Discharge Plan: [place] Barriers to Discharge: Continued Medical Work up, SNF Covid  Expected Discharge Plan and Services Expected Discharge Plan: [place] In-house Referral: Clinical Social Work   Post Acute Care Choice: [place] Living arrangements for the past 2 months: [place]                                       Social Determinants of Health (SDOH) Interventions    Readmission Risk Interventions Readmission Risk Prevention Plan 11/24/2018  Transportation Screening Complete  Home Care Screening Complete  Medication Review (RN CM) Complete  Some recent data might be hidden

## 2020-12-27 NOTE — Care Management Important Message (Signed)
Important Message  Patient Details  Name: Romond Pipkins MRN: [id number] Date of Birth: 06-27-1918   Medicare Important Message Given:  Yes     Shelda Altes 12/27/2020, 10:10 AM

## 2020-12-27 NOTE — Progress Notes (Signed)
PROGRESS NOTE  Lawrence Warner:[id number] DOB: 1918/09/10   PCP: Wenda Low, MD  Patient is from: ILF.  Lately wheelchair-bound and not using walker.  DOA: 12/21/2020 LOS: 6  Chief complaints:  Chief Complaint  Patient presents with   Shortness of Breath     Brief Narrative / Interim history: 87 year old M with PMH of COPD/chronic hypoxic RF on 2 to 3 L, diastolic CHF, CAD/CABG, DM-2, ITP, CKD-3B, HTN and debility followed by palliative care at home presenting with shortness of breath, productive cough and fatigue for 10 days, and admitted with acute on chronic respiratory failure with hypoxia felt to be due to COPD exacerbation in the setting of COVID-19 infection and possible community-acquired pneumonia.  SNF when able  Subjective: When asked if he needed anything he said "death"  Objective: Vitals:   27-Dec-2020 1600 2020/12/27 1954 December 27, 2020 2323 12/27/20 0421  BP: (!) 175/46 (!) 131/52 (!) 136/58 127/62  Pulse: (!) 33 62 (!) 58 62  Resp: 20 16 16 16   Temp: 98.4 F (36.9 C) 98.6 F (37 C) 97.8 F (36.6 C) 98.1 F (36.7 C)  TempSrc: Oral Axillary Axillary Axillary  SpO2: 99% 98% 99% 98%  Weight:      Height:        Intake/Output Summary (Last 24 hours) at 12/27/2020 1206 Last data filed at 12/27/2020 9390 Gross per 24 hour  Intake 820 ml  Output --  Net 820 ml   Filed Weights   12/24/20 0300 12/25/20 0600 2020/12/27 0400  Weight: 72.6 kg 76.6 kg 76.8 kg    Examination:   General: Appearance:     Overweight male who appears frail     Lungs:      respirations unlabored  Heart:    Normal heart rate.    MS:   All extremities are intact.    Neurologic:   Awake, alert      Procedures:  None  Microbiology summarized: COVID-19 PCR positive.   Assessment & Plan: COPD exacerbation/chronic hypoxic respiratory failure-on 2 to 3 L at baseline.  Likely due to COVID-19 -Completing course of steroid, CTX and doxycycline on 11/2.   -Continue nebs, IS,  OOB, PT/OT -Continue low-dose morphine for anxiety related to air hunger -Wean oxygen as able   COVID-19 virus infection:  Symptomatic for 2 weeks POA.  Tested positive at home on 12/17/2020.  COVID-19 PCR positive.  CXR as above.  CRP normalized.  D-dimer slightly elevated.  Pro-Cal negative. -Continue steroid and breathing treatment as above.  Acute on chronic diastolic CHF: TTE in 04/90 with LVEF of 50 to 55%, impaired relaxation.  Started on IV Lasix with improvement in his breathing.  I&O incomplete.  Appears euvolemic but BNP 313 (b/l)> >>958 >> 1000 concerning for poor prognosis. Cr slightly up today.  BUN up trended as well. -Hold diuretics today -Monitor fluid status, renal functions and electrolytes -Monitor renal functions and electrolytes  Markedly elevated D-dimer-reported to be > 20 on 10/29.  Likely erroneous lab.  2.10> 1.6>> 20> 1.5.  LE [place] negative for DVT. -Discontinued IV heparin. -On subcu heparin for VTE prophylaxis   Sepsis ruled out.   Elevated troponin/history of CAD/CABG: 53 >67>> 68.  Likely demand ischemia and and possible myocarditis from COVID-19 infection.  Denies chest pain. -No further cardiac work-up indicated.   AKI/azotemia superimposed on CKD-3B:  -Stop IV Lasix. -Continue holding losartan  New onset A. Fib with bradycardia: HR in 50s at times. -Avoid nodal blocking agents.   Essential hypertension:  Normotensive. -Continue amlodipine  -Continue holding losartan   Diet-controlled diabetes mellitus: A1c 6.3%.  Hyperglycemia likely due to steroid -Will be of steroid on 11/2 -Continue SSI-moderate -Further adjustment as appropriate.  Hyperkalemia: mild -Lokelma 10 g x 1.  Metabolic acidosis-due to renal failure? -Continue sodium bicarbonate 650 mg 3 times daily   Idiopathic thrombocytopenic purpura: Resolved. Recent Labs  Lab 12/21/20 1456 12/22/20 0039 02/14/2021 0417 12/24/20 0952 12/25/20 0339  PLT 169 151 132* 170 169  -Continue  monitoring  Skin cancer on face -outpatient follow up  Anxiety/Panic attack:  -Continue Atarax and as needed morphine  Ambulatory dysfunction/debility: Likely wheelchair-bound and not using walker. -Therapy recommended SNF   Goal of care counseling-continue medical management as above.  Emphasis on comfort if further decline -Appreciate help by palliative medicine as well -Transfer to SNF with palliative follow-up if stable or emphasis on comfort if he declines.  Decreased oral intake Body mass index is 26.52 kg/m. Nutrition Problem: Increased nutrient needs Etiology: acute illness (COVID-19 infection)-Dietitian consulted. Signs/Symptoms: estimated needs Interventions: Ensure Enlive (each supplement provides 350kcal and 20 grams of protein), MVI   DVT prophylaxis:  heparin injection 5,000 Units Start: 12/24/20 1545 On IV heparin.  Code Status: DNR/DNI Family Communication: Updated patient's family at bedside Level of care: Telemetry Medical Status is: Inpatient  Remains inpatient appropriate because: needs SNF placement  Final disposition: SNF with palliative follow-up if stable or improves.  Otherwise, family on board with emphasis on comfort care if he continues to decline.   Consultants:  Palliative medicine   Sch Meds:  Scheduled Meds:  albuterol  2 puff Inhalation BID   amLODipine  10 mg Oral Daily   vitamin C  500 mg Oral Daily   aspirin EC  81 mg Oral Once per day on Sun Wed   cycloSPORINE  1 drop Both Eyes Q12H   doxycycline  100 mg Oral Q12H   feeding supplement  237 mL Oral TID BM   heparin injection (subcutaneous)  5,000 Units Subcutaneous Q8H   hydrOXYzine  25 mg Oral QHS   insulin aspart  0-15 Units Subcutaneous TID WC   insulin aspart  0-5 Units Subcutaneous QHS   methylPREDNISolone (SOLU-MEDROL) injection  40 mg Intravenous Q24H   multivitamin with minerals  1 tablet Oral Daily   sodium bicarbonate  650 mg Oral TID   zinc sulfate  220 mg Oral  Daily   Continuous Infusions:  cefTRIAXone (ROCEPHIN)  IV 1 g (12/26/20 1657)   PRN Meds:.acetaminophen, guaiFENesin-dextromethorphan, hydrALAZINE, ipratropium-albuterol, morphine injection  Antimicrobials: Anti-infectives (From admission, onward)    Start     Dose/Rate Route Frequency Ordered Stop   12/25/20 2200  doxycycline (VIBRA-TABS) tablet 100 mg        100 mg Oral Every 12 hours 12/25/20 0828     02/14/2021 1530  cefTRIAXone (ROCEPHIN) 1 g in sodium chloride 0.9 % 100 mL IVPB        1 g 200 mL/hr over 30 Minutes Intravenous Every 24 hours 02/14/2021 1435     12/22/20 1700  ceFEPIme (MAXIPIME) 2 g in sodium chloride 0.9 % 100 mL IVPB  Status:  Discontinued        2 g 200 mL/hr over 30 Minutes Intravenous Every 24 hours 12/21/20 1646 12/21/20 2023   12/22/20 1700  cefTRIAXone (ROCEPHIN) 1 g in sodium chloride 0.9 % 100 mL IVPB  Status:  Discontinued       Note to Pharmacy: Hives appears to be allergy to PCN.  Has had keflex in past, please see if had any other cephalosporins.   1 g 200 mL/hr over 30 Minutes Intravenous Every 24 hours 12/21/20 2025 12/22/20 1258   12/22/20 1000  remdesivir 100 mg in sodium chloride 0.9 % 100 mL IVPB  Status:  Discontinued       See Hyperspace for full Linked Orders Report.   100 mg 200 mL/hr over 30 Minutes Intravenous Daily 12/21/20 1719 12/21/20 1748   12/22/20 1000  azithromycin (ZITHROMAX) 500 mg in sodium chloride 0.9 % 250 mL IVPB  Status:  Discontinued        500 mg 250 mL/hr over 60 Minutes Intravenous Every 24 hours 12/21/20 2025 12/22/20 0848   12/22/20 0945  doxycycline (VIBRAMYCIN) 100 mg in sodium chloride 0.9 % 250 mL IVPB        100 mg 125 mL/hr over 120 Minutes Intravenous Every 12 hours 12/22/20 0848 12/25/20 1040   12/21/20 1730  remdesivir 200 mg in sodium chloride 0.9% 250 mL IVPB  Status:  Discontinued       See Hyperspace for full Linked Orders Report.   200 mg 580 mL/hr over 30 Minutes Intravenous Once 12/21/20 1719 12/21/20  1748   12/21/20 1646  vancomycin variable dose per unstable renal function (pharmacist dosing)  Status:  Discontinued         Does not apply See admin instructions 12/21/20 1646 12/21/20 2023   12/21/20 1630  ceFEPIme (MAXIPIME) 2 g in sodium chloride 0.9 % 100 mL IVPB        2 g 200 mL/hr over 30 Minutes Intravenous  Once 12/21/20 1628 12/21/20 1855   12/21/20 1615  vancomycin (VANCOREADY) IVPB 1500 mg/300 mL        1,500 mg 150 mL/hr over 120 Minutes Intravenous  Once 12/21/20 1611 12/21/20 1932   12/21/20 1615  aztreonam (AZACTAM) 2 g in sodium chloride 0.9 % 100 mL IVPB  Status:  Discontinued        2 g 200 mL/hr over 30 Minutes Intravenous  Once 12/21/20 1611 12/21/20 1622        I have personally reviewed the following labs and images: CBC: Recent Labs  Lab 12/21/20 1456 12/21/20 1515 12/22/20 0039 08/08/2020 0417 12/24/20 0952 12/25/20 0339  WBC 10.8*  --  10.2 12.7* 15.5* 12.1*  NEUTROABS 8.8*  --  8.4*  --   --   --   HGB 12.1* 11.2* 11.2* 10.1* 9.9* 10.4*  HCT 39.5 33.0* 35.7* 33.1* 31.9* 33.2*  MCV 96.6  --  93.9 95.4 95.8 94.3  PLT 169  --  151 132* 170 169   BMP &GFR Recent Labs  Lab 08/08/2020 0417 12/24/20 0952 12/25/20 0339 12/26/20 0354 12/27/20 0336  NA 141 144 141 142 144  K 5.9* 5.1 5.0 5.3* 4.9  CL 115* 115* 112* 114* 111  CO2 17* 22 24 17* 26  GLUCOSE 162* 202* 181* 177* 188*  BUN 59* 55* 61* 75* 74*  CREATININE 1.74* 1.63* 1.50* 1.71* 1.52*  CALCIUM 8.6* 8.9 9.1 9.5 9.3  MG 2.2 2.1 2.0 2.0 1.9  PHOS 3.0 3.2 2.6 3.0 3.4   Estimated Creatinine Clearance: 24.2 mL/min (A) (by C-G formula based on SCr of 1.52 mg/dL (H)). Liver & Pancreas: Recent Labs  Lab 12/21/20 1456 12/22/20 0039 08/08/2020 0417 12/24/20 0952 12/25/20 0339 12/26/20 0354 12/27/20 0336  AST 22 16  --   --   --   --  50*  ALT 15 13  --   --   --   --  77*  ALKPHOS 56 44  --   --   --   --  53  BILITOT 0.7 0.7  --   --   --   --  0.7  PROT 7.4 6.7  --   --   --   --  5.9*   ALBUMIN 3.0* 2.6* 2.5* 2.5* 2.4* 2.4* 2.4*   No results for input(s): LIPASE, AMYLASE in the last 168 hours. No results for input(s): AMMONIA in the last 168 hours. Diabetic: No results for input(s): HGBA1C in the last 72 hours.  Recent Labs  Lab 12/26/20 0554 12/26/20 1149 12/26/20 1653 12/26/20 2216 12/27/20 0611  GLUCAP 174* 200* 147* 117* 158*   Cardiac Enzymes: No results for input(s): CKTOTAL, CKMB, CKMBINDEX, TROPONINI in the last 168 hours. No results for input(s): PROBNP in the last 8760 hours. Coagulation Profile: No results for input(s): INR, PROTIME in the last 168 hours. Thyroid Function Tests: No results for input(s): TSH, T4TOTAL, FREET4, T3FREE, THYROIDAB in the last 72 hours. Lipid Profile: No results for input(s): CHOL, HDL, LDLCALC, TRIG, CHOLHDL, LDLDIRECT in the last 72 hours.  Anemia Panel: No results for input(s): VITAMINB12, FOLATE, FERRITIN, TIBC, IRON, RETICCTPCT in the last 72 hours.  Urine analysis:    Component Value Date/Time   COLORURINE YELLOW 11/11/2018 1747   APPEARANCEUR CLEAR 11/11/2018 1747   LABSPEC 1.018 11/11/2018 1747   PHURINE 6.0 11/11/2018 1747   GLUCOSEU NEGATIVE 11/11/2018 1747   HGBUR NEGATIVE 11/11/2018 1747   BILIRUBINUR NEGATIVE 11/11/2018 [place] 11/11/2018 1747   PROTEINUR 100 (A) 11/11/2018 1747   NITRITE NEGATIVE 11/11/2018 1747   LEUKOCYTESUR NEGATIVE 11/11/2018 1747   Sepsis Labs: Invalid input(s): PROCALCITONIN, [place]  Microbiology: Recent Results (from the past 240 hour(s))  Resp Panel by RT-PCR (Flu A&B, Covid) Nasopharyngeal Swab     Status: Abnormal   Collection Time: 12/21/20  2:56 PM   Specimen: Nasopharyngeal Swab; Nasopharyngeal(NP) swabs in vial transport medium  Result Value Ref Range Status   SARS Coronavirus 2 by RT PCR POSITIVE (A) NEGATIVE Final    Comment: RESULT CALLED TO, READ BACK BY AND VERIFIED WITHGareth Eagle RN 1761 12/21/20 A [place] (NOTE) SARS-CoV-2  target nucleic acids are DETECTED.  The SARS-CoV-2 RNA is generally detectable in upper respiratory specimens during the acute phase of infection. Positive results are indicative of the presence of the identified virus, but do not rule out bacterial infection or co-infection with other pathogens not detected by the test. Clinical correlation with patient history and other diagnostic information is necessary to determine patient infection status. The expected result is Negative.  Fact Sheet for Patients: EntrepreneurPulse.com.au  Fact Sheet for Healthcare Providers: IncredibleEmployment.be  This test is not yet approved or cleared by the [place] FDA and  has been authorized for detection and/or diagnosis of SARS-CoV-2 by FDA under an Emergency Use Authorization (EUA).  This EUA will remain in effect (meaning this test c an be used) for the duration of  the COVID-19 declaration under Section 564(b)(1) of the Act, 21 U.S.C. section 360bbb-3(b)(1), unless the authorization is terminated or revoked sooner.     Influenza A by PCR NEGATIVE NEGATIVE Final   Influenza B by PCR NEGATIVE NEGATIVE Final    Comment: (NOTE) The Xpert Xpress SARS-CoV-2/FLU/RSV plus assay is intended as an aid in the diagnosis of influenza from Nasopharyngeal swab specimens and should not be used as a sole basis for treatment. Nasal washings and aspirates are unacceptable for Xpert  Xpress SARS-CoV-2/FLU/RSV testing.  Fact Sheet for Patients: EntrepreneurPulse.com.au  Fact Sheet for Healthcare Providers: IncredibleEmployment.be  This test is not yet approved or cleared by the [place] FDA and has been authorized for detection and/or diagnosis of SARS-CoV-2 by FDA under an Emergency Use Authorization (EUA). This EUA will remain in effect (meaning this test can be used) for the duration of the COVID-19 declaration under Section  564(b)(1) of the Act, 21 U.S.C. section 360bbb-3(b)(1), unless the authorization is terminated or revoked.  Performed at [facility], [place] [address] [place], [place] [address]   Blood Culture (routine x 2)     Status: None   Collection Time: 12/21/20  2:56 PM   Specimen: BLOOD  Result Value Ref Range Status   Specimen Description BLOOD RIGHT ANTECUBITAL  Final   Special Requests   Final    BOTTLES DRAWN AEROBIC AND ANAEROBIC Blood Culture adequate volume   Culture   Final    NO GROWTH 5 DAYS Performed at [facility], [place] [address], [place], [place] [address]    Report Status 12/26/2020 FINAL  Final  Blood Culture (routine x 2)     Status: None   Collection Time: 12/21/20  2:56 PM   Specimen: BLOOD  Result Value Ref Range Status   Specimen Description BLOOD LEFT ANTECUBITAL  Final   Special Requests   Final    BOTTLES DRAWN AEROBIC AND ANAEROBIC Blood Culture adequate volume   Culture   Final    NO GROWTH 5 DAYS Performed at [facility], [place] [address], [place], [place] [address]    Report Status 12/26/2020 FINAL  Final  Respiratory (~20 pathogens) panel by PCR     Status: None   Collection Time: 12/21/20  8:13 PM   Specimen: Nasopharyngeal Swab; Respiratory  Result Value Ref Range Status   Adenovirus NOT DETECTED NOT DETECTED Final   Coronavirus 229E NOT DETECTED NOT DETECTED Final    Comment: (NOTE) The Coronavirus on the Respiratory Panel, DOES NOT test for the novel  Coronavirus (2019 nCoV)    Coronavirus HKU1 NOT DETECTED NOT DETECTED Final   Coronavirus NL63 NOT DETECTED NOT DETECTED Final   Coronavirus OC43 NOT DETECTED NOT DETECTED Final   Metapneumovirus NOT DETECTED NOT DETECTED Final   Rhinovirus / Enterovirus NOT DETECTED NOT DETECTED Final   Influenza A NOT DETECTED NOT DETECTED Final   Influenza B NOT DETECTED NOT DETECTED Final   Parainfluenza Virus 1 NOT DETECTED NOT DETECTED Final   Parainfluenza Virus 2 NOT DETECTED NOT DETECTED  Final   Parainfluenza Virus 3 NOT DETECTED NOT DETECTED Final   Parainfluenza Virus 4 NOT DETECTED NOT DETECTED Final   Respiratory Syncytial Virus NOT DETECTED NOT DETECTED Final   Bordetella pertussis NOT DETECTED NOT DETECTED Final   Bordetella Parapertussis NOT DETECTED NOT DETECTED Final   Chlamydophila pneumoniae NOT DETECTED NOT DETECTED Final   Mycoplasma pneumoniae NOT DETECTED NOT DETECTED Final    Comment: Performed at [facility], [place] [address] [place], [place] [address]    Radiology Studies: No results found.     Eulogio Bear [facility]  If 7PM-7AM, please contact night-coverage [email or web address] 12/27/2020, 12:06 PM

## 2020-12-27 NOTE — Evaluation (Signed)
Clinical/Bedside Swallow Evaluation Patient Details  Name: Lawrence Warner MRN: [id number] Date of Birth: 1918-03-15  Today's Date: 12/27/2020 Time: SLP Start Time (ACUTE ONLY): 1100 SLP Stop Time (ACUTE ONLY): 1108 SLP Time Calculation (min) (ACUTE ONLY): 8 min  Past Medical History:  Past Medical History:  Diagnosis Date   Basal cell carcinoma 10/23/2017   LEFT TEMPLE CX3 5FU   BCC (basal cell carcinoma of skin)    BPH (benign prostatic hypertrophy)    Chronic heart failure (HCC)    Chronic renal insufficiency    Coronary artery disease    CABG in 2000   Diabetes type 2, controlled ([place])    Diet-controlled   Diabetic neuropathy (HCC)    Dyslipidemia    Hypertension    ITP (idiopathic thrombocytopenic purpura)    Osteoarthritis    Pneumonia    SCC (squamous cell carcinoma) 10/23/2017   RIGHT SIDEBURN TX=CX3 5FU CAUTERY   SCC (squamous cell carcinoma) 04/23/2018   RIGHT JAWLINE TX=EXC   Squamous cell carcinoma of skin 08/17/2015   LEFT HAND CX3 5FU CAUTERY   Past Surgical History:  Past Surgical History:  Procedure Laterality Date   CHOLECYSTECTOMY     CORONARY ARTERY BYPASS GRAFT  2000   Right hip replacement  2014   TONSILLECTOMY     HPI:  Patient is a 87 y.o. male with PMH: COPD/chronic hypoxic RF on 2 to 3 L, diastolic CHF, CAD/CABG, DM-2, ITP, CKD-3B, HTN and debility followed by palliative care at home presenting with shortness of breath, productive cough and fatigue for 10 days, and admitted with acute on chronic respiratory failure with hypoxia felt to be due to COPD exacerbation in the setting of COVID-19 infection and possible community-acquired pneumonia.  COVID-19 test positive.  CXR with patchy airspace disease in the right lung. He has a productive cough.    Assessment / Plan / Recommendation  Clinical Impression  Pt demonstrates no baseline cough today, but has immediate prolonged cough after sip of water. Mulitple possibly inefficient swallow observed  subjectively. Will f/u with instrumental assessment SLP Visit Diagnosis: Dysphagia, unspecified (R13.10)    Aspiration Risk  Moderate aspiration risk    Diet Recommendation          Other  Recommendations      Recommendations for follow up therapy are one component of a multi-disciplinary discharge planning process, led by the attending physician.  Recommendations may be updated based on patient status, additional functional criteria and insurance authorization.  Follow up Recommendations        Frequency and Duration            Prognosis        Swallow Study   General HPI: Patient is a 87 y.o. male with PMH: COPD/chronic hypoxic RF on 2 to 3 L, diastolic CHF, CAD/CABG, DM-2, ITP, CKD-3B, HTN and debility followed by palliative care at home presenting with shortness of breath, productive cough and fatigue for 10 days, and admitted with acute on chronic respiratory failure with hypoxia felt to be due to COPD exacerbation in the setting of COVID-19 infection and possible community-acquired pneumonia.  COVID-19 test positive.  CXR with patchy airspace disease in the right lung. He has a productive cough. Type of Study: Bedside Swallow Evaluation Diet Prior to this Study: Regular;Thin liquids Temperature Spikes Noted: No Respiratory Status: Nasal cannula History of Recent Intubation: No Behavior/Cognition: Alert;Cooperative;Lethargic/Drowsy Oral Cavity Assessment: Dry Oral Care Completed by SLP: No Oral Cavity - Dentition: Dentures, top;Dentures, bottom Vision: Functional  for self-feeding Self-Feeding Abilities: Able to feed self Patient Positioning: Upright in bed    Oral/Motor/Sensory Function Overall Oral Motor/Sensory Function: Within functional limits   Ice Chips     Thin Liquid Thin Liquid: Impaired Pharyngeal  Phase Impairments: Cough - Immediate    Nectar Thick Nectar Thick Liquid: Not tested   Honey Thick Honey Thick Liquid: Not tested   Puree Puree: Not tested    Solid     Solid: Not tested      Lynann Beaver 12/27/2020,2:06 PM

## 2020-12-28 DIAGNOSIS — J9601 Acute respiratory failure with hypoxia: Secondary | ICD-10-CM

## 2020-12-28 LAB — GLUCOSE, CAPILLARY
Glucose-Capillary: 171 mg/dL — ABNORMAL HIGH (ref 70–99)
Glucose-Capillary: 164 mg/dL — ABNORMAL HIGH (ref 70–99)
Glucose-Capillary: 132 mg/dL — ABNORMAL HIGH (ref 70–99)

## 2020-12-28 MED ORDER — ENSURE ENLIVE PO LIQD: 237.0000 mL | Freq: Three times a day (TID) | ORAL | 12 refills | Status: DC

## 2020-12-28 MED ORDER — IPRATROPIUM-ALBUTEROL 0.5-2.5 (3) MG/3ML IN SOLN: 3.0000 mL | RESPIRATORY_TRACT | Status: AC | PRN

## 2020-12-28 MED ORDER — INSULIN ASPART 100 UNIT/ML IJ SOLN: 0.0000 [IU] | Freq: Three times a day (TID) | INTRAMUSCULAR | 11 refills | Status: DC

## 2020-12-28 MED ORDER — ASCORBIC ACID 500 MG PO TABS: 500.0000 mg | ORAL_TABLET | Freq: Every day | ORAL | Status: AC

## 2020-12-28 MED ORDER — GUAIFENESIN-DM 100-10 MG/5ML PO SYRP: 10.0000 mL | ORAL_SOLUTION | ORAL | 0 refills | Status: AC | PRN

## 2020-12-28 MED ORDER — ZINC SULFATE 220 (50 ZN) MG PO CAPS: 220.0000 mg | ORAL_CAPSULE | Freq: Every day | ORAL | Status: AC

## 2020-12-28 MED ORDER — AMLODIPINE BESYLATE 10 MG PO TABS: 10.0000 mg | ORAL_TABLET | Freq: Every day | ORAL | Status: AC

## 2020-12-28 NOTE — Plan of Care (Signed)
  Problem: Health Behavior/Discharge Planning: ?Goal: Ability to manage health-related needs will improve ?Outcome: Progressing ?  ?Problem: Clinical Measurements: ?Goal: Ability to maintain clinical measurements within normal limits will improve ?Outcome: Progressing ?Goal: Will remain free from infection ?Outcome: Progressing ?Goal: Respiratory complications will improve ?Outcome: Progressing ?Goal: Cardiovascular complication will be avoided ?Outcome: Progressing ?  ?Problem: Coping: ?Goal: Level of anxiety will decrease ?Outcome: Progressing ?  ?

## 2020-12-28 NOTE — Plan of Care (Signed)
  Problem: Education: Goal: Knowledge of General Education information will improve Description: Including pain rating scale, medication(s)/side effects and non-pharmacologic comfort measures Outcome: Adequate for Discharge   Problem: Health Behavior/Discharge Planning: Goal: Ability to manage health-related needs will improve Outcome: Adequate for Discharge   Problem: Clinical Measurements: Goal: Ability to maintain clinical measurements within normal limits will improve Outcome: Adequate for Discharge Goal: Will remain free from infection Outcome: Adequate for Discharge Goal: Diagnostic test results will improve Outcome: Adequate for Discharge Goal: Respiratory complications will improve Outcome: Adequate for Discharge Goal: Cardiovascular complication will be avoided Outcome: Adequate for Discharge   Problem: Activity: Goal: Risk for activity intolerance will decrease Outcome: Adequate for Discharge   Problem: Nutrition: Goal: Adequate nutrition will be maintained Outcome: Adequate for Discharge   Problem: Coping: Goal: Level of anxiety will decrease Outcome: Adequate for Discharge   Problem: Elimination: Goal: Will not experience complications related to bowel motility Outcome: Adequate for Discharge Goal: Will not experience complications related to urinary retention Outcome: Adequate for Discharge   Problem: Pain Managment: Goal: General experience of comfort will improve Outcome: Adequate for Discharge   Problem: Safety: Goal: Ability to remain free from injury will improve Outcome: Adequate for Discharge   Problem: Skin Integrity: Goal: Risk for impaired skin integrity will decrease Outcome: Adequate for Discharge   Problem: Acute Rehab PT Goals(only PT should resolve) Goal: Pt Will Go Supine/Side To Sit Outcome: Adequate for Discharge Goal: Patient Will Transfer Sit To/From Stand Outcome: Adequate for Discharge Goal: Pt Will Ambulate Outcome: Adequate  for Discharge   Problem: Increased Nutrient Needs (NI-5.1) Goal: Food and/or nutrient delivery Description: Individualized approach for food/nutrient provision. Outcome: Adequate for Discharge   Problem: Acute Rehab OT Goals (only OT should resolve) Goal: Pt. Will Perform Grooming Outcome: Adequate for Discharge Goal: Pt. Will Perform Upper Body Bathing Outcome: Adequate for Discharge Goal: Pt. Will Perform Lower Body Bathing Outcome: Adequate for Discharge Goal: Pt. Will Perform Upper Body Dressing Outcome: Adequate for Discharge Goal: Pt. Will Perform Lower Body Dressing Outcome: Adequate for Discharge Goal: Pt. Will Transfer To Toilet Outcome: Adequate for Discharge Goal: Pt. Will Perform Toileting-Clothing Manipulation Outcome: Adequate for Discharge

## 2020-12-28 NOTE — Progress Notes (Signed)
Physical Therapy Treatment Patient Details Name: Lawrence Warner MRN: [id number] DOB: [birth year] Today's Date: 12/28/2020   History of Present Illness Pt is a 87 y.o. male admitted 12/21/20 with SOB, cough, fatigue; workup for acute on chronic hypoxic respiratory failure secondary to COPD exacerbation vs COVID-19 vs possible CAP. Pt had (+) COVID test at home on 10/23. PMH includes CHF, COPD (wears 2-3L O2 baseline), afib, HTN, CAD (s/p CABG), DM, skin CA, anxiety, arthritis.    PT Comments    Pt declining EOB or OOB mobility this date due to fatigue. However, pt agreeable to bed level exercises. Focused on strengthening core and upper and lower extremities, see Exercises below. Pt displayed significant weakness in his core, R shoulder, and hip flexors bil. Educated pt on importance of mobility and sitting upright to improve pulmonary function, increased HOB elevation end of session. Will continue to follow acutely. Current recommendations remain appropriate.   Recommendations for follow up therapy are one component of a multi-disciplinary discharge planning process, led by the attending physician.  Recommendations may be updated based on patient status, additional functional criteria and insurance authorization.  Follow Up Recommendations  Skilled nursing-short term rehab (<3 hours/day)     Assistance Recommended at Discharge Frequent or constant Supervision/Assistance  Equipment Recommendations  None recommended by PT    Recommendations for Other Services       Precautions / Restrictions Precautions Precautions: Fall Precaution Comments: Watch SpO2 (on [place]) and RR Restrictions Weight Bearing Restrictions: No     Mobility  Bed Mobility Overal bed mobility: Needs Assistance Bed Mobility: Rolling Rolling: Supervision   Supine to sit: Mod assist;HOB elevated     General bed mobility comments: Pt rolls either direction partially with supervision and extra time. Supine > long  sitting in bed from elevated HOB with pt pulling on PT's hands, modA, 5x. Pt declined further bed mobility.    Transfers                   General transfer comment: Pt declined to attempt    Ambulation/Gait             General Gait Details: Pt declined to attempt.   Stairs             Wheelchair Mobility    Modified Rankin (Stroke Patients Only)       Balance                                            Cognition Arousal/Alertness: Awake/alert Behavior During Therapy: Flat affect Overall Cognitive Status: Within Functional Limits for tasks assessed                                 General Comments: Pt with flat affect throughout, likely due to reported fatigue. Pt very limited in mobility tolerance, declining EOB or OOB mobility at this time.        Exercises General Exercises - Upper Extremity Shoulder Flexion: AAROM;Both;AROM;10 reps;Supine (AAROM on R) Elbow Flexion: AROM;Both;10 reps;Supine Elbow Extension: AROM;Both;10 reps;Supine General Exercises - Lower Extremity Ankle Circles/Pumps: AROM;Both;10 reps;Supine Quad Sets: AROM;Both;10 reps;Supine Heel Slides: AAROM;Both;10 reps;Supine Hip ABduction/ADduction: AROM;Both;10 reps;Supine Straight Leg Raises: AAROM;Both;10 reps;Supine Other Exercises Other Exercises: Modified sit-ups/pull ups with pt pulling on PT's hands to ascend trunk from elevated HOB, cues to tuck  chin    General Comments General comments (skin integrity, edema, etc.): educated pt on importance of mobility and sitting upright to improve pulmonary function      Pertinent Vitals/Pain Pain Assessment: Faces Faces Pain Scale: Hurts little more Pain Location: generalized with mobility Pain Descriptors / Indicators: Discomfort;Grimacing Pain Intervention(s): Monitored during session;Limited activity within patient's tolerance;Repositioned    Home Living                           Prior Function            PT Goals (current goals can now be found in the care plan section) Acute Rehab PT Goals Patient Stated Goal: to go to rehab today PT Goal Formulation: With patient Time For Goal Achievement: 01/06/21 Potential to Achieve Goals: Fair Progress towards PT goals: Progressing toward goals    Frequency    Min 2X/week      PT Plan Current plan remains appropriate    Co-evaluation              AM-PAC PT "6 Clicks" Mobility   Outcome Measure  Help needed turning from your back to your side while in a flat bed without using bedrails?: A Little Help needed moving from lying on your back to sitting on the side of a flat bed without using bedrails?: A Lot Help needed moving to and from a bed to a chair (including a wheelchair)?: Total Help needed standing up from a chair using your arms (e.g., wheelchair or bedside chair)?: Total Help needed to walk in hospital room?: Total Help needed climbing 3-5 steps with a railing? : Total 6 Click Score: 9    End of Session Equipment Utilized During Treatment: Oxygen Activity Tolerance: Patient limited by fatigue Patient left: in bed;with call bell/phone within reach;with bed alarm set   PT Visit Diagnosis: Other abnormalities of gait and mobility (R26.89);Muscle weakness (generalized) (M62.81);Difficulty in walking, not elsewhere classified (R26.2)     Time: 0092-3300 PT Time Calculation (min) (ACUTE ONLY): 14 min  Charges:  $Therapeutic Exercise: 8-22 mins                     Moishe Spice, PT, DPT Acute Rehabilitation Services  Pager: [phone] Office: [place] 12/28/2020, 5:27 PM

## 2020-12-28 NOTE — TOC Transition Note (Signed)
Transition of Care [facility]) - CM/SW Discharge Note   Patient Details  Name: Lawrence Warner MRN: [id number] Date of Birth: 02-Oct-1918  [facility] CM/SW Contact:  Tresa Endo Phone Number: 12/28/2020, 12:56 PM   Clinical Narrative:    Patient will DC to: [facility] Anticipated DC date: 12/28/2020 Family notified: Pt son/daughter in [employer] by: Corey Harold   Per MD patient ready for DC to [facility]. RN to call report prior to discharge [phone]). RN, patient, patient's family, and facility notified of DC. Discharge Summary and FL2 sent to facility. DC packet on chart. Ambulance transport requested for patient.   CSW will sign off for now as social work intervention is no longer needed. Please consult [place] again if new needs arise.       Barriers to Discharge: Continued Medical Work up, SNF Covid   Patient Goals and CMS Choice Patient states their goals for this hospitalization and ongoing recovery are:: Rehab CMS Medicare.gov Compare Post Acute Care list provided to:: Patient Represenative (must comment) Choice offered to / list presented to : Adult Children (Daughter in Sports coach)  Discharge Placement                       Discharge Plan and Services In-house Referral: Clinical Social Work   Post Acute Care Choice: [place]                               Social Determinants of Health (SDOH) Interventions     Readmission Risk Interventions Readmission Risk Prevention Plan 11/24/2018  Transportation Screening Complete  Home Care Screening Complete  Medication Review (RN CM) Complete  Some recent data might be hidden

## 2020-12-28 NOTE — Discharge Summary (Addendum)
Physician Discharge Summary  Lawrence Warner SWN:[id number] DOB: 03/23/1918 DOA: 12/21/2020  PCP: Wenda Low, MD  Admit date: 12/21/2020 Discharge date: 12/28/2020  Admitted From: ALF Discharge disposition: SNF   Recommendations for Outpatient Follow-Up:   [facility] Palliative care to follow at SNF- suspect he may decline rapidly as high aspiration risk per speech evaluation, Emphasis on comfort if further decline Consider addition of low dose morphine  BMP 1 week if goals not comfort Titrate O2 SSI    Discharge Diagnosis:   Active Problems:   Idiopathic thrombocytopenic purpura ([place])   Diet-controlled diabetes mellitus ([place])   Hx of CABG   Essential hypertension   GERD (gastroesophageal reflux disease)   Sepsis ([place])   Acute on chronic respiratory failure with hypoxia ([place])   CAD (coronary artery disease)   Acute renal failure superimposed on stage 3b chronic kidney disease ([facility])   (HFpEF) heart failure with preserved ejection fraction ([place])   COVID-19 virus infection   Elevated troponin    Discharge Condition: Improved.  Diet recommendation: Low sodium, heart healthy.  Carbohydrate-modified  Wound care: None.  Code status: DNR   History of Present Illness:   Lawrence Warner is a 87 y.o. male with medical history significant of hypertension, diet-controlled diabetes, CAD with history of CABG, ITP, GERD, stage IIIb CKD, HFpEF, COPD, chronic respiratory failure on 2-3 L oxygen at home who presented with worsening shortness of breath, cough and hypoxia as he takes off his oxygen. History obtained from daughter-in-law [name].  He has been having declining memory issues and has been taking off his oxygen at home. Symptoms started over 10 days ago with fatigue and muscle and joint pain. He got his flu shot on 10/14 and family thought it was due to his flu shot. He has not run any fever. He has a chronic cough, but his cough has gotten progressively  worse with increased sputum production. Daughter in law tested him for covid on 12/17/20 and it was positive. He has not had any fever. He has not needed more oxygen than his baseline at home. 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    He has had quite poor PO intake over the past few weeks.  On palliative care   Could not obtain review of systems as patient would not answer questions or did not know.   Hospital Course by Problem:   COPD exacerbation/chronic hypoxic respiratory failure-on 2 to 3 L at baseline.  Likely due to COVID-19 -Completed course of steroid, CTX and doxycycline on 11/2.   -Continue nebs, IS, OOB, PT/OT -Wean oxygen as able   COVID-19 virus infection:  Symptomatic for 2 weeks POA.  Tested positive at home on 12/17/2020.  COVID-19 PCR positive.  CXR as above.  CRP normalized.  D-dimer slightly elevated.  Pro-Cal negative. -Continue steroid and breathing treatment as above.   Acute on chronic diastolic CHF: TTE in 10/3816 with LVEF of 50 to 55%, impaired relaxation.  Started on IV Lasix with improvement in his breathing.  I&O incomplete.  Appears euvolemic but BNP 313 (b/l)> >>958 >> 1000 concerning for poor prognosis. Cr slightly up today.  BUN up trended as well. -resume lasix- hold if PO intake decreased -Monitor fluid status, renal functions and electrolytes -Monitor renal functions and electrolytes   Markedly elevated D-dimer-reported to be > 20 on 10/29.  Likely erroneous lab.  2.10>  1.6>> 20> 1.5.  LE [place] negative for DVT.   Sepsis ruled out.   Elevated troponin/history of CAD/CABG: 53 >67>> 68.  Likely demand ischemia and and possible myocarditis from COVID-19 infection.  Denies chest pain. -No further cardiac work-up indicated.   AKI/azotemia superimposed on CKD-3B:  -BMp 1 week   New onset A. Fib with bradycardia: HR in 50s at times. -Avoid nodal blocking  agents.   Essential hypertension: Normotensive. -Continue amlodipine  -Continue holding losartan   Diet-controlled diabetes mellitus: A1c 6.3%.  Hyperglycemia likely due to steroid -Will be of steroid on 11/2 -Continue SSI-moderate -Further adjustment as appropriate.   Hyperkalemia: mild -Lokelma 10 g x 1.   Metabolic acidosis-due to renal failure? -Continue sodium bicarbonate 650 mg 3 times daily   Idiopathic thrombocytopenic purpura: Resolved.   Skin cancer on face -outpatient follow up   Anxiety/Panic attack:  -Continue Atarax    Ambulatory dysfunction/debility:  -Therapy recommended SNF   Goal of care counseling-continue medical management as above.  Emphasis on comfort if further decline -Appreciate help by palliative medicine as well -Transfer to SNF with palliative follow-up if stable or emphasis on comfort if he declines.   Decreased oral intake Body mass index is 26.52 kg/m. Nutrition Problem: Increased nutrient needs Etiology: acute illness (COVID-19 infection)-Dietitian consulted. Signs/Symptoms: estimated needs Interventions: Ensure Enlive (each supplement provides 350kcal and 20 grams of protein), MVI    Medical Consultants:   Palliative care   Discharge Exam:   Vitals:   12/27/20 2112 12/28/20 0600  BP: (!) 158/87 (!) 152/77  Pulse: 61 77  Resp: 19 16  Temp: 97.8 F (36.6 C) 98.5 F (36.9 C)  SpO2: 99% 99%   Vitals:   12/27/20 0421 12/27/20 1211 12/27/20 2112 12/28/20 0600  BP: 127/62  (!) 158/87 (!) 152/77  Pulse: 62  61 77  Resp: 16  19 16   Temp: 98.1 F (36.7 C) 98.9 F (37.2 C) 97.8 F (36.6 C) 98.5 F (36.9 C)  TempSrc: Axillary Oral Oral Oral  SpO2: 98%  99% 99%  Weight:    73 kg  Height:        General exam: Appears calm and comfortable.     The results of significant diagnostics from this hospitalization (including imaging, microbiology, ancillary and laboratory) are listed below for reference.     Procedures and  Diagnostic Studies:   DG Chest Portable 1 View  Result Date: 12/21/2020 CLINICAL DATA:  Shortness of breath, COVID positive EXAM: PORTABLE CHEST 1 VIEW COMPARISON:  Chest radiograph 08/09/2020 FINDINGS: Median sternotomy wires and mediastinal surgical clips are stable. The heart is mildly enlarged, unchanged. The mediastinal contours are stable. Patchy airspace disease is seen in the right mid and lower lung, increased since the prior study from 08/09/2020. The left lung is clear. There is no significant pleural effusion. There is no pneumothorax. There is no acute osseous abnormality. IMPRESSION: Patchy airspace disease in the right lung is increased since 08/09/2020. This may reflect ongoing and worsening infection/inflammation. Recommend follow-up radiographs to resolution or chest CT to exclude underlying neoplasm. Electronically Signed   By: Valetta Mole M.D.   On: 12/21/2020 15:26     Labs:   Basic Metabolic Panel: Recent Labs  Lab 2020/10/17 0417 12/24/20 0952 12/25/20 0339 12/26/20 0354 12/27/20 0336  NA 141 144 141 142 144  K 5.9* 5.1 5.0 5.3* 4.9  CL 115* 115* 112* 114* 111  CO2 17* 22 24 17* 26  GLUCOSE 162* 202* 181* 177* 188*  BUN 59* 55* 61* 75* 74*  CREATININE 1.74* 1.63* 1.50* 1.71* 1.52*  CALCIUM 8.6* 8.9 9.1 9.5 9.3  MG 2.2 2.1 2.0 2.0 1.9  PHOS 3.0 3.2 2.6 3.0 3.4   GFR Estimated Creatinine Clearance: 24.2 mL/min (A) (by C-G formula based on SCr of 1.52 mg/dL (H)). Liver Function Tests: Recent Labs  Lab 12/21/20 1456 12/22/20 0039 12/22/2020 0417 12/24/20 0952 12/25/20 0339 12/26/20 0354 12/27/20 0336  AST 22 16  --   --   --   --  50*  ALT 15 13  --   --   --   --  77*  ALKPHOS 56 44  --   --   --   --  53  BILITOT 0.7 0.7  --   --   --   --  0.7  PROT 7.4 6.7  --   --   --   --  5.9*  ALBUMIN 3.0* 2.6* 2.5* 2.5* 2.4* 2.4* 2.4*   No results for input(s): LIPASE, AMYLASE in the last 168 hours. No results for input(s): AMMONIA in the last 168  hours. Coagulation profile No results for input(s): INR, PROTIME in the last 168 hours.  CBC: Recent Labs  Lab 12/21/20 1456 12/21/20 1515 12/22/20 0039 12/22/2020 0417 12/24/20 0952 12/25/20 0339  WBC 10.8*  --  10.2 12.7* 15.5* 12.1*  NEUTROABS 8.8*  --  8.4*  --   --   --   HGB 12.1* 11.2* 11.2* 10.1* 9.9* 10.4*  HCT 39.5 33.0* 35.7* 33.1* 31.9* 33.2*  MCV 96.6  --  93.9 95.4 95.8 94.3  PLT 169  --  151 132* 170 169   Cardiac Enzymes: No results for input(s): CKTOTAL, CKMB, CKMBINDEX, TROPONINI in the last 168 hours. BNP: Invalid input(s): POCBNP CBG: Recent Labs  Lab 12/27/20 0611 12/27/20 1209 12/27/20 1606 12/27/20 2110 12/28/20 0617  GLUCAP 158* 180* 145* 114* 171*   D-Dimer No results for input(s): DDIMER in the last 72 hours. Hgb A1c No results for input(s): HGBA1C in the last 72 hours. Lipid Profile No results for input(s): CHOL, HDL, LDLCALC, TRIG, CHOLHDL, LDLDIRECT in the last 72 hours. Thyroid function studies No results for input(s): TSH, T4TOTAL, T3FREE, THYROIDAB in the last 72 hours.  Invalid input(s): FREET3 Anemia work up No results for input(s): VITAMINB12, FOLATE, FERRITIN, TIBC, IRON, RETICCTPCT in the last 72 hours. Microbiology Recent Results (from the past 240 hour(s))  Resp Panel by RT-PCR (Flu A&B, Covid) Nasopharyngeal Swab     Status: Abnormal   Collection Time: 12/21/20  2:56 PM   Specimen: Nasopharyngeal Swab; Nasopharyngeal(NP) swabs in vial transport medium  Result Value Ref Range Status   SARS Coronavirus 2 by RT PCR POSITIVE (A) NEGATIVE Final    Comment: RESULT CALLED TO, READ BACK BY AND VERIFIED WITHGareth Eagle RN 2119 12/21/20 A BROWNING (NOTE) SARS-CoV-2 target nucleic acids are DETECTED.  The SARS-CoV-2 RNA is generally detectable in upper respiratory specimens during the acute phase of infection. Positive results are indicative of the presence of the identified virus, but do not rule out bacterial infection or  co-infection with other pathogens not detected by the test. Clinical correlation with patient history and other diagnostic information is necessary to determine patient infection status. The expected result is Negative.  Fact Sheet for Patients: EntrepreneurPulse.com.au  Fact Sheet for Healthcare Providers: IncredibleEmployment.be  This test is not yet approved or cleared by the [place] FDA and  has been authorized for detection and/or diagnosis of  SARS-CoV-2 by FDA under an Emergency Use Authorization (EUA).  This EUA will remain in effect (meaning this test c an be used) for the duration of  the COVID-19 declaration under Section 564(b)(1) of the Act, 21 U.S.C. section 360bbb-3(b)(1), unless the authorization is terminated or revoked sooner.     Influenza A by PCR NEGATIVE NEGATIVE Final   Influenza B by PCR NEGATIVE NEGATIVE Final    Comment: (NOTE) The Xpert Xpress SARS-CoV-2/FLU/RSV plus assay is intended as an aid in the diagnosis of influenza from Nasopharyngeal swab specimens and should not be used as a sole basis for treatment. Nasal washings and aspirates are unacceptable for Xpert Xpress SARS-CoV-2/FLU/RSV testing.  Fact Sheet for Patients: EntrepreneurPulse.com.au  Fact Sheet for Healthcare Providers: IncredibleEmployment.be  This test is not yet approved or cleared by the [place] FDA and has been authorized for detection and/or diagnosis of SARS-CoV-2 by FDA under an Emergency Use Authorization (EUA). This EUA will remain in effect (meaning this test can be used) for the duration of the COVID-19 declaration under Section 564(b)(1) of the Act, 21 U.S.C. section 360bbb-3(b)(1), unless the authorization is terminated or revoked.  Performed at [facility], [place] [address], [place], [place] [address]   Blood Culture (routine x 2)     Status: None   Collection Time: 12/21/20   2:56 PM   Specimen: BLOOD  Result Value Ref Range Status   Specimen Description BLOOD RIGHT ANTECUBITAL  Final   Special Requests   Final    BOTTLES DRAWN AEROBIC AND ANAEROBIC Blood Culture adequate volume   Culture   Final    NO GROWTH 5 DAYS Performed at [facility], [place] [address], [place], [place] [address]    Report Status 12/26/2020 FINAL  Final  Blood Culture (routine x 2)     Status: None   Collection Time: 12/21/20  2:56 PM   Specimen: BLOOD  Result Value Ref Range Status   Specimen Description BLOOD LEFT ANTECUBITAL  Final   Special Requests   Final    BOTTLES DRAWN AEROBIC AND ANAEROBIC Blood Culture adequate volume   Culture   Final    NO GROWTH 5 DAYS Performed at [facility], [place] [address], [place], [place] [address]    Report Status 12/26/2020 FINAL  Final  Respiratory (~20 pathogens) panel by PCR     Status: None   Collection Time: 12/21/20  8:13 PM   Specimen: Nasopharyngeal Swab; Respiratory  Result Value Ref Range Status   Adenovirus NOT DETECTED NOT DETECTED Final   Coronavirus 229E NOT DETECTED NOT DETECTED Final    Comment: (NOTE) The Coronavirus on the Respiratory Panel, DOES NOT test for the novel  Coronavirus (2019 nCoV)    Coronavirus HKU1 NOT DETECTED NOT DETECTED Final   Coronavirus NL63 NOT DETECTED NOT DETECTED Final   Coronavirus OC43 NOT DETECTED NOT DETECTED Final   Metapneumovirus NOT DETECTED NOT DETECTED Final   Rhinovirus / Enterovirus NOT DETECTED NOT DETECTED Final   Influenza A NOT DETECTED NOT DETECTED Final   Influenza B NOT DETECTED NOT DETECTED Final   Parainfluenza Virus 1 NOT DETECTED NOT DETECTED Final   Parainfluenza Virus 2 NOT DETECTED NOT DETECTED Final   Parainfluenza Virus 3 NOT DETECTED NOT DETECTED Final   Parainfluenza Virus 4 NOT DETECTED NOT DETECTED Final   Respiratory Syncytial Virus NOT DETECTED NOT DETECTED Final   Bordetella pertussis NOT DETECTED NOT DETECTED Final   Bordetella Parapertussis  NOT DETECTED  NOT DETECTED Final   Chlamydophila pneumoniae NOT DETECTED NOT DETECTED Final   Mycoplasma pneumoniae NOT DETECTED NOT DETECTED Final    Comment: Performed at [facility], [place] [address], [place], [place] [address]     Discharge Instructions:   Discharge Instructions     Diet - low sodium heart healthy   Complete by: As directed    Diet Carb Modified   Complete by: As directed    Increase activity slowly   Complete by: As directed       Allergies as of 12/28/2020       Reactions   Adenosine    Other reaction(s): Heart block   Alfuzosin Hcl Er Other (See Comments)   Confusion/Uroxatral   Ciprofloxacin Nausea Only   Other reaction(s): Nausea, Dizziness, Blurring of visual image, Nausea, Dizziness, Blurring of visual image   Imdur [isosorbide Dinitrate] Other (See Comments)   Unknown reaction   Lisinopril    Other reaction(s): COUGHING   Niacin    Other reaction(s): Muscle pain   Penicillins Hives, Other (See Comments)   Has patient had a PCN reaction causing immediate rash, facial/tongue/throat swelling, SOB or lightheadedness with hypotension: Yes Has patient had a PCN reaction causing severe rash involving mucus membranes or skin necrosis: Unknown Has patient had a PCN reaction that required hospitalization: Unknown Has patient had a PCN reaction occurring within the last 10 years: Unknown If all of the above answers are "NO", then may proceed with Cephalosporin use.   Tape Other (See Comments)   SKIN IS VERY THIN AND WILL TEAR EASILY   Tuberculin Purified Protein Derivative Hives, Nausea And Vomiting   Other reaction(s): Low blood pressure, Urticaria, Nausea and vomiting        Medication List     STOP taking these medications    cephALEXin 500 MG capsule Commonly known as: KEFLEX   losartan 100 MG tablet Commonly known as: COZAAR       TAKE these medications    acetaminophen 500 MG tablet Commonly known as: TYLENOL Take 500-1,000  mg by mouth every 6 (six) hours as needed for mild pain.   amLODipine 10 MG tablet Commonly known as: NORVASC Take 1 tablet (10 mg total) by mouth daily.   ascorbic acid 500 MG tablet Commonly known as: VITAMIN C Take 1 tablet (500 mg total) by mouth daily.   aspirin EC 81 MG tablet Take 81 mg by mouth 2 (two) times a week. Sunday's and Wednesday's   cycloSPORINE 0.05 % ophthalmic emulsion Commonly known as: RESTASIS Place 1 drop into both eyes every 12 (twelve) hours.   feeding supplement Liqd Take 237 mLs by mouth 3 (three) times daily between meals.   furosemide 20 MG tablet Commonly known as: LASIX Take 20 mg by mouth daily.   guaiFENesin-dextromethorphan 100-10 MG/5ML syrup Commonly known as: ROBITUSSIN DM Take 10 mLs by mouth every 4 (four) hours as needed for cough.   hydrOXYzine 25 MG tablet Commonly known as: ATARAX/VISTARIL Take 25 mg by mouth at bedtime.   insulin aspart 100 UNIT/ML injection Commonly known as: novoLOG Inject 0-15 Units into the skin 3 (three) times daily with meals.   ipratropium-albuterol 0.5-2.5 (3) MG/3ML Soln Commonly known as: DUONEB Take 3 mLs by nebulization every 2 (two) hours as needed.   OXYGEN Inhale 2.5-3 L into the lungs continuous.   VITAMIN B12 PO Take 1 tablet by mouth daily.   zinc sulfate 220 (50 Zn) MG capsule Take 1 capsule (220  mg total) by mouth daily.          Time coordinating discharge: 35 min  Signed:  Geradine Girt DO  [facility] 12/28/2020, 9:22 AM

## 2020-12-29 ENCOUNTER — Encounter: Payer: Self-pay | Admitting: Adult Health

## 2020-12-29 ENCOUNTER — Non-Acute Institutional Stay (SKILLED_NURSING_FACILITY): Payer: Medicare Other | Admitting: Adult Health

## 2020-12-29 DIAGNOSIS — J1282 Pneumonia due to coronavirus disease 2019: Secondary | ICD-10-CM

## 2020-12-29 DIAGNOSIS — J9621 Acute and chronic respiratory failure with hypoxia: Secondary | ICD-10-CM

## 2020-12-29 DIAGNOSIS — I10 Essential (primary) hypertension: Secondary | ICD-10-CM

## 2020-12-29 DIAGNOSIS — I5033 Acute on chronic diastolic (congestive) heart failure: Secondary | ICD-10-CM | POA: Diagnosis not present

## 2020-12-29 DIAGNOSIS — E1122 Type 2 diabetes mellitus with diabetic chronic kidney disease: Secondary | ICD-10-CM | POA: Diagnosis not present

## 2020-12-29 DIAGNOSIS — N1832 Chronic kidney disease, stage 3b: Secondary | ICD-10-CM

## 2020-12-29 DIAGNOSIS — U071 COVID-19: Secondary | ICD-10-CM | POA: Diagnosis not present

## 2020-12-29 DIAGNOSIS — F419 Anxiety disorder, unspecified: Secondary | ICD-10-CM

## 2020-12-29 NOTE — Progress Notes (Signed)
Location:  [facility] Room Number: 306-A Place of Service:  SNF (31) Provider:  Durenda Age, DNP, FNP-BC  Patient Care Team: Wenda Low, MD as PCP - General (Internal Medicine) Minus Breeding, MD as PCP - Cardiology (Cardiology) Rico Sheehan (Hematology and Oncology)  Extended Emergency Contact Information Primary Emergency Contact: Beilke,[name] Address: [address]          [place], [place] [address] [place] Relation: Son Secondary Emergency Contact: Darius Bump [place] Phone: [phone] Mobile Phone: [phone] Relation: Son  Code Status: DNR   Goals of care: Advanced Directive information Advanced Directives 12/29/2020  Does Patient Have a Medical Advance Directive? Yes  Type of Advance Directive Out of facility DNR (pink MOST or yellow form)  Does patient want to make changes to medical advance directive? No - Patient declined  Copy of [place] in Chart? -  Would patient like information on creating a medical advance directive? -  Pre-existing out of facility DNR order (yellow form or pink MOST form) Yellow form placed in chart (order not valid for inpatient use)     Chief Complaint  Patient presents with   Hospitalization Follow-up    HPI:  Pt is a 87 y.o. male who was admitted to [facility] on 12/28/2020 post hospital admission 12/21/2020 to 12/28/2020.  He has a PMH of hypertension, diet-controlled diabetes, CAD with history of CABG, ITP, GERD, chronic kidney disease is stage IIIb, HFpEF, COPD and chronic respiratory failure on 2 to 3 L O2 at home.  He presented to the hospital with worsening shortness of breath, cough and hypoxia as he takes off his oxygen.  He had his flu shot on 12/08/2020.  Symptoms started 10 days prior to hospitalization, fatigue, muscle and joint pain and progressively worsening.  Daughter-in-law tested him  for COVID on 12/17/2020 which turned out to be positive.  There was no reported fever.  EMS was called by his office manager due to difficulty breathing and his oxygen was off.  He had 4 COVID vaccines.  Chest x-ray showed patchy airspace disease in the right lung.  He was a started on systemic steroids, ceftriaxone and azithromycin.  Hospitalization was complicated by worsening respiratory status for which she was given IV Lasix.  Palliative medicine was consulted.  He had worsening renal function so IV Lasix was held.  Upon discharge, oral Lasix was resumed.  He was seen in his room today with daughter-in-law at bedside.  He was noted to be sleepy.  Past Medical History:  Diagnosis Date   Basal cell carcinoma 10/23/2017   LEFT TEMPLE CX3 5FU   BCC (basal cell carcinoma of skin)    BPH (benign prostatic hypertrophy)    Chronic heart failure (HCC)    Chronic renal insufficiency    Coronary artery disease    CABG in 2000   Diabetes type 2, controlled ([place])    Diet-controlled   Diabetic neuropathy (HCC)    Dyslipidemia    Hypertension    ITP (idiopathic thrombocytopenic purpura)    Osteoarthritis    Pneumonia    SCC (squamous cell carcinoma) 10/23/2017   RIGHT SIDEBURN TX=CX3 5FU CAUTERY   SCC (squamous cell carcinoma) 04/23/2018   RIGHT JAWLINE TX=EXC   Squamous cell carcinoma of skin 08/17/2015   LEFT HAND CX3 5FU CAUTERY   Past Surgical History:  Procedure Laterality Date   CHOLECYSTECTOMY     CORONARY ARTERY BYPASS GRAFT  2000   Right hip replacement  2014   TONSILLECTOMY      Allergies  Allergen Reactions   Adenosine     Other reaction(s): Heart block   Alfuzosin Hcl Er Other (See Comments)    Confusion/Uroxatral    Ciprofloxacin Nausea Only    Other reaction(s): Nausea, Dizziness, Blurring of visual image, Nausea, Dizziness, Blurring of visual image   Imdur [Isosorbide Dinitrate] Other (See Comments)    Unknown reaction   Lisinopril     Other reaction(s): COUGHING    Niacin     Other reaction(s): Muscle pain   Penicillins Hives and Other (See Comments)    Has patient had a PCN reaction causing immediate rash, facial/tongue/throat swelling, SOB or lightheadedness with hypotension: Yes Has patient had a PCN reaction causing severe rash involving mucus membranes or skin necrosis: Unknown Has patient had a PCN reaction that required hospitalization: Unknown Has patient had a PCN reaction occurring within the last 10 years: Unknown If all of the above answers are "NO", then may proceed with Cephalosporin use.    Tape Other (See Comments)    SKIN IS VERY THIN AND WILL TEAR EASILY   Tuberculin Purified Protein Derivative Hives and Nausea And Vomiting    Other reaction(s): Low blood pressure, Urticaria, Nausea and vomiting    Outpatient Encounter Medications as of 12/29/2020  Medication Sig   acetaminophen (TYLENOL) 500 MG tablet Take 500-1,000 mg by mouth every 6 (six) hours as needed for mild pain.    amLODipine (NORVASC) 10 MG tablet Take 1 tablet (10 mg total) by mouth daily.   ascorbic acid (VITAMIN C) 500 MG tablet Take 1 tablet (500 mg total) by mouth daily.   aspirin EC 81 MG tablet Take 81 mg by mouth 2 (two) times a week. Sunday's and Wednesday's   bisacodyl (DULCOLAX) 10 MG suppository as needed. If not relieved by MOM, give 10 mg Bisacodyl suppositiory rectally X 1 dose in 24 hours as needed   Cyanocobalamin (VITAMIN B12 PO) Take 1 tablet by mouth daily.   cycloSPORINE (RESTASIS) 0.05 % ophthalmic emulsion Place 1 drop into both eyes every 12 (twelve) hours.    feeding supplement (ENSURE ENLIVE / ENSURE PLUS) LIQD Take 237 mLs by mouth 3 (three) times daily between meals.   furosemide (LASIX) 20 MG tablet Take 20 mg by mouth daily.   guaiFENesin-dextromethorphan (ROBITUSSIN DM) 100-10 MG/5ML syrup Take 10 mLs by mouth every 4 (four) hours as needed for cough.   hydrOXYzine (ATARAX/VISTARIL) 25 MG tablet Take 25 mg by mouth at bedtime.    ipratropium-albuterol (DUONEB) 0.5-2.5 (3) MG/3ML SOLN Take 3 mLs by nebulization every 2 (two) hours as needed.   Magnesium Hydroxide (MILK OF MAGNESIA PO) If no BM in 3 days, give 30 cc Milk of Magnesium p.o. x 1 dose in 24 hours as needed   NON FORMULARY Diet:Mech soft/nectar   OXYGEN Inhale 2.5-3 L into the lungs continuous.   Sodium Phosphates (RA SALINE ENEMA RE) Place rectally. If not relieved by Biscodyl suppository, give disposable Saline Enema rectally X 1 dose/24 hrs as needed   zinc sulfate 220 (50 Zn) MG capsule Take 1 capsule (220 mg total) by mouth daily.   [DISCONTINUED] insulin aspart (NOVOLOG) 100 UNIT/ML injection Inject 0-15 Units into the skin 3 (three) times daily with meals.   No facility-administered encounter medications on file as of 12/29/2020.    Review of Systems  Unable to obtain due to being sleepy.   Immunization History  Administered Date(s) Administered   Fluad Quad(high Dose 65+) 11/14/2018   Influenza,inj,Quad PF,6+ Mos 11/19/2016, 11/25/2017   Influenza-Unspecified 04/24/2015   Moderna Sars-Covid-2 Vaccination 03/29/2019, 04/26/2019, 11/25/2019, 09/05/2020   Pneumococcal Conjugate-13 05/04/2015   Pneumococcal Polysaccharide-23 05/09/2012, 05/19/2017   Tdap 05/04/2015   Zoster, Live 02/08/2014   Pertinent  Health Maintenance Due  Topic Date Due   FOOT EXAM  Never done   OPHTHALMOLOGY EXAM  Never done   INFLUENZA VACCINE  09/25/2020   Fall Risk 12/26/2020 12/26/2020 12/27/2020 12/27/2020 12/28/2020  Falls in the past year? - - - - -  Was there an injury with Fall? - - - - -  Patient Fall Risk Level High fall risk High fall risk High fall risk High fall risk High fall risk     Vitals:   12/29/20 1335  BP: (!) 116/53  Pulse: (!) 55  Resp: 17  Temp: (!) 97.2 F (36.2 C)  Weight: 161 lb 12.8 oz (73.4 kg)  Height: 5\' 7"  (1.702 m)   Body mass index is 25.34 kg/m.  Physical Exam  GENERAL APPEARANCE: Well nourished. In no acute distress.  Normal body habitus SKIN:  Left ear with lesion, dry and no erythema MOUTH and THROAT: Lips are without lesions. Oral mucosa is moist and without lesions.  RESPIRATORY: Breathing is even & unlabored, BS CTAB CARDIAC: RRR, no murmur,no extra heart sounds, no edema GI: Abdomen soft, normal BS, no masses, no tenderness NEUROLOGICAL: There is no tremor. Sleepy. PSYCHIATRIC:  Affect and behavior are appropriate  Labs reviewed: Recent Labs    12/25/20 0339 12/26/20 0354 12/27/20 0336  NA 141 142 144  K 5.0 5.3* 4.9  CL 112* 114* 111  CO2 24 17* 26  GLUCOSE 181* 177* 188*  BUN 61* 75* 74*  CREATININE 1.50* 1.71* 1.52*  CALCIUM 9.1 9.5 9.3  MG 2.0 2.0 1.9  PHOS 2.6 3.0 3.4   Recent Labs    12/21/20 1456 12/22/20 0039 08-30-20 0417 12/25/20 0339 12/26/20 0354 12/27/20 0336  AST 22 16  --   --   --  50*  ALT 15 13  --   --   --  77*  ALKPHOS 56 44  --   --   --  53  BILITOT 0.7 0.7  --   --   --  0.7  PROT 7.4 6.7  --   --   --  5.9*  ALBUMIN 3.0* 2.6*   < > 2.4* 2.4* 2.4*   < > = values in this interval not displayed.   Recent Labs    08/09/20 0455 12/21/20 1456 12/21/20 1515 12/22/20 0039 08-30-20 0417 12/24/20 0952 12/25/20 0339  WBC 5.7 10.8*  --  10.2 12.7* 15.5* 12.1*  NEUTROABS 3.9 8.8*  --  8.4*  --   --   --   HGB 10.8* 12.1*   < > 11.2* 10.1* 9.9* 10.4*  HCT 34.3* 39.5   < > 35.7* 33.1* 31.9* 33.2*  MCV 93.0 96.6  --  93.9 95.4 95.8 94.3  PLT 122* 169  --  151 132* 170 169   < > = values in this interval not displayed.   Lab Results  Component Value Date   TSH 0.324 (L) 03/15/2017   Lab Results  Component Value Date   HGBA1C 6.3 (H) 12/22/2020   Lab Results  Component Value Date   CHOL 92 05/18/2017   HDL 25 (L) 05/18/2017   LDLCALC 47 05/18/2017   TRIG 199 (H)  12/21/2020   CHOLHDL 3.7 05/18/2017    Significant Diagnostic Results in last 30 days:  DG Chest Port 1 View  Result Date: 12/25/2020 CLINICAL DATA:  Dyspnea.  Cough.  History of  COPD and COVID. EXAM: PORTABLE CHEST 1 VIEW COMPARISON:  12/23/2020; 12/21/2020; 08/19/2020; 01/09/2019 FINDINGS: Grossly unchanged enlarged cardiac silhouette and mediastinal contours post median sternotomy. Grossly unchanged right basilar heterogeneous airspace opacity. Unchanged bibasilar heterogeneous opacities favored to represent atelectasis. Trace pleural effusions are not excluded. No pneumothorax. No definite evidence of edema. No acute osseous abnormalities. Degenerative change the right glenohumeral joint is suspected though incompletely evaluated. IMPRESSION: 1. Unchanged right basilar heterogeneous airspace opacity worrisome for infection and/or aspiration. 2. Similar appearing bibasilar atelectasis and suspected trace bilateral effusions without evidence of edema. Electronically Signed   By: Sandi Mariscal M.D.   On: 12/25/2020 07:50   DG Chest Port 1 View  Result Date: 12/23/2020 CLINICAL DATA:  Shortness of breath.  COVID positive. EXAM: PORTABLE CHEST 1 VIEW COMPARISON:  12/21/2020 FINDINGS: The cardiac silhouette, mediastinal and hilar contours are stable. Persistent bilateral infiltrates. No definite pleural effusions or pneumothorax. IMPRESSION: Persistent bilateral infiltrates. Electronically Signed   By: Marijo Sanes M.D.   On: 12/23/2020 14:29   DG Chest Portable 1 View  Result Date: 12/21/2020 CLINICAL DATA:  Shortness of breath, COVID positive EXAM: PORTABLE CHEST 1 VIEW COMPARISON:  Chest radiograph 08/09/2020 FINDINGS: Median sternotomy wires and mediastinal surgical clips are stable. The heart is mildly enlarged, unchanged. The mediastinal contours are stable. Patchy airspace disease is seen in the right mid and lower lung, increased since the prior study from 08/09/2020. The left lung is clear. There is no significant pleural effusion. There is no pneumothorax. There is no acute osseous abnormality. IMPRESSION: Patchy airspace disease in the right lung is increased since  08/09/2020. This may reflect ongoing and worsening infection/inflammation. Recommend follow-up radiographs to resolution or chest CT to exclude underlying neoplasm. Electronically Signed   By: Valetta Mole M.D.   On: 12/21/2020 15:26   DG Swallowing Func-Speech Pathology  Result Date: 12/27/2020 Table formatting from the original result was not included. Objective Swallowing Evaluation: Type of Study: MBS-Modified Barium Swallow Study  Patient Details Name: Juergen Hardenbrook MRN: [id number] Date of Birth: 11/20/1918 Today's Date: 12/27/2020 Time: SLP Start Time (ACUTE ONLY): 1400 -SLP Stop Time (ACUTE ONLY): 1430 SLP Time Calculation (min) (ACUTE ONLY): 30 min Past Medical History: Past Medical History: Diagnosis Date  Basal cell carcinoma 10/23/2017  LEFT TEMPLE CX3 5FU  BCC (basal cell carcinoma of skin)   BPH (benign prostatic hypertrophy)   Chronic heart failure (HCC)   Chronic renal insufficiency   Coronary artery disease   CABG in 2000  Diabetes type 2, controlled ([place])   Diet-controlled  Diabetic neuropathy (HCC)   Dyslipidemia   Hypertension   ITP (idiopathic thrombocytopenic purpura)   Osteoarthritis   Pneumonia   SCC (squamous cell carcinoma) 10/23/2017  RIGHT SIDEBURN TX=CX3 5FU CAUTERY  SCC (squamous cell carcinoma) 04/23/2018  RIGHT JAWLINE TX=EXC  Squamous cell carcinoma of skin 08/17/2015  LEFT HAND CX3 5FU CAUTERY Past Surgical History: Past Surgical History: Procedure Laterality Date  CHOLECYSTECTOMY    CORONARY ARTERY BYPASS GRAFT  2000  Right hip replacement  2014  TONSILLECTOMY   HPI: Patient is a 87 y.o. male with PMH: COPD/chronic hypoxic RF on 2 to 3 L, diastolic CHF, CAD/CABG, DM-2, ITP, CKD-3B, HTN and debility followed by palliative care at home presenting with shortness of breath, productive cough  and fatigue for 10 days, and admitted with acute on chronic respiratory failure with hypoxia felt to be due to COPD exacerbation in the setting of COVID-19 infection and possible  community-acquired pneumonia.  COVID-19 test positive.  CXR with patchy airspace disease in the right lung. He has a productive cough.  Subjective: alert but drowsy, seems confused Assessment / Plan / Recommendation CHL IP CLINICAL IMPRESSIONS 12/27/2020 Clinical Impression Pt demonstrates a severe dysphagia, likely chronic in nature given advanced age and signs of degenerative changes in the cervical spine impacting swallowing. Pt unable to move his neck, states "its frozen" preventing many compensatory strategies and likely also increasing risk of aspiration in suboptimal positioning in hospital bed. Pt has instances of aspiration before and after the swallow with thin, nectar and honey thick liquids given limited epiglottic movement due to decreased hyoid excursion and structural changes of cervical spine. There is mild residue with thin and severe residue with increased viscosity. Pt does have intermittent cough response to aspiration, but cued coughing increases efficacy of airway protection. Pt will not benefit from diet modfication, is recommended to continue regular solids and thin liquids. Cues for increased frequency and force of cough chould be helpful. Risk of aspiration will be ongoing and significant. SLP Visit Diagnosis Dysphagia, pharyngeal phase (R13.13) Attention and concentration deficit following -- Frontal lobe and executive function deficit following -- Impact on safety and function Severe aspiration risk   CHL IP TREATMENT RECOMMENDATION 12/27/2020 Treatment Recommendations Therapy as outlined in treatment plan below   Prognosis 12/22/2020 Prognosis for Safe Diet Advancement Good Barriers to Reach Goals -- Barriers/Prognosis Comment -- CHL IP DIET RECOMMENDATION 12/27/2020 SLP Diet Recommendations Regular solids;Thin liquid Liquid Administration via Cup;Straw Medication Administration Whole meds with puree Compensations Hard cough after swallow Postural Changes Remain semi-upright after after  feeds/meals (Comment);Seated upright at 90 degrees   CHL IP OTHER RECOMMENDATIONS 12/27/2020 Recommended Consults -- Oral Care Recommendations Oral care BID Other Recommendations --   CHL IP FOLLOW UP RECOMMENDATIONS 12/27/2020 Follow up Recommendations Skilled Nursing facility   [facility] IP FREQUENCY AND DURATION 12/27/2020 Speech Therapy Frequency (ACUTE ONLY) min 2x/week Treatment Duration 2 weeks      CHL IP ORAL PHASE 12/27/2020 Oral Phase Impaired Oral - Pudding Teaspoon -- Oral - Pudding Cup -- Oral - Honey Teaspoon -- Oral - Honey Cup WFL Oral - Nectar Teaspoon -- Oral - Nectar Cup WFL Oral - Nectar Straw -- Oral - Thin Teaspoon Decreased bolus cohesion Oral - Thin Cup Decreased bolus cohesion Oral - Thin Straw -- Oral - Puree WFL Oral - Mech Soft -- Oral - Regular WFL Oral - Multi-Consistency -- Oral - Pill -- Oral Phase - Comment --  CHL IP PHARYNGEAL PHASE 12/27/2020 Pharyngeal Phase Impaired Pharyngeal- Pudding Teaspoon -- Pharyngeal -- Pharyngeal- Pudding Cup -- Pharyngeal -- Pharyngeal- Honey Teaspoon -- Pharyngeal -- Pharyngeal- Honey Cup Reduced anterior laryngeal mobility;Reduced epiglottic inversion;Penetration/Aspiration before swallow;Penetration/Apiration after swallow;Moderate aspiration;Pharyngeal residue - valleculae;Pharyngeal residue - pyriform Pharyngeal Material enters airway, passes BELOW cords and not ejected out despite cough attempt by patient Pharyngeal- Nectar Teaspoon -- Pharyngeal -- Pharyngeal- Nectar Cup Penetration/Aspiration before swallow;Penetration/Apiration after swallow;Moderate aspiration;Pharyngeal residue - valleculae;Pharyngeal residue - pyriform;Reduced epiglottic inversion;Reduced anterior laryngeal mobility Pharyngeal Material enters airway, passes BELOW cords without attempt by patient to eject out (silent aspiration) Pharyngeal- Nectar Straw -- Pharyngeal -- Pharyngeal- Thin Teaspoon Penetration/Apiration after swallow;Penetration/Aspiration before swallow;Moderate  aspiration;Pharyngeal residue - valleculae;Pharyngeal residue - pyriform;Reduced epiglottic inversion;Reduced anterior laryngeal mobility Pharyngeal Material enters airway, passes BELOW  cords then ejected out;Material enters airway, passes BELOW cords without attempt by patient to eject out (silent aspiration) Pharyngeal- Thin Cup Penetration/Apiration after swallow;Penetration/Aspiration before swallow;Moderate aspiration;Pharyngeal residue - valleculae;Pharyngeal residue - pyriform;Reduced epiglottic inversion;Reduced anterior laryngeal mobility Pharyngeal Material enters airway, passes BELOW cords without attempt by patient to eject out (silent aspiration);Material enters airway, passes BELOW cords then ejected out Pharyngeal- Thin Straw -- Pharyngeal -- Pharyngeal- Puree -- Pharyngeal -- Pharyngeal- Mechanical Soft -- Pharyngeal -- Pharyngeal- Regular -- Pharyngeal -- Pharyngeal- Multi-consistency -- Pharyngeal -- Pharyngeal- Pill -- Pharyngeal -- Pharyngeal Comment --  No flowsheet data found. DeBlois, Katherene Ponto 12/27/2020, 3:09 PM              VAS [place] LOWER EXTREMITY VENOUS (DVT)  Result Date: 12/25/2020  Lower Venous DVT Study Patient Name:  BURDETT PINZON  Date of Exam:   12/24/2020 Medical Rec #: [id number]         Accession #:    [id number] Date of Birth: 06-10-1918        Patient Gender: M Patient Age:   27 years Exam Location:  [facility] Procedure:      VAS [place] LOWER EXTREMITY VENOUS (DVT) Referring Phys: Bretta Bang GONFA --------------------------------------------------------------------------------  Indications: Covid, elevated D-dimer.  Comparison Study: Prior bilateral lower extremity venous duplex done 05/19/2017 Performing Technologist: Sharion Dove RVS  Examination Guidelines: A complete evaluation includes B-mode imaging, spectral Doppler, color Doppler, and power Doppler as needed of all accessible portions of each vessel. Bilateral testing is considered an integral part of a  complete examination. Limited examinations for reoccurring indications may be performed as noted. The reflux portion of the exam is performed with the patient in reverse Trendelenburg.  +---------+---------------+---------+-----------+----------+------------------+ RIGHT    CompressibilityPhasicitySpontaneityPropertiesThrombus Aging     +---------+---------------+---------+-----------+----------+------------------+ CFV      Full                                         pulsatile waveform +---------+---------------+---------+-----------+----------+------------------+ SFJ      Full                                                            +---------+---------------+---------+-----------+----------+------------------+ FV Prox  Full                                                            +---------+---------------+---------+-----------+----------+------------------+ FV Mid   Full                                                            +---------+---------------+---------+-----------+----------+------------------+ FV DistalFull                                                            +---------+---------------+---------+-----------+----------+------------------+  PFV      Full                                                            +---------+---------------+---------+-----------+----------+------------------+ POP      Full                                         pulsatile waveform +---------+---------------+---------+-----------+----------+------------------+ PTV      Full                                                            +---------+---------------+---------+-----------+----------+------------------+ PERO     Full                                                            +---------+---------------+---------+-----------+----------+------------------+   +---------+---------------+---------+-----------+----------+------------------+  LEFT     CompressibilityPhasicitySpontaneityPropertiesThrombus Aging     +---------+---------------+---------+-----------+----------+------------------+ CFV      Full                                         pulsatile waveform +---------+---------------+---------+-----------+----------+------------------+ SFJ      Full                                                            +---------+---------------+---------+-----------+----------+------------------+ FV Prox  Full                                                            +---------+---------------+---------+-----------+----------+------------------+ FV Mid   Full                                                            +---------+---------------+---------+-----------+----------+------------------+ FV DistalFull                                                            +---------+---------------+---------+-----------+----------+------------------+ PFV      Full                                                            +---------+---------------+---------+-----------+----------+------------------+  POP      Full                                         pulsatile waveform +---------+---------------+---------+-----------+----------+------------------+ PTV      Full                                                            +---------+---------------+---------+-----------+----------+------------------+ PERO     Full                                                            +---------+---------------+---------+-----------+----------+------------------+     Summary: BILATERAL: - No evidence of deep vein thrombosis seen in the lower extremities, bilaterally. - RIGHT: pulsatile waveforms suggestive of fluid overload  LEFT: Pulsatile waveforms suggestive of fluid overload.  *See table(s) above for measurements and observations. Electronically signed by Orlie Pollen on 12/25/2020 at 2:37:07 PM.     Final     Assessment/Plan  1. Acute on chronic respiratory failure with hypoxia (HCC) -    thought to be due to COVID-19 pneumonia and CHF -    was a started on systemic steroids, ceftriaxone and azithromycin -    was given IV Lasix then transitioned to oral Lasix -    on O2 @ 3L/min via [place] continuously  2.  Pneumonia due to COVID-19 virus  was a started on systemic steroids, ceftriaxone and azithromycin  3. Acute on chronic diastolic congestive heart failure (HCC) -   Stable, continue 20 mg daily  4. Type 2 diabetes mellitus with stage 3b chronic kidney disease, without long-term current use of insulin (HCC) Lab Results  Component Value Date   HGBA1C 6.3 (H) 12/22/2020   -  He completed taking steroids -  will discontinue NovoLog and CBG checks  5. Anxiety -   mood is stable, sleepy continue hydroxyzine 25 mg at bedtime PRN X 14 days  6.  Essential hypertension -   Continue Norvasc 10 mg daily    Family/ staff Communication: Discussed plan of care with daughter in law and charge nurse.  Labs/tests ordered:   Will have BMP and CBC on Jan 30, 2021  Goals of care:   Short-term care/palliative care   Durenda Age, DNP, MSN, FNP-BC [facility] [phone] (Monday-Friday 8:00 a.m. - 5:00 p.m.) [phone] (after hours)

## 2020-12-30 ENCOUNTER — Non-Acute Institutional Stay (SKILLED_NURSING_FACILITY): Payer: Medicare Other | Admitting: Internal Medicine

## 2020-12-30 ENCOUNTER — Encounter: Payer: Self-pay | Admitting: Internal Medicine

## 2020-12-30 DIAGNOSIS — N179 Acute kidney failure, unspecified: Secondary | ICD-10-CM | POA: Diagnosis not present

## 2020-12-30 DIAGNOSIS — U071 COVID-19: Secondary | ICD-10-CM

## 2020-12-30 DIAGNOSIS — J690 Pneumonitis due to inhalation of food and vomit: Secondary | ICD-10-CM | POA: Diagnosis not present

## 2020-12-30 DIAGNOSIS — N1832 Chronic kidney disease, stage 3b: Secondary | ICD-10-CM

## 2020-12-30 DIAGNOSIS — J9621 Acute and chronic respiratory failure with hypoxia: Secondary | ICD-10-CM

## 2020-12-30 NOTE — Patient Instructions (Signed)
See assessment and plan under each diagnosis in the problem list and acutely for this visit 

## 2020-12-30 NOTE — Progress Notes (Signed)
NURSING HOME LOCATION:  [facility] ROOM NUMBER:    CODE STATUS:  DNR  PCP: Wenda Low MD  This is a comprehensive admission note to this SNFperformed on this date less than 30 days from date of admission. Included are preadmission medical/surgical history; reconciled medication list; family history; social history and comprehensive review of systems.  Corrections and additions to the records were documented. Comprehensive physical exam was also performed. Additionally a clinical summary was entered for each active diagnosis pertinent to this admission in the Problem List to enhance continuity of care.  HPI: He was hospitalized 10/27 - 12/28/2020 admitted from an ALF presenting with worsening shortness of breath, cough, and hypoxia.  This is in the context of longstanding COPD and chronic respiratory failure for which he is on nasal oxygen 2-3 L.  Symptoms started approximately 10 days prior to admission with fatigue, joint pain, & myalgias.  He received a flu shot on 10/14 and family initially thought this was an adverse drug reaction to the immunization.  He has a chronic cough but this exacerbated with increased sputum production.  His daughter-in-law tested him for COVID on 10/23; results were positive.  He informed administration at the ALF that he could not breathe and was noted to exhibit chest retractions with respiratory effort prompting EMS transport to the ED. Chest x-ray revealed chronic right basilar airspace opacity suggesting infection versus aspiration.  Bibasilar atelectatic changes were also present. He exhibited AKI superimposed on CKD.  Creatinine was 2.38 and GFR 24 indicating stage IV.  With rehydration creatinine was 1.52 and GFR 41 at discharge indicating CKD stage IIIb.   D-dimer was over 20 but did decrease to 1.9. Lower extremity ultrasound was negative for DVT. It was questioned that the original value was a lab error.  C-reactive protein  peaked at 4.7 and was 0.7 prior to discharge.   BNP peaked at 1047.8 but decreased to 607 prior to discharge post IV Lasix.  TTE in September 2020 had revealed LVEF 50-55% with impaired LV relaxation suggesting diastolic dysfunction.   Course was complicated by new onset A. fib; heart rate was in the 50s intermittently. Peak troponin was 68 attributed to demand ischemia and possible COVID associated myocarditis.   Barium swallow revealed severe dysphagia.He also exhibited protein caloric malnutrition with albumin of 2.4 and total protein of 5.9.  Because of poor intake prior to admission at the ALF; palliative care had been initiated. He received a course of steroids and doxycycline and aggressive pulmonary toilet was continued. He was discharged to the SNF for rehab.  Past medical and surgical history: Includes idiopathic thrombocytopenic purpura, squamous and basal cell cancers, history of BPH, diabetes with nephropathy, dyslipidemia, and essential hypertension. Surgeries and procedures include cholecystectomy, right THA, and CABG.  Social history: Nondrinker; 50-pack-year history of smoking.  Family history: Noncontributory due to advanced age.   Review of systems: Clinical neurocognitive deficits made validity of responses questionable & prevented ROS completion. BIMS score 7/15. At my initial visit he was asleep and did not rouse adequately to allow completion of review of systems.  I did return later and was able to get limited history as follows. He could not tell me why he had been hospitalized stating that "got in bad shape".  Subsequently he made the comment "I am in good shape".  He states he has been short of breath "all my life".  He did validate snoring without apnea.    Physical exam:  Pertinent  or positive findings: He appears his stated age.  Pattern alopecia is present; he is unshaven.  He is appears suboptimally nourished.  Initially as noted he was soundly asleep.  He  exhibited hypopnea, low-grade snoring, and intermittent apnea.  His mouth was agape.  Initially he was not wearing dentures.  Eyes are sunken.  Eyebrows are decreased laterally.  Lacrimal glands are prominent.  Lower lids are puffy.  Breath sounds are decreased greater on the right than the left.  Heart sounds are markedly distant.  Bowel sounds are active and heard in the left chest.  He has 1/2+ edema at the sock line.  Pedal pulses are nonpalpable.  There is bruising over the upper extremities greater on the right than the left especially over the dorsum of the hand.  There is a boss of the right forehead with irregular eschar formation.  There is a warty appearing, nodule over the left external ear. Subsequently I came back to examine the patient and he was easily aroused.  At that time he was wearing dentures.  General appearance: no acute distress, increased work of breathing is present.   Lymphatic: No lymphadenopathy about the head, neck, axilla. Eyes: No conjunctival inflammation or lid edema is present. There is no scleral icterus. Nose:  External nasal examination shows no deformity or inflammation. Nasal mucosa are pink and moist without lesions, exudates Neck:  No thyromegaly, masses, tenderness noted.    Heart:  No gallop, murmur, click, rub.  Lungs:  without wheezes, rhonchi, rales, rubs. Abdomen:  Abdomen is soft and nontender with no organomegaly, hernias, masses. GU: Deferred  Extremities:  No cyanosis, clubbing. Neurologic exam: Balance, Rhomberg, finger to nose testing could not be completed due to clinical state Skin: Warm & dry w/o tenting.  See clinical summary under each active problem in the Problem List with associated updated therapeutic plan

## 2021-01-01 NOTE — Assessment & Plan Note (Signed)
Med list reviewed; no nephrotoxic drugs documented.

## 2021-01-01 NOTE — Assessment & Plan Note (Signed)
Speech therapy following the patient at SNF; nectar soft diet initiated.

## 2021-01-01 NOTE — Assessment & Plan Note (Signed)
Presently afebrile with adequate oxygenation.

## 2021-01-01 NOTE — Assessment & Plan Note (Signed)
No active COVID signs or symptoms.

## 2021-01-03 ENCOUNTER — Inpatient Hospital Stay (HOSPITAL_COMMUNITY)
Admission: EM | Admit: 2021-01-03 | Discharge: 2021-01-25 | DRG: 951 | Disposition: E | Payer: Medicare Other | Attending: Internal Medicine | Admitting: Internal Medicine

## 2021-01-03 ENCOUNTER — Encounter: Payer: Self-pay | Admitting: Adult Health

## 2021-01-03 ENCOUNTER — Non-Acute Institutional Stay (SKILLED_NURSING_FACILITY): Payer: Medicare Other | Admitting: Adult Health

## 2021-01-03 DIAGNOSIS — I2510 Atherosclerotic heart disease of native coronary artery without angina pectoris: Secondary | ICD-10-CM | POA: Diagnosis present

## 2021-01-03 DIAGNOSIS — I4891 Unspecified atrial fibrillation: Secondary | ICD-10-CM | POA: Diagnosis present

## 2021-01-03 DIAGNOSIS — D693 Immune thrombocytopenic purpura: Secondary | ICD-10-CM | POA: Diagnosis present

## 2021-01-03 DIAGNOSIS — W19XXXA Unspecified fall, initial encounter: Secondary | ICD-10-CM | POA: Diagnosis present

## 2021-01-03 DIAGNOSIS — Z515 Encounter for palliative care: Principal | ICD-10-CM

## 2021-01-03 DIAGNOSIS — J189 Pneumonia, unspecified organism: Secondary | ICD-10-CM | POA: Diagnosis present

## 2021-01-03 DIAGNOSIS — N179 Acute kidney failure, unspecified: Secondary | ICD-10-CM | POA: Diagnosis present

## 2021-01-03 DIAGNOSIS — Z951 Presence of aortocoronary bypass graft: Secondary | ICD-10-CM

## 2021-01-03 DIAGNOSIS — I447 Left bundle-branch block, unspecified: Secondary | ICD-10-CM | POA: Diagnosis present

## 2021-01-03 DIAGNOSIS — Z9981 Dependence on supplemental oxygen: Secondary | ICD-10-CM

## 2021-01-03 DIAGNOSIS — I5042 Chronic combined systolic (congestive) and diastolic (congestive) heart failure: Secondary | ICD-10-CM | POA: Diagnosis present

## 2021-01-03 DIAGNOSIS — Z7982 Long term (current) use of aspirin: Secondary | ICD-10-CM

## 2021-01-03 DIAGNOSIS — J1282 Pneumonia due to coronavirus disease 2019: Secondary | ICD-10-CM | POA: Diagnosis present

## 2021-01-03 DIAGNOSIS — Z79899 Other long term (current) drug therapy: Secondary | ICD-10-CM

## 2021-01-03 DIAGNOSIS — E785 Hyperlipidemia, unspecified: Secondary | ICD-10-CM | POA: Diagnosis present

## 2021-01-03 DIAGNOSIS — N400 Enlarged prostate without lower urinary tract symptoms: Secondary | ICD-10-CM | POA: Diagnosis present

## 2021-01-03 DIAGNOSIS — R0682 Tachypnea, not elsewhere classified: Secondary | ICD-10-CM

## 2021-01-03 DIAGNOSIS — R296 Repeated falls: Secondary | ICD-10-CM | POA: Diagnosis present

## 2021-01-03 DIAGNOSIS — J440 Chronic obstructive pulmonary disease with acute lower respiratory infection: Secondary | ICD-10-CM | POA: Diagnosis present

## 2021-01-03 DIAGNOSIS — R54 Age-related physical debility: Secondary | ICD-10-CM | POA: Diagnosis present

## 2021-01-03 DIAGNOSIS — Z96641 Presence of right artificial hip joint: Secondary | ICD-10-CM | POA: Diagnosis present

## 2021-01-03 DIAGNOSIS — Z87891 Personal history of nicotine dependence: Secondary | ICD-10-CM

## 2021-01-03 DIAGNOSIS — E1140 Type 2 diabetes mellitus with diabetic neuropathy, unspecified: Secondary | ICD-10-CM | POA: Diagnosis present

## 2021-01-03 DIAGNOSIS — Z794 Long term (current) use of insulin: Secondary | ICD-10-CM

## 2021-01-03 DIAGNOSIS — K219 Gastro-esophageal reflux disease without esophagitis: Secondary | ICD-10-CM | POA: Diagnosis present

## 2021-01-03 DIAGNOSIS — Z789 Other specified health status: Secondary | ICD-10-CM

## 2021-01-03 DIAGNOSIS — S0083XA Contusion of other part of head, initial encounter: Secondary | ICD-10-CM | POA: Diagnosis present

## 2021-01-03 DIAGNOSIS — Z66 Do not resuscitate: Secondary | ICD-10-CM

## 2021-01-03 DIAGNOSIS — E1122 Type 2 diabetes mellitus with diabetic chronic kidney disease: Secondary | ICD-10-CM | POA: Diagnosis present

## 2021-01-03 DIAGNOSIS — Z8616 Personal history of COVID-19: Secondary | ICD-10-CM

## 2021-01-03 DIAGNOSIS — I130 Hypertensive heart and chronic kidney disease with heart failure and stage 1 through stage 4 chronic kidney disease, or unspecified chronic kidney disease: Secondary | ICD-10-CM | POA: Diagnosis present

## 2021-01-03 DIAGNOSIS — W1830XA Fall on same level, unspecified, initial encounter: Secondary | ICD-10-CM | POA: Diagnosis present

## 2021-01-03 DIAGNOSIS — Y92129 Unspecified place in nursing home as the place of occurrence of the external cause: Secondary | ICD-10-CM

## 2021-01-03 DIAGNOSIS — J449 Chronic obstructive pulmonary disease, unspecified: Secondary | ICD-10-CM | POA: Diagnosis present

## 2021-01-03 DIAGNOSIS — N1832 Chronic kidney disease, stage 3b: Secondary | ICD-10-CM | POA: Diagnosis present

## 2021-01-03 DIAGNOSIS — Z85828 Personal history of other malignant neoplasm of skin: Secondary | ICD-10-CM

## 2021-01-03 MED ORDER — DOXYCYCLINE MONOHYDRATE 100 MG PO CAPS: 100.0000 mg | ORAL_CAPSULE | Freq: Two times a day (BID) | ORAL | 0 refills | Status: AC

## 2021-01-03 MED ORDER — SACCHAROMYCES BOULARDII 250 MG PO CAPS: 250.0000 mg | ORAL_CAPSULE | Freq: Two times a day (BID) | ORAL | 0 refills | Status: AC

## 2021-01-03 NOTE — Progress Notes (Deleted)
Location:   [place] Room Number: [place] of Service:  SNF (31) Provider: Durenda Age ,NP   Wenda Low, MD  Patient Care Team: Wenda Low, MD as PCP - General (Internal Medicine) Minus Breeding, MD as PCP - Cardiology (Cardiology) Rico Sheehan (Hematology and Oncology)  Extended Emergency Contact Information Primary Emergency Contact: Savini,[name] Address: [address]          [place], [place] [address] [place] Relation: Son Secondary Emergency Contact: Darius Bump [place] Phone: [phone] Mobile Phone: [phone] Relation: Son  Code Status:  DNR Goals of care: Advanced Directive information Advanced Directives 01/05/2021  Does Patient Have a Medical Advance Directive? Yes  Type of Advance Directive Out of facility DNR (pink MOST or yellow form)  Does patient want to make changes to medical advance directive? No - Patient declined  Copy of [place] in Chart? -  Would patient like information on creating a medical advance directive? -  Pre-existing out of facility DNR order (yellow form or pink MOST form) Yellow form placed in chart (order not valid for inpatient use)     Chief Complaint  Patient presents with   Acute Visit    Low blood pressure   Health Maintenance    Foot exam,eye exam, Flu vaccine, 3rd COVID booster    HPI:  Pt is a 87 y.o. male seen today for an acute visit for    Past Medical History:  Diagnosis Date   Basal cell carcinoma 10/23/2017   LEFT TEMPLE CX3 5FU   BPH (benign prostatic hypertrophy)    Chronic heart failure ([place])    Chronic renal insufficiency    Coronary artery disease    CABG in 2000   Diabetes type 2, controlled ([place])    Diet-controlled   Diabetic neuropathy ([place])    Dyslipidemia    Hypertension    ITP (idiopathic thrombocytopenic purpura)    Osteoarthritis    Pneumonia    SCC (squamous cell  carcinoma) 10/23/2017   RIGHT SIDEBURN TX=CX3 5FU CAUTERY   SCC (squamous cell carcinoma) 04/23/2018   RIGHT JAWLINE TX=EXC   Squamous cell carcinoma of skin 08/17/2015   LEFT HAND CX3 5FU CAUTERY   Past Surgical History:  Procedure Laterality Date   CHOLECYSTECTOMY     CORONARY ARTERY BYPASS GRAFT  2000   Right hip replacement  2014   TONSILLECTOMY      Allergies  Allergen Reactions   Adenosine     Other reaction(s): Heart block   Alfuzosin Hcl Er Other (See Comments)    Confusion/Uroxatral    Ciprofloxacin Nausea Only    Other reaction(s): Nausea, Dizziness, Blurring of visual image, Nausea, Dizziness, Blurring of visual image   Imdur [Isosorbide Dinitrate] Other (See Comments)    Unknown reaction   Lisinopril     Other reaction(s): COUGHING   Niacin     Other reaction(s): Muscle pain   Penicillins Hives and Other (See Comments)    Has patient had a PCN reaction causing immediate rash, facial/tongue/throat swelling, SOB or lightheadedness with hypotension: Yes Has patient had a PCN reaction causing severe rash involving mucus membranes or skin necrosis: Unknown Has patient had a PCN reaction that required hospitalization: Unknown Has patient had a PCN reaction occurring within the last 10 years: Unknown If all of the above answers are "NO", then may proceed with Cephalosporin use.    Tape Other (See Comments)    SKIN IS  VERY THIN AND WILL TEAR EASILY   Tuberculin Purified Protein Derivative Hives and Nausea And Vomiting    Other reaction(s): Low blood pressure, Urticaria, Nausea and vomiting    Allergies as of 01/19/2021       Reactions   Adenosine    Other reaction(s): Heart block   Alfuzosin Hcl Er Other (See Comments)   Confusion/Uroxatral   Ciprofloxacin Nausea Only   Other reaction(s): Nausea, Dizziness, Blurring of visual image, Nausea, Dizziness, Blurring of visual image   Imdur [isosorbide Dinitrate] Other (See Comments)   Unknown reaction   Lisinopril     Other reaction(s): COUGHING   Niacin    Other reaction(s): Muscle pain   Penicillins Hives, Other (See Comments)   Has patient had a PCN reaction causing immediate rash, facial/tongue/throat swelling, SOB or lightheadedness with hypotension: Yes Has patient had a PCN reaction causing severe rash involving mucus membranes or skin necrosis: Unknown Has patient had a PCN reaction that required hospitalization: Unknown Has patient had a PCN reaction occurring within the last 10 years: Unknown If all of the above answers are "NO", then may proceed with Cephalosporin use.   Tape Other (See Comments)   SKIN IS VERY THIN AND WILL TEAR EASILY   Tuberculin Purified Protein Derivative Hives, Nausea And Vomiting   Other reaction(s): Low blood pressure, Urticaria, Nausea and vomiting        Medication List        Accurate as of January 03, 2021 11:58 AM. If you have any questions, ask your nurse or doctor.          STOP taking these medications    feeding supplement Liqd Stopped by: Durenda Age, NP       TAKE these medications    acetaminophen 500 MG tablet Commonly known as: TYLENOL Take 500-1,000 mg by mouth every 6 (six) hours as needed for mild pain.   amLODipine 10 MG tablet Commonly known as: NORVASC Take 1 tablet (10 mg total) by mouth daily.   ascorbic acid 500 MG tablet Commonly known as: VITAMIN C Take 1 tablet (500 mg total) by mouth daily.   aspirin EC 81 MG tablet Take 81 mg by mouth 2 (two) times a week. Sunday's and Wednesday's   bisacodyl 10 MG suppository Commonly known as: DULCOLAX as needed. If not relieved by MOM, give 10 mg Bisacodyl suppositiory rectally X 1 dose in 24 hours as needed   cycloSPORINE 0.05 % ophthalmic emulsion Commonly known as: RESTASIS Place 1 drop into both eyes every 12 (twelve) hours.   furosemide 20 MG tablet Commonly known as: LASIX Take 20 mg by mouth daily.   guaiFENesin-dextromethorphan 100-10 MG/5ML  syrup Commonly known as: ROBITUSSIN DM Take 10 mLs by mouth every 4 (four) hours as needed for cough.   hydrOXYzine 25 MG tablet Commonly known as: ATARAX/VISTARIL Take 25 mg by mouth at bedtime.   ipratropium-albuterol 0.5-2.5 (3) MG/3ML Soln Commonly known as: DUONEB Take 3 mLs by nebulization every 2 (two) hours as needed.   MILK OF MAGNESIA PO If no BM in 3 days, give 30 cc Milk of Magnesium p.o. x 1 dose in 24 hours as needed   NON FORMULARY Diet:Mech soft/nectar   NON FORMULARY MAGIC CUP three times daily.   OXYGEN Inhale 2.5-3 L into the lungs continuous.   RA SALINE ENEMA RE Place rectally. If not relieved by Biscodyl suppository, give disposable Saline Enema rectally X 1 dose/24 hrs as needed   VITAMIN B12 PO Take  1 tablet by mouth daily.   zinc sulfate 220 (50 Zn) MG capsule Take 1 capsule (220 mg total) by mouth daily.        Review of Systems  Immunization History  Administered Date(s) Administered   Fluad Quad(high Dose 65+) 11/14/2018   Influenza,inj,Quad PF,6+ Mos 11/19/2016, 11/25/2017   Influenza-Unspecified 04/24/2015   Moderna Sars-Covid-2 Vaccination 03/29/2019, 04/26/2019, 11/25/2019, 09/05/2020   Pneumococcal Conjugate-13 10/31/2011, 05/04/2015   Pneumococcal Polysaccharide-23 05/09/2012, 05/19/2017   Td 12/28/2002   Tdap 08/13/2011, 05/04/2015   Zoster, Live 02/08/2014   Pertinent  Health Maintenance Due  Topic Date Due   FOOT EXAM  Never done   OPHTHALMOLOGY EXAM  Never done   INFLUENZA VACCINE  09/25/2020   Fall Risk 12/26/2020 12/26/2020 12/27/2020 12/27/2020 12/28/2020  Falls in the past year? - - - - -  Was there an injury with Fall? - - - - -  Patient Fall Risk Level High fall risk High fall risk High fall risk High fall risk High fall risk   Functional Status Survey:    Vitals:   01/13/2021 1133  BP: 115/68  Pulse: 64  Resp: 20  Temp: (!) 97 F (36.1 C)  Weight: 161 lb 12.8 oz (73.4 kg)  Height: 5\' 7"  (1.702 m)   Body  mass index is 25.34 kg/m. Physical Exam  Labs reviewed: Recent Labs    12/25/20 0339 12/26/20 0354 12/27/20 0336  NA 141 142 144  K 5.0 5.3* 4.9  CL 112* 114* 111  CO2 24 17* 26  GLUCOSE 181* 177* 188*  BUN 61* 75* 74*  CREATININE 1.50* 1.71* 1.52*  CALCIUM 9.1 9.5 9.3  MG 2.0 2.0 1.9  PHOS 2.6 3.0 3.4   Recent Labs    12/21/20 1456 12/22/20 0039 12-15-2020 0417 12/25/20 0339 12/26/20 0354 12/27/20 0336  AST 22 16  --   --   --  50*  ALT 15 13  --   --   --  77*  ALKPHOS 56 44  --   --   --  53  BILITOT 0.7 0.7  --   --   --  0.7  PROT 7.4 6.7  --   --   --  5.9*  ALBUMIN 3.0* 2.6*   < > 2.4* 2.4* 2.4*   < > = values in this interval not displayed.   Recent Labs    08/09/20 0455 12/21/20 1456 12/21/20 1515 12/22/20 0039 12-15-2020 0417 12/24/20 0952 12/25/20 0339  WBC 5.7 10.8*  --  10.2 12.7* 15.5* 12.1*  NEUTROABS 3.9 8.8*  --  8.4*  --   --   --   HGB 10.8* 12.1*   < > 11.2* 10.1* 9.9* 10.4*  HCT 34.3* 39.5   < > 35.7* 33.1* 31.9* 33.2*  MCV 93.0 96.6  --  93.9 95.4 95.8 94.3  PLT 122* 169  --  151 132* 170 169   < > = values in this interval not displayed.   Lab Results  Component Value Date   TSH 0.324 (L) 03/15/2017   Lab Results  Component Value Date   HGBA1C 6.3 (H) 12/22/2020   Lab Results  Component Value Date   CHOL 92 05/18/2017   HDL 25 (L) 05/18/2017   LDLCALC 47 05/18/2017   TRIG 199 (H) 12/21/2020   CHOLHDL 3.7 05/18/2017    Significant Diagnostic Results in last 30 days:  DG Chest Port 1 View  Result Date: 12/25/2020 CLINICAL DATA:  Dyspnea.  Cough.  History of COPD and COVID. EXAM: PORTABLE CHEST 1 VIEW COMPARISON:  12/23/2020; 12/21/2020; 08/19/2020; 01/09/2019 FINDINGS: Grossly unchanged enlarged cardiac silhouette and mediastinal contours post median sternotomy. Grossly unchanged right basilar heterogeneous airspace opacity. Unchanged bibasilar heterogeneous opacities favored to represent atelectasis. Trace pleural effusions  are not excluded. No pneumothorax. No definite evidence of edema. No acute osseous abnormalities. Degenerative change the right glenohumeral joint is suspected though incompletely evaluated. IMPRESSION: 1. Unchanged right basilar heterogeneous airspace opacity worrisome for infection and/or aspiration. 2. Similar appearing bibasilar atelectasis and suspected trace bilateral effusions without evidence of edema. Electronically Signed   By: Sandi Mariscal M.D.   On: 12/25/2020 07:50   DG Chest Port 1 View  Result Date: 12/23/2020 CLINICAL DATA:  Shortness of breath.  COVID positive. EXAM: PORTABLE CHEST 1 VIEW COMPARISON:  12/21/2020 FINDINGS: The cardiac silhouette, mediastinal and hilar contours are stable. Persistent bilateral infiltrates. No definite pleural effusions or pneumothorax. IMPRESSION: Persistent bilateral infiltrates. Electronically Signed   By: Marijo Sanes M.D.   On: 12/23/2020 14:29   DG Chest Portable 1 View  Result Date: 12/21/2020 CLINICAL DATA:  Shortness of breath, COVID positive EXAM: PORTABLE CHEST 1 VIEW COMPARISON:  Chest radiograph 08/09/2020 FINDINGS: Median sternotomy wires and mediastinal surgical clips are stable. The heart is mildly enlarged, unchanged. The mediastinal contours are stable. Patchy airspace disease is seen in the right mid and lower lung, increased since the prior study from 08/09/2020. The left lung is clear. There is no significant pleural effusion. There is no pneumothorax. There is no acute osseous abnormality. IMPRESSION: Patchy airspace disease in the right lung is increased since 08/09/2020. This may reflect ongoing and worsening infection/inflammation. Recommend follow-up radiographs to resolution or chest CT to exclude underlying neoplasm. Electronically Signed   By: Valetta Mole M.D.   On: 12/21/2020 15:26   DG Swallowing Func-Speech Pathology  Result Date: 12/27/2020 Table formatting from the original result was not included. Objective Swallowing  Evaluation: Type of Study: MBS-Modified Barium Swallow Study  Patient Details Name: Lawrence Warner MRN: [id number] Date of Birth: 1918/10/19 Today's Date: 12/27/2020 Time: SLP Start Time (ACUTE ONLY): 1400 -SLP Stop Time (ACUTE ONLY): 1430 SLP Time Calculation (min) (ACUTE ONLY): 30 min Past Medical History: Past Medical History: Diagnosis Date  Basal cell carcinoma 10/23/2017  LEFT TEMPLE CX3 5FU  BCC (basal cell carcinoma of skin)   BPH (benign prostatic hypertrophy)   Chronic heart failure ([facility])   Chronic renal insufficiency   Coronary artery disease   CABG in 2000  Diabetes type 2, controlled ([place])   Diet-controlled  Diabetic neuropathy ([facility])   Dyslipidemia   Hypertension   ITP (idiopathic thrombocytopenic purpura)   Osteoarthritis   Pneumonia   SCC (squamous cell carcinoma) 10/23/2017  RIGHT SIDEBURN TX=CX3 5FU CAUTERY  SCC (squamous cell carcinoma) 04/23/2018  RIGHT JAWLINE TX=EXC  Squamous cell carcinoma of skin 08/17/2015  LEFT HAND CX3 5FU CAUTERY Past Surgical History: Past Surgical History: Procedure Laterality Date  CHOLECYSTECTOMY    CORONARY ARTERY BYPASS GRAFT  2000  Right hip replacement  2014  TONSILLECTOMY   HPI: Patient is a 87 y.o. male with PMH: COPD/chronic hypoxic RF on 2 to 3 L, diastolic CHF, CAD/CABG, DM-2, ITP, CKD-3B, HTN and debility followed by palliative care at home presenting with shortness of breath, productive cough and fatigue for 10 days, and admitted with acute on chronic respiratory failure with hypoxia felt to be due to COPD exacerbation in the setting of COVID-19 infection and possible community-acquired pneumonia.  COVID-19 test positive.  CXR with patchy airspace disease in the right lung. He has a productive cough.  Subjective: alert but drowsy, seems confused Assessment / Plan / Recommendation CHL IP CLINICAL IMPRESSIONS 12/27/2020 Clinical Impression Pt demonstrates a severe dysphagia, likely chronic in nature given advanced age and signs of degenerative changes in the  cervical spine impacting swallowing. Pt unable to move his neck, states "its frozen" preventing many compensatory strategies and likely also increasing risk of aspiration in suboptimal positioning in hospital bed. Pt has instances of aspiration before and after the swallow with thin, nectar and honey thick liquids given limited epiglottic movement due to decreased hyoid excursion and structural changes of cervical spine. There is mild residue with thin and severe residue with increased viscosity. Pt does have intermittent cough response to aspiration, but cued coughing increases efficacy of airway protection. Pt will not benefit from diet modfication, is recommended to continue regular solids and thin liquids. Cues for increased frequency and force of cough chould be helpful. Risk of aspiration will be ongoing and significant. SLP Visit Diagnosis Dysphagia, pharyngeal phase (R13.13) Attention and concentration deficit following -- Frontal lobe and executive function deficit following -- Impact on safety and function Severe aspiration risk   CHL IP TREATMENT RECOMMENDATION 12/27/2020 Treatment Recommendations Therapy as outlined in treatment plan below   Prognosis 12/22/2020 Prognosis for Safe Diet Advancement Good Barriers to Reach Goals -- Barriers/Prognosis Comment -- CHL IP DIET RECOMMENDATION 12/27/2020 SLP Diet Recommendations Regular solids;Thin liquid Liquid Administration via Cup;Straw Medication Administration Whole meds with puree Compensations Hard cough after swallow Postural Changes Remain semi-upright after after feeds/meals (Comment);Seated upright at 90 degrees   CHL IP OTHER RECOMMENDATIONS 12/27/2020 Recommended Consults -- Oral Care Recommendations Oral care BID Other Recommendations --   CHL IP FOLLOW UP RECOMMENDATIONS 12/27/2020 Follow up Recommendations Skilled Nursing facility   [facility] IP FREQUENCY AND DURATION 12/27/2020 Speech Therapy Frequency (ACUTE ONLY) min 2x/week Treatment Duration 2 weeks       CHL IP ORAL PHASE 12/27/2020 Oral Phase Impaired Oral - Pudding Teaspoon -- Oral - Pudding Cup -- Oral - Honey Teaspoon -- Oral - Honey Cup WFL Oral - Nectar Teaspoon -- Oral - Nectar Cup WFL Oral - Nectar Straw -- Oral - Thin Teaspoon Decreased bolus cohesion Oral - Thin Cup Decreased bolus cohesion Oral - Thin Straw -- Oral - Puree WFL Oral - Mech Soft -- Oral - Regular WFL Oral - Multi-Consistency -- Oral - Pill -- Oral Phase - Comment --  CHL IP PHARYNGEAL PHASE 12/27/2020 Pharyngeal Phase Impaired Pharyngeal- Pudding Teaspoon -- Pharyngeal -- Pharyngeal- Pudding Cup -- Pharyngeal -- Pharyngeal- Honey Teaspoon -- Pharyngeal -- Pharyngeal- Honey Cup Reduced anterior laryngeal mobility;Reduced epiglottic inversion;Penetration/Aspiration before swallow;Penetration/Apiration after swallow;Moderate aspiration;Pharyngeal residue - valleculae;Pharyngeal residue - pyriform Pharyngeal Material enters airway, passes BELOW cords and not ejected out despite cough attempt by patient Pharyngeal- Nectar Teaspoon -- Pharyngeal -- Pharyngeal- Nectar Cup Penetration/Aspiration before swallow;Penetration/Apiration after swallow;Moderate aspiration;Pharyngeal residue - valleculae;Pharyngeal residue - pyriform;Reduced epiglottic inversion;Reduced anterior laryngeal mobility Pharyngeal Material enters airway, passes BELOW cords without attempt by patient to eject out (silent aspiration) Pharyngeal- Nectar Straw -- Pharyngeal -- Pharyngeal- Thin Teaspoon Penetration/Apiration after swallow;Penetration/Aspiration before swallow;Moderate aspiration;Pharyngeal residue - valleculae;Pharyngeal residue - pyriform;Reduced epiglottic inversion;Reduced anterior laryngeal mobility Pharyngeal Material enters airway, passes BELOW cords then ejected out;Material enters airway, passes BELOW cords without attempt by patient to eject out (silent aspiration) Pharyngeal- Thin Cup Penetration/Apiration after swallow;Penetration/Aspiration before  swallow;Moderate aspiration;Pharyngeal residue - valleculae;Pharyngeal residue - pyriform;Reduced  epiglottic inversion;Reduced anterior laryngeal mobility Pharyngeal Material enters airway, passes BELOW cords without attempt by patient to eject out (silent aspiration);Material enters airway, passes BELOW cords then ejected out Pharyngeal- Thin Straw -- Pharyngeal -- Pharyngeal- Puree -- Pharyngeal -- Pharyngeal- Mechanical Soft -- Pharyngeal -- Pharyngeal- Regular -- Pharyngeal -- Pharyngeal- Multi-consistency -- Pharyngeal -- Pharyngeal- Pill -- Pharyngeal -- Pharyngeal Comment --  No flowsheet data found. DeBlois, Katherene Ponto 12/27/2020, 3:09 PM              VAS [place] LOWER EXTREMITY VENOUS (DVT)  Result Date: 12/25/2020  Lower Venous DVT Study Patient Name:  Lawrence Warner  Date of Exam:   12/24/2020 Medical Rec #: [id number]         Accession #:    [id number] Date of Birth: Feb 06, 1919        Patient Gender: M Patient Age:   58 years Exam Location:  [facility] Procedure:      VAS [place] LOWER EXTREMITY VENOUS (DVT) Referring Phys: Bretta Bang GONFA --------------------------------------------------------------------------------  Indications: Covid, elevated D-dimer.  Comparison Study: Prior bilateral lower extremity venous duplex done 05/19/2017 Performing Technologist: Sharion Dove RVS  Examination Guidelines: A complete evaluation includes B-mode imaging, spectral Doppler, color Doppler, and power Doppler as needed of all accessible portions of each vessel. Bilateral testing is considered an integral part of a complete examination. Limited examinations for reoccurring indications may be performed as noted. The reflux portion of the exam is performed with the patient in reverse Trendelenburg.  +---------+---------------+---------+-----------+----------+------------------+ RIGHT    CompressibilityPhasicitySpontaneityPropertiesThrombus Aging      +---------+---------------+---------+-----------+----------+------------------+ CFV      Full                                         pulsatile waveform +---------+---------------+---------+-----------+----------+------------------+ SFJ      Full                                                            +---------+---------------+---------+-----------+----------+------------------+ FV Prox  Full                                                            +---------+---------------+---------+-----------+----------+------------------+ FV Mid   Full                                                            +---------+---------------+---------+-----------+----------+------------------+ FV DistalFull                                                            +---------+---------------+---------+-----------+----------+------------------+ PFV      Full                                                            +---------+---------------+---------+-----------+----------+------------------+  POP      Full                                         pulsatile waveform +---------+---------------+---------+-----------+----------+------------------+ PTV      Full                                                            +---------+---------------+---------+-----------+----------+------------------+ PERO     Full                                                            +---------+---------------+---------+-----------+----------+------------------+   +---------+---------------+---------+-----------+----------+------------------+ LEFT     CompressibilityPhasicitySpontaneityPropertiesThrombus Aging     +---------+---------------+---------+-----------+----------+------------------+ CFV      Full                                         pulsatile waveform +---------+---------------+---------+-----------+----------+------------------+ SFJ      Full                                                             +---------+---------------+---------+-----------+----------+------------------+ FV Prox  Full                                                            +---------+---------------+---------+-----------+----------+------------------+ FV Mid   Full                                                            +---------+---------------+---------+-----------+----------+------------------+ FV DistalFull                                                            +---------+---------------+---------+-----------+----------+------------------+ PFV      Full                                                            +---------+---------------+---------+-----------+----------+------------------+ POP      Full  pulsatile waveform +---------+---------------+---------+-----------+----------+------------------+ PTV      Full                                                            +---------+---------------+---------+-----------+----------+------------------+ PERO     Full                                                            +---------+---------------+---------+-----------+----------+------------------+     Summary: BILATERAL: - No evidence of deep vein thrombosis seen in the lower extremities, bilaterally. - RIGHT: pulsatile waveforms suggestive of fluid overload  LEFT: Pulsatile waveforms suggestive of fluid overload.  *See table(s) above for measurements and observations. Electronically signed by Orlie Pollen on 12/25/2020 at 2:37:07 PM.    Final     Assessment/Plan There are no diagnoses linked to this encounter.   Family/ staff Communication:   Labs/tests ordered:

## 2021-01-03 NOTE — Progress Notes (Signed)
Location:  [place] Room Number: 306 A Place of Service:  SNF (31) Provider:  Durenda Age, DNP, FNP-BC  Patient Care Team: Wenda Low, MD as PCP - General (Internal Medicine) Minus Breeding, MD as PCP - Cardiology (Cardiology) Rico Sheehan (Hematology and Oncology)  Extended Emergency Contact Information Primary Emergency Contact: [place] Phone: [phone] Mobile Phone: [phone] Relation: Relative Secondary Emergency Contact: Darius Bump [place] Phone: [phone] Mobile Phone: [phone] Relation: Son  Code Status:  DNR  Goals of care: Advanced Directive information Advanced Directives 01/23/2021  Does Patient Have a Medical Advance Directive? Yes  Type of Advance Directive Out of facility DNR (pink MOST or yellow form)  Does patient want to make changes to medical advance directive? -  Copy of [place] in Chart? -  Would patient like information on creating a medical advance directive? -  Pre-existing out of facility DNR order (yellow form or pink MOST form) -     Chief Complaint  Patient presents with   Acute Visit    Cough    HPI:  Pt is a 88 y.o. male seen today for  an acute visit . He was reported to have cough. He is reported to spit out thick yellowish mucus. There was no reported fever. He has )2 @ 2L/min continuously with O2 sat 91-93%. Chest x-ray done showed pneumonia in the right lower lobe. He was recently hospitalized 10/27 to 12/28/20 for COVID-19 pneumonia.  Past Medical History:  Diagnosis Date   Basal cell carcinoma 10/23/2017   LEFT TEMPLE CX3 5FU   BPH (benign prostatic hypertrophy)    Chronic heart failure (HCC)    Chronic renal insufficiency    Coronary artery disease    CABG in 2000   Diabetes type 2, controlled ([place])    Diet-controlled   Diabetic neuropathy (HCC)    Dyslipidemia    Hypertension     ITP (idiopathic thrombocytopenic purpura)    Osteoarthritis    Pneumonia    SCC (squamous cell carcinoma) 10/23/2017   RIGHT SIDEBURN TX=CX3 5FU CAUTERY   SCC (squamous cell carcinoma) 04/23/2018   RIGHT JAWLINE TX=EXC   Squamous cell carcinoma of skin 08/17/2015   LEFT HAND CX3 5FU CAUTERY   Past Surgical History:  Procedure Laterality Date   CHOLECYSTECTOMY     CORONARY ARTERY BYPASS GRAFT  2000   Right hip replacement  2014   TONSILLECTOMY      Allergies  Allergen Reactions   Adenosine     Other reaction(s): Heart block   Alfuzosin Hcl Er Other (See Comments)    Confusion/Uroxatral    Ciprofloxacin Nausea Only    Other reaction(s): Nausea, Dizziness, Blurring of visual image, Nausea, Dizziness, Blurring of visual image   Imdur [Isosorbide Dinitrate] Other (See Comments)    Unknown reaction   Lisinopril     Other reaction(s): COUGHING   Niacin     Other reaction(s): Muscle pain   Penicillins Hives and Other (See Comments)    Has patient had a PCN reaction causing immediate rash, facial/tongue/throat swelling, SOB or lightheadedness with hypotension: Yes Has patient had a PCN reaction causing severe rash involving mucus membranes or skin necrosis: Unknown Has patient had a PCN reaction that required hospitalization: Unknown Has patient had a PCN reaction occurring within the last 10 years: Unknown If all of the above answers are "NO", then may proceed with Cephalosporin use.    Tape Other (  See Comments)    SKIN IS VERY THIN AND WILL TEAR EASILY   Tuberculin Purified Protein Derivative Hives and Nausea And Vomiting    Other reaction(s): Low blood pressure, Urticaria, Nausea and vomiting    No facility-administered encounter medications on file as of 01/03/2021.   Outpatient Encounter Medications as of 01/03/2021  Medication Sig   amLODipine (NORVASC) 10 MG tablet Take 1 tablet (10 mg total) by mouth daily.   ascorbic acid (VITAMIN C) 500 MG tablet Take 1 tablet  (500 mg total) by mouth daily.   aspirin EC 81 MG tablet Take 81 mg by mouth 2 (two) times a week. Sunday's and Wednesday's   bisacodyl (DULCOLAX) 10 MG suppository as needed. If not relieved by MOM, give 10 mg Bisacodyl suppositiory rectally X 1 dose in 24 hours as needed   Cyanocobalamin (VITAMIN B12 PO) Take 1,000 mcg by mouth daily.   cycloSPORINE (RESTASIS) 0.05 % ophthalmic emulsion Place 1 drop into both eyes every 12 (twelve) hours.    doxycycline (MONODOX) 100 MG capsule Take 1 capsule (100 mg total) by mouth 2 (two) times daily for 10 days. (Patient not taking: No sig reported)   furosemide (LASIX) 20 MG tablet Take 20 mg by mouth daily.   guaiFENesin-dextromethorphan (ROBITUSSIN DM) 100-10 MG/5ML syrup Take 10 mLs by mouth every 4 (four) hours as needed for cough.   hydrOXYzine (ATARAX/VISTARIL) 25 MG tablet Take 25 mg by mouth See admin instructions. At bedtime as needed for sleep or itching x 2 weeks   ipratropium-albuterol (DUONEB) 0.5-2.5 (3) MG/3ML SOLN Take 3 mLs by nebulization every 2 (two) hours as needed.   Magnesium Hydroxide (MILK OF MAGNESIA PO) If no BM in 3 days, give 30 cc Milk of Magnesium p.o. x 1 dose in 24 hours as needed   OXYGEN Inhale 2 L into the lungs continuous.   saccharomyces boulardii (FLORASTOR) 250 MG capsule Take 1 capsule (250 mg total) by mouth 2 (two) times daily for 13 days.   Sodium Phosphates (RA SALINE ENEMA RE) Place rectally. If not relieved by Biscodyl suppository, give disposable Saline Enema rectally X 1 dose/24 hrs as needed   zinc sulfate 220 (50 Zn) MG capsule Take 1 capsule (220 mg total) by mouth daily.   [DISCONTINUED] acetaminophen (TYLENOL) 500 MG tablet Take 500-1,000 mg by mouth every 6 (six) hours as needed for mild pain.    [DISCONTINUED] NON FORMULARY Diet:Mech soft/nectar   [DISCONTINUED] NON FORMULARY MAGIC CUP three times daily.   [DISCONTINUED] feeding supplement (ENSURE ENLIVE / ENSURE PLUS) LIQD Take 237 mLs by mouth 3  (three) times daily between meals.    Review of Systems  Unable to obtain due to being sleepy.   Immunization History  Administered Date(s) Administered   Fluad Quad(high Dose 65+) 11/14/2018   Influenza,inj,Quad PF,6+ Mos 11/19/2016, 11/25/2017   Influenza-Unspecified 04/24/2015   Moderna Sars-Covid-2 Vaccination 03/29/2019, 04/26/2019, 11/25/2019, 09/05/2020   Pneumococcal Conjugate-13 10/31/2011, 05/04/2015   Pneumococcal Polysaccharide-23 05/09/2012, 05/19/2017   Td 12/28/2002   Tdap 08/13/2011, 05/04/2015   Zoster, Live 02/08/2014   Pertinent  Health Maintenance Due  Topic Date Due   FOOT EXAM  Never done   OPHTHALMOLOGY EXAM  Never done   INFLUENZA VACCINE  09/25/2020   Fall Risk 12/27/2020 12/27/2020 12/28/2020 2021/01/15 01-15-2021  Falls in the past year? - - - - -  Was there an injury with Fall? - - - - -  Patient Fall Risk Level High fall risk High fall risk High  fall risk High fall risk High fall risk     Vitals:   01/03/2021 1133  BP: 115/68  Pulse: 64  Resp: 20  Temp: (!) 97 F (36.1 C)  Weight: 161 lb 12.8 oz (73.4 kg)  Height: 5\' 7"  (1.702 m)   Body mass index is 25.34 kg/m.  Physical Exam  GENERAL APPEARANCE: Well nourished. In no acute distress. Normal body habitus SKIN:  Skin is warm and dry.  MOUTH and THROAT: Lips are without lesions. Oral mucosa is moist and without lesions.  RESPIRATORY: Breathing is even & unlabored CARDIAC: RRR, no murmur,no extra heart sounds, no edema GI: Abdomen soft, normal BS, no masses, no tenderness NEUROLOGICAL: There is no tremor.  PSYCHIATRIC:  Affect and behavior are appropriate  Labs reviewed: Recent Labs    12/25/20 0339 12/26/20 0354 12/27/20 0336 January 24, 2021 0044  NA 141 142 144 140  K 5.0 5.3* 4.9 4.1  CL 112* 114* 111 104  CO2 24 17* 26 26  GLUCOSE 181* 177* 188* 156*  BUN 61* 75* 74* 55*  CREATININE 1.50* 1.71* 1.52* 2.06*  CALCIUM 9.1 9.5 9.3 8.4*  MG 2.0 2.0 1.9  --   PHOS 2.6 3.0 3.4  --     Recent Labs    12/22/20 0039 2020/06/13 0417 12/26/20 0354 12/27/20 0336 01/24/2021 0044  AST 16  --   --  50* 22  ALT 13  --   --  77* 22  ALKPHOS 44  --   --  53 70  BILITOT 0.7  --   --  0.7 0.7  PROT 6.7  --   --  5.9* 5.9*  ALBUMIN 2.6*   < > 2.4* 2.4* 2.2*   < > = values in this interval not displayed.   Recent Labs    12/21/20 1456 12/21/20 1515 12/22/20 0039 2020/06/13 0417 12/24/20 0952 12/25/20 0339 24-Jan-2021 0044  WBC 10.8*  --  10.2   < > 15.5* 12.1* 23.0*  NEUTROABS 8.8*  --  8.4*  --   --   --  21.1*  HGB 12.1*   < > 11.2*   < > 9.9* 10.4* 10.7*  HCT 39.5   < > 35.7*   < > 31.9* 33.2* 34.1*  MCV 96.6  --  93.9   < > 95.8 94.3 94.5  PLT 169  --  151   < > 170 169 143*   < > = values in this interval not displayed.   Lab Results  Component Value Date   TSH 0.324 (L) 03/15/2017   Lab Results  Component Value Date   HGBA1C 6.3 (H) 12/22/2020   Lab Results  Component Value Date   CHOL 92 05/18/2017   HDL 25 (L) 05/18/2017   LDLCALC 47 05/18/2017   TRIG 199 (H) 12/21/2020   CHOLHDL 3.7 05/18/2017    Significant Diagnostic Results in last 30 days:  CT Head Wo Contrast  Result Date: 24-Jan-2021 CLINICAL DATA:  Unwitnessed fall, hematoma to right forehead EXAM: CT HEAD WITHOUT CONTRAST CT MAXILLOFACIAL WITHOUT CONTRAST CT CERVICAL SPINE WITHOUT CONTRAST TECHNIQUE: Multidetector CT imaging of the head, cervical spine, and maxillofacial structures were performed using the standard protocol without intravenous contrast. Multiplanar CT image reconstructions of the cervical spine and maxillofacial structures were also generated. COMPARISON:  CT head dated 09/19/2020. CT head/cervical spine dated 05/17/2017. FINDINGS: CT HEAD FINDINGS Motion degraded images. Brain: No evidence of acute infarction, hemorrhage, hydrocephalus, extra-axial collection or mass lesion/mass effect. Global cortical atrophy.  Subcortical white matter and periventricular small vessel ischemic  changes. Vascular: Intracranial atherosclerosis. Skull: Normal. Negative for fracture or focal lesion. Other: None. CT MAXILLOFACIAL FINDINGS Motion degraded images. Osseous: No evidence of maxillofacial fracture mandible is intact. Bilateral mandibular condyles well-seated in the TMJs Orbits: Bilateral orbits, including the globes and retroconal soft tissues, are within normal limits Sinuses: Partial opacification of the left frontal sinus. Visualized paranasal sinuses and mastoid air cells are otherwise clear. Soft tissues: Negative. CT CERVICAL SPINE FINDINGS Alignment: Exaggerated cervical lordosis. Skull base and vertebrae: No acute fracture. No primary bone lesion or focal pathologic process. Soft tissues and spinal canal: No prevertebral fluid or swelling. No visible canal hematoma. Disc levels: Bridging osteophytosis with moderate multilevel degenerative changes. Calcification of the posterior longitudinal ligament. Spinal canal is patent. Upper chest: Visualized lungs are notable for mild biapical pleural-parenchymal scarring Other: Visualized thyroid is unremarkable. IMPRESSION: Motion degraded images. No evidence of acute intracranial abnormality. Atrophy with small vessel ischemic changes. No evidence of maxillofacial fracture. No evidence of traumatic injury to the cervical spine. Moderate multilevel degenerative changes. Electronically Signed   By: Julian Hy M.D.   On: 01-13-21 03:33   CT Cervical Spine Wo Contrast  Result Date: 13-Jan-2021 CLINICAL DATA:  Unwitnessed fall, hematoma to right forehead EXAM: CT HEAD WITHOUT CONTRAST CT MAXILLOFACIAL WITHOUT CONTRAST CT CERVICAL SPINE WITHOUT CONTRAST TECHNIQUE: Multidetector CT imaging of the head, cervical spine, and maxillofacial structures were performed using the standard protocol without intravenous contrast. Multiplanar CT image reconstructions of the cervical spine and maxillofacial structures were also generated. COMPARISON:  CT  head dated 09/19/2020. CT head/cervical spine dated 05/17/2017. FINDINGS: CT HEAD FINDINGS Motion degraded images. Brain: No evidence of acute infarction, hemorrhage, hydrocephalus, extra-axial collection or mass lesion/mass effect. Global cortical atrophy. Subcortical white matter and periventricular small vessel ischemic changes. Vascular: Intracranial atherosclerosis. Skull: Normal. Negative for fracture or focal lesion. Other: None. CT MAXILLOFACIAL FINDINGS Motion degraded images. Osseous: No evidence of maxillofacial fracture mandible is intact. Bilateral mandibular condyles well-seated in the TMJs Orbits: Bilateral orbits, including the globes and retroconal soft tissues, are within normal limits Sinuses: Partial opacification of the left frontal sinus. Visualized paranasal sinuses and mastoid air cells are otherwise clear. Soft tissues: Negative. CT CERVICAL SPINE FINDINGS Alignment: Exaggerated cervical lordosis. Skull base and vertebrae: No acute fracture. No primary bone lesion or focal pathologic process. Soft tissues and spinal canal: No prevertebral fluid or swelling. No visible canal hematoma. Disc levels: Bridging osteophytosis with moderate multilevel degenerative changes. Calcification of the posterior longitudinal ligament. Spinal canal is patent. Upper chest: Visualized lungs are notable for mild biapical pleural-parenchymal scarring Other: Visualized thyroid is unremarkable. IMPRESSION: Motion degraded images. No evidence of acute intracranial abnormality. Atrophy with small vessel ischemic changes. No evidence of maxillofacial fracture. No evidence of traumatic injury to the cervical spine. Moderate multilevel degenerative changes. Electronically Signed   By: Julian Hy M.D.   On: January 13, 2021 03:33   DG Chest Portable 1 View  Result Date: 01/13/21 CLINICAL DATA:  Fall, COVID EXAM: PORTABLE CHEST 1 VIEW COMPARISON:  12/25/2020 FINDINGS: Multifocal patchy opacities in the bilateral  perihilar regions and lower lobes, compatible with multifocal pneumonia. Possible small left pleural effusion. No pneumothorax. The heart is normal in size. Postsurgical changes related to prior CABG. Median sternotomy. IMPRESSION: Multifocal pneumonia in this patient with known COVID. Possible small left pleural effusion. Electronically Signed   By: Julian Hy M.D.   On: 01/13/21 00:35   DG Chest Port 1  View  Result Date: 12/25/2020 CLINICAL DATA:  Dyspnea.  Cough.  History of COPD and COVID. EXAM: PORTABLE CHEST 1 VIEW COMPARISON:  12/23/2020; 12/21/2020; 08/19/2020; 01/09/2019 FINDINGS: Grossly unchanged enlarged cardiac silhouette and mediastinal contours post median sternotomy. Grossly unchanged right basilar heterogeneous airspace opacity. Unchanged bibasilar heterogeneous opacities favored to represent atelectasis. Trace pleural effusions are not excluded. No pneumothorax. No definite evidence of edema. No acute osseous abnormalities. Degenerative change the right glenohumeral joint is suspected though incompletely evaluated. IMPRESSION: 1. Unchanged right basilar heterogeneous airspace opacity worrisome for infection and/or aspiration. 2. Similar appearing bibasilar atelectasis and suspected trace bilateral effusions without evidence of edema. Electronically Signed   By: Sandi Mariscal M.D.   On: 12/25/2020 07:50   DG Chest Port 1 View  Result Date: 12/23/2020 CLINICAL DATA:  Shortness of breath.  COVID positive. EXAM: PORTABLE CHEST 1 VIEW COMPARISON:  12/21/2020 FINDINGS: The cardiac silhouette, mediastinal and hilar contours are stable. Persistent bilateral infiltrates. No definite pleural effusions or pneumothorax. IMPRESSION: Persistent bilateral infiltrates. Electronically Signed   By: Marijo Sanes M.D.   On: 12/23/2020 14:29   DG Chest Portable 1 View  Result Date: 12/21/2020 CLINICAL DATA:  Shortness of breath, COVID positive EXAM: PORTABLE CHEST 1 VIEW COMPARISON:  Chest  radiograph 08/09/2020 FINDINGS: Median sternotomy wires and mediastinal surgical clips are stable. The heart is mildly enlarged, unchanged. The mediastinal contours are stable. Patchy airspace disease is seen in the right mid and lower lung, increased since the prior study from 08/09/2020. The left lung is clear. There is no significant pleural effusion. There is no pneumothorax. There is no acute osseous abnormality. IMPRESSION: Patchy airspace disease in the right lung is increased since 08/09/2020. This may reflect ongoing and worsening infection/inflammation. Recommend follow-up radiographs to resolution or chest CT to exclude underlying neoplasm. Electronically Signed   By: Valetta Mole M.D.   On: 12/21/2020 15:26   DG Swallowing Func-Speech Pathology  Result Date: 12/27/2020 Table formatting from the original result was not included. Objective Swallowing Evaluation: Type of Study: MBS-Modified Barium Swallow Study  Patient Details Name: Sajad Glander MRN: [id number] Date of Birth: [birth year] Today's Date: 12/27/2020 Time: SLP Start Time (ACUTE ONLY): 1400 -SLP Stop Time (ACUTE ONLY): 1430 SLP Time Calculation (min) (ACUTE ONLY): 30 min Past Medical History: Past Medical History: Diagnosis Date  Basal cell carcinoma 10/23/2017  LEFT TEMPLE CX3 5FU  BCC (basal cell carcinoma of skin)   BPH (benign prostatic hypertrophy)   Chronic heart failure (HCC)   Chronic renal insufficiency   Coronary artery disease   CABG in 2000  Diabetes type 2, controlled ([place])   Diet-controlled  Diabetic neuropathy (HCC)   Dyslipidemia   Hypertension   ITP (idiopathic thrombocytopenic purpura)   Osteoarthritis   Pneumonia   SCC (squamous cell carcinoma) 10/23/2017  RIGHT SIDEBURN TX=CX3 5FU CAUTERY  SCC (squamous cell carcinoma) 04/23/2018  RIGHT JAWLINE TX=EXC  Squamous cell carcinoma of skin 08/17/2015  LEFT HAND CX3 5FU CAUTERY Past Surgical History: Past Surgical History: Procedure Laterality Date  CHOLECYSTECTOMY    CORONARY  ARTERY BYPASS GRAFT  2000  Right hip replacement  2014  TONSILLECTOMY   HPI: Patient is a 87 y.o. male with PMH: COPD/chronic hypoxic RF on 2 to 3 L, diastolic CHF, CAD/CABG, DM-2, ITP, CKD-3B, HTN and debility followed by palliative care at home presenting with shortness of breath, productive cough and fatigue for 10 days, and admitted with acute on chronic respiratory failure with hypoxia felt to be due to COPD  exacerbation in the setting of COVID-19 infection and possible community-acquired pneumonia.  COVID-19 test positive.  CXR with patchy airspace disease in the right lung. He has a productive cough.  Subjective: alert but drowsy, seems confused Assessment / Plan / Recommendation CHL IP CLINICAL IMPRESSIONS 12/27/2020 Clinical Impression Pt demonstrates a severe dysphagia, likely chronic in nature given advanced age and signs of degenerative changes in the cervical spine impacting swallowing. Pt unable to move his neck, states "its frozen" preventing many compensatory strategies and likely also increasing risk of aspiration in suboptimal positioning in hospital bed. Pt has instances of aspiration before and after the swallow with thin, nectar and honey thick liquids given limited epiglottic movement due to decreased hyoid excursion and structural changes of cervical spine. There is mild residue with thin and severe residue with increased viscosity. Pt does have intermittent cough response to aspiration, but cued coughing increases efficacy of airway protection. Pt will not benefit from diet modfication, is recommended to continue regular solids and thin liquids. Cues for increased frequency and force of cough chould be helpful. Risk of aspiration will be ongoing and significant. SLP Visit Diagnosis Dysphagia, pharyngeal phase (R13.13) Attention and concentration deficit following -- Frontal lobe and executive function deficit following -- Impact on safety and function Severe aspiration risk   CHL IP TREATMENT  RECOMMENDATION 12/27/2020 Treatment Recommendations Therapy as outlined in treatment plan below   Prognosis 12/22/2020 Prognosis for Safe Diet Advancement Good Barriers to Reach Goals -- Barriers/Prognosis Comment -- CHL IP DIET RECOMMENDATION 12/27/2020 SLP Diet Recommendations Regular solids;Thin liquid Liquid Administration via Cup;Straw Medication Administration Whole meds with puree Compensations Hard cough after swallow Postural Changes Remain semi-upright after after feeds/meals (Comment);Seated upright at 90 degrees   CHL IP OTHER RECOMMENDATIONS 12/27/2020 Recommended Consults -- Oral Care Recommendations Oral care BID Other Recommendations --   CHL IP FOLLOW UP RECOMMENDATIONS 12/27/2020 Follow up Recommendations Skilled Nursing facility   [facility] IP FREQUENCY AND DURATION 12/27/2020 Speech Therapy Frequency (ACUTE ONLY) min 2x/week Treatment Duration 2 weeks      CHL IP ORAL PHASE 12/27/2020 Oral Phase Impaired Oral - Pudding Teaspoon -- Oral - Pudding Cup -- Oral - Honey Teaspoon -- Oral - Honey Cup WFL Oral - Nectar Teaspoon -- Oral - Nectar Cup WFL Oral - Nectar Straw -- Oral - Thin Teaspoon Decreased bolus cohesion Oral - Thin Cup Decreased bolus cohesion Oral - Thin Straw -- Oral - Puree WFL Oral - Mech Soft -- Oral - Regular WFL Oral - Multi-Consistency -- Oral - Pill -- Oral Phase - Comment --  CHL IP PHARYNGEAL PHASE 12/27/2020 Pharyngeal Phase Impaired Pharyngeal- Pudding Teaspoon -- Pharyngeal -- Pharyngeal- Pudding Cup -- Pharyngeal -- Pharyngeal- Honey Teaspoon -- Pharyngeal -- Pharyngeal- Honey Cup Reduced anterior laryngeal mobility;Reduced epiglottic inversion;Penetration/Aspiration before swallow;Penetration/Apiration after swallow;Moderate aspiration;Pharyngeal residue - valleculae;Pharyngeal residue - pyriform Pharyngeal Material enters airway, passes BELOW cords and not ejected out despite cough attempt by patient Pharyngeal- Nectar Teaspoon -- Pharyngeal -- Pharyngeal- Nectar Cup  Penetration/Aspiration before swallow;Penetration/Apiration after swallow;Moderate aspiration;Pharyngeal residue - valleculae;Pharyngeal residue - pyriform;Reduced epiglottic inversion;Reduced anterior laryngeal mobility Pharyngeal Material enters airway, passes BELOW cords without attempt by patient to eject out (silent aspiration) Pharyngeal- Nectar Straw -- Pharyngeal -- Pharyngeal- Thin Teaspoon Penetration/Apiration after swallow;Penetration/Aspiration before swallow;Moderate aspiration;Pharyngeal residue - valleculae;Pharyngeal residue - pyriform;Reduced epiglottic inversion;Reduced anterior laryngeal mobility Pharyngeal Material enters airway, passes BELOW cords then ejected out;Material enters airway, passes BELOW cords without attempt by patient to eject out (silent aspiration) Pharyngeal- Thin Cup Penetration/Apiration  after swallow;Penetration/Aspiration before swallow;Moderate aspiration;Pharyngeal residue - valleculae;Pharyngeal residue - pyriform;Reduced epiglottic inversion;Reduced anterior laryngeal mobility Pharyngeal Material enters airway, passes BELOW cords without attempt by patient to eject out (silent aspiration);Material enters airway, passes BELOW cords then ejected out Pharyngeal- Thin Straw -- Pharyngeal -- Pharyngeal- Puree -- Pharyngeal -- Pharyngeal- Mechanical Soft -- Pharyngeal -- Pharyngeal- Regular -- Pharyngeal -- Pharyngeal- Multi-consistency -- Pharyngeal -- Pharyngeal- Pill -- Pharyngeal -- Pharyngeal Comment --  No flowsheet data found. DeBlois, Katherene Ponto 12/27/2020, 3:09 PM              VAS [place] LOWER EXTREMITY VENOUS (DVT)  Result Date: 12/25/2020  Lower Venous DVT Study Patient Name:  SHAMEEK NYQUIST  Date of Exam:   12/24/2020 Medical Rec #: [id number]         Accession #:    [id number] Date of Birth: 10/09/1918        Patient Gender: M Patient Age:   26 years Exam Location:  [facility] Procedure:      VAS [place] LOWER EXTREMITY VENOUS (DVT) Referring Phys:  Bretta Bang GONFA --------------------------------------------------------------------------------  Indications: Covid, elevated D-dimer.  Comparison Study: Prior bilateral lower extremity venous duplex done 05/19/2017 Performing Technologist: Sharion Dove RVS  Examination Guidelines: A complete evaluation includes B-mode imaging, spectral Doppler, color Doppler, and power Doppler as needed of all accessible portions of each vessel. Bilateral testing is considered an integral part of a complete examination. Limited examinations for reoccurring indications may be performed as noted. The reflux portion of the exam is performed with the patient in reverse Trendelenburg.  +---------+---------------+---------+-----------+----------+------------------+ RIGHT    CompressibilityPhasicitySpontaneityPropertiesThrombus Aging     +---------+---------------+---------+-----------+----------+------------------+ CFV      Full                                         pulsatile waveform +---------+---------------+---------+-----------+----------+------------------+ SFJ      Full                                                            +---------+---------------+---------+-----------+----------+------------------+ FV Prox  Full                                                            +---------+---------------+---------+-----------+----------+------------------+ FV Mid   Full                                                            +---------+---------------+---------+-----------+----------+------------------+ FV DistalFull                                                            +---------+---------------+---------+-----------+----------+------------------+ PFV      Full                                                            +---------+---------------+---------+-----------+----------+------------------+  POP      Full                                         pulsatile  waveform +---------+---------------+---------+-----------+----------+------------------+ PTV      Full                                                            +---------+---------------+---------+-----------+----------+------------------+ PERO     Full                                                            +---------+---------------+---------+-----------+----------+------------------+   +---------+---------------+---------+-----------+----------+------------------+ LEFT     CompressibilityPhasicitySpontaneityPropertiesThrombus Aging     +---------+---------------+---------+-----------+----------+------------------+ CFV      Full                                         pulsatile waveform +---------+---------------+---------+-----------+----------+------------------+ SFJ      Full                                                            +---------+---------------+---------+-----------+----------+------------------+ FV Prox  Full                                                            +---------+---------------+---------+-----------+----------+------------------+ FV Mid   Full                                                            +---------+---------------+---------+-----------+----------+------------------+ FV DistalFull                                                            +---------+---------------+---------+-----------+----------+------------------+ PFV      Full                                                            +---------+---------------+---------+-----------+----------+------------------+ POP      Full  pulsatile waveform +---------+---------------+---------+-----------+----------+------------------+ PTV      Full                                                            +---------+---------------+---------+-----------+----------+------------------+ PERO     Full                                                             +---------+---------------+---------+-----------+----------+------------------+     Summary: BILATERAL: - No evidence of deep vein thrombosis seen in the lower extremities, bilaterally. - RIGHT: pulsatile waveforms suggestive of fluid overload  LEFT: Pulsatile waveforms suggestive of fluid overload.  *See table(s) above for measurements and observations. Electronically signed by Orlie Pollen on 12/25/2020 at 2:37:07 PM.    Final    CT Maxillofacial Wo Contrast  Result Date: 01/21/21 CLINICAL DATA:  Unwitnessed fall, hematoma to right forehead EXAM: CT HEAD WITHOUT CONTRAST CT MAXILLOFACIAL WITHOUT CONTRAST CT CERVICAL SPINE WITHOUT CONTRAST TECHNIQUE: Multidetector CT imaging of the head, cervical spine, and maxillofacial structures were performed using the standard protocol without intravenous contrast. Multiplanar CT image reconstructions of the cervical spine and maxillofacial structures were also generated. COMPARISON:  CT head dated 09/19/2020. CT head/cervical spine dated 05/17/2017. FINDINGS: CT HEAD FINDINGS Motion degraded images. Brain: No evidence of acute infarction, hemorrhage, hydrocephalus, extra-axial collection or mass lesion/mass effect. Global cortical atrophy. Subcortical white matter and periventricular small vessel ischemic changes. Vascular: Intracranial atherosclerosis. Skull: Normal. Negative for fracture or focal lesion. Other: None. CT MAXILLOFACIAL FINDINGS Motion degraded images. Osseous: No evidence of maxillofacial fracture mandible is intact. Bilateral mandibular condyles well-seated in the TMJs Orbits: Bilateral orbits, including the globes and retroconal soft tissues, are within normal limits Sinuses: Partial opacification of the left frontal sinus. Visualized paranasal sinuses and mastoid air cells are otherwise clear. Soft tissues: Negative. CT CERVICAL SPINE FINDINGS Alignment: Exaggerated cervical lordosis.  Skull base and vertebrae: No acute fracture. No primary bone lesion or focal pathologic process. Soft tissues and spinal canal: No prevertebral fluid or swelling. No visible canal hematoma. Disc levels: Bridging osteophytosis with moderate multilevel degenerative changes. Calcification of the posterior longitudinal ligament. Spinal canal is patent. Upper chest: Visualized lungs are notable for mild biapical pleural-parenchymal scarring Other: Visualized thyroid is unremarkable. IMPRESSION: Motion degraded images. No evidence of acute intracranial abnormality. Atrophy with small vessel ischemic changes. No evidence of maxillofacial fracture. No evidence of traumatic injury to the cervical spine. Moderate multilevel degenerative changes. Electronically Signed   By: Julian Hy M.D.   On: 01/21/2021 03:33    Assessment/Plan  1. Pneumonia of right lower lobe due to infectious organism - doxycycline (MONODOX) 100 MG capsule; Take 1 capsule (100 mg total) by mouth 2 (two) times daily for 10 days. (Patient not taking: No sig reported)  Dispense: 20 capsule; Refill: 0 - saccharomyces boulardii (FLORASTOR) 250 MG capsule; Take 1 capsule (250 mg total) by mouth 2 (two) times daily for 13 days.  Dispense: 26 capsule; Refill: 0 -  Guaifenesin 100 mg/5 ml give 10 ml PO at 6AM, 2 PM and 10 PM X 2 weeks   Family/ staff Communication:  Discussed plan of care with charge nurse.  Labs/tests ordered:  CBC and BMP  Goals of care:  Short-term care    Durenda Age, DNP, MSN, FNP-BC [facility] [phone] (Monday-Friday 8:00 a.m. - 5:00 p.m.) [phone] (after hours)

## 2021-01-04 ENCOUNTER — Encounter (HOSPITAL_COMMUNITY): Payer: Self-pay | Admitting: *Deleted

## 2021-01-04 ENCOUNTER — Emergency Department (HOSPITAL_COMMUNITY): Payer: Medicare Other

## 2021-01-04 ENCOUNTER — Other Ambulatory Visit: Payer: Self-pay

## 2021-01-04 DIAGNOSIS — Z96641 Presence of right artificial hip joint: Secondary | ICD-10-CM | POA: Diagnosis present

## 2021-01-04 DIAGNOSIS — W19XXXA Unspecified fall, initial encounter: Secondary | ICD-10-CM | POA: Diagnosis present

## 2021-01-04 DIAGNOSIS — R296 Repeated falls: Secondary | ICD-10-CM | POA: Diagnosis present

## 2021-01-04 DIAGNOSIS — Z87891 Personal history of nicotine dependence: Secondary | ICD-10-CM | POA: Diagnosis not present

## 2021-01-04 DIAGNOSIS — Z85828 Personal history of other malignant neoplasm of skin: Secondary | ICD-10-CM | POA: Diagnosis not present

## 2021-01-04 DIAGNOSIS — J189 Pneumonia, unspecified organism: Secondary | ICD-10-CM | POA: Diagnosis present

## 2021-01-04 DIAGNOSIS — Z789 Other specified health status: Secondary | ICD-10-CM

## 2021-01-04 DIAGNOSIS — Z66 Do not resuscitate: Secondary | ICD-10-CM | POA: Diagnosis present

## 2021-01-04 DIAGNOSIS — I2510 Atherosclerotic heart disease of native coronary artery without angina pectoris: Secondary | ICD-10-CM | POA: Diagnosis present

## 2021-01-04 DIAGNOSIS — E1140 Type 2 diabetes mellitus with diabetic neuropathy, unspecified: Secondary | ICD-10-CM | POA: Diagnosis present

## 2021-01-04 DIAGNOSIS — I130 Hypertensive heart and chronic kidney disease with heart failure and stage 1 through stage 4 chronic kidney disease, or unspecified chronic kidney disease: Secondary | ICD-10-CM | POA: Diagnosis present

## 2021-01-04 DIAGNOSIS — E785 Hyperlipidemia, unspecified: Secondary | ICD-10-CM | POA: Diagnosis present

## 2021-01-04 DIAGNOSIS — J1282 Pneumonia due to coronavirus disease 2019: Secondary | ICD-10-CM

## 2021-01-04 DIAGNOSIS — Z794 Long term (current) use of insulin: Secondary | ICD-10-CM | POA: Diagnosis not present

## 2021-01-04 DIAGNOSIS — U071 COVID-19: Secondary | ICD-10-CM | POA: Diagnosis not present

## 2021-01-04 DIAGNOSIS — Z515 Encounter for palliative care: Principal | ICD-10-CM

## 2021-01-04 DIAGNOSIS — N179 Acute kidney failure, unspecified: Secondary | ICD-10-CM | POA: Diagnosis present

## 2021-01-04 DIAGNOSIS — N1832 Chronic kidney disease, stage 3b: Secondary | ICD-10-CM | POA: Diagnosis present

## 2021-01-04 DIAGNOSIS — W1830XA Fall on same level, unspecified, initial encounter: Secondary | ICD-10-CM | POA: Diagnosis present

## 2021-01-04 DIAGNOSIS — Z7982 Long term (current) use of aspirin: Secondary | ICD-10-CM | POA: Diagnosis not present

## 2021-01-04 DIAGNOSIS — D693 Immune thrombocytopenic purpura: Secondary | ICD-10-CM | POA: Diagnosis present

## 2021-01-04 DIAGNOSIS — Z8616 Personal history of COVID-19: Secondary | ICD-10-CM | POA: Diagnosis not present

## 2021-01-04 DIAGNOSIS — Z951 Presence of aortocoronary bypass graft: Secondary | ICD-10-CM | POA: Diagnosis not present

## 2021-01-04 DIAGNOSIS — Z9981 Dependence on supplemental oxygen: Secondary | ICD-10-CM | POA: Diagnosis not present

## 2021-01-04 DIAGNOSIS — R0682 Tachypnea, not elsewhere classified: Secondary | ICD-10-CM

## 2021-01-04 DIAGNOSIS — E1122 Type 2 diabetes mellitus with diabetic chronic kidney disease: Secondary | ICD-10-CM | POA: Diagnosis present

## 2021-01-04 DIAGNOSIS — I5042 Chronic combined systolic (congestive) and diastolic (congestive) heart failure: Secondary | ICD-10-CM | POA: Diagnosis present

## 2021-01-04 DIAGNOSIS — Y92129 Unspecified place in nursing home as the place of occurrence of the external cause: Secondary | ICD-10-CM

## 2021-01-04 DIAGNOSIS — N400 Enlarged prostate without lower urinary tract symptoms: Secondary | ICD-10-CM | POA: Diagnosis present

## 2021-01-04 DIAGNOSIS — J440 Chronic obstructive pulmonary disease with acute lower respiratory infection: Secondary | ICD-10-CM | POA: Diagnosis present

## 2021-01-04 DIAGNOSIS — Z79899 Other long term (current) drug therapy: Secondary | ICD-10-CM | POA: Diagnosis not present

## 2021-01-04 LAB — LACTIC ACID, PLASMA
Lactic Acid, Venous: 2.1 mmol/L (ref 0.5–1.9)
Lactic Acid, Venous: 1.7 mmol/L (ref 0.5–1.9)

## 2021-01-04 LAB — RESP PANEL BY RT-PCR (FLU A&B, COVID) ARPGX2
Influenza A by PCR: NEGATIVE
Influenza B by PCR: NEGATIVE
SARS Coronavirus 2 by RT PCR: POSITIVE — AB

## 2021-01-04 LAB — COMPREHENSIVE METABOLIC PANEL
ALT: 22 U/L (ref 0–44)
AST: 22 U/L (ref 15–41)
Albumin: 2.2 g/dL — ABNORMAL LOW (ref 3.5–5.0)
Alkaline Phosphatase: 70 U/L (ref 38–126)
Anion gap: 10 (ref 5–15)
BUN: 55 mg/dL — ABNORMAL HIGH (ref 8–23)
CO2: 26 mmol/L (ref 22–32)
Calcium: 8.4 mg/dL — ABNORMAL LOW (ref 8.9–10.3)
Chloride: 104 mmol/L (ref 98–111)
Creatinine, Ser: 2.06 mg/dL — ABNORMAL HIGH (ref 0.61–1.24)
GFR, Estimated: 28 mL/min — ABNORMAL LOW (ref >60)
Glucose, Bld: 156 mg/dL — ABNORMAL HIGH (ref 70–99)
Potassium: 4.1 mmol/L (ref 3.5–5.1)
Sodium: 140 mmol/L (ref 135–145)
Total Bilirubin: 0.7 mg/dL (ref 0.3–1.2)
Total Protein: 5.9 g/dL — ABNORMAL LOW (ref 6.5–8.1)

## 2021-01-04 LAB — CBC WITH DIFFERENTIAL/PLATELET
Abs Immature Granulocytes: 0.19 10*3/uL — ABNORMAL HIGH (ref 0.00–0.07)
Basophils Absolute: 0.1 10*3/uL (ref 0.0–0.1)
Basophils Relative: 0 %
Eosinophils Absolute: 0 10*3/uL (ref 0.0–0.5)
Eosinophils Relative: 0 %
HCT: 34.1 % — ABNORMAL LOW (ref 39.0–52.0)
Hemoglobin: 10.7 g/dL — ABNORMAL LOW (ref 13.0–17.0)
Immature Granulocytes: 1 %
Lymphocytes Relative: 1 %
Lymphs Abs: 0.3 10*3/uL — ABNORMAL LOW (ref 0.7–4.0)
MCH: 29.6 pg (ref 26.0–34.0)
MCHC: 31.4 g/dL (ref 30.0–36.0)
MCV: 94.5 fL (ref 80.0–100.0)
Monocytes Absolute: 1.3 10*3/uL — ABNORMAL HIGH (ref 0.1–1.0)
Monocytes Relative: 6 %
Neutro Abs: 21.1 10*3/uL — ABNORMAL HIGH (ref 1.7–7.7)
Neutrophils Relative %: 92 %
Platelets: 143 10*3/uL — ABNORMAL LOW (ref 150–400)
RBC: 3.61 MIL/uL — ABNORMAL LOW (ref 4.22–5.81)
RDW: 12.5 % (ref 11.5–15.5)
WBC: 23 10*3/uL — ABNORMAL HIGH (ref 4.0–10.5)
nRBC: 0 % (ref 0.0–0.2)

## 2021-01-04 MED ORDER — HALOPERIDOL LACTATE 2 MG/ML PO CONC: 2.0000 mg | Freq: Four times a day (QID) | ORAL | Status: DC | PRN

## 2021-01-04 MED ORDER — HALOPERIDOL 0.5 MG PO TABS
0.5000 mg | ORAL_TABLET | ORAL | Status: DC | PRN
  Filled 2021-01-04: qty 1

## 2021-01-04 MED ORDER — GLYCOPYRROLATE 1 MG PO TABS
1.0000 mg | ORAL_TABLET | ORAL | Status: DC | PRN
  Filled 2021-01-04: qty 1

## 2021-01-04 MED ORDER — HALOPERIDOL LACTATE 2 MG/ML PO CONC
0.5000 mg | ORAL | Status: DC | PRN
  Filled 2021-01-04: qty 0.3

## 2021-01-04 MED ORDER — MORPHINE BOLUS VIA INFUSION
2.0000 mg | INTRAVENOUS | Status: DC | PRN
Start: 2021-01-04 — End: 2021-01-05
  Filled 2021-01-04: qty 4

## 2021-01-04 MED ORDER — HALOPERIDOL LACTATE 5 MG/ML IJ SOLN
0.5000 mg | INTRAMUSCULAR | Status: DC | PRN
  Administered 2021-01-04: 0.5 mg via INTRAVENOUS
  Filled 2021-01-04: qty 1

## 2021-01-04 MED ORDER — ONDANSETRON 4 MG PO TBDP: 4.0000 mg | ORAL_TABLET | Freq: Four times a day (QID) | ORAL | Status: DC | PRN

## 2021-01-04 MED ORDER — BIOTENE DRY MOUTH MT LIQD: 15.0000 mL | OROMUCOSAL | Status: DC | PRN

## 2021-01-04 MED ORDER — ACETAMINOPHEN 650 MG RE SUPP: 650.0000 mg | Freq: Four times a day (QID) | RECTAL | Status: DC | PRN

## 2021-01-04 MED ORDER — GLYCOPYRROLATE 0.2 MG/ML IJ SOLN: 0.2000 mg | INTRAMUSCULAR | Status: DC | PRN

## 2021-01-04 MED ORDER — MORPHINE 100MG IN NS 100ML (1MG/ML) PREMIX INFUSION
5.0000 mg/h | INTRAVENOUS | Status: DC
  Administered 2021-01-04: 5 mg/h via INTRAVENOUS
  Filled 2021-01-04: qty 100

## 2021-01-04 MED ORDER — LORAZEPAM 2 MG/ML IJ SOLN: 1.0000 mg | INTRAMUSCULAR | Status: DC | PRN

## 2021-01-04 MED ORDER — ACETAMINOPHEN 650 MG RE SUPP
650.0000 mg | Freq: Once | RECTAL | Status: DC
  Filled 2021-01-04: qty 1

## 2021-01-04 MED ORDER — LORAZEPAM 2 MG/ML PO CONC: 1.0000 mg | ORAL | Status: DC | PRN

## 2021-01-04 MED ORDER — HALOPERIDOL 1 MG PO TABS: 2.0000 mg | ORAL_TABLET | Freq: Four times a day (QID) | ORAL | Status: DC | PRN

## 2021-01-04 MED ORDER — LACTATED RINGERS IV BOLUS
1000.0000 mL | Freq: Once | INTRAVENOUS | Status: AC
  Administered 2021-01-04: 1000 mL via INTRAVENOUS

## 2021-01-04 MED ORDER — HALOPERIDOL LACTATE 5 MG/ML IJ SOLN
2.0000 mg | Freq: Four times a day (QID) | INTRAMUSCULAR | Status: DC | PRN
  Administered 2021-01-04: 2 mg via INTRAVENOUS
  Filled 2021-01-04: qty 1

## 2021-01-04 MED ORDER — GLYCOPYRROLATE 0.2 MG/ML IJ SOLN
0.2000 mg | INTRAMUSCULAR | Status: DC | PRN
  Administered 2021-01-04 (×2): 0.2 mg via INTRAVENOUS
  Filled 2021-01-04 (×2): qty 1

## 2021-01-04 MED ORDER — VANCOMYCIN HCL 1500 MG/300ML IV SOLN
1500.0000 mg | Freq: Once | INTRAVENOUS | Status: AC
  Administered 2021-01-04: 1500 mg via INTRAVENOUS
  Filled 2021-01-04: qty 300

## 2021-01-04 MED ORDER — MORPHINE BOLUS VIA INFUSION
2.0000 mg | INTRAVENOUS | Status: DC | PRN
  Filled 2021-01-04: qty 2

## 2021-01-04 MED ORDER — LACTATED RINGERS IV BOLUS
500.0000 mL | Freq: Once | INTRAVENOUS | Status: AC
  Administered 2021-01-04: 500 mL via INTRAVENOUS

## 2021-01-04 MED ORDER — BIOTENE DRY MOUTH MT LIQD: 15.0000 mL | Freq: Two times a day (BID) | OROMUCOSAL | Status: DC

## 2021-01-04 MED ORDER — LORAZEPAM 1 MG PO TABS: 1.0000 mg | ORAL_TABLET | ORAL | Status: DC | PRN

## 2021-01-04 MED ORDER — DIPHENHYDRAMINE HCL 50 MG/ML IJ SOLN: 12.5000 mg | INTRAMUSCULAR | Status: DC | PRN

## 2021-01-04 MED ORDER — ALBUTEROL SULFATE HFA 108 (90 BASE) MCG/ACT IN AERS
2.0000 | INHALATION_SPRAY | Freq: Once | RESPIRATORY_TRACT | Status: AC
  Administered 2021-01-04: 2 via RESPIRATORY_TRACT
  Filled 2021-01-04: qty 6.7

## 2021-01-04 MED ORDER — SODIUM CHLORIDE 0.9 % IV SOLN
2.0000 g | Freq: Once | INTRAVENOUS | Status: AC
  Administered 2021-01-04: 2 g via INTRAVENOUS
  Filled 2021-01-04: qty 2

## 2021-01-04 MED ORDER — ONDANSETRON HCL 4 MG/2ML IJ SOLN: 4.0000 mg | Freq: Four times a day (QID) | INTRAMUSCULAR | Status: DC | PRN

## 2021-01-04 MED ORDER — POLYVINYL ALCOHOL 1.4 % OP SOLN
1.0000 [drp] | Freq: Four times a day (QID) | OPHTHALMIC | Status: DC | PRN
  Filled 2021-01-04: qty 15

## 2021-01-04 MED ORDER — ACETAMINOPHEN 325 MG PO TABS: 650.0000 mg | ORAL_TABLET | Freq: Four times a day (QID) | ORAL | Status: DC | PRN

## 2021-01-09 LAB — CULTURE, BLOOD (ROUTINE X 2)
Culture: NO GROWTH
Culture: NO GROWTH
Special Requests: ADEQUATE
Special Requests: ADEQUATE

## 2021-01-25 NOTE — Progress Notes (Signed)
    OVERNIGHT PROGRESS REPORT  Notified by RN that patient has expired at 2020  Patient was comfort care with Palliative Consult.  2 RN verified.  Family was not immediately available to RN.     Gershon Cull MSNA ACNPC-AG Acute Care Nurse Practitioner [place]

## 2021-01-25 NOTE — Progress Notes (Signed)
Lawrence Warner from the medical examiner office called back and after a lengthy conversation she finally decided that the patient's death seems natural to her and it is not a medical examiner case. Patient placement is notified. We continue to monitor.

## 2021-01-25 NOTE — ED Notes (Signed)
Lab called with critical value: lactic 2.1. MD notified. Waiting for further orders.

## 2021-01-25 NOTE — Consult Note (Signed)
Consultation Note Date: 01-Feb-2021   Patient Name: Lawrence Warner  DOB: 06/21/1918  MRN: [id number]  Age / Sex: 87 y.o., male  PCP: Wenda Low, MD Referring Physician: Karmen Bongo, MD  Reason for Consultation: Terminal Care  HPI/Patient Profile: 87 y.o. male  with past medical history of BCC, SCC, BPH, CAD s/p CABG, DM, HTN, ITP, stage 3b CKD, COPD on 2-3L home O2, and HLD presented to ED on 02-01-21 from [facility] facility after sustaining unwitnessed fall. He was previously hospitalized from 10/27-11/3/22 with a COPD exacerbation and COVID infection. After discussions with admitting physician, family have opted for transition to full comfort care. Patient was admitted on 01/02/2021 for terminal care.   Clinical Assessment and Goals of Care: I have reviewed medical records including EPIC notes, labs, and imaging. Received report from primary RN - no acute concerns.    Went to visit patient at bedside - no family/visitors present. Patient was lying in bed asleep - I did not attempt to wake him. No signs or non-verbal gestures of pain or discomfort noted. No respiratory distress, increased work of breathing, or secretions noted. Morphine drip infusing at 69ml/hr.   11:57 AM Attempted to call son/Lawrence to discuss diagnosis, prognosis, GOC, EOL wishes, disposition, and options - no answer - confidential voicemail left and PMT phone number provided with request to return call.  Primary Decision Maker: NEXT OF KIN - [name] listed as Emergency Contact but was unable to speak with him today    SUMMARY OF RECOMMENDATIONS   Continue full comfort measures Continue DNR/DNI as previously documented Patient does seem to be stable for transfer to residential hospice today; however, was unable to reach family to discuss transfer. [facility] takes COVID+ patient's. Patient's  window for stable transfer is likely closing soon - if can't contact family today, anticipate hospital death. Added orders for EOL symptom management and to reflect full comfort measures, as well as discontinued orders that were not focused on comfort Unrestricted visitation orders were placed per current [facility] EOL visitation policy  Nursing to provide frequent assessments and administer PRN medications as clinically necessary to ensure EOL comfort PMT will continue to follow and support holistically   Code Status/Advance Care Planning: DNR  Palliative Prophylaxis:  Aspiration, Bowel Regimen, Delirium Protocol, Frequent Pain Assessment, Oral Care, and Turn Reposition  Additional Recommendations (Limitations, Scope, Preferences): Full Comfort Care  Psycho-social/Spiritual:  Created space and opportunity for patient to express thoughts and feelings regarding patient's current medical situation.  Emotional support and therapeutic listening provided.  Prognosis:  Hours - Days  Discharge Planning: hospice facility vs hospital death     Primary Diagnoses: Present on Admission:  Fall at nursing home, initial encounter  Pneumonia due to COVID-19 virus   I have reviewed the medical record, interviewed the patient and family, and examined the patient. The following aspects are pertinent.  Past Medical History:  Diagnosis Date   Basal cell carcinoma 10/23/2017   LEFT TEMPLE CX3 5FU   BPH (benign  prostatic hypertrophy)    Chronic heart failure (HCC)    Chronic renal insufficiency    Coronary artery disease    CABG in 2000   Diabetes type 2, controlled ([place])    Diet-controlled   Diabetic neuropathy (HCC)    Dyslipidemia    Hypertension    ITP (idiopathic thrombocytopenic purpura)    Osteoarthritis    Pneumonia    SCC (squamous cell carcinoma) 10/23/2017   RIGHT SIDEBURN TX=CX3 5FU CAUTERY   SCC (squamous cell carcinoma) 04/23/2018   RIGHT JAWLINE TX=EXC    Squamous cell carcinoma of skin 08/17/2015   LEFT HAND CX3 5FU CAUTERY   Social History   Socioeconomic History   Marital status: Widowed    Spouse name: Not on file   Number of children: Not on file   Years of education: Not on file   Highest education level: Not on file  Occupational History   Occupation: Salesman  Tobacco Use   Smoking status: Former    Packs/day: 2.00    Years: 25.00    Pack years: 50.00    Types: Cigarettes   Smokeless tobacco: Never  Vaping Use   Vaping Use: Never used  Substance and Sexual Activity   Alcohol use: No   Drug use: No   Sexual activity: Not Currently  Other Topics Concern   Not on file  Social History Narrative    Two children.     Social Determinants of Health   Financial Resource Strain: Not on file  Food Insecurity: Not on file  Transportation Needs: Not on file  Physical Activity: Not on file  Stress: Not on file  Social Connections: Not on file   Family History  Problem Relation Age of Onset   Lung disease Father    Cancer Brother    Scheduled Meds: Continuous Infusions:  morphine 5 mg/hr (2021/01/27 1011)   PRN Meds:.acetaminophen **OR** acetaminophen, antiseptic oral rinse, diphenhydrAMINE, glycopyrrolate **OR** glycopyrrolate **OR** glycopyrrolate, haloperidol **OR** haloperidol **OR** haloperidol lactate, LORazepam **OR** LORazepam **OR** LORazepam, morphine, ondansetron **OR** ondansetron (ZOFRAN) IV, polyvinyl alcohol Medications Prior to Admission:  Prior to Admission medications   Medication Sig Start Date End Date Taking? Authorizing Provider  acetaminophen (TYLENOL) 325 MG tablet Take 650 mg by mouth every 6 (six) hours as needed for moderate pain.   Yes [provider]  amLODipine (NORVASC) 10 MG tablet Take 1 tablet (10 mg total) by mouth daily. 12/28/20  Yes Eulogio Bear U, DO  ascorbic acid (VITAMIN C) 500 MG tablet Take 1 tablet (500 mg total) by mouth daily. 12/28/20  Yes Geradine Girt, DO   aspirin EC 81 MG tablet Take 81 mg by mouth 2 (two) times a week. Sunday's and Wednesday's   Yes [provider]  bisacodyl (DULCOLAX) 10 MG suppository as needed. If not relieved by MOM, give 10 mg Bisacodyl suppositiory rectally X 1 dose in 24 hours as needed   Yes [provider]  cefTRIAXone (ROCEPHIN) 1 g injection Inject 1 g into the muscle See admin instructions. Qd x 7 days   Yes [provider]  Cyanocobalamin (VITAMIN B12 PO) Take 1,000 mcg by mouth daily.   Yes [provider]  cycloSPORINE (RESTASIS) 0.05 % ophthalmic emulsion Place 1 drop into both eyes every 12 (twelve) hours.    Yes [provider]  Dextromethorphan-guaiFENesin (TUSSIN DM PO) Take 10 mLs by mouth See admin instructions. Every 6 hours x 2 weeks   Yes [provider]  furosemide (LASIX)  20 MG tablet Take 20 mg by mouth daily.   Yes [provider]  guaiFENesin-dextromethorphan (ROBITUSSIN DM) 100-10 MG/5ML syrup Take 10 mLs by mouth every 4 (four) hours as needed for cough. 12/28/20  Yes Eulogio Bear U, DO  hydrOXYzine (ATARAX/VISTARIL) 25 MG tablet Take 25 mg by mouth See admin instructions. At bedtime as needed for sleep or itching x 2 weeks 06/02/20  Yes [provider]  Insulin Aspart (NOVOLOG FLEXPEN [place]) Inject 5 Units into the skin 2 (two) times daily as needed (cbg greater than or equal to 200).   Yes [provider]  ipratropium-albuterol (DUONEB) 0.5-2.5 (3) MG/3ML SOLN Take 3 mLs by nebulization every 2 (two) hours as needed. 12/28/20  Yes Vann, Jessica U, DO  ipratropium-albuterol (DUONEB) 0.5-2.5 (3) MG/3ML SOLN Take 3 mLs by nebulization See admin instructions. Every 6 hours x 2 weeks   Yes [provider]  Magnesium Hydroxide (MILK OF MAGNESIA PO) If no BM in 3 days, give 30 cc Milk of Magnesium p.o. x 1 dose in 24 hours as needed   Yes [provider]  NON FORMULARY Take 1 Dose by mouth 3 (three) times daily.  Magic cup   Yes [provider]  OXYGEN Inhale 2 L into the lungs continuous.   Yes [provider]  saccharomyces boulardii (FLORASTOR) 250 MG capsule Take 1 capsule (250 mg total) by mouth 2 (two) times daily for 13 days. 01/02/2021 01/16/21 Yes Medina-Vargas, Monina C, NP  Sodium Phosphates (RA SALINE ENEMA RE) Place rectally. If not relieved by Biscodyl suppository, give disposable Saline Enema rectally X 1 dose/24 hrs as needed   Yes [provider]  zinc sulfate 220 (50 Zn) MG capsule Take 1 capsule (220 mg total) by mouth daily. 12/28/20  Yes Eulogio Bear U, DO  doxycycline (MONODOX) 100 MG capsule Take 1 capsule (100 mg total) by mouth 2 (two) times daily for 10 days. Patient not taking: No sig reported 01/07/2021 01/13/21  Medina-Vargas, Monina C, NP  Nutritional Supplements (ENSURE ORIGINAL PO) Take 237 mLs by mouth 3 (three) times daily. Patient not taking: Reported on 01/09/21    [provider]   Allergies  Allergen Reactions   Adenosine     Other reaction(s): Heart block   Alfuzosin Hcl Er Other (See Comments)    Confusion/Uroxatral    Ciprofloxacin Nausea Only    Other reaction(s): Nausea, Dizziness, Blurring of visual image, Nausea, Dizziness, Blurring of visual image   Imdur [Isosorbide Dinitrate] Other (See Comments)    Unknown reaction   Lisinopril     Other reaction(s): COUGHING   Niacin     Other reaction(s): Muscle pain   Penicillins Hives and Other (See Comments)    Has patient had a PCN reaction causing immediate rash, facial/tongue/throat swelling, SOB or lightheadedness with hypotension: Yes Has patient had a PCN reaction causing severe rash involving mucus membranes or skin necrosis: Unknown Has patient had a PCN reaction that required hospitalization: Unknown Has patient had a PCN reaction occurring within the last 10 years: Unknown If all of the above answers are "NO", then may proceed with Cephalosporin use.    Tape Other  (See Comments)    SKIN IS VERY THIN AND WILL TEAR EASILY   Tuberculin Purified Protein Derivative Hives and Nausea And Vomiting    Other reaction(s): Low blood pressure, Urticaria, Nausea and vomiting   Review of Systems  Unable to perform ROS: Acuity of condition   Physical Exam Vitals and  nursing note reviewed.  Constitutional:      General: He is not in acute distress.    Appearance: He is ill-appearing.  Pulmonary:     Effort: No respiratory distress.  Skin:    General: Skin is warm and dry.     Findings: Ecchymosis present.  Neurological:     Mental Status: He is lethargic.     Motor: Weakness present.    Vital Signs: BP (!) 137/41 (BP Location: Left Arm)   Pulse (!) 36   Temp (!) 101 F (38.3 C) (Rectal)   Resp (!) 24   SpO2 95%  Pain Scale: 0-10   Pain Score: 0-No pain   SpO2: SpO2: 95 % O2 Device:SpO2: 95 % O2 Flow Rate: .O2 Flow Rate (L/min): 2 L/min  IO: Intake/output summary:  Intake/Output Summary (Last 24 hours) at 01-21-21 1116 Last data filed at 2021-01-21 0636 Gross per 24 hour  Intake 1901.31 ml  Output --  Net 1901.31 ml    LBM:   Baseline Weight:   Most recent weight:       Palliative Assessment/Data: PPS 10%     Time In: 1115 Time Out: 1205 Time Total: 50 minutes  Greater than 50%  of this time was spent counseling and coordinating care related to the above assessment and plan.  Signed by: Lin Landsman, NP   Please contact Palliative Medicine Team phone at [phone] for questions and concerns.  For individual provider: See Shea Evans

## 2021-01-25 NOTE — Death Summary Note (Signed)
  DEATH SUMMARY   Patient Details  Name: Lawrence Warner MRN: [id number] DOB: 16-May-1918  Admission/Discharge Information   Admit Date:  01-15-21  Date of Death: Date of Death: 01/16/2021  Time of Death: Time of Death: 05-30-2018  Length of Stay: 1  Code Status:   Referring Physician: Wenda Low, MD    Reason(s) for Hospitalization  End of life care  Diagnoses  Preliminary cause of death:  Secondary Diagnoses (including complications and co-morbidities):  Principal Problem:   Admission for end of life care Active Problems:   Pneumonia due to COVID-19 virus   Fall at nursing home, initial encounter   Tachypnea   Palliative care by specialist   DNR (do not resuscitate)   DNI (do not intubate)   Terminal care    Brief Hospital Course  Lawrence Warner is a 87 y.o. year old male who with medical history significant of BPH; CAD s/p CABG; DM; HTN; ITP; stage 3b CKD; COPD on 2-3L home O2; and HLD presenting with a fall.  He was previously hospitalized from 10/27-11/3 with a COPD exacerbation and COVID infection.  He was at high aspiration risk per speech and recommendation was for transition to comfort care if he had further decline.  On admission, he reported that he "feels awful" and provided no other meaningful history.  I spoke with his daughter-in-law.  He has been in rehab and has not improved.  CXR yesterday was c/w PNA.  He has fallen twice since he has been there.  He has a DNR and has talked about wanting to die.  He was so miserable he can't really do anything.  He was admitted for comfort measures.  He expired the night of admission and family was notified by the RN.      Karmen Bongo 01/08/2021, 7:20 AM

## 2021-01-25 NOTE — Progress Notes (Signed)
Daughter in law called and updated.

## 2021-01-25 NOTE — H&P (Signed)
History and Physical    Lawrence Warner VFI:[id number] DOB: June 21, 1918 DOA: 01/20/2021  PCP: Wenda Low, MD Consultants:  Palliative care Patient coming from: [facility] NOK: Jaydian, [place]ana, [phone]; [name], [phone]  Chief Complaint: Fall  HPI: Lawrence Warner is a 87 y.o. male with medical history significant of BPH; CAD s/p CABG; DM; HTN; ITP; stage 3b CKD; COPD on 2-3L home O2; and HLD presenting with a fall.  He was previously hospitalized from 10/27-11/3 with a COPD exacerbation and COVID infection.  He was at high aspiration risk per speech and recommendation was for transition to comfort care if he had further decline.  He reports that he "feels awful" and provided no other meaningful history.  I spoke with his daughter-in-law.  He has been in rehab and has not improved.  CXR yesterday was c/w PNA.  He has fallen twice since he has been there, including last night.  He has a DNR and has talked about wanting to die.  He is so miserable he can't really do anything.  The goal is for comfort.  He would only want comfort measures at this point.      ED Course: Carryover, per Dr. Alcario Drought:  Dene Gentry, pt just admitted 1 week ago.  Back after fall and ongoing breathing difficulties.  Has multifocal PNA.  Had COVID at middle / end of Oct.  EDP thinks this is more aspiration vs HCAP.  Review of Systems:  Unable to perform  Ambulatory Status:  Non-ambulatory at this time  COVID Vaccine Status:  Complete  Past Medical History:  Diagnosis Date   Basal cell carcinoma 10/23/2017   LEFT TEMPLE CX3 5FU   BPH (benign prostatic hypertrophy)    Chronic heart failure (HCC)    Chronic renal insufficiency    Coronary artery disease    CABG in 2000   Diabetes type 2, controlled ([place])    Diet-controlled   Diabetic neuropathy (HCC)    Dyslipidemia    Hypertension    ITP (idiopathic thrombocytopenic purpura)    Osteoarthritis    Pneumonia    SCC (squamous  cell carcinoma) 10/23/2017   RIGHT SIDEBURN TX=CX3 5FU CAUTERY   SCC (squamous cell carcinoma) 04/23/2018   RIGHT JAWLINE TX=EXC   Squamous cell carcinoma of skin 08/17/2015   LEFT HAND CX3 5FU CAUTERY    Past Surgical History:  Procedure Laterality Date   CHOLECYSTECTOMY     CORONARY ARTERY BYPASS GRAFT  2000   Right hip replacement  2014   TONSILLECTOMY      Social History   Socioeconomic History   Marital status: Widowed    Spouse name: Not on file   Number of children: Not on file   Years of education: Not on file   Highest education level: Not on file  Occupational History   Occupation: Salesman  Tobacco Use   Smoking status: Former    Packs/day: 2.00    Years: 25.00    Pack years: 50.00    Types: Cigarettes   Smokeless tobacco: Never  Vaping Use   Vaping Use: Never used  Substance and Sexual Activity   Alcohol use: No   Drug use: No   Sexual activity: Not Currently  Other Topics Concern   Not on file  Social History Narrative    Two children.     Social Determinants of Health   Financial Resource Strain: Not on file  Food Insecurity: Not on file  Transportation Needs: Not on file  Physical Activity:  Not on file  Stress: Not on file  Social Connections: Not on file  Intimate Partner Violence: Not on file    Allergies  Allergen Reactions   Adenosine     Other reaction(s): Heart block   Alfuzosin Hcl Er Other (See Comments)    Confusion/Uroxatral    Ciprofloxacin Nausea Only    Other reaction(s): Nausea, Dizziness, Blurring of visual image, Nausea, Dizziness, Blurring of visual image   Imdur [Isosorbide Dinitrate] Other (See Comments)    Unknown reaction   Lisinopril     Other reaction(s): COUGHING   Niacin     Other reaction(s): Muscle pain   Penicillins Hives and Other (See Comments)    Has patient had a PCN reaction causing immediate rash, facial/tongue/throat swelling, SOB or lightheadedness with hypotension: Yes Has patient had a PCN  reaction causing severe rash involving mucus membranes or skin necrosis: Unknown Has patient had a PCN reaction that required hospitalization: Unknown Has patient had a PCN reaction occurring within the last 10 years: Unknown If all of the above answers are "NO", then may proceed with Cephalosporin use.    Tape Other (See Comments)    SKIN IS VERY THIN AND WILL TEAR EASILY   Tuberculin Purified Protein Derivative Hives and Nausea And Vomiting    Other reaction(s): Low blood pressure, Urticaria, Nausea and vomiting    Family History  Problem Relation Age of Onset   Lung disease Father    Cancer Brother     Prior to Admission medications   Medication Sig Start Date End Date Taking? Authorizing Provider  acetaminophen (TYLENOL) 325 MG tablet Take 650 mg by mouth every 6 (six) hours as needed for moderate pain.   Yes [provider]  amLODipine (NORVASC) 10 MG tablet Take 1 tablet (10 mg total) by mouth daily. 12/28/20  Yes Eulogio Bear U, DO  ascorbic acid (VITAMIN C) 500 MG tablet Take 1 tablet (500 mg total) by mouth daily. 12/28/20  Yes Geradine Girt, DO  aspirin EC 81 MG tablet Take 81 mg by mouth 2 (two) times a week. Sunday's and Wednesday's   Yes [provider]  bisacodyl (DULCOLAX) 10 MG suppository as needed. If not relieved by MOM, give 10 mg Bisacodyl suppositiory rectally X 1 dose in 24 hours as needed   Yes [provider]  cefTRIAXone (ROCEPHIN) 1 g injection Inject 1 g into the muscle See admin instructions. Qd x 7 days   Yes [provider]  Cyanocobalamin (VITAMIN B12 PO) Take 1,000 mcg by mouth daily.   Yes [provider]  cycloSPORINE (RESTASIS) 0.05 % ophthalmic emulsion Place 1 drop into both eyes every 12 (twelve) hours.    Yes [provider]  Dextromethorphan-guaiFENesin (TUSSIN DM PO) Take 10 mLs by mouth See admin instructions. Every 6 hours x 2 weeks   Yes [provider]  furosemide (LASIX) 20 MG  tablet Take 20 mg by mouth daily.   Yes [provider]  guaiFENesin-dextromethorphan (ROBITUSSIN DM) 100-10 MG/5ML syrup Take 10 mLs by mouth every 4 (four) hours as needed for cough. 12/28/20  Yes Eulogio Bear U, DO  hydrOXYzine (ATARAX/VISTARIL) 25 MG tablet Take 25 mg by mouth See admin instructions. At bedtime as needed for sleep or itching x 2 weeks 06/02/20  Yes [provider]  Insulin Aspart (NOVOLOG FLEXPEN [place]) Inject 5 Units into the skin 2 (two) times daily as needed (cbg greater than or equal to 200).   Yes [provider]  ipratropium-albuterol (DUONEB) 0.5-2.5 (3) MG/3ML SOLN Take 3 mLs by nebulization every 2 (two) hours as needed. 12/28/20  Yes Vann, Jessica U, DO  ipratropium-albuterol (DUONEB) 0.5-2.5 (3) MG/3ML SOLN Take 3 mLs by nebulization See admin instructions. Every 6 hours x 2 weeks   Yes [provider]  Magnesium Hydroxide (MILK OF MAGNESIA PO) If no BM in 3 days, give 30 cc Milk of Magnesium p.o. x 1 dose in 24 hours as needed   Yes [provider]  NON FORMULARY Take 1 Dose by mouth 3 (three) times daily. Magic cup   Yes [provider]  OXYGEN Inhale 2 L into the lungs continuous.   Yes [provider]  saccharomyces boulardii (FLORASTOR) 250 MG capsule Take 1 capsule (250 mg total) by mouth 2 (two) times daily for 13 days. 01/13/2021 01/16/21 Yes Medina-Vargas, Monina C, NP  Sodium Phosphates (RA SALINE ENEMA RE) Place rectally. If not relieved by Biscodyl suppository, give disposable Saline Enema rectally X 1 dose/24 hrs as needed   Yes [provider]  zinc sulfate 220 (50 Zn) MG capsule Take 1 capsule (220 mg total) by mouth daily. 12/28/20  Yes Eulogio Bear U, DO  doxycycline (MONODOX) 100 MG capsule Take 1 capsule (100 mg total) by mouth 2 (two) times daily for 10 days. Patient not taking: No sig reported 01/06/2021 01/13/21  Medina-Vargas, Monina C, NP  Nutritional Supplements (ENSURE ORIGINAL PO) Take  237 mLs by mouth 3 (three) times daily. Patient not taking: Reported on 02/01/21    [provider]    Physical Exam: Vitals:   02-01-2021 0730 02/01/2021 0800 02/01/2021 0830 2021-02-01 0840  BP: 139/82 128/62 (!) 134/44   Pulse: 66 64 69 (!) 35  Resp:      Temp:      TempSrc:      SpO2: 90% 100% 91% 100%     General:  Appears acutely ill and quite frail Eyes:  PERRL, EOMI, normal lids, iris ENT:  dry lips & tongue, dry mm Neck:  no LAD, masses or thyromegaly Cardiovascular:  Irregularly irregular with bradycardia. Respiratory:   CTA bilaterally with no wheezes/rales/rhonchi.  Normal respiratory effort. Abdomen:  soft, NT, ND Skin:  no rash or induration seen on limited exam, scattered abrasions Musculoskeletal:  no bony abnormality Psychiatric:  flat mood and affect, speech minimal Neurologic:  unable to perform    Radiological Exams on Admission: Independently reviewed - see discussion in A/P where applicable  CT Head Wo Contrast  Result Date: 02/01/21 CLINICAL DATA:  Unwitnessed fall, hematoma to right forehead EXAM: CT HEAD WITHOUT CONTRAST CT MAXILLOFACIAL WITHOUT CONTRAST CT CERVICAL SPINE WITHOUT CONTRAST TECHNIQUE: Multidetector CT imaging of the head, cervical spine, and maxillofacial structures were performed using the standard protocol without intravenous contrast. Multiplanar CT image reconstructions of the cervical spine and maxillofacial structures were also generated. COMPARISON:  CT head dated 09/19/2020. CT head/cervical spine dated 05/17/2017. FINDINGS: CT HEAD FINDINGS Motion degraded images. Brain: No evidence of acute infarction, hemorrhage, hydrocephalus, extra-axial collection or mass lesion/mass effect. Global cortical atrophy. Subcortical white matter and periventricular small vessel ischemic changes. Vascular: Intracranial atherosclerosis. Skull: Normal. Negative for fracture or focal lesion. Other: None. CT MAXILLOFACIAL FINDINGS Motion degraded  images. Osseous: No evidence of maxillofacial fracture mandible is intact. Bilateral mandibular condyles well-seated in the TMJs Orbits: Bilateral orbits, including the globes and retroconal soft tissues, are within normal limits Sinuses: Partial opacification of the left frontal sinus. Visualized paranasal sinuses and mastoid air cells  are otherwise clear. Soft tissues: Negative. CT CERVICAL SPINE FINDINGS Alignment: Exaggerated cervical lordosis. Skull base and vertebrae: No acute fracture. No primary bone lesion or focal pathologic process. Soft tissues and spinal canal: No prevertebral fluid or swelling. No visible canal hematoma. Disc levels: Bridging osteophytosis with moderate multilevel degenerative changes. Calcification of the posterior longitudinal ligament. Spinal canal is patent. Upper chest: Visualized lungs are notable for mild biapical pleural-parenchymal scarring Other: Visualized thyroid is unremarkable. IMPRESSION: Motion degraded images. No evidence of acute intracranial abnormality. Atrophy with small vessel ischemic changes. No evidence of maxillofacial fracture. No evidence of traumatic injury to the cervical spine. Moderate multilevel degenerative changes. Electronically Signed   By: Julian Hy M.D.   On: 2021-01-08 03:33   CT Cervical Spine Wo Contrast  Result Date: 01/08/21 CLINICAL DATA:  Unwitnessed fall, hematoma to right forehead EXAM: CT HEAD WITHOUT CONTRAST CT MAXILLOFACIAL WITHOUT CONTRAST CT CERVICAL SPINE WITHOUT CONTRAST TECHNIQUE: Multidetector CT imaging of the head, cervical spine, and maxillofacial structures were performed using the standard protocol without intravenous contrast. Multiplanar CT image reconstructions of the cervical spine and maxillofacial structures were also generated. COMPARISON:  CT head dated 09/19/2020. CT head/cervical spine dated 05/17/2017. FINDINGS: CT HEAD FINDINGS Motion degraded images. Brain: No evidence of acute infarction,  hemorrhage, hydrocephalus, extra-axial collection or mass lesion/mass effect. Global cortical atrophy. Subcortical white matter and periventricular small vessel ischemic changes. Vascular: Intracranial atherosclerosis. Skull: Normal. Negative for fracture or focal lesion. Other: None. CT MAXILLOFACIAL FINDINGS Motion degraded images. Osseous: No evidence of maxillofacial fracture mandible is intact. Bilateral mandibular condyles well-seated in the TMJs Orbits: Bilateral orbits, including the globes and retroconal soft tissues, are within normal limits Sinuses: Partial opacification of the left frontal sinus. Visualized paranasal sinuses and mastoid air cells are otherwise clear. Soft tissues: Negative. CT CERVICAL SPINE FINDINGS Alignment: Exaggerated cervical lordosis. Skull base and vertebrae: No acute fracture. No primary bone lesion or focal pathologic process. Soft tissues and spinal canal: No prevertebral fluid or swelling. No visible canal hematoma. Disc levels: Bridging osteophytosis with moderate multilevel degenerative changes. Calcification of the posterior longitudinal ligament. Spinal canal is patent. Upper chest: Visualized lungs are notable for mild biapical pleural-parenchymal scarring Other: Visualized thyroid is unremarkable. IMPRESSION: Motion degraded images. No evidence of acute intracranial abnormality. Atrophy with small vessel ischemic changes. No evidence of maxillofacial fracture. No evidence of traumatic injury to the cervical spine. Moderate multilevel degenerative changes. Electronically Signed   By: Julian Hy M.D.   On: 2021-01-08 03:33   DG Chest Portable 1 View  Result Date: 2021/01/08 CLINICAL DATA:  Fall, COVID EXAM: PORTABLE CHEST 1 VIEW COMPARISON:  12/25/2020 FINDINGS: Multifocal patchy opacities in the bilateral perihilar regions and lower lobes, compatible with multifocal pneumonia. Possible small left pleural effusion. No pneumothorax. The heart is normal in size.  Postsurgical changes related to prior CABG. Median sternotomy. IMPRESSION: Multifocal pneumonia in this patient with known COVID. Possible small left pleural effusion. Electronically Signed   By: Julian Hy M.D.   On: 01/08/21 00:35   CT Maxillofacial Wo Contrast  Result Date: 08-Jan-2021 CLINICAL DATA:  Unwitnessed fall, hematoma to right forehead EXAM: CT HEAD WITHOUT CONTRAST CT MAXILLOFACIAL WITHOUT CONTRAST CT CERVICAL SPINE WITHOUT CONTRAST TECHNIQUE: Multidetector CT imaging of the head, cervical spine, and maxillofacial structures were performed using the standard protocol without intravenous contrast. Multiplanar CT image reconstructions of the cervical spine and maxillofacial structures were also generated. COMPARISON:  CT head dated 09/19/2020. CT head/cervical spine dated 05/17/2017. FINDINGS:  CT HEAD FINDINGS Motion degraded images. Brain: No evidence of acute infarction, hemorrhage, hydrocephalus, extra-axial collection or mass lesion/mass effect. Global cortical atrophy. Subcortical white matter and periventricular small vessel ischemic changes. Vascular: Intracranial atherosclerosis. Skull: Normal. Negative for fracture or focal lesion. Other: None. CT MAXILLOFACIAL FINDINGS Motion degraded images. Osseous: No evidence of maxillofacial fracture mandible is intact. Bilateral mandibular condyles well-seated in the TMJs Orbits: Bilateral orbits, including the globes and retroconal soft tissues, are within normal limits Sinuses: Partial opacification of the left frontal sinus. Visualized paranasal sinuses and mastoid air cells are otherwise clear. Soft tissues: Negative. CT CERVICAL SPINE FINDINGS Alignment: Exaggerated cervical lordosis. Skull base and vertebrae: No acute fracture. No primary bone lesion or focal pathologic process. Soft tissues and spinal canal: No prevertebral fluid or swelling. No visible canal hematoma. Disc levels: Bridging osteophytosis with moderate multilevel  degenerative changes. Calcification of the posterior longitudinal ligament. Spinal canal is patent. Upper chest: Visualized lungs are notable for mild biapical pleural-parenchymal scarring Other: Visualized thyroid is unremarkable. IMPRESSION: Motion degraded images. No evidence of acute intracranial abnormality. Atrophy with small vessel ischemic changes. No evidence of maxillofacial fracture. No evidence of traumatic injury to the cervical spine. Moderate multilevel degenerative changes. Electronically Signed   By: Julian Hy M.D.   On: 2021-01-08 03:33    EKG: Independently reviewed.  Afib with rate 61; LBBB, unchanged from prior   Labs on Admission: I have personally reviewed the available labs and imaging studies at the time of the admission.  Pertinent labs:   Glucose 156 BUN 55/Creatinine 2.06/GFR 28; 74/1.52/41 on 11/2 Lactate 2.1, 1.7 WBC 23.0 Hgb 10.7 Platelets 143 COVID POSITIVE on 10/27 - still in window   Assessment/Plan Principal Problem:   Admission for end of life care Active Problems:   Pneumonia due to COVID-19 virus   Fall at nursing home, initial encounter   -Patient presenting with apparent COVID-19 associated PNA, although aspiration PNA is also a consideration -Patient also had 2 falls at the SNF since last week's hospital discharge -After discussion with family, family has decided to proceed with comfort care only -He will be admitted to [facility] for comfort care and palliative care consult -Patient is likely to have an in-hospital demise but may be a candidate for [employer] or other residential hospice if he stabilizes enough for transfer -Comfort care order set utilized -No antibiotics or IVF as per family's request -Pain control with morphine drip     DVT prophylaxis: None - comfort measures Code Status: DNR - confirmed with family Family Communication: I spoke with his DIL by telephone at the time of admission Disposition Plan: Anticipate  in-hospital death Consults called: Palliative care Admission status: Admit - It is my clinical opinion that admission to INPATIENT is reasonable and necessary because of the expectation that this patient will require hospital care that crosses at least 2 midnights to treat this condition based on the medical complexity of the problems presented.  Given the aforementioned information, the predictability of an adverse outcome is felt to be significant.    Karmen Bongo MD [facility]   How to contact the [facility] Attending or Consulting provider [place] or covering provider during after hours [place], for this patient?  Check the care team in [facility] and look for a) attending/consulting [facility] provider listed and b) the [facility] team listed Log into [email or web address] and use [place]'s universal password to access. If you do not have the password, please contact the hospital operator.  Locate the [facility] provider you are looking for under [facility] and page to a number that you can be directly reached. If you still have difficulty reaching the provider, please page the [facility] (Director on Call) for the Hospitalists listed on amion for assistance.   January 17, 2021, 10:13 AM

## 2021-01-25 NOTE — ED Notes (Signed)
Pt's daughter in law called. Req call back with update. [name] Schlemmer [phone]

## 2021-01-25 NOTE — ED Notes (Addendum)
Dr Lorin Mercy aware of HR and respiratory status with rhonchi noted. Will call family. Patient positioned for comfort. Remains alert, responsive, MAEE, follows commands.

## 2021-01-25 NOTE — ED Provider Notes (Signed)
[name] EMERGENCY DEPARTMENT Provider Note   CSN: [id number] Arrival date & time: 12/26/2020  2346     History Chief Complaint  Patient presents with   Lawrence Warner is a 87 y.o. male.   Fall This is a recurrent problem. The current episode started 1 to 2 hours ago. The problem occurs constantly. The problem has not changed since onset.Associated symptoms include headaches. Pertinent negatives include no chest pain. Nothing aggravates the symptoms. Nothing relieves the symptoms. He has tried nothing for the symptoms. The treatment provided no relief.      Past Medical History:  Diagnosis Date   Basal cell carcinoma 10/23/2017   LEFT TEMPLE CX3 5FU   BPH (benign prostatic hypertrophy)    Chronic heart failure (HCC)    Chronic renal insufficiency    Coronary artery disease    CABG in 2000   Diabetes type 2, controlled ([place])    Diet-controlled   Diabetic neuropathy (HCC)    Dyslipidemia    Hypertension    ITP (idiopathic thrombocytopenic purpura)    Osteoarthritis    Pneumonia    SCC (squamous cell carcinoma) 10/23/2017   RIGHT SIDEBURN TX=CX3 5FU CAUTERY   SCC (squamous cell carcinoma) 04/23/2018   RIGHT JAWLINE TX=EXC   Squamous cell carcinoma of skin 08/17/2015   LEFT HAND CX3 5FU CAUTERY    Patient Active Problem List   Diagnosis Date Noted   COVID-19 virus infection 12/21/2020   Elevated troponin 12/21/2020   (HFpEF) heart failure with preserved ejection fraction ([place]) 08/09/2020   Aspiration pneumonia ([place]) 08/09/2020   Arrhythmia 08/09/2020   Leg swelling 03/10/2019   Acute renal failure superimposed on stage 3b chronic kidney disease ([place]) 03/10/2019   Educated about COVID-19 virus infection 03/10/2019   Dyspnea and respiratory abnormalities 01/10/2019   Acute on chronic combined systolic and diastolic heart failure ([place]) 11/23/2018   Acute exacerbation of CHF (congestive heart failure) ([place]) 11/22/2018   CAD (coronary  artery disease) 11/22/2018   QT prolongation 11/22/2018   CHF (congestive heart failure) ([place]) 11/12/2018   Coronary artery disease involving native coronary artery of native heart without angina pectoris 01/08/2018   Generalized weakness 05/17/2017   Hypertensive emergency 05/17/2017   RSV bronchitis    Bronchospasm with bronchitis, acute 03/14/2017   Acute on chronic diastolic CHF (congestive heart failure) ([place]) 11/26/2016   LBBB (left bundle branch block) 11/26/2016   Hypoxia    Acute on chronic respiratory failure with hypoxia ([place]) 07/20/2016   Chronic respiratory failure with hypoxia ([place]) 02/28/2016   Dyslipidemia 02/28/2016   Sepsis ([place])    Constipation 12/07/2015   Bradycardia 11/28/2015   Port catheter in place 10/24/2015   GERD (gastroesophageal reflux disease) 10/09/2015   Gait instability 10/09/2015   Chest pain 10/05/2015   Diet-controlled diabetes mellitus ([place]) 05/07/2015   Hx of CABG 05/07/2015   Essential hypertension 05/07/2015   Muscle cramps 04/25/2015   Shortness of breath 04/11/2015   Idiopathic thrombocytopenic purpura ([place]) 09/27/2014    Past Surgical History:  Procedure Laterality Date   CHOLECYSTECTOMY     CORONARY ARTERY BYPASS GRAFT  2000   Right hip replacement  2014   TONSILLECTOMY         Family History  Problem Relation Age of Onset   Lung disease Father    Cancer Brother     Social History   Tobacco Use   Smoking status: Former    Packs/day: 2.00    Years:  25.00    Pack years: 50.00    Types: Cigarettes   Smokeless tobacco: Never  Vaping Use   Vaping Use: Never used  Substance Use Topics   Alcohol use: No   Drug use: No    Home Medications Prior to Admission medications   Medication Sig Start Date End Date Taking? Authorizing Provider  acetaminophen (TYLENOL) 325 MG tablet Take 650 mg by mouth every 6 (six) hours as needed for moderate pain.   Yes [provider]  amLODipine (NORVASC) 10 MG tablet Take 1  tablet (10 mg total) by mouth daily. 12/28/20  Yes Eulogio Bear U, DO  ascorbic acid (VITAMIN C) 500 MG tablet Take 1 tablet (500 mg total) by mouth daily. 12/28/20  Yes Geradine Girt, DO  aspirin EC 81 MG tablet Take 81 mg by mouth 2 (two) times a week. Sunday's and Wednesday's   Yes [provider]  bisacodyl (DULCOLAX) 10 MG suppository as needed. If not relieved by MOM, give 10 mg Bisacodyl suppositiory rectally X 1 dose in 24 hours as needed   Yes [provider]  cefTRIAXone (ROCEPHIN) 1 g injection Inject 1 g into the muscle See admin instructions. Qd x 7 days   Yes [provider]  Cyanocobalamin (VITAMIN B12 PO) Take 1,000 mcg by mouth daily.   Yes [provider]  cycloSPORINE (RESTASIS) 0.05 % ophthalmic emulsion Place 1 drop into both eyes every 12 (twelve) hours.    Yes [provider]  Dextromethorphan-guaiFENesin (TUSSIN DM PO) Take 10 mLs by mouth See admin instructions. Every 6 hours x 2 weeks   Yes [provider]  furosemide (LASIX) 20 MG tablet Take 20 mg by mouth daily.   Yes [provider]  guaiFENesin-dextromethorphan (ROBITUSSIN DM) 100-10 MG/5ML syrup Take 10 mLs by mouth every 4 (four) hours as needed for cough. 12/28/20  Yes Eulogio Bear U, DO  hydrOXYzine (ATARAX/VISTARIL) 25 MG tablet Take 25 mg by mouth See admin instructions. At bedtime as needed for sleep or itching x 2 weeks 06/02/20  Yes [provider]  Insulin Aspart (NOVOLOG FLEXPEN Helper) Inject 5 Units into the skin 2 (two) times daily as needed (cbg greater than or equal to 200).   Yes [provider]  ipratropium-albuterol (DUONEB) 0.5-2.5 (3) MG/3ML SOLN Take 3 mLs by nebulization every 2 (two) hours as needed. 12/28/20  Yes Vann, Jessica U, DO  ipratropium-albuterol (DUONEB) 0.5-2.5 (3) MG/3ML SOLN Take 3 mLs by nebulization See admin instructions. Every 6 hours x 2 weeks   Yes [provider]  Magnesium Hydroxide (MILK OF  MAGNESIA PO) If no BM in 3 days, give 30 cc Milk of Magnesium p.o. x 1 dose in 24 hours as needed   Yes [provider]  NON FORMULARY Take 1 Dose by mouth 3 (three) times daily. Magic cup   Yes [provider]  OXYGEN Inhale 2 L into the lungs continuous.   Yes [provider]  saccharomyces boulardii (FLORASTOR) 250 MG capsule Take 1 capsule (250 mg total) by mouth 2 (two) times daily for 13 days. 01/01/2021 01/16/21 Yes Medina-Vargas, Monina C, NP  Sodium Phosphates (RA SALINE ENEMA RE) Place rectally. If not relieved by Biscodyl suppository, give disposable Saline Enema rectally X 1 dose/24 hrs as needed   Yes [provider]  zinc sulfate 220 (50 Zn) MG capsule Take 1 capsule (220 mg total) by mouth daily. 12/28/20  Yes Vann, Jessica U, DO  doxycycline ([place]) 100  MG capsule Take 1 capsule (100 mg total) by mouth 2 (two) times daily for 10 days. Patient not taking: No sig reported 12/26/2020 01/13/21  Medina-Vargas, Monina C, NP  Nutritional Supplements (ENSURE ORIGINAL PO) Take 237 mLs by mouth 3 (three) times daily. Patient not taking: Reported on 2021/01/25    [provider]    Allergies    Adenosine, Alfuzosin hcl er, Ciprofloxacin, Imdur [isosorbide dinitrate], Lisinopril, Niacin, Penicillins, Tape, and Tuberculin purified protein derivative  Review of Systems   Review of Systems  Unable to perform ROS: Other  Cardiovascular:  Negative for chest pain.  Neurological:  Positive for headaches.   Physical Exam Updated Vital Signs BP (!) 135/43   Pulse (!) 59   Temp (!) 101 F (38.3 C) (Rectal)   Resp (!) 24   SpO2 100%   Physical Exam Vitals and nursing note reviewed.  Constitutional:      Appearance: He is well-developed.  HENT:     Head: Normocephalic and atraumatic.     Nose: No congestion or rhinorrhea.     Mouth/Throat:     Mouth: Mucous membranes are moist.  Eyes:     Pupils: Pupils are equal, round, and reactive to light.   Cardiovascular:     Rate and Rhythm: Normal rate.  Pulmonary:     Effort: Pulmonary effort is normal. Tachypnea present. No respiratory distress.     Breath sounds: Rhonchi present.  Abdominal:     General: There is no distension.     Tenderness: There is no abdominal tenderness.  Musculoskeletal:        General: No swelling or tenderness. Normal range of motion.     Cervical back: Normal range of motion.  Neurological:     Mental Status: He is alert.    ED Results / Procedures / Treatments   Labs (all labs ordered are listed, but only abnormal results are displayed) Labs Reviewed  CBC WITH DIFFERENTIAL/PLATELET - Abnormal; Notable for the following components:      Result Value   WBC 23.0 (*)    RBC 3.61 (*)    Hemoglobin 10.7 (*)    HCT 34.1 (*)    Platelets 143 (*)    Neutro Abs 21.1 (*)    Lymphs Abs 0.3 (*)    Monocytes Absolute 1.3 (*)    Abs Immature Granulocytes 0.19 (*)    All other components within normal limits  COMPREHENSIVE METABOLIC PANEL - Abnormal; Notable for the following components:   Glucose, Bld 156 (*)    BUN 55 (*)    Creatinine, Ser 2.06 (*)    Calcium 8.4 (*)    Total Protein 5.9 (*)    Albumin 2.2 (*)    GFR, Estimated 28 (*)    All other components within normal limits  LACTIC ACID, PLASMA - Abnormal; Notable for the following components:   Lactic Acid, Venous 2.1 (*)    All other components within normal limits  CULTURE, BLOOD (ROUTINE X 2)  CULTURE, BLOOD (ROUTINE X 2)  URINE CULTURE  RESP PANEL BY RT-PCR (FLU A&B, COVID) ARPGX2  URINALYSIS, ROUTINE W REFLEX MICROSCOPIC  LACTIC ACID, PLASMA    EKG EKG Interpretation  Date/Time:  Jan 25, 2021 00:27:40 EST Ventricular Rate:  61 PR Interval:    QRS Duration: 162 QT Interval:  475 QTC Calculation: 479 R Axis:   -66 Text Interpretation: Atrial fibrillation Left bundle branch block Confirmed by Merrily Pew [phone]) on Jan 25, 2021 1:41:18 AM  Radiology  CT Head Wo  Contrast  Result Date: 01/24/21 CLINICAL DATA:  Unwitnessed fall, hematoma to right forehead EXAM: CT HEAD WITHOUT CONTRAST CT MAXILLOFACIAL WITHOUT CONTRAST CT CERVICAL SPINE WITHOUT CONTRAST TECHNIQUE: Multidetector CT imaging of the head, cervical spine, and maxillofacial structures were performed using the standard protocol without intravenous contrast. Multiplanar CT image reconstructions of the cervical spine and maxillofacial structures were also generated. COMPARISON:  CT head dated 09/19/2020. CT head/cervical spine dated 05/17/2017. FINDINGS: CT HEAD FINDINGS Motion degraded images. Brain: No evidence of acute infarction, hemorrhage, hydrocephalus, extra-axial collection or mass lesion/mass effect. Global cortical atrophy. Subcortical white matter and periventricular small vessel ischemic changes. Vascular: Intracranial atherosclerosis. Skull: Normal. Negative for fracture or focal lesion. Other: None. CT MAXILLOFACIAL FINDINGS Motion degraded images. Osseous: No evidence of maxillofacial fracture mandible is intact. Bilateral mandibular condyles well-seated in the TMJs Orbits: Bilateral orbits, including the globes and retroconal soft tissues, are within normal limits Sinuses: Partial opacification of the left frontal sinus. Visualized paranasal sinuses and mastoid air cells are otherwise clear. Soft tissues: Negative. CT CERVICAL SPINE FINDINGS Alignment: Exaggerated cervical lordosis. Skull base and vertebrae: No acute fracture. No primary bone lesion or focal pathologic process. Soft tissues and spinal canal: No prevertebral fluid or swelling. No visible canal hematoma. Disc levels: Bridging osteophytosis with moderate multilevel degenerative changes. Calcification of the posterior longitudinal ligament. Spinal canal is patent. Upper chest: Visualized lungs are notable for mild biapical pleural-parenchymal scarring Other: Visualized thyroid is unremarkable. IMPRESSION: Motion degraded images. No  evidence of acute intracranial abnormality. Atrophy with small vessel ischemic changes. No evidence of maxillofacial fracture. No evidence of traumatic injury to the cervical spine. Moderate multilevel degenerative changes. Electronically Signed   By: Julian Hy M.D.   On: 01-24-21 03:33   CT Cervical Spine Wo Contrast  Result Date: 01-24-2021 CLINICAL DATA:  Unwitnessed fall, hematoma to right forehead EXAM: CT HEAD WITHOUT CONTRAST CT MAXILLOFACIAL WITHOUT CONTRAST CT CERVICAL SPINE WITHOUT CONTRAST TECHNIQUE: Multidetector CT imaging of the head, cervical spine, and maxillofacial structures were performed using the standard protocol without intravenous contrast. Multiplanar CT image reconstructions of the cervical spine and maxillofacial structures were also generated. COMPARISON:  CT head dated 09/19/2020. CT head/cervical spine dated 05/17/2017. FINDINGS: CT HEAD FINDINGS Motion degraded images. Brain: No evidence of acute infarction, hemorrhage, hydrocephalus, extra-axial collection or mass lesion/mass effect. Global cortical atrophy. Subcortical white matter and periventricular small vessel ischemic changes. Vascular: Intracranial atherosclerosis. Skull: Normal. Negative for fracture or focal lesion. Other: None. CT MAXILLOFACIAL FINDINGS Motion degraded images. Osseous: No evidence of maxillofacial fracture mandible is intact. Bilateral mandibular condyles well-seated in the TMJs Orbits: Bilateral orbits, including the globes and retroconal soft tissues, are within normal limits Sinuses: Partial opacification of the left frontal sinus. Visualized paranasal sinuses and mastoid air cells are otherwise clear. Soft tissues: Negative. CT CERVICAL SPINE FINDINGS Alignment: Exaggerated cervical lordosis. Skull base and vertebrae: No acute fracture. No primary bone lesion or focal pathologic process. Soft tissues and spinal canal: No prevertebral fluid or swelling. No visible canal hematoma. Disc  levels: Bridging osteophytosis with moderate multilevel degenerative changes. Calcification of the posterior longitudinal ligament. Spinal canal is patent. Upper chest: Visualized lungs are notable for mild biapical pleural-parenchymal scarring Other: Visualized thyroid is unremarkable. IMPRESSION: Motion degraded images. No evidence of acute intracranial abnormality. Atrophy with small vessel ischemic changes. No evidence of maxillofacial fracture. No evidence of traumatic injury to the cervical spine. Moderate multilevel degenerative changes. Electronically Signed   By: Julian Hy  M.D.   On: 01-07-2021 03:33   DG Chest Portable 1 View  Result Date: 01/07/21 CLINICAL DATA:  Fall, COVID EXAM: PORTABLE CHEST 1 VIEW COMPARISON:  12/25/2020 FINDINGS: Multifocal patchy opacities in the bilateral perihilar regions and lower lobes, compatible with multifocal pneumonia. Possible small left pleural effusion. No pneumothorax. The heart is normal in size. Postsurgical changes related to prior CABG. Median sternotomy. IMPRESSION: Multifocal pneumonia in this patient with known COVID. Possible small left pleural effusion. Electronically Signed   By: Julian Hy M.D.   On: 2021-01-07 00:35   CT Maxillofacial Wo Contrast  Result Date: 01/07/2021 CLINICAL DATA:  Unwitnessed fall, hematoma to right forehead EXAM: CT HEAD WITHOUT CONTRAST CT MAXILLOFACIAL WITHOUT CONTRAST CT CERVICAL SPINE WITHOUT CONTRAST TECHNIQUE: Multidetector CT imaging of the head, cervical spine, and maxillofacial structures were performed using the standard protocol without intravenous contrast. Multiplanar CT image reconstructions of the cervical spine and maxillofacial structures were also generated. COMPARISON:  CT head dated 09/19/2020. CT head/cervical spine dated 05/17/2017. FINDINGS: CT HEAD FINDINGS Motion degraded images. Brain: No evidence of acute infarction, hemorrhage, hydrocephalus, extra-axial collection or mass  lesion/mass effect. Global cortical atrophy. Subcortical white matter and periventricular small vessel ischemic changes. Vascular: Intracranial atherosclerosis. Skull: Normal. Negative for fracture or focal lesion. Other: None. CT MAXILLOFACIAL FINDINGS Motion degraded images. Osseous: No evidence of maxillofacial fracture mandible is intact. Bilateral mandibular condyles well-seated in the TMJs Orbits: Bilateral orbits, including the globes and retroconal soft tissues, are within normal limits Sinuses: Partial opacification of the left frontal sinus. Visualized paranasal sinuses and mastoid air cells are otherwise clear. Soft tissues: Negative. CT CERVICAL SPINE FINDINGS Alignment: Exaggerated cervical lordosis. Skull base and vertebrae: No acute fracture. No primary bone lesion or focal pathologic process. Soft tissues and spinal canal: No prevertebral fluid or swelling. No visible canal hematoma. Disc levels: Bridging osteophytosis with moderate multilevel degenerative changes. Calcification of the posterior longitudinal ligament. Spinal canal is patent. Upper chest: Visualized lungs are notable for mild biapical pleural-parenchymal scarring Other: Visualized thyroid is unremarkable. IMPRESSION: Motion degraded images. No evidence of acute intracranial abnormality. Atrophy with small vessel ischemic changes. No evidence of maxillofacial fracture. No evidence of traumatic injury to the cervical spine. Moderate multilevel degenerative changes. Electronically Signed   By: Julian Hy M.D.   On: 2021-01-07 03:33    Procedures Procedures   Medications Ordered in ED Medications  acetaminophen (TYLENOL) suppository 650 mg (has no administration in time range)  lactated ringers bolus 1,000 mL (0 mLs Intravenous Stopped 01-07-21 0212)  vancomycin (VANCOREADY) IVPB 1500 mg/300 mL (0 mg Intravenous Stopped 2021-01-07 0607)  ceFEPIme (MAXIPIME) 2 g in sodium chloride 0.9 % 100 mL IVPB (0 g Intravenous Stopped  07-Jan-2021 0401)  albuterol (VENTOLIN HFA) 108 (90 Base) MCG/ACT inhaler 2 puff (2 puffs Inhalation Given Jan 07, 2021 0518)  lactated ringers bolus 500 mL (0 mLs Intravenous Stopped Jan 07, 2021 0636)    ED Course  I have reviewed the triage vital signs and the nursing notes.  Pertinent labs & imaging results that were available during my care of the patient were reviewed by me and considered in my medical decision making (see chart for details).    MDM Rules/Calculators/A&P                         Here for unwitnessed fall. Found to be tachypneic and wet cough.   Patient with 1 soft blood pressure 54U systolic however when rechecked immediately it was back  in the normal range.  I suspect this was artifact.  Her other blood pressures been well above 100.  Fluids have been given.  Antibiotics have been given to cover for healthcare associate pneumonia.  Discussed with hospitalist, Dr. Alcario Drought, plan for admission.  Final Clinical Impression(s) / ED Diagnoses Final diagnoses:  Fall, initial encounter  Tachypnea    Rx / DC Orders ED Discharge Orders     None        Nikhil Osei, Corene Cornea, MD January 14, 2021 [phone]

## 2021-01-25 NOTE — ED Triage Notes (Signed)
Pt arrived with EMS from [facility] for unwitnessed fall. Hematoma to R forehead. Dx with covid 10/27. EMS reported stridor in upper lobes, rales in lower. Alert x 1 to name only. EMS VS 110/60, pulse 52, cbg 235, resp rate 42, ET 15, placed on simple mask by EMS at 8L

## 2021-01-25 NOTE — Progress Notes (Signed)
Patient expired at 2020, was verified by [facility] (day shift CN). On-call notified, daughter in-law also notified. [place] also notified. Medical examiner office paged but still waiting for their call back. We continue to monitor.

## 2021-01-25 NOTE — ED Notes (Signed)
Provider. Dr Lorin Mercy at bedside.

## 2021-01-25 NOTE — Progress Notes (Signed)
Lawrence Warner [phone] [facility]) Hospital Liaison note:  This is a pending outpatient-based Palliative Care patient.   Will continue to follow for disposition.  Please call with any outpatient palliative questions or concerns.  Thank you, Lorelee Market, LPN [facility] Liaison [phone]

## 2021-01-25 NOTE — ED Notes (Signed)
Mouth care and TLC given.

## 2021-01-25 NOTE — ED Notes (Signed)
Mouth care done. Attempted to sx back of throat for congested cough, but no secretions sx, patient tolerated well.

## 2021-01-25 NOTE — ED Notes (Signed)
Patient transported to CT 

## 2021-01-25 DEATH — deceased
